# Patient Record
Sex: Female | Born: 1950 | Race: White | Hispanic: No | Marital: Single | Smoking: Former smoker
Health system: Southern US, Community
[De-identification: ages and names within clinical notes are randomized; demographics above are authoritative.]

## PROBLEM LIST (undated history)

## (undated) DIAGNOSIS — Z91199 Patient's noncompliance with other medical treatment and regimen due to unspecified reason: Secondary | ICD-10-CM

## (undated) DIAGNOSIS — D6851 Activated protein C resistance: Secondary | ICD-10-CM

## (undated) DIAGNOSIS — Z7901 Long term (current) use of anticoagulants: Secondary | ICD-10-CM

## (undated) DIAGNOSIS — S329XXA Fracture of unspecified parts of lumbosacral spine and pelvis, initial encounter for closed fracture: Secondary | ICD-10-CM

## (undated) DIAGNOSIS — Z720 Tobacco use: Secondary | ICD-10-CM

## (undated) DIAGNOSIS — Z9119 Patient's noncompliance with other medical treatment and regimen: Secondary | ICD-10-CM

## (undated) DIAGNOSIS — I2510 Atherosclerotic heart disease of native coronary artery without angina pectoris: Secondary | ICD-10-CM

## (undated) DIAGNOSIS — E039 Hypothyroidism, unspecified: Secondary | ICD-10-CM

## (undated) DIAGNOSIS — J449 Chronic obstructive pulmonary disease, unspecified: Secondary | ICD-10-CM

## (undated) DIAGNOSIS — J9610 Chronic respiratory failure, unspecified whether with hypoxia or hypercapnia: Secondary | ICD-10-CM

## (undated) DIAGNOSIS — I2699 Other pulmonary embolism without acute cor pulmonale: Secondary | ICD-10-CM

## (undated) DIAGNOSIS — E785 Hyperlipidemia, unspecified: Secondary | ICD-10-CM

## (undated) DIAGNOSIS — I219 Acute myocardial infarction, unspecified: Secondary | ICD-10-CM

## (undated) HISTORY — DX: Atherosclerotic heart disease of native coronary artery without angina pectoris: I25.10

## (undated) HISTORY — PX: COLONOSCOPY: SHX174

## (undated) HISTORY — PX: CORONARY ANGIOPLASTY: SHX604

## (undated) HISTORY — DX: Chronic obstructive pulmonary disease, unspecified: J44.9

## (undated) HISTORY — DX: Hyperlipidemia, unspecified: E78.5

## (undated) HISTORY — DX: Activated protein C resistance: D68.51

## (undated) HISTORY — DX: Tobacco use: Z72.0

## (undated) HISTORY — DX: Fracture of unspecified parts of lumbosacral spine and pelvis, initial encounter for closed fracture: S32.9XXA

## (undated) HISTORY — DX: Other pulmonary embolism without acute cor pulmonale: I26.99

## (undated) HISTORY — DX: Long term (current) use of anticoagulants: Z79.01

## (undated) HISTORY — DX: Hypothyroidism, unspecified: E03.9

---
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
## 1997-10-24 ENCOUNTER — Emergency Department (HOSPITAL_COMMUNITY): Admission: EM | Admit: 1997-10-24 | Discharge: 1997-10-24 | Payer: Self-pay | Admitting: Emergency Medicine

## 1997-11-03 ENCOUNTER — Encounter: Admission: RE | Admit: 1997-11-03 | Discharge: 1998-02-01 | Payer: Self-pay | Admitting: *Deleted

## 1999-06-01 ENCOUNTER — Encounter: Admission: RE | Admit: 1999-06-01 | Discharge: 1999-06-01 | Payer: Self-pay | Admitting: Urology

## 1999-06-01 ENCOUNTER — Encounter: Payer: Self-pay | Admitting: Urology

## 2000-01-10 DIAGNOSIS — I2510 Atherosclerotic heart disease of native coronary artery without angina pectoris: Secondary | ICD-10-CM

## 2000-01-10 HISTORY — DX: Atherosclerotic heart disease of native coronary artery without angina pectoris: I25.10

## 2000-12-24 ENCOUNTER — Encounter: Payer: Self-pay | Admitting: Emergency Medicine

## 2000-12-24 ENCOUNTER — Encounter: Payer: Self-pay | Admitting: Cardiology

## 2000-12-24 ENCOUNTER — Inpatient Hospital Stay (HOSPITAL_COMMUNITY): Admission: EM | Admit: 2000-12-24 | Discharge: 2000-12-27 | Payer: Self-pay | Admitting: Cardiology

## 2001-03-26 ENCOUNTER — Encounter: Admission: RE | Admit: 2001-03-26 | Discharge: 2001-06-24 | Payer: Self-pay | Admitting: Family Medicine

## 2001-05-24 ENCOUNTER — Inpatient Hospital Stay (HOSPITAL_COMMUNITY): Admission: AD | Admit: 2001-05-24 | Discharge: 2001-05-25 | Payer: Self-pay | Admitting: Cardiology

## 2001-05-28 ENCOUNTER — Ambulatory Visit (HOSPITAL_COMMUNITY): Admission: RE | Admit: 2001-05-28 | Discharge: 2001-05-29 | Payer: Self-pay | Admitting: *Deleted

## 2001-06-13 ENCOUNTER — Emergency Department (HOSPITAL_COMMUNITY): Admission: EM | Admit: 2001-06-13 | Discharge: 2001-06-13 | Payer: Self-pay | Admitting: *Deleted

## 2002-11-05 ENCOUNTER — Encounter: Admission: RE | Admit: 2002-11-05 | Discharge: 2002-11-05 | Payer: Self-pay | Admitting: Family Medicine

## 2003-01-01 ENCOUNTER — Ambulatory Visit (HOSPITAL_COMMUNITY): Admission: RE | Admit: 2003-01-01 | Discharge: 2003-01-01 | Payer: Self-pay | Admitting: *Deleted

## 2003-02-06 ENCOUNTER — Inpatient Hospital Stay (HOSPITAL_COMMUNITY): Admission: EM | Admit: 2003-02-06 | Discharge: 2003-02-09 | Payer: Self-pay | Admitting: Internal Medicine

## 2003-12-07 ENCOUNTER — Other Ambulatory Visit: Admission: RE | Admit: 2003-12-07 | Discharge: 2003-12-07 | Payer: Self-pay | Admitting: Family Medicine

## 2003-12-08 ENCOUNTER — Ambulatory Visit: Payer: Self-pay | Admitting: Cardiology

## 2004-01-10 DIAGNOSIS — I2699 Other pulmonary embolism without acute cor pulmonale: Secondary | ICD-10-CM

## 2004-01-10 DIAGNOSIS — D6851 Activated protein C resistance: Secondary | ICD-10-CM

## 2004-01-10 HISTORY — DX: Other pulmonary embolism without acute cor pulmonale: I26.99

## 2004-01-10 HISTORY — DX: Activated protein C resistance: D68.51

## 2004-04-21 ENCOUNTER — Ambulatory Visit: Payer: Self-pay

## 2004-05-31 ENCOUNTER — Ambulatory Visit: Payer: Self-pay | Admitting: Pulmonary Disease

## 2004-05-31 LAB — PULMONARY FUNCTION TEST

## 2004-06-15 ENCOUNTER — Inpatient Hospital Stay (HOSPITAL_COMMUNITY): Admission: EM | Admit: 2004-06-15 | Discharge: 2004-06-24 | Payer: Self-pay | Admitting: Emergency Medicine

## 2004-06-17 ENCOUNTER — Ambulatory Visit: Payer: Self-pay | Admitting: Pulmonary Disease

## 2004-06-17 ENCOUNTER — Encounter: Payer: Self-pay | Admitting: Cardiology

## 2004-06-17 ENCOUNTER — Ambulatory Visit: Payer: Self-pay | Admitting: Cardiology

## 2004-06-27 ENCOUNTER — Ambulatory Visit: Payer: Self-pay | Admitting: Cardiology

## 2004-07-04 ENCOUNTER — Ambulatory Visit: Payer: Self-pay | Admitting: Internal Medicine

## 2004-07-11 ENCOUNTER — Ambulatory Visit: Payer: Self-pay | Admitting: Pulmonary Disease

## 2004-07-11 ENCOUNTER — Ambulatory Visit: Payer: Self-pay | Admitting: Cardiology

## 2004-07-18 ENCOUNTER — Ambulatory Visit: Payer: Self-pay | Admitting: Cardiology

## 2004-08-08 ENCOUNTER — Ambulatory Visit: Payer: Self-pay | Admitting: Cardiology

## 2004-08-30 ENCOUNTER — Ambulatory Visit: Payer: Self-pay | Admitting: Cardiology

## 2004-08-30 ENCOUNTER — Ambulatory Visit: Payer: Self-pay | Admitting: Pulmonary Disease

## 2004-09-05 ENCOUNTER — Ambulatory Visit: Payer: Self-pay | Admitting: Cardiology

## 2004-09-15 ENCOUNTER — Ambulatory Visit: Payer: Self-pay | Admitting: Pulmonary Disease

## 2004-09-19 ENCOUNTER — Ambulatory Visit: Payer: Self-pay | Admitting: Cardiology

## 2004-10-19 ENCOUNTER — Ambulatory Visit: Payer: Self-pay | Admitting: Cardiology

## 2004-11-14 ENCOUNTER — Ambulatory Visit: Payer: Self-pay | Admitting: Cardiology

## 2004-12-05 ENCOUNTER — Ambulatory Visit: Payer: Self-pay | Admitting: Cardiology

## 2004-12-19 ENCOUNTER — Ambulatory Visit: Payer: Self-pay | Admitting: Cardiology

## 2005-01-10 ENCOUNTER — Ambulatory Visit: Payer: Self-pay | Admitting: Cardiology

## 2005-02-06 ENCOUNTER — Ambulatory Visit: Payer: Self-pay | Admitting: Cardiology

## 2005-02-06 ENCOUNTER — Ambulatory Visit: Payer: Self-pay | Admitting: Internal Medicine

## 2005-02-21 ENCOUNTER — Ambulatory Visit: Payer: Self-pay

## 2005-02-21 ENCOUNTER — Ambulatory Visit: Payer: Self-pay | Admitting: *Deleted

## 2005-03-13 ENCOUNTER — Ambulatory Visit: Payer: Self-pay | Admitting: Cardiology

## 2005-04-10 ENCOUNTER — Ambulatory Visit: Payer: Self-pay | Admitting: Cardiology

## 2005-04-10 ENCOUNTER — Ambulatory Visit: Payer: Self-pay

## 2005-04-24 ENCOUNTER — Ambulatory Visit: Payer: Self-pay | Admitting: Cardiology

## 2005-04-27 ENCOUNTER — Ambulatory Visit: Payer: Self-pay | Admitting: Internal Medicine

## 2005-05-22 ENCOUNTER — Ambulatory Visit: Payer: Self-pay | Admitting: Pulmonary Disease

## 2005-05-23 ENCOUNTER — Ambulatory Visit: Payer: Self-pay | Admitting: Cardiology

## 2005-06-19 ENCOUNTER — Ambulatory Visit: Payer: Self-pay | Admitting: Cardiology

## 2005-07-17 ENCOUNTER — Ambulatory Visit: Payer: Self-pay | Admitting: Cardiology

## 2005-08-14 ENCOUNTER — Ambulatory Visit: Payer: Self-pay | Admitting: Cardiology

## 2005-10-30 ENCOUNTER — Ambulatory Visit: Payer: Self-pay | Admitting: Cardiology

## 2005-11-06 ENCOUNTER — Ambulatory Visit: Payer: Self-pay | Admitting: Cardiology

## 2005-12-11 ENCOUNTER — Ambulatory Visit: Payer: Self-pay | Admitting: Cardiology

## 2005-12-12 ENCOUNTER — Ambulatory Visit: Payer: Self-pay | Admitting: Cardiology

## 2005-12-26 ENCOUNTER — Ambulatory Visit: Payer: Self-pay | Admitting: Cardiology

## 2006-02-05 ENCOUNTER — Ambulatory Visit: Payer: Self-pay | Admitting: Cardiology

## 2006-02-08 ENCOUNTER — Ambulatory Visit: Payer: Self-pay | Admitting: Pulmonary Disease

## 2006-03-05 ENCOUNTER — Ambulatory Visit: Payer: Self-pay | Admitting: Internal Medicine

## 2006-03-05 ENCOUNTER — Ambulatory Visit: Payer: Self-pay | Admitting: Cardiology

## 2006-03-05 LAB — CONVERTED CEMR LAB
ALT: 29 units/L (ref 0–40)
AST: 22 units/L (ref 0–37)
BUN: 20 mg/dL (ref 6–23)
Calcium: 9 mg/dL (ref 8.4–10.5)
Creatinine, Ser: 0.7 mg/dL (ref 0.4–1.2)
Potassium: 3.9 meq/L (ref 3.5–5.1)
TSH: 0.18 microintl units/mL — ABNORMAL LOW (ref 0.35–5.50)

## 2006-04-09 ENCOUNTER — Ambulatory Visit: Payer: Self-pay | Admitting: Internal Medicine

## 2006-04-09 ENCOUNTER — Ambulatory Visit: Payer: Self-pay | Admitting: Cardiology

## 2006-05-01 ENCOUNTER — Ambulatory Visit: Payer: Self-pay | Admitting: Cardiology

## 2006-05-02 ENCOUNTER — Ambulatory Visit: Payer: Self-pay | Admitting: Cardiology

## 2006-07-16 ENCOUNTER — Ambulatory Visit: Payer: Self-pay | Admitting: Pulmonary Disease

## 2006-07-17 ENCOUNTER — Ambulatory Visit: Payer: Self-pay | Admitting: Cardiology

## 2006-07-19 ENCOUNTER — Encounter: Payer: Self-pay | Admitting: Pulmonary Disease

## 2006-07-30 ENCOUNTER — Ambulatory Visit: Payer: Self-pay | Admitting: Cardiology

## 2006-08-07 ENCOUNTER — Ambulatory Visit: Payer: Self-pay | Admitting: Cardiology

## 2006-08-27 ENCOUNTER — Ambulatory Visit: Payer: Self-pay | Admitting: Cardiology

## 2006-08-27 ENCOUNTER — Ambulatory Visit: Payer: Self-pay | Admitting: Pulmonary Disease

## 2006-09-24 ENCOUNTER — Ambulatory Visit: Payer: Self-pay | Admitting: Internal Medicine

## 2006-09-24 ENCOUNTER — Ambulatory Visit: Payer: Self-pay | Admitting: Cardiology

## 2006-10-01 ENCOUNTER — Ambulatory Visit: Payer: Self-pay | Admitting: Cardiology

## 2006-10-01 ENCOUNTER — Ambulatory Visit: Payer: Self-pay | Admitting: Internal Medicine

## 2006-10-01 LAB — CONVERTED CEMR LAB
ALT: 25 units/L (ref 0–35)
AST: 19 units/L (ref 0–37)
BUN: 13 mg/dL (ref 6–23)
Chloride: 108 meq/L (ref 96–112)
Cholesterol: 272 mg/dL (ref 0–200)
Creatinine, Ser: 0.8 mg/dL (ref 0.4–1.2)
TSH: 2.4 microintl units/mL (ref 0.35–5.50)
VLDL: 23 mg/dL (ref 0–40)

## 2006-10-15 ENCOUNTER — Ambulatory Visit: Payer: Self-pay | Admitting: Cardiology

## 2006-11-12 ENCOUNTER — Ambulatory Visit: Payer: Self-pay | Admitting: Cardiology

## 2006-11-23 DIAGNOSIS — Z86711 Personal history of pulmonary embolism: Secondary | ICD-10-CM

## 2006-11-23 DIAGNOSIS — F17200 Nicotine dependence, unspecified, uncomplicated: Secondary | ICD-10-CM

## 2006-11-23 DIAGNOSIS — J439 Emphysema, unspecified: Secondary | ICD-10-CM | POA: Insufficient documentation

## 2006-11-23 DIAGNOSIS — E039 Hypothyroidism, unspecified: Secondary | ICD-10-CM | POA: Insufficient documentation

## 2006-11-26 ENCOUNTER — Ambulatory Visit: Payer: Self-pay | Admitting: Internal Medicine

## 2006-11-26 DIAGNOSIS — J441 Chronic obstructive pulmonary disease with (acute) exacerbation: Secondary | ICD-10-CM | POA: Insufficient documentation

## 2006-11-27 ENCOUNTER — Ambulatory Visit: Payer: Self-pay | Admitting: Cardiology

## 2006-12-10 ENCOUNTER — Ambulatory Visit: Payer: Self-pay | Admitting: Cardiology

## 2007-01-10 DIAGNOSIS — S329XXA Fracture of unspecified parts of lumbosacral spine and pelvis, initial encounter for closed fracture: Secondary | ICD-10-CM

## 2007-01-10 HISTORY — DX: Fracture of unspecified parts of lumbosacral spine and pelvis, initial encounter for closed fracture: S32.9XXA

## 2007-01-18 ENCOUNTER — Ambulatory Visit: Payer: Self-pay | Admitting: Cardiology

## 2007-02-14 ENCOUNTER — Ambulatory Visit: Payer: Self-pay | Admitting: Cardiology

## 2007-03-12 ENCOUNTER — Emergency Department (HOSPITAL_COMMUNITY): Admission: EM | Admit: 2007-03-12 | Discharge: 2007-03-12 | Payer: Self-pay | Admitting: Emergency Medicine

## 2007-03-13 ENCOUNTER — Emergency Department (HOSPITAL_COMMUNITY): Admission: EM | Admit: 2007-03-13 | Discharge: 2007-03-13 | Payer: Self-pay | Admitting: Emergency Medicine

## 2007-04-22 ENCOUNTER — Ambulatory Visit: Payer: Self-pay | Admitting: Cardiology

## 2007-05-01 ENCOUNTER — Ambulatory Visit: Payer: Self-pay | Admitting: Cardiology

## 2007-06-18 ENCOUNTER — Ambulatory Visit: Payer: Self-pay | Admitting: Cardiology

## 2007-07-16 ENCOUNTER — Ambulatory Visit: Payer: Self-pay | Admitting: Cardiology

## 2007-10-08 ENCOUNTER — Ambulatory Visit: Payer: Self-pay | Admitting: Cardiology

## 2007-11-12 ENCOUNTER — Ambulatory Visit: Payer: Self-pay | Admitting: Cardiology

## 2008-01-17 ENCOUNTER — Ambulatory Visit: Payer: Self-pay | Admitting: Cardiology

## 2008-02-18 ENCOUNTER — Ambulatory Visit: Payer: Self-pay | Admitting: Cardiology

## 2008-03-20 ENCOUNTER — Ambulatory Visit: Payer: Self-pay | Admitting: Cardiology

## 2008-03-31 ENCOUNTER — Telehealth (INDEPENDENT_AMBULATORY_CARE_PROVIDER_SITE_OTHER): Payer: Self-pay | Admitting: *Deleted

## 2008-04-01 ENCOUNTER — Ambulatory Visit: Payer: Self-pay | Admitting: Cardiology

## 2008-04-01 ENCOUNTER — Encounter: Payer: Self-pay | Admitting: Cardiology

## 2008-04-14 ENCOUNTER — Ambulatory Visit: Payer: Self-pay | Admitting: Cardiology

## 2008-05-12 ENCOUNTER — Ambulatory Visit: Payer: Self-pay | Admitting: Cardiology

## 2008-05-12 ENCOUNTER — Encounter (INDEPENDENT_AMBULATORY_CARE_PROVIDER_SITE_OTHER): Payer: Self-pay | Admitting: Cardiology

## 2008-05-27 LAB — CONVERTED CEMR LAB
ALT: 21 units/L (ref 0–35)
AST: 18 units/L (ref 0–37)
Albumin: 3.4 g/dL — ABNORMAL LOW (ref 3.5–5.2)
Alkaline Phosphatase: 107 units/L (ref 39–117)
Cholesterol: 223 mg/dL — ABNORMAL HIGH (ref 0–200)
Total Protein: 6.7 g/dL (ref 6.0–8.3)

## 2008-08-24 ENCOUNTER — Encounter: Payer: Self-pay | Admitting: *Deleted

## 2008-10-05 ENCOUNTER — Encounter (INDEPENDENT_AMBULATORY_CARE_PROVIDER_SITE_OTHER): Payer: Self-pay | Admitting: Cardiology

## 2009-04-13 ENCOUNTER — Ambulatory Visit: Payer: Self-pay | Admitting: Internal Medicine

## 2009-04-13 ENCOUNTER — Inpatient Hospital Stay (HOSPITAL_COMMUNITY): Admission: RE | Admit: 2009-04-13 | Discharge: 2009-04-17 | Payer: Self-pay | Admitting: Cardiovascular Disease

## 2009-04-13 ENCOUNTER — Encounter: Payer: Self-pay | Admitting: Emergency Medicine

## 2009-04-13 ENCOUNTER — Encounter: Payer: Self-pay | Admitting: Cardiology

## 2009-04-15 ENCOUNTER — Encounter: Payer: Self-pay | Admitting: Cardiology

## 2009-04-16 ENCOUNTER — Encounter: Payer: Self-pay | Admitting: Cardiology

## 2009-04-19 ENCOUNTER — Encounter: Payer: Self-pay | Admitting: Cardiology

## 2009-04-19 ENCOUNTER — Emergency Department (HOSPITAL_COMMUNITY): Admission: EM | Admit: 2009-04-19 | Discharge: 2009-04-20 | Payer: Self-pay | Admitting: Emergency Medicine

## 2009-04-20 ENCOUNTER — Emergency Department (HOSPITAL_COMMUNITY): Admission: EM | Admit: 2009-04-20 | Discharge: 2009-04-20 | Payer: Self-pay | Admitting: Emergency Medicine

## 2009-04-23 ENCOUNTER — Ambulatory Visit: Payer: Self-pay | Admitting: Cardiology

## 2009-04-28 ENCOUNTER — Ambulatory Visit: Payer: Self-pay | Admitting: Cardiology

## 2009-05-06 ENCOUNTER — Ambulatory Visit: Payer: Self-pay | Admitting: Cardiology

## 2009-05-06 DIAGNOSIS — R42 Dizziness and giddiness: Secondary | ICD-10-CM | POA: Insufficient documentation

## 2009-05-12 ENCOUNTER — Encounter (INDEPENDENT_AMBULATORY_CARE_PROVIDER_SITE_OTHER): Payer: Self-pay | Admitting: *Deleted

## 2009-05-19 ENCOUNTER — Ambulatory Visit: Payer: Self-pay | Admitting: Cardiology

## 2009-05-21 ENCOUNTER — Encounter: Payer: Self-pay | Admitting: Cardiology

## 2009-05-28 ENCOUNTER — Ambulatory Visit: Payer: Self-pay | Admitting: Cardiology

## 2009-05-31 ENCOUNTER — Encounter (HOSPITAL_COMMUNITY): Admission: RE | Admit: 2009-05-31 | Discharge: 2009-06-30 | Payer: Self-pay | Admitting: Cardiology

## 2009-06-03 ENCOUNTER — Ambulatory Visit: Payer: Self-pay | Admitting: Cardiology

## 2009-06-04 ENCOUNTER — Encounter: Payer: Self-pay | Admitting: Cardiology

## 2009-06-08 ENCOUNTER — Encounter: Payer: Self-pay | Admitting: Cardiology

## 2009-06-09 ENCOUNTER — Ambulatory Visit: Payer: Self-pay | Admitting: Cardiology

## 2009-06-09 ENCOUNTER — Encounter: Payer: Self-pay | Admitting: Cardiology

## 2009-06-09 DIAGNOSIS — R269 Unspecified abnormalities of gait and mobility: Secondary | ICD-10-CM

## 2009-06-15 ENCOUNTER — Encounter: Payer: Self-pay | Admitting: Cardiology

## 2009-06-15 DIAGNOSIS — R079 Chest pain, unspecified: Secondary | ICD-10-CM

## 2009-06-16 ENCOUNTER — Ambulatory Visit: Payer: Self-pay | Admitting: Cardiology

## 2009-06-16 ENCOUNTER — Encounter: Payer: Self-pay | Admitting: Cardiology

## 2009-06-17 ENCOUNTER — Ambulatory Visit: Payer: Self-pay | Admitting: Cardiology

## 2009-06-18 ENCOUNTER — Encounter: Payer: Self-pay | Admitting: Cardiology

## 2009-06-22 ENCOUNTER — Encounter (HOSPITAL_COMMUNITY): Admission: RE | Admit: 2009-06-22 | Discharge: 2009-07-22 | Payer: Self-pay | Admitting: Cardiology

## 2009-06-22 ENCOUNTER — Encounter: Payer: Self-pay | Admitting: Cardiology

## 2009-06-24 ENCOUNTER — Ambulatory Visit: Payer: Self-pay | Admitting: Cardiology

## 2009-07-05 ENCOUNTER — Ambulatory Visit: Payer: Self-pay | Admitting: Cardiology

## 2009-07-05 LAB — CONVERTED CEMR LAB: POC INR: 3.5

## 2009-07-15 ENCOUNTER — Encounter: Payer: Self-pay | Admitting: Cardiology

## 2009-07-19 ENCOUNTER — Encounter: Payer: Self-pay | Admitting: Cardiology

## 2009-07-19 ENCOUNTER — Ambulatory Visit: Payer: Self-pay | Admitting: Cardiology

## 2009-07-19 LAB — CONVERTED CEMR LAB: POC INR: 2

## 2009-08-16 ENCOUNTER — Telehealth (INDEPENDENT_AMBULATORY_CARE_PROVIDER_SITE_OTHER): Payer: Self-pay | Admitting: *Deleted

## 2009-08-18 ENCOUNTER — Ambulatory Visit: Payer: Self-pay | Admitting: Cardiology

## 2009-08-24 ENCOUNTER — Encounter: Payer: Self-pay | Admitting: Cardiology

## 2009-09-15 ENCOUNTER — Ambulatory Visit: Payer: Self-pay | Admitting: Cardiology

## 2009-09-15 LAB — CONVERTED CEMR LAB: POC INR: 2.2

## 2009-10-13 ENCOUNTER — Ambulatory Visit: Payer: Self-pay | Admitting: Cardiology

## 2009-10-13 LAB — CONVERTED CEMR LAB: POC INR: 1.7

## 2009-10-27 ENCOUNTER — Ambulatory Visit: Payer: Self-pay | Admitting: Cardiology

## 2009-11-16 ENCOUNTER — Encounter (INDEPENDENT_AMBULATORY_CARE_PROVIDER_SITE_OTHER): Payer: Self-pay | Admitting: *Deleted

## 2009-11-23 ENCOUNTER — Encounter: Payer: Self-pay | Admitting: Cardiology

## 2009-11-24 ENCOUNTER — Ambulatory Visit: Payer: Self-pay | Admitting: Cardiology

## 2009-11-30 ENCOUNTER — Ambulatory Visit (HOSPITAL_COMMUNITY): Admission: RE | Admit: 2009-11-30 | Discharge: 2009-11-30 | Payer: Self-pay | Admitting: Gastroenterology

## 2009-12-06 ENCOUNTER — Ambulatory Visit: Payer: Self-pay | Admitting: Cardiology

## 2009-12-22 ENCOUNTER — Ambulatory Visit (HOSPITAL_COMMUNITY)
Admission: RE | Admit: 2009-12-22 | Discharge: 2009-12-22 | Payer: Self-pay | Source: Home / Self Care | Attending: Gastroenterology | Admitting: Gastroenterology

## 2009-12-29 ENCOUNTER — Ambulatory Visit: Payer: Self-pay | Admitting: Cardiology

## 2009-12-29 LAB — CONVERTED CEMR LAB: POC INR: 1.8

## 2009-12-31 ENCOUNTER — Ambulatory Visit: Payer: Self-pay | Admitting: Cardiology

## 2010-01-13 ENCOUNTER — Telehealth (INDEPENDENT_AMBULATORY_CARE_PROVIDER_SITE_OTHER): Payer: Self-pay | Admitting: *Deleted

## 2010-01-17 ENCOUNTER — Encounter: Payer: Self-pay | Admitting: Cardiology

## 2010-01-17 ENCOUNTER — Ambulatory Visit: Admission: RE | Admit: 2010-01-17 | Discharge: 2010-01-17 | Payer: Self-pay | Source: Home / Self Care

## 2010-01-18 ENCOUNTER — Encounter: Payer: Self-pay | Admitting: Cardiology

## 2010-02-07 ENCOUNTER — Ambulatory Visit: Admission: RE | Admit: 2010-02-07 | Discharge: 2010-02-07 | Payer: Self-pay | Source: Home / Self Care

## 2010-02-07 LAB — CONVERTED CEMR LAB: POC INR: 1.9

## 2010-02-10 NOTE — Assessment & Plan Note (Signed)
Summary: ORTHOSTATICS-JM  Nurse Visit   Vital Signs:  Patient profile:   60 year old female Pulse (ortho):   101 / minute BP standing:   147 / 74  Vitals Entered By: Hoover Brunette, LPN (Jun 03, 2009 9:32 AM)  Serial Vital Signs/Assessments:  Time      Position  BP       Pulse  Resp  Temp     By 9:44 AM   Lying RA  120/78   81                    [facility], LPN 1:61 AM   Sitting   122/82   91                    [address], LPN 0:96 AM   Standing  147/74   101                   [place], LPN  Comments: 0:45 AM After 2 min:  114/62  97 After 3 min:  108/79  142 After 5 min. at rest:  123/78  85 States her dizziness  and SOB happens almost all the time.  States she has been drinking G2 and started the Florinef.   By: Hoover Brunette, LPN    Allergies: 1)  ! Minocycline Hcl (Minocycline Hcl) 2)  ! Chantix (Varenicline Tartrate) Prescriptions: ATENOLOL 25 MG TABS (ATENOLOL) Take 1 tablet by mouth twice a day  #60 x 6   Entered by:   Carlye Grippe   Authorized by:   Lewayne Bunting, MD, [facility]   Signed by:   Carlye Grippe on 06/09/2009   Method used:   Electronically to        [facility]  [address] (retail)       [address]       [place], [place]  [address]       Ph: [phone]       Fax: [phone]   RxID:   [id number]  Try to increase her atenolol to 25mg  by mouth two times a day Lewayne Bunting, MD, [facility]  Jun 03, 2009 11:48 AM tried calling patient several times on cell number and unable to reach patient. called Cardiac Rehab dept at [facility],spoke with Nancy,left message with her to have patient call office.Carlye Grippe  June 09, 2009 10:35 AM Patient informed of the above.med called to [facility] [place].Carlye Grippe  June 09, 2009 11:57 AM

## 2010-02-10 NOTE — Miscellaneous (Signed)
Summary: Orders Update - lexiscan  Clinical Lists Changes  Problems: Added new problem of CHEST PAIN (ICD-786.50) Orders: Added new Referral order of Nuclear Med (Nuc Med) - Signed

## 2010-02-10 NOTE — Medication Information (Signed)
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 no    Recent/future dental: no  Any missed doses?: no       Is patient compliant with meds? yes       Allergies: 1)  ! Minocycline Hcl (Minocycline Hcl) 2)  ! Chantix (Varenicline Tartrate)  Anticoagulation Management History:      The patient is taking warfarin and comes in today for a routine follow up visit.  Negative risk factors for bleeding include an age less than 29 years old.  The bleeding index is 'low risk'.  Negative CHADS2 values include Age > 70 years old.  The start date was 04/24/2005.  Anticoagulation responsible provider: Dietrich Pates MD, Molly Maduro.  INR POC: 2.0.  Cuvette Lot#: [id number].    Anticoagulation Management Assessment/Plan:      The patient's current anticoagulation dose is Warfarin sodium 5 mg tabs: Use as directed by Anticoagulation Clinic.  The target INR is 2.0-2.5.  The next INR is due 08/18/2009.  Anticoagulation instructions were given to patient.  Results were reviewed/authorized by [name] Hey, RN.  She was notified by [name] Hey RN.         Prior Anticoagulation Instructions: INR 3.5 Hold coumadin tomorrow, take 1 tablet on Wednesday then resume 1 1/2 tablets once daily except 1 tablet on Mondays and Thursdays  Current Anticoagulation Instructions: INR 2.0 Continue coumadin 7.5mg  once daily except 5mg  on Mondays and Thursdays

## 2010-02-10 NOTE — Medication Information (Signed)
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 no    Recent/future dental: no  Any missed doses?: no       Is patient compliant with meds? yes       Allergies: 1)  ! Minocycline Hcl (Minocycline Hcl) 2)  ! Chantix (Varenicline Tartrate)  Anticoagulation Management History:      The patient is taking warfarin and comes in today for a routine follow up visit.  Negative risk factors for bleeding include an age less than 87 years old.  The bleeding index is 'low risk'.  Negative CHADS2 values include Age > 27 years old.  The start date was 04/24/2005.  Anticoagulation responsible Uzma Hellmer: Diona Browner MD, Remi Deter.  INR POC: 2.0.  Cuvette Lot#: [id number].    Anticoagulation Management Assessment/Plan:      The patient's current anticoagulation dose is Warfarin sodium 5 mg tabs: Use as directed by Anticoagulation Clinic.  The target INR is 2.0-2.5.  The next INR is due 12/29/2009.  Anticoagulation instructions were given to patient.  Results were reviewed/authorized by Vashti Hey, RN.  She was notified by Vashti Hey RN.         Prior Anticoagulation Instructions: INR 4.9 Pt got last coumadin Rx from [facility] and they gave her 7.5mg  tablets.  She has been taking the same number of tablets using the 7.5mg  tablet.  Pt has taken coumadin today. Hold coumadin Thursday and Friday night then take coumadin 7.5mg  once daily except 3.75mg  on Saturdays  Current Anticoagulation Instructions: INR 2.0 Continue coumadin 7.5mg  once daily except 3.75mg  on Saturdays

## 2010-02-10 NOTE — Progress Notes (Signed)
Summary: Plavix   Phone Note From Other Clinic   Caller: [name], [facility] Summary of Call: [name] from [employer] called stating [name]'s Plavix was in the office in [place]. She will send by interoffice mail to [place]. Initial call taken by: Cyril Loosen, RN, BSN,  August 16, 2009 2:04 PM

## 2010-02-10 NOTE — Medication Information (Signed)
Summary: RX Folder/ PICKED UP PLAVIX  RX Folder/ PICKED UP PLAVIX   Imported By: Dorise Hiss 11/23/2009 10:56:55  _____________________________________________________________________  External Attachment:    Type:   Image     Comment:   External Document

## 2010-02-10 NOTE — Medication Information (Signed)
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 no    Recent/future dental: no  Any missed doses?: yes     Details:  missed 1- 2  doses  this weekend  Is patient compliant with meds? yes       Allergies: 1)  ! Minocycline Hcl (Minocycline Hcl) 2)  ! Chantix (Varenicline Tartrate)  Anticoagulation Management History:      The patient is taking warfarin and comes in today for a routine follow up visit.  Negative risk factors for bleeding include an age less than 74 years old.  The bleeding index is 'low risk'.  Negative CHADS2 values include Age > 26 years old.  The start date was 04/24/2005.  Anticoagulation responsible provider: Dietrich Pates MD, Molly Maduro.  INR POC: 1.6.  Cuvette Lot#: [id number].    Anticoagulation Management Assessment/Plan:      The patient's current anticoagulation dose is Warfarin sodium 5 mg tabs: Use as directed by Anticoagulation Clinic.  The target INR is 2.0-2.5.  The next INR is due 06/24/2009.  Anticoagulation instructions were given to patient.  Results were reviewed/authorized by Vashti Hey, RN.  She was notified by Vashti Hey RN.         Prior Anticoagulation Instructions: INR 2.1 Continue coumadin 7.5mg  once daily   Current Anticoagulation Instructions: INR 1.6 Take coumadin 2 1/2 tablets tonight and tomorrow night then resume 1 1/2 tablets once daily

## 2010-02-10 NOTE — Letter (Signed)
Summary: CARDIAC REHAB - OFFICE NOTE  CARDIAC REHAB - OFFICE NOTE   Imported By: Claudette Laws 06/04/2009 14:14:01  _____________________________________________________________________  External Attachment:    Type:   Image     Comment:   External Document  Appended Document: CARDIAC REHAB - OFFICE NOTE Agree. Please order PT firstand put rehab on hold.   Appended Document: CARDIAC REHAB - OFFICE NOTE faxed response to [facility] cardiac rehab.

## 2010-02-10 NOTE — Medication Information (Signed)
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 no    Recent/future dental: no  Any missed doses?: no       Is patient compliant with meds? yes       Allergies: 1)  ! Minocycline Hcl (Minocycline Hcl) 2)  ! Chantix (Varenicline Tartrate)  Anticoagulation Management History:      The patient is taking warfarin and comes in today for a routine follow up visit.  Negative risk factors for bleeding include an age less than 72 years old.  The bleeding index is 'low risk'.  Negative CHADS2 values include Age > 87 years old.  The start date was 04/24/2005.  Anticoagulation responsible provider: Diona Browner MD, Remi Deter.  INR POC: 1.7.  Cuvette Lot#: [id number].    Anticoagulation Management Assessment/Plan:      The patient's current anticoagulation dose is Warfarin sodium 5 mg tabs: Use as directed by Anticoagulation Clinic.  The target INR is 2.0-2.5.  The next INR is due 10/27/2009.  Anticoagulation instructions were given to patient.  Results were reviewed/authorized by Vashti Hey, RN.  She was notified by Vashti Hey RN.         Prior Anticoagulation Instructions: INR 2.2 Continue coumadin 7.5mg  once daily except 5mg  on Mondays and Thursdays  Current Anticoagulation Instructions: INR 1.7 Take coumadin 10mg  tonight and tomorrow night then resume 7.5mg  once daily except 5mg  on Mondays and Thursdays

## 2010-02-10 NOTE — Letter (Signed)
Summary: Engineer, materials at [facility]  [address]   [place], [place] [address]   Phone: [phone]  Fax: [phone]        May 12, 2009 MRN: [id number]   [name] [address] [place], [place]  [address]   Dear [name],  Your test ordered by Selena Batten has been reviewed by your physician (or physician assistant) and was found to be normal or stable. Your physician (or physician assistant) felt no changes were needed at this time.  ____ Echocardiogram  ____ Cardiac Stress Test  ____ Lab Work  ____ Peripheral vascular study of arms, legs or neck  __X__ CT scan or X-ray - head CT normal  ____ Lung or Breathing test  ____ Other:   Thank you.   Hoover Brunette, LPN    Duane [place], M.D., F.A.C.C. Thressa Sheller, M.D., F.A.C.C. Oneal Grout, M.D., F.A.C.C. Cheree Ditto, M.D., F.A.C.C. Daiva Nakayama, M.D., F.A.C.C. Kenney Houseman, M.D., F.A.C.C. Jeanne Ivan, PA-C

## 2010-02-10 NOTE — Miscellaneous (Signed)
Summary: Rehab Report/FAXED CARDIAC REHAB PROGRAM  Rehab Report/FAXED CARDIAC REHAB PROGRAM   Imported By: Dorise Hiss 06/08/2009 14:46:55  _____________________________________________________________________  External Attachment:    Type:   Image     Comment:   External Document

## 2010-02-10 NOTE — Medication Information (Signed)
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 no    Recent/future dental: no  Any missed doses?: no       Is patient compliant with meds? yes       Allergies: 1)  ! Minocycline Hcl (Minocycline Hcl) 2)  ! Chantix (Varenicline Tartrate)  Anticoagulation Management History:      The patient is taking warfarin and comes in today for a routine follow up visit.  Negative risk factors for bleeding include an age less than 52 years old.  The bleeding index is 'low risk'.  Negative CHADS2 values include Age > 56 years old.  The start date was 04/24/2005.  Anticoagulation responsible Sibley Rolison: Diona Browner MD, Remi Deter.  INR POC: 2.0.  Cuvette Lot#: [id number].    Anticoagulation Management Assessment/Plan:      The patient's current anticoagulation dose is Warfarin sodium 5 mg tabs: Use as directed by Anticoagulation Clinic.  The target INR is 2.0-2.5.  The next INR is due 11/24/2009.  Anticoagulation instructions were given to patient.  Results were reviewed/authorized by Vashti Hey, RN.  She was notified by Vashti Hey RN.         Prior Anticoagulation Instructions: INR 1.7 Take coumadin 10mg  tonight and tomorrow night then resume 7.5mg  once daily except 5mg  on Mondays and Thursdays  Current Anticoagulation Instructions: INR 2.0 Continue coumadin 7.5mg  once daily except 5mg  on Mondays and Thursdays

## 2010-02-10 NOTE — Medication Information (Signed)
Summary: RX Folder/ PICKED UP PLAVIX  RX Folder/ PICKED UP PLAVIX   Imported By: Dorise Hiss 06/01/2009 12:27:15  _____________________________________________________________________  External Attachment:    Type:   Image     Comment:   External Document

## 2010-02-10 NOTE — Assessment & Plan Note (Signed)
Summary: 6 mof u per dec reminder-srs  Medications Added ATENOLOL 25 MG TABS (ATENOLOL) take 2 tabs (50mg ) every morning & one tab (25mg ) CRESTOR 10 MG TABS (ROSUVASTATIN CALCIUM) Take 1 tab by mouth at bedtime NITROSTAT 0.4 MG SUBL (NITROGLYCERIN) 1 tablet under tongue at onset of chest pain; you may repeat every 5 minutes for up to 3 doses.        Visit Type:  Follow-up Primary Provider:  [facility]   History of Present Illness: the patient is a 60 year old female with a history of coronary artery disease, status post stent placement to the right ostial coronary artery initially in December of 2002.  The patient has undergone several procedures for in-stent restenosis her most recent one was placement of a drug-eluting stents to the right coronary ostium.  A follow Cardiolitei stress imaging study was within normal limits.the patient is also been diagnosed with diabetes mellitus.  She seen in the free clinic where she receives blood work.    She is actually been doing well from a cardiac asker perspective.  She denies any chest pain.she does have significant shortness of breath and has significant COPD.  Unfortunately she continues to smoke.  Preventive Screening-Counseling & Management  Alcohol-Tobacco     Smoking Status: current     Packs/Day: 0.5  Comments: smoked for about 40 years  Current Medications (verified): 1)  Advair Diskus 250-50 Mcg/dose Misc (Fluticasone-Salmeterol) .... Inhale 1 Puff As Directed Twice A Day 2)  Atenolol 25 Mg Tabs (Atenolol) .... Take 2 Tabs (50mg ) Every Morning & One Tab (25mg ) 3)  Levothyroxine Sodium 100 Mcg Tabs (Levothyroxine Sodium) .... Take 1 Tablet By Mouth Once A Day 4)  Crestor 10 Mg Tabs (Rosuvastatin Calcium) .... Take 1 Tab By Mouth At Bedtime 5)  Spiriva Handihaler 18 Mcg Caps (Tiotropium Bromide Monohydrate) .... Inhale 1 Capsule As Directed Once  A Day 6)  Albuterol 90 Mcg/act  Aers (Albuterol) .... As Needed 7)   Nitrostat 0.4 Mg Subl (Nitroglycerin) .[name].. 1 Tablet Under Tongue At Onset of Chest Pain; You May Repeat Every 5 Minutes For Up To 3 Doses. 8)  Warfarin Sodium 5 Mg Tabs (Warfarin Sodium) .... Use As Directed By Anticoagulation Clinic 9)  Plavix 75 Mg Tabs (Clopidogrel Bisulfate) .... Take 1 Tablet By Mouth Once A Day 10)  Imdur 60 Mg Xr24h-Tab (Isosorbide Mononitrate) .... Take 1 1/2 Tabs (90mg ) Daily  Allergies: 1)  ! Minocycline Hcl (Minocycline Hcl) 2)  ! Chantix (Varenicline Tartrate) 3)  ! Prednisone  Comments:  Nurse/Medical Assistant: The patient's medications and allergies were reviewed with the patient and were updated in the Medication and Allergy Lists. Pt verbally confirmed medications.  Cyril Loosen, RN, BSN (December 31, 2009 1:26 PM)  Past History:  Past Surgical History: Last updated: 03/25/2008 Unremarkable  Family History: Last updated: 03/25/2008 Family History of Coronary Artery Disease:   Social History: Last updated: 03/25/2008 Still works as Naval architect. Tobacco Use - Yes.   Risk Factors: Smoking Status: current (12/31/2009) Packs/Day: 0.5 (12/31/2009)  Past Medical History: Reviewed history from 05/06/2009 and no changes required.  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 B.  status post non-ST elevation myocardial infarction/stenting ostial right coronary artery, December 2002. History of preserved left ventricular function  C. acute inferior wall myocardial infarction with stenting of a 99% stenosis of the ostium of the right coronary artery with a drug-eluting stent. April 2011 Hx of  PULMONARY EMBOLISM (ICD-415.19) History of deep venous thrombosis History of Factor V Leiden gene mutation SYSTEMIC LUPUS ERYTHEMATOSUS, HX OF (ICD-V12.2) Obstructive sleep apnea  Social History: Packs/Day:  0.5  Review of Systems       The patient complains of shortness of breath, prolonged cough, and wheezing.  The patient denies fatigue, malaise, fever, weight gain/loss, vision loss, decreased hearing, hoarseness, chest pain, palpitations, sleep apnea, coughing up blood, abdominal pain, blood in stool, nausea, vomiting, diarrhea, heartburn, incontinence, blood in urine, muscle weakness, joint pain, leg swelling, rash, skin lesions, headache, fainting, dizziness, depression, anxiety, enlarged lymph nodes, easy bruising or bleeding, and environmental allergies.    Vital Signs:  Patient profile:   60 year old female Height:      66 inches Weight:      214 pounds BMI:     34.67 Pulse rate:   101 / minute BP sitting:   96 / 63  (left arm) Cuff size:   large  Vitals Entered By: Cyril Loosen, RN, BSN (December 31, 2009 1:21 PM)  Nutrition Counseling: Patient's BMI is greater than 25 and therefore counseled on weight management options. Comments Pt was on dose pack of prednisone. She d/c'd on her own yesterday d/t feeling like her throat was swelling up. She states she was told at the free clinic that it looked like she had pneumonia.    Physical Exam  Additional Exam:  General: Well-developed, well-nourished in no distress head: Normocephalic and atraumatic eyes PERRLA/EOMI intact, conjunctiva and lids normal nose: No deformity or lesions mouth normal dentition, normal posterior pharynx neck: Supple, no JVD.  No masses, thyromegaly or abnormal cervical nodes lungs: decreased breath sounds bilaterally with faint wheezes. heart: regular rate and rhythm with normal S1 and S2, no S3 or S4.  PMI is normal.  No pathological murmurs abdomen: Normal bowel sounds, abdomen is soft and  nontender without masses, organomegaly or hernias noted.  No hepatosplenomegaly musculoskeletal: Difficulty walking, uses a cane. 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  This is now largely contributing to her shortness of breath. Her updated medication list for this problem includes:    Advair Diskus 250-50 Mcg/dose Misc (Fluticasone-salmeterol) ..... Inhale 1 puff as directed twice a day    Spiriva Handihaler 18 Mcg Caps (Tiotropium bromide monohydrate) ..... Inhale 1 capsule as directed once  a day    Albuterol 90 Mcg/act Aers (Albuterol) .[name]... As needed  Problem # 2:  CORONARY ARTERY DISEASE (ICD-414.00) the patient has a relative tachycardia.  I increased her atenolol to 50 mg in the morning and 25 mg in the evening.the patient has had a drug-eluting stent placed earlier this year to the ostium of the right coronary artery.  She had repeated in-stent restenosis in the same location.  She will likely need to be on Plavix indefinitely.  She also needs to be on Coumadin for hypercoagulable state.  She can remain off aspirin for now.  In April 2012 we could potentially reconsider stopping Plavix  and restart a combination of aspirin and Coumadin but she will be at high risk for in-stent restenosis.  We will make a decision during that clinic visit.the patient also needs to be on a statin and I started Crestor 10 mg p.o. daily Her updated medication list for this problem includes:    Atenolol 25 Mg Tabs (Atenolol) .[name]... Take 2 tabs (50mg ) every morning & one tab (25mg )    Nitrostat 0.4 Mg Subl (Nitroglycerin) .[name]... 1  tablet under tongue at onset of chest pain; you may repeat every 5 minutes for up to 3 doses.    Warfarin Sodium 5 Mg Tabs (Warfarin sodium) ..... Use as directed by anticoagulation clinic    Plavix 75 Mg Tabs (Clopidogrel bisulfate) .[name]... Take 1 tablet by mouth once a day    Imdur 60 Mg Xr24h-tab (Isosorbide mononitrate) .[name]... Take 1 1/2 tabs (90mg ) daily  Problem # 3:  Hx of PULMONARY EMBOLISM (ICD-415.19) the patient has a history of hypercoagulable state with factor V Leiden.she's currently on combination therapy with warfarin as well as Plavix. Her updated medication list for this problem includes:    Warfarin Sodium 5 Mg Tabs (Warfarin sodium) ..... Use as directed by anticoagulation clinic    Plavix 75 Mg Tabs (Clopidogrel bisulfate) .[name]... Take 1 tablet by mouth once a day  Problem # 4:  TOBACCO ABUSE (ICD-305.1) I counseled the patient regarding her tobacco use.  Patient Instructions: 1)  Stop Lovastatin  2)  Begin Crestor 10mg  daily 3)  Increase Atenolol to 50mg  every morning & 25mg  every evening 4)  Follow up in  6 months Prescriptions: ATENOLOL 25 MG TABS (ATENOLOL) take 2 tabs (50mg ) every morning & one tab (25mg )  #90 x 6   Entered by:   Hoover Brunette, LPN   Authorized by:   Lewayne Bunting, MD, [facility]   Signed by:   Hoover Brunette, LPN on 54/09/8117   Method used:   Electronically to        [facility]  [address] (retail)       [address]       [place], [place]  [address]       Ph: [phone]       Fax: [phone]   RxID:   [id number] CRESTOR 10 MG TABS (ROSUVASTATIN CALCIUM) Take 1 tab by mouth at bedtime  #30 x 6   Entered by:   Hoover Brunette, LPN   Authorized by:   Lewayne Bunting, MD, [facility]   Signed by:   Hoover Brunette, LPN on 64/40/3474   Method used:   Electronically to        [facility]  [address] (retail)       [address]       [place], [place]  [address]       Ph: [phone]       Fax: [phone]   RxID:   [id number]    Handout requested.

## 2010-02-10 NOTE — Medication Information (Signed)
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 no    Recent/future dental: no  Any missed doses?: no       Is patient compliant with meds? yes       Allergies: 1)  ! Minocycline Hcl (Minocycline Hcl) 2)  ! Chantix (Varenicline Tartrate)  Anticoagulation Management History:      The patient is taking warfarin and comes in today for a routine follow up visit.  Negative risk factors for bleeding include an age less than 78 years old.  The bleeding index is 'low risk'.  Negative CHADS2 values include Age > 55 years old.  The start date was 04/24/2005.  INR POC: 2.5.  Cuvette Lot#: [id number].    Anticoagulation Management Assessment/Plan:      The patient's current anticoagulation dose is Warfarin sodium 5 mg tabs: Use as directed by Anticoagulation Clinic.  The target INR is 2 - 3.  The next INR is due 05/12/2009.  Anticoagulation instructions were given to patient.  Results were reviewed/authorized by Vashti Hey, RN.  She was notified by Vashti Hey RN.         Prior Anticoagulation Instructions: INR 3.1 Take coumadin 7.5mg  once daily   Current Anticoagulation Instructions: INR 2.5 Continue coumadin 7.5mg  once daily

## 2010-02-10 NOTE — Medication Information (Signed)
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 yes       Details: on amoxicillin and clarythromyacin for chest infection x 10 days  Finishs 6/25   Recent/future dental: no  Any missed doses?: no       Is patient compliant with meds? yes       Allergies: 1)  ! Minocycline Hcl (Minocycline Hcl) 2)  ! Chantix (Varenicline Tartrate)  Anticoagulation Management History:      Negative risk factors for bleeding include an age less than 77 years old.  The bleeding index is 'low risk'.  Negative CHADS2 values include Age > 40 years old.  The start date was 04/24/2005.  Anticoagulation responsible provider: Dietrich Pates MD, Molly Maduro.  INR POC: 2.6.    Anticoagulation Management Assessment/Plan:      The patient's current anticoagulation dose is Warfarin sodium 5 mg tabs: Use as directed by Anticoagulation Clinic.  The target INR is 2.0-2.5.  The next INR is due 07/05/2009.  Anticoagulation instructions were given to patient.  Results were reviewed/authorized by Vashti Hey, RN.  She was notified by Vashti Hey RN.         Prior Anticoagulation Instructions: INR 1.6 Take coumadin 2 1/2 tablets tonight and tomorrow night then resume 1 1/2 tablets once daily   Current Anticoagulation Instructions: INR 2.6 Decrease dose to 7.5mg  once daily except 5mg  on Mondays and Thursdays until pt finished abx.  Finishes 6/25.

## 2010-02-10 NOTE — Medication Information (Signed)
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 no    Recent/future dental: no  Any missed doses?: no       Is patient compliant with meds? yes       Allergies: 1)  ! Minocycline Hcl (Minocycline Hcl) 2)  ! Chantix (Varenicline Tartrate)  Anticoagulation Management History:      The patient is taking warfarin and comes in today for a routine follow up visit.  Negative risk factors for bleeding include an age less than 57 years old.  The bleeding index is 'low risk'.  Negative CHADS2 values include Age > 74 years old.  The start date was 04/24/2005.  Anticoagulation responsible provider: Dietrich Pates MD, Molly Maduro.  INR POC: 3.5.  Cuvette Lot#: [id number].    Anticoagulation Management Assessment/Plan:      The patient's current anticoagulation dose is Warfarin sodium 5 mg tabs: Use as directed by Anticoagulation Clinic.  The target INR is 2.0-2.5.  The next INR is due 07/19/2009.  Anticoagulation instructions were given to patient.  Results were reviewed/authorized by Vashti Hey, RN.  She was notified by Vashti Hey RN.         Prior Anticoagulation Instructions: INR 2.6 Decrease dose to 7.5mg  once daily except 5mg  on Mondays and Thursdays until pt finished abx.  Finishes 6/25.  Current Anticoagulation Instructions: INR 3.5 Hold coumadin tomorrow, take 1 tablet on Wednesday then resume 1 1/2 tablets once daily except 1 tablet on Mondays and Thursdays

## 2010-02-10 NOTE — Medication Information (Signed)
Summary: RX Folder/ PICKED UP [facility]  RX Folder/ PICKED UP [facility]   Imported By: Dorise Hiss 08/24/2009 12:14:22  _____________________________________________________________________  External Attachment:    Type:   Image     Comment:   External Document

## 2010-02-10 NOTE — Miscellaneous (Signed)
Summary: Rehab Report/ OUTPATIENT PROGRAM [place]  Rehab Report/ OUTPATIENT PROGRAM [place]   Imported By: Dorise Hiss 07/01/2009 16:09:20  _____________________________________________________________________  External Attachment:    Type:   Image     Comment:   External Document

## 2010-02-10 NOTE — Progress Notes (Addendum)
Summary: hold coumadin for injections   Phone Note Other Incoming   Caller: FAX REQUEST FOR [facility] Summary of Call: Dr. Nickola Major - injections, need to stop coumadin for 6 days.  Initial call taken by: Hoover Brunette, LPN,  January 13, 2010 3:57 PM  Follow-up for Phone Call        what injections? . Patient has FV leiden -high risk thrombosis.  Follow-up by: Lewayne Bunting, MD, [facility],  January 13, 2010 11:15 PM  Additional Follow-up for Phone Call Additional follow up Details #1::        Call made to Dr. Nickola Major office requesting more info about injection.  See notes scanned in from their office.   Hoover Brunette, LPN  January 17, 2010 11:32 AM     Additional Follow-up for Phone Call Additional follow up Details #2::    reviewed records. Patient will need bridging with Lovenox. Please have Misty Stanley handle this. Do not take Lovenox the day of injection in the evening off. Ask orthopedic service if she can restart Lovenox the next day as well as Coumadin immediately the next day. Follow-up by: Lewayne Bunting, MD, [facility],  January 17, 2010 5:56 PM  Additional Follow-up for Phone Call Additional follow up Details #3:: Details for Additional Follow-up Action Taken: Above notes faxed back to Dr. Herminio Heads.  Please contact our office when you have date scheduled so that coumadin clinic can manage Lovenox bridging for patient.  Hoover Brunette, LPN  January 18, 2010 3:09 PM

## 2010-02-10 NOTE — Medication Information (Signed)
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 no    Recent/future dental: no  Any missed doses?: no       Is patient compliant with meds? no     Details: INR subtheraputic on [facility]   Allergies: 1)  ! Minocycline Hcl (Minocycline Hcl) 2)  ! Chantix (Varenicline Tartrate)  Anticoagulation Management History:      The patient is taking warfarin and comes in today for a routine follow up visit.  Negative risk factors for bleeding include an age less than 65 years old.  The bleeding index is 'low risk'.  Negative CHADS2 values include Age > 53 years old.  The start date was 04/24/2005.  INR POC: 3.1.  Cuvette Lot#: [id number].    Anticoagulation Management Assessment/Plan:      The patient's current anticoagulation dose is Warfarin sodium 5 mg tabs: Use as directed by Anticoagulation Clinic.  The target INR is 2 - 3.  The next INR is due 04/28/2009.  Anticoagulation instructions were given to patient.  Results were reviewed/authorized by Vashti Hey, RN.  She was notified by Vashti Hey RN.         Prior Anticoagulation Instructions: 10 mg Tu/7.5 mg daily  Current Anticoagulation Instructions: INR 3.1 Take coumadin 7.5mg  once daily

## 2010-02-10 NOTE — Letter (Signed)
Summary: External Correspondence/ [facility]   External Correspondence/ [facility]   Imported By: Dorise Hiss 01/17/2010 11:55:52  _____________________________________________________________________  External Attachment:    Type:   Image     Comment:   External Document  Appended Document: External Correspondence/ [facility]  reviewed

## 2010-02-10 NOTE — Letter (Signed)
Summary: Internal Correspondence/ PROGRESS NOTE PHYSICAL THERAPY [facility]  Internal Correspondence/ PROGRESS NOTE PHYSICAL THERAPY [place]   Imported By: Dorise Hiss 07/15/2009 16:32:16  _____________________________________________________________________  External Attachment:    Type:   Image     Comment:   External Document

## 2010-02-10 NOTE — Medication Information (Signed)
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 no    Recent/future dental: no  Any missed doses?: no       Is patient compliant with meds? yes       Allergies: 1)  ! Minocycline Hcl (Minocycline Hcl) 2)  ! Chantix (Varenicline Tartrate)  Anticoagulation Management History:      The patient is taking warfarin and comes in today for a routine follow up visit.  Negative risk factors for bleeding include an age less than 67 years old.  The bleeding index is 'low risk'.  Negative CHADS2 values include Age > 10 years old.  The start date was 04/24/2005.  Anticoagulation responsible provider: Diona Browner MD, Remi Deter.  INR POC: 2.1.  Cuvette Lot#: [id number].    Anticoagulation Management Assessment/Plan:      The patient's current anticoagulation dose is Warfarin sodium 5 mg tabs: Use as directed by Anticoagulation Clinic.  The target INR is 2.0-2.5.  The next INR is due 06/09/2009.  Anticoagulation instructions were given to patient.  Results were reviewed/authorized by Vashti Hey, RN.  She was notified by Vashti Hey RN.        Coagulation management information includes: INR goal dropped to 2.0-2.5 per Dr Andee Lineman.  Prior Anticoagulation Instructions: INR 2.5 Continue coumadin 7.5mg  once daily   Current Anticoagulation Instructions: INR 2.1 Continue coumadin 7.5mg  once daily

## 2010-02-10 NOTE — Miscellaneous (Signed)
Summary: rx - isosorbide  Clinical Lists Changes  Medications: Changed medication from ISOSORBIDE MONONITRATE CR 30 MG XR24H-TAB (ISOSORBIDE MONONITRATE) Take one tablet by mouth daily to IMDUR 60 MG XR24H-TAB (ISOSORBIDE MONONITRATE) take 1 1/2 tabs (90mg ) daily - Signed Rx of IMDUR 60 MG XR24H-TAB (ISOSORBIDE MONONITRATE) take 1 1/2 tabs (90mg ) daily;  #45 x 6;  Signed;  Entered by: Hoover Brunette, LPN;  Authorized by: Lewayne Bunting, MD, [facility];  Method used: Electronically to [facility] [address], [place], [place], [place]  [address], Ph: [phone], Fax: [phone]    Prescriptions: IMDUR 60 MG XR24H-TAB (ISOSORBIDE MONONITRATE) take 1 1/2 tabs (90mg ) daily  #45 x 6   Entered by:   Hoover Brunette, LPN   Authorized by:   Lewayne Bunting, MD, [employer]   Signed by:   Hoover Brunette, LPN on 13/08/6576   Method used:   Electronically to        [facility]  [address] (retail)       [address]       [place], [place]  [address]       Ph: [phone]       Fax: [phone]   RxID:   [id number]

## 2010-02-10 NOTE — Assessment & Plan Note (Signed)
Summary: 2 wk fu  Medications Added FLUDROCORTISONE ACETATE 0.1 MG TABS (FLUDROCORTISONE ACETATE) Take 1 tablet by mouth once a day      Allergies Added:   Visit Type:  Follow-up Primary Provider:  none   History of Present Illness: the patient is a 60 year old female with a history of coronary artery disease status post stenting of the right ostial coronary artery in December of 2002. The patient required a cutting balloon intervention in 2003 due to instent restenosis. She now presented with an acute inferior wall microinfarction 99% stenosed ostium of the right coronary artery. The patient underwent drug-eluting stent placement with a PROMUSE stent the right coronary artery. The patient is a triple anticoagulant regimen in part due to her prior history of DVT and factor V Leiden deficiency.expired at the last office visit the patient complained of significant dizziness and had noticed weakness in the upper extremities.  I referred the patient for a CT scan to make sure that she did not have an intracranial hemorrhage on triple anticoagulant regimen.  CT scan was negative.  The patient does have symptoms of orthostatic dizziness.  She does not drink a lot of fluids.  She still has occasional chest pain and has used 4 nitroglycerin since her last office visit.  Her angina pattern however appears to be stable.  She continues to have difficulty walking because of her hip fracture last year.  The patient also has resumed Plavix.  Preventive Screening-Counseling & Management  Alcohol-Tobacco     Smoking Status: current     Smoking Cessation Counseling: yes     Year Started: 1/2 PPD  Current Medications (verified): 1)  Advair Diskus 250-50 Mcg/dose Misc (Fluticasone-Salmeterol) .... Inhale 1 Puff As Directed Twice A Day 2)  Atenolol 25 Mg Tabs (Atenolol) .... Take 1 Tablet By Mouth Once A Day 3)  Levothyroxine Sodium 100 Mcg Tabs (Levothyroxine Sodium) .... Take 1 Tablet By Mouth Once A  Day 4)  Lovastatin 40 Mg Tabs (Lovastatin) .... Take 1 Tablet By Mouth At Bedtime 5)  Spiriva Handihaler 18 Mcg Caps (Tiotropium Bromide Monohydrate) .... Inhale 1 Capsule As Directed Once  A Day 6)  Albuterol 90 Mcg/act  Aers (Albuterol) .... As Needed 7)  Niaspan .... At Bedtime 8)  Nitroglycerin .... As Needed 9)  Warfarin Sodium 5 Mg Tabs (Warfarin Sodium) .... Use As Directed By Anticoagulation Clinic 10)  Plavix 75 Mg Tabs (Clopidogrel Bisulfate) .... Take 1 Tablet By Mouth Once A Day 11)  Aspir-Trin 325 Mg Tbec (Aspirin) .... Take 1 Tablet By Mouth Once A Day 12)  Isosorbide Mononitrate Cr 30 Mg Xr24h-Tab (Isosorbide Mononitrate) .... Take One Tablet By Mouth Daily  Allergies (verified): 1)  ! Minocycline Hcl (Minocycline Hcl) 2)  ! Chantix (Varenicline Tartrate)  Comments:  Nurse/Medical Assistant: The patient's medications and allergies were reviewed with the patient and were updated in the Medication and Allergy Lists.List and verbally reviewed.   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 B.  status post non-ST elevation myocardial infarction/stenting ostial right coronary artery, December 2002. History of preserved left ventricular function C. acute inferior wall myocardial infarction with stenting of a 99% stenosis of the ostium of the right coronary artery  with a drug-eluting stent. April 2011 Hx of PULMONARY EMBOLISM (ICD-415.19) History of deep venous thrombosis History of Factor V Leiden gene mutation SYSTEMIC LUPUS ERYTHEMATOSUS, HX OF  (ICD-V12.2) Obstructive sleep apnea  Past Surgical History: Last updated: 03/25/2008 Unremarkable  Family History: Last updated: 03/25/2008 Family History of Coronary Artery Disease:   Social History: Last updated: 03/25/2008 Still works as Naval architect. Tobacco Use - Yes.   Risk Factors: Smoking Status: current (05/28/2009)  Review of Systems       The patient complains of fatigue, chest pain, shortness of breath, and dizziness.  The patient denies malaise, fever, weight gain/loss, vision loss, decreased hearing, hoarseness, palpitations, prolonged cough, wheezing, sleep apnea, coughing up blood, abdominal pain, blood in stool, nausea, vomiting, diarrhea, heartburn, incontinence, blood in urine, muscle weakness, joint pain, leg swelling, rash, skin lesions, headache, fainting, depression, anxiety, enlarged lymph nodes, easy bruising or bleeding, and environmental allergies.    Vital Signs:  Patient profile:   60 year old female Height:      66 inches Weight:      208 pounds Pulse rate:   76 / minute Pulse (ortho):   86 / minute BP sitting:   109 / 71  (left arm) BP standing:   98 / 59 Cuff size:   large  Vitals Entered By: Carlye Grippe (May 28, 2009 9:32 AM)  Serial Vital Signs/Assessments:  Time      Position  BP       Pulse  Resp  Temp     By 10:26 AM  Lying LA  112/71   80                    Lydia Anderson 10:26 AM  Sitting   106/72   80                    Lydia Anderson 10:26 AM  Standing  98/59    86                    Lydia Anderson 10:26 AM  Standing  101/53   91                    Lydia Anderson 10:26 AM  Standing  107/73   84                    Carlye Grippe   Physical Exam  Additional Exam:  General: Well-developed, well-nourished in no distress head: Normocephalic and atraumatic eyes PERRLA/EOMI intact, conjunctiva and lids normal nose: No deformity or lesions mouth normal dentition, normal posterior pharynx neck: Supple, no JVD.  No masses,  thyromegaly or abnormal cervical nodes lungs: Normal breath sounds bilaterally without wheezing.  Normal percussion heart: regular rate and rhythm with normal S1 and S2, no S3 or S4.  PMI is normal.  No pathological murmurs abdomen: Normal bowel sounds, abdomen is soft and nontender without masses, organomegaly or hernias noted.  No hepatosplenomegaly musculoskeletal: Difficulty walking, uses a cane. Difficult to support her weight on the right hip pulsus: Pulse is normal in all 4 extremities Extremities: No peripheral pitting edema neurologic: Alert and oriented x 3 skin: Intact without lesions or rashes cervical nodes: No significant adenopathy psychologic: Normal affect    Impression & Recommendations:  Problem # 1:  DIZZINESS (ICD-780.4) this and this is not clearly orthostatic in nature.  I asked the patient to drink Gatorade.  If her symptoms do not  improve then we will start him on low dose of Florinef.  Problem # 2:  COPD (ICD-496) the patient's severe COPD she remains significant short of breath.  Unfortunately she continues to smoke Her updated medication list for this problem includes:    Advair Diskus 250-50 Mcg/dose Misc (Fluticasone-salmeterol) ..... Inhale 1 puff as directed twice a day    Spiriva Handihaler 18 Mcg Caps (Tiotropium bromide monohydrate) ..... Inhale 1 capsule as directed once  a day    Albuterol 90 Mcg/act Aers (Albuterol) .[name]... As needed  Problem # 3:  TOBACCO ABUSE (ICD-305.1) I counseled the patient again regarding her tobacco use  Problem # 4:  HYPERLIPIDEMIA (ICD-272.4) continue statin drug therapy. Her updated medication list for this problem includes:    Lovastatin 40 Mg Tabs (Lovastatin) .[name]... Take 1 tablet by mouth at bedtime  Patient Instructions: 1)  RETURN FOR NURSE VISIT ON THURSDAY, MAY 26TH AT 9AM. 2)  Your physician wants you to follow-up in: 3 months. You will receive a reminder letter in the mail one-two months in advance. If you  don't receive a letter, please call our office to schedule the follow-up appointment. 3)  Dring 32 ounces of G2 (gatorade) daily. 4)  Start Florinef (fludrocortisone) 0.1mg  by mouth once daily. Prescriptions: FLUDROCORTISONE ACETATE 0.1 MG TABS (FLUDROCORTISONE ACETATE) Take 1 tablet by mouth once a day  #30 x 3   Entered by:   Cyril Loosen, RN, BSN   Authorized by:   Lewayne Bunting, MD, [facility]   Signed by:   Cyril Loosen, RN, BSN on 05/28/2009   Method used:   Electronically to        [facility]  [address] (retail)       [address]       [place], [place]  [address]       Ph: [phone]       Fax: [phone]   RxID:   [id number]   Handout requested.

## 2010-02-10 NOTE — Medication Information (Signed)
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 no    Recent/future dental: no  Any missed doses?: no       Is patient compliant with meds? yes       Allergies: 1)  ! Minocycline Hcl (Minocycline Hcl) 2)  ! Chantix (Varenicline Tartrate)  Anticoagulation Management History:      The patient is taking warfarin and comes in today for a routine follow up visit.  Negative risk factors for bleeding include an age less than 73 years old.  The bleeding index is 'low risk'.  Negative CHADS2 values include Age > 39 years old.  The start date was 04/24/2005.  Anticoagulation responsible provider: Diona Browner MD, Remi Deter.  INR POC: 2.3.  Cuvette Lot#: [id number].    Anticoagulation Management Assessment/Plan:      The patient's current anticoagulation dose is Warfarin sodium 5 mg tabs: Use as directed by Anticoagulation Clinic.  The target INR is 2.0-2.5.  The next INR is due 09/15/2009.  Anticoagulation instructions were given to patient.  Results were reviewed/authorized by Vashti Hey, RN.  She was notified by Vashti Hey RN.         Prior Anticoagulation Instructions: INR 2.0 Continue coumadin 7.5mg  once daily except 5mg  on Mondays and Thursdays  Current Anticoagulation Instructions: INR 2.3 Continue coumadin 7.5mg  once daily except 5mg  on Mondays and Thursdays

## 2010-02-10 NOTE — Letter (Signed)
Summary: Letter/ FAXED [facility]  Letter/ FAXED [facility]   Imported By: Dorise Hiss 01/25/2010 16:26:00  _____________________________________________________________________  External Attachment:    Type:   Image     Comment:   External Document

## 2010-02-10 NOTE — Medication Information (Signed)
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 no    Recent/future dental: no  Any missed doses?: no       Is patient compliant with meds? yes       Allergies: 1)  ! Minocycline Hcl (Minocycline Hcl) 2)  ! Chantix (Varenicline Tartrate)  Anticoagulation Management History:      The patient is taking warfarin and comes in today for a routine follow up visit.  Negative risk factors for bleeding include an age less than 76 years old.  The bleeding index is 'low risk'.  Negative CHADS2 values include Age > 46 years old.  The start date was 04/24/2005.  Anticoagulation responsible provider: Dietrich Pates MD, Molly Maduro.  INR POC: 2.2.    Anticoagulation Management Assessment/Plan:      The patient's current anticoagulation dose is Warfarin sodium 5 mg tabs: Use as directed by Anticoagulation Clinic.  The target INR is 2.0-2.5.  The next INR is due 10/13/2009.  Anticoagulation instructions were given to patient.  Results were reviewed/authorized by Vashti Hey, RN.  She was notified by Vashti Hey RN.         Prior Anticoagulation Instructions: INR 2.3 Continue coumadin 7.5mg  once daily except 5mg  on Mondays and Thursdays  Current Anticoagulation Instructions: INR 2.2 Continue coumadin 7.5mg  once daily except 5mg  on Mondays and Thursdays

## 2010-02-10 NOTE — Miscellaneous (Signed)
Summary: PHYSICAL THERAPY ORDER  Clinical Lists Changes  Problems: Added new problem of UNSTEADY GAIT (ICD-781.2) Orders: Added new Referral order of Physical Therapy Referral (PT) - Signed

## 2010-02-10 NOTE — Medication Information (Signed)
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 Minocycline Hcl (Minocycline Hcl) 2)  ! Chantix (Varenicline Tartrate) 3)  ! Prednisone  Anticoagulation Management History:      The patient is taking warfarin and comes in today for a routine follow up visit.  Negative risk factors for bleeding include an age less than 60 years old.  The bleeding index is 'low risk'.  Negative CHADS2 values include Age > 30 years old.  The start date was 04/24/2005.  Anticoagulation responsible provider: Dietrich Pates MD, Molly Maduro.  INR POC: 1.8.  Cuvette Lot#: [id number].    Anticoagulation Management Assessment/Plan:      The patient's current anticoagulation dose is Warfarin sodium 5 mg tabs: Use as directed by Anticoagulation Clinic.  The target INR is 2.0-2.5.  The next INR is due 02/07/2010.  Anticoagulation instructions were given to patient.  Results were reviewed/authorized by Vashti Hey, RN.  She was notified by Vashti Hey RN.         Prior Anticoagulation Instructions: INR 1.8 Continue coumadin 7.5mg  once daily except 3.75mg  on Saturdays  Current Anticoagulation Instructions: INR 1.8 Take extra 3.75mg  tonight then increase coumadin to 7.5mg  once daily  Will be changing to the 5mg  tablet in 2 weeks, then will take 1 1/2 tablets once daily

## 2010-02-10 NOTE — Assessment & Plan Note (Signed)
Summary: eph-post cone dsch 4/9 MI  Medications Added LEVOTHYROXINE SODIUM 100 MCG TABS (LEVOTHYROXINE SODIUM) Take 1 tablet by mouth once a day PLAVIX 75 MG TABS (CLOPIDOGREL BISULFATE) Take 1 tablet by mouth once a day ASPIR-TRIN 325 MG TBEC (ASPIRIN) Take 1 tablet by mouth once a day ISOSORBIDE MONONITRATE CR 30 MG XR24H-TAB (ISOSORBIDE MONONITRATE) Take one tablet by mouth daily      Allergies Added:   Visit Type:  hospital follow-up Primary Provider:  none   History of Present Illness: the patient is a 60 year old female with a history of coronary artery disease status post stenting of the right ostial coronary artery in December of 2002. The patient required a cutting balloon intervention in 2003 due to instent restenosis. She now presented with an acute inferior wall microinfarction 99% stenosed ostium of the right coronary artery. The patient underwent drug-eluting stent placement with a PROMUSE stent the right coronary artery. The patient is a triple anticoagulant regimen in part due to her prior history of DVT and factor V Leiden deficiency.  The patient reported she sat multiple bruises in the meanwhile jiggling the right arm. She also has been feeling off balance drift toward the right. She reports weakness in both upper extremities. She also has difficulty walking since her hip fracture last year. His current disability. Unfortunate patient ran out of her Plavix today and her last dose was yesterday.  She denies any headaches. He does have shortness of breath both at rest and on minimal exertion. She has underlying severe COPD and is on inhaled corticosteroids, anticholinergic agents and beta agonists. She also reports that she had recurrent subdural chest pain since hospital admission. She reports a heaviness but without shortness of breath or diaphoresis. She reported an episode in the office which appear to be somewhat atypical for angina. We obtained an electrocardiogram and  this was within normal limits. The patient was given a nitroglycerin tablet and felt her symptoms improved.  Current Medications (verified): 1)  Advair Diskus 250-50 Mcg/dose Misc (Fluticasone-Salmeterol) .... Inhale 1 Puff As Directed Twice A Day 2)  Atenolol 25 Mg Tabs (Atenolol) .... Take 1 Tablet By Mouth Once A Day 3)  Levothyroxine Sodium 100 Mcg Tabs (Levothyroxine Sodium) .... Take 1 Tablet By Mouth Once A Day 4)  Lovastatin 40 Mg Tabs (Lovastatin) .... Take 1 Tablet By Mouth At Bedtime 5)  Spiriva Handihaler 18 Mcg Caps (Tiotropium Bromide Monohydrate) .... Inhale 1 Capsule As Directed Once  A Day 6)  Albuterol 90 Mcg/act  Aers (Albuterol) .... As Needed 7)  Niaspan .... At Bedtime 8)  Nitroglycerin .... As Needed 9)  Warfarin Sodium 5 Mg Tabs (Warfarin Sodium) .... Use As Directed By Anticoagulation Clinic 10)  Plavix 75 Mg Tabs (Clopidogrel Bisulfate) .... Take 1 Tablet By Mouth Once A Day 11)  Aspir-Trin 325 Mg Tbec (Aspirin) .... Take 1 Tablet By Mouth Once A Day  Allergies (verified): 1)  ! Minocycline Hcl (Minocycline Hcl) 2)  ! Chantix (Varenicline Tartrate)  Comments:  Nurse/Medical Assistant: The patient is currently on medications but does not know the name or dosage at this time. Instructed to contact our office with details. Will update medication list at that time.  Past History:  Past Surgical History: Last updated: 03/25/2008 Unremarkable  Family History: Last updated: 03/25/2008 Family History of Coronary Artery Disease:   Social History: Last updated: 03/25/2008 Still works as Naval architect. Tobacco Use - Yes.   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  status post initial  "cutting balloon" angioplasty, followed by elective brachytherapy of high-grade, ostial in-stent restenosis of right coronary artery, May 2003. B.  status post non-ST elevation myocardial infarction/stenting ostial right coronary artery, December 2002. History of preserved left ventricular function C. acute inferior wall myocardial infarction with stenting of a 99% stenosis of the ostium of the right coronary artery with a drug-eluting stent. April 2011 Hx of PULMONARY EMBOLISM (ICD-415.19) History of deep venous thrombosis History of Factor V Leiden gene mutation SYSTEMIC LUPUS ERYTHEMATOSUS, HX OF (ICD-V12.2) Obstructive sleep apnea  Vital Signs:  Patient profile:   60 year old female Height:      66 inches Weight:      207 pounds BMI:     33.53 Pulse rate:   83 / minute BP sitting:   126 / 79  (left arm) Cuff size:   large  Vitals Entered By: Carlye Grippe (May 06, 2009 1:07 PM)  Nutrition Counseling: Patient's BMI is greater than 25 and therefore counseled on weight management options.  Physical Exam  Additional Exam:  General: Well-developed, well-nourished in no distress head: Normocephalic and atraumatic eyes PERRLA/EOMI intact, conjunctiva and lids normal nose: No deformity or lesions mouth normal dentition, normal posterior pharynx neck: Supple, no JVD.  No masses, thyromegaly or abnormal cervical nodes lungs: Normal breath sounds bilaterally without wheezing.  Normal percussion heart: regular rate and rhythm with normal S1 and S2, no S3 or S4.  PMI is normal.  No pathological murmurs abdomen: Normal bowel sounds, abdomen is soft and nontender without masses, organomegaly or hernias noted.  No hepatosplenomegaly musculoskeletal: Difficulty walking, uses a cane. Difficult to support her weight on the right hip pulsus: Pulse is normal in all 4 extremities Extremities: No peripheral pitting edema neurologic: Alert and oriented x 3 skin: Intact without lesions or  rashes cervical nodes: No significant adenopathy psychologic: Normal affect    EKG  Procedure date:  05/06/2009  Findings:      normal sinus rhythm no acute ST-T wave changes  Impression & Recommendations:  Problem # 1:  DIZZINESS (ICD-780.4) the patient reports significant dizziness and drift to the right. She also has noticed weakness in the upper extremities since she has been placed on triple and quite old regimen. We will refer to patient for a CT scan of the brain to make sure that she does not have an intracranial hemorrhage. Orders: CT Scan without Contrast (CT w/o contrast)  Problem # 2:  CORONARY ARTERY DISEASE (ICD-414.00) the patient has recurrent chest pain but this appears to be atypical. She has some improvement with nitroglycerin and I will place her on a long-acting nitroglycerin preparation. She has no acute changes on electrocardiogram. She did run out of her Plavix and we will resume this today. We'll have the patient come back soon to followup and notified her to call us if she has recurrent chest pain Her updated medication list for this problem includes:    Atenolol 25 Mg Tabs (Atenolol) .[name]... Take 1 tablet by mouth once a day    Warfarin Sodium 5 Mg Tabs (Warfarin sodium) ..... Use as directed by anticoagulation clinic    Plavix 75 Mg Tabs (Clopidogrel bisulfate) .[name]... Take 1 tablet by mouth once a day    Aspir-trin 325 Mg Tbec (Aspirin) .[name]... Take 1 tablet by mouth once a  day    Isosorbide Mononitrate Cr 30 Mg Xr24h-tab (Isosorbide mononitrate) .[name]... Take one tablet by mouth daily  Orders: EKG w/ Interpretation (93000)  Problem # 3:  Hx of PULMONARY EMBOLISM (ICD-415.19) the patient will need to stay on Coumadin given her history of recurrent DVT and factor V Leiden deficiency. Unfortunately as a drug-eluting stent and is having bruises on triple antiviral regimen. We will continue the triple anticoagulant regimen for 30 days at which point aspirin can be  stopped and Plavix and Coumadin can be continued. The patient also will be followed closely the Coumadin clinic and will keep her INR between 2 and 2.5. Her updated medication list for this problem includes:    Warfarin Sodium 5 Mg Tabs (Warfarin sodium) ..... Use as directed by anticoagulation clinic    Plavix 75 Mg Tabs (Clopidogrel bisulfate) .[name]... Take 1 tablet by mouth once a day    Aspir-trin 325 Mg Tbec (Aspirin) .[name]... Take 1 tablet by mouth once a day  Patient Instructions: 1)  Non-Cardiac CT scanning, (CAT scanning), is a noninvasive, special x-ray that produces cross-sectional images of the body using x-rays and a computer. CT scans help physicians diagnose and treat medical conditions. For some CT exams, a contrast material is used to enhance visibility in the area of the body being studied. CT scans provide greater clarity and reveal more details than regular x-ray exams. 2)  Start Imdur (isosorbide) 30mg  by mouth once daily. 3)  Continue Aspirin/Plavix/Coumadin for 30 days then stop Aspirin and continue Plavix and Coumadin. 4)  FOLLOW UP WITH DR. Andee Lineman ON FRIDAY, MAY 20TH AT 9:15AM. Prescriptions: ISOSORBIDE MONONITRATE CR 30 MG XR24H-TAB (ISOSORBIDE MONONITRATE) Take one tablet by mouth daily  #30 x 6   Entered by:   Cyril Loosen, RN, BSN   Authorized by:   Lewayne Bunting, MD, [facility]   Signed by:   Cyril Loosen, RN, BSN on 05/06/2009   Method used:   Electronically to        [facility]  [address] (retail)       [address]       [place], [place]  [address]       Ph: [phone]       Fax: [phone]   RxID:   [id number]   Handout requested.    Medication Administration  Medication # 1:    Medication: NTG 1/150 gr tab    Diagnosis: 786.50    Dose: 1tablets    Route: SL    Exp Date: 06/10/2010    Lot #: [id number]    Mfr: [facility]    Comments: Rates chest pain at 4/10 Pain relieved after 5 minutes (0/10). Cyril Loosen, RN, BSN  May 06, 2009 1:54 PM     Patient tolerated medication without complications    Given by: Cyril Loosen, RN, BSN (May 06, 2009 1:45 PM)  Orders Added: 1)  CT Scan without Contrast [CT w/o contrast] 2)  EKG w/ Interpretation [93000]

## 2010-02-10 NOTE — Medication Information (Signed)
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 no    Recent/future dental: no  Any missed doses?: no       Is patient compliant with meds? yes      Comments: Pt got last coumadin Rx from [facility] and they gave her 7.5mg  tablets instead od 5mg  tablets.  She has been taking the number of tablets using the 7.5mg  tablet instead of the 5mg  tablet.  Allergies: 1)  ! Minocycline Hcl (Minocycline Hcl) 2)  ! Chantix (Varenicline Tartrate)  Anticoagulation Management History:      The patient is taking warfarin and comes in today for a routine follow up visit.  Negative risk factors for bleeding include an age less than 75 years old.  The bleeding index is 'low risk'.  Negative CHADS2 values include Age > 38 years old.  The start date was 04/24/2005.  Anticoagulation responsible provider: Dietrich Pates MD, Molly Maduro.  INR POC: 4.9.  Cuvette Lot#: [id number].    Anticoagulation Management Assessment/Plan:      The patient's current anticoagulation dose is Warfarin sodium 5 mg tabs: Use as directed by Anticoagulation Clinic.  The target INR is 2.0-2.5.  The next INR is due 12/06/2009.  Anticoagulation instructions were given to patient.  Results were reviewed/authorized by Vashti Hey, RN.  She was notified by Vashti Hey RN.         Prior Anticoagulation Instructions: INR 2.0 Continue coumadin 7.5mg  once daily except 5mg  on Mondays and Thursdays  Current Anticoagulation Instructions: INR 4.9 Pt got last coumadin Rx from [facility] and they gave her 7.5mg  tablets.  She has  been taking the same number of tablets using the 7.5mg  tablet.  Pt has taken coumadin today. Hold coumadin Thursday and Friday night then take coumadin 7.5mg  once daily except 3.75mg  on Saturdays

## 2010-02-10 NOTE — Medication Information (Signed)
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 yes       Details: On prednisone 10mg  dose pk and cipro 500mg  bid x 10 days  Started on 12/23/09  Recent/future dental: no  Any missed doses?: no       Is patient compliant with meds? yes       Allergies: 1)  ! Minocycline Hcl (Minocycline Hcl) 2)  ! Chantix (Varenicline Tartrate)  Anticoagulation Management History:      The patient is taking warfarin and comes in today for a routine follow up visit.  Negative risk factors for bleeding include an age less than 61 years old.  The bleeding index is 'low risk'.  Negative CHADS2 values include Age > 71 years old.  The start date was 04/24/2005.  Anticoagulation responsible Jeslin Bazinet: Dietrich Pates MD, Molly Maduro.  INR POC: 1.8.  Cuvette Lot#: [id number].    Anticoagulation Management Assessment/Plan:      The patient's current anticoagulation dose is Warfarin sodium 5 mg tabs: Use as directed by Anticoagulation Clinic.  The target INR is 2.0-2.5.  The next INR is due 01/17/2010.  Anticoagulation instructions were given to patient.  Results were reviewed/authorized by Vashti Hey, RN.  She was notified by Vashti Hey RN.         Prior Anticoagulation Instructions: INR 2.0 Continue coumadin 7.5mg  once daily except 3.75mg  on Saturdays  Current Anticoagulation Instructions: INR 1.8 Continue coumadin 7.5mg  once daily except 3.75mg  on Saturdays

## 2010-02-10 NOTE — Letter (Signed)
Summary: [employer] Engineer, agricultural at [facility] [address]   [place], [place] [address]   Phone: [phone]  Fax: [phone]        November 16, 2009 MRN: [id number]    CHALYN AMESCUA [address] [place], [place]  [address] DOB 1950-12-26     Requesting address change for patient above.  Application was initiated while she was in the hospital.  Dr. Verne Carrow was prescribing provider, but his home office is at our [facility] location.  The patient comes to our [place] location.   Please change delivery address to:  [address]             [address]             [place], [place]  [address]           Sincerely,  Hoover Brunette, LPN  This letter has been electronically signed by your physician.

## 2010-02-10 NOTE — Assessment & Plan Note (Signed)
Summary: chest pain, add per GD  --agh      Allergies Added:   Visit Type:  Follow-up Primary Provider:  none   History of Present Illness: the patient is a 60 year old female with a history of coronary artery disease status post stenting of the right ostial coronary artery in December of 2002. The patient required a cutting balloon intervention in 2003 due to instent restenosis. She now presented with an acute inferior wall microinfarction 99% stenosed ostium of the right coronary artery. The patient underwent drug-eluting stent placement with a PROMUSE stent the right coronary artery. the patient reportedly was [facility]. The patient reported angina.she told the physician's assistant that she was taking multiple nitroglycerin tablets. Therefore I set the patient up for Cardiolite stress imaging study. This was however normal.  The patient reports that her chest pain is better than your last office visit. Her respiratory problems also improved. She states it was nitroglycerin previously. We also increase her Imdur to 90 mg a day.  Preventive Screening-Counseling & Management  Alcohol-Tobacco     Smoking Status: current     Smoking Cessation Counseling: yes     Packs/Day: 1/2 PPD  Current Medications (verified): 1)  Advair Diskus 250-50 Mcg/dose Misc (Fluticasone-Salmeterol) .... Inhale 1 Puff As Directed Twice A Day 2)  Atenolol 25 Mg Tabs (Atenolol) .... Take 1 Tablet By Mouth Twice A Day 3)  Levothyroxine Sodium 100 Mcg Tabs (Levothyroxine Sodium) .... Take 1 Tablet By Mouth Once A Day 4)  Lovastatin 40 Mg Tabs (Lovastatin) .... Take 1 Tablet By Mouth At Bedtime 5)  Spiriva Handihaler 18 Mcg Caps (Tiotropium Bromide Monohydrate) .... Inhale 1 Capsule As Directed Once  A Day 6)  Albuterol 90 Mcg/act  Aers (Albuterol) .... As Needed 7)  Niaspan .... At Bedtime 8)  Nitroglycerin .... As Needed 9)  Warfarin Sodium 5 Mg Tabs (Warfarin Sodium) .... Use As Directed  By Anticoagulation Clinic 10)  Plavix 75 Mg Tabs (Clopidogrel Bisulfate) .... Take 1 Tablet By Mouth Once A Day 11)  Imdur 60 Mg Xr24h-Tab (Isosorbide Mononitrate) .... Take 1 1/2 Tabs (90mg ) Daily 12)  Fludrocortisone Acetate 0.1 Mg Tabs (Fludrocortisone Acetate) .... Take 1 Tablet By Mouth Once A Day  Allergies (verified): 1)  ! Minocycline Hcl (Minocycline Hcl) 2)  ! Chantix (Varenicline Tartrate)  Comments:  Nurse/Medical Assistant: The patient's medication list and allergies were reviewed with the patient and were updated in the Medication and Allergy Lists.  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 B.  status post non-ST elevation myocardial infarction/stenting ostial right coronary artery, December 2002. History of preserved left ventricular function C. acute inferior wall myocardial infarction with stenting of a 99% stenosis of the ostium of the right coronary artery with a drug-eluting stent. April 2011 Hx of PULMONARY EMBOLISM (ICD-415.19) History of deep venous thrombosis History of Factor V Leiden gene mutation SYSTEMIC LUPUS ERYTHEMATOSUS, HX OF (ICD-V12.2) Obstructive sleep apnea  Past Surgical History: Last updated: 03/25/2008 Unremarkable  Family History: Last updated: 03/25/2008 Family History of Coronary Artery Disease:   Social History: Last updated: 03/25/2008 Still works as Naval architect. Tobacco Use - Yes.   Risk Factors: Smoking Status: current (06/17/2009)  Packs/Day: 1/2 PPD (06/17/2009)  Social History: Packs/Day:  1/2  PPD  Review of Systems       The patient complains of chest pain, shortness of breath, and dizziness.  The patient denies fatigue, malaise, fever, weight gain/loss, vision loss, decreased hearing, hoarseness, palpitations, prolonged cough, wheezing, sleep apnea, coughing up blood, abdominal pain, blood in stool, nausea, vomiting, diarrhea, heartburn, incontinence, blood in urine, muscle weakness, joint pain, leg swelling, rash, skin lesions, headache, fainting, depression, anxiety, enlarged lymph nodes, easy bruising or bleeding, and environmental allergies.    Vital Signs:  Patient profile:   60 year old female Height:      66 inches Weight:      209 pounds Pulse rate:   80 / minute BP sitting:   107 / 66  (left arm) Cuff size:   large  Vitals Entered By: Carlye Grippe (June 17, 2009 12:55 PM)  Physical Exam  Additional Exam:  General: Well-developed, well-nourished in no distress head: Normocephalic and atraumatic eyes PERRLA/EOMI intact, conjunctiva and lids normal nose: No deformity or lesions mouth normal dentition, normal posterior pharynx neck: Supple, no JVD.  No masses, thyromegaly or abnormal cervical nodes lungs: Normal breath sounds bilaterally without wheezing.  Normal percussion heart: regular rate and rhythm with normal S1 and S2, no S3 or S4.  PMI is normal.  No pathological murmurs abdomen: Normal bowel sounds, abdomen is soft and nontender without masses, organomegaly or hernias noted.  No hepatosplenomegaly musculoskeletal: Difficulty walking, uses a cane. Difficult to support her weight on the right hip pulsus: Pulse is normal in all 4 extremities Extremities: No peripheral pitting edema neurologic: Alert and oriented x 3 skin: Intact without lesions or rashes cervical nodes: No significant adenopathy psychologic: Normal affect    Impression & Recommendations:  Problem # 1:  CHEST PAIN (ICD-786.50) chronic stable angina with slight improvement in her  symptoms. We'll increase Imdur to 120 milligram p.o. q. daily. Stress test within normal limits. The following medications were removed from the medication list:    Aspir-trin 325 Mg Tbec (Aspirin) .[name]... Take 1 tablet by mouth once a day Her updated medication list for this problem includes:    Atenolol 25 Mg Tabs (Atenolol) .[name]... Take 1 tablet by mouth twice a day    Warfarin Sodium 5 Mg Tabs (Warfarin sodium) ..... Use as directed by anticoagulation clinic    Plavix 75 Mg Tabs (Clopidogrel bisulfate) .[name]... Take 1 tablet by mouth once a day    Imdur 60 Mg Xr24h-tab (Isosorbide mononitrate) .[name]... Take 1 1/2 tabs (90mg ) daily  Problem # 2:  DIZZINESS (ICD-780.4) much improved. The patient is increasing her fluid intake.  Problem # 3:  Hx of PULMONARY EMBOLISM (ICD-415.19) the patient has factor V Leiden deficiency.she remains on Coumadin as well as Plavix because of a prior stent placement The following medications were removed from the medication list:    Aspir-trin 325 Mg Tbec (Aspirin) .[name]... Take 1 tablet by mouth once a day Her updated medication list for this problem includes:    Warfarin Sodium 5 Mg Tabs (Warfarin sodium) ..... Use as directed by anticoagulation clinic    Plavix 75 Mg Tabs (Clopidogrel bisulfate) .[name]... Take 1 tablet by mouth once a day  Patient Instructions: 1)  Your physician recommends that you continue on your current medications as directed. Please refer to the Current Medication list given to you today. 2)  Follow up in  6 months

## 2010-02-10 NOTE — Medication Information (Signed)
Summary: RX Folder/ [facility]  RX Folder/ [facility]   Imported By: Dorise Hiss 05/10/2009 09:05:43  _____________________________________________________________________  External Attachment:    Type:   Image     Comment:   External Document

## 2010-02-10 NOTE — Miscellaneous (Signed)
Summary: Rehab Report/ FAXED CARDIAC REHAB PROGRAM  Rehab Report/ FAXED CARDIAC REHAB PROGRAM   Imported By: Dorise Hiss 06/18/2009 14:57:21  _____________________________________________________________________  External Attachment:    Type:   Image     Comment:   External Document

## 2010-02-10 NOTE — Miscellaneous (Signed)
Summary: Rehab Report/ DISCONTINUED CARDIAC REHAB  Rehab Report/ DISCONTINUED CARDIAC REHAB   Imported By: Dorise Hiss 06/25/2009 11:39:59  _____________________________________________________________________  External Attachment:    Type:   Image     Comment:   External Document

## 2010-02-11 NOTE — Letter (Signed)
Summary: External Correspondence/ [facility]   External Correspondence/ [facility]   Imported By: Dorise Hiss 08/05/2009 14:35:08  _____________________________________________________________________  External Attachment:    Type:   Image     Comment:   External Document

## 2010-02-16 NOTE — Medication Information (Signed)
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 no    Recent/future dental: no  Any missed doses?: no       Is patient compliant with meds? yes       Allergies: 1)  ! Minocycline Hcl (Minocycline Hcl) 2)  ! Chantix (Varenicline Tartrate) 3)  ! Prednisone  Anticoagulation Management History:      The patient is taking warfarin and comes in today for a routine follow up visit.  Negative risk factors for bleeding include an age less than 83 years old.  The bleeding index is 'low risk'.  Negative CHADS2 values include Age > 43 years old.  The start date was 04/24/2005.  Anticoagulation responsible provider: Dietrich Pates MD, Molly Maduro.  INR POC: 1.9.  Cuvette Lot#: [id number].    Anticoagulation Management Assessment/Plan:      The patient's current anticoagulation dose is Warfarin sodium 5 mg tabs: Use as directed by Anticoagulation Clinic.  The target INR is 2.0-2.5.  The next INR is due 02/28/2010.  Anticoagulation instructions were given to patient.  Results were reviewed/authorized by Vashti Hey, RN.  She was notified by Vashti Hey RN.         Prior Anticoagulation Instructions: INR 1.8 Take extra 3.75mg  tonight then increase coumadin to 7.5mg  once daily  Will be changing to the 5mg  tablet in 2 weeks, then will take 1 1/2 tablets once daily   Current Anticoagulation Instructions: INR 1.9 Increase coumadin to 7.5mg  once daily except 10mg  on Mondays

## 2010-02-22 ENCOUNTER — Encounter: Payer: Self-pay | Admitting: Cardiology

## 2010-02-23 ENCOUNTER — Encounter: Payer: Self-pay | Admitting: Cardiology

## 2010-02-23 ENCOUNTER — Encounter (INDEPENDENT_AMBULATORY_CARE_PROVIDER_SITE_OTHER): Payer: Self-pay

## 2010-02-23 DIAGNOSIS — I80299 Phlebitis and thrombophlebitis of other deep vessels of unspecified lower extremity: Secondary | ICD-10-CM

## 2010-02-23 DIAGNOSIS — Z7901 Long term (current) use of anticoagulants: Secondary | ICD-10-CM

## 2010-02-25 ENCOUNTER — Telehealth (INDEPENDENT_AMBULATORY_CARE_PROVIDER_SITE_OTHER): Payer: Self-pay | Admitting: *Deleted

## 2010-02-28 ENCOUNTER — Encounter: Payer: Self-pay | Admitting: Cardiovascular Disease

## 2010-02-28 ENCOUNTER — Encounter (INDEPENDENT_AMBULATORY_CARE_PROVIDER_SITE_OTHER): Payer: Self-pay

## 2010-02-28 DIAGNOSIS — Z7901 Long term (current) use of anticoagulants: Secondary | ICD-10-CM

## 2010-02-28 DIAGNOSIS — I80299 Phlebitis and thrombophlebitis of other deep vessels of unspecified lower extremity: Secondary | ICD-10-CM

## 2010-03-02 NOTE — Miscellaneous (Signed)
Summary: rx - lovenox   Clinical Lists Changes  Medications: Added new medication of LOVENOX 150 MG/ML SOLN (ENOXAPARIN SODIUM) inject once daily as directed per coumadin clinic - Signed Rx of LOVENOX 150 MG/ML SOLN (ENOXAPARIN SODIUM) inject once daily as directed per coumadin clinic;  #10 x 1;  Signed;  Entered by: Hoover Brunette, LPN;  Authorized by: Lewayne Bunting, MD, [facility];  Method used: Electronically to [facility] [address] [place], [place], [place]  [address], Ph: [phone], Fax: [phone]    Prescriptions: LOVENOX 150 MG/ML SOLN (ENOXAPARIN SODIUM) inject once daily as directed per coumadin clinic  #10 x 1   Entered by:   Hoover Brunette, LPN   Authorized by:   Lewayne Bunting, MD, [facility]   Signed by:   Hoover Brunette, LPN on 13/08/6576   Method used:   Electronically to        [facility]  [address] (retail)       [address]       [place], [place]  [address]       Ph: [phone]       Fax: [phone]   RxID:   [id number]

## 2010-03-02 NOTE — Medication Information (Signed)
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 no    Recent/future dental: no  Any missed doses?: no       Is patient compliant with meds? yes       Allergies: 1)  ! Minocycline Hcl (Minocycline Hcl) 2)  ! Chantix (Varenicline Tartrate) 3)  ! Prednisone  Anticoagulation Management History:      The patient is taking warfarin and comes in today for a routine follow up visit.  Negative risk factors for bleeding include an age less than 60 years old.  The bleeding index is 'low risk'.  Negative CHADS2 values include Age > 60 years old.  The start date was 04/24/2005.  Anticoagulation responsible provider: Diona Browner MD, Remi Deter.  INR POC: 3.0.  Cuvette Lot#: [id number].    Anticoagulation Management Assessment/Plan:      The patient's current anticoagulation dose is Warfarin sodium 5 mg tabs: Use as directed by Anticoagulation Clinic.  The target INR is 2.0-2.5.  The next INR is due 02/28/2010.  Anticoagulation instructions were given to patient.  Results were reviewed/authorized by Vashti Hey, RN.  She was notified by Vashti Hey RN.         Prior Anticoagulation Instructions: INR 1.9 Increase coumadin to 7.5mg  once daily except 10mg  on Mondays  Current Anticoagulation Instructions: INR 3.0 Take coumadin 5mg  tonight then resume 7.5mg  once daily except 10mg  on Mondays

## 2010-03-02 NOTE — Medication Information (Signed)
Summary: RX Folder/ PICKED UP [facility]  RX Folder/ PICKED UP [facility]   Imported By: Dorise Hiss 02/23/2010 12:11:17  _____________________________________________________________________  External Attachment:    Type:   Image     Comment:   External Document

## 2010-03-08 NOTE — Medication Information (Signed)
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 no    Recent/future dental: no  Any missed doses?: yes     Details: Missed 1 dose    Comments: Lovenox was not approved by workmans comp.  Back injection has been cancelled for now.  Allergies: 1)  ! Minocycline Hcl (Minocycline Hcl) 2)  ! Chantix (Varenicline Tartrate) 3)  ! Prednisone  Anticoagulation Management History:      The patient is taking warfarin and comes in today for a routine follow up visit.  Negative risk factors for bleeding include an age less than 31 years old.  The bleeding index is 'low risk'.  Negative CHADS2 values include Age > 38 years old.  The start date was 04/24/2005.  Anticoagulation responsible provider: Clifton James MD, Cristal Deer.  INR POC: 2.7.  Cuvette Lot#: [id number].    Anticoagulation Management Assessment/Plan:      The patient's current anticoagulation dose is Warfarin sodium 5 mg tabs: Use as directed by Anticoagulation Clinic.  The target INR is 2.0-2.5.  The next INR is due 03/16/2010.  Anticoagulation instructions were given to patient.  Results were reviewed/authorized by Vashti Hey, RN.  She was notified by Vashti Hey RN.         Prior Anticoagulation Instructions: INR 3.0 Take coumadin 5mg  tonight then resume 7.5mg  once daily except 10mg  on Mondays  Current Anticoagulation Instructions: INR 2.7 Decrease coumadin to 7.5mg  once daily  Increase salads

## 2010-03-14 ENCOUNTER — Encounter: Payer: Self-pay | Admitting: Cardiology

## 2010-03-14 ENCOUNTER — Encounter (INDEPENDENT_AMBULATORY_CARE_PROVIDER_SITE_OTHER): Payer: Self-pay

## 2010-03-14 DIAGNOSIS — Z7901 Long term (current) use of anticoagulants: Secondary | ICD-10-CM

## 2010-03-14 LAB — CONVERTED CEMR LAB: POC INR: 1.1

## 2010-03-17 NOTE — Progress Notes (Signed)
Summary: PHONE: [facility] LOVENOX       Phone Note From Other Clinic   Caller: BRIANNA WITH [facility]-  Reason for Call: Medication Check Request: Talk with Provider Details for Reason: LOVENOX MEDICATION Summary of Call: [name] called stating that the Lovenox is being denied by her insurance (Workers Compensation) She is having a surgical procedure on Tuesday 03-01-2010 and they can not do the procedure without the Lovenox. Dr. Herminio Heads is doing the surgery. His office # [id number]. Please call [facility] back at [phone] and ask for [name] or [name].   Initial call taken by: [name],  February 25, 2010 11:37 AM  Follow-up for Phone Call        Spoke with patient.  She had med sent to [facility] because [facility] did not have med & would have to order.  Informed pt. that I did try to call case worker [employer]), but had to leave message.  Their office will also be closed for President's Day on Monday, 2/20.  Advised pt to go ahead and start back on her Coumadin and still see [name] as planned on Monday.  Patient will call Dr. Toni Amend office to canell injection on 2/21.  Will try to speak with her case worker next week to try and help patient get this approaved.   Follow-up by: [name] Brunette, LPN,  February 25, 2010 5:10 PM  Additional Follow-up for Phone Call Additional follow up Details #1::        Call placed to her case worker.  Left message & awaiting call back.   [name] Brunette, LPN  March 04, 2010 9:41 AM   Return call from [name] (case worker) - stated he did approve her Lovenox on Tuesday, 2/21.  Left message with patient to return call for notification.   Additional Follow-up by: [name] Brunette, LPN,  March 04, 2010 9:55 AM    Additional Follow-up for Phone Call Additional follow up Details #2::    Patient notified.   Patient states she has already picked up.  Will call [name] back when she has new injection date  scheduled with Dr. Toni Amend office.  [name] (coumadin nurse) will be instructing her on bridging process.  Patient verbalized understanding.  Follow-up by: [name] Brunette, LPN,  March 08, 2010 2:13 PM

## 2010-03-18 ENCOUNTER — Encounter: Payer: Self-pay | Admitting: Cardiology

## 2010-03-18 ENCOUNTER — Encounter (INDEPENDENT_AMBULATORY_CARE_PROVIDER_SITE_OTHER): Payer: Self-pay

## 2010-03-18 DIAGNOSIS — I8010 Phlebitis and thrombophlebitis of unspecified femoral vein: Secondary | ICD-10-CM

## 2010-03-18 DIAGNOSIS — Z7901 Long term (current) use of anticoagulants: Secondary | ICD-10-CM

## 2010-03-21 ENCOUNTER — Encounter: Payer: Self-pay | Admitting: Cardiology

## 2010-03-21 ENCOUNTER — Encounter (INDEPENDENT_AMBULATORY_CARE_PROVIDER_SITE_OTHER): Payer: Self-pay

## 2010-03-21 DIAGNOSIS — Z7901 Long term (current) use of anticoagulants: Secondary | ICD-10-CM

## 2010-03-21 DIAGNOSIS — I80299 Phlebitis and thrombophlebitis of other deep vessels of unspecified lower extremity: Secondary | ICD-10-CM

## 2010-03-22 NOTE — Medication Information (Signed)
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 no    Recent/future dental: no  Any missed doses?: yes     Details: Took last dose coumadin 03/09/10   Started Lovenox 03/11/10  Is patient compliant with meds? yes       Allergies: 1)  ! Minocycline Hcl (Minocycline Hcl) 2)  ! Chantix (Varenicline Tartrate) 3)  ! Prednisone  Anticoagulation Management History:      The patient is taking warfarin and comes in today for a routine follow up visit.  Negative risk factors for bleeding include an age less than 68 years old.  The bleeding index is 'low risk'.  Negative CHADS2 values include Age > 25 years old.  The start date was 04/24/2005.  Anticoagulation responsible provider: Diona Browner MD, Remi Deter.  INR POC: 1.1.  Cuvette Lot#: [id number].    Anticoagulation Management Assessment/Plan:      The patient's current anticoagulation dose is Warfarin sodium 5 mg tabs: Use as directed by Anticoagulation Clinic.  The target INR is 2.0-2.5.  The next INR is due 03/18/2010.  Anticoagulation instructions were given to patient.  Results were reviewed/authorized by Vashti Hey, RN.  She was notified by Vashti Hey RN.         Prior Anticoagulation Instructions: INR 2.7 Decrease coumadin to 7.5mg  once daily  Increase salads   Current Anticoagulation Instructions: INR 1.1 Been off coumadin since 03/09/10 Started Lovenox 03/11/10 Scheduled for back injection tomorrow  Pt will resume coumadin and  Lovenox after procedure as previously instructed

## 2010-03-22 NOTE — Medication Information (Signed)
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 yes       Details: On lovenox and restarted coumadin on 03/15/10  Recent/future dental: no  Any missed doses?: no       Is patient compliant with meds? yes       Allergies: 1)  ! Minocycline Hcl (Minocycline Hcl) 2)  ! Chantix (Varenicline Tartrate) 3)  ! Prednisone  Anticoagulation Management History:      The patient is taking warfarin and comes in today for a routine follow up visit.  Negative risk factors for bleeding include an age less than 32 years old.  The bleeding index is 'low risk'.  Negative CHADS2 values include Age > 69 years old.  The start date was 04/24/2005.  Anticoagulation responsible provider: Andee Lineman MD, Michelle Piper.  INR POC: 1.4.  Cuvette Lot#: [id number].    Anticoagulation Management Assessment/Plan:      The patient's current anticoagulation dose is Warfarin sodium 5 mg tabs: Use as directed by Anticoagulation Clinic.  The target INR is 2.0-2.5.  The next INR is due 03/21/2010.  Anticoagulation instructions were given to patient.  Results were reviewed/authorized by Vashti Hey, RN.  She was notified by Vashti Hey RN.         Prior Anticoagulation Instructions: INR 1.1 Been off coumadin since 03/09/10 Started Lovenox 03/11/10 Scheduled for back injection tomorrow  Pt will resume coumadin and Lovenox after procedure as previously instructed  Current Anticoagulation Instructions: INR 1.4 Take coumadin 10mg  tonight and tomorrow night, 7.5mg   Sunday and recheck INR on Monday Continue Lovenox

## 2010-03-23 ENCOUNTER — Encounter: Payer: Self-pay | Admitting: Cardiology

## 2010-03-23 ENCOUNTER — Encounter (INDEPENDENT_AMBULATORY_CARE_PROVIDER_SITE_OTHER): Payer: Self-pay

## 2010-03-23 DIAGNOSIS — I80299 Phlebitis and thrombophlebitis of other deep vessels of unspecified lower extremity: Secondary | ICD-10-CM

## 2010-03-23 DIAGNOSIS — Z7901 Long term (current) use of anticoagulants: Secondary | ICD-10-CM

## 2010-03-29 NOTE — Medication Information (Signed)
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 no    Recent/future dental: no  Any missed doses?: no       Is patient compliant with meds? yes       Allergies: 1)  ! Minocycline Hcl (Minocycline Hcl) 2)  ! Chantix (Varenicline Tartrate) 3)  ! Prednisone  Anticoagulation Management History:      The patient is taking warfarin and comes in today for a routine follow up visit.  Negative risk factors for bleeding include an age less than 48 years old.  The bleeding index is 'low risk'.  Negative CHADS2 values include Age > 69 years old.  The start date was 04/24/2005.  Anticoagulation responsible provider: Daleen Squibb MD, Maisie Fus.  INR POC: 2.7.  Cuvette Lot#: [id number].    Anticoagulation Management Assessment/Plan:      The patient's current anticoagulation dose is Warfarin sodium 5 mg tabs: Use as directed by Anticoagulation Clinic.  The target INR is 2.0-2.5.  The next INR is due 04/04/2010.  Anticoagulation instructions were given to patient.  Results were reviewed/authorized by Vashti Hey, RN.  She was notified by Vashti Hey RN.         Prior Anticoagulation Instructions: INR 1.7 Take coumadin extra 1 1/2 tablet tonight, take 2 tablets tomorrow night then resume 1 1/2 tablets once daily  Continue Lovenox  Current Anticoagulation Instructions: INR 2.7 Take coumadin 1 tablet tomorrow then resume 1 1/2 tablets once daily  Stop Lovenox

## 2010-03-29 NOTE — Medication Information (Signed)
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 no    Recent/future dental: no  Any missed doses?: no       Is patient compliant with meds? yes       Allergies: 1)  ! Minocycline Hcl (Minocycline Hcl) 2)  ! Chantix (Varenicline Tartrate) 3)  ! Prednisone  Anticoagulation Management History:      The patient is taking warfarin and comes in today for a routine follow up visit.  Negative risk factors for bleeding include an age less than 88 years old.  The bleeding index is 'low risk'.  Negative CHADS2 values include Age > 81 years old.  The start date was 04/24/2005.  Anticoagulation responsible provider: Dietrich Pates MD, Molly Maduro.  INR POC: 1.7.  Cuvette Lot#: [id number].    Anticoagulation Management Assessment/Plan:      The patient's current anticoagulation dose is Warfarin sodium 5 mg tabs: Use as directed by Anticoagulation Clinic.  The target INR is 2.0-2.5.  The next INR is due 03/23/2010.  Anticoagulation instructions were given to patient.  Results were reviewed/authorized by Vashti Hey, RN.  She was notified by Vashti Hey RN.         Prior Anticoagulation Instructions: INR 1.4 Take coumadin 10mg  tonight and tomorrow night, 7.5mg  Sunday and recheck INR on Monday Continue Lovenox    Current Anticoagulation Instructions: INR 1.7 Take coumadin extra 1 1/2 tablet tonight, take 2 tablets tomorrow night then resume 1 1/2 tablets once daily  Continue Lovenox

## 2010-03-30 LAB — CARDIAC PANEL(CRET KIN+CKTOT+MB+TROPI)
CK, MB: 5.3 ng/mL — ABNORMAL HIGH (ref 0.3–4.0)
Relative Index: INVALID (ref 0.0–2.5)
Total CK: 60 U/L (ref 7–177)
Troponin I: 0.95 ng/mL (ref 0.00–0.06)

## 2010-03-30 LAB — COMPREHENSIVE METABOLIC PANEL
ALT: 16 U/L (ref 0–35)
AST: 18 U/L (ref 0–37)
Albumin: 2.9 g/dL — ABNORMAL LOW (ref 3.5–5.2)
Alkaline Phosphatase: 69 U/L (ref 39–117)
BUN: 11 mg/dL (ref 6–23)
CO2: 30 mEq/L (ref 19–32)
Calcium: 7.9 mg/dL — ABNORMAL LOW (ref 8.4–10.5)
Chloride: 104 mEq/L (ref 96–112)
Creatinine, Ser: 0.8 mg/dL (ref 0.4–1.2)
GFR calc Af Amer: >60 mL/min (ref >60)
GFR calc non Af Amer: >60 mL/min (ref >60)
Glucose, Bld: 86 mg/dL (ref 70–99)
Potassium: 3.9 mEq/L (ref 3.5–5.1)
Sodium: 138 mEq/L (ref 135–145)
Total Bilirubin: 0.5 mg/dL (ref 0.3–1.2)
Total Protein: 5.4 g/dL — ABNORMAL LOW (ref 6.0–8.3)

## 2010-03-30 LAB — CBC
HCT: 44.6 % (ref 36.0–46.0)
HCT: 45.8 % (ref 36.0–46.0)
HCT: 45.9 % (ref 36.0–46.0)
HCT: 50.4 % — ABNORMAL HIGH (ref 36.0–46.0)
Hemoglobin: 14 g/dL (ref 12.0–15.0)
Hemoglobin: 14.5 g/dL (ref 12.0–15.0)
Hemoglobin: 14.9 g/dL (ref 12.0–15.0)
Hemoglobin: 15 g/dL (ref 12.0–15.0)
Hemoglobin: 17.2 g/dL — ABNORMAL HIGH (ref 12.0–15.0)
MCHC: 33.4 g/dL (ref 30.0–36.0)
MCHC: 33.5 g/dL (ref 30.0–36.0)
MCHC: 34.2 g/dL (ref 30.0–36.0)
MCHC: 34.2 g/dL (ref 30.0–36.0)
MCV: 96.1 fL (ref 78.0–100.0)
MCV: 98.3 fL (ref 78.0–100.0)
MCV: 98.4 fL (ref 78.0–100.0)
Platelets: 181 10*3/uL (ref 150–400)
Platelets: 230 10*3/uL (ref 150–400)
RBC: 4.28 MIL/uL (ref 3.87–5.11)
RBC: 4.43 MIL/uL (ref 3.87–5.11)
RBC: 4.53 MIL/uL (ref 3.87–5.11)
RBC: 4.67 MIL/uL (ref 3.87–5.11)
RBC: 5.24 MIL/uL — ABNORMAL HIGH (ref 3.87–5.11)
RDW: 14.2 % (ref 11.5–15.5)
RDW: 14.3 % (ref 11.5–15.5)
RDW: 14.4 % (ref 11.5–15.5)
WBC: 7.5 10*3/uL (ref 4.0–10.5)
WBC: 7.7 10*3/uL (ref 4.0–10.5)

## 2010-03-30 LAB — LIPID PANEL
Cholesterol: 212 mg/dL — ABNORMAL HIGH (ref 0–200)
HDL: 33 mg/dL — ABNORMAL LOW (ref >39)
LDL Cholesterol: 154 mg/dL — ABNORMAL HIGH (ref 0–99)
Total CHOL/HDL Ratio: 6.4 RATIO
Triglycerides: 126 mg/dL (ref <150)
VLDL: 25 mg/dL (ref 0–40)

## 2010-03-30 LAB — APTT
aPTT: 200 seconds (ref 24–37)
aPTT: >200 seconds (ref 24–37)

## 2010-03-30 LAB — BASIC METABOLIC PANEL
BUN: 19 mg/dL (ref 6–23)
CO2: 28 mEq/L (ref 19–32)
CO2: 30 mEq/L (ref 19–32)
CO2: 31 mEq/L (ref 19–32)
CO2: 32 mEq/L (ref 19–32)
Calcium: 8.2 mg/dL — ABNORMAL LOW (ref 8.4–10.5)
Calcium: 8.9 mg/dL (ref 8.4–10.5)
Chloride: 102 mEq/L (ref 96–112)
Chloride: 97 mEq/L (ref 96–112)
Creatinine, Ser: 0.86 mg/dL (ref 0.4–1.2)
Creatinine, Ser: 0.96 mg/dL (ref 0.4–1.2)
GFR calc Af Amer: >60 mL/min (ref >60)
GFR calc Af Amer: >60 mL/min (ref >60)
GFR calc Af Amer: >60 mL/min (ref >60)
GFR calc non Af Amer: >60 mL/min (ref >60)
GFR calc non Af Amer: >60 mL/min (ref >60)
Glucose, Bld: 127 mg/dL — ABNORMAL HIGH (ref 70–99)
Glucose, Bld: 91 mg/dL (ref 70–99)
Potassium: 3.9 mEq/L (ref 3.5–5.1)
Potassium: 4.1 mEq/L (ref 3.5–5.1)
Sodium: 137 mEq/L (ref 135–145)
Sodium: 138 mEq/L (ref 135–145)

## 2010-03-30 LAB — URINE MICROSCOPIC-ADD ON

## 2010-03-30 LAB — POCT CARDIAC MARKERS
CKMB, poc: <1 ng/mL — ABNORMAL LOW (ref 1.0–8.0)
Myoglobin, poc: 38.8 ng/mL (ref 12–200)
Troponin i, poc: <0.05 ng/mL (ref 0.00–0.09)

## 2010-03-30 LAB — DIFFERENTIAL
Basophils Absolute: 0 10*3/uL (ref 0.0–0.1)
Basophils Relative: 1 % (ref 0–1)
Basophils Relative: 1 % (ref 0–1)
Eosinophils Absolute: 0.2 10*3/uL (ref 0.0–0.7)
Eosinophils Relative: 2 % (ref 0–5)
Eosinophils Relative: 4 % (ref 0–5)
Lymphocytes Relative: 23 % (ref 12–46)
Monocytes Absolute: 0.4 10*3/uL (ref 0.1–1.0)
Monocytes Absolute: 0.5 10*3/uL (ref 0.1–1.0)
Monocytes Relative: 5 % (ref 3–12)
Neutrophils Relative %: 66 % (ref 43–77)

## 2010-03-30 LAB — HEMOGLOBIN A1C
Hgb A1c MFr Bld: 6.1 % (ref 4.6–6.1)
Mean Plasma Glucose: 128 mg/dL

## 2010-03-30 LAB — URINALYSIS, ROUTINE W REFLEX MICROSCOPIC
Bilirubin Urine: NEGATIVE
Glucose, UA: NEGATIVE mg/dL
Ketones, ur: NEGATIVE mg/dL
pH: 6.5 (ref 5.0–8.0)

## 2010-03-30 LAB — PROTIME-INR
INR: 1.65 — ABNORMAL HIGH (ref 0.00–1.49)
INR: 1.72 — ABNORMAL HIGH (ref 0.00–1.49)
INR: 1.79 — ABNORMAL HIGH (ref 0.00–1.49)
INR: >10 (ref 0.00–1.49)
Prothrombin Time: 19.4 seconds — ABNORMAL HIGH (ref 11.6–15.2)
Prothrombin Time: 20 seconds — ABNORMAL HIGH (ref 11.6–15.2)
Prothrombin Time: 20.6 seconds — ABNORMAL HIGH (ref 11.6–15.2)
Prothrombin Time: 23.7 seconds — ABNORMAL HIGH (ref 11.6–15.2)
Prothrombin Time: >90 seconds — ABNORMAL HIGH (ref 11.6–15.2)

## 2010-03-30 LAB — MAGNESIUM: Magnesium: 1.8 mg/dL (ref 1.5–2.5)

## 2010-03-30 LAB — BRAIN NATRIURETIC PEPTIDE: Pro B Natriuretic peptide (BNP): <30 pg/mL (ref 0.0–100.0)

## 2010-03-30 LAB — HEPARIN LEVEL (UNFRACTIONATED): Heparin Unfractionated: 0.76 IU/mL — ABNORMAL HIGH (ref 0.30–0.70)

## 2010-03-30 LAB — MRSA PCR SCREENING: MRSA by PCR: NEGATIVE

## 2010-04-01 ENCOUNTER — Encounter: Payer: Self-pay | Admitting: Cardiology

## 2010-04-01 DIAGNOSIS — I2699 Other pulmonary embolism without acute cor pulmonale: Secondary | ICD-10-CM

## 2010-04-01 DIAGNOSIS — Z7901 Long term (current) use of anticoagulants: Secondary | ICD-10-CM

## 2010-04-01 DIAGNOSIS — D6851 Activated protein C resistance: Secondary | ICD-10-CM

## 2010-04-04 ENCOUNTER — Ambulatory Visit (INDEPENDENT_AMBULATORY_CARE_PROVIDER_SITE_OTHER): Payer: Self-pay | Admitting: *Deleted

## 2010-04-04 DIAGNOSIS — Z7901 Long term (current) use of anticoagulants: Secondary | ICD-10-CM

## 2010-04-04 DIAGNOSIS — I2699 Other pulmonary embolism without acute cor pulmonale: Secondary | ICD-10-CM

## 2010-04-04 DIAGNOSIS — D6851 Activated protein C resistance: Secondary | ICD-10-CM

## 2010-04-04 DIAGNOSIS — D6859 Other primary thrombophilia: Secondary | ICD-10-CM

## 2010-04-04 LAB — POCT INR: INR: 2

## 2010-04-25 ENCOUNTER — Encounter: Payer: Self-pay | Admitting: *Deleted

## 2010-04-25 ENCOUNTER — Encounter (INDEPENDENT_AMBULATORY_CARE_PROVIDER_SITE_OTHER): Payer: Self-pay | Admitting: *Deleted

## 2010-04-25 DIAGNOSIS — D6859 Other primary thrombophilia: Secondary | ICD-10-CM

## 2010-04-25 DIAGNOSIS — Z7901 Long term (current) use of anticoagulants: Secondary | ICD-10-CM

## 2010-04-25 DIAGNOSIS — I2699 Other pulmonary embolism without acute cor pulmonale: Secondary | ICD-10-CM

## 2010-04-27 ENCOUNTER — Ambulatory Visit (INDEPENDENT_AMBULATORY_CARE_PROVIDER_SITE_OTHER): Payer: Self-pay | Admitting: *Deleted

## 2010-04-27 DIAGNOSIS — I2699 Other pulmonary embolism without acute cor pulmonale: Secondary | ICD-10-CM

## 2010-05-24 NOTE — Assessment & Plan Note (Signed)
[facility]                          [facility]   NAME:Mcavoy, Synetta Fail                       MRN:          [id number]  DATE:04/22/2007                            DOB:          1950/08/14    REFERRING PHYSICIAN:  Barbette Hair. Artist Pais, DO   HISTORY OF PRESENT ILLNESS:  The patient is a 60 year old female with a  history of single-vessel coronary artery disease.  The patient has been  doing well.  She reports no substernal chest pain.  She has dyspnea on  exertion which is stable second to underlying COPD.  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   MEDICATIONS:  Coumadin, Synthroid, Mevacor, atenolol, Spiriva, aspirin,  Niaspan Advair, multivitamin, fish oil.   PHYSICAL EXAMINATION:  VITAL SIGNS:  Blood pressure 132/79, heart rate  87.  Weight 207 pounds.  NECK:  Normal carotid upstroke.  No carotid bruits.  LUNGS:  Diminished breath sounds bilaterally, but no wheezing.  HEART:  Regular rate and rhythm.  Normal S1-S2.  ABDOMEN:  Soft.  EXTREMITIES:  No cyanosis, clubbing or edema.  NEURO:  Patient alert, oriented and grossly nonfocal.   PROBLEMS:  1. Chronic dyspnea, (secondary to chronic lung disease).  2. Coronary artery disease.      a.     Status post stent to right coronary artery x3.      b.     PCI for restenosis __________  negative for ischemia.  3. Chronic obstructive pulmonary disease.  4. Sleep apnea.  5. History of did deep venous thrombosis and factor V Leiden gene      mutation.   PLAN:  1. The patient will continue on Coumadin.  We will adjust her Coumadin      dosing.  2. The patient will be scheduled for an exercise treadmill test for      her planned DOT physical.  3. 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    Learta Codding, MD,FACC  Electronically Signed   GED/MedQ  DD: 04/22/2007  DT: 04/22/2007  Job #: [id number]   cc:   Barbette Hair. Artist Pais, DO

## 2010-05-24 NOTE — Assessment & Plan Note (Signed)
[facility]                          [facility]   NAME:[name], [name]                       MRN:          [id number]  DATE:03/20/2008                            DOB:          1950/02/03    PRIMARY CARDIOLOGIST:  Learta Codding, MD,FACC   REASON FOR VISIT:  Annual followup.   HISTORY OF PRESENT ILLNESS:  [name] is a 60 year old female with  history of severe, single-vessel coronary artery disease, who now  presents for routine followup.  When last seen here in April 2009, she  was referred for a routine stress test, as per DOT physical protocol.  This was an exercise stress echocardiogram, which was negative for  evidence of ischemia.   Since then, [name] denies any interim development of any  signs/symptoms suggestive of unstable angina pectoris.  Unfortunately,  she continues to smoke.  She cites significant side effects with Chantix  in the past.   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   EKG in the office today indicates NSR at 90 bpm, with no ischemic  changes.   CURRENT MEDICATIONS:  1. Coumadin, as directed.  2. Synthroid 0.1 mg daily.  3. Mevacor 40 daily.  4. Atenolol 25 daily.  5. Spiriva 1 capsule daily.  6. Aspirin 81 daily.  7. Niaspan 500 at bedtime.  8. Advair 250/50 one puff b.i.d.  9. Fish oil 1000 daily.   PHYSICAL EXAMINATION:  VITAL SIGNS:  Blood pressure 128/77, pulse 87,  regular, and weight 203.  GENERAL:  A 60 year old female, sitting upright, in no distress.  HEENT:  Normocephalic and atraumatic.  NECK:  Palpable carotid pulses; no bruits.  LUNGS:  Diminished breath sounds throughout, but without crackles or  wheezes.  HEART:  Regular rate and rhythm.  Positive S4.  No significant murmurs.  No rubs.  ABDOMEN:  Soft and nontender.  EXTREMITIES:  No significant edema.  NEUROLOGIC:  No focal deficit.   IMPRESSION:  1. Severe  single-vessel coronary artery disease.      a.     Status initial cutting balloon PTCA, followed by elective       brachytherapy of high-grade, ostial in-stent restenosis of RCA,       May 2003.      b.     Status post NSTEMI/stenting ostial RCA, December 2002.  2. Preserved LVF.  3. COPD/ongoing tobacco.  4. History of DVT, on chronic Coumadin.  a  Secondary to factor V Leiden gene mutation.  1. Obstructive sleep apnea.  2. Dyslipidemia.   PLAN:  1. Schedule Lexiscan stress Cardiolite for risk stratification.  The      patient is unable to walk at this time, secondary to her recent      fall and fracture of the right hip.  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  2. Reassess lipid status at that time with a fasting lipid/liver  profile.  Continued aggressive management is recommended, with      target LDL of 70 or less, if possible.  3. The patient is advised to reestablish with her primary care      physician.  She has also been, once again, strongly advised to stop      smoking tobacco.  4. Schedule return clinic follow up with myself and DeGent in 1 year,      or sooner as needed.      Gene Serpe, PA-C  Electronically Signed      Learta Codding, MD,FACC  Electronically Signed   GS/MedQ  DD: 03/20/2008  DT: 03/21/2008  Job #: [id number]

## 2010-05-24 NOTE — Assessment & Plan Note (Signed)
[facility]                          [facility]   NAME:Shrum, [name]                       MRN:          [id number]  DATE:07/30/2006                            DOB:          10-23-1950    HISTORY OF PRESENT ILLNESS:  The patient is a 60 year old female with a  history of single-vessel coronary artery disease. The patient has also  underlying lung disease with recent CT scan and followup demonstrating  scarring and fibrotic change, both of the pleural and the lung  parenchyma. She is followed closely by Dr. Shelle Iron. She states on a  recent trip to the [facility] she felt that she had pain in the left  hemithorax, particularly when lying on the left side. This is now  improved. She denies any chest pain, palpitations or syncope.   MEDICATIONS:  1. Coumadin as directed.  2. Synthroid 100 mcg p.o. daily.  3. Mevacor 40 mg p.o. daily.  4. Atenolol 25 daily.  5. Spiriva 18 mcg p.o. daily.  6. Aspirin 81 mg a day.  7. Niaspan.  8. Advair.   PHYSICAL EXAMINATION:  VITAL SIGNS:  Blood pressure 125/71, heart rate  85, weight 228 pounds.  NECK EXAM:  Normal carotid upstrokes. No carotid bruits.  LUNGS:  Diminished breath sounds bilaterally with no wheezing.  HEART:  Regular rate and rhythm. Normal S1 and S2. No murmurs, rubs, or  gallops.  ABDOMEN:  Soft.  EXTREMITIES:  No clubbing, cyanosis, or edema.  NEUROLOGICAL:  The patient is alert, oriented and grossly nonfocal.   Twelve lead electrocardiogram:  Normal sinus rhythm. No acute ischemic  changes.   PROBLEM LIST:  1. Dyspnea.      a.     Status post previous pneumonia.      b.     Abnormal CT scan with fibrosis and pleural thickening.      c.     Followup by Dr. Marcelyn Bruins.  2. Coronary artery disease.      a.     Status post stent to the right coronary artery in 2003.      b.     PCI  for restenosis.      c.     Myoview April 2007 and exercise treadmill test 2008.  3.  Chronic obstructive pulmonary disease.  4. Sleep apnea followed by Dr. Shelle Iron.  5. Status post lower extremity venous thrombosis and pulmonary      embolism.      a.     Coumadin therapy.      b.     Positive for lupus anticoagulant.      c.     Positive for Factor V Leiden gene mutation.      d.     Positive family history for hypercoagulability.      e.     History of lower extremity deep vein thrombosis.   PLAN:  1. The patient is doing well from a cardiovascular perspective. She      has no recurrence of sternal chest pain.  2. The patient can follow up with  [place] in six months. She will continue      to follow up with Dr. Shelle Iron regarding her abnormal CT.     Learta Codding, MD,FACC  Electronically Signed    GED/MedQ  DD: 07/30/2006  DT: 07/31/2006  Job #: [id number]

## 2010-05-25 ENCOUNTER — Ambulatory Visit (INDEPENDENT_AMBULATORY_CARE_PROVIDER_SITE_OTHER): Payer: Self-pay | Admitting: *Deleted

## 2010-05-25 DIAGNOSIS — I2699 Other pulmonary embolism without acute cor pulmonale: Secondary | ICD-10-CM

## 2010-05-25 DIAGNOSIS — Z7901 Long term (current) use of anticoagulants: Secondary | ICD-10-CM

## 2010-05-25 DIAGNOSIS — D6859 Other primary thrombophilia: Secondary | ICD-10-CM

## 2010-05-25 DIAGNOSIS — D6851 Activated protein C resistance: Secondary | ICD-10-CM

## 2010-05-27 NOTE — Assessment & Plan Note (Signed)
[facility]                          [facility]   NAME:[name] [name]                       MRN:          [id number]  DATE:12/26/2005                            DOB:          March 23, 1950    HISTORY OF PRESENT ILLNESS:  Patient is a 60 year old female with  coronary artery disease, status post stent placement in right coronary  artery 2003.  The patient was recently admitted for a pneumonia to  [facility].  I suspect in retrospect that the patient may well  have had Mycoplasma pneumoniae.  She was very hypoxic on admission, had  severe pleuritic chest pain.  Much of this has now improved.  The  patient reports no recurrent fevers or chills.  Her breathing is back to  baseline.  She is not interested in discontinuing tobacco use.  During  her admission on chest x-ray, the patient was found to have an  infiltrate.  A CT scan was done and a curvilinear density was noted on  the left lower lobe, possibly consistent with pneumonia and/or  subsegmental atelectasis.  Recommendation was given to proceed with a CT  scan in 1 month.  Patient will also have an INR in the office today.   MEDICATIONS:  1. Coumadin as directed.  2. Synthroid 100 mcg p.o. daily.  3. Mevacor 40 mg daily.  4. Atenolol 25 daily.  5. Spiriva.  6. Aspirin 81 mg daily.  7. Niaspan 5 mg p.o. at bedtime.  8. Advair.   PHYSICAL EXAMINATION:  VITAL SIGNS:  Blood pressure is 130/74.  Heart  rate is 80 beats per minute.  NECK:  Normal carotid upstroke.  No carotid bruits.  LUNGS:  Clear breath sounds, somewhat diminished but no wheezing.  HEART:  Regular rate and rhythm.  Normal S1, S2.  No murmurs, rubs or  gallops.  ABDOMEN:  Soft, nontender.  EXTREMITIES:  No cyanosis, clubbing or edema.   PROBLEM LIST:  1. Dyspnea resolved.      a.     Status post presumed Mycoplasma pneumoniae.      b.     Infiltrate left lower lobe.      c.     Follow up CT scan  recommended.  2. Coronary artery disease.      a.     Status post stent to right coronary artery in 2003.      b.     Brachytherapy for restenosis.      c.     Myoview April 2007, negative for ischemia.  3. Chronic obstructive pulmonary disease.  4. Sleep apnea followed by Dr. Debbrah Alar  5. Status post lower extremity venous thrombosis, pulmonary embolism.      a.     Coumadin therapy.      b.     Positive for lupus anticoagulant.      c.     Positive for factor V Leiden gene mutation.      d.     Positive family history of hypercoagulability (history of       deep vein thrombosis and stroke  in a daughter as well as a       sister).      e.     History of lower extremity deep venous thrombosis.   PLAN:  1. I suspect the patient had Mycoplasma pneumoniae.  She still has      residual left-sided  pleuritic pain, in particular when she lies on      the left side.  I have told her to continue Tylenol at the present      time.  2. The patient will have a followup CT scan done in 1 month, in      January to reevaluate the left lower lobe infiltrate.  3. Patient also interested in discontinuing tobacco and will start      Chantix.  4. Patient will follow up with me in 6 months.     [name] Codding, MD,FACC  Electronically Signed    GED/MedQ  DD: 12/26/2005  DT: 12/26/2005  Job #: [id number]   cc:   Joni Fears D. Maple Hudson, MD, FCCP, FACP

## 2010-05-27 NOTE — Cardiovascular Report (Signed)
[place]  Patient:    ARMEDA, PLUMB Visit Number: [id number] MRN: [id number]          Service Type: MED Location: [id number] Attending Physician:  Lewayne Bunting Md Dictated by:  Daisey Must, M.D. [facility] Proc. Date: 05/24/01 Admit Date:  05/24/2001 Discharge Date: 05/25/2001   CC:         Vikki Ports, M.D.             Lewayne Bunting, M.D. [facility]             Cardiac Cath Lab   Cardiac Catheterization  PROCEDURE PERFORMED: 1. Left heart catheterization with coronary angiography and left    ventriculography. 2. PTCA utilizing the cutting balloon of the ostial right coronary artery for    InStent restenosis.  INDICATIONS:  The patient is a 60 year old woman who previously underwent stentplacement in the ostium of the right coronary artery in December of last year.  She presented to the office today with two weeks of progressive chest pain including episodes of chest pain occurring while at rest.  She was admitted to the hospital and referred for catheterization.  PROCEDURAL NOTE:  A 6-French sheath was placed in the right femoral artery. Standard Judkins 6-French catheters were utilized.  Contrast was Omnipaque. There were no complications.  RESULTS:  HEMODYNAMICS:  Left ventricular pressure 152/20, aortic pressure 152/82. There is no aortic valve gradient.  LEFT VENTRICULOGRAM:  Wall motion is normal.  Ejection fraction calculated at 69%.  There is no mitral regurgitation.  CORONARY ANGIOGRAPHY (RIGHT DOMINANT):  Left main has an ostial 30% stenosis.  Left anterior descending artery has diffuse luminal irregularities in the mid to distal vessel.  The LAD gives rise to two small diagonal branches.  Left circumflex gives rise to a normal size ramus intermediate, normal size OM1, and a small OM2.  There is a tubular 40% stenosis at the origin of the left circumflex.  There is a 20% stenosis in the proximal portion of the  ramus intermediate.  Right coronary artery is a dominant vessel.  There is a stent in the ostium and proximal portion of the right coronary artery.  There is a 99% focal InStent restenosis at the ostium of the right coronary artery, compromising the proximal portion of the stent.  Just beyond the stent, there is a 20% stenosis in the proximal vessel.  In the mid vessel, there are diffuse luminal irregularities.  The distal right coronary artery gives rise to a normal size posteriordescending artery, a small first posterolateral branch, and a normal size second posterolateral branch.  IMPRESSION: 1. Normal left ventricular systolic function. 2. One vessel coronary artery disease characterized by 99% InStent restenosis    in the ostium of the right coronary artery.  PLAN:  These findings were reviewed with Dr. Juanda Chance.  Because the patient has had pain at rest, we felt the patient would be best served with immediate percutaneous intervention of the ostium of the right coronary artery. Following this, if all goes well, the plan is to bring her back for intracoronary brachytherapy when that is available in approximatelyfour days.  PTCA PROCEDURAL NOTE:  Following completion of the diagnostic catheterization, we proceeded with percutaneous coronary intervention.  The 6-French sheath in the right femoral artery was exchanged over for a 7-French sheath.  Heparin and Integrilin were administered per protocol.  We used a 7-French hockey stick guiding catheter with side holes anda BMW wire.  PTCA of  the ostium of the right coronary artery was performed with a 3.5 x 10 mm cutting balloon, inflated to 8, 10, 10, and 10 atmospheres respectively.  Intermittent doses of intracoronary nitroglycerin were administered to relieve coronary vasospasm. Final angiographic images revealed patency of the right coronary artery, a 25% residual stenosis, and TIMI-3 flow.  COMPLICATIONS:  None.  RESULTS:   Successful percutaneous transluminal coronary angioplasty utilizing cutting balloon of the ostium of the right coronary artery.  A 99% InStent restenosis was reduced to 25% residual with TIMI-3 flow.  PLAN:  Integrilin will be continued for 18 hours.  Plavix will be administered for a minimum of six months.  The patient will be brought back electively in four days for intracoronary brachytherapy to prevent recurrent restenosis. Dictated by: Daisey Must, M.D. [facility] Attending Physician:  Lewayne Bunting Md DD:  05/24/01 TD:  05/28/01 Job: [id number]

## 2010-05-27 NOTE — Cardiovascular Report (Signed)
[place]  Patient:    [name], [name] Visit Number: [id number] MRN: [id number]          Service Type: MED Location: [id number] Attending Physician:  Ronaldo Miyamoto Dictated by:   Everardo Beals Juanda Chance, M.D. [facility] Proc. Date: 12/25/00 Admit Date:  12/24/2000   CC:         Vikki Ports, M.D., Angelita Ingles D. Riley Kill, M.D. [facility]  Theron Arista C. Eden Emms, M.D. [facility]  Cardiopulmonary Laboratory   Cardiac Catheterization  PROCEDURES PERFORMED: Percutaneous coronary intervention.  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 She subsequently had positive enzymes consistent with a non-Q-wave myocardial infarction. She also gave a history of three syncopal spells. She was transferred to [place] and arrangements were made for her to be elevated with coronary angiography. This was performed earlier today by Dr. Eden Emms, who found a 90% stenosis from the ostium of the right coronary artery. She had 30% ostial narrowing in the intermediate and circumflex arteries. Her left ventricular function was normal.  A decision was made to treat her with percutaneous intervention of the right coronary artery.  DESCRIPTION OF PROCEDURE: The procedure was performed via the right femoral artery using an arterial sheath and 7 [place] JR4 guiding catheter with side holes. The patient was given weight-adjusted heparin to prolong the ACT to greater than 200 seconds and was given double bolus Integrilin and infusion. We crossed the lesion in the ostium of the right coronary artery with a short luge wire without difficulty. We predilated with a 3.5 x 15 mm Quantum Maverick balloon performing two inflations up to 14 atmospheres for 30 seconds. We were not satisfied with our guide support in terms of positioning the stent coaxial to the ostium so we changed guiding catheters  to a 7 [place] hockey-stick guiding catheter with side holes and recrossed the lesion. We positioned the stent so the proximal edge was just at the ostium and deployed a 3.5 x 12 mm Express II with two inflations of 12 and 14 atmospheres for 34 and 32 seconds. Repeat diagnostic studies were then performed with the guiding catheter. The patient tolerated the procedure well and left the laboratory in satisfactory condition.  RESULTS: Initially the stenosis at the ostium of the right coronary artery was estimated at 90%. Following stenting this improved to less than 10%.  CONCLUSIONS: Successful stenting of the ostial lesion of the right coronary artery with improvement in percent diameter narrowing from 90% to less than 10%.  DISPOSITION: The patient was returned to the postangioplasty unit for further observation. Dictated by:   Everardo Beals Juanda Chance, M.D. [facility] Attending Physician:  Ronaldo Miyamoto DD:  12/25/00 TD:  12/26/00 Job: [id number]

## 2010-05-27 NOTE — Discharge Summary (Signed)
[place]  Patient:    [name], [name] Visit Number: [id number] MRN: [id number]          Service Type: MED Location: [id number] Attending Physician:  Lewayne Bunting Md Dictated by:  Delton See, P.A. Admit Date:  05/24/2001 Disc. Date: 05/25/01   CC:         Vikki Ports, M.D.   Referring Physician Discharge [facility]  DATE OF BIRTH:  1950/02/13  BRIEF HISTORY:  This is a 60 year old female with a history of coronary artery disease with previous catheterization revealing single-vessel coronary artery disease with PTCA stenting of the ostial right coronary artery lesion.  This was performed by Dr. Juanda Chance.  I do not have the complete details regarding the procedure at this time.  The patient did have a follow-up stress Cardiolite on January 22, 2001 that showedno evidence of ischemia on nuclear scanning.  The ejection fraction was 66% at that time.  The patient was seen in the office by Dr. Andee Lineman on May 24, 2001 with episodes of substernal chest pain at rest similar toher previous pain.  She was admitted to [facility] for further evaluation.  PAST MEDICAL HISTORY:  Please see cardiac history as noted above.  The patient does have a history of elevated lipids.  She does have a history of tobacco use.  She does have hypertension.  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  SOCIAL HISTORY:  The patient is a former Naval architect.  She continues to smoke.  She drinks alcohol occasionally.  FAMILY HISTORY:  Significant for coronary artery disease.  HOSPITAL COURSE:  As noted, this patientwas admitted to [facility] for cardiac catheterization to further evaluate symptoms of coronary artery disease.  The catheterization wasperformed on May 24, 2001 by Dr. Gerri Spore. The patient was found to have normal left ventricular function.  She had a 99% in-stent restenosis of the ostial RCA  lesion.  The patient underwent percutaneous interventionof the above-noted lesion, reducing it from 99% to approximately 25% with TIMI 3 flow.  It was recommended that she stay on Plavix for six months.  It was also recommended that she return the following Tuesday for brachytherapy.  Arrangements were made to discharge the patient to home on May 25, 2001 in improved condition.  LABORATORY DATA:  A chemistry profile on May 17 revealed BUN 11, creatinine 0.8, potassium 3.8, calcium 8.2. A CBC on May 17 was within normal limits with hemoglobin 13.2; hematocrit 39; wbcs 7.3; platelets 288,000.  A troponin I was 0.04 on May 16.  A chest x-ray showed left basilar subsegmental atelectasis versus infiltrate. A chest CT was also performed which was negative for pulmonary embolus, again with the above-noted left basilar subsegmental atelectasis. Her EKG showed normal sinus rhythm, rate 91 beats per minute, with poorR wave progression and nonspecific ST-T wave changes.  DISCHARGEMEDICATIONS: 1. Aspirin 81 mg daily. 2. Plavix 75 mg daily. 3. Nitroglycerin 0.4 mg sublingually p.r.n. 4. Pravachol 20 mg at bedtime. 5. Bisoprolol 5 mg daily.  ACTIVITY:  The patient was told to avoid any strenuous activity or heavy lifting for at least two days.  DIET:  Low salt/low fat diet.  WOUND CARE:  She was told to call the office if she had any problems with her catheterization site.  FOLLOW-UP:  She was to return to the short-stay unit section C on Tuesday, May 28, 2001 at 11 a.m. for the  brachytherapy as noted above.  PROBLEM LIST AT TIME OF DISCHARGE: 1. Coronary artery disease; history of single-vessel coronary disease with    previous percutaneous transluminal coronaryangioplasty stenting of the    right coronary artery with reocclusion andredo percutaneous intervention    on May 24, 2001 as noted above, with plans for brachytherapy on May 28, 2001. 2. Normal left ventricular function at  time of catheterization. 3. History of elevated lipids. 4. History of hypertension. 5. History of tobacco use. Dictated by:   Delton See, P.A. Attending Physician:  Lewayne Bunting Md DD:  05/25/01 TD:  05/25/01 Job: [id number]

## 2010-05-27 NOTE — Discharge Summary (Signed)
[place]. [facility]  Patient:    [name], [name] Visit Number: [id number] MRN: [id number]          Service Type: CAT Location: [id number] Attending Physician:  Daisey Must Dictated by:   Lavella Hammock, P.A. Admit Date:  05/28/2001 Disc. Date: 05/29/01   CC:         Lewayne Bunting, M.D. [facility]  Vikki Ports, M.D.   Referring Physician Discharge [facility]  DATE OF BIRTH:  2050/12/25  PROCEDURES: 1. Cardiac catheterization. 2. Coronary arteriogram. 3. Brachytherapy to one vessel. 4. Percutaneous transluminal coronary angioplasty to one vessel.  HISTORY OF PRESENT ILLNESS:  [name] is a 60 year old female with a history of coronary artery disease, who had PTCA to her ostial RCA for end-stent restenosis on May 24, 2001.  She was readmitted on May 28, 2001, for brachytherapy to that vessel for end-stent restenosis.  HOSPITAL COURSE:  The patient had successful brachytherapy to the ostial RCA, reducing the stenosis to less than 25% with TIMI-3 flow.  She was placed on Integrilin for 12 hours and aspirin and Plavix.  She is to continue the Plavix for 12 months.  The next day, her groin was stable and her lungs were clear. Her blood pressure was elevated and her medications were adjusted.  She was considered stable for discharge on May 29, 2001.  LABORATORY DATA:  Postprocedure CK-MB negative.  Hemoglobin 12.8, hematocrit 37.8, WBC 7.9, platelets 297.  Sodium 140, potassium 3.8, chloride 106, CO2 31, BUN 16, creatinine 0.8, glucose 104.  DISCHARGE CONDITION:  Improved.  DISCHARGE DIAGNOSES: 1. Coronary artery disease, status post percutaneous transluminal coronary    angioplasty to the right coronary artery on May 24, 2001, with    brachytherapy on May 28, 2001, for end-stent restenosis. 2. Status post percutaneous transluminal coronary angioplasty and stent to the    right coronary artery at the ostium in January of 2003. 3. Preserved left  ventricular function with an ejection fraction of 66% by    nuclear imaging in January of 2003. 4. Hyperlipidemia. 5. History of tobacco use. 6. Family history of coronary artery disease. 7. Hypertension. 8. Myopathy with Zocor, tolerating Pravachol.  ACTIVITY:  Her activity level is to include no driving or sexual or strenuous activity for two days.  DIET:  She is to stick to a low-fat diet.  WOUND CARE:  She is to call the office for problems with the catheterization site.  FOLLOW-UP:  She is to follow up with Vikki Ports, M.D., as needed.  She has a P.A. appointment on June 12, 2001, at 12:30 p.m. and an appointment with Lewayne Bunting, M.D., on July 29, 2001, at 12:15 p.m.  DISCHARGE MEDICATIONS: 1. Coated aspirin 325 mg q.d. 2. Plavix 75 mg q.d. x 6 months. 3. Pravachol 20 mg q.d. 4. Lopressor 25 mg b.i.d. 5. Nitroglycerin 0.4 mg p.r.n. Dictated by:   Lavella Hammock, P.A.  Attending Physician:  Daisey Must DD:  05/29/01 TD:  05/29/01 Job: [id number]

## 2010-05-27 NOTE — Discharge Summary (Signed)
NAMEJEMIMAH, [name] NO.:  [id number]   MEDICAL RECORD NO.:  [id number]          PATIENT TYPE:  INP   LOCATION:  3702                         FACILITY:  [facility]   PHYSICIAN:  Marcelyn Bruins, M.D. [facility] DATE OF BIRTH:  1950-12-14   DATE OF ADMISSION:  06/15/2004  DATE OF DISCHARGE:  06/24/2004                                 DISCHARGE SUMMARY   DISCHARGE DIAGNOSES:  1.  Acute pulmonary embolus with lupus anticoagulant as well as deep venous      thrombosis.  2.  Moderate chronic obstructive pulmonary disease.   LABORATORY DATA/DIAGNOSTIC DATA:  June 24, 2004; PT 20.2, INR 2.2.  June 19, 2004; sodium 140, potassium 3.9, chloride 107, CO2 25, BUN 10, creatinine  0.9, glucose 95.  June 24, 2004; hemoglobin 14.8, hematocrit 44.9, white  blood cell count 11.7, platelets 358.  June 17, 2004; lupus anticoagulant,  detected.   RADIOLOGY:  June 16, 2004, chest x-ray; stable chronic obstructive pulmonary  disease pattern with minimal left lower lobe atelectasis and tiny effusion.   CT angiogram of chest, June 16, 2004; pulmonary embolism within the left  upper, left lower, and right lower lobes associated with mild left lower  lobe atelectasis, infarct with left tiny effusion.   BRIEF HISTORY:  A pleasant 60 year old female presented on June 15, 2004,  with chief complaint of substernal chest pain, which was sharp and pleuritic  in nature.  She underwent a CT scan of chest on June 16, 2004, which  demonstrated a pulmonary embolus in the left upper, left lower, and right  upper lobe with associated mild left lower lobe atelectasis and infarct.  Questionable emphysema.  Also had a DVT in the left lower extremity which  was occlusive in the mid to distal popliteal.  We were consulted on June 17, 2004.  The patient was placed on heparin drip.  We had been consulted  originally to evaluate the patient's dyspnea.   HOSPITAL COURSE:  Problem 1.  Deep venous thrombosis and pulmonary  embolism.  The patient was admitted to [facility] on June 15, 2004.  Diagnostic  evaluation by CAT scan verified pulmonary embolism, as well as ultrasound of  lower extremities on June 9, demonstrating left lower extremity deep venous  thrombosis.  The patient was placed on therapeutic heparin drip.  Heparin  levels were monitored by clinical pharmacy and heparin was titrated to  therapeutic range.  The patient was initiated on Coumadin.  Upon time of  discharge, the patient has maintained a therapeutic INR of 2.2 with goals  between 2 and 3 for over 72 hours.  The patient's dyspnea has resolved and  will be sent home on Coumadin to be monitored and dosed at the [facility]  C[facility]madin [facility] on [address].   Problem 2.  Moderate chronic obstructive pulmonary disease.  The patient was  evaluated by Dr. Shelle Iron earlier in May for the first time.  Pulmonary  function tests were evaluated by Dr. Shelle Iron.  Based on pulmonary function  tests, the patient was felt to have moderate chronic obstructive pulmonary  disease.  The patient was initiated on Spiriva for management of her chronic  obstructive pulmonary disease.  Spiriva will be her maintenance  bronchodilator at home.   DISPOSITION:  She has room air saturations between 92 and 95%, her heart  rate is in the 60's, her blood pressure is 121/52, she is afebrile, her  respirations are 18 to 20 breaths per minute.   PHYSICAL EXAMINATION:  GENERAL:  She is without jugular venous distention  and her breath sounds are clear, heart tones are normal.  She has moderate  trace edema which is equal in bilateral lower extremities, albumin soft.  She is tolerating her diet well.   She is ready for discharge with no acute complaints.  Her discharge  instructions were reviewed in detail with the patient and she is currently  ready for discharge.   DIET:  As previously described.  The patient was advised to avoid extreme  changes in  diet.   ACTIVITY:  Slowly increase activity as tolerated.   DISCHARGE MEDICATIONS:  1.  Warfarin 5 mg a day at dinner time, 5 to 6 p.m.  The patient will be      monitored at the [facility] and doses will be adjusted      accordingly to INR.  2.  Synthroid 100 mcg tablets daily.  3.  Mevacor 40 mg tablets daily at night.  4.  Atenolol 25 mg daily.  5.  Spiriva 18 mcg cap inhaled daily.  6.  Aspirin 81 mg daily.  7.  Niaspan 500 mg at night.   FOLLOW UP:  The patient will return to Coumadin Clinic on Monday, June 19,  at 12 noon on [address].  She also has follow-up on July 3,  at 12 noon with Marcelyn Bruins, M.D. [facility].  She has been advised to stop  smoking and to call if needed.      Gerda Diss   PB/MEDQ  D:  06/24/2004  T:  06/24/2004  Job:  [id number]   cc:   Learta Codding, M.D. [facility]

## 2010-05-27 NOTE — Letter (Signed)
May 03, 2006     RE:  [name], [name]  MRN:  [id number]  /  DOB:  31-May-1950   To Whom It May Concern:   [name] is a 60 year old patient of mine. The patient has a  history of stable coronary artery disease. She is status post stent  placement of the right coronary artery in 2003. She has normal left  ventricular systolic function as assessed by an echocardiographic study  in 2006 with an ejection fraction of 55% to 65%. The patient from a  clinical stand point has been stable with no exertional chest pain or  dyspnea. She underwent an exercise treadmill and was able to achieve  greater than 6 METS and obtain a heart rate of greater than 85% of  maximum predicted. Essentially her stress test was within normal limits.  She was able to achieve stage 3 of the Bruce protocol.   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 She receives annual medical  examinations. She reports no rest angina or change in angina pattern  within the last 3 months of her most recent examination. As outlined  above her stress test was within normal limits and we plan on going  forward with stress test every 2 years. She is also asymptomatic from a  cardiovascular perspective and has demonstrated good tolerance to her  cardiovascular related medications.   The patient will continue to follow up in our office on a regular basis.  If you have any further question regarding this patient and her ability  to drive, please contact me at [phone] at the [facility] Cardiology  Office.    Sincerely,      Learta Codding, MD,FACC  Electronically Signed    GED/MedQ  DD: 05/03/2006  DT: 05/03/2006  Job #: [id number]

## 2010-05-27 NOTE — H&P (Signed)
NAMEKEIR, FOLAND NO.:  [id number]   MEDICAL RECORD NO.:  [id number]          PATIENT TYPE:  EMS   LOCATION:  MAJO                         FACILITY:  [facility]   PHYSICIAN:  Olga Millers, M.D. LHCDATE OF BIRTH:  13-Oct-1950   DATE OF ADMISSION:  06/15/2004  DATE OF DISCHARGE:                                HISTORY & PHYSICAL   HISTORY OF PRESENT ILLNESS:  [name] is a 60 year old patient of Dr.  Margarita Mail with past medical history of coronary artery disease, COPD,  hyperlipidemia, hypothyroidism, nephrolithiasis who presents with chest  pain. The patient does have a history of coronary disease and has had prior  PCI of her LAD. Her last catheterization was on May 24, 2001. At that time  she was found to have an ejection fraction of 69% with normal wall motion  and no mitral regurgitation. There was 30% left main. There was no  obstructive disease in the left system. There was a 99% focal in-stent  restenosis of the ostial right coronary artery lesion. She subsequent had  PCI as well as brachytherapy to the right coronary artery. She apparently  had a nuclear study in April 2006 at our office but I do not have those  records available. She states that it was unremarkable. The patient presents  with complaints of chest pain. This occurred over Memorial Day weekend and  lasted 1-2 days. She had recurrent chest pain this morning. It is  substernal in location and described as a sharp pain. It does not radiate.  It does increase with inspiration but it does not change with position and  is not related to food. It is not exertional. She came to the emergency room  and is presently pain free. Of note, there is no associated nausea or  vomiting, shortness of breath, or diaphoresis. She does not recall whether  this pain is similar to her previous symptoms prior to her PCI. Because of  her symptoms, we were asked to further evaluate.   She has no known drug  allergies. Her medications include:  1.  Atenolol 25 mg p.o. daily.  2.  Mevacor 40 mg p.o. q.h.s.  3.  Synthroid 100 mcg p.o. daily.  4.  Aspirin one p.o. daily.   SOCIAL HISTORY:  She has a long history of tobacco use. She rarely consumes  alcohol.   FAMILY HISTORY:  Positive for coronary artery disease.   PAST MEDICAL HISTORY:  There is no diabetes mellitus or hypertension but  there is hyperlipidemia. She does have hypothyroidism. She was seen by Dr.  Shelle Iron recently and diagnosed with probable COPD and scheduled for follow-up  tomorrow. She has a history of coronary artery disease as outlined in the  HPI. She has a history of nephrolithiasis. Her only surgeries are from  childbirth.   REVIEW OF SYSTEMS:  She denies any headaches or fevers or chills. There is  no productive cough or hemoptysis. There is no dysphagia, odynophagia,  melena, or hematochezia. There is dysuria or hematuria. There is no rash or  seizure activity. There is no orthopnea, PND, or pedal  edema. She does  complain of numbness in her upper extremities bilaterally. There are no  other focal neurological symptoms. The remaining systems are negative.   PHYSICAL EXAMINATION TODAY:  VITAL SIGNS:  Her blood pressure is 116/59 and  a pulse of 73.  GENERAL:  She is well-developed and well-nourished in no acute distress. Her  skin is warm and dry. She does not appear to be depressed and there is no  peripheral clubbing.  HEENT:  Unremarkable with normal eyelids.  NECK:  Supple with a normal upstroke bilaterally and there are no bruits  noted. There is no jugular venous distention and I cannot appreciate  thyromegaly.  CHEST:  Shows diminished breath sounds throughout. There is a mild  expiratory wheeze. Her expansion is normal.  CARDIOVASCULAR:  Reveals a regular rate and rhythm with normal S1 and S2. I  cannot appreciate murmurs, rubs, or gallops and her PMI is difficult to  palpate.  ABDOMEN:  Not tender or  distended, positive bowel sounds. No  hepatosplenomegaly, no mass appreciated. There is no abdominal bruit. She  has 2+ femoral pulses bilaterally and no bruits.  EXTREMITIES:  Show no edema and I can palpate no cords. She has 2+ posterior  tibial pulses bilaterally.  NEUROLOGIC:  Grossly intact.   Her electrocardiogram shows normal sinus rhythm at a rate of 75. The axis is  normal. There is subtle inferolateral ST elevation which is unchanged from  previous. Her initial enzymes are negative. Her hemoglobin is 16.7. Her  potassium is 4.2.   DIAGNOSES:  1.  Atypical chest pain.  2.  History of coronary artery disease.  3.  Probable chronic obstructive pulmonary disease.  4.  Hyperlipidemia.  5.  Hypothyroidism.  6.  History of nephrolithiasis.   PLAN:  [name] presents with chest pain that is somewhat atypical. Her  electrocardiogram is unchanged from previous. We will admit and rule out  myocardial infarction as well as repeat her electrocardiogram in the  morning. I will also check a D-dimer as her pain is pleuritic. We will need  records from the office concerning her Myoview in April 2006. If her enzymes  are negative, her D-dimer is negative, her ECG is unchanged, and her Myoview  showed no ischemia then I think we should continue with medical therapy. She  could then follow up with Dr. Andee Lineman for further evaluation. She will follow  up with Dr. Shelle Iron tomorrow as scheduled if she is able to be discharged. We  will continue with her aspirin, Mevacor, and atenolol. She needs to  discontinue her tobacco use.      BC/MEDQ  D:  06/15/2004  T:  06/15/2004  Job:  [id number]

## 2010-05-27 NOTE — Cardiovascular Report (Signed)
[place]. [facility]  Patient:    LEEANNE, [name] Visit Number: [id number] MRN: [id number]          Service Type: MED Location: [phone] Attending Physician:  Ronaldo Miyamoto Dictated by:   Noralyn Pick Eden Emms, M.D. [facility] Proc. Date: 12/25/00 Admit Date:  12/24/2000   CC:         Lewayne Bunting, M.D. [facility]   Cardiac Catheterization  PROCEDURE:  Coronary arteriography.  INDICATIONS:  Syncope, question subendocardial myocardial infarction.  DESCRIPTION OF PROCEDURE:  Standard catheterization was performed from the right femoral artery.  RESULTS:  The patient had right dominant circulation.  The right coronary artery was somewhat difficult to engage.  The left main coronary artery was normal.  The proximal circumflex had a 30% discrete lesion.  The mid circumflex had a 20% discrete lesion.  There was a large ramus branch with a 30% ostial lesion.  The left anterior descending artery was a medium vessel and was normal.  The first diagonal branch had 30% multiple discrete lesions.  The right coronary artery consistently damped with any catheter.  We were able to get fairly selective injections using no-tow catheter.  There appeared to be a very calcified ostial stenosis, which was hypolucent.  Distally, there was a 30-40% discrete lesion.  RAO VENTRICULOGRAPHY:  RAO ventriculography was normal.  Ejection fraction was 70%.  There was no gradient across the aortic valve and no mitral regurgitation.  LV pressure was 119/22.  Aortic pressure was 119/72.  IMPRESSIONS:  I will have Dr. Juanda Chance look at the cine images.  I think that rotablator therapy of the ostial right coronary artery may be in order.  This appears to be a fixed calcific lesion and now catheter-tip spasm. Dictated by:   Noralyn Pick Eden Emms, M.D. [facility] Attending Physician:  Ronaldo Miyamoto DD:  12/25/00 TD:  12/25/00 Job: [id number]

## 2010-05-27 NOTE — Discharge Summary (Signed)
[place]  Patient:    [name], [name] Visit Number: [id number] MRN: [id number]          Service Type: MED Location: [phone] Attending Physician:  Ronaldo Miyamoto Dictated by:   Rozell Searing, P.A. Admit Date:  12/24/2000 Disc. Date: 12/27/00   CC:         Vikki Ports, M.D.                  Referring Physician Discharge [place]  PROCEDURE:  Coronary angiogram/stent, 12/25/00.  REASON FOR ADMISSION:  Ms. Port is a 60 year old female, who works as a Freight forwarder, with no prior history of heart disease, and cardiac risk factors notable for tobacco smoking, who initially presented to [facility] ER with chest pain worrisome for unstable anginal pectoris.  Of note, the patient had just returned from a drive from [place]. On initial inquiry, also revealed that the patient reportedly passed out (x 3).  Following stabilization, she was transferred to [facility] for further evaluation.  LABORATORY DATA:  WBC 6.4, hemoglobin 13.2, hematocrit 37.2, platelets 227 at discharge.  Sodium 138, potassium 3.7, glucose 101, BUN 10, creatinine 0.9 at discharge.  Cardiac enzymes:  Peak CPK 125/8.1; peak troponin I 0.60.  BNP 6.8.  Lipid profile:  Total cholesterol 237, triglycerides 127, HDL 39, LDL 173, cholesterol/HDL ratio 6.1.  C-reactive protein 0.3.  HOSPITAL COURSE:  Following transfer from [facility], the patient reveals positive serial cardiac enzymes suggestive of non-Q-wave myocardial infarction.  She was also evaluated for possible pulmonary embolus in light of her syncopal episodes.  A D-dimer was 1.0 but a spiral CT of the chest was negative for pulmonary embolus.  Her syncopal episodes were felt to be ischemic-mediated.  Coronary angiogram, performed by Dr. Eden Emms (see cath report for full details), revealed severe single-vessel CAD with a tight ostial RCA lesion and normal LV function.   The patient underwent subsequent successful stenting, by Dr. Juanda Chance of the 90% ostial RCA lesion to less than 10% residual stenosis. No noted complications.  The patient was cleared for discharge two days later in hemodynamically stable condition.  Final recommendations by Dr. 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  Following her stress test, the patient will then follow up with Dr. Andee Lineman.  DISCHARGE MEDICATIONS: 1. Plavix 75 mg q.d. 2. Zocor 20 mg q.h.s. 3. Foltx 1 tablet q.d. 4. Pravachol 20 mg q.h.s 5. Aspirin 81 mg q.d. 6. Wellbutrin 150 mg b.i.d. 7. Bisoprolol 2.5 mg q.d. 8. Albuterol/Atrovent MDI as directed. 9. Nitrostat as directed.  INSTRUCTIONS:  The patient is to refrain from any heavy lifting, strenuous activity, or resumption of long distance truck driving until cleared by her physician; maintain low fat/cholesterol diet.  The patient is scheduled for an exercise Cardiolite stress test at the [facility] on Tuesday, January 22, 2001, at 8:30 a.m.  She will then follow up with Dr. Lewayne Bunting on February 01, 2001, at 10:15 a.m.  DISCHARGE DIAGNOSES: 1. Non-Q-wave myocardial infarction/severe single-vessel coronary artery    disease.    a. Status post stent 90% ostial right coronary artery 12/17.    b. Normal left ventricle. 2. Status post syncope.    a. Probably ischemic-mediated. 3. Tobacco. 4. Dyslipidemia. Dictated by:   Rozell Searing, P.A. Attending Physician:  Ronaldo Miyamoto DD:  12/27/00 TD:  12/27/00 Job: [id number]

## 2010-05-27 NOTE — Discharge Summary (Signed)
NAMEFAY, BAGG                          ACCOUNT NO.:  [id number]   MEDICAL RECORD NO.:  [id number]                   PATIENT TYPE:  INP   LOCATION:  0343                                 FACILITY:  [facility]   PHYSICIAN:  Leonia Reeves, MD                 DATE OF BIRTH:  1950/06/19   DATE OF ADMISSION:  02/06/2003  DATE OF DISCHARGE:  02/09/2003                                 DISCHARGE SUMMARY   DISCHARGE DIAGNOSES:  1. Chronic obstructive pulmonary disease.  2. Asthmatic bronchitis.  3. Left bibasilar atelectasis/pneumonia.  4. Tobacco abuse.  5. History of hypothyroidism.   DISCHARGE MEDICATIONS:  1. Levaquin 500 mg p.o. once daily, 10 tablets.  2. Prednisone 20 mg to decrease by 2 mg daily and taper off.  3. Synthroid 0.175 mcg once daily.  4. Lovastatin 20 mg once daily.  5. Atenolol 25 mg once daily.  6. Aspirin 81 mg once daily.  7. Combivent inhaler two puffs four times daily as needed.   SUMMARY:  [name] is a 60 year old white female with a history of  coronary artery disease, status post stent placement.  Also status post PTCA  to RCA for end-stent restenosis and tobacco abuse.  She was sent from her  primary care physician's office, Dr. Theresia Lo, for direct admission  following her complaint of shortness of breath, cough with sputum  production, and pleuritic chest pain.   PHYSICAL EXAMINATION:  VITAL SIGNS:  Temperature 98 degrees, pulse 105,  blood pressure 122/60, respirations 22.  GENERAL APPEARANCE:  The patient was in mild distress secondary to pleuritic  chest pain.  LUNGS:  A few scattered crackles and mild wheezing throughout the lung  fields.  CARDIAC:  Tachycardia with a heart rate of 105.  No rub and no murmur  appreciated.  ABDOMEN:  Soft and nontender.  No palpable mass.  EXTREMITIES:  There was no edema.   The rest of the physical was not remarkable.   LABORATORY DATA:  Sodium 140, potassium 3.6, chloride 106, CO2 29, BUN 32,  creatinine 1.1, glucose 117, calcium 8.3, troponin 0.02.  White count 6.6,  hemoglobin 15.9, hematocrit 47.1, platelets 293.  Chest x-ray in the primary  care physician's office showed no evidence of pneumonia, but suggested  bronchitis at that time.  CT of the chest after the patient had been  rehydrated showed the following:  Changes of COPD throughout both lungs,  left bibasilar linear atelectasis or scar formation, a small amount of  pericardial fluids, and mild diffuse fatty infiltration of liver.   HOSPITAL COURSE:  The following medical issues were identified and  addressed:   #1 - ACUTE BRONCHITIS AND CHRONIC OBSTRUCTIVE PULMONARY DISEASE IN A SETTING  OF CHRONIC TOBACCO ABUSE:  The patient was started on IV Solu-Medrol which  was later switched to p.o. prednisone.  She was started on IV antibiotics  with Rocephin and p.o. Zithromax.  She was also started on nebulization  treatment with albuterol and continued on Combivent inhaler as needed.   #2 - ATELECTASIS AND PROBABLE PNEUMONIA:  The patient benefited from  incentive spirometry and also IV antibiotics.   #3 - ACUTE PLEURITIC CHEST PAIN:  The patient was started on IV Demerol for  pain and IV Phenergan for nausea and vomiting.  She was also given p.o.  Tylenol as needed for pain.   #4 - TOBACCO ABUSE:  I took time to counsel the patient regarding tobacco  cessation.  She seemed to understand that continued tobacco abuse would  worsen her medical problems, especially COPD, and also predispose her to  other medical problems, including lung cancer.  She told me that she was  willing to quit and requested, which I agree, a nicotine patch.   #5 - HISTORY OF CORONARY ARTERY DISEASE:  One of cardiac enzymes was done  which was negative for acute myocardial infarction.   Other medical issues included a history of hypothyroidism and hypertension.  The patient was continued on her home medications.  DVT prophylaxis was done  with  subcutaneous Lovenox.  Gastric ulcer prophylaxis was done with p.o.  Protonix.  The patient responded to the above regimen and continued to do  well.  COPD compensated, acute bronchitis resolved, and the patient's  overall medical condition remarkably improved.   DISPOSITION:  The patient is discharged home.   FOLLOWUP:  She has been instructed to follow up with her primary care  physician in 7-14 days of discharge.  She has been specifically instructed  to discontinue cigarette smoking.                                               Leonia Reeves, MD    VO/MEDQ  D:  02/09/2003  T:  02/09/2003  Job:  [id number]   cc:   Vikki Ports, M.D.  [address]  [place]  [place] [address]  Fax: [phone]

## 2010-05-27 NOTE — Assessment & Plan Note (Signed)
[facility]                          [facility] NOTE   NAME:[name], [name]                       MRN:          [id number]  DATE:12/11/2005                            DOB:          23-Dec-1950    REFERRING PHYSICIAN:  Barbette Hair. Artist Pais, DO   HISTORY OF PRESENT ILLNESS:  The patient is a 60 year old female with  coronary artery disease status post stent placement in the right  coronary artery in 2003. The patient also had brachytherapy for  restenosis. The patient is a long Museum/gallery curator. She  stated that over the last several weeks she has developed increased  shortness of breath in the last week. She actually developed dyspnea at  rest and fever and chills. A couple of days ago, she was in [place],  [place] and drove herself back over the last several days from  [place]. She stated that she had to stop with her truck on multiple  occasions and had to sleep in the cabin. She reported significant fevers  and chills. She also complains of generalized body aches. She denies  however any substernal chest pain. She finds it difficult to take a deep  breath in but she has no frank pleuritic chest pain. Today in the  office, the patient does appear to be dyspneic and is mildly cyanotic.  She denies any orthopnea or PND. She denies any palpitations or syncope.  Her INR was checked today and was 2.2.   MEDICATIONS:  1. Coumadin as directed.  2. Synthroid 100 mcg p.o. daily.  3. Mevacor 40 mg a day.  4. Atenolol 25 mg a day.  5. Spiriva 80 mcg p.o. daily.  6. Aspirin 81 mg a day.  7. Niaspan 5 mg p.o. q.h.s.   PHYSICAL EXAMINATION:  VITAL SIGNS:  Blood pressure 128/74, heart rate  is 98 beats per minute, weight is 201 pounds.  NECK:  Reveals normal carotid upstroke, no carotid bruits.  LUNGS:  Diminished breath sounds bilaterally with few expiratory wheezes  and diminished breath sounds and crackles at the right base.  HEART:  Regular rate and rhythm, tachycardic, normal S1, S2. No murmurs,  rubs or gallops.  ABDOMEN:  Soft and nontender. No rebound or guarding.  EXTREMITIES:  No clubbing, cyanosis or edema.   PROBLEM LIST:  1. Dyspnea.      a.     Rule out pneumonia.      b.     Rule out congestive heart failure.  2. Coronary artery disease      a.     Status post stent to the right coronary artery in 2003.      b.     Brachytherapy for restenosis.      c.     Myoview April 2006 negative for ischemia.  3. Chronic obstructive pulmonary disease, moderate.  4. Obstructive sleep apnea followed by Dr. Shelle Iron.  5. Status post lower extremity venous thrombosis and pulmonary      embolism on Coumadin.      a.     Positive for lupus anticoagulant.  b.     Positive for  Factor V Leiden gene mutation.      c.     Positive family history of hypercoagulability (history of       DVT and stroke in her daughter as well as her sister).      d.     Prior history of lower extremity deep venous thrombosis.   PLAN:  1. I doubt the patient is in heart failure. EKG is unchanged and there      is no acute ischemic changes.  2. I suspect given her fever and chills and cough that the patient has      a possible pneumonia. She also had significant abnormal lung exam.      She appears to be slightly cyanotic on physical examination and I      have sent the patient for a chest x-ray and ABG as well as      laboratory work.  3. The patient will return to the clinic this afternoon and pending      the results a decision can be made regarding outpatient treatment      or inpatient treatment for the possibility of pneumonia.     Learta Codding, MD,FACC  Electronically Signed    GED/MedQ  DD: 12/11/2005  DT: 12/12/2005  Job #: [id number]   cc:   Barbette Hair. Artist Pais, DO

## 2010-05-27 NOTE — Cardiovascular Report (Signed)
[place]. [facility]  Patient:    [name], [name] Visit Number: [id number] MRN: [id number]          Service Type: CAT Location: [phone] Attending Physician:  Daisey Must Dictated by:   Daisey Must, M.D. [facility] Proc. Date: 05/28/01 Admit Date:  05/28/2001 Discharge Date: 05/29/2001   CC:         Vikki Ports, M.D.  Lewayne Bunting, M.D. [facility]  Cardiac Catheterization Lab   Cardiac Catheterization  PROCEDURE:  Intracoronary brachytherapy of the ostium of the right coronary artery.  CARDIOLOGIST:  Daisey Must, M.D. [facility]  INDICATION:  [name] is a 60 year old woman with a history of prior stent to the ostium of the right coronary artery.  She presented to the hospital four days ago with unstable angina including symptoms of chest pain at rest. Cardiac catheterization at that time revealed a 99% in-stent restenosis in the ostium of the right coronary artery.  Because of her unstable anginal symptoms, we proceeded with percutaneous intervention at that time.  Cutting balloon angioplasty was performed, reducing the lesion to a residual less than 20% stenosis.  At that time, brachytherapy was not available.  The patient was, therefore, discharged home and brought back today for elective brachytherapy for the treated ostium of the right coronary artery to prevent recurrent in-stent restenosis.  PROCEDURE NOTE:  A 7-French sheath was placed in the left femoral artery. Heparin and Integrilin were administered per protocol.  We used a 7-French hockey stick left guiding catheter with side holes and a BMW wire.  Initial angiographic images revealed continued patency of the ostium of the right coronary artery with proximal 30% residual stenosis due to vessel recoil.  We initially advanced a BMW wire into the distal vessel.  We then dilated the lesion with a 3.5 x 12 mm Quantum balloon inflated to 18 atmospheres in order to reduce the amount of  recoil as well as to reenter the vessel.  Following this, intracoronary brachytherapy was administered.  We utilized a 3.5 x 32 mm delivery catheter with 113 second dwell time with a radioactive source.  Following this, the radiation delivery catheter and guidewire were removed.  Final angiographic images revealed patency of the right coronary artery with less than 25% residual stenosis and TIMI-3 flow.  COMPLICATIONS:  None.  RESULTS:  Successful intracoronary brachytherapy of the ostium of the right coronary artery as described.  There was residual less than 25% stenosis.  PLAN:  Integrilin will be continued for 12 hours.  Plavix will need to be continued for approximately six months.  Aspirin should be continued indefinitely. Dictated by:   Daisey Must, M.D. [facility] Attending Physician:  Daisey Must DD:  05/28/01 TD:  05/30/01 Job: [id number]

## 2010-05-27 NOTE — H&P (Signed)
[name], [name]                          ACCOUNT NO.:  [id number]   MEDICAL RECORD NO.:  [id number]                   PATIENT TYPE:  INP   LOCATION:  0343                                 FACILITY:  [facility]   PHYSICIAN:  Leonia Reeves, MD                 DATE OF BIRTH:  1950-10-07   DATE OF ADMISSION:  02/06/2003  DATE OF DISCHARGE:                                HISTORY & PHYSICAL   PRIMARY CARE PHYSICIAN:  Dr. Theresia Lo.   ADMISSION DIAGNOSES AND PLAN:  1. Acute pleuritic chest pain, probably secondary to pneumonia.     A. Intravenous Demerol and oral Tylenol as needed for pain control.     B. Intravenous Phenergan p.r.n. nausea and vomiting.  2. Acute bronchitis.     A. Intravenous antibiotics.     B. Albuterol nebulizer treatment as needed.  3. Probable pneumonia.     A. Repeat chest x-ray after patient is rehydrated.     B. Intravenous antibiotics with Rocephin and oral antibiotics with        Zithromax.     C. Sputum culture as needed.  4. Generalized weakness and clinical dehydration.     A. Rehydrate with D-5 half-normal saline plus potassium chloride.     B. Monitor BMP.  5. History of coronary artery disease, status post stent and status post     percutaneous transluminal coronary angioplasty.     A. Obtain serial cardiac enzymes.     B. Continue beta blocker.  6. History of dyslipidemia.     A. Lipid profile.     B. Continue lovastatin.   CHIEF COMPLAINT:  Chest pain, cough, shortness of breath, generalized  weakness.   HISTORY OF PRESENT ILLNESS:  [name] is a 60 year old white female with  a history of coronary artery disease, status post stent placement, status  post PTCA for end-stent restenosis, tobacco abuse, who was sent from her  primary care physician's office (Dr. Aurelio Brash office) for direct admission  following her complaint of shortness of breath, cough productive of scanty  yet clear sputum, and pleuritic chest pain. The patient said her  problem  started over the past two days, but has progressively become worse. She  denies fever, chills, nausea, vomiting, or diarrhea. She also denies  abdominal pain. She admits to generalized weakness and general headache.  Chest x-ray, which was done in the primary care physician's office did not  show evidence of pneumonia. There was a suggestion picture of bronchitis.   PAST MEDICAL HISTORY:  1. Coronary artery disease, status post stent placement and status post PTCA     for restenosis.  2. Dyslipidemia.  3. Hypertension.  4. Tobacco abuse.   The patient denies history of diabetes mellitus, CVA, and also denies  history of COPD.   SOCIAL HISTORY:  The patient lives with her family. She is a nonsmoker and  smokes one packet a  day for over 30 years.   ALLERGIES:  No known drug allergies.   CURRENT MEDICATIONS:  1. Synthroid 0.175 mcg once daily.  2. Lovastatin 20 mg once daily.  3. Atenolol 25 mg once daily.  4. Aspirin one tablet once daily.   REVIEW OF SYSTEMS:  CONSTITUTIONAL: General weakness. No fever.  CARDIOPULMONARY: Pleuritic chest pain, dyspnea, cough, sputum production.  GI: No nausea or vomiting. No abdominal pain.  EXTREMITIES: No leg edema.   PHYSICAL EXAMINATION:  GENERAL: On examination, a middle-age white female,  well built, well nourished, in mild distress secondary to pleuritic chest  pain and headache.  VITAL SIGNS: Blood pressure 122/63, pulse 105, temperature 98, respiratory  rate 22.  HEENT:  Head is normocephalic and atraumatic. Pupils are equal, round, and  reactive to light and accommodation.  Mucous membranes are dry consistent  with dehydration. Sclerae anicteric. No evidence of oropharyngeal lesions.  NECK: Supple without adenopathy. No jugular venous distention.  No carotid  bruits appreciated.  CARDIOVASCULAR: Tachycardia and a heart rate of 105. Normal S1 and S2. No S3  or gallop. No murmur or rub appreciated.  RESPIRATORY/LUNGS: A few  scattered crackles. No wheezing appreciated.  GI: The abdomen is soft, nontender, no palpable mass. Bowel sounds are  normal.  EXTREMITIES: No edema, no cyanosis, no ___________, and no calf tenderness.  NEUROLOGIC: No focal neurological deficits. Cranial nerves II-XII grossly  intact.   LABORATORY DATA:  Sodium 140, potassium 3.6, chloride 106, CO2 29, BUN 22,  creatinine 1.1, glucose 117, calcium 8.3. White count 6.6, hemoglobin 15.9,  hematocrit 47.1, platelet count 293,000. The first set of cardiac enzymes  revealed troponin of 0.02. EKG and UA pending.   ASSESSMENT:  A middle-age white female with history of coronary artery  disease and tobacco abuse who presents with pleuritic chest pain, cough with  sputum production associated with shortness of breath. Chest x-ray from  primary care physician's office has not shown any evidence for pneumonia.  The patient will be admitted to the medical floor and managed as stated  above. Also, with history of coronary artery disease we will need to get  cardiac enzymes as a part of workup and also monitor the electrocardiogram.  A chest x-ray will be repeated after patient has been well rehydrated. The  patient's home medications will be added as needed. Deep venous thrombosis  prophylaxis will be done with  subcutaneous  Lovenox and gastric ulcer  prophylaxis will be done with p.o. Protonix. I have taken time to consult  the patient regarding tobacco cessation. She seems to understand that  continued use of tobacco will  worsen her respiratory problems and even predispose her to other medical  problems including lung cancer and chronic obstructive pulmonary disease.  The patient would benefit from nicotine patch. The rationale for this  admission will be discussed with the patient and she is agreeable.                                               Leonia Reeves, MD   VO/MEDQ  D:  02/06/2003  T:  02/06/2003  Job:  [id number]

## 2010-06-22 ENCOUNTER — Inpatient Hospital Stay (HOSPITAL_COMMUNITY)
Admission: EM | Admit: 2010-06-22 | Discharge: 2010-06-27 | DRG: 248 | Disposition: A | Discharge status: Home / Self Care | Payer: Medicare Other | Attending: Cardiovascular Disease | Admitting: Cardiovascular Disease

## 2010-06-22 ENCOUNTER — Ambulatory Visit (INDEPENDENT_AMBULATORY_CARE_PROVIDER_SITE_OTHER): Payer: Medicare Other | Admitting: *Deleted

## 2010-06-22 ENCOUNTER — Inpatient Hospital Stay (HOSPITAL_COMMUNITY): Payer: Medicare Other

## 2010-06-22 DIAGNOSIS — Z9119 Patient's noncompliance with other medical treatment and regimen: Secondary | ICD-10-CM

## 2010-06-22 DIAGNOSIS — D6851 Activated protein C resistance: Secondary | ICD-10-CM

## 2010-06-22 DIAGNOSIS — J4489 Other specified chronic obstructive pulmonary disease: Secondary | ICD-10-CM | POA: Diagnosis present

## 2010-06-22 DIAGNOSIS — J449 Chronic obstructive pulmonary disease, unspecified: Secondary | ICD-10-CM | POA: Diagnosis present

## 2010-06-22 DIAGNOSIS — I2510 Atherosclerotic heart disease of native coronary artery without angina pectoris: Secondary | ICD-10-CM

## 2010-06-22 DIAGNOSIS — Z9861 Coronary angioplasty status: Secondary | ICD-10-CM

## 2010-06-22 DIAGNOSIS — I2699 Other pulmonary embolism without acute cor pulmonale: Secondary | ICD-10-CM

## 2010-06-22 DIAGNOSIS — I469 Cardiac arrest, cause unspecified: Secondary | ICD-10-CM | POA: Diagnosis present

## 2010-06-22 DIAGNOSIS — Z86718 Personal history of other venous thrombosis and embolism: Secondary | ICD-10-CM

## 2010-06-22 DIAGNOSIS — E039 Hypothyroidism, unspecified: Secondary | ICD-10-CM | POA: Diagnosis present

## 2010-06-22 DIAGNOSIS — E876 Hypokalemia: Secondary | ICD-10-CM | POA: Diagnosis present

## 2010-06-22 DIAGNOSIS — T82897A Other specified complication of cardiac prosthetic devices, implants and grafts, initial encounter: Secondary | ICD-10-CM | POA: Diagnosis present

## 2010-06-22 DIAGNOSIS — F17200 Nicotine dependence, unspecified, uncomplicated: Secondary | ICD-10-CM | POA: Diagnosis present

## 2010-06-22 DIAGNOSIS — D6859 Other primary thrombophilia: Secondary | ICD-10-CM | POA: Diagnosis present

## 2010-06-22 DIAGNOSIS — E785 Hyperlipidemia, unspecified: Secondary | ICD-10-CM | POA: Diagnosis present

## 2010-06-22 DIAGNOSIS — J9600 Acute respiratory failure, unspecified whether with hypoxia or hypercapnia: Secondary | ICD-10-CM | POA: Diagnosis present

## 2010-06-22 DIAGNOSIS — I2119 ST elevation (STEMI) myocardial infarction involving other coronary artery of inferior wall: Principal | ICD-10-CM | POA: Diagnosis present

## 2010-06-22 DIAGNOSIS — Z7901 Long term (current) use of anticoagulants: Secondary | ICD-10-CM

## 2010-06-22 DIAGNOSIS — G4733 Obstructive sleep apnea (adult) (pediatric): Secondary | ICD-10-CM | POA: Diagnosis present

## 2010-06-22 DIAGNOSIS — Z91199 Patient's noncompliance with other medical treatment and regimen due to unspecified reason: Secondary | ICD-10-CM

## 2010-06-22 DIAGNOSIS — Y849 Medical procedure, unspecified as the cause of abnormal reaction of the patient, or of later complication, without mention of misadventure at the time of the procedure: Secondary | ICD-10-CM | POA: Diagnosis present

## 2010-06-22 DIAGNOSIS — I10 Essential (primary) hypertension: Secondary | ICD-10-CM | POA: Diagnosis present

## 2010-06-22 DIAGNOSIS — I252 Old myocardial infarction: Secondary | ICD-10-CM

## 2010-06-22 DIAGNOSIS — Z7982 Long term (current) use of aspirin: Secondary | ICD-10-CM

## 2010-06-22 DIAGNOSIS — IMO0002 Reserved for concepts with insufficient information to code with codable children: Secondary | ICD-10-CM

## 2010-06-22 DIAGNOSIS — R079 Chest pain, unspecified: Secondary | ICD-10-CM

## 2010-06-22 LAB — CARDIAC PANEL(CRET KIN+CKTOT+MB+TROPI)
Relative Index: 11.6 — ABNORMAL HIGH (ref 0.0–2.5)
Troponin I: 9.11 ng/mL (ref <0.30)

## 2010-06-22 LAB — COMPREHENSIVE METABOLIC PANEL
AST: 52 U/L — ABNORMAL HIGH (ref 0–37)
Albumin: 3.1 g/dL — ABNORMAL LOW (ref 3.5–5.2)
Chloride: 104 mEq/L (ref 96–112)
Creatinine, Ser: 0.74 mg/dL (ref 0.4–1.2)
Sodium: 136 mEq/L (ref 135–145)
Total Bilirubin: 0.5 mg/dL (ref 0.3–1.2)

## 2010-06-22 LAB — CBC
HCT: 44.9 % (ref 36.0–46.0)
MCH: 32.4 pg (ref 26.0–34.0)
MCHC: 34.5 g/dL (ref 30.0–36.0)
MCV: 93.9 fL (ref 78.0–100.0)
RDW: 14.3 % (ref 11.5–15.5)
WBC: 11 10*3/uL — ABNORMAL HIGH (ref 4.0–10.5)

## 2010-06-22 LAB — APTT: aPTT: >200 seconds (ref 24–37)

## 2010-06-22 LAB — LIPID PANEL
HDL: 40 mg/dL (ref >39)
LDL Cholesterol: 131 mg/dL — ABNORMAL HIGH (ref 0–99)
Triglycerides: 87 mg/dL (ref <150)
VLDL: 17 mg/dL (ref 0–40)

## 2010-06-22 LAB — PROTIME-INR
INR: 2.79 — ABNORMAL HIGH (ref 0.00–1.49)
Prothrombin Time: 29.5 seconds — ABNORMAL HIGH (ref 11.6–15.2)

## 2010-06-23 ENCOUNTER — Inpatient Hospital Stay (HOSPITAL_COMMUNITY): Payer: Medicare Other

## 2010-06-23 DIAGNOSIS — J449 Chronic obstructive pulmonary disease, unspecified: Secondary | ICD-10-CM

## 2010-06-23 DIAGNOSIS — J9600 Acute respiratory failure, unspecified whether with hypoxia or hypercapnia: Secondary | ICD-10-CM

## 2010-06-23 DIAGNOSIS — Z9911 Dependence on respirator [ventilator] status: Secondary | ICD-10-CM

## 2010-06-23 LAB — CBC
MCH: 31.9 pg (ref 26.0–34.0)
MCV: 94.1 fL (ref 78.0–100.0)
Platelets: 225 10*3/uL (ref 150–400)
RBC: 4.58 MIL/uL (ref 3.87–5.11)

## 2010-06-23 LAB — BLOOD GAS, ARTERIAL
Acid-base deficit: 0.3 mmol/L (ref 0.0–2.0)
Acid-base deficit: 1.4 mmol/L (ref 0.0–2.0)
Bicarbonate: 24.1 mEq/L — ABNORMAL HIGH (ref 20.0–24.0)
Drawn by: 347641
FIO2: 0.5 %
FIO2: 0.4 %
MECHVT: 500 mL
O2 Saturation: 97.8 %
RATE: 18 resp/min
TCO2: 25.4 mmol/L (ref 0–100)
pCO2 arterial: 41.1 mmHg (ref 35.0–45.0)
pO2, Arterial: 140 mmHg — ABNORMAL HIGH (ref 80.0–100.0)
pO2, Arterial: 93.9 mmHg (ref 80.0–100.0)

## 2010-06-23 LAB — POCT I-STAT, CHEM 8
Chloride: 102 mEq/L (ref 96–112)
HCT: 47 % — ABNORMAL HIGH (ref 36.0–46.0)
Potassium: 3.6 mEq/L (ref 3.5–5.1)
Sodium: 134 mEq/L — ABNORMAL LOW (ref 135–145)

## 2010-06-23 LAB — HEMOGLOBIN A1C: Mean Plasma Glucose: 117 mg/dL — ABNORMAL HIGH (ref <117)

## 2010-06-23 LAB — PROTIME-INR
INR: 1.23 (ref 0.00–1.49)
Prothrombin Time: 15.7 seconds — ABNORMAL HIGH (ref 11.6–15.2)

## 2010-06-23 LAB — BASIC METABOLIC PANEL
CO2: 24 mEq/L (ref 19–32)
Chloride: 107 mEq/L (ref 96–112)
Creatinine, Ser: 0.7 mg/dL (ref 0.4–1.2)
Sodium: 140 mEq/L (ref 135–145)

## 2010-06-23 LAB — POCT I-STAT 3, ART BLOOD GAS (G3+): O2 Saturation: 100 %

## 2010-06-23 LAB — APTT: aPTT: 28 seconds (ref 24–37)

## 2010-06-23 LAB — POCT ACTIVATED CLOTTING TIME: Activated Clotting Time: 334 seconds

## 2010-06-23 LAB — TSH: TSH: 2.824 u[IU]/mL (ref 0.350–4.500)

## 2010-06-23 LAB — TROPONIN I: Troponin I: 21.55 ng/mL (ref <0.30)

## 2010-06-24 DIAGNOSIS — G4710 Hypersomnia, unspecified: Secondary | ICD-10-CM

## 2010-06-24 DIAGNOSIS — G4730 Sleep apnea, unspecified: Secondary | ICD-10-CM

## 2010-06-24 DIAGNOSIS — I2119 ST elevation (STEMI) myocardial infarction involving other coronary artery of inferior wall: Secondary | ICD-10-CM

## 2010-06-24 LAB — CBC
MCH: 31.5 pg (ref 26.0–34.0)
MCHC: 32.9 g/dL (ref 30.0–36.0)
MCV: 95.6 fL (ref 78.0–100.0)
Platelets: 199 10*3/uL (ref 150–400)

## 2010-06-24 LAB — BASIC METABOLIC PANEL
BUN: 17 mg/dL (ref 6–23)
CO2: 26 mEq/L (ref 19–32)
GFR calc non Af Amer: >60 mL/min (ref >60)
Glucose, Bld: 113 mg/dL — ABNORMAL HIGH (ref 70–99)
Potassium: 3.8 mEq/L (ref 3.5–5.1)

## 2010-06-24 LAB — CARDIAC PANEL(CRET KIN+CKTOT+MB+TROPI)
CK, MB: 404.3 ng/mL (ref 0.3–4.0)
Troponin I: >25 ng/mL (ref <0.30)

## 2010-06-24 LAB — HEPARIN LEVEL (UNFRACTIONATED): Heparin Unfractionated: 0.36 IU/mL (ref 0.30–0.70)

## 2010-06-24 LAB — PROTIME-INR: Prothrombin Time: 19.6 seconds — ABNORMAL HIGH (ref 11.6–15.2)

## 2010-06-25 DIAGNOSIS — I059 Rheumatic mitral valve disease, unspecified: Secondary | ICD-10-CM

## 2010-06-25 LAB — CARDIAC PANEL(CRET KIN+CKTOT+MB+TROPI): Total CK: 471 U/L — ABNORMAL HIGH (ref 7–177)

## 2010-06-25 LAB — BASIC METABOLIC PANEL
BUN: 17 mg/dL (ref 6–23)
CO2: 25 mEq/L (ref 19–32)
Calcium: 8.1 mg/dL — ABNORMAL LOW (ref 8.4–10.5)
Glucose, Bld: 126 mg/dL — ABNORMAL HIGH (ref 70–99)
Potassium: 3.6 mEq/L (ref 3.5–5.1)
Sodium: 135 mEq/L (ref 135–145)

## 2010-06-25 LAB — CBC
Hemoglobin: 11.8 g/dL — ABNORMAL LOW (ref 12.0–15.0)
MCH: 30.9 pg (ref 26.0–34.0)
Platelets: 183 10*3/uL (ref 150–400)
RBC: 3.82 MIL/uL — ABNORMAL LOW (ref 3.87–5.11)

## 2010-06-25 LAB — PROTIME-INR: Prothrombin Time: 22 seconds — ABNORMAL HIGH (ref 11.6–15.2)

## 2010-06-25 LAB — HEPARIN LEVEL (UNFRACTIONATED): Heparin Unfractionated: 0.41 IU/mL (ref 0.30–0.70)

## 2010-06-26 LAB — CBC
Hemoglobin: 11.8 g/dL — ABNORMAL LOW (ref 12.0–15.0)
MCH: 30.7 pg (ref 26.0–34.0)
MCHC: 33.1 g/dL (ref 30.0–36.0)
MCV: 93 fL (ref 78.0–100.0)
RBC: 3.84 MIL/uL — ABNORMAL LOW (ref 3.87–5.11)

## 2010-06-26 LAB — BASIC METABOLIC PANEL
CO2: 30 mEq/L (ref 19–32)
Calcium: 8.6 mg/dL (ref 8.4–10.5)
Creatinine, Ser: 0.56 mg/dL (ref 0.50–1.10)
Glucose, Bld: 93 mg/dL (ref 70–99)

## 2010-06-26 LAB — PROTIME-INR: INR: 2.02 — ABNORMAL HIGH (ref 0.00–1.49)

## 2010-06-27 LAB — CBC
HCT: 37.1 % (ref 36.0–46.0)
Hemoglobin: 12.4 g/dL (ref 12.0–15.0)
RBC: 4.01 MIL/uL (ref 3.87–5.11)
RDW: 14.1 % (ref 11.5–15.5)
WBC: 6.3 10*3/uL (ref 4.0–10.5)

## 2010-06-27 LAB — BASIC METABOLIC PANEL
CO2: 29 mEq/L (ref 19–32)
Calcium: 8.6 mg/dL (ref 8.4–10.5)
Chloride: 105 mEq/L (ref 96–112)
Potassium: 4 mEq/L (ref 3.5–5.1)
Sodium: 140 mEq/L (ref 135–145)

## 2010-06-27 LAB — PROTIME-INR: INR: 1.99 — ABNORMAL HIGH (ref 0.00–1.49)

## 2010-07-04 ENCOUNTER — Ambulatory Visit (INDEPENDENT_AMBULATORY_CARE_PROVIDER_SITE_OTHER): Payer: Medicare Other | Admitting: *Deleted

## 2010-07-04 ENCOUNTER — Encounter: Payer: Self-pay | Admitting: Cardiology

## 2010-07-04 ENCOUNTER — Ambulatory Visit (INDEPENDENT_AMBULATORY_CARE_PROVIDER_SITE_OTHER): Payer: Medicare Other | Admitting: Cardiology

## 2010-07-04 VITALS — BP 113/66 | HR 71 | Ht 66.0 in | Wt 200.0 lb

## 2010-07-04 DIAGNOSIS — I2699 Other pulmonary embolism without acute cor pulmonale: Secondary | ICD-10-CM

## 2010-07-04 DIAGNOSIS — Z9119 Patient's noncompliance with other medical treatment and regimen: Secondary | ICD-10-CM

## 2010-07-04 DIAGNOSIS — D6859 Other primary thrombophilia: Secondary | ICD-10-CM

## 2010-07-04 DIAGNOSIS — R079 Chest pain, unspecified: Secondary | ICD-10-CM

## 2010-07-04 DIAGNOSIS — Z7901 Long term (current) use of anticoagulants: Secondary | ICD-10-CM

## 2010-07-04 DIAGNOSIS — F17200 Nicotine dependence, unspecified, uncomplicated: Secondary | ICD-10-CM

## 2010-07-04 DIAGNOSIS — D6851 Activated protein C resistance: Secondary | ICD-10-CM

## 2010-07-04 LAB — POCT INR: INR: 2.2

## 2010-07-04 NOTE — Progress Notes (Signed)
HPI [name] came in today to have her Coumadin checked. He complained to Dr. Abner Greenspan that she was having some left neck pain. She also just doesn't feel well.  She's had no angina she describes across her chest. She has not taken nitroglycerin.  She still smokes. She was admitted with an acute inferior [name] MI after running out of her Plavix one week prior. The right coronary was stented.  She's not had her lisinopril field. She promises me that she is taking dual antiplatelet therapy. She's also therapeutic on her Coumadin today with a non-R2 0.2.  We have had long talk about not smoking again today.  Echocardiogram today shows normal sinus rhythm with a right atrial enlargement an evolving ST segment changes in the inferior leads. Compared to her discharge EKG there is been no substantial change. No past medical history on file.  No past surgical history on file.  No family history on file.  History   Social History  . Marital Status: Divorced    Spouse Name: N/A    Number of Children: N/A  . Years of Education: N/A   Occupational History  . Not on file.   Social History Main Topics  . Smoking status: Current Everyday Smoker  . Smokeless tobacco: Never Used  . Alcohol Use: No  . Drug Use: No  . Sexually Active: Not on file   Other Topics Concern  . Not on file   Social History Narrative  . No narrative on file    Allergies  Allergen Reactions  . Minocycline Hcl     REACTION: Dizzy  . Prednisone     REACTION: feels like throat swelling  . Varenicline Tartrate     REACTION: Dizzy    Current Outpatient Prescriptions  Medication Sig Dispense Refill  . acetaminophen (TYLENOL) 500 MG tablet Take 500 mg by mouth every 6 (six) hours as needed.        [name] aspirin 81 MG tablet Take 81 mg by mouth daily.        . Cholecalciferol (VITAMIN D3) 2000 UNITS CHEW Chew 1 tablet by mouth daily.        . fish oil-omega-3 fatty acids 1000 MG capsule Take 2 g by mouth daily.         . Fluticasone-Salmeterol (ADVAIR DISKUS) 250-50 MCG/DOSE AEPB Inhale 1 puff into the lungs every 12 (twelve) hours.        . gabapentin (NEURONTIN) 300 MG capsule Take 300 mg by mouth 3 (three) times daily.        . Glucosamine-Chondroitin-Vit D3 1500-1200-800 MG-MG-UNIT PACK Take 1 capsule by mouth 2 (two) times daily.        [name] guaiFENesin 200 MG tablet Take 400 mg by mouth every 4 (four) hours as needed.        [name] imipramine (TOFRANIL) 25 MG tablet Take 25 mg by mouth at bedtime.        [name] levothyroxine (LEVOTHROID) 125 MCG tablet Take 125 mcg by mouth daily.        . metoprolol tartrate (LOPRESSOR) 25 MG tablet Take 25 mg by mouth 2 (two) times daily.        . nitroGLYCERIN (NITROSTAT) 0.4 MG SL tablet Place 0.4 mg under the tongue every 5 (five) minutes as needed.        . pantoprazole (PROTONIX) 40 MG tablet Take 40 mg by mouth daily.        . rosuvastatin (CRESTOR) 20 MG tablet Take 20 mg  by mouth daily.        . Ticagrelor (BRILINTA) 90 MG TABS tablet Take 90 mg by mouth daily.        [name] tiotropium (SPIRIVA) 18 MCG inhalation capsule Place 18 mcg into inhaler and inhale daily.        . traMADol (ULTRAM) 50 MG tablet Take 50 mg by mouth every 6 (six) hours as needed.        . warfarin (COUMADIN) 5 MG tablet Take by mouth as directed.          ROS Negative other than HPI.   PE .General Appearance: well developed, well nourished in no acute distress, obese, disheveled, looks much older than stated age HEENT: symmetrical face, PERRLA, good dentition  Neck: no JVD, thyromegaly, or adenopathy, trachea midline Chest: symmetric without deformity, no tenderness Cardiac: PMI non-displaced, RRR, normal S1, S2, no gallop or murmur, no rub Lung: clear to ausculation and percussion Vascular: all pulses full without bruits  Abdominal: nondistended, nontender, good bowel sounds, no HSM, no bruits Extremities: no cyanosis, clubbing or edema, no sign of DVT, no varicosities  Skin: normal color,  no rashes Neuro: alert and oriented x 3, non-focal Pysch: normal affect Filed Vitals:   07/04/10 1225  BP: 113/66  Pulse: 71  Height: 5\' 6"  (1.676 m)  Weight: 200 lb (90.719 kg)    EKG  Labs and Studies Reviewed.   Lab Results  Component Value Date   WBC 6.3 06/27/2010   HGB 12.4 06/27/2010   HCT 37.1 06/27/2010   MCV 92.5 06/27/2010   PLT 238 06/27/2010      Chemistry      Component Value Date/Time   NA 140 06/27/2010 0519   K 4.0 06/27/2010 0519   CL 105 06/27/2010 0519   CO2 29 06/27/2010 0519   BUN 13 06/27/2010 0519   CREATININE 0.64 06/27/2010 0519      Component Value Date/Time   CALCIUM 8.6 06/27/2010 0519   ALKPHOS 90 06/22/2010 1935   AST 52* 06/22/2010 1935   ALT 25 06/22/2010 1935   BILITOT 0.5 06/22/2010 1935       Lab Results  Component Value Date   CHOL 188 06/22/2010   CHOL  Value: 212        ATP III CLASSIFICATION:  <200     mg/dL   Desirable  161-096  mg/dL   Borderline High  >=045    mg/dL   High       * 4/0/9811   CHOL 223* 04/01/2008   Lab Results  Component Value Date   HDL 40 06/22/2010   HDL 33* 04/14/2009   HDL 32.50* 04/01/2008   Lab Results  Component Value Date   LDLCALC 131* 06/22/2010   LDLCALC  Value: 154        Total Cholesterol/HDL:CHD Risk Coronary Heart Disease Risk Table                     Men   Women  1/2 Average Risk   3.4   3.3  Average Risk       5.0   4.4  2 X Average Risk   9.6   7.1  3 X Average Risk  23.4   11.0        Use the calculated Patient Ratio above and the CHD Risk Table to determine the patient's CHD Risk.        ATP III CLASSIFICATION (LDL):  <100  mg/dL   Optimal  161-096  mg/dL   Near or Above                    Optimal  130-159  mg/dL   Borderline  045-409  mg/dL   High  >811     mg/dL   Very High* 09/09/4780   Lab Results  Component Value Date   TRIG 87 06/22/2010   TRIG 126 04/14/2009   TRIG 97.0 04/01/2008   Lab Results  Component Value Date   CHOLHDL 4.7 06/22/2010   CHOLHDL 6.4 04/14/2009   CHOLHDL 7 04/01/2008     Lab Results  Component Value Date   HGBA1C 5.7* 06/22/2010   Lab Results  Component Value Date   ALT 25 06/22/2010   AST 52* 06/22/2010   ALKPHOS 90 06/22/2010   BILITOT 0.5 06/22/2010   Lab Results  Component Value Date   TSH 2.824 06/23/2010

## 2010-07-04 NOTE — Assessment & Plan Note (Signed)
This is noncoronary chest discomfort. We have talked about using nitroglycerin for angina which he seems to understand. Noncompliance is a huge problem here. We have strongly urged her to quit smoking. We have asked her to her lisinopril prescription filled. She has appointment next week with [place] for close followup.

## 2010-07-04 NOTE — Patient Instructions (Signed)
Your physician recommends that you continue on your current medications as directed. Please refer to the Current Medication list given to you today.  Your physician discussed the hazards of tobacco use. Tobacco use cessation is recommended and techniques and options to help you quit were discussed.   Your physician recommends that you schedule a follow-up appointment in: 07-11-10 with Joni Reining, NP

## 2010-07-05 ENCOUNTER — Encounter: Payer: Medicare Other | Admitting: *Deleted

## 2010-07-06 NOTE — Cardiovascular Report (Signed)
NAMEEMORI, MUMME NO.:  [id number]  MEDICAL RECORD NO.:  [id number]  LOCATION:  2901                         FACILITY:  [facility]  PHYSICIAN:  Lorine Bears, MD     DATE OF BIRTH:  1950/06/16  DATE OF PROCEDURE:  06/22/2010 DATE OF DISCHARGE:                           CARDIAC CATHETERIZATION   PRIMARY CARDIOLOGIST:  Gerrit Friends. Dietrich Pates, MD, [facility]  INTERVENTIONAL CARDIOLOGIST:  Verne Carrow, MD  PROCEDURES PERFORMED: 1. Left heart catheterization. 2. Coronary angiography. 3. Left ventricular angiography. 4. Right coronary artery angioplasty with thrombectomy and bare metal     stent placement due to very late stent thrombosis. 5. Closure device placement. 6. 30 minutes of ICU care due to respiratory failure and hypotension.  INDICATIONS AND CLINICAL HISTORY:  This is a 60 year old female with known history of severe single-vessel coronary artery disease with multiple angioplasties on the right coronary artery.  Most recently was in April 2011 when she presented with myocardial infarction.  She was found to have 99% in-stent restenosis in a previously placed bare metal stent.  She underwent an angioplasty and a drug-eluting stent placement at that time without complications.  Her other history includes COPD, chronic anticoagulation due to previous DVT, and factor V Leiden.  The patient called EMS this evening due to severe chest pain.  By the time EMS arrived, the patient was in respiratory distress and was found to be hypotensive with a systolic blood pressure of 70.  Shortly after she went into asystole and required brief CPR for only couple of minutes without having to give her any drugs.  She was taken to the emergency room at [facility] where she was intubated.  Her ECG showed significant inferior ST elevation.  She was brought emergently to the cath lab for cardiac catheterization and possible coronary intervention. This was an  emergent procedure.  No family could be reached.  STUDY DETAILS:  The patient started vomiting before initiating the case. There was possible aspiration.  An OG tube was placed.  She was severely agitated on the vent in spite of giving fentanyl and Versed.  The right groin area was prepped in a sterile fashion.  It was anesthetized with 1% lidocaine.  A 6-French sheath was placed in the right femoral artery. I started with a JL-4 catheter for left system angiography.  I then used JR-4 guiding catheter for the interventional procedure.  A pigtail catheter was used at the end for left ventricular angiography.  All catheter exchanges were done over the wire.  At the end of the case, the catheter was removed and the site was closed with a Perclose device.  INTERVENTIONAL PROCEDURE NOTE:  The right coronary artery was found to be occluded shortly after the ostium due to stent thrombosis.  The patient was already given 300 mg of aspirin rectally.  She was started on IV bivalirudin with therapeutic ACT.  I tried to cross the lesion with an intubation wire, but did not cross.  I then used Prowater wire with some difficulty, but I was able to pass the lesion.  Angiography here showed an extensive amount of thrombus.  Thrombectomy catheter was used with a total  of four passes.  A large amount of thrombus was retrieved.  I then dilated the stent with a 3.0 x 15-mm balloon to 10 atmospheres and proximally to 12 atmospheres.  I then placed a 3.5 x 33 mm Multilink Zeta stent, which is a bare metal stent to cover the whole old stent and also to bring it back all the way to the ostium.  This was deployed to 16 atmospheres.  It was then postdilated with a 4.0 x 20-mm [place] Quantum balloon to 12 atmospheres distally and 16 atmospheres at the proximal and ostial segments.  Angiography showed excellent results with no distal embolization.  The guiding catheter and the wires were removed.  The site was closed  as mentioned above.  STUDY FINDINGS:  Hemodynamic Findings:  Left ventricular pressure is 159/12 with a left ventricular end-diastolic pressure of 19 mmHg. Aortic pressure is 153/90 with a mean pressure of 119 mmHg.  Left ventricular angiography:  This showed normal LV systolic function with an estimated ejection fraction of 55% with mild inferior wall hypokinesis.  CORONARY ANGIOGRAPHY:  Left main coronary artery:  The vessel is normal in size and free of significant disease.  Left anterior descending artery:  The vessel is normal in size with minor irregularities throughout, but no evidence of obstructive disease.  Left circumflex artery:  The vessel is normal in size, but nondominant. It has minor irregularities with no evidence of obstructive disease.  Right coronary artery:  The vessel is very large in size and dominant. A stent is noted in the ostial and proximal segment.  Angiography showed 100% occlusion shortly after the ostium due to large thrombus.  After treating the vessel, there was 0% residual stenosis with TIMI 3 flow and no evidence of significant disease in the mid and distal segments.  STUDY CONCLUSIONS: 1. Inferior ST-elevation myocardial infarction due to very late stent     thrombosis. 2. Asystolic cardiac arrest status post brief CPR and intubation. 3. Normal LV systolic function. 4. No significant disease in the left system. 5. Successful angioplasty with thrombectomy and bare-metal stent     placement to ostial and proximal right coronary artery.  RECOMMENDATIONS: 1. Dual-antiplatelet therapy is recommended for at least 1 month.  The     patient should continue on warfarin for her DVT.  We will consider     Hematology consultation to see if long-term anticoagulation is     recommended.  If not, then the patient will likely benefit from     lifelong dual-antiplatelet therapy. 2. ICU and vent management care.  The patient possibly aspirated due     to  extensive vomiting. 3. I debated whether to send the patient for one-vessel coronary     artery bypass graft surgery after opening the vessel due to     recurrent problems with this same artery.  However, after     thrombectomy and balloon inflation,     there was significant improvement in the appearance that would     likely lead to early graft failure due to antegrade competitive     flow.  I think the patient will likely get restenosis in this stent     in the near future.  If that happens, the patient should probably     be stent to have one-vessel coronary artery bypass graft surgery.     Lorine Bears, MD     MA/MEDQ  D:  06/22/2010  T:  06/23/2010  Job:  [id number]  cc:   Gerrit Friends. Dietrich Pates, MD, [facility] Verne Carrow, MD  Electronically Signed by Lorine Bears MD on 07/06/2010 02:42:34 PM

## 2010-07-06 NOTE — H&P (Signed)
[name], [name] NO.:  [id number]  MEDICAL RECORD NO.:  [id number]  LOCATION:  2901                         FACILITY:  [facility]  PHYSICIAN:  Lorine Bears, MD     DATE OF BIRTH:  15-Dec-1950  DATE OF ADMISSION:  06/22/2010 DATE OF DISCHARGE:                             HISTORY & PHYSICAL   PRIMARY CARDIOLOGIST:  Gerrit Friends. Dietrich Pates, MD, [facility]  CHIEF COMPLAINT:  Chest pain and unresponsiveness.  HISTORY OF PRESENT ILLNESS:  This is a 60 year old female with known history of coronary artery disease status post multiple angioplasties and stent placement on the right coronary artery.  Most recently was in April 2011 where she was found to have inferior ST elevation at that time.  Cardiac catheterization showed severe 99% stenosis in the right coronary artery in a previously placed stent.  She underwent an angioplasty and drug-eluting stent placement at that time.  She also has history of recurrent DVTs and previous pulmonary embolism due to factor V Leiden and has been on long-term anticoagulation.  It seems that she has been on aspirin and warfarin and not on dual-antiplatelet therapy anymore.  She called EMS this evening due to severe chest pain.  By the time EMS got there, the patient was found to be in significant distress with hypotension and systolic blood pressure of 70.  She went into asystole and required CPR only briefly without requirement of any drugs. ECG showed severe inferior ST elevation.  She was brought to [facility] Emergency [facility] at [facility] where she was intubated.  She was given aspirin per rectum and was transferred to the cardiac catheterization lab.  In the cardiac catheterization lab, the patient had recurrent vomiting and possibly aspirated.  An OG-tube was placed. She underwent emergent cardiac catheterization.  PAST MEDICAL HISTORY: 1. Coronary artery disease as outlined above. 2. History of DVT and pulmonary embolism secondary  to factor V Leiden     gene mutation on chronic Coumadin therapy. 3. Tobacco use. 4. COPD. 5. Hyperlipidemia. 6. Obstructive sleep apnea.  HOME MEDICATIONS: 1. Warfarin 5 mg as directed. 2. Niaspan 500 mg at bedtime. 3. Fish oil 1200 mg once daily. 4. Toprol-XL 50 mg once daily. 5. Crestor 20 mg once daily. 6. Aspirin 325 mg once daily, but not sure if the patient was taking     this. 7. Vitamin D once daily. 8. Gabapentin 300 mg t.i.d. 9. Glucosamine/chondroitin once daily. 10.Fiber therapy once daily. 11.Tylenol as needed. 12.Imipramine 25 mg at bedtime. 13.Imdur 120 mg once daily. 14.Omeprazole 20 mg once daily. 15.Tramadol 50 mg every 8 hours as needed. 16.Prednisone 10 mg once daily.  ALLERGIES:  No known drug allergies.  SOCIAL HISTORY:  Remarkable for ongoing smoking.  Occasional alcohol, but no recreational drug use.  FAMILY HISTORY:  Remarkable for thrombosis, but no reported premature coronary artery disease.  REVIEW OF SYSTEMS:  Unable to obtain at this time as the patient is intubated and sedated.  PHYSICAL EXAMINATION:  GENERAL:  The patient had significant agitation dictation while intubated.  The patient is intubated and sedated. VITAL SIGNS:  Blood pressure fluctuated but in the cath lab ultimately it was 150/70 with a  heart rate in the 90s.  Oxygen saturation was 100% on 100% of O2. HEENT:  Normocephalic, atraumatic. NECK:  No JVD or carotid bruits. RESPIRATORY:  Normal respiratory effort with no use of accessory muscles.  Auscultation reveals normal breath sounds. CARDIOVASCULAR:  Normal PMI.  Normal S1 and S2 with no gallops or murmurs. ABDOMEN:  Benign, nontender, and nondistended. EXTREMITIES:  With no clubbing, cyanosis, or edema. SKIN:  Warm and dry with no rash. PSYCHIATRIC:  Unable to assess.  LABORATORY AND DIAGNOSTIC DATA:  ECG showed sinus rhythm with significant inferior ST elevation.  IMPRESSION: 1. Inferior ST-elevation  myocardial infarction due to very late stent     thrombosis. 2. Respiratory failure due to cardiac arrest, which required brief     CPR. 3. Possible aspiration due to vomiting. 4. Hypertension. 5. Chronic obstructive pulmonary disease with ongoing tobacco use. 6. Hyperlipidemia. 7. Hypothyroidism, unclear if taking treatment. 8. 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  RECOMMENDATIONS:  The patient was found to have very late stent thrombosis and underwent thrombectomy and bare metal stent placement to the ostial right coronary artery.  This was a difficult case overall due to large thrombus burden.  I debated whether to stabilize the vessel and send the patient for one-vessel coronary artery bypass graft surgery in a few days.  However, there was good antegrade flow and reasonable results with balloon angioplasty and thrombectomy to the point where I felt that the patient will likely have early graft failure due to competitive flow.  Thus, I think the long-term plan would be to reevaluate her stent within 6 months.  Most likely she will restenose in that area and if that happens, then one vessel coronary artery bypass graft surgery would probably be the best management option especially in the setting of her continued smoking and also the need for long-term anticoagulation, which makes dual-antiplatelet therapy difficult in this situation.  In the acute setting now I am worried that the patient vomit and possibly she did not get a full antiplatelet effect from aspirin and Plavix.  Due to that, I will go ahead and start her on Integrilin for a total of 12 hours.  We will stop Angiomax.  The patient will need likely anticoagulation, but we will hold off on that until Integrilin infusion is finished to decrease bleeding complications.  We will start the patient on small dose of metoprolol and continue with  statin.  Smoking cessation will need to be addressed with the patient.  I consulted Pulmonary for vent management.     Lorine Bears, MD     MA/MEDQ  D:  06/22/2010  T:  06/23/2010  Job:  [id number]  cc:   Gerrit Friends. Dietrich Pates, MD, [facility]  Electronically Signed by Lorine Bears MD on 07/06/2010 02:42:52 PM

## 2010-07-08 ENCOUNTER — Encounter: Payer: Self-pay | Admitting: Cardiology

## 2010-07-11 ENCOUNTER — Ambulatory Visit (INDEPENDENT_AMBULATORY_CARE_PROVIDER_SITE_OTHER): Payer: Medicare Other | Admitting: Adult Health

## 2010-07-11 ENCOUNTER — Ambulatory Visit (INDEPENDENT_AMBULATORY_CARE_PROVIDER_SITE_OTHER): Payer: Medicare Other | Admitting: *Deleted

## 2010-07-11 ENCOUNTER — Encounter: Payer: Self-pay | Admitting: Adult Health

## 2010-07-11 DIAGNOSIS — D6859 Other primary thrombophilia: Secondary | ICD-10-CM

## 2010-07-11 DIAGNOSIS — E7800 Pure hypercholesterolemia, unspecified: Secondary | ICD-10-CM

## 2010-07-11 DIAGNOSIS — I2510 Atherosclerotic heart disease of native coronary artery without angina pectoris: Secondary | ICD-10-CM

## 2010-07-11 DIAGNOSIS — F17200 Nicotine dependence, unspecified, uncomplicated: Secondary | ICD-10-CM

## 2010-07-11 DIAGNOSIS — Z7901 Long term (current) use of anticoagulants: Secondary | ICD-10-CM

## 2010-07-11 DIAGNOSIS — D6851 Activated protein C resistance: Secondary | ICD-10-CM

## 2010-07-11 DIAGNOSIS — I2699 Other pulmonary embolism without acute cor pulmonale: Secondary | ICD-10-CM

## 2010-07-11 DIAGNOSIS — I25810 Atherosclerosis of coronary artery bypass graft(s) without angina pectoris: Secondary | ICD-10-CM

## 2010-07-11 LAB — POCT INR: INR: 3

## 2010-07-11 NOTE — Progress Notes (Signed)
HPI: [name] is a 60 y/o patient of Dr. Andee Lineman in [place] office we are seeing on hospital follow-up after STEMI inferiorly, with total occlusion with large thrombus within the previously placed RCA stent.  The RCA was stented with a Zeta stent. She had repeat catheterization two days later secondary to acute pain and found to have acute stent thrombosis with distal embolus into the PDA. This was treated with balloon angioplasty and aspiration thrombectomy of both sites. She also had VDRF at that time.  Because of recurrent thrombosis, plavix was discontinued and Brilinta was loaded.  She was continued also on coumadin.  She was seen by Dr. Daleen Squibb one week ago.  He was concerned about compliance issues. He advised her to quit smoking.  She is here for follow-up.  She has not taken the lisinopril as directed, has not had time to go to [facility].  She continues complaint with other medications.  She will have her PT-INR completed while here in the office. Allergies  Allergen Reactions  . Minocycline Hcl     REACTION: Dizzy  . Prednisone     REACTION: feels like throat swelling  . Varenicline Tartrate     REACTION: Dizzy    Current Outpatient Prescriptions  Medication Sig Dispense Refill  . acetaminophen (TYLENOL) 500 MG tablet Take 500 mg by mouth every 6 (six) hours as needed.        [name] aspirin 81 MG tablet Take 81 mg by mouth daily.        . Cholecalciferol (VITAMIN D3) 2000 UNITS CHEW Chew 1 tablet by mouth daily.        . fish oil-omega-3 fatty acids 1000 MG capsule Take 2 g by mouth daily.        . Fluticasone-Salmeterol (ADVAIR DISKUS) 250-50 MCG/DOSE AEPB Inhale 1 puff into the lungs every 12 (twelve) hours.        . gabapentin (NEURONTIN) 300 MG capsule Take 300 mg by mouth 3 (three) times daily.        . Glucosamine-Chondroitin-Vit D3 1500-1200-800 MG-MG-UNIT PACK Take 1 capsule by mouth 2 (two) times daily.        [name] guaiFENesin 200 MG tablet Take 400 mg by mouth every 4 (four) hours as  needed.        [name] imipramine (TOFRANIL) 25 MG tablet Take 25 mg by mouth at bedtime.        [name] levothyroxine (LEVOTHROID) 125 MCG tablet Take 125 mcg by mouth daily.        . metoprolol tartrate (LOPRESSOR) 25 MG tablet Take 25 mg by mouth 2 (two) times daily.        . nitroGLYCERIN (NITROSTAT) 0.4 MG SL tablet Place 0.4 mg under the tongue every 5 (five) minutes as needed.        . pantoprazole (PROTONIX) 40 MG tablet Take 40 mg by mouth daily.        . rosuvastatin (CRESTOR) 20 MG tablet Take 20 mg by mouth daily.        . Ticagrelor (BRILINTA) 90 MG TABS tablet Take 90 mg by mouth daily.        [name] tiotropium (SPIRIVA) 18 MCG inhalation capsule Place 18 mcg into inhaler and inhale daily.        . traMADol (ULTRAM) 50 MG tablet Take 50 mg by mouth every 6 (six) hours as needed.        . warfarin (COUMADIN) 5 MG tablet Take by mouth as directed.  Past Medical History  Diagnosis Date  . CAD April 2011    Inferior STEMI with multiple angioplasties and stent placement  to RCA.  [name] DVT (deep venous thrombosis)   . Pulmonary embolism   . Factor V Leiden, prothrombin gene mutation   . Hyperlipidemia   . COPD (chronic obstructive pulmonary disease)   . OSA (obstructive sleep apnea)   . Tobacco abuse   . Hypothyroidism     Past Surgical History  Procedure Date  . Cardiac catheterization   . Coronary angioplasty     ZOX:WRUEAV of systems complete and found to be negative unless listed above PHYSICAL EXAM BP 99/62  Pulse 79  Ht 5\' 6"  (1.676 m)  Wt 204 lb (92.534 kg)  BMI 32.93 kg/m2  SpO2 95% General: Well developed, well nourished, in no acute distress Head: Eyes PERRLA, No xanthomas.   Normal cephalic and atramatic  Lungs: Clear bilaterally to auscultation and percussion. Heart: HRRR S1 S2,  Pulses are 2+ & equal.            No carotid bruit. No JVD.  No abdominal bruits. No femoral bruits. Abdomen: Bowel sounds are positive, abdomen soft and non-tender without masses or                   Hernia's noted. Msk:  Back normal, normal gait. Normal strength and tone for age. Extremities: No clubbing, cyanosis or edema.  DP +1 Multiple bruises on arms and legs. Neuro: Alert and oriented X 3. Psych:  Good affect, responds appropriately   ASSESSMENT AND PLAN

## 2010-07-11 NOTE — Patient Instructions (Addendum)
Your physician recommends that you schedule a follow-up appointment in: 3 months with Dr. Daleen Squibb Your physician recommends that you return for lab work in: today Stool cards-follow instructions in packey

## 2010-07-11 NOTE — Assessment & Plan Note (Signed)
She is basically without complaint with the exception of being dizzy when she gets up from a sitting or lying position to a standing position.  She has to wait before she moves forward. Orthostatics are negative in clinic.  INR was 3.6 however. Coumadin is being held. I will check a CBC for evaluation of anemia and provide her with hemoccult cards.  Otherwise she will hold the lisinopril and we will follow-up in 3 months. Should there be abnormalities in the lab results we will follow-up sooner.

## 2010-07-11 NOTE — Assessment & Plan Note (Signed)
She has smoked 2-3 cigarettes since she has been out to the hospital.  She is trying to quit.  I have encouraged her to do so.

## 2010-07-12 LAB — CBC WITH DIFFERENTIAL/PLATELET
Basophils Absolute: 0.1 10*3/uL (ref 0.0–0.1)
Basophils Relative: 1 % (ref 0–1)
Hemoglobin: 15.2 g/dL — ABNORMAL HIGH (ref 12.0–15.0)
MCHC: 33 g/dL (ref 30.0–36.0)
Monocytes Relative: 7 % (ref 3–12)
Neutro Abs: 4.3 10*3/uL (ref 1.7–7.7)
Neutrophils Relative %: 64 % (ref 43–77)

## 2010-07-14 ENCOUNTER — Encounter: Payer: Self-pay | Admitting: *Deleted

## 2010-07-19 ENCOUNTER — Telehealth: Payer: Self-pay | Admitting: *Deleted

## 2010-07-19 NOTE — Telephone Encounter (Signed)
Patient recently had MI & Lisinopril added.  States she has been feeling dizzy off & on since then.  Did discuss this at eph visit in [place] with Joni Reining, NP.  Orthostatics in office negative per her dictation.  Advised pt to stay properly hydrated with G2 & keep bp log in the meantime.  If continue to trend low & stay symptomatic, contact office.  Keep log for next couple weeks then bring to office.  Patient verbalized understanding.

## 2010-07-22 ENCOUNTER — Other Ambulatory Visit: Payer: Self-pay | Admitting: *Deleted

## 2010-07-22 MED ORDER — QUINAPRIL HCL 5 MG PO TABS: ORAL_TABLET | ORAL | Status: DC

## 2010-07-22 MED ORDER — METOPROLOL SUCCINATE ER 50 MG PO TB24: 50.0000 mg | ORAL_TABLET | Freq: Every day | ORAL | Status: DC

## 2010-07-28 NOTE — Discharge Summary (Signed)
[name], CAUDILL NO.:  [id number]  MEDICAL RECORD NO.:  [id number]  LOCATION:  3714                         FACILITY:  [facility]  PHYSICIAN:  Veverly Fells. Excell Seltzer, MD  DATE OF BIRTH:  1950/04/14  DATE OF ADMISSION:  06/22/2010 DATE OF DISCHARGE:  06/27/2010                              DISCHARGE SUMMARY   PRIMARY CARDIOLOGIST:  Learta Codding, MD, [facility].  PRIMARY CARE PROVIDER:  [facility].  PULMONOLOGIST:  Barbaraann Share, MD, [facility]  DISCHARGE DIAGNOSIS:  Acute inferior ST-segment elevation myocardial infarction.  SECONDARY DIAGNOSES: 1. Coronary artery disease status post prior right coronary artery     stenting with multiple repeat intervention secondary to in-stent     restenosis and thrombosis. 2. Factor V Leiden, on chronic Coumadin. 3. History of deep venous thrombosis and pulmonary embolism. 4. Ongoing ventilator-dependent respiratory failure. 5. Ongoing tobacco abuse. 6. Chronic obstructive pulmonary disease. 7. Hyperlipidemia. 8. Obstructive sleep apnea. 9. Hypothyroidism. 10.Medication nonadherence.  ALLERGIES:  SIMVASTATIN causing cracking in the legs and feet.  PROCEDURES: 1. Emergent diagnostic cardiac catheterization showing total occlusion     with large thrombus within the previously placed right coronary     artery stent.  The left coronary tree had minor irregularities and     ejection fraction was 55% mild inferior wall hypokinesis.  The     right coronary artery was stented with a 3.5 x 33-mm Zeta stent. 2. Repeat percutaneous intervention on the afternoon of June 23, 2010,     secondary to recurrent pain and acute stent thrombosis with distal     embolus.  Stent thrombosis was treated with balloon angioplasty and     distal embolus into the posterior descending artery and right     posterolateral was treated with aspiration thrombectomy in each     location. 3. A 2-D echocardiogram on June 25, 2010, ejection fraction  50-55%     with moderate to severe basal and mid inferior as well as posterior     hypokinesis.  Grade 2 diastolic dysfunction.  Mild mitral     regurgitation.  Normal RV systolic function.  HISTORY OF PRESENT ILLNESS:  This is a 60 year old female with prior history of coronary artery disease status post prior proximal right coronary artery stenting and complicated by recurrent restenosis requiring repeat intervention.  Unfortunately, the patient ran out of Plavix approximately 1 week prior to this admission.  On the evening of admission, the patient developed severe chest pain and called EMS.  By the time EMS arrived, the patient was found to be in significant distress and hypotensive with systolic pressure of 70.  She subsequently became asystolic and required CPR prior to return of spontaneous circulation.  ECG showed inferior ST-segment elevation, and the patient was taken to the [facility] ED.  She remained in respiratory distress and was intubated and code STEMI was called.  HOSPITAL COURSE:  The patient was taken to [facility] for emergent catheterization and intervention.  Catheterization revealed total occlusion within the previously placed proximal right coronary artery stent with otherwise minor irregularities throughout the left coronary tree.  EF was 55% with mild inferior wall hypokinesis.  The right coronary artery was initially treated with aspiration thrombectomy followed by placement of 3.5 x 33-mm Zeta bare-metal stent.  Plavix was loaded via orogastric tube and aspirin was given rectally.  The patient remained intubated postprocedure and was taken to the coronary intensive care unit.  The patient's respiratory status improved and was extubated on the morning of June 23, 2010.  She unfortunately developed recurrent chest pain at approximately 11:40 in the morning and ECG showed persistent inferior ST-elevation but also slight ST elevation in V4 through V6  which was new.  The patient was taken back to the cath lab and repeat imaging revealed acute stent thrombosis within the proximal right coronary artery with distal embolus into the PDA and RPL.  The proximal area was treated with balloon angioplasty while the more distal areas were treated with aspiration thrombectomy with restoration of flow.  Despite lifelong Coumadin therapy in the setting of factor V Leiden with history of DVT and PE, secondary to recurrent thrombosis and restenosis, it was felt at this point that the patient had failed Plavix therapy and Brilinta was loaded.  The patient was monitored in the coronary intensive care unit and resumed on heparin and Coumadin following sheath pulls.  Integrilin was also on board for an additional 18 hours post catheterization.  [name] has had no recurrent chest discomfort.  She has been tolerating her medications well.  She did peak her CK following her second intervention at 3157, MB at 404.3, and troponin I at greater than 25.  The patient has been transferred out to the floor and seen by Cardiac Rehab with recommendation for outpatient rehab.  She has also been counseled on the importance of complete smoking cessation and medication compliance.  During this admission, in the setting of VDRF, Pulmonology has been on board.  There is a concern for history of sleep apnea, and the patient has been seen by Dr. Shelle Iron who plans to arrange an outpatient sleep study with CPAP initiation if necessary.  We have worked with case management to help the patient to obtain her medications.  We filled out paperwork for the Brilinta assistance program and I have also provided the patient with a card for 30 days free Brilinta.  She will be discharged home today in good condition.  DISCHARGE LABORATORY DATA:  Hemoglobin 12.4, hematocrit 37.1, WBC 6.3, platelets 238.  INR 1.99.  Sodium 140, potassium 4.1, chloride 105, CO2 is 29, BUN 13,  creatinine 0.64, glucose 88, total bilirubin 0.5, alkaline phosphatase 90, AST 52, ALT 25, total protein 6.0, albumin 3.1, calcium 8.6.  Hemoglobin A1c 5.7.  CK 471, MB 40.4, troponin I 13.43. Total cholesterol 188, triglycerides 87, HDL 40, LDL 131.  TSH 2.824. MRSA screen was negative.  DISPOSITION:  The patient will be discharged home today in good condition.  FOLLOWUP PLANS AND APPOINTMENTS:  Due to scheduling, we have to arrange to have Ms. Weseman follow up in our [place] [facility] with Joni Reining, nurse practitioner, on July 11, 2010, at 1:20 p.m.  She will follow up with Dr. Andee Lineman in our [facility] from there forward.  She will also follow up in our [facility] on July 05, 2010, at 1:50 p.m. for Coumadin Clinic.  DISCHARGE MEDICATIONS: 1. Advair 250/50 one puff b.i.d. 2. Lisinopril 2.5 mg daily. 3. Metoprolol 25 mg b.i.d. 4. Protonix 40 mg nightly. 5. Ticagrelor 90 mg b.i.d. 6. Spiriva 18 mcg inhaled daily. 7. Aspirin 81 mg daily. 8. Nitroglycerin 0.4 mg sublingual  p.r.n. chest pain. 9. Coumadin 5 mg 2 tablets daily until directed, otherwise. 10.Fish oil one cap daily. 11.Gabapentin 300 mg nightly. 12.Guaifenesin/dextromethorphan 1 tablet q.12 h. p.r.n. 13.Glucosamine chondroitin 1500/1200 mg 2 tablets daily. 14.Imipramine 25 mg nightly. 15.Levothyroxine 125 mcg daily. 16.Niaspan 500 mg nightly. 17.Psyllium seed husk 0.52 grams daily. 18.Rosuvastatin 20 mg daily. 19.Tramadol 50 mg q.6 h. p.r.n. 20.Tylenol Extra Strength 500 mg 2 tablets q.6 h. p.r.n. 21.Vitamin D3 one tablet, over the counter daily.  OUTSTANDING LABORATORY STUDIES:  Followup INR on July 05, 2010.  DURATION OF DISCHARGE ENCOUNTER:  60 minutes including physician time.     Nicolasa Ducking, ANP   ______________________________ Veverly Fells. Excell Seltzer, MD    CB/MEDQ  D:  06/27/2010  T:  06/27/2010  Job:  [id number]  cc:   [facility]  Electronically Signed by Nicolasa Ducking ANP on 07/01/2010 02:33:01 PM Electronically Signed by Tonny Bollman MD on 07/28/2010 12:19:39 AM

## 2010-07-28 NOTE — Cardiovascular Report (Signed)
[name], [name] NO.:  [id number]  MEDICAL RECORD NO.:  [id number]  LOCATION:  2901                         FACILITY:  [facility]  PHYSICIAN:  Veverly Fells. Excell Seltzer, MD  DATE OF BIRTH:  27-May-1950  DATE OF PROCEDURE:  06/23/2010 DATE OF DISCHARGE:                           CARDIAC CATHETERIZATION   PROCEDURE: 1. Selective coronary angiography. 2. PTCA, aspiration thrombectomy, and intravascular ultrasound of the     right coronary artery.  PROCEDURAL INDICATIONS:  Ms. Lehrmann is an unfortunate 60 year old woman with factor V Leiden, who has had previous stent placement in the right coronary artery.  She was last treated with a drug-eluting stent about 1 year ago by Dr. Clifton James.  She had done well until discontinuing Plavix recently.  She presented early this morning with an acute inferior wall infarction and was treated with primary PCI.  A long bare metal stent was placed in the proximal right coronary artery with an excellent result.  The patient now has three layers of stent in her proximal RCA.  She was intubated overnight and she extubated this morning.  About 1 hour after her Integrilin was discontinued, she developed recurrent chest pain.  There was residual ST-segment elevation, but her ST segments never resolved even initially after the angioplasty procedure.  With recurrent symptoms, she was evaluated by Dr. Clifton James and brought in emergently for reload catheterization and probable PCI.  Consent was obtained under emergency circumstances.  The right wrist was prepped and draped.  The area was anesthetized with 1% lidocaine.  Using modified Seldinger technique, a 6-French sheath was placed in the right radial artery.  A 5000 units of heparin was given intravenously, 3 mg of verapamil was administered through the sheath.  A 6-French JR-4 guide catheter was inserted and this demonstrated total occlusion of the right coronary artery at the ostium of the  vessel. There was no flow whatsoever into the RCA.  Integrilin was started.  A Cougar guidewire was advanced beyond the area of occlusion without difficulty.  The vessel was dilated with a 2.5 x 12 mm Trek balloon. Two inflations to 10 atmospheres were done.  This restored flow, but there was a total occlusion of both the PDA and posterolateral branches, likely secondary to embolization.  At that point, the wire was placed into the PDA beyond the area of total occlusion and aspiration thrombectomy was performed.  Multiple passes were made with an [facility] catheter.  Intracoronary nitroglycerin was given.  This restored TIMI 3 flow into both branch vessels.  There was no significant stenosis at either site, but there was diffuse disease noted.  The mid RCA also had diffuse narrowing that was not present at the prior calf earlier today.  I suspected this was due to vasospasm.  At this point, we had TIMI 3 flow with diffuse stenosis throughout the stent which was probably due to diffuse thrombus.  I elected to perform intravascular ultrasound to better evaluate the area.  The intravascular ultrasound catheter was advanced into the distal RCA because there was also an area of linear clot seen in the midvessel.  A mechanical pullback was performed and it demonstrated eccentric clot through the mid vessel  with diffuse concentric clot throughout the stented segment.  There was diffuse narrowing of the stent.  At that point, I did not think we really had good options other than re-dilating the stent.  I did not want to put another stent in the mid vessel as the patient has now had multiple stent thromboses.  A 4.5 x 20-mm [place] Quantum balloon was advanced into the distal portion of the stented segment and was inflated to 8 atmospheres.  It was taken to 14 atmospheres in the mid segment and 16 atmospheres hanging out of the ostium of the vessel.  The final result was excellent.  There was TIMI 3  flow throughout.  There is no significant residual stenosis in the stented segment.  There was a mild filling defect still visualized in the mid RCA and this was in the area where the eccentric clot was visualized.  There was TIMI 3 flow into both branch vessels.  The patient tolerated the procedure well and remained hemodynamically stable.  There were no immediate complications. A TR band was used for radial hemostasis.  FINAL CONCLUSIONS: 1. Total occlusion of the right coronary artery secondary to acute     stent thrombosis. 2. Successful PCI as described above, utilizing intravascular     ultrasound guidance with PTCA and aspiration thrombectomy.  RECOMMENDATIONS:  This is a very difficult situation.  This patient has factor V Leiden and history of DVT and pulmonary embolus.  She requires lifelong anticoagulation with warfarin.  She now will require lifelong dual antiplatelet therapy as well.  We have changed her from Plavix to Brilinta.  She will be treated with aspirin 81 mg, Brilinta 90 mg twice daily, and intravenous heparin will be started in 4 hours to bridge her to warfarin.     Veverly Fells. Excell Seltzer, MD     MDC/MEDQ  D:  06/23/2010  T:  06/24/2010  Job:  [id number]  cc:   Lorine Bears, MD  Electronically Signed by Tonny Bollman MD on 07/28/2010 12:19:34 AM

## 2010-08-01 ENCOUNTER — Encounter: Payer: Medicare Other | Admitting: *Deleted

## 2010-08-03 ENCOUNTER — Ambulatory Visit (INDEPENDENT_AMBULATORY_CARE_PROVIDER_SITE_OTHER): Payer: Medicare Other | Admitting: *Deleted

## 2010-08-03 DIAGNOSIS — D6859 Other primary thrombophilia: Secondary | ICD-10-CM

## 2010-08-03 DIAGNOSIS — I2699 Other pulmonary embolism without acute cor pulmonale: Secondary | ICD-10-CM

## 2010-08-03 DIAGNOSIS — Z7901 Long term (current) use of anticoagulants: Secondary | ICD-10-CM

## 2010-08-03 DIAGNOSIS — D6851 Activated protein C resistance: Secondary | ICD-10-CM

## 2010-08-22 ENCOUNTER — Ambulatory Visit (INDEPENDENT_AMBULATORY_CARE_PROVIDER_SITE_OTHER): Payer: Medicare Other | Admitting: *Deleted

## 2010-08-22 DIAGNOSIS — Z7901 Long term (current) use of anticoagulants: Secondary | ICD-10-CM

## 2010-08-22 DIAGNOSIS — I2699 Other pulmonary embolism without acute cor pulmonale: Secondary | ICD-10-CM

## 2010-08-22 DIAGNOSIS — D6851 Activated protein C resistance: Secondary | ICD-10-CM

## 2010-08-22 DIAGNOSIS — D6859 Other primary thrombophilia: Secondary | ICD-10-CM

## 2010-08-22 LAB — POCT INR: INR: 2.6

## 2010-09-14 ENCOUNTER — Ambulatory Visit (INDEPENDENT_AMBULATORY_CARE_PROVIDER_SITE_OTHER): Payer: Medicare Other | Admitting: *Deleted

## 2010-09-14 DIAGNOSIS — I2699 Other pulmonary embolism without acute cor pulmonale: Secondary | ICD-10-CM

## 2010-09-14 DIAGNOSIS — Z7901 Long term (current) use of anticoagulants: Secondary | ICD-10-CM

## 2010-09-14 DIAGNOSIS — D6851 Activated protein C resistance: Secondary | ICD-10-CM

## 2010-09-14 DIAGNOSIS — D6859 Other primary thrombophilia: Secondary | ICD-10-CM

## 2010-09-14 LAB — POCT INR: INR: 2.5

## 2010-10-03 LAB — PROTIME-INR
INR: 3.3 — ABNORMAL HIGH
Prothrombin Time: 35.4 — ABNORMAL HIGH

## 2010-10-12 ENCOUNTER — Ambulatory Visit (INDEPENDENT_AMBULATORY_CARE_PROVIDER_SITE_OTHER): Payer: Medicare Other | Admitting: *Deleted

## 2010-10-12 DIAGNOSIS — Z7901 Long term (current) use of anticoagulants: Secondary | ICD-10-CM

## 2010-10-12 DIAGNOSIS — D6851 Activated protein C resistance: Secondary | ICD-10-CM

## 2010-10-12 DIAGNOSIS — D6859 Other primary thrombophilia: Secondary | ICD-10-CM

## 2010-10-12 DIAGNOSIS — I2699 Other pulmonary embolism without acute cor pulmonale: Secondary | ICD-10-CM

## 2010-10-12 LAB — POCT INR: INR: 2.5

## 2010-10-17 ENCOUNTER — Ambulatory Visit (INDEPENDENT_AMBULATORY_CARE_PROVIDER_SITE_OTHER): Payer: Medicare Other | Admitting: Cardiology

## 2010-10-17 VITALS — BP 126/73 | HR 84 | Ht 66.0 in | Wt 201.8 lb

## 2010-10-17 DIAGNOSIS — D6859 Other primary thrombophilia: Secondary | ICD-10-CM

## 2010-10-17 DIAGNOSIS — I2699 Other pulmonary embolism without acute cor pulmonale: Secondary | ICD-10-CM

## 2010-10-17 DIAGNOSIS — D6851 Activated protein C resistance: Secondary | ICD-10-CM

## 2010-10-17 DIAGNOSIS — I2510 Atherosclerotic heart disease of native coronary artery without angina pectoris: Secondary | ICD-10-CM

## 2010-10-17 NOTE — Assessment & Plan Note (Signed)
Patient will need to remain on lifelong Coumadin therapy in conjunction with antiplatelet therapy. Bleeding was still need to be closely monitored

## 2010-10-17 NOTE — Patient Instructions (Signed)
Continue all current medications. Follow up in  6-8 weeks 

## 2010-10-17 NOTE — Progress Notes (Signed)
History of present illness:  The patient is a 60 year old female with a history of hypercoagulable state, secondary to factor V Leiden deficiency and prior history of coronary artery disease. The patient had multiple interventions to the right coronary artery secondary to in-stent restenosis and thrombosis. She presented in July with acute stent thrombosis of the previously placed RCA stent. This was a week after she had stopped taking Plavix. As a matter fact the patient ran out of Plavix. She presented with ST elevation myocardial infarction, cardiogenic shock requiring CPR and intubation with mechanical ventilation. She underwent a bare-metal stent placement to the right coronary artery and was reloaded with Plavix. After she was liberated from mechanical ventilation she developed recurrent substernal chest pain and underwent a repeat catheterization and was found to have acute stent from [facility] of the previously placed stent as well as distal embolus and to the PDA. She was treated with balloon angioplasty and aspiration thrombectomy of both sides. She was then placed on ticagrelor and continued on Coumadin. Fortunately her ejection fraction has remained rather stable with an ejection fraction of 50-55%. The patient presents for followup. She reports no recurrent chest pain. She is very limited in her mobility due to chronic back pain. 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 Of note was also that her PT/INR was down to 1.4 just prior to her in-stent thrombosis. She is currently stable and reports no recurrent chest pain or shortness of breath. She's compliant with her medical regimen.  Allergies, family history and social history: As documented in chart and reviewed. In-stent restenosis and thrombosis on Plavix/failure to Plavix therapy.  Medications: Documented and reviewed in chart.  Past medical  history: Reviewed and see problem list below   Review of systems: No shortness of breath orthopnea PND. No palpitations or syncope. No melena hematochezia. No dysuria or frequency. No fever or chills.   Physical examination : Vital signs documented below General: Well-nourished white female overweight walking with a cane but in no distress HEENT: EOMI, PERRLA, normal carotid upstroke bilaterally no carotid bruits. No thyromegaly nonnodular thyroid Lungs: Clear breath sounds bilaterally. No wheezing Heart: Regular rate and rhythm with normal S1-S2 and no murmur rubs or gallops Abdomen: Soft nontender with no rebound or guarding good bowel sounds Extremity exam: No cyanosis clubbing or edema Neurologic: Alert and oriented grossly nonfocal Psychiatric: Normal affect Vascular exam: Normal peripheral pulses

## 2010-10-17 NOTE — Assessment & Plan Note (Signed)
Patient is compliant with her medical therapy with ticagrelor and low-dose aspirin 81 mg a day. Again we clearly stated to her that she cannot run out of her medications. No clinical evidence of recurrent ischemia.

## 2010-10-17 NOTE — Assessment & Plan Note (Signed)
Continue on Coumadin therapy with prior history of multiple pulmonary emboli

## 2010-11-09 ENCOUNTER — Ambulatory Visit (INDEPENDENT_AMBULATORY_CARE_PROVIDER_SITE_OTHER): Payer: Medicare Other | Admitting: *Deleted

## 2010-11-09 DIAGNOSIS — D6859 Other primary thrombophilia: Secondary | ICD-10-CM

## 2010-11-09 DIAGNOSIS — I2699 Other pulmonary embolism without acute cor pulmonale: Secondary | ICD-10-CM

## 2010-11-09 DIAGNOSIS — D6851 Activated protein C resistance: Secondary | ICD-10-CM

## 2010-11-09 DIAGNOSIS — Z7901 Long term (current) use of anticoagulants: Secondary | ICD-10-CM

## 2010-11-09 LAB — POCT INR: INR: 1.8

## 2010-11-30 ENCOUNTER — Ambulatory Visit (INDEPENDENT_AMBULATORY_CARE_PROVIDER_SITE_OTHER): Payer: Medicare Other | Admitting: *Deleted

## 2010-11-30 DIAGNOSIS — Z7901 Long term (current) use of anticoagulants: Secondary | ICD-10-CM

## 2010-11-30 DIAGNOSIS — D6859 Other primary thrombophilia: Secondary | ICD-10-CM

## 2010-11-30 DIAGNOSIS — I2699 Other pulmonary embolism without acute cor pulmonale: Secondary | ICD-10-CM

## 2010-11-30 DIAGNOSIS — D6851 Activated protein C resistance: Secondary | ICD-10-CM

## 2010-11-30 LAB — POCT INR: INR: 1.7

## 2010-12-06 ENCOUNTER — Ambulatory Visit (INDEPENDENT_AMBULATORY_CARE_PROVIDER_SITE_OTHER): Payer: Medicare Other | Admitting: Cardiology

## 2010-12-06 ENCOUNTER — Encounter: Payer: Self-pay | Admitting: Cardiology

## 2010-12-06 VITALS — BP 106/67 | HR 86 | Ht 66.0 in | Wt 207.0 lb

## 2010-12-06 DIAGNOSIS — E785 Hyperlipidemia, unspecified: Secondary | ICD-10-CM

## 2010-12-06 DIAGNOSIS — I2699 Other pulmonary embolism without acute cor pulmonale: Secondary | ICD-10-CM

## 2010-12-06 DIAGNOSIS — I2510 Atherosclerotic heart disease of native coronary artery without angina pectoris: Secondary | ICD-10-CM

## 2010-12-06 NOTE — Progress Notes (Signed)
CC: Followup patient with severe coronary artery disease of the ostium of the right coronary artery requiring multiple stents over the years  HPI:  The patient is a 60 year old female with a history of hypercoagulable state, secondary to factor V Leiden deficiency and prior history of coronary artery disease. The patient had multiple interventions to the right coronary artery secondary to in-stent restenosis and thrombosis. She presented in July with acute stent thrombosis of the previously placed RCA stent. This was a week after she had stopped taking Plavix. As a matter fact the patient ran out of Plavix. She presented with ST elevation myocardial infarction, cardiogenic shock requiring CPR and intubation with mechanical ventilation. She underwent a bare-metal stent placement to the right coronary artery and was reloaded with Plavix. After she was liberated from mechanical ventilation she developed recurrent substernal chest pain and underwent a repeat catheterization and was found to have acute stent from [facility] of the previously placed stent as well as distal embolus and to the PDA. She was treated with balloon angioplasty and aspiration thrombectomy of both sides. She was then placed on ticagrelor and continued on Coumadin. Fortunately her ejection fraction has remained rather stable with an ejection fraction of 50-55%. The patient presents for followup. The patient unfortunately was unable to get ticagrelor and has minimal without medications. I told the patient that this is a serious problem. She cannot run out of medications particularly her antiplatelet agents. She is at very high risk for stent restenosis and in-stent thrombosis due to multiple layers of stenting of the ostium of the right coronary artery. The patient also knows that she was told by Dr. Kirke Corin that she will need a followup cardiac catheterization in the next couple of months. Fortunately she reports no chest pain although she does have  shortness of breath which is a chronic problem. She also has COPD. Her symptoms however have not worsened.      PMH: reviewed and listed in Problem List in Electronic Records (and see below) Past Medical History  Diagnosis Date  . CAD April 2011    Inferior STEMI with multiple angioplasties and stent placement  to RCA.  [name] DVT (deep venous thrombosis)   . Pulmonary embolism   . Factor V Leiden, prothrombin gene mutation   . Hyperlipidemia   . COPD (chronic obstructive pulmonary disease)   . OSA (obstructive sleep apnea)   . Tobacco abuse   . Hypothyroidism      Allergies/SH/FHX : available in Electronic Records for review  Medications: Current Outpatient Prescriptions  Medication Sig Dispense Refill  . acetaminophen (TYLENOL) 500 MG tablet Take 500 mg by mouth every 6 (six) hours as needed.        [name] aspirin 81 MG tablet Take 81 mg by mouth daily.        . Cholecalciferol (VITAMIN D3) 2000 UNITS CHEW Chew 1 tablet by mouth daily.        . fish oil-omega-3 fatty acids 1000 MG capsule Take 2 g by mouth daily.        . Fluticasone-Salmeterol (ADVAIR DISKUS) 250-50 MCG/DOSE AEPB Inhale 1 puff into the lungs every 12 (twelve) hours.        . gabapentin (NEURONTIN) 300 MG capsule Take 300 mg by mouth at bedtime.       . Glucosamine-Chondroitin-Vit D3 1500-1200-800 MG-MG-UNIT PACK Take 1 capsule by mouth 2 (two) times daily.        [name] guaiFENesin 200 MG tablet Take 400 mg by mouth  2 (two) times daily as needed.       [name] imipramine (TOFRANIL) 25 MG tablet Take 25 mg by mouth at bedtime.        [name] levothyroxine (LEVOTHROID) 125 MCG tablet Take 125 mcg by mouth daily.        [name] lisinopril (PRINIVIL,ZESTRIL) 5 MG tablet Take 2.5 mg by mouth daily.        . metoprolol tartrate (LOPRESSOR) 25 MG tablet Take 25 mg by mouth 2 (two) times daily.        . niacin (NIASPAN) 500 MG CR tablet Take 500 mg by mouth at bedtime.        . nitroGLYCERIN (NITROSTAT) 0.4 MG SL tablet Place 0.4 mg under the  tongue every 5 (five) minutes as needed.        . pantoprazole (PROTONIX) 40 MG tablet Take 40 mg by mouth daily.        . psyllium (REGULOID) 0.52 G capsule Take 0.52 g by mouth daily.        . rosuvastatin (CRESTOR) 20 MG tablet Take 20 mg by mouth daily.       [name] tiotropium (SPIRIVA) 18 MCG inhalation capsule Place 18 mcg into inhaler and inhale daily.        . traMADol (ULTRAM) 50 MG tablet Take 50 mg by mouth every 6 (six) hours as needed.        . warfarin (COUMADIN) 5 MG tablet Take by mouth as directed.        . Ticagrelor (BRILINTA) 90 MG TABS tablet Take 90 mg by mouth 2 (two) times daily.         ROS: No nausea or vomiting. No fever or chills.No melena or hematochezia.No bleeding.No claudication  Physical Exam: BP 106/67  Pulse 86  Ht 5\' 6"  (1.676 m)  Wt 207 lb (93.895 kg)  BMI 33.41 kg/m2 General: Obese white female in no distress Neck: Normal carotid upstroke. No carotid bruits. No thyromegaly nonnodular thyroid. JVP is 5-6 cm. Lungs: Diminished breath sounds but without wheezing Cardiac: Regular rate and rhythm with normal S1-S2 no murmur rubs or gallops Vascular: No edema. Normal dorsalis pedis and posterior tibial pulses Skin: Warm and dry  12lead ECG: Limited bedside ECHO:N/A   Assessment and Plan

## 2010-12-06 NOTE — Assessment & Plan Note (Signed)
The patient remains on high-dose statin therapy. Next visit we will monitor her lipid panel LFTs.

## 2010-12-06 NOTE — Assessment & Plan Note (Signed)
Patient ran out of her antiplatelet agents. I really limited her in the office today with 180 mg of ticagrelor. I also gave her samples for a month's supply. I was very firm to the patient and told her that she cannot run out of her medications, as quite frankly her life is dependent on this. She states that she will notify us in the next couple weeks if for some reason her mail order does not come through. The patient will also be scheduled for routine catheterization with Dr. Kirke Corin in January of 2013.

## 2010-12-06 NOTE — Patient Instructions (Signed)
   Left JV heart cath with Dr. Kirke Corin for January 2013 Follow up after above procedure

## 2010-12-06 NOTE — Assessment & Plan Note (Signed)
Patient remains on Coumadin for factor V Leiden deficiency

## 2010-12-19 ENCOUNTER — Ambulatory Visit (INDEPENDENT_AMBULATORY_CARE_PROVIDER_SITE_OTHER): Payer: Medicare Other | Admitting: *Deleted

## 2010-12-19 DIAGNOSIS — D6859 Other primary thrombophilia: Secondary | ICD-10-CM

## 2010-12-19 DIAGNOSIS — Z7901 Long term (current) use of anticoagulants: Secondary | ICD-10-CM

## 2010-12-19 DIAGNOSIS — D6851 Activated protein C resistance: Secondary | ICD-10-CM

## 2010-12-19 DIAGNOSIS — I2699 Other pulmonary embolism without acute cor pulmonale: Secondary | ICD-10-CM

## 2010-12-19 LAB — POCT INR: INR: 1.5

## 2010-12-28 ENCOUNTER — Ambulatory Visit (INDEPENDENT_AMBULATORY_CARE_PROVIDER_SITE_OTHER): Payer: Medicare Other | Admitting: *Deleted

## 2010-12-28 ENCOUNTER — Telehealth: Payer: Self-pay | Admitting: *Deleted

## 2010-12-28 ENCOUNTER — Other Ambulatory Visit: Payer: Self-pay | Admitting: *Deleted

## 2010-12-28 DIAGNOSIS — D6851 Activated protein C resistance: Secondary | ICD-10-CM

## 2010-12-28 DIAGNOSIS — D6859 Other primary thrombophilia: Secondary | ICD-10-CM

## 2010-12-28 DIAGNOSIS — Z7901 Long term (current) use of anticoagulants: Secondary | ICD-10-CM

## 2010-12-28 DIAGNOSIS — I2699 Other pulmonary embolism without acute cor pulmonale: Secondary | ICD-10-CM

## 2010-12-28 MED ORDER — LEVOTHYROXINE SODIUM 125 MCG PO TABS: 125.0000 ug | ORAL_TABLET | Freq: Every day | ORAL | Status: DC

## 2010-12-28 MED ORDER — NITROGLYCERIN 0.4 MG SL SUBL: 0.4000 mg | SUBLINGUAL_TABLET | SUBLINGUAL | Status: DC | PRN

## 2010-12-28 MED ORDER — METOPROLOL TARTRATE 25 MG PO TABS: 25.0000 mg | ORAL_TABLET | Freq: Two times a day (BID) | ORAL | Status: DC

## 2010-12-28 MED ORDER — LISINOPRIL 5 MG PO TABS: 2.5000 mg | ORAL_TABLET | Freq: Every day | ORAL | Status: DC

## 2010-12-28 MED ORDER — WARFARIN SODIUM 5 MG PO TABS: 5.0000 mg | ORAL_TABLET | ORAL | Status: DC

## 2010-12-28 NOTE — Telephone Encounter (Signed)
Patient now has Medicare (only hospital & office).  Does not have any prescription coverage.  Was initially getting assistance from the free clinic / med assist.  Can no longer use their services since getting Medicare.  She can not afford the Brilinta or Crestor.  Patient was given number for Music therapist for assitance with Brilinta.  She will call me back if she does not qualify for any help.  Can we change the Crestor to something generic on $4.00 list?

## 2010-12-28 NOTE — Telephone Encounter (Signed)
Crestor 10 can be  changed to pravastatin 80 mg by mouth each bedtime. Ultimately he may need to go back to generic Plavix. Let me know what comes out of the discussion with the drug company

## 2010-12-30 ENCOUNTER — Telehealth: Payer: Self-pay | Admitting: *Deleted

## 2010-12-30 NOTE — Telephone Encounter (Signed)
Called and informed patient that samples of brilinta are available. Patient informed to come by office to pick them up. Brilinta 90mg  #24 ZOX#[id number] exp-02/09/11.

## 2011-01-05 ENCOUNTER — Telehealth: Payer: Self-pay | Admitting: Pulmonary Disease

## 2011-01-05 NOTE — Telephone Encounter (Signed)
Left message to return call 

## 2011-01-05 NOTE — Telephone Encounter (Signed)
Received copies from [facility],on 12.27.12 . Forwarded 5 pages to Dr. Shelle Iron ,for review.  sj

## 2011-01-11 ENCOUNTER — Ambulatory Visit (INDEPENDENT_AMBULATORY_CARE_PROVIDER_SITE_OTHER): Payer: Medicare Other | Admitting: *Deleted

## 2011-01-11 DIAGNOSIS — D6859 Other primary thrombophilia: Secondary | ICD-10-CM | POA: Diagnosis not present

## 2011-01-11 DIAGNOSIS — D6851 Activated protein C resistance: Secondary | ICD-10-CM

## 2011-01-11 DIAGNOSIS — I2699 Other pulmonary embolism without acute cor pulmonale: Secondary | ICD-10-CM

## 2011-01-11 DIAGNOSIS — Z7901 Long term (current) use of anticoagulants: Secondary | ICD-10-CM

## 2011-01-16 NOTE — Telephone Encounter (Signed)
Left message to return call 

## 2011-01-17 NOTE — Telephone Encounter (Signed)
Left message to return call 

## 2011-01-18 MED ORDER — CLOPIDOGREL BISULFATE 75 MG PO TABS: 75.0000 mg | ORAL_TABLET | Freq: Every day | ORAL | Status: DC

## 2011-01-18 NOTE — Telephone Encounter (Signed)
Discussed below with patient.  Will send rx for Plavix 75mg  daily to [facility].  States she has called Music therapist for their assistance with Brilinta & Pravastatin.  States she is awaiting a call back.

## 2011-01-20 ENCOUNTER — Telehealth: Payer: Self-pay | Admitting: *Deleted

## 2011-01-20 NOTE — Telephone Encounter (Signed)
Noted  

## 2011-01-20 NOTE — Telephone Encounter (Signed)
Agree to got ED

## 2011-01-20 NOTE — Telephone Encounter (Signed)
Patient originally had cath scheduled for Wednesday, January 16 with Dr. Kirke Corin at 9:30.  Patient would have to have been bridged with Lovenox.  After several discussions with patient over last several days - not sure if she can afford Lovenox.  Also, 1/10 - yesterday, she stated that she woke up that morning with chest tightness & SOB.  Had not taken any NTG.  Strongly encouraged her to go to ED for evaluation as she is having these symptoms & has been out of her Brilinta.  We did send rx into [facility] for Plavix 75mg  daily.  Okayed by GD since she was unable to afford since med assist could no longer supply for her.  RX sent on 1/9.  Also, offered samples for her to come by office to pick up.   Patient called back today & left message on voice mail that she could not afford the Lovenox so she would have cancel her cath for next week.  She stated that she would let nature take its course & would probably end up going in soon anyway.   Returned call to patient - advised her that I did cancel cath for 1/16.  Wanted to make sure she had gotten her Plavix filled.  She stated she was going to get it this evening/soon.  States it was $60.00 at [facility].  Told her she may want to call around to a few other pharmacies to find a little cheaper.  Reminded her about samples here for her.  Again, strongly encouraged her to go to ED in light of above.  She states that she will probably go later this evening.

## 2011-01-25 ENCOUNTER — Encounter (HOSPITAL_BASED_OUTPATIENT_CLINIC_OR_DEPARTMENT_OTHER): Admission: RE | Payer: Self-pay | Source: Ambulatory Visit

## 2011-01-25 ENCOUNTER — Inpatient Hospital Stay (HOSPITAL_BASED_OUTPATIENT_CLINIC_OR_DEPARTMENT_OTHER): Admission: RE | Admit: 2011-01-25 | Payer: Medicare Other | Source: Ambulatory Visit | Admitting: Cardiovascular Disease

## 2011-01-25 SURGERY — JV LEFT HEART CATHETERIZATION WITH CORONARY ANGIOGRAM
Anesthesia: Moderate Sedation

## 2011-02-01 ENCOUNTER — Ambulatory Visit (INDEPENDENT_AMBULATORY_CARE_PROVIDER_SITE_OTHER): Payer: Medicare Other | Admitting: *Deleted

## 2011-02-01 DIAGNOSIS — D6851 Activated protein C resistance: Secondary | ICD-10-CM

## 2011-02-01 DIAGNOSIS — Z7901 Long term (current) use of anticoagulants: Secondary | ICD-10-CM

## 2011-02-01 DIAGNOSIS — D6859 Other primary thrombophilia: Secondary | ICD-10-CM

## 2011-02-01 DIAGNOSIS — I2699 Other pulmonary embolism without acute cor pulmonale: Secondary | ICD-10-CM | POA: Diagnosis not present

## 2011-02-15 ENCOUNTER — Ambulatory Visit (INDEPENDENT_AMBULATORY_CARE_PROVIDER_SITE_OTHER): Payer: Medicare Other | Admitting: *Deleted

## 2011-02-15 DIAGNOSIS — I2699 Other pulmonary embolism without acute cor pulmonale: Secondary | ICD-10-CM

## 2011-02-15 DIAGNOSIS — D6859 Other primary thrombophilia: Secondary | ICD-10-CM | POA: Diagnosis not present

## 2011-02-15 DIAGNOSIS — Z7901 Long term (current) use of anticoagulants: Secondary | ICD-10-CM | POA: Diagnosis not present

## 2011-02-15 DIAGNOSIS — D6851 Activated protein C resistance: Secondary | ICD-10-CM

## 2011-02-22 ENCOUNTER — Ambulatory Visit: Payer: Medicare Other | Admitting: Cardiology

## 2011-02-24 ENCOUNTER — Ambulatory Visit (INDEPENDENT_AMBULATORY_CARE_PROVIDER_SITE_OTHER): Payer: Medicare Other | Admitting: *Deleted

## 2011-02-24 ENCOUNTER — Ambulatory Visit (INDEPENDENT_AMBULATORY_CARE_PROVIDER_SITE_OTHER): Payer: Medicare Other | Admitting: Cardiology

## 2011-02-24 ENCOUNTER — Encounter: Payer: Self-pay | Admitting: Cardiology

## 2011-02-24 ENCOUNTER — Other Ambulatory Visit: Payer: Self-pay | Admitting: *Deleted

## 2011-02-24 VITALS — BP 133/80 | HR 94 | Ht 66.0 in | Wt 210.0 lb

## 2011-02-24 DIAGNOSIS — Z7901 Long term (current) use of anticoagulants: Secondary | ICD-10-CM

## 2011-02-24 DIAGNOSIS — I2699 Other pulmonary embolism without acute cor pulmonale: Secondary | ICD-10-CM | POA: Diagnosis not present

## 2011-02-24 DIAGNOSIS — D6859 Other primary thrombophilia: Secondary | ICD-10-CM | POA: Diagnosis not present

## 2011-02-24 DIAGNOSIS — D6851 Activated protein C resistance: Secondary | ICD-10-CM

## 2011-02-24 DIAGNOSIS — I2510 Atherosclerotic heart disease of native coronary artery without angina pectoris: Secondary | ICD-10-CM | POA: Diagnosis not present

## 2011-02-24 MED ORDER — TICAGRELOR 90 MG PO TABS: 90.0000 mg | ORAL_TABLET | Freq: Two times a day (BID) | ORAL | Status: DC

## 2011-02-24 MED ORDER — ISOSORBIDE DINITRATE 20 MG PO TABS: 20.0000 mg | ORAL_TABLET | Freq: Two times a day (BID) | ORAL | Status: DC

## 2011-02-24 NOTE — Patient Instructions (Signed)
Follow up as scheduled. Start Isordil 20 mg two times a day.  [facility] will contact you regarding admission for cath before the end of the month.

## 2011-02-26 NOTE — Progress Notes (Signed)
[name] Bottoms, MD, [facility] ABIM Board Certified in Adult Cardiovascular Medicine,Internal Medicine and Critical Care Medicine    [id number] patient with multiple coronary interventions requiring followup cardiac catheterization  HPI:  The patient is a 61 year old female with a history of hypercoagulable state, secondary to factor V Leiden deficiency and prior history of coronary artery disease. The patient had multiple interventions to the right coronary artery secondary to in-stent restenosis and thrombosis. She presented in July with acute stent thrombosis of the previously placed RCA stent. This was a week after she had stopped taking Plavix. As a matter fact the patient ran out of Plavix. She presented with ST elevation myocardial infarction, cardiogenic shock requiring CPR and intubation with mechanical ventilation. She underwent a bare-metal stent placement to the right coronary artery and was reloaded with Plavix. After she was liberated from mechanical ventilation she developed recurrent substernal chest pain and underwent a repeat catheterization and was found to have acute stent from [facility] of the previously placed stent as well as distal embolus and to the PDA. She was treated with balloon angioplasty and aspiration thrombectomy of both sides. She was then placed on ticagrelor and continued on Coumadin. Fortunately her ejection fraction has remained rather stable with an ejection fraction of 50-55%. The patient presents for followup.  The patient unfortunately was unable to get ticagrelor and has minimal without medications. I told the patient that this is a serious problem. She cannot run out of medications particularly her antiplatelet agents. She is at very high risk for stent restenosis and in-stent thrombosis due to multiple layers of stenting of the ostium of the right coronary artery. The patient also knows that she was told by Dr. Kirke Corin that she will need a followup cardiac  catheterization in the next couple of months. Fortunately she reports no chest pain although she does have shortness of breath which is a chronic problem. She also has COPD.   She reports some increasing shortness of breath and occasional chest tightness on exertion.  She takes occasional nitroglycerin.  She reported taking 3 nitroglycerin over the last 3 weeks. The patient was due to be scheduled for an outpatient cardiac catheterization but could not afford Lovenox.  Also she has not been able to fill her ticagrelor because of financial reasons we started her back on Plavix and make sure that she at least have some coverage.  The plan is now to admit her as an inpatient and bridge her with Lovenox due to her factor V Leiden deficiency and recurrent pulmonary emboli and DVT prior to a followup cardiac catheterization has been recommended on a routine basis by Dr.Arida.      PMH: reviewed and listed in Problem List in Electronic Records (and see below) Past Medical History  Diagnosis Date  . CAD April 2011    Inferior STEMI with multiple angioplasties and stent placement  to RCA.  [name] DVT (deep venous thrombosis)   . Pulmonary embolism   . Factor V Leiden, prothrombin gene mutation   . Hyperlipidemia   . COPD (chronic obstructive pulmonary disease)   . OSA (obstructive sleep apnea)   . Tobacco abuse   . Hypothyroidism    Past Surgical History  Procedure Date  . Cardiac catheterization   . Coronary angioplasty     Allergies/SH/FHX : available in Electronic Records for review  Allergies  Allergen Reactions  . Minocycline Hcl     REACTION: Dizzy  . Prednisone     REACTION: feels like  throat swelling  . Varenicline Tartrate     REACTION: Dizzy  . Zocor (Simvastatin - High Dose) Other (See Comments)    myalgia   History   Social History  . Marital Status: Divorced    Spouse Name: N/A    Number of Children: N/A  . Years of Education: N/A   Occupational History  . Not on  file.   Social History Main Topics  . Smoking status: Current Everyday Smoker -- 0.5 packs/day for 45 years    Types: Cigarettes  . Smokeless tobacco: Never Used  . Alcohol Use: No  . Drug Use: No  . Sexually Active: Not on file   Other Topics Concern  . Not on file   Social History Narrative  . No narrative on file   No family history on file.  Medications: Current Outpatient Prescriptions  Medication Sig Dispense Refill  . acetaminophen (TYLENOL) 500 MG tablet Take 500 mg by mouth every 6 (six) hours as needed.        [name] aspirin 81 MG tablet Take 81 mg by mouth daily.        . clopidogrel (PLAVIX) 75 MG tablet Take 1 tablet (75 mg total) by mouth daily.  30 tablet  6  . fish oil-omega-3 fatty acids 1000 MG capsule Take 2 g by mouth daily.        . Fluticasone-Salmeterol (ADVAIR DISKUS) 250-50 MCG/DOSE AEPB Inhale 1 puff into the lungs every 12 (twelve) hours.        [name] imipramine (TOFRANIL) 25 MG tablet Take 25 mg by mouth at bedtime.        [name] levothyroxine (LEVOTHROID) 125 MCG tablet Take 1 tablet (125 mcg total) by mouth daily.  30 tablet  1  . lisinopril (PRINIVIL,ZESTRIL) 5 MG tablet Take 0.5 tablets (2.5 mg total) by mouth daily.  15 tablet  6  . metoprolol tartrate (LOPRESSOR) 25 MG tablet Take 1 tablet (25 mg total) by mouth 2 (two) times daily.  60 tablet  6  . nitroGLYCERIN (NITROSTAT) 0.4 MG SL tablet Place 1 tablet (0.4 mg total) under the tongue every 5 (five) minutes as needed.  25 tablet  3  . rosuvastatin (CRESTOR) 20 MG tablet Take 20 mg by mouth daily.       [name] tiotropium (SPIRIVA) 18 MCG inhalation capsule Place 18 mcg into inhaler and inhale daily.        [name] warfarin (COUMADIN) 5 MG tablet Take 1 tablet (5 mg total) by mouth as directed. Per coumadin clinic.  60 tablet  6  . isosorbide dinitrate (ISORDIL) 20 MG tablet Take 1 tablet (20 mg total) by mouth 2 (two) times daily.  60 tablet  6  . Ticagrelor (BRILINTA) 90 MG TABS tablet Take 1 tablet (90 mg total) by  mouth 2 (two) times daily.  180 tablet  3    ROS: No nausea or vomiting. No fever or chills.No melena or hematochezia.No bleeding.No claudication.  Physical Exam: BP 133/80  Pulse 94  Ht 5\' 6"  (1.676 m)  Wt 210 lb (95.255 kg)  BMI 33.89 kg/m2 General:well-nourished white female walking with a cane Neck:normal carotid upstrokes and no carotid bruit.  No thyromegaly nonnodular thyroid.  JVP is 5-6 cm Lungs:clear breath sounds bilaterally without wheezing Cardiac:regular rate and rhythm with normal S1 and S2 no pathological murmurs Vascular:no edema.  Normal distal pulses Skin:warm and dry Physcologic:somewhat depressed affect  12lead YSA:YTKZSW sinus rhythm and old inferior wall infarct by EKG Limited bedside  ECHO:N/A   Patient Active Problem List  Diagnoses  . HYPOTHYROIDISM  . HYPERLIPIDEMIA  . TOBACCO ABUSE  . CORONARY ARTERY DISEASE-multiple percutaneous coronary interventions and in-stent restenosis requiring every 6 months followup cardiac catheterizations-ejection fraction 50-55%  . PULMONARY EMBOLISM  . OBSTRUCTIVE CHRONIC BRONCHITIS WITH EXACERBATION  . COPD  . DIZZINESS  . UNSTEADY GAIT  . CHEST PAIN  . SYSTEMIC LUPUS ERYTHEMATOSUS, HX OF  . NEPHROLITHIASIS, HX OF  . 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 Long term current use of anticoagulant-on Coumadin currently also on Plavix unable to afford bridging Lovenox.    PLAN   The patient has finally found out her papers to obtain ticagrelor.  Hopefully she should get her samples soon and then we can switch her from Plavix  Indicating she will need to continue on Coumadin given her high risk for recurrent PEs and DVTs with factor V Leiden deficiency.  INR is stable today at 2.0.  Earlier this month the patient was supposed to have an outpatient cardiac catheterization but she is unable to afford her Lovenox.  We plan to admit her later this month with bridging Lovenox as an inpatient and  then to proceed with a relook cardiac catheterization.  At this time she should also be on ticagrelor.  If there is no significant in-stent restenosis we will plan also to stop aspirin 6 months after her original procedure and continue with ticagrelor .  I also added Isordil 20 mg by mouth twice a day. I discussed the risks and benefits of a diagnostic cardiac catheterization with the patient.  We also discussed the radial versus femoral approach.  In particular I quoted that the risk of bleeding from the radial artery is usually less than 1%.  I also explained however that the procedure is more challenging for the cardiologist and does involve a slightly greater radiation exposure although scientist estimate that increased radiation is equivalent to 20 chest x-rays representing only a small risk of the patient.  The following general risks were quoted to the patient for a diagnostic cardiac catheterization:

## 2011-02-28 ENCOUNTER — Other Ambulatory Visit: Payer: Self-pay | Admitting: Cardiology

## 2011-03-01 ENCOUNTER — Encounter: Payer: Medicare Other | Admitting: *Deleted

## 2011-03-16 ENCOUNTER — Ambulatory Visit (INDEPENDENT_AMBULATORY_CARE_PROVIDER_SITE_OTHER): Payer: Medicare Other | Admitting: *Deleted

## 2011-03-16 DIAGNOSIS — I2699 Other pulmonary embolism without acute cor pulmonale: Secondary | ICD-10-CM

## 2011-03-16 DIAGNOSIS — D6851 Activated protein C resistance: Secondary | ICD-10-CM

## 2011-03-16 DIAGNOSIS — D6859 Other primary thrombophilia: Secondary | ICD-10-CM

## 2011-03-16 DIAGNOSIS — Z7901 Long term (current) use of anticoagulants: Secondary | ICD-10-CM

## 2011-03-23 ENCOUNTER — Telehealth: Payer: Self-pay | Admitting: *Deleted

## 2011-03-23 NOTE — Telephone Encounter (Signed)
Right & left heart cath scheduled for Monday, 3/25 at 7:30 a.m with Dr. Excell Seltzer. She will be admitted on Friday, 3/22 for inpatient Lovenox bridging.

## 2011-03-24 NOTE — Telephone Encounter (Signed)
No precert required.  Medicare only 

## 2011-03-28 ENCOUNTER — Encounter (HOSPITAL_COMMUNITY): Payer: Self-pay | Admitting: Pharmacy Technician

## 2011-03-30 ENCOUNTER — Other Ambulatory Visit: Payer: Self-pay | Admitting: Cardiology

## 2011-03-30 DIAGNOSIS — I2510 Atherosclerotic heart disease of native coronary artery without angina pectoris: Secondary | ICD-10-CM

## 2011-03-30 MED ORDER — ACETAMINOPHEN 325 MG PO TABS: 650.0000 mg | ORAL_TABLET | Freq: Four times a day (QID) | ORAL | Status: DC | PRN

## 2011-03-30 MED ORDER — TRAZODONE 25 MG HALF TABLET: 25.0000 mg | ORAL_TABLET | Freq: Every day | ORAL | Status: DC

## 2011-03-30 MED ORDER — ONDANSETRON HCL 4 MG PO TABS: 4.0000 mg | ORAL_TABLET | Freq: Four times a day (QID) | ORAL | Status: DC | PRN

## 2011-03-30 MED ORDER — HYDROCODONE-ACETAMINOPHEN 5-325 MG PO TABS: 1.0000 | ORAL_TABLET | ORAL | Status: DC | PRN

## 2011-03-30 MED ORDER — ASPIRIN EC 81 MG PO TBEC: 81.0000 mg | DELAYED_RELEASE_TABLET | Freq: Every day | ORAL | Status: DC

## 2011-03-30 MED ORDER — SODIUM CHLORIDE 0.45 % IV SOLN: INTRAVENOUS | Status: DC

## 2011-03-30 NOTE — H&P (Signed)
[name] Bottoms, MD 02/26/2011 10:11 AM Signed  [name] Bottoms, MD, [facility]  ABIM Board Certified in Adult Cardiovascular Medicine,Internal Medicine and Critical Care Medicine  [id number] patient with multiple coronary interventions requiring followup cardiac catheterization  HPI:  The patient is a 61 year old female with a history of hypercoagulable state, secondary to factor V Leiden deficiency and prior history of coronary artery disease. The patient had multiple interventions to the right coronary artery secondary to in-stent restenosis and thrombosis. She presented in July with acute stent thrombosis of the previously placed RCA stent. This was a week after she had stopped taking Plavix. As a matter fact the patient ran out of Plavix. She presented with ST elevation myocardial infarction, cardiogenic shock requiring CPR and intubation with mechanical ventilation. She underwent a bare-metal stent placement to the right coronary artery and was reloaded with Plavix. After she was liberated from mechanical ventilation she developed recurrent substernal chest pain and underwent a repeat catheterization and was found to have acute stent from [facility] of the previously placed stent as well as distal embolus and to the PDA. She was treated with balloon angioplasty and aspiration thrombectomy of both sides. She was then placed on ticagrelor and continued on Coumadin. Fortunately her ejection fraction has remained rather stable with an ejection fraction of 50-55%. The patient presents for followup.  The patient unfortunately was unable to get ticagrelor and has minimal without medications. I told the patient that this is a serious problem. She cannot run out of medications particularly her antiplatelet agents. She is at very high risk for stent restenosis and in-stent thrombosis due to multiple layers of stenting of the ostium of the right coronary artery. The patient also knows that she was told by Dr. Kirke Corin that she will need  a followup cardiac catheterization in the next couple of months. Fortunately she reports no chest pain although she does have shortness of breath which is a chronic problem. She also has COPD.  She reports some increasing shortness of breath and occasional chest tightness on exertion. She takes occasional nitroglycerin. She reported taking 3 nitroglycerin over the last 3 weeks.  The patient was due to be scheduled for an outpatient cardiac catheterization but could not afford Lovenox. Also she has not been able to fill her ticagrelor because of financial reasons we started her back on Plavix and make sure that she at least have some coverage.  The plan is now to admit her as an inpatient and bridge her with Lovenox due to her factor V Leiden deficiency and recurrent pulmonary emboli and DVT prior to a followup cardiac catheterization has been recommended on a routine basis by Dr.Arida.  PMH: reviewed and listed in Problem List in Electronic Records (and see below)  Past Medical History   Diagnosis  Date   .  CAD  April 2011     Inferior STEMI with multiple angioplasties and stent placement to RCA.   [name]  DVT (deep venous thrombosis)    .  Pulmonary embolism    .  Factor V Leiden, prothrombin gene mutation    .  Hyperlipidemia    .  COPD (chronic obstructive pulmonary disease)    .  OSA (obstructive sleep apnea)    .  Tobacco abuse    .  Hypothyroidism     Past Surgical History   Procedure  Date   .  Cardiac catheterization    .  Coronary angioplasty     Allergies/SH/FHX : available in  Electronic Records for review  Allergies   Allergen  Reactions   .  Minocycline Hcl      REACTION: Dizzy   .  Prednisone      REACTION: feels like throat swelling   .  Varenicline Tartrate      REACTION: Dizzy   .  Zocor (Simvastatin - High Dose)  Other (See Comments)     myalgia    History    Social History   .  Marital Status:  Divorced     Spouse Name:  N/A     Number of Children:  N/A   .   Years of Education:  N/A    Occupational History   .  Not on file.    Social History Main Topics   .  Smoking status:  Current Everyday Smoker -- 0.5 packs/day for 45 years     Types:  Cigarettes   .  Smokeless tobacco:  Never Used   .  Alcohol Use:  No   .  Drug Use:  No   .  Sexually Active:  Not on file    Other Topics  Concern   .  Not on file    Social History Narrative   .  No narrative on file    No family history on file.  Medications:  Current Outpatient Prescriptions   Medication  Sig  Dispense  Refill   .  acetaminophen (TYLENOL) 500 MG tablet  Take 500 mg by mouth every 6 (six) hours as needed.     [name]  aspirin 81 MG tablet  Take 81 mg by mouth daily.     .  clopidogrel (PLAVIX) 75 MG tablet  Take 1 tablet (75 mg total) by mouth daily.  30 tablet  6   .  fish oil-omega-3 fatty acids 1000 MG capsule  Take 2 g by mouth daily.     .  Fluticasone-Salmeterol (ADVAIR DISKUS) 250-50 MCG/DOSE AEPB  Inhale 1 puff into the lungs every 12 (twelve) hours.     [name]  imipramine (TOFRANIL) 25 MG tablet  Take 25 mg by mouth at bedtime.     [name]  levothyroxine (LEVOTHROID) 125 MCG tablet  Take 1 tablet (125 mcg total) by mouth daily.  30 tablet  1   .  lisinopril (PRINIVIL,ZESTRIL) 5 MG tablet  Take 0.5 tablets (2.5 mg total) by mouth daily.  15 tablet  6   .  metoprolol tartrate (LOPRESSOR) 25 MG tablet  Take 1 tablet (25 mg total) by mouth 2 (two) times daily.  60 tablet  6   .  nitroGLYCERIN (NITROSTAT) 0.4 MG SL tablet  Place 1 tablet (0.4 mg total) under the tongue every 5 (five) minutes as needed.  25 tablet  3   .  rosuvastatin (CRESTOR) 20 MG tablet  Take 20 mg by mouth daily.     [name]  tiotropium (SPIRIVA) 18 MCG inhalation capsule  Place 18 mcg into inhaler and inhale daily.     [name]  warfarin (COUMADIN) 5 MG tablet  Take 1 tablet (5 mg total) by mouth as directed. Per coumadin clinic.  60 tablet  6   .  isosorbide dinitrate (ISORDIL) 20 MG tablet  Take 1 tablet (20 mg total) by mouth 2  (two) times daily.  60 tablet  6   .  Ticagrelor (BRILINTA) 90 MG TABS tablet  Take 1 tablet (90 mg total) by mouth 2 (two) times daily.  180 tablet  3  ROS: No nausea or vomiting. No fever or chills.No melena or hematochezia.No bleeding.No claudication.  Physical Exam:  BP 133/80  Pulse 94  Ht 5\' 6"  (1.676 m)  Wt 210 lb (95.255 kg)  BMI 33.89 kg/m2  General:well-nourished white female walking with a cane  Neck:normal carotid upstrokes and no carotid bruit. No thyromegaly nonnodular thyroid. JVP is 5-6 cm  Lungs:clear breath sounds bilaterally without wheezing  Cardiac:regular rate and rhythm with normal S1 and S2 no pathological murmurs  Vascular:no edema. Normal distal pulses  Skin:warm and dry  Physcologic:somewhat depressed affect  12lead WUJ:WJXBJY sinus rhythm and old inferior wall infarct by EKG  Limited bedside ECHO:N/A  Patient Active Problem List   Diagnoses   .  HYPOTHYROIDISM   .  HYPERLIPIDEMIA   .  TOBACCO ABUSE   .  CORONARY ARTERY DISEASE-multiple percutaneous coronary interventions and in-stent restenosis requiring every 6 months followup cardiac catheterizations-ejection fraction 50-55%   .  PULMONARY EMBOLISM   .  OBSTRUCTIVE CHRONIC BRONCHITIS WITH EXACERBATION   .  COPD   .  DIZZINESS   .  UNSTEADY GAIT   .  CHEST PAIN   .  SYSTEMIC LUPUS ERYTHEMATOSUS, HX OF   .  NEPHROLITHIASIS, HX OF   .  Factor V Leiden mutation-history of recurrent DVTs and PEs   .  Long term current use of anticoagulant-on Coumadin currently also on Plavix unable to afford bridging Lovenox.   PLAN  The patient has finally found out her papers to obtain ticagrelor. Hopefully she should get her samples soon and then we can switch her from Plavix  Indicating she will need to continue on Coumadin given her high risk for recurrent PEs and DVTs with factor V Leiden deficiency.  INR is stable today at 2.0.  Earlier this month the patient was supposed to have an outpatient cardiac  catheterization but she is unable to afford her Lovenox.  We plan to admit her later this month with bridging Lovenox as an inpatient and then to proceed with a relook cardiac catheterization. At this time she should also be on ticagrelor. If there is no significant in-stent restenosis we will plan also to stop aspirin 6 months after her original procedure and continue with ticagrelor .  I also added Isordil 20 mg by mouth twice a day. I discussed the risks and benefits of a diagnostic cardiac catheterization with the patient.  We also discussed the radial versus femoral approach. In particular I quoted that the risk of bleeding from the radial artery is usually less than 1%. I also explained however that the procedure is more challenging for the cardiologist and does involve a slightly greater radiation exposure although scientist estimate that increased radiation is equivalent to 20 chest x-rays representing only a small risk of the patient.  The following general risks were quoted to the patient for a diagnostic cardiac catheterization:

## 2011-03-31 ENCOUNTER — Inpatient Hospital Stay (HOSPITAL_COMMUNITY)
Admission: AD | Admit: 2011-03-31 | Discharge: 2011-04-09 | DRG: 287 | Disposition: A | Discharge status: Home / Self Care | Payer: Medicare Other | Source: Ambulatory Visit | Attending: Cardiology | Admitting: Cardiology

## 2011-03-31 ENCOUNTER — Encounter (HOSPITAL_COMMUNITY): Payer: Self-pay | Admitting: *Deleted

## 2011-03-31 DIAGNOSIS — R079 Chest pain, unspecified: Secondary | ICD-10-CM | POA: Diagnosis not present

## 2011-03-31 DIAGNOSIS — D682 Hereditary deficiency of other clotting factors: Secondary | ICD-10-CM | POA: Diagnosis not present

## 2011-03-31 DIAGNOSIS — G4733 Obstructive sleep apnea (adult) (pediatric): Secondary | ICD-10-CM | POA: Diagnosis present

## 2011-03-31 DIAGNOSIS — T82897A Other specified complication of cardiac prosthetic devices, implants and grafts, initial encounter: Secondary | ICD-10-CM | POA: Diagnosis not present

## 2011-03-31 DIAGNOSIS — D6859 Other primary thrombophilia: Secondary | ICD-10-CM | POA: Diagnosis not present

## 2011-03-31 DIAGNOSIS — Z79899 Other long term (current) drug therapy: Secondary | ICD-10-CM | POA: Diagnosis not present

## 2011-03-31 DIAGNOSIS — Z7901 Long term (current) use of anticoagulants: Secondary | ICD-10-CM

## 2011-03-31 DIAGNOSIS — Z9861 Coronary angioplasty status: Secondary | ICD-10-CM | POA: Diagnosis not present

## 2011-03-31 DIAGNOSIS — F17200 Nicotine dependence, unspecified, uncomplicated: Secondary | ICD-10-CM | POA: Diagnosis present

## 2011-03-31 DIAGNOSIS — Z86718 Personal history of other venous thrombosis and embolism: Secondary | ICD-10-CM | POA: Diagnosis not present

## 2011-03-31 DIAGNOSIS — Z9119 Patient's noncompliance with other medical treatment and regimen: Secondary | ICD-10-CM

## 2011-03-31 DIAGNOSIS — J4489 Other specified chronic obstructive pulmonary disease: Secondary | ICD-10-CM | POA: Diagnosis present

## 2011-03-31 DIAGNOSIS — Z6834 Body mass index (BMI) 34.0-34.9, adult: Secondary | ICD-10-CM | POA: Diagnosis not present

## 2011-03-31 DIAGNOSIS — Z7902 Long term (current) use of antithrombotics/antiplatelets: Secondary | ICD-10-CM

## 2011-03-31 DIAGNOSIS — Z86711 Personal history of pulmonary embolism: Secondary | ICD-10-CM

## 2011-03-31 DIAGNOSIS — J449 Chronic obstructive pulmonary disease, unspecified: Secondary | ICD-10-CM | POA: Diagnosis present

## 2011-03-31 DIAGNOSIS — I252 Old myocardial infarction: Secondary | ICD-10-CM | POA: Diagnosis not present

## 2011-03-31 DIAGNOSIS — E785 Hyperlipidemia, unspecified: Secondary | ICD-10-CM | POA: Diagnosis present

## 2011-03-31 DIAGNOSIS — I2510 Atherosclerotic heart disease of native coronary artery without angina pectoris: Secondary | ICD-10-CM

## 2011-03-31 DIAGNOSIS — E039 Hypothyroidism, unspecified: Secondary | ICD-10-CM | POA: Diagnosis present

## 2011-03-31 DIAGNOSIS — D6851 Activated protein C resistance: Secondary | ICD-10-CM

## 2011-03-31 DIAGNOSIS — Z91199 Patient's noncompliance with other medical treatment and regimen due to unspecified reason: Secondary | ICD-10-CM

## 2011-03-31 DIAGNOSIS — Y849 Medical procedure, unspecified as the cause of abnormal reaction of the patient, or of later complication, without mention of misadventure at the time of the procedure: Secondary | ICD-10-CM | POA: Diagnosis present

## 2011-03-31 HISTORY — DX: Patient's noncompliance with other medical treatment and regimen due to unspecified reason: Z91.199

## 2011-03-31 HISTORY — DX: Patient's noncompliance with other medical treatment and regimen: Z91.19

## 2011-03-31 LAB — BASIC METABOLIC PANEL
Chloride: 106 mEq/L (ref 96–112)
GFR calc Af Amer: >90 mL/min (ref >90)
GFR calc non Af Amer: >90 mL/min (ref >90)
Potassium: 4 mEq/L (ref 3.5–5.1)
Sodium: 142 mEq/L (ref 135–145)

## 2011-03-31 LAB — PROTIME-INR
INR: 1.51 — ABNORMAL HIGH (ref 0.00–1.49)
Prothrombin Time: 18.5 seconds — ABNORMAL HIGH (ref 11.6–15.2)

## 2011-03-31 LAB — HEPARIN LEVEL (UNFRACTIONATED): Heparin Unfractionated: 0.45 IU/mL (ref 0.30–0.70)

## 2011-03-31 MED ORDER — DIAZEPAM 5 MG PO TABS
5.0000 mg | ORAL_TABLET | ORAL | Status: AC
  Administered 2011-04-03: 5 mg via ORAL
  Filled 2011-03-31: qty 1

## 2011-03-31 MED ORDER — ASPIRIN EC 81 MG PO TBEC
81.0000 mg | DELAYED_RELEASE_TABLET | Freq: Every day | ORAL | Status: DC
  Administered 2011-04-01 – 2011-04-02 (×2): 81 mg via ORAL
  Filled 2011-03-31 (×5): qty 1

## 2011-03-31 MED ORDER — ALBUTEROL SULFATE HFA 108 (90 BASE) MCG/ACT IN AERS
2.0000 | INHALATION_SPRAY | RESPIRATORY_TRACT | Status: DC | PRN
  Administered 2011-03-31 – 2011-04-08 (×11): 2 via RESPIRATORY_TRACT
  Filled 2011-03-31: qty 6.7

## 2011-03-31 MED ORDER — HEPARIN (PORCINE) IN NACL 100-0.45 UNIT/ML-% IJ SOLN
1200.0000 [IU]/h | INTRAMUSCULAR | Status: DC
  Administered 2011-03-31 – 2011-04-03 (×5): 1200 [IU]/h via INTRAVENOUS
  Filled 2011-03-31 (×4): qty 250

## 2011-03-31 MED ORDER — SODIUM CHLORIDE 0.9 % IV SOLN
250.0000 mL | INTRAVENOUS | Status: DC | PRN
Start: 2011-03-31 — End: 2011-04-03
  Administered 2011-04-01: 500 mL via INTRAVENOUS
  Administered 2011-04-02: 250 mL via INTRAVENOUS

## 2011-03-31 MED ORDER — ASPIRIN 81 MG PO CHEW: 324.0000 mg | CHEWABLE_TABLET | ORAL | Status: DC

## 2011-03-31 MED ORDER — METOPROLOL TARTRATE 25 MG PO TABS
25.0000 mg | ORAL_TABLET | Freq: Two times a day (BID) | ORAL | Status: DC
  Administered 2011-03-31 – 2011-04-09 (×18): 25 mg via ORAL
  Filled 2011-03-31 (×19): qty 1

## 2011-03-31 MED ORDER — HEPARIN BOLUS VIA INFUSION
2500.0000 [IU] | Freq: Once | INTRAVENOUS | Status: AC
  Administered 2011-03-31: 2500 [IU] via INTRAVENOUS
  Filled 2011-03-31: qty 2500

## 2011-03-31 MED ORDER — OMEGA-3 FATTY ACIDS 1000 MG PO CAPS: 2.0000 g | ORAL_CAPSULE | Freq: Every day | ORAL | Status: DC

## 2011-03-31 MED ORDER — LEVOTHYROXINE SODIUM 125 MCG PO TABS
125.0000 ug | ORAL_TABLET | Freq: Every day | ORAL | Status: DC
  Administered 2011-04-01 – 2011-04-09 (×9): 125 ug via ORAL
  Filled 2011-03-31 (×9): qty 1

## 2011-03-31 MED ORDER — ACETAMINOPHEN 500 MG PO TABS
500.0000 mg | ORAL_TABLET | Freq: Three times a day (TID) | ORAL | Status: DC | PRN
  Filled 2011-03-31: qty 1

## 2011-03-31 MED ORDER — TRAZODONE 25 MG HALF TABLET
25.0000 mg | ORAL_TABLET | Freq: Every evening | ORAL | Status: DC | PRN
  Administered 2011-04-03 – 2011-04-07 (×2): 25 mg via ORAL
  Filled 2011-03-31 (×2): qty 1

## 2011-03-31 MED ORDER — IMIPRAMINE HCL 25 MG PO TABS
25.0000 mg | ORAL_TABLET | Freq: Every day | ORAL | Status: DC
  Administered 2011-03-31 – 2011-04-08 (×9): 25 mg via ORAL
  Filled 2011-03-31 (×13): qty 1

## 2011-03-31 MED ORDER — FLUTICASONE-SALMETEROL 250-50 MCG/DOSE IN AEPB
1.0000 | INHALATION_SPRAY | Freq: Two times a day (BID) | RESPIRATORY_TRACT | Status: DC
  Administered 2011-03-31 – 2011-04-09 (×17): 1 via RESPIRATORY_TRACT
  Filled 2011-03-31 (×2): qty 14

## 2011-03-31 MED ORDER — OMEGA-3-ACID ETHYL ESTERS 1 G PO CAPS
1.0000 g | ORAL_CAPSULE | Freq: Every day | ORAL | Status: DC
  Administered 2011-03-31 – 2011-04-09 (×10): 1 g via ORAL
  Filled 2011-03-31 (×10): qty 1

## 2011-03-31 MED ORDER — PNEUMOCOCCAL VAC POLYVALENT 25 MCG/0.5ML IJ INJ
0.5000 mL | INJECTION | INTRAMUSCULAR | Status: AC
  Administered 2011-04-01: 0.5 mL via INTRAMUSCULAR
  Filled 2011-03-31: qty 0.5

## 2011-03-31 MED ORDER — TIOTROPIUM BROMIDE MONOHYDRATE 18 MCG IN CAPS
18.0000 ug | ORAL_CAPSULE | Freq: Every day | RESPIRATORY_TRACT | Status: DC
  Administered 2011-03-31 – 2011-04-08 (×9): 18 ug via RESPIRATORY_TRACT
  Filled 2011-03-31 (×3): qty 5

## 2011-03-31 MED ORDER — ISOSORBIDE DINITRATE 20 MG PO TABS
20.0000 mg | ORAL_TABLET | Freq: Two times a day (BID) | ORAL | Status: DC
  Administered 2011-03-31 – 2011-04-09 (×18): 20 mg via ORAL
  Filled 2011-03-31 (×20): qty 1

## 2011-03-31 MED ORDER — TICAGRELOR 90 MG PO TABS: 90.0000 mg | ORAL_TABLET | Freq: Two times a day (BID) | ORAL | Status: DC

## 2011-03-31 MED ORDER — SODIUM CHLORIDE 0.9 % IJ SOLN
3.0000 mL | Freq: Two times a day (BID) | INTRAMUSCULAR | Status: DC
  Administered 2011-04-01 – 2011-04-02 (×3): 3 mL via INTRAVENOUS

## 2011-03-31 MED ORDER — ONDANSETRON HCL 4 MG/2ML IJ SOLN: 4.0000 mg | Freq: Four times a day (QID) | INTRAMUSCULAR | Status: DC | PRN

## 2011-03-31 MED ORDER — LISINOPRIL 2.5 MG PO TABS
2.5000 mg | ORAL_TABLET | Freq: Every day | ORAL | Status: DC
  Administered 2011-04-01 – 2011-04-09 (×9): 2.5 mg via ORAL
  Filled 2011-03-31 (×9): qty 1

## 2011-03-31 MED ORDER — ATORVASTATIN CALCIUM 40 MG PO TABS
40.0000 mg | ORAL_TABLET | Freq: Every day | ORAL | Status: DC
  Administered 2011-03-31 – 2011-04-08 (×9): 40 mg via ORAL
  Filled 2011-03-31 (×11): qty 1

## 2011-03-31 MED ORDER — TICAGRELOR 90 MG PO TABS
90.0000 mg | ORAL_TABLET | Freq: Two times a day (BID) | ORAL | Status: DC
  Filled 2011-03-31: qty 1

## 2011-03-31 MED ORDER — TICAGRELOR 90 MG PO TABS
90.0000 mg | ORAL_TABLET | Freq: Two times a day (BID) | ORAL | Status: DC
  Administered 2011-03-31 – 2011-04-09 (×18): 90 mg via ORAL
  Filled 2011-03-31 (×21): qty 1

## 2011-03-31 MED ORDER — HYDROCODONE-ACETAMINOPHEN 5-325 MG PO TABS: 1.0000 | ORAL_TABLET | ORAL | Status: DC | PRN

## 2011-03-31 MED ORDER — ASPIRIN 81 MG PO TABS: 81.0000 mg | ORAL_TABLET | Freq: Every day | ORAL | Status: DC

## 2011-03-31 MED ORDER — SODIUM CHLORIDE 0.9 % IJ SOLN: 3.0000 mL | INTRAMUSCULAR | Status: DC | PRN

## 2011-03-31 MED ORDER — TRAZODONE 25 MG HALF TABLET: 25.0000 mg | ORAL_TABLET | Freq: Every day | ORAL | Status: DC

## 2011-03-31 NOTE — Progress Notes (Signed)
ANTICOAGULATION CONSULT NOTE - Follow Up Consult  Pharmacy Consult for Heparin Indication: Hx Factor V Leiden deficiency and new ACS sx  Patient Measurements: Height: 5\' 6"  (167.6 cm) Weight: 201 lb 14.4 oz (91.581 kg) IBW/kg (Calculated) : 59.3  Heparin Dosing Weight: 79.3 kg  Labs:  Basename 03/31/11 2155 03/31/11 1341  HGB -- --  HCT -- --  PLT -- --  APTT -- --  LABPROT -- 18.5*  INR -- 1.51*  HEPARINUNFRC 0.45 --  CREATININE -- 0.69  CKTOTAL -- --  CKMB -- --  TROPONINI -- --   Estimated Creatinine Clearance: 85.2 ml/min (by C-G formula based on Cr of 0.69).  Assessment: 61 y.o. F with hx Factor V Leiden deficiency admitted with ACS sx and SUBtherapeutic INR. The patient is currently on heparin for bridging with INR <2 while warfarin is on hold for cardiac cath plans. Heparin level this evening is therapeutic (HL 0.45, goal of 0.3-0.7) though drawn ~1 hr early. No s/sx of bleeding noted per nurse report.  Goal of Therapy:  Heparin level 0.3-0.7 units/ml   Plan:  1. Continue heparin at current rate of 1200 units/hr (12 ml/hr) 2. Will continue to monitor for any signs/symptoms of bleeding and will follow up with heparin level in the a.m to confirm.  Georgina Pillion, PharmD, BCPS Clinical Pharmacist Pager: [phone] 03/31/2011 10:44 PM

## 2011-03-31 NOTE — Progress Notes (Signed)
Patient is a fall risk because of an history of an history of fall but refused the bed alarm and do not want to call for assistance to the bathroom, stated that she lives alone and she is an independent person.

## 2011-03-31 NOTE — Progress Notes (Addendum)
ANTICOAGULATION CONSULT NOTE - Initial Consult  Pharmacy Consult for Heparin when INR<2 Indication: Factor V Leiden def.  Allergies  Allergen Reactions  . Minocycline Hcl     REACTION: Dizzy  . Prednisone     REACTION: feels like throat swelling  . Varenicline Tartrate     REACTION: Dizzy  . Zocor (Simvastatin - High Dose) Other (See Comments)    myalgia    Patient Measurements: Height: 5\' 6"  (167.6 cm) Weight: 201 lb 14.4 oz (91.581 kg) IBW/kg (Calculated) : 59.3  Heparin Dosing Weight: 79  Vital Signs: Temp: 97.7 F (36.5 C) (03/22 1338) Temp src: Oral (03/22 1338) BP: 135/79 mmHg (03/22 1338) Pulse Rate: 91  (03/22 1338)  Labs: No results found for this basename: HGB:2,HCT:3,PLT:3,APTT:3,LABPROT:3,INR:3,HEPARINUNFRC:3,CREATININE:3,CKTOTAL:3,CKMB:3,TROPONINI:3 in the last 72 hours Estimated Creatinine Clearance: 85.2 ml/min (by C-G formula based on Cr of 0.64).  Medical History: Past Medical History  Diagnosis Date  . CAD April 2011    Inferior STEMI with multiple angioplasties and stent placement  to RCA.  [name] DVT (deep venous thrombosis)   . Pulmonary embolism   . Factor V Leiden, prothrombin gene mutation   . Hyperlipidemia   . COPD (chronic obstructive pulmonary disease)   . OSA (obstructive sleep apnea)   . Tobacco abuse   . Hypothyroidism     Medications:  Prescriptions prior to admission  Medication Sig Dispense Refill  . acetaminophen (TYLENOL) 500 MG tablet Take 500 mg by mouth every 8 (eight) hours as needed. For headache      . albuterol (PROVENTIL HFA;VENTOLIN HFA) 108 (90 BASE) MCG/ACT inhaler Inhale 2 puffs into the lungs every 4 (four) hours as needed. For shortness of breath      . fish oil-omega-3 fatty acids 1000 MG capsule Take 2 g by mouth daily.        . Fluticasone-Salmeterol (ADVAIR DISKUS) 250-50 MCG/DOSE AEPB Inhale 1 puff into the lungs every 12 (twelve) hours.        [name] imipramine (TOFRANIL) 25 MG tablet Take 25 mg by mouth at bedtime.         . isosorbide dinitrate (ISORDIL) 20 MG tablet Take 1 tablet (20 mg total) by mouth 2 (two) times daily.  60 tablet  6  . levothyroxine (SYNTHROID, LEVOTHROID) 125 MCG tablet Take 125 mcg by mouth daily.      [name] lisinopril (PRINIVIL,ZESTRIL) 5 MG tablet Take 0.5 tablets (2.5 mg total) by mouth daily.  15 tablet  6  . metoprolol tartrate (LOPRESSOR) 25 MG tablet Take 1 tablet (25 mg total) by mouth 2 (two) times daily.  60 tablet  6  . nitroGLYCERIN (NITROSTAT) 0.4 MG SL tablet Place 0.4 mg under the tongue every 5 (five) minutes as needed. For chest pain      . rosuvastatin (CRESTOR) 20 MG tablet Take 20 mg by mouth daily.       . Ticagrelor (BRILINTA) 90 MG TABS tablet Take 1 tablet (90 mg total) by mouth 2 (two) times daily.  180 tablet  3  . tiotropium (SPIRIVA) 18 MCG inhalation capsule Place 18 mcg into inhaler and inhale daily.        [name] aspirin 81 MG tablet Take 81 mg by mouth daily.          Assessment: Bridging from Coumadin to heparin (Factor V Leiden def.+ h/o DVT/PE) for cath Monday 3/25. INR reported to be 2 at MD office today. INR now 1.5 during inpatient measurement.  Anticoag: IV heparin pending cath Monday.  Cards: Known CAD, HLD with multiple coronary interventions. Has financial issue affording her antiplatelet medications.  Endo: hypothyroid  Pulm: COPD, OSA, tobacco abuse  Goal of Therapy:  Heparin level 0.3-0.7   Plan:  Start IV heparin (1/2 bolus) 2500 units and infusion at 1200 units/hr. Check heparin level 6hrs after heparin starts and daily.  Jaedah Lords S. Merilynn Finland, PharmD, BCPS Clinical Staff Pharmacist Pager [phone]  03/31/2011,2:17 PM

## 2011-04-01 DIAGNOSIS — I2510 Atherosclerotic heart disease of native coronary artery without angina pectoris: Secondary | ICD-10-CM

## 2011-04-01 LAB — CBC
MCHC: 32.7 g/dL (ref 30.0–36.0)
RDW: 13.9 % (ref 11.5–15.5)

## 2011-04-01 LAB — HEPARIN LEVEL (UNFRACTIONATED): Heparin Unfractionated: 0.66 IU/mL (ref 0.30–0.70)

## 2011-04-01 NOTE — Progress Notes (Signed)
  Patient Name: [name]      SUBJECTIVE: Patient admitted for catheterization. She has a history of a hypercoagulable state coronary artery disease prior RCA stenting with in-stent restenosis. She also had a history of acute stent thrombosis in July with associated STEMI and shock. She was treated with home and aspiration atherectomy. Ejection fraction was normal.  She was seen in the morning and found to have come off of her antiplatelet drugs during she is put on  Brilinta  She is being treated with heparin given her history of recurrent pulmonary embolism.  She has chest pain but this is typical for her. Past Medical History  Diagnosis Date  . CAD April 2011    Inferior STEMI with multiple angioplasties and stent placement  to RCA.  [name] DVT (deep venous thrombosis)   . Pulmonary embolism   . Factor V Leiden, prothrombin gene mutation   . Hyperlipidemia   . COPD (chronic obstructive pulmonary disease)   . OSA (obstructive sleep apnea)   . Tobacco abuse   . Hypothyroidism     PHYSICAL EXAM Filed Vitals:   03/31/11 1338 03/31/11 2035 03/31/11 2130 04/01/11 0845  BP: 135/79  118/65   Pulse: 91  86   Temp: 97.7 F (36.5 C)  98 F (36.7 C)   TempSrc: Oral     Resp: 20  16   Height: 5\' 6"  (1.676 m)     Weight: 201 lb 14.4 oz (91.581 kg)     SpO2: 94% 92% 94% 90%    Well developed and nourished in no acute distress HENT normal Neck supple with JVP-flat Clear Regular rate and rhythm, no murmurs or gallops Abd-soft with active BS No Clubbing cyanosis edema Skin-warm and dry A & Oriented  Grossly normal sensory and motor function   TELEMETRY: Reviewed telemetry pt in *nsr    Intake/Output Summary (Last 24 hours) at 04/01/11 1120 Last data filed at 04/01/11 0900  Gross per 24 hour  Intake    300 ml  Output      0 ml  Net    300 ml    LABS: Basic Metabolic Panel:  Lab 03/31/11 1610  NA 142  K 4.0  CL 106  CO2 28  GLUCOSE 98  BUN 15  CREATININE 0.69    CALCIUM 9.3  MG --  PHOS --   Cardiac Enzymes: No results found for this basename: CKTOTAL:3,CKMB:3,CKMBINDEX:3,TROPONINI:3 in the last 72 hours CBC:  Lab 04/01/11 0500  WBC 6.5  NEUTROABS --  HGB 13.9  HCT 42.5  MCV 95.3  PLT 247   PROTIME:  Basename 03/31/11 1341  LABPROT 18.5*  INR 1.51*      ASSESSMENT AND PLAN: Patient admitted for catheterization with a prior history of stenting of her right coronary artery with multiple complications including in-stent restenosis and acute stent thrombosis. She has factor V Leiden deficiency and recurrent pulmonary embolism she is on heparin bridging. We will continue that.  Orders are written for catheterization  Signed, Sherryl Manges MD  04/01/2011

## 2011-04-01 NOTE — Progress Notes (Signed)
ANTICOAGULATION CONSULT NOTE - Follow Up Consult  Pharmacy Consult for Heparin Indication: Hx Factor V Leiden deficeincy and new ACS sx  Allergies  Allergen Reactions  . Minocycline Hcl     REACTION: Dizzy  . Prednisone     REACTION: feels like throat swelling  . Varenicline Tartrate     REACTION: Dizzy  . Zocor (Simvastatin - High Dose) Other (See Comments)    myalgia    Patient Measurements: Height: 5\' 6"  (167.6 cm) Weight: 201 lb 14.4 oz (91.581 kg) IBW/kg (Calculated) : 59.3  Heparin Dosing Weight:   Vital Signs:    Labs:  Basename 04/01/11 0500 03/31/11 2155 03/31/11 1341  HGB 13.9 -- --  HCT 42.5 -- --  PLT 247 -- --  APTT -- -- --  LABPROT -- -- 18.5*  INR -- -- 1.51*  HEPARINUNFRC 0.66 0.45 --  CREATININE -- -- 0.69  CKTOTAL -- -- --  CKMB -- -- --  TROPONINI -- -- --   Estimated Creatinine Clearance: 85.2 ml/min (by C-G formula based on Cr of 0.69).     Assessment: 61 yr old female admitted with hx Factor V Leiden deficiency admitted with ACS sx and subtherapeutic INR. The pt is currently on heparin for bridging while heparin is on hold for cardiac cath plans on Mon. Heparin level is 0.66, therapeutic. Goal of Therapy:  Heparin level 0.3-0.7 units/ml   Plan:  Will continue heparin at current rate of 1200 units/hr. Will continue to monitor for any signs/symptoms of bleeding and f/u daily am heparin levels.   [name] Garnet 04/01/2011,11:03 AM

## 2011-04-02 LAB — CBC
MCHC: 32.6 g/dL (ref 30.0–36.0)
Platelets: 227 10*3/uL (ref 150–400)
RDW: 13.8 % (ref 11.5–15.5)
WBC: 7.6 10*3/uL (ref 4.0–10.5)

## 2011-04-02 LAB — HEPARIN LEVEL (UNFRACTIONATED): Heparin Unfractionated: 0.67 IU/mL (ref 0.30–0.70)

## 2011-04-02 NOTE — Progress Notes (Signed)
ANTICOAGULATION CONSULT NOTE - Follow Up Consult  Pharmacy Consult for Heparin Indication: Hx Factor V Leiden deficiency and new ACS sx  Allergies  Allergen Reactions  . Minocycline Hcl     REACTION: Dizzy  . Prednisone     REACTION: feels like throat swelling  . Varenicline Tartrate     REACTION: Dizzy  . Zocor (Simvastatin - High Dose) Other (See Comments)    myalgia    Patient Measurements: Height: 5\' 6"  (167.6 cm) Weight: 201 lb 14.4 oz (91.581 kg) IBW/kg (Calculated) : 59.3  Heparin Dosing Weight:   Vital Signs: Temp: 98.2 F (36.8 C) (03/24 0550) BP: 110/58 mmHg (03/24 0550) Pulse Rate: 71  (03/24 0550)  Labs:  Basename 04/02/11 0615 04/01/11 0500 03/31/11 2155 03/31/11 1341  HGB 13.7 13.9 -- --  HCT 42.0 42.5 -- --  PLT 227 247 -- --  APTT -- -- -- --  LABPROT -- -- -- 18.5*  INR -- -- -- 1.51*  HEPARINUNFRC 0.67 0.66 0.45 --  CREATININE -- -- -- 0.69  CKTOTAL -- -- -- --  CKMB -- -- -- --  TROPONINI -- -- -- --   Estimated Creatinine Clearance: 85.2 ml/min (by C-G formula based on Cr of 0.69).    Assessment: Heparin level is 0.67 on 1200 units/hr heparin. Pt is scheduled for cardiac cath on Mon. Heparin level is therapeutic.  Goal of Therapy:  Heparin level 0.3-0.7 units/ml   Plan:  Heparin level is therapeutic. Will continue same rate. F/U resumption of coumadin after cardiac cath.  [name] Garnet 04/02/2011,9:12 AM

## 2011-04-02 NOTE — Plan of Care (Signed)
Problem: Consults Goal: Cardiac Cath Patient Education (See Patient Education module for education specifics.) Outcome: Completed/Met Date Met:  04/02/11 Pt has had 4 cardiac caths in the past including one using the right radial. Teaching/video offered pt denies any questions about procedure and is comfortable with current knowledge base

## 2011-04-02 NOTE — Progress Notes (Signed)
  Patient Name: [name]      SUBJECTIVE: Patient admitted for catheterization. She has a history of a hypercoagulable state coronary artery disease prior RCA stenting with in-stent restenosis. She also had a history of acute stent thrombosis in July with associated STEMI and shock. She was treated with home and aspiration atherectomy. Ejection fraction was normal.  She was seen in February and found to have come off of her antiplatelet drug; at that point she was put on  Brilinta  She is being bridged with heparin given her history of recurrent pulmonary embolism.  Slept well without significant chest pain or shortness of breath Past Medical History  Diagnosis Date  . CAD April 2011    Inferior STEMI with multiple angioplasties and stent placement  to RCA.  . DVT (deep venous thrombosis)   . Pulmonary embolism   . Factor V Leiden, prothrombin gene mutation   . Hyperlipidemia   . COPD (chronic obstructive pulmonary disease)   . OSA (obstructive sleep apnea)   . Tobacco abuse   . Hypothyroidism     PHYSICAL EXAM Filed Vitals:   04/01/11 2037 04/01/11 2145 04/01/11 2152 04/02/11 0550  BP:  120/54 120/58 110/58  Pulse:  75 88 71  Temp:  98.3 F (36.8 C)  98.2 F (36.8 C)  TempSrc:      Resp:  16  14  Height:      Weight:      SpO2: 93% 97%  94%    Well developed and nourished in no acute distress HENT normal Neck supple with JVP-flat Clear Regular rate and rhythm, no murmurs or gallops Abd-soft with active BS No Clubbing cyanosis edema Skin-warm and dry A & Oriented  Grossly normal sensory and motor function   TELEMETRY: Reviewed telemetry pt in *nsr    Intake/Output Summary (Last 24 hours) at 04/02/11 0758 Last data filed at 04/02/11 0600  Gross per 24 hour  Intake 1167.6 ml  Output      0 ml  Net 1167.6 ml    LABS: Basic Metabolic Panel:  Lab 03/31/11 1341  NA 142  K 4.0  CL 106  CO2 28  GLUCOSE 98  BUN 15  CREATININE 0.69  CALCIUM 9.3    MG --  PHOS --   Cardiac Enzymes: No results found for this basename: CKTOTAL:3,CKMB:3,CKMBINDEX:3,TROPONINI:3 in the last 72 hours CBC:  Lab 04/02/11 0615 04/01/11 0500  WBC 7.6 6.5  NEUTROABS -- --  HGB 13.7 13.9  HCT 42.0 42.5  MCV 93.1 95.3  PLT 227 247   PROTIME:  Basename 03/31/11 1341  LABPROT 18.5*  INR 1.51*      ASSESSMENT AND PLAN: Patient admitted for catheterization with a prior history of stenting of her right coronary artery with multiple complications including in-stent restenosis and acute stent thrombosis. She has factor V Leiden deficiency and recurrent pulmonary embolism she is on heparin bridging. We will continue that.  Orders are written for catheterization  Signed, Rufino Staup MD  04/02/2011   

## 2011-04-03 ENCOUNTER — Ambulatory Visit (HOSPITAL_COMMUNITY): Admission: RE | Admit: 2011-04-03 | Payer: Medicare Other | Source: Ambulatory Visit | Admitting: Cardiovascular Disease

## 2011-04-03 ENCOUNTER — Encounter (HOSPITAL_COMMUNITY)
Admission: AD | Disposition: A | Discharge status: Home / Self Care | Payer: Self-pay | Source: Ambulatory Visit | Attending: Cardiology

## 2011-04-03 ENCOUNTER — Other Ambulatory Visit: Payer: Self-pay

## 2011-04-03 DIAGNOSIS — I2510 Atherosclerotic heart disease of native coronary artery without angina pectoris: Secondary | ICD-10-CM

## 2011-04-03 HISTORY — PX: LEFT AND RIGHT HEART CATHETERIZATION WITH CORONARY ANGIOGRAM: SHX5449

## 2011-04-03 LAB — CREATININE, SERUM: GFR calc non Af Amer: >90 mL/min (ref >90)

## 2011-04-03 LAB — CBC
HCT: 42.7 % (ref 36.0–46.0)
Hemoglobin: 13.8 g/dL (ref 12.0–15.0)
MCH: 31 pg (ref 26.0–34.0)
MCH: 30.3 pg (ref 26.0–34.0)
MCHC: 32.3 g/dL (ref 30.0–36.0)
Platelets: 239 10*3/uL (ref 150–400)
RBC: 4.55 MIL/uL (ref 3.87–5.11)
RDW: 13.7 % (ref 11.5–15.5)
RDW: 13.6 % (ref 11.5–15.5)
WBC: 8 10*3/uL (ref 4.0–10.5)

## 2011-04-03 SURGERY — LEFT AND RIGHT HEART CATHETERIZATION WITH CORONARY ANGIOGRAM
Anesthesia: LOCAL

## 2011-04-03 MED ORDER — LIDOCAINE HCL (PF) 1 % IJ SOLN
INTRAMUSCULAR | Status: AC
  Filled 2011-04-03: qty 30

## 2011-04-03 MED ORDER — SODIUM CHLORIDE 0.9 % IJ SOLN: 3.0000 mL | Freq: Two times a day (BID) | INTRAMUSCULAR | Status: DC

## 2011-04-03 MED ORDER — ENOXAPARIN SODIUM 100 MG/ML ~~LOC~~ SOLN
1.0000 mg/kg | Freq: Two times a day (BID) | SUBCUTANEOUS | Status: DC
  Administered 2011-04-03 – 2011-04-09 (×12): 90 mg via SUBCUTANEOUS
  Filled 2011-04-03 (×15): qty 1

## 2011-04-03 MED ORDER — WARFARIN - PHARMACIST DOSING INPATIENT: Freq: Every day | Status: DC

## 2011-04-03 MED ORDER — SODIUM CHLORIDE 0.9 % IJ SOLN: 3.0000 mL | INTRAMUSCULAR | Status: DC | PRN

## 2011-04-03 MED ORDER — ONDANSETRON HCL 4 MG/2ML IJ SOLN: 4.0000 mg | Freq: Four times a day (QID) | INTRAMUSCULAR | Status: DC | PRN

## 2011-04-03 MED ORDER — WARFARIN SODIUM 10 MG PO TABS
10.0000 mg | ORAL_TABLET | Freq: Once | ORAL | Status: AC
  Administered 2011-04-03: 10 mg via ORAL
  Filled 2011-04-03: qty 1

## 2011-04-03 MED ORDER — SODIUM CHLORIDE 0.9 % IV SOLN
1.0000 mL/kg/h | INTRAVENOUS | Status: AC
  Administered 2011-04-03: 1 mL/kg/h via INTRAVENOUS

## 2011-04-03 MED ORDER — SODIUM CHLORIDE 0.9 % IV SOLN: 250.0000 mL | INTRAVENOUS | Status: DC

## 2011-04-03 MED ORDER — MIDAZOLAM HCL 2 MG/2ML IJ SOLN
INTRAMUSCULAR | Status: AC
  Filled 2011-04-03: qty 2

## 2011-04-03 MED ORDER — FENTANYL CITRATE 0.05 MG/ML IJ SOLN
INTRAMUSCULAR | Status: AC
  Filled 2011-04-03: qty 2

## 2011-04-03 MED ORDER — HEPARIN (PORCINE) IN NACL 2-0.9 UNIT/ML-% IJ SOLN
INTRAMUSCULAR | Status: AC
  Filled 2011-04-03: qty 2000

## 2011-04-03 MED ORDER — ACETAMINOPHEN 325 MG PO TABS: 650.0000 mg | ORAL_TABLET | ORAL | Status: DC | PRN

## 2011-04-03 MED ORDER — NITROGLYCERIN 0.2 MG/ML ON CALL CATH LAB
INTRAVENOUS | Status: AC
  Filled 2011-04-03: qty 1

## 2011-04-03 NOTE — Progress Notes (Signed)
ANTICOAGULATION CONSULT NOTE - Follow Up Consult  Pharmacy Consult for coumdain/lovenox Indication: Hx Factor V Leiden deficiency and new ACS sx, s/p cath 04/03/11  Allergies  Allergen Reactions  . Minocycline Hcl     REACTION: Dizzy  . Prednisone     REACTION: feels like throat swelling  . Varenicline Tartrate     REACTION: Dizzy  . Zocor (Simvastatin - High Dose) Other (See Comments)    myalgia    Patient Measurements: Height: 5\' 6"  (167.6 cm) Weight: 201 lb 14.4 oz (91.581 kg) IBW/kg (Calculated) : 59.3  Heparin Dosing Weight:   Vital Signs: Temp: 97.7 F (36.5 C) (03/25 1051) Temp src: Oral (03/25 1051) BP: 133/77 mmHg (03/25 1051) Pulse Rate: 74  (03/25 1051)  Labs:  Basename 04/03/11 0710 04/02/11 0615 04/01/11 0500 03/31/11 1341  HGB 14.1 13.7 -- --  HCT 42.8 42.0 42.5 --  PLT 239 227 247 --  APTT -- -- -- --  LABPROT -- -- -- 18.5*  INR -- -- -- 1.51*  HEPARINUNFRC 0.53 0.67 0.66 --  CREATININE -- -- -- 0.69  CKTOTAL -- -- -- --  CKMB -- -- -- --  TROPONINI -- -- -- --   Estimated Creatinine Clearance: 85.2 ml/min (by C-G formula based on Cr of 0.69).    Assessment: Mrs. Stepney is s/p cath today and to restart coumadin tonight and lovenox bridge per MD  For Factor V Leiden, hx PE, hx DVT. Her home dose of coumadin is 7.5 mg daily x 5 mg on Sunday.  Her last coumadin dose was last Wednesday.  INR on Friday was 1.55.  Expect INR to be baseline now.  No bleeding reported.  Arm OK after radial cath approach.   Goal of Therapy:  INR 2-3   Plan:  1. LMWH 1 mg/kg sq q12 ordered by MD = 90 mg q 12h to start tonight at 8pm. 2. Coumadin 10 mg po x 1 dose today 3. Daily INR 4. Education completed.  She goes to [facility] coumadin clinic in [place] and sees [place].   5.  She needs to get a [place] MD to fill her synthroid rx.   Herby Abraham, Pharm.D. [phone] 04/03/2011 11:38 AM

## 2011-04-03 NOTE — Interval H&P Note (Signed)
History and Physical Interval Note:  04/03/2011 8:01 AM  [name]  has presented today for surgery, with the diagnosis of chest pain/cad  The various methods of treatment have been discussed with the patient and family. After consideration of risks, benefits and other options for treatment, the patient has consented to  Procedure(s) (LRB): LEFT AND RIGHT HEART CATHETERIZATION WITH CORONARY ANGIOGRAM (N/A) as a surgical intervention .  The patients' history has been reviewed, patient examined, no change in status, stable for surgery.  I have reviewed the patients' chart and labs.  Questions were answered to the patient's satisfaction.     Tonny Bollman 04/03/2011 8:02 AM

## 2011-04-03 NOTE — H&P (View-Only) (Signed)
  Patient Name: [name]      SUBJECTIVE: Patient admitted for catheterization. She has a history of a hypercoagulable state coronary artery disease prior RCA stenting with in-stent restenosis. She also had a history of acute stent thrombosis in July with associated STEMI and shock. She was treated with home and aspiration atherectomy. Ejection fraction was normal.  She was seen in February and found to have come off of her antiplatelet drug; at that point she was put on  Brilinta  She is being bridged with heparin given her history of recurrent pulmonary embolism.  Slept well without significant chest pain or shortness of breath Past Medical History  Diagnosis Date  . CAD April 2011    Inferior STEMI with multiple angioplasties and stent placement  to RCA.  [name] DVT (deep venous thrombosis)   . Pulmonary embolism   . Factor V Leiden, prothrombin gene mutation   . Hyperlipidemia   . COPD (chronic obstructive pulmonary disease)   . OSA (obstructive sleep apnea)   . Tobacco abuse   . Hypothyroidism     PHYSICAL EXAM Filed Vitals:   04/01/11 2037 04/01/11 2145 04/01/11 2152 04/02/11 0550  BP:  120/54 120/58 110/58  Pulse:  75 88 71  Temp:  98.3 F (36.8 C)  98.2 F (36.8 C)  TempSrc:      Resp:  16  14  Height:      Weight:      SpO2: 93% 97%  94%    Well developed and nourished in no acute distress HENT normal Neck supple with JVP-flat Clear Regular rate and rhythm, no murmurs or gallops Abd-soft with active BS No Clubbing cyanosis edema Skin-warm and dry A & Oriented  Grossly normal sensory and motor function   TELEMETRY: Reviewed telemetry pt in *nsr    Intake/Output Summary (Last 24 hours) at 04/02/11 0758 Last data filed at 04/02/11 0600  Gross per 24 hour  Intake 1167.6 ml  Output      0 ml  Net 1167.6 ml    LABS: Basic Metabolic Panel:  Lab 03/31/11 1610  NA 142  K 4.0  CL 106  CO2 28  GLUCOSE 98  BUN 15  CREATININE 0.69  CALCIUM 9.3    MG --  PHOS --   Cardiac Enzymes: No results found for this basename: CKTOTAL:3,CKMB:3,CKMBINDEX:3,TROPONINI:3 in the last 72 hours CBC:  Lab 04/02/11 0615 04/01/11 0500  WBC 7.6 6.5  NEUTROABS -- --  HGB 13.7 13.9  HCT 42.0 42.5  MCV 93.1 95.3  PLT 227 247   PROTIME:  Basename 03/31/11 1341  LABPROT 18.5*  INR 1.51*      ASSESSMENT AND PLAN: Patient admitted for catheterization with a prior history of stenting of her right coronary artery with multiple complications including in-stent restenosis and acute stent thrombosis. She has factor V Leiden deficiency and recurrent pulmonary embolism she is on heparin bridging. We will continue that.  Orders are written for catheterization  Signed, Sherryl Manges MD  04/02/2011

## 2011-04-03 NOTE — CV Procedure (Signed)
   Cardiac Catheterization Procedure Note  Name: [name] MRN: [id number] DOB: June 30, 1950  Procedure: Left Heart Cath, Selective Coronary Angiography, LV angiography  Indication: 61 year old woman with complex medical history. She has had multiple PCI procedures in the right coronary artery and has a history of stent thrombosis with recurrent myocardial infarction. She has overlapping layers of stent. She's had increasing chest pain of late. She also has a history of factor V Leiden and venous thromboembolism and as she is maintained on long-term anticoagulation. She was brought in for Lovenox bridging and diagnostic catheterization.   Procedural Details: The right wrist was prepped, draped, and anesthetized with 1% lidocaine. Using the modified Seldinger technique, a 5 French sheath was introduced into the right radial artery. 3 mg of verapamil was administered through the sheath, weight-based unfractionated heparin was administered intravenously. Standard Judkins catheters were used for selective coronary angiography and left ventriculography. Catheter exchanges were performed over an exchange length guidewire. There were no immediate procedural complications. A TR band was used for radial hemostasis at the completion of the procedure.  The patient was transferred to the post catheterization recovery area for further monitoring.  Procedural Findings: Hemodynamics: AO 132[address] LV 133/22  Coronary angiography: Coronary dominance: right  Left mainstem: Widely patent with no obstructive disease. There is minimal plaquing in the distal left main stem but no significant stenosis is present.  Left anterior descending (LAD): The LAD is widely patent to the left ventricular apex. There is minor irregularity in the proximal and mid vessel. There is a small first diagonal branch with no significant stenosis. The septal perforators are widely patent.  Left circumflex (LCx): The left circumflex is  patent. There is a moderate caliber obtuse marginal branch with a high origin with mild irregularity and up to 50% stenosis. The AV groove circumflex has 30-40% stenosis in the midportion. The second obtuse marginal branch has no stenosis.  Right coronary artery (RCA): The right coronary artery is a large, dominant vessel. The overlapping stent layers in the proximal vessel are patent. There is mild in-stent restenosis at the ostium of the vessel of 30%. There is no significant pressure dampening with the catheter into the proximal right coronary artery. In the distal portion of the stented segment there is another smooth area of in-stent restenosis up to 30%. The remaining portions of the right coronary artery are now widely patent. The PDA branch and posterolateral branches are widely patent with no significant stenosis.  Left ventriculography: Overall LV function is preserved. The estimated left ventricular ejection fraction is 55-60%. There is focal akinesis of the basal inferior wall.  Final Conclusions:   1. Patent stented segment in the right coronary artery with mild in-stent restenosis 2. Minor nonobstructive plaque in the left main, LAD, and left circumflex. 3. Mild left ventricular segmental contraction abnormality with overall preserved LV function  Recommendations: The patient's cath study is reassuring. She will be continued on ticagrelor 90 mg twice daily. Coumadin will be restarted tonight with a Lovenox bridge resumed this afternoon. She will be discharged from the hospital when her INR is therapeutic. We will not treat her with aspirin since she will require long-term Coumadin.  Tonny Bollman 04/03/2011, 8:43 AM

## 2011-04-04 LAB — PROTIME-INR: INR: 1.13 (ref 0.00–1.49)

## 2011-04-04 MED ORDER — HYDROCORTISONE 1 % EX CREA
1.0000 "application " | TOPICAL_CREAM | Freq: Three times a day (TID) | CUTANEOUS | Status: DC | PRN
  Administered 2011-04-04: 1 via TOPICAL
  Filled 2011-04-04: qty 28

## 2011-04-04 MED ORDER — WARFARIN SODIUM 10 MG PO TABS
10.0000 mg | ORAL_TABLET | Freq: Once | ORAL | Status: AC
  Administered 2011-04-04: 10 mg via ORAL
  Filled 2011-04-04: qty 1

## 2011-04-04 NOTE — Progress Notes (Signed)
   CARE MANAGEMENT NOTE 04/04/2011  Patient:  [name], [name]   Account Number:  [id number]  Date Initiated:  04/04/2011  Documentation initiated by:  GRAVES-BIGELOW,Klaus Casteneda  Subjective/Objective Assessment:   Pt admitted with cp. S/p LHC. Plan for home and states she needs medication assistance. Pt does not have medication coverage part D.  Gets brilinta through co. Pt states she lives alone and is on disability. Does not have PCP.     Action/Plan:   CM did call the Health Dept to see if they can assist with medications. CM left vm with Blima Singer and she will get back in contact with me. Pt will not qualify for zz fund/ 3 day supply of meds.   Anticipated DC Date:  04/07/2011   Anticipated DC Plan:  HOME/SELF CARE      DC Planning Services  CM consult      Choice offered to / List presented to:             Status of service:  In process, will continue to follow Medicare Important Message given?   (If response is "NO", the following Medicare IM given date fields will be blank) Date Medicare IM given:   Date Additional Medicare IM given:    Discharge Disposition:    Per UR Regulation:    If discussed at Long Length of Stay Meetings, dates discussed:    Comments:  04-04-11 1117 Tomi Bamberger, RN,BSN [phone] CM  told pt to go back to social services and speak to financial counselor for medicaid benefits if eligible. Will continue to monitor.

## 2011-04-04 NOTE — Progress Notes (Signed)
Patient ID: [name], female   DOB: 04-07-1950, 61 y.o.   MRN: [id number]   SUBJECTIVE:  Patient is feeling well. The cath site in her right wrist is nicely healing. The cath that revealed that she did not need a PCI. She is receiving Lovenox and Coumadin. She can be discharged home when her INR is therapeutic. The plans are carefully outlined in Dr. Earmon Phoenix cath note from yesterday.   Filed Vitals:   04/03/11 2217 04/04/11 0600 04/04/11 0739 04/04/11 0925  BP: 129/75 115/65  110/58  Pulse:  69  89  Temp:  97.8 F (36.6 C)    TempSrc:  Oral    Resp:  20    Height:      Weight:      SpO2:  94% 94%     Intake/Output Summary (Last 24 hours) at 04/04/11 1020 Last data filed at 04/04/11 0900  Gross per 24 hour  Intake    240 ml  Output      0 ml  Net    240 ml    LABS: Basic Metabolic Panel:  Basename 04/03/11 1114  NA --  K --  CL --  CO2 --  GLUCOSE --  BUN --  CREATININE 0.69  CALCIUM --  MG --  PHOS --   Liver Function Tests: No results found for this basename: AST:2,ALT:2,ALKPHOS:2,BILITOT:2,PROT:2,ALBUMIN:2 in the last 72 hours No results found for this basename: LIPASE:2,AMYLASE:2 in the last 72 hours CBC:  Basename 04/03/11 1114 04/03/11 0710  WBC 6.8 8.0  NEUTROABS -- --  HGB 13.8 14.1  HCT 42.7 42.8  MCV 93.6 94.1  PLT 237 239   Cardiac Enzymes: No results found for this basename: CKTOTAL:3,CKMB:3,CKMBINDEX:3,TROPONINI:3 in the last 72 hours BNP: No components found with this basename: POCBNP:3 D-Dimer: No results found for this basename: DDIMER:2 in the last 72 hours Hemoglobin A1C: No results found for this basename: HGBA1C in the last 72 hours Fasting Lipid Panel: No results found for this basename: CHOL,HDL,LDLCALC,TRIG,CHOLHDL,LDLDIRECT in the last 72 hours Thyroid Function Tests: No results found for this basename: TSH,T4TOTAL,FREET3,T3FREE,THYROIDAB in the last 72 hours  RADIOLOGY: No results found.  PHYSICAL EXAM    Patient is  oriented to person time and place. Affect is normal. There is no jugulovenous distention. Lungs are clear. Respiratory effort is nonlabored. Cardiac exam reveals S1 and S2. There no clicks or significant murmurs. The abdomen is soft is no peripheral edema. The right radial cath site is quite stable.   TELEMETRY: I have personally reviewed to telemetry. There is normal sinus rhythm.   ASSESSMENT AND PLAN:   CORONARY ARTERY DISEASE    Patient is status post catheterization. Plan is for in-hospital care for Lovenox treatment to bridge until INR is therapeutic. Patient is high risk for venous thrombosis and her Coumadin needs to be full dose before going home.   Long term current use of anticoagulant   Pharmacy is managing Lovenox and Coumadin.    Willa Rough 04/04/2011 10:20 AM

## 2011-04-04 NOTE — Progress Notes (Signed)
ANTICOAGULATION CONSULT NOTE - Follow Up Consult  Pharmacy Consult for coumdain/lovenox Indication: Hx Factor V Leiden deficiency and new ACS sx, s/p cath 04/03/11  Allergies  Allergen Reactions  . Minocycline Hcl     REACTION: Dizzy  . Prednisone     REACTION: feels like throat swelling  . Varenicline Tartrate     REACTION: Dizzy  . Zocor (Simvastatin - High Dose) Other (See Comments)    myalgia   Admit Complaint: 61 y.o.  female  with  Factor V Leiden, hx PE, hx DVT. admitted 03/31/2011 .   Pharmacy consulted to bridge patient with heparin for cardiac cath.  Home warfarin dose: Warfarin 5 mg: Take 1 tablet on Sunday and 1.5 tablet the rest of the week  Overnight Events: 04/04/2011 patient s/p cath 3/25 no events.  Assessment: Anticoagulation: Factor V Leiden, History PE: Lovenox 90 q12h and warfarin s/p one dose 1.5x home dose INR unchanged  Cardiovascular: hyperlipidemia, CAD: ASA, imdur, lisinopril, metoprolol, ticagrelor, fish oil, BP and HR at goal   Endocrinology: SLE, Hypothyroid: levothryoxine 125  Pulmonary: Albuterol, Advair, Spiriva,  Neurology: Imipramine, trazodone, statin,  PTA Medication Issues: Case management working to identify funding source for home medications, patient had acute stent thrombosis on clopidogrel. Home Meds Not Ordered: all home medication ordered. Best Practices: DVT Prophylaxis: Lovenox  Goal of Therapy:  INR 2-3 prior to dischage   Plan:  1. Continue Lovenox 2. Coumadin 10 mg po x 1 dose today 3. Daily INR    Patient Measurements: Height: 5\' 6"  (167.6 cm) Weight: 212 lb 14.4 oz (96.571 kg) IBW/kg (Calculated) : 59.3  Heparin Dosing Weight:   Vital Signs: Temp: 97.8 F (36.6 C) (03/26 0600) Temp src: Oral (03/26 0600) BP: 110/58 mmHg (03/26 0925) Pulse Rate: 89  (03/26 0925)  Labs:  Basename 04/04/11 0528 04/03/11 1114 04/03/11 0710 04/02/11 0615  HGB -- 13.8 14.1 --  HCT -- 42.7 42.8 42.0  PLT -- 237 239 227  APTT --  -- -- --  LABPROT 14.7 -- -- --  INR 1.13 -- -- --  HEPARINUNFRC -- -- 0.53 0.67  CREATININE -- 0.69 -- --  CKTOTAL -- -- -- --  CKMB -- -- -- --  TROPONINI -- -- -- --   Estimated Creatinine Clearance: 87.6 ml/min (by C-G formula based on Cr of 0.69).  Lovenia Kim Pharm.D., BCPS Clinical Pharmacist 04/04/2011 1:19 PM Pager: [phone] Phone: [phone]

## 2011-04-05 LAB — PROTIME-INR: Prothrombin Time: 17.1 seconds — ABNORMAL HIGH (ref 11.6–15.2)

## 2011-04-05 MED ORDER — WARFARIN SODIUM 10 MG PO TABS
10.0000 mg | ORAL_TABLET | Freq: Once | ORAL | Status: AC
  Administered 2011-04-05: 10 mg via ORAL
  Filled 2011-04-05: qty 1

## 2011-04-05 NOTE — Progress Notes (Signed)
Patient ID: [name], female   DOB: [birth year], 61 y.o.   MRN: [id number] Patient ID: [name], female   DOB: 06-09-1950, 61 y.o.   MRN: [id number]   SUBJECTIVE:  Patient is feeling well. The cath site in her right wrist is nicely healing. The cath that revealed that she did not need a PCI. She is receiving Lovenox and Coumadin. She can be discharged home when her INR is therapeutic. The plans are carefully outlined in Dr. Earmon Phoenix cath note from yesterday.She has factor V deficiency with past history of multiple blood clotting episodes  Filed Vitals:   04/04/11 2000 04/04/11 2100 04/05/11 0500 04/05/11 0742  BP: 115/60  119/70   Pulse: 78  79   Temp: 98.1 F (36.7 C)  98.4 F (36.9 C)   TempSrc: Oral  Oral   Resp: 20  20   Height:      Weight:      SpO2: 96% 96% 94% 93%    Intake/Output Summary (Last 24 hours) at 04/05/11 0842 Last data filed at 04/05/11 0824  Gross per 24 hour  Intake    960 ml  Output      0 ml  Net    960 ml    LABS: Basic Metabolic Panel:  Basename 04/03/11 1114  NA --  K --  CL --  CO2 --  GLUCOSE --  BUN --  CREATININE 0.69  CALCIUM --  MG --  PHOS --   Liver Function Tests: No results found for this basename: AST:2,ALT:2,ALKPHOS:2,BILITOT:2,PROT:2,ALBUMIN:2 in the last 72 hours No results found for this basename: LIPASE:2,AMYLASE:2 in the last 72 hours CBC:  Basename 04/03/11 1114 04/03/11 0710  WBC 6.8 8.0  NEUTROABS -- --  HGB 13.8 14.1  HCT 42.7 42.8  MCV 93.6 94.1  PLT 237 239   Cardiac Enzymes: No results found for this basename: CKTOTAL:3,CKMB:3,CKMBINDEX:3,TROPONINI:3 in the last 72 hours BNP: No components found with this basename: POCBNP:3 D-Dimer: No results found for this basename: DDIMER:2 in the last 72 hours Hemoglobin A1C: No results found for this basename: HGBA1C in the last 72 hours Fasting Lipid Panel: No results found for this basename: CHOL,HDL,LDLCALC,TRIG,CHOLHDL,LDLDIRECT in the last 72  hours Thyroid Function Tests: No results found for this basename: TSH,T4TOTAL,FREET3,T3FREE,THYROIDAB in the last 72 hours  RADIOLOGY: No results found.  PHYSICAL EXAM    Patient is oriented to person time and place. Affect is normal. There is no jugulovenous distention. Lungs are clear. Respiratory effort is nonlabored. Cardiac exam reveals S1 and S2. There no clicks or significant murmurs. The abdomen is soft is no peripheral edema. The right radial cath site is quite stable.   TELEMETRY: I have personally reviewed to telemetry. There is normal sinus rhythm.   ASSESSMENT AND PLAN:   CORONARY ARTERY DISEASE    Patient is status post catheterization. Plan is for in-hospital care for Lovenox treatment to bridge until INR is therapeutic. Patient is high risk for venous thrombosis and her Coumadin needs to be full dose before going home.   Long term current use of anticoagulant   Pharmacy is managing Lovenox and Coumadin.  INR today 1.37.  Continue present Meds.    Cassell Clement 04/05/2011 8:42 AM

## 2011-04-05 NOTE — Progress Notes (Signed)
ANTICOAGULATION CONSULT NOTE - Follow Up Consult  Pharmacy Consult for coumdain/lovenox Indication: Hx Factor V Leiden deficiency, H/o PE/DVT and new ACS sx, s/p cath 04/03/11  Allergies  Allergen Reactions  . Minocycline Hcl     REACTION: Dizzy  . Prednisone     REACTION: feels like throat swelling  . Varenicline Tartrate     REACTION: Dizzy  . Zocor (Simvastatin - High Dose) Other (See Comments)    myalgia   Admit Complaint: 61 y.o.  female  with  Factor V Leiden, hx PE/DVT. Admitted 03/31/2011 .  S/P cardiac cath on 04/03/11, no PCI needed. No bleeding reported. Bridging with Lovenox 90mg  sq q12h to therapeutic INR on Coumadin prior to discharge as patient has high risk of venous thrombosis.   Home warfarin dose: Warfarin 5 mg: Take 1 tablet on Sunday and 1.5 tablet the rest of the week  Goal of Therapy:  INR 2-3 prior to dischage   Plan:  1. Continue Lovenox 90 mg SQ q12hr. 2. Coumadin 10 mg po x 1 dose today 3. Daily INR   Patient Measurements: Height: 5\' 6"  (167.6 cm) Weight: 212 lb 14.4 oz (96.571 kg) IBW/kg (Calculated) : 59.3    Vital Signs: Temp: 98.4 F (36.9 C) (03/27 0500) Temp src: Oral (03/27 0500) BP: 119/70 mmHg (03/27 0500) Pulse Rate: 79  (03/27 0500)  Labs:  Basename 04/05/11 0615 04/04/11 0528 04/03/11 1114 04/03/11 0710  HGB -- -- 13.8 14.1  HCT -- -- 42.7 42.8  PLT -- -- 237 239  APTT -- -- -- --  LABPROT 17.1* 14.7 -- --  INR 1.37 1.13 -- --  HEPARINUNFRC -- -- -- 0.53  CREATININE -- -- 0.69 --  CKTOTAL -- -- -- --  CKMB -- -- -- --  TROPONINI -- -- -- --   Estimated Creatinine Clearance: 87.6 ml/min (by C-G formula based on Cr of 0.69).  Noah Delaine, RPh Clinical Pharmacist [facility] 04/05/2011 10:24

## 2011-04-06 LAB — PROTIME-INR: Prothrombin Time: 21.9 seconds — ABNORMAL HIGH (ref 11.6–15.2)

## 2011-04-06 MED ORDER — CALCIUM CARBONATE ANTACID 500 MG PO CHEW
1.0000 | CHEWABLE_TABLET | Freq: Two times a day (BID) | ORAL | Status: DC | PRN
  Administered 2011-04-06 – 2011-04-07 (×2): 400 mg via ORAL
  Filled 2011-04-06 (×4): qty 2

## 2011-04-06 MED ORDER — FAMOTIDINE 20 MG PO TABS
20.0000 mg | ORAL_TABLET | Freq: Every day | ORAL | Status: DC | PRN
  Administered 2011-04-06 – 2011-04-09 (×3): 20 mg via ORAL
  Filled 2011-04-06 (×3): qty 1

## 2011-04-06 MED ORDER — WARFARIN SODIUM 5 MG PO TABS
5.0000 mg | ORAL_TABLET | Freq: Once | ORAL | Status: AC
  Administered 2011-04-06: 5 mg via ORAL
  Filled 2011-04-06: qty 1

## 2011-04-06 NOTE — Progress Notes (Signed)
ANTICOAGULATION CONSULT NOTE - Follow Up Consult  Pharmacy Consult for Lovenox/Coumadin Indication: Hx Factor V Leiden deficiency, H/o PE/DVT  Allergies  Allergen Reactions  . Minocycline Hcl     REACTION: Dizzy  . Prednisone     REACTION: feels like throat swelling  . Varenicline Tartrate     REACTION: Dizzy  . Zocor (Simvastatin - High Dose) Other (See Comments)    myalgia   Vital Signs: Temp: 98.6 F (37 C) (03/28 0500) Temp src: Oral (03/28 0500) BP: 112/67 mmHg (03/28 0500) Pulse Rate: 73  (03/28 0500)  Labs:  Alvira Philips 04/06/11 0555 04/05/11 0615 04/04/11 0528 04/03/11 1114  HGB -- -- -- 13.8  HCT -- -- -- 42.7  PLT -- -- -- 237  APTT -- -- -- --  LABPROT 21.9* 17.1* 14.7 --  INR 1.88* 1.37 1.13 --  HEPARINUNFRC -- -- -- --  CREATININE -- -- -- 0.69  CKTOTAL -- -- -- --  CKMB -- -- -- --  TROPONINI -- -- -- --   Estimated Creatinine Clearance: 87.6 ml/min (by C-G formula based on Cr of 0.69).  Assessment: 60yof continues on coumadin with lovenox bridge. INR is still below goal but approaching therapeutic after 2 boosted doses. Will attempt to resume home dose today. No bleeding noted per chart notes.  Goal of Therapy:  INR 2-3   Plan:  1) Coumadin 5mg  x 1 2) Continue lovenox 90mg  q12 3) Follow up INR in AM  Fredrik Rigger 04/06/2011,8:26 AM

## 2011-04-06 NOTE — Progress Notes (Signed)
Called to restart another iv for pt;  Pt requesting iv Not be restarted, as she says she's "supposed to go home tomorrow;  The  Only reason I'm here is because my INR is low; I'm not getting medicine through it;"  RN aware and is calling the MD to get order to leave iv out if possible;   Barkley Bruns RN IV Team

## 2011-04-06 NOTE — Progress Notes (Addendum)
   CARE MANAGEMENT NOTE 04/06/2011  Patient:  PURITY, IRMEN   Account Number:  [id number]  Date Initiated:  04/04/2011  Documentation initiated by:  GRAVES-BIGELOW,Valeta Paz  Subjective/Objective Assessment:   Pt admitted with cp. S/p LHC. Plan for home and states she needs medication assistance. Pt does not have medication coverage part D.  Gets brilinta through co. Pt states she lives alone and is on disability. Does not have PCP.     Action/Plan:   CM did call the Health Dept to see if they can assist with medications. CM left vm with Blima Singer and she will get back in contact with me. Pt will not qualify for zz fund/ 3 day supply of meds.   Anticipated DC Date:  04/07/2011   Anticipated DC Plan:  HOME/SELF CARE      DC Planning Services  CM consult      Choice offered to / List presented to:             Status of service:  Completed, signed off Medicare Important Message given?   (If response is "NO", the following Medicare IM given date fields will be blank) Date Medicare IM given:   Date Additional Medicare IM given:    Discharge Disposition:  HOME/SELF CARE  Per UR Regulation:    If discussed at Long Length of Stay Meetings, dates discussed:    Comments:  04-06-11 1009 Tomi Bamberger, RN,BSN [phone] CM spoke to Blima Singer at [facility] and she will assist pt with medications once she is d/c. [facility] will have to contact pt and then hopefully help can be provided. Pt is not eligible for 3 day supply via zz fund. CM did get a resource for pt to call in [place] and they can assist with medication for one time. Pt can contact [employer] at [phone] on M,T, W 8am -12pm. The food bank is open W,TH 9:30 am -12:00 pm. Please make sure that pt can be d/c home on all generics if possible. CM will continue to monitor.  04-04-11 1117 Tomi Bamberger, RN,BSN [phone] CM  told pt to go back to social services and speak to financial counselor for medicaid  benefits if eligible. Will continue to monitor.

## 2011-04-06 NOTE — Progress Notes (Signed)
Patient ID: [name], female   DOB: 08/25/1950, 61 y.o.   MRN: [id number] Patient ID: [name], female   DOB: 1950/02/13, 61 y.o.   MRN: [id number] Patient ID: [name], female   DOB: 01-10-1950, 61 y.o.   MRN: [id number]   SUBJECTIVE:  Patient is feeling well. The cath site in her right wrist is nicely healing. The cath that revealed that she did not need a PCI. She is receiving Lovenox and Coumadin. She can be discharged home when her INR is therapeutic. The plans are carefully outlined in Dr. Earmon Phoenix cath note from yesterday.She has factor V deficiency with past history of multiple blood clotting episodes.  The patient walked in hall yesterday without chest pain or dyspnea.  Filed Vitals:   04/05/11 0742 04/05/11 1402 04/05/11 2100 04/06/11 0500  BP:  106/63 125/67 112/67  Pulse:  65 75 73  Temp:  97.8 F (36.6 C) 98.4 F (36.9 C) 98.6 F (37 C)  TempSrc:   Oral Oral  Resp:  20 18 18   Height:      Weight:      SpO2: 93% 96% 94% 94%    Intake/Output Summary (Last 24 hours) at 04/06/11 0865 Last data filed at 04/06/11 7846  Gross per 24 hour  Intake   1200 ml  Output      0 ml  Net   1200 ml    LABS: Basic Metabolic Panel:  Basename 04/03/11 1114  NA --  K --  CL --  CO2 --  GLUCOSE --  BUN --  CREATININE 0.69  CALCIUM --  MG --  PHOS --   Liver Function Tests: No results found for this basename: AST:2,ALT:2,ALKPHOS:2,BILITOT:2,PROT:2,ALBUMIN:2 in the last 72 hours No results found for this basename: LIPASE:2,AMYLASE:2 in the last 72 hours CBC:  Basename 04/03/11 1114  WBC 6.8  NEUTROABS --  HGB 13.8  HCT 42.7  MCV 93.6  PLT 237   Cardiac Enzymes: No results found for this basename: CKTOTAL:3,CKMB:3,CKMBINDEX:3,TROPONINI:3 in the last 72 hours BNP: No components found with this basename: POCBNP:3 D-Dimer: No results found for this basename: DDIMER:2 in the last 72 hours Hemoglobin A1C: No results found for this basename: HGBA1C in the  last 72 hours Fasting Lipid Panel: No results found for this basename: CHOL,HDL,LDLCALC,TRIG,CHOLHDL,LDLDIRECT in the last 72 hours Thyroid Function Tests: No results found for this basename: TSH,T4TOTAL,FREET3,T3FREE,THYROIDAB in the last 72 hours  RADIOLOGY: No results found.  PHYSICAL EXAM    Patient is oriented to person time and place. Affect is normal. There is no jugulovenous distention. Lungs are clear. Respiratory effort is nonlabored. Cardiac exam reveals S1 and S2. There no clicks or significant murmurs. The abdomen is soft is no peripheral edema. The right radial cath site is quite stable.   TELEMETRY: I have personally reviewed to telemetry. There is normal sinus rhythm.   ASSESSMENT AND PLAN:   CORONARY ARTERY DISEASE    Patient is status post catheterization. Plan is for in-hospital care for Lovenox treatment to bridge until INR is therapeutic. Patient is high risk for venous thrombosis and her Coumadin needs to be full dose before going home.   Long term current use of anticoagulant   Pharmacy is managing Lovenox and Coumadin. INR today is 1.88   Plan: By tomorrow her INR should be above 2.0 and she will be able to go home    Cassell Clement 04/06/2011 8:21 AM

## 2011-04-06 NOTE — Progress Notes (Signed)
Pt refusing IV access at this time. PA, D. Dunn made aware.  [name]

## 2011-04-06 NOTE — Discharge Instructions (Addendum)
[name] at [facility]  [phone]   will assist pt with medications once she is d/c. [facility] will have to contact pt and then hopefully help can be provided.  Resource for pt to call in [place] and they can assist with medication for one time. Pt can contact [employer] at [phone] on Monday, Tuesday, Wednesday 8am -12pm. The food bank is open Wednesday, Thursday 9:30 am -12:00 pm.

## 2011-04-07 DIAGNOSIS — Z7901 Long term (current) use of anticoagulants: Secondary | ICD-10-CM

## 2011-04-07 DIAGNOSIS — D6859 Other primary thrombophilia: Secondary | ICD-10-CM

## 2011-04-07 LAB — PROTIME-INR: INR: 1.81 — ABNORMAL HIGH (ref 0.00–1.49)

## 2011-04-07 MED ORDER — WARFARIN SODIUM 10 MG PO TABS
10.0000 mg | ORAL_TABLET | Freq: Once | ORAL | Status: AC
  Administered 2011-04-07: 10 mg via ORAL
  Filled 2011-04-07: qty 1

## 2011-04-07 MED ORDER — WARFARIN SODIUM 7.5 MG PO TABS
7.5000 mg | ORAL_TABLET | Freq: Once | ORAL | Status: DC
  Filled 2011-04-07: qty 1

## 2011-04-07 NOTE — Consult Note (Signed)
Pt smokes 1/2 ppd and is in contemplation stage about quitting. Doesn't seem too ready or willing to quit any time soon. Gave pt educ/contact info for future interest.

## 2011-04-07 NOTE — Progress Notes (Addendum)
ANTICOAGULATION CONSULT NOTE - Follow Up Consult  Pharmacy Consult for Lovenox/Coumadin Indication: Hx Factor V Leiden deficiency, H/o PE/DVT  Allergies  Allergen Reactions  . Minocycline Hcl     REACTION: Dizzy  . Prednisone     REACTION: feels like throat swelling  . Varenicline Tartrate     REACTION: Dizzy  . Zocor (Simvastatin - High Dose) Other (See Comments)    myalgia   Vital Signs: Temp: 97.9 F (36.6 C) (03/29 1348) Temp src: Oral (03/29 1348) BP: 106/63 mmHg (03/29 1348) Pulse Rate: 83  (03/29 1348)  Labs:  Basename 04/07/11 0630 04/06/11 0555 04/05/11 0615  HGB -- -- --  HCT -- -- --  PLT -- -- --  APTT -- -- --  LABPROT 21.3* 21.9* 17.1*  INR 1.81* 1.88* 1.37  HEPARINUNFRC -- -- --  CREATININE -- -- --  CKTOTAL -- -- --  CKMB -- -- --  TROPONINI -- -- --   Estimated Creatinine Clearance: 87.6 ml/min (by C-G formula based on Cr of 0.69).  Assessment: 60yof continues on coumadin with lovenox bridge. INR is still below goal but approaching therapeutic after increased dose.  Home regimen initiated yesterday.  No change in her INR today - will give slightly higher dose today to get her within therapeutic range.  No bleeding noted.  Goal of Therapy:  INR 2-3   Plan:  1) Coumadin 7.5mg  x 1  (see addendum below) 2) Continue lovenox 90mg  q12 3) Follow up INR in AM  Nadara Mustard, PharmD., MS Clinical Pharmacist Pager:  [phone] 04/07/2011,2:10 PM   Dr. [place] asked me to speak with patient as patient upset that pharmacist only gave her 5mg  coumadin yesterday and today's INR decreased slightly from 1.88 to 1.81 today. I discussed with this patient her Coumadin dosing and  INR results since admission. Coumadin 10mg  daily x 3 days,  Yesterday INR increased from 1.37 to 1.88, thus coumadin dose was lowered yesterday. As patient's home dose is 7.5mg  daily except 5mg  qSunday, INR down to 1.81 today,  factor V leiden deficiency and no bleeding reported, I will  change coumadin dose to 10mg  today. Follow up INR in AM.  Noah Delaine, RPh Clinical Pharmacist 04/07/2011 16:02

## 2011-04-07 NOTE — Progress Notes (Signed)
   TELEMETRY: Reviewed telemetry pt in NSR: Filed Vitals:   04/07/11 0607 04/07/11 0800 04/07/11 1002 04/07/11 1348  BP: 120/72  119/64 106/63  Pulse:   85 83  Temp:    97.9 F (36.6 C)  TempSrc:    Oral  Resp:    18  Height:      Weight:      SpO2:  97%  95%    Intake/Output Summary (Last 24 hours) at 04/07/11 1444 Last data filed at 04/07/11 1300  Gross per 24 hour  Intake   1320 ml  Output      4 ml  Net   1316 ml    SUBJECTIVE Patient feels fine. No chest pain. Very upset about subtherapeutic INR today. States she got too low a dose of coumadin yesterday. INR dropped from 1.88 to 1.81 today.  LABS: Basic Metabolic Panel: No results found for this basename: NA:2,K:2,CL:2,CO2:2,GLUCOSE:2,BUN:2,CREATININE:2,CALCIUM:2,MG:2,PHOS:2 in the last 72 hours Liver Function Tests: No results found for this basename: AST:2,ALT:2,ALKPHOS:2,BILITOT:2,PROT:2,ALBUMIN:2 in the last 72 hours No results found for this basename: LIPASE:2,AMYLASE:2 in the last 72 hours CBC: No results found for this basename: WBC:2,NEUTROABS:2,HGB:2,HCT:2,MCV:2,PLT:2 in the last 72 hours Cardiac Enzymes: No results found for this basename: CKTOTAL:3,CKMB:3,CKMBINDEX:3,TROPONINI:3 in the last 72 hours BNP: No components found with this basename: POCBNP:3 D-Dimer: No results found for this basename: DDIMER:2 in the last 72 hours Hemoglobin A1C: No results found for this basename: HGBA1C in the last 72 hours Fasting Lipid Panel: No results found for this basename: CHOL,HDL,LDLCALC,TRIG,CHOLHDL,LDLDIRECT in the last 72 hours Thyroid Function Tests: No results found for this basename: TSH,T4TOTAL,FREET3,T3FREE,THYROIDAB in the last 72 hours Anemia Panel: No results found for this basename: VITAMINB12,FOLATE,FERRITIN,TIBC,IRON,RETICCTPCT in the last 72 hours  Radiology/Studies:  No results found.  PHYSICAL EXAM General: Well developed, well nourished, in no acute distress. Head: normal Neck: Negative  for carotid bruits. JVD not elevated. Lungs: Clear bilaterally to auscultation without wheezes, rales, or rhonchi. Breathing is unlabored. Heart: RRR S1 S2 without murmurs, rubs, or gallops.  Abdomen: Soft, non-tender, non-distended with normoactive bowel sounds. No hepatomegaly. No rebound/guarding. No obvious abdominal masses. Extremities: No clubbing, cyanosis or edema.  Distal pedal pulses are 2+ and equal bilaterally. Mild bruising at radial cath site but no hematoma. Neuro: Alert and oriented X 3. Moves all extremities spontaneously. Psych:  Responds to questions appropriately with a normal affect.  ASSESSMENT AND PLAN: 1. Chest pain. Cardiac cath showed no obstructive disease.Patient has a history of multiple stents. Plan is to continue Brilinta and Coumadin. No ASA. Target INR of 2.0-2.5. 2. Factor V leiden deficiency. With history of venous thrombosis.  Plan: I reviewed recommendation for her to stay here on Lovenox until INR therapeutic. Will review with pharmacy. Patient very upset and threatens to go home anyway. She understands the risk if she is not covered and cannot afford Lovenox at home.  Active Problems:  CORONARY ARTERY DISEASE  Long term current use of anticoagulant    Signed, Oria Klimas [place] MD,FACC 04/07/2011 2:51 PM

## 2011-04-08 MED ORDER — WARFARIN SODIUM 7.5 MG PO TABS
7.5000 mg | ORAL_TABLET | Freq: Once | ORAL | Status: AC
  Administered 2011-04-08: 7.5 mg via ORAL
  Filled 2011-04-08: qty 1

## 2011-04-08 NOTE — Progress Notes (Signed)
ANTICOAGULATION CONSULT NOTE - Follow Up Consult  Pharmacy Consult for Lovenox/Coumadin Indication:Hx Factor V Leiden deficiency, H/o PE/DVT    Allergies  Allergen Reactions  . Minocycline Hcl     REACTION: Dizzy  . Prednisone     REACTION: feels like throat swelling  . Varenicline Tartrate     REACTION: Dizzy  . Zocor (Simvastatin - High Dose) Other (See Comments)    myalgia   Vital Signs: Temp: 98 F (36.7 C) (03/30 0600) BP: 108/71 mmHg (03/30 0600) Pulse Rate: 92  (03/30 0600)  Labs:  Basename 04/08/11 0500 04/07/11 0630 04/06/11 0555  HGB -- -- --  HCT -- -- --  PLT -- -- --  APTT -- -- --  LABPROT 22.7* 21.3* 21.9*  INR 1.96* 1.81* 1.88*  HEPARINUNFRC -- -- --  CREATININE -- -- --  CKTOTAL -- -- --  CKMB -- -- --  TROPONINI -- -- --   Estimated Creatinine Clearance: 87.6 ml/min (by C-G formula based on Cr of 0.69).  Assessment: 60yof continues on coumadin with lovenox bridge. INR is slightly below goal but increased from yesterday after higher 10mg  dose given. No bleeding. Will resume home dose (7.5mg  daily except 5mg  on Sunday) today.  Goal of Therapy:  INR 2-3   Plan:  1) Coumadin 7.5mg  x 1 2) Continue lovenox 90mg  q12 3) Follow up INR in AM  Fredrik Rigger 04/08/2011,8:46 AM

## 2011-04-08 NOTE — Progress Notes (Signed)
Patient ID: [name], female   DOB: 1950-05-01, 61 y.o.   MRN: [id number]   Patient Name: [name] Date of Encounter: 04/08/2011    SUBJECTIVE  Willing to stay until INR>2. 1.96 today. No complaints.  CURRENT MEDS    . atorvastatin  40 mg Oral q1800  . enoxaparin (LOVENOX) injection  1 mg/kg Subcutaneous Q12H  . Fluticasone-Salmeterol  1 puff Inhalation Q12H  . imipramine  25 mg Oral QHS  . isosorbide dinitrate  20 mg Oral BID  . levothyroxine  125 mcg Oral Daily  . lisinopril  2.5 mg Oral Daily  . metoprolol tartrate  25 mg Oral BID  . omega-3 acid ethyl esters  1 g Oral Daily  . Ticagrelor  90 mg Oral BID  . tiotropium  18 mcg Inhalation Daily  . warfarin  10 mg Oral ONCE-1800  . warfarin  7.5 mg Oral ONCE-1800  . Warfarin - Pharmacist Dosing Inpatient   Does not apply q1800  . DISCONTD: warfarin  7.5 mg Oral ONCE-1800    OBJECTIVE  Filed Vitals:   04/07/11 1002 04/07/11 1348 04/07/11 2200 04/08/11 0600  BP: 119/64 106/63 116/56 108/71  Pulse: 85 83 88 92  Temp:  97.9 F (36.6 C) 98.5 F (36.9 C) 98 F (36.7 C)  TempSrc:  Oral    Resp:  18 14 14   Height:      Weight:      SpO2:  95% 94% 93%    Intake/Output Summary (Last 24 hours) at 04/08/11 0943 Last data filed at 04/07/11 1742  Gross per 24 hour  Intake    760 ml  Output      3 ml  Net    757 ml   Filed Weights   03/31/11 1338 04/03/11 1400  Weight: 201 lb 14.4 oz (91.581 kg) 212 lb 14.4 oz (96.571 kg)    PHYSICAL EXAM  General: Pleasant, NAD. Neuro: Alert and oriented X 3. Moves all extremities spontaneously. Psych: Normal affect. HEENT:  Normal  Neck: Supple without bruits or JVD. Lungs:  Resp regular and unlabored, CTA. Heart: RRR no s3, s4, or murmurs. Abdomen: Soft, non-tender, non-distended, BS + x 4.  Extremities: No clubbing, cyanosis or edema. DP/PT/Radials 2+ and equal bilaterally.  Accessory Clinical Findings  CBC No results found for this basename:  WBC:2,NEUTROABS:2,HGB:2,HCT:2,MCV:2,PLT:2 in the last 72 hours Basic Metabolic Panel No results found for this basename: NA:2,K:2,CL:2,CO2:2,GLUCOSE:2,BUN:2,CREATININE:2,CALCIUM:2,MG:2,PHOS:2 in the last 72 hours Liver Function Tests No results found for this basename: AST:2,ALT:2,ALKPHOS:2,BILITOT:2,PROT:2,ALBUMIN:2 in the last 72 hours No results found for this basename: LIPASE:2,AMYLASE:2 in the last 72 hours Cardiac Enzymes No results found for this basename: CKTOTAL:3,CKMB:3,CKMBINDEX:3,TROPONINI:3 in the last 72 hours BNP No components found with this basename: POCBNP:3 D-Dimer No results found for this basename: DDIMER:2 in the last 72 hours Hemoglobin A1C No results found for this basename: HGBA1C in the last 72 hours Fasting Lipid Panel No results found for this basename: CHOL,HDL,LDLCALC,TRIG,CHOLHDL,LDLDIRECT in the last 72 hours Thyroid Function Tests No results found for this basename: TSH,T4TOTAL,FREET3,T3FREE,THYROIDAB in the last 72 hours  TELE  NSR  ECG    Radiology/Studies  No results found.  ASSESSMENT AND PLAN  Active Problems:  CORONARY ARTERY DISEASE  Long term current use of anticoagulant    Plan to discharge tomorrow afternoon if INR >2.  Signed, Valera Castle MD

## 2011-04-09 ENCOUNTER — Encounter (HOSPITAL_COMMUNITY): Payer: Self-pay | Admitting: Physician Assistant

## 2011-04-09 DIAGNOSIS — R079 Chest pain, unspecified: Secondary | ICD-10-CM

## 2011-04-09 LAB — PROTIME-INR
INR: 2 — ABNORMAL HIGH (ref 0.00–1.49)
Prothrombin Time: 23 seconds — ABNORMAL HIGH (ref 11.6–15.2)

## 2011-04-09 MED ORDER — WARFARIN SODIUM 7.5 MG PO TABS
7.5000 mg | ORAL_TABLET | ORAL | Status: AC
  Administered 2011-04-09: 7.5 mg via ORAL
  Filled 2011-04-09: qty 1

## 2011-04-09 MED ORDER — LEVOTHYROXINE SODIUM 125 MCG PO TABS: 125.0000 ug | ORAL_TABLET | Freq: Every day | ORAL | Status: DC

## 2011-04-09 NOTE — Progress Notes (Signed)
Discharge review done with patient.   Patient acknowledged understanding of information provided.  Prescriptions and coumadin 7.5 mg given per MD's order.  Patient is stable, drove self home. Ephriam Knuckles

## 2011-04-09 NOTE — Discharge Summary (Signed)
Discharge Summary   Patient ID: [name] MRN: [id number], DOB/AGE: 02/20/1950 61 y.o. Admit date: 03/31/2011 D/C date:     04/09/2011   Primary Discharge Diagnoses:  1. CP/SOB with reassuring stable cath 04/03/11, EF 55-60% with focal akinesis of basal inferior Zailey Audia 2. Hypercoaguable state secondary to factor V Leiden deficiency  Secondary Discharge Diagnoses:  1. CAD - multiple interventions to the right coronary artery secondary to in-stent restenosis and thrombosis - 06/2010: inferior STEMI secondary to acute stent thrombosis of previously placed RCA stent a week after stopping Plavix, s/p RCA stenting with repeat intervention several days later with acute stent thrombosis with distal embolis s/p balloon angioplasty/aspiration thrombectomy 2. History of deep venous thrombosis and pulmonary embolism 3. Ongoing tobacco abuse 4. Chronic obstructive pulmonary disease. 5. Hyperlipidemia 6. Obstructive sleep apnea  7. Hypothyroidism  8. Hx of medication nonadherence  Hospital Course: 61 y/o F with hx of factor V Leiden, CAD with multiple interventions to the right coronary artery secondary to in-stent restenosis and thrombosis. She presented in [place] acute stent thrombosis of the previously placed RCA stent. This was a week after she had stopped taking Plavix. As a matter fact the patient ran out of Plavix. At that time, she presented with ST elevation myocardial infarction, cardiogenic shock requiring CPR and intubation with mechanical ventilation. She underwent a bare-metal stent placement to the right coronary artery and was reloaded with Plavix. After she was liberated from mechanical ventilation she developed recurrent substernal chest pain and underwent a repeat catheterization and was found to have acute stent from [facility] of the previously placed stent as well as distal embolus and to the PDA. She was treated with balloon angioplasty and aspiration thrombectomy of both sides. She was  then placed on ticagrelor and continued on Coumadin.   She returned to Dr. Margarita Mail office 03/30/11 and admitted she was unable to get ticagrelor and has had minimal medications. She was educated on the serious nature of noncompliance. She reported SOB and increasing chest tightness. The patient was due to be scheduled for an outpatient cardiac catheterization but could not afford Lovenox for crossover as an outpatient. Because she had not been able to fill her ticagrelor because of financial reasons, she had been started back on Plavix. The plan was to instead admit her to the hospital and bridge her to be able to perform catheterization. She underwent cath 04/03/11 demonstrating Final Conclusions:  1. Patent stented segment in the right coronary artery with mild in-stent restenosis  2. Minor nonobstructive plaque in the left main, LAD, and left circumflex.  3. Mild left ventricular segmental contraction abnormality with overall preserved LV function  The patient's cath study was felt reassuring. She was continued on ticagrelor 90 mg twice daily by Dr. Excell Seltzer. Case management has helped immensely regarding resources and left instructions with the patient regarding her point-of-contact for med assistance at the [facility]. Lovenox/Coumadin was restarted. ASA was held given long-term coumadin use. The next several days, she was observed in-house for crossover as she could not afford home Lovenox, and was felt high risk for venous thrombosis. She ambulated without CP or SOB. Today her INR is right at 2 - Dr. Daleen Squibb has reviewed her INRs. The patient is very insistent on going home and has threatened to leave AMA. We will give her her dose of Coumadin prior to discharge. Dr. Daleen Squibb has seen and examined her and feels she is stable for discharge. Of note, she also requested a  refill of her synthroid until she gets a new primary MD so we will supply her a short term rx at discharge.  Discharge  Vitals: Blood pressure 124/57, pulse 79, temperature 97.9 F (36.6 C), temperature source Oral, resp. rate 18, height 5\' 6"  (1.676 m), weight 212 lb 14.4 oz (96.571 kg), SpO2 94.00%.  Labs: Lab Results  Component Value Date   WBC 6.8 04/03/2011   HGB 13.8 04/03/2011   HCT 42.7 04/03/2011   MCV 93.6 04/03/2011   PLT 237 04/03/2011     Lab 04/03/11 1114  NA --  K --  CL --  CO2 --  BUN --  CREATININE 0.69  CALCIUM --  PROT --  BILITOT --  ALKPHOS --  ALT --  AST --  GLUCOSE --    Lab Results  Component Value Date   CHOL 188 06/22/2010   HDL 40 06/22/2010   LDLCALC 131* 06/22/2010   TRIG 87 06/22/2010    Diagnostic Studies/Procedures   1. Cardiac catheterization this admission, please see full report and above for summary.  Discharge Medications   Medication List  As of 04/09/2011 11:39 AM   STOP taking these medications         aspirin 81 MG tablet         TAKE these medications         acetaminophen 500 MG tablet   Commonly known as: TYLENOL   Take 500 mg by mouth every 8 (eight) hours as needed. For headache      ADVAIR DISKUS 250-50 MCG/DOSE Aepb   Generic drug: Fluticasone-Salmeterol   Inhale 1 puff into the lungs every 12 (twelve) hours.      albuterol 108 (90 BASE) MCG/ACT inhaler   Commonly known as: PROVENTIL HFA;VENTOLIN HFA   Inhale 2 puffs into the lungs every 4 (four) hours as needed. For shortness of breath      fish oil-omega-3 fatty acids 1000 MG capsule   Take 2 g by mouth daily.      imipramine 25 MG tablet   Commonly known as: TOFRANIL   Take 25 mg by mouth at bedtime.      isosorbide dinitrate 20 MG tablet   Commonly known as: ISORDIL   Take 1 tablet (20 mg total) by mouth 2 (two) times daily.      levothyroxine 125 MCG tablet   Commonly known as: SYNTHROID, LEVOTHROID   Take 1 tablet (125 mcg total) by mouth daily.      lisinopril 5 MG tablet   Commonly known as: PRINIVIL,ZESTRIL   Take 0.5 tablets (2.5 mg total) by mouth daily.       metoprolol tartrate 25 MG tablet   Commonly known as: LOPRESSOR   Take 1 tablet (25 mg total) by mouth 2 (two) times daily.      nitroGLYCERIN 0.4 MG SL tablet   Commonly known as: NITROSTAT   Place 0.4 mg under the tongue every 5 (five) minutes as needed. For chest pain      rosuvastatin 20 MG tablet   Commonly known as: CRESTOR   Take 20 mg by mouth daily.      Ticagrelor 90 MG Tabs tablet   Commonly known as: BRILINTA   Take 1 tablet (90 mg total) by mouth 2 (two) times daily.      tiotropium 18 MCG inhalation capsule   Commonly known as: SPIRIVA   Place 18 mcg into inhaler and inhale daily.      warfarin 5  MG tablet   Commonly known as: COUMADIN   Take 5 mg by mouth as directed. Takes 1 and a half tablets daily (7.5mg ) except 1 tablet on Sundays (5mg )            Disposition   The patient will be discharged in stable condition to home. Discharge Orders    Future Appointments: Provider: Department: Dept Phone: Center:   04/13/2011 3:10 PM [facility] [phone] [facility]   06/02/2011 1:00 PM June Leap, MD [facility] [phone] [facility]     Future Orders Please Complete By Expires   Diet - low sodium heart healthy      Increase activity slowly      Comments:   No lifting over 5 lbs for 1 week. No sexual activity for 1 week. Keep procedure site clean & dry. If you notice increased pain, swelling, bleeding or pus, call/return!  You may shower, but no soaking baths/hot tubs/pools for 1 week.       Follow-up Information    Follow up with Peyton Bottoms, MD. (Our office will call you with an appointment)    Contact information:   [address] [place] [place] [address] [phone]       Follow up with [facility]. (04/13/11 at 3:10pm)            Duration of Discharge Encounter: 50 minutes including physician and PA time.  Signed, Ronie Spies PA-C 04/09/2011, 11:39 AM  Jesse Sans.  Daleen Squibb, MD, [facility]  [facility] Pager:  [phone]

## 2011-04-09 NOTE — Progress Notes (Signed)
ANTICOAGULATION CONSULT NOTE - Follow Up Consult  Pharmacy Consult for Coumadin Indication: Hx Factor V Leiden deficiency, H/o PE/DVT  Allergies  Allergen Reactions  . Minocycline Hcl     REACTION: Dizzy  . Prednisone     REACTION: feels like throat swelling  . Varenicline Tartrate     REACTION: Dizzy  . Zocor (Simvastatin - High Dose) Other (See Comments)    myalgia   Vital Signs: Temp: 97.9 F (36.6 C) (03/31 0500) BP: 124/57 mmHg (03/31 0942) Pulse Rate: 79  (03/31 0942)  Labs:  Basename 04/09/11 0528 04/08/11 0500 04/07/11 0630  HGB -- -- --  HCT -- -- --  PLT -- -- --  APTT -- -- --  LABPROT 23.0* 22.7* 21.3*  INR 2.00* 1.96* 1.81*  HEPARINUNFRC -- -- --  CREATININE -- -- --  CKTOTAL -- -- --  CKMB -- -- --  TROPONINI -- -- --   Estimated Creatinine Clearance: 87.6 ml/min (by C-G formula based on Cr of 0.69).  Assessment: 60yof continues on coumadin with a therapeutic INR. No bleeding noted. Do not want to resume home dose just yet as I think INR will drop below 2 if a lower dose (5mg ) is given today.  Goal of Therapy:  INR 2-3   Plan:  1) Coumadin 7.5mg  prior to discharge 2) On 4/1, resume 7.5mg  daily except 5mg  on Sunday  Fredrik Rigger 04/09/2011,1:14 PM

## 2011-04-13 ENCOUNTER — Ambulatory Visit (INDEPENDENT_AMBULATORY_CARE_PROVIDER_SITE_OTHER): Payer: Medicare Other | Admitting: *Deleted

## 2011-04-13 DIAGNOSIS — I2699 Other pulmonary embolism without acute cor pulmonale: Secondary | ICD-10-CM

## 2011-04-13 DIAGNOSIS — D6851 Activated protein C resistance: Secondary | ICD-10-CM

## 2011-04-13 DIAGNOSIS — Z7901 Long term (current) use of anticoagulants: Secondary | ICD-10-CM

## 2011-04-13 DIAGNOSIS — D6859 Other primary thrombophilia: Secondary | ICD-10-CM | POA: Diagnosis not present

## 2011-04-13 LAB — POCT INR: INR: 2

## 2011-04-18 ENCOUNTER — Other Ambulatory Visit: Payer: Self-pay | Admitting: *Deleted

## 2011-04-18 MED ORDER — METOPROLOL SUCCINATE ER 50 MG PO TB24: 50.0000 mg | ORAL_TABLET | Freq: Every day | ORAL | Status: DC

## 2011-04-18 MED ORDER — ATORVASTATIN CALCIUM 40 MG PO TABS: 40.0000 mg | ORAL_TABLET | Freq: Every evening | ORAL | Status: DC

## 2011-04-18 MED ORDER — TICAGRELOR 90 MG PO TABS: 90.0000 mg | ORAL_TABLET | Freq: Two times a day (BID) | ORAL | Status: DC

## 2011-05-01 ENCOUNTER — Ambulatory Visit (INDEPENDENT_AMBULATORY_CARE_PROVIDER_SITE_OTHER): Payer: Medicare Other | Admitting: Physician Assistant

## 2011-05-01 ENCOUNTER — Encounter: Payer: Self-pay | Admitting: Physician Assistant

## 2011-05-01 VITALS — BP 116/74 | HR 84 | Ht 66.0 in | Wt 210.0 lb

## 2011-05-01 DIAGNOSIS — Z79899 Other long term (current) drug therapy: Secondary | ICD-10-CM | POA: Diagnosis not present

## 2011-05-01 DIAGNOSIS — F17200 Nicotine dependence, unspecified, uncomplicated: Secondary | ICD-10-CM

## 2011-05-01 DIAGNOSIS — E785 Hyperlipidemia, unspecified: Secondary | ICD-10-CM

## 2011-05-01 DIAGNOSIS — Z7901 Long term (current) use of anticoagulants: Secondary | ICD-10-CM

## 2011-05-01 DIAGNOSIS — I2510 Atherosclerotic heart disease of native coronary artery without angina pectoris: Secondary | ICD-10-CM

## 2011-05-01 MED ORDER — NITROGLYCERIN 0.4 MG SL SUBL: 0.4000 mg | SUBLINGUAL_TABLET | SUBLINGUAL | Status: DC | PRN

## 2011-05-01 NOTE — Assessment & Plan Note (Signed)
The importance of smoking cessation was revisited, and patient appears motivated to stop. She reports that she had previously quit for 4 months.

## 2011-05-01 NOTE — Assessment & Plan Note (Addendum)
Continue aggressive secondary prevention measures with current medication regimen, including Ticagrelor and Coumadin. The patient's understanding is that she is to remain on Ticagrelor, indefinitely.

## 2011-05-01 NOTE — Progress Notes (Signed)
HPI: Patient presents for post catheterization followup. She was brought in for Lovenox bridging and diagnostic catheterization, per Dr Andee Lineman, when last seen here, 02/24/11.  The patient was due to be scheduled for an outpatient cardiac catheterization, but could not afford Lovenox for crossover as an outpatient. Because she had not been able to fill her ticagrelor, because of financial reasons, she had been started back on Plavix. The plan was to instead admit her to the hospital, and bridge her to be able to perform catheterization. She underwent cath 04/03/11 demonstrating:  Final Conclusions:  1. Patent stented segment in the right coronary artery with mild in-stent restenosis  2. Minor nonobstructive plaque in the left main, LAD, and left circumflex.  3. Mild left ventricular segmental contraction abnormality with overall preserved LV function  Recommendations: The patient's cath study is reassuring. She will be continued on ticagrelor 90 mg twice daily. Coumadin will be restarted tonight with a Lovenox bridge resumed this afternoon. She will be discharged from the hospital when her INR is therapeutic. We will not treat her with aspirin since she will require long-term Coumadin.    patient has since reestablish with our Coumadin clinic in [place], for ongoing monitoring/management. She has factor V Leiden deficiency. She is also receiving her Brilinta medication samples, from the manufacturer.  Clinically, she reports no significant change from her baseline. Namely, she has intermittent, unpredictable CP, either at rest or with exertion. She also continues to get SOB at times, and unfortunately continues to smoke. Of note, she is scheduled to resume followup with her pulmonologist, Dr. Marcelyn Bruins, in [place].  She reports no complications of the R. wrist incision site.    Allergies  Allergen Reactions  . Minocycline Hcl     REACTION: Dizzy  . Prednisone     REACTION: feels like  throat swelling  . Varenicline Tartrate     REACTION: Dizzy  . Zocor (Simvastatin - High Dose) Other (See Comments)    myalgia    Current Outpatient Prescriptions  Medication Sig Dispense Refill  . acetaminophen (TYLENOL) 500 MG tablet Take 500 mg by mouth every 8 (eight) hours as needed. For headache      . albuterol (PROVENTIL HFA;VENTOLIN HFA) 108 (90 BASE) MCG/ACT inhaler Inhale 2 puffs into the lungs every 4 (four) hours as needed. For shortness of breath      . fish oil-omega-3 fatty acids 1000 MG capsule Take 2 g by mouth daily.        . Fluticasone-Salmeterol (ADVAIR DISKUS) 250-50 MCG/DOSE AEPB Inhale 1 puff into the lungs every 12 (twelve) hours.        [name] imipramine (TOFRANIL) 25 MG tablet Take 25 mg by mouth at bedtime.        . isosorbide dinitrate (ISORDIL) 20 MG tablet Take 1 tablet (20 mg total) by mouth 2 (two) times daily.  60 tablet  6  . levothyroxine (SYNTHROID, LEVOTHROID) 125 MCG tablet Take 1 tablet (125 mcg total) by mouth daily.  30 tablet  0  . lisinopril (PRINIVIL,ZESTRIL) 5 MG tablet Take 0.5 tablets (2.5 mg total) by mouth daily.  15 tablet  6  . metoprolol succinate (TOPROL XL) 50 MG 24 hr tablet Take 1 tablet (50 mg total) by mouth daily.  90 tablet  3  . nitroGLYCERIN (NITROSTAT) 0.4 MG SL tablet Place 0.4 mg under the tongue every 5 (five) minutes as needed. For chest pain      . rosuvastatin (CRESTOR) 20 MG tablet Take 20  mg by mouth every evening.      . Ticagrelor (BRILINTA) 90 MG TABS tablet Take 1 tablet (90 mg total) by mouth 2 (two) times daily.  180 tablet  3  . tiotropium (SPIRIVA) 18 MCG inhalation capsule Place 18 mcg into inhaler and inhale daily.        [name] warfarin (COUMADIN) 5 MG tablet Take 5 mg by mouth as directed. Takes 1 and a half tablets daily (7.5mg ) except 1 tablet on Sundays (5mg ) MANAGED BY [facility] IN [place]      . metoprolol (TOPROL-XL) 50 MG 24 hr tablet Take 50 mg by mouth 2 (two) times daily.          Past Medical History    Diagnosis Date  . CAD April 2011    Inferior STEMI with multiple angioplasties and stent placement  to RCA. Subsequent acute stent thrombosis 06/2010 requiring 2 separate interventions (Zeta stent, then repeat cath with thrombectomy). Cath 03/2011 stable anatomy, EF 55-60% with focal basal inferior akinesis  . DVT (deep venous thrombosis)   . Pulmonary embolism   . Factor V Leiden, prothrombin gene mutation   . Hyperlipidemia   . COPD (chronic obstructive pulmonary disease)   . OSA (obstructive sleep apnea)   . Tobacco abuse   . Hypothyroidism   . Noncompliance     Past Surgical History  Procedure Date  . Cardiac catheterization   . Coronary angioplasty     History   Social History  . Marital Status: Divorced    Spouse Name: N/A    Number of Children: N/A  . Years of Education: N/A   Occupational History  . Not on file.   Social History Main Topics  . Smoking status: Current Some Day Smoker -- 0.5 packs/day for 45 years    Types: Cigarettes  . Smokeless tobacco: Never Used  . Alcohol Use: No  . Drug Use: No  . Sexually Active: Not on file   Other Topics Concern  . Not on file   Social History Narrative  . No narrative on file    No family history on file.  ROS: no nausea, vomiting; no fever, chills; no melena, hematochezia; no claudication  PHYSICAL EXAM: BP 116/74  Pulse 84  Ht 5\' 6"  (1.676 m)  Wt 210 lb (95.255 kg)  BMI 33.89 kg/m2 GENERAL: 61 year old female; NAD HEENT: NCAT, PERRLA, EOMI; sclera clear; no xanthelasma NECK: palpable bilateral carotid pulses, no bruits; no JVD; no TM LUNGS: CTA bilaterally CARDIAC: RRR (S1, S2); no significant murmurs; no rubs or gallops ABDOMEN: soft, non-tender; intact BS EXTREMETIES: Right wrist stable, with no hematoma/ecchymosis; no significant peripheral edema SKIN: warm/dry; no obvious rash/lesions MUSCULOSKELETAL: no joint deformity NEURO: no focal deficit; NL affect   EKG:    ASSESSMENT & PLAN:

## 2011-05-01 NOTE — Assessment & Plan Note (Signed)
Patient has factor V Leiden deficiency, and is on lifelong Coumadin anticoagulation. She is followed in our [place] clinic. Her most recent INR was 2.0, on 04/13/11.

## 2011-05-01 NOTE — Assessment & Plan Note (Signed)
Aggressive lipid management recommended, with target LDL 70 or else, if feasible. Will check FLP/LFT profile. Continue Crestor at current dose, pending review.

## 2011-05-01 NOTE — Patient Instructions (Signed)
Your physician wants you to follow-up in: 6 months. You will receive a reminder letter in the mail one-two months in advance. If you don't receive a letter, please call our office to schedule the follow-up appointment. Your physician recommends that you continue on your current medications as directed. Please refer to the Current Medication list given to you today. (Nitroglycerin refilled.) Your physician recommends that you go to the [facility] for a FASTING lipid profile and liver function labs. Do not eat or drink after midnight.  If the results of your test are normal or stable, you will receive a letter. If they are abnormal, the nurse will contact you by phone.

## 2011-05-02 ENCOUNTER — Other Ambulatory Visit: Payer: Self-pay | Admitting: Physician Assistant

## 2011-05-02 DIAGNOSIS — E785 Hyperlipidemia, unspecified: Secondary | ICD-10-CM | POA: Diagnosis not present

## 2011-05-02 DIAGNOSIS — Z79899 Other long term (current) drug therapy: Secondary | ICD-10-CM | POA: Diagnosis not present

## 2011-05-02 LAB — LIPID PANEL
Cholesterol: 218 mg/dL — ABNORMAL HIGH (ref 0–200)
VLDL: 27 mg/dL (ref 0–40)

## 2011-05-02 LAB — HEPATIC FUNCTION PANEL
ALT: 15 U/L (ref 0–35)
AST: 15 U/L (ref 0–37)
Alkaline Phosphatase: 91 U/L (ref 39–117)
Bilirubin, Direct: 0.1 mg/dL (ref 0.0–0.3)
Indirect Bilirubin: 0.2 mg/dL (ref 0.0–0.9)

## 2011-05-04 ENCOUNTER — Ambulatory Visit (INDEPENDENT_AMBULATORY_CARE_PROVIDER_SITE_OTHER): Payer: Medicare Other | Admitting: *Deleted

## 2011-05-04 DIAGNOSIS — D6851 Activated protein C resistance: Secondary | ICD-10-CM

## 2011-05-04 DIAGNOSIS — I2699 Other pulmonary embolism without acute cor pulmonale: Secondary | ICD-10-CM | POA: Diagnosis not present

## 2011-05-04 DIAGNOSIS — Z7901 Long term (current) use of anticoagulants: Secondary | ICD-10-CM | POA: Diagnosis not present

## 2011-05-04 DIAGNOSIS — D6859 Other primary thrombophilia: Secondary | ICD-10-CM

## 2011-05-04 LAB — POCT INR: INR: 3

## 2011-05-09 MED FILL — Nicardipine HCl IV Soln 2.5 MG/ML: INTRAVENOUS | Qty: 1 | Status: AC

## 2011-05-15 ENCOUNTER — Encounter: Payer: Self-pay | Admitting: Internal Medicine

## 2011-05-15 ENCOUNTER — Ambulatory Visit (INDEPENDENT_AMBULATORY_CARE_PROVIDER_SITE_OTHER): Payer: Medicare Other | Admitting: Internal Medicine

## 2011-05-15 VITALS — BP 122/70 | HR 80 | Temp 98.1°F | Ht 66.0 in | Wt 206.0 lb

## 2011-05-15 DIAGNOSIS — E785 Hyperlipidemia, unspecified: Secondary | ICD-10-CM

## 2011-05-15 DIAGNOSIS — Z7901 Long term (current) use of anticoagulants: Secondary | ICD-10-CM | POA: Diagnosis not present

## 2011-05-15 DIAGNOSIS — F17200 Nicotine dependence, unspecified, uncomplicated: Secondary | ICD-10-CM

## 2011-05-15 DIAGNOSIS — J4489 Other specified chronic obstructive pulmonary disease: Secondary | ICD-10-CM

## 2011-05-15 DIAGNOSIS — I2510 Atherosclerotic heart disease of native coronary artery without angina pectoris: Secondary | ICD-10-CM | POA: Diagnosis not present

## 2011-05-15 DIAGNOSIS — J449 Chronic obstructive pulmonary disease, unspecified: Secondary | ICD-10-CM

## 2011-05-15 DIAGNOSIS — E039 Hypothyroidism, unspecified: Secondary | ICD-10-CM

## 2011-05-15 LAB — BASIC METABOLIC PANEL WITH GFR
BUN: 13 mg/dL (ref 6–23)
CO2: 29 meq/L (ref 19–32)
Calcium: 9.1 mg/dL (ref 8.4–10.5)
Chloride: 103 meq/L (ref 96–112)
Creatinine, Ser: 0.8 mg/dL (ref 0.4–1.2)
GFR: 79.79 mL/min (ref >60.00)
Glucose, Bld: 83 mg/dL (ref 70–99)
Potassium: 4.4 meq/L (ref 3.5–5.1)
Sodium: 142 meq/L (ref 135–145)

## 2011-05-15 LAB — CBC WITH DIFFERENTIAL/PLATELET
Basophils Absolute: 0 K/uL (ref 0.0–0.1)
Basophils Relative: 0.7 % (ref 0.0–3.0)
Eosinophils Absolute: 0.2 K/uL (ref 0.0–0.7)
Eosinophils Relative: 3 % (ref 0.0–5.0)
HCT: 47.2 % — ABNORMAL HIGH (ref 36.0–46.0)
Hemoglobin: 15.7 g/dL — ABNORMAL HIGH (ref 12.0–15.0)
Lymphocytes Relative: 29.5 % (ref 12.0–46.0)
Lymphs Abs: 2.1 K/uL (ref 0.7–4.0)
MCHC: 33.3 g/dL (ref 30.0–36.0)
MCV: 92.5 fl (ref 78.0–100.0)
Monocytes Absolute: 0.4 K/uL (ref 0.1–1.0)
Monocytes Relative: 5.2 % (ref 3.0–12.0)
Neutro Abs: 4.4 K/uL (ref 1.4–7.7)
Neutrophils Relative %: 61.6 % (ref 43.0–77.0)
Platelets: 316 K/uL (ref 150.0–400.0)
RBC: 5.11 Mil/uL (ref 3.87–5.11)
RDW: 14.1 % (ref 11.5–14.6)
WBC: 7.1 K/uL (ref 4.5–10.5)

## 2011-05-15 LAB — LIPID PANEL
Cholesterol: 143 mg/dL (ref 0–200)
HDL: 35.4 mg/dL — ABNORMAL LOW (ref >39.00)
LDL Cholesterol: 91 mg/dL (ref 0–99)
Total CHOL/HDL Ratio: 4
Triglycerides: 81 mg/dL (ref 0.0–149.0)
VLDL: 16.2 mg/dL (ref 0.0–40.0)

## 2011-05-15 LAB — HEPATIC FUNCTION PANEL
AST: 18 U/L (ref 0–37)
Alkaline Phosphatase: 94 U/L (ref 39–117)
Bilirubin, Direct: 0 mg/dL (ref 0.0–0.3)
Total Bilirubin: 0.5 mg/dL (ref 0.3–1.2)

## 2011-05-15 LAB — TSH: TSH: 1.19 u[IU]/mL (ref 0.35–5.50)

## 2011-05-15 NOTE — Progress Notes (Signed)
Subjective:    Patient ID: [name], female    DOB: 06-07-1950, 61 y.o.   MRN: [id number]  HPI  61 year old white female with history of COPD, chronic tobacco use, hypothyroidism and coronary artery disease for followup. It has been since 2008 since I saw patient at the [place] office.  She has not been followed by another PCP.  She reports having 3 heart attacks since previous visit.   She is followed by Dr. Andee Lineman.  She also suffered a significant fall in 2009 falling out of her truck.  She suffered pelvic fracture and has been disabled since.  She lives by herself.   She has a daughter in [place] that checks on her occasionally.  Despite her heart disease, she continues to smoke 1/2 ppd.   It makes her cough.   She mainly smokes at night.  Review of Systems Negative for chest pain.  Negative for abnormal bleeding (mild occasional nose bleeds)    Past Medical History  Diagnosis Date  . CAD April 2011    Inferior STEMI with multiple angioplasties and stent placement  to RCA. Subsequent acute stent thrombosis 06/2010 requiring 2 separate interventions (Zeta stent, then repeat cath with thrombectomy). Cath 03/2011 stable anatomy, EF 55-60% with focal basal inferior akinesis  . DVT (deep venous thrombosis)   . Pulmonary embolism   . Factor V Leiden, prothrombin gene mutation   . Hyperlipidemia   . COPD (chronic obstructive pulmonary disease)   . OSA (obstructive sleep apnea)   . Tobacco abuse   . Hypothyroidism   . Noncompliance     History   Social History  . Marital Status: Divorced    Spouse Name: N/A    Number of Children: N/A  . Years of Education: N/A   Occupational History  . Not on file.   Social History Main Topics  . Smoking status: Current Some Day Smoker -- 0.5 packs/day for 45 years    Types: Cigarettes  . Smokeless tobacco: Never Used  . Alcohol Use: No  . Drug Use: No  . Sexually Active: Not on file   Other Topics Concern  . Not on file   Social  History Narrative  . No narrative on file    Past Surgical History  Procedure Date  . Cardiac catheterization   . Coronary angioplasty     No family history on file.  Allergies  Allergen Reactions  . Minocycline Hcl     REACTION: Dizzy  . Prednisone     REACTION: feels like throat swelling  . Varenicline Tartrate     REACTION: Dizzy  . Zocor (Simvastatin - High Dose) Other (See Comments)    myalgia    Current Outpatient Prescriptions on File Prior to Visit  Medication Sig Dispense Refill  . acetaminophen (TYLENOL) 500 MG tablet Take 500 mg by mouth every 8 (eight) hours as needed. For headache      . albuterol (PROVENTIL HFA;VENTOLIN HFA) 108 (90 BASE) MCG/ACT inhaler Inhale 2 puffs into the lungs every 4 (four) hours as needed. For shortness of breath      . fish oil-omega-3 fatty acids 1000 MG capsule Take 2 g by mouth daily.        . Fluticasone-Salmeterol (ADVAIR DISKUS) 250-50 MCG/DOSE AEPB Inhale 1 puff into the lungs every 12 (twelve) hours.        [name] imipramine (TOFRANIL) 25 MG tablet Take 25 mg by mouth at bedtime.        . isosorbide dinitrate (  ISORDIL) 20 MG tablet Take 1 tablet (20 mg total) by mouth 2 (two) times daily.  60 tablet  6  . levothyroxine (SYNTHROID, LEVOTHROID) 125 MCG tablet Take 1 tablet (125 mcg total) by mouth daily.  30 tablet  0  . lisinopril (PRINIVIL,ZESTRIL) 5 MG tablet Take 0.5 tablets (2.5 mg total) by mouth daily.  15 tablet  6  . nitroGLYCERIN (NITROSTAT) 0.4 MG SL tablet Place 1 tablet (0.4 mg total) under the tongue every 5 (five) minutes as needed for chest pain.  25 tablet  2  . rosuvastatin (CRESTOR) 20 MG tablet Take 20 mg by mouth every evening.      . Ticagrelor (BRILINTA) 90 MG TABS tablet Take 1 tablet (90 mg total) by mouth 2 (two) times daily.  180 tablet  3  . tiotropium (SPIRIVA) 18 MCG inhalation capsule Place 18 mcg into inhaler and inhale daily.        [name] warfarin (COUMADIN) 5 MG tablet Take 5 mg by mouth as directed. Takes 1  and a half tablets daily (7.5mg ) except 1 tablet on Sundays (5mg ) MANAGED BY LISA IN [place]      . DISCONTD: metoprolol (TOPROL-XL) 50 MG 24 hr tablet Take 50 mg by mouth 2 (two) times daily.        [name] DISCONTD: metoprolol succinate (TOPROL XL) 50 MG 24 hr tablet Take 1 tablet (50 mg total) by mouth daily.  90 tablet  3    BP 122/70  Pulse 80  Temp(Src) 98.1 F (36.7 C) (Oral)  Ht 5\' 6"  (1.676 m)  Wt 206 lb (93.441 kg)  BMI 33.25 kg/m2    Objective:   Physical Exam  Constitutional: She is oriented to person, place, and time. She appears well-developed and well-nourished. No distress.  HENT:  Head: Normocephalic and atraumatic.  Neck: Neck supple.       No carotid bruit  Cardiovascular: Normal rate, regular rhythm and normal heart sounds.   Pulmonary/Chest: Effort normal and breath sounds normal. She has no wheezes.  Abdominal: Soft. Bowel sounds are normal.  Musculoskeletal:       Trace lower extremity edema bilaterally  Neurological: She is alert and oriented to person, place, and time. No cranial nerve deficit.  Skin: Skin is warm and dry.  Psychiatric: She has a normal mood and affect. Her behavior is normal.       Assessment & Plan:

## 2011-05-15 NOTE — Assessment & Plan Note (Signed)
Check LFT/FLP.  Continue Crestor.

## 2011-05-15 NOTE — Assessment & Plan Note (Signed)
Monitor TFTs.  Adjust levothyroxine dose accordingly. Lab Results  Component Value Date   TSH 2.824 06/23/2010

## 2011-05-15 NOTE — Assessment & Plan Note (Signed)
The patient unable to tolerate Chantix or nicotine patches in the past. Patient advised to use nicotine lozenges as needed.

## 2011-05-15 NOTE — Assessment & Plan Note (Signed)
Stable.   Continue maintenance inhalers.  Work on tobacco cessation.

## 2011-05-16 ENCOUNTER — Other Ambulatory Visit: Payer: Self-pay | Admitting: Physician Assistant

## 2011-05-17 ENCOUNTER — Telehealth: Payer: Self-pay | Admitting: *Deleted

## 2011-05-17 ENCOUNTER — Other Ambulatory Visit: Payer: Self-pay | Admitting: *Deleted

## 2011-05-17 MED ORDER — LEVOTHYROXINE SODIUM 125 MCG PO TABS: 125.0000 ug | ORAL_TABLET | Freq: Every day | ORAL | Status: DC

## 2011-05-17 NOTE — Telephone Encounter (Signed)
Message copied by Murriel Hopper on Wed May 17, 2011  9:27 AM ------      Message from: Prescott Parma C      Created: Wed May 03, 2011 10:18 AM       Increase Crestor to 40 daily, repeat FLP/LFT in 12 weeks

## 2011-05-17 NOTE — Telephone Encounter (Signed)
Notes Recorded by Lesle Chris, LPN on 01/14/1094 at 9:27 AM Return call from patient. Stated she spoke with Blima Singer, has to meet with her tomorrow & fill out more paper work. Will call when need refill. Notes Recorded by Lesle Chris, LPN on 0/04/5407 at 9:11 AM Follow up call placed to patient. States she has not been able to get in touch with Blima Singer at [facility]. States she will try calling her again this morning. Advised her to call back to let me know if she can not reach her today. Notes Recorded by Lesle Chris, LPN on 08/19/9145 at 11:56 AM Patient notified of below. She will check with Blima Singer at [facility] to see if she can get Crestor for her. She will call back to let me know. Notes Recorded by Lesle Chris, LPN on 09/07/5619 at 10:30 AM Left message to return call.

## 2011-05-23 ENCOUNTER — Ambulatory Visit: Payer: Medicare Other | Admitting: Cardiology

## 2011-05-24 ENCOUNTER — Ambulatory Visit: Payer: Medicare Other | Admitting: Pulmonary Disease

## 2011-05-25 ENCOUNTER — Ambulatory Visit (INDEPENDENT_AMBULATORY_CARE_PROVIDER_SITE_OTHER): Payer: Medicare Other | Admitting: *Deleted

## 2011-05-25 DIAGNOSIS — D6851 Activated protein C resistance: Secondary | ICD-10-CM

## 2011-05-25 DIAGNOSIS — I2699 Other pulmonary embolism without acute cor pulmonale: Secondary | ICD-10-CM

## 2011-05-25 DIAGNOSIS — Z7901 Long term (current) use of anticoagulants: Secondary | ICD-10-CM

## 2011-05-25 DIAGNOSIS — D6859 Other primary thrombophilia: Secondary | ICD-10-CM

## 2011-06-02 ENCOUNTER — Ambulatory Visit: Payer: Medicare Other | Admitting: Cardiology

## 2011-06-08 ENCOUNTER — Ambulatory Visit (INDEPENDENT_AMBULATORY_CARE_PROVIDER_SITE_OTHER): Payer: Medicare Other | Admitting: *Deleted

## 2011-06-08 DIAGNOSIS — D6859 Other primary thrombophilia: Secondary | ICD-10-CM

## 2011-06-08 DIAGNOSIS — D6851 Activated protein C resistance: Secondary | ICD-10-CM

## 2011-06-08 DIAGNOSIS — I2699 Other pulmonary embolism without acute cor pulmonale: Secondary | ICD-10-CM

## 2011-06-08 DIAGNOSIS — Z7901 Long term (current) use of anticoagulants: Secondary | ICD-10-CM

## 2011-06-13 ENCOUNTER — Ambulatory Visit (INDEPENDENT_AMBULATORY_CARE_PROVIDER_SITE_OTHER): Payer: Medicare Other | Admitting: Pulmonary Disease

## 2011-06-13 ENCOUNTER — Ambulatory Visit (INDEPENDENT_AMBULATORY_CARE_PROVIDER_SITE_OTHER)
Admission: RE | Admit: 2011-06-13 | Discharge: 2011-06-13 | Disposition: A | Discharge status: Home / Self Care | Payer: Medicare Other | Source: Ambulatory Visit | Attending: Pulmonary Disease | Admitting: Pulmonary Disease

## 2011-06-13 ENCOUNTER — Encounter: Payer: Self-pay | Admitting: Pulmonary Disease

## 2011-06-13 VITALS — BP 130/78 | HR 90 | Temp 98.1°F | Ht 66.0 in | Wt 207.0 lb

## 2011-06-13 DIAGNOSIS — R0602 Shortness of breath: Secondary | ICD-10-CM | POA: Diagnosis not present

## 2011-06-13 DIAGNOSIS — R0989 Other specified symptoms and signs involving the circulatory and respiratory systems: Secondary | ICD-10-CM

## 2011-06-13 DIAGNOSIS — R05 Cough: Secondary | ICD-10-CM | POA: Diagnosis not present

## 2011-06-13 DIAGNOSIS — R0609 Other forms of dyspnea: Secondary | ICD-10-CM

## 2011-06-13 MED ORDER — METHYLPREDNISOLONE 16 MG PO TABS: ORAL_TABLET | ORAL | Status: DC

## 2011-06-13 NOTE — Progress Notes (Signed)
Addended by: Michel Bickers A on: 06/13/2011 02:37 PM   Modules accepted: Orders

## 2011-06-13 NOTE — Patient Instructions (Signed)
Continue on advair and spiriva.  These are good meds for your copd You must stop smoking 100% if you want your breathing to improve.  The meds alone will not make you better. Will send for a cxr today since you have not had one since last year. Will treat with a course of medrol, which should be much better tolerated than prednisone. Would like to see you back in 3 weeks, where we will do breathing tests same day and also check your oxygen level while walking.

## 2011-06-13 NOTE — Progress Notes (Signed)
  Subjective:    Patient ID: [name], female    DOB: [birth year], 61 y.o.   MRN: [id number]  HPI The patient is a 61 year old female who comes in today as a self-referral for evaluation of dyspnea.  I had seen her in the distant past, and apparently she has been given a diagnosis of COPD.  Her chart is currently in storage in the warehouse, and I have no other data to go by.  She is using Advair and Spiriva for her breathing issues, but unfortunately continues to smoke.  She has had worsening shortness of breath over the last one year despite being compliant with her inhalers.  She describes a less than one block dyspnea on exertion at a moderate pace on flat ground, and will get winded bringing groceries in from the car or doing light housework.  She has a persistent cough with occasionally discolored mucus.  Again, she continues to smoke and is also on an ACE inhibitor.  She keeps mild peripheral edema, but has not seen any significant worsening.  She has had a recent cardiac catheterization with no significant coronary obstruction.  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  She has been maintained on chronic anti-coagulation with adequate protimes.   Review of Systems  Constitutional: Negative.  Negative for fever and unexpected weight change.  HENT: Positive for sneezing. Negative for ear pain, nosebleeds, congestion, sore throat, rhinorrhea, trouble swallowing, dental problem, postnasal drip and sinus pressure.   Eyes: Negative.  Negative for redness and itching.  Respiratory: Positive for cough, chest tightness and shortness of breath. Negative for wheezing.   Cardiovascular: Positive for chest pain. Negative for palpitations and leg swelling.  Gastrointestinal: Negative for nausea and vomiting.       Heartburn Indigestion   Genitourinary: Negative.  Negative for dysuria.    Musculoskeletal: Negative.  Negative for joint swelling.  Skin: Negative.  Negative for rash.  Neurological: Negative.  Negative for headaches.  Hematological: Negative.  Does not bruise/bleed easily.  Psychiatric/Behavioral: Negative.  Negative for dysphoric mood. The patient is not nervous/anxious.        Objective:   Physical Exam Constitutional:  Obese female, no acute distress  HENT:  Nares patent without discharge  Oropharynx without exudate, palate and uvula are normal  Eyes:  Perrla, eomi, no scleral icterus  Neck:  No JVD, no TMG  Cardiovascular:  Normal rate, regular rhythm, no rubs or gallops.  No murmurs        Intact distal pulses but decreased.  Pulmonary :  decreased breath sounds, no stridor or respiratory distress   +rales, rhonchi, and wheezing present throughout  Abdominal:  Soft, nondistended, bowel sounds present.  No tenderness noted.   Musculoskeletal:  1+ lower extremity edema noted, +varicosities.  Lymph Nodes:  No cervical lymphadenopathy noted  Skin:  No cyanosis noted  Neurologic:  Alert, appropriate, moves all 4 extremities without obvious deficit.         Assessment & Plan:

## 2011-06-13 NOTE — Assessment & Plan Note (Signed)
The patient has dyspnea on exertion that I suspect is multifactorial.  She is obese with significant deconditioning, has a history of underlying COPD (PFTs are not available at this time), and has a history of underlying heart disease.  She also has a history of thromboembolic disease, for which she is on chronic anticoagulation.  I do not see a recent echocardiogram, but she may need to have one to estimate her pulmonary artery pressures.  Unfortunately, she is continuing to smoke, which keeps her airways in a constant state of inflammation.  I have explained to her this causes recurrent pulmonary infections and also COPD exacerbations.  She is on an excellent bronchodilator regimen, and I really have nothing to add.  She is actively wheezing today, and therefore will give her a short course of steroids to see if we can improve things.  Would also like to see her back in 3 weeks with full pulmonary function studies.  We'll also check a chest x-ray today for completeness and she has not had one in over a year.

## 2011-06-16 NOTE — Progress Notes (Signed)
Quick Note:  Spoke with pt and notified of results per Dr. Clance. Pt verbalized understanding and denied any questions.  ______ 

## 2011-06-21 ENCOUNTER — Encounter: Payer: Self-pay | Admitting: Pulmonary Disease

## 2011-06-22 ENCOUNTER — Ambulatory Visit (INDEPENDENT_AMBULATORY_CARE_PROVIDER_SITE_OTHER): Payer: Medicare Other | Admitting: *Deleted

## 2011-06-22 DIAGNOSIS — I2699 Other pulmonary embolism without acute cor pulmonale: Secondary | ICD-10-CM

## 2011-06-22 DIAGNOSIS — Z7901 Long term (current) use of anticoagulants: Secondary | ICD-10-CM

## 2011-06-22 DIAGNOSIS — D6859 Other primary thrombophilia: Secondary | ICD-10-CM

## 2011-06-22 DIAGNOSIS — D6851 Activated protein C resistance: Secondary | ICD-10-CM

## 2011-06-29 ENCOUNTER — Encounter: Payer: Self-pay | Admitting: Pulmonary Disease

## 2011-07-05 ENCOUNTER — Encounter: Payer: Self-pay | Admitting: Pulmonary Disease

## 2011-07-05 ENCOUNTER — Ambulatory Visit (INDEPENDENT_AMBULATORY_CARE_PROVIDER_SITE_OTHER): Payer: Medicare Other | Admitting: Pulmonary Disease

## 2011-07-05 VITALS — BP 122/70 | HR 90 | Temp 98.4°F | Ht 66.0 in | Wt 205.0 lb

## 2011-07-05 DIAGNOSIS — R0609 Other forms of dyspnea: Secondary | ICD-10-CM

## 2011-07-05 DIAGNOSIS — J449 Chronic obstructive pulmonary disease, unspecified: Secondary | ICD-10-CM | POA: Diagnosis not present

## 2011-07-05 DIAGNOSIS — R0989 Other specified symptoms and signs involving the circulatory and respiratory systems: Secondary | ICD-10-CM | POA: Diagnosis not present

## 2011-07-05 LAB — PULMONARY FUNCTION TEST

## 2011-07-05 NOTE — Patient Instructions (Signed)
Continue on current medications. You must stay away from smoking.  You are doing great so far! followup with me in 4mos, but call if your breathing worsens.

## 2011-07-05 NOTE — Progress Notes (Signed)
  Subjective:    Patient ID: [name], female    DOB: [birth year], 61 y.o.   MRN: [id number]  HPI The patient comes in today for her pulmonary function studies.  She was found to have severe airflow obstruction with air trapping, but it least had a significant improvement with bronchodilators.  She was tried on a short course of prednisone at the last visit to see if it would help her breathing, and she really did not see much difference.  She is continuing on Advair and Spiriva.  She also tells me that she has not smoked since the last visit.   Review of Systems  Constitutional: Negative for fever and unexpected weight change.  HENT: Positive for congestion, rhinorrhea, sneezing and postnasal drip. Negative for ear pain, nosebleeds, sore throat, trouble swallowing, dental problem and sinus pressure.   Eyes: Negative for redness and itching.  Respiratory: Positive for cough, chest tightness, shortness of breath and wheezing.   Cardiovascular: Positive for chest pain. Negative for palpitations and leg swelling.  Gastrointestinal: Negative for nausea and vomiting.  Genitourinary: Negative for dysuria.  Musculoskeletal: Negative for joint swelling.  Skin: Negative for rash.  Neurological: Positive for dizziness, light-headedness and headaches.  Hematological: Bruises/bleeds easily.  Psychiatric/Behavioral: Positive for dysphoric mood. The patient is not nervous/anxious.        Overly nervous       Objective:   Physical Exam Obese female in no acute distress Nose without purulence or discharge noted Chest with a few rhonchi, no wheezes Cardiac exam is regular rate and rhythm Lower extremities without edema, cyanosis Alert and oriented, moves all 4 extremities.       Assessment & Plan:

## 2011-07-05 NOTE — Progress Notes (Signed)
PFT done today. 

## 2011-07-05 NOTE — Assessment & Plan Note (Signed)
The patient has severe air flow obstruction by her PFTs today, but at least she had a significant response to bronchodilators.  It is unclear how much of her airflow obstruction is fixed, and how much may improve now that she has stopped smoking.  She is on an excellent bronchodilator regimen, and I have asked her to continue.  She understands the most important treatment for her at this time his total smoking cessation.  We also checked her oxygen level with ambulation today, and she did not desaturate below 90%.

## 2011-07-07 ENCOUNTER — Telehealth: Payer: Self-pay | Admitting: Pulmonary Disease

## 2011-07-07 MED ORDER — ALBUTEROL SULFATE HFA 108 (90 BASE) MCG/ACT IN AERS: 2.0000 | INHALATION_SPRAY | RESPIRATORY_TRACT | Status: DC | PRN

## 2011-07-07 NOTE — Telephone Encounter (Signed)
Spoke with pt and advised that rx for albuterol HFA sent to pharm. She verbalized understanding and states nothing further needed.

## 2011-07-11 ENCOUNTER — Encounter: Payer: Self-pay | Admitting: Pulmonary Disease

## 2011-07-17 ENCOUNTER — Ambulatory Visit (INDEPENDENT_AMBULATORY_CARE_PROVIDER_SITE_OTHER): Payer: Medicare Other | Admitting: *Deleted

## 2011-07-17 DIAGNOSIS — I2699 Other pulmonary embolism without acute cor pulmonale: Secondary | ICD-10-CM

## 2011-07-17 DIAGNOSIS — D6859 Other primary thrombophilia: Secondary | ICD-10-CM

## 2011-07-17 DIAGNOSIS — D6851 Activated protein C resistance: Secondary | ICD-10-CM

## 2011-07-17 DIAGNOSIS — Z7901 Long term (current) use of anticoagulants: Secondary | ICD-10-CM

## 2011-07-17 LAB — POCT INR: INR: 1.5

## 2011-07-31 ENCOUNTER — Ambulatory Visit (INDEPENDENT_AMBULATORY_CARE_PROVIDER_SITE_OTHER): Payer: Medicare Other | Admitting: *Deleted

## 2011-07-31 DIAGNOSIS — Z7901 Long term (current) use of anticoagulants: Secondary | ICD-10-CM | POA: Diagnosis not present

## 2011-07-31 DIAGNOSIS — D6859 Other primary thrombophilia: Secondary | ICD-10-CM | POA: Diagnosis not present

## 2011-07-31 DIAGNOSIS — I2699 Other pulmonary embolism without acute cor pulmonale: Secondary | ICD-10-CM

## 2011-07-31 DIAGNOSIS — D6851 Activated protein C resistance: Secondary | ICD-10-CM

## 2011-08-10 ENCOUNTER — Telehealth: Payer: Self-pay | Admitting: *Deleted

## 2011-08-10 NOTE — Telephone Encounter (Signed)
Message copied by Lesle Chris on Thu Aug 10, 2011  9:45 AM ------      Message from: Lesle Chris      Created: Wed May 17, 2011  9:28 AM       FLP & LFT in 12 weeks from 05/03/2011.

## 2011-08-10 NOTE — Telephone Encounter (Signed)
See dictation in chart.  PMD (Dr. Artist Pais) following lipids.

## 2011-08-21 ENCOUNTER — Ambulatory Visit (INDEPENDENT_AMBULATORY_CARE_PROVIDER_SITE_OTHER): Payer: Medicare Other | Admitting: *Deleted

## 2011-08-21 DIAGNOSIS — D6859 Other primary thrombophilia: Secondary | ICD-10-CM

## 2011-08-21 DIAGNOSIS — Z7901 Long term (current) use of anticoagulants: Secondary | ICD-10-CM

## 2011-08-21 DIAGNOSIS — I2699 Other pulmonary embolism without acute cor pulmonale: Secondary | ICD-10-CM | POA: Diagnosis not present

## 2011-08-21 DIAGNOSIS — D6851 Activated protein C resistance: Secondary | ICD-10-CM

## 2011-08-21 LAB — POCT INR: INR: 2.6

## 2011-09-04 ENCOUNTER — Encounter: Payer: Self-pay | Admitting: Internal Medicine

## 2011-09-04 ENCOUNTER — Ambulatory Visit (INDEPENDENT_AMBULATORY_CARE_PROVIDER_SITE_OTHER): Payer: Medicare Other | Admitting: Internal Medicine

## 2011-09-04 VITALS — BP 142/72 | HR 92 | Temp 98.3°F | Wt 206.0 lb

## 2011-09-04 DIAGNOSIS — E039 Hypothyroidism, unspecified: Secondary | ICD-10-CM | POA: Diagnosis not present

## 2011-09-04 DIAGNOSIS — I2510 Atherosclerotic heart disease of native coronary artery without angina pectoris: Secondary | ICD-10-CM

## 2011-09-04 DIAGNOSIS — J449 Chronic obstructive pulmonary disease, unspecified: Secondary | ICD-10-CM | POA: Diagnosis not present

## 2011-09-04 DIAGNOSIS — I2699 Other pulmonary embolism without acute cor pulmonale: Secondary | ICD-10-CM | POA: Diagnosis not present

## 2011-09-04 MED ORDER — LOSARTAN POTASSIUM 25 MG PO TABS: 25.0000 mg | ORAL_TABLET | Freq: Every day | ORAL | Status: DC

## 2011-09-04 NOTE — Assessment & Plan Note (Signed)
Continue current dose of thyroid replacement. Monitor TFTs before next office visit.  Lab Results  Component Value Date   TSH 1.19 05/15/2011

## 2011-09-04 NOTE — Assessment & Plan Note (Addendum)
I strongly encouraged tobacco cessation. Patient to try nicotine lozenges.  Discontinue ACE inhibitor considering severe COPD. Change to losartan 25 mg once daily.

## 2011-09-04 NOTE — Assessment & Plan Note (Signed)
Warfarin management as per cardiology

## 2011-09-04 NOTE — Progress Notes (Signed)
Subjective:    Patient ID: [name], female    DOB: September 19, 1950, 61 y.o.   MRN: [id number]  HPI  61 year old white female with chronic COPD, tobacco abuse and hypothyroidism for routine followup. Patient followed by pulmonary. She is using her maintenance inhaler. However she has been unable to discontinue smoking. She complains of chronic wheezing and cough. She denies purulent sputum.  Hypothyroidism-TFTs in May of 2013 were normal. No significant weight changes.  Pulmonary embolism/Factor V [place] has had mild difficulty with achieving target INR range on warfarin.  Review of Systems Negative for chest pain  Past Medical History  Diagnosis Date  . CAD April 2011    Inferior STEMI with multiple angioplasties and stent placement  to RCA. Subsequent acute stent thrombosis 06/2010 requiring 2 separate interventions (Zeta stent, then repeat cath with thrombectomy). Cath 03/2011 stable anatomy, EF 55-60% with focal basal inferior akinesis  . DVT (deep venous thrombosis)   . Pulmonary embolism   . Factor V Leiden, prothrombin gene mutation   . Hyperlipidemia   . COPD (chronic obstructive pulmonary disease)   . OSA (obstructive sleep apnea)   . Tobacco abuse   . Hypothyroidism   . Noncompliance   . Pelvic fracture 2009    History   Social History  . Marital Status: Divorced    Spouse Name: N/A    Number of Children: N/A  . Years of Education: N/A   Occupational History  . disabled    Social History Main Topics  . Smoking status: Current Some Day Smoker -- 0.5 packs/day for 50 years    Types: Cigarettes  . Smokeless tobacco: Never Used   Comment: PT STATES SHE QUIT SMOKING 6.4.2013  . Alcohol Use: No  . Drug Use: No  . Sexually Active: Not on file   Other Topics Concern  . Not on file   Social History Narrative  . No narrative on file    Past Surgical History  Procedure Date  . Cardiac catheterization 06/2010  . Coronary angioplasty 06/2010    Family  History  Problem Relation Age of Onset  . Emphysema Mother   . Emphysema Sister   . Heart disease Father   . Factor V Leiden deficiency Father   . Factor V Leiden deficiency Sister   . Factor V Leiden deficiency Daughter   . Kidney cancer Paternal Grandmother     Allergies  Allergen Reactions  . Minocycline Hcl     REACTION: Dizzy  . Prednisone     REACTION: feels like throat swelling  . Varenicline Tartrate     REACTION: Dizzy  . Zocor (Simvastatin - High Dose) Other (See Comments)    myalgia    Current Outpatient Prescriptions on File Prior to Visit  Medication Sig Dispense Refill  . acetaminophen (TYLENOL) 500 MG tablet Take 500 mg by mouth every 8 (eight) hours as needed. For headache      . albuterol (PROVENTIL HFA;VENTOLIN HFA) 108 (90 BASE) MCG/ACT inhaler Inhale 2 puffs into the lungs every 4 (four) hours as needed. For shortness of breath  1 Inhaler  2  . fish oil-omega-3 fatty acids 1000 MG capsule Take 2 g by mouth daily.        . Fluticasone-Salmeterol (ADVAIR DISKUS) 250-50 MCG/DOSE AEPB Inhale 1 puff into the lungs every 12 (twelve) hours.        . isosorbide dinitrate (ISORDIL) 20 MG tablet Take 1 tablet (20 mg total) by mouth 2 (two) times daily.  60  tablet  6  . levothyroxine (SYNTHROID, LEVOTHROID) 125 MCG tablet Take 1 tablet (125 mcg total) by mouth daily.  30 tablet  3  . metoprolol succinate (TOPROL-XL) 50 MG 24 hr tablet Take 25 mg by mouth 2 (two) times daily.       [name] morphine (KADIAN) 50 MG 24 hr capsule Take 50 mg by mouth 2 (two) times daily.      [name] morphine (MSIR) 15 MG tablet Take 15 mg by mouth 3 (three) times daily.      . nitroGLYCERIN (NITROSTAT) 0.4 MG SL tablet Place 1 tablet (0.4 mg total) under the tongue every 5 (five) minutes as needed for chest pain.  25 tablet  2  . rosuvastatin (CRESTOR) 20 MG tablet Take 40 mg by mouth every evening.       . Ticagrelor (BRILINTA) 90 MG TABS tablet Take 1 tablet (90 mg total) by mouth 2 (two) times daily.   180 tablet  3  . tiotropium (SPIRIVA) 18 MCG inhalation capsule Place 18 mcg into inhaler and inhale daily.        [name] warfarin (COUMADIN) 5 MG tablet Take 5 mg by mouth as directed. TAKE 1 TABLETS (5mg ) DAILY, EXCEPT SUNDAYS AND WEDNESDAYS TAKES 1.5 TABLETS THOSE DAYS(7.5mg ).  MANAGED BY LISA IN [place]      . losartan (COZAAR) 25 MG tablet Take 1 tablet (25 mg total) by mouth daily.  30 tablet  5    BP 142/72  Pulse 92  Temp 98.3 F (36.8 C) (Oral)  Wt 206 lb (93.441 kg)       Objective:   Physical Exam  Constitutional: She appears well-developed and well-nourished.  HENT:  Mouth/Throat: Oropharynx is clear and moist.  Cardiovascular: Normal rate, regular rhythm and normal heart sounds.   No murmur heard. Pulmonary/Chest: Effort normal and breath sounds normal. She has no wheezes.  Abdominal: Soft. Bowel sounds are normal.  Musculoskeletal: She exhibits no edema.  Skin: Skin is dry.  Psychiatric: She has a normal mood and affect. Her behavior is normal.          Assessment & Plan:

## 2011-09-04 NOTE — Assessment & Plan Note (Signed)
Change ACE to ARB.  Continue B Blocker.  Antiplatelet agents as per cardiology.

## 2011-09-04 NOTE — Patient Instructions (Addendum)
Please complete the following lab tests before your next follow up appointment: BMET - 401.9, TSH - 244.9

## 2011-09-11 ENCOUNTER — Other Ambulatory Visit: Payer: Self-pay | Admitting: Internal Medicine

## 2011-09-14 ENCOUNTER — Ambulatory Visit (INDEPENDENT_AMBULATORY_CARE_PROVIDER_SITE_OTHER): Payer: Medicare Other | Admitting: *Deleted

## 2011-09-14 DIAGNOSIS — Z7901 Long term (current) use of anticoagulants: Secondary | ICD-10-CM | POA: Diagnosis not present

## 2011-09-14 DIAGNOSIS — D6859 Other primary thrombophilia: Secondary | ICD-10-CM

## 2011-09-14 DIAGNOSIS — I2699 Other pulmonary embolism without acute cor pulmonale: Secondary | ICD-10-CM

## 2011-09-14 DIAGNOSIS — D6851 Activated protein C resistance: Secondary | ICD-10-CM

## 2011-09-14 DIAGNOSIS — Z23 Encounter for immunization: Secondary | ICD-10-CM | POA: Diagnosis not present

## 2011-09-14 LAB — POCT INR: INR: 1.9

## 2011-10-04 ENCOUNTER — Other Ambulatory Visit: Payer: Self-pay | Admitting: Pulmonary Disease

## 2011-10-09 ENCOUNTER — Ambulatory Visit (INDEPENDENT_AMBULATORY_CARE_PROVIDER_SITE_OTHER): Payer: Medicare Other | Admitting: *Deleted

## 2011-10-09 DIAGNOSIS — Z7901 Long term (current) use of anticoagulants: Secondary | ICD-10-CM | POA: Diagnosis not present

## 2011-10-09 DIAGNOSIS — I2699 Other pulmonary embolism without acute cor pulmonale: Secondary | ICD-10-CM

## 2011-10-09 DIAGNOSIS — D6859 Other primary thrombophilia: Secondary | ICD-10-CM | POA: Diagnosis not present

## 2011-10-09 DIAGNOSIS — D6851 Activated protein C resistance: Secondary | ICD-10-CM

## 2011-10-09 LAB — POCT INR: INR: 2.3

## 2011-10-30 ENCOUNTER — Encounter: Payer: Self-pay | Admitting: Pulmonary Disease

## 2011-10-30 ENCOUNTER — Ambulatory Visit (INDEPENDENT_AMBULATORY_CARE_PROVIDER_SITE_OTHER): Payer: Medicare Other | Admitting: Pulmonary Disease

## 2011-10-30 VITALS — BP 120/80 | HR 88 | Temp 98.2°F | Ht 66.0 in | Wt 205.6 lb

## 2011-10-30 DIAGNOSIS — J449 Chronic obstructive pulmonary disease, unspecified: Secondary | ICD-10-CM

## 2011-10-30 MED ORDER — TIOTROPIUM BROMIDE MONOHYDRATE 18 MCG IN CAPS: 18.0000 ug | ORAL_CAPSULE | Freq: Every day | RESPIRATORY_TRACT | Status: DC

## 2011-10-30 MED ORDER — FLUTICASONE-SALMETEROL 250-50 MCG/DOSE IN AEPB: 1.0000 | INHALATION_SPRAY | Freq: Two times a day (BID) | RESPIRATORY_TRACT | Status: DC

## 2011-10-30 MED ORDER — ALBUTEROL SULFATE HFA 108 (90 BASE) MCG/ACT IN AERS: 2.0000 | INHALATION_SPRAY | RESPIRATORY_TRACT | Status: DC | PRN

## 2011-10-30 NOTE — Patient Instructions (Addendum)
No change in your breathing medications. Stop smoking.  This is the only way for you to get better. followup with me in 6 months, but call if having worsening of your breathing.

## 2011-10-30 NOTE — Progress Notes (Signed)
  Subjective:    Patient ID: [name], female    DOB: 05/31/1950, 61 y.o.   MRN: [id number]  HPI The patient comes in today for followup of her known COPD.  She is staying on her bronchodilator regimen, but unfortunately has continued to smoke off and on.  She does have cough and some discolored mucus at times, but does not feel like she has a chest cold.  Her breathing is near her usual baseline.   Review of Systems  Constitutional: Negative for fever and unexpected weight change.  HENT: Positive for congestion, rhinorrhea, sneezing, trouble swallowing, postnasal drip and sinus pressure. Negative for ear pain, nosebleeds, sore throat and dental problem.   Eyes: Negative for redness and itching.  Respiratory: Positive for cough, chest tightness, shortness of breath and wheezing.   Cardiovascular: Negative for palpitations and leg swelling.  Gastrointestinal: Negative for nausea and vomiting.  Genitourinary: Negative for dysuria.  Musculoskeletal: Negative for joint swelling.  Skin: Positive for rash.  Neurological: Positive for headaches.  Hematological: Bruises/bleeds easily.  Psychiatric/Behavioral: Negative for dysphoric mood. The patient is not nervous/anxious.        Objective:   Physical Exam Overweight female in no acute distress Nose without purulence or discharge noted Chest with decreased breath sounds, but no wheezes or rhonchi Cardiac exam with regular rate and rhythm Lower extremities with 1+ edema, varicosities, but no cyanosis. Alert and oriented, moves all 4 extremities.       Assessment & Plan:

## 2011-10-30 NOTE — Assessment & Plan Note (Signed)
The patient overall is stable, but she continues to have some cough and congestion that are related to her ongoing smoking.  I have told her that she is on an excellent medical regimen, and that she must stop smoking in order to improve.  I also asked her to work on weight loss and conditioning.

## 2011-10-30 NOTE — Addendum Note (Signed)
Addended by: Alecia Lemming on: 10/30/2011 03:43 PM   Modules accepted: Orders

## 2011-11-06 ENCOUNTER — Ambulatory Visit: Payer: Medicare Other | Admitting: Pulmonary Disease

## 2011-11-09 ENCOUNTER — Ambulatory Visit (INDEPENDENT_AMBULATORY_CARE_PROVIDER_SITE_OTHER): Payer: Medicare Other | Admitting: Cardiology

## 2011-11-09 ENCOUNTER — Encounter: Payer: Self-pay | Admitting: Cardiology

## 2011-11-09 ENCOUNTER — Ambulatory Visit (INDEPENDENT_AMBULATORY_CARE_PROVIDER_SITE_OTHER): Payer: Medicare Other | Admitting: *Deleted

## 2011-11-09 VITALS — BP 100/54 | HR 72 | Ht 66.0 in | Wt 203.0 lb

## 2011-11-09 DIAGNOSIS — F17200 Nicotine dependence, unspecified, uncomplicated: Secondary | ICD-10-CM

## 2011-11-09 DIAGNOSIS — D6859 Other primary thrombophilia: Secondary | ICD-10-CM

## 2011-11-09 DIAGNOSIS — I2699 Other pulmonary embolism without acute cor pulmonale: Secondary | ICD-10-CM

## 2011-11-09 DIAGNOSIS — Z7901 Long term (current) use of anticoagulants: Secondary | ICD-10-CM | POA: Diagnosis not present

## 2011-11-09 DIAGNOSIS — E785 Hyperlipidemia, unspecified: Secondary | ICD-10-CM

## 2011-11-09 DIAGNOSIS — I7090 Unspecified atherosclerosis: Secondary | ICD-10-CM | POA: Diagnosis not present

## 2011-11-09 DIAGNOSIS — J449 Chronic obstructive pulmonary disease, unspecified: Secondary | ICD-10-CM

## 2011-11-09 DIAGNOSIS — R0989 Other specified symptoms and signs involving the circulatory and respiratory systems: Secondary | ICD-10-CM | POA: Diagnosis not present

## 2011-11-09 DIAGNOSIS — G4733 Obstructive sleep apnea (adult) (pediatric): Secondary | ICD-10-CM | POA: Insufficient documentation

## 2011-11-09 DIAGNOSIS — I2510 Atherosclerotic heart disease of native coronary artery without angina pectoris: Secondary | ICD-10-CM

## 2011-11-09 DIAGNOSIS — Z9861 Coronary angioplasty status: Secondary | ICD-10-CM | POA: Insufficient documentation

## 2011-11-09 DIAGNOSIS — E039 Hypothyroidism, unspecified: Secondary | ICD-10-CM

## 2011-11-09 DIAGNOSIS — D6851 Activated protein C resistance: Secondary | ICD-10-CM | POA: Insufficient documentation

## 2011-11-09 MED ORDER — ATORVASTATIN CALCIUM 40 MG PO TABS: 40.0000 mg | ORAL_TABLET | Freq: Every day | ORAL | Status: DC

## 2011-11-09 NOTE — Patient Instructions (Addendum)
Your physician recommends that you schedule a follow-up appointment in:  1 - 1 month for a blood pressure check Your physician has requested that you regularly monitor and record your blood pressure readings at home. Please use the same machine at the same time of day to check your readings and record them to bring to your follow-up visit. 2 - 6 months with Dr Dietrich Pates  Your physician has recommended you make the following change in your medication:  1 - FINISH current bottle of Crestor, then START Atorvastatin 40 mg daily thereafter 2 - STOP Metoprolol 3 - STOP Isosorbide  Your physician has requested that you have a carotid duplex. This test is an ultrasound of the carotid arteries in your neck. It looks at blood flow through these arteries that supply the brain with blood. Allow one hour for this exam. There are no restrictions or special instructions.  Your physician recommends that you return for lab work in: 1 month (you will receive a letter in the mail)

## 2011-11-09 NOTE — Progress Notes (Signed)
Patient ID: [name], female   DOB: [birth year], 61 y.o.   MRN: [id number]  HPI: Scheduled return visit for this nice woman who was previously treated by Dr. Andee Lineman, but now wishes to switch to my practice.  She has a long history of coronary artery disease, which dates back 12 years.  Principle problems have been with the right coronary.  She initially presented with a non-Q MI, was found to have subtotal obstruction of the RCA and underwent stent implantation.  She required a cutting balloon and brachytherapy for in-stent restenosis in 2003 and a stent and subsequent thrombectomy for stent thrombosis in 2012.  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  Prior to Admission medications   Medication Sig Start Date End Date Taking? Authorizing Provider  acetaminophen (TYLENOL) 500 MG tablet Take 500 mg by mouth every 8 (eight) hours as needed. For headache   Yes Historical Provider, MD  albuterol (VENTOLIN HFA) 108 (90 BASE) MCG/ACT inhaler Inhale 2 puffs into the lungs every 4 (four) hours as needed for wheezing. 10/30/11  Yes Barbaraann Share, MD  fish oil-omega-3 fatty acids 1000 MG capsule Take 2 g by mouth daily.     Yes Historical Provider, MD  Fluticasone-Salmeterol (ADVAIR DISKUS) 250-50 MCG/DOSE AEPB Inhale 1 puff into the lungs every 12 (twelve) hours. 10/30/11  Yes Barbaraann Share, MD  levothyroxine (SYNTHROID, LEVOTHROID) 125 MCG tablet TAKE ONE TABLET BY MOUTH EVERY DAY.**FOLLOW UP WITH YOUR PRIMARY DOCTOR FOR THIS MEDICATION.** 09/11/11  Yes Doe-Hyun R Artist Pais, DO  losartan (COZAAR) 25 MG tablet Take 1 tablet (25 mg total) by mouth daily. 09/04/11 09/03/12 Yes Doe-Hyun R Artist Pais, DO  morphine (KADIAN) 50 MG 24 hr capsule Take 50 mg by mouth 2 (two) times daily.   Yes Historical Provider, MD  morphine (MSIR) 15 MG tablet Take 15 mg by mouth 3 (three) times daily.   Yes Historical Provider, MD  nitroGLYCERIN (NITROSTAT) 0.4 MG SL tablet Place 1  tablet (0.4 mg total) under the tongue every 5 (five) minutes as needed for chest pain. 05/01/11  Yes Prescott Parma, PA  Ticagrelor (BRILINTA) 90 MG TABS tablet Take 1 tablet (90 mg total) by mouth 2 (two) times daily. 04/18/11  Yes June Leap, MD  tiotropium (SPIRIVA) 18 MCG inhalation capsule Place 1 capsule (18 mcg total) into inhaler and inhale daily. 10/30/11  Yes Barbaraann Share, MD  warfarin (COUMADIN) 5 MG tablet Take 5 mg by mouth as directed. TAKE 1 TABLETS (5mg ) DAILY, EXCEPT SUNDAYS AND WEDNESDAYS TAKES 1.5 TABLETS THOSE DAYS(7.5mg ).  MANAGED BY LISA IN [place]   Yes Historical Provider, MD  atorvastatin (LIPITOR) 40 MG tablet Take 1 tablet (40 mg total) by mouth daily. 11/09/11   Kathlen Brunswick, MD   Allergies  Allergen Reactions  . Minocycline Hcl     REACTION: Dizzy  . Prednisone     REACTION: feels like throat swelling  . Varenicline Tartrate     REACTION: Dizzy(chantix)   . Zocor (Simvastatin - High Dose) Other (See Comments)    myalgia     Past medical history, social history, and family history reviewed and updated.  ROS: Denies chest discomfort, dyspnea, orthopnea, PND, palpitations, lightheadedness or syncope.  She underwent colonoscopy many years ago, perhaps more than 10.  She has never had anemia or evidence of GI bleeding.  She is disabled as a result of traumatic injuries suffered when she fell from  her truck.  PHYSICAL EXAM: BP 100/54  Pulse 72  Ht 5\' 6"  (1.676 m)  Wt 92.08 kg (203 lb)  BMI 32.76 kg/m2  SpO2 92%  General-Well developed; no acute distress Body habitus-proportionate weight and height Neck-No JVD; no carotid bruits Lungs-clear lung fields; resonant to percussion Cardiovascular-normal PMI; normal S1 and S2 Abdomen-normal bowel sounds; soft and non-tender without masses or organomegaly Musculoskeletal-No deformities, no cyanosis or clubbing Neurologic-Normal cranial nerves; symmetric strength and tone Skin-Warm, no significant  lesions Extremities-distal pulses intact; Trace edema; scar over the dorsal right wrist  ASSESSMENT AND PLAN:  [place], MD 11/09/2011 2:31 PM

## 2011-11-09 NOTE — Assessment & Plan Note (Addendum)
PFTs obtained in 2013 suggesting moderate to severe disease.  There may be some improvement if patient continues to refrain from tobacco use.  Metoprolol discontinued to determine if this has beneficial effect on respiratory status.

## 2011-11-09 NOTE — Assessment & Plan Note (Signed)
Hypothyroidism has been adequately treated.

## 2011-11-09 NOTE — Assessment & Plan Note (Addendum)
Profile good when last assessed.  To decrease the cost of patient's medications, atorvastatin will be substituted for rosuvastatin with repeat lipids obtained in a few months.

## 2011-11-09 NOTE — Assessment & Plan Note (Addendum)
Patient has done well with respect to coronary disease since intervention more than one year ago.  As the result of a history of late stent thrombosis while being treated with warfarin and unprovoked pulmonary embolism, treatment with a platelet inhibitor plus an oral anticoagulant will be continued.  Metoprolol will be discontinued to determine if doing so results in improvement in chronic obstructive pulmonary disease Albuterol, especially if delivered via nebulizer, might be adequately effective and significantly less expensive than Advair.

## 2011-11-09 NOTE — Assessment & Plan Note (Signed)
Patient congratulated on not smoking for the past few months.  She previously stopped for 4 months and thus does not consider herself cured.  She is encouraged to continue to refrain from use of tobacco products.

## 2011-11-09 NOTE — Progress Notes (Deleted)
Name: [name]    DOB: 09/05/1950  Age: 61 y.o.  MR#: [id number]       PCP:  Thomos Lemons, DO      Insurance: @PAYORNAME @   CC:   No chief complaint on file.  MEDICATION LIST  VS BP 100/54  Pulse 72  Ht 5\' 6"  (1.676 m)  Wt 203 lb (92.08 kg)  BMI 32.76 kg/m2  SpO2 92%  Weights Current Weight  11/09/11 203 lb (92.08 kg)  10/30/11 205 lb 9.6 oz (93.26 kg)  09/04/11 206 lb (93.441 kg)    Blood Pressure  BP Readings from Last 3 Encounters:  11/09/11 100/54  10/30/11 120/80  09/04/11 142/72     Admit date:  (Not on file) Last encounter with RMR:  Visit date not found   Allergy Allergies  Allergen Reactions  . Minocycline Hcl     REACTION: Dizzy  . Prednisone     REACTION: feels like throat swelling  . Varenicline Tartrate     REACTION: Dizzy(chantix)   . Zocor (Simvastatin - High Dose) Other (See Comments)    myalgia    Current Outpatient Prescriptions  Medication Sig Dispense Refill  . acetaminophen (TYLENOL) 500 MG tablet Take 500 mg by mouth every 8 (eight) hours as needed. For headache      . albuterol (VENTOLIN HFA) 108 (90 BASE) MCG/ACT inhaler Inhale 2 puffs into the lungs every 4 (four) hours as needed for wheezing.  18 g  2  . fish oil-omega-3 fatty acids 1000 MG capsule Take 2 g by mouth daily.        . Fluticasone-Salmeterol (ADVAIR DISKUS) 250-50 MCG/DOSE AEPB Inhale 1 puff into the lungs every 12 (twelve) hours.  60 each  5  . isosorbide dinitrate (ISORDIL) 20 MG tablet Take 1 tablet (20 mg total) by mouth 2 (two) times daily.  60 tablet  6  . levothyroxine (SYNTHROID, LEVOTHROID) 125 MCG tablet TAKE ONE TABLET BY MOUTH EVERY DAY.**FOLLOW UP WITH YOUR PRIMARY DOCTOR FOR THIS MEDICATION.**  30 tablet  5  . losartan (COZAAR) 25 MG tablet Take 1 tablet (25 mg total) by mouth daily.  30 tablet  5  . metoprolol succinate (TOPROL-XL) 50 MG 24 hr tablet Take 25 mg by mouth 2 (two) times daily.       [name] morphine (KADIAN) 50 MG 24 hr capsule Take 50 mg by mouth 2  (two) times daily.      [name] morphine (MSIR) 15 MG tablet Take 15 mg by mouth 3 (three) times daily.      . nitroGLYCERIN (NITROSTAT) 0.4 MG SL tablet Place 1 tablet (0.4 mg total) under the tongue every 5 (five) minutes as needed for chest pain.  25 tablet  2  . rosuvastatin (CRESTOR) 20 MG tablet Take 40 mg by mouth every evening.       . Ticagrelor (BRILINTA) 90 MG TABS tablet Take 1 tablet (90 mg total) by mouth 2 (two) times daily.  180 tablet  3  . tiotropium (SPIRIVA) 18 MCG inhalation capsule Place 1 capsule (18 mcg total) into inhaler and inhale daily.  30 capsule  5  . warfarin (COUMADIN) 5 MG tablet Take 5 mg by mouth as directed. TAKE 1 TABLETS (5mg ) DAILY, EXCEPT SUNDAYS AND WEDNESDAYS TAKES 1.5 TABLETS THOSE DAYS(7.5mg ).  MANAGED BY LISA IN [place]        Discontinued Meds:   There are no discontinued medications.  Patient Active Problem List  Diagnosis  . HYPOTHYROIDISM  .  TOBACCO ABUSE  . PULMONARY EMBOLISM  . Chronic obstructive pulmonary disease  . SYSTEMIC LUPUS ERYTHEMATOSUS, HX OF  . Arteriosclerotic cardiovascular disease (ASCVD)  . Factor V Leiden, prothrombin gene mutation  . Hyperlipidemia  . OSA (obstructive sleep apnea)  . Chronic anticoagulation    LABS Anti-coag visit on 10/09/2011  Component Date Value  . INR 11/09/2011 2.6   Anti-coag visit on 09/14/2011  Component Date Value  . INR 10/09/2011 2.3   Anti-coag visit on 08/21/2011  Component Date Value  . INR 09/14/2011 1.9      Results for this Opt Visit:     Results for orders placed in visit on 10/09/11  POCT INR      Component Value Range   INR 2.6      EKG Orders placed during the hospital encounter of 03/31/11  . EKG 12-LEAD  . EKG 12-LEAD  . EKG     Prior Assessment and Plan Problem List as of 11/09/2011            Cardiology Problems   PULMONARY EMBOLISM   Last Assessment & Plan Note   09/04/2011 Office Visit Signed 09/04/2011 10:48 PM by Meda Coffee, DO    Warfarin  management as per cardiology    Arteriosclerotic cardiovascular disease (ASCVD)   Hyperlipidemia     Other   HYPOTHYROIDISM   Last Assessment & Plan Note   09/04/2011 Office Visit Signed 09/04/2011 10:47 PM by Meda Coffee, DO     Continue current dose of thyroid replacement. Monitor TFTs before next office visit.  Lab Results  Component Value Date   TSH 1.19 05/15/2011       TOBACCO ABUSE   Last Assessment & Plan Note   05/15/2011 Office Visit Signed 05/15/2011  4:51 PM by Meda Coffee, DO    The patient unable to tolerate Chantix or nicotine patches in the past. Patient advised to use nicotine lozenges as needed.    Chronic obstructive pulmonary disease   Last Assessment & Plan Note   10/30/2011 Office Visit Signed 10/30/2011  3:10 PM by Barbaraann Share, MD    The patient overall is stable, but she continues to have some cough and congestion that are related to her ongoing smoking.  I have told her that she is on an excellent medical regimen, and that she must stop smoking in order to improve.  I also asked her to work on weight loss and conditioning.    SYSTEMIC LUPUS ERYTHEMATOSUS, HX OF   Factor V Leiden, prothrombin gene mutation   OSA (obstructive sleep apnea)   Chronic anticoagulation       Imaging: No results found.   FRS Calculation: Score not calculated. Missing: Total Cholesterol

## 2011-11-10 ENCOUNTER — Encounter: Payer: Self-pay | Admitting: *Deleted

## 2011-11-10 ENCOUNTER — Other Ambulatory Visit: Payer: Self-pay | Admitting: *Deleted

## 2011-11-14 ENCOUNTER — Ambulatory Visit (HOSPITAL_COMMUNITY)
Admission: RE | Admit: 2011-11-14 | Discharge: 2011-11-14 | Disposition: A | Discharge status: Home / Self Care | Payer: Medicare Other | Source: Ambulatory Visit | Attending: Cardiology | Admitting: Cardiology

## 2011-11-14 DIAGNOSIS — I6529 Occlusion and stenosis of unspecified carotid artery: Secondary | ICD-10-CM | POA: Diagnosis not present

## 2011-11-14 DIAGNOSIS — R0989 Other specified symptoms and signs involving the circulatory and respiratory systems: Secondary | ICD-10-CM | POA: Diagnosis not present

## 2011-11-15 ENCOUNTER — Encounter: Payer: Self-pay | Admitting: Cardiology

## 2011-11-15 DIAGNOSIS — I679 Cerebrovascular disease, unspecified: Secondary | ICD-10-CM | POA: Insufficient documentation

## 2011-12-11 ENCOUNTER — Ambulatory Visit (INDEPENDENT_AMBULATORY_CARE_PROVIDER_SITE_OTHER): Payer: Medicare Other | Admitting: *Deleted

## 2011-12-11 DIAGNOSIS — Z7901 Long term (current) use of anticoagulants: Secondary | ICD-10-CM | POA: Diagnosis not present

## 2011-12-11 DIAGNOSIS — D6851 Activated protein C resistance: Secondary | ICD-10-CM

## 2011-12-11 DIAGNOSIS — D6859 Other primary thrombophilia: Secondary | ICD-10-CM | POA: Diagnosis not present

## 2011-12-11 DIAGNOSIS — I2699 Other pulmonary embolism without acute cor pulmonale: Secondary | ICD-10-CM | POA: Diagnosis not present

## 2011-12-18 ENCOUNTER — Other Ambulatory Visit: Payer: Self-pay | Admitting: *Deleted

## 2011-12-18 ENCOUNTER — Ambulatory Visit (INDEPENDENT_AMBULATORY_CARE_PROVIDER_SITE_OTHER): Payer: Medicare Other | Admitting: *Deleted

## 2011-12-18 VITALS — BP 140/88 | HR 98 | Ht 66.0 in | Wt 197.4 lb

## 2011-12-18 DIAGNOSIS — I10 Essential (primary) hypertension: Secondary | ICD-10-CM | POA: Diagnosis not present

## 2011-12-18 MED ORDER — WARFARIN SODIUM 5 MG PO TABS: 5.0000 mg | ORAL_TABLET | ORAL | Status: DC

## 2011-12-18 NOTE — Progress Notes (Signed)
Presents for blood pressure check.  Medication list reviewed.  BP 100/54 HR 72 on 11/09/11, where Metoprolol and Isosorbide was discontinued.  Denies complaints.

## 2011-12-19 NOTE — Progress Notes (Signed)
Patient ID: [name], female   DOB: [birth year], 61 y.o.   MRN: [id number]  Continue current Rx.

## 2011-12-19 NOTE — Progress Notes (Signed)
Noted  

## 2011-12-21 ENCOUNTER — Other Ambulatory Visit: Payer: Self-pay | Admitting: *Deleted

## 2011-12-21 MED ORDER — LOSARTAN POTASSIUM 25 MG PO TABS: 25.0000 mg | ORAL_TABLET | Freq: Every day | ORAL | Status: DC

## 2011-12-21 MED ORDER — TICAGRELOR 90 MG PO TABS: 90.0000 mg | ORAL_TABLET | Freq: Two times a day (BID) | ORAL | Status: DC

## 2011-12-21 MED ORDER — ATORVASTATIN CALCIUM 40 MG PO TABS: 40.0000 mg | ORAL_TABLET | Freq: Every day | ORAL | Status: DC

## 2012-01-11 ENCOUNTER — Telehealth: Payer: Self-pay | Admitting: Cardiology

## 2012-01-11 NOTE — Telephone Encounter (Signed)
Patient no showed appointment.  Rescheduled appointment with patient for 01/15/12. / tgs

## 2012-01-15 ENCOUNTER — Ambulatory Visit (INDEPENDENT_AMBULATORY_CARE_PROVIDER_SITE_OTHER): Payer: Medicare Other | Admitting: *Deleted

## 2012-01-15 DIAGNOSIS — I2699 Other pulmonary embolism without acute cor pulmonale: Secondary | ICD-10-CM

## 2012-01-15 DIAGNOSIS — D6859 Other primary thrombophilia: Secondary | ICD-10-CM

## 2012-01-15 DIAGNOSIS — D6851 Activated protein C resistance: Secondary | ICD-10-CM

## 2012-01-15 DIAGNOSIS — Z7901 Long term (current) use of anticoagulants: Secondary | ICD-10-CM

## 2012-02-03 ENCOUNTER — Other Ambulatory Visit: Payer: Self-pay | Admitting: Pulmonary Disease

## 2012-02-09 ENCOUNTER — Telehealth: Payer: Self-pay | Admitting: Pulmonary Disease

## 2012-02-09 MED ORDER — ALBUTEROL SULFATE HFA 108 (90 BASE) MCG/ACT IN AERS: 2.0000 | INHALATION_SPRAY | RESPIRATORY_TRACT | Status: DC | PRN

## 2012-02-09 NOTE — Telephone Encounter (Signed)
Rx has been sent in, pt is aware. 

## 2012-02-14 ENCOUNTER — Ambulatory Visit (INDEPENDENT_AMBULATORY_CARE_PROVIDER_SITE_OTHER): Payer: Medicare Other | Admitting: *Deleted

## 2012-02-14 DIAGNOSIS — D6859 Other primary thrombophilia: Secondary | ICD-10-CM | POA: Diagnosis not present

## 2012-02-14 DIAGNOSIS — Z7901 Long term (current) use of anticoagulants: Secondary | ICD-10-CM

## 2012-02-14 DIAGNOSIS — I2699 Other pulmonary embolism without acute cor pulmonale: Secondary | ICD-10-CM | POA: Diagnosis not present

## 2012-02-14 DIAGNOSIS — D6851 Activated protein C resistance: Secondary | ICD-10-CM

## 2012-02-14 LAB — POCT INR: INR: 2.3

## 2012-02-26 ENCOUNTER — Telehealth: Payer: Self-pay | Admitting: *Deleted

## 2012-03-07 ENCOUNTER — Other Ambulatory Visit (INDEPENDENT_AMBULATORY_CARE_PROVIDER_SITE_OTHER): Payer: Medicare Other

## 2012-03-07 DIAGNOSIS — E039 Hypothyroidism, unspecified: Secondary | ICD-10-CM | POA: Diagnosis not present

## 2012-03-07 LAB — BASIC METABOLIC PANEL
BUN: 24 mg/dL — ABNORMAL HIGH (ref 6–23)
CO2: 25 mEq/L (ref 19–32)
Chloride: 107 mEq/L (ref 96–112)
Creatinine, Ser: 0.8 mg/dL (ref 0.4–1.2)

## 2012-03-13 ENCOUNTER — Ambulatory Visit (INDEPENDENT_AMBULATORY_CARE_PROVIDER_SITE_OTHER): Payer: Medicare Other | Admitting: *Deleted

## 2012-03-13 DIAGNOSIS — D6859 Other primary thrombophilia: Secondary | ICD-10-CM

## 2012-03-13 DIAGNOSIS — I2699 Other pulmonary embolism without acute cor pulmonale: Secondary | ICD-10-CM

## 2012-03-13 DIAGNOSIS — Z7901 Long term (current) use of anticoagulants: Secondary | ICD-10-CM | POA: Diagnosis not present

## 2012-03-14 NOTE — Telephone Encounter (Signed)
error 

## 2012-03-21 ENCOUNTER — Ambulatory Visit (INDEPENDENT_AMBULATORY_CARE_PROVIDER_SITE_OTHER): Payer: Medicare Other | Admitting: Internal Medicine

## 2012-03-21 ENCOUNTER — Encounter: Payer: Self-pay | Admitting: Internal Medicine

## 2012-03-21 VITALS — BP 114/68 | HR 72 | Temp 97.9°F | Wt 192.0 lb

## 2012-03-21 DIAGNOSIS — R634 Abnormal weight loss: Secondary | ICD-10-CM

## 2012-03-21 DIAGNOSIS — E039 Hypothyroidism, unspecified: Secondary | ICD-10-CM | POA: Diagnosis not present

## 2012-03-21 DIAGNOSIS — R059 Cough, unspecified: Secondary | ICD-10-CM

## 2012-03-21 DIAGNOSIS — R630 Anorexia: Secondary | ICD-10-CM

## 2012-03-21 DIAGNOSIS — R053 Chronic cough: Secondary | ICD-10-CM

## 2012-03-21 DIAGNOSIS — F17200 Nicotine dependence, unspecified, uncomplicated: Secondary | ICD-10-CM

## 2012-03-21 MED ORDER — LEVOTHYROXINE SODIUM 125 MCG PO TABS: 125.0000 ug | ORAL_TABLET | Freq: Every day | ORAL | Status: DC

## 2012-03-21 NOTE — Assessment & Plan Note (Signed)
62 year old white female with history of tobacco use complains of unexplained weight loss or anorexia. Symptoms concerning for  lung cancer. Obtain CT of chest with contrast.

## 2012-03-21 NOTE — Assessment & Plan Note (Signed)
Complete tobacco cessation encouraged.

## 2012-03-21 NOTE — Assessment & Plan Note (Signed)
Patient is euthyroid. Continue same dose of thyroid replacement.  Lab Results  Component Value Date   TSH 1.86 03/07/2012

## 2012-03-21 NOTE — Progress Notes (Signed)
Subjective:    Patient ID: [name], female    DOB: 1950/05/21, 62 y.o.   MRN: [id number]  HPI  62 year-old with chronic COPD, tobacco use and hypothyroidism for followup. Since previous visit, patient evaluated by a pulmonary specialist. Patient reports her coughing is actually worse. This may be related to her stopping chronic pain medications.  Patient also complains of significant weight loss anorexia over last 2 or 3 months. She reports losing 30 pounds been gaining 10 pounds back. She continues to have issues with poor appetite.  Patient also seen by new cardiologist. She reports her medications were changed but she is unsure of exact changes.  Review of Systems Positive for chronic cough, negative for chest pain  Past Medical History  Diagnosis Date  . Arteriosclerotic cardiovascular disease (ASCVD) 2002    Inf STEMI-2002. 2003-cutting balloon + brachytherapy for restenosis; subsequent acute stent thrombosis 06/2010 requiring 2 separate interventions (Zeta stent, then repeat cath with thrombectomy). focal basal inf AK, nl EF; 03/2011: Patent stents, minor nonobst  residual dz, nl EF; neg stress nuclear in 2008 and stress echo in 2009  . Pulmonary embolism 2006    Associated with deep vein thrombosis-2006; + factor V Leiden  . Factor V Leiden, prothrombin gene mutation 2006  . Hyperlipidemia   . COPD (chronic obstructive pulmonary disease)   . OSA (obstructive sleep apnea)   . Tobacco abuse     50 pack years  . Hypothyroidism   . Noncompliance   . Pelvic fracture 2009  . Chronic anticoagulation     Warfarin plus ticagrelor    History   Social History  . Marital Status: Divorced    Spouse Name: N/A    Number of Children: N/A  . Years of Education: N/A   Occupational History  . Disabled   . Truck driver    Social History Main Topics  . Smoking status: Current Some Day Smoker -- 1.00 packs/day for 50 years    Types: Cigarettes  . Smokeless tobacco: Never Used   . Alcohol Use: No  . Drug Use: No  . Sexually Active: Not on file   Other Topics Concern  . Not on file   Social History Narrative  . No narrative on file    Past Surgical History  Procedure Laterality Date  . Coronary angioplasty  2002, 2003, 2012  . Colonoscopy  Approximately 2000    Negative screening study    Family History  Problem Relation Age of Onset  . Emphysema Mother   . Emphysema Sister   . Heart disease Father   . Factor V Leiden deficiency Father   . Factor V Leiden deficiency Sister   . Factor V Leiden deficiency Daughter   . Kidney cancer Paternal Grandmother     Allergies  Allergen Reactions  . Minocycline Hcl     REACTION: Dizzy  . Prednisone     REACTION: feels like throat swelling  . Varenicline Tartrate     REACTION: Dizzy(chantix)   . Zocor (Simvastatin - High Dose) Other (See Comments)    myalgia    Current Outpatient Prescriptions on File Prior to Visit  Medication Sig Dispense Refill  . albuterol (VENTOLIN HFA) 108 (90 BASE) MCG/ACT inhaler Inhale 2 puffs into the lungs every 4 (four) hours as needed for wheezing.  18 g  2  . fish oil-omega-3 fatty acids 1000 MG capsule Take 2 g by mouth daily.        . Fluticasone-Salmeterol (ADVAIR DISKUS)  250-50 MCG/DOSE AEPB Inhale 1 puff into the lungs every 12 (twelve) hours.  60 each  5  . nitroGLYCERIN (NITROSTAT) 0.4 MG SL tablet Place 1 tablet (0.4 mg total) under the tongue every 5 (five) minutes as needed for chest pain.  25 tablet  2  . Ticagrelor (BRILINTA) 90 MG TABS tablet Take 1 tablet (90 mg total) by mouth 2 (two) times daily.  180 tablet  3  . tiotropium (SPIRIVA) 18 MCG inhalation capsule Place 1 capsule (18 mcg total) into inhaler and inhale daily.  30 capsule  5  . warfarin (COUMADIN) 5 MG tablet Take 1 tablet (5 mg total) by mouth as directed. TAKE 5 mg daily except 7.5 mg on Sundays and Wednesdays  45 tablet  3   No current facility-administered medications on file prior to visit.     BP 114/68  Pulse 72  Temp(Src) 97.9 F (36.6 C) (Oral)  Wt 192 lb (87.091 kg)  BMI 31 kg/m2       Objective:   Physical Exam  Constitutional: She is oriented to person, place, and time. She appears well-developed and well-nourished.  HENT:  Head: Normocephalic.  Mouth/Throat: Oropharynx is clear and moist.  Cardiovascular: Normal rate, regular rhythm and normal heart sounds.   Pulmonary/Chest: Effort normal. She has no wheezes.  Prolonged expiration  Neurological: She is alert and oriented to person, place, and time. No cranial nerve deficit.  Skin: Skin is warm and dry.  Psychiatric: She has a normal mood and affect. Her behavior is normal.          Assessment & Plan:

## 2012-03-27 ENCOUNTER — Ambulatory Visit (INDEPENDENT_AMBULATORY_CARE_PROVIDER_SITE_OTHER)
Admission: RE | Admit: 2012-03-27 | Discharge: 2012-03-27 | Disposition: A | Discharge status: Home / Self Care | Payer: Medicare Other | Source: Ambulatory Visit | Attending: Internal Medicine | Admitting: Internal Medicine

## 2012-03-27 DIAGNOSIS — R634 Abnormal weight loss: Secondary | ICD-10-CM

## 2012-03-27 DIAGNOSIS — R630 Anorexia: Secondary | ICD-10-CM

## 2012-03-27 DIAGNOSIS — R053 Chronic cough: Secondary | ICD-10-CM

## 2012-03-27 MED ORDER — IOHEXOL 300 MG/ML  SOLN
80.0000 mL | Freq: Once | INTRAMUSCULAR | Status: AC | PRN
  Administered 2012-03-27: 80 mL via INTRAVENOUS

## 2012-04-03 ENCOUNTER — Ambulatory Visit (INDEPENDENT_AMBULATORY_CARE_PROVIDER_SITE_OTHER): Payer: Medicare Other | Admitting: *Deleted

## 2012-04-03 DIAGNOSIS — D6851 Activated protein C resistance: Secondary | ICD-10-CM

## 2012-04-03 DIAGNOSIS — I2699 Other pulmonary embolism without acute cor pulmonale: Secondary | ICD-10-CM

## 2012-04-03 DIAGNOSIS — Z7901 Long term (current) use of anticoagulants: Secondary | ICD-10-CM

## 2012-04-03 DIAGNOSIS — D6859 Other primary thrombophilia: Secondary | ICD-10-CM | POA: Diagnosis not present

## 2012-04-29 ENCOUNTER — Encounter: Payer: Self-pay | Admitting: Pulmonary Disease

## 2012-04-29 ENCOUNTER — Ambulatory Visit (INDEPENDENT_AMBULATORY_CARE_PROVIDER_SITE_OTHER): Payer: Medicare Other | Admitting: Pulmonary Disease

## 2012-04-29 VITALS — BP 102/78 | HR 116 | Temp 98.4°F | Ht 66.0 in | Wt 193.6 lb

## 2012-04-29 DIAGNOSIS — J449 Chronic obstructive pulmonary disease, unspecified: Secondary | ICD-10-CM | POA: Diagnosis not present

## 2012-04-29 DIAGNOSIS — J4489 Other specified chronic obstructive pulmonary disease: Secondary | ICD-10-CM

## 2012-04-29 MED ORDER — METHYLPREDNISOLONE 8 MG PO TABS: ORAL_TABLET | ORAL | Status: DC

## 2012-04-29 NOTE — Progress Notes (Signed)
  Subjective:    Patient ID: [name], female    DOB: 1950/04/10, 62 y.o.   MRN: [id number]  HPI The patient comes in today for followup as well as an acute visit.  She has known severe COPD, but has finally quit smoking over the last 2 months.  She is staying on her bronchodilator regimen, but most recently has seen any worsening of her shortness of breath.  She has cough with white foamy mucus, but no purulence or significant chest congestion.  She is describing awakenings at night with a feeling of choking and also coughing that is dry.  The question has been raised whether she may have sleep apnea, but she has never had a sleep study.  Her history today is very suggestive of sleep disordered breathing.  However, also explained to her this could be from reflux disease or from heart issues.   Review of Systems  Constitutional: Negative for fever and unexpected weight change.  HENT: Positive for congestion, rhinorrhea, sneezing and postnasal drip. Negative for ear pain, nosebleeds, sore throat, trouble swallowing, dental problem and sinus pressure.   Eyes: Negative for redness and itching.  Respiratory: Positive for cough, shortness of breath and wheezing. Negative for chest tightness.   Cardiovascular: Positive for palpitations. Negative for leg swelling.  Gastrointestinal: Negative for nausea and vomiting.  Genitourinary: Negative for dysuria.  Musculoskeletal: Negative for joint swelling.  Skin: Negative for rash.  Neurological: Positive for tremors. Negative for headaches.  Hematological: Does not bruise/bleed easily.  Psychiatric/Behavioral: Negative for dysphoric mood. The patient is not nervous/anxious.        Objective:   Physical Exam Well-developed female in no acute distress Nose without purulent or discharge noted Oropharynx clear Neck without lymphadenopathy or thyromegaly Chest with moderately decreased breath sounds, no active wheezing, no crackles heard Cardiac  exam with regular rate and rhythm Lower extremities with 1+ edema, no cyanosis Alert and oriented, moves all 4 extremities.       Assessment & Plan:

## 2012-04-29 NOTE — Assessment & Plan Note (Signed)
The patient has a history of severe COPD, but has now quit smoking.  She feels that her breathing has worsened most recently during the day, and is also having awakenings at night with shortness of breath coughing.  She is not bringing up purulent mucus, nor does she feel she has a chest cold at this time.  I have told her that obstructive sleep apnea can cause similar symptoms, as well as congestive heart failure and reflux disease.  She has never been formally diagnosed with sleep apnea, but has classic symptoms.  I would like to treat her with a short course of Medrol to see if things improve.  If they do not, we will have to go down a different path for further evaluation.

## 2012-04-29 NOTE — Patient Instructions (Addendum)
Will treat with a course of medrol 8mg :  Take 4 each day for 2 days, then 3 each day for 2 days, then 2 each day for 2 days, then one each day for 2 days, then stop Please call me at the end of the new medication to let me know if your symptoms are improved. Stay on your current breathing medications. Stay off cigs.  You are doing great! followup with me in 6mos if doing better.

## 2012-05-02 ENCOUNTER — Ambulatory Visit (INDEPENDENT_AMBULATORY_CARE_PROVIDER_SITE_OTHER): Payer: Medicare Other | Admitting: *Deleted

## 2012-05-02 DIAGNOSIS — D6859 Other primary thrombophilia: Secondary | ICD-10-CM

## 2012-05-02 DIAGNOSIS — Z7901 Long term (current) use of anticoagulants: Secondary | ICD-10-CM

## 2012-05-02 DIAGNOSIS — I2699 Other pulmonary embolism without acute cor pulmonale: Secondary | ICD-10-CM

## 2012-05-02 DIAGNOSIS — D6851 Activated protein C resistance: Secondary | ICD-10-CM

## 2012-05-15 ENCOUNTER — Other Ambulatory Visit: Payer: Self-pay | Admitting: Pulmonary Disease

## 2012-05-16 ENCOUNTER — Ambulatory Visit (INDEPENDENT_AMBULATORY_CARE_PROVIDER_SITE_OTHER): Payer: Medicare Other | Admitting: *Deleted

## 2012-05-16 ENCOUNTER — Telehealth: Payer: Self-pay | Admitting: Pulmonary Disease

## 2012-05-16 DIAGNOSIS — Z7901 Long term (current) use of anticoagulants: Secondary | ICD-10-CM | POA: Diagnosis not present

## 2012-05-16 DIAGNOSIS — I2699 Other pulmonary embolism without acute cor pulmonale: Secondary | ICD-10-CM | POA: Diagnosis not present

## 2012-05-16 DIAGNOSIS — D6851 Activated protein C resistance: Secondary | ICD-10-CM

## 2012-05-16 DIAGNOSIS — D6859 Other primary thrombophilia: Secondary | ICD-10-CM

## 2012-05-16 NOTE — Telephone Encounter (Signed)
ATC NA 

## 2012-05-17 MED ORDER — FLUTICASONE-SALMETEROL 250-50 MCG/DOSE IN AEPB: 1.0000 | INHALATION_SPRAY | Freq: Two times a day (BID) | RESPIRATORY_TRACT | Status: DC

## 2012-05-17 MED ORDER — ALBUTEROL SULFATE HFA 108 (90 BASE) MCG/ACT IN AERS: 2.0000 | INHALATION_SPRAY | RESPIRATORY_TRACT | Status: DC | PRN

## 2012-05-17 NOTE — Telephone Encounter (Signed)
I spoke with the pt and she needs refills on advair and albuterol inhalers. Refills sent. Carron Curie, CMA

## 2012-05-21 ENCOUNTER — Telehealth: Payer: Self-pay | Admitting: *Deleted

## 2012-05-21 NOTE — Telephone Encounter (Signed)
The only equivalent on that list to advair is breo.  One inhalation each am.

## 2012-05-21 NOTE — Telephone Encounter (Signed)
Patients Prior Auth for Advair Diskus was denied through [employer]. Needs to try and fail one the preferred before being considered.  Dr Shelle Iron please advise which of the following you would like patient to try in place of Advair: Pulmicort Flexhaler, QVAR, Symbicort, Breo Ellipta or Spiriva.

## 2012-05-22 MED ORDER — FLUTICASONE FUROATE-VILANTEROL 100-25 MCG/INH IN AEPB: 1.0000 | INHALATION_SPRAY | Freq: Every day | RESPIRATORY_TRACT | Status: DC

## 2012-05-22 NOTE — Telephone Encounter (Signed)
Per protocol this is to be handled by nurse.

## 2012-05-22 NOTE — Telephone Encounter (Signed)
Called pt to make aware of denial of PA and new meds that have been called in.  LMOM x 1  Breo 1 puff once daily faxed to pt pharmacy.

## 2012-06-09 ENCOUNTER — Other Ambulatory Visit: Payer: Self-pay | Admitting: Cardiology

## 2012-06-17 ENCOUNTER — Ambulatory Visit (INDEPENDENT_AMBULATORY_CARE_PROVIDER_SITE_OTHER): Payer: Medicare Other | Admitting: *Deleted

## 2012-06-17 DIAGNOSIS — D6859 Other primary thrombophilia: Secondary | ICD-10-CM

## 2012-06-17 DIAGNOSIS — I2699 Other pulmonary embolism without acute cor pulmonale: Secondary | ICD-10-CM | POA: Diagnosis not present

## 2012-06-17 DIAGNOSIS — Z7901 Long term (current) use of anticoagulants: Secondary | ICD-10-CM

## 2012-06-17 DIAGNOSIS — D6851 Activated protein C resistance: Secondary | ICD-10-CM

## 2012-07-10 ENCOUNTER — Other Ambulatory Visit: Payer: Self-pay | Admitting: *Deleted

## 2012-07-10 MED ORDER — WARFARIN SODIUM 5 MG PO TABS: ORAL_TABLET | ORAL | Status: DC

## 2012-07-10 NOTE — Telephone Encounter (Signed)
Medication sent via escribe for warfarin 

## 2012-07-15 ENCOUNTER — Ambulatory Visit (INDEPENDENT_AMBULATORY_CARE_PROVIDER_SITE_OTHER): Payer: Medicare Other | Admitting: *Deleted

## 2012-07-15 DIAGNOSIS — D6851 Activated protein C resistance: Secondary | ICD-10-CM

## 2012-07-15 DIAGNOSIS — I2699 Other pulmonary embolism without acute cor pulmonale: Secondary | ICD-10-CM

## 2012-07-15 DIAGNOSIS — D6859 Other primary thrombophilia: Secondary | ICD-10-CM | POA: Diagnosis not present

## 2012-07-15 DIAGNOSIS — Z7901 Long term (current) use of anticoagulants: Secondary | ICD-10-CM | POA: Diagnosis not present

## 2012-07-15 MED ORDER — WARFARIN SODIUM 5 MG PO TABS: ORAL_TABLET | ORAL | Status: DC

## 2012-08-02 ENCOUNTER — Other Ambulatory Visit: Payer: Self-pay | Admitting: Pulmonary Disease

## 2012-08-14 ENCOUNTER — Ambulatory Visit: Payer: Medicare Other | Admitting: Cardiovascular Disease

## 2012-08-29 ENCOUNTER — Ambulatory Visit (INDEPENDENT_AMBULATORY_CARE_PROVIDER_SITE_OTHER): Payer: Medicare Other | Admitting: Adult Health

## 2012-08-29 ENCOUNTER — Ambulatory Visit (INDEPENDENT_AMBULATORY_CARE_PROVIDER_SITE_OTHER): Payer: Medicare Other | Admitting: *Deleted

## 2012-08-29 ENCOUNTER — Encounter: Payer: Self-pay | Admitting: Adult Health

## 2012-08-29 VITALS — BP 121/73 | HR 85 | Ht 66.0 in | Wt 193.0 lb

## 2012-08-29 DIAGNOSIS — J449 Chronic obstructive pulmonary disease, unspecified: Secondary | ICD-10-CM | POA: Diagnosis not present

## 2012-08-29 DIAGNOSIS — I2699 Other pulmonary embolism without acute cor pulmonale: Secondary | ICD-10-CM

## 2012-08-29 DIAGNOSIS — I7090 Unspecified atherosclerosis: Secondary | ICD-10-CM | POA: Diagnosis not present

## 2012-08-29 DIAGNOSIS — E785 Hyperlipidemia, unspecified: Secondary | ICD-10-CM

## 2012-08-29 DIAGNOSIS — F17200 Nicotine dependence, unspecified, uncomplicated: Secondary | ICD-10-CM | POA: Diagnosis not present

## 2012-08-29 DIAGNOSIS — I2510 Atherosclerotic heart disease of native coronary artery without angina pectoris: Secondary | ICD-10-CM

## 2012-08-29 MED ORDER — ROSUVASTATIN CALCIUM 40 MG PO TABS: 40.0000 mg | ORAL_TABLET | Freq: Every day | ORAL | Status: DC

## 2012-08-29 MED ORDER — TICAGRELOR 90 MG PO TABS: 90.0000 mg | ORAL_TABLET | Freq: Two times a day (BID) | ORAL | Status: DC

## 2012-08-29 NOTE — Assessment & Plan Note (Signed)
Continues on statin therapy. Risk management will continue.

## 2012-08-29 NOTE — Assessment & Plan Note (Signed)
She occasionally smokes despite known risk factor for progressive CAD. She verbalizes understanding of need to quit smoking.

## 2012-08-29 NOTE — Addendum Note (Signed)
Addended by: Thompson Grayer on: 08/29/2012 03:08 PM   Modules accepted: Orders

## 2012-08-29 NOTE — Progress Notes (Deleted)
Name: [name]    DOB: July 23, 1950  Age: 62 y.o.  MR#: [id number]       PCP:  Thomos Lemons, DO      Insurance: Payor: MEDICARE / Plan: MEDICARE PART A AND B / Product Type: *No Product type* /   CC:   No chief complaint on file.   VS Filed Vitals:   08/29/12 1350  BP: 121/73  Pulse: 85  Height: 5\' 6"  (1.676 m)  Weight: 193 lb (87.544 kg)    Weights Current Weight  08/29/12 193 lb (87.544 kg)  04/29/12 193 lb 9.6 oz (87.816 kg)  03/21/12 192 lb (87.091 kg)    Blood Pressure  BP Readings from Last 3 Encounters:  08/29/12 121/73  04/29/12 102/78  03/21/12 114/68     Admit date:  (Not on file) Last encounter with RMR:  Visit date not found   Allergy Minocycline hcl; Prednisone; Varenicline tartrate; and Zocor  Current Outpatient Prescriptions  Medication Sig Dispense Refill  . fish oil-omega-3 fatty acids 1000 MG capsule Take 2 g by mouth daily.        . Fluticasone-Salmeterol (ADVAIR DISKUS) 250-50 MCG/DOSE AEPB Inhale 1 puff into the lungs every 12 (twelve) hours.  60 each  5  . levothyroxine (SYNTHROID, LEVOTHROID) 125 MCG tablet Take 1 tablet (125 mcg total) by mouth daily.  90 tablet  1  . nitroGLYCERIN (NITROSTAT) 0.4 MG SL tablet Place 1 tablet (0.4 mg total) under the tongue every 5 (five) minutes as needed for chest pain.  25 tablet  2  . rosuvastatin (CRESTOR) 40 MG tablet Take 40 mg by mouth daily.      . Ticagrelor (BRILINTA) 90 MG TABS tablet Take 1 tablet (90 mg total) by mouth 2 (two) times daily.  180 tablet  3  . tiotropium (SPIRIVA) 18 MCG inhalation capsule Place 1 capsule (18 mcg total) into inhaler and inhale daily.  30 capsule  5  . VENTOLIN HFA 108 (90 BASE) MCG/ACT inhaler INHALE TWO PUFFS BY MOUTH EVERY 4 HOURS AS NEEDED FOR WHEEZING  1 Inhaler  0  . warfarin (COUMADIN) 5 MG tablet TAKE ONE TABLET (5MG ) BY MOUTH DAILY EXCEPT TAKE ONE AND ONE-HALF TABLETS (7.5MG ) ON SUNDAYS,WEDNESDAYS AND FRIDAYS OR AS DIRECTED  45 tablet  6   No current  facility-administered medications for this visit.    Discontinued Meds:    Medications Discontinued During This Encounter  Medication Reason  . Fluticasone Furoate-Vilanterol (BREO ELLIPTA) 100-25 MCG/INH AEPB Error  . methylPREDNISolone (MEDROL) 8 MG tablet Error    Patient Active Problem List   Diagnosis Date Noted  . Abnormal weight loss 03/21/2012  . Cerebrovascular disease 11/15/2011  . Arteriosclerotic cardiovascular disease (ASCVD)   . Factor V Leiden, prothrombin gene mutation   . Hyperlipidemia   . Chronic anticoagulation   . HYPOTHYROIDISM 11/23/2006  . TOBACCO ABUSE 11/23/2006  . 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 Chronic obstructive pulmonary disease 11/23/2006    LABS    Component Value Date/Time   NA 138 03/07/2012 0916   NA 142 05/15/2011 1526   NA 142 03/31/2011 1341   K 4.1 03/07/2012 0916   K 4.4 05/15/2011 1526   K 4.0 03/31/2011 1341   CL 107 03/07/2012 0916   CL 103 05/15/2011 1526   CL 106 03/31/2011 1341   CO2 25 03/07/2012 0916   CO2 29 05/15/2011 1526   CO2 28 03/31/2011 1341   GLUCOSE 98 03/07/2012 0916   GLUCOSE 83 05/15/2011 1526  GLUCOSE 98 03/31/2011 1341   BUN 24* 03/07/2012 0916   BUN 13 05/15/2011 1526   BUN 15 03/31/2011 1341   CREATININE 0.8 03/07/2012 0916   CREATININE 0.8 05/15/2011 1526   CREATININE 0.69 04/03/2011 1114   CALCIUM 8.7 03/07/2012 0916   CALCIUM 9.1 05/15/2011 1526   CALCIUM 9.3 03/31/2011 1341   GFRNONAA >90 04/03/2011 1114   GFRNONAA >90 03/31/2011 1341   GFRNONAA >60 06/27/2010 0519   GFRAA >90 04/03/2011 1114   GFRAA >90 03/31/2011 1341   GFRAA >60 06/27/2010 0519   CMP     Component Value Date/Time   NA 138 03/07/2012 0916   K 4.1 03/07/2012 0916   CL 107 03/07/2012 0916   CO2 25 03/07/2012 0916   GLUCOSE 98 03/07/2012 0916   BUN 24* 03/07/2012 0916   CREATININE 0.8 03/07/2012 0916   CALCIUM 8.7 03/07/2012 0916   PROT 7.6 05/15/2011 1526   ALBUMIN 3.7 05/15/2011 1526   AST 18 05/15/2011 1526   ALT 18 05/15/2011 1526   ALKPHOS 94 05/15/2011  1526   BILITOT 0.5 05/15/2011 1526   GFRNONAA >90 04/03/2011 1114   GFRAA >90 04/03/2011 1114       Component Value Date/Time   WBC 7.1 05/15/2011 1526   WBC 6.8 04/03/2011 1114   WBC 8.0 04/03/2011 0710   HGB 15.7* 05/15/2011 1526   HGB 13.8 04/03/2011 1114   HGB 14.1 04/03/2011 0710   HCT 47.2* 05/15/2011 1526   HCT 42.7 04/03/2011 1114   HCT 42.8 04/03/2011 0710   MCV 92.5 05/15/2011 1526   MCV 93.6 04/03/2011 1114   MCV 94.1 04/03/2011 0710    Lipid Panel     Component Value Date/Time   CHOL 143 05/15/2011 1526   TRIG 81.0 05/15/2011 1526   HDL 35.40* 05/15/2011 1526   CHOLHDL 4 05/15/2011 1526   VLDL 16.2 05/15/2011 1526   LDLCALC 91 05/15/2011 1526    ABG    Component Value Date/Time   PHART 7.397 06/23/2010 0407   PCO2ART 37.8 06/23/2010 0407   PO2ART 93.9 06/23/2010 0407   HCO3 22.7 06/23/2010 0407   TCO2 23.9 06/23/2010 0407   ACIDBASEDEF 1.4 06/23/2010 0407   O2SAT 97.8 06/23/2010 0407     Lab Results  Component Value Date   TSH 1.86 03/07/2012   BNP (last 3 results) No results found for this basename: PROBNP,  in the last 8760 hours Cardiac Panel (last 3 results) No results found for this basename: CKTOTAL, CKMB, TROPONINI, RELINDX,  in the last 72 hours  Iron/TIBC/Ferritin No results found for this basename: iron, tibc, ferritin     EKG Orders placed during the hospital encounter of 03/31/11  . EKG 12-LEAD  . EKG 12-LEAD  . EKG     Prior Assessment and Plan Problem List as of 08/29/2012     Cardiovascular and Mediastinum   PULMONARY EMBOLISM   Last Assessment & Plan   09/04/2011 Office Visit Written 09/04/2011 10:48 PM by Meda Coffee, DO     Warfarin management as per cardiology    Arteriosclerotic cardiovascular disease (ASCVD)   Last Assessment & Plan   11/09/2011 Office Visit Edited 11/09/2011  3:06 PM by Kathlen Brunswick, MD     Patient has done well with respect to coronary disease since intervention more than one year ago.  As the result of a history of late  stent thrombosis while being treated with warfarin and unprovoked pulmonary embolism, treatment with a platelet inhibitor plus  an oral anticoagulant will be continued.  Metoprolol will be discontinued to determine if doing so results in improvement in chronic obstructive pulmonary disease Albuterol, especially if delivered via nebulizer, might be adequately effective and significantly less expensive than Advair.    Cerebrovascular disease     Respiratory   Chronic obstructive pulmonary disease   Last Assessment & Plan   04/29/2012 Office Visit Written 04/29/2012  2:20 PM by Barbaraann Share, MD     The patient has a history of severe COPD, but has now quit smoking.  She feels that her breathing has worsened most recently during the day, and is also having awakenings at night with shortness of breath coughing.  She is not bringing up purulent mucus, nor does she feel she has a chest cold at this time.  I have told her that obstructive sleep apnea can cause similar symptoms, as well as congestive heart failure and reflux disease.  She has never been formally diagnosed with sleep apnea, but has classic symptoms.  I would like to treat her with a short course of Medrol to see if things improve.  If they do not, we will have to go down a different path for further evaluation.      Endocrine   HYPOTHYROIDISM   Last Assessment & Plan   03/21/2012 Office Visit Written 03/21/2012  4:52 PM by Meda Coffee, DO      Patient is euthyroid. Continue same dose of thyroid replacement.  Lab Results  Component Value Date   TSH 1.86 03/07/2012         Hematopoietic and Hemostatic   Factor V Leiden, prothrombin gene mutation     Other   TOBACCO ABUSE   Last Assessment & Plan   03/21/2012 Office Visit Written 03/21/2012  4:52 PM by Meda Coffee, DO     Complete tobacco cessation encouraged.    Hyperlipidemia   Last Assessment & Plan   11/09/2011 Office Visit Edited 11/12/2011 11:03 AM by Kathlen Brunswick, MD      Profile good when last assessed.  To decrease the cost of patient's medications, atorvastatin will be substituted for rosuvastatin with repeat lipids obtained in a few months.    Chronic anticoagulation   Abnormal weight loss   Last Assessment & Plan   03/21/2012 Office Visit Written 03/21/2012  4:51 PM by Meda Coffee, DO     62 year old white female with history of tobacco use complains of unexplained weight loss or anorexia. Symptoms concerning for  lung cancer. Obtain CT of chest with contrast.        Imaging: No results found.

## 2012-08-29 NOTE — Assessment & Plan Note (Signed)
No complaints of chest pain or angina. She is medically complaint. No plans for stress testing at this time. She will continue on Brilinta, samples are provided with RX and discount information to assist with cost.

## 2012-08-29 NOTE — Assessment & Plan Note (Signed)
Followed by Dr. Shelle Iron. Defer any medication refills from pulmonary standpoint to him.

## 2012-08-29 NOTE — Patient Instructions (Addendum)
Your physician recommends that you schedule a follow-up appointment in: 6 months  

## 2012-08-29 NOTE — Progress Notes (Signed)
HPI: [name] is  62 y/o patient of Dr. Dietrich Pates we are seeing for ongoing assessment and treatment of CAD, s/p stent to RCA, cutting balloon brachytherapy for instent stenosis, and thrombectomy for in stent thrombus. She is on both Brilinta and coumadin, with history of PE.[name] She also has a history of COPD and is followed by Dr.Clance. She unfortunately continues to smoke. She complains of frequent bruising and chronic dyspnea but no recurrent chest pain. She is medically compliant, but has trouble affording Brilinta.   Allergies  Allergen Reactions  . Minocycline Hcl     REACTION: Dizzy  . Prednisone     REACTION: feels like throat swelling  . Varenicline Tartrate     REACTION: Dizzy(chantix)   . Zocor [Simvastatin - High Dose] Other (See Comments)    myalgia    Current Outpatient Prescriptions  Medication Sig Dispense Refill  . fish oil-omega-3 fatty acids 1000 MG capsule Take 2 g by mouth daily.        . Fluticasone-Salmeterol (ADVAIR DISKUS) 250-50 MCG/DOSE AEPB Inhale 1 puff into the lungs every 12 (twelve) hours.  60 each  5  . levothyroxine (SYNTHROID, LEVOTHROID) 125 MCG tablet Take 1 tablet (125 mcg total) by mouth daily.  90 tablet  1  . nitroGLYCERIN (NITROSTAT) 0.4 MG SL tablet Place 1 tablet (0.4 mg total) under the tongue every 5 (five) minutes as needed for chest pain.  25 tablet  2  . rosuvastatin (CRESTOR) 40 MG tablet Take 40 mg by mouth daily.      . Ticagrelor (BRILINTA) 90 MG TABS tablet Take 1 tablet (90 mg total) by mouth 2 (two) times daily.  180 tablet  3  . tiotropium (SPIRIVA) 18 MCG inhalation capsule Place 1 capsule (18 mcg total) into inhaler and inhale daily.  30 capsule  5  . VENTOLIN HFA 108 (90 BASE) MCG/ACT inhaler INHALE TWO PUFFS BY MOUTH EVERY 4 HOURS AS NEEDED FOR WHEEZING  1 Inhaler  0  . warfarin (COUMADIN) 5 MG tablet TAKE ONE TABLET (5MG ) BY MOUTH DAILY EXCEPT TAKE ONE AND ONE-HALF TABLETS (7.5MG ) ON SUNDAYS,WEDNESDAYS AND FRIDAYS OR AS  DIRECTED  45 tablet  6   No current facility-administered medications for this visit.    Past Medical History  Diagnosis Date  . Arteriosclerotic cardiovascular disease (ASCVD) 2002    Inf STEMI-2002. 2003-cutting balloon + brachytherapy for restenosis; subsequent acute stent thrombosis 06/2010 requiring 2 separate interventions (Zeta stent, then repeat cath with thrombectomy). focal basal inf AK, nl EF; 03/2011: Patent stents, minor nonobst  residual dz, nl EF; neg stress nuclear in 2008 and stress echo in 2009  . Pulmonary embolism 2006    Associated with deep vein thrombosis-2006; + factor V Leiden  . Factor V Leiden, prothrombin gene mutation 2006  . Hyperlipidemia   . COPD (chronic obstructive pulmonary disease)   . Tobacco abuse     50  pack years  . Hypothyroidism   . Noncompliance   . Pelvic fracture 2009  . Chronic anticoagulation     Warfarin plus ticagrelor    Past Surgical History  Procedure Laterality Date  . Coronary angioplasty  2002, 2003, 2012  . Colonoscopy  Approximately 2000    Negative screening study    WJX:BJYNWG of systems complete and found to be negative unless listed above  PHYSICAL EXAM BP 121/73  Pulse 85  Ht 5\' 6"  (1.676 m)  Wt 193 lb (87.544 kg)  BMI 31.17 kg/m2 General: Well developed, well nourished,  in no acute distress Head: Eyes PERRLA, No xanthomas.   Normal cephalic and atramatic  Lungs: Clear bilaterally with expiratory wheezes.  Heart: HRRR S1 S2, with 1/6 systolic murmur  Pulses are 2+ & equal.            No carotid bruit. No JVD.   Abdomen: Bowel sounds are positive, abdomen soft and non-tender without masses or                  Hernia's noted. Msk:  Back normal, normal gait. Normal strength and tone for age. Extremities: No clubbing, cyanosis or edema.  DP +1 Neuro: Alert and oriented X 3. Psych:  Good affect, responds appropriately  EKG: NSR rate of 88 bpm.   ASSESSMENT AND PLAN

## 2012-09-05 ENCOUNTER — Other Ambulatory Visit: Payer: Self-pay | Admitting: Pulmonary Disease

## 2012-09-19 ENCOUNTER — Ambulatory Visit (INDEPENDENT_AMBULATORY_CARE_PROVIDER_SITE_OTHER): Payer: Medicare Other | Admitting: *Deleted

## 2012-09-19 DIAGNOSIS — I2699 Other pulmonary embolism without acute cor pulmonale: Secondary | ICD-10-CM

## 2012-09-19 DIAGNOSIS — D6859 Other primary thrombophilia: Secondary | ICD-10-CM

## 2012-09-19 DIAGNOSIS — Z7901 Long term (current) use of anticoagulants: Secondary | ICD-10-CM

## 2012-09-19 DIAGNOSIS — D6851 Activated protein C resistance: Secondary | ICD-10-CM

## 2012-09-26 DIAGNOSIS — Z23 Encounter for immunization: Secondary | ICD-10-CM | POA: Diagnosis not present

## 2012-10-08 ENCOUNTER — Other Ambulatory Visit: Payer: Self-pay | Admitting: Pulmonary Disease

## 2012-10-17 ENCOUNTER — Ambulatory Visit (INDEPENDENT_AMBULATORY_CARE_PROVIDER_SITE_OTHER): Payer: Medicare Other | Admitting: *Deleted

## 2012-10-17 DIAGNOSIS — I2699 Other pulmonary embolism without acute cor pulmonale: Secondary | ICD-10-CM

## 2012-10-17 DIAGNOSIS — D6859 Other primary thrombophilia: Secondary | ICD-10-CM

## 2012-10-17 DIAGNOSIS — Z7901 Long term (current) use of anticoagulants: Secondary | ICD-10-CM

## 2012-10-17 DIAGNOSIS — D6851 Activated protein C resistance: Secondary | ICD-10-CM

## 2012-10-17 LAB — POCT INR: INR: 2.1

## 2012-10-28 ENCOUNTER — Ambulatory Visit (INDEPENDENT_AMBULATORY_CARE_PROVIDER_SITE_OTHER): Payer: Medicare Other | Admitting: Pulmonary Disease

## 2012-10-28 ENCOUNTER — Encounter: Payer: Self-pay | Admitting: Pulmonary Disease

## 2012-10-28 VITALS — BP 114/62 | HR 92 | Temp 98.0°F | Ht 66.0 in | Wt 188.4 lb

## 2012-10-28 DIAGNOSIS — J449 Chronic obstructive pulmonary disease, unspecified: Secondary | ICD-10-CM | POA: Diagnosis not present

## 2012-10-28 NOTE — Progress Notes (Signed)
  Subjective:    Patient ID: [name], female    DOB: [birth year], 61 y.o.   MRN: [id number]  HPI The patient comes in today for followup of her known COPD with chronic respiratory failure.  Unfortunately, she has continued to smoke, and recently stopped her Advair because she could not afford.  She was on the patient assistance program at one time for this medication.  She notes increasing cough with nonpurulent mucus, and also increasing shortness of breath.  She does not want to take a course of prednisone and at this time.   Review of Systems  Constitutional: Negative for fever and unexpected weight change.  HENT: Negative for congestion, dental problem, ear pain, nosebleeds, postnasal drip, rhinorrhea, sinus pressure, sneezing, sore throat and trouble swallowing.   Eyes: Negative for redness and itching.  Respiratory: Positive for cough, shortness of breath and wheezing. Negative for chest tightness.   Cardiovascular: Negative for palpitations and leg swelling.  Gastrointestinal: Negative for nausea and vomiting.  Genitourinary: Negative for dysuria.  Musculoskeletal: Negative for joint swelling.  Skin: Negative for rash.  Neurological: Negative for headaches.  Hematological: Does not bruise/bleed easily.  Psychiatric/Behavioral: Negative for dysphoric mood. The patient is not nervous/anxious.        Objective:   Physical Exam Overweight female in no acute distress Nose without purulence or discharge noted Neck without lymphadenopathy or thyromegaly Chest with decreased breath sounds, no active wheezing or rhonchi Cardiac exam with regular rate and rhythm Lower extremities with mild edema, no cyanosis Alert and oriented, moves all 4 extremities.       Assessment & Plan:

## 2012-10-28 NOTE — Assessment & Plan Note (Signed)
The patient is having increasing shortness of breath that I suspect is secondary to increased airway inflammation from ongoing smoking and discontinuing her Advair.  I have stressed to her again the importance of total smoking cessation.  She was on the patient assistance program at one time for Advair, and she may have just forgot to call them to get it refilled.  We'll get her back on a LABA/ICS, and also stressed to her the importance of total smoking cessation.  Hopefully she will not require a course of steroids or antibiotics to get her through this.

## 2012-10-28 NOTE — Patient Instructions (Signed)
Continue on your current meds.  Will check to see if we can get you back on advair patient assistance program.  If not, you need to check to see if symbicort 160/4.5 or dulera 100/5 are on your insurance plan Stop smoking 100%.  We may not be able to keep you well without this. Will refill you albuterol with 6 fills.  Try not to use except for emergencies. followup with me in 6mos, but let us know if you continue to do poorly.

## 2012-11-14 ENCOUNTER — Ambulatory Visit (INDEPENDENT_AMBULATORY_CARE_PROVIDER_SITE_OTHER): Payer: Medicare Other | Admitting: *Deleted

## 2012-11-14 DIAGNOSIS — Z7901 Long term (current) use of anticoagulants: Secondary | ICD-10-CM | POA: Diagnosis not present

## 2012-11-14 DIAGNOSIS — I2699 Other pulmonary embolism without acute cor pulmonale: Secondary | ICD-10-CM

## 2012-11-14 DIAGNOSIS — D6851 Activated protein C resistance: Secondary | ICD-10-CM

## 2012-11-14 DIAGNOSIS — D6859 Other primary thrombophilia: Secondary | ICD-10-CM | POA: Diagnosis not present

## 2012-12-04 ENCOUNTER — Other Ambulatory Visit: Payer: Self-pay | Admitting: Pulmonary Disease

## 2012-12-12 ENCOUNTER — Ambulatory Visit (INDEPENDENT_AMBULATORY_CARE_PROVIDER_SITE_OTHER): Payer: Medicare Other | Admitting: *Deleted

## 2012-12-12 DIAGNOSIS — I2699 Other pulmonary embolism without acute cor pulmonale: Secondary | ICD-10-CM

## 2012-12-12 DIAGNOSIS — Z7901 Long term (current) use of anticoagulants: Secondary | ICD-10-CM

## 2012-12-12 DIAGNOSIS — D6859 Other primary thrombophilia: Secondary | ICD-10-CM

## 2012-12-12 DIAGNOSIS — D6851 Activated protein C resistance: Secondary | ICD-10-CM

## 2013-01-23 ENCOUNTER — Ambulatory Visit (INDEPENDENT_AMBULATORY_CARE_PROVIDER_SITE_OTHER): Payer: Medicare Other | Admitting: *Deleted

## 2013-01-23 DIAGNOSIS — D6859 Other primary thrombophilia: Secondary | ICD-10-CM | POA: Diagnosis not present

## 2013-01-23 DIAGNOSIS — I2699 Other pulmonary embolism without acute cor pulmonale: Secondary | ICD-10-CM

## 2013-01-23 DIAGNOSIS — Z7901 Long term (current) use of anticoagulants: Secondary | ICD-10-CM

## 2013-01-23 DIAGNOSIS — D6851 Activated protein C resistance: Secondary | ICD-10-CM

## 2013-01-23 LAB — POCT INR: INR: 1.7

## 2013-02-13 ENCOUNTER — Ambulatory Visit (INDEPENDENT_AMBULATORY_CARE_PROVIDER_SITE_OTHER): Payer: Medicare Other | Admitting: *Deleted

## 2013-02-13 DIAGNOSIS — Z7901 Long term (current) use of anticoagulants: Secondary | ICD-10-CM

## 2013-02-13 DIAGNOSIS — D6859 Other primary thrombophilia: Secondary | ICD-10-CM | POA: Diagnosis not present

## 2013-02-13 DIAGNOSIS — Z5181 Encounter for therapeutic drug level monitoring: Secondary | ICD-10-CM | POA: Diagnosis not present

## 2013-02-13 DIAGNOSIS — I2699 Other pulmonary embolism without acute cor pulmonale: Secondary | ICD-10-CM | POA: Diagnosis not present

## 2013-02-13 DIAGNOSIS — D6851 Activated protein C resistance: Secondary | ICD-10-CM

## 2013-02-13 LAB — POCT INR: INR: 2.1

## 2013-02-27 ENCOUNTER — Ambulatory Visit (INDEPENDENT_AMBULATORY_CARE_PROVIDER_SITE_OTHER): Payer: Medicare Other | Admitting: Cardiovascular Disease

## 2013-02-27 ENCOUNTER — Encounter: Payer: Self-pay | Admitting: Cardiovascular Disease

## 2013-02-27 VITALS — BP 119/55 | HR 89 | Ht 66.0 in | Wt 194.0 lb

## 2013-02-27 DIAGNOSIS — I10 Essential (primary) hypertension: Secondary | ICD-10-CM

## 2013-02-27 DIAGNOSIS — Z7901 Long term (current) use of anticoagulants: Secondary | ICD-10-CM | POA: Diagnosis not present

## 2013-02-27 DIAGNOSIS — R079 Chest pain, unspecified: Secondary | ICD-10-CM

## 2013-02-27 DIAGNOSIS — D6859 Other primary thrombophilia: Secondary | ICD-10-CM | POA: Diagnosis not present

## 2013-02-27 DIAGNOSIS — I2510 Atherosclerotic heart disease of native coronary artery without angina pectoris: Secondary | ICD-10-CM | POA: Diagnosis not present

## 2013-02-27 DIAGNOSIS — I2699 Other pulmonary embolism without acute cor pulmonale: Secondary | ICD-10-CM | POA: Diagnosis not present

## 2013-02-27 DIAGNOSIS — D6851 Activated protein C resistance: Secondary | ICD-10-CM

## 2013-02-27 DIAGNOSIS — R0989 Other specified symptoms and signs involving the circulatory and respiratory systems: Secondary | ICD-10-CM

## 2013-02-27 DIAGNOSIS — E785 Hyperlipidemia, unspecified: Secondary | ICD-10-CM

## 2013-02-27 MED ORDER — RANOLAZINE ER 500 MG PO TB12: 500.0000 mg | ORAL_TABLET | Freq: Two times a day (BID) | ORAL | Status: DC

## 2013-02-27 NOTE — Patient Instructions (Addendum)
Your physician recommends that you schedule a follow-up appointment in: 6 months   Your physician has recommended you make the following change in your medication:    Start Ranexa 500 mg twice a day     Thanks for choosing [facility] !

## 2013-02-27 NOTE — Progress Notes (Signed)
Patient ID: [name], female   DOB: 07-Oct-1950, 63 y.o.   MRN: [id number]      SUBJECTIVE: The patient is a 63 year old woman who I'm evaluating for the first time. She has a history of coronary artery disease with prior PCI of the RCA and has had both in-stent restenosis as well as in-stent thrombosis. She has a history of pulmonary embolism and factor V Leiden deficiency, and is on chronic warfarin therapy. She is also on Brilinta. She has a history of tobacco abuse and COPD. She says she has not smoked for the past 4 months. She said she has had daily chest pain since 2002 and sometimes takes sublingual nitroglycerin and it is usually relieved with one tablet, sometimes 2. He says this is chronic. She has chronic dyspnea on exertion. She occasionally have lightheadedness but denies palpitations and syncope. She also denies leg swelling. Her boyfriend, who is a Administrator, also smokes.   Allergies  Allergen Reactions  . Minocycline Hcl     REACTION: Dizzy  . Prednisone     REACTION: feels like throat swelling  . Varenicline Tartrate     REACTION: Dizzy(chantix)   . Zocor [Simvastatin - High Dose] Other (See Comments)    myalgia    Current Outpatient Prescriptions  Medication Sig Dispense Refill  . fish oil-omega-3 fatty acids 1000 MG capsule Take 2 g by mouth daily.        [name] levothyroxine (SYNTHROID, LEVOTHROID) 125 MCG tablet Take 1 tablet (125 mcg total) by mouth daily.  90 tablet  1  . nitroGLYCERIN (NITROSTAT) 0.4 MG SL tablet Place 1 tablet (0.4 mg total) under the tongue every 5 (five) minutes as needed for chest pain.  25 tablet  2  . PROAIR HFA 108 (90 BASE) MCG/ACT inhaler INHALE TWO PUFFS BY MOUTH EVERY 4 HOURS AS NEEDED FOR WHEEZING  9 each  4  . rosuvastatin (CRESTOR) 40 MG tablet Take 1 tablet (40 mg total) by mouth daily.  90 tablet  1  . Ticagrelor (BRILINTA) 90 MG TABS tablet Take 1 tablet (90 mg total) by mouth 2 (two) times daily.  180 tablet  1  . tiotropium  (SPIRIVA) 18 MCG inhalation capsule Place 1 capsule (18 mcg total) into inhaler and inhale daily.  30 capsule  5  . warfarin (COUMADIN) 5 MG tablet TAKE ONE TABLET (5MG ) BY MOUTH DAILY EXCEPT TAKE ONE AND ONE-HALF TABLETS (7.5MG ) ON SUNDAYS,WEDNESDAYS AND FRIDAYS OR AS DIRECTED  45 tablet  6   No current facility-administered medications for this visit.    Past Medical History  Diagnosis Date  . Arteriosclerotic cardiovascular disease (ASCVD) 2002    Inf STEMI-2002. 2003-cutting balloon + brachytherapy for restenosis; subsequent acute stent thrombosis 06/2010 requiring 2 separate interventions (Zeta stent, then repeat cath with thrombectomy). focal basal inf AK, nl EF; 03/2011: Patent stents, minor nonobst  residual dz, nl EF; neg stress nuclear in 2008 and stress echo in 2009  . Pulmonary embolism 2006    Associated with deep vein thrombosis-2006; + factor V Leiden  . Factor V Leiden, prothrombin gene mutation 2006  . Hyperlipidemia   . COPD (chronic obstructive pulmonary disease)   . Tobacco abuse     50  pack years  . Hypothyroidism   . Noncompliance   . Pelvic fracture 2009  . Chronic anticoagulation     Warfarin plus ticagrelor    Past Surgical History  Procedure Laterality Date  . Coronary angioplasty  2002, 2003, 2012  .  Colonoscopy  Approximately 2000    Negative screening study    History   Social History  . Marital Status: Divorced    Spouse Name: N/A    Number of Children: N/A  . Years of Education: N/A   Occupational History  . Disabled   . Truck driver    Social History Main Topics  . Smoking status: Former Smoker -- 1.00 packs/day for 50 years    Types: Cigarettes  . Smokeless tobacco: Never Used     Comment: has not had a ciggerette in 4 months as of [birth year]-27[birth year]61  . Alcohol Use: No  . Drug Use: No  . Sexual Activity: Not on file   Other Topics Concern  . Not on file   Social History Narrative  . No narrative on file     Filed Vitals:   02/27/13 1317   BP: 119/55  Pulse: 89  Height: 5\' 6"  (1.676 m)  Weight: 194 lb (87.998 kg)    PHYSICAL EXAM General: NAD Neck: No JVD, no thyromegaly or thyroid nodule.  Lungs: Clear to auscultation bilaterally with normal respiratory effort. CV: Nondisplaced PMI.  Heart regular S1/S2, no S3/S4, no murmur.  No peripheral edema. Left carotid bruit.  Normal pedal pulses.  Abdomen: Soft, nontender, no hepatosplenomegaly, no distention.  Neurologic: Alert and oriented x 3.  Psych: Normal affect. Extremities: No clubbing or cyanosis.   ECG: reviewed and available in electronic records.      ASSESSMENT AND PLAN: 1. CAD: I am uncertain if all her chest pain is truly related to angina or whether it is concomitantly due to pulmonary disease from COPD and a history of pulmonary embolism. I will try to alleviate her symptoms with Ranexa 500 mg twice daily. Continue Brilinta and rosuvastatin at present doses. 2. HTN: Controlled on present therapy. 3. Hyperlipidemia: On Crestor 40 mg daily. I will check to see if recent lipids were completed at her primary care physician's office. 4. Factor V Leiden mutation: Given her history of pulmonary embolism and genetic mutation, she requires lifelong therapy with warfarin. 5. Carotid bruit: Less than 50% stenosis bilaterally in November 2013. Continue statin therapy.  Dispo: f/u 6 months.   Kate Sable, M.D., F.A.C.C.

## 2013-03-04 ENCOUNTER — Other Ambulatory Visit: Payer: Self-pay | Admitting: Internal Medicine

## 2013-03-12 ENCOUNTER — Ambulatory Visit (INDEPENDENT_AMBULATORY_CARE_PROVIDER_SITE_OTHER): Payer: Medicare Other | Admitting: *Deleted

## 2013-03-12 DIAGNOSIS — D6851 Activated protein C resistance: Secondary | ICD-10-CM

## 2013-03-12 DIAGNOSIS — I2699 Other pulmonary embolism without acute cor pulmonale: Secondary | ICD-10-CM | POA: Diagnosis not present

## 2013-03-12 DIAGNOSIS — Z7901 Long term (current) use of anticoagulants: Secondary | ICD-10-CM | POA: Diagnosis not present

## 2013-03-12 DIAGNOSIS — Z5181 Encounter for therapeutic drug level monitoring: Secondary | ICD-10-CM | POA: Diagnosis not present

## 2013-03-12 DIAGNOSIS — D6859 Other primary thrombophilia: Secondary | ICD-10-CM

## 2013-03-12 LAB — POCT INR: INR: 2

## 2013-04-09 ENCOUNTER — Ambulatory Visit (INDEPENDENT_AMBULATORY_CARE_PROVIDER_SITE_OTHER): Payer: Medicare Other | Admitting: *Deleted

## 2013-04-09 DIAGNOSIS — Z5181 Encounter for therapeutic drug level monitoring: Secondary | ICD-10-CM | POA: Diagnosis not present

## 2013-04-09 DIAGNOSIS — Z7901 Long term (current) use of anticoagulants: Secondary | ICD-10-CM

## 2013-04-09 DIAGNOSIS — I2699 Other pulmonary embolism without acute cor pulmonale: Secondary | ICD-10-CM | POA: Diagnosis not present

## 2013-04-09 DIAGNOSIS — D6851 Activated protein C resistance: Secondary | ICD-10-CM

## 2013-04-09 DIAGNOSIS — D6859 Other primary thrombophilia: Secondary | ICD-10-CM | POA: Diagnosis not present

## 2013-04-09 LAB — POCT INR: INR: 2.2

## 2013-04-28 ENCOUNTER — Ambulatory Visit (INDEPENDENT_AMBULATORY_CARE_PROVIDER_SITE_OTHER): Payer: Medicare Other | Admitting: Pulmonary Disease

## 2013-04-28 ENCOUNTER — Encounter: Payer: Self-pay | Admitting: Pulmonary Disease

## 2013-04-28 VITALS — BP 118/64 | HR 80 | Temp 97.4°F | Ht 66.0 in | Wt 197.4 lb

## 2013-04-28 DIAGNOSIS — I2510 Atherosclerotic heart disease of native coronary artery without angina pectoris: Secondary | ICD-10-CM

## 2013-04-28 DIAGNOSIS — I2699 Other pulmonary embolism without acute cor pulmonale: Secondary | ICD-10-CM

## 2013-04-28 DIAGNOSIS — J449 Chronic obstructive pulmonary disease, unspecified: Secondary | ICD-10-CM | POA: Diagnosis not present

## 2013-04-28 MED ORDER — ALBUTEROL SULFATE HFA 108 (90 BASE) MCG/ACT IN AERS: INHALATION_SPRAY | RESPIRATORY_TRACT | Status: DC

## 2013-04-28 NOTE — Progress Notes (Signed)
   Subjective:    Patient ID: [name], female    DOB: [birth year], 63 y.o.   MRN: [id number]  HPI The patient comes in today for followup of her known COPD, and history of pulmonary emboli. She quit smoking about 4 months ago, but admits that she occasionally will cheat and smoked half of a cigarette. She has stayed on Spiriva, but never sent in her paperwork for the Advair patient assistance program. I have encouraged her to do so. She has noticed increasing shortness of breath since the last visit, and is wondering whether she requires oxygen or not. She also is severely deconditioned, and I have talked with her about pulmonary rehabilitation program.   Review of Systems  Constitutional: Negative for fever and unexpected weight change.  HENT: Negative for congestion, dental problem, ear pain, nosebleeds, postnasal drip, rhinorrhea, sinus pressure, sneezing, sore throat and trouble swallowing.   Eyes: Negative for redness and itching.  Respiratory: Positive for chest tightness and shortness of breath. Negative for cough and wheezing.        Back pain--left upper back  Cardiovascular: Negative for palpitations and leg swelling.  Gastrointestinal: Negative for nausea and vomiting.  Genitourinary: Negative for dysuria.  Musculoskeletal: Negative for joint swelling.  Skin: Negative for rash.  Neurological: Negative for headaches.  Hematological: Does not bruise/bleed easily.  Psychiatric/Behavioral: Negative for dysphoric mood. The patient is not nervous/anxious.        Objective:   Physical Exam Obese female in no acute distress Nose without purulence or discharge noted Neck without lymphadenopathy or thyromegaly Chest with decreased breath sounds, no wheezes Cardiac exam with a mildly tachycardic but regular rhythm Lower extremities with 1+ edema and venous insufficiency, no cyanosis Alert and oriented, moves all 4 extremities.       Assessment & Plan:

## 2013-04-28 NOTE — Assessment & Plan Note (Signed)
The patient has known severe airflow obstruction, and we will he needs to work on maximizing her bronchodilator regimen and total smoking cessation. I will give her samples of Advair today, and asked her to send in her paperwork for the patient assistance program. I also stressed to her the importance of total smoking cessation. She is very deconditioned and weakened by her own admission, and she is willing to consider pulmonary rehabilitation in [place]. Will also order an echocardiogram given her history of pulmonary embolism and underlying heart disease. Perhaps this is contributing to her worsening shortness of breath.

## 2013-04-28 NOTE — Addendum Note (Signed)
Addended by: Virl Cagey on: 04/28/2013 05:28 PM   Modules accepted: Orders

## 2013-04-28 NOTE — Patient Instructions (Signed)
Get back on advair one inhalation am AND pm everyday.  Send in your paperwork for the patient assistance program Stay on spiriva Will check your oxygen level at night while sleeping, and also today with walking. Will refer to pulmonary rehab program at [facility]. Will check echo of your heart and call you with results. followup with me again in 41mos

## 2013-04-29 ENCOUNTER — Ambulatory Visit (HOSPITAL_COMMUNITY)
Admission: RE | Admit: 2013-04-29 | Discharge: 2013-04-29 | Disposition: A | Discharge status: Home / Self Care | Payer: Medicare Other | Source: Ambulatory Visit | Attending: Pulmonary Disease | Admitting: Pulmonary Disease

## 2013-04-29 ENCOUNTER — Other Ambulatory Visit: Payer: Self-pay | Admitting: Pulmonary Disease

## 2013-04-29 DIAGNOSIS — J4489 Other specified chronic obstructive pulmonary disease: Secondary | ICD-10-CM | POA: Insufficient documentation

## 2013-04-29 DIAGNOSIS — R0989 Other specified symptoms and signs involving the circulatory and respiratory systems: Principal | ICD-10-CM | POA: Insufficient documentation

## 2013-04-29 DIAGNOSIS — I2510 Atherosclerotic heart disease of native coronary artery without angina pectoris: Secondary | ICD-10-CM | POA: Diagnosis not present

## 2013-04-29 DIAGNOSIS — I2699 Other pulmonary embolism without acute cor pulmonale: Secondary | ICD-10-CM

## 2013-04-29 DIAGNOSIS — R0609 Other forms of dyspnea: Secondary | ICD-10-CM | POA: Insufficient documentation

## 2013-04-29 DIAGNOSIS — I10 Essential (primary) hypertension: Secondary | ICD-10-CM | POA: Diagnosis not present

## 2013-04-29 DIAGNOSIS — J449 Chronic obstructive pulmonary disease, unspecified: Secondary | ICD-10-CM | POA: Diagnosis not present

## 2013-04-29 DIAGNOSIS — E785 Hyperlipidemia, unspecified: Secondary | ICD-10-CM | POA: Insufficient documentation

## 2013-04-29 DIAGNOSIS — I319 Disease of pericardium, unspecified: Secondary | ICD-10-CM | POA: Diagnosis not present

## 2013-04-29 NOTE — Progress Notes (Signed)
*  PRELIMINARY RESULTS* Echocardiogram 2D Echocardiogram has been performed.  [name] 04/29/2013, 2:47 PM

## 2013-05-01 ENCOUNTER — Ambulatory Visit (INDEPENDENT_AMBULATORY_CARE_PROVIDER_SITE_OTHER): Payer: Medicare Other | Admitting: Adult Health

## 2013-05-01 ENCOUNTER — Encounter: Payer: Self-pay | Admitting: Adult Health

## 2013-05-01 ENCOUNTER — Telehealth: Payer: Self-pay | Admitting: *Deleted

## 2013-05-01 VITALS — BP 110/63 | HR 90 | Ht 66.0 in | Wt 194.0 lb

## 2013-05-01 DIAGNOSIS — F17200 Nicotine dependence, unspecified, uncomplicated: Secondary | ICD-10-CM | POA: Diagnosis not present

## 2013-05-01 DIAGNOSIS — J449 Chronic obstructive pulmonary disease, unspecified: Secondary | ICD-10-CM

## 2013-05-01 DIAGNOSIS — I7090 Unspecified atherosclerosis: Secondary | ICD-10-CM | POA: Diagnosis not present

## 2013-05-01 DIAGNOSIS — I2510 Atherosclerotic heart disease of native coronary artery without angina pectoris: Secondary | ICD-10-CM | POA: Diagnosis not present

## 2013-05-01 DIAGNOSIS — D6851 Activated protein C resistance: Secondary | ICD-10-CM

## 2013-05-01 DIAGNOSIS — D6859 Other primary thrombophilia: Secondary | ICD-10-CM

## 2013-05-01 MED ORDER — WARFARIN SODIUM 5 MG PO TABS: ORAL_TABLET | ORAL | Status: DC

## 2013-05-01 NOTE — Assessment & Plan Note (Signed)
I will will not plan any further cardiac testing. I have given her a lot of reassurance concerning or echocardiogram. I believe she may have misunderstood her instructions concerning the echocardiogram results that were called from the [facility] office.   She is reassured that there is no evidence of a blood clot in her heart per echocardiogram. She remains on Coumadin in Brilinta. I will continue her on Ranexa. We will see her again in 6 months unless she becomes symptomatic. She verbalizes understanding and appears to be relieved of her anxiety concerning her echocardiogram report.

## 2013-05-01 NOTE — Assessment & Plan Note (Signed)
She continues to have symptoms of this. I have told her that this will probably not improve. Smoking will also cause this to deteriorate. She verbalizes understanding. I think some of what she is experiencing is related to this concerned her chest pressure. Also she uses a cane when she walks favoring her right side and may be having some musculoskeletal pain related to dependency on the cane. I have asked her to talk with her primary care physician about this.

## 2013-05-01 NOTE — Patient Instructions (Signed)
Your physician recommends that you schedule a follow-up appointment in: 6 months with Dr Koneswaran You will receive a reminder letter two months in advance reminding you to call and schedule your appointment. If you don't receive this letter, please contact our office.  Your physician recommends that you continue on your current medications as directed. Please refer to the Current Medication list given to you today.   

## 2013-05-01 NOTE — Progress Notes (Deleted)
Name: [name]    DOB: [birth year]  Age: 63 y.o.  MR#: [id number]       PCP:  Drema Pry, DO      Insurance: Payor: MEDICARE / Plan: MEDICARE PART A AND B / Product Type: *No Product type* /   CC:   No chief complaint on file.   VS Filed Vitals:   05/01/13 1417  BP: 110/63  Pulse: 90  Height: 5\' 6"  (1.676 m)  Weight: 194 lb (87.998 kg)    Weights Current Weight  05/01/13 194 lb (87.998 kg)  04/28/13 197 lb 6.4 oz (89.54 kg)  02/27/13 194 lb (87.998 kg)    Blood Pressure  BP Readings from Last 3 Encounters:  05/01/13 110/63  04/28/13 118/64  02/27/13 119/55     Admit date:  (Not on file) Last encounter with RMR:  Visit date not found   Allergy Minocycline hcl; Prednisone; Varenicline tartrate; and Zocor  Current Outpatient Prescriptions  Medication Sig Dispense Refill  . albuterol (PROAIR HFA) 108 (90 BASE) MCG/ACT inhaler INHALE TWO PUFFS BY MOUTH EVERY 4 HOURS AS NEEDED FOR WHEEZING  1 Inhaler  6  . fish oil-omega-3 fatty acids 1000 MG capsule Take 2 g by mouth daily.        [name] levothyroxine (SYNTHROID, LEVOTHROID) 125 MCG tablet TAKE ONE TABLET BY MOUTH ONCE DAILY  90 tablet  0  . nitroGLYCERIN (NITROSTAT) 0.4 MG SL tablet Place 1 tablet (0.4 mg total) under the tongue every 5 (five) minutes as needed for chest pain.  25 tablet  2  . ranolazine (RANEXA) 500 MG 12 hr tablet Take 1 tablet (500 mg total) by mouth 2 (two) times daily.  60 tablet  6  . rosuvastatin (CRESTOR) 40 MG tablet Take 1 tablet (40 mg total) by mouth daily.  90 tablet  1  . Ticagrelor (BRILINTA) 90 MG TABS tablet Take 1 tablet (90 mg total) by mouth 2 (two) times daily.  180 tablet  1  . tiotropium (SPIRIVA) 18 MCG inhalation capsule Place 1 capsule (18 mcg total) into inhaler and inhale daily.  30 capsule  5  . warfarin (COUMADIN) 5 MG tablet TAKE ONE TABLET (5MG ) BY MOUTH DAILY EXCEPT TAKE ONE AND ONE-HALF TABLETS (7.5MG ) ON SUNDAYS,WEDNESDAYS AND FRIDAYS OR AS DIRECTED  45 tablet  6   No current  facility-administered medications for this visit.    Discontinued Meds:   There are no discontinued medications.  Patient Active Problem List   Diagnosis Date Noted  . Encounter for therapeutic drug monitoring 02/13/2013  . Abnormal weight loss 03/21/2012  . Cerebrovascular disease 11/15/2011  . Arteriosclerotic cardiovascular disease (ASCVD)   . Factor V Leiden, prothrombin gene mutation   . Hyperlipidemia   . Chronic anticoagulation   . HYPOTHYROIDISM 11/23/2006  . TOBACCO ABUSE 11/23/2006  . 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 Chronic obstructive pulmonary disease 11/23/2006    LABS    Component Value Date/Time   NA 138 03/07/2012 0916   NA 142 05/15/2011 1526   NA 142 03/31/2011 1341   K 4.1 03/07/2012 0916   K 4.4 05/15/2011 1526   K 4.0 03/31/2011 1341   CL 107 03/07/2012 0916   CL 103 05/15/2011 1526   CL 106 03/31/2011 1341   CO2 25 03/07/2012 0916   CO2 29 05/15/2011 1526   CO2 28 03/31/2011 1341   GLUCOSE 98 03/07/2012 0916   GLUCOSE 83 05/15/2011 1526   GLUCOSE 98 03/31/2011 1341   BUN 24* 03/07/2012  0916   BUN 13 05/15/2011 1526   BUN 15 03/31/2011 1341   CREATININE 0.8 03/07/2012 0916   CREATININE 0.8 05/15/2011 1526   CREATININE 0.69 04/03/2011 1114   CALCIUM 8.7 03/07/2012 0916   CALCIUM 9.1 05/15/2011 1526   CALCIUM 9.3 03/31/2011 1341   GFRNONAA >90 04/03/2011 1114   GFRNONAA >90 03/31/2011 1341   GFRNONAA >60 06/27/2010 0519   GFRAA >90 04/03/2011 1114   GFRAA >90 03/31/2011 1341   GFRAA >60 06/27/2010 0519   CMP     Component Value Date/Time   NA 138 03/07/2012 0916   K 4.1 03/07/2012 0916   CL 107 03/07/2012 0916   CO2 25 03/07/2012 0916   GLUCOSE 98 03/07/2012 0916   BUN 24* 03/07/2012 0916   CREATININE 0.8 03/07/2012 0916   CALCIUM 8.7 03/07/2012 0916   PROT 7.6 05/15/2011 1526   ALBUMIN 3.7 05/15/2011 1526   AST 18 05/15/2011 1526   ALT 18 05/15/2011 1526   ALKPHOS 94 05/15/2011 1526   BILITOT 0.5 05/15/2011 1526   GFRNONAA >90 04/03/2011 1114   GFRAA >90 04/03/2011 1114        Component Value Date/Time   WBC 7.1 05/15/2011 1526   WBC 6.8 04/03/2011 1114   WBC 8.0 04/03/2011 0710   HGB 15.7* 05/15/2011 1526   HGB 13.8 04/03/2011 1114   HGB 14.1 04/03/2011 0710   HCT 47.2* 05/15/2011 1526   HCT 42.7 04/03/2011 1114   HCT 42.8 04/03/2011 0710   MCV 92.5 05/15/2011 1526   MCV 93.6 04/03/2011 1114   MCV 94.1 04/03/2011 0710    Lipid Panel     Component Value Date/Time   CHOL 143 05/15/2011 1526   TRIG 81.0 05/15/2011 1526   HDL 35.40* 05/15/2011 1526   CHOLHDL 4 05/15/2011 1526   VLDL 16.2 05/15/2011 1526   LDLCALC 91 05/15/2011 1526    ABG    Component Value Date/Time   PHART 7.397 06/23/2010 0407   PCO2ART 37.8 06/23/2010 0407   PO2ART 93.9 06/23/2010 0407   HCO3 22.7 06/23/2010 0407   TCO2 23.9 06/23/2010 0407   ACIDBASEDEF 1.4 06/23/2010 0407   O2SAT 97.8 06/23/2010 0407     Lab Results  Component Value Date   TSH 1.86 03/07/2012   BNP (last 3 results) No results found for this basename: PROBNP,  in the last 8760 hours Cardiac Panel (last 3 results) No results found for this basename: CKTOTAL, CKMB, TROPONINI, RELINDX,  in the last 72 hours  Iron/TIBC/Ferritin No results found for this basename: iron, tibc, ferritin     EKG Orders placed in visit on 08/29/12  . EKG 12-LEAD     Prior Assessment and Plan Problem List as of 05/01/2013     Cardiovascular and Mediastinum   PULMONARY EMBOLISM   Last Assessment & Plan   09/04/2011 Office Visit Written 09/04/2011 10:48 PM by Rosine Abe, DO     Warfarin management as per cardiology    Arteriosclerotic cardiovascular disease (ASCVD)   Last Assessment & Plan   08/29/2012 Office Visit Written 08/29/2012  2:18 PM by Lendon Colonel, NP     No complaints of chest pain or angina. She is medically complaint. No plans for stress testing at this time. She will continue on Brilinta, samples are provided with RX and discount information to assist with cost.    Cerebrovascular disease     Respiratory   Chronic obstructive  pulmonary disease   Last Assessment & Plan  04/28/2013 Office Visit Written 04/28/2013  2:01 PM by Kathee Delton, MD     The patient has known severe airflow obstruction, and we will he needs to work on maximizing her bronchodilator regimen and total smoking cessation. I will give her samples of Advair today, and asked her to send in her paperwork for the patient assistance program. I also stressed to her the importance of total smoking cessation. She is very deconditioned and weakened by her own admission, and she is willing to consider pulmonary rehabilitation in [place]. Will also order an echocardiogram given her history of pulmonary embolism and underlying heart disease. Perhaps this is contributing to her worsening shortness of breath.      Endocrine   HYPOTHYROIDISM   Last Assessment & Plan   03/21/2012 Office Visit Written 03/21/2012  4:52 PM by Rosine Abe, DO      Patient is euthyroid. Continue same dose of thyroid replacement.  Lab Results  Component Value Date   TSH 1.86 03/07/2012         Hematopoietic and Hemostatic   Factor V Leiden, prothrombin gene mutation     Other   TOBACCO ABUSE   Last Assessment & Plan   08/29/2012 Office Visit Written 08/29/2012  2:18 PM by Lendon Colonel, NP     She occasionally smokes despite known risk factor for progressive CAD. She verbalizes understanding of need to quit smoking.    Hyperlipidemia   Last Assessment & Plan   08/29/2012 Office Visit Written 08/29/2012  2:19 PM by Lendon Colonel, NP     Continues on statin therapy. Risk management will continue.    Chronic anticoagulation   Abnormal weight loss   Last Assessment & Plan   03/21/2012 Office Visit Written 03/21/2012  4:51 PM by Rosine Abe, DO     63 year old white female with history of tobacco use complains of unexplained weight loss or anorexia. Symptoms concerning for  lung cancer. Obtain CT of chest with contrast.    Encounter for therapeutic drug monitoring        Imaging: No results found.

## 2013-05-01 NOTE — Telephone Encounter (Signed)
ONO results have been received and placed in [facility] folder for review. Please advise, thanks.   

## 2013-05-01 NOTE — Progress Notes (Deleted)
    HPI:   Allergies  Allergen Reactions  . Minocycline Hcl     REACTION: Dizzy  . Prednisone     REACTION: feels like throat swelling  . Varenicline Tartrate     REACTION: Dizzy(chantix)   . Zocor [Simvastatin - High Dose] Other (See Comments)    myalgia    Current Outpatient Prescriptions  Medication Sig Dispense Refill  . albuterol (PROAIR HFA) 108 (90 BASE) MCG/ACT inhaler INHALE TWO PUFFS BY MOUTH EVERY 4 HOURS AS NEEDED FOR WHEEZING  1 Inhaler  6  . fish oil-omega-3 fatty acids 1000 MG capsule Take 2 g by mouth daily.        [name] levothyroxine (SYNTHROID, LEVOTHROID) 125 MCG tablet TAKE ONE TABLET BY MOUTH ONCE DAILY  90 tablet  0  . nitroGLYCERIN (NITROSTAT) 0.4 MG SL tablet Place 1 tablet (0.4 mg total) under the tongue every 5 (five) minutes as needed for chest pain.  25 tablet  2  . ranolazine (RANEXA) 500 MG 12 hr tablet Take 1 tablet (500 mg total) by mouth 2 (two) times daily.  60 tablet  6  . rosuvastatin (CRESTOR) 40 MG tablet Take 1 tablet (40 mg total) by mouth daily.  90 tablet  1  . Ticagrelor (BRILINTA) 90 MG TABS tablet Take 1 tablet (90 mg total) by mouth 2 (two) times daily.  180 tablet  1  . tiotropium (SPIRIVA) 18 MCG inhalation capsule Place 1 capsule (18 mcg total) into inhaler and inhale daily.  30 capsule  5  . warfarin (COUMADIN) 5 MG tablet TAKE ONE TABLET (5MG ) BY MOUTH DAILY EXCEPT TAKE ONE AND ONE-HALF TABLETS (7.5MG ) ON SUNDAYS,WEDNESDAYS AND FRIDAYS OR AS DIRECTED  45 tablet  6   No current facility-administered medications for this visit.    Past Medical History  Diagnosis Date  . Arteriosclerotic cardiovascular disease (ASCVD) 2002    Inf STEMI-2002. 2003-cutting balloon + brachytherapy for restenosis; subsequent acute stent thrombosis 06/2010 requiring 2 separate interventions (Zeta stent, then repeat cath with thrombectomy). focal basal inf AK, nl EF; 03/2011: Patent stents, minor nonobst  residual dz, nl EF; neg stress nuclear in 2008 and stress  echo in 2009  . Pulmonary embolism 2006    Associated with deep vein thrombosis-2006; + factor V Leiden  . Factor V Leiden, prothrombin gene mutation 2006  . Hyperlipidemia   . COPD (chronic obstructive pulmonary disease)   . Tobacco abuse     50  pack years  . Hypothyroidism   . Noncompliance   . Pelvic fracture 2009  . Chronic anticoagulation     Warfarin plus ticagrelor    Past Surgical History  Procedure Laterality Date  . Coronary angioplasty  2002, 2003, 2012  . Colonoscopy  Approximately 2000    Negative screening study    ROS:  *** PHYSICAL EXAM BP 110/63  Pulse 90  Ht 5\' 6"  (1.676 m)  Wt 194 lb (87.998 kg)  BMI 31.33 kg/m2 ***  EKG:  ***  ASSESSMENT AND PLAN

## 2013-05-01 NOTE — Assessment & Plan Note (Signed)
Have again spoken with her about smoking cessation. I am unhappy there but she restarted. She is going to try and put them down again.

## 2013-05-01 NOTE — Assessment & Plan Note (Signed)
He will continue on Coumadin and antiplatelet therapy with Brilinta.

## 2013-05-01 NOTE — Progress Notes (Signed)
HPI: [name] is a [age] patient of Dr. Pennelope Bracken, whereupon for ongoing assessment and management of CAD, status post PCI to the RCA, with both in-stent restenosis and in-stent thrombosis. She has a history of factor V Leiden  deficiency, is on chronic Coumadin therapy along Brilinta. On last visit she had ongoing shortness of breath and chest pressure. She was placed on Ranexa. Dr. Pennelope Bracken did not feel this is related to her CAD and more of a pulmonary issue.   She followed up with Dr. Gwenette Greet, pulmonologist, who ordered an echocardiogram for evaluation of LV function related to her chest pressure. This revealed mild LVH with an EF of 5055% with inferior wall motion abnormality consistent with ischemic heart disease and grade 1 diastolic dysfunction.  The patient states she had a phone call from their office stating that she probably had another blood clot in her lungs causing her to feel pressure in her chest. This caused her a great deal of distress. The patient quit smoking for several months, but began smoking again last evening with this type of knees. She is unhappy today in the office and she feels things are progressing concerning her heart disease.  She states that the pressure in her chest is really unchanged. The main concern is right lower back pain in the flank area and just below the scapula. The patient uses a cane for ambulation.  Allergies  Allergen Reactions  . Minocycline Hcl     REACTION: Dizzy  . Prednisone     REACTION: feels like throat swelling  . Varenicline Tartrate     REACTION: Dizzy(chantix)   . Zocor [Simvastatin - High Dose] Other (See Comments)    myalgia    Current Outpatient Prescriptions  Medication Sig Dispense Refill  . albuterol (PROAIR HFA) 108 (90 BASE) MCG/ACT inhaler INHALE TWO PUFFS BY MOUTH EVERY 4 HOURS AS NEEDED FOR WHEEZING  1 Inhaler  6  . fish oil-omega-3 fatty acids 1000 MG capsule Take 2 g by mouth daily.        [name] levothyroxine  (SYNTHROID, LEVOTHROID) 125 MCG tablet TAKE ONE TABLET BY MOUTH ONCE DAILY  90 tablet  0  . nitroGLYCERIN (NITROSTAT) 0.4 MG SL tablet Place 1 tablet (0.4 mg total) under the tongue every 5 (five) minutes as needed for chest pain.  25 tablet  2  . ranolazine (RANEXA) 500 MG 12 hr tablet Take 1 tablet (500 mg total) by mouth 2 (two) times daily.  60 tablet  6  . rosuvastatin (CRESTOR) 40 MG tablet Take 1 tablet (40 mg total) by mouth daily.  90 tablet  1  . Ticagrelor (BRILINTA) 90 MG TABS tablet Take 1 tablet (90 mg total) by mouth 2 (two) times daily.  180 tablet  1  . tiotropium (SPIRIVA) 18 MCG inhalation capsule Place 1 capsule (18 mcg total) into inhaler and inhale daily.  30 capsule  5  . warfarin (COUMADIN) 5 MG tablet TAKE ONE TABLET (5MG ) BY MOUTH DAILY EXCEPT TAKE ONE AND ONE-HALF TABLETS (7.5MG ) ON SUNDAYS,WEDNESDAYS AND FRIDAYS OR AS DIRECTED  45 tablet  6   No current facility-administered medications for this visit.    Past Medical History  Diagnosis Date  . Arteriosclerotic cardiovascular disease (ASCVD) 2002    Inf STEMI-2002. 2003-cutting balloon + brachytherapy for restenosis; subsequent acute stent thrombosis 06/2010 requiring 2 separate interventions (Zeta stent, then repeat cath with thrombectomy). focal basal inf AK, nl EF; 03/2011: Patent stents, minor nonobst  residual dz,  nl EF; neg stress nuclear in 2008 and stress echo in 2009  . Pulmonary embolism 2006    Associated with deep vein thrombosis-2006; + factor V Leiden  . Factor V Leiden, prothrombin gene mutation 2006  . Hyperlipidemia   . COPD (chronic obstructive pulmonary disease)   . Tobacco abuse     50 pack years  . Hypothyroidism   . Noncompliance   . Pelvic fracture 2009  . Chronic anticoagulation     Warfarin plus ticagrelor    Past Surgical History  Procedure Laterality Date  . Coronary angioplasty  2002, 2003, 2012  . Colonoscopy  Approximately 2000    Negative screening study    IWL:NLGXQJ of  systems complete and found to be negative unless listed above  PHYSICAL EXAM BP 110/63  Pulse 90  Ht 5\' 6"  (1.676 m)  Wt 194 lb (87.998 kg)  BMI 31.33 kg/m2 General: Well developed, well nourished, in no acute distress, with mild shortness of breath when speaking    Head: Eyes PERRLA, No xanthomas.   Normal cephalic and atramatic  Lungs: Clear bilaterally, with prolonged expiratory phase. Diminished in the bases. Heart: HRRR  tachycardic  S1 S2, without MRG.  Pulses are 2+ & equal.            No carotid bruit. No JVD.  No abdominal bruits. No femoral bruits. Abdomen: Bowel sounds are positive, abdomen soft and non-tender without masses or                  Hernia's noted. Msk:  Back normal, normal gait. uses a cane.  Normal strength and tone for age. Extremities: No clubbing, cyanosis or edema.  DP +1 Neuro: Alert and oriented X 3. Psych:  Good affect, responds appropriately   ASSESSMENT AND PLAN

## 2013-05-02 ENCOUNTER — Telehealth: Payer: Self-pay | Admitting: Pulmonary Disease

## 2013-05-02 NOTE — Telephone Encounter (Signed)
Virl Cagey, CMA at 05/01/2013 11:01 AM     Status: Signed        ONO results have been received and placed in [facility] green folder for review. Please advise, thanks     Please advise [place] thanks

## 2013-05-05 ENCOUNTER — Other Ambulatory Visit: Payer: Self-pay | Admitting: Pulmonary Disease

## 2013-05-05 DIAGNOSIS — J449 Chronic obstructive pulmonary disease, unspecified: Secondary | ICD-10-CM

## 2013-05-05 NOTE — Telephone Encounter (Signed)
LMTCB

## 2013-05-05 NOTE — Telephone Encounter (Signed)
Let pt know that her oxygen level fell to 77%.  Will start her on oxygen at night while sleeping.

## 2013-05-07 NOTE — Telephone Encounter (Signed)
I only have an ONO for a different pt in the green folder

## 2013-05-07 NOTE — Telephone Encounter (Signed)
Yes-nothing more needed at this time on this patient. Thanks.

## 2013-05-07 NOTE — Telephone Encounter (Signed)
Called and spoke with [facility]. States that the pt has already been set up with nocturnal oxygen. Per the order that was placed by [facility].

## 2013-05-07 NOTE — Telephone Encounter (Signed)
So that must mean that I have already discussed ONO with pt? And signed?

## 2013-05-12 ENCOUNTER — Telehealth: Payer: Self-pay | Admitting: *Deleted

## 2013-05-12 NOTE — Telephone Encounter (Signed)
Sent forms for [place] & me for saving on Brilinta and Crestor today.

## 2013-05-14 ENCOUNTER — Ambulatory Visit (INDEPENDENT_AMBULATORY_CARE_PROVIDER_SITE_OTHER): Payer: Medicare Other | Admitting: *Deleted

## 2013-05-14 DIAGNOSIS — Z7901 Long term (current) use of anticoagulants: Secondary | ICD-10-CM | POA: Diagnosis not present

## 2013-05-14 DIAGNOSIS — I2699 Other pulmonary embolism without acute cor pulmonale: Secondary | ICD-10-CM

## 2013-05-14 DIAGNOSIS — Z5181 Encounter for therapeutic drug level monitoring: Secondary | ICD-10-CM

## 2013-05-14 DIAGNOSIS — D6851 Activated protein C resistance: Secondary | ICD-10-CM

## 2013-05-14 DIAGNOSIS — D6859 Other primary thrombophilia: Secondary | ICD-10-CM | POA: Diagnosis not present

## 2013-05-14 LAB — POCT INR: INR: 1.8

## 2013-05-15 NOTE — Telephone Encounter (Signed)
Pt has received O2 and has been using x 1 week.  Pt states that O2 is working great!  Nothing further needed.

## 2013-06-04 ENCOUNTER — Ambulatory Visit (INDEPENDENT_AMBULATORY_CARE_PROVIDER_SITE_OTHER): Payer: Medicare Other | Admitting: *Deleted

## 2013-06-04 DIAGNOSIS — Z7901 Long term (current) use of anticoagulants: Secondary | ICD-10-CM | POA: Diagnosis not present

## 2013-06-04 DIAGNOSIS — D6859 Other primary thrombophilia: Secondary | ICD-10-CM | POA: Diagnosis not present

## 2013-06-04 DIAGNOSIS — Z5181 Encounter for therapeutic drug level monitoring: Secondary | ICD-10-CM

## 2013-06-04 DIAGNOSIS — D6851 Activated protein C resistance: Secondary | ICD-10-CM

## 2013-06-04 DIAGNOSIS — I2699 Other pulmonary embolism without acute cor pulmonale: Secondary | ICD-10-CM | POA: Diagnosis not present

## 2013-06-04 LAB — POCT INR: INR: 1.7

## 2013-06-18 ENCOUNTER — Encounter: Payer: Self-pay | Admitting: Pulmonary Disease

## 2013-07-03 ENCOUNTER — Ambulatory Visit (INDEPENDENT_AMBULATORY_CARE_PROVIDER_SITE_OTHER): Payer: Medicare Other | Admitting: *Deleted

## 2013-07-03 DIAGNOSIS — Z5181 Encounter for therapeutic drug level monitoring: Secondary | ICD-10-CM

## 2013-07-03 DIAGNOSIS — D6859 Other primary thrombophilia: Secondary | ICD-10-CM

## 2013-07-03 DIAGNOSIS — D6851 Activated protein C resistance: Secondary | ICD-10-CM

## 2013-07-03 DIAGNOSIS — Z7901 Long term (current) use of anticoagulants: Secondary | ICD-10-CM

## 2013-07-03 DIAGNOSIS — I2699 Other pulmonary embolism without acute cor pulmonale: Secondary | ICD-10-CM

## 2013-07-03 LAB — POCT INR: INR: 1.7

## 2013-07-10 ENCOUNTER — Encounter (HOSPITAL_COMMUNITY): Payer: Medicare Other | Attending: Pulmonary Disease

## 2013-07-17 ENCOUNTER — Ambulatory Visit (INDEPENDENT_AMBULATORY_CARE_PROVIDER_SITE_OTHER): Payer: Medicare Other | Admitting: *Deleted

## 2013-07-17 DIAGNOSIS — D6851 Activated protein C resistance: Secondary | ICD-10-CM

## 2013-07-17 DIAGNOSIS — I2699 Other pulmonary embolism without acute cor pulmonale: Secondary | ICD-10-CM | POA: Diagnosis not present

## 2013-07-17 DIAGNOSIS — D6859 Other primary thrombophilia: Secondary | ICD-10-CM

## 2013-07-17 DIAGNOSIS — Z7901 Long term (current) use of anticoagulants: Secondary | ICD-10-CM | POA: Diagnosis not present

## 2013-07-17 DIAGNOSIS — Z5181 Encounter for therapeutic drug level monitoring: Secondary | ICD-10-CM | POA: Diagnosis not present

## 2013-07-17 LAB — POCT INR: INR: 2.2

## 2013-08-07 ENCOUNTER — Ambulatory Visit (INDEPENDENT_AMBULATORY_CARE_PROVIDER_SITE_OTHER): Payer: Medicare Other | Admitting: *Deleted

## 2013-08-07 DIAGNOSIS — Z7901 Long term (current) use of anticoagulants: Secondary | ICD-10-CM | POA: Diagnosis not present

## 2013-08-07 DIAGNOSIS — D6851 Activated protein C resistance: Secondary | ICD-10-CM

## 2013-08-07 DIAGNOSIS — I2699 Other pulmonary embolism without acute cor pulmonale: Secondary | ICD-10-CM | POA: Diagnosis not present

## 2013-08-07 DIAGNOSIS — D6859 Other primary thrombophilia: Secondary | ICD-10-CM

## 2013-08-07 DIAGNOSIS — Z5181 Encounter for therapeutic drug level monitoring: Secondary | ICD-10-CM

## 2013-08-07 LAB — POCT INR: INR: 2.2

## 2013-08-11 ENCOUNTER — Other Ambulatory Visit: Payer: Self-pay | Admitting: Internal Medicine

## 2013-08-12 ENCOUNTER — Other Ambulatory Visit: Payer: Self-pay | Admitting: Internal Medicine

## 2013-08-16 ENCOUNTER — Other Ambulatory Visit: Payer: Self-pay | Admitting: Internal Medicine

## 2013-08-23 ENCOUNTER — Encounter (HOSPITAL_COMMUNITY): Payer: Self-pay | Admitting: Emergency Medicine

## 2013-08-23 ENCOUNTER — Inpatient Hospital Stay (HOSPITAL_COMMUNITY)
Admission: EM | Admit: 2013-08-23 | Discharge: 2013-08-24 | Disposition: A | Discharge status: Home / Self Care | Payer: Medicare Other | Source: Home / Self Care | Attending: Internal Medicine | Admitting: Internal Medicine

## 2013-08-23 DIAGNOSIS — Z8051 Family history of malignant neoplasm of kidney: Secondary | ICD-10-CM

## 2013-08-23 DIAGNOSIS — Z9981 Dependence on supplemental oxygen: Secondary | ICD-10-CM

## 2013-08-23 DIAGNOSIS — J9620 Acute and chronic respiratory failure, unspecified whether with hypoxia or hypercapnia: Secondary | ICD-10-CM | POA: Diagnosis present

## 2013-08-23 DIAGNOSIS — Z79899 Other long term (current) drug therapy: Secondary | ICD-10-CM

## 2013-08-23 DIAGNOSIS — G8929 Other chronic pain: Secondary | ICD-10-CM | POA: Diagnosis present

## 2013-08-23 DIAGNOSIS — R7309 Other abnormal glucose: Secondary | ICD-10-CM | POA: Diagnosis present

## 2013-08-23 DIAGNOSIS — D6859 Other primary thrombophilia: Secondary | ICD-10-CM

## 2013-08-23 DIAGNOSIS — Z7901 Long term (current) use of anticoagulants: Secondary | ICD-10-CM

## 2013-08-23 DIAGNOSIS — J189 Pneumonia, unspecified organism: Secondary | ICD-10-CM | POA: Diagnosis present

## 2013-08-23 DIAGNOSIS — I2510 Atherosclerotic heart disease of native coronary artery without angina pectoris: Secondary | ICD-10-CM

## 2013-08-23 DIAGNOSIS — Z9119 Patient's noncompliance with other medical treatment and regimen: Secondary | ICD-10-CM

## 2013-08-23 DIAGNOSIS — E785 Hyperlipidemia, unspecified: Secondary | ICD-10-CM | POA: Diagnosis present

## 2013-08-23 DIAGNOSIS — J984 Other disorders of lung: Secondary | ICD-10-CM | POA: Diagnosis not present

## 2013-08-23 DIAGNOSIS — R079 Chest pain, unspecified: Secondary | ICD-10-CM | POA: Diagnosis not present

## 2013-08-23 DIAGNOSIS — J449 Chronic obstructive pulmonary disease, unspecified: Secondary | ICD-10-CM | POA: Diagnosis not present

## 2013-08-23 DIAGNOSIS — Z91199 Patient's noncompliance with other medical treatment and regimen due to unspecified reason: Secondary | ICD-10-CM

## 2013-08-23 DIAGNOSIS — Z86718 Personal history of other venous thrombosis and embolism: Secondary | ICD-10-CM

## 2013-08-23 DIAGNOSIS — I252 Old myocardial infarction: Secondary | ICD-10-CM | POA: Diagnosis not present

## 2013-08-23 DIAGNOSIS — F17200 Nicotine dependence, unspecified, uncomplicated: Secondary | ICD-10-CM | POA: Diagnosis present

## 2013-08-23 DIAGNOSIS — J9610 Chronic respiratory failure, unspecified whether with hypoxia or hypercapnia: Secondary | ICD-10-CM | POA: Diagnosis not present

## 2013-08-23 DIAGNOSIS — Z883 Allergy status to other anti-infective agents status: Secondary | ICD-10-CM

## 2013-08-23 DIAGNOSIS — D6851 Activated protein C resistance: Secondary | ICD-10-CM

## 2013-08-23 DIAGNOSIS — Z9861 Coronary angioplasty status: Secondary | ICD-10-CM

## 2013-08-23 DIAGNOSIS — J42 Unspecified chronic bronchitis: Secondary | ICD-10-CM | POA: Diagnosis not present

## 2013-08-23 DIAGNOSIS — Z8249 Family history of ischemic heart disease and other diseases of the circulatory system: Secondary | ICD-10-CM

## 2013-08-23 DIAGNOSIS — E039 Hypothyroidism, unspecified: Secondary | ICD-10-CM | POA: Diagnosis present

## 2013-08-23 DIAGNOSIS — Z888 Allergy status to other drugs, medicaments and biological substances status: Secondary | ICD-10-CM | POA: Diagnosis not present

## 2013-08-23 DIAGNOSIS — R072 Precordial pain: Secondary | ICD-10-CM | POA: Diagnosis not present

## 2013-08-23 DIAGNOSIS — J438 Other emphysema: Secondary | ICD-10-CM | POA: Diagnosis not present

## 2013-08-23 DIAGNOSIS — J4489 Other specified chronic obstructive pulmonary disease: Secondary | ICD-10-CM | POA: Diagnosis present

## 2013-08-23 DIAGNOSIS — J432 Centrilobular emphysema: Secondary | ICD-10-CM

## 2013-08-23 DIAGNOSIS — Z86711 Personal history of pulmonary embolism: Secondary | ICD-10-CM

## 2013-08-23 DIAGNOSIS — J439 Emphysema, unspecified: Secondary | ICD-10-CM | POA: Diagnosis present

## 2013-08-23 DIAGNOSIS — J90 Pleural effusion, not elsewhere classified: Secondary | ICD-10-CM | POA: Diagnosis not present

## 2013-08-23 HISTORY — DX: Acute myocardial infarction, unspecified: I21.9

## 2013-08-23 NOTE — ED Notes (Signed)
Presents with left sided chest pain began at 6 pm described as sharp associated with dizziness and SOB, bilateral diminished lung sounds. Pain is non radiating.

## 2013-08-24 ENCOUNTER — Encounter (HOSPITAL_COMMUNITY): Payer: Self-pay | Admitting: Emergency Medicine

## 2013-08-24 ENCOUNTER — Emergency Department (HOSPITAL_COMMUNITY): Payer: Medicare Other

## 2013-08-24 DIAGNOSIS — J449 Chronic obstructive pulmonary disease, unspecified: Secondary | ICD-10-CM | POA: Diagnosis not present

## 2013-08-24 DIAGNOSIS — E785 Hyperlipidemia, unspecified: Secondary | ICD-10-CM

## 2013-08-24 DIAGNOSIS — Z883 Allergy status to other anti-infective agents status: Secondary | ICD-10-CM | POA: Diagnosis not present

## 2013-08-24 DIAGNOSIS — R072 Precordial pain: Secondary | ICD-10-CM | POA: Diagnosis present

## 2013-08-24 DIAGNOSIS — J438 Other emphysema: Secondary | ICD-10-CM

## 2013-08-24 DIAGNOSIS — Z7901 Long term (current) use of anticoagulants: Secondary | ICD-10-CM | POA: Diagnosis not present

## 2013-08-24 DIAGNOSIS — Z9861 Coronary angioplasty status: Secondary | ICD-10-CM | POA: Diagnosis not present

## 2013-08-24 DIAGNOSIS — I252 Old myocardial infarction: Secondary | ICD-10-CM | POA: Diagnosis not present

## 2013-08-24 DIAGNOSIS — F17200 Nicotine dependence, unspecified, uncomplicated: Secondary | ICD-10-CM | POA: Diagnosis not present

## 2013-08-24 DIAGNOSIS — J9620 Acute and chronic respiratory failure, unspecified whether with hypoxia or hypercapnia: Secondary | ICD-10-CM | POA: Diagnosis present

## 2013-08-24 DIAGNOSIS — R7309 Other abnormal glucose: Secondary | ICD-10-CM | POA: Diagnosis present

## 2013-08-24 DIAGNOSIS — J42 Unspecified chronic bronchitis: Secondary | ICD-10-CM | POA: Diagnosis not present

## 2013-08-24 DIAGNOSIS — J90 Pleural effusion, not elsewhere classified: Secondary | ICD-10-CM | POA: Diagnosis not present

## 2013-08-24 DIAGNOSIS — J984 Other disorders of lung: Secondary | ICD-10-CM | POA: Diagnosis not present

## 2013-08-24 DIAGNOSIS — Z8249 Family history of ischemic heart disease and other diseases of the circulatory system: Secondary | ICD-10-CM | POA: Diagnosis not present

## 2013-08-24 DIAGNOSIS — E039 Hypothyroidism, unspecified: Secondary | ICD-10-CM | POA: Diagnosis not present

## 2013-08-24 DIAGNOSIS — R079 Chest pain, unspecified: Secondary | ICD-10-CM | POA: Diagnosis not present

## 2013-08-24 DIAGNOSIS — G8929 Other chronic pain: Secondary | ICD-10-CM | POA: Diagnosis present

## 2013-08-24 DIAGNOSIS — I2510 Atherosclerotic heart disease of native coronary artery without angina pectoris: Secondary | ICD-10-CM | POA: Diagnosis present

## 2013-08-24 DIAGNOSIS — Z8051 Family history of malignant neoplasm of kidney: Secondary | ICD-10-CM | POA: Diagnosis not present

## 2013-08-24 DIAGNOSIS — D6859 Other primary thrombophilia: Secondary | ICD-10-CM | POA: Diagnosis not present

## 2013-08-24 DIAGNOSIS — Z86718 Personal history of other venous thrombosis and embolism: Secondary | ICD-10-CM | POA: Diagnosis not present

## 2013-08-24 DIAGNOSIS — Z86711 Personal history of pulmonary embolism: Secondary | ICD-10-CM | POA: Diagnosis not present

## 2013-08-24 DIAGNOSIS — Z888 Allergy status to other drugs, medicaments and biological substances status: Secondary | ICD-10-CM | POA: Diagnosis not present

## 2013-08-24 DIAGNOSIS — J9610 Chronic respiratory failure, unspecified whether with hypoxia or hypercapnia: Secondary | ICD-10-CM | POA: Diagnosis not present

## 2013-08-24 DIAGNOSIS — J189 Pneumonia, unspecified organism: Secondary | ICD-10-CM | POA: Diagnosis not present

## 2013-08-24 DIAGNOSIS — Z9119 Patient's noncompliance with other medical treatment and regimen: Secondary | ICD-10-CM | POA: Diagnosis not present

## 2013-08-24 DIAGNOSIS — Z79899 Other long term (current) drug therapy: Secondary | ICD-10-CM | POA: Diagnosis not present

## 2013-08-24 DIAGNOSIS — Z9981 Dependence on supplemental oxygen: Secondary | ICD-10-CM | POA: Diagnosis not present

## 2013-08-24 DIAGNOSIS — Z91199 Patient's noncompliance with other medical treatment and regimen due to unspecified reason: Secondary | ICD-10-CM | POA: Diagnosis not present

## 2013-08-24 LAB — TROPONIN I: Troponin I: <0.3 ng/mL (ref <0.30)

## 2013-08-24 LAB — BASIC METABOLIC PANEL
ANION GAP: 11 (ref 5–15)
BUN: 19 mg/dL (ref 6–23)
CO2: 27 mEq/L (ref 19–32)
Calcium: 9.1 mg/dL (ref 8.4–10.5)
Chloride: 102 mEq/L (ref 96–112)
Creatinine, Ser: 1 mg/dL (ref 0.50–1.10)
GFR calc Af Amer: 68 mL/min — ABNORMAL LOW (ref >90)
GFR calc non Af Amer: 59 mL/min — ABNORMAL LOW (ref >90)
Glucose, Bld: 120 mg/dL — ABNORMAL HIGH (ref 70–99)
POTASSIUM: 4.1 meq/L (ref 3.7–5.3)
Sodium: 140 mEq/L (ref 137–147)

## 2013-08-24 LAB — I-STAT TROPONIN, ED: TROPONIN I, POC: 0 ng/mL (ref 0.00–0.08)

## 2013-08-24 LAB — PROTIME-INR
INR: 1.87 — ABNORMAL HIGH (ref 0.00–1.49)
PROTHROMBIN TIME: 21.5 s — AB (ref 11.6–15.2)

## 2013-08-24 LAB — CBC
HCT: 46.3 % — ABNORMAL HIGH (ref 36.0–46.0)
HEMOGLOBIN: 15.8 g/dL — AB (ref 12.0–15.0)
MCH: 31.9 pg (ref 26.0–34.0)
MCHC: 34.1 g/dL (ref 30.0–36.0)
MCV: 93.3 fL (ref 78.0–100.0)
PLATELETS: 248 10*3/uL (ref 150–400)
RBC: 4.96 MIL/uL (ref 3.87–5.11)
RDW: 15.6 % — ABNORMAL HIGH (ref 11.5–15.5)
WBC: 6.7 10*3/uL (ref 4.0–10.5)

## 2013-08-24 LAB — MRSA PCR SCREENING: MRSA BY PCR: NEGATIVE

## 2013-08-24 MED ORDER — TIOTROPIUM BROMIDE MONOHYDRATE 18 MCG IN CAPS
18.0000 ug | ORAL_CAPSULE | Freq: Every day | RESPIRATORY_TRACT | Status: DC
  Filled 2013-08-24: qty 5

## 2013-08-24 MED ORDER — NITROGLYCERIN 2 % TD OINT
1.0000 [in_us] | TOPICAL_OINTMENT | Freq: Four times a day (QID) | TRANSDERMAL | Status: DC
  Administered 2013-08-24 (×2): 1 [in_us] via TOPICAL
  Filled 2013-08-24: qty 1

## 2013-08-24 MED ORDER — SODIUM CHLORIDE 0.9 % IJ SOLN: 3.0000 mL | Freq: Two times a day (BID) | INTRAMUSCULAR | Status: DC

## 2013-08-24 MED ORDER — IPRATROPIUM BROMIDE 0.02 % IN SOLN
0.5000 mg | Freq: Once | RESPIRATORY_TRACT | Status: AC
  Administered 2013-08-24: 0.5 mg via RESPIRATORY_TRACT
  Filled 2013-08-24: qty 2.5

## 2013-08-24 MED ORDER — ACETAMINOPHEN 650 MG RE SUPP: 650.0000 mg | Freq: Four times a day (QID) | RECTAL | Status: DC | PRN

## 2013-08-24 MED ORDER — NITROGLYCERIN 0.4 MG SL SUBL: 0.4000 mg | SUBLINGUAL_TABLET | SUBLINGUAL | Status: DC | PRN

## 2013-08-24 MED ORDER — ONDANSETRON HCL 4 MG/2ML IJ SOLN
4.0000 mg | Freq: Once | INTRAMUSCULAR | Status: AC
  Administered 2013-08-24: 4 mg via INTRAVENOUS
  Filled 2013-08-24: qty 2

## 2013-08-24 MED ORDER — WARFARIN SODIUM 5 MG PO TABS: 5.0000 mg | ORAL_TABLET | Freq: Every day | ORAL | Status: DC

## 2013-08-24 MED ORDER — DIPHENHYDRAMINE HCL 50 MG/ML IJ SOLN
12.5000 mg | Freq: Once | INTRAMUSCULAR | Status: AC
  Administered 2013-08-24: 12.5 mg via INTRAVENOUS
  Filled 2013-08-24: qty 1

## 2013-08-24 MED ORDER — TICAGRELOR 90 MG PO TABS
90.0000 mg | ORAL_TABLET | Freq: Two times a day (BID) | ORAL | Status: DC
  Filled 2013-08-24 (×2): qty 1

## 2013-08-24 MED ORDER — OMEGA-3 FATTY ACIDS 1000 MG PO CAPS: 2.0000 g | ORAL_CAPSULE | Freq: Every day | ORAL | Status: DC

## 2013-08-24 MED ORDER — HYDROMORPHONE HCL PF 1 MG/ML IJ SOLN
1.0000 mg | Freq: Once | INTRAMUSCULAR | Status: AC
  Administered 2013-08-24: 1 mg via INTRAVENOUS
  Filled 2013-08-24: qty 1

## 2013-08-24 MED ORDER — ATORVASTATIN CALCIUM 80 MG PO TABS
80.0000 mg | ORAL_TABLET | Freq: Every day | ORAL | Status: DC
  Filled 2013-08-24: qty 1

## 2013-08-24 MED ORDER — RANOLAZINE ER 500 MG PO TB12
500.0000 mg | ORAL_TABLET | Freq: Two times a day (BID) | ORAL | Status: DC
  Filled 2013-08-24 (×2): qty 1

## 2013-08-24 MED ORDER — LEVOTHYROXINE SODIUM 125 MCG PO TABS
125.0000 ug | ORAL_TABLET | Freq: Every day | ORAL | Status: DC
  Filled 2013-08-24 (×2): qty 1

## 2013-08-24 MED ORDER — ONDANSETRON HCL 4 MG PO TABS: 4.0000 mg | ORAL_TABLET | Freq: Four times a day (QID) | ORAL | Status: DC | PRN

## 2013-08-24 MED ORDER — ONDANSETRON HCL 4 MG/2ML IJ SOLN: 4.0000 mg | Freq: Four times a day (QID) | INTRAMUSCULAR | Status: DC | PRN

## 2013-08-24 MED ORDER — SODIUM CHLORIDE 0.9 % IJ SOLN: 3.0000 mL | INTRAMUSCULAR | Status: DC | PRN

## 2013-08-24 MED ORDER — ACETAMINOPHEN 325 MG PO TABS: 650.0000 mg | ORAL_TABLET | Freq: Four times a day (QID) | ORAL | Status: DC | PRN

## 2013-08-24 MED ORDER — OMEGA-3-ACID ETHYL ESTERS 1 G PO CAPS
1.0000 g | ORAL_CAPSULE | Freq: Every day | ORAL | Status: DC
  Filled 2013-08-24: qty 1

## 2013-08-24 MED ORDER — ALUM & MAG HYDROXIDE-SIMETH 200-200-20 MG/5ML PO SUSP: 30.0000 mL | Freq: Four times a day (QID) | ORAL | Status: DC | PRN

## 2013-08-24 MED ORDER — OXYCODONE HCL 5 MG PO TABS
5.0000 mg | ORAL_TABLET | ORAL | Status: DC | PRN
  Filled 2013-08-24 (×6): qty 1

## 2013-08-24 MED ORDER — NICOTINE 14 MG/24HR TD PT24
14.0000 mg | MEDICATED_PATCH | Freq: Every day | TRANSDERMAL | Status: DC
  Filled 2013-08-24: qty 1

## 2013-08-24 MED ORDER — SODIUM CHLORIDE 0.9 % IV SOLN: 250.0000 mL | INTRAVENOUS | Status: DC | PRN

## 2013-08-24 MED ORDER — WARFARIN - PHARMACIST DOSING INPATIENT: Freq: Every day | Status: DC

## 2013-08-24 MED ORDER — ENOXAPARIN SODIUM 150 MG/ML ~~LOC~~ SOLN: 1.0000 mg/kg | Freq: Two times a day (BID) | SUBCUTANEOUS | Status: DC

## 2013-08-24 MED ORDER — WARFARIN SODIUM 7.5 MG PO TABS
7.5000 mg | ORAL_TABLET | Freq: Once | ORAL | Status: DC
  Filled 2013-08-24: qty 1

## 2013-08-24 MED ORDER — ASPIRIN 81 MG PO CHEW
324.0000 mg | CHEWABLE_TABLET | Freq: Once | ORAL | Status: AC
  Administered 2013-08-24: 324 mg via ORAL
  Filled 2013-08-24: qty 4

## 2013-08-24 MED ORDER — MORPHINE SULFATE 4 MG/ML IJ SOLN
4.0000 mg | Freq: Once | INTRAMUSCULAR | Status: AC
  Administered 2013-08-24: 4 mg via INTRAVENOUS
  Filled 2013-08-24: qty 1

## 2013-08-24 MED ORDER — FENTANYL CITRATE 0.05 MG/ML IJ SOLN: 50.0000 ug | INTRAMUSCULAR | Status: DC | PRN

## 2013-08-24 MED ORDER — ALBUTEROL SULFATE (2.5 MG/3ML) 0.083% IN NEBU
5.0000 mg | INHALATION_SOLUTION | Freq: Once | RESPIRATORY_TRACT | Status: AC
  Administered 2013-08-24: 5 mg via RESPIRATORY_TRACT
  Filled 2013-08-24: qty 6

## 2013-08-24 NOTE — H&P (Signed)
[facility] Admission History and Physical       [name] NMM:[id number] DOB: 08-27-1950 DOA: 08/23/2013  Referring physician:  PCP: Drema Pry, DO  Specialists:    Chief Complaint:  Chest Pain and SOB  HPI: [name] is a 63 y.o. female with a history of CAD/MI S/P PCI and Stent placement, Factor V Leiden Deficiency Recurrent PEs, on Chronic Coumadin RX who presents tot he ED with complaints of 10/10 substernal Chest pain that started a t 10 pm tonight.   She reports having associated SOB, Nausea and Diaphoresis.   She is a smoker, and is currently sub-therapeutic on her coumadin on admission.  A CTA of the Chest has also been ordered.       Review of Systems:  Constitutional: No Weight Loss, No Weight Gain, Night Sweats, Fevers, Chills, Dizziness, Fatigue, or Generalized Weakness HEENT: No Headaches, Difficulty Swallowing,Tooth/Dental Problems,Sore Throat,  No Sneezing, Rhinitis, Ear Ache, Nasal Congestion, or Post Nasal Drip,  Cardio-vascular:   +Chest pain, Orthopnea, PND, Edema in Lower Extremities, Anasarca, Dizziness, Palpitations  Resp: No Dyspnea, No DOE, No Productive Cough, No Non-Productive Cough, No Hemoptysis, No Wheezing.    GI: No Heartburn, Indigestion, Abdominal Pain, +Nausea, Vomiting, Diarrhea, Hematemesis, Hematochezia, Melena, Change in Bowel Habits,  Loss of Appetite  GU: No Dysuria, Change in Color of Urine, No Urgency or Frequency, No Flank pain.  Musculoskeletal: No Joint Pain or Swelling, No Decreased Range of Motion, No Back Pain.  Neurologic: No Syncope, No Seizures, Muscle Weakness, Paresthesia, Vision Disturbance or Loss, No Diplopia, No Vertigo, No Difficulty Walking,  Skin: No Rash or Lesions. Psych: No Change in Mood or Affect, No Depression or Anxiety, No Memory loss, No Confusion, or Hallucinations   Past Medical History  Diagnosis Date  . Arteriosclerotic cardiovascular disease (ASCVD) 2002    Inf STEMI-2002. 2003-cutting balloon  + brachytherapy for restenosis; subsequent acute stent thrombosis 06/2010 requiring 2 separate interventions (Zeta stent, then repeat cath with thrombectomy). focal basal inf AK, nl EF; 03/2011: Patent stents, minor nonobst  residual dz, nl EF; neg stress nuclear in 2008 and stress echo in 2009  . Pulmonary embolism 2006    Associated with deep vein thrombosis-2006; + factor V Leiden  . Factor V Leiden, prothrombin gene mutation 2006  . Hyperlipidemia   . COPD (chronic obstructive pulmonary disease)   . Tobacco abuse     50 pack years  . Hypothyroidism   . Noncompliance   . Pelvic fracture 2009  . Chronic anticoagulation     Warfarin plus ticagrelor  . Acute MI     x4, code blue x3     Past Surgical History  Procedure Laterality Date  . Coronary angioplasty  2002, 2003, 2012  . Colonoscopy  Approximately 2000    Negative screening study      Prior to Admission medications   Medication Sig Start Date End Date Taking? Authorizing Provider  albuterol (PROVENTIL HFA;VENTOLIN HFA) 108 (90 BASE) MCG/ACT inhaler Inhale 2 puffs into the lungs every 4 (four) hours as needed for wheezing or shortness of breath.   Yes Historical Provider, MD  fish oil-omega-3 fatty acids 1000 MG capsule Take 2 g by mouth daily.     Yes Historical Provider, MD  levothyroxine (SYNTHROID, LEVOTHROID) 125 MCG tablet Take 125 mcg by mouth daily before breakfast.   Yes Historical Provider, MD  nitroGLYCERIN (NITROSTAT) 0.4 MG SL tablet Place 0.4 mg under the tongue every 5 (five) minutes as needed for  chest pain.   Yes Historical Provider, MD  ranolazine (RANEXA) 500 MG 12 hr tablet Take 500 mg by mouth 2 (two) times daily.   Yes Historical Provider, MD  rosuvastatin (CRESTOR) 40 MG tablet Take 40 mg by mouth daily.   Yes Historical Provider, MD  ticagrelor (BRILINTA) 90 MG TABS tablet Take 90 mg by mouth 2 (two) times daily.   Yes Historical Provider, MD  tiotropium (SPIRIVA) 18 MCG inhalation capsule Place 18 mcg  into inhaler and inhale daily.   Yes Historical Provider, MD  warfarin (COUMADIN) 5 MG tablet Take 5-7.5 mg by mouth daily. Take 7.5mg  on Sun, Wed, & Friday.  Take 5mg  on Mon, Tues, Thurs, Sat   Yes Historical Provider, MD      Allergies  Allergen Reactions  . Minocycline Hcl     REACTION: Dizzy  . Prednisone     REACTION: feels like throat swelling  . Varenicline Tartrate     REACTION: Dizzy(chantix)   . Zocor [Simvastatin - High Dose] Other (See Comments)    myalgia     Social History:  reports that she has quit smoking. Her smoking use included Cigarettes. She has a 50 pack-year smoking history. She has never used smokeless tobacco. She reports that she does not drink alcohol or use illicit drugs.     Family History  Problem Relation Age of Onset  . Emphysema Mother   . Emphysema Sister   . Heart disease Father   . Factor V Leiden deficiency Father   . Factor V Leiden deficiency Sister   . Factor V Leiden deficiency Daughter   . Kidney cancer Paternal Grandmother        Physical Exam:  GEN:  Pleasant Well Developed  63 y.o. Caucasian female examined and in no acute distress; cooperative with exam Filed Vitals:   08/24/13 0300 08/24/13 0315 08/24/13 0424 08/24/13 0529  BP: 118/77 111/61 109/55 109/63  Pulse: 82 69 70 78  Temp:   98 F (36.7 C)   TempSrc:   Oral   Resp:   18 13  SpO2:  95% 93% 95%   Blood pressure 109/63, pulse 78, temperature 98 F (36.7 C), temperature source Oral, resp. rate 13, SpO2 95.00%. PSYCH: She is alert and oriented x4; does not appear anxious does not appear depressed; affect is normal HEENT: Normocephalic and Atraumatic, Mucous membranes pink; PERRLA; EOM intact; Fundi:  Benign;  No scleral icterus, Nares: Patent, Oropharynx: Clear, Fair Dentition,    Neck:  FROM, No Cervical Lymphadenopathy nor Thyromegaly or Carotid Bruit; No JVD; Breasts:: Not examined CHEST WALL: No tenderness CHEST: Normal respiration, clear to auscultation  bilaterally HEART: Regular rate and rhythm; no murmurs rubs or gallops BACK: No kyphosis or scoliosis; No CVA tenderness ABDOMEN: Positive Bowel Sounds, Obese, Soft Non-Tender; No Masses, No Organomegaly. Rectal Exam: Not done EXTREMITIES: No Cyanosis, Clubbing, or Edema; No Ulcerations. Genitalia: not examined PULSES: 2+ and symmetric SKIN: Normal hydration no rash or ulceration CNS: Alert and oriented X 4, No Focal Deficits  Vascular: pulses palpable throughout    Labs on Admission:  Basic Metabolic Panel:  Recent Labs Lab 08/24/13 0007  NA 140  K 4.1  CL 102  CO2 27  GLUCOSE 120*  BUN 19  CREATININE 1.00  CALCIUM 9.1   Liver Function Tests: No results found for this basename: AST, ALT, ALKPHOS, BILITOT, PROT, ALBUMIN,  in the last 168 hours No results found for this basename: LIPASE, AMYLASE,  in the last 168  hours No results found for this basename: AMMONIA,  in the last 168 hours CBC:  Recent Labs Lab 08/24/13 0007  WBC 6.7  HGB 15.8*  HCT 46.3*  MCV 93.3  PLT 248   Cardiac Enzymes:  Recent Labs Lab 08/24/13 0330  TROPONINI <0.30    BNP (last 3 results) No results found for this basename: PROBNP,  in the last 8760 hours CBG: No results found for this basename: GLUCAP,  in the last 168 hours  Radiological Exams on Admission: Dg Chest Port 1 View  08/24/2013   CLINICAL DATA:  Chest pain.  EXAM: PORTABLE CHEST - 1 VIEW  COMPARISON:  Chest x-ray 06/13/2011.  FINDINGS: There is hyperinflation of the lungs compatible with COPD. Heart is normal size. Left base atelectasis or scarring. Right lung is clear. No effusions. No acute bony abnormality.  IMPRESSION: COPD.  Left basilar atelectasis or scarring.   Electronically Signed   By: Rolm Baptise M.D.   On: 08/24/2013 00:25     EKG: Independently reviewed.    Assessment/Plan:   63 y.o. female with  Principal Problem:   1.   Chest pain   Cardiac Monitoring   Cycle Troponins, Nitr5oapste, O2 , ASA, and  on Coumadin  Rx, continue Statin Rx   CTA Chest Ordered due to Sxs and Hx of Factor V Leiden Deficiency and Sub-therapeutic Comadin   Active Problems:   2.   Chronic obstructive pulmonary disease   Albuterol Nebs   O2 PRN     3.    Factor V Leiden, prothrombin gene mutation     4.    Chronic anticoagulation- currently Sub-therapeutic   Adjust Coumadin Per Pharmacy     5.   HYPOTHYROIDISM   Continue Levothyroxine Rx     6.   Hyperlipidemia   Continue Stain Rx and Omega 3 Fatty Acids      7.   TOBACCO ABUSE   Nicotine Patch     Code Status:    FULL CODE Family Communication:    Family at bedside Disposition Plan:     Inpatient  Time spent:  [facility] Pager [phone]   If [place] Please Contact the Day Rounding Team MD for [facility]  If 7PM-7AM, Please Contact night-coverage  [email or web address] Password [email or web address] 08/24/2013, 6:01 AM

## 2013-08-24 NOTE — ED Provider Notes (Signed)
CSN: [id number]     Arrival date & time 08/23/13  2341 History   First MD Initiated Contact with Patient 08/24/13 0006     Chief Complaint  Patient presents with  . Chest Pain     (Consider location/radiation/quality/duration/timing/severity/associated sxs/prior Treatment)   HPI This patient is a 63 year old [name] with an extensive cardiac history including multiple previous myocardial infarctions, status post multiple interventions also has a history of pulmonary Lissa Merlin secondary to factor V Leiden deficient C. She has a history of COPD, hyperlipidemia and ongoing tobacco use. She is anticoagulated with warfarin.  She comes in today with complaints of left-sided chest pain which began around 6 PM. Her pain as sharp and associated with some shortness of breath. Her pain has been persistent. It was initially severe. It has improved to 6 0-to-10 scale. Patient denies cough and fever.  She reports compliance with Coumadin. Nothing seems to make her pain worse or better. She has not had orthopnea. She has not appreciated lower STEMI edema. She reports compliance with all medications.  Past Medical History  Diagnosis Date  . Arteriosclerotic cardiovascular disease (ASCVD) 2002    Inf STEMI-2002. 2003-cutting balloon + brachytherapy for restenosis; subsequent acute stent thrombosis 06/2010 requiring 2 separate interventions (Zeta stent, then repeat cath with thrombectomy). focal basal inf AK, nl EF; 03/2011: Patent stents, minor nonobst  residual dz, nl EF; neg stress nuclear in 2008 and stress echo in 2009  . Pulmonary embolism 2006    Associated with deep vein thrombosis-2006; + factor V Leiden  . Factor V Leiden, prothrombin gene mutation 2006  . Hyperlipidemia   . COPD (chronic obstructive pulmonary disease)   . Tobacco abuse     50 pack years  . Hypothyroidism   . Noncompliance   . Pelvic fracture 2009  . Chronic anticoagulation     Warfarin plus ticagrelor  . Acute MI     x4, code  blue x3   Past Surgical History  Procedure Laterality Date  . Coronary angioplasty  2002, 2003, 2012  . Colonoscopy  Approximately 2000    Negative screening study   Family History  Problem Relation Age of Onset  . Emphysema Mother   . Emphysema Sister   . Heart disease Father   . Factor V Leiden deficiency Father   . Factor V Leiden deficiency Sister   . Factor V Leiden deficiency Daughter   . Kidney cancer Paternal Grandmother    History  Substance Use Topics  . Smoking status: Former Smoker -- 1.00 packs/day for 50 years    Types: Cigarettes  . Smokeless tobacco: Never Used     Comment: has not had a cig in 4 months as of 02-27-13  . Alcohol Use: No   OB History   Grav Para Term Preterm Abortions TAB SAB Ect Mult Living                 Review of Systems  10 point review of symptoms obtained and is negative with the exceptions of symptoms noted abov.e   Allergies  Minocycline hcl; Prednisone; Varenicline tartrate; and Zocor  Home Medications   Prior to Admission medications   Medication Sig Start Date End Date Taking? Authorizing Provider  fish oil-omega-3 fatty acids 1000 MG capsule Take 2 g by mouth daily.      Historical Provider, MD  nitroGLYCERIN (NITROSTAT) 0.4 MG SL tablet Place 1 tablet (0.4 mg total) under the tongue every 5 (five) minutes as needed for chest pain.  05/01/11   Donney Dice, PA-C  ranolazine (RANEXA) 500 MG 12 hr tablet Take 1 tablet (500 mg total) by mouth 2 (two) times daily. 02/27/13   Herminio Commons, MD  rosuvastatin (CRESTOR) 40 MG tablet Take 1 tablet (40 mg total) by mouth daily. 08/29/12   Lendon Colonel, NP  Ticagrelor (BRILINTA) 90 MG TABS tablet Take 1 tablet (90 mg total) by mouth 2 (two) times daily. 08/29/12   Lendon Colonel, NP  tiotropium (SPIRIVA) 18 MCG inhalation capsule Place 1 capsule (18 mcg total) into inhaler and inhale daily. 10/30/11   Kathee Delton, MD  warfarin (COUMADIN) 5 MG tablet TAKE ONE TABLET (5MG )  BY MOUTH DAILY EXCEPT TAKE ONE AND ONE-HALF TABLETS (7.5MG ) ON SUNDAYS,WEDNESDAYS AND FRIDAYS OR AS DIRECTED 05/01/13   Lendon Colonel, NP   BP 109/55  Pulse 70  Temp(Src) 98 F (36.7 C) (Oral)  Resp 18  SpO2 93% Physical Exam Gen: well nourished and well developed appearing Head: NCAT Ears: normal to inspection Nose: normal to inspection, no epistaxis or drainage Mouth: oral mucsoa is well hydrated appearing, normal posterior oropharynx Neck: supple, no stridor CV: RRR, no murmur, palpable peripheral pulses Resp: lung sounds are clear to auscultation bilaterally, scattered wheezing, no rhonchi or rales, no respiratory distress  Abd: soft, nontender, nondistended, obese Extremities: normal to inspection.  Skin: warm and dry Neuro: CN ii - XII, no focal deficitis Psyche; normal affect, cooperative.   ED Course  Procedures (including critical care time) Labs Review  Results for orders placed during the hospital encounter of 08/23/13 (from the past 24 hour(s))  CBC     Status: Abnormal   Collection Time    08/24/13 12:07 AM      Result Value Ref Range   WBC 6.7  4.0 - 10.5 K/uL   RBC 4.96  3.87 - 5.11 MIL/uL   Hemoglobin 15.8 (*) 12.0 - 15.0 g/dL   HCT 46.3 (*) 36.0 - 46.0 %   MCV 93.3  78.0 - 100.0 fL   MCH 31.9  26.0 - 34.0 pg   MCHC 34.1  30.0 - 36.0 g/dL   RDW 15.6 (*) 11.5 - 15.5 %   Platelets 248  150 - 400 K/uL  BASIC METABOLIC PANEL     Status: Abnormal   Collection Time    08/24/13 12:07 AM      Result Value Ref Range   Sodium 140  137 - 147 mEq/L   Potassium 4.1  3.7 - 5.3 mEq/L   Chloride 102  96 - 112 mEq/L   CO2 27  19 - 32 mEq/L   Glucose, Bld 120 (*) 70 - 99 mg/dL   BUN 19  6 - 23 mg/dL   Creatinine, Ser 1.00  0.50 - 1.10 mg/dL   Calcium 9.1  8.4 - 10.5 mg/dL   GFR calc non Af Amer 59 (*) >90 mL/min   GFR calc Af Amer 68 (*) >90 mL/min   Anion gap 11  5 - 15  PROTIME-INR     Status: Abnormal   Collection Time    08/24/13 12:07 AM      Result  Value Ref Range   Prothrombin Time 21.5 (*) 11.6 - 15.2 seconds   INR 1.87 (*) 0.00 - 1.49  I-STAT TROPOININ, ED     Status: None   Collection Time    08/24/13 12:11 AM      Result Value Ref Range   Troponin i, poc 0.00  0.00 - 0.08 ng/mL   Comment 3           TROPONIN I     Status: None   Collection Time    08/24/13  3:30 AM      Result Value Ref Range   Troponin I <0.30  <0.30 ng/mL    Imaging Review Dg Chest Port 1 View  08/24/2013   CLINICAL DATA:  Chest pain.  EXAM: PORTABLE CHEST - 1 VIEW  COMPARISON:  Chest x-ray 06/13/2011.  FINDINGS: There is hyperinflation of the lungs compatible with COPD. Heart is normal size. Left base atelectasis or scarring. Right lung is clear. No effusions. No acute bony abnormality.  IMPRESSION: COPD.  Left basilar atelectasis or scarring.   Electronically Signed   By: Rolm Baptise M.D.   On: 08/24/2013 00:25    EKG: nsr, no acute ischemic changes, normal intervals, normal axis, normal qrs complex  MDM   DDX: ACS, pneumothorax, pneumonia, pericardial or pleural effusion, gastritis, GERD/PUD, musculoskeletal pain, pulmonary embolus.   The patient has persistent recurrent of chest pain. She has had two sets of negative troponins. She is noted to be mildly subtherapeutic on Coumadin. We will obtain CTA chest to evaluate for pulmonary embolus. The patient has been treated with ASA and NTG. She is currently pain free. Case discussed with Dr. Arnoldo Morale who has accepted the patient to the SDU.     Elyn Peers, MD 08/24/13 [phone]

## 2013-08-24 NOTE — Progress Notes (Signed)
Patient at this time expresses desire to leave the Hospital immidiately, patient has been warned that this is not Medically advisable at this time, and can result in Medical complications like Death and Disability, patient understands and accepts the risks involved and assumes full responsibilty of this decision. 

## 2013-08-24 NOTE — Progress Notes (Addendum)
ANTICOAGULATION CONSULT NOTE - Initial Consult  Pharmacy Consult for coumadin Indication: Hx of PE/FVL  Allergies  Allergen Reactions  . Dilaudid [Hydromorphone Hcl] Hives and Nausea Only  . Minocycline Hcl     REACTION: Dizzy  . Prednisone     REACTION: feels like throat swelling  . Varenicline Tartrate     REACTION: Dizzy(chantix)   . Zocor [Simvastatin - High Dose] Other (See Comments)    myalgia    Patient Measurements:     Vital Signs: Temp: 98 F (36.7 C) (08/16 0424) Temp src: Oral (08/16 0424) BP: 109/63 mmHg (08/16 0529) Pulse Rate: 78 (08/16 0529)  Labs:  Recent Labs  08/24/13 0007 08/24/13 0330  HGB 15.8*  --   HCT 46.3*  --   PLT 248  --   LABPROT 21.5*  --   INR 1.87*  --   CREATININE 1.00  --   TROPONINI  --  <0.30    The CrCl is unknown because both a height and weight (above a minimum accepted value) are required for this calculation.   Medical History: Past Medical History  Diagnosis Date  . Arteriosclerotic cardiovascular disease (ASCVD) 2002    Inf STEMI-2002. 2003-cutting balloon + brachytherapy for restenosis; subsequent acute stent thrombosis 06/2010 requiring 2 separate interventions (Zeta stent, then repeat cath with thrombectomy). focal basal inf AK, nl EF; 03/2011: Patent stents, minor nonobst  residual dz, nl EF; neg stress nuclear in 2008 and stress echo in 2009  . Pulmonary embolism 2006    Associated with deep vein thrombosis-2006; + factor V Leiden  . Factor V Leiden, prothrombin gene mutation 2006  . Hyperlipidemia   . COPD (chronic obstructive pulmonary disease)   . Tobacco abuse     50 pack years  . Hypothyroidism   . Noncompliance   . Pelvic fracture 2009  . Chronic anticoagulation     Warfarin plus ticagrelor  . Acute MI     x4, code blue x3    Medications:  7.5mg  on Sun/Wed/Fri 5mg  on M/Tu/TR/Sat  Assessment: 63 year old female with history of PE and factor V Liden on chronic coumadin. INR subtherapeutic  (1.8) on admission. Last dose of warfarin was last night, will give a little extra tonight. Continue daily INR for now.  Goal of Therapy:  INR 2-3 Monitor platelets by anticoagulation protocol: Yes   Plan:  Warfarin 7.5mg  tonight Daily INR  Erin Hearing PharmD., BCPS Clinical Pharmacist Pager [phone] 08/24/2013 6:40 AM

## 2013-08-24 NOTE — Discharge Summary (Signed)
PATIENT DETAILS Name: [name] Age: 63 y.o. Sex: female Date of Birth: [birth year] MRN: [id number]. Admit Date: 08/23/2013 Admitting Physician: Theressa Millard, MD HER:DEYCXK Shawna Orleans, DO  Please note patient signed out against medical advice  Recommendations for Outpatient Follow-up:  1. Admitted with chest pain, however left against medical advise. Please evaluate closely at next visit.  PRIMARY DISCHARGE DIAGNOSIS:  Principal Problem:   Chest pain Active Problems:   HYPOTHYROIDISM   TOBACCO ABUSE   Chronic obstructive pulmonary disease   Factor V Leiden, prothrombin gene mutation   Hyperlipidemia   Chronic anticoagulation      PAST MEDICAL HISTORY: Past Medical History  Diagnosis Date  . Arteriosclerotic cardiovascular disease (ASCVD) 2002    Inf STEMI-2002. 2003-cutting balloon + brachytherapy for restenosis; subsequent acute stent thrombosis 06/2010 requiring 2 separate interventions (Zeta stent, then repeat cath with thrombectomy). focal basal inf AK, nl EF; 03/2011: Patent stents, minor nonobst  residual dz, nl EF; neg stress nuclear in 2008 and stress echo in 2009  . Pulmonary embolism 2006    Associated with deep vein thrombosis-2006; + factor V Leiden  . Factor V Leiden, prothrombin gene mutation 2006  . Hyperlipidemia   . COPD (chronic obstructive pulmonary disease)   . Tobacco abuse     50 pack years  . Hypothyroidism   . Noncompliance   . Pelvic fracture 2009  . Chronic anticoagulation     Warfarin plus ticagrelor  . Acute MI     x4, code blue x3    DISCHARGE MEDICATIONS:   Medication List    ASK your doctor about these medications       albuterol 108 (90 BASE) MCG/ACT inhaler  Commonly known as:  PROVENTIL HFA;VENTOLIN HFA  Inhale 2 puffs into the lungs every 4 (four) hours as needed for wheezing or shortness of breath.     fish oil-omega-3 fatty acids 1000 MG capsule  Take 2 g by mouth daily.     levothyroxine 125 MCG tablet  Commonly known  as:  SYNTHROID, LEVOTHROID  Take 125 mcg by mouth daily before breakfast.     nitroGLYCERIN 0.4 MG SL tablet  Commonly known as:  NITROSTAT  Place 0.4 mg under the tongue every 5 (five) minutes as needed for chest pain.     ranolazine 500 MG 12 hr tablet  Commonly known as:  RANEXA  Take 500 mg by mouth 2 (two) times daily.     rosuvastatin 40 MG tablet  Commonly known as:  CRESTOR  Take 40 mg by mouth daily.     ticagrelor 90 MG Tabs tablet  Commonly known as:  BRILINTA  Take 90 mg by mouth 2 (two) times daily.     tiotropium 18 MCG inhalation capsule  Commonly known as:  SPIRIVA  Place 18 mcg into inhaler and inhale daily.     warfarin 5 MG tablet  Commonly known as:  COUMADIN  - Take 5-7.5 mg by mouth daily. Take 7.5mg  on Sun, Wed, & Friday.   - Take 5mg  on Mon, Tues, Thurs, Sat        ALLERGIES:   Allergies  Allergen Reactions  . Dilaudid [Hydromorphone Hcl] Hives and Nausea Only  . Minocycline Hcl     REACTION: Dizzy  . Prednisone     REACTION: feels like throat swelling  . Varenicline Tartrate     REACTION: Dizzy(chantix)   . Zocor [Simvastatin - High Dose] Other (See Comments)    myalgia  BRIEF HPI:  See H&P, Labs, Consult and Test reports for all details in brief, patient is a 63 y.o. female with a history of CAD/MI S/P PCI and Stent placement, Factor V Leiden Deficiency Recurrent PEs, on Chronic Coumadin RX who presented to the ED with complaints of 10/10 substernal Chest pain. She was admitted for further evaluation and treatment    CONSULTATIONS:   None  PERTINENT RADIOLOGIC STUDIES: Dg Chest Port 1 View  08/24/2013   CLINICAL DATA:  Chest pain.  EXAM: PORTABLE CHEST - 1 VIEW  COMPARISON:  Chest x-ray 06/13/2011.  FINDINGS: There is hyperinflation of the lungs compatible with COPD. Heart is normal size. Left base atelectasis or scarring. Right lung is clear. No effusions. No acute bony abnormality.  IMPRESSION: COPD.  Left basilar atelectasis or  scarring.   Electronically Signed   By: Rolm Baptise M.D.   On: 08/24/2013 00:25     PERTINENT LAB RESULTS: CBC:  Recent Labs  08/24/13 0007  WBC 6.7  HGB 15.8*  HCT 46.3*  PLT 248   CMET CMP     Component Value Date/Time   NA 140 08/24/2013 0007   K 4.1 08/24/2013 0007   CL 102 08/24/2013 0007   CO2 27 08/24/2013 0007   GLUCOSE 120* 08/24/2013 0007   BUN 19 08/24/2013 0007   CREATININE 1.00 08/24/2013 0007   CALCIUM 9.1 08/24/2013 0007   PROT 7.6 05/15/2011 1526   ALBUMIN 3.7 05/15/2011 1526   AST 18 05/15/2011 1526   ALT 18 05/15/2011 1526   ALKPHOS 94 05/15/2011 1526   BILITOT 0.5 05/15/2011 1526   GFRNONAA 59* 08/24/2013 0007   GFRAA 68* 08/24/2013 0007    GFR The CrCl is unknown because both a height and weight (above a minimum accepted value) are required for this calculation. No results found for this basename: LIPASE, AMYLASE,  in the last 72 hours  Recent Labs  08/24/13 0330  TROPONINI <0.30   No components found with this basename: POCBNP,  No results found for this basename: DDIMER,  in the last 72 hours No results found for this basename: HGBA1C,  in the last 72 hours No results found for this basename: CHOL, HDL, LDLCALC, TRIG, CHOLHDL, LDLDIRECT,  in the last 72 hours No results found for this basename: TSH, T4TOTAL, FREET3, T3FREE, THYROIDAB,  in the last 72 hours No results found for this basename: VITAMINB12, FOLATE, FERRITIN, TIBC, IRON, RETICCTPCT,  in the last 72 hours Coags:  Recent Labs  08/24/13 0007  INR 1.87*   Microbiology: Recent Results (from the past 240 hour(s))  MRSA PCR SCREENING     Status: None   Collection Time    08/24/13  6:48 AM      Result Value Ref Range Status   MRSA by PCR NEGATIVE  NEGATIVE Final   Comment:            The GeneXpert MRSA Assay (FDA     approved for NASAL specimens     only), is one component of a     comprehensive MRSA colonization     surveillance program. It is not     intended to diagnose MRSA      infection nor to guide or     monitor treatment for     MRSA infections.     BRIEF HOSPITAL COURSE:   Principal Problem:   Chest pain Active Problems:   HYPOTHYROIDISM   TOBACCO ABUSE   Chronic obstructive pulmonary disease  Factor V Leiden, prothrombin gene mutation   Hyperlipidemia   Chronic anticoagulation      Patient is a 63 year old female with significant history of coronary artery disease status post prior PCI, history of pulmonary embolism on chronic Coumadin therapy who was admitted early morning of 8/16. This morning, even before this M.D. had a chance to evaluate the patient, I was informed by the RN that patient was very upset about not being able to keep her home medications at bedside and was demanding to leave the hospital. This M.D., briefly saw the patient at bedside, patient was insisting on keeping her medications at bedside. I tried to explain to her that if she came to the hospital with chest pain, and given her prior history of coronary artery disease and pulmonary embolism she needed further workup. A CT angiogram of the chest was pending. I did the utmost best to convince her to stay in the hospital, however patient was adamant on leaving [place] as she was very upset. Please see prior progress notes.   TODAY-DAY OF DISCHARGE:  Subjective:   [name] left the hospital AGAINST MEDICAL ADVICE  Objective:   Blood pressure 158/86, pulse 89, temperature 98 F (36.7 C), temperature source Oral, resp. rate 23, SpO2 94.00%. No intake or output data in the 24 hours ending 08/24/13 1813 There were no vitals filed for this visit.  Exam Could not examine the patient-she left AGAINST MEDICAL ADVICE-patient was irate and upset.  DISCHARGE CONDITION: Stable  DISPOSITION: Left against medical advice    Signed: [name] 08/24/2013 6:13 PM  **Disclaimer: This note may have been dictated with voice recognition software. Similar sounding  words can inadvertently be transcribed and this note may contain transcription errors which may not have been corrected upon publication of note.**

## 2013-08-24 NOTE — Progress Notes (Signed)
Patient very upset and very irate about the fact that her home medications cannot be left in the room, nursing staff has explained to the patient that it is hospital policy that these medications will be safely locked up and given to her on discharge. Patient is very irate about this and is insisting that these medications stay in the room. She asked this MD to leave the room. This MD explained to her that she has significant cardiac/pulmonary issues and that I recommended continued hospitalization, however patient has expressed her desire to leave the hospital against medical advice. I have clearly outlined the risks of leaving AMA-including death, and significant disability. Patient is fully aware of these risks. 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 Please note patient is alert and oriented X 4.

## 2013-08-24 NOTE — Progress Notes (Addendum)
Patient upset that she cannot leave her box of home medications in her room and patient adamant on leaving AMA, however, refusing to sign AMA papers.  MD notified and aware.  Seymour, Ardeth Sportsman

## 2013-08-24 NOTE — ED Notes (Signed)
PT states nausea and dizziness after Dilaudid. Oxygen sat 88. Placed on 2L of O2 per nasal cannula.

## 2013-08-25 ENCOUNTER — Telehealth: Payer: Self-pay

## 2013-08-25 ENCOUNTER — Emergency Department (HOSPITAL_COMMUNITY): Payer: Medicare Other

## 2013-08-25 ENCOUNTER — Encounter (HOSPITAL_COMMUNITY): Payer: Self-pay | Admitting: Emergency Medicine

## 2013-08-25 ENCOUNTER — Inpatient Hospital Stay (HOSPITAL_COMMUNITY)
Admission: EM | Admit: 2013-08-25 | Discharge: 2013-08-27 | DRG: 193 | Disposition: A | Discharge status: Home / Self Care | Payer: Medicare Other | Attending: Internal Medicine | Admitting: Internal Medicine

## 2013-08-25 DIAGNOSIS — J9611 Chronic respiratory failure with hypoxia: Secondary | ICD-10-CM

## 2013-08-25 DIAGNOSIS — Z86711 Personal history of pulmonary embolism: Secondary | ICD-10-CM | POA: Diagnosis present

## 2013-08-25 DIAGNOSIS — Z8051 Family history of malignant neoplasm of kidney: Secondary | ICD-10-CM

## 2013-08-25 DIAGNOSIS — I2510 Atherosclerotic heart disease of native coronary artery without angina pectoris: Secondary | ICD-10-CM

## 2013-08-25 DIAGNOSIS — I2699 Other pulmonary embolism without acute cor pulmonale: Secondary | ICD-10-CM

## 2013-08-25 DIAGNOSIS — Z9119 Patient's noncompliance with other medical treatment and regimen: Secondary | ICD-10-CM

## 2013-08-25 DIAGNOSIS — J9690 Respiratory failure, unspecified, unspecified whether with hypoxia or hypercapnia: Secondary | ICD-10-CM | POA: Diagnosis present

## 2013-08-25 DIAGNOSIS — E785 Hyperlipidemia, unspecified: Secondary | ICD-10-CM

## 2013-08-25 DIAGNOSIS — Z9981 Dependence on supplemental oxygen: Secondary | ICD-10-CM

## 2013-08-25 DIAGNOSIS — Z5181 Encounter for therapeutic drug level monitoring: Secondary | ICD-10-CM

## 2013-08-25 DIAGNOSIS — F17200 Nicotine dependence, unspecified, uncomplicated: Secondary | ICD-10-CM | POA: Diagnosis present

## 2013-08-25 DIAGNOSIS — G8929 Other chronic pain: Secondary | ICD-10-CM

## 2013-08-25 DIAGNOSIS — R079 Chest pain, unspecified: Secondary | ICD-10-CM | POA: Diagnosis present

## 2013-08-25 DIAGNOSIS — Z91199 Patient's noncompliance with other medical treatment and regimen due to unspecified reason: Secondary | ICD-10-CM

## 2013-08-25 DIAGNOSIS — Z8249 Family history of ischemic heart disease and other diseases of the circulatory system: Secondary | ICD-10-CM

## 2013-08-25 DIAGNOSIS — Z86718 Personal history of other venous thrombosis and embolism: Secondary | ICD-10-CM

## 2013-08-25 DIAGNOSIS — D6851 Activated protein C resistance: Secondary | ICD-10-CM | POA: Diagnosis present

## 2013-08-25 DIAGNOSIS — I252 Old myocardial infarction: Secondary | ICD-10-CM

## 2013-08-25 DIAGNOSIS — R7309 Other abnormal glucose: Secondary | ICD-10-CM

## 2013-08-25 DIAGNOSIS — J449 Chronic obstructive pulmonary disease, unspecified: Secondary | ICD-10-CM

## 2013-08-25 DIAGNOSIS — Z7901 Long term (current) use of anticoagulants: Secondary | ICD-10-CM

## 2013-08-25 DIAGNOSIS — J9610 Chronic respiratory failure, unspecified whether with hypoxia or hypercapnia: Secondary | ICD-10-CM

## 2013-08-25 DIAGNOSIS — D6859 Other primary thrombophilia: Secondary | ICD-10-CM

## 2013-08-25 DIAGNOSIS — E039 Hypothyroidism, unspecified: Secondary | ICD-10-CM | POA: Diagnosis present

## 2013-08-25 DIAGNOSIS — J9620 Acute and chronic respiratory failure, unspecified whether with hypoxia or hypercapnia: Secondary | ICD-10-CM

## 2013-08-25 DIAGNOSIS — Z9861 Coronary angioplasty status: Secondary | ICD-10-CM

## 2013-08-25 DIAGNOSIS — J189 Pneumonia, unspecified organism: Secondary | ICD-10-CM | POA: Diagnosis present

## 2013-08-25 DIAGNOSIS — J439 Emphysema, unspecified: Secondary | ICD-10-CM | POA: Diagnosis present

## 2013-08-25 DIAGNOSIS — R634 Abnormal weight loss: Secondary | ICD-10-CM

## 2013-08-25 DIAGNOSIS — I679 Cerebrovascular disease, unspecified: Secondary | ICD-10-CM

## 2013-08-25 DIAGNOSIS — J42 Unspecified chronic bronchitis: Secondary | ICD-10-CM

## 2013-08-25 DIAGNOSIS — J4489 Other specified chronic obstructive pulmonary disease: Secondary | ICD-10-CM | POA: Diagnosis present

## 2013-08-25 HISTORY — DX: Chronic respiratory failure, unspecified whether with hypoxia or hypercapnia: J96.10

## 2013-08-25 LAB — BLOOD GAS, ARTERIAL
Acid-Base Excess: 4.9 mmol/L — ABNORMAL HIGH (ref 0.0–2.0)
BICARBONATE: 30.3 meq/L — AB (ref 20.0–24.0)
Drawn by: 23534
O2 Content: 2 L/min
O2 Saturation: 95.6 %
PO2 ART: 78 mmHg — AB (ref 80.0–100.0)
Patient temperature: 37
TCO2: 26.8 mmol/L (ref 0–100)
pCO2 arterial: 57.2 mmHg (ref 35.0–45.0)
pH, Arterial: 7.343 — ABNORMAL LOW (ref 7.350–7.450)

## 2013-08-25 LAB — CBC WITH DIFFERENTIAL/PLATELET
BASOS PCT: 0 % (ref 0–1)
Basophils Absolute: 0 10*3/uL (ref 0.0–0.1)
EOS ABS: 0.1 10*3/uL (ref 0.0–0.7)
EOS PCT: 1 % (ref 0–5)
HCT: 44.6 % (ref 36.0–46.0)
Hemoglobin: 14.8 g/dL (ref 12.0–15.0)
LYMPHS ABS: 1.3 10*3/uL (ref 0.7–4.0)
Lymphocytes Relative: 14 % (ref 12–46)
MCH: 31 pg (ref 26.0–34.0)
MCHC: 33.2 g/dL (ref 30.0–36.0)
MCV: 93.5 fL (ref 78.0–100.0)
Monocytes Absolute: 0.7 10*3/uL (ref 0.1–1.0)
Monocytes Relative: 7 % (ref 3–12)
NEUTROS PCT: 78 % — AB (ref 43–77)
Neutro Abs: 7.4 10*3/uL (ref 1.7–7.7)
PLATELETS: 248 10*3/uL (ref 150–400)
RBC: 4.77 MIL/uL (ref 3.87–5.11)
RDW: 15.4 % (ref 11.5–15.5)
WBC: 9.6 10*3/uL (ref 4.0–10.5)

## 2013-08-25 LAB — BASIC METABOLIC PANEL
ANION GAP: 11 (ref 5–15)
BUN: 19 mg/dL (ref 6–23)
CALCIUM: 8.8 mg/dL (ref 8.4–10.5)
CO2: 30 mEq/L (ref 19–32)
Chloride: 101 mEq/L (ref 96–112)
Creatinine, Ser: 1.03 mg/dL (ref 0.50–1.10)
GFR calc Af Amer: 66 mL/min — ABNORMAL LOW (ref >90)
GFR, EST NON AFRICAN AMERICAN: 57 mL/min — AB (ref >90)
Glucose, Bld: 113 mg/dL — ABNORMAL HIGH (ref 70–99)
Potassium: 3.9 mEq/L (ref 3.7–5.3)
SODIUM: 142 meq/L (ref 137–147)

## 2013-08-25 LAB — TROPONIN I: Troponin I: <0.3 ng/mL (ref <0.30)

## 2013-08-25 LAB — PROTIME-INR
INR: 1.95 — ABNORMAL HIGH (ref 0.00–1.49)
Prothrombin Time: 22.2 seconds — ABNORMAL HIGH (ref 11.6–15.2)

## 2013-08-25 MED ORDER — TICAGRELOR 90 MG PO TABS
90.0000 mg | ORAL_TABLET | Freq: Two times a day (BID) | ORAL | Status: DC
  Administered 2013-08-25 – 2013-08-27 (×4): 90 mg via ORAL
  Filled 2013-08-25 (×8): qty 1

## 2013-08-25 MED ORDER — WARFARIN SODIUM 5 MG PO TABS
5.0000 mg | ORAL_TABLET | ORAL | Status: DC
  Filled 2013-08-25: qty 1

## 2013-08-25 MED ORDER — FOLIC ACID 1 MG PO TABS
1.0000 mg | ORAL_TABLET | Freq: Every day | ORAL | Status: DC
  Administered 2013-08-25 – 2013-08-27 (×3): 1 mg via ORAL
  Filled 2013-08-25 (×3): qty 1

## 2013-08-25 MED ORDER — ASPIRIN EC 81 MG PO TBEC
81.0000 mg | DELAYED_RELEASE_TABLET | Freq: Every day | ORAL | Status: DC
  Administered 2013-08-25 – 2013-08-27 (×3): 81 mg via ORAL
  Filled 2013-08-25 (×3): qty 1

## 2013-08-25 MED ORDER — VITAMIN B-1 100 MG PO TABS
100.0000 mg | ORAL_TABLET | Freq: Every day | ORAL | Status: DC
  Administered 2013-08-25 – 2013-08-27 (×3): 100 mg via ORAL
  Filled 2013-08-25 (×3): qty 1

## 2013-08-25 MED ORDER — DEXTROSE 5 % IV SOLN
1.0000 g | INTRAVENOUS | Status: DC
  Administered 2013-08-25 – 2013-08-26 (×2): 1 g via INTRAVENOUS
  Filled 2013-08-25 (×3): qty 10

## 2013-08-25 MED ORDER — RANOLAZINE ER 500 MG PO TB12
ORAL_TABLET | ORAL | Status: AC
  Filled 2013-08-25: qty 1

## 2013-08-25 MED ORDER — ONDANSETRON HCL 4 MG PO TABS: 4.0000 mg | ORAL_TABLET | Freq: Four times a day (QID) | ORAL | Status: DC | PRN

## 2013-08-25 MED ORDER — TIOTROPIUM BROMIDE MONOHYDRATE 18 MCG IN CAPS
18.0000 ug | ORAL_CAPSULE | Freq: Every day | RESPIRATORY_TRACT | Status: DC
  Administered 2013-08-26 – 2013-08-27 (×2): 18 ug via RESPIRATORY_TRACT
  Filled 2013-08-25: qty 5

## 2013-08-25 MED ORDER — SODIUM CHLORIDE 0.9 % IJ SOLN
3.0000 mL | Freq: Two times a day (BID) | INTRAMUSCULAR | Status: DC
  Administered 2013-08-26 – 2013-08-27 (×3): 3 mL via INTRAVENOUS

## 2013-08-25 MED ORDER — HEPARIN SODIUM (PORCINE) 5000 UNIT/ML IJ SOLN
5000.0000 [IU] | Freq: Three times a day (TID) | INTRAMUSCULAR | Status: DC
  Administered 2013-08-25 – 2013-08-26 (×3): 5000 [IU] via SUBCUTANEOUS
  Filled 2013-08-25 (×3): qty 1

## 2013-08-25 MED ORDER — NITROGLYCERIN 0.4 MG SL SUBL: 0.4000 mg | SUBLINGUAL_TABLET | SUBLINGUAL | Status: DC | PRN

## 2013-08-25 MED ORDER — RANOLAZINE ER 500 MG PO TB12
500.0000 mg | ORAL_TABLET | Freq: Two times a day (BID) | ORAL | Status: DC
  Administered 2013-08-25 – 2013-08-27 (×4): 500 mg via ORAL
  Filled 2013-08-25 (×8): qty 1

## 2013-08-25 MED ORDER — ALUM & MAG HYDROXIDE-SIMETH 200-200-20 MG/5ML PO SUSP: 30.0000 mL | Freq: Four times a day (QID) | ORAL | Status: DC | PRN

## 2013-08-25 MED ORDER — WARFARIN SODIUM 7.5 MG PO TABS
7.5000 mg | ORAL_TABLET | ORAL | Status: DC
  Administered 2013-08-25: 7.5 mg via ORAL
  Filled 2013-08-25: qty 1

## 2013-08-25 MED ORDER — LEVOTHYROXINE SODIUM 100 MCG PO TABS
125.0000 ug | ORAL_TABLET | Freq: Every day | ORAL | Status: DC
  Administered 2013-08-26 – 2013-08-27 (×2): 125 ug via ORAL
  Filled 2013-08-25 (×4): qty 1

## 2013-08-25 MED ORDER — ONDANSETRON HCL 4 MG/2ML IJ SOLN
4.0000 mg | Freq: Once | INTRAMUSCULAR | Status: AC
  Administered 2013-08-25: 4 mg via INTRAVENOUS
  Filled 2013-08-25: qty 2

## 2013-08-25 MED ORDER — CEFTRIAXONE SODIUM 1 G IJ SOLR
INTRAMUSCULAR | Status: AC
  Filled 2013-08-25: qty 10

## 2013-08-25 MED ORDER — ACETAMINOPHEN 325 MG PO TABS: 650.0000 mg | ORAL_TABLET | Freq: Four times a day (QID) | ORAL | Status: DC | PRN

## 2013-08-25 MED ORDER — METHYLPREDNISOLONE SODIUM SUCC 125 MG IJ SOLR
125.0000 mg | Freq: Once | INTRAMUSCULAR | Status: AC
  Administered 2013-08-25: 125 mg via INTRAVENOUS
  Filled 2013-08-25: qty 2

## 2013-08-25 MED ORDER — IPRATROPIUM-ALBUTEROL 0.5-2.5 (3) MG/3ML IN SOLN
3.0000 mL | Freq: Once | RESPIRATORY_TRACT | Status: AC
  Administered 2013-08-25: 3 mL via RESPIRATORY_TRACT
  Filled 2013-08-25: qty 3

## 2013-08-25 MED ORDER — ATORVASTATIN CALCIUM 40 MG PO TABS
80.0000 mg | ORAL_TABLET | Freq: Every day | ORAL | Status: DC
  Administered 2013-08-25 – 2013-08-26 (×2): 80 mg via ORAL
  Filled 2013-08-25 (×2): qty 2

## 2013-08-25 MED ORDER — IOHEXOL 350 MG/ML SOLN
100.0000 mL | Freq: Once | INTRAVENOUS | Status: AC | PRN
  Administered 2013-08-25: 100 mL via INTRAVENOUS

## 2013-08-25 MED ORDER — MORPHINE SULFATE 4 MG/ML IJ SOLN
4.0000 mg | Freq: Once | INTRAMUSCULAR | Status: AC
  Administered 2013-08-25: 4 mg via INTRAVENOUS
  Filled 2013-08-25: qty 1

## 2013-08-25 MED ORDER — TICAGRELOR 90 MG PO TABS
ORAL_TABLET | ORAL | Status: AC
  Filled 2013-08-25: qty 1

## 2013-08-25 MED ORDER — ALBUTEROL (5 MG/ML) CONTINUOUS INHALATION SOLN
10.0000 mg/h | INHALATION_SOLUTION | Freq: Once | RESPIRATORY_TRACT | Status: AC
  Administered 2013-08-25: 10 mg/h via RESPIRATORY_TRACT
  Filled 2013-08-25: qty 20

## 2013-08-25 MED ORDER — ONDANSETRON HCL 4 MG/2ML IJ SOLN: 4.0000 mg | Freq: Four times a day (QID) | INTRAMUSCULAR | Status: DC | PRN

## 2013-08-25 MED ORDER — MOMETASONE FURO-FORMOTEROL FUM 100-5 MCG/ACT IN AERO
INHALATION_SPRAY | RESPIRATORY_TRACT | Status: AC
  Filled 2013-08-25: qty 8.8

## 2013-08-25 MED ORDER — ACETAMINOPHEN 650 MG RE SUPP: 650.0000 mg | Freq: Four times a day (QID) | RECTAL | Status: DC | PRN

## 2013-08-25 MED ORDER — WARFARIN - PHYSICIAN DOSING INPATIENT: Freq: Every day | Status: DC

## 2013-08-25 MED ORDER — ASPIRIN 81 MG PO CHEW
324.0000 mg | CHEWABLE_TABLET | Freq: Once | ORAL | Status: AC
  Administered 2013-08-25: 324 mg via ORAL
  Filled 2013-08-25: qty 4

## 2013-08-25 MED ORDER — MOMETASONE FURO-FORMOTEROL FUM 100-5 MCG/ACT IN AERO
2.0000 | INHALATION_SPRAY | Freq: Two times a day (BID) | RESPIRATORY_TRACT | Status: DC
  Administered 2013-08-26 – 2013-08-27 (×3): 2 via RESPIRATORY_TRACT
  Filled 2013-08-25: qty 8.8

## 2013-08-25 MED ORDER — FENTANYL 25 MCG/HR TD PT72
25.0000 ug | MEDICATED_PATCH | TRANSDERMAL | Status: DC
  Administered 2013-08-26: 25 ug via TRANSDERMAL
  Filled 2013-08-25: qty 1

## 2013-08-25 MED ORDER — ADULT MULTIVITAMIN W/MINERALS CH
1.0000 | ORAL_TABLET | Freq: Every day | ORAL | Status: DC
  Administered 2013-08-25 – 2013-08-27 (×3): 1 via ORAL
  Filled 2013-08-25 (×3): qty 1

## 2013-08-25 MED ORDER — SODIUM CHLORIDE 0.9 % IV SOLN
INTRAVENOUS | Status: DC
  Administered 2013-08-25: 21:00:00 via INTRAVENOUS

## 2013-08-25 MED ORDER — ALBUTEROL SULFATE (2.5 MG/3ML) 0.083% IN NEBU: 2.5000 mg | INHALATION_SOLUTION | RESPIRATORY_TRACT | Status: DC | PRN

## 2013-08-25 NOTE — Telephone Encounter (Signed)
Pt left AMA over the weekend at [facility] and now continues to have CP.I encouraged her to go back to [facility] for evaluation and treatment.She agreed with plan

## 2013-08-25 NOTE — ED Notes (Signed)
Chest pain and sob since Saturday ,  Was seen at  Saturday with same.

## 2013-08-25 NOTE — ED Notes (Signed)
Patient seen and assessed by ED MD

## 2013-08-25 NOTE — H&P (Signed)
[facility] History and Physical  [name] ENI:[id number] DOB: 1950/08/31 DOA: 08/25/2013  Referring physician: Pryor Curia, DO PCP: Drema Pry, DO   Chief Complaint: Chest Pain  HPI: [name] is a 63 y.o. female with chest pain. She states that she left AMA from [facility] as she disagreed with the physicians over there. Patient states that she now comes here with chest pain similar to her pain before. She also has noted that she has been having increased shortness of breath. Patient has had a cough also. She states that she is bringing up some sputum. She states that she has greenish color to the sputum. She has no hemoptysis noted. The pain she states is substernal and does not appear to radiate. In the ED she had a CT scan done as she was sub therapeutic on her coumadin. There was no evidence of a pulmonary embolism but she did have some infiltrate noted right lung and some atelectatic changes noted in the left side.   Review of Systems:  Constitutional:  No weight loss, night sweats HEENT:  No headaches, Difficulty swallowing,Tooth/dental problems,Sore throat,  No sneezing  Cardio-vascular:  ++chest pain, no Orthopnea, PND, palpitations  GI:  No heartburn, indigestion, abdominal pain, nausea, vomiting, diarrhea  Resp:  ++shortness of breath at rest. No excess mucus, ++productive cough, No coughing up of blood.  Skin:  no rash or lesions.  GU:  no dysuria, change in color of urine, no urgency or frequency.  Musculoskeletal:  No joint pain or swelling. No decreased range of motion.  Psych:  No change in mood or affect. No depression or anxiety.   Past Medical History  Diagnosis Date  . Arteriosclerotic cardiovascular disease (ASCVD) 2002    Inf STEMI-2002. 2003-cutting balloon + brachytherapy for restenosis; subsequent acute stent thrombosis 06/2010 requiring 2 separate interventions (Zeta stent, then repeat cath with thrombectomy). focal basal inf AK, nl EF;  03/2011: Patent stents, minor nonobst  residual dz, nl EF; neg stress nuclear in 2008 and stress echo in 2009  . Pulmonary embolism 2006    Associated with deep vein thrombosis-2006; + factor V Leiden  . Factor V Leiden, prothrombin gene mutation 2006  . Hyperlipidemia   . COPD (chronic obstructive pulmonary disease)   . Tobacco abuse     50 pack years  . Hypothyroidism   . Noncompliance   . Pelvic fracture 2009  . Chronic anticoagulation     Warfarin plus ticagrelor  . Acute MI     x4, code blue x3   Past Surgical History  Procedure Laterality Date  . Coronary angioplasty  2002, 2003, 2012  . Colonoscopy  Approximately 2000    Negative screening study   Social History:  reports that she has quit smoking. Her smoking use included Cigarettes. She has a 50 pack-year smoking history. She has never used smokeless tobacco. She reports that she does not drink alcohol or use illicit drugs.  Allergies  Allergen Reactions  . Dilaudid [Hydromorphone Hcl] Hives and Nausea Only  . Minocycline Hcl     REACTION: Dizzy  . Prednisone     REACTION: feels like throat swelling  . Varenicline Tartrate     REACTION: Dizzy(chantix)   . Zocor [Simvastatin - High Dose] Other (See Comments)    myalgia    Family History  Problem Relation Age of Onset  . Emphysema Mother   . Emphysema Sister   . Heart disease Father   . Factor V Leiden deficiency Father   .  Factor V Leiden deficiency Sister   . Factor V Leiden deficiency Daughter   . Kidney cancer Paternal Grandmother      Prior to Admission medications   Medication Sig Start Date End Date Taking? Authorizing Provider  albuterol (PROVENTIL HFA;VENTOLIN HFA) 108 (90 BASE) MCG/ACT inhaler Inhale 2 puffs into the lungs every 4 (four) hours as needed for wheezing or shortness of breath.   Yes Historical Provider, MD  fentaNYL (DURAGESIC - DOSED MCG/HR) 25 MCG/HR patch Place 25 mcg onto the skin every 3 (three) days.   Yes Historical Provider, MD    fish oil-omega-3 fatty acids 1000 MG capsule Take 2 g by mouth daily.     Yes Historical Provider, MD  Fluticasone-Salmeterol (ADVAIR) 250-50 MCG/DOSE AEPB Inhale 1 puff into the lungs 2 (two) times daily.   Yes Historical Provider, MD  levothyroxine (SYNTHROID, LEVOTHROID) 125 MCG tablet Take 125 mcg by mouth daily before breakfast.   Yes Historical Provider, MD  nitroGLYCERIN (NITROSTAT) 0.4 MG SL tablet Place 0.4 mg under the tongue every 5 (five) minutes as needed for chest pain.   Yes Historical Provider, MD  ranolazine (RANEXA) 500 MG 12 hr tablet Take 500 mg by mouth 2 (two) times daily.   Yes Historical Provider, MD  rosuvastatin (CRESTOR) 40 MG tablet Take 40 mg by mouth daily.   Yes Historical Provider, MD  ticagrelor (BRILINTA) 90 MG TABS tablet Take 90 mg by mouth 2 (two) times daily.   Yes Historical Provider, MD  tiotropium (SPIRIVA) 18 MCG inhalation capsule Place 18 mcg into inhaler and inhale daily.   Yes Historical Provider, MD  warfarin (COUMADIN) 5 MG tablet Take 5-7.5 mg by mouth See admin instructions. Takes one and one-half tablet (7.5mg  total) on all days except Tuesdays. Takes one tablet (5mg  total) on Tuesdays only   Yes Historical Provider, MD   Physical Exam: Filed Vitals:   08/25/13 1537 08/25/13 1630 08/25/13 1723 08/25/13 1808  BP: 113/87 118/55    Pulse: 104 90    Temp: 98.8 F (37.1 C)     TempSrc: Oral     Resp: 27 27    Height: 5\' 6"  (1.676 m)     Weight: 87.998 kg (194 lb)     SpO2: 90% 87% 85% 96%    Wt Readings from Last 3 Encounters:  08/25/13 87.998 kg (194 lb)  05/01/13 87.998 kg (194 lb)  04/28/13 89.54 kg (197 lb 6.4 oz)    General:  Appears calm and comfortable Eyes: PERRL, normal lids, irises & conjunctiva ENT: grossly normal hearing, lips & tongue Neck: no LAD, masses or thyromegaly Cardiovascular: RRR, no m/r/g. No LE edema. Respiratory:Normal respiratory effort.++wheeze bilaterally Abdomen: soft, no tenderness Skin: no rash or  induration seen on limited exam Musculoskeletal: grossly normal tone BUE/BLE Psychiatric: grossly normal mood and affect, speech fluent and appropriate Neurologic: grossly non-focal.          Labs on Admission:  Basic Metabolic Panel:  Recent Labs Lab 08/24/13 0007 08/25/13 1640  NA 140 142  K 4.1 3.9  CL 102 101  CO2 27 30  GLUCOSE 120* 113*  BUN 19 19  CREATININE 1.00 1.03  CALCIUM 9.1 8.8   Liver Function Tests: No results found for this basename: AST, ALT, ALKPHOS, BILITOT, PROT, ALBUMIN,  in the last 168 hours No results found for this basename: LIPASE, AMYLASE,  in the last 168 hours No results found for this basename: AMMONIA,  in the last 168 hours CBC:  Recent Labs Lab 08/24/13 0007 08/25/13 1640  WBC 6.7 9.6  NEUTROABS  --  7.4  HGB 15.8* 14.8  HCT 46.3* 44.6  MCV 93.3 93.5  PLT 248 248   Cardiac Enzymes:  Recent Labs Lab 08/24/13 0330 08/25/13 1640  TROPONINI <0.30 <0.30    BNP (last 3 results) No results found for this basename: PROBNP,  in the last 8760 hours CBG: No results found for this basename: GLUCAP,  in the last 168 hours  Radiological Exams on Admission: Ct Angio Chest Pe W/cm &/or Wo Cm  08/25/2013   CLINICAL DATA:  Chest pain, shortness of breath, history MI, COPD, smoking, factor 5 Leiden deficiency, prior pulmonary embolism.  EXAM: CT ANGIOGRAPHY CHEST WITH CONTRAST  TECHNIQUE: Multidetector CT imaging of the chest was performed using the standard protocol during bolus administration of intravenous contrast. Multiplanar CT image reconstructions and MIPs were obtained to evaluate the vascular anatomy.  CONTRAST:  166mL OMNIPAQUE IOHEXOL 350 MG/ML SOLN  COMPARISON:  03/27/2012  FINDINGS: Scattered atherosclerotic calcifications aorta and coronary arteries.  No aortic aneurysm or dissection.  Small pericardial effusion.  Visualized upper abdomen unremarkable.  No thoracic adenopathy.  Pulmonary arteries patent.  No evidence of pulmonary  embolism.  Emphysematous and bronchitic changes.  Chronic atelectasis versus scarring in LEFT lower lobe.  Minimal patchy segmental infiltrate in posterior RIGHT lower lobe.  Remaining lungs clear without pleural effusion or pneumothorax.  No acute osseous findings.  Review of the MIP images confirms the above findings.  IMPRESSION: No evidence of pulmonary embolism.  Small pericardial effusion increased since previous exam.  Emphysematous and bronchitic changes consistent with COPD.  New mild segmental infiltrate in the RIGHT lower lobe with chronic atelectasis versus scarring in LEFT lower lobe.   Electronically Signed   By: Lavonia Dana M.D.   On: 08/25/2013 17:24   Dg Chest Port 1 View  08/24/2013   CLINICAL DATA:  Chest pain.  EXAM: PORTABLE CHEST - 1 VIEW  COMPARISON:  Chest x-ray 06/13/2011.  FINDINGS: There is hyperinflation of the lungs compatible with COPD. Heart is normal size. Left base atelectasis or scarring. Right lung is clear. No effusions. No acute bony abnormality.  IMPRESSION: COPD.  Left basilar atelectasis or scarring.   Electronically Signed   By: Rolm Baptise M.D.   On: 08/24/2013 00:25     Assessment/Plan Principal Problem:   Chest pain Active Problems:   HYPOTHYROIDISM   TOBACCO ABUSE   PULMONARY EMBOLISM   Chronic obstructive pulmonary disease   1. Chest pain -Atypical pain will admit for observation -will get serial enzymes -may need stress testing   2. COPD -still smoking -will continue with her inhalers -currently does not need IV steroids  3. Tobacco use -counseled on smoking cessation  4. Pulmonary Embolism -no new clots noted -she does have significant infiltrate noted though unclear if this is infectious or an infarct -will continue with her anticoagulation  5. Hypothyroidism -will continue with thyroid replacement -will check TSH  6. Pulmonary infiltrate -may represent an acute infection -will start on rocephin -will repeat CXR   Code  Status: Full Code (must indicate code status--if unknown or must be presumed, indicate so) DVT Prophylaxis:heparin Family Communication: None (indicate person spoken with, if applicable, with phone number if by telephone) Disposition Plan: Home (indicate anticipated LOS)  Time spent: 31min  KHAN,SAADAT A [facility] Pager [phone]  **Disclaimer: This note may have been dictated with voice recognition software. Similar sounding words can inadvertently be  transcribed and this note may contain transcription errors which may not have been corrected upon publication of note.**

## 2013-08-25 NOTE — ED Notes (Signed)
Dr. Regenia Skeeter notified of critical C02 of 36.2

## 2013-08-25 NOTE — ED Notes (Signed)
Report given to Brandi RN on 300 

## 2013-08-25 NOTE — ED Notes (Signed)
Critical PCO2 Called to this RN. Benjamine Mola, RN notified who will notify appropriate room.

## 2013-08-25 NOTE — ED Provider Notes (Signed)
TIME SEEN: 3:44 PM  CHIEF COMPLAINT: Chest pain, shortness of breath  HPI: Patient is a 63 year old female with history of multiple prior myocardial infarctions requiring 4 stents, pulmonary embolus secondary to factor V Leiden deficiency on chronic anticoagulation, hyperlipidemia, continued tobacco use, COPD, hypothyroidism who presents to the emergency department with complaints of chest pain shortness of breath. Symptoms started on Saturday. She describes her pain initially as a constant, "wrenching" pain. She states now is a left-sided stabbing pain that is intermittent and worse with deep inspiration. She states this feels similar to her prior pulmonary emboli. She states with her prior heart attacks she did not have chest pain, only had syncopal events. No syncopal events recently. States she believes her last stress test was in 2009. She was recently admitted to the hospital at [facility] on 08/24/13 and left [facility] prior to a full workup. She called her cardiologist with [facility] today who recommended she come back to the hospital for admission. Her last INR was subtherapeutic at 1.8. She has had some intermittent wheezing. No fevers or cough. No lower extremity swelling or pain.  PCP is Dr. Shawna Orleans  ROS: See HPI Constitutional: no fever  Eyes: no drainage  ENT: no runny nose   Cardiovascular:  no chest pain  Resp: no SOB  GI: no vomiting GU: no dysuria Integumentary: no rash  Allergy: no hives  Musculoskeletal: no leg swelling  Neurological: no slurred speech ROS otherwise negative  PAST MEDICAL HISTORY/PAST SURGICAL HISTORY:  Past Medical History  Diagnosis Date  . Arteriosclerotic cardiovascular disease (ASCVD) 2002    Inf STEMI-2002. 2003-cutting balloon + brachytherapy for restenosis; subsequent acute stent thrombosis 06/2010 requiring 2 separate interventions (Zeta stent, then repeat cath with thrombectomy). focal basal inf AK, nl EF; 03/2011: Patent stents, minor  nonobst  residual dz, nl EF; neg stress nuclear in 2008 and stress echo in 2009  . Pulmonary embolism 2006    Associated with deep vein thrombosis-2006; + factor V Leiden  . Factor V Leiden, prothrombin gene mutation 2006  . Hyperlipidemia   . COPD (chronic obstructive pulmonary disease)   . Tobacco abuse     50 pack years  . Hypothyroidism   . Noncompliance   . Pelvic fracture 2009  . Chronic anticoagulation     Warfarin plus ticagrelor  . Acute MI     x4, code blue x3    MEDICATIONS:  Prior to Admission medications   Medication Sig Start Date End Date Taking? Authorizing Provider  albuterol (PROVENTIL HFA;VENTOLIN HFA) 108 (90 BASE) MCG/ACT inhaler Inhale 2 puffs into the lungs every 4 (four) hours as needed for wheezing or shortness of breath.    Historical Provider, MD  fish oil-omega-3 fatty acids 1000 MG capsule Take 2 g by mouth daily.      Historical Provider, MD  levothyroxine (SYNTHROID, LEVOTHROID) 125 MCG tablet Take 125 mcg by mouth daily before breakfast.    Historical Provider, MD  nitroGLYCERIN (NITROSTAT) 0.4 MG SL tablet Place 0.4 mg under the tongue every 5 (five) minutes as needed for chest pain.    Historical Provider, MD  ranolazine (RANEXA) 500 MG 12 hr tablet Take 500 mg by mouth 2 (two) times daily.    Historical Provider, MD  rosuvastatin (CRESTOR) 40 MG tablet Take 40 mg by mouth daily.    Historical Provider, MD  ticagrelor (BRILINTA) 90 MG TABS tablet Take 90 mg by mouth 2 (two) times daily.    Historical Provider,  MD  tiotropium (SPIRIVA) 18 MCG inhalation capsule Place 18 mcg into inhaler and inhale daily.    Historical Provider, MD  warfarin (COUMADIN) 5 MG tablet Take 5-7.5 mg by mouth daily. Take 7.5mg  on Sun, Wed, & Friday.  Take 5mg  on Mon, Tues, Thurs, Sat    Historical Provider, MD    ALLERGIES:  Allergies  Allergen Reactions  . Dilaudid [Hydromorphone Hcl] Hives and Nausea Only  . Minocycline Hcl     REACTION: Dizzy  . Prednisone      REACTION: feels like throat swelling  . Varenicline Tartrate     REACTION: Dizzy(chantix)   . Zocor [Simvastatin - High Dose] Other (See Comments)    myalgia    SOCIAL HISTORY:  History  Substance Use Topics  . Smoking status: Former Smoker -- 1.00 packs/day for 50 years    Types: Cigarettes  . Smokeless tobacco: Never Used     Comment: has not had a cig in 4 months as of 02-27-13  . Alcohol Use: No    FAMILY HISTORY: Family History  Problem Relation Age of Onset  . Emphysema Mother   . Emphysema Sister   . Heart disease Father   . Factor V Leiden deficiency Father   . Factor V Leiden deficiency Sister   . Factor V Leiden deficiency Daughter   . Kidney cancer Paternal Grandmother     EXAM: BP 113/87  Pulse 104  Temp(Src) 98.8 F (37.1 C) (Oral)  Resp 27  Ht 5\' 6"  (1.676 m)  Wt 194 lb (87.998 kg)  BMI 31.33 kg/m2  SpO2 90% CONSTITUTIONAL: Alert and oriented and responds appropriately to questions. Well-appearing; well-nourished HEAD: Normocephalic EYES: Conjunctivae clear, PERRL ENT: normal nose; no rhinorrhea; moist mucous membranes; pharynx without lesions noted NECK: Supple, no meningismus, no LAD  CARD: RRR; S1 and S2 appreciated; no murmurs, no clicks, no rubs, no gallops RESP: Normal chest excursion without splinting or tachypnea; breath sounds equal bilaterally, patient does have some mild expiratory wheezing diffusely, slightly diminished breath sounds at her bases bilaterally, no rhonchi, no rales, no hypoxia, speaking in short sentences, ABD/GI: Normal bowel sounds; non-distended; soft, non-tender, no rebound, no guarding BACK:  The back appears normal and is non-tender to palpation, there is no CVA tenderness EXT: Normal ROM in all joints; non-tender to palpation; no edema; normal capillary refill; no cyanosis    SKIN: Normal color for age and race; warm NEURO: Moves all extremities equally PSYCH: The patient's mood and manner are appropriate. Grooming and  personal hygiene are appropriate.  MEDICAL DECISION MAKING: Patient here chest pain shortness of breath. She has a history of significant ACS, pulmonary emboli and COPD. We'll obtain cardiac labs, give aspirin. EKG shows no significant change from 2013. She does have some wheezing and is slightly diminished at her bases when auscultated, will give duo neb. We'll give sodium Medrol. We'll also obtain a CT of her chest rule out pulmonary embolus given her INR is subtherapeutic and this was recommended during her last admission. Patient states she now agrees to stay in the hospital for further treatment.  ED PROGRESS: Patient's labs are unremarkable. Troponin negative. INR is 1.95. CT of patient's chest shows no new pulmonary embolus. She is a small pericardial effusion that has increased since the prior exam. She does have a minimal patchy segmental infiltrate in the posterior right lower lobe. She denies any fevers. She states she has chronic chills and cough which is unchanged. No leukocytosis today. We'll hold off  on antibiotics at this time. Patient is now slightly hypoxic. She does wear oxygen at night. We'll give  continuous treatment and obtain ABG. We'll discuss with hospitalist for admission for possible COPD exacerbation and ACS rule out.[name]   6:24 PM  Spoke with Dr. Chancy Milroy for admission.    Date: 08/25/2013 15:37  Rate: 106  Rhythm: Sinus tachycardia  QRS Axis: normal  Intervals: normal  ST/T Wave abnormalities: T wave inversions in inferior leads  Conduction Disutrbances: none  Narrative Interpretation: Sinus tachycardia, patient has Q waves in anterior leads and T-wave inversions in inferior leads, no significant change when compared to EKG in 2013        Kristen N Ward, DO 08/25/13 1824

## 2013-08-26 ENCOUNTER — Observation Stay (HOSPITAL_COMMUNITY): Payer: Medicare Other

## 2013-08-26 DIAGNOSIS — Z7901 Long term (current) use of anticoagulants: Secondary | ICD-10-CM

## 2013-08-26 DIAGNOSIS — J189 Pneumonia, unspecified organism: Secondary | ICD-10-CM | POA: Diagnosis not present

## 2013-08-26 LAB — CBC
HEMATOCRIT: 43.3 % (ref 36.0–46.0)
Hemoglobin: 14.3 g/dL (ref 12.0–15.0)
MCH: 30.8 pg (ref 26.0–34.0)
MCHC: 33 g/dL (ref 30.0–36.0)
MCV: 93.3 fL (ref 78.0–100.0)
Platelets: 218 10*3/uL (ref 150–400)
RBC: 4.64 MIL/uL (ref 3.87–5.11)
RDW: 15.4 % (ref 11.5–15.5)
WBC: 10.4 10*3/uL (ref 4.0–10.5)

## 2013-08-26 LAB — COMPREHENSIVE METABOLIC PANEL
ALT: 13 U/L (ref 0–35)
ANION GAP: 11 (ref 5–15)
AST: 20 U/L (ref 0–37)
Albumin: 3.3 g/dL — ABNORMAL LOW (ref 3.5–5.2)
Alkaline Phosphatase: 97 U/L (ref 39–117)
BILIRUBIN TOTAL: 0.2 mg/dL — AB (ref 0.3–1.2)
BUN: 20 mg/dL (ref 6–23)
CHLORIDE: 101 meq/L (ref 96–112)
CO2: 29 mEq/L (ref 19–32)
CREATININE: 1.02 mg/dL (ref 0.50–1.10)
Calcium: 9 mg/dL (ref 8.4–10.5)
GFR calc Af Amer: 66 mL/min — ABNORMAL LOW (ref >90)
GFR calc non Af Amer: 57 mL/min — ABNORMAL LOW (ref >90)
Glucose, Bld: 155 mg/dL — ABNORMAL HIGH (ref 70–99)
Potassium: 4.4 mEq/L (ref 3.7–5.3)
Sodium: 141 mEq/L (ref 137–147)
Total Protein: 7.1 g/dL (ref 6.0–8.3)

## 2013-08-26 LAB — GLUCOSE, CAPILLARY: GLUCOSE-CAPILLARY: 136 mg/dL — AB (ref 70–99)

## 2013-08-26 LAB — HEMOGLOBIN A1C
Hgb A1c MFr Bld: 6 % — ABNORMAL HIGH (ref <5.7)
Mean Plasma Glucose: 126 mg/dL — ABNORMAL HIGH (ref <117)

## 2013-08-26 LAB — PROTIME-INR
INR: 2 — AB (ref 0.00–1.49)
PROTHROMBIN TIME: 22.7 s — AB (ref 11.6–15.2)

## 2013-08-26 LAB — TROPONIN I: Troponin I: <0.3 ng/mL (ref <0.30)

## 2013-08-26 LAB — TSH: TSH: 91.17 u[IU]/mL — ABNORMAL HIGH (ref 0.350–4.500)

## 2013-08-26 MED ORDER — WARFARIN SODIUM 7.5 MG PO TABS
7.5000 mg | ORAL_TABLET | ORAL | Status: AC
  Administered 2013-08-26: 7.5 mg via ORAL
  Filled 2013-08-26: qty 1

## 2013-08-26 MED ORDER — DEXTROSE 5 % IV SOLN
500.0000 mg | INTRAVENOUS | Status: DC
  Administered 2013-08-26: 500 mg via INTRAVENOUS
  Filled 2013-08-26 (×2): qty 500

## 2013-08-26 MED ORDER — IPRATROPIUM-ALBUTEROL 0.5-2.5 (3) MG/3ML IN SOLN
3.0000 mL | RESPIRATORY_TRACT | Status: DC
  Administered 2013-08-26 – 2013-08-27 (×5): 3 mL via RESPIRATORY_TRACT
  Filled 2013-08-26 (×5): qty 3

## 2013-08-26 MED ORDER — WARFARIN - PHARMACIST DOSING INPATIENT: Status: DC

## 2013-08-26 NOTE — Progress Notes (Addendum)
ANTICOAGULATION CONSULT NOTE - Initial Consult  Pharmacy Consult for Coumadin Indication: Hx PE, Factor V Leiden  Allergies  Allergen Reactions  . Dilaudid [Hydromorphone Hcl] Hives and Nausea Only  . Minocycline Hcl     REACTION: Dizzy  . Prednisone     REACTION: feels like throat swelling  . Varenicline Tartrate     REACTION: Dizzy(chantix)   . Zocor [Simvastatin - High Dose] Other (See Comments)    myalgia    Patient Measurements: Height: 5\' 6"  (167.6 cm) Weight: 200 lb (90.719 kg) IBW/kg (Calculated) : 59.3  Vital Signs: Temp: 98 F (36.7 C) (08/18 0548) Temp src: Oral (08/18 0548) BP: 101/59 mmHg (08/18 1456) Pulse Rate: 69 (08/18 1456)  Labs:  Recent Labs  08/24/13 0007  08/25/13 1640 08/25/13 2042 08/26/13 0211 08/26/13 0829  HGB 15.8*  --  14.8  --  14.3  --   HCT 46.3*  --  44.6  --  43.3  --   PLT 248  --  248  --  218  --   LABPROT 21.5*  --  22.2*  --  22.7*  --   INR 1.87*  --  1.95*  --  2.00*  --   CREATININE 1.00  --  1.03  --  1.02  --   TROPONINI  --   < > <0.30 <0.30 <0.30 <0.30  < > = values in this interval not displayed.  Estimated Creatinine Clearance: 64.1 ml/min (by C-G formula based on Cr of 1.02).   Medical History: Past Medical History  Diagnosis Date  . Arteriosclerotic cardiovascular disease (ASCVD) 2002    Inf STEMI-2002. 2003-cutting balloon + brachytherapy for restenosis; subsequent acute stent thrombosis 06/2010 requiring 2 separate interventions (Zeta stent, then repeat cath with thrombectomy). focal basal inf AK, nl EF; 03/2011: Patent stents, minor nonobst  residual dz, nl EF; neg stress nuclear in 2008 and stress echo in 2009  . Pulmonary embolism 2006    Associated with deep vein thrombosis-2006; + factor V Leiden  . Factor V Leiden, prothrombin gene mutation 2006  . Hyperlipidemia   . COPD (chronic obstructive pulmonary disease)   . Tobacco abuse     50 pack years  . Hypothyroidism   . Noncompliance   . Pelvic  fracture 2009  . Chronic anticoagulation     Warfarin plus ticagrelor  . Acute MI     x4, code blue x3    Medications:  Prescriptions prior to admission  Medication Sig Dispense Refill  . albuterol (PROVENTIL HFA;VENTOLIN HFA) 108 (90 BASE) MCG/ACT inhaler Inhale 2 puffs into the lungs every 4 (four) hours as needed for wheezing or shortness of breath.      . fentaNYL (DURAGESIC - DOSED MCG/HR) 25 MCG/HR patch Place 25 mcg onto the skin every 3 (three) days.      . fish oil-omega-3 fatty acids 1000 MG capsule Take 2 g by mouth daily.        . Fluticasone-Salmeterol (ADVAIR) 250-50 MCG/DOSE AEPB Inhale 1 puff into the lungs 2 (two) times daily.      [name] levothyroxine (SYNTHROID, LEVOTHROID) 125 MCG tablet Take 125 mcg by mouth daily before breakfast.      . nitroGLYCERIN (NITROSTAT) 0.4 MG SL tablet Place 0.4 mg under the tongue every 5 (five) minutes as needed for chest pain.      . ranolazine (RANEXA) 500 MG 12 hr tablet Take 500 mg by mouth 2 (two) times daily.      . rosuvastatin (  CRESTOR) 40 MG tablet Take 40 mg by mouth daily.      . ticagrelor (BRILINTA) 90 MG TABS tablet Take 90 mg by mouth 2 (two) times daily.      [name] tiotropium (SPIRIVA) 18 MCG inhalation capsule Place 18 mcg into inhaler and inhale daily.      [name] warfarin (COUMADIN) 5 MG tablet Take 5-7.5 mg by mouth See admin instructions. Takes one and one-half tablet (7.5mg  total) on all days except Tuesdays. Takes one tablet (5mg  total) on Tuesdays only        Assessment: 63 yo F on chronic Coumadin for hx PE, Factor V Leiden.  Home dose listed above.  INR was sub-therapeutic on admission, but at lower end of goal range today. No bleeding noted.  She was admitted with PNA and is on Rocephin & Zithromax.  Zithromax can increase INR.   Goal of Therapy:  INR 2-3   Plan:  Coumadin 7.5mg  po x1 today Daily INR D/C sq heparin since INR therapeutic  Biagio Borg 08/26/2013,5:08 PM

## 2013-08-26 NOTE — Progress Notes (Signed)
[facility] PROGRESS NOTE  [name] CXK:[id number] DOB: 1950-10-25 DOA: 08/25/2013 PCP: Drema Pry, DO  Interim summary Patient is a 63 year old female with a past medical history of factor V Leyden, history of pulmonary embolism, chronic obstructive palmar disease, tobacco abuse, who recently left AGAINST MEDICAL ADVICE from the medicine service at [facility] on 08/24/2013. She had presented to that facility on 08/24/2013 with complaints of chest pain and shortness of breath. She presented to the emergency room at [facility] on 08/25/2048 with complaints of ongoing chest pain having increased shortness of breath. She was worked up with a CT scan of lungs with IV contrast which did not show evidence of pulmonary embolism however did show a new mild segmental infiltrate in the right lower lobe which could be consistent with pneumonia. She was started on empiric IV antibiotic therapy with ACE azithromycin and ceftriaxone.  Assessment/Plan: 1. Community acquired pneumonia -She presents with clinical signs and symptoms consistent with pneumonia. A CT scan of lungs with IV contrast showing a new segmental infiltrate in the right lower lobe. -Will continue empiric IV antibiotic therapy with ceftriaxone and azithromycin -Provide supportive care, repeat chest x-ray in a.m.  2. Chronic obstructive pulmonary disease -Patient with extensive history of tobacco abuse -She reports intolerance to steroids  -Will schedule Duonebs every 4 hours while awake  3. History of factor V Leyden -Patient with history of factor V Leyden and pulmonary embolism. -Will continue chronic anticoagulation with warfarin -INR therapeutic at 2.0 -Pharmacy consultation for warfarin dosing  4. History of coronary artery disease -Patient's chest pain symptoms seem to be more consistent with pneumonia and underlying pulmonary disease rather than unstable angina. -She has had multiple sets of troponin  which have come back negative. -Will continue aspirin and Brilinta therapy for now  5. Hypothyroidism.  -Continue levothyroxine 125 mcg by mouth daily  6. DVT prophylaxis. Patient fully anticoagulated with warfarin  Code Status: Full code Family Communication:  Disposition Plan: Continue IV AB therapy, supportive care, anticipate discharge to home when medically stable.    Antibiotics:  Azithromycin (started 08/25/2013)  Ceftriaxone (started 08/25/2013)  HPI/Subjective: Patient reports feeling a little better this morning. They she is breathing easier. She was started on empiric IV antibiotic therapy overnight for probable community-acquired pneumonia  Objective: Filed Vitals:   08/26/13 1456  BP: 101/59  Pulse: 69  Temp:   Resp: 18    Intake/Output Summary (Last 24 hours) at 08/26/13 1643 Last data filed at 08/26/13 1300  Gross per 24 hour  Intake    679 ml  Output      2 ml  Net    677 ml   Filed Weights   08/25/13 1537 08/25/13 2019  Weight: 87.998 kg (194 lb) 90.719 kg (200 lb)    Exam:   General:  She is in no acute distress, mentating well  Cardiovascular: Regular rate and rhythm normal S1-S2  Respiratory: Diminished breath sounds with the presence of bilateral expiratory wheezes  Abdomen: Soft nontender nondistended  Musculoskeletal: No edema   Data Reviewed: Basic Metabolic Panel:  Recent Labs Lab 08/24/13 0007 08/25/13 1640 08/26/13 0211  NA 140 142 141  K 4.1 3.9 4.4  CL 102 101 101  CO2 27 30 29   GLUCOSE 120* 113* 155*  BUN 19 19 20   CREATININE 1.00 1.03 1.02  CALCIUM 9.1 8.8 9.0   Liver Function Tests:  Recent Labs Lab 08/26/13 0211  AST 20  ALT 13  ALKPHOS 97  BILITOT 0.2*  PROT 7.1  ALBUMIN 3.3*   No results found for this basename: LIPASE, AMYLASE,  in the last 168 hours No results found for this basename: AMMONIA,  in the last 168 hours CBC:  Recent Labs Lab 08/24/13 0007 08/25/13 1640 08/26/13 0211  WBC 6.7  9.6 10.4  NEUTROABS  --  7.4  --   HGB 15.8* 14.8 14.3  HCT 46.3* 44.6 43.3  MCV 93.3 93.5 93.3  PLT 248 248 218   Cardiac Enzymes:  Recent Labs Lab 08/24/13 0330 08/25/13 1640 08/25/13 2042 08/26/13 0211 08/26/13 0829  TROPONINI <0.30 <0.30 <0.30 <0.30 <0.30   BNP (last 3 results) No results found for this basename: PROBNP,  in the last 8760 hours CBG:  Recent Labs Lab 08/26/13 0727  GLUCAP 136*    Recent Results (from the past 240 hour(s))  MRSA PCR SCREENING     Status: None   Collection Time    08/24/13  6:48 AM      Result Value Ref Range Status   MRSA by PCR NEGATIVE  NEGATIVE Final   Comment:            The GeneXpert MRSA Assay (FDA     approved for NASAL specimens     only), is one component of a     comprehensive MRSA colonization     surveillance program. It is not     intended to diagnose MRSA     infection nor to guide or     monitor treatment for     MRSA infections.     Studies: Dg Chest 2 View  08/26/2013   CLINICAL DATA:  COPD exacerbation. Followup right lower lobe pneumonia.  EXAM: CHEST  2 VIEW  COMPARISON:  CTA chest yesterday. Portable chest x-ray 08/24/2013. Two-view chest x-ray 06/13/2011.  FINDINGS: Cardiac silhouette mildly enlarged, likely secondary to the pericardial effusion identified on CT yesterday. Thoracic aorta atherosclerotic, unchanged. Hilar and mediastinal contours otherwise unremarkable. Emphysematous changes in the upper lobes, left greater than right. No change in the streaky airspace opacity at the right lung base. Stable scarring in the left lower lobe and lingula. No new pulmonary parenchymal abnormalities. Visualized bony thorax intact.  IMPRESSION: Stable streaky pneumonia in the right lower lobe. Stable scarring in the left lower lobe and lingula. No new abnormalities.   Electronically Signed   By: Evangeline Dakin M.D.   On: 08/26/2013 09:12   Ct Angio Chest Pe W/cm &/or Wo Cm  08/25/2013   CLINICAL DATA:  Chest pain,  shortness of breath, history MI, COPD, smoking, factor 5 Leiden deficiency, prior pulmonary embolism.  EXAM: CT ANGIOGRAPHY CHEST WITH CONTRAST  TECHNIQUE: Multidetector CT imaging of the chest was performed using the standard protocol during bolus administration of intravenous contrast. Multiplanar CT image reconstructions and MIPs were obtained to evaluate the vascular anatomy.  CONTRAST:  128mL OMNIPAQUE IOHEXOL 350 MG/ML SOLN  COMPARISON:  03/27/2012  FINDINGS: Scattered atherosclerotic calcifications aorta and coronary arteries.  No aortic aneurysm or dissection.  Small pericardial effusion.  Visualized upper abdomen unremarkable.  No thoracic adenopathy.  Pulmonary arteries patent.  No evidence of pulmonary embolism.  Emphysematous and bronchitic changes.  Chronic atelectasis versus scarring in LEFT lower lobe.  Minimal patchy segmental infiltrate in posterior RIGHT lower lobe.  Remaining lungs clear without pleural effusion or pneumothorax.  No acute osseous findings.  Review of the MIP images confirms the above findings.  IMPRESSION: No evidence of pulmonary embolism.  Small pericardial  effusion increased since previous exam.  Emphysematous and bronchitic changes consistent with COPD.  New mild segmental infiltrate in the RIGHT lower lobe with chronic atelectasis versus scarring in LEFT lower lobe.   Electronically Signed   By: Lavonia Dana M.D.   On: 08/25/2013 17:24    Scheduled Meds: . aspirin EC  81 mg Oral Daily  . atorvastatin  80 mg Oral q1800  . azithromycin  500 mg Intravenous Q24H  . cefTRIAXone (ROCEPHIN)  IV  1 g Intravenous Q24H  . fentaNYL  25 mcg Transdermal Q72H  . folic acid  1 mg Oral Daily  . heparin  5,000 Units Subcutaneous 3 times per day  . levothyroxine  125 mcg Oral QAC breakfast  . mometasone-formoterol  2 puff Inhalation BID  . multivitamin with minerals  1 tablet Oral Daily  . ranolazine  500 mg Oral BID  . sodium chloride  3 mL Intravenous Q12H  . thiamine  100 mg  Oral Daily  . ticagrelor  90 mg Oral BID  . tiotropium  18 mcg Inhalation Daily  . warfarin  5 mg Oral Once per day on Tue  . warfarin  7.5 mg Oral Once per day on Sun Mon Wed Thu Fri Sat  . Warfarin - Physician Dosing Inpatient   Does not apply q1800   Continuous Infusions: . sodium chloride 50 mL/hr at 08/25/13 2105    Principal Problem:   Pneumonia Active Problems:   Chest pain   TOBACCO ABUSE   PULMONARY EMBOLISM   Chronic obstructive pulmonary disease   HYPOTHYROIDISM   Factor V Leiden, prothrombin gene mutation   Chronic anticoagulation    Time spent: 35 min    Kelvin Cellar  [facility] Pager [phone]. If 7PM-7AM, please contact night-coverage at [email or web address], password [facility] 08/26/2013, 4:43 PM  LOS: 1 day

## 2013-08-26 NOTE — Progress Notes (Signed)
UR completed 

## 2013-08-27 ENCOUNTER — Inpatient Hospital Stay (HOSPITAL_COMMUNITY): Payer: Medicare Other

## 2013-08-27 ENCOUNTER — Telehealth: Payer: Self-pay | Admitting: *Deleted

## 2013-08-27 ENCOUNTER — Encounter (HOSPITAL_COMMUNITY): Payer: Self-pay | Admitting: Internal Medicine

## 2013-08-27 DIAGNOSIS — I7090 Unspecified atherosclerosis: Secondary | ICD-10-CM

## 2013-08-27 DIAGNOSIS — I2510 Atherosclerotic heart disease of native coronary artery without angina pectoris: Secondary | ICD-10-CM

## 2013-08-27 DIAGNOSIS — J9690 Respiratory failure, unspecified, unspecified whether with hypoxia or hypercapnia: Secondary | ICD-10-CM | POA: Diagnosis present

## 2013-08-27 DIAGNOSIS — R0902 Hypoxemia: Secondary | ICD-10-CM

## 2013-08-27 DIAGNOSIS — J9610 Chronic respiratory failure, unspecified whether with hypoxia or hypercapnia: Secondary | ICD-10-CM

## 2013-08-27 HISTORY — DX: Chronic respiratory failure, unspecified whether with hypoxia or hypercapnia: J96.10

## 2013-08-27 LAB — BASIC METABOLIC PANEL
Anion gap: 8 (ref 5–15)
BUN: 21 mg/dL (ref 6–23)
CO2: 31 mEq/L (ref 19–32)
CREATININE: 0.97 mg/dL (ref 0.50–1.10)
Calcium: 8.2 mg/dL — ABNORMAL LOW (ref 8.4–10.5)
Chloride: 102 mEq/L (ref 96–112)
GFR calc non Af Amer: 61 mL/min — ABNORMAL LOW (ref >90)
GFR, EST AFRICAN AMERICAN: 71 mL/min — AB (ref >90)
Glucose, Bld: 99 mg/dL (ref 70–99)
Potassium: 4.2 mEq/L (ref 3.7–5.3)
Sodium: 141 mEq/L (ref 137–147)

## 2013-08-27 LAB — CBC
HCT: 39.5 % (ref 36.0–46.0)
HEMOGLOBIN: 12.8 g/dL (ref 12.0–15.0)
MCH: 30.5 pg (ref 26.0–34.0)
MCHC: 32.4 g/dL (ref 30.0–36.0)
MCV: 94.3 fL (ref 78.0–100.0)
Platelets: 204 10*3/uL (ref 150–400)
RBC: 4.19 MIL/uL (ref 3.87–5.11)
RDW: 15.8 % — AB (ref 11.5–15.5)
WBC: 10.5 10*3/uL (ref 4.0–10.5)

## 2013-08-27 LAB — PROTIME-INR
INR: 2.79 — AB (ref 0.00–1.49)
Prothrombin Time: 29.4 seconds — ABNORMAL HIGH (ref 11.6–15.2)

## 2013-08-27 MED ORDER — WARFARIN SODIUM 5 MG PO TABS: 5.0000 mg | ORAL_TABLET | Freq: Once | ORAL | Status: DC

## 2013-08-27 MED ORDER — LEVOFLOXACIN 500 MG PO TABS: 500.0000 mg | ORAL_TABLET | Freq: Every day | ORAL | Status: DC

## 2013-08-27 MED ORDER — NITROGLYCERIN 0.4 MG SL SUBL: 0.4000 mg | SUBLINGUAL_TABLET | SUBLINGUAL | Status: DC | PRN

## 2013-08-27 MED ORDER — AZITHROMYCIN 250 MG PO TABS
500.0000 mg | ORAL_TABLET | Freq: Every day | ORAL | Status: DC
  Administered 2013-08-27: 500 mg via ORAL
  Filled 2013-08-27: qty 2

## 2013-08-27 NOTE — Telephone Encounter (Signed)
Medication sent to pharmacy  

## 2013-08-27 NOTE — Discharge Summary (Signed)
Physician Discharge Summary  [name] CBJ:[id number] DOB: [birth year] DOA: 08/25/2013  PCP: Drema Pry, DO  Admit date: 08/25/2013 Discharge date: 08/27/2013  Time spent: Greater than 30 minutes minutes  Recommendations for Outpatient Follow-up:  1. The patient will need her INR rechecked in the setting of antibiotic therapy. Appointment made at the Coumadin clinic in 2 days.  2. The patient will need her TSH and free T4 reassessed in 3-4 months following consistent compliance with Synthroid.  Discharge Diagnoses:  1. Acute on chronic chest pain, myocardial infarction ruled out; CT angiogram of the chest negative for pulmonary embolism. 2. Community acquired pneumonia. 3. Extensive coronary artery disease, status post intervention with PCI and stent placement and thrombectomy secondary to in-stent stenosis/thrombosis. 4. 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 On chronic Coumadin. 5. COPD, oxygen dependent. 6. Acute on chronic respiratory failure with hypercapnia secondary to pneumonia. (On admission, ABG revealed PH 7.3, PCO2 57, PO2 78 on oxygen on 08/25/13). 7. Hypothyroidism with noncompliance with medication therapy. TSH was 91. 8. Tobacco abuse. The patient was advised to stop smoking. 9. Mild hyperglycemia. Hemoglobin A1c was 6.0.  Discharge Condition: Improved.  Diet recommendation: Heart healthy.  Filed Weights   08/25/13 1537 08/25/13 2019  Weight: 87.998 kg (194 lb) 90.719 kg (200 lb)    History of present illness:  The patient is a 63 year old woman with significant coronary artery disease, COPD, and pulmonary embolism, who presented to the emergency department on 08/25/2013 with a chief complaint of chest pain. Apparently, she left [facility] hospital AMA the day before because of some dispute about her home medications. Because of her persistent chest pain, she presented to the ED at [facility]. In the ED, she  was afebrile and hemodynamically stable. She was oxygenating 85-87% on room air, but it improved to 95% with 2 L of nasal cannula oxygen. Her ABG revealed a pH of 7.34, PCO2 of 57, and PO2 of 78. Her troponin I was within normal limits. Her INR was 1.95. CT angiogram of her chest revealed no evidence of pulmonary embolism, emphysematous and bronchitic changes consistent with COPD, and new mild segmental infiltrate in the right lower lobe with chronic atelectasis/left lower lobe scarring. Her EKG revealed sinus tachycardia with a heart rate of 106 beats per minute, old Q waves in anterior leads, nonspecific T. and ST abnormalities. She was admitted for further evaluation and management.  Hospital Course:  The patient was restarted on all of her chronic medications including all of her chronic cardiac medications. Oxygen was applied and titrated to keep her oxygen saturations greater than 90%. She was restarted on her chronic inhalers for COPD. Rocephin and azithromycin were given for treatment of community-acquired pneumonia. She was restarted on her fentanyl patch for chronic pain. Coumadin was continued and adjusted by the pharmacy staff. For further evaluation, a number of studies were ordered. Her troponin I was negative x3. Her hemoglobin A1c was 6.0. Her TSH was 91. Her followup INR increased to 2.0 and then subsequently to 2.79. Rapid increase in her INR was thought to be secondary to antibiotic therapy. She was questioned about compliance with Synthroid. She apparently had been out of Synthroid for couple of weeks secondary to financial issues. Therefore, she was maintained on 125 mcg daily. Would recommend followup evaluation of her thyroid function with TSH and free T4 in 3-4 months after the patient has been on and taking Synthroid consistently. The patient was encouraged  to take Synthroid as prescribed without missing any doses and to notify her primary care physician if she is unable to afford it.  (The patient just filled the prescription a couple of days ago). She voiced understanding.  Over the course of the hospitalization, the patient's pain subsided and then completely resolved. Etiology of her chest pain was likely from a contribution from pneumonia, although, the patient has chronic chest pain. She was discharged on her chronic medications without any changes. She was instructed to followup with the Coumadin clinic in 2 days for reevaluation of her INR on antibiotic therapy. She was discharged on Levaquin. She was also instructed to stop smoking completely.  Procedures:  None  Consultations:  None  Discharge Exam: Filed Vitals:   08/27/13 0628  BP: 97/59  Pulse: 78  Temp: 98 F (36.7 C)  Resp: 16    General: Pleasant 63 year old woman sitting up in bed, in no acute distress. Cardiovascular: S1, S2, with a 9-3/8 systolic murmur. Respiratory: Rare crackles on the right, otherwise clear; breathing nonlabored. Abdomen: Positive bowel sounds, soft, nontender, nondistended. Extremities: No pedal edema.  Discharge Instructions You were cared for by a hospitalist during your hospital stay. If you have any questions about your discharge medications or the care you received while you were in the hospital after you are discharged, you can call the unit and asked to speak with the hospitalist on call if the hospitalist that took care of you is not available. Once you are discharged, your primary care physician will handle any further medical issues. Please note that NO REFILLS for any discharge medications will be authorized once you are discharged, as it is imperative that you return to your primary care physician (or establish a relationship with a primary care physician if you do not have one) for your aftercare needs so that they can reassess your need for medications and monitor your lab values.  Discharge Instructions   Diet - low sodium heart healthy    Complete by:  As  directed      Discharge instructions    Complete by:  As directed   You will need your thyroid blood test rechecked in 2-3 months. Avoid missing doses or your thyroid medication.     Increase activity slowly    Complete by:  As directed             Medication List         albuterol 108 (90 BASE) MCG/ACT inhaler  Commonly known as:  PROVENTIL HFA;VENTOLIN HFA  Inhale 2 puffs into the lungs every 4 (four) hours as needed for wheezing or shortness of breath.     fentaNYL 25 MCG/HR patch  Commonly known as:  DURAGESIC - dosed mcg/hr  Place 25 mcg onto the skin every 3 (three) days.     fish oil-omega-3 fatty acids 1000 MG capsule  Take 2 g by mouth daily.     Fluticasone-Salmeterol 250-50 MCG/DOSE Aepb  Commonly known as:  ADVAIR  Inhale 1 puff into the lungs 2 (two) times daily.     levofloxacin 500 MG tablet  Commonly known as:  LEVAQUIN  Take 1 tablet (500 mg total) by mouth daily. Antibiotic to be taken for 6 more days.     levothyroxine 125 MCG tablet  Commonly known as:  SYNTHROID, LEVOTHROID  Take 125 mcg by mouth daily before breakfast.     nitroGLYCERIN 0.4 MG SL tablet  Commonly known as:  NITROSTAT  Place 0.4 mg  under the tongue every 5 (five) minutes as needed for chest pain.     ranolazine 500 MG 12 hr tablet  Commonly known as:  RANEXA  Take 500 mg by mouth 2 (two) times daily.     rosuvastatin 40 MG tablet  Commonly known as:  CRESTOR  Take 40 mg by mouth daily.     ticagrelor 90 MG Tabs tablet  Commonly known as:  BRILINTA  Take 90 mg by mouth 2 (two) times daily.     tiotropium 18 MCG inhalation capsule  Commonly known as:  SPIRIVA  Place 18 mcg into inhaler and inhale daily.     warfarin 5 MG tablet  Commonly known as:  COUMADIN  Take 5-7.5 mg by mouth See admin instructions. Takes one and one-half tablet (7.5mg  total) on all days except Tuesdays. Takes one tablet (5mg  total) on Tuesdays only       Allergies  Allergen Reactions  .  Dilaudid [Hydromorphone Hcl] Hives and Nausea Only  . Minocycline Hcl     REACTION: Dizzy  . Prednisone     REACTION: feels like throat swelling  . Varenicline Tartrate     REACTION: Dizzy(chantix)   . Zocor [Simvastatin - High Dose] Other (See Comments)    myalgia       Follow-up Information   Please follow up. (FOLLOW UP AT THE COUMADIN CLINIC IN [place] FRIDAY 08/29/13 AT 10:50 AM.)        The results of significant diagnostics from this hospitalization (including imaging, microbiology, ancillary and laboratory) are listed below for reference.    Significant Diagnostic Studies: Dg Chest 2 View  08/27/2013   CLINICAL DATA:  Pneumonia  EXAM: CHEST  2 VIEW  COMPARISON:  08/26/2013; 08/24/2013; chest CT - 08/25/2013  FINDINGS: Grossly unchanged borderline enlarged cardiac silhouette. Post coronary artery stent placement. Normal mediastinal contours. The lungs remain hyperexpanded with flattening of the bilateral diaphragms and blunting of the bilateral costophrenic angles. There is grossly unchanged thinning of the biapical pulmonary parenchyma, left greater than right. Grossly unchanged linear heterogeneous opacities within the posterior aspect of the left lower lobe, favored to represent atelectasis or scar. No pleural effusion or pneumothorax. No evidence of edema.  IMPRESSION: Hyperexpanded lungs and left basilar atelectasis / scar without acute cardiopulmonary disease.   Electronically Signed   By: Sandi Mariscal M.D.   On: 08/27/2013 08:52   Dg Chest 2 View  08/26/2013   CLINICAL DATA:  COPD exacerbation. Followup right lower lobe pneumonia.  EXAM: CHEST  2 VIEW  COMPARISON:  CTA chest yesterday. Portable chest x-ray 08/24/2013. Two-view chest x-ray 06/13/2011.  FINDINGS: Cardiac silhouette mildly enlarged, likely secondary to the pericardial effusion identified on CT yesterday. Thoracic aorta atherosclerotic, unchanged. Hilar and mediastinal contours otherwise unremarkable. Emphysematous  changes in the upper lobes, left greater than right. No change in the streaky airspace opacity at the right lung base. Stable scarring in the left lower lobe and lingula. No new pulmonary parenchymal abnormalities. Visualized bony thorax intact.  IMPRESSION: Stable streaky pneumonia in the right lower lobe. Stable scarring in the left lower lobe and lingula. No new abnormalities.   Electronically Signed   By: Evangeline Dakin M.D.   On: 08/26/2013 09:12   Ct Angio Chest Pe W/cm &/or Wo Cm  08/25/2013   CLINICAL DATA:  Chest pain, shortness of breath, history MI, COPD, smoking, factor 5 Leiden deficiency, prior pulmonary embolism.  EXAM: CT ANGIOGRAPHY CHEST WITH CONTRAST  TECHNIQUE: Multidetector CT imaging of  the chest was performed using the standard protocol during bolus administration of intravenous contrast. Multiplanar CT image reconstructions and MIPs were obtained to evaluate the vascular anatomy.  CONTRAST:  125mL OMNIPAQUE IOHEXOL 350 MG/ML SOLN  COMPARISON:  03/27/2012  FINDINGS: Scattered atherosclerotic calcifications aorta and coronary arteries.  No aortic aneurysm or dissection.  Small pericardial effusion.  Visualized upper abdomen unremarkable.  No thoracic adenopathy.  Pulmonary arteries patent.  No evidence of pulmonary embolism.  Emphysematous and bronchitic changes.  Chronic atelectasis versus scarring in LEFT lower lobe.  Minimal patchy segmental infiltrate in posterior RIGHT lower lobe.  Remaining lungs clear without pleural effusion or pneumothorax.  No acute osseous findings.  Review of the MIP images confirms the above findings.  IMPRESSION: No evidence of pulmonary embolism.  Small pericardial effusion increased since previous exam.  Emphysematous and bronchitic changes consistent with COPD.  New mild segmental infiltrate in the RIGHT lower lobe with chronic atelectasis versus scarring in LEFT lower lobe.   Electronically Signed   By: Lavonia Dana M.D.   On: 08/25/2013 17:24   Dg Chest  Port 1 View  08/24/2013   CLINICAL DATA:  Chest pain.  EXAM: PORTABLE CHEST - 1 VIEW  COMPARISON:  Chest x-ray 06/13/2011.  FINDINGS: There is hyperinflation of the lungs compatible with COPD. Heart is normal size. Left base atelectasis or scarring. Right lung is clear. No effusions. No acute bony abnormality.  IMPRESSION: COPD.  Left basilar atelectasis or scarring.   Electronically Signed   By: Rolm Baptise M.D.   On: 08/24/2013 00:25    Microbiology: Recent Results (from the past 240 hour(s))  MRSA PCR SCREENING     Status: None   Collection Time    08/24/13  6:48 AM      Result Value Ref Range Status   MRSA by PCR NEGATIVE  NEGATIVE Final   Comment:            The GeneXpert MRSA Assay (FDA     approved for NASAL specimens     only), is one component of a     comprehensive MRSA colonization     surveillance program. It is not     intended to diagnose MRSA     infection nor to guide or     monitor treatment for     MRSA infections.     Labs: Basic Metabolic Panel:  Recent Labs Lab 08/24/13 0007 08/25/13 1640 08/26/13 0211 08/27/13 0558  NA 140 142 141 141  K 4.1 3.9 4.4 4.2  CL 102 101 101 102  CO2 27 30 29 31   GLUCOSE 120* 113* 155* 99  BUN 19 19 20 21   CREATININE 1.00 1.03 1.02 0.97  CALCIUM 9.1 8.8 9.0 8.2*   Liver Function Tests:  Recent Labs Lab 08/26/13 0211  AST 20  ALT 13  ALKPHOS 97  BILITOT 0.2*  PROT 7.1  ALBUMIN 3.3*   No results found for this basename: LIPASE, AMYLASE,  in the last 168 hours No results found for this basename: AMMONIA,  in the last 168 hours CBC:  Recent Labs Lab 08/24/13 0007 08/25/13 1640 08/26/13 0211 08/27/13 0558  WBC 6.7 9.6 10.4 10.5  NEUTROABS  --  7.4  --   --   HGB 15.8* 14.8 14.3 12.8  HCT 46.3* 44.6 43.3 39.5  MCV 93.3 93.5 93.3 94.3  PLT 248 248 218 204   Cardiac Enzymes:  Recent Labs Lab 08/24/13 0330 08/25/13 1640 08/25/13 2042 08/26/13 0211  08/26/13 0829  TROPONINI <0.30 <0.30 <0.30 <0.30  <0.30   BNP: BNP (last 3 results) No results found for this basename: PROBNP,  in the last 8760 hours CBG:  Recent Labs Lab 08/26/13 0727  GLUCAP 136*       Signed:  Celsey Asselin  [facility] 08/27/2013, 12:08 PM

## 2013-08-27 NOTE — Progress Notes (Signed)
NURSING PROGRESS NOTE  [name] [id number] Discharge Data: 08/27/2013 1:13 PM Attending Provider: Rexene Alberts, MD NWG:[id number] Shawna Orleans, DO   [name] to be D/C'd Home per MD order.    All IV's will be discontinued and monitored for bleeding.  All belongings will be returned to patient for patient to take home.  AVS summary reviewed with patient. Prescriptions included.   Patient will leave floor by ambulating, escorted by NT.  Last Documented Vital Signs:  Blood pressure 97/59, pulse 78, temperature 98 F (36.7 C), temperature source Oral, resp. rate 16, height 5\' 6"  (1.676 m), weight 90.719 kg (200 lb), SpO2 97.00%.  [name]

## 2013-08-27 NOTE — Progress Notes (Signed)
ANTICOAGULATION CONSULT NOTE - follow up  Pharmacy Consult for Coumadin Indication: Hx PE, Factor V Leiden  Allergies  Allergen Reactions  . Dilaudid [Hydromorphone Hcl] Hives and Nausea Only  . Minocycline Hcl     REACTION: Dizzy  . Prednisone     REACTION: feels like throat swelling  . Varenicline Tartrate     REACTION: Dizzy(chantix)   . Zocor [Simvastatin - High Dose] Other (See Comments)    myalgia   Patient Measurements: Height: 5\' 6"  (167.6 cm) Weight: 200 lb (90.719 kg) IBW/kg (Calculated) : 59.3  Vital Signs: Temp: 98 F (36.7 C) (08/19 0628) Temp src: Oral (08/19 0628) BP: 97/59 mmHg (08/19 0628) Pulse Rate: 78 (08/19 0628)  Labs:  Recent Labs  08/25/13 1640 08/25/13 2042 08/26/13 0211 08/26/13 0829 08/27/13 0558  HGB 14.8  --  14.3  --  12.8  HCT 44.6  --  43.3  --  39.5  PLT 248  --  218  --  204  LABPROT 22.2*  --  22.7*  --  29.4*  INR 1.95*  --  2.00*  --  2.79*  CREATININE 1.03  --  1.02  --  0.97  TROPONINI <0.30 <0.30 <0.30 <0.30  --    Estimated Creatinine Clearance: 67.4 ml/min (by C-G formula based on Cr of 0.97).  Medical History: Past Medical History  Diagnosis Date  . Arteriosclerotic cardiovascular disease (ASCVD) 2002    Inf STEMI-2002. 2003-cutting balloon + brachytherapy for restenosis; subsequent acute stent thrombosis 06/2010 requiring 2 separate interventions (Zeta stent, then repeat cath with thrombectomy). focal basal inf AK, nl EF; 03/2011: Patent stents, minor nonobst  residual dz, nl EF; neg stress nuclear in 2008 and stress echo in 2009  . Pulmonary embolism 2006    Associated with deep vein thrombosis-2006; + factor V Leiden  . Factor V Leiden, prothrombin gene mutation 2006  . Hyperlipidemia   . COPD (chronic obstructive pulmonary disease)   . Tobacco abuse     50 pack years  . Hypothyroidism   . Noncompliance   . Pelvic fracture 2009  . Chronic anticoagulation     Warfarin plus ticagrelor  . Acute MI     x4, code  blue x3   Medications:  Prescriptions prior to admission  Medication Sig Dispense Refill  . albuterol (PROVENTIL HFA;VENTOLIN HFA) 108 (90 BASE) MCG/ACT inhaler Inhale 2 puffs into the lungs every 4 (four) hours as needed for wheezing or shortness of breath.      . fentaNYL (DURAGESIC - DOSED MCG/HR) 25 MCG/HR patch Place 25 mcg onto the skin every 3 (three) days.      . fish oil-omega-3 fatty acids 1000 MG capsule Take 2 g by mouth daily.        . Fluticasone-Salmeterol (ADVAIR) 250-50 MCG/DOSE AEPB Inhale 1 puff into the lungs 2 (two) times daily.      [name] levothyroxine (SYNTHROID, LEVOTHROID) 125 MCG tablet Take 125 mcg by mouth daily before breakfast.      . nitroGLYCERIN (NITROSTAT) 0.4 MG SL tablet Place 0.4 mg under the tongue every 5 (five) minutes as needed for chest pain.      . ranolazine (RANEXA) 500 MG 12 hr tablet Take 500 mg by mouth 2 (two) times daily.      . rosuvastatin (CRESTOR) 40 MG tablet Take 40 mg by mouth daily.      . ticagrelor (BRILINTA) 90 MG TABS tablet Take 90 mg by mouth 2 (two) times daily.      [name]  tiotropium (SPIRIVA) 18 MCG inhalation capsule Place 18 mcg into inhaler and inhale daily.      [name] warfarin (COUMADIN) 5 MG tablet Take 5-7.5 mg by mouth See admin instructions. Takes one and one-half tablet (7.5mg  total) on all days except Tuesdays. Takes one tablet (5mg  total) on Tuesdays only       Assessment: 63 yo F on chronic Coumadin for hx PE, Factor V Leiden.  Home dose listed above.  INR was sub-therapeutic on admission, but is now rising quickly and is at upper end of therapeutic range.   No bleeding noted.  She was admitted with PNA and is on Rocephin & Zithromax.  Zithromax can increase INR.   Goal of Therapy:  INR 2-3   Plan:  Coumadin 5mg  po x1 today Daily INR D/C sq heparin since INR therapeutic  [place] Crane, Annielee Jemmott A 08/27/2013,8:52 AM

## 2013-08-27 NOTE — Telephone Encounter (Signed)
Pt needs nitro called in today. She just got discharged from hospital and will wait to go to pharmacy till after 5

## 2013-08-27 NOTE — Care Management Note (Signed)
    Page 1 of 1   08/27/2013     10:34:22 AM CARE MANAGEMENT NOTE 08/27/2013  Patient:  LAQUISHA, NORTHCRAFT   Account Number:  [id number]  Date Initiated:  08/27/2013  Documentation initiated by:  Jolene Provost  Subjective/Objective Assessment:   Patient admisted from home with SOB. Patient from home alone, independent wtih ADL's. Patient has a cane that she uses with ambulation and a walker with wheels that she does not use. Pt has no active HH services and no medication needs     Action/Plan:   at this time. Patient has home O2 that she gets from [place] and uses only at night. Patient plansto discharge home today with self care. Patient has no CM needs at this time.   Anticipated DC Date:  08/27/2013   Anticipated DC Plan:  [place]  CM consult      Choice offered to / List presented to:  C-1 Patient   DME arranged  [facility]      DME agency  [facility]        Status of service:  Completed, signed off Medicare Important Message given?   (If response is "NO", the following Medicare IM given date fields will be blank) Date Medicare IM given:   Medicare IM given by:   Date Additional Medicare IM given:   Additional Medicare IM given by:    Discharge Disposition:  HOME/SELF CARE  Per UR Regulation:    If discussed at Long Length of Stay Meetings, dates discussed:    Comments:  08/27/2013 [place], RN, MSN, [facility]

## 2013-08-28 ENCOUNTER — Telehealth: Payer: Self-pay | Admitting: Internal Medicine

## 2013-08-28 NOTE — Telephone Encounter (Signed)
Pt has appt 09/18/13, don't have anything sooner, pt aware to keep appt

## 2013-08-28 NOTE — Telephone Encounter (Addendum)
Pt was talking to you and call dropped. Would like a cb. Number listed is neighbor's phone, due to pt having poor reception.

## 2013-08-29 ENCOUNTER — Ambulatory Visit (INDEPENDENT_AMBULATORY_CARE_PROVIDER_SITE_OTHER): Payer: Medicare Other | Admitting: *Deleted

## 2013-08-29 DIAGNOSIS — Z7901 Long term (current) use of anticoagulants: Secondary | ICD-10-CM

## 2013-08-29 DIAGNOSIS — D6859 Other primary thrombophilia: Secondary | ICD-10-CM

## 2013-08-29 DIAGNOSIS — D6851 Activated protein C resistance: Secondary | ICD-10-CM

## 2013-08-29 DIAGNOSIS — I2699 Other pulmonary embolism without acute cor pulmonale: Secondary | ICD-10-CM | POA: Diagnosis not present

## 2013-08-29 DIAGNOSIS — Z5181 Encounter for therapeutic drug level monitoring: Secondary | ICD-10-CM

## 2013-08-29 LAB — POCT INR: INR: 2.4

## 2013-09-04 ENCOUNTER — Ambulatory Visit (INDEPENDENT_AMBULATORY_CARE_PROVIDER_SITE_OTHER): Payer: Medicare Other | Admitting: *Deleted

## 2013-09-04 DIAGNOSIS — D6859 Other primary thrombophilia: Secondary | ICD-10-CM

## 2013-09-04 DIAGNOSIS — Z5181 Encounter for therapeutic drug level monitoring: Secondary | ICD-10-CM

## 2013-09-04 DIAGNOSIS — Z7901 Long term (current) use of anticoagulants: Secondary | ICD-10-CM

## 2013-09-04 DIAGNOSIS — I2699 Other pulmonary embolism without acute cor pulmonale: Secondary | ICD-10-CM | POA: Diagnosis not present

## 2013-09-04 DIAGNOSIS — D6851 Activated protein C resistance: Secondary | ICD-10-CM

## 2013-09-04 LAB — POCT INR: INR: 1.7

## 2013-09-18 ENCOUNTER — Telehealth: Payer: Self-pay | Admitting: Internal Medicine

## 2013-09-18 ENCOUNTER — Ambulatory Visit: Payer: Medicare Other | Admitting: Internal Medicine

## 2013-09-18 NOTE — Telephone Encounter (Signed)
LMVM for daughter to call back to R/S appt. Dr Shawna Orleans is out of the office today and tomorrow.

## 2013-09-24 ENCOUNTER — Ambulatory Visit (INDEPENDENT_AMBULATORY_CARE_PROVIDER_SITE_OTHER): Payer: Medicare Other | Admitting: *Deleted

## 2013-09-24 ENCOUNTER — Encounter: Payer: Self-pay | Admitting: Pulmonary Disease

## 2013-09-24 DIAGNOSIS — D6859 Other primary thrombophilia: Secondary | ICD-10-CM | POA: Diagnosis not present

## 2013-09-24 DIAGNOSIS — Z7901 Long term (current) use of anticoagulants: Secondary | ICD-10-CM | POA: Diagnosis not present

## 2013-09-24 DIAGNOSIS — I2699 Other pulmonary embolism without acute cor pulmonale: Secondary | ICD-10-CM | POA: Diagnosis not present

## 2013-09-24 DIAGNOSIS — Z5181 Encounter for therapeutic drug level monitoring: Secondary | ICD-10-CM | POA: Diagnosis not present

## 2013-09-24 DIAGNOSIS — D6851 Activated protein C resistance: Secondary | ICD-10-CM

## 2013-09-24 LAB — POCT INR: INR: 3.3

## 2013-09-30 DIAGNOSIS — Z23 Encounter for immunization: Secondary | ICD-10-CM | POA: Diagnosis not present

## 2013-10-03 ENCOUNTER — Encounter: Payer: Self-pay | Admitting: Internal Medicine

## 2013-10-03 ENCOUNTER — Ambulatory Visit: Payer: Medicare Other | Admitting: Internal Medicine

## 2013-10-03 ENCOUNTER — Ambulatory Visit (INDEPENDENT_AMBULATORY_CARE_PROVIDER_SITE_OTHER): Payer: Medicare Other | Admitting: Internal Medicine

## 2013-10-03 VITALS — BP 104/60 | HR 88 | Temp 98.2°F | Resp 28 | Ht 66.0 in | Wt 191.0 lb

## 2013-10-03 DIAGNOSIS — E039 Hypothyroidism, unspecified: Secondary | ICD-10-CM

## 2013-10-03 DIAGNOSIS — Z7901 Long term (current) use of anticoagulants: Secondary | ICD-10-CM

## 2013-10-03 DIAGNOSIS — J9612 Chronic respiratory failure with hypercapnia: Secondary | ICD-10-CM

## 2013-10-03 DIAGNOSIS — J9610 Chronic respiratory failure, unspecified whether with hypoxia or hypercapnia: Secondary | ICD-10-CM | POA: Diagnosis not present

## 2013-10-03 DIAGNOSIS — I2510 Atherosclerotic heart disease of native coronary artery without angina pectoris: Secondary | ICD-10-CM

## 2013-10-03 DIAGNOSIS — I7090 Unspecified atherosclerosis: Secondary | ICD-10-CM

## 2013-10-03 DIAGNOSIS — R079 Chest pain, unspecified: Secondary | ICD-10-CM

## 2013-10-03 NOTE — Patient Instructions (Signed)
Limit your sodium (Salt) intake  Return in 6 months for follow-up  Cardiology and pulmonary followup as scheduled

## 2013-10-03 NOTE — Progress Notes (Signed)
Subjective:    Patient ID: [name], female    DOB: November 13, 1950, 63 y.o.   MRN: [id number]  HPI  Admit date: 08/25/2013  Discharge date: 08/27/2013   Recommendations for Outpatient Follow-up:  1. The patient will need her INR rechecked in the setting of antibiotic therapy. Appointment made at the Coumadin clinic in 2 days.  2. The patient will need her TSH and free T4 reassessed in 3-4 months following consistent compliance with Synthroid.  Discharge Diagnoses:  1. Acute on chronic chest pain, myocardial infarction ruled out; CT angiogram of the chest negative for pulmonary embolism.  2. Community acquired pneumonia.  3. Extensive coronary artery disease, status post intervention with PCI and stent placement and thrombectomy secondary to in-stent stenosis/thrombosis.  4. 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 On chronic Coumadin.  5. COPD, oxygen dependent.  6. Acute on chronic respiratory failure with hypercapnia secondary to pneumonia. (On admission, ABG revealed PH 7.3, PCO2 57, PO2 78 on oxygen on 08/25/13).  7. Hypothyroidism with noncompliance with medication therapy. TSH was 91.  8. Tobacco abuse. The patient was advised to stop smoking.  9. Mild hyperglycemia. Hemoglobin A1c was 55.55.  63 year old patient who is seen following a recent hospital discharge.  She was discharged approximately one month ago after inpatient treatment of community-acquired pneumonia.  She has a history of extensive ischemic heart disease, as well as O2 dependent COPD.    Hospital records reviewed  She was noted to have hypothyroidism and had been out of her thyroid supplementation.  Since her hospital discharge.  She has been taking this faithfully.  Her pulmonary status has been fairly stable although she has noticed some increased dyspnea with the change in weather.  Is on maximal bronchodilator therapy.  No productive cough  No recurrent chest  pain.  She is scheduled for followup.  INR in 5 days.  She remains on chronic anticoagulation.  She does have cardiology and pulmonary followup scheduled Flu vaccine received last week  Past Medical History  Diagnosis Date  . Arteriosclerotic cardiovascular disease (ASCVD) 2002    Inf STEMI-2002. 2003-cutting balloon + brachytherapy for restenosis; subsequent acute stent thrombosis 06/2010 requiring 2 separate interventions (Zeta stent, then repeat cath with thrombectomy). focal basal inf AK, nl EF; 03/2011: Patent stents, minor nonobst  residual dz, nl EF; neg stress nuclear in 2008 and stress echo in 2009  . Pulmonary embolism 2006    Associated with deep vein thrombosis-2006; + factor V Leiden  . Factor V Leiden, prothrombin gene mutation 2006  . Hyperlipidemia   . COPD (chronic obstructive pulmonary disease)     02 dependent  . Tobacco abuse     50 pack years  . Hypothyroidism   . Noncompliance   . Pelvic fracture 2009  . Chronic anticoagulation     Warfarin plus ticagrelor  . Acute MI     x4, code blue x3  . Chronic respiratory failure 08/27/2013    On 2L 02    History   Social History  . Marital Status: Divorced    Spouse Name: N/A    Number of Children: N/A  . Years of Education: N/A   Occupational History  . Disabled   . Truck driver    Social History Main Topics  . Smoking status: Former Smoker -- 1.00 packs/day for 50 years    Types: Cigarettes  . Smokeless tobacco: Never Used     Comment: has not  had a cig in 4 months as of 02-27-13  . Alcohol Use: No  . Drug Use: No  . Sexual Activity: Not on file   Other Topics Concern  . Not on file   Social History Narrative  . No narrative on file    Past Surgical History  Procedure Laterality Date  . Coronary angioplasty  2002, 2003, 2012  . Colonoscopy  Approximately 2000    Negative screening study    Family History  Problem Relation Age of Onset  . Emphysema Mother   . Emphysema Sister   . Heart  disease Father   . Factor V Leiden deficiency Father   . Factor V Leiden deficiency Sister   . Factor V Leiden deficiency Daughter   . Kidney cancer Paternal Grandmother     Allergies  Allergen Reactions  . Dilaudid [Hydromorphone Hcl] Hives and Nausea Only  . Minocycline Hcl     REACTION: Dizzy  . Prednisone     REACTION: feels like throat swelling  . Varenicline Tartrate     REACTION: Dizzy(chantix)   . Zocor [Simvastatin - High Dose] Other (See Comments)    myalgia    Current Outpatient Prescriptions on File Prior to Visit  Medication Sig Dispense Refill  . albuterol (PROVENTIL HFA;VENTOLIN HFA) 108 (90 BASE) MCG/ACT inhaler Inhale 2 puffs into the lungs every 4 (four) hours as needed for wheezing or shortness of breath.      . fentaNYL (DURAGESIC - DOSED MCG/HR) 25 MCG/HR patch Place 25 mcg onto the skin every 3 (three) days.      . fish oil-omega-3 fatty acids 1000 MG capsule Take 2 g by mouth daily.        . Fluticasone-Salmeterol (ADVAIR) 250-50 MCG/DOSE AEPB Inhale 1 puff into the lungs 2 (two) times daily.      [name] levothyroxine (SYNTHROID, LEVOTHROID) 125 MCG tablet Take 125 mcg by mouth daily before breakfast.      . nitroGLYCERIN (NITROSTAT) 0.4 MG SL tablet Place 1 tablet (0.4 mg total) under the tongue every 5 (five) minutes as needed for chest pain.  25 tablet  3  . ranolazine (RANEXA) 500 MG 12 hr tablet Take 500 mg by mouth 2 (two) times daily.      . rosuvastatin (CRESTOR) 40 MG tablet Take 40 mg by mouth daily.      . ticagrelor (BRILINTA) 90 MG TABS tablet Take 90 mg by mouth 2 (two) times daily.      [name] tiotropium (SPIRIVA) 18 MCG inhalation capsule Place 18 mcg into inhaler and inhale daily.      [name] warfarin (COUMADIN) 5 MG tablet Take 5-7.5 mg by mouth See admin instructions. Takes one and one-half tablet (7.5mg  total) on all days except Tuesdays. Takes one tablet (5mg  total) on Tuesdays only       No current facility-administered medications on file prior to  visit.    BP 104/60  Pulse 88  Temp(Src) 98.2 F (36.8 C) (Oral)  Resp 28  Ht 5\' 6"  (1.676 m)  Wt 191 lb (86.637 kg)  BMI 30.84 kg/m2  SpO2 90%    Review of Systems  Constitutional: Positive for fatigue.  HENT: Negative for congestion, dental problem, hearing loss, rhinorrhea, sinus pressure, sore throat and tinnitus.   Eyes: Negative for pain, discharge and visual disturbance.  Respiratory: Positive for shortness of breath. Negative for cough.   Cardiovascular: Negative for chest pain, palpitations and leg swelling.  Gastrointestinal: Negative for nausea, vomiting, abdominal pain, diarrhea,  constipation, blood in stool and abdominal distention.  Genitourinary: Negative for dysuria, urgency, frequency, hematuria, flank pain, vaginal bleeding, vaginal discharge, difficulty urinating, vaginal pain and pelvic pain.  Musculoskeletal: Positive for gait problem. Negative for arthralgias and joint swelling.  Skin: Negative for rash.  Neurological: Negative for dizziness, syncope, speech difficulty, weakness, numbness and headaches.  Hematological: Negative for adenopathy.  Psychiatric/Behavioral: Negative for behavioral problems, dysphoric mood and agitation. The patient is not nervous/anxious.        Objective:   Physical Exam  Constitutional: She is oriented to person, place, and time. She appears well-developed and well-nourished. No distress.  Walks with a cane Blood pressure 110/64  HENT:  Head: Normocephalic.  Right Ear: External ear normal.  Left Ear: External ear normal.  Mouth/Throat: Oropharynx is clear and moist.  Eyes: Conjunctivae and EOM are normal. Pupils are equal, round, and reactive to light.  Neck: Normal range of motion. Neck supple. No thyromegaly present.  Cardiovascular: Normal rate, regular rhythm, normal heart sounds and intact distal pulses.   Pulmonary/Chest: Effort normal.  Mild tachypnea Coarse rhonchi Scattered, faint wheezing  O2 saturation  90% Pulse rate 88  Abdominal: Soft. Bowel sounds are normal. She exhibits no mass. There is no tenderness.  Musculoskeletal: Normal range of motion.  Lymphadenopathy:    She has no cervical adenopathy.  Neurological: She is alert and oriented to person, place, and time.  Skin: Skin is warm and dry. No rash noted.  Psychiatric: She has a normal mood and affect. Her behavior is normal.          Assessment & Plan:   Advanced oxygen-dependent COPD.  Followup pulmonary medicine Status post community-acquired pneumonia Hypothyroidism.  The patient has been on supplementation for 5 weeks.  We'll check a followup TSH and adjust her levothyroxine as necessary Chronic Coumadin anticoagulation.  Followup INR next week as scheduled Ischemic heart disease.  Followup cardiology

## 2013-10-03 NOTE — Progress Notes (Signed)
Pre visit review using our clinic review tool, if applicable. No additional management support is needed unless otherwise documented below in the visit note. 

## 2013-10-06 ENCOUNTER — Other Ambulatory Visit: Payer: Self-pay | Admitting: Internal Medicine

## 2013-10-06 LAB — TSH: TSH: 5.4 u[IU]/mL — AB (ref 0.35–4.50)

## 2013-10-08 ENCOUNTER — Ambulatory Visit (INDEPENDENT_AMBULATORY_CARE_PROVIDER_SITE_OTHER): Payer: Medicare Other | Admitting: *Deleted

## 2013-10-08 ENCOUNTER — Telehealth: Payer: Self-pay | Admitting: Internal Medicine

## 2013-10-08 DIAGNOSIS — D6851 Activated protein C resistance: Secondary | ICD-10-CM

## 2013-10-08 DIAGNOSIS — Z7901 Long term (current) use of anticoagulants: Secondary | ICD-10-CM | POA: Diagnosis not present

## 2013-10-08 DIAGNOSIS — D6859 Other primary thrombophilia: Secondary | ICD-10-CM

## 2013-10-08 DIAGNOSIS — I2699 Other pulmonary embolism without acute cor pulmonale: Secondary | ICD-10-CM | POA: Diagnosis not present

## 2013-10-08 DIAGNOSIS — Z5181 Encounter for therapeutic drug level monitoring: Secondary | ICD-10-CM

## 2013-10-08 LAB — POCT INR: INR: 2.9

## 2013-10-08 MED ORDER — WARFARIN SODIUM 5 MG PO TABS: ORAL_TABLET | ORAL | Status: DC

## 2013-10-08 MED ORDER — LEVOTHYROXINE SODIUM 125 MCG PO TABS
ORAL_TABLET | ORAL | Status: DC
Start: 2013-10-08 — End: 2014-03-30

## 2013-10-08 NOTE — Telephone Encounter (Signed)
Pt saw dr Raliegh Ip on 10-07-13 and needs refill on synthroid med call into [facility] in [place]

## 2013-10-08 NOTE — Telephone Encounter (Signed)
Pt notified Rx sent to pharmacy

## 2013-10-22 ENCOUNTER — Ambulatory Visit (INDEPENDENT_AMBULATORY_CARE_PROVIDER_SITE_OTHER): Payer: Medicare Other | Admitting: *Deleted

## 2013-10-22 DIAGNOSIS — Z7901 Long term (current) use of anticoagulants: Secondary | ICD-10-CM | POA: Diagnosis not present

## 2013-10-22 DIAGNOSIS — D6851 Activated protein C resistance: Secondary | ICD-10-CM | POA: Diagnosis not present

## 2013-10-22 DIAGNOSIS — Z5181 Encounter for therapeutic drug level monitoring: Secondary | ICD-10-CM

## 2013-10-22 DIAGNOSIS — I2699 Other pulmonary embolism without acute cor pulmonale: Secondary | ICD-10-CM | POA: Diagnosis not present

## 2013-10-22 LAB — POCT INR: INR: 3.1

## 2013-10-28 ENCOUNTER — Ambulatory Visit: Payer: Medicare Other | Admitting: Pulmonary Disease

## 2013-10-29 ENCOUNTER — Ambulatory Visit (INDEPENDENT_AMBULATORY_CARE_PROVIDER_SITE_OTHER): Payer: Medicare Other | Admitting: *Deleted

## 2013-10-29 ENCOUNTER — Encounter: Payer: Self-pay | Admitting: Cardiovascular Disease

## 2013-10-29 ENCOUNTER — Ambulatory Visit (INDEPENDENT_AMBULATORY_CARE_PROVIDER_SITE_OTHER): Payer: Medicare Other | Admitting: Cardiovascular Disease

## 2013-10-29 VITALS — BP 130/78 | HR 95 | Ht 66.0 in | Wt 192.0 lb

## 2013-10-29 DIAGNOSIS — E785 Hyperlipidemia, unspecified: Secondary | ICD-10-CM

## 2013-10-29 DIAGNOSIS — D6851 Activated protein C resistance: Secondary | ICD-10-CM

## 2013-10-29 DIAGNOSIS — I2699 Other pulmonary embolism without acute cor pulmonale: Secondary | ICD-10-CM | POA: Diagnosis not present

## 2013-10-29 DIAGNOSIS — J438 Other emphysema: Secondary | ICD-10-CM

## 2013-10-29 DIAGNOSIS — Z7901 Long term (current) use of anticoagulants: Secondary | ICD-10-CM | POA: Diagnosis not present

## 2013-10-29 DIAGNOSIS — I10 Essential (primary) hypertension: Secondary | ICD-10-CM

## 2013-10-29 DIAGNOSIS — Z5181 Encounter for therapeutic drug level monitoring: Secondary | ICD-10-CM

## 2013-10-29 DIAGNOSIS — F17200 Nicotine dependence, unspecified, uncomplicated: Secondary | ICD-10-CM

## 2013-10-29 DIAGNOSIS — I2510 Atherosclerotic heart disease of native coronary artery without angina pectoris: Secondary | ICD-10-CM

## 2013-10-29 DIAGNOSIS — Z720 Tobacco use: Secondary | ICD-10-CM

## 2013-10-29 LAB — POCT INR: INR: 2.1

## 2013-10-29 NOTE — Patient Instructions (Signed)
Your physician wants you to follow-up in: 6 months with Dr. Koneswaran. You will receive a reminder letter in the mail two months in advance. If you don't receive a letter, please call our office to schedule the follow-up appointment.  Your physician recommends that you continue on your current medications as directed. Please refer to the Current Medication list given to you today.  Thank you for choosing [facility]!   

## 2013-10-29 NOTE — Progress Notes (Signed)
Patient ID: [name], female   DOB: [birth year], 63 y.o.   MRN: [id number]      SUBJECTIVE: The patient presents for routine cardiovascular follow up. She has a history of coronary artery disease with prior PCI of the RCA and has had both in-stent restenosis as well as in-stent thrombosis. She has a history of pulmonary embolism and factor V Leiden deficiency, and is on chronic warfarin therapy. She is also on Brilinta. She has a history of tobacco abuse and COPD. However, she has stopped and is doing her best to remain off of them. She was hospitalized this past August for community-acquired pneumonia. CT angiography of the chest did not demonstrate pulmonary embolism and showed a small pericardial effusion with infiltrate in the RLL. She denies fevers but has been having chills ever since her hospitalization. She has tried using sublingual nitroglycerin for chronic chest pain but this has not alleviated it. I previously prescribed Ranexa but she stopped taking as it did not help. Overall, she said she is feeling well.  Review of Systems: As per "subjective", otherwise negative.  Allergies  Allergen Reactions  . Dilaudid [Hydromorphone Hcl] Hives and Nausea Only  . Minocycline Hcl     REACTION: Dizzy  . Prednisone     REACTION: feels like throat swelling  . Varenicline Tartrate     REACTION: Dizzy(chantix)   . Zocor [Simvastatin - High Dose] Other (See Comments)    myalgia    Current Outpatient Prescriptions  Medication Sig Dispense Refill  . albuterol (PROVENTIL HFA;VENTOLIN HFA) 108 (90 BASE) MCG/ACT inhaler Inhale 2 puffs into the lungs every 4 (four) hours as needed for wheezing or shortness of breath.      . fentaNYL (DURAGESIC - DOSED MCG/HR) 25 MCG/HR patch Place 25 mcg onto the skin every 3 (three) days.      . fish oil-omega-3 fatty acids 1000 MG capsule Take 2 g by mouth daily.        . Fluticasone-Salmeterol (ADVAIR) 250-50 MCG/DOSE AEPB Inhale 1 puff into the lungs 2  (two) times daily.      [name] levothyroxine (SYNTHROID, LEVOTHROID) 125 MCG tablet TAKE ONE TABLET BY MOUTH ONCE DAILY  90 tablet  1  . nitroGLYCERIN (NITROSTAT) 0.4 MG SL tablet Place 1 tablet (0.4 mg total) under the tongue every 5 (five) minutes as needed for chest pain.  25 tablet  3  . rosuvastatin (CRESTOR) 40 MG tablet Take 40 mg by mouth daily.      . ticagrelor (BRILINTA) 90 MG TABS tablet Take 90 mg by mouth 2 (two) times daily.      [name] tiotropium (SPIRIVA) 18 MCG inhalation capsule Place 18 mcg into inhaler and inhale daily.      [name] warfarin (COUMADIN) 5 MG tablet Take one and one-half tablet daily except 1 tablet on Tuesdays and Saturdays or as directed  45 tablet  3   No current facility-administered medications for this visit.    Past Medical History  Diagnosis Date  . Arteriosclerotic cardiovascular disease (ASCVD) 2002    Inf STEMI-2002. 2003-cutting balloon + brachytherapy for restenosis; subsequent acute stent thrombosis 06/2010 requiring 2 separate interventions (Zeta stent, then repeat cath with thrombectomy). focal basal inf AK, nl EF; 03/2011: Patent stents, minor nonobst  residual dz, nl EF; neg stress nuclear in 2008 and stress echo in 2009  . Pulmonary embolism 2006    Associated with deep vein thrombosis-2006; + factor V Leiden  . Factor V Leiden, prothrombin gene mutation 2006  .  Hyperlipidemia   . COPD (chronic obstructive pulmonary disease)     02 dependent  . Tobacco abuse     50 pack years  . Hypothyroidism   . Noncompliance   . Pelvic fracture 2009  . Chronic anticoagulation     Warfarin plus ticagrelor  . Acute MI     x4, code blue x3  . Chronic respiratory failure 08/27/2013    On 2L 02    Past Surgical History  Procedure Laterality Date  . Coronary angioplasty  2002, 2003, 2012  . Colonoscopy  Approximately 2000    Negative screening study    History   Social History  . Marital Status: Divorced    Spouse Name: N/A    Number of Children: N/A  .  Years of Education: N/A   Occupational History  . Disabled   . Truck driver    Social History Main Topics  . Smoking status: Former Smoker -- 1.00 packs/day for 50 years    Types: Cigarettes    Quit date: 03/01/2013  . Smokeless tobacco: Never Used     Comment: has not had a cig in 4 months as of 02-27-13  . Alcohol Use: No  . Drug Use: No  . Sexual Activity: Not on file   Other Topics Concern  . Not on file   Social History Narrative  . No narrative on file     Filed Vitals:   10/29/13 1104  BP: 130/78  Pulse: 95  Height: 5\' 6"  (1.676 m)  Weight: 192 lb (87.091 kg)    PHYSICAL EXAM General: NAD HEENT: Normal. Neck: No JVD, no thyromegaly. Lungs: Prolonged expiratory phase with faint inspiratory and expiratory wheezes, no rales. CV: Nondisplaced PMI.  Regular rate and rhythm, normal S1/S2, no S3/S4, no murmur. No pretibial or periankle edema.  No carotid bruit.  Normal pedal pulses.  Abdomen: Soft, nontender, no hepatosplenomegaly, no distention.  Neurologic: Alert and oriented x 3.  Psych: Normal affect. Skin: Normal. Musculoskeletal: Normal range of motion, no gross deformities. Extremities: No clubbing or cyanosis.   ECG: Most recent ECG reviewed.      ASSESSMENT AND PLAN: 1. CAD: Stable ischemic heart disease. Chronic chest pains likely due to pulmonary disease from COPD and a history of pulmonary embolism. Continue Brilinta and rosuvastatin at present doses.  2. Essential HTN: Controlled on present therapy. No changes. 3. Hyperlipidemia: On Crestor 40 mg daily. No changes. 4. Factor V Leiden mutation: Given her history of pulmonary embolism and genetic mutation, she requires lifelong therapy with warfarin.  5. Carotid bruit: Less than 50% stenosis bilaterally in November 2013. Continue statin therapy.   Dispo: f/u 6 months.  Kate Sable, M.D., F.A.C.C.

## 2013-11-03 ENCOUNTER — Encounter: Payer: Self-pay | Admitting: Pulmonary Disease

## 2013-11-03 ENCOUNTER — Ambulatory Visit (INDEPENDENT_AMBULATORY_CARE_PROVIDER_SITE_OTHER): Payer: Medicare Other | Admitting: Pulmonary Disease

## 2013-11-03 VITALS — BP 110/56 | HR 90 | Ht 66.0 in | Wt 192.6 lb

## 2013-11-03 DIAGNOSIS — Z720 Tobacco use: Secondary | ICD-10-CM

## 2013-11-03 DIAGNOSIS — I2510 Atherosclerotic heart disease of native coronary artery without angina pectoris: Secondary | ICD-10-CM

## 2013-11-03 DIAGNOSIS — F17200 Nicotine dependence, unspecified, uncomplicated: Secondary | ICD-10-CM

## 2013-11-03 DIAGNOSIS — J438 Other emphysema: Secondary | ICD-10-CM

## 2013-11-03 DIAGNOSIS — J209 Acute bronchitis, unspecified: Secondary | ICD-10-CM | POA: Insufficient documentation

## 2013-11-03 MED ORDER — LEVOFLOXACIN 750 MG PO TABS: 750.0000 mg | ORAL_TABLET | Freq: Every day | ORAL | Status: DC

## 2013-11-03 MED ORDER — IPRATROPIUM-ALBUTEROL 0.5-2.5 (3) MG/3ML IN SOLN: RESPIRATORY_TRACT | Status: DC

## 2013-11-03 MED ORDER — BUDESONIDE 0.5 MG/2ML IN SUSP: 0.5000 mg | Freq: Two times a day (BID) | RESPIRATORY_TRACT | Status: DC

## 2013-11-03 NOTE — Assessment & Plan Note (Signed)
The patient is coughing up purulent mucus and has chest congestion, and we'll therefore treat her with a course of antibiotics. I have stressed to her again the importance of total smoking cessation.

## 2013-11-03 NOTE — Progress Notes (Signed)
   Subjective:    Patient ID: [name], female    DOB: [birth year], 63 y.o.   MRN: [id number]  HPI The patient comes in today for follow-up of her known COPD. Unfortunately, she continues to smoke, and was recently in the hospital with pneumonia by her history. He is still congested and coughing up purulent mucus, but I told her that could be from her ongoing smoking. She is wearing oxygen at night, but feels that her inhaled medications may not be getting down into her lungs. We have discussed going on nebulized bronchodilators in the past.   Review of Systems  Constitutional: Negative for fever and unexpected weight change.  HENT: Positive for congestion. Negative for dental problem, ear pain, nosebleeds, postnasal drip, rhinorrhea, sinus pressure, sneezing, sore throat and trouble swallowing.   Eyes: Negative for redness and itching.  Respiratory: Positive for cough, chest tightness, shortness of breath and wheezing.   Cardiovascular: Negative for palpitations and leg swelling.  Gastrointestinal: Negative for nausea and vomiting.  Genitourinary: Negative for dysuria.  Musculoskeletal: Negative for joint swelling.  Skin: Negative for rash.  Neurological: Negative for headaches.  Hematological: Does not bruise/bleed easily.  Psychiatric/Behavioral: Negative for dysphoric mood. The patient is not nervous/anxious.        Objective:   Physical Exam Obese female in no acute distress Nose without purulence or discharge noted Neck without lymphadenopathy or thyromegaly Chest with rhonchi but adequate airflow Cardiac exam with regular rate and rhythm Extremities with mild edema, no cyanosis. Venous stasis changes noted Alert and oriented, moves all 4 extremities.       Assessment & Plan:

## 2013-11-03 NOTE — Assessment & Plan Note (Signed)
The patient has severe disease and continues to smoke. I have had a long discussion with her about this, and she understands there is very little we can do from a medication standpoint because of her smoking. Can try her on nebulizers since she feels she is not getting adequate deposition with her current inhalers.

## 2013-11-03 NOTE — Patient Instructions (Signed)
Stop advair and spiriva Start on duonebs by nebulizer at breakfast, lunch, dinner, and bedtime everyday.  Can take 2 additional treatments if having a bad day. Add budesonide 0.5mg  to the treatment each day at breakfast and dinner. Carry albuterol rescue inhaler with you if you leave the house. Will check oxygen level today with walking to see if you qualify for portable oxygen. You need to stop smoking 100% Will treat with levaquin 750mg  one a day for 7 days.  followup with me again in 71mos

## 2013-11-04 ENCOUNTER — Telehealth: Payer: Self-pay | Admitting: Pulmonary Disease

## 2013-11-04 NOTE — Telephone Encounter (Deleted)
[place] are you okay with this change?

## 2013-11-04 NOTE — Telephone Encounter (Signed)
Spoke with Leafy Ro at [facility] states that the patient can not afford $60 copay for Pulmicort and wanted to know if okay to just use Duoneb. Pt really should be on both---I asked Leafy Ro what they could do at [facility] to help patient with cost---she will do a financial hardship for patient to help with the $60 copay. Leafy Ro is aware that I will send Rx's in the morning to the fax number she requested as I will need to have [place] sign the Rx's.

## 2013-11-05 MED ORDER — IPRATROPIUM-ALBUTEROL 0.5-2.5 (3) MG/3ML IN SOLN: RESPIRATORY_TRACT | Status: DC

## 2013-11-05 MED ORDER — BUDESONIDE 0.5 MG/2ML IN SUSP: 0.5000 mg | Freq: Two times a day (BID) | RESPIRATORY_TRACT | Status: DC

## 2013-11-05 NOTE — Telephone Encounter (Signed)
Rx's for Budesonide and Duoneb printed, signed by [facility] and faxed to [facility] [phone] (ATTN: Mandy) Nothing further needed.

## 2013-11-24 ENCOUNTER — Ambulatory Visit (INDEPENDENT_AMBULATORY_CARE_PROVIDER_SITE_OTHER): Payer: Medicare Other | Admitting: *Deleted

## 2013-11-24 DIAGNOSIS — Z5181 Encounter for therapeutic drug level monitoring: Secondary | ICD-10-CM

## 2013-11-24 DIAGNOSIS — Z7901 Long term (current) use of anticoagulants: Secondary | ICD-10-CM | POA: Diagnosis not present

## 2013-11-24 DIAGNOSIS — D6851 Activated protein C resistance: Secondary | ICD-10-CM

## 2013-11-24 DIAGNOSIS — I2699 Other pulmonary embolism without acute cor pulmonale: Secondary | ICD-10-CM | POA: Diagnosis not present

## 2013-11-24 LAB — POCT INR: INR: 4.4

## 2013-12-03 ENCOUNTER — Ambulatory Visit (INDEPENDENT_AMBULATORY_CARE_PROVIDER_SITE_OTHER): Payer: Medicare Other | Admitting: *Deleted

## 2013-12-03 DIAGNOSIS — D6851 Activated protein C resistance: Secondary | ICD-10-CM

## 2013-12-03 DIAGNOSIS — Z5181 Encounter for therapeutic drug level monitoring: Secondary | ICD-10-CM | POA: Diagnosis not present

## 2013-12-03 DIAGNOSIS — Z7901 Long term (current) use of anticoagulants: Secondary | ICD-10-CM | POA: Diagnosis not present

## 2013-12-03 DIAGNOSIS — I2699 Other pulmonary embolism without acute cor pulmonale: Secondary | ICD-10-CM | POA: Diagnosis not present

## 2013-12-03 LAB — POCT INR: INR: 1.9

## 2013-12-17 ENCOUNTER — Telehealth: Payer: Self-pay | Admitting: Adult Health

## 2013-12-17 ENCOUNTER — Ambulatory Visit (INDEPENDENT_AMBULATORY_CARE_PROVIDER_SITE_OTHER): Payer: Medicare Other | Admitting: *Deleted

## 2013-12-17 DIAGNOSIS — Z7901 Long term (current) use of anticoagulants: Secondary | ICD-10-CM | POA: Diagnosis not present

## 2013-12-17 DIAGNOSIS — Z5181 Encounter for therapeutic drug level monitoring: Secondary | ICD-10-CM

## 2013-12-17 DIAGNOSIS — D6851 Activated protein C resistance: Secondary | ICD-10-CM | POA: Diagnosis not present

## 2013-12-17 DIAGNOSIS — I2699 Other pulmonary embolism without acute cor pulmonale: Secondary | ICD-10-CM | POA: Diagnosis not present

## 2013-12-17 LAB — POCT INR: INR: 2.8

## 2013-12-17 MED ORDER — WARFARIN SODIUM 1 MG PO TABS: ORAL_TABLET | ORAL | Status: DC

## 2013-12-17 NOTE — Telephone Encounter (Signed)
Error

## 2013-12-18 ENCOUNTER — Encounter (HOSPITAL_COMMUNITY): Payer: Self-pay | Admitting: Cardiovascular Disease

## 2013-12-31 ENCOUNTER — Ambulatory Visit (INDEPENDENT_AMBULATORY_CARE_PROVIDER_SITE_OTHER): Payer: Medicare Other | Admitting: *Deleted

## 2013-12-31 DIAGNOSIS — I2699 Other pulmonary embolism without acute cor pulmonale: Secondary | ICD-10-CM | POA: Diagnosis not present

## 2013-12-31 DIAGNOSIS — Z5181 Encounter for therapeutic drug level monitoring: Secondary | ICD-10-CM

## 2013-12-31 DIAGNOSIS — Z7901 Long term (current) use of anticoagulants: Secondary | ICD-10-CM

## 2013-12-31 DIAGNOSIS — D6851 Activated protein C resistance: Secondary | ICD-10-CM | POA: Diagnosis not present

## 2013-12-31 LAB — POCT INR: INR: 2.4

## 2014-01-15 ENCOUNTER — Telehealth: Payer: Self-pay | Admitting: *Deleted

## 2014-01-15 MED ORDER — WARFARIN SODIUM 5 MG PO TABS: ORAL_TABLET | ORAL | Status: DC

## 2014-01-15 NOTE — Telephone Encounter (Signed)
WARFARIN 5 MG NEEDS CALLED IN TO [facility] IN [place]

## 2014-01-21 ENCOUNTER — Ambulatory Visit (INDEPENDENT_AMBULATORY_CARE_PROVIDER_SITE_OTHER): Payer: Medicare Other | Admitting: *Deleted

## 2014-01-21 DIAGNOSIS — Z5181 Encounter for therapeutic drug level monitoring: Secondary | ICD-10-CM

## 2014-01-21 DIAGNOSIS — D6851 Activated protein C resistance: Secondary | ICD-10-CM

## 2014-01-21 DIAGNOSIS — I2699 Other pulmonary embolism without acute cor pulmonale: Secondary | ICD-10-CM

## 2014-01-21 DIAGNOSIS — Z7901 Long term (current) use of anticoagulants: Secondary | ICD-10-CM

## 2014-01-21 LAB — POCT INR: INR: 2.5

## 2014-02-04 ENCOUNTER — Ambulatory Visit (INDEPENDENT_AMBULATORY_CARE_PROVIDER_SITE_OTHER): Payer: Medicare Other | Admitting: *Deleted

## 2014-02-04 DIAGNOSIS — Z5181 Encounter for therapeutic drug level monitoring: Secondary | ICD-10-CM | POA: Diagnosis not present

## 2014-02-04 DIAGNOSIS — I2699 Other pulmonary embolism without acute cor pulmonale: Secondary | ICD-10-CM | POA: Diagnosis not present

## 2014-02-04 DIAGNOSIS — Z7901 Long term (current) use of anticoagulants: Secondary | ICD-10-CM

## 2014-02-04 DIAGNOSIS — D6851 Activated protein C resistance: Secondary | ICD-10-CM

## 2014-02-04 LAB — POCT INR: INR: 1.9

## 2014-02-25 ENCOUNTER — Ambulatory Visit (INDEPENDENT_AMBULATORY_CARE_PROVIDER_SITE_OTHER): Payer: Medicare Other | Admitting: *Deleted

## 2014-02-25 DIAGNOSIS — I2699 Other pulmonary embolism without acute cor pulmonale: Secondary | ICD-10-CM

## 2014-02-25 DIAGNOSIS — D6851 Activated protein C resistance: Secondary | ICD-10-CM | POA: Diagnosis not present

## 2014-02-25 DIAGNOSIS — Z7901 Long term (current) use of anticoagulants: Secondary | ICD-10-CM | POA: Diagnosis not present

## 2014-02-25 DIAGNOSIS — Z5181 Encounter for therapeutic drug level monitoring: Secondary | ICD-10-CM | POA: Diagnosis not present

## 2014-02-25 LAB — POCT INR: INR: 2.2

## 2014-02-25 MED ORDER — WARFARIN SODIUM 1 MG PO TABS: ORAL_TABLET | ORAL | Status: DC

## 2014-03-25 ENCOUNTER — Other Ambulatory Visit: Payer: Self-pay | Admitting: Internal Medicine

## 2014-03-25 ENCOUNTER — Ambulatory Visit (INDEPENDENT_AMBULATORY_CARE_PROVIDER_SITE_OTHER): Payer: Medicare Other | Admitting: *Deleted

## 2014-03-25 DIAGNOSIS — I2699 Other pulmonary embolism without acute cor pulmonale: Secondary | ICD-10-CM | POA: Diagnosis not present

## 2014-03-25 DIAGNOSIS — Z5181 Encounter for therapeutic drug level monitoring: Secondary | ICD-10-CM

## 2014-03-25 DIAGNOSIS — D6851 Activated protein C resistance: Secondary | ICD-10-CM | POA: Diagnosis not present

## 2014-03-25 DIAGNOSIS — Z7901 Long term (current) use of anticoagulants: Secondary | ICD-10-CM | POA: Diagnosis not present

## 2014-03-25 LAB — POCT INR: INR: 1.9

## 2014-03-26 ENCOUNTER — Telehealth: Payer: Self-pay | Admitting: Pulmonary Disease

## 2014-03-26 MED ORDER — ALBUTEROL SULFATE HFA 108 (90 BASE) MCG/ACT IN AERS: 2.0000 | INHALATION_SPRAY | RESPIRATORY_TRACT | Status: DC | PRN

## 2014-03-26 NOTE — Telephone Encounter (Signed)
Spoke with the pt  She states that she is needing refill on ventolin inhaler  She states Vladimir Faster told her that they sent [place] a request and it was denied I do not even see that we got a request and pt is aware of this and rx was refilled Nothing further needed

## 2014-03-30 ENCOUNTER — Encounter: Payer: Self-pay | Admitting: Internal Medicine

## 2014-03-30 ENCOUNTER — Ambulatory Visit (INDEPENDENT_AMBULATORY_CARE_PROVIDER_SITE_OTHER): Payer: Medicare Other | Admitting: Internal Medicine

## 2014-03-30 VITALS — BP 140/72 | HR 112 | Temp 98.2°F | Ht 66.0 in | Wt 193.0 lb

## 2014-03-30 DIAGNOSIS — Z23 Encounter for immunization: Secondary | ICD-10-CM | POA: Diagnosis not present

## 2014-03-30 DIAGNOSIS — J449 Chronic obstructive pulmonary disease, unspecified: Secondary | ICD-10-CM

## 2014-03-30 DIAGNOSIS — E785 Hyperlipidemia, unspecified: Secondary | ICD-10-CM

## 2014-03-30 DIAGNOSIS — Z7901 Long term (current) use of anticoagulants: Secondary | ICD-10-CM | POA: Diagnosis not present

## 2014-03-30 DIAGNOSIS — E039 Hypothyroidism, unspecified: Secondary | ICD-10-CM

## 2014-03-30 LAB — LIPID PANEL
CHOL/HDL RATIO: 6
Cholesterol: 246 mg/dL — ABNORMAL HIGH (ref 0–200)
HDL: 39.8 mg/dL (ref >39.00)
LDL CALC: 182 mg/dL — AB (ref 0–99)
NONHDL: 206.2
Triglycerides: 121 mg/dL (ref 0.0–149.0)
VLDL: 24.2 mg/dL (ref 0.0–40.0)

## 2014-03-30 LAB — BASIC METABOLIC PANEL
BUN: 20 mg/dL (ref 6–23)
CALCIUM: 9.3 mg/dL (ref 8.4–10.5)
CO2: 31 mEq/L (ref 19–32)
Chloride: 104 mEq/L (ref 96–112)
Creatinine, Ser: 0.94 mg/dL (ref 0.40–1.20)
GFR: 63.74 mL/min (ref >60.00)
GLUCOSE: 90 mg/dL (ref 70–99)
Potassium: 4.3 mEq/L (ref 3.5–5.1)
Sodium: 139 mEq/L (ref 135–145)

## 2014-03-30 LAB — CBC WITH DIFFERENTIAL/PLATELET
Basophils Absolute: 0.1 10*3/uL (ref 0.0–0.1)
Basophils Relative: 0.8 % (ref 0.0–3.0)
Eosinophils Absolute: 0.1 10*3/uL (ref 0.0–0.7)
Eosinophils Relative: 1.6 % (ref 0.0–5.0)
HCT: 46.6 % — ABNORMAL HIGH (ref 36.0–46.0)
HEMOGLOBIN: 15.6 g/dL — AB (ref 12.0–15.0)
Lymphocytes Relative: 21.3 % (ref 12.0–46.0)
Lymphs Abs: 2 10*3/uL (ref 0.7–4.0)
MCHC: 33.6 g/dL (ref 30.0–36.0)
MCV: 88.6 fl (ref 78.0–100.0)
MONOS PCT: 7.4 % (ref 3.0–12.0)
Monocytes Absolute: 0.7 10*3/uL (ref 0.1–1.0)
NEUTROS ABS: 6.5 10*3/uL (ref 1.4–7.7)
Neutrophils Relative %: 68.9 % (ref 43.0–77.0)
Platelets: 322 10*3/uL (ref 150.0–400.0)
RBC: 5.26 Mil/uL — ABNORMAL HIGH (ref 3.87–5.11)
RDW: 15.1 % (ref 11.5–15.5)
WBC: 9.4 10*3/uL (ref 4.0–10.5)

## 2014-03-30 LAB — HEPATIC FUNCTION PANEL
ALBUMIN: 3.8 g/dL (ref 3.5–5.2)
ALK PHOS: 113 U/L (ref 39–117)
ALT: 17 U/L (ref 0–35)
AST: 17 U/L (ref 0–37)
BILIRUBIN TOTAL: 0.4 mg/dL (ref 0.2–1.2)
Bilirubin, Direct: 0 mg/dL (ref 0.0–0.3)
Total Protein: 7.3 g/dL (ref 6.0–8.3)

## 2014-03-30 LAB — TSH: TSH: 0.93 u[IU]/mL (ref 0.35–4.50)

## 2014-03-30 MED ORDER — ROSUVASTATIN CALCIUM 40 MG PO TABS: 40.0000 mg | ORAL_TABLET | Freq: Every day | ORAL | Status: DC

## 2014-03-30 MED ORDER — LEVOTHYROXINE SODIUM 125 MCG PO TABS: ORAL_TABLET | ORAL | Status: DC

## 2014-03-30 NOTE — Patient Instructions (Signed)
Our office will contact you re: thyroid blood test results and whether your thyroid medication needs to be adjusted. Use nicotine lozenges 2 mg as directed to help you quit smoking.

## 2014-03-30 NOTE — Assessment & Plan Note (Signed)
Monitor TFTs.  Adjust levothyroxine dose accordingly.

## 2014-03-30 NOTE — Progress Notes (Signed)
Pre visit review using our clinic review tool, if applicable. No additional management support is needed unless otherwise documented below in the visit note. 

## 2014-03-30 NOTE — Assessment & Plan Note (Signed)
Monitor lipid panel and LFTs.  Take Crestor on a regular basis.

## 2014-03-30 NOTE — Progress Notes (Signed)
Subjective:    Patient ID: [name], female    DOB: 1950-03-05, 64 y.o.   MRN: [id number]  HPI  64 year old white female with history of coronary artery disease, pulmonary embolism on anticoagulation and COPD for follow-up.  Patient due for follow-up thyroid studies. She denies any weight loss. Her heart rate has been elevated since her pulmonologist changed her nebulizer medication.  Her COPD is getting worse. She is still smoking despite counseling from pulmonary.  Hyperlipidemia - she is due for lipid panel and LFTs  Review of Systems Shortness of breath,  Smoking 1/2 ppd    Past Medical History  Diagnosis Date  . Arteriosclerotic cardiovascular disease (ASCVD) 2002    Inf STEMI-2002. 2003-cutting balloon + brachytherapy for restenosis; subsequent acute stent thrombosis 06/2010 requiring 2 separate interventions (Zeta stent, then repeat cath with thrombectomy). focal basal inf AK, nl EF; 03/2011: Patent stents, minor nonobst  residual dz, nl EF; neg stress nuclear in 2008 and stress echo in 2009  . Pulmonary embolism 2006    Associated with deep vein thrombosis-2006; + factor V Leiden  . Factor V Leiden, prothrombin gene mutation 2006  . Hyperlipidemia   . COPD (chronic obstructive pulmonary disease)     02 dependent  . Tobacco abuse     50 pack years  . Hypothyroidism   . Noncompliance   . Pelvic fracture 2009  . Chronic anticoagulation     Warfarin plus ticagrelor  . Acute MI     x4, code blue x3  . Chronic respiratory failure 08/27/2013    On 2L 02    History   Social History  . Marital Status: Divorced    Spouse Name: N/A  . Number of Children: N/A  . Years of Education: N/A   Occupational History  . Disabled   . Truck driver    Social History Main Topics  . Smoking status: Former Smoker -- 1.00 packs/day for 50 years    Types: Cigarettes    Quit date: 03/01/2013  . Smokeless tobacco: Never Used     Comment: has not had a cig in 4 months as of  02-27-13  . Alcohol Use: No  . Drug Use: No  . Sexual Activity: Not on file   Other Topics Concern  . Not on file   Social History Narrative    Past Surgical History  Procedure Laterality Date  . Coronary angioplasty  2002, 2003, 2012  . Colonoscopy  Approximately 2000    Negative screening study  . Left and right heart catheterization with coronary angiogram N/A 04/03/2011    Procedure: LEFT AND RIGHT HEART CATHETERIZATION WITH CORONARY ANGIOGRAM;  Surgeon: Sherren Mocha, MD;  Location: [facility] CATH LAB;  Service: Cardiovascular;  Laterality: N/A;    Family History  Problem Relation Age of Onset  . Emphysema Mother   . Emphysema Sister   . Heart disease Father   . Factor V Leiden deficiency Father   . Factor V Leiden deficiency Sister   . Factor V Leiden deficiency Daughter   . Kidney cancer Paternal Grandmother     Allergies  Allergen Reactions  . Dilaudid [Hydromorphone Hcl] Hives and Nausea Only  . Minocycline Hcl     REACTION: Dizzy  . Prednisone     REACTION: feels like throat swelling  . Varenicline Tartrate     REACTION: Dizzy(chantix)   . Zocor [Simvastatin - High Dose] Other (See Comments)    myalgia    Current Outpatient Prescriptions on File  Prior to Visit  Medication Sig Dispense Refill  . albuterol (PROVENTIL HFA;VENTOLIN HFA) 108 (90 BASE) MCG/ACT inhaler Inhale 2 puffs into the lungs every 4 (four) hours as needed for wheezing or shortness of breath. 1 Inhaler 1  . budesonide (PULMICORT) 0.5 MG/2ML nebulizer solution Take 2 mLs (0.5 mg total) by nebulization 2 (two) times daily. Dx: COPD J44.9 120 mL 6  . fentaNYL (DURAGESIC - DOSED MCG/HR) 25 MCG/HR patch Place 25 mcg onto the skin every 3 (three) days.    . fish oil-omega-3 fatty acids 1000 MG capsule Take 2 g by mouth daily.      [name] ipratropium-albuterol (DUONEB) 0.5-2.5 (3) MG/3ML SOLN 1 vial via nebulizer at breakfast, lunch, dinner and bedtime daily. May take 2 additional treatments on bad day Dx:  COPD J44.9 360 mL 3  . levothyroxine (SYNTHROID, LEVOTHROID) 125 MCG tablet TAKE ONE TABLET BY MOUTH ONCE DAILY 90 tablet 1  . nitroGLYCERIN (NITROSTAT) 0.4 MG SL tablet Place 1 tablet (0.4 mg total) under the tongue every 5 (five) minutes as needed for chest pain. 25 tablet 3  . rosuvastatin (CRESTOR) 40 MG tablet Take 40 mg by mouth daily.    . ticagrelor (BRILINTA) 90 MG TABS tablet Take 90 mg by mouth 2 (two) times daily.    [name] warfarin (COUMADIN) 1 MG tablet Take 1 tablet (1mg ) along with 1 tablet (5mg ) daily except on Fridays.  On Fridays take only 5mg  tablet. 45 tablet 3  . warfarin (COUMADIN) 5 MG tablet Take 1 tablet daily or as directed 30 tablet 6   No current facility-administered medications on file prior to visit.    BP 140/72 mmHg  Pulse 112  Temp(Src) 98.2 F (36.8 C) (Oral)  Ht 5\' 6"  (1.676 m)  Wt 193 lb (87.544 kg)  BMI 31.17 kg/m2    Objective:   Physical Exam  Constitutional: She is oriented to person, place, and time. She appears well-developed and well-nourished.  HENT:  Head: Normocephalic and atraumatic.  Cardiovascular: Normal rate, regular rhythm and normal heart sounds.   No murmur heard. Pulmonary/Chest: Effort normal.  Prolonged expiration, decreased breath sounds throughout  Neurological: She is alert and oriented to person, place, and time. No cranial nerve deficit.  Skin: Skin is warm and dry.  Psychiatric: She has a normal mood and affect.          Assessment & Plan:

## 2014-03-30 NOTE — Assessment & Plan Note (Signed)
Smoking cessation strongly encouraged.  Patient updated with Prevnar vaccine.

## 2014-04-15 ENCOUNTER — Ambulatory Visit (INDEPENDENT_AMBULATORY_CARE_PROVIDER_SITE_OTHER): Payer: Medicare Other | Admitting: *Deleted

## 2014-04-15 DIAGNOSIS — Z5181 Encounter for therapeutic drug level monitoring: Secondary | ICD-10-CM

## 2014-04-15 DIAGNOSIS — D6851 Activated protein C resistance: Secondary | ICD-10-CM | POA: Diagnosis not present

## 2014-04-15 DIAGNOSIS — I2699 Other pulmonary embolism without acute cor pulmonale: Secondary | ICD-10-CM | POA: Diagnosis not present

## 2014-04-15 DIAGNOSIS — Z7901 Long term (current) use of anticoagulants: Secondary | ICD-10-CM

## 2014-04-15 LAB — POCT INR: INR: 2.4

## 2014-04-17 ENCOUNTER — Telehealth: Payer: Self-pay | Admitting: Pulmonary Disease

## 2014-04-17 NOTE — Telephone Encounter (Signed)
lmomtcb x1 for pt Which pharm does she want this called into?

## 2014-04-17 NOTE — Telephone Encounter (Signed)
Go with proair HFA

## 2014-04-17 NOTE — Telephone Encounter (Signed)
Spoke with pt. Received a letter from her insurance company that they will no longer cover Proventil or Ventolin.  Called her insurance company. ProAir HFA and ProAir Respiclick are covered alternatives.  [facility] - please advise. Thanks.

## 2014-04-20 NOTE — Telephone Encounter (Signed)
lmtcb x2 for pt. 

## 2014-04-21 NOTE — Telephone Encounter (Signed)
lmtcb X3 for pt-the number listed below as the "pharmacy" is a PA line for pt's insurance.

## 2014-04-22 MED ORDER — ALBUTEROL SULFATE HFA 108 (90 BASE) MCG/ACT IN AERS: 2.0000 | INHALATION_SPRAY | Freq: Four times a day (QID) | RESPIRATORY_TRACT | Status: DC | PRN

## 2014-04-22 NOTE — Telephone Encounter (Signed)
Called pt and left detailed msg explaining med change and rx for proair hfa sent to [facility]

## 2014-05-05 ENCOUNTER — Ambulatory Visit (INDEPENDENT_AMBULATORY_CARE_PROVIDER_SITE_OTHER): Payer: Medicare Other | Admitting: Pulmonary Disease

## 2014-05-05 ENCOUNTER — Encounter: Payer: Self-pay | Admitting: Pulmonary Disease

## 2014-05-05 VITALS — BP 116/62 | HR 87 | Temp 98.3°F | Ht 66.0 in | Wt 195.6 lb

## 2014-05-05 DIAGNOSIS — J438 Other emphysema: Secondary | ICD-10-CM | POA: Diagnosis not present

## 2014-05-05 MED ORDER — ALBUTEROL SULFATE HFA 108 (90 BASE) MCG/ACT IN AERS: 2.0000 | INHALATION_SPRAY | RESPIRATORY_TRACT | Status: DC | PRN

## 2014-05-05 NOTE — Progress Notes (Signed)
   Subjective:    Patient ID: [name], female    DOB: 11/26/1950, 63 y.o.   MRN: [id number]  HPI The patient comes in today for all up of her known severe COPD. She has done fairly well since her last visit, and tells me that she has stopped smoking. She thinks that her nebulizer treatments her making her shaky and causing headaches, but she is using her albuterol rescue inhaler 4-5 times a day on top of this.  She denies any significant chest congestion or purulent mucus. She feels her breathing is not as good since the higher temperatures have arrived.   Review of Systems  Constitutional: Negative for fever and unexpected weight change.  HENT: Positive for congestion and postnasal drip. Negative for dental problem, ear pain, nosebleeds, rhinorrhea, sinus pressure, sneezing, sore throat and trouble swallowing.   Eyes: Negative for redness and itching.  Respiratory: Positive for cough, chest tightness, shortness of breath and wheezing.   Cardiovascular: Negative for palpitations and leg swelling.  Gastrointestinal: Negative for nausea and vomiting.  Genitourinary: Negative for dysuria.  Musculoskeletal: Negative for joint swelling.  Skin: Negative for rash.  Neurological: Negative for headaches.  Hematological: Does not bruise/bleed easily.  Psychiatric/Behavioral: Negative for dysphoric mood. The patient is not nervous/anxious.        Objective:   Physical Exam Obese female in no acute distress Nose without purulence or discharge noted Neck without lymphadenopathy or thyromegaly Chest with decreased breath sounds, no active wheezing  Cardiac exam with regular rate and rhythm Lower extremities with 1+ edema, no cyanosis Alert and oriented, moves all 4 extremities.       Assessment & Plan:

## 2014-05-05 NOTE — Patient Instructions (Signed)
Will continue on neb treatments for now, but try to avoid using your rescue albuterol so often.  If you get short of breath, sit down for 5 min, and if improves, you do not need your rescue medication.  Continue to stay off cigarettes.  Work on staying active and weight loss. followup again in 15mos, but call if you are not doing well.

## 2014-05-05 NOTE — Assessment & Plan Note (Signed)
The patient continues to have ongoing issues with dyspnea on exertion, and I have explained this is not surprising given the severity of her lung disease, as well as her obesity and deconditioning. She felt that her inhalers were not working for her, and we therefore changed her over to nebulizer treatments. I suspect she is getting the shakiness and headaches from overusing her rescue medication on top of her albuterol nebulizer treatments. I have asked her not to use her rescue inhaler Intal she has settled down for at least 5 minutes to see if her breathing normalizes. She tells me that she is not smoking, and I hope things improve going forward. Perhaps we can go back to inhaled maintenance medications once she has been off cigarettes for a longer period of time. I have asked her again about pulmonary rehabilitation, and she is unable to afford.

## 2014-05-11 ENCOUNTER — Encounter: Payer: Self-pay | Admitting: Cardiovascular Disease

## 2014-05-11 ENCOUNTER — Ambulatory Visit (INDEPENDENT_AMBULATORY_CARE_PROVIDER_SITE_OTHER): Payer: Medicare Other | Admitting: Cardiovascular Disease

## 2014-05-11 ENCOUNTER — Ambulatory Visit (INDEPENDENT_AMBULATORY_CARE_PROVIDER_SITE_OTHER): Payer: Medicare Other | Admitting: *Deleted

## 2014-05-11 VITALS — BP 136/68 | HR 104 | Ht 66.0 in | Wt 194.0 lb

## 2014-05-11 DIAGNOSIS — I2510 Atherosclerotic heart disease of native coronary artery without angina pectoris: Secondary | ICD-10-CM | POA: Diagnosis not present

## 2014-05-11 DIAGNOSIS — I2699 Other pulmonary embolism without acute cor pulmonale: Secondary | ICD-10-CM

## 2014-05-11 DIAGNOSIS — Z7901 Long term (current) use of anticoagulants: Secondary | ICD-10-CM

## 2014-05-11 DIAGNOSIS — Z5181 Encounter for therapeutic drug level monitoring: Secondary | ICD-10-CM

## 2014-05-11 DIAGNOSIS — I10 Essential (primary) hypertension: Secondary | ICD-10-CM

## 2014-05-11 DIAGNOSIS — E785 Hyperlipidemia, unspecified: Secondary | ICD-10-CM

## 2014-05-11 DIAGNOSIS — D688 Other specified coagulation defects: Secondary | ICD-10-CM

## 2014-05-11 DIAGNOSIS — I739 Peripheral vascular disease, unspecified: Secondary | ICD-10-CM

## 2014-05-11 DIAGNOSIS — D6851 Activated protein C resistance: Secondary | ICD-10-CM

## 2014-05-11 DIAGNOSIS — I779 Disorder of arteries and arterioles, unspecified: Secondary | ICD-10-CM

## 2014-05-11 DIAGNOSIS — J438 Other emphysema: Secondary | ICD-10-CM

## 2014-05-11 LAB — POCT INR: INR: 2.6

## 2014-05-11 NOTE — Patient Instructions (Signed)
Your physician wants you to follow-up in: 6 months with Dr.Koneswaran You will receive a reminder letter in the mail two months in advance. If you don't receive a letter, please call our office to schedule the follow-up appointment.    Your physician recommends that you continue on your current medications as directed. Please refer to the Current Medication list given to you today.    Please get FASTING lipids in 3 months      Thank you for choosing [facility] !

## 2014-05-11 NOTE — Progress Notes (Signed)
Patient ID: [name], female   DOB: 1950/09/19, 64 y.o.   MRN: [id number]      SUBJECTIVE: The patient presents for routine cardiovascular follow up. She has a history of coronary artery disease with prior PCI of the RCA and has had both in-stent restenosis as well as in-stent thrombosis. She has a history of pulmonary embolism and factor V Leiden deficiency, and is on chronic warfarin therapy. She is also on Brilinta. She has a history of tobacco abuse, obesity, and severe COPD and follows with pulmonary.  She has remained nicotine free for the past 4 months. She has chronic chest pains which are no worse than usual and are fleeting and last seconds. She has chronic exertional dyspnea and takes nebulizer treatments regularly, which leads to shakiness.   Review of Systems: As per "subjective", otherwise negative.  Allergies  Allergen Reactions  . Dilaudid [Hydromorphone Hcl] Hives and Nausea Only  . Minocycline Hcl     REACTION: Dizzy  . Prednisone     REACTION: feels like throat swelling  . Varenicline Tartrate     REACTION: Dizzy(chantix)   . Zocor [Simvastatin - High Dose] Other (See Comments)    myalgia    Current Outpatient Prescriptions  Medication Sig Dispense Refill  . albuterol (PROVENTIL HFA;VENTOLIN HFA) 108 (90 BASE) MCG/ACT inhaler Inhale 2 puffs into the lungs every 4 (four) hours as needed for wheezing or shortness of breath. 1 Inhaler 3  . budesonide (PULMICORT) 0.5 MG/2ML nebulizer solution Take 2 mLs (0.5 mg total) by nebulization 2 (two) times daily. Dx: COPD J44.9 120 mL 6  . fentaNYL (DURAGESIC - DOSED MCG/HR) 25 MCG/HR patch Place 25 mcg onto the skin every 3 (three) days.    . fish oil-omega-3 fatty acids 1000 MG capsule Take 2 g by mouth daily.      [name] ipratropium-albuterol (DUONEB) 0.5-2.5 (3) MG/3ML SOLN 1 vial via nebulizer at breakfast, lunch, dinner and bedtime daily. May take 2 additional treatments on bad day Dx: COPD J44.9 360 mL 3  .  levothyroxine (SYNTHROID, LEVOTHROID) 125 MCG tablet TAKE ONE TABLET BY MOUTH ONCE DAILY 90 tablet 1  . nitroGLYCERIN (NITROSTAT) 0.4 MG SL tablet Place 1 tablet (0.4 mg total) under the tongue every 5 (five) minutes as needed for chest pain. 25 tablet 3  . rosuvastatin (CRESTOR) 40 MG tablet Take 1 tablet (40 mg total) by mouth daily. 90 tablet 1  . ticagrelor (BRILINTA) 90 MG TABS tablet Take 90 mg by mouth 2 (two) times daily.    [name] warfarin (COUMADIN) 1 MG tablet Take 1 tablet (1mg ) along with 1 tablet (5mg ) daily except on Fridays.  On Fridays take only 5mg  tablet. 45 tablet 3  . warfarin (COUMADIN) 5 MG tablet Take 1 tablet daily or as directed 30 tablet 6   No current facility-administered medications for this visit.    Past Medical History  Diagnosis Date  . Arteriosclerotic cardiovascular disease (ASCVD) 2002    Inf STEMI-2002. 2003-cutting balloon + brachytherapy for restenosis; subsequent acute stent thrombosis 06/2010 requiring 2 separate interventions (Zeta stent, then repeat cath with thrombectomy). focal basal inf AK, nl EF; 03/2011: Patent stents, minor nonobst  residual dz, nl EF; neg stress nuclear in 2008 and stress echo in 2009  . Pulmonary embolism 2006    Associated with deep vein thrombosis-2006; + factor V Leiden  . Factor V Leiden, prothrombin gene mutation 2006  . Hyperlipidemia   . COPD (chronic obstructive pulmonary disease)  02 dependent  . Tobacco abuse     50 pack years  . Hypothyroidism   . Noncompliance   . Pelvic fracture 2009  . Chronic anticoagulation     Warfarin plus ticagrelor  . Acute MI     x4, code blue x3  . Chronic respiratory failure 08/27/2013    On 2L 02    Past Surgical History  Procedure Laterality Date  . Coronary angioplasty  2002, 2003, 2012  . Colonoscopy  Approximately 2000    Negative screening study  . Left and right heart catheterization with coronary angiogram N/A 04/03/2011    Procedure: LEFT AND RIGHT HEART  CATHETERIZATION WITH CORONARY ANGIOGRAM;  Surgeon: Sherren Mocha, MD;  Location: [facility] CATH LAB;  Service: Cardiovascular;  Laterality: N/A;    History   Social History  . Marital Status: Divorced    Spouse Name: N/A  . Number of Children: N/A  . Years of Education: N/A   Occupational History  . Disabled   . Truck driver    Social History Main Topics  . Smoking status: Former Smoker -- 1.00 packs/day for 50 years    Types: Cigarettes    Start date: 05/10/1968    Quit date: 03/01/2013  . Smokeless tobacco: Never Used     Comment: has not had a cig in 4 months as of 02-27-13  . Alcohol Use: No  . Drug Use: No  . Sexual Activity: Not on file   Other Topics Concern  . Not on file   Social History Narrative     Filed Vitals:   05/11/14 1402  BP: 136/68  Pulse: 104  Height: 5\' 6"  (1.676 m)  Weight: 194 lb (87.998 kg)  SpO2: 96%    PHYSICAL EXAM General: NAD HEENT: Normal. Neck: No JVD, no thyromegaly. Lungs: Prolonged expiratory phase with no wheezes nor rales. CV: Nondisplaced PMI. Rate at upper normal limits, regular rhythm, normal S1/S2, no S3/S4, no murmur. No pretibial or periankle edema. Stasis dermatitis b/l.   Abdomen: Soft, nontender, obese. Neurologic: Alert and oriented x 3.  Psych: Normal affect. Skin:  Stasis dermatitis b/l LE's. Musculoskeletal: Normal range of motion, no gross deformities. Extremities: No clubbing or cyanosis.  ECG: Most recent ECG reviewed.      ASSESSMENT AND PLAN: 1. CAD with history of PCI of the RCA and has had both in-stent restenosis as well as in-stent thrombosis: Stable ischemic heart disease from a symptomatic standpoint. Has chronic chest pains likely due to pulmonary disease from COPD and a history of pulmonary embolism. Did not feel Ranexa helped in the past. Continue Brilinta and rosuvastatin at present doses.   2. Essential HTN: Controlled on present therapy. No changes.  3. Hyperlipidemia: On Crestor 40 mg  daily. Lipids on 03/30/14 showed LDL 182. The addition of Zetia is unlikely to reduce LDL significantly. I feel she is a candidate for PCSK-9 inhibitors. Plan to start [place].  4. Factor V Leiden mutation: Given her history of pulmonary embolism and genetic mutation, she requires lifelong therapy with warfarin.   5. Carotid bruit: Less than 50% stenosis bilaterally in November 2013. Continue statin therapy.   Dispo: f/u 6 months.   Kate Sable, M.D., F.A.C.C.

## 2014-06-01 ENCOUNTER — Telehealth: Payer: Self-pay

## 2014-06-01 NOTE — Telephone Encounter (Signed)
Pt will call [facility]. Pt thinks that where she had last mammogram

## 2014-06-01 NOTE — Telephone Encounter (Signed)
Left message for pt to call back concerning need for mammogram 

## 2014-06-10 ENCOUNTER — Ambulatory Visit (INDEPENDENT_AMBULATORY_CARE_PROVIDER_SITE_OTHER): Payer: Medicare Other | Admitting: *Deleted

## 2014-06-10 DIAGNOSIS — Z7901 Long term (current) use of anticoagulants: Secondary | ICD-10-CM

## 2014-06-10 DIAGNOSIS — Z5181 Encounter for therapeutic drug level monitoring: Secondary | ICD-10-CM

## 2014-06-10 DIAGNOSIS — D6851 Activated protein C resistance: Secondary | ICD-10-CM

## 2014-06-10 DIAGNOSIS — I2699 Other pulmonary embolism without acute cor pulmonale: Secondary | ICD-10-CM

## 2014-06-10 LAB — POCT INR: INR: 2.2

## 2014-06-12 ENCOUNTER — Other Ambulatory Visit: Payer: Self-pay | Admitting: Internal Medicine

## 2014-06-29 ENCOUNTER — Telehealth: Payer: Self-pay

## 2014-06-29 NOTE — Telephone Encounter (Signed)
[facility] cannot reach pt to [facility].They will file her chart at this time.I have left several messages for this patient.

## 2014-06-30 ENCOUNTER — Other Ambulatory Visit: Payer: Self-pay | Admitting: Internal Medicine

## 2014-07-08 ENCOUNTER — Ambulatory Visit (INDEPENDENT_AMBULATORY_CARE_PROVIDER_SITE_OTHER): Payer: Medicare Other | Admitting: *Deleted

## 2014-07-08 DIAGNOSIS — I2699 Other pulmonary embolism without acute cor pulmonale: Secondary | ICD-10-CM | POA: Diagnosis not present

## 2014-07-08 DIAGNOSIS — D6851 Activated protein C resistance: Secondary | ICD-10-CM | POA: Diagnosis not present

## 2014-07-08 DIAGNOSIS — Z5181 Encounter for therapeutic drug level monitoring: Secondary | ICD-10-CM

## 2014-07-08 DIAGNOSIS — Z7901 Long term (current) use of anticoagulants: Secondary | ICD-10-CM | POA: Diagnosis not present

## 2014-07-08 LAB — POCT INR: INR: 2.3

## 2014-07-23 ENCOUNTER — Other Ambulatory Visit: Payer: Self-pay | Admitting: Family Medicine

## 2014-07-23 MED ORDER — LEVOTHYROXINE SODIUM 125 MCG PO TABS: 125.0000 ug | ORAL_TABLET | Freq: Every day | ORAL | Status: DC

## 2014-07-23 NOTE — Telephone Encounter (Signed)
Sent to the pharmacy for two months.  Per Dr Shawna Orleans labs on 03/30/14, pt should return in six months (Sept 2016)  for TSH.

## 2014-08-05 ENCOUNTER — Ambulatory Visit (INDEPENDENT_AMBULATORY_CARE_PROVIDER_SITE_OTHER): Payer: Medicare Other | Admitting: *Deleted

## 2014-08-05 DIAGNOSIS — D6851 Activated protein C resistance: Secondary | ICD-10-CM

## 2014-08-05 DIAGNOSIS — I2699 Other pulmonary embolism without acute cor pulmonale: Secondary | ICD-10-CM | POA: Diagnosis not present

## 2014-08-05 DIAGNOSIS — Z5181 Encounter for therapeutic drug level monitoring: Secondary | ICD-10-CM

## 2014-08-05 DIAGNOSIS — Z7901 Long term (current) use of anticoagulants: Secondary | ICD-10-CM

## 2014-08-05 LAB — POCT INR: INR: 2.6

## 2014-09-02 ENCOUNTER — Telehealth: Payer: Self-pay | Admitting: Internal Medicine

## 2014-09-02 ENCOUNTER — Telehealth: Payer: Self-pay | Admitting: Pulmonary Disease

## 2014-09-02 MED ORDER — LEVOTHYROXINE SODIUM 125 MCG PO TABS: 125.0000 ug | ORAL_TABLET | Freq: Every day | ORAL | Status: DC

## 2014-09-02 MED ORDER — ALBUTEROL SULFATE HFA 108 (90 BASE) MCG/ACT IN AERS
2.0000 | INHALATION_SPRAY | RESPIRATORY_TRACT | Status: DC | PRN
Start: 2014-09-02 — End: 2014-09-21

## 2014-09-02 NOTE — Telephone Encounter (Deleted)
Pt request refill levothyroxine (SYNTHROID, LEVOTHROID) 125 MCG tablet  [facility] in [place]  Pt saw Dr Raliegh Ip in March, but is a yoo pt.  Pt would like to switch to Dr Raliegh Ip, I have sent him a message, however Is out of her med.

## 2014-09-02 NOTE — Telephone Encounter (Signed)
Pt would like to know if you will accept her as a pt? Pt is a yoo pt and states she has seen you in the past. Will you accept her?

## 2014-09-02 NOTE — Telephone Encounter (Signed)
I am not accepting any new patients at this time

## 2014-09-02 NOTE — Telephone Encounter (Signed)
Please advise if ok to refill pt's Albuterol rescue inhaler.  Last filled 05/05/14.  Former pt of Dr Gwenette Greet.  Scheduled to see Dr Ashok Cordia 09/21/14.

## 2014-09-02 NOTE — Telephone Encounter (Signed)
It's ok to refill her rescue inhaler. J.

## 2014-09-02 NOTE — Telephone Encounter (Signed)
Pt advised refill for Proair sent to pharmacy.

## 2014-09-02 NOTE — Telephone Encounter (Signed)
Pt request refill levothyroxine (SYNTHROID, LEVOTHROID) 125 MCG tablet  [facility] in [place]  Pt saw Dr Raliegh Ip in March, but is a yoo pt.  Pt would like to switch to Dr Raliegh Ip, I have sent him a message, however Is out of her med.

## 2014-09-03 ENCOUNTER — Ambulatory Visit (INDEPENDENT_AMBULATORY_CARE_PROVIDER_SITE_OTHER): Payer: Medicare Other | Admitting: *Deleted

## 2014-09-03 DIAGNOSIS — Z5181 Encounter for therapeutic drug level monitoring: Secondary | ICD-10-CM | POA: Diagnosis not present

## 2014-09-03 LAB — POCT INR: INR: 1.9

## 2014-09-16 ENCOUNTER — Ambulatory Visit (INDEPENDENT_AMBULATORY_CARE_PROVIDER_SITE_OTHER): Payer: Medicare Other

## 2014-09-16 DIAGNOSIS — Z23 Encounter for immunization: Secondary | ICD-10-CM

## 2014-09-21 ENCOUNTER — Encounter: Payer: Self-pay | Admitting: Pulmonary Disease

## 2014-09-21 ENCOUNTER — Ambulatory Visit (INDEPENDENT_AMBULATORY_CARE_PROVIDER_SITE_OTHER): Payer: Medicare Other | Admitting: Pulmonary Disease

## 2014-09-21 ENCOUNTER — Other Ambulatory Visit: Payer: Medicare Other

## 2014-09-21 VITALS — BP 140/76 | HR 92 | Ht 66.0 in | Wt 201.8 lb

## 2014-09-21 DIAGNOSIS — J439 Emphysema, unspecified: Secondary | ICD-10-CM | POA: Diagnosis not present

## 2014-09-21 DIAGNOSIS — G4734 Idiopathic sleep related nonobstructive alveolar hypoventilation: Secondary | ICD-10-CM | POA: Diagnosis not present

## 2014-09-21 DIAGNOSIS — I2510 Atherosclerotic heart disease of native coronary artery without angina pectoris: Secondary | ICD-10-CM | POA: Diagnosis not present

## 2014-09-21 DIAGNOSIS — I2699 Other pulmonary embolism without acute cor pulmonale: Secondary | ICD-10-CM | POA: Diagnosis not present

## 2014-09-21 DIAGNOSIS — K219 Gastro-esophageal reflux disease without esophagitis: Secondary | ICD-10-CM | POA: Diagnosis not present

## 2014-09-21 DIAGNOSIS — Z720 Tobacco use: Secondary | ICD-10-CM

## 2014-09-21 DIAGNOSIS — F17200 Nicotine dependence, unspecified, uncomplicated: Secondary | ICD-10-CM

## 2014-09-21 MED ORDER — BUDESONIDE 0.5 MG/2ML IN SUSP: 0.5000 mg | Freq: Two times a day (BID) | RESPIRATORY_TRACT | Status: DC

## 2014-09-21 MED ORDER — IPRATROPIUM-ALBUTEROL 0.5-2.5 (3) MG/3ML IN SOLN: RESPIRATORY_TRACT | Status: DC

## 2014-09-21 MED ORDER — ALBUTEROL SULFATE HFA 108 (90 BASE) MCG/ACT IN AERS: 2.0000 | INHALATION_SPRAY | RESPIRATORY_TRACT | Status: DC | PRN

## 2014-09-21 NOTE — Patient Instructions (Signed)
1. You will continue using her nebulizer and inhaler medications as prescribed. Please contact my office if you have any new breathing problems. 2. Please continue to try to stop smoking completely before your next appointment. 3. I'm repeating breathing tests and a walking test at her next appointment. 4. I'm checking you for the genetic form of COPD today. 5. I will see you back in 1-2 months but please feel free to call if you have any new breathing problems.

## 2014-09-21 NOTE — Progress Notes (Signed)
Subjective:    Patient ID: [name], female    DOB: 03/22/1950, 64 y.o.   MRN: [id number]  C.C.:  Follow-up for Very Severe COPD w/ Emphysema, PE, GERD, Nocturnal Hypoxia, & Ongoing Tobacco Use.  HPI Very Severe COPD w/ Emphysema:  She is currently using Duonebs qid & Budesonide neb bid. She reports a chronic cough productive of a foamy white mucus. Occasionally it is yellow. Denies any hemoptysis. She also has frequent wheezing. Wakes up at night coughing & wheezing. She uses her rescue inhaler frequently using 1/month. No exacerbations since last appointment.  PE:  Reports she has a genetic predisoposition with Factor V Leiden. Currently on Coumadin. Last PE around 2006. Father also had a history of blood clots. She denies any melena, hematochezia, or hematuria recently. Coumadin managed by cardiologist Dr. Bronson Ing.  GERD:  Denies any reflux or dyspepsia on a regular basis but does have it intermittently. Does have morning brash water taste. Previously has taken Pepcid.   Nocturnal Hypoxia:  She reports she cannot afford the testing for OSA. She uses 2 L/m sleeping at night.   Ongoing Tobacco Use:  Restarted smoking in April due to stress from Dr. Gwenette Greet leaving. She admits she wants to quit smoking. She has been using cinnamon candy. Previously has tried Chantix twice but had mood changes. She has also tried nicotine patches but felt nauseated with them.  Review of Systems Reports constant chest pain & pressure. She notices this especially with bending over. Feels like something is "sitting on her chest". She has chronic fever, chills, & sweats.  Allergies  Allergen Reactions  . Dilaudid [Hydromorphone Hcl] Hives and Nausea Only  . Minocycline Hcl     REACTION: Dizzy  . Prednisone     REACTION: feels like throat swelling  . Varenicline Tartrate     REACTION: Dizzy(chantix)   . Zocor [Simvastatin - High Dose] Other (See Comments)    myalgia   Current Outpatient  Prescriptions on File Prior to Visit  Medication Sig Dispense Refill  . albuterol (PROVENTIL HFA;VENTOLIN HFA) 108 (90 BASE) MCG/ACT inhaler Inhale 2 puffs into the lungs every 4 (four) hours as needed for wheezing or shortness of breath. 1 Inhaler 3  . budesonide (PULMICORT) 0.5 MG/2ML nebulizer solution Take 2 mLs (0.5 mg total) by nebulization 2 (two) times daily. Dx: COPD J44.9 120 mL 6  . fentaNYL (DURAGESIC - DOSED MCG/HR) 25 MCG/HR patch Place 25 mcg onto the skin every 3 (three) days.    . fish oil-omega-3 fatty acids 1000 MG capsule Take 2 g by mouth daily.      [name] ipratropium-albuterol (DUONEB) 0.5-2.5 (3) MG/3ML SOLN 1 vial via nebulizer at breakfast, lunch, dinner and bedtime daily. May take 2 additional treatments on bad day Dx: COPD J44.9 360 mL 3  . levothyroxine (SYNTHROID, LEVOTHROID) 125 MCG tablet Take 1 tablet (125 mcg total) by mouth daily. 30 tablet 3  . nitroGLYCERIN (NITROSTAT) 0.4 MG SL tablet Place 1 tablet (0.4 mg total) under the tongue every 5 (five) minutes as needed for chest pain. 25 tablet 3  . rosuvastatin (CRESTOR) 40 MG tablet Take 1 tablet (40 mg total) by mouth daily. 90 tablet 1  . ticagrelor (BRILINTA) 90 MG TABS tablet Take 90 mg by mouth 2 (two) times daily.    [name] warfarin (COUMADIN) 1 MG tablet Take 1 tablet (1mg ) along with 1 tablet (5mg ) daily except on Fridays.  On Fridays take only 5mg  tablet. 45 tablet 3  .  warfarin (COUMADIN) 5 MG tablet Take 1 tablet daily or as directed 30 tablet 6   No current facility-administered medications on file prior to visit.   Past Medical History  Diagnosis Date  . Arteriosclerotic cardiovascular disease (ASCVD) 2002    Inf STEMI-2002. 2003-cutting balloon + brachytherapy for restenosis; subsequent acute stent thrombosis 06/2010 requiring 2 separate interventions (Zeta stent, then repeat cath with thrombectomy). focal basal inf AK, nl EF; 03/2011: Patent stents, minor nonobst  residual dz, nl EF; neg stress nuclear in 2008  and stress echo in 2009  . Pulmonary embolism 2006    Associated with deep vein thrombosis-2006; + factor V Leiden  . Factor V Leiden, prothrombin gene mutation 2006  . Hyperlipidemia   . COPD (chronic obstructive pulmonary disease)     02 dependent  . Tobacco abuse     50 pack years  . Hypothyroidism   . Noncompliance   . Pelvic fracture 2009  . Chronic anticoagulation     Warfarin plus ticagrelor  . Acute MI     x4, code blue x3  . Chronic respiratory failure 08/27/2013    On 2L 02   Past Surgical History  Procedure Laterality Date  . Coronary angioplasty  2002, 2003, 2012  . Colonoscopy  Approximately 2000    Negative screening study  . Left and right heart catheterization with coronary angiogram N/A 04/03/2011    Procedure: LEFT AND RIGHT HEART CATHETERIZATION WITH CORONARY ANGIOGRAM;  Surgeon: Sherren Mocha, MD;  Location: [facility] CATH LAB;  Service: Cardiovascular;  Laterality: N/A;   Family History  Problem Relation Age of Onset  . Emphysema Mother   . Alzheimer's disease Mother   . Emphysema Sister   . Heart disease Father   . Factor V Leiden deficiency Father   . Factor V Leiden deficiency Sister   . Factor V Leiden deficiency Daughter   . Kidney cancer Paternal Grandmother    Social History   Social History  . Marital Status: Divorced    Spouse Name: N/A  . Number of Children: N/A  . Years of Education: N/A   Occupational History  . Disabled   . Truck driver    Social History Main Topics  . Smoking status: Former Smoker -- 1.00 packs/day for 50 years    Types: Cigarettes    Start date: 05/10/1968  . Smokeless tobacco: Never Used     Comment: quit in 2015 but has restarted in April 2016-smokes about 5 cigs daily now 09/21/14.   [name] Alcohol Use: No  . Drug Use: No  . Sexual Activity: Not Asked   Other Topics Concern  . None   Social History Narrative   Originally from [place]. Previously has lived in [place], [place], [place] & moved back to [place] in 1994. 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 Ronie Spies was in her current home. She also had an owl living in her house as a rehab pet. She also had chickens until Fall 2015. No mold exposure.       Objective:   Physical Exam Blood pressure 140/76, pulse 92, height 5\' 6"  (1.676 m), weight 201 lb 12.8 oz (91.536 kg), SpO2 92 %. General:  Awake. Alert. No acute distress. Central obesity  Integument:  Warm & dry. No rash on exposed skin. No bruising. HEENT:  Moist mucus membranes. No oral ulcers. No scleral injection or icterus.  Minimal nasal turbinate  swelling PERRL. Cardiovascular:  Regular rate.  Regular rhythm. Trace edema lower extremities.  Pulmonary:  Symmetrically decreased breath sounds. Prolonged exhalation phase. Symmetric chest wall expansion. No accessory muscle use on room air . Abdomen: Soft. Normal bowel sounds.  Protuberant.  PFT 07/05/11: FVC 1.38 L (42%) FEV1 0.68 L (28%) FEV1/FVC 0.49 FEF 25-75 0.20 L (70%) no bronchodilator response TLC 4.37 L (83%) RV 142% ERV 61% DLCO uncorrected 39% 05/31/04: FVC 2.14 L (62%) FEV1 1.23 L (47%) FEV1/FVC 0.57 FEF 25-75 0.56 L (19%) no bronchodilator response TLC 4.78 L (89%) RV 135% ERV 12% DLCO UNcorrected 65%  IMAGING CTA CHEST 08/25/13 (per radiologist): No pulmonary embolus. Emphysematous & bronchitic changes. Chronic atelectasis versus scarring left lower lobe. Patchy segmental infiltrate in the posterior right lower lobe. No pleural effusion. Small pericardial effusion.  CARDIAC TTE (04/29/13): Mild LVH. EF 50-55 %. grade 1 diastolic dysfunction. Akinesis of basal-mid inferior myocardium. LA & RA normal in size. RV normal in size and function. No aortic stenosis or regurgitation. Trivial mitral regurgitation. Trivial tricuspid regurgitation. IVC normal in size. Small pericardial effusion.   LABS 09/03/14 INR:  1.9    Assessment & Plan:   64 year old female with underlying very  severe COPD. She has restarted tobacco use and I spent over 3 minutes counseling her on the need to completely quit smoking given the severity of her underlying COPD and emphysema. I do question whether or not the patient may need to be on oxygen therapy with ambulation. I cannot find where she has been screening for alpha-1 antitrypsin deficiency yet either. She seems to be having intermittent symptoms from her underlying reflux and is not clear to me whether or not this is a significant contributor to her ongoing cough. If symptoms do not dramatically improve with smoking cessation by next appointment this will need to be investigated further with a barium swallow and possibly empiric treatment with Pepcid on a daily basis. There is a high suspicion for OSA the patient herself admits she cannot afford testing. I instructed the patient to contact my office if she had any new breathing problems before her next appointment. She reports her Coumadin has been adjusted by her cardiologist with her last INR 1.9 and known factor V Leiden with history of pulmonary emboli & myocardial infarction.  1. Very severe COPD with emphysema: Continuing patient on DuoNeb 4 times a day & budesonide nebulized twice daily. Screening for alpha-1 antitrypsin deficiency today. Repeat spirometry with bronchodilator challenge at next appointment. 2. History of pulmonary embolus & factor V Leiden: Currently on Coumadin. Managed by cardiology. Checking 6 minute walk test at next appointment. 3. GERD: Currently intermittent symptoms. If cough persists at next appointment consider barium swallow & daily Pepcid treatment. 4. Nocturnal hypoxia: Highly suspicious for OSA. Continuing oxygen therapy at night. 6 prolonged test at next appointment. Readdress polysomnogram after the age of 44. 5. Ongoing tobacco use disorder: Counseled the patient for over 3 minutes on need for complete tobacco cessation. 6. Follow-up: Patient to return to clinic  in 1-2 months or sooner if needed.

## 2014-09-26 LAB — ALPHA-1 ANTITRYPSIN PHENOTYPE: A-1 Antitrypsin: 174 mg/dL (ref 83–199)

## 2014-09-30 ENCOUNTER — Ambulatory Visit (INDEPENDENT_AMBULATORY_CARE_PROVIDER_SITE_OTHER): Payer: Medicare Other | Admitting: *Deleted

## 2014-09-30 DIAGNOSIS — Z5181 Encounter for therapeutic drug level monitoring: Secondary | ICD-10-CM | POA: Diagnosis not present

## 2014-09-30 DIAGNOSIS — I4891 Unspecified atrial fibrillation: Secondary | ICD-10-CM | POA: Diagnosis not present

## 2014-09-30 LAB — POCT INR: INR: 2.5

## 2014-09-30 MED ORDER — WARFARIN SODIUM 5 MG PO TABS: ORAL_TABLET | ORAL | Status: DC

## 2014-09-30 MED ORDER — WARFARIN SODIUM 1 MG PO TABS: ORAL_TABLET | ORAL | Status: DC

## 2014-10-05 ENCOUNTER — Other Ambulatory Visit: Payer: Self-pay | Admitting: *Deleted

## 2014-10-05 DIAGNOSIS — Z5181 Encounter for therapeutic drug level monitoring: Secondary | ICD-10-CM

## 2014-10-05 DIAGNOSIS — I4891 Unspecified atrial fibrillation: Secondary | ICD-10-CM

## 2014-10-05 MED ORDER — WARFARIN SODIUM 1 MG PO TABS: ORAL_TABLET | ORAL | Status: DC

## 2014-10-05 MED ORDER — WARFARIN SODIUM 5 MG PO TABS: ORAL_TABLET | ORAL | Status: DC

## 2014-10-28 ENCOUNTER — Encounter: Payer: Self-pay | Admitting: Cardiovascular Disease

## 2014-10-28 ENCOUNTER — Telehealth: Payer: Self-pay | Admitting: *Deleted

## 2014-10-28 ENCOUNTER — Ambulatory Visit (INDEPENDENT_AMBULATORY_CARE_PROVIDER_SITE_OTHER): Payer: Medicare Other | Admitting: Cardiovascular Disease

## 2014-10-28 VITALS — BP 128/74 | HR 99 | Ht 66.0 in | Wt 195.0 lb

## 2014-10-28 DIAGNOSIS — I25118 Atherosclerotic heart disease of native coronary artery with other forms of angina pectoris: Secondary | ICD-10-CM | POA: Diagnosis not present

## 2014-10-28 DIAGNOSIS — I6523 Occlusion and stenosis of bilateral carotid arteries: Secondary | ICD-10-CM

## 2014-10-28 DIAGNOSIS — D6851 Activated protein C resistance: Secondary | ICD-10-CM

## 2014-10-28 DIAGNOSIS — E785 Hyperlipidemia, unspecified: Secondary | ICD-10-CM

## 2014-10-28 DIAGNOSIS — Z5181 Encounter for therapeutic drug level monitoring: Secondary | ICD-10-CM

## 2014-10-28 DIAGNOSIS — J438 Other emphysema: Secondary | ICD-10-CM

## 2014-10-28 DIAGNOSIS — Z79899 Other long term (current) drug therapy: Secondary | ICD-10-CM | POA: Diagnosis not present

## 2014-10-28 DIAGNOSIS — Z7901 Long term (current) use of anticoagulants: Secondary | ICD-10-CM

## 2014-10-28 DIAGNOSIS — Z136 Encounter for screening for cardiovascular disorders: Secondary | ICD-10-CM | POA: Diagnosis not present

## 2014-10-28 DIAGNOSIS — I10 Essential (primary) hypertension: Secondary | ICD-10-CM

## 2014-10-28 DIAGNOSIS — I2699 Other pulmonary embolism without acute cor pulmonale: Secondary | ICD-10-CM

## 2014-10-28 DIAGNOSIS — F17200 Nicotine dependence, unspecified, uncomplicated: Secondary | ICD-10-CM

## 2014-10-28 MED ORDER — TICAGRELOR 60 MG PO TABS: 60.0000 mg | ORAL_TABLET | Freq: Two times a day (BID) | ORAL | Status: DC

## 2014-10-28 MED ORDER — NITROGLYCERIN 0.4 MG SL SUBL: 0.4000 mg | SUBLINGUAL_TABLET | SUBLINGUAL | Status: AC | PRN

## 2014-10-28 NOTE — Telephone Encounter (Signed)
INR at office visit today is 4.2 with first stick and 3.5 with second stick. Patient states that the only new medicine is Magnesium 500 mg Daily.

## 2014-10-28 NOTE — Patient Instructions (Signed)
Your physician wants you to follow-up in: [place]. Bronson Ing. You will receive a reminder letter in the mail two months in advance. If you don't receive a letter, please call our office to schedule the follow-up appointment.  DECREASE YOUR BRILINTA TO 60 MG TWO TIMES DAILY  I REFILLED YOUR NITRO  Your physician has requested that you have a carotid duplex. This test is an ultrasound of the carotid arteries in your neck. It looks at blood flow through these arteries that supply the brain with blood. Allow one hour for this exam. There are no restrictions or special instructions.  Your physician recommends that you return for lab work in:  [place]   Thanks for choosing [facility]!!!

## 2014-10-28 NOTE — Progress Notes (Signed)
Patient ID: [name], female   DOB: 11-Oct-1950, 64 y.o.   MRN: [id number]      SUBJECTIVE: The patient presents for routine cardiovascular follow up. She has a history of coronary artery disease with prior PCI of the RCA and has had both in-stent restenosis as well as in-stent thrombosis. She has a history of pulmonary embolism and factor V Leiden deficiency, and is on chronic warfarin therapy. She is also on Brilinta. She has a history of tobacco abuse, obesity, and severe COPD and follows with pulmonary.  She has  Smoked a few cigarettes here and there but uses cinnamon candy to try to break this habit. She denies exertional chest pain. She has chronic exertional dyspnea due to severe COPD. She uses 2 L of oxygen at night. She is due for spirometry with broncho-dilator challenge in November. A 6 minute walk test will also be performed by pulmonary.  She requests a refill of her sublingual nitroglycerin. She did not take Repatha because she said she could not afford it, although I told her at last visit that the company will provide it for her free of charge.   ECG performed in the office today demonstrates sinus rhythm with an isolated PVC and a nonspecific conduction delay, QRS duration 104 ms.  Review of Systems: As per "subjective", otherwise negative.  Allergies  Allergen Reactions  . Dilaudid [Hydromorphone Hcl] Hives and Nausea Only  . Minocycline Hcl     REACTION: Dizzy  . Prednisone     REACTION: feels like throat swelling  . Varenicline Tartrate     REACTION: Dizzy(chantix)   . Zocor [Simvastatin - High Dose] Other (See Comments)    myalgia    Current Outpatient Prescriptions  Medication Sig Dispense Refill  . albuterol (PROVENTIL HFA;VENTOLIN HFA) 108 (90 BASE) MCG/ACT inhaler Inhale 2 puffs into the lungs every 4 (four) hours as needed for wheezing or shortness of breath. 1 Inhaler 3  . budesonide (PULMICORT) 0.5 MG/2ML nebulizer solution Take 2 mLs (0.5 mg total)  by nebulization 2 (two) times daily. Dx: J43.9 120 mL 3  . fentaNYL (DURAGESIC - DOSED MCG/HR) 25 MCG/HR patch Place 25 mcg onto the skin every 3 (three) days.    . fish oil-omega-3 fatty acids 1000 MG capsule Take 2 g by mouth daily.      [name] ipratropium-albuterol (DUONEB) 0.5-2.5 (3) MG/3ML SOLN 1 vial via nebulizer at breakfast, lunch, dinner and bedtime daily. May take 2 additional treatments on bad day Dx: J43.9 360 mL 3  . levothyroxine (SYNTHROID, LEVOTHROID) 125 MCG tablet Take 1 tablet (125 mcg total) by mouth daily. 30 tablet 3  . Magnesium 500 MG CAPS Take 500 mg by mouth.    . nitroGLYCERIN (NITROSTAT) 0.4 MG SL tablet Place 1 tablet (0.4 mg total) under the tongue every 5 (five) minutes as needed for chest pain. 25 tablet 3  . rosuvastatin (CRESTOR) 40 MG tablet Take 1 tablet (40 mg total) by mouth daily. 90 tablet 1  . ticagrelor (BRILINTA) 90 MG TABS tablet Take 90 mg by mouth 2 (two) times daily.    [name] warfarin (COUMADIN) 1 MG tablet Take 1 tablet (1mg ) along with 1 tablet (5mg ) daily except on Fridays.  On Fridays take only 5mg  tablet. 45 tablet 3  . warfarin (COUMADIN) 5 MG tablet Take 1 tablet daily or as directed 30 tablet 6   No current facility-administered medications for this visit.    Past Medical History  Diagnosis Date  . Arteriosclerotic  cardiovascular disease (ASCVD) 2002    Inf STEMI-2002. 2003-cutting balloon + brachytherapy for restenosis; subsequent acute stent thrombosis 06/2010 requiring 2 separate interventions (Zeta stent, then repeat cath with thrombectomy). focal basal inf AK, nl EF; 03/2011: Patent stents, minor nonobst  residual dz, nl EF; neg stress nuclear in 2008 and stress echo in 2009  . Pulmonary embolism ([place]) 2006    Associated with deep vein thrombosis-2006; + factor V Leiden  . Factor V Leiden, prothrombin gene mutation ([place]) 2006  . Hyperlipidemia   . COPD (chronic obstructive pulmonary disease) ([place])     02 dependent  . Tobacco abuse     50  pack years  . Hypothyroidism   . Noncompliance   . Pelvic fracture ([place]) 2009  . Chronic anticoagulation     Warfarin plus ticagrelor  . Acute MI ([place])     x4, code blue x3  . Chronic respiratory failure ([place]) 08/27/2013    On 2L 02    Past Surgical History  Procedure Laterality Date  . Coronary angioplasty  2002, 2003, 2012  . Colonoscopy  Approximately 2000    Negative screening study  . Left and right heart catheterization with coronary angiogram N/A 04/03/2011    Procedure: LEFT AND RIGHT HEART CATHETERIZATION WITH CORONARY ANGIOGRAM;  Surgeon: Sherren Mocha, MD;  Location: [facility] CATH LAB;  Service: Cardiovascular;  Laterality: N/A;    Social History   Social History  . Marital Status: Divorced    Spouse Name: N/A  . Number of Children: N/A  . Years of Education: N/A   Occupational History  . Disabled   . Truck driver    Social History Main Topics  . Smoking status: Current Every Day Smoker -- 1.00 packs/day for 50 years    Types: Cigarettes    Start date: 05/10/1968  . Smokeless tobacco: Never Used     Comment: quit in 2015 but has restarted in April 2016-smokes about 5 cigs daily now 09/21/14.   [name] Alcohol Use: No  . Drug Use: No  . Sexual Activity: Not on file   Other Topics Concern  . Not on file   Social History Narrative   Originally from [place]. Previously has lived in [place], [place], [place] & moved back to [place] in 1994. 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 Ronie Spies was in her current home. She also had an owl living in her house as a rehab pet. She also had chickens until Fall 2015. No mold exposure.      Filed Vitals:   10/28/14 0842  BP: 128/74  Pulse: 99  Height: 5\' 6"  (1.676 m)  Weight: 195 lb (88.451 kg)  SpO2: 90%    PHYSICAL EXAM General: NAD HEENT: Normal. Neck: No JVD, no thyromegaly. Lungs: Prolonged expiratory phase with inspiratory wheezes, no rales, poor air  movement. CV: Nondisplaced PMI. Rate at upper normal limits, regular rhythm, normal S1/S2, no S3/S4, no murmur. No pretibial or periankle edema. Stasis dermatitis b/l.  Abdomen: Soft, nontender, obese. Neurologic: Alert and oriented x 3.  Psych: Normal affect. Skin: Stasis dermatitis b/l LE's. Musculoskeletal: Normal range of motion, no gross deformities. Extremities: + clubbing of fingers.  ECG: Most recent ECG reviewed.     ASSESSMENT AND PLAN: 1. CAD with history of PCI of the RCA and has had both in-stent restenosis as well as in-stent thrombosis: Stable ischemic heart disease from a symptomatic standpoint. Did  not feel Ranexa helped in the past. Continue Brilinta (reduce to 60 mg bid) and rosuvastatin. Will refill SL nitroglycerin.   2. Essential HTN: Controlled on present therapy. No changes.  3. Dyslipidemia: On Crestor 40 mg daily. Lipids on 03/30/14 showed LDL 182. The addition of Zetia is unlikely to reduce LDL significantly. I again told her that I feel she is a candidate for PCSK-9 inhibitors. Plan to start [place]. I will speak to representative today. Will repeat lipids.  4. Factor V Leiden mutation: Given her history of pulmonary embolism and genetic mutation, she requires lifelong therapy with warfarin.   5. Carotid artery disease: Less than 50% stenosis bilaterally in November 2013. Continue statin therapy.  Repeat Dopplers.  Dispo: f/u 6 months.    Kate Sable, M.D., F.A.C.C.

## 2014-10-29 DIAGNOSIS — Z79899 Other long term (current) drug therapy: Secondary | ICD-10-CM | POA: Diagnosis not present

## 2014-10-29 LAB — LIPID PANEL
CHOLESTEROL: 193 mg/dL (ref 125–200)
HDL: 30 mg/dL — ABNORMAL LOW (ref >=46)
LDL Cholesterol: 136 mg/dL — ABNORMAL HIGH (ref <130)
Total CHOL/HDL Ratio: 6.4 Ratio — ABNORMAL HIGH (ref <=5.0)
Triglycerides: 137 mg/dL (ref <150)
VLDL: 27 mg/dL (ref <30)

## 2014-11-02 NOTE — Telephone Encounter (Signed)
Called pt to see if [facility] called pt 10/19 to adjust her coumadin. (no documentation in chart)  She states she did not hear from anyone.  She held coumadin that night then resumed her current dose.  Will recheck INR on Wednesday 10/26.  Pt in agreement.

## 2014-11-04 ENCOUNTER — Ambulatory Visit (INDEPENDENT_AMBULATORY_CARE_PROVIDER_SITE_OTHER): Payer: Medicare Other | Admitting: *Deleted

## 2014-11-04 ENCOUNTER — Ambulatory Visit (HOSPITAL_COMMUNITY)
Admission: RE | Admit: 2014-11-04 | Discharge: 2014-11-04 | Disposition: A | Discharge status: Home / Self Care | Payer: Medicare Other | Source: Ambulatory Visit | Attending: Cardiovascular Disease | Admitting: Cardiovascular Disease

## 2014-11-04 DIAGNOSIS — Z5181 Encounter for therapeutic drug level monitoring: Secondary | ICD-10-CM | POA: Diagnosis not present

## 2014-11-04 DIAGNOSIS — I6523 Occlusion and stenosis of bilateral carotid arteries: Secondary | ICD-10-CM | POA: Insufficient documentation

## 2014-11-04 LAB — POCT INR: INR: 1.9

## 2014-11-06 ENCOUNTER — Ambulatory Visit: Payer: Medicare Other | Admitting: Cardiovascular Disease

## 2014-11-06 ENCOUNTER — Telehealth: Payer: Self-pay | Admitting: *Deleted

## 2014-11-06 NOTE — Telephone Encounter (Signed)
Spoke with pt to inform her that she has been approved to receive Repatha for $7.40 a month. Patient states that she received a phone call from the pharmacy a few days ago stating the same thing and she refused the medicine. Patient states that she can not afford the Repatha at this price. Will notify Dr.Koneswaran.

## 2014-11-06 NOTE — Telephone Encounter (Signed)
-----   Message from Herminio Commons, MD sent at 11/05/2014 12:08 PM EDT ----- Can be repeated in 2 years. Mild plaque disease bilaterally.

## 2014-11-06 NOTE — Telephone Encounter (Signed)
Notes Recorded by Laurine Blazer, LPN on 57/49/3552 at 10:35 AM Left message to return call.  "[name]"

## 2014-11-09 ENCOUNTER — Encounter: Payer: Self-pay | Admitting: *Deleted

## 2014-11-09 NOTE — Telephone Encounter (Signed)
Notes Recorded by Laurine Blazer, LPN on 34/28/7681 at 9:50 AM Patient notified of results by letter.

## 2014-11-10 ENCOUNTER — Telehealth: Payer: Self-pay

## 2014-11-10 NOTE — Telephone Encounter (Signed)
Pt had apt with Repatha education nurse,Kristie Rollene Rotunda. I hear patient become loud, stating " Im done " "You are trying to force something on me that I don't want"  She then briskly walked out of dept. Dr.Koneswran made aware

## 2014-11-25 ENCOUNTER — Ambulatory Visit (INDEPENDENT_AMBULATORY_CARE_PROVIDER_SITE_OTHER): Payer: Medicare Other | Admitting: *Deleted

## 2014-11-25 DIAGNOSIS — Z5181 Encounter for therapeutic drug level monitoring: Secondary | ICD-10-CM | POA: Diagnosis not present

## 2014-11-25 LAB — POCT INR: INR: 3

## 2014-11-26 ENCOUNTER — Ambulatory Visit (INDEPENDENT_AMBULATORY_CARE_PROVIDER_SITE_OTHER): Payer: Medicare Other | Admitting: Pulmonary Disease

## 2014-11-26 ENCOUNTER — Encounter: Payer: Self-pay | Admitting: Pulmonary Disease

## 2014-11-26 ENCOUNTER — Telehealth: Payer: Self-pay | Admitting: *Deleted

## 2014-11-26 VITALS — BP 122/62 | HR 96 | Ht 66.0 in | Wt 161.0 lb

## 2014-11-26 DIAGNOSIS — J449 Chronic obstructive pulmonary disease, unspecified: Secondary | ICD-10-CM

## 2014-11-26 DIAGNOSIS — R0600 Dyspnea, unspecified: Secondary | ICD-10-CM | POA: Insufficient documentation

## 2014-11-26 DIAGNOSIS — J9611 Chronic respiratory failure with hypoxia: Secondary | ICD-10-CM

## 2014-11-26 DIAGNOSIS — R634 Abnormal weight loss: Secondary | ICD-10-CM | POA: Diagnosis not present

## 2014-11-26 DIAGNOSIS — I6523 Occlusion and stenosis of bilateral carotid arteries: Secondary | ICD-10-CM

## 2014-11-26 DIAGNOSIS — K219 Gastro-esophageal reflux disease without esophagitis: Secondary | ICD-10-CM | POA: Diagnosis not present

## 2014-11-26 DIAGNOSIS — I2699 Other pulmonary embolism without acute cor pulmonale: Secondary | ICD-10-CM

## 2014-11-26 DIAGNOSIS — J439 Emphysema, unspecified: Secondary | ICD-10-CM | POA: Diagnosis not present

## 2014-11-26 DIAGNOSIS — F17200 Nicotine dependence, unspecified, uncomplicated: Secondary | ICD-10-CM

## 2014-11-26 LAB — PULMONARY FUNCTION TEST
DL/VA % PRED: 54 %
DL/VA: 2.72 ml/min/mmHg/L
DLCO UNC: 6.19 ml/min/mmHg
DLCO unc % pred: 22 %
FEF 25-75 Post: 0.5 L/sec
FEF 25-75 Pre: 0.18 L/sec
FEF2575-%Change-Post: 181 %
FEF2575-%Pred-Post: 21 %
FEF2575-%Pred-Pre: 7 %
FEV1-%CHANGE-POST: 46 %
FEV1-%PRED-PRE: 17 %
FEV1-%Pred-Post: 25 %
FEV1-Post: 0.66 L
FEV1-Pre: 0.45 L
FEV1FVC-%Change-Post: -1 %
FEV1FVC-%Pred-Pre: 64 %
FEV6-%Change-Post: 45 %
FEV6-%PRED-POST: 39 %
FEV6-%Pred-Pre: 27 %
FEV6-POST: 1.3 L
FEV6-PRE: 0.9 L
FEV6FVC-%CHANGE-POST: -1 %
FEV6FVC-%PRED-POST: 101 %
FEV6FVC-%PRED-PRE: 102 %
FVC-%CHANGE-POST: 49 %
FVC-%PRED-POST: 39 %
FVC-%Pred-Pre: 26 %
FVC-Post: 1.35 L
FVC-Pre: 0.91 L
POST FEV6/FVC RATIO: 97 %
PRE FEV6/FVC RATIO: 99 %
Post FEV1/FVC ratio: 49 %
Pre FEV1/FVC ratio: 49 %

## 2014-11-26 MED ORDER — ALBUTEROL SULFATE (2.5 MG/3ML) 0.083% IN NEBU: 2.5000 mg | INHALATION_SOLUTION | RESPIRATORY_TRACT | Status: DC | PRN

## 2014-11-26 NOTE — Patient Instructions (Signed)
1. Please call me if you have any new breathing problems before your next appointment. 2. Continue using her nebulizer medications as prescribed. 3. We are sending in a prescription for albuterol to use in your nebulizer which will replace your rescue inhaler. 4. I would recommend looking into alternative prescription drug plan which may give you more options on your inhalers next year.  5. I will see you back in 6 months or sooner if needed.

## 2014-11-26 NOTE — Telephone Encounter (Signed)
Spoke with Verdis Frederickson at [employer] who states that the pt has been approved for Brilinta at no charge for one year. Patient notified and voiced understanding.

## 2014-11-26 NOTE — Progress Notes (Signed)
PFT done today. 

## 2014-11-26 NOTE — Progress Notes (Signed)
Subjective:    Patient ID: [name], female    DOB: Nov 29, 1950, 64 y.o.   MRN: [id number]  C.C.:  Follow-up for Very Severe COPD w/ Emphysema, H/O Recurrent Pulmonary Embolus, GERD, Chronic Hypoxic Respiratory Failure, & Ongoing Tobacco Use.  HPI Very Severe COPD w/ Emphysema:  Prescribed Duonebs qid & Budesonide neb bid. No evidence for alpha-1 antitrypsin deficiency on prior serum testing. She admits she does have intermittent dyspnea. Chronic cough productive of a "foamy yellow & white" mucus. Denies any hemoptysis. No exacerbations since last appointment.  Reports she is compliant with her nebulizer regimen. She is using her rescue inhaler 6 separate times daily. She reports ProAir will no longer be covered starting in January. Previously she was on Ventolin and felt it worked better, especially during testing today. However, Ventolin is not covered in her prescription drug plan. Previously was on Prednisone but unable to sleep or eat on the medication.   H/O Recurrent Pulmonary Embolus:  Reported genetic predisoposition with Factor V Leiden. Currently on Coumadin. Last PE around 2006. Father also had a history of blood clots. Coumadin managed by cardiologist Dr. Bronson Ing. INR therapeutic yesterday.  GERD:  No odynophagia. She does report intermittent dysphagia. Does have intermittent reflux but nothing consistent. No routine medications. She uses Tums intermittently.  Chronic Hypoxic Respiratory Failure:  She previously reported she cannot afford the testing for OSA. She uses 2 L/m sleeping at night. Symptoms highly suspicious for sleep apnea. Plan to readdress polysomnogram after the age of 75. Walk test today showed the patient desaturated to 88% at the time the patient had to stop. She does have portable tanks at home but doesn't use them.   Ongoing Tobacco Use:  Previously has tried Chantix twice but had mood changes. She has also tried nicotine patches but felt nauseated with  them. She continues to average 1/2ppd use. She reports she smoked more anticipating her PFT today. She continues to try hard candy to replace her habit. She has a fake cigarette but admit she knows "it's fake".  Review of Systems She reports chronic hot flashes & chills. She has been losing weight without trying. She reports she has poor appetite and sometimes goes days without eating. No adenopathy in her neck, groin, or axilla. Constant chronic chest pain that is unchanged.  Allergies  Allergen Reactions  . Dilaudid [Hydromorphone Hcl] Hives and Nausea Only  . Minocycline Hcl     REACTION: Dizzy  . Prednisone     REACTION: feels like throat swelling  . Varenicline Tartrate     REACTION: Dizzy(chantix)   . Zocor [Simvastatin - High Dose] Other (See Comments)    myalgia   Current Outpatient Prescriptions on File Prior to Visit  Medication Sig Dispense Refill  . albuterol (PROVENTIL HFA;VENTOLIN HFA) 108 (90 BASE) MCG/ACT inhaler Inhale 2 puffs into the lungs every 4 (four) hours as needed for wheezing or shortness of breath. 1 Inhaler 3  . budesonide (PULMICORT) 0.5 MG/2ML nebulizer solution Take 2 mLs (0.5 mg total) by nebulization 2 (two) times daily. Dx: J43.9 120 mL 3  . fentaNYL (DURAGESIC - DOSED MCG/HR) 25 MCG/HR patch Place 25 mcg onto the skin every 3 (three) days.    . fish oil-omega-3 fatty acids 1000 MG capsule Take 2 g by mouth daily.      [name] ipratropium-albuterol (DUONEB) 0.5-2.5 (3) MG/3ML SOLN 1 vial via nebulizer at breakfast, lunch, dinner and bedtime daily. May take 2 additional treatments on bad day Dx: J43.9  360 mL 3  . levothyroxine (SYNTHROID, LEVOTHROID) 125 MCG tablet Take 1 tablet (125 mcg total) by mouth daily. 30 tablet 3  . Magnesium 500 MG CAPS Take 500 mg by mouth.    . nitroGLYCERIN (NITROSTAT) 0.4 MG SL tablet Place 1 tablet (0.4 mg total) under the tongue every 5 (five) minutes as needed for chest pain. 25 tablet 3  . rosuvastatin (CRESTOR) 40 MG tablet  Take 1 tablet (40 mg total) by mouth daily. 90 tablet 1  . ticagrelor (BRILINTA) 60 MG TABS tablet Take 1 tablet (60 mg total) by mouth 2 (two) times daily. 60 tablet 11  . warfarin (COUMADIN) 1 MG tablet Take 1 tablet (1mg ) along with 1 tablet (5mg ) daily except on Fridays.  On Fridays take only 5mg  tablet. 45 tablet 3  . warfarin (COUMADIN) 5 MG tablet Take 1 tablet daily or as directed 30 tablet 6   No current facility-administered medications on file prior to visit.   Past Medical History  Diagnosis Date  . Arteriosclerotic cardiovascular disease (ASCVD) 2002    Inf STEMI-2002. 2003-cutting balloon + brachytherapy for restenosis; subsequent acute stent thrombosis 06/2010 requiring 2 separate interventions (Zeta stent, then repeat cath with thrombectomy). focal basal inf AK, nl EF; 03/2011: Patent stents, minor nonobst  residual dz, nl EF; neg stress nuclear in 2008 and stress echo in 2009  . Pulmonary embolism ([place]) 2006    Associated with deep vein thrombosis-2006; + factor V Leiden  . Factor V Leiden, prothrombin gene mutation ([place]) 2006  . Hyperlipidemia   . COPD (chronic obstructive pulmonary disease) ([place])     02 dependent  . Tobacco abuse     50 pack years  . Hypothyroidism   . Noncompliance   . Pelvic fracture ([place]) 2009  . Chronic anticoagulation     Warfarin plus ticagrelor  . Acute MI ([place])     x4, code blue x3  . Chronic respiratory failure ([place]) 08/27/2013    On 2L 02   Past Surgical History  Procedure Laterality Date  . Coronary angioplasty  2002, 2003, 2012  . Colonoscopy  Approximately 2000    Negative screening study  . Left and right heart catheterization with coronary angiogram N/A 04/03/2011    Procedure: LEFT AND RIGHT HEART CATHETERIZATION WITH CORONARY ANGIOGRAM;  Surgeon: Sherren Mocha, MD;  Location: [facility] CATH LAB;  Service: Cardiovascular;  Laterality: N/A;   Family History  Problem Relation Age of Onset  . Emphysema Mother   . Alzheimer's disease  Mother   . Emphysema Sister   . Heart disease Father   . Factor V Leiden deficiency Father   . Factor V Leiden deficiency Sister   . Factor V Leiden deficiency Daughter   . Kidney cancer Paternal Grandmother    Social History   Social History  . Marital Status: Single    Spouse Name: N/A  . Number of Children: N/A  . Years of Education: N/A   Occupational History  . Disabled   . Truck driver    Social History Main Topics  . Smoking status: Current Every Day Smoker -- 1.00 packs/day for 50 years    Types: Cigarettes    Start date: 05/10/1968  . Smokeless tobacco: Never Used     Comment: 1/2 ppd now 11/26/14  . Alcohol Use: No  . Drug Use: No  . Sexual Activity: Not Asked   Other Topics Concern  . None   Social History Narrative   Originally from [place]. Previously  has lived in [place], [place], [place] & moved back to [place] in 1994. 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 Ronie Spies was in her current home. She also had an owl living in her house as a rehab pet. She also had chickens until Fall 2015. No mold exposure.       Objective:   Physical Exam BP 122/62 mmHg  Pulse 96  Ht 5\' 6"  (1.676 m)  Wt 161 lb (73.029 kg)  BMI 26.00 kg/m2  SpO2 91% General:  Significant weight loss. No distress. Awake. Integument:  Warm & dry. No rash on exposed skin. Venous stasis changes bilateral lower extremities. HEENT:  Moist mucus membranes. No oral ulcers. No scleral injection.   Cardiovascular:  Regular rate.  Regular rhythm. Trace edema lower extremities.  Pulmonary:  Diminished breath sounds bilaterally. Normal work of breathing on room air. Speaking in complete sentences. Abdomen: Soft. Normal bowel sounds.  Protuberant.  PFT 11/26/14: FVC 0.91 L (26%) FEV1 0.45 L (17%) FEV1/FVC 0.49 FEF 25-75 0.18 L (7%) positive bronchodilator response                                                                   DLCO uncorrected  22% 07/05/11: FVC 1.38 L (42%) FEV1 0.68 L (28%) FEV1/FVC 0.49 FEF 25-75 0.20 L (7%) negative bronchodilator response TLC 4.37 L (83%) RV 142% ERV 61% DLCO uncorrected 39% 05/31/04: FVC 2.14 L (62%) FEV1 1.23 L (47%) FEV1/FVC 0.57 FEF 25-75 0.56 L (19%) negative bronchodilator response TLC 4.78 L (89%) RV 135% ERV 12% DLCO UNcorrected 65%  6MWT 11/26/14:  Walked 48 meters / Baseline Sat 90% on RA / Nadir Sat 88% on RA (heart rate was irregular & pt ambulated for <78min due to dyspnea)  IMAGING CTA CHEST 08/25/13 (per radiologist): No pulmonary embolus. Emphysematous & bronchitic changes. Chronic atelectasis versus scarring left lower lobe. Patchy segmental infiltrate in the posterior right lower lobe. No pleural effusion. Small pericardial effusion.  CARDIAC TTE (04/29/13): Mild LVH. EF 50-55 %. grade 1 diastolic dysfunction. Akinesis of basal-mid inferior myocardium. LA & RA normal in size. RV normal in size and function. No aortic stenosis or regurgitation. Trivial mitral regurgitation. Trivial tricuspid regurgitation. IVC normal in size. Small pericardial effusion.   LABS 11/25/14 INR:  3.0  09/21/14 Alpha-1 antitrypsin: MM (174)    Assessment & Plan:   64 year old female with underlying very severe COPD. Her spirometry has significantly worsened since previous testing & she now has a significant bronchodilator response. Additionally she has evidence of hypoxia with exertion that is likely contributing to her exertional dyspnea. I spent a significant amount of time today reviewing her need for tobacco cessation in the setting of her declining lung function. Overall her reflux is control and I do not feel contributing to her decline. Additionally her weight loss is certainly not contributing to her increased dyspnea except to say that lack of caloric intake is not helping. Certainly her weight loss could be due to lack of appetite but could also be due to occult malignancy. Her INR is therapeutic.  We had a length discussion regarding her mood and apathy toward her current medical condition. She admits  that she would not call for help if she developed any significant problems breathing nor would she want treatment if she had cancer. She has no joy in life with her limited functional capabilities. She gave away her outside pets as she was unable to care for them. She does still have 2 indoor cats & 2 indoor dogs. When I asked her if she had thought about getting treatment for her probable depression she declined feeling depressed but admitted she "shuts herself in" and prefers not to have visitors. She has not responded to Prednisone therapy in the past and is compliant with her nebulizer regimen. I asked her to contact me if she had any new problems breathing before her next appointment. Over 50% of the 47 minutes of time during the patient's visit today was spent discussing her multiple medical problems.  1. Unintentional Weight Loss:  Patient reports she would not want treatment for malignancy even if she has it. Deferring any further investigation into this. Could also be secondary to underlying depression. 2. Very severe COPD with emphysema: Significant worsening. Continuing patient on DuoNeb 4 times a day & budesonide nebulized twice daily. Prescribing her albuterol to use in her nebulizer prn. 3. Chronic Hypoxic Respiratory Failure:  Continuing oxygen at 2 L/m while sleeping. Also starting oxygen at 2 L/m with exertion. Unable to afford polysomnogram although I feel this is likely contributing. 4. History of pulmonary embolus & factor V Leiden: Currently on Coumadin. Managed by cardiology. INR therapeutic. 5. Ongoing tobacco use disorder: Counseled the patient for over 3 minutes on need for complete tobacco cessation. 6. GERD: Currently intermittent symptoms. If cough persists at next appointment consider barium swallow & daily Pepcid treatment. 7. Probable Depression:  Patient denies feeling  depressed. Declines the need for a psychiatry evaluation. I will defer to her PCP. 8. Follow-up: Patient to return to clinic in 6 months or sooner if needed.

## 2014-11-26 NOTE — Progress Notes (Signed)
PFT reviewed

## 2014-11-27 ENCOUNTER — Telehealth: Payer: Self-pay | Admitting: *Deleted

## 2014-11-27 DIAGNOSIS — J438 Other emphysema: Secondary | ICD-10-CM

## 2014-11-27 NOTE — Telephone Encounter (Signed)
I have placed order for daytime O2 w/ exertion LMTCB x1 for pt to make aware

## 2014-11-27 NOTE — Telephone Encounter (Signed)
-----   Message from Javier Glazier, MD sent at 11/26/2014  6:05 PM EST ----- She also has home oxygen that she uses to sleep with. Need to order oxygen for her at 2 L/m with exertion via nasal cannula. Thanks.

## 2014-11-27 NOTE — Telephone Encounter (Signed)
Called made pt aware of below. Nothing further needed 

## 2014-11-30 ENCOUNTER — Telehealth: Payer: Self-pay | Admitting: *Deleted

## 2014-11-30 NOTE — Telephone Encounter (Signed)
Dr. Ashok Cordia, since pt was not placed on O2 pt will need to be brought back in for a qualifying walk. Are you okay with this? thanks

## 2014-11-30 NOTE — Telephone Encounter (Signed)
That's ok with me.

## 2014-11-30 NOTE — Telephone Encounter (Signed)
-----   Message from Joellen Jersey sent at 11/30/2014  4:57 PM EST ----- She may have to be brought back in because we have to have all 3 sats for 02 qualifing ----- Message -----    From: Inge Rise, CMA    Sent: 11/30/2014   3:45 PM      To: Joellen Jersey  i have no clue. i looked in the walk test. It looked like she desaturated and then 2 min later she recovered on RA. Never walked on O2 according to note. ----- Message -----    From: Joellen Jersey    Sent: 11/30/2014   3:43 PM      To: Inge Rise, CMA  Did she have all 3 qualifing sats durning the six min walk  ----- Message -----    From: Inge Rise, CMA    Sent: 11/30/2014   2:55 PM      To: Joellen Jersey  She had a 6MW test done then he sent me a note  ----- Message -----    From: Joellen Jersey    Sent: 11/30/2014   2:47 PM      To: Inge Rise, CMA  [facility] is looking for all 2 sats on this pt ra-rest and exertion and 02 recovery did you put her on 02 and ck her sats after she dropped?

## 2014-12-01 NOTE — Telephone Encounter (Signed)
Patient scheduled for qualifying walk on 12/5. Patient aware of appointment. Nothing further needed. Closing encounter

## 2014-12-01 NOTE — Telephone Encounter (Signed)
LMTCB x1 to schedule for qualifying walk.

## 2014-12-01 NOTE — Telephone Encounter (Signed)
[phone], pt cb

## 2014-12-04 ENCOUNTER — Other Ambulatory Visit: Payer: Self-pay | Admitting: Internal Medicine

## 2014-12-11 ENCOUNTER — Telehealth: Payer: Self-pay | Admitting: Pulmonary Disease

## 2014-12-11 MED ORDER — ALBUTEROL SULFATE HFA 108 (90 BASE) MCG/ACT IN AERS: 2.0000 | INHALATION_SPRAY | RESPIRATORY_TRACT | Status: DC | PRN

## 2014-12-11 NOTE — Telephone Encounter (Signed)
Called spoke with Josh from [facility]. Insurance is deying the albuterol nebulizer stating it requires a PA.  He is going to fax this over to [place] as we have not received this yet. Pt is aware we are working on this. She is completely out of albuterol and is requesting we send in a 1 inhaler until we can get this approved through insurance. I did refill the inhaler to have for rescue since it is the weekend. Will await PA form.

## 2014-12-11 NOTE — Telephone Encounter (Signed)
Per 11/26/14 OV: Patient Instructions       1. Please call me if you have any new breathing problems before your next appointment. 2. Continue using her nebulizer medications as prescribed. 3. We are sending in a prescription for albuterol to use in your nebulizer which will replace your rescue inhaler. 4. I would recommend looking into alternative prescription drug plan which may give you more options on your inhalers next year.   5. I will see you back in 6 months or sooner if needed.  ---  Called spoke with pt. She never received her albuterol neb from the pharm. [facility] called [facility] and they did receive RX but for some reason it is not going through. The pharmacists is going to call pt insurance on his end and see what they are being told. They will call us once they get a response from insurance. Will await call

## 2014-12-11 NOTE — Telephone Encounter (Signed)
Called spoke with Josh from [facility]. He is still trying to get in touch with pt insurance. Will await call back

## 2014-12-14 ENCOUNTER — Ambulatory Visit: Payer: Medicare Other

## 2014-12-14 NOTE — Telephone Encounter (Signed)
Spoke with pt, states that her pharmacy never received her albuterol inhaler rx sent in on Friday.   Called pharmacy, states that inhaler has been ready since 12/11/14 for pt pickup.  Also re-requested albuterol neb pa forms as these have not been received.   Will await forms.  Called pt to make aware that albuterol is ready for pickup.  Pt aware.

## 2014-12-14 NOTE — Telephone Encounter (Signed)
Pt states that [facility] never got rx for albuterol. Pt has been without it all weekend.  Call back at [phone]

## 2014-12-16 ENCOUNTER — Ambulatory Visit (INDEPENDENT_AMBULATORY_CARE_PROVIDER_SITE_OTHER): Payer: Medicare Other | Admitting: *Deleted

## 2014-12-16 ENCOUNTER — Telehealth: Payer: Self-pay | Admitting: *Deleted

## 2014-12-16 DIAGNOSIS — Z5181 Encounter for therapeutic drug level monitoring: Secondary | ICD-10-CM | POA: Diagnosis not present

## 2014-12-16 LAB — POCT INR: INR: 3.3

## 2014-12-16 NOTE — Telephone Encounter (Signed)
Checked [facility]'s box and PA box and could not find fax Called and spoke with pharmacist at [facility], he says that he will manually fax over the PA request. Awaiting fax

## 2014-12-16 NOTE — Telephone Encounter (Signed)
Called pharmacy and found that they ran the neb solution under part D and not part B. They state they don't have they pt's part B information. Called pt and LMOM for pt to call back so that she can be informed to take insurance information to pharmacy so that her medication can be ran through correctly. Will await return call from pt.

## 2014-12-16 NOTE — Telephone Encounter (Signed)
Called spoke with pt. Aware we need to get sats to set her O2 up. She r/s to tomorrow.

## 2014-12-16 NOTE — Telephone Encounter (Signed)
No PA form yet received for pt's albuterol.  I do see where several PA's were faxed to [facility] this morning, but I cannot find the actual PA form.  Misty have you received a PA on this patient, or does this need to be re-requested?  Thanks!

## 2014-12-16 NOTE — Telephone Encounter (Signed)
Just received fax from [facility]. Do you want me to fax you a copy?

## 2014-12-16 NOTE — Telephone Encounter (Signed)
Patient returned call, may be reached at [phone].

## 2014-12-16 NOTE — Telephone Encounter (Signed)
-----   Message from Joellen Jersey sent at 12/16/2014  8:42 AM EST ----- This pt cancelled her appt to qualify her for her 02 with exertion and [facility] needs that info to get her 02 to her

## 2014-12-16 NOTE — Telephone Encounter (Signed)
LMTCB x1 for pt to get r/s.

## 2014-12-16 NOTE — Telephone Encounter (Signed)
Spoke with pt and she states she will take her insurance cards by the pharmacy this afternoon so that they can run the neb solution through her part B. Nothing further needed.

## 2014-12-17 ENCOUNTER — Ambulatory Visit (INDEPENDENT_AMBULATORY_CARE_PROVIDER_SITE_OTHER): Payer: Medicare Other | Admitting: Pulmonary Disease

## 2014-12-30 ENCOUNTER — Ambulatory Visit (INDEPENDENT_AMBULATORY_CARE_PROVIDER_SITE_OTHER): Payer: Medicare Other | Admitting: Pharmacist

## 2014-12-30 DIAGNOSIS — Z5181 Encounter for therapeutic drug level monitoring: Secondary | ICD-10-CM

## 2014-12-30 LAB — POCT INR: INR: 2.3

## 2015-01-03 ENCOUNTER — Other Ambulatory Visit: Payer: Self-pay | Admitting: Internal Medicine

## 2015-01-20 ENCOUNTER — Ambulatory Visit (INDEPENDENT_AMBULATORY_CARE_PROVIDER_SITE_OTHER): Payer: Medicare Other | Admitting: *Deleted

## 2015-01-20 DIAGNOSIS — Z5181 Encounter for therapeutic drug level monitoring: Secondary | ICD-10-CM | POA: Diagnosis not present

## 2015-01-20 LAB — POCT INR: INR: 2.4

## 2015-02-02 ENCOUNTER — Other Ambulatory Visit: Payer: Self-pay | Admitting: Cardiovascular Disease

## 2015-02-17 ENCOUNTER — Ambulatory Visit (INDEPENDENT_AMBULATORY_CARE_PROVIDER_SITE_OTHER): Payer: Medicare Other | Admitting: *Deleted

## 2015-02-17 DIAGNOSIS — Z5181 Encounter for therapeutic drug level monitoring: Secondary | ICD-10-CM

## 2015-02-17 LAB — POCT INR: INR: 3

## 2015-02-24 ENCOUNTER — Telehealth: Payer: Self-pay | Admitting: *Deleted

## 2015-02-24 DIAGNOSIS — J438 Other emphysema: Secondary | ICD-10-CM

## 2015-02-24 NOTE — Telephone Encounter (Signed)
That’s fine with me

## 2015-02-24 NOTE — Telephone Encounter (Signed)
I have placed order to [facility]

## 2015-02-24 NOTE — Telephone Encounter (Signed)
-----   Message from Deno Lunger sent at 02/24/2015  2:13 PM EST ----- Regarding: Rx for OCD Titr Good afternoon Mindy - can you see if Tera Partridge, MD will send [place] a script to Titrate Pt for an Oxygen Conserving Device? If she is able to tolerate the Device it will help Pt's tanks to last longer.  Thanks  Darnelle Spangle [facility]

## 2015-03-05 ENCOUNTER — Other Ambulatory Visit: Payer: Self-pay | Admitting: Cardiovascular Disease

## 2015-03-12 ENCOUNTER — Telehealth: Payer: Self-pay | Admitting: *Deleted

## 2015-03-12 DIAGNOSIS — Z5181 Encounter for therapeutic drug level monitoring: Secondary | ICD-10-CM

## 2015-03-12 DIAGNOSIS — I4891 Unspecified atrial fibrillation: Secondary | ICD-10-CM

## 2015-03-12 MED ORDER — WARFARIN SODIUM 5 MG PO TABS: ORAL_TABLET | ORAL | Status: DC

## 2015-03-12 MED ORDER — WARFARIN SODIUM 1 MG PO TABS
ORAL_TABLET | ORAL | Status: DC
Start: 2015-03-12 — End: 2015-04-03

## 2015-03-12 NOTE — Telephone Encounter (Signed)
Coumadin refill sent in to [facility] for both 5mg  and 1mg  tablets.

## 2015-03-12 NOTE — Telephone Encounter (Signed)
Pt is needing a refill on her Warfarin and stated that the pharmacy told her we denied sending in the refill

## 2015-03-15 ENCOUNTER — Ambulatory Visit (INDEPENDENT_AMBULATORY_CARE_PROVIDER_SITE_OTHER): Payer: Medicare Other | Admitting: *Deleted

## 2015-03-15 DIAGNOSIS — Z5181 Encounter for therapeutic drug level monitoring: Secondary | ICD-10-CM

## 2015-03-15 LAB — POCT INR: INR: 2.1

## 2015-03-16 ENCOUNTER — Encounter: Payer: Self-pay | Admitting: Adult Health

## 2015-03-16 ENCOUNTER — Telehealth: Payer: Self-pay | Admitting: *Deleted

## 2015-03-16 ENCOUNTER — Ambulatory Visit (INDEPENDENT_AMBULATORY_CARE_PROVIDER_SITE_OTHER): Payer: Medicare Other | Admitting: Adult Health

## 2015-03-16 VITALS — BP 104/74 | HR 94 | Temp 98.6°F

## 2015-03-16 DIAGNOSIS — E039 Hypothyroidism, unspecified: Secondary | ICD-10-CM | POA: Diagnosis not present

## 2015-03-16 NOTE — Telephone Encounter (Signed)
Pharmacy says that the pt was there and aware. Refills given.

## 2015-03-16 NOTE — Telephone Encounter (Signed)
Patient states that her Warfarin was called in wrong to pharmacy. Please check on this and advise patient. / tg

## 2015-03-16 NOTE — Progress Notes (Signed)
Subjective:    Patient ID: [name], female    DOB: Apr 01, 1950, 65 y.o.   MRN: [id number]  HPI  65 year old female who presents today for a medication refill for synthroid. She has been out of her medication for the last two days.   She denies any issues with her thyroid. Has no other complaints.    Review of Systems  Respiratory: Positive for shortness of breath (chronic).   Cardiovascular: Negative.   Gastrointestinal: Negative.   Genitourinary: Negative.   Neurological: Negative.   All other systems reviewed and are negative.  Past Medical History  Diagnosis Date  . Arteriosclerotic cardiovascular disease (ASCVD) 2002    Inf STEMI-2002. 2003-cutting balloon + brachytherapy for restenosis; subsequent acute stent thrombosis 06/2010 requiring 2 separate interventions (Zeta stent, then repeat cath with thrombectomy). focal basal inf AK, nl EF; 03/2011: Patent stents, minor nonobst  residual dz, nl EF; neg stress nuclear in 2008 and stress echo in 2009  . Pulmonary embolism ([place]) 2006    Associated with deep vein thrombosis-2006; + factor V Leiden  . Factor V Leiden, prothrombin gene mutation ([place]) 2006  . Hyperlipidemia   . COPD (chronic obstructive pulmonary disease) ([place])     02 dependent  . Tobacco abuse     50 pack years  . Hypothyroidism   . Noncompliance   . Pelvic fracture ([place]) 2009  . Chronic anticoagulation     Warfarin plus ticagrelor  . Acute MI ([place])     x4, code blue x3  . Chronic respiratory failure ([name]) 08/27/2013    On 2L 02    Social History   Social History  . Marital Status: Single    Spouse Name: N/A  . Number of Children: N/A  . Years of Education: N/A   Occupational History  . Disabled   . Truck driver    Social History Main Topics  . Smoking status: Current Every Day Smoker -- 1.00 packs/day for 50 years    Types: Cigarettes    Start date: 05/10/1968  . Smokeless tobacco: Never Used     Comment: 1/2 ppd now 11/26/14  . Alcohol  Use: No  . Drug Use: No  . Sexual Activity: Not on file   Other Topics Concern  . Not on file   Social History Narrative   Originally from [place]. Previously has lived in [place], [place], [place] & moved back to [place] in 1994. 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 Ronie Spies was in her current home. She also had an owl living in her house as a rehab pet. She also had chickens until Fall 2015. No mold exposure.     Past Surgical History  Procedure Laterality Date  . Coronary angioplasty  2002, 2003, 2012  . Colonoscopy  Approximately 2000    Negative screening study  . Left and right heart catheterization with coronary angiogram N/A 04/03/2011    Procedure: LEFT AND RIGHT HEART CATHETERIZATION WITH CORONARY ANGIOGRAM;  Surgeon: Sherren Mocha, MD;  Location: [facility] CATH LAB;  Service: Cardiovascular;  Laterality: N/A;    Family History  Problem Relation Age of Onset  . Emphysema Mother   . Alzheimer's disease Mother   . Emphysema Sister   . Heart disease Father   . Factor V Leiden deficiency Father   . Factor V Leiden deficiency Sister   . Factor V Leiden deficiency Daughter   .  Kidney cancer Paternal Grandmother     Allergies  Allergen Reactions  . Dilaudid [Hydromorphone Hcl] Hives and Nausea Only  . Minocycline Hcl     REACTION: Dizzy  . Prednisone     REACTION: feels like throat swelling  . Varenicline Tartrate     REACTION: Dizzy(chantix)   . Zocor [Simvastatin - High Dose] Other (See Comments)    myalgia    Current Outpatient Prescriptions on File Prior to Visit  Medication Sig Dispense Refill  . albuterol (PROVENTIL) (2.5 MG/3ML) 0.083% nebulizer solution Take 3 mLs (2.5 mg total) by nebulization every 4 (four) hours as needed for wheezing or shortness of breath. 125 vial 12  . budesonide (PULMICORT) 0.5 MG/2ML nebulizer solution Take 2 mLs (0.5 mg total) by nebulization 2 (two) times daily. Dx: J43.9 120 mL  3  . fish oil-omega-3 fatty acids 1000 MG capsule Take 2 g by mouth daily.      [name] ipratropium-albuterol (DUONEB) 0.5-2.5 (3) MG/3ML SOLN 1 vial via nebulizer at breakfast, lunch, dinner and bedtime daily. May take 2 additional treatments on bad day Dx: J43.9 360 mL 3  . levothyroxine (SYNTHROID, LEVOTHROID) 125 MCG tablet take 1 tablet by mouth once daily 30 tablet 1  . Magnesium 500 MG CAPS Take 500 mg by mouth.    . nitroGLYCERIN (NITROSTAT) 0.4 MG SL tablet Place 1 tablet (0.4 mg total) under the tongue every 5 (five) minutes as needed for chest pain. 25 tablet 3  . rosuvastatin (CRESTOR) 40 MG tablet Take 1 tablet (40 mg total) by mouth daily. 90 tablet 1  . ticagrelor (BRILINTA) 60 MG TABS tablet Take 1 tablet (60 mg total) by mouth 2 (two) times daily. 60 tablet 11  . warfarin (COUMADIN) 1 MG tablet Take as directed by Coumadin clinic. 20 tablet 3  . warfarin (COUMADIN) 5 MG tablet Take as directed by the Coumadin Clinic. 30 tablet 3   No current facility-administered medications on file prior to visit.    BP 104/74 mmHg  Pulse 94  Temp(Src) 98.6 F (37 C) (Oral)  SpO2 83%       Objective:   Physical Exam  Constitutional: She is oriented to person, place, and time. She appears well-developed and well-nourished. No distress.  Cardiovascular: Normal rate, regular rhythm, normal heart sounds and intact distal pulses.  Exam reveals no gallop and no friction rub.   No murmur heard. Pulmonary/Chest: Effort normal and breath sounds normal. No respiratory distress. She has no wheezes. She has no rales. She exhibits no tenderness.  On 2 L via [place]   Musculoskeletal: Normal range of motion. She exhibits no edema or tenderness.  Neurological: She is alert and oriented to person, place, and time. She has normal reflexes.  Skin: Skin is warm and dry. No rash noted. She is not diaphoretic. No erythema. No pallor.  Psychiatric: She has a normal mood and affect. Her behavior is normal. Judgment  and thought content normal.  Nursing note and vitals reviewed.     Assessment & Plan:  1. Hypothyroidism, unspecified hypothyroidism type - TSH - Will refill synthroid once level is back. Will adjust as necessary

## 2015-03-16 NOTE — Patient Instructions (Signed)
It was great meeting you today!  I will follow up with you regarding your TSH and we will adjust the medication if needed.   Follow up with me to establish care.

## 2015-03-16 NOTE — Telephone Encounter (Signed)
[facility] called office to confirm coumadin directions/refills for pt. Per anticoagulation appt 3/6, pt takes 5 mg daily and 6 mg on Monday, Wednesday, Friday and f/u in 4 weeks.  Will forward to [employer].

## 2015-03-17 ENCOUNTER — Other Ambulatory Visit: Payer: Self-pay | Admitting: Adult Health

## 2015-03-17 LAB — TSH: TSH: 2.89 u[IU]/mL (ref 0.35–4.50)

## 2015-03-17 MED ORDER — LEVOTHYROXINE SODIUM 125 MCG PO TABS: 125.0000 ug | ORAL_TABLET | Freq: Every day | ORAL | Status: DC

## 2015-04-03 ENCOUNTER — Other Ambulatory Visit: Payer: Self-pay | Admitting: Cardiovascular Disease

## 2015-04-19 ENCOUNTER — Ambulatory Visit (INDEPENDENT_AMBULATORY_CARE_PROVIDER_SITE_OTHER): Payer: Medicare Other | Admitting: *Deleted

## 2015-04-19 DIAGNOSIS — Z5181 Encounter for therapeutic drug level monitoring: Secondary | ICD-10-CM

## 2015-04-19 LAB — POCT INR: INR: 2

## 2015-05-04 ENCOUNTER — Other Ambulatory Visit: Payer: Self-pay | Admitting: Cardiovascular Disease

## 2015-05-14 ENCOUNTER — Encounter: Payer: Self-pay | Admitting: Pulmonary Disease

## 2015-05-14 ENCOUNTER — Ambulatory Visit (INDEPENDENT_AMBULATORY_CARE_PROVIDER_SITE_OTHER): Payer: Medicare Other | Admitting: Pulmonary Disease

## 2015-05-14 VITALS — BP 130/74 | HR 101 | Ht 66.0 in | Wt 195.0 lb

## 2015-05-14 DIAGNOSIS — J439 Emphysema, unspecified: Secondary | ICD-10-CM | POA: Diagnosis not present

## 2015-05-14 DIAGNOSIS — J9611 Chronic respiratory failure with hypoxia: Secondary | ICD-10-CM | POA: Diagnosis not present

## 2015-05-14 DIAGNOSIS — J449 Chronic obstructive pulmonary disease, unspecified: Secondary | ICD-10-CM | POA: Diagnosis not present

## 2015-05-14 MED ORDER — FLUTTER DEVI: Status: AC

## 2015-05-14 NOTE — Patient Instructions (Addendum)
   Continue using your inhalers and medications as prescribed  Congratulations on having quit smoking... Keep it up.  Use your Acapella/Flutter valve twice a day to help break loose the mucus in your lungs. You do this by blowing into it hard 3 times in a row.  Call me if your breathing gets worse before your next appointment.   We will do a breathing test at your next appointment in 6 months  TESTS ORDERED: 1. Spirometry with bronchodilator challenge at next appointment

## 2015-05-14 NOTE — Progress Notes (Signed)
Subjective:    Patient ID: [name], female    DOB: September 08, 1950, 65 y.o.   MRN: [id number]  C.C.:  Follow-up for Very Severe COPD w/ Emphysema, H/O Recurrent Pulmonary Embolus, GERD, Chronic Hypoxic Respiratory Failure, & Ongoing Tobacco Use.  HPI Very Severe COPD w/ Emphysema:  Prescribed Duonebs qid & Budesonide neb bid. She reports her breathing has worsened recently. Cough is intermittently productive of a non-bloody mucus. Does have ongoing wheezing. Compliant with her Duoneb qid & Budesonide bid. She reports she is using her rescue inhaler more frequently lately. Does wake up at night coughing & wheezing.   H/O Recurrent Pulmonary Embolus:  Reported genetic predisoposition with Factor V Leiden. Currently on Coumadin. Last PE around 2006. Father also had a history of blood clots. Coumadin managed by cardiologist Dr. Bronson Ing. INR 2.0 on 04/19/15.  GERD:  She reports intermittent aspiration. She denies any morning brash water taste, dysphagia or reflux.   Chronic Hypoxic Respiratory Failure:  Prescribed oxygen at 2 L/m with exertion at last appointment in addition to 2 L/m while sleeping.  Ongoing Tobacco Use:  Previously has tried Chantix twice but had mood changes. She has also tried nicotine patches but felt nauseated with them. She reports her last cigarette was in November 2016.  Review of Systems Denies any fever but does have some chills & sweats. She does report chest pain & pressure.   Allergies  Allergen Reactions  . Dilaudid [Hydromorphone Hcl] Hives and Nausea Only  . Minocycline Hcl     REACTION: Dizzy  . Prednisone     REACTION: feels like throat swelling  . Varenicline Tartrate     REACTION: Dizzy(chantix)   . Zocor [Simvastatin - High Dose] Other (See Comments)    myalgia   Current Outpatient Prescriptions on File Prior to Visit  Medication Sig Dispense Refill  . albuterol (PROVENTIL) (2.5 MG/3ML) 0.083% nebulizer solution Take 3 mLs (2.5 mg total) by  nebulization every 4 (four) hours as needed for wheezing or shortness of breath. 125 vial 12  . budesonide (PULMICORT) 0.5 MG/2ML nebulizer solution Take 2 mLs (0.5 mg total) by nebulization 2 (two) times daily. Dx: J43.9 120 mL 3  . fentaNYL (DURAGESIC - DOSED MCG/HR) 50 MCG/HR Place 50 mcg onto the skin every 3 (three) days.    . fish oil-omega-3 fatty acids 1000 MG capsule Take 2 g by mouth daily.      [name] ipratropium-albuterol (DUONEB) 0.5-2.5 (3) MG/3ML SOLN 1 vial via nebulizer at breakfast, lunch, dinner and bedtime daily. May take 2 additional treatments on bad day Dx: J43.9 360 mL 3  . levothyroxine (SYNTHROID, LEVOTHROID) 125 MCG tablet Take 1 tablet (125 mcg total) by mouth daily. 90 tablet 1  . Magnesium 500 MG CAPS Take 500 mg by mouth.    . nitroGLYCERIN (NITROSTAT) 0.4 MG SL tablet Place 1 tablet (0.4 mg total) under the tongue every 5 (five) minutes as needed for chest pain. 25 tablet 3  . rosuvastatin (CRESTOR) 40 MG tablet Take 1 tablet (40 mg total) by mouth daily. 90 tablet 1  . ticagrelor (BRILINTA) 60 MG TABS tablet Take 1 tablet (60 mg total) by mouth 2 (two) times daily. 60 tablet 11  . warfarin (COUMADIN) 1 MG tablet Take 1 (1mg ) tablet on Mondays, Wednesdays and Fridays only along with (1) 5mg  tablet 45 tablet 3  . warfarin (COUMADIN) 5 MG tablet Take as directed by the Coumadin Clinic. 30 tablet 3   No current facility-administered medications on  file prior to visit.   Past Medical History  Diagnosis Date  . Arteriosclerotic cardiovascular disease (ASCVD) 2002    Inf STEMI-2002. 2003-cutting balloon + brachytherapy for restenosis; subsequent acute stent thrombosis 06/2010 requiring 2 separate interventions (Zeta stent, then repeat cath with thrombectomy). focal basal inf AK, nl EF; 03/2011: Patent stents, minor nonobst  residual dz, nl EF; neg stress nuclear in 2008 and stress echo in 2009  . Pulmonary embolism ([place]) 2006    Associated with deep vein thrombosis-2006; +  factor V Leiden  . Factor V Leiden, prothrombin gene mutation ([place]) 2006  . Hyperlipidemia   . COPD (chronic obstructive pulmonary disease) ([place])     02 dependent  . Tobacco abuse     50 pack years  . Hypothyroidism   . Noncompliance   . Pelvic fracture ([place]) 2009  . Chronic anticoagulation     Warfarin plus ticagrelor  . Acute MI ([place])     x4, code blue x3  . Chronic respiratory failure ([place]) 08/27/2013    On 2L 02   Past Surgical History  Procedure Laterality Date  . Coronary angioplasty  2002, 2003, 2012  . Colonoscopy  Approximately 2000    Negative screening study  . Left and right heart catheterization with coronary angiogram N/A 04/03/2011    Procedure: LEFT AND RIGHT HEART CATHETERIZATION WITH CORONARY ANGIOGRAM;  Surgeon: Sherren Mocha, MD;  Location: [facility] CATH LAB;  Service: Cardiovascular;  Laterality: N/A;   Family History  Problem Relation Age of Onset  . Emphysema Mother   . Alzheimer's disease Mother   . Emphysema Sister   . Heart disease Father   . Factor V Leiden deficiency Father   . Factor V Leiden deficiency Sister   . Factor V Leiden deficiency Daughter   . Kidney cancer Paternal Grandmother    Social History   Social History  . Marital Status: Single    Spouse Name: N/A  . Number of Children: N/A  . Years of Education: N/A   Occupational History  . Disabled   . Truck driver    Social History Main Topics  . Smoking status: Former Smoker -- 1.00 packs/day for 50 years    Types: Cigarettes    Start date: 05/10/1968    Quit date: 01/10/2015  . Smokeless tobacco: Never Used     Comment: pt unsure of when she quit  . Alcohol Use: No  . Drug Use: No  . Sexual Activity: Not on file   Other Topics Concern  . Not on file   Social History Narrative   Originally from [place]. Previously has lived in [place], [place], [place] & moved back to [place] in 1994. 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 Currently has 2 cats & 3 dogs. Previously owned a Patent attorney. Ronie Spies was in her current home. She also had an owl living in her house as a rehab pet. She also had chickens until Fall 2015. No mold exposure.       Objective:   Physical Exam BP 130/74 mmHg  Pulse 101  Ht 5\' 6"  (1.676 m)  Wt 195 lb (88.451 kg)  BMI 31.49 kg/m2  SpO2 91% General:  No distress. Awake. Alert. Integument:  Warm & dry. No rash on exposed skin.  HEENT:  Moist mucus membranes. No oral ulcers. No scleral injection.   Cardiovascular:  Regular rate.  Regular rhythm. No edema lower extremities.  Pulmonary:  Symmetrically decreased breath sounds. Mildly increased work  of breathing on supplemental oxygen.  Abdomen: Soft. Normal bowel sounds.  Protuberant.  PFT 11/26/14: FVC 0.91 L (26%) FEV1 0.45 L (17%) FEV1/FVC 0.49 FEF 25-75 0.18 L (7%) positive bronchodilator response                                                                   DLCO uncorrected 22% 07/05/11: FVC 1.38 L (42%) FEV1 0.68 L (28%) FEV1/FVC 0.49 FEF 25-75 0.20 L (7%) negative bronchodilator response TLC 4.37 L (83%) RV 142% ERV 61% DLCO uncorrected 39% 05/31/04: FVC 2.14 L (62%) FEV1 1.23 L (47%) FEV1/FVC 0.57 FEF 25-75 0.56 L (19%) negative bronchodilator response TLC 4.78 L (89%) RV 135% ERV 12% DLCO UNcorrected 65%  6MWT 11/26/14:  Walked 48 meters / Baseline Sat 90% on RA / Nadir Sat 88% on RA (heart rate was irregular & pt ambulated for <2min due to dyspnea)  IMAGING CTA CHEST 08/25/13 (per radiologist): No pulmonary embolus. Emphysematous & bronchitic changes. Chronic atelectasis versus scarring left lower lobe. Patchy segmental infiltrate in the posterior right lower lobe. No pleural effusion. Small pericardial effusion.  CARDIAC TTE (04/29/13): Mild LVH. EF 50-55 %. grade 1 diastolic dysfunction. Akinesis of basal-mid inferior myocardium. LA & RA normal in size. RV normal in size and function. No aortic stenosis or regurgitation. Trivial mitral regurgitation. Trivial tricuspid regurgitation.  IVC normal in size. Small pericardial effusion.   LABS 11/25/14 INR:  3.0  09/21/14 Alpha-1 antitrypsin: MM (174)    Assessment & Plan:   65 year old female with underlying very severe COPD. Patient compliant with her nebulizer regimen. I congratulated her on having quit smoking completely. I encouraged her to continue to abstain from tobacco use. I feel given the severity of her lung disease airway clearance would be of great benefit and we did discuss hypertonic saline versus flutter valve for airway clearance. Patient continues to use oxygen as previously prescribed. She is also continuing on systemic anticoagulation with Coumadin. I instructed the patient contact my office if she had any new breathing problems or questions before her next appointment. We will need to reassess her lung function to ensure maximal bronchodilatation at next appointment. I'm hesitant to change the patient's current nebulizer regimen as I do not feel other medications will be as effective in this patient.   1. Very severe COPD W/ Emphysema:  Continuing patient on budesonide twice daily & DuoNeb 4 times a day. Repeat spirometry at next appointment with bronchodilator challenge to ensure maximal bronchodilatation.  2. Chronic Hypoxic Respiratory Failure:   Continuing on oxygen as previously prescribed at 2 L/m with exertion and 2 L/m while sleeping. No changes.  3. History of pulmonary embolus & factor V Leiden:  Currently on Coumadin for systemic anticoagulation. Managed by cardiology.  4. Tobacco use disorder:  Continues to abstain from tobacco use. I congratulated the patient on having quit completely.  5. GERD:  Currently asymptomatic off medication. No changes.  6. Follow-up: Patient to return to clinic in 6 months or sooner if needed.  Sonia Baller Ashok Cordia, M.D. [facility] Pulmonary [facility] Critical Care Pager:  [phone] After 3pm or if no response, call [phone] 3:28 PM 05/14/2015

## 2015-05-17 ENCOUNTER — Ambulatory Visit (INDEPENDENT_AMBULATORY_CARE_PROVIDER_SITE_OTHER): Payer: Medicare Other | Admitting: *Deleted

## 2015-05-17 DIAGNOSIS — Z5181 Encounter for therapeutic drug level monitoring: Secondary | ICD-10-CM

## 2015-05-17 LAB — POCT INR: INR: 2.4

## 2015-05-24 ENCOUNTER — Ambulatory Visit: Payer: Medicare Other | Admitting: Adult Health

## 2015-06-01 ENCOUNTER — Ambulatory Visit (INDEPENDENT_AMBULATORY_CARE_PROVIDER_SITE_OTHER): Payer: Medicare Other | Admitting: Cardiovascular Disease

## 2015-06-01 ENCOUNTER — Encounter: Payer: Self-pay | Admitting: Cardiovascular Disease

## 2015-06-01 VITALS — BP 116/68 | HR 90 | Wt 195.0 lb

## 2015-06-01 DIAGNOSIS — Z79899 Other long term (current) drug therapy: Secondary | ICD-10-CM

## 2015-06-01 DIAGNOSIS — I25118 Atherosclerotic heart disease of native coronary artery with other forms of angina pectoris: Secondary | ICD-10-CM

## 2015-06-01 DIAGNOSIS — D6851 Activated protein C resistance: Secondary | ICD-10-CM

## 2015-06-01 DIAGNOSIS — I6523 Occlusion and stenosis of bilateral carotid arteries: Secondary | ICD-10-CM

## 2015-06-01 DIAGNOSIS — Z7901 Long term (current) use of anticoagulants: Secondary | ICD-10-CM

## 2015-06-01 DIAGNOSIS — I10 Essential (primary) hypertension: Secondary | ICD-10-CM

## 2015-06-01 DIAGNOSIS — I4891 Unspecified atrial fibrillation: Secondary | ICD-10-CM

## 2015-06-01 DIAGNOSIS — J438 Other emphysema: Secondary | ICD-10-CM

## 2015-06-01 DIAGNOSIS — E785 Hyperlipidemia, unspecified: Secondary | ICD-10-CM

## 2015-06-01 DIAGNOSIS — F17200 Nicotine dependence, unspecified, uncomplicated: Secondary | ICD-10-CM

## 2015-06-01 DIAGNOSIS — I2699 Other pulmonary embolism without acute cor pulmonale: Secondary | ICD-10-CM

## 2015-06-01 NOTE — Progress Notes (Signed)
Patient ID: [name], female   DOB: 10-25-1950, 65 y.o.   MRN: [id number]      SUBJECTIVE: The patient presents for routine cardiovascular follow up. She has a history of coronary artery disease with prior PCI of the RCA and has had both in-stent restenosis as well as in-stent thrombosis. She has a history of pulmonary embolism and factor V Leiden deficiency, and is on chronic warfarin therapy. She is also on Brilinta. She has a history of tobacco abuse, obesity, and severe COPD and follows with pulmonary.  She denies exertional chest pain. She has chronic exertional dyspnea due to severe COPD. She uses 2 L of oxygen at night.  Having more difficulty with shortness of breath with rainy and damp weather.   Review of Systems: As per "subjective", otherwise negative.  Allergies  Allergen Reactions  . Dilaudid [Hydromorphone Hcl] Hives and Nausea Only  . Minocycline Hcl     REACTION: Dizzy  . Prednisone     REACTION: feels like throat swelling  . Varenicline Tartrate     REACTION: Dizzy(chantix)   . Zocor [Simvastatin - High Dose] Other (See Comments)    myalgia    Current Outpatient Prescriptions  Medication Sig Dispense Refill  . albuterol (PROVENTIL) (2.5 MG/3ML) 0.083% nebulizer solution Take 3 mLs (2.5 mg total) by nebulization every 4 (four) hours as needed for wheezing or shortness of breath. 125 vial 12  . budesonide (PULMICORT) 0.5 MG/2ML nebulizer solution Take 2 mLs (0.5 mg total) by nebulization 2 (two) times daily. Dx: J43.9 120 mL 3  . fentaNYL (DURAGESIC - DOSED MCG/HR) 50 MCG/HR Place 50 mcg onto the skin every 3 (three) days.    . fish oil-omega-3 fatty acids 1000 MG capsule Take 2 g by mouth daily.      [name] ipratropium-albuterol (DUONEB) 0.5-2.5 (3) MG/3ML SOLN 1 vial via nebulizer at breakfast, lunch, dinner and bedtime daily. May take 2 additional treatments on bad day Dx: J43.9 360 mL 3  . levothyroxine (SYNTHROID, LEVOTHROID) 125 MCG tablet Take 1 tablet  (125 mcg total) by mouth daily. 90 tablet 1  . Magnesium 500 MG CAPS Take 500 mg by mouth.    . nitroGLYCERIN (NITROSTAT) 0.4 MG SL tablet Place 1 tablet (0.4 mg total) under the tongue every 5 (five) minutes as needed for chest pain. 25 tablet 3  . Respiratory Therapy Supplies (FLUTTER) DEVI Use as directed 1 each 0  . ticagrelor (BRILINTA) 60 MG TABS tablet Take 1 tablet (60 mg total) by mouth 2 (two) times daily. 60 tablet 11  . warfarin (COUMADIN) 1 MG tablet Take 1 (1mg ) tablet on Mondays, Wednesdays and Fridays only along with (1) 5mg  tablet 45 tablet 3  . warfarin (COUMADIN) 5 MG tablet Take as directed by the Coumadin Clinic. 30 tablet 3  . rosuvastatin (CRESTOR) 40 MG tablet Take 1 tablet (40 mg total) by mouth daily. (Patient not taking: Reported on 06/01/2015) 90 tablet 1   No current facility-administered medications for this visit.    Past Medical History  Diagnosis Date  . Arteriosclerotic cardiovascular disease (ASCVD) 2002    Inf STEMI-2002. 2003-cutting balloon + brachytherapy for restenosis; subsequent acute stent thrombosis 06/2010 requiring 2 separate interventions (Zeta stent, then repeat cath with thrombectomy). focal basal inf AK, nl EF; 03/2011: Patent stents, minor nonobst  residual dz, nl EF; neg stress nuclear in 2008 and stress echo in 2009  . Pulmonary embolism ([place]) 2006    Associated with deep vein thrombosis-2006; + factor V  Leiden  . Factor V Leiden, prothrombin gene mutation ([place]) 2006  . Hyperlipidemia   . COPD (chronic obstructive pulmonary disease) ([place])     02 dependent  . Tobacco abuse     50 pack years  . Hypothyroidism   . Noncompliance   . Pelvic fracture ([place]) 2009  . Chronic anticoagulation     Warfarin plus ticagrelor  . Acute MI ([place])     x4, code blue x3  . Chronic respiratory failure ([place]) 08/27/2013    On 2L 02    Past Surgical History  Procedure Laterality Date  . Coronary angioplasty  2002, 2003, 2012  . Colonoscopy  Approximately  2000    Negative screening study  . Left and right heart catheterization with coronary angiogram N/A 04/03/2011    Procedure: LEFT AND RIGHT HEART CATHETERIZATION WITH CORONARY ANGIOGRAM;  Surgeon: Sherren Mocha, MD;  Location: [facility] CATH LAB;  Service: Cardiovascular;  Laterality: N/A;    Social History   Social History  . Marital Status: Single    Spouse Name: N/A  . Number of Children: N/A  . Years of Education: N/A   Occupational History  . Disabled   . Truck driver    Social History Main Topics  . Smoking status: Former Smoker -- 1.00 packs/day for 50 years    Types: Cigarettes    Start date: 05/10/1968    Quit date: 01/10/2015  . Smokeless tobacco: Never Used     Comment: pt unsure of when she quit  . Alcohol Use: No  . Drug Use: No  . Sexual Activity: Not on file   Other Topics Concern  . Not on file   Social History Narrative   Originally from [place]. Previously has lived in [place], [place], [place] & moved back to [place] in 1994. 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 Ronie Spies was in her current home. She also had an owl living in her house as a rehab pet. She also had chickens until Fall 2015. No mold exposure.      Filed Vitals:   06/01/15 1331  BP: 116/68  Pulse: 90  Weight: 195 lb (88.451 kg)  SpO2: 96%    PHYSICAL EXAM General: NAD HEENT: Normal. Neck: No JVD, no thyromegaly. Lungs: Prolonged expiratory phase with inspiratory wheezes, no rales, poor air movement. CV: Nondisplaced PMI. Rate at upper normal limits, regular rhythm, normal S1/S2, no S3/S4, no murmur. No pretibial or periankle edema. Stasis dermatitis b/l.  Abdomen: Soft, nontender, obese. Neurologic: Alert and oriented x 3.  Psych: Normal affect. Skin: Stasis dermatitis b/l LE's. Musculoskeletal: No gross deformities. Extremities: + clubbing of fingers.   ECG: Most recent ECG reviewed.      ASSESSMENT AND  PLAN: 1. CAD with history of PCI of the RCA and has had both in-stent restenosis as well as in-stent thrombosis: Stable ischemic heart disease from a symptomatic standpoint. Did not feel Ranexa helped in the past. Continue Brilinta. No longer on rosuvastatin. Will prescribe 40 mg.    2. Essential HTN: Controlled on present therapy. No changes.  3. Dyslipidemia: Had been on Crestor 40 mg daily. Will prescribe again. Lipids on 10/28/14 showed LDL 136, TC 192. I previously arranged for Repatha but she refused.  4. Factor V Leiden mutation: Given her history of pulmonary embolism and genetic mutation, she requires lifelong therapy with warfarin.   5. Carotid artery disease: Less than 50%  stenosis bilaterally in October 2016. Continue statin therapy. Repeat Dopplers in 3 years.  Dispo: f/u 6 months.   Kate Sable, M.D., F.A.C.C.

## 2015-06-01 NOTE — Patient Instructions (Signed)
Your physician wants you to follow-up in: 6 months with Jory Sims NP You will receive a reminder letter in the mail two months in advance. If you don't receive a letter, please call our office to schedule the follow-up appointment.     Please restart Crestor 40 mg daily with dinner   If you need a refill on your cardiac medications before your next appointment, please call your pharmacy.    Thank you for choosing [facility] !

## 2015-06-02 ENCOUNTER — Other Ambulatory Visit: Payer: Self-pay | Admitting: Cardiovascular Disease

## 2015-06-03 ENCOUNTER — Telehealth: Payer: Self-pay | Admitting: Pulmonary Disease

## 2015-06-03 NOTE — Telephone Encounter (Signed)
LMTCB  JN not in office until 6/5.

## 2015-06-04 NOTE — Telephone Encounter (Signed)
Have you tried contacting Dr. Ashok Cordia to fill out paperwork.  Even though he is not scheduled to work in office, he should be available to address this.

## 2015-06-04 NOTE — Telephone Encounter (Signed)
Noted. Dr Ashok Cordia please advise if you are able to stop by the office to fill out DOT paperwork for pt.  This is due back by 6.4.17. Thank you

## 2015-06-04 NOTE — Telephone Encounter (Signed)
VS would you be willing to fill these forms out? Pt needs these filled these out by 06/13/15. JN is not back in the office until 06/14/15. Thanks.

## 2015-06-04 NOTE — Telephone Encounter (Signed)
Can wait until next week

## 2015-06-04 NOTE — Telephone Encounter (Signed)
Spoke with pt. She is needing these forms filled out before JN returns. These forms are in JN's look at.  BQ - would you be willing to fill these forms out?

## 2015-06-04 NOTE — Telephone Encounter (Signed)
lmtcb x2 for pt. 

## 2015-06-08 NOTE — Telephone Encounter (Signed)
Paperwork complete

## 2015-06-08 NOTE — Telephone Encounter (Signed)
Dr. Nestor, please advise. 

## 2015-06-08 NOTE — Telephone Encounter (Signed)
Awaiting call back from pt to advise paperwork completed and faxed.

## 2015-06-08 NOTE — Telephone Encounter (Signed)
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 This has been faxed to the [employer] at [phone] and placed in scan folder. lmomtcb for pt to inform of above.

## 2015-06-09 NOTE — Telephone Encounter (Signed)
Patient [name], informed her that forms have been completed and faxed, patient verbalized understanding, nothing further needed at this time

## 2015-06-16 ENCOUNTER — Other Ambulatory Visit: Payer: Self-pay | Admitting: *Deleted

## 2015-06-16 MED ORDER — LEVOTHYROXINE SODIUM 125 MCG PO TABS: 125.0000 ug | ORAL_TABLET | Freq: Every day | ORAL | Status: DC

## 2015-06-21 ENCOUNTER — Ambulatory Visit (INDEPENDENT_AMBULATORY_CARE_PROVIDER_SITE_OTHER): Payer: Medicare Other | Admitting: *Deleted

## 2015-06-21 DIAGNOSIS — Z5181 Encounter for therapeutic drug level monitoring: Secondary | ICD-10-CM

## 2015-06-21 LAB — POCT INR: INR: 1.7

## 2015-07-02 ENCOUNTER — Other Ambulatory Visit: Payer: Self-pay | Admitting: Cardiovascular Disease

## 2015-08-01 ENCOUNTER — Other Ambulatory Visit: Payer: Self-pay | Admitting: Cardiovascular Disease

## 2015-08-04 ENCOUNTER — Ambulatory Visit (INDEPENDENT_AMBULATORY_CARE_PROVIDER_SITE_OTHER): Payer: Medicare Other | Admitting: *Deleted

## 2015-08-04 ENCOUNTER — Encounter (INDEPENDENT_AMBULATORY_CARE_PROVIDER_SITE_OTHER): Payer: Self-pay

## 2015-08-04 DIAGNOSIS — I4891 Unspecified atrial fibrillation: Secondary | ICD-10-CM | POA: Diagnosis not present

## 2015-08-04 DIAGNOSIS — Z5181 Encounter for therapeutic drug level monitoring: Secondary | ICD-10-CM

## 2015-08-04 LAB — POCT INR: INR: 2

## 2015-08-04 MED ORDER — WARFARIN SODIUM 5 MG PO TABS: ORAL_TABLET | ORAL | 3 refills | Status: DC

## 2015-08-31 ENCOUNTER — Other Ambulatory Visit: Payer: Self-pay | Admitting: Internal Medicine

## 2015-08-31 ENCOUNTER — Other Ambulatory Visit: Payer: Self-pay | Admitting: Cardiovascular Disease

## 2015-09-01 ENCOUNTER — Ambulatory Visit (INDEPENDENT_AMBULATORY_CARE_PROVIDER_SITE_OTHER): Payer: Medicare Other | Admitting: *Deleted

## 2015-09-01 DIAGNOSIS — Z5181 Encounter for therapeutic drug level monitoring: Secondary | ICD-10-CM

## 2015-09-01 LAB — POCT INR: INR: 1.4

## 2015-09-15 ENCOUNTER — Ambulatory Visit (INDEPENDENT_AMBULATORY_CARE_PROVIDER_SITE_OTHER): Payer: Medicare Other | Admitting: *Deleted

## 2015-09-15 DIAGNOSIS — Z5181 Encounter for therapeutic drug level monitoring: Secondary | ICD-10-CM

## 2015-09-15 LAB — POCT INR: INR: 1.9

## 2015-09-30 ENCOUNTER — Other Ambulatory Visit: Payer: Self-pay | Admitting: Internal Medicine

## 2015-09-30 ENCOUNTER — Other Ambulatory Visit: Payer: Self-pay | Admitting: Cardiovascular Disease

## 2015-10-06 ENCOUNTER — Ambulatory Visit (INDEPENDENT_AMBULATORY_CARE_PROVIDER_SITE_OTHER): Payer: Medicare Other | Admitting: *Deleted

## 2015-10-06 DIAGNOSIS — Z23 Encounter for immunization: Secondary | ICD-10-CM | POA: Diagnosis not present

## 2015-10-06 DIAGNOSIS — Z5181 Encounter for therapeutic drug level monitoring: Secondary | ICD-10-CM

## 2015-10-06 LAB — POCT INR: INR: 2

## 2015-10-07 ENCOUNTER — Other Ambulatory Visit: Payer: Self-pay | Admitting: Cardiovascular Disease

## 2015-10-07 DIAGNOSIS — Z5181 Encounter for therapeutic drug level monitoring: Secondary | ICD-10-CM

## 2015-10-07 DIAGNOSIS — I4891 Unspecified atrial fibrillation: Secondary | ICD-10-CM

## 2015-10-08 ENCOUNTER — Telehealth: Payer: Self-pay | Admitting: *Deleted

## 2015-10-08 NOTE — Telephone Encounter (Signed)
Pt would like for you to send a Rx to [facility] in [place] for 5mg  Warfarin

## 2015-10-30 ENCOUNTER — Other Ambulatory Visit: Payer: Self-pay | Admitting: Internal Medicine

## 2015-10-30 ENCOUNTER — Other Ambulatory Visit: Payer: Self-pay | Admitting: Cardiovascular Disease

## 2015-10-30 DIAGNOSIS — I4891 Unspecified atrial fibrillation: Secondary | ICD-10-CM

## 2015-10-30 DIAGNOSIS — Z5181 Encounter for therapeutic drug level monitoring: Secondary | ICD-10-CM

## 2015-11-04 NOTE — Telephone Encounter (Signed)
Please call pt and schedule New Pt appt with [facility]. She was suppose to schedule when she saw him in March. I will send her refill but needs appt.

## 2015-11-04 NOTE — Telephone Encounter (Signed)
Left message to call back  

## 2015-11-10 ENCOUNTER — Ambulatory Visit (INDEPENDENT_AMBULATORY_CARE_PROVIDER_SITE_OTHER): Payer: Medicare Other | Admitting: *Deleted

## 2015-11-10 DIAGNOSIS — Z5181 Encounter for therapeutic drug level monitoring: Secondary | ICD-10-CM | POA: Diagnosis not present

## 2015-11-10 LAB — POCT INR: INR: 1.8

## 2015-11-29 ENCOUNTER — Other Ambulatory Visit: Payer: Self-pay | Admitting: Cardiovascular Disease

## 2015-11-29 ENCOUNTER — Other Ambulatory Visit: Payer: Self-pay | Admitting: Internal Medicine

## 2015-11-29 DIAGNOSIS — I4891 Unspecified atrial fibrillation: Secondary | ICD-10-CM

## 2015-11-29 DIAGNOSIS — Z5181 Encounter for therapeutic drug level monitoring: Secondary | ICD-10-CM

## 2015-12-06 ENCOUNTER — Other Ambulatory Visit (HOSPITAL_COMMUNITY)
Admission: RE | Admit: 2015-12-06 | Discharge: 2015-12-06 | Disposition: A | Discharge status: Home / Self Care | Payer: Medicare Other | Source: Ambulatory Visit | Attending: Adult Health | Admitting: Adult Health

## 2015-12-06 ENCOUNTER — Telehealth: Payer: Self-pay | Admitting: *Deleted

## 2015-12-06 ENCOUNTER — Ambulatory Visit (INDEPENDENT_AMBULATORY_CARE_PROVIDER_SITE_OTHER): Payer: Medicare Other | Admitting: Adult Health

## 2015-12-06 ENCOUNTER — Encounter: Payer: Self-pay | Admitting: Adult Health

## 2015-12-06 ENCOUNTER — Ambulatory Visit (INDEPENDENT_AMBULATORY_CARE_PROVIDER_SITE_OTHER): Payer: Medicare Other | Admitting: *Deleted

## 2015-12-06 VITALS — BP 138/78 | HR 97 | Ht 66.0 in | Wt 187.0 lb

## 2015-12-06 DIAGNOSIS — Z5181 Encounter for therapeutic drug level monitoring: Secondary | ICD-10-CM

## 2015-12-06 DIAGNOSIS — I2510 Atherosclerotic heart disease of native coronary artery without angina pectoris: Secondary | ICD-10-CM | POA: Diagnosis not present

## 2015-12-06 DIAGNOSIS — R319 Hematuria, unspecified: Secondary | ICD-10-CM | POA: Insufficient documentation

## 2015-12-06 DIAGNOSIS — R531 Weakness: Secondary | ICD-10-CM

## 2015-12-06 DIAGNOSIS — E7800 Pure hypercholesterolemia, unspecified: Secondary | ICD-10-CM

## 2015-12-06 LAB — URINE MICROSCOPIC-ADD ON

## 2015-12-06 LAB — CBC WITH DIFFERENTIAL/PLATELET
BASOS ABS: 0 10*3/uL (ref 0.0–0.1)
BASOS PCT: 0 %
EOS ABS: 0.1 10*3/uL (ref 0.0–0.7)
EOS PCT: 1 %
HCT: 40.7 % (ref 36.0–46.0)
Hemoglobin: 12.3 g/dL (ref 12.0–15.0)
LYMPHS PCT: 19 %
Lymphs Abs: 1.2 10*3/uL (ref 0.7–4.0)
MCH: 29.6 pg (ref 26.0–34.0)
MCHC: 30.2 g/dL (ref 30.0–36.0)
MCV: 97.8 fL (ref 78.0–100.0)
MONO ABS: 0.8 10*3/uL (ref 0.1–1.0)
Monocytes Relative: 12 %
Neutro Abs: 4.3 10*3/uL (ref 1.7–7.7)
Neutrophils Relative %: 68 %
PLATELETS: 254 10*3/uL (ref 150–400)
RBC: 4.16 MIL/uL (ref 3.87–5.11)
RDW: 15.2 % (ref 11.5–15.5)
WBC: 6.5 10*3/uL (ref 4.0–10.5)

## 2015-12-06 LAB — BASIC METABOLIC PANEL
ANION GAP: 6 (ref 5–15)
BUN: 17 mg/dL (ref 6–20)
CALCIUM: 8.5 mg/dL — AB (ref 8.9–10.3)
CO2: 31 mmol/L (ref 22–32)
Chloride: 101 mmol/L (ref 101–111)
Creatinine, Ser: 0.9 mg/dL (ref 0.44–1.00)
GFR calc Af Amer: >60 mL/min (ref >60)
GLUCOSE: 96 mg/dL (ref 65–99)
POTASSIUM: 3.6 mmol/L (ref 3.5–5.1)
SODIUM: 138 mmol/L (ref 135–145)

## 2015-12-06 LAB — URINALYSIS, ROUTINE W REFLEX MICROSCOPIC
Bilirubin Urine: NEGATIVE
GLUCOSE, UA: NEGATIVE mg/dL
LEUKOCYTES UA: NEGATIVE
Nitrite: NEGATIVE
PROTEIN: 100 mg/dL — AB
Specific Gravity, Urine: 1.02 (ref 1.005–1.030)
pH: 6.5 (ref 5.0–8.0)

## 2015-12-06 LAB — TSH: TSH: 32.853 u[IU]/mL — AB (ref 0.350–4.500)

## 2015-12-06 LAB — POCT INR: INR: 3.3

## 2015-12-06 MED ORDER — ATORVASTATIN CALCIUM 40 MG PO TABS: 40.0000 mg | ORAL_TABLET | Freq: Every day | ORAL | 6 refills | Status: DC

## 2015-12-06 NOTE — Progress Notes (Signed)
Name: [name]    DOB: [birth year]  Age: 65 y.o.  MR#: [id number]       PCP:  Drema Pry, DO      Insurance: Payor: MEDICARE / Plan: MEDICARE PART A AND B / Product Type: *No Product type* /   CC:    Chief Complaint  Patient presents with  . Coronary Artery Disease  . Hyperlipidemia    VS Vitals:   12/06/15 1415  BP: 138/78  Pulse: 97  SpO2: 93%  Weight: 187 lb (84.8 kg)  Height: 5\' 6"  (1.676 m)    Weights Current Weight  12/06/15 187 lb (84.8 kg)  06/01/15 195 lb (88.5 kg)  05/14/15 195 lb (88.5 kg)    Blood Pressure  BP Readings from Last 3 Encounters:  12/06/15 138/78  06/01/15 116/68  05/14/15 130/74     Admit date:  (Not on file) Last encounter with RMR:  Visit date not found   Allergy Dilaudid [hydromorphone hcl]; Minocycline hcl; Prednisone; Varenicline tartrate; and Zocor [simvastatin - high dose]  Current Outpatient Prescriptions  Medication Sig Dispense Refill  . albuterol (PROVENTIL) (2.5 MG/3ML) 0.083% nebulizer solution Take 3 mLs (2.5 mg total) by nebulization every 4 (four) hours as needed for wheezing or shortness of breath. 125 vial 12  . budesonide (PULMICORT) 0.5 MG/2ML nebulizer solution Take 2 mLs (0.5 mg total) by nebulization 2 (two) times daily. Dx: J43.9 120 mL 3  . fentaNYL (DURAGESIC - DOSED MCG/HR) 50 MCG/HR Place 50 mcg onto the skin every 3 (three) days.    . fish oil-omega-3 fatty acids 1000 MG capsule Take 2 g by mouth daily.      [name] ipratropium-albuterol (DUONEB) 0.5-2.5 (3) MG/3ML SOLN 1 vial via nebulizer at breakfast, lunch, dinner and bedtime daily. May take 2 additional treatments on bad day Dx: J43.9 360 mL 3  . levothyroxine (SYNTHROID, LEVOTHROID) 125 MCG tablet Take 1 tablet (125 mcg total) by mouth daily. 90 tablet 1  . levothyroxine (SYNTHROID, LEVOTHROID) 125 MCG tablet take 1 tablet by mouth once daily 30 tablet 1  . levothyroxine (SYNTHROID, LEVOTHROID) 125 MCG tablet take 1 tablet by mouth once daily 30 tablet 0  .  Magnesium 500 MG CAPS Take 500 mg by mouth.    . nitroGLYCERIN (NITROSTAT) 0.4 MG SL tablet Place 1 tablet (0.4 mg total) under the tongue every 5 (five) minutes as needed for chest pain. 25 tablet 3  . Respiratory Therapy Supplies (FLUTTER) DEVI Use as directed 1 each 0  . ticagrelor (BRILINTA) 60 MG TABS tablet Take 1 tablet (60 mg total) by mouth 2 (two) times daily. 60 tablet 11  . warfarin (COUMADIN) 1 MG tablet Take 1 tablet daily (1mg ) along with 1 (5mg ) tablet daily except on Tuesdays and Fridays only take 5mg  tablet 45 tablet 3  . warfarin (COUMADIN) 5 MG tablet take 1 tablet by mouth once daily 30 tablet 6   No current facility-administered medications for this visit.     Discontinued Meds:    Medications Discontinued During This Encounter  Medication Reason  . rosuvastatin (CRESTOR) 40 MG tablet Error    Patient Active Problem List   Diagnosis Date Noted  . Dyspnea 11/26/2014  . Loss of weight 11/26/2014  . Acute bronchitis 11/03/2013  . Chronic respiratory failure ([place]) 08/27/2013  . Pneumonia 08/26/2013  . Chest pain 08/24/2013  . Encounter for therapeutic drug monitoring 02/13/2013  . Abnormal weight loss 03/21/2012  . Cerebrovascular disease 11/15/2011  . Arteriosclerotic cardiovascular disease (  ASCVD)   . Factor V Leiden, prothrombin gene mutation ([place])   . Hyperlipidemia   . Chronic anticoagulation   . Hypothyroidism 11/23/2006  . TOBACCO ABUSE 11/23/2006  . 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/23/2006    LABS    Component Value Date/Time   NA 139 03/30/2014 1532   NA 141 08/27/2013 0558   NA 141 08/26/2013 0211   K 4.3 03/30/2014 1532   K 4.2 08/27/2013 0558   K 4.4 08/26/2013 0211   CL 104 03/30/2014 1532   CL 102 08/27/2013 0558   CL 101 08/26/2013 0211   CO2 31 03/30/2014 1532   CO2 31 08/27/2013 0558   CO2 29 08/26/2013 0211   GLUCOSE 90 03/30/2014 1532   GLUCOSE 99 08/27/2013  0558   GLUCOSE 155 (H) 08/26/2013 0211   BUN 20 03/30/2014 1532   BUN 21 08/27/2013 0558   BUN 20 08/26/2013 0211   CREATININE 0.94 03/30/2014 1532   CREATININE 0.97 08/27/2013 0558   CREATININE 1.02 08/26/2013 0211   CALCIUM 9.3 03/30/2014 1532   CALCIUM 8.2 (L) 08/27/2013 0558   CALCIUM 9.0 08/26/2013 0211   GFRNONAA 61 (L) 08/27/2013 0558   GFRNONAA 57 (L) 08/26/2013 0211   GFRNONAA 57 (L) 08/25/2013 1640   GFRAA 71 (L) 08/27/2013 0558   GFRAA 66 (L) 08/26/2013 0211   GFRAA 66 (L) 08/25/2013 1640   CMP     Component Value Date/Time   NA 139 03/30/2014 1532   K 4.3 03/30/2014 1532   CL 104 03/30/2014 1532   CO2 31 03/30/2014 1532   GLUCOSE 90 03/30/2014 1532   BUN 20 03/30/2014 1532   CREATININE 0.94 03/30/2014 1532   CALCIUM 9.3 03/30/2014 1532   PROT 7.3 03/30/2014 1532   ALBUMIN 3.8 03/30/2014 1532   AST 17 03/30/2014 1532   ALT 17 03/30/2014 1532   ALKPHOS 113 03/30/2014 1532   BILITOT 0.4 03/30/2014 1532   GFRNONAA 61 (L) 08/27/2013 0558   GFRAA 71 (L) 08/27/2013 0558       Component Value Date/Time   WBC 9.4 03/30/2014 1532   WBC 10.5 08/27/2013 0558   WBC 10.4 08/26/2013 0211   HGB 15.6 (H) 03/30/2014 1532   HGB 12.8 08/27/2013 0558   HGB 14.3 08/26/2013 0211   HCT 46.6 (H) 03/30/2014 1532   HCT 39.5 08/27/2013 0558   HCT 43.3 08/26/2013 0211   MCV 88.6 03/30/2014 1532   MCV 94.3 08/27/2013 0558   MCV 93.3 08/26/2013 0211    Lipid Panel     Component Value Date/Time   CHOL 193 10/28/2014 1212   TRIG 137 10/28/2014 1212   HDL 30 (L) 10/28/2014 1212   CHOLHDL 6.4 (H) 10/28/2014 1212   VLDL 27 10/28/2014 1212   LDLCALC 136 (H) 10/28/2014 1212   LDLDIRECT 172.6 04/01/2008 1205    ABG    Component Value Date/Time   PHART 7.343 (L) 08/25/2013 1825   PCO2ART 57.2 (HH) 08/25/2013 1825   PO2ART 78.0 (L) 08/25/2013 1825   HCO3 30.3 (H) 08/25/2013 1825   TCO2 26.8 08/25/2013 1825   ACIDBASEDEF 1.4 06/23/2010 0407   O2SAT 95.6 08/25/2013 1825      Lab Results  Component Value Date   TSH 2.89 03/16/2015   BNP (last 3 results) No results for input(s): BNP in the last 8760 hours.  ProBNP (last 3 results) No results for input(s): PROBNP in the last 8760 hours.  Cardiac Panel (last  3 results) No results for input(s): CKTOTAL, CKMB, TROPONINI, RELINDX in the last 72 hours.  Iron/TIBC/Ferritin/ %Sat No results found for: IRON, TIBC, FERRITIN, IRONPCTSAT   EKG Orders placed or performed in visit on 12/06/15  . EKG 12-Lead     Prior Assessment and Plan Problem List as of 12/06/2015 Reviewed: 06/01/2015  1:47 PM by Kate Sable, MD     Cardiovascular and Mediastinum   PULMONARY EMBOLISM   Last Assessment & Plan 09/04/2011 Office Visit Written 09/04/2011 10:48 PM by Rosine Abe, DO    Warfarin management as per cardiology      Arteriosclerotic cardiovascular disease (ASCVD)   Last Assessment & Plan 05/01/2013 Office Visit Written 05/01/2013  4:07 PM by Lendon Colonel, NP    I will will not plan any further cardiac testing. I have given her a lot of reassurance concerning or echocardiogram. I believe she may have misunderstood her instructions concerning the echocardiogram results that were called from the CVTS office.   She is reassured that there is no evidence of a blood clot in her heart per echocardiogram. She remains on Coumadin in Brilinta. I will continue her on Ranexa. We will see her again in 6 months unless she becomes symptomatic. She verbalizes understanding and appears to be relieved of her anxiety concerning her echocardiogram report.      Cerebrovascular disease     Respiratory   COPD (chronic obstructive pulmonary disease) with emphysema [facility])   Last Assessment & Plan 05/05/2014 Office Visit Written 05/05/2014  2:58 PM by Kathee Delton, MD    The patient continues to have ongoing issues with dyspnea on exertion, and I have explained this is not surprising given the severity of her lung disease, as  well as her obesity and deconditioning. She felt that her inhalers were not working for her, and we therefore changed her over to nebulizer treatments. I suspect she is getting the shakiness and headaches from overusing her rescue medication on top of her albuterol nebulizer treatments. I have asked her not to use her rescue inhaler Intal she has settled down for at least 5 minutes to see if her breathing normalizes. She tells me that she is not smoking, and I hope things improve going forward. Perhaps we can go back to inhaled maintenance medications once she has been off cigarettes for a longer period of time. I have asked her again about pulmonary rehabilitation, and she is unable to afford.      Pneumonia   Chronic respiratory failure ([place])   Acute bronchitis   Last Assessment & Plan 11/03/2013 Office Visit Written 11/03/2013  3:35 PM by Kathee Delton, MD    The patient is coughing up purulent mucus and has chest congestion, and we'll therefore treat her with a course of antibiotics. I have stressed to her again the importance of total smoking cessation.        Endocrine   Hypothyroidism   Last Assessment & Plan 03/30/2014 Office Visit Written 03/30/2014 10:14 PM by Rosine Abe, DO    Monitor TFTs.  Adjust levothyroxine dose accordingly.        Hematopoietic and Hemostatic   Factor V Leiden, prothrombin gene mutation [facility])   Last Assessment & Plan 05/01/2013 Office Visit Written 05/01/2013  4:08 PM by Lendon Colonel, NP    He will continue on Coumadin and antiplatelet therapy with Brilinta.        Other   TOBACCO ABUSE   Last Assessment &  Plan 05/01/2013 Office Visit Written 05/01/2013  4:08 PM by Lendon Colonel, NP    Have again spoken with her about smoking cessation. I am unhappy there but she restarted. She is going to try and put them down again.      Hyperlipidemia   Last Assessment & Plan 03/30/2014 Office Visit Written 03/30/2014 10:14 PM by Rosine Abe, DO     Monitor lipid panel and LFTs.  Take Crestor on a regular basis.      Chronic anticoagulation   Abnormal weight loss   Last Assessment & Plan 03/21/2012 Office Visit Written 03/21/2012  4:51 PM by Rosine Abe, DO    65 year old white female with history of tobacco use complains of unexplained weight loss or anorexia. Symptoms concerning for  lung cancer. Obtain CT of chest with contrast.      Encounter for therapeutic drug monitoring   Chest pain   Dyspnea   Loss of weight       Imaging: No results found.

## 2015-12-06 NOTE — Telephone Encounter (Signed)
-----   Message from Lendon Colonel, NP sent at 12/06/2015  4:26 PM EST ----- TSH extremely elevated. She states she is taking her thyroid medication but it does not appear that she is on a correct dose or that she is not taking it consistently. She will need to make an appointment with her primary care physician as soon as possible. Possible referral to endocrinologist. Please call Dr. Lora Havens office to inform.

## 2015-12-06 NOTE — Telephone Encounter (Signed)
Called patient with test results. No answer. Left message to call back.  

## 2015-12-06 NOTE — Progress Notes (Signed)
Cardiology Office Note   Date:  12/06/2015   ID:  [name], DOB 1950/02/07, MRN [id number]  PCP:  Drema Pry, DO  Cardiologist: Woodroe Chen, NP   Chief Complaint  Patient presents with  . Coronary Artery Disease  . Hyperlipidemia      History of Present Illness: [name] is a 65 y.o. 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 She also has a history of ongoing tobacco abuse COPD and obesity. She has chronic dyspnea on exertion and is on oxygen 2 L at nighttime.  On last office visit the patient was started on statin therapy, rosuvastatin 40 mg daily. She remained on anticoagulation therapy and is being followed in our Coumadin clinic.  She comes today with complaints of dysuria and hematuria. She is now on oxygen 24 7. She has not been seen by her primary care physician in approximately one year as they continue to reschedule her. She denies chest pain palpitations or fatigue. Her main complaint is chronic dyspnea, and sometimes rapid heart rate if she overexerts herself. She has been seen in our Coumadin clinic today with medication adjustment.   Past Medical History:  Diagnosis Date  . Acute MI    x4, code blue x3  . Arteriosclerotic cardiovascular disease (ASCVD) 2002   Inf STEMI-2002. 2003-cutting balloon + brachytherapy for restenosis; subsequent acute stent thrombosis 06/2010 requiring 2 separate interventions (Zeta stent, then repeat cath with thrombectomy). focal basal inf AK, nl EF; 03/2011: Patent stents, minor nonobst  residual dz, nl EF; neg stress nuclear in 2008 and stress echo in 2009  . Chronic anticoagulation    Warfarin plus ticagrelor  . Chronic respiratory failure ([place]) 08/27/2013   On 2L 02  . COPD (chronic  obstructive pulmonary disease) ([place])    02 dependent  . Factor V Leiden, prothrombin gene mutation ([place]) 2006  . Hyperlipidemia   . Hypothyroidism   . Noncompliance   . Pelvic fracture ([place]) 2009  . Pulmonary embolism ([place]) 2006   Associated with deep vein thrombosis-2006; + factor V Leiden  . Tobacco abuse    50 pack years    Past Surgical History:  Procedure Laterality Date  . COLONOSCOPY  Approximately 2000   Negative screening study  . CORONARY ANGIOPLASTY  2002, 2003, 2012  . LEFT AND RIGHT HEART CATHETERIZATION WITH CORONARY ANGIOGRAM N/A 04/03/2011   Procedure: LEFT AND RIGHT HEART CATHETERIZATION WITH CORONARY ANGIOGRAM;  Surgeon: Sherren Mocha, MD;  Location: [facility] CATH LAB;  Service: Cardiovascular;  Laterality: N/A;     Current Outpatient Prescriptions  Medication Sig Dispense Refill  . albuterol (PROVENTIL) (2.5 MG/3ML) 0.083% nebulizer solution Take 3 mLs (2.5 mg total) by nebulization every 4 (four) hours as needed for wheezing or shortness of breath. 125 vial 12  . budesonide (PULMICORT) 0.5 MG/2ML nebulizer solution Take 2 mLs (0.5 mg total) by nebulization 2 (two) times daily. Dx: J43.9 120 mL 3  . fentaNYL (DURAGESIC - DOSED MCG/HR) 50 MCG/HR Place 50 mcg onto the skin every 3 (three) days.    . fish oil-omega-3 fatty acids 1000 MG capsule Take 2 g by mouth daily.      [name] ipratropium-albuterol (DUONEB) 0.5-2.5 (3) MG/3ML SOLN 1 vial via nebulizer at breakfast, lunch, dinner and bedtime daily. May take 2 additional treatments on  bad day Dx: J43.9 360 mL 3  . levothyroxine (SYNTHROID, LEVOTHROID) 125 MCG tablet Take 1 tablet (125 mcg total) by mouth daily. 90 tablet 1  . levothyroxine (SYNTHROID, LEVOTHROID) 125 MCG tablet take 1 tablet by mouth once daily 30 tablet 1  . levothyroxine (SYNTHROID, LEVOTHROID) 125 MCG tablet take 1 tablet by mouth once daily 30 tablet 0  . Magnesium 500 MG CAPS Take 500 mg by mouth.    . nitroGLYCERIN (NITROSTAT) 0.4 MG SL tablet Place 1  tablet (0.4 mg total) under the tongue every 5 (five) minutes as needed for chest pain. 25 tablet 3  . Respiratory Therapy Supplies (FLUTTER) DEVI Use as directed 1 each 0  . ticagrelor (BRILINTA) 60 MG TABS tablet Take 1 tablet (60 mg total) by mouth 2 (two) times daily. 60 tablet 11  . warfarin (COUMADIN) 1 MG tablet Take 1 tablet daily (1mg ) along with 1 (5mg ) tablet daily except on Tuesdays and Fridays only take 5mg  tablet 45 tablet 3  . warfarin (COUMADIN) 5 MG tablet take 1 tablet by mouth once daily 30 tablet 6   No current facility-administered medications for this visit.     Allergies:   Dilaudid [hydromorphone hcl]; Minocycline hcl; Prednisone; Varenicline tartrate; and Zocor [simvastatin - high dose]    Social History:  The patient  reports that she quit smoking about 10 months ago. Her smoking use included Cigarettes. She started smoking about 47 years ago. She has a 50.00 pack-year smoking history. She has never used smokeless tobacco. She reports that she does not drink alcohol or use drugs.   Family History:  The patient's family history includes Alzheimer's disease in her mother; Emphysema in her mother and sister; Factor V Leiden deficiency in her daughter, father, and sister; Heart disease in her father; Kidney cancer in her paternal grandmother.    ROS: All other systems are reviewed and negative. Unless otherwise mentioned in H&P    PHYSICAL EXAM: VS:  BP 138/78   Pulse 97   Ht 5\' 6"  (1.676 m)   Wt 187 lb (84.8 kg)   SpO2 93% Comment: 2L O2  BMI 30.18 kg/m  , BMI Body mass index is 30.18 kg/m. GEN: Well nourished, well developed, in no acute distress  HEENT: normal  Neck: no JVD, carotid bruits, or masses Cardiac: IRRR; tachycardic, no murmurs, rubs, or gallops,no edema  Respiratory:  clear to auscultation bilaterally, slight increased work of breathing, wearing oxygen 2 L via portable tank. GI: soft, nontender, nondistended, + BS MS: no deformity or atrophy   Skin: warm and dry, no rash Neuro:  Strength and sensation are intact Psych: euthymic mood, full affect  Recent Labs: 03/16/2015: TSH 2.89    Lipid Panel    Component Value Date/Time   CHOL 193 10/28/2014 1212   TRIG 137 10/28/2014 1212   HDL 30 (L) 10/28/2014 1212   CHOLHDL 6.4 (H) 10/28/2014 1212   VLDL 27 10/28/2014 1212   LDLCALC 136 (H) 10/28/2014 1212   LDLDIRECT 172.6 04/01/2008 1205      Wt Readings from Last 3 Encounters:  12/06/15 187 lb (84.8 kg)  06/01/15 195 lb (88.5 kg)  05/14/15 195 lb (88.5 kg)      ASSESSMENT AND PLAN:  1.  Coronary artery disease: She is currently stable and asymptomatic from a cardiac standpoint. No changes in her medication regimen. May consider adding low-dose heart rate control as her heart rate is elevated at rest. She states that it does that when  she exerts herself. We'll continue to follow this. In the interim she will continue on Brilinta, and statin therapy. Check BMET.  2. Atrial fibrillation: Heart rate is slightly elevated today. May consider adding rate reducing medication if she remains elevated. For now continue Coumadin therapy with medication adjustments based on INR status. Check CBC.  3. Dysuria with hematuria: Self-reported. We'll check a UA, and CNS.  4. Thyroid disease: She has not been seen by primary care in over a year. We'll check TSH.  5. Hypercholesterolemia: She has run out of Lipitor and did not refill it. Her provide new prescription. She will need follow-up lipids and LFTs in 6 weeks.   Current medicines are reviewed at length with the patient today.    Labs/ tests ordered today include:   Orders Placed This Encounter  Procedures  . EKG 12-Lead     Disposition:   FU with 6 months unless symptomatic. Signed, Jory Sims, NP  12/06/2015 2:23 PM    [facility] [address], [place], [place] [address] Phone: [phone]; Fax: [phone]

## 2015-12-06 NOTE — Patient Instructions (Signed)
Your physician wants you to follow-up in: 6 Months with Dr. Dwana Curd. You will receive a reminder letter in the mail two months in advance. If you don't receive a letter, please call our office to schedule the follow-up appointment.  Your physician recommends that you continue on your current medications as directed. Please refer to the Current Medication list given to you today.  Start Lipitor 40 mg Daily   Your physician recommends that you have lab work done today.  If you need a refill on your cardiac medications before your next appointment, please call your pharmacy.  Thank you for choosing [facility]!

## 2015-12-07 ENCOUNTER — Telehealth: Payer: Self-pay | Admitting: *Deleted

## 2015-12-07 MED ORDER — CIPROFLOXACIN HCL 500 MG PO TABS: 500.0000 mg | ORAL_TABLET | Freq: Two times a day (BID) | ORAL | 0 refills | Status: DC

## 2015-12-07 NOTE — Telephone Encounter (Signed)
-----   Message from Lendon Colonel, NP sent at 12/07/2015  7:04 AM EST ----- Called patient yesterday to let her know to contact PCP about elevated TSH. Left message on her cell phone voicemail. She she has UTI. Awaiting culture. Start Cipro 500 mg BID for 10 days for now. May need to change if culture does not show sensitivity. Please confirm with the patient that she got our first message about see PCP about TSH level. Thank you.

## 2015-12-08 ENCOUNTER — Other Ambulatory Visit: Payer: Self-pay | Admitting: Pulmonary Disease

## 2015-12-09 LAB — URINE CULTURE

## 2015-12-10 ENCOUNTER — Telehealth: Payer: Self-pay | Admitting: *Deleted

## 2015-12-10 NOTE — Telephone Encounter (Signed)
Called patient with test results. No answer. Left message to call back.  

## 2015-12-10 NOTE — Telephone Encounter (Signed)
-----   Message from Lendon Colonel, NP sent at 12/10/2015  6:58 AM EST ----- Has been started on Cipro for klesiella pneumonia with Cipro. Will need to follow up with PCP.

## 2016-01-19 ENCOUNTER — Ambulatory Visit (INDEPENDENT_AMBULATORY_CARE_PROVIDER_SITE_OTHER): Payer: Medicare Other | Admitting: *Deleted

## 2016-01-19 DIAGNOSIS — Z5181 Encounter for therapeutic drug level monitoring: Secondary | ICD-10-CM | POA: Diagnosis not present

## 2016-01-19 LAB — POCT INR: INR: 3.9

## 2016-01-20 ENCOUNTER — Telehealth: Payer: Self-pay | Admitting: Pulmonary Disease

## 2016-01-20 MED ORDER — ALBUTEROL SULFATE (2.5 MG/3ML) 0.083% IN NEBU: INHALATION_SOLUTION | RESPIRATORY_TRACT | 2 refills | Status: DC

## 2016-01-20 NOTE — Telephone Encounter (Signed)
Spoke with pt. And informed her of JN message. She understood. Nothing further is needed. Her rx was sent to her pharmacy of choice.

## 2016-01-20 NOTE — Telephone Encounter (Signed)
Pt refusing OV due to it being flu season, patient wants to post pone her follow up until after flu season, maybe late March or early April. Pt was advised that we will refill her Albuterol until March but she will need to make an appt to follow up with Dr Ashok Cordia.   Please advise if you are okay with her waiting this long as you wanted her to have follow up Spirometry. Pt last seen in May 2017.

## 2016-01-20 NOTE — Telephone Encounter (Signed)
We can postpone it but her insurance may not cover the Albuterol. Make sure she has testing scheduled the same day as her follow-up.

## 2016-01-24 ENCOUNTER — Telehealth: Payer: Self-pay | Admitting: Pulmonary Disease

## 2016-01-24 NOTE — Telephone Encounter (Signed)
lmomtcb x 2  

## 2016-01-24 NOTE — Telephone Encounter (Signed)
lmomtcb x1 

## 2016-01-24 NOTE — Telephone Encounter (Signed)
Patient returning call regarding paperwork to be sent to [facility] in [place] for albuterol - She can be reached at [phone] -pr

## 2016-01-25 NOTE — Telephone Encounter (Signed)
Form has been received and placed in JN's look at. Message will be routed to myself as I am working with him in the AM. Pt is aware that JN is not back in the office until tomorrow. She verbalized understanding.

## 2016-01-25 NOTE — Telephone Encounter (Signed)
Patient returned call, CB [phone].  States paperwork may be under the name of "[name]" Salts.  States the pharmacy has her listed as "[name]".  States the paperwork states Medicare Part B written order and pharmacy is faxing this over again this morning.

## 2016-01-25 NOTE — Telephone Encounter (Signed)
Pharmacy stated to patient they are waiting for the nurse at the office to fax over paperwork. Patient stated she is completely out of her medication and request for the medication today.

## 2016-01-25 NOTE — Telephone Encounter (Signed)
Checked fax machine. Fax has not been received yet. Will continue to check fax machine.

## 2016-01-31 NOTE — Telephone Encounter (Signed)
lmtcb for pt. Office was closed on 1/17 and 1/18 when JN was scheduled to be in office.

## 2016-02-01 NOTE — Telephone Encounter (Signed)
Noted  

## 2016-02-01 NOTE — Telephone Encounter (Signed)
Called and spoke to pt. Form for her albuterol neb has been faxed back to pharmacy. Appt made with JN for 03/2016, pt states she did not want to come in any sooner because it is in the middle of flu season. Pt aware to keep this appt. Nothing further needed at this time.

## 2016-02-02 ENCOUNTER — Telehealth: Payer: Self-pay | Admitting: Pulmonary Disease

## 2016-02-02 NOTE — Telephone Encounter (Signed)
Spoke with pt and made her aware that they last call noted from [place] was on 01/20/16, and pt was made aware of JN's recommendations. I'm not seeing where jasmine has contacted pt more recent then that. Pt states she is unsure how how old the message was. Nothing further needed.

## 2016-02-09 ENCOUNTER — Ambulatory Visit (INDEPENDENT_AMBULATORY_CARE_PROVIDER_SITE_OTHER): Payer: Medicare Other | Admitting: *Deleted

## 2016-02-09 DIAGNOSIS — Z5181 Encounter for therapeutic drug level monitoring: Secondary | ICD-10-CM | POA: Diagnosis not present

## 2016-02-09 LAB — POCT INR: INR: 2.6

## 2016-03-13 ENCOUNTER — Ambulatory Visit (INDEPENDENT_AMBULATORY_CARE_PROVIDER_SITE_OTHER): Payer: Medicare Other | Admitting: *Deleted

## 2016-03-13 DIAGNOSIS — Z5181 Encounter for therapeutic drug level monitoring: Secondary | ICD-10-CM

## 2016-03-13 LAB — POCT INR: INR: 2

## 2016-03-14 ENCOUNTER — Ambulatory Visit (INDEPENDENT_AMBULATORY_CARE_PROVIDER_SITE_OTHER): Payer: Medicare Other | Admitting: Pulmonary Disease

## 2016-03-14 ENCOUNTER — Encounter: Payer: Self-pay | Admitting: Pulmonary Disease

## 2016-03-14 VITALS — BP 124/70 | HR 105 | Ht 66.0 in | Wt 181.5 lb

## 2016-03-14 DIAGNOSIS — F17210 Nicotine dependence, cigarettes, uncomplicated: Secondary | ICD-10-CM

## 2016-03-14 DIAGNOSIS — I2699 Other pulmonary embolism without acute cor pulmonale: Secondary | ICD-10-CM | POA: Diagnosis not present

## 2016-03-14 DIAGNOSIS — F17200 Nicotine dependence, unspecified, uncomplicated: Secondary | ICD-10-CM | POA: Diagnosis not present

## 2016-03-14 DIAGNOSIS — J449 Chronic obstructive pulmonary disease, unspecified: Secondary | ICD-10-CM

## 2016-03-14 DIAGNOSIS — J439 Emphysema, unspecified: Secondary | ICD-10-CM

## 2016-03-14 DIAGNOSIS — J9611 Chronic respiratory failure with hypoxia: Secondary | ICD-10-CM | POA: Diagnosis not present

## 2016-03-14 LAB — PULMONARY FUNCTION TEST
FEF 25-75 POST: 0.41 L/s
FEF 25-75 PRE: 0.23 L/s
FEF2575-%CHANGE-POST: 81 %
FEF2575-%PRED-POST: 18 %
FEF2575-%PRED-PRE: 10 %
FEV1-%Change-Post: 34 %
FEV1-%PRED-POST: 26 %
FEV1-%Pred-Pre: 19 %
FEV1-Post: 0.69 L
FEV1-Pre: 0.51 L
FEV1FVC-%CHANGE-POST: 6 %
FEV1FVC-%Pred-Pre: 55 %
FEV6-%CHANGE-POST: 22 %
FEV6-%PRED-PRE: 36 %
FEV6-%Pred-Post: 44 %
FEV6-Post: 1.45 L
FEV6-Pre: 1.18 L
FEV6FVC-%Change-Post: -2 %
FEV6FVC-%Pred-Post: 99 %
FEV6FVC-%Pred-Pre: 102 %
FVC-%Change-Post: 26 %
FVC-%PRED-PRE: 35 %
FVC-%Pred-Post: 44 %
FVC-Post: 1.52 L
FVC-Pre: 1.2 L
POST FEV1/FVC RATIO: 45 %
POST FEV6/FVC RATIO: 96 %
PRE FEV1/FVC RATIO: 42 %
Pre FEV6/FVC Ratio: 98 %

## 2016-03-14 NOTE — Progress Notes (Signed)
PFT done today. 

## 2016-03-14 NOTE — Patient Instructions (Signed)
   Continue using your nebulizer medications as prescribed.  Check with [facility] to see if there is a rolling walker with a seat that you could use for your oxygen.  Call me if you have any new breathing problems before your next appointment.

## 2016-03-14 NOTE — Progress Notes (Signed)
Subjective:    Patient ID: [name], female    DOB: [birth year], 66 y.o.   MRN: [id number]  C.C.:  Follow-up for Very Severe COPD w/ Emphysema, H/O Recurrent Pulmonary Embolus, Chronic Hypoxic Respiratory Failure, GERD, & Tobacco Use Disorder.  HPI Very severe COPD with emphysema: Currently on Duonebs 4 times a day & budesonide nebulized twice daily. Continues to have significant dyspnea. She reports intermittent coughing productive of a clear phlegm. No exacerbations since last appointment.   History of recurrent pulmonary embolism: Known factor V Leiden deficiency. Chronic anticoagulation with Coumadin. INR managed by cardiology. Compliant with therapeutic anticoagulation w/ therapeutic INR.   Chronic hypoxic respiratory failure: Prescribed oxygen at 2 L/m while sleeping and with exertion. She is using her oxygen as prescribed.   GERD:  Not currently on medication. She reports no reflux, dyspepsia, or morning brash water taste.   Tobacco use disorder: At last appointment patient had ceased using tobacco. She reports she has started back to smoking 1/2 ppd.   Review of Systems She reports intermittent chest palpitations along with pressure & "shooting" pain that resolves with rest. She does have a significant tremor. No fever or chills. No abdominal pain or nausea. She reports a poor appetite.   Allergies  Allergen Reactions  . Dilaudid [Hydromorphone Hcl] Hives and Nausea Only  . Minocycline Hcl     REACTION: Dizzy  . Prednisone     REACTION: feels like throat swelling  . Varenicline Tartrate     REACTION: Dizzy(chantix)   . Zocor [Simvastatin - High Dose] Other (See Comments)    myalgia   Current Outpatient Prescriptions on File Prior to Visit  Medication Sig Dispense Refill  . albuterol (PROVENTIL) (2.5 MG/3ML) 0.083% nebulizer solution USE 1 VIAL BY NEBULIZER EVERY 4 HOURS AS NEEDED FOR WHEEZING. 375 vial 2  . budesonide (PULMICORT) 0.5 MG/2ML nebulizer solution Take 2  mLs (0.5 mg total) by nebulization 2 (two) times daily. Dx: J43.9 120 mL 3  . fentaNYL (DURAGESIC - DOSED MCG/HR) 50 MCG/HR Place 50 mcg onto the skin every 3 (three) days.    . fish oil-omega-3 fatty acids 1000 MG capsule Take 2 g by mouth daily.      [name] ipratropium-albuterol (DUONEB) 0.5-2.5 (3) MG/3ML SOLN 1 vial via nebulizer at breakfast, lunch, dinner and bedtime daily. May take 2 additional treatments on bad day Dx: J43.9 360 mL 3  . levothyroxine (SYNTHROID, LEVOTHROID) 125 MCG tablet Take 1 tablet (125 mcg total) by mouth daily. 90 tablet 1  . Magnesium 500 MG CAPS Take 500 mg by mouth.    . nitroGLYCERIN (NITROSTAT) 0.4 MG SL tablet Place 1 tablet (0.4 mg total) under the tongue every 5 (five) minutes as needed for chest pain. 25 tablet 3  . Respiratory Therapy Supplies (FLUTTER) DEVI Use as directed 1 each 0  . ticagrelor (BRILINTA) 60 MG TABS tablet Take 1 tablet (60 mg total) by mouth 2 (two) times daily. 60 tablet 11  . warfarin (COUMADIN) 1 MG tablet Take 1 tablet daily (1mg ) along with 1 (5mg ) tablet daily except on Tuesdays and Fridays only take 5mg  tablet 45 tablet 3  . warfarin (COUMADIN) 5 MG tablet take 1 tablet by mouth once daily 30 tablet 6  . atorvastatin (LIPITOR) 40 MG tablet Take 1 tablet (40 mg total) by mouth daily. 30 tablet 6  . ciprofloxacin (CIPRO) 500 MG tablet Take 1 tablet (500 mg total) by mouth 2 (two) times daily. (Patient not taking: Reported on  03/14/2016) 20 tablet 0  . levothyroxine (SYNTHROID, LEVOTHROID) 125 MCG tablet take 1 tablet by mouth once daily (Patient not taking: Reported on 03/14/2016) 30 tablet 1  . levothyroxine (SYNTHROID, LEVOTHROID) 125 MCG tablet take 1 tablet by mouth once daily (Patient not taking: Reported on 03/14/2016) 30 tablet 0   No current facility-administered medications on file prior to visit.    Past Medical History:  Diagnosis Date  . Acute MI    x4, code blue x3  . Arteriosclerotic cardiovascular disease (ASCVD) 2002   Inf  STEMI-2002. 2003-cutting balloon + brachytherapy for restenosis; subsequent acute stent thrombosis 06/2010 requiring 2 separate interventions (Zeta stent, then repeat cath with thrombectomy). focal basal inf AK, nl EF; 03/2011: Patent stents, minor nonobst  residual dz, nl EF; neg stress nuclear in 2008 and stress echo in 2009  . Chronic anticoagulation    Warfarin plus ticagrelor  . Chronic respiratory failure ([place]) 08/27/2013   On 2L 02  . COPD (chronic obstructive pulmonary disease) ([place])    02 dependent  . Factor V Leiden, prothrombin gene mutation ([place]) 2006  . Hyperlipidemia   . Hypothyroidism   . Noncompliance   . Pelvic fracture ([place]) 2009  . Pulmonary embolism ([place]) 2006   Associated with deep vein thrombosis-2006; + factor V Leiden  . Tobacco abuse    50 pack years   Past Surgical History:  Procedure Laterality Date  . COLONOSCOPY  Approximately 2000   Negative screening study  . CORONARY ANGIOPLASTY  2002, 2003, 2012  . LEFT AND RIGHT HEART CATHETERIZATION WITH CORONARY ANGIOGRAM N/A 04/03/2011   Procedure: LEFT AND RIGHT HEART CATHETERIZATION WITH CORONARY ANGIOGRAM;  Surgeon: Sherren Mocha, MD;  Location: [facility] CATH LAB;  Service: Cardiovascular;  Laterality: N/A;   Family History  Problem Relation Age of Onset  . Emphysema Mother   . Alzheimer's disease Mother   . Emphysema Sister   . Heart disease Father   . Factor V Leiden deficiency Father   . Factor V Leiden deficiency Sister   . Factor V Leiden deficiency Daughter   . Kidney cancer Paternal Grandmother    Social History   Social History  . Marital status: Single    Spouse name: N/A  . Number of children: N/A  . Years of education: N/A   Occupational History  . Disabled   . Truck driver    Social History Main Topics  . Smoking status: Former Smoker    Packs/day: 0.50    Years: 50.00    Types: Cigarettes    Start date: 05/10/1968  . Smokeless tobacco: Never Used  . Alcohol use No  . Drug use: No  .  Sexual activity: Not Asked   Other Topics Concern  . None   Social History Narrative   Originally from [place]. Previously has lived in [place], [place], [place] & moved back to [place] in 1994. 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 Ronie Spies was in her current home. She also had an owl living in her house as a rehab pet. She also had chickens until Fall 2015. No mold exposure.       Objective:   Physical Exam BP 124/70 (BP Location: Right Arm, Patient Position: Sitting, Cuff Size: Normal)   Pulse (!) 105   Ht 5\' 6"  (1.676 m)   Wt 181 lb 8 oz (82.3 kg)   SpO2 92%   BMI 29.29 kg/m  Gen.: No distress. Awake. Calm. Integument: Warm and dry. Without rash or bruising on exposed skin. HEENT: No scleral icterus or nasal turbinate swelling. No oral ulcers. Moist mucous membranes. Cardiovascular: Regular rate. Bilateral lower extremity edema. Unable to appreciate JVD. Pulmonary: Slightly diminished breath sounds diffusely. Good aeration bilaterally otherwise. Normal work of breathing on nasal cannula oxygen. Abdomen: Soft. Protuberant. Nontender.  PFT 03/14/16: FVC 1.20 L (35%) FEV1 0.51 L (90%) FEV1/FVC 0.42 FEF 25-75 0.23 L (10%) positive bronchodilator response 11/26/14: FVC 0.91 L (26%) FEV1 0.45 L (17%) FEV1/FVC 0.49 FEF 25-75 0.18 L (7%) positive bronchodilator response                                                                   DLCO uncorrected 22% 07/05/11: FVC 1.38 L (42%) FEV1 0.68 L (28%) FEV1/FVC 0.49 FEF 25-75 0.20 L (7%) negative bronchodilator response TLC 4.37 L (83%) RV 142% ERV 61% DLCO uncorrected 39% 05/31/04: FVC 2.14 L (62%) FEV1 1.23 L (47%) FEV1/FVC 0.57 FEF 25-75 0.56 L (19%) negative bronchodilator response TLC 4.78 L (89%) RV 135% ERV 12% DLCO UNcorrected 65%  6MWT 11/26/14:  Walked 48 meters / Baseline Sat 90% on RA / Nadir Sat 88% on RA (heart rate was irregular & pt ambulated for <62min due to  dyspnea)  IMAGING CTA CHEST 08/25/13 (per radiologist): No pulmonary embolus. Emphysematous & bronchitic changes. Chronic atelectasis versus scarring left lower lobe. Patchy segmental infiltrate in the posterior right lower lobe. No pleural effusion. Small pericardial effusion.  CARDIAC TTE (04/29/13): Mild LVH. EF 50-55 %. grade 1 diastolic dysfunction. Akinesis of basal-mid inferior myocardium. LA & RA normal in size. RV normal in size and function. No aortic stenosis or regurgitation. Trivial mitral regurgitation. Trivial tricuspid regurgitation. IVC normal in size. Small pericardial effusion.   LABS 11/25/14 INR:  3.0  09/21/14 Alpha-1 antitrypsin: MM (174)    Assessment & Plan:  66 y.o. female with a very severe COPD with emphysema, chronic hypoxic respiratory failure, and history of pulmonary embolus with factor V Leiden. She has started back to smoking cigarettes. Patient spirometry today shows significant improvement compared with previous testing. She does still have a significant bronchodilator response likely due to the fact that she used her DuoNeb mini hours prior to her appointment. I believe her COPD is well controlled on her current regimen and as such we'll be continuing. I did spend a significant amount of time today discussing her lung function as well as the potential for further clinical worsening with ongoing tobacco use. She is continuing on systemic anticoagulation for her history of recurrent pulmonary embolism. I instructed the patient contact my office if she had any new breathing problems or questions before next appointment.  1. Very severe COPD with emphysema:  Continuing patient on Pulmicort and Duonebs 4 times a day. No changes. 2. Chronic hypoxic respiratory failure:Continuing oxygen as previously prescribed. Recommended inquiring regarding a rolling walker with a seat and basket to help facilitate carrying her tanks. 3. History of pulmonary embolism: Lifelong  anticoagulation given factor V Leiden.Continuing Coumadin. 4. Tobacco use disorder: Spent 3 minutes counseling the patient will need for complete tobacco cessation. She is going to try to quit cold [place] again. 5. GERD:  Minimal symptoms. No new medications.  6. CODE STATUS: Patient confirms a DO NOT RESUSCITATE/DO NOT INTUBATE CODE STATUS.  7. Health maintenance: Status post Prevnar vaccine March 2016 & Pneumovax March 2013. She had the High Dose Influenza Vaccine in the Fall 2017.  8. Follow-up: Return to clinic in 6 months or sooner if needed.   Sonia Baller Ashok Cordia, M.D. [facility] Pager:  [phone] After 3pm or if no response, call [phone] 2:00 PM 03/14/16

## 2016-04-10 ENCOUNTER — Ambulatory Visit (INDEPENDENT_AMBULATORY_CARE_PROVIDER_SITE_OTHER): Payer: Medicare Other | Admitting: *Deleted

## 2016-04-10 DIAGNOSIS — Z5181 Encounter for therapeutic drug level monitoring: Secondary | ICD-10-CM | POA: Diagnosis not present

## 2016-04-10 LAB — POCT INR: INR: 2.5

## 2016-05-15 ENCOUNTER — Ambulatory Visit (INDEPENDENT_AMBULATORY_CARE_PROVIDER_SITE_OTHER): Payer: Medicare Other | Admitting: *Deleted

## 2016-05-15 DIAGNOSIS — Z5181 Encounter for therapeutic drug level monitoring: Secondary | ICD-10-CM

## 2016-05-15 LAB — POCT INR: INR: 2.2

## 2016-06-08 ENCOUNTER — Encounter: Payer: Self-pay | Admitting: *Deleted

## 2016-06-09 ENCOUNTER — Other Ambulatory Visit: Payer: Self-pay | Admitting: Adult Health

## 2016-06-09 ENCOUNTER — Other Ambulatory Visit: Payer: Self-pay | Admitting: Cardiovascular Disease

## 2016-06-09 NOTE — Telephone Encounter (Signed)
She can have 30 days. Needs a physical for any additional refills

## 2016-06-09 NOTE — Telephone Encounter (Signed)
Please review and advise on refill. Thanks!

## 2016-07-03 ENCOUNTER — Ambulatory Visit (INDEPENDENT_AMBULATORY_CARE_PROVIDER_SITE_OTHER): Payer: Medicare Other | Admitting: *Deleted

## 2016-07-03 DIAGNOSIS — Z5181 Encounter for therapeutic drug level monitoring: Secondary | ICD-10-CM

## 2016-07-03 LAB — POCT INR: INR: 1.8

## 2016-07-24 ENCOUNTER — Ambulatory Visit (INDEPENDENT_AMBULATORY_CARE_PROVIDER_SITE_OTHER): Payer: Medicare Other | Admitting: *Deleted

## 2016-07-24 DIAGNOSIS — Z5181 Encounter for therapeutic drug level monitoring: Secondary | ICD-10-CM | POA: Diagnosis not present

## 2016-07-24 LAB — POCT INR: INR: 2

## 2016-08-06 ENCOUNTER — Other Ambulatory Visit: Payer: Self-pay | Admitting: Cardiovascular Disease

## 2016-08-06 DIAGNOSIS — I4891 Unspecified atrial fibrillation: Secondary | ICD-10-CM

## 2016-08-06 DIAGNOSIS — Z5181 Encounter for therapeutic drug level monitoring: Secondary | ICD-10-CM

## 2016-08-21 ENCOUNTER — Ambulatory Visit (INDEPENDENT_AMBULATORY_CARE_PROVIDER_SITE_OTHER): Payer: Medicare Other | Admitting: *Deleted

## 2016-08-21 DIAGNOSIS — I4891 Unspecified atrial fibrillation: Secondary | ICD-10-CM | POA: Diagnosis not present

## 2016-08-21 DIAGNOSIS — Z5181 Encounter for therapeutic drug level monitoring: Secondary | ICD-10-CM | POA: Diagnosis not present

## 2016-08-21 LAB — POCT INR: INR: 3

## 2016-08-21 MED ORDER — WARFARIN SODIUM 1 MG PO TABS: ORAL_TABLET | ORAL | 3 refills | Status: DC

## 2016-08-21 MED ORDER — WARFARIN SODIUM 5 MG PO TABS: 5.0000 mg | ORAL_TABLET | Freq: Every day | ORAL | 6 refills | Status: DC

## 2016-09-03 ENCOUNTER — Other Ambulatory Visit: Payer: Self-pay | Admitting: Cardiovascular Disease

## 2016-09-06 ENCOUNTER — Ambulatory Visit (INDEPENDENT_AMBULATORY_CARE_PROVIDER_SITE_OTHER): Payer: Medicare Other | Admitting: Adult Health

## 2016-09-06 ENCOUNTER — Encounter: Payer: Self-pay | Admitting: Adult Health

## 2016-09-06 VITALS — BP 130/70 | HR 85 | Temp 98.6°F | Ht 65.0 in | Wt 174.0 lb

## 2016-09-06 DIAGNOSIS — Z7689 Persons encountering health services in other specified circumstances: Secondary | ICD-10-CM

## 2016-09-06 DIAGNOSIS — Z23 Encounter for immunization: Secondary | ICD-10-CM | POA: Diagnosis not present

## 2016-09-06 DIAGNOSIS — N3001 Acute cystitis with hematuria: Secondary | ICD-10-CM

## 2016-09-06 DIAGNOSIS — R8299 Other abnormal findings in urine: Secondary | ICD-10-CM | POA: Diagnosis not present

## 2016-09-06 DIAGNOSIS — R300 Dysuria: Secondary | ICD-10-CM | POA: Diagnosis not present

## 2016-09-06 DIAGNOSIS — E039 Hypothyroidism, unspecified: Secondary | ICD-10-CM

## 2016-09-06 LAB — POC URINALSYSI DIPSTICK (AUTOMATED)
BILIRUBIN UA: NEGATIVE
GLUCOSE UA: NEGATIVE
Ketones, UA: 5
NITRITE UA: POSITIVE
Protein, UA: 2
RBC UA: 1
Spec Grav, UA: 1.025 (ref 1.010–1.025)
Urobilinogen, UA: 0.2 E.U./dL
pH, UA: 6 (ref 5.0–8.0)

## 2016-09-06 MED ORDER — CIPROFLOXACIN HCL 500 MG PO TABS: 500.0000 mg | ORAL_TABLET | Freq: Two times a day (BID) | ORAL | 0 refills | Status: AC

## 2016-09-06 MED ORDER — LEVOTHYROXINE SODIUM 125 MCG PO TABS: 125.0000 ug | ORAL_TABLET | Freq: Every day | ORAL | 3 refills | Status: DC

## 2016-09-06 NOTE — Patient Instructions (Addendum)
It was great meeting you again.   Please follow up with me for your physical in a couple of weeks - please fast after midnight the night prior to the exam   I have sent in Cipro for your UTI

## 2016-09-06 NOTE — Progress Notes (Signed)
Patient presents to clinic today to establish care. She is a pleasant 66 year old female who  has a past medical history of Acute MI ([place]); Arteriosclerotic cardiovascular disease (ASCVD) (2002); Chronic anticoagulation; Chronic respiratory failure (HCC) (08/27/2013); COPD (chronic obstructive pulmonary disease) ([place]); Factor V Leiden, prothrombin gene mutation ([place]) (2006); Hyperlipidemia; Hypothyroidism; Noncompliance; Pelvic fracture ([place]) (2009); Pulmonary embolism ([place]) (2006); and Tobacco abuse.   Acute Concerns: Establish Care  UTI - She reports that her symptoms started about 24 hours ago. She reports that her symptoms include that of dysuria, frequency, and incontinence.   Chronic Issues: Factor V Leiden - Brilinta and Warfarin. Is followed by Cardiology   COPD - oxygen dependent. She uses Pulmicort and Duoneb nebulizers.   Hypothyroidism - Synthroid 125 mcg    Hyperlipidemia - Does not take anything currently. She is a good candidate for injections but cannot afford them   Health Maintenance: Dental -- Does not do to routine care  Vision -- Does not do routine  Immunizations -- Needs Influenza and Pneumovax 23.  Colonoscopy -- 2004 - Normal. She is due. Will due Cologuard  Mammogram -- 2011 - Does not do want to do  PAP -- No longer needed Bone Density -- Does not want to do  She is followed by   1.Pulmonary - Dr. Ashok Cordia  2.Cardiology - Bronson Ing   Past Medical History:  Diagnosis Date  . Acute MI ([place])    x4, code blue x3  . Arteriosclerotic cardiovascular disease (ASCVD) 2002   Inf STEMI-2002. 2003-cutting balloon + brachytherapy for restenosis; subsequent acute stent thrombosis 06/2010 requiring 2 separate interventions (Zeta stent, then repeat cath with thrombectomy). focal basal inf AK, nl EF; 03/2011: Patent stents, minor nonobst  residual dz, nl EF; neg stress nuclear in 2008 and stress echo in 2009  . Chronic anticoagulation    Warfarin plus ticagrelor  .  Chronic respiratory failure ([place]) 08/27/2013   On 2L 02  . COPD (chronic obstructive pulmonary disease) ([place])    02 dependent  . Factor V Leiden, prothrombin gene mutation ([place]) 2006  . Hyperlipidemia   . Hypothyroidism   . Noncompliance   . Pelvic fracture ([place]) 2009  . Pulmonary embolism ([place]) 2006   Associated with deep vein thrombosis-2006; + factor V Leiden  . Tobacco abuse    50 pack years    Past Surgical History:  Procedure Laterality Date  . COLONOSCOPY  Approximately 2000   Negative screening study  . CORONARY ANGIOPLASTY  2002, 2003, 2012  . LEFT AND RIGHT HEART CATHETERIZATION WITH CORONARY ANGIOGRAM N/A 04/03/2011   Procedure: LEFT AND RIGHT HEART CATHETERIZATION WITH CORONARY ANGIOGRAM;  Surgeon: Sherren Mocha, MD;  Location[facility] CATH LAB;  Service: Cardiovascular;  Laterality: N/A;    Current Outpatient Prescriptions on File Prior to Visit  Medication Sig Dispense Refill  . albuterol (PROVENTIL) (2.5 MG/3ML) 0.083% nebulizer solution USE 1 VIAL BY NEBULIZER EVERY 4 HOURS AS NEEDED FOR WHEEZING. 375 vial 2  . budesonide (PULMICORT) 0.5 MG/2ML nebulizer solution Take 2 mLs (0.5 mg total) by nebulization 2 (two) times daily. Dx: J43.9 120 mL 3  . fentaNYL (DURAGESIC - DOSED MCG/HR) 50 MCG/HR Place 50 mcg onto the skin every 3 (three) days.    . fish oil-omega-3 fatty acids 1000 MG capsule Take 2 g by mouth daily.      [name] ipratropium-albuterol (DUONEB) 0.5-2.5 (3) MG/3ML SOLN 1 vial via nebulizer at breakfast, lunch, dinner and bedtime daily. May take 2 additional  treatments on bad day Dx: J43.9 360 mL 3  . levothyroxine (SYNTHROID, LEVOTHROID) 125 MCG tablet take 1 tablet by mouth once daily 30 tablet 0  . Magnesium 500 MG CAPS Take 500 mg by mouth.    . nitroGLYCERIN (NITROSTAT) 0.4 MG SL tablet Place 1 tablet (0.4 mg total) under the tongue every 5 (five) minutes as needed for chest pain. 25 tablet 3  . Respiratory Therapy Supplies (FLUTTER) DEVI Use as directed 1 each 0   . ticagrelor (BRILINTA) 60 MG TABS tablet Take 1 tablet (60 mg total) by mouth 2 (two) times daily. 60 tablet 11  . warfarin (COUMADIN) 1 MG tablet Take 1 tablet (1mg ) on Sundays, Tuesdays, Thursdays and Saturdays along with a 5mg  tablet 45 tablet 3  . warfarin (COUMADIN) 5 MG tablet Take 1 tablet (5 mg total) by mouth daily. 30 tablet 6   No current facility-administered medications on file prior to visit.     Allergies  Allergen Reactions  . Dilaudid [Hydromorphone Hcl] Hives and Nausea Only  . Minocycline Hcl     REACTION: Dizzy  . Prednisone     REACTION: feels like throat swelling  . Varenicline Tartrate     REACTION: Dizzy(chantix)   . Zocor [Simvastatin - High Dose] Other (See Comments)    myalgia    Family History  Problem Relation Age of Onset  . Emphysema Mother   . Alzheimer's disease Mother   . Emphysema Sister   . Heart disease Father   . Factor V Leiden deficiency Father   . Factor V Leiden deficiency Sister   . Factor V Leiden deficiency Daughter   . Kidney cancer Paternal Grandmother     Social History   Social History  . Marital status: Single    Spouse name: N/A  . Number of children: N/A  . Years of education: N/A   Occupational History  . Disabled   . Truck driver    Social History Main Topics  . Smoking status: Current Some Day Smoker    Packs/day: 0.50    Years: 50.00    Types: Cigarettes    Start date: 05/10/1968  . Smokeless tobacco: Never Used  . Alcohol use No  . Drug use: No  . Sexual activity: Not on file   Other Topics Concern  . Not on file   Social History Narrative   Originally from [place]. Previously has lived in [place], [place], [place] & moved back to [place] in 1994. 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 Ronie Spies was in her current home. She also had an owl living in her house as a rehab pet. She also had chickens until Fall 2015. No mold exposure.     Review  of Systems  Constitutional: Negative.   HENT: Negative.   Eyes: Negative.   Respiratory: Positive for shortness of breath and wheezing.   Cardiovascular: Negative.   Gastrointestinal: Negative.   Genitourinary: Positive for dysuria, frequency and urgency. Negative for flank pain and hematuria.  Musculoskeletal: Negative.   Skin: Negative.   Neurological: Negative.   Psychiatric/Behavioral: Negative.   All other systems reviewed and are negative.     BP 130/70 (BP Location: Left Arm)   Pulse 85   Temp 98.6 F (37 C) (Oral)   Ht 5\' 5"  (1.651 m)   Wt 174 lb (78.9 kg)   SpO2 90% Comment: 2 liter O2  BMI  28.96 kg/m   Physical Exam  Constitutional: She is oriented to person, place, and time and well-developed, well-nourished, and in no distress. No distress.  Cardiovascular: Normal rate, regular rhythm, normal heart sounds and intact distal pulses.  Exam reveals no gallop and no friction rub.   No murmur heard. Pulmonary/Chest: Effort normal. She has wheezes (trace throughout ). She has no rales. She exhibits no tenderness.  Abdominal: There is no CVA tenderness.  Musculoskeletal: Normal range of motion. She exhibits no edema, tenderness or deformity.  Neurological: She is alert and oriented to person, place, and time. She has normal reflexes. She displays normal reflexes. No cranial nerve deficit. She exhibits normal muscle tone. Gait normal. Coordination normal. GCS score is 15.  Skin: Skin is warm and dry. No rash noted. She is not diaphoretic. No erythema. No pallor.  Psychiatric: Mood, memory, affect and judgment normal.  Nursing note and vitals reviewed.   Recent Results (from the past 2160 hour(s))  POCT INR     Status: Abnormal   Collection Time: 07/03/16 11:47 AM  Result Value Ref Range   INR 1.8   POCT INR     Status: Normal   Collection Time: 07/24/16 11:11 AM  Result Value Ref Range   INR 2.0   POCT INR     Status: Abnormal   Collection Time: 08/21/16 11:32 AM    Result Value Ref Range   INR 3.0   POCT Urinalysis Dipstick (Automated)     Status: Abnormal   Collection Time: 09/06/16  3:40 PM  Result Value Ref Range   Color, UA YELLOW    Clarity, UA Slightly Cloudy    Glucose, UA neg    Bilirubin, UA neg    Ketones, UA 5    Spec Grav, UA 1.025 1.010 - 1.025   Blood, UA 1    pH, UA 6.0 5.0 - 8.0   Protein, UA 2    Urobilinogen, UA 0.2 0.2 or 1.0 E.U./dL   Nitrite, UA positive    Leukocytes, UA Small (1+) (A) Negative    Assessment/Plan: 1. Encounter to establish care - Follow up for CPE next month  - Follow up sooner if needed  2. Acute cystitis with hematuria - POCT Urinalysis Dipstick (Automated)- + blood, + Protein, + Nitrates, + Leuks.  - Will treat with cipro due to last urine culture sensitivity  - Urine Culture - ciprofloxacin (CIPRO) 500 MG tablet; Take 1 tablet (500 mg total) by mouth 2 (two) times daily.  Dispense: 20 tablet; Refill: 0  3. Need for 23-polyvalent pneumococcal polysaccharide vaccine  - Pneumococcal polysaccharide vaccine 23-valent greater than or equal to 2yo subcutaneous/IM  4. Need for influenza vaccination - Flu vaccine HIGH DOSE PF (Fluzone High dose)  5. Hypothyroidism, unspecified type - levothyroxine (SYNTHROID, LEVOTHROID) 125 MCG tablet; Take 1 tablet (125 mcg total) by mouth daily.  Dispense: 30 tablet; Refill: 3  [name], NP

## 2016-09-09 LAB — URINE CULTURE

## 2016-09-13 ENCOUNTER — Ambulatory Visit (INDEPENDENT_AMBULATORY_CARE_PROVIDER_SITE_OTHER): Payer: Medicare Other | Admitting: *Deleted

## 2016-09-13 DIAGNOSIS — D6851 Activated protein C resistance: Secondary | ICD-10-CM | POA: Diagnosis not present

## 2016-09-13 DIAGNOSIS — Z5181 Encounter for therapeutic drug level monitoring: Secondary | ICD-10-CM | POA: Diagnosis not present

## 2016-09-13 LAB — POCT INR: INR: 3.5

## 2016-09-18 DIAGNOSIS — Z1212 Encounter for screening for malignant neoplasm of rectum: Secondary | ICD-10-CM | POA: Diagnosis not present

## 2016-09-18 DIAGNOSIS — Z1211 Encounter for screening for malignant neoplasm of colon: Secondary | ICD-10-CM | POA: Diagnosis not present

## 2016-09-19 LAB — COLOGUARD: Cologuard: NEGATIVE

## 2016-09-21 ENCOUNTER — Ambulatory Visit (INDEPENDENT_AMBULATORY_CARE_PROVIDER_SITE_OTHER): Payer: Medicare Other | Admitting: Adult Health

## 2016-09-21 ENCOUNTER — Encounter: Payer: Self-pay | Admitting: Adult Health

## 2016-09-21 VITALS — BP 132/80 | HR 103 | Temp 98.4°F | Ht 65.0 in | Wt 170.8 lb

## 2016-09-21 DIAGNOSIS — E039 Hypothyroidism, unspecified: Secondary | ICD-10-CM | POA: Diagnosis not present

## 2016-09-21 DIAGNOSIS — F17200 Nicotine dependence, unspecified, uncomplicated: Secondary | ICD-10-CM | POA: Diagnosis not present

## 2016-09-21 DIAGNOSIS — J9611 Chronic respiratory failure with hypoxia: Secondary | ICD-10-CM

## 2016-09-21 DIAGNOSIS — R8290 Unspecified abnormal findings in urine: Secondary | ICD-10-CM | POA: Diagnosis not present

## 2016-09-21 DIAGNOSIS — E782 Mixed hyperlipidemia: Secondary | ICD-10-CM | POA: Diagnosis not present

## 2016-09-21 LAB — LIPID PANEL
CHOLESTEROL: 194 mg/dL (ref 0–200)
HDL: 52.2 mg/dL (ref >39.00)
LDL Cholesterol: 124 mg/dL — ABNORMAL HIGH (ref 0–99)
NonHDL: 142.15
Total CHOL/HDL Ratio: 4
Triglycerides: 90 mg/dL (ref 0.0–149.0)
VLDL: 18 mg/dL (ref 0.0–40.0)

## 2016-09-21 LAB — HEPATIC FUNCTION PANEL
ALBUMIN: 3.5 g/dL (ref 3.5–5.2)
ALT: 9 U/L (ref 0–35)
AST: 14 U/L (ref 0–37)
Alkaline Phosphatase: 116 U/L (ref 39–117)
Bilirubin, Direct: 0.1 mg/dL (ref 0.0–0.3)
TOTAL PROTEIN: 6.6 g/dL (ref 6.0–8.3)
Total Bilirubin: 0.3 mg/dL (ref 0.2–1.2)

## 2016-09-21 LAB — CBC WITH DIFFERENTIAL/PLATELET
Basophils Absolute: 0.1 10*3/uL (ref 0.0–0.1)
Basophils Relative: 1.1 % (ref 0.0–3.0)
EOS PCT: 1.3 % (ref 0.0–5.0)
Eosinophils Absolute: 0.1 10*3/uL (ref 0.0–0.7)
HCT: 38.8 % (ref 36.0–46.0)
Hemoglobin: 12.5 g/dL (ref 12.0–15.0)
LYMPHS ABS: 1.6 10*3/uL (ref 0.7–4.0)
Lymphocytes Relative: 20.9 % (ref 12.0–46.0)
MCHC: 32.2 g/dL (ref 30.0–36.0)
MCV: 90.8 fl (ref 78.0–100.0)
MONOS PCT: 13.2 % — AB (ref 3.0–12.0)
Monocytes Absolute: 1 10*3/uL (ref 0.1–1.0)
NEUTROS ABS: 4.8 10*3/uL (ref 1.4–7.7)
NEUTROS PCT: 63.5 % (ref 43.0–77.0)
PLATELETS: 311 10*3/uL (ref 150.0–400.0)
RBC: 4.28 Mil/uL (ref 3.87–5.11)
RDW: 15.3 % (ref 11.5–15.5)
WBC: 7.5 10*3/uL (ref 4.0–10.5)

## 2016-09-21 LAB — POC URINALSYSI DIPSTICK (AUTOMATED)
BILIRUBIN UA: NEGATIVE
Clarity, UA: NEGATIVE
Glucose, UA: NEGATIVE
Ketones, UA: NEGATIVE
LEUKOCYTES UA: NEGATIVE
NITRITE UA: NEGATIVE
PH UA: 6 (ref 5.0–8.0)
Spec Grav, UA: 1.025 (ref 1.010–1.025)
UROBILINOGEN UA: 0.2 U/dL

## 2016-09-21 LAB — BASIC METABOLIC PANEL
BUN: 13 mg/dL (ref 6–23)
CO2: 35 meq/L — AB (ref 19–32)
Calcium: 9 mg/dL (ref 8.4–10.5)
Chloride: 101 mEq/L (ref 96–112)
Creatinine, Ser: 0.82 mg/dL (ref 0.40–1.20)
GFR: 74.04 mL/min (ref >60.00)
GLUCOSE: 96 mg/dL (ref 70–99)
POTASSIUM: 4.5 meq/L (ref 3.5–5.1)
Sodium: 140 mEq/L (ref 135–145)

## 2016-09-21 LAB — TSH: TSH: 0.88 u[IU]/mL (ref 0.35–4.50)

## 2016-09-21 NOTE — Progress Notes (Signed)
Subjective:    Patient ID: [name], female    DOB: February 25, 1950, 66 y.o.   MRN: [id number]  HPI  Patient presents for yearly follow up exam . She is a pleasant 66 year old female who  has a past medical history of Acute MI ([place]); Arteriosclerotic cardiovascular disease (ASCVD) (2002); Chronic anticoagulation; Chronic respiratory failure (HCC) (08/27/2013); COPD (chronic obstructive pulmonary disease) ([place]); Factor V Leiden, prothrombin gene mutation ([place]) (2006); Hyperlipidemia; Hypothyroidism; Noncompliance; Pelvic fracture ([place]) (2009); Pulmonary embolism ([place]) (2006); and Tobacco abuse.    Factor V Leiden - Brilinta and Warfarin. Is followed by Cardiology - stable   COPD - oxygen dependent. She uses Pulmicort and Duoneb nebulizers. - stable   Hypothyroidism - Synthroid 125 mcg - Will check TSH today    All immunizations and health maintenance protocols were reviewed with the patient and needed orders were placed. She is up to date on all vaccinations.   Appropriate screening laboratory values were ordered for the patient including screening of hyperlipidemia, renal function and hepatic function.  Medication reconciliation,  past medical history, social history, problem list and allergies were reviewed in detail with the patient  Goals were established with regard to weight loss, exercise, and  diet in compliance with medications  End of life planning was discussed.   She sent in her Cologuard sample this week. Does not do routine dental or vision care. She does not want to do a mammogram nor does she want to bone density screen   Her only acute complaint is that of " I think I am getting another UTI. My urine is starting to have a strong ammonia smell." she denies any other UTI like symptoms     Review of Systems  Constitutional: Positive for activity change and fatigue.  HENT: Negative.   Respiratory: Positive for shortness of breath and wheezing.   Cardiovascular:  Negative.   Gastrointestinal: Negative.   Endocrine: Positive for cold intolerance.  Genitourinary: Negative.   Musculoskeletal: Positive for arthralgias.  Skin: Negative.   Allergic/Immunologic: Negative.   Neurological: Negative.   Hematological: Negative.   Psychiatric/Behavioral: Negative.   All other systems reviewed and are negative.  Past Medical History:  Diagnosis Date  . Acute MI ([place])    x4, code blue x3  . Arteriosclerotic cardiovascular disease (ASCVD) 2002   Inf STEMI-2002. 2003-cutting balloon + brachytherapy for restenosis; subsequent acute stent thrombosis 06/2010 requiring 2 separate interventions (Zeta stent, then repeat cath with thrombectomy). focal basal inf AK, nl EF; 03/2011: Patent stents, minor nonobst  residual dz, nl EF; neg stress nuclear in 2008 and stress echo in 2009  . Chronic anticoagulation    Warfarin plus ticagrelor  . Chronic respiratory failure ([place]) 08/27/2013   On 2L 02  . COPD (chronic obstructive pulmonary disease) ([place])    02 dependent  . Factor V Leiden, prothrombin gene mutation ([place]) 2006  . Hyperlipidemia   . Hypothyroidism   . Noncompliance   . Pelvic fracture ([place]) 2009  . Pulmonary embolism ([place]) 2006   Associated with deep vein thrombosis-2006; + factor V Leiden  . Tobacco abuse    50 pack years    Social History   Social History  . Marital status: Single    Spouse name: N/A  . Number of children: N/A  . Years of education: N/A   Occupational History  . Disabled   . Truck driver    Social History Main Topics  . Smoking status: Current Some Day Smoker  Packs/day: 0.50    Years: 50.00    Types: Cigarettes    Start date: 05/10/1968  . Smokeless tobacco: Never Used  . Alcohol use No  . Drug use: No  . Sexual activity: Not on file   Other Topics Concern  . Not on file   Social History Narrative   Originally from [place]. Previously has lived in [place], [place], [place] & moved back to [place] in 1994. 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 Ronie Spies was in her current home. She also had an owl living in her house as a rehab pet. She also had chickens until Fall 2015. No mold exposure.     Past Surgical History:  Procedure Laterality Date  . COLONOSCOPY  Approximately 2000   Negative screening study  . CORONARY ANGIOPLASTY  2002, 2003, 2012  . LEFT AND RIGHT HEART CATHETERIZATION WITH CORONARY ANGIOGRAM N/A 04/03/2011   Procedure: LEFT AND RIGHT HEART CATHETERIZATION WITH CORONARY ANGIOGRAM;  Surgeon: Sherren Mocha, MD;  Location: [facility] CATH LAB;  Service: Cardiovascular;  Laterality: N/A;    Family History  Problem Relation Age of Onset  . Emphysema Mother   . Alzheimer's disease Mother   . Emphysema Sister   . Heart disease Father   . Factor V Leiden deficiency Father   . Factor V Leiden deficiency Sister   . Factor V Leiden deficiency Daughter   . Kidney cancer Paternal Grandmother     Allergies  Allergen Reactions  . Dilaudid [Hydromorphone Hcl] Hives and Nausea Only  . Minocycline Hcl     REACTION: Dizzy  . Prednisone     REACTION: feels like throat swelling  . Varenicline Tartrate     REACTION: Dizzy(chantix)   . Zocor [Simvastatin - High Dose] Other (See Comments)    myalgia    Current Outpatient Prescriptions on File Prior to Visit  Medication Sig Dispense Refill  . albuterol (PROVENTIL) (2.5 MG/3ML) 0.083% nebulizer solution USE 1 VIAL BY NEBULIZER EVERY 4 HOURS AS NEEDED FOR WHEEZING. 375 vial 2  . budesonide (PULMICORT) 0.5 MG/2ML nebulizer solution Take 2 mLs (0.5 mg total) by nebulization 2 (two) times daily. Dx: J43.9 120 mL 3  . fentaNYL (DURAGESIC - DOSED MCG/HR) 50 MCG/HR Place 50 mcg onto the skin every 3 (three) days.    . fish oil-omega-3 fatty acids 1000 MG capsule Take 2 g by mouth daily.      [name] ipratropium-albuterol (DUONEB) 0.5-2.5 (3) MG/3ML SOLN 1 vial via nebulizer at breakfast, lunch, dinner and  bedtime daily. May take 2 additional treatments on bad day Dx: J43.9 360 mL 3  . levothyroxine (SYNTHROID, LEVOTHROID) 125 MCG tablet Take 1 tablet (125 mcg total) by mouth daily. 30 tablet 3  . Magnesium 500 MG CAPS Take 500 mg by mouth.    . nitroGLYCERIN (NITROSTAT) 0.4 MG SL tablet Place 1 tablet (0.4 mg total) under the tongue every 5 (five) minutes as needed for chest pain. 25 tablet 3  . Respiratory Therapy Supplies (FLUTTER) DEVI Use as directed 1 each 0  . ticagrelor (BRILINTA) 60 MG TABS tablet Take 1 tablet (60 mg total) by mouth 2 (two) times daily. 60 tablet 11  . warfarin (COUMADIN) 1 MG tablet Take 1 tablet (1mg ) on Sundays, Tuesdays, Thursdays and Saturdays along with a 5mg  tablet (Patient taking differently: Take 1 tablet (1mg ) on Tuesdays and Saturdays along with a 5mg  tablet) 45 tablet  3  . warfarin (COUMADIN) 5 MG tablet Take 1 tablet (5 mg total) by mouth daily. 30 tablet 6   No current facility-administered medications on file prior to visit.     BP 132/80   Pulse (!) 103   Temp 98.4 F (36.9 C) (Oral)   Ht 5\' 5"  (1.651 m)   Wt 170 lb 12.8 oz (77.5 kg)   SpO2 90% Comment: 89-90%, O2 @ 2L, [place]  BMI 28.42 kg/m       Objective:   Physical Exam  Constitutional: She is oriented to person, place, and time. She appears well-developed and well-nourished. No distress.  HENT:  Head: Normocephalic and atraumatic.  Right Ear: External ear normal.  Left Ear: External ear normal.  Nose: Nose normal.  Mouth/Throat: Oropharynx is clear and moist. No oropharyngeal exudate.  Eyes: Pupils are equal, round, and reactive to light. Conjunctivae and EOM are normal. Right eye exhibits no discharge. Left eye exhibits no discharge. No scleral icterus.  Neck: Normal range of motion. Neck supple. No JVD present. No tracheal deviation present. No thyromegaly present.  Cardiovascular: Normal rate, regular rhythm, normal heart sounds and intact distal pulses.  Exam reveals no gallop and no  friction rub.   No murmur heard. Pulmonary/Chest: Effort normal. No stridor. No respiratory distress. She has wheezes (trace throughout ). She has no rales. She exhibits no tenderness.  O2 via [place] @ 2 L   Abdominal: Soft. Bowel sounds are normal. She exhibits no distension and no mass. There is no tenderness. There is no rebound and no guarding.  Genitourinary:  Genitourinary Comments: Refused   Musculoskeletal: She exhibits no edema, tenderness or deformity.  Walks with a cain. Has steady gait   Lymphadenopathy:    She has no cervical adenopathy.  Neurological: She is alert and oriented to person, place, and time.  Skin: Skin is warm and dry. No rash noted. She is not diaphoretic. No erythema. No pallor.  Psychiatric: She has a normal mood and affect. Her behavior is normal. Judgment and thought content normal.  Nursing note and vitals reviewed.     Assessment & Plan:  1. Mixed hyperlipidemia - She does not want to go on a statin even if her cholesterol levels are high.  - Basic metabolic panel - CBC with Differential/Platelet - Hepatic function panel - Lipid panel - TSH - POCT Urinalysis Dipstick (Automated)  2. Hypothyroidism, unspecified type - Consider increasing synthroid  - Basic metabolic panel - CBC with Differential/Platelet - Hepatic function panel - Lipid panel - TSH - POCT Urinalysis Dipstick (Automated)  3. Chronic respiratory failure with hypoxia (HCC) - Continue with plan from pulmonary   4. TOBACCO ABUSE - Encouraged to quit smoking   5. Abnormal urine odor - Will send culture and treat if needed - POCT Urinalysis Dipstick (Automated)   [name], NP

## 2016-09-21 NOTE — Patient Instructions (Signed)
It was great seeing you today   I will follow up with you regarding your blood work   Please stay safe over the weekend

## 2016-09-24 ENCOUNTER — Other Ambulatory Visit: Payer: Self-pay | Admitting: Cardiovascular Disease

## 2016-09-27 ENCOUNTER — Encounter: Payer: Self-pay | Admitting: Family Medicine

## 2016-10-03 ENCOUNTER — Encounter: Payer: Self-pay | Admitting: *Deleted

## 2016-10-04 ENCOUNTER — Ambulatory Visit (INDEPENDENT_AMBULATORY_CARE_PROVIDER_SITE_OTHER): Payer: Medicare Other | Admitting: *Deleted

## 2016-10-04 DIAGNOSIS — D6851 Activated protein C resistance: Secondary | ICD-10-CM

## 2016-10-04 DIAGNOSIS — Z5181 Encounter for therapeutic drug level monitoring: Secondary | ICD-10-CM

## 2016-10-04 LAB — POCT INR: INR: 2

## 2016-10-05 ENCOUNTER — Telehealth: Payer: Self-pay | Admitting: Family Medicine

## 2016-10-05 NOTE — Telephone Encounter (Signed)
Pt notified of lab results from 09/21/16.  No questions at this time.  Nothing further needed.

## 2016-10-24 ENCOUNTER — Other Ambulatory Visit: Payer: Self-pay | Admitting: Cardiovascular Disease

## 2016-11-01 ENCOUNTER — Ambulatory Visit (INDEPENDENT_AMBULATORY_CARE_PROVIDER_SITE_OTHER): Payer: Medicare Other | Admitting: *Deleted

## 2016-11-01 DIAGNOSIS — Z5181 Encounter for therapeutic drug level monitoring: Secondary | ICD-10-CM

## 2016-11-01 DIAGNOSIS — D6851 Activated protein C resistance: Secondary | ICD-10-CM

## 2016-11-01 LAB — POCT INR: INR: 2.6

## 2016-11-23 ENCOUNTER — Other Ambulatory Visit: Payer: Self-pay | Admitting: Adult Health

## 2016-11-23 ENCOUNTER — Other Ambulatory Visit: Payer: Self-pay | Admitting: *Deleted

## 2016-11-23 DIAGNOSIS — Z5181 Encounter for therapeutic drug level monitoring: Secondary | ICD-10-CM

## 2016-11-23 DIAGNOSIS — I4891 Unspecified atrial fibrillation: Secondary | ICD-10-CM

## 2016-11-23 DIAGNOSIS — E039 Hypothyroidism, unspecified: Secondary | ICD-10-CM

## 2016-11-23 MED ORDER — WARFARIN SODIUM 5 MG PO TABS: 5.0000 mg | ORAL_TABLET | Freq: Every day | ORAL | 0 refills | Status: DC

## 2016-11-23 MED ORDER — WARFARIN SODIUM 1 MG PO TABS: ORAL_TABLET | ORAL | 0 refills | Status: DC

## 2016-11-24 NOTE — Telephone Encounter (Signed)
Sent to the pharmacy by e-scribe.  Pt had TSH lab on 09/21/16 and normal.

## 2016-11-27 NOTE — Telephone Encounter (Signed)
Pt est with [name]

## 2016-11-29 ENCOUNTER — Ambulatory Visit (INDEPENDENT_AMBULATORY_CARE_PROVIDER_SITE_OTHER): Payer: Medicare Other | Admitting: *Deleted

## 2016-11-29 DIAGNOSIS — Z5181 Encounter for therapeutic drug level monitoring: Secondary | ICD-10-CM | POA: Diagnosis not present

## 2016-11-29 DIAGNOSIS — I4891 Unspecified atrial fibrillation: Secondary | ICD-10-CM | POA: Diagnosis not present

## 2016-11-29 LAB — POCT INR: INR: 2.6

## 2016-12-11 ENCOUNTER — Telehealth: Payer: Self-pay | Admitting: Pulmonary Disease

## 2016-12-11 MED ORDER — ALBUTEROL SULFATE (2.5 MG/3ML) 0.083% IN NEBU: INHALATION_SOLUTION | RESPIRATORY_TRACT | 0 refills | Status: DC

## 2016-12-11 NOTE — Telephone Encounter (Signed)
Rx sent to the pharmacy.  ATC pt, no answer. Left message for pt to call back.,

## 2016-12-12 NOTE — Telephone Encounter (Signed)
LMOM TCB x2

## 2016-12-13 NOTE — Telephone Encounter (Signed)
Patient returned call and was advised albuterol nebulizer sol was sent to pharmacy.  No call back needed.

## 2016-12-20 ENCOUNTER — Telehealth: Payer: Self-pay | Admitting: Pulmonary Disease

## 2016-12-20 NOTE — Telephone Encounter (Signed)
atc pt, no answer, no vm.  Wcb.  

## 2016-12-21 NOTE — Telephone Encounter (Signed)
I have checked [name]'s look at. This document is not there. Attempted to contact pt. No answer, no option to leave a message. Will try back.

## 2016-12-22 NOTE — Telephone Encounter (Signed)
Attempted to contact pt. No answer, no option to leave a message. Will try back.  

## 2016-12-23 ENCOUNTER — Other Ambulatory Visit: Payer: Self-pay | Admitting: Adult Health

## 2016-12-23 DIAGNOSIS — E039 Hypothyroidism, unspecified: Secondary | ICD-10-CM

## 2016-12-25 ENCOUNTER — Other Ambulatory Visit: Payer: Self-pay | Admitting: *Deleted

## 2016-12-25 ENCOUNTER — Other Ambulatory Visit: Payer: Self-pay

## 2016-12-25 MED ORDER — WARFARIN SODIUM 1 MG PO TABS: ORAL_TABLET | ORAL | 3 refills | Status: DC

## 2016-12-25 NOTE — Telephone Encounter (Signed)
Sent in for 9 month 11/2016.  Message sent to the pharmacy to check file.

## 2016-12-25 NOTE — Telephone Encounter (Signed)
I have checked JN's look at. We still have not received this form. Attempted to contact pt to find out what pharmacy this should be coming from. No answer, no option to leave a message. Will try back.

## 2016-12-26 NOTE — Telephone Encounter (Signed)
We have attempted to contact the pt several times with no success or call back from the pt. Per triage protocol, message will be closed.  

## 2016-12-27 ENCOUNTER — Telehealth: Payer: Self-pay | Admitting: Adult Health

## 2016-12-27 NOTE — Telephone Encounter (Signed)
Pt calling, stating that Halma has been faxing a DWO form that needs to be filled out for her medications.   [facility], [facility] is being faxed again to our office- verified fax #.  Will await fax.

## 2016-12-28 NOTE — Telephone Encounter (Signed)
JJ please advise if you've received this form, or if it needs to be refaxed to the office.  Thanks!

## 2016-12-29 NOTE — Telephone Encounter (Signed)
Form filled out as was done for JN in January 2018 Signed by TP and placed up front for fax Form sent for fax  Nothing further needed; will sign off

## 2016-12-29 NOTE — Telephone Encounter (Signed)
No forms have been received on this patient [facility] and spoke with pharmacist Estill Bamberg to have form refaxed to my attention in triage  Form received Will have TP sign

## 2016-12-29 NOTE — Telephone Encounter (Signed)
Upon inspection of the form, it says: Date of last face-to-face encounter/office visit for diagnosis below (must be within 6 months prior to order date to be valid)  Patient was last seen 03/2016 by JN with recs to follow up in 6 months  Pt has not scheduled appt  Called spoke with patient to discuss - she "does not want to go out in flu season" Pt did see her PCP in September 2018  Advised pt will sign form but if insurance kicks it back, she could try her PCP to see if they could sign for her Appt scheduled with SG for 1.14.19 @ 1130 - appt card mailed to pt's verified home address

## 2017-01-03 ENCOUNTER — Ambulatory Visit (INDEPENDENT_AMBULATORY_CARE_PROVIDER_SITE_OTHER): Payer: Medicare Other | Admitting: *Deleted

## 2017-01-03 DIAGNOSIS — Z5181 Encounter for therapeutic drug level monitoring: Secondary | ICD-10-CM

## 2017-01-03 LAB — POCT INR: INR: 1.8

## 2017-01-22 ENCOUNTER — Other Ambulatory Visit: Payer: Self-pay | Admitting: *Deleted

## 2017-01-22 ENCOUNTER — Ambulatory Visit: Payer: Medicare Other | Admitting: Acute Care

## 2017-01-22 ENCOUNTER — Other Ambulatory Visit: Payer: Self-pay | Admitting: Adult Health

## 2017-01-22 DIAGNOSIS — E039 Hypothyroidism, unspecified: Secondary | ICD-10-CM

## 2017-01-22 DIAGNOSIS — Z5181 Encounter for therapeutic drug level monitoring: Secondary | ICD-10-CM

## 2017-01-22 DIAGNOSIS — I4891 Unspecified atrial fibrillation: Secondary | ICD-10-CM

## 2017-01-22 MED ORDER — WARFARIN SODIUM 1 MG PO TABS: ORAL_TABLET | ORAL | 4 refills | Status: DC

## 2017-01-22 MED ORDER — WARFARIN SODIUM 5 MG PO TABS: 5.0000 mg | ORAL_TABLET | Freq: Every day | ORAL | 4 refills | Status: DC

## 2017-01-24 ENCOUNTER — Encounter: Payer: Self-pay | Admitting: Acute Care

## 2017-01-24 ENCOUNTER — Ambulatory Visit (INDEPENDENT_AMBULATORY_CARE_PROVIDER_SITE_OTHER): Payer: Medicare Other | Admitting: Acute Care

## 2017-01-24 VITALS — BP 144/70 | HR 108 | Ht 66.0 in | Wt 167.8 lb

## 2017-01-24 DIAGNOSIS — R002 Palpitations: Secondary | ICD-10-CM | POA: Diagnosis not present

## 2017-01-24 DIAGNOSIS — J439 Emphysema, unspecified: Secondary | ICD-10-CM

## 2017-01-24 MED ORDER — IPRATROPIUM-ALBUTEROL 0.5-2.5 (3) MG/3ML IN SOLN: RESPIRATORY_TRACT | 3 refills | Status: DC

## 2017-01-24 MED ORDER — ALBUTEROL SULFATE (2.5 MG/3ML) 0.083% IN NEBU: INHALATION_SOLUTION | RESPIRATORY_TRACT | 0 refills | Status: DC

## 2017-01-24 MED ORDER — BUDESONIDE 0.5 MG/2ML IN SUSP: 0.5000 mg | Freq: Two times a day (BID) | RESPIRATORY_TRACT | 3 refills | Status: DC

## 2017-01-24 NOTE — Progress Notes (Addendum)
History of Present Illness [name] is a 67 y.o. female current every day smoker with Very Severe COPD w/ Emphysema, H/O Recurrent Pulmonary Embolus, Chronic Hypoxic Respiratory Failure, GERD, & Tobacco Use Disorder.She was followed by Dr. Ashok Cordia.  HPI Very severe COPD with emphysema: Currently on Duonebs 4 times a day & budesonide nebulized twice daily. Continues to have significant dyspnea. She reports intermittent coughing productive of a clear phlegm. No exacerbations since last appointment.   History of recurrent pulmonary embolism: Known factor V Leiden deficiency. Chronic anticoagulation with Coumadin. INR managed by cardiology. Compliant with therapeutic anticoagulation w/ therapeutic INR.   Chronic hypoxic respiratory failure: Prescribed oxygen at 2 L/m while sleeping and with exertion. She is using her oxygen as prescribed.   GERD:  Not currently on medication. She reports no reflux, dyspepsia, or morning brash water taste.   Tobacco use disorder: At last appointment patient had ceased using tobacco. She reports she has started back to smoking 1/2 ppd.    01/24/2017 10 month follow up: Pt. Presents for 10 month follow up. She states she is compliant with her  Pulmicort and Duonebs 4 times a day, her oxygen, and  Her Coumadin.  She states her breathing is at baseline. She states there are some days that she is worse she just uses her nebs as needed and that helps.She states that she needs her medications re-ordered. She is wearing a mask today because she does not want to get sick in the waiting room.She denies fever, chest pain, orthopnea or hemoptysis.   Test Results: PFT 03/14/16: FVC 1.20 L (35%) FEV1 0.51 L (90%) FEV1/FVC 0.42 FEF 25-75 0.23 L (10%) positive bronchodilator response 11/26/14: FVC 0.91 L (26%) FEV1 0.45 L (17%) FEV1/FVC 0.49 FEF 25-75 0.18 L (7%) positive bronchodilator response                                                                   DLCO  uncorrected 22% 07/05/11: FVC 1.38 L (42%) FEV1 0.68 L (28%) FEV1/FVC 0.49 FEF 25-75 0.20 L (7%) negative bronchodilator response TLC 4.37 L (83%) RV 142% ERV 61% DLCO uncorrected 39% 05/31/04: FVC 2.14 L (62%) FEV1 1.23 L (47%) FEV1/FVC 0.57 FEF 25-75 0.56 L (19%) negative bronchodilator response TLC 4.78 L (89%) RV 135% ERV 12% DLCO UNcorrected 65%  6MWT 11/26/14:  Walked 48 meters / Baseline Sat 90% on RA / Nadir Sat 88% on RA (heart rate was irregular & pt ambulated for <98min due to dyspnea)  IMAGING CTA CHEST 08/25/13 (per radiologist): No pulmonary embolus. Emphysematous & bronchitic changes. Chronic atelectasis versus scarring left lower lobe. Patchy segmental infiltrate in the posterior right lower lobe. No pleural effusion. Small pericardial effusion.  CARDIAC TTE (04/29/13): Mild LVH. EF 50-55 %. grade 1 diastolic dysfunction. Akinesis of basal-mid inferior myocardium. LA & RA normal in size. RV normal in size and function. No aortic stenosis or regurgitation. Trivial mitral regurgitation. Trivial tricuspid regurgitation. IVC normal in size. Small pericardial effusion.   LABS 11/25/14 INR:  3.0  09/21/14 Alpha-1 antitrypsin: MM (174)  CBC Latest Ref Rng & Units 09/21/2016 12/06/2015 03/30/2014  WBC 4.0 - 10.5 K/uL 7.5 6.5 9.4  Hemoglobin 12.0 - 15.0 g/dL 12.5 12.3 15.6(H)  Hematocrit 36.0 - 46.0 %  38.8 40.7 46.6(H)  Platelets 150.0 - 400.0 K/uL 311.0 254 322.0    BMP Latest Ref Rng & Units 09/21/2016 12/06/2015 03/30/2014  Glucose 70 - 99 mg/dL 96 96 90  BUN 6 - 23 mg/dL 13 17 20   Creatinine 0.40 - 1.20 mg/dL 0.82 0.90 0.94  Sodium 135 - 145 mEq/L 140 138 139  Potassium 3.5 - 5.1 mEq/L 4.5 3.6 4.3  Chloride 96 - 112 mEq/L 101 101 104  CO2 19 - 32 mEq/L 35(H) 31 31  Calcium 8.4 - 10.5 mg/dL 9.0 8.5(L) 9.3    ProBNP    Component Value Date/Time   PROBNP <30.0 04/14/2009 0430    PFT    Component Value Date/Time   FEV1PRE 0.51 03/14/2016 1254   FEV1POST 0.69  03/14/2016 1254   FVCPRE 1.20 03/14/2016 1254   FVCPOST 1.52 03/14/2016 1254   DLCOUNC 6.19 11/26/2014 1414   PREFEV1FVCRT 42 03/14/2016 1254   PSTFEV1FVCRT 45 03/14/2016 1254    No results found.   Past medical hx Past Medical History:  Diagnosis Date  . Acute MI ([place])    x4, code blue x3  . Arteriosclerotic cardiovascular disease (ASCVD) 2002   Inf STEMI-2002. 2003-cutting balloon + brachytherapy for restenosis; subsequent acute stent thrombosis 06/2010 requiring 2 separate interventions (Zeta stent, then repeat cath with thrombectomy). focal basal inf AK, nl EF; 03/2011: Patent stents, minor nonobst  residual dz, nl EF; neg stress nuclear in 2008 and stress echo in 2009  . Chronic anticoagulation    Warfarin plus ticagrelor  . Chronic respiratory failure ([place]) 08/27/2013   On 2L 02  . COPD (chronic obstructive pulmonary disease) ([place])    02 dependent  . Factor V Leiden, prothrombin gene mutation ([place]) 2006  . Hyperlipidemia   . Hypothyroidism   . Noncompliance   . Pelvic fracture ([place]) 2009  . Pulmonary embolism ([place]) 2006   Associated with deep vein thrombosis-2006; + factor V Leiden  . Tobacco abuse    50 pack years     Social History   Tobacco Use  . Smoking status: Current Some Day Smoker    Packs/day: 0.50    Years: 50.00    Pack years: 25.00    Types: Cigarettes    Start date: 05/10/1968  . Smokeless tobacco: Never Used  Substance Use Topics  . Alcohol use: No    Alcohol/week: 0.0 oz  . Drug use: No    [name] reports that she has been smoking cigarettes.  She started smoking about 48 years ago. She has a 25.00 pack-year smoking history. she has never used smokeless tobacco. She reports that she does not drink alcohol or use drugs.  Tobacco Cessation: Pt. Smokes , but she states it is not every day. I have spent 5 minutes counseling patient on smoking cessation this visit.  Past surgical hx, Family hx, Social hx all reviewed.  Current Outpatient  Medications on File Prior to Visit  Medication Sig  . fentaNYL (DURAGESIC - DOSED MCG/HR) 50 MCG/HR Place 50 mcg onto the skin every 3 (three) days.  . fish oil-omega-3 fatty acids 1000 MG capsule Take 2 g by mouth daily.    [name] levothyroxine (SYNTHROID, LEVOTHROID) 125 MCG tablet take 1 tablet by mouth once daily  . Magnesium 500 MG CAPS Take 500 mg by mouth.  . nitroGLYCERIN (NITROSTAT) 0.4 MG SL tablet Place 1 tablet (0.4 mg total) under the tongue every 5 (five) minutes as needed for chest pain.  [name] Respiratory Therapy Supplies (FLUTTER)  DEVI Use as directed  . warfarin (COUMADIN) 1 MG tablet Take 1 tablet (1mg ) on Tuesdays and Saturdays along with a 5mg  tablet  . warfarin (COUMADIN) 5 MG tablet Take 1 tablet (5 mg total) by mouth daily.  . ticagrelor (BRILINTA) 60 MG TABS tablet Take 1 tablet (60 mg total) by mouth 2 (two) times daily. (Patient not taking: Reported on 01/24/2017)   No current facility-administered medications on file prior to visit.      Allergies  Allergen Reactions  . Dilaudid [Hydromorphone Hcl] Hives and Nausea Only  . Minocycline Hcl     REACTION: Dizzy  . Prednisone     REACTION: feels like throat swelling  . Varenicline Tartrate     REACTION: Dizzy(chantix)   . Zocor [Simvastatin - High Dose] Other (See Comments)    myalgia    Review Of Systems:  Constitutional:   No  weight loss, night sweats,  Fevers, chills, fatigue, or  lassitude.  HEENT:   No headaches,  Difficulty swallowing,  Tooth/dental problems, or  Sore throat,                No sneezing, itching, ear ache, nasal congestion, post nasal drip,   CV:  No chest pain,  Orthopnea, PND, swelling in lower extremities, anasarca, dizziness, palpitations, syncope.   GI  No heartburn, indigestion, abdominal pain, nausea, vomiting, diarrhea, change in bowel habits, loss of appetite, bloody stools.   Resp: + shortness of breath with exertion less at rest.  No excess mucus, no productive cough,  No  non-productive cough,  No coughing up of blood.  No change in color of mucus.  No wheezing.  No chest wall deformity  Skin: no rash or lesions.  GU: no dysuria, change in color of urine, no urgency or frequency.  No flank pain, no hematuria   MS:  No joint pain or swelling.  No decreased range of motion.  No back pain.  Psych:  No change in mood or affect. No depression or anxiety.  No memory loss.   Vital Signs BP (!) 144/70 (BP Location: Left Arm, Cuff Size: Normal)   Pulse (!) 108   Ht 5\' 6"  (1.676 m)   Wt 167 lb 12.8 oz (76.1 kg)   SpO2 97%   BMI 27.08 kg/m    Physical Exam:  General- No distress,  A&Ox3, smells strongly of cat urine ENT: No sinus tenderness, TM clear, pale nasal mucosa, no oral exudate,no post nasal drip, no LAN Cardiac: S1, S2, regular rate and rhythm, no murmur Chest: No wheeze/ rales/ dullness; no accessory muscle use, no nasal flaring, no sternal retractions, diminished per bases Abd.: Soft Non-tender Ext: No clubbing cyanosis, edema Neuro:  Deconditioned at baseline Skin: No rashes, warm and dry Psych: normal mood and behavior   Assessment/Plan  1. Very severe COPD with emphysema:  Continuing patient on Pulmicort and Duonebs 4 times a day. No changes.We will renew scripts 2. Chronic hypoxic respiratory failure:Continuing oxygen as previously prescribed. Recommended inquiring regarding a rolling walker with a seat and basket to help facilitate carrying her tanks. Saturation Goals 88-92%, Do not smoke while wearing oxygen. 3. History of pulmonary embolism: Lifelong anticoagulation given factor V Leiden.Continuing Coumadin. 4. Tobacco use disorder: Spent 5 minutes counseling the patient will need for complete tobacco cessation. Is compliant with INR monitoring 5. GERD:  Minimal symptoms. No new medications.  6. CODE STATUS: Patient confirms a DO NOT RESUSCITATE/DO NOT INTUBATE CODE STATUS.  7. Health maintenance: Status  post Prevnar vaccine March 2016  & Pneumovax March 2013, with booster Fall of 2018.[name] She had the High Dose Influenza Vaccine in the Fall 2018.  8. Follow-up: Return to clinic in 6 months or sooner if needed.  9. Reassign to Dr. Dorthula Perfect, NP 01/24/2017  4:41 PM

## 2017-01-24 NOTE — Telephone Encounter (Signed)
FILLED FOR 1 YEAR ON 11/24/2016.  REFILL REQUEST IS TOO EARLY.  MEDICATION DENIED.  MESSAGE SENT TO THE PHARMACY.

## 2017-01-24 NOTE — Patient Instructions (Addendum)
  Very severe COPD with emphysema:  Continuing patient on Pulmicort and Duonebs 4 times a day. No changes. Chronic hypoxic respiratory failure:Continuing oxygen at 2 L as previously prescribed. Recommended inquiring regarding a rolling walker with a seat and basket to help facilitate carrying her tanks. Saturation Goals 88-92%. Don't smoke while wearing oxygen History of pulmonary embolism: Lifelong anticoagulation given factor V Leiden.Continuing Coumadin. Tobacco use disorder: Spent 5 minutes counseling the patient will need for complete tobacco cessation.  GERD:  Minimal symptoms. No new medications.  CODE STATUS: Patient confirms a DO NOT RESUSCITATE/DO NOT INTUBATE CODE STATUS.  Health maintenance: Status post Prevnar vaccine March 2016 & Pneumovax March 2013, with booster Fall 2018. She had the High Dose Influenza Vaccine in the Fall 2018.  Follow up with cardiology re: intermittent elevation in HR. Follow-up: Return to clinic in 6 months or sooner if needed.   Pt. Needs letter from pulmonology  stating that she cannot take her  trash receptacles to the street for pick up due to health issues. Please fax to [phone].   Please re-order nebs and meds for patient. Please re-assign patient to Dr. Lake Bells.

## 2017-01-31 ENCOUNTER — Ambulatory Visit (INDEPENDENT_AMBULATORY_CARE_PROVIDER_SITE_OTHER): Payer: Medicare Other | Admitting: *Deleted

## 2017-01-31 DIAGNOSIS — I4891 Unspecified atrial fibrillation: Secondary | ICD-10-CM

## 2017-01-31 DIAGNOSIS — Z5181 Encounter for therapeutic drug level monitoring: Secondary | ICD-10-CM

## 2017-01-31 DIAGNOSIS — D6851 Activated protein C resistance: Secondary | ICD-10-CM

## 2017-01-31 LAB — POCT INR: INR: 1.8

## 2017-01-31 MED ORDER — WARFARIN SODIUM 5 MG PO TABS: 5.0000 mg | ORAL_TABLET | Freq: Every day | ORAL | 3 refills | Status: DC

## 2017-01-31 NOTE — Patient Instructions (Signed)
Take coumadin 6mg  tonight then increase dose to 5mg  daily except 6mg  on Tuesdays, Thursdays and Saturdays Recheck in 4 weeks

## 2017-02-02 ENCOUNTER — Telehealth: Payer: Self-pay | Admitting: Acute Care

## 2017-02-02 NOTE — Telephone Encounter (Signed)
Left message for patient to call back. Letter was produced on the 1/16 but unsure of where it was sent.

## 2017-02-02 NOTE — Telephone Encounter (Signed)
Left voice mail on machine for [facility] with [employer]. X1

## 2017-02-02 NOTE — Telephone Encounter (Signed)
Called and spoke with patient. Pt states seen SG on 01/24/2017 regarding COPD, emphysema, and PE Pt advised not able to take her trash receptacles to the street for pick up due to ongoing health issues SG advised we will send letter from pulmonology that she cannot complete this task Pt advised that she called this morning to [employer] and they have not rec'd letter yet Spoke with SG nurse, not been completed  [employer] in [place] at phone [phone] Spoke with Vella Redhead; she states simple letting stating pt unable to take trash to street for files is needed Completed letter for [employer] at fax [phone] Nothing further needed

## 2017-02-02 NOTE — Telephone Encounter (Signed)
Patient called back - pt would like letter re-faxed to [employer]- fax # [phone]. Patient can be reached at [phone] -pr

## 2017-02-05 NOTE — Telephone Encounter (Signed)
lmtcb x2 for [facility] with [employer].

## 2017-02-07 NOTE — Telephone Encounter (Signed)
Called and spoke with Anderson Malta at [employer]. This letter for the pt needs to be faxed to [phone]. Letter has been faxed. Nothing further was needed.

## 2017-02-19 ENCOUNTER — Inpatient Hospital Stay (HOSPITAL_COMMUNITY): Payer: Medicare Other

## 2017-02-19 ENCOUNTER — Inpatient Hospital Stay (HOSPITAL_COMMUNITY)
Admission: EM | Admit: 2017-02-19 | Discharge: 2017-03-05 | DRG: 207 | Disposition: A | Discharge status: Home / Self Care | Payer: Medicare Other | Attending: Family Medicine | Admitting: Family Medicine

## 2017-02-19 ENCOUNTER — Telehealth: Payer: Self-pay | Admitting: Family Medicine

## 2017-02-19 ENCOUNTER — Encounter (HOSPITAL_COMMUNITY): Payer: Self-pay | Admitting: Emergency Medicine

## 2017-02-19 ENCOUNTER — Emergency Department (HOSPITAL_COMMUNITY): Payer: Medicare Other

## 2017-02-19 ENCOUNTER — Other Ambulatory Visit: Payer: Self-pay

## 2017-02-19 DIAGNOSIS — Z820 Family history of epilepsy and other diseases of the nervous system: Secondary | ICD-10-CM

## 2017-02-19 DIAGNOSIS — J1000 Influenza due to other identified influenza virus with unspecified type of pneumonia: Secondary | ICD-10-CM | POA: Diagnosis not present

## 2017-02-19 DIAGNOSIS — R569 Unspecified convulsions: Secondary | ICD-10-CM | POA: Diagnosis not present

## 2017-02-19 DIAGNOSIS — Z79890 Hormone replacement therapy (postmenopausal): Secondary | ICD-10-CM

## 2017-02-19 DIAGNOSIS — J09X2 Influenza due to identified novel influenza A virus with other respiratory manifestations: Secondary | ICD-10-CM | POA: Diagnosis not present

## 2017-02-19 DIAGNOSIS — Z7901 Long term (current) use of anticoagulants: Secondary | ICD-10-CM | POA: Diagnosis not present

## 2017-02-19 DIAGNOSIS — E872 Acidosis: Secondary | ICD-10-CM | POA: Diagnosis not present

## 2017-02-19 DIAGNOSIS — Z9981 Dependence on supplemental oxygen: Secondary | ICD-10-CM

## 2017-02-19 DIAGNOSIS — J439 Emphysema, unspecified: Secondary | ICD-10-CM | POA: Diagnosis present

## 2017-02-19 DIAGNOSIS — R0989 Other specified symptoms and signs involving the circulatory and respiratory systems: Secondary | ICD-10-CM | POA: Diagnosis not present

## 2017-02-19 DIAGNOSIS — J441 Chronic obstructive pulmonary disease with (acute) exacerbation: Secondary | ICD-10-CM | POA: Diagnosis present

## 2017-02-19 DIAGNOSIS — I252 Old myocardial infarction: Secondary | ICD-10-CM | POA: Diagnosis not present

## 2017-02-19 DIAGNOSIS — J9600 Acute respiratory failure, unspecified whether with hypoxia or hypercapnia: Secondary | ICD-10-CM | POA: Diagnosis not present

## 2017-02-19 DIAGNOSIS — J9621 Acute and chronic respiratory failure with hypoxia: Secondary | ICD-10-CM | POA: Diagnosis not present

## 2017-02-19 DIAGNOSIS — I340 Nonrheumatic mitral (valve) insufficiency: Secondary | ICD-10-CM | POA: Diagnosis not present

## 2017-02-19 DIAGNOSIS — J9622 Acute and chronic respiratory failure with hypercapnia: Secondary | ICD-10-CM | POA: Diagnosis present

## 2017-02-19 DIAGNOSIS — Z825 Family history of asthma and other chronic lower respiratory diseases: Secondary | ICD-10-CM

## 2017-02-19 DIAGNOSIS — J90 Pleural effusion, not elsewhere classified: Secondary | ICD-10-CM | POA: Diagnosis not present

## 2017-02-19 DIAGNOSIS — Z881 Allergy status to other antibiotic agents status: Secondary | ICD-10-CM

## 2017-02-19 DIAGNOSIS — R05 Cough: Secondary | ICD-10-CM | POA: Diagnosis not present

## 2017-02-19 DIAGNOSIS — R000 Tachycardia, unspecified: Secondary | ICD-10-CM | POA: Diagnosis not present

## 2017-02-19 DIAGNOSIS — R509 Fever, unspecified: Secondary | ICD-10-CM | POA: Diagnosis not present

## 2017-02-19 DIAGNOSIS — E870 Hyperosmolality and hypernatremia: Secondary | ICD-10-CM | POA: Diagnosis not present

## 2017-02-19 DIAGNOSIS — J9690 Respiratory failure, unspecified, unspecified whether with hypoxia or hypercapnia: Secondary | ICD-10-CM | POA: Diagnosis not present

## 2017-02-19 DIAGNOSIS — Z86711 Personal history of pulmonary embolism: Secondary | ICD-10-CM | POA: Diagnosis not present

## 2017-02-19 DIAGNOSIS — R0600 Dyspnea, unspecified: Secondary | ICD-10-CM | POA: Diagnosis not present

## 2017-02-19 DIAGNOSIS — I5033 Acute on chronic diastolic (congestive) heart failure: Secondary | ICD-10-CM | POA: Diagnosis present

## 2017-02-19 DIAGNOSIS — J9601 Acute respiratory failure with hypoxia: Secondary | ICD-10-CM

## 2017-02-19 DIAGNOSIS — D6851 Activated protein C resistance: Secondary | ICD-10-CM | POA: Diagnosis not present

## 2017-02-19 DIAGNOSIS — Z7951 Long term (current) use of inhaled steroids: Secondary | ICD-10-CM

## 2017-02-19 DIAGNOSIS — Z888 Allergy status to other drugs, medicaments and biological substances status: Secondary | ICD-10-CM

## 2017-02-19 DIAGNOSIS — I110 Hypertensive heart disease with heart failure: Secondary | ICD-10-CM | POA: Diagnosis present

## 2017-02-19 DIAGNOSIS — Z8249 Family history of ischemic heart disease and other diseases of the circulatory system: Secondary | ICD-10-CM | POA: Diagnosis not present

## 2017-02-19 DIAGNOSIS — G253 Myoclonus: Secondary | ICD-10-CM | POA: Diagnosis present

## 2017-02-19 DIAGNOSIS — G9341 Metabolic encephalopathy: Secondary | ICD-10-CM | POA: Diagnosis present

## 2017-02-19 DIAGNOSIS — Z8051 Family history of malignant neoplasm of kidney: Secondary | ICD-10-CM | POA: Diagnosis not present

## 2017-02-19 DIAGNOSIS — Z885 Allergy status to narcotic agent status: Secondary | ICD-10-CM

## 2017-02-19 DIAGNOSIS — J101 Influenza due to other identified influenza virus with other respiratory manifestations: Secondary | ICD-10-CM

## 2017-02-19 DIAGNOSIS — D6852 Prothrombin gene mutation: Secondary | ICD-10-CM | POA: Diagnosis present

## 2017-02-19 DIAGNOSIS — R0602 Shortness of breath: Secondary | ICD-10-CM | POA: Diagnosis not present

## 2017-02-19 DIAGNOSIS — E785 Hyperlipidemia, unspecified: Secondary | ICD-10-CM | POA: Diagnosis present

## 2017-02-19 DIAGNOSIS — E039 Hypothyroidism, unspecified: Secondary | ICD-10-CM | POA: Diagnosis present

## 2017-02-19 DIAGNOSIS — R4182 Altered mental status, unspecified: Secondary | ICD-10-CM | POA: Diagnosis not present

## 2017-02-19 DIAGNOSIS — J189 Pneumonia, unspecified organism: Secondary | ICD-10-CM

## 2017-02-19 DIAGNOSIS — Z9861 Coronary angioplasty status: Secondary | ICD-10-CM

## 2017-02-19 DIAGNOSIS — R0682 Tachypnea, not elsewhere classified: Secondary | ICD-10-CM | POA: Diagnosis not present

## 2017-02-19 DIAGNOSIS — F419 Anxiety disorder, unspecified: Secondary | ICD-10-CM | POA: Diagnosis present

## 2017-02-19 DIAGNOSIS — F17210 Nicotine dependence, cigarettes, uncomplicated: Secondary | ICD-10-CM | POA: Diagnosis present

## 2017-02-19 DIAGNOSIS — Z66 Do not resuscitate: Secondary | ICD-10-CM | POA: Diagnosis present

## 2017-02-19 DIAGNOSIS — Z01818 Encounter for other preprocedural examination: Secondary | ICD-10-CM

## 2017-02-19 DIAGNOSIS — J9811 Atelectasis: Secondary | ICD-10-CM | POA: Diagnosis not present

## 2017-02-19 DIAGNOSIS — I2510 Atherosclerotic heart disease of native coronary artery without angina pectoris: Secondary | ICD-10-CM | POA: Diagnosis present

## 2017-02-19 LAB — CBC WITH DIFFERENTIAL/PLATELET
BASOS PCT: 0 %
Basophils Absolute: 0 10*3/uL (ref 0.0–0.1)
EOS ABS: 0.1 10*3/uL (ref 0.0–0.7)
EOS PCT: 1 %
HCT: 40.2 % (ref 36.0–46.0)
Hemoglobin: 12.1 g/dL (ref 12.0–15.0)
LYMPHS ABS: 1.6 10*3/uL (ref 0.7–4.0)
Lymphocytes Relative: 18 %
MCH: 28.3 pg (ref 26.0–34.0)
MCHC: 30.1 g/dL (ref 30.0–36.0)
MCV: 94.1 fL (ref 78.0–100.0)
MONOS PCT: 25 %
Monocytes Absolute: 2.2 10*3/uL — ABNORMAL HIGH (ref 0.1–1.0)
Neutro Abs: 4.9 10*3/uL (ref 1.7–7.7)
Neutrophils Relative %: 56 %
PLATELETS: 219 10*3/uL (ref 150–400)
RBC: 4.27 MIL/uL (ref 3.87–5.11)
RDW: 16.4 % — AB (ref 11.5–15.5)
WBC: 8.8 10*3/uL (ref 4.0–10.5)

## 2017-02-19 LAB — BLOOD GAS, ARTERIAL
ACID-BASE EXCESS: 5.1 mmol/L — AB (ref 0.0–2.0)
Acid-Base Excess: 0.3 mmol/L (ref 0.0–2.0)
Acid-base deficit: 0.5 mmol/L (ref 0.0–2.0)
BICARBONATE: 27.2 mmol/L (ref 20.0–28.0)
Bicarbonate: 22.4 mmol/L (ref 20.0–28.0)
Bicarbonate: 20.4 mmol/L (ref 20.0–28.0)
DELIVERY SYSTEMS: POSITIVE
DRAWN BY: 23534
Delivery systems: POSITIVE
Drawn by: 28459
Drawn by: 28459
EXPIRATORY PAP: 5
Expiratory PAP: 6
FIO2: 40
FIO2: 40
INSPIRATORY PAP: 14
Inspiratory PAP: 18
LHR: 10 {breaths}/min
LHR: 30 {breaths}/min
O2 CONTENT: 4 L/min
O2 SAT: 93.7 %
O2 Saturation: 93 %
O2 Saturation: 81.9 %
PATIENT TEMPERATURE: 37
PO2 ART: 72.4 mmHg — AB (ref 83.0–108.0)
Patient temperature: 37
Patient temperature: 37
pCO2 arterial: 65.4 mmHg (ref 32.0–48.0)
pCO2 arterial: 84.8 mmHg (ref 32.0–48.0)
pCO2 arterial: 106 mmHg (ref 32.0–48.0)
pH, Arterial: 7.299 — ABNORMAL LOW (ref 7.350–7.450)
pH, Arterial: 7.152 — CL (ref 7.350–7.450)
pH, Arterial: 7.065 — CL (ref 7.350–7.450)
pO2, Arterial: 81.2 mmHg — ABNORMAL LOW (ref 83.0–108.0)
pO2, Arterial: 60 mmHg — ABNORMAL LOW (ref 83.0–108.0)

## 2017-02-19 LAB — INFLUENZA PANEL BY PCR (TYPE A & B)
INFLBPCR: NEGATIVE
Influenza A By PCR: POSITIVE — AB

## 2017-02-19 LAB — COMPREHENSIVE METABOLIC PANEL
ALK PHOS: 113 U/L (ref 38–126)
ALT: 14 U/L (ref 14–54)
AST: 24 U/L (ref 15–41)
Albumin: 3.3 g/dL — ABNORMAL LOW (ref 3.5–5.0)
Anion gap: 12 (ref 5–15)
BUN: 15 mg/dL (ref 6–20)
CALCIUM: 8.7 mg/dL — AB (ref 8.9–10.3)
CO2: 30 mmol/L (ref 22–32)
CREATININE: 0.94 mg/dL (ref 0.44–1.00)
Chloride: 98 mmol/L — ABNORMAL LOW (ref 101–111)
GFR calc Af Amer: >60 mL/min (ref >60)
Glucose, Bld: 114 mg/dL — ABNORMAL HIGH (ref 65–99)
Potassium: 3.8 mmol/L (ref 3.5–5.1)
Sodium: 140 mmol/L (ref 135–145)
Total Bilirubin: 0.4 mg/dL (ref 0.3–1.2)
Total Protein: 7.4 g/dL (ref 6.5–8.1)

## 2017-02-19 LAB — BRAIN NATRIURETIC PEPTIDE: B NATRIURETIC PEPTIDE 5: 121 pg/mL — AB (ref 0.0–100.0)

## 2017-02-19 LAB — TROPONIN I

## 2017-02-19 LAB — PROTIME-INR
INR: 1.58
Prothrombin Time: 18.7 seconds — ABNORMAL HIGH (ref 11.4–15.2)

## 2017-02-19 LAB — LACTIC ACID, PLASMA: Lactic Acid, Venous: 1.1 mmol/L (ref 0.5–1.9)

## 2017-02-19 MED ORDER — IPRATROPIUM-ALBUTEROL 0.5-2.5 (3) MG/3ML IN SOLN
3.0000 mL | Freq: Four times a day (QID) | RESPIRATORY_TRACT | Status: DC
  Administered 2017-02-20 – 2017-03-02 (×44): 3 mL via RESPIRATORY_TRACT
  Filled 2017-02-19 (×44): qty 3

## 2017-02-19 MED ORDER — PIPERACILLIN-TAZOBACTAM 3.375 G IVPB 30 MIN
3.3750 g | Freq: Once | INTRAVENOUS | Status: AC
  Administered 2017-02-19: 3.375 g via INTRAVENOUS
  Filled 2017-02-19: qty 50

## 2017-02-19 MED ORDER — SODIUM CHLORIDE 0.9 % IV SOLN: 250.0000 mL | INTRAVENOUS | Status: DC | PRN

## 2017-02-19 MED ORDER — METHYLPREDNISOLONE SODIUM SUCC 125 MG IJ SOLR
80.0000 mg | Freq: Three times a day (TID) | INTRAMUSCULAR | Status: DC
  Administered 2017-02-20 – 2017-02-25 (×16): 80 mg via INTRAVENOUS
  Filled 2017-02-19 (×16): qty 2

## 2017-02-19 MED ORDER — VANCOMYCIN HCL IN DEXTROSE 1-5 GM/200ML-% IV SOLN
1000.0000 mg | Freq: Once | INTRAVENOUS | Status: DC
  Filled 2017-02-19: qty 200

## 2017-02-19 MED ORDER — PIPERACILLIN-TAZOBACTAM 3.375 G IVPB
3.3750 g | Freq: Three times a day (TID) | INTRAVENOUS | Status: DC
  Administered 2017-02-20 – 2017-02-22 (×8): 3.375 g via INTRAVENOUS
  Filled 2017-02-19 (×7): qty 50

## 2017-02-19 MED ORDER — SODIUM CHLORIDE 0.9 % IV BOLUS (SEPSIS)
500.0000 mL | Freq: Once | INTRAVENOUS | Status: AC
  Administered 2017-02-19: 500 mL via INTRAVENOUS

## 2017-02-19 MED ORDER — VANCOMYCIN HCL 10 G IV SOLR
1500.0000 mg | Freq: Once | INTRAVENOUS | Status: AC
  Administered 2017-02-19: 1500 mg via INTRAVENOUS
  Filled 2017-02-19: qty 1500

## 2017-02-19 MED ORDER — PROPOFOL 1000 MG/100ML IV EMUL
INTRAVENOUS | Status: AC
  Administered 2017-02-19: 5 ug/kg/min
  Filled 2017-02-19: qty 100

## 2017-02-19 MED ORDER — SUCCINYLCHOLINE CHLORIDE 20 MG/ML IJ SOLN
100.0000 mg | Freq: Once | INTRAMUSCULAR | Status: AC
Start: 2017-02-19 — End: 2017-02-19
  Administered 2017-02-19: 100 mg via INTRAVENOUS

## 2017-02-19 MED ORDER — LORAZEPAM 2 MG/ML IJ SOLN
0.5000 mg | Freq: Once | INTRAMUSCULAR | Status: AC
  Administered 2017-02-19: 0.5 mg via INTRAVENOUS

## 2017-02-19 MED ORDER — NALOXONE HCL 0.4 MG/ML IJ SOLN
0.4000 mg | Freq: Once | INTRAMUSCULAR | Status: AC
  Administered 2017-02-19: 0.4 mg via INTRAVENOUS
  Filled 2017-02-19: qty 1

## 2017-02-19 MED ORDER — LORAZEPAM 2 MG/ML IJ SOLN
0.5000 mg | Freq: Once | INTRAMUSCULAR | Status: AC
  Administered 2017-02-19: 0.5 mg via INTRAVENOUS
  Filled 2017-02-19: qty 1

## 2017-02-19 MED ORDER — MIDAZOLAM HCL 2 MG/2ML IJ SOLN
4.0000 mg | Freq: Once | INTRAMUSCULAR | Status: AC
  Administered 2017-02-19: 4 mg via INTRAVENOUS

## 2017-02-19 MED ORDER — WARFARIN SODIUM 7.5 MG PO TABS
7.5000 mg | ORAL_TABLET | Freq: Once | ORAL | Status: DC
  Filled 2017-02-19: qty 1

## 2017-02-19 MED ORDER — FAMOTIDINE IN NACL 20-0.9 MG/50ML-% IV SOLN
20.0000 mg | Freq: Two times a day (BID) | INTRAVENOUS | Status: DC
  Administered 2017-02-20 – 2017-02-26 (×15): 20 mg via INTRAVENOUS
  Filled 2017-02-19 (×15): qty 50

## 2017-02-19 MED ORDER — VANCOMYCIN HCL IN DEXTROSE 750-5 MG/150ML-% IV SOLN
750.0000 mg | Freq: Two times a day (BID) | INTRAVENOUS | Status: DC
  Administered 2017-02-20: 750 mg via INTRAVENOUS
  Filled 2017-02-19: qty 150

## 2017-02-19 MED ORDER — OSELTAMIVIR PHOSPHATE 75 MG PO CAPS
75.0000 mg | ORAL_CAPSULE | Freq: Two times a day (BID) | ORAL | Status: DC
  Administered 2017-02-20 – 2017-02-23 (×7): 75 mg via ORAL
  Filled 2017-02-19 (×7): qty 1

## 2017-02-19 MED ORDER — SODIUM CHLORIDE 0.9 % IV BOLUS (SEPSIS)
2000.0000 mL | Freq: Once | INTRAVENOUS | Status: AC
  Administered 2017-02-19: 2000 mL via INTRAVENOUS

## 2017-02-19 MED ORDER — MIDAZOLAM HCL 5 MG/5ML IJ SOLN
INTRAMUSCULAR | Status: AC
  Filled 2017-02-19: qty 5

## 2017-02-19 MED ORDER — ETOMIDATE 2 MG/ML IV SOLN
20.0000 mg | Freq: Once | INTRAVENOUS | Status: AC
  Administered 2017-02-19: 20 mg via INTRAVENOUS

## 2017-02-19 MED ORDER — WARFARIN - PHARMACIST DOSING INPATIENT: Status: DC

## 2017-02-19 NOTE — Progress Notes (Addendum)
Pharmacy Note:  Initial antibiotic(s) regimen of Vancomycin and zosyn ordered by EDP to treat sepsis.  CrCl cannot be calculated (Patient's most recent lab result is older than the maximum 21 days allowed.).   Allergies  Allergen Reactions  . Dilaudid [Hydromorphone Hcl] Hives and Nausea Only  . Minocycline Hcl     REACTION: Dizzy  . Prednisone     REACTION: feels like throat swelling  . Varenicline Tartrate     REACTION: Dizzy(chantix)   . Zocor [Simvastatin - High Dose] Other (See Comments)    myalgia    Vitals:   02/19/17 1536  BP: (!) 149/114  Pulse: (!) 140  Resp: (!) 32  Temp: (!) 103 F (39.4 C)  SpO2: 91%    Anti-infectives (From admission, onward)   Start     Dose/Rate Route Frequency Ordered Stop   02/19/17 1600  vancomycin (VANCOCIN) 1,500 mg in sodium chloride 0.9 % 500 mL IVPB     1,500 mg 250 mL/hr over 120 Minutes Intravenous  Once 02/19/17 1556     02/19/17 1545  piperacillin-tazobactam (ZOSYN) IVPB 3.375 g     3.375 g 100 mL/hr over 30 Minutes Intravenous  Once 02/19/17 1540     02/19/17 1545  vancomycin (VANCOCIN) IVPB 1000 mg/200 mL premix  Status:  Discontinued     1,000 mg 200 mL/hr over 60 Minutes Intravenous  Once 02/19/17 1540 02/19/17 1556     Antimicrobials this admission:  Zosyn 2/11 >>  Vanc 2/11 >>  Azith 2/11 >>  Dose adjustments this admission:  n/a   Microbiology results:  2/11 BCx: pending  UCx:    Sputum:    MRSA PCR:    Plan: Vanc 750mg  IV every 12 hours. Zosyn 3.375gm IV every 8 hours. Follow-up micro data, labs, vitals.   Pricilla Larsson, [facility] 02/19/2017 3:57 PM

## 2017-02-19 NOTE — ED Notes (Signed)
Pt getting anxious, pulled iv out

## 2017-02-19 NOTE — ED Notes (Signed)
CRITICAL VALUE ALERT  Critical Value: PH 7.29 pc02- 65.4, po2, 72.4, bicarb - 27.2, So2 -93.7  Date & Time Notied: 02/19/17  1610  Provider Notified:  Lita Mains

## 2017-02-19 NOTE — ED Provider Notes (Addendum)
[facility] EMERGENCY DEPARTMENT Provider Note   CSN: [id number] Arrival date & time: 02/19/17  1526     History   Chief Complaint Chief Complaint  Patient presents with  . Respiratory Distress    HPI [name] is a 67 y.o. female.  HPI Patient states she is been getting sick over the last 2-3 days.  She has had increased shortness of breath and cough.  No sputum production.  History is limited due to respiratory distress.  Level 5 caveat.  Per EMS patient was given duo nebs x2 en route and 125 mg of Solu-Medrol IV. Past Medical History:  Diagnosis Date  . Acute MI ([place])    x4, code blue x3  . Arteriosclerotic cardiovascular disease (ASCVD) 2002   Inf STEMI-2002. 2003-cutting balloon + brachytherapy for restenosis; subsequent acute stent thrombosis 06/2010 requiring 2 separate interventions (Zeta stent, then repeat cath with thrombectomy). focal basal inf AK, nl EF; 03/2011: Patent stents, minor nonobst  residual dz, nl EF; neg stress nuclear in 2008 and stress echo in 2009  . Chronic anticoagulation    Warfarin plus ticagrelor  . Chronic respiratory failure ([place]) 08/27/2013   On 2L 02  . COPD (chronic obstructive pulmonary disease) ([place])    02 dependent  . Factor V Leiden, prothrombin gene mutation ([place]) 2006  . Hyperlipidemia   . Hypothyroidism   . Noncompliance   . Pelvic fracture ([place]) 2009  . Pulmonary embolism ([place]) 2006   Associated with deep vein thrombosis-2006; + factor V Leiden  . Tobacco abuse    50 pack years    Patient Active Problem List   Diagnosis Date Noted  . Dyspnea 11/26/2014  . Loss of weight 11/26/2014  . Chronic respiratory failure ([place]) 08/27/2013  . Chest pain 08/24/2013  . Encounter for therapeutic drug monitoring 02/13/2013  . Abnormal weight loss 03/21/2012  . Cerebrovascular disease 11/15/2011  . Arteriosclerotic cardiovascular disease (ASCVD)   . Factor V Leiden, prothrombin gene mutation ([place])   . Hyperlipidemia   . Chronic  anticoagulation   . Hypothyroidism 11/23/2006  . TOBACCO ABUSE 11/23/2006  . 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 COPD (chronic obstructive pulmonary disease) with emphysema ([place]) 11/23/2006    Past Surgical History:  Procedure Laterality Date  . COLONOSCOPY  Approximately 2000   Negative screening study  . CORONARY ANGIOPLASTY  2002, 2003, 2012  . LEFT AND RIGHT HEART CATHETERIZATION WITH CORONARY ANGIOGRAM N/A 04/03/2011   Procedure: LEFT AND RIGHT HEART CATHETERIZATION WITH CORONARY ANGIOGRAM;  Surgeon: Sherren Mocha, MD;  Location: [facility] CATH LAB;  Service: Cardiovascular;  Laterality: N/A;    OB History    No data available       Home Medications    Prior to Admission medications   Medication Sig Start Date End Date Taking? Authorizing Provider  albuterol (PROVENTIL) (2.5 MG/3ML) 0.083% nebulizer solution USE 1 VIAL BY NEBULIZER EVERY 4 HOURS AS NEEDED FOR WHEEZING. DX: J44.9 01/24/17   Magdalen Spatz, NP  budesonide (PULMICORT) 0.5 MG/2ML nebulizer solution Take 2 mLs (0.5 mg total) by nebulization 2 (two) times daily. Dx: J43.9 01/24/17   Magdalen Spatz, NP  fentaNYL (DURAGESIC - DOSED MCG/HR) 50 MCG/HR Place 50 mcg onto the skin every 3 (three) days.    [provider]  fish oil-omega-3 fatty acids 1000 MG capsule Take 2 g by mouth daily.      [provider]  ipratropium-albuterol (DUONEB) 0.5-2.5 (3) MG/3ML SOLN 1 vial via nebulizer at breakfast, lunch, dinner and  bedtime daily. May take 2 additional treatments on bad day Dx: J43.9 01/24/17   Magdalen Spatz, NP  levothyroxine (SYNTHROID, LEVOTHROID) 125 MCG tablet take 1 tablet by mouth once daily 11/24/16   Nafziger, Tommi Rumps, NP  Magnesium 500 MG CAPS Take 500 mg by mouth.    [provider]  nitroGLYCERIN (NITROSTAT) 0.4 MG SL tablet Place 1 tablet (0.4 mg total) under the tongue every 5 (five) minutes as needed for chest pain. 10/28/14   Herminio Commons, MD  Respiratory Therapy Supplies (FLUTTER)  DEVI Use as directed 05/14/15   Javier Glazier, MD  ticagrelor (BRILINTA) 60 MG TABS tablet Take 1 tablet (60 mg total) by mouth 2 (two) times daily. Patient not taking: Reported on 01/24/2017 10/28/14   Herminio Commons, MD  warfarin (COUMADIN) 1 MG tablet Take 1 tablet (7m) on Tuesdays and Saturdays along with a 524mtablet 01/22/17   KoHerminio CommonsMD  warfarin (COUMADIN) 5 MG tablet Take 1 tablet (5 mg total) by mouth daily. 01/31/17   KoHerminio CommonsMD    Family History Family History  Problem Relation Age of Onset  . Emphysema Mother   . Alzheimer's disease Mother   . Emphysema Sister   . Heart disease Father   . Factor V Leiden deficiency Father   . Factor V Leiden deficiency Sister   . Factor V Leiden deficiency Daughter   . Kidney cancer Paternal Grandmother     Social History Social History   Tobacco Use  . Smoking status: Current Some Day Smoker    Packs/day: 0.50    Years: 50.00    Pack years: 25.00    Types: Cigarettes    Start date: 05/10/1968  . Smokeless tobacco: Never Used  Substance Use Topics  . Alcohol use: No    Alcohol/week: 0.0 oz  . Drug use: No     Allergies   Dilaudid [hydromorphone hcl]; Minocycline hcl; Prednisone; Varenicline tartrate; and Zocor [simvastatin - high dose]   Review of Systems Review of Systems  Unable to perform ROS: Acuity of condition  Constitutional: Positive for fever.  Respiratory: Positive for cough and shortness of breath.   Cardiovascular: Positive for leg swelling.     Physical Exam Updated Vital Signs BP (!) 99/38   Pulse (!) 131   Temp (!) 103 F (39.4 C) (Oral)   Resp (!) 31   Ht _0  (1.651 m)   Wt 74.8 kg (165 lb)   SpO2 96%   BMI 27.46 kg/m   Physical Exam  Constitutional: She is oriented to person, place, and time. She appears well-developed and well-nourished. She appears distressed.  Increased work of breathing.  HENT:  Head: Normocephalic and atraumatic.  Mouth/Throat:  Oropharynx is clear and moist. No oropharyngeal exudate.  Eyes: EOM are normal. Pupils are equal, round, and reactive to light.  Neck: Normal range of motion. Neck supple.  Cardiovascular: Regular rhythm.  Tachycardia.  Pulmonary/Chest:  Increased respiratory effort.  Tachypnea.  Diminished breath sounds especially in the left lung field with end expiratory wheezes.  Abdominal: Soft. Bowel sounds are normal. There is no tenderness. There is no rebound and no guarding.  Musculoskeletal: Normal range of motion. She exhibits edema. She exhibits no tenderness.  2+ bilateral lower extremity pitting edema.  No asymmetry or calf tenderness.  Neurological: She is alert and oriented to person, place, and time.  Patient speaks in short few word sentences.  Moves all extremities without focal deficit.  Sensation intact.  Skin: Skin is warm and dry. Capillary refill takes less than 2 seconds. No rash noted. No erythema.  Psychiatric: She has a normal mood and affect. Her behavior is normal.  Nursing note and vitals reviewed.    ED Treatments / Results  Labs (all labs ordered are listed, but only abnormal results are displayed) Labs Reviewed  COMPREHENSIVE METABOLIC PANEL - Abnormal; Notable for the following components:      Result Value   Chloride 98 (*)    Glucose, Bld 114 (*)    Calcium 8.7 (*)    Albumin 3.3 (*)    All other components within normal limits  CBC WITH DIFFERENTIAL/PLATELET - Abnormal; Notable for the following components:   RDW 16.4 (*)    Monocytes Absolute 2.2 (*)    All other components within normal limits  BLOOD GAS, ARTERIAL - Abnormal; Notable for the following components:   pH, Arterial 7.299 (*)    pCO2 arterial 65.4 (*)    pO2, Arterial 72.4 (*)    Acid-Base Excess 5.1 (*)    All other components within normal limits  PROTIME-INR - Abnormal; Notable for the following components:   Prothrombin Time 18.7 (*)    All other components within normal limits    CULTURE, BLOOD (ROUTINE X 2)  CULTURE, BLOOD (ROUTINE X 2)  TROPONIN I  LACTIC ACID, PLASMA  URINALYSIS, ROUTINE W REFLEX MICROSCOPIC  BRAIN NATRIURETIC PEPTIDE  INFLUENZA PANEL BY PCR (TYPE A & B)  LACTIC ACID, PLASMA    EKG  EKG Interpretation None       Radiology Dg Chest Port 1 View  Result Date: 02/19/2017 CLINICAL DATA:  67 year old female with cough fever and shortness of breath for 3 days. Bilateral lower extremity edema. EXAM: PORTABLE CHEST 1 VIEW COMPARISON:  08/27/2013 chest radiographs and earlier. FINDINGS: Portable AP upright view at 1559 hrs. Mediastinal contours remain normal. Visualized tracheal air column is within normal limits. Stable lung volumes. Chronic increased pulmonary interstitial opacity, but there is increased pulmonary interstitium or vascularity compared to prior studies. Chronic blunting of the both costophrenic angles. No pneumothorax. No pleural effusion suspected. No confluent pulmonary opacity. IMPRESSION: Acute on chronic pulmonary interstitial opacity. Top differential considerations are viral/atypical respiratory infection and interstitial edema. No definite pleural effusion or other acute cardiopulmonary abnormality. Electronically Signed   By: Genevie Ann M.D.   On: 02/19/2017 16:07    Procedures .Critical Care Performed by: Julianne Rice, MD Authorized by: Julianne Rice, MD   Critical care provider statement:    Critical care time (minutes):  60   Critical care start time:  02/19/2017 3:30 PM   Critical care end time:  02/19/2017 10:35 PM   Critical care time was exclusive of:  Separately billable procedures and treating other patients   Critical care was necessary to treat or prevent imminent or life-threatening deterioration of the following conditions:  Sepsis and respiratory failure   Critical care was time spent personally by me on the following activities:  Development of treatment plan with patient or surrogate, discussions with  consultants, evaluation of patient's response to treatment, examination of patient, obtaining history from patient or surrogate, review of old charts, re-evaluation of patient's condition, pulse oximetry, ordering and review of radiographic studies, ordering and review of laboratory studies and ordering and performing treatments and interventions   I assumed direction of critical care for this patient from another provider in my specialty: no     Procedure Name: Intubation Date/Time: 02/19/2017  10:35 PM Performed by: Julianne Rice, MD Pre-anesthesia Checklist: Patient identified, Suction available and Patient being monitored Oxygen Delivery Method: Ambu bag Preoxygenation: Pre-oxygenation with 100% oxygen Induction Type: Rapid sequence Laryngoscope Size: Mac, 3 and Glidescope Tube size: 7.5 mm Number of attempts: 1 Placement Confirmation: ETT inserted through vocal cords under direct vision,  Positive ETCO2 and Breath sounds checked- equal and bilateral Secured at: 23 cm Tube secured with: ETT holder      (including critical care time)  Medications Ordered in ED Medications  vancomycin (VANCOCIN) 1,500 mg in sodium chloride 0.9 % 500 mL IVPB (1,500 mg Intravenous New Bag/Given 02/19/17 1621)  piperacillin-tazobactam (ZOSYN) IVPB 3.375 g (0 g Intravenous Stopped 02/19/17 1633)  sodium chloride 0.9 % bolus 500 mL (0 mLs Intravenous Stopped 02/19/17 1707)  LORazepam (ATIVAN) injection 0.5 mg (0.5 mg Intravenous Given 02/19/17 1605)  sodium chloride 0.9 % bolus 2,000 mL (2,000 mLs Intravenous New Bag/Given 02/19/17 1709)  LORazepam (ATIVAN) injection 0.5 mg (0.5 mg Intravenous Given 02/19/17 1743)     Initial Impression / Assessment and Plan / ED Course  I have reviewed the triage vital signs and the nursing notes.  Pertinent labs & imaging results that were available during my care of the patient were reviewed by me and considered in my medical decision making (see chart for  details).     Patient is breathing much more comfortably on BiPAP.  She is requiring some small doses of Ativan for anxiety.  Technically meets sepsis criteria.  Started on broad-spectrum antibiotics and IV fluids.  Initial lactic acid is normal.  Will discuss with hospitalist regarding admission.  Patient with worsening PCO2 retention and acidosis despite BiPAP.  Patient also is becoming less responsive.  Initially tried removing fentanyll patches, giving dose of Narcan and increasing respiratory rate.  Continued worsening of acidosis and CO2 retention.  Decision made to intubate. Final Clinical Impressions(s) / ED Diagnoses   Final diagnoses:  Acute respiratory failure with hypoxia [facility])  COPD exacerbation [facility])    ED Discharge Orders    None       Julianne Rice, MD 02/19/17 1750    Julianne Rice, MD 02/19/17 2246

## 2017-02-19 NOTE — ED Notes (Signed)
EDPO at RT at the bedside,  MD ordering Bipap

## 2017-02-19 NOTE — ED Notes (Signed)
Adjustments made to Bipap , pt now 94%, Moving in bed, sitting up, more alert

## 2017-02-19 NOTE — ED Triage Notes (Signed)
Comes in EMS, on duo neb, x2 in route, IV Solu medrol 125mg  given,  Pt coughing x 3 days, using OTC meds, without relief, uses 2 L home 02, fever, pt have Fentanyl patch, pt cut and put in 4 places, states that helps better. Pt on monitor, on 4 L here, edema bilateral lower extremities

## 2017-02-19 NOTE — ED Notes (Signed)
Check allergy for Solumedrol with admitting MD. Pt had by EMS in route

## 2017-02-19 NOTE — H&P (Addendum)
TRH H&P   Patient Demographics:    [name], is a 67 y.o. female  MRN: [id number]   DOB - 08/07/1950  Admit Date - 02/19/2017  Outpatient Primary MD for the patient is [name], NP  Referring MD/NP/PA: Julianne Rice  Outpatient Specialists:     Patient coming from: home  Chief Complaint  Patient presents with  . Respiratory Distress      HPI:    [name]  is a 67 y.o. female, w hx of CAD, Hypothyroidism, PE, Factor V leiden, Copd on home o2, apparently c/o increase in dyspnea since last nite.  Slight cough, dry-yellow sputum.  Denies fever, chills, cp, palp, n/v, diarrhea, brbpr, black stool.  Pt presents to ED due to increase in dyspnea.    In ED,  CXR IMPRESSION: Acute on chronic pulmonary interstitial opacity. Top differential considerations are viral/atypical respiratory infection and interstitial edema. No definite pleural effusion or other acute cardiopulmonary abnormality.  Na 140, K 3.8, Bun 15, Creatinine 0.94 Ast 24, Alt 14 Wbc 8.8, Hgb 12.1, Plt 219 Trop <0.03  BNP 121.0  Influenza A positive  Pt will be admitted Copd exacerbation, influenza A.         Review of systems:    In addition to the HPI above,  No Fever-chills, No Headache, No changes with Vision or hearing, No problems swallowing food or Liquids, No Chest pain, No Abdominal pain, No Nausea or Vommitting, Bowel movements are regular, No Blood in stool or Urine, No dysuria, No new skin rashes or bruises, No new joints pains-aches,  No new weakness, tingling, numbness in any extremity, No recent weight gain or loss, No polyuria, polydypsia or polyphagia, No significant Mental Stressors.  A full 10 point Review of Systems was done, except as stated above, all other Review of Systems were negative.   With Past History of the following :    Past Medical  History:  Diagnosis Date  . Acute MI ([place])    x4, code blue x3  . Arteriosclerotic cardiovascular disease (ASCVD) 2002   Inf STEMI-2002. 2003-cutting balloon + brachytherapy for restenosis; subsequent acute stent thrombosis 06/2010 requiring 2 separate interventions (Zeta stent, then repeat cath with thrombectomy). focal basal inf AK, nl EF; 03/2011: Patent stents, minor nonobst  residual dz, nl EF; neg stress nuclear in 2008 and stress echo in 2009  . Chronic anticoagulation    Warfarin plus ticagrelor  . Chronic respiratory failure ([place]) 08/27/2013   On 2L 02  . COPD (chronic obstructive pulmonary disease) ([place])    02 dependent  . Factor V Leiden, prothrombin gene mutation ([place]) 2006  . Hyperlipidemia   . Hypothyroidism   . Noncompliance   . Pelvic fracture ([place]) 2009  . Pulmonary embolism ([place]) 2006   Associated with deep vein thrombosis-2006; + factor V Leiden  . Tobacco abuse    50 pack years  Past Surgical History:  Procedure Laterality Date  . COLONOSCOPY  Approximately 2000   Negative screening study  . CORONARY ANGIOPLASTY  2002, 2003, 2012  . LEFT AND RIGHT HEART CATHETERIZATION WITH CORONARY ANGIOGRAM N/A 04/03/2011   Procedure: LEFT AND RIGHT HEART CATHETERIZATION WITH CORONARY ANGIOGRAM;  Surgeon: Sherren Mocha, MD;  Location: [facility] C[facility] LAB;  Service: Cardiovascular;  Laterality: N/A;      Social History:     Social History   Tobacco Use  . Smoking status: Current Some Day Smoker    Packs/day: 0.50    Years: 50.00    Pack years: 25.00    Types: Cigarettes    Start date: 05/10/1968  . Smokeless tobacco: Never Used  Substance Use Topics  . Alcohol use: No    Alcohol/week: 0.0 oz     Lives - at home  Mobility - walks by self   Family History :     Family History  Problem Relation Age of Onset  . Emphysema Mother   . Alzheimer's disease Mother   . Emphysema Sister   . Heart disease Father   . Factor V Leiden deficiency Father   . Factor V Leiden  deficiency Sister   . Factor V Leiden deficiency Daughter   . Kidney cancer Paternal Grandmother       Home Medications:   Prior to Admission medications   Medication Sig Start Date End Date Taking? Authorizing Provider  albuterol (PROVENTIL) (2.5 MG/3ML) 0.083% nebulizer solution USE 1 VIAL BY NEBULIZER EVERY 4 HOURS AS NEEDED FOR WHEEZING. DX: J44.9 01/24/17   Magdalen Spatz, NP  budesonide (PULMICORT) 0.5 MG/2ML nebulizer solution Take 2 mLs (0.5 mg total) by nebulization 2 (two) times daily. Dx: J43.9 01/24/17   Magdalen Spatz, NP  fentaNYL (DURAGESIC - DOSED MCG/HR) 50 MCG/HR Place 50 mcg onto the skin every 3 (three) days.    [provider]  fish oil-omega-3 fatty acids 1000 MG capsule Take 2 g by mouth daily.      [provider]  ipratropium-albuterol (DUONEB) 0.5-2.5 (3) MG/3ML SOLN 1 vial via nebulizer at breakfast, lunch, dinner and bedtime daily. May take 2 additional treatments on bad day Dx: J43.9 01/24/17   Magdalen Spatz, NP  levothyroxine (SYNTHROID, LEVOTHROID) 125 MCG tablet take 1 tablet by mouth once daily 11/24/16   Nafziger, Tommi Rumps, NP  Magnesium 500 MG CAPS Take 500 mg by mouth.    [provider]  nitroGLYCERIN (NITROSTAT) 0.4 MG SL tablet Place 1 tablet (0.4 mg total) under the tongue every 5 (five) minutes as needed for chest pain. 10/28/14   Herminio Commons, MD  Respiratory Therapy Supplies (FLUTTER) DEVI Use as directed 05/14/15   Javier Glazier, MD  ticagrelor (BRILINTA) 60 MG TABS tablet Take 1 tablet (60 mg total) by mouth 2 (two) times daily. Patient not taking: Reported on 01/24/2017 10/28/14   Herminio Commons, MD  warfarin (COUMADIN) 1 MG tablet Take 1 tablet (1mg ) on Tuesdays and Saturdays along with a 5mg  tablet 01/22/17   Herminio Commons, MD  warfarin (COUMADIN) 5 MG tablet Take 1 tablet (5 mg total) by mouth daily. 01/31/17   Herminio Commons, MD     Allergies:     Allergies  Allergen Reactions  . Dilaudid  [Hydromorphone Hcl] Hives and Nausea Only  . Minocycline Hcl     REACTION: Dizzy  . Prednisone     REACTION: feels like throat swelling  . Varenicline Tartrate  REACTION: Dizzy(chantix)   . Zocor [Simvastatin - High Dose] Other (See Comments)    myalgia     Physical Exam:   Vitals  Blood pressure (!) 110/56, pulse (!) 109, temperature (!) 103 F (39.4 C), temperature source Oral, resp. rate (!) 22, height 5\' 5"  (1.651 m), weight 74.8 kg (165 lb), SpO2 100 %.   1. General  lying in bed in NAD,   2. Normal affect and insight, Not Suicidal or Homicidal, Awake Alert, Oriented X 1.  3. No F.N deficits, ALL C.Nerves Intact, Strength 5/5 all 4 extremities, Sensation intact all 4 extremities, Plantars down going.  4. Ears and Eyes appear Normal, Conjunctivae clear, PERRLA. Moist Oral Mucosa.  5. Supple Neck, No JVD, No cervical lymphadenopathy appriciated, No Carotid Bruits.  6. Symmetrical Chest wall movement, poor air movement + bilateral exp wheezing, no crackle   7. Borderline tachy s1, s2, , No Gallops, Rubs or Murmurs, No Parasternal Heave.  8. Positive Bowel Sounds, Abdomen Soft, No tenderness, No organomegaly appriciated,No rebound -guarding or rigidity.  9.  No Cyanosis, Normal Skin Turgor, No Skin Rash or Bruise.  10. Good muscle tone,  joints appear normal , no effusions, Normal ROM.  11. No Palpable Lymph Nodes in Neck or Axillae     Data Review:    CBC Recent Labs  Lab 02/19/17 1539  WBC 8.8  HGB 12.1  HCT 40.2  PLT 219  MCV 94.1  MCH 28.3  MCHC 30.1  RDW 16.4*  LYMPHSABS 1.6  MONOABS 2.2*  EOSABS 0.1  BASOSABS 0.0   ------------------------------------------------------------------------------------------------------------------  Chemistries  Recent Labs  Lab 02/19/17 1539  NA 140  K 3.8  CL 98*  CO2 30  GLUCOSE 114*  BUN 15  CREATININE 0.94  CALCIUM 8.7*  AST 24  ALT 14  ALKPHOS 113  BILITOT 0.4    ------------------------------------------------------------------------------------------------------------------ estimated creatinine clearance is 59.6 mL/min (by C-G formula based on SCr of 0.94 mg/dL). ------------------------------------------------------------------------------------------------------------------ No results for input(s): TSH, T4TOTAL, T3FREE, THYROIDAB in the last 72 hours.  Invalid input(s): FREET3  Coagulation profile Recent Labs  Lab 02/19/17 1539  INR 1.58   ------------------------------------------------------------------------------------------------------------------- No results for input(s): DDIMER in the last 72 hours. -------------------------------------------------------------------------------------------------------------------  Cardiac Enzymes Recent Labs  Lab 02/19/17 1539  TROPONINI <0.03   ------------------------------------------------------------------------------------------------------------------    Component Value Date/Time   BNP 121.0 (H) 02/19/2017 1540     ---------------------------------------------------------------------------------------------------------------  Urinalysis    Component Value Date/Time   COLORURINE YELLOW 12/06/2015 1520   APPEARANCEUR HAZY (A) 12/06/2015 1520   LABSPEC 1.020 12/06/2015 1520   PHURINE 6.5 12/06/2015 1520   GLUCOSEU NEGATIVE 12/06/2015 1520   HGBUR LARGE (A) 12/06/2015 1520   BILIRUBINUR n 09/21/2016 1432   KETONESUR TRACE (A) 12/06/2015 1520   PROTEINUR 1+ 09/21/2016 1432   PROTEINUR 100 (A) 12/06/2015 1520   UROBILINOGEN 0.2 09/21/2016 1432   UROBILINOGEN 0.2 04/13/2009 2025   NITRITE n 09/21/2016 1432   NITRITE NEGATIVE 12/06/2015 1520   LEUKOCYTESUR Negative 09/21/2016 1432    ----------------------------------------------------------------------------------------------------------------   Imaging Results:    Dg Chest Port 1 View  Result Date: 02/19/2017 CLINICAL  DATA:  67 year old female with cough fever and shortness of breath for 3 days. Bilateral lower extremity edema. EXAM: PORTABLE CHEST 1 VIEW COMPARISON:  08/27/2013 chest radiographs and earlier. FINDINGS: Portable AP upright view at 1559 hrs. Mediastinal contours remain normal. Visualized tracheal air column is within normal limits. Stable lung volumes. Chronic increased pulmonary interstitial opacity, but there is increased pulmonary  interstitium or vascularity compared to prior studies. Chronic blunting of the both costophrenic angles. No pneumothorax. No pleural effusion suspected. No confluent pulmonary opacity. IMPRESSION: Acute on chronic pulmonary interstitial opacity. Top differential considerations are viral/atypical respiratory infection and interstitial edema. No definite pleural effusion or other acute cardiopulmonary abnormality. Electronically Signed   By: Genevie Ann M.D.   On: 02/19/2017 16:07       Assessment & Plan:    Principal Problem:   COPD exacerbation (HCC) Active Problems:   Tachycardia   Influenza A    Copd exacerbation Solumedrol 80mg  iv q8h (per pharmacy received in past) zithromax 500mg  iv qday spiriva 1puff qday Albuterol neb q6h and q6h prn Bipap  Influenza A tamiflu 75mg  po bid   Tachycardia Tele Trop I q6h x3 Tsh D dimer, if positive then CTA chest r/o PE Cardiac echo  H/o PE, Factor 5 leiden Coumadin pharmacy to dose   DVT Prophylaxis coumadin ,  SCDs   AM Labs Ordered, also please review Full Orders  Family Communication: Admission, patients condition and plan of care including tests being ordered have been discussed with the patient  who indicate understanding and agree with the plan and Code Status.  Code Status FULL CODE  Likely DC to  home  Condition GUARDED   Consults called:none  Admission status: inpatient    Time spent in minutes : 45   Jani Gravel M.D on 02/19/2017 at 7:40 PM  Between 7am to 7pm - Pager - [phone]    After 7pm go to [email or web address] - password [facility]  [facility] - Office  [phone]

## 2017-02-19 NOTE — ED Notes (Signed)
Pt sitting up in bed pulling at mask

## 2017-02-19 NOTE — ED Notes (Signed)
Pt has calmed down, leaving bipap mask in place, family In the room. Pt sitting up in bed

## 2017-02-19 NOTE — Telephone Encounter (Signed)
Please advise 

## 2017-02-19 NOTE — ED Notes (Signed)
Pt wet, cleaned and dry,foul smelling urine

## 2017-02-19 NOTE — ED Notes (Signed)
Pt c/o she can't breathe.  Pt on bipap.  Sat 96%.  Notified edp.  Dr. Lita Mains at bedside.

## 2017-02-19 NOTE — Progress Notes (Signed)
ANTICOAGULATION CONSULT NOTE - Initial Consult  Pharmacy Consult for Warfarin Indication: Chronic warfarin, Hx PE/DVT/+ factor V Leiden  Allergies  Allergen Reactions  . Dilaudid [Hydromorphone Hcl] Hives and Nausea Only  . Minocycline Hcl     REACTION: Dizzy  . Prednisone     REACTION: feels like throat swelling  . Varenicline Tartrate     REACTION: Dizzy(chantix)   . Zocor [Simvastatin - High Dose] Other (See Comments)    myalgia    Patient Measurements: Height: 5\' 5"  (165.1 cm) Weight: 165 lb (74.8 kg) IBW/kg (Calculated) : 57 Heparin Dosing Weight:   Vital Signs: Temp: 103 F (39.4 C) (02/11 1536) Temp Source: Oral (02/11 1536) BP: 109/49 (02/11 1800) Pulse Rate: 113 (02/11 1800)  Labs: Recent Labs    02/19/17 1539  HGB 12.1  HCT 40.2  PLT 219  LABPROT 18.7*  INR 1.58  CREATININE 0.94  TROPONINI <0.03    Estimated Creatinine Clearance: 59.6 mL/min (by C-G formula based on SCr of 0.94 mg/dL).   Medical History: Past Medical History:  Diagnosis Date  . Acute MI ([place])    x4, code blue x3  . Arteriosclerotic cardiovascular disease (ASCVD) 2002   Inf STEMI-2002. 2003-cutting balloon + brachytherapy for restenosis; subsequent acute stent thrombosis 06/2010 requiring 2 separate interventions (Zeta stent, then repeat cath with thrombectomy). focal basal inf AK, nl EF; 03/2011: Patent stents, minor nonobst  residual dz, nl EF; neg stress nuclear in 2008 and stress echo in 2009  . Chronic anticoagulation    Warfarin plus ticagrelor  . Chronic respiratory failure ([place]) 08/27/2013   On 2L 02  . COPD (chronic obstructive pulmonary disease) ([place])    02 dependent  . Factor V Leiden, prothrombin gene mutation ([place]) 2006  . Hyperlipidemia   . Hypothyroidism   . Noncompliance   . Pelvic fracture ([place]) 2009  . Pulmonary embolism ([place]) 2006   Associated with deep vein thrombosis-2006; + factor V Leiden  . Tobacco abuse    50 pack years    Medications:   (Not in  a hospital admission)  Home warfarin 5mg  daily except 6mg  Tuesday, Thursday and Saturdays per recent ambulatory anticoag flow sheet.  Assessment: Okay for Protocol, INR below goal. Goal of Therapy:  INR 2-3   Plan:  Warfarin 7.5mg  PO x 1 Daily PT/INR. Monitor for signs and symptoms of bleeding.   Pricilla Larsson 02/19/2017,6:44 PM

## 2017-02-19 NOTE — ED Notes (Signed)
Called EDP, pt 87% on Bipap, told to call RT to increase FI02, pt will open eye when called by name

## 2017-02-19 NOTE — ED Notes (Signed)
Intubated with 7 1/2 ET tube,  23 at the lip, ordered NG tube and Chest xray

## 2017-02-19 NOTE — Telephone Encounter (Signed)
Copied from [place]. Topic: General - Other >> Feb 19, 2017 10:09 AM Yvette Rack wrote: Reason for CRM: patient calling stating that she would like a RX for Tamiflu she has runny nose, body ache, fever, cough, nausea and  chills pt is on blood thinners pt doesn't feel well enough to get up to come into office please send it to the [place], [place] to department's [place].  Can you ask Dr. Carita Pian about this request?

## 2017-02-19 NOTE — ED Notes (Signed)
Lab at the bedside, pt on bipap, v]ery anxoius

## 2017-02-19 NOTE — ED Notes (Signed)
Sitting up to intubate pt

## 2017-02-20 ENCOUNTER — Other Ambulatory Visit (HOSPITAL_COMMUNITY): Payer: Medicare Other

## 2017-02-20 ENCOUNTER — Encounter (HOSPITAL_COMMUNITY): Payer: Self-pay | Admitting: Emergency Medicine

## 2017-02-20 ENCOUNTER — Inpatient Hospital Stay (HOSPITAL_COMMUNITY): Payer: Medicare Other

## 2017-02-20 DIAGNOSIS — J9622 Acute and chronic respiratory failure with hypercapnia: Secondary | ICD-10-CM

## 2017-02-20 DIAGNOSIS — J9621 Acute and chronic respiratory failure with hypoxia: Secondary | ICD-10-CM

## 2017-02-20 DIAGNOSIS — Z86711 Personal history of pulmonary embolism: Secondary | ICD-10-CM

## 2017-02-20 LAB — CBC
HEMATOCRIT: 37.2 % (ref 36.0–46.0)
Hemoglobin: 11.2 g/dL — ABNORMAL LOW (ref 12.0–15.0)
MCH: 28.5 pg (ref 26.0–34.0)
MCHC: 30.1 g/dL (ref 30.0–36.0)
MCV: 94.7 fL (ref 78.0–100.0)
Platelets: 184 10*3/uL (ref 150–400)
RBC: 3.93 MIL/uL (ref 3.87–5.11)
RDW: 16.4 % — AB (ref 11.5–15.5)
WBC: 5.5 10*3/uL (ref 4.0–10.5)

## 2017-02-20 LAB — URINALYSIS, MICROSCOPIC (REFLEX)

## 2017-02-20 LAB — BLOOD GAS, ARTERIAL
ACID-BASE EXCESS: 0.9 mmol/L (ref 0.0–2.0)
ACID-BASE EXCESS: 0.8 mmol/L (ref 0.0–2.0)
BICARBONATE: 23.8 mmol/L (ref 20.0–28.0)
BICARBONATE: 22.6 mmol/L (ref 20.0–28.0)
DRAWN BY: 270161
Drawn by: 31777
FIO2: 50
FIO2: 40
LHR: 12 {breaths}/min
LHR: 20 {breaths}/min
MECHVT: 450 mL
O2 SAT: 97.6 %
O2 Saturation: 98.3 %
PATIENT TEMPERATURE: 37
PCO2 ART: 74.4 mmHg — AB (ref 32.0–48.0)
PEEP/CPAP: 5 cmH2O
PEEP/CPAP: 5 cmH2O
PH ART: 7.23 — AB (ref 7.350–7.450)
PH ART: 7.201 — AB (ref 7.350–7.450)
PO2 ART: 114 mmHg — AB (ref 83.0–108.0)
Patient temperature: 37.4
VT: 450 mL
pCO2 arterial: 66.9 mmHg (ref 32.0–48.0)
pO2, Arterial: 98.3 mmHg (ref 83.0–108.0)

## 2017-02-20 LAB — MRSA PCR SCREENING: MRSA by PCR: NEGATIVE

## 2017-02-20 LAB — COMPREHENSIVE METABOLIC PANEL
ALBUMIN: 2.6 g/dL — AB (ref 3.5–5.0)
ALT: 15 U/L (ref 14–54)
AST: 22 U/L (ref 15–41)
Alkaline Phosphatase: 88 U/L (ref 38–126)
Anion gap: 8 (ref 5–15)
BILIRUBIN TOTAL: 0.4 mg/dL (ref 0.3–1.2)
BUN: 17 mg/dL (ref 6–20)
CHLORIDE: 102 mmol/L (ref 101–111)
CO2: 28 mmol/L (ref 22–32)
CREATININE: 0.7 mg/dL (ref 0.44–1.00)
Calcium: 8 mg/dL — ABNORMAL LOW (ref 8.9–10.3)
GFR calc Af Amer: >60 mL/min (ref >60)
GFR calc non Af Amer: >60 mL/min (ref >60)
GLUCOSE: 128 mg/dL — AB (ref 65–99)
POTASSIUM: 4.3 mmol/L (ref 3.5–5.1)
Sodium: 138 mmol/L (ref 135–145)
Total Protein: 6.1 g/dL — ABNORMAL LOW (ref 6.5–8.1)

## 2017-02-20 LAB — TROPONIN I
Troponin I: <0.03 ng/mL (ref <0.03)
Troponin I: <0.03 ng/mL (ref <0.03)

## 2017-02-20 LAB — URINALYSIS, ROUTINE W REFLEX MICROSCOPIC
BILIRUBIN URINE: NEGATIVE
GLUCOSE, UA: NEGATIVE mg/dL
Ketones, ur: NEGATIVE mg/dL
Leukocytes, UA: NEGATIVE
Nitrite: NEGATIVE
Protein, ur: 100 mg/dL — AB
pH: 6 (ref 5.0–8.0)

## 2017-02-20 LAB — PROTIME-INR
INR: 1.61
Prothrombin Time: 19 seconds — ABNORMAL HIGH (ref 11.4–15.2)

## 2017-02-20 LAB — D-DIMER, QUANTITATIVE (NOT AT ARMC): D DIMER QUANT: 1.94 ug{FEU}/mL — AB (ref 0.00–0.50)

## 2017-02-20 MED ORDER — MIDAZOLAM HCL 2 MG/2ML IJ SOLN
INTRAMUSCULAR | Status: AC
  Filled 2017-02-20: qty 2

## 2017-02-20 MED ORDER — IOPAMIDOL (ISOVUE-370) INJECTION 76%
100.0000 mL | Freq: Once | INTRAVENOUS | Status: AC | PRN
  Administered 2017-02-20: 100 mL via INTRAVENOUS

## 2017-02-20 MED ORDER — ALBUTEROL SULFATE (2.5 MG/3ML) 0.083% IN NEBU
2.5000 mg | INHALATION_SOLUTION | Freq: Four times a day (QID) | RESPIRATORY_TRACT | Status: DC | PRN
  Administered 2017-02-26: 2.5 mg via RESPIRATORY_TRACT
  Filled 2017-02-20: qty 3

## 2017-02-20 MED ORDER — ORAL CARE MOUTH RINSE
15.0000 mL | Freq: Four times a day (QID) | OROMUCOSAL | Status: DC
  Administered 2017-02-20 – 2017-02-21 (×6): 15 mL via OROMUCOSAL

## 2017-02-20 MED ORDER — LEVOTHYROXINE SODIUM 25 MCG PO TABS: 125.0000 ug | ORAL_TABLET | Freq: Every day | ORAL | Status: DC

## 2017-02-20 MED ORDER — ENOXAPARIN SODIUM 80 MG/0.8ML ~~LOC~~ SOLN
80.0000 mg | Freq: Two times a day (BID) | SUBCUTANEOUS | Status: DC
  Administered 2017-02-20 – 2017-03-05 (×27): 80 mg via SUBCUTANEOUS
  Filled 2017-02-20 (×27): qty 0.8

## 2017-02-20 MED ORDER — SODIUM CHLORIDE 0.9 % IV SOLN
INTRAVENOUS | Status: AC
  Administered 2017-02-20: 02:00:00 via INTRAVENOUS

## 2017-02-20 MED ORDER — SODIUM CHLORIDE 0.9 % IV SOLN
500.0000 mg | INTRAVENOUS | Status: DC
  Administered 2017-02-20 – 2017-02-26 (×7): 500 mg via INTRAVENOUS
  Filled 2017-02-20 (×9): qty 500

## 2017-02-20 MED ORDER — LEVOTHYROXINE SODIUM 100 MCG IV SOLR
62.5000 ug | Freq: Every day | INTRAVENOUS | Status: DC
  Administered 2017-02-20 – 2017-03-02 (×11): 62.5 ug via INTRAVENOUS
  Filled 2017-02-20 (×11): qty 5

## 2017-02-20 MED ORDER — FENTANYL CITRATE (PF) 2500 MCG/50ML IJ SOLN
INTRAMUSCULAR | Status: AC
  Filled 2017-02-20: qty 50

## 2017-02-20 MED ORDER — SODIUM CHLORIDE 0.9 % IV SOLN
INTRAVENOUS | Status: AC
  Administered 2017-02-20: 18:00:00 via INTRAVENOUS

## 2017-02-20 MED ORDER — CHLORHEXIDINE GLUCONATE 0.12% ORAL RINSE (MEDLINE KIT)
15.0000 mL | Freq: Two times a day (BID) | OROMUCOSAL | Status: DC
  Administered 2017-02-20 – 2017-02-21 (×3): 15 mL via OROMUCOSAL

## 2017-02-20 MED ORDER — MIDAZOLAM HCL 2 MG/2ML IJ SOLN
2.0000 mg | INTRAMUSCULAR | Status: DC | PRN
  Administered 2017-02-20 – 2017-02-23 (×13): 2 mg via INTRAVENOUS
  Filled 2017-02-20 (×14): qty 2

## 2017-02-20 MED ORDER — VANCOMYCIN HCL IN DEXTROSE 750-5 MG/150ML-% IV SOLN
INTRAVENOUS | Status: AC
  Filled 2017-02-20: qty 150

## 2017-02-20 MED ORDER — FENTANYL 50 MCG/HR TD PT72: 50.0000 ug | MEDICATED_PATCH | TRANSDERMAL | Status: DC

## 2017-02-20 MED ORDER — VANCOMYCIN HCL IN DEXTROSE 1-5 GM/200ML-% IV SOLN
1000.0000 mg | Freq: Two times a day (BID) | INTRAVENOUS | Status: DC
  Administered 2017-02-20 – 2017-02-22 (×4): 1000 mg via INTRAVENOUS
  Filled 2017-02-20 (×4): qty 200

## 2017-02-20 MED ORDER — SODIUM CHLORIDE 0.9 % IV SOLN
0.0000 ug/h | INTRAVENOUS | Status: DC
  Administered 2017-02-20: 25 ug/h via INTRAVENOUS
  Administered 2017-02-20: 225 ug/h via INTRAVENOUS
  Administered 2017-02-21: 250 ug/h via INTRAVENOUS
  Administered 2017-02-21: 200 ug/h via INTRAVENOUS
  Administered 2017-02-22 (×2): 350 ug/h via INTRAVENOUS
  Administered 2017-02-22: 325 ug/h via INTRAVENOUS
  Administered 2017-02-23: 300 ug/h via INTRAVENOUS
  Administered 2017-02-23: 350 ug/h via INTRAVENOUS
  Administered 2017-02-24 (×2): 300 ug/h via INTRAVENOUS
  Administered 2017-02-24: 75 ug/h via INTRAVENOUS
  Administered 2017-02-25: 300 ug/h via INTRAVENOUS
  Administered 2017-02-25: 250 ug/h via INTRAVENOUS
  Administered 2017-02-26: 12.5 ug/h via INTRAVENOUS
  Filled 2017-02-20 (×12): qty 50

## 2017-02-20 NOTE — Progress Notes (Signed)
ANTICOAGULATION CONSULT NOTE - Initial Consult  Pharmacy Consult for Warfarin >> LOVENOX Indication: Chronic warfarin, Hx PE/DVT/+ factor V Leiden  Allergies  Allergen Reactions  . Dilaudid [Hydromorphone Hcl] Hives and Nausea Only  . Minocycline Hcl     REACTION: Dizzy  . Prednisone     REACTION: feels like throat swelling  . Varenicline Tartrate     REACTION: Dizzy(chantix)   . Zocor [Simvastatin - High Dose] Other (See Comments)    myalgia   Patient Measurements: Height: 5\' 5"  (165.1 cm) Weight: 175 lb 14.8 oz (79.8 kg) IBW/kg (Calculated) : 57  Vital Signs: Temp: 96.6 F (35.9 C) (02/12 0749) Temp Source: Oral (02/12 0400) BP: 102/57 (02/12 0445) Pulse Rate: 65 (02/12 0749)  Labs: Recent Labs    02/19/17 1539 02/20/17 0042 02/20/17 0619  HGB 12.1  --  11.2*  HCT 40.2  --  37.2  PLT 219  --  184  LABPROT 18.7*  --  19.0*  INR 1.58  --  1.61  CREATININE 0.94  --  0.70  TROPONINI <0.03 <0.03 <0.03   Estimated Creatinine Clearance: 72.2 mL/min (by C-G formula based on SCr of 0.7 mg/dL).  Medical History: Past Medical History:  Diagnosis Date  . Acute MI ([place])    x4, code blue x3  . Arteriosclerotic cardiovascular disease (ASCVD) 2002   Inf STEMI-2002. 2003-cutting balloon + brachytherapy for restenosis; subsequent acute stent thrombosis 06/2010 requiring 2 separate interventions (Zeta stent, then repeat cath with thrombectomy). focal basal inf AK, nl EF; 03/2011: Patent stents, minor nonobst  residual dz, nl EF; neg stress nuclear in 2008 and stress echo in 2009  . Chronic anticoagulation    Warfarin plus ticagrelor  . Chronic respiratory failure ([place]) 08/27/2013   On 2L 02  . COPD (chronic obstructive pulmonary disease) ([place])    02 dependent  . Factor V Leiden, prothrombin gene mutation ([place]) 2006  . Hyperlipidemia   . Hypothyroidism   . Noncompliance   . Pelvic fracture ([place]) 2009  . Pulmonary embolism ([place]) 2006   Associated with deep vein  thrombosis-2006; + factor V Leiden  . Tobacco abuse    50 pack years   Medications:  Medications Prior to Admission  Medication Sig Dispense Refill Last Dose  . albuterol (PROVENTIL) (2.5 MG/3ML) 0.083% nebulizer solution USE 1 VIAL BY NEBULIZER EVERY 4 HOURS AS NEEDED FOR WHEEZING. DX: J44.9 375 vial 0   . budesonide (PULMICORT) 0.5 MG/2ML nebulizer solution Take 2 mLs (0.5 mg total) by nebulization 2 (two) times daily. Dx: J43.9 120 mL 3   . fentaNYL (DURAGESIC - DOSED MCG/HR) 50 MCG/HR Place 50 mcg onto the skin every 3 (three) days.   Taking  . fish oil-omega-3 fatty acids 1000 MG capsule Take 2 g by mouth daily.     Taking  . ipratropium-albuterol (DUONEB) 0.5-2.5 (3) MG/3ML SOLN 1 vial via nebulizer at breakfast, lunch, dinner and bedtime daily. May take 2 additional treatments on bad day Dx: J43.9 360 mL 3   . levothyroxine (SYNTHROID, LEVOTHROID) 125 MCG tablet take 1 tablet by mouth once daily 90 tablet 2 Taking  . Magnesium 500 MG CAPS Take 500 mg by mouth.   Taking  . nitroGLYCERIN (NITROSTAT) 0.4 MG SL tablet Place 1 tablet (0.4 mg total) under the tongue every 5 (five) minutes as needed for chest pain. 25 tablet 3 Taking  . Respiratory Therapy Supplies (FLUTTER) DEVI Use as directed 1 each 0 Taking  . ticagrelor (BRILINTA) 60 MG TABS  tablet Take 1 tablet (60 mg total) by mouth 2 (two) times daily. (Patient not taking: Reported on 01/24/2017) 60 tablet 11 Not Taking  . warfarin (COUMADIN) 1 MG tablet Take 1 tablet (1mg ) on Tuesdays and Saturdays along with a 5mg  tablet 30 tablet 4 Taking  . warfarin (COUMADIN) 5 MG tablet Take 1 tablet (5 mg total) by mouth daily. 90 tablet 3     Home warfarin 5mg  daily except 6mg  Tuesday, Thursday and Saturdays per recent ambulatory anticoag flow sheet.  Pt in ICU now intubated and ventilated.  Warfarin to be switched to Lovenox while in ICU.    Assessment: INR below goal.  Change to full dose Lovenox while intubated in ICU. Goal of Therapy:   INR 2-3 when on Coumadin   Plan:  Lovenox 1mg /Kg SQ q12hrs Monitor for CBC, signs and symptoms of bleeding.   Hart Robinsons A 02/20/2017,8:43 AM

## 2017-02-20 NOTE — Progress Notes (Signed)
eLink Physician-Brief Progress Note Patient Name: [name] DOB: August 01, 1950 MRN: [id number]   Date of Service  02/20/2017  HPI/Events of Note  Intubated and ventilated - Request for sedation and Foley catheter.   eICU Interventions  Will order: 1. Fentanyl IV infusion. Titrate to RASS = 0 to -1. 2. Place Foley Catheter.      Intervention Category Major Interventions: Respiratory failure - evaluation and management  Sommer,Steven [name] 02/20/2017, 12:20 AM

## 2017-02-20 NOTE — Telephone Encounter (Signed)
She is in the hospital currently

## 2017-02-20 NOTE — Progress Notes (Signed)
[facility] PROGRESS NOTE  [name] IOM:[id number] DOB: Mar 12, 1950 DOA: 02/19/2017 PCP: [name], NP  Interim summary and HPI [name]  is a 67 y.o. female, w hx of CAD, Hypothyroidism, PE, Factor V leiden, Copd on home o2, apparently c/o increase in dyspnea for 24 hours prior to admission.  Slight cough, dry-yellow sputum.  Denies fever, chills, cp, palp, n/v, diarrhea, brbpr, black stool.  Pt presents to ED due to increase in dyspnea.    CXR demonstrated acute on chronic pulmonary interstitial opacity; CTA neg for PE. Positive influenza A  Assessment/Plan: 1-acute on chronic resp failure with hypoxia and hypercapnia: in the setting of COPD exacerbation and influenza A infection . -patient initially treated with BIPAP, with very little improvement -COs increased over 100 and required ventilatory support -will follow rec's from pulmonary/critical care -antibiotics broaden  -continue steroids, nebulizer treatment and pulmicort  -follow ABG and CXR to assess/guide weaning process  -started on Tamiflu  2-tachycardia -in presentation of acute infection, albuterol treatment and anxiety -CTA neg for PE -TSH WNL -echo pending  3-hypothyroidism -continue synthroid  -half dose while given IV  4-GI prophylaxis -continue famotidine BID  5-Factor V mutation and hx of PE -while intubated will use lovenox per pharmacy -resume coumadin once stable and able to tolerate PO's   Code Status: Full Family Communication: no family at bedside  Disposition Plan: remains in ICU, continue ventilatory support, continue IV antibiotics, steroids and nebulizer.   Consultants:  Pulmonary/critical care service  Procedures:  See below for x-ray reports  Ventilator dependent   2-D echo: pending   Antibiotics:  Vancomycin, zosyn and zithromax  2/11  HPI/Subjective: Sedated and mechanically ventilated; no fever.  Objective: Vitals:   02/20/17 1500 02/20/17 1620   BP:    Pulse:  79  Resp:  14  Temp:  (!) 97.2 F (36.2 C)  SpO2: 96% 97%    Intake/Output Summary (Last 24 hours) at 02/20/2017 1650 Last data filed at 02/20/2017 1400 Gross per 24 hour  Intake 3150 ml  Output -  Net 3150 ml   Filed Weights   02/20/17 0000 02/20/17 0400 02/20/17 0500  Weight: 78.5 kg (173 lb 1 oz) 79.8 kg (175 lb 14.8 oz) 79.8 kg (175 lb 14.8 oz)    Exam:   General: afebrile, sedated, with ventilatory support; no icterus.  Cardiovascular: S1 and S2, no rubs, no gallops  Respiratory: positive exp wheezing, diminish BS at bases and positive rhonchi.  Abdomen: soft, NT, ND, positive BS  Musculoskeletal: no edema, no cyanosis, no clubbing   Data Reviewed: Basic Metabolic Panel: Recent Labs  Lab 02/19/17 1539 02/20/17 0619  NA 140 138  K 3.8 4.3  CL 98* 102  CO2 30 28  GLUCOSE 114* 128*  BUN 15 17  CREATININE 0.94 0.70  CALCIUM 8.7* 8.0*   Liver Function Tests: Recent Labs  Lab 02/19/17 1539 02/20/17 0619  AST 24 22  ALT 14 15  ALKPHOS 113 88  BILITOT 0.4 0.4  PROT 7.4 6.1*  ALBUMIN 3.3* 2.6*   CBC: Recent Labs  Lab 02/19/17 1539 02/20/17 0619  WBC 8.8 5.5  NEUTROABS 4.9  --   HGB 12.1 11.2*  HCT 40.2 37.2  MCV 94.1 94.7  PLT 219 184   Cardiac Enzymes: Recent Labs  Lab 02/19/17 1539 02/20/17 0042 02/20/17 0619 02/20/17 1234  TROPONINI <0.03 <0.03 <0.03 <0.03   BNP (last 3 results) Recent Labs    02/19/17 1540  BNP 121.0*  Recent Results (from the past 240 hour(s))  Blood Culture (routine x 2)     Status: None (Preliminary result)   Collection Time: 02/19/17  3:39 PM  Result Value Ref Range Status   Specimen Description LEFT ANTECUBITAL  Final   Special Requests   Final    BOTTLES DRAWN AEROBIC AND ANAEROBIC Blood Culture adequate volume   Culture   Final    NO GROWTH < 24 HOURS Performed at [facility], [address] [place], [place] [address]    Report Status PENDING  Incomplete  Blood Culture  (routine x 2)     Status: None (Preliminary result)   Collection Time: 02/19/17  3:44 PM  Result Value Ref Range Status   Specimen Description BLOOD RIGHT HAND  Final   Special Requests   Final    BOTTLES DRAWN AEROBIC AND ANAEROBIC Blood Culture adequate volume   Culture   Final    NO GROWTH < 24 HOURS Performed at [facility], [address] [place], [place] [address]    Report Status PENDING  Incomplete  MRSA PCR Screening     Status: None   Collection Time: 02/20/17 12:01 AM  Result Value Ref Range Status   MRSA by PCR NEGATIVE NEGATIVE Final    Comment:        The GeneXpert MRSA Assay (FDA approved for NASAL specimens only), is one component of a comprehensive MRSA colonization surveillance program. It is not intended to diagnose MRSA infection nor to guide or monitor treatment for MRSA infections. Performed at [facility], [address], [place], [place] [address]      Studies: Ct Angio Chest Pe W Or Wo Contrast  Result Date: 02/20/2017 CLINICAL DATA:  Acute onset of dyspnea. EXAM: CT ANGIOGRAPHY CHEST WITH CONTRAST TECHNIQUE: Multidetector CT imaging of the chest was performed using the standard protocol during bolus administration of intravenous contrast. Multiplanar CT image reconstructions and MIPs were obtained to evaluate the vascular anatomy. CONTRAST:  127m ISOVUE-370 IOPAMIDOL (ISOVUE-370) INJECTION 76% COMPARISON:  CTA of the chest performed 08/25/2013, and chest radiograph performed 02/19/2017 FINDINGS: Cardiovascular:  There is no evidence of pulmonary embolus. The heart is normal in size. Scattered calcification is noted along the thoracic aorta and proximal great vessels. Mediastinum/Nodes: The patient's endotracheal tube is seen ending 2-3 cm above the carina. No definite mediastinal lymphadenopathy is seen. No pericardial effusion is identified. The visualized portions of the thyroid gland are diminutive and grossly unremarkable. No axillary lymphadenopathy is  seen. Lungs/Pleura: Bilateral emphysema is noted, particularly at the upper lung lobes. Trace left-sided pleural fluid is noted. Bibasilar atelectasis or scarring is seen. There is no evidence of pneumothorax. No mass is identified. Upper Abdomen: The visualized portions of the liver and spleen are unremarkable. Musculoskeletal: No acute osseous abnormalities are identified. The visualized musculature is unremarkable in appearance. Review of the MIP images confirms the above findings. IMPRESSION: 1. No evidence of pulmonary embolus. 2. Bilateral emphysema, particularly at the upper lung lobes. Trace left-sided pleural fluid. Bibasilar atelectasis or scarring. Electronically Signed   By: JGarald BaldingM.D.   On: 02/20/2017 05:39   Dg Chest Port 1 View  Result Date: 02/19/2017 CLINICAL DATA:  67year old female with cough fever and shortness of breath for 3 days. Bilateral lower extremity edema. EXAM: PORTABLE CHEST 1 VIEW COMPARISON:  08/27/2013 chest radiographs and earlier. FINDINGS: Portable AP upright view at 1559 hrs. Mediastinal contours remain normal. Visualized tracheal air column is within normal limits. Stable lung volumes.  Chronic increased pulmonary interstitial opacity, but there is increased pulmonary interstitium or vascularity compared to prior studies. Chronic blunting of the both costophrenic angles. No pneumothorax. No pleural effusion suspected. No confluent pulmonary opacity. IMPRESSION: Acute on chronic pulmonary interstitial opacity. Top differential considerations are viral/atypical respiratory infection and interstitial edema. No definite pleural effusion or other acute cardiopulmonary abnormality. Electronically Signed   By: Genevie Ann M.D.   On: 02/19/2017 16:07   Dg Chest Port 1v Same Day  Result Date: 02/19/2017 CLINICAL DATA:  Endotracheal tube placement. Respiratory distress, acute onset. EXAM: PORTABLE CHEST 1 VIEW COMPARISON:  Chest radiograph performed earlier today at 3:59  p.m. FINDINGS: The patient's endotracheal tube is seen ending 5 cm above the carina. An enteric tube is noted extending below the diaphragm. Mild vascular congestion is noted. Previously noted increased interstitial markings are improved. A small right pleural effusion is noted. No pneumothorax is seen. The cardiomediastinal silhouette is normal in size. No acute osseous abnormalities are identified. IMPRESSION: 1. Endotracheal tube seen ending 5 cm above the carina. 2. Mild vascular congestion. Interval improvement in increased interstitial markings. Small right pleural effusion noted. Electronically Signed   By: Garald Balding M.D.   On: 02/19/2017 22:57    Scheduled Meds: . chlorhexidine gluconate (MEDLINE KIT)  15 mL Mouth Rinse BID  . enoxaparin (LOVENOX) injection  80 mg Subcutaneous Q12H  . ipratropium-albuterol  3 mL Nebulization Q6H  . levothyroxine  62.5 mcg Intravenous Daily  . mouth rinse  15 mL Mouth Rinse QID  . methylPREDNISolone (SOLU-MEDROL) injection  80 mg Intravenous Q8H  . oseltamivir  75 mg Oral BID   Continuous Infusions: . sodium chloride    . sodium chloride    . azithromycin Stopped (02/20/17 0320)  . famotidine (PEPCID) IV Stopped (02/20/17 1024)  . fentaNYL infusion INTRAVENOUS 225 mcg/hr (02/20/17 1340)  . piperacillin-tazobactam (ZOSYN)  IV 3.375 g (02/20/17 1513)  . vancomycin      Principal Problem:   COPD exacerbation ([place]) Active Problems:   Tachycardia   Influenza A   Acute respiratory failure ([place])    Time spent: 35 minutes    [facility] Pager [phone]. If 7PM-7AM, please contact night-coverage at [email or web address], password [facility] 02/20/2017, 4:50 PM  LOS: 1 day

## 2017-02-20 NOTE — Progress Notes (Signed)
Initial Nutrition Assessment  DOCUMENTATION CODES:  Not applicable  INTERVENTION:  If pt unable to be extubated in next 24-36 hrs, would recommend beginning nutrition support.   Appropriate recommendations are as follows: Vital AF 1.2 @ 50cc/hr  to provide 1440 kcals, 90 gm protein, 973 ml free water daily.  NUTRITION DIAGNOSIS:  Inadequate oral intake related to inability to eat as evidenced by NPO status.  GOAL:  Patient will meet greater than or equal to 90% of their needs  MONITOR:  Diet advancement, Vent status, Labs, Weight trends  REASON FOR ASSESSMENT:  Ventilator    ASSESSMENT:  67 y/o female PMHx tobacco abuse (50 pack year), HTN/HLD, Noncompliance, FactorV Leiden def, COPD (o2 dependent), MI, CAD. Presented with SOB, coughing, fever for a few days.  Found to be influenza A positive. Intubated 2/11 due to worsening respiratory distress.   Pt intubated, sedated. No family, friends or other historians present. Unable to obtain any untake hx.   Per chart, pt admitted at 173 lbs. Recent UBW appears to have been 165-170 lbs. She appears to  have lost 10-15 lbs in about 1 year, 20-25 lbs x 3 years   Patient is currently intubated on ventilator support MV: 7.7 L/min Temp (24hrs), Avg:97 F (36.1 C), Min:96.3 F (35.7 C), Max:103 F (39.4 C) Propofol: None, sedated on fentanyl  Her initial temp was 39.4 degrees C, however, every other reading has been <36.6. Will use 36.6 for kcal/pro dosing purposes, since patient actually has been more hypothermic today. Will substantially lower kcal needs; approximately 470 less  Labs: Albumin: 2.6, Glu:110-130, Meds: Methylprednisolone, Tamiflu, Fentanyl, IV abx, IVF  Recent Labs  Lab 02/19/17 1539 02/20/17 0619  NA 140 138  K 3.8 4.3  CL 98* 102  CO2 30 28  BUN 15 17  CREATININE 0.94 0.70  CALCIUM 8.7* 8.0*  GLUCOSE 114* 128*   NUTRITION - FOCUSED PHYSICAL EXAM: WDL- mild edema  Diet Order:  Diet NPO time  specified  EDUCATION NEEDS:  No education needs have been identified at this time   Skin:  Skin Assessment: Reviewed RN Assessment  Last BM:  Unknown  Height:  Ht Readings from Last 1 Encounters:  02/20/17 5\' 5"  (1.651 m)   Weight:  Wt Readings from Last 1 Encounters:  02/20/17 175 lb 14.8 oz (79.8 kg)   Wt Readings from Last 10 Encounters:  02/20/17 175 lb 14.8 oz (79.8 kg)  01/24/17 167 lb 12.8 oz (76.1 kg)  09/21/16 170 lb 12.8 oz (77.5 kg)  09/06/16 174 lb (78.9 kg)  03/14/16 181 lb 8 oz (82.3 kg)  12/06/15 187 lb (84.8 kg)  06/01/15 195 lb (88.5 kg)  05/14/15 195 lb (88.5 kg)  11/26/14 161 lb (73 kg)  10/28/14 195 lb (88.5 kg)   Ideal Body Weight:  56.82 kg  BMI:  Body mass index using admit weight is 28.9 kg/m.  Estimated Nutritional Needs:  Kcal:  1415 kcals ([facility] 2003 b) Protein:  91-102g Pro (1.6-1.8 g/kg ibw) Fluid:  Per MDs goals  Burtis Junes RD, LDN, CNSC Clinical Nutrition Pager: [phone] 02/20/2017 2:30 PM

## 2017-02-20 NOTE — Progress Notes (Signed)
Pharmacy Note:  Initial antibiotic(s) regimen of Vancomycin and zosyn ordered by EDP to treat sepsis.  Estimated Creatinine Clearance: 72.2 mL/min (by C-G formula based on SCr of 0.7 mg/dL).   Allergies  Allergen Reactions  . Dilaudid [Hydromorphone Hcl] Hives and Nausea Only  . Minocycline Hcl     REACTION: Dizzy  . Prednisone     REACTION: feels like throat swelling  . Varenicline Tartrate     REACTION: Dizzy(chantix)   . Zocor [Simvastatin - High Dose] Other (See Comments)    myalgia    Vitals:   02/20/17 0749 02/20/17 0828  BP:    Pulse: 65   Resp: 11   Temp: (!) 96.6 F (35.9 C)   SpO2: 97% 97%    Anti-infectives (From admission, onward)   Start     Dose/Rate Route Frequency Ordered Stop   02/20/17 1800  vancomycin (VANCOCIN) IVPB 1000 mg/200 mL premix     1,000 mg 200 mL/hr over 60 Minutes Intravenous Every 12 hours 02/20/17 0828     02/20/17 0600  vancomycin (VANCOCIN) IVPB 750 mg/150 ml premix  Status:  Discontinued     750 mg 150 mL/hr over 60 Minutes Intravenous Every 12 hours 02/19/17 1851 02/20/17 0828   02/20/17 0100  azithromycin (ZITHROMAX) 500 mg in sodium chloride 0.9 % 250 mL IVPB     500 mg 250 mL/hr over 60 Minutes Intravenous Every 24 hours 02/20/17 0013     02/20/17 0000  piperacillin-tazobactam (ZOSYN) IVPB 3.375 g     3.375 g 12.5 mL/hr over 240 Minutes Intravenous Every 8 hours 02/19/17 1851     02/19/17 2000  oseltamivir (TAMIFLU) capsule 75 mg     75 mg Oral 2 times daily 02/19/17 1945 02/24/17 2159   02/19/17 1600  vancomycin (VANCOCIN) 1,500 mg in sodium chloride 0.9 % 500 mL IVPB     1,500 mg 250 mL/hr over 120 Minutes Intravenous  Once 02/19/17 1556 02/19/17 1821   02/19/17 1545  piperacillin-tazobactam (ZOSYN) IVPB 3.375 g     3.375 g 100 mL/hr over 30 Minutes Intravenous  Once 02/19/17 1540 02/19/17 1633   02/19/17 1545  vancomycin (VANCOCIN) IVPB 1000 mg/200 mL premix  Status:  Discontinued     1,000 mg 200 mL/hr over 60 Minutes  Intravenous  Once 02/19/17 1540 02/19/17 1556     Antimicrobials this admission:  Zosyn 2/11 >>  Vanc 2/11 >>  Azith 2/11 >>  Dose adjustments this admission:  n/a   Microbiology results:  2/11 BCx: pending  UCx:    Sputum:    MRSA PCR:    Plan: Vanc 1000mg  IV every 12 hours. Zosyn 3.375gm IV every 8 hours. Follow-up micro data, labs, vitals.   Ena Dawley, [facility] 02/20/2017 8:28 AM

## 2017-02-20 NOTE — Consult Note (Signed)
Consult requested by: [facility] hospitalist, Dr. Dyann Kief Consult requested for: Respiratory failure  HPI: This is a 67 year old who came to the emergency department after increasing shortness of breath.  This is been going on for about 24 hours.  She had been coughing with yellow sputum.  She denied fever chills chest pain palpitations nausea vomiting diarrhea blood per rectum dark stool.  She was found to be positive for influenza in the emergency department.  She has hypercoagulability and had elevated d-dimer so she underwent CT angiogram of the chest and I have personally reviewed that study.  This shows emphysema no clot and no infiltrate.  Some questionable atelectasis.  She was found to have a PCO2 of greater than 100 and she was placed on mechanical ventilation and intubated.  History is from the medical record as she is intubated and on a ventilator with no family present  Past Medical History:  Diagnosis Date  . Acute MI ([place])    x4, code blue x3  . Arteriosclerotic cardiovascular disease (ASCVD) 2002   Inf STEMI-2002. 2003-cutting balloon + brachytherapy for restenosis; subsequent acute stent thrombosis 06/2010 requiring 2 separate interventions (Zeta stent, then repeat cath with thrombectomy). focal basal inf AK, nl EF; 03/2011: Patent stents, minor nonobst  residual dz, nl EF; neg stress nuclear in 2008 and stress echo in 2009  . Chronic anticoagulation    Warfarin plus ticagrelor  . Chronic respiratory failure ([place]) 08/27/2013   On 2L 02  . 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 Factor V Leiden, prothrombin gene mutation ([place]) 2006  . Hyperlipidemia   . Hypothyroidism   . Noncompliance   . Pelvic fracture ([place]) 2009  . Pulmonary embolism ([place]) 2006   Associated with deep vein thrombosis-2006; + factor V Leiden  . Tobacco abuse    50 pack years     Family History  Problem Relation Age of Onset  . Emphysema Mother   . Alzheimer's disease Mother   .  Emphysema Sister   . Heart disease Father   . Factor V Leiden deficiency Father   . Factor V Leiden deficiency Sister   . Factor V Leiden deficiency Daughter   . Kidney cancer Paternal Grandmother      Social History   Socioeconomic History  . Marital status: Single    Spouse name: None  . Number of children: None  . Years of education: None  . Highest education level: None  Social Needs  . Financial resource strain: None  . Food insecurity - worry: None  . Food insecurity - inability: None  . Transportation needs - medical: None  . Transportation needs - non-medical: None  Occupational History  . Occupation: Disabled  . Occupation: Truck Geophysicist/field seismologist  Tobacco Use  . Smoking status: Current Some Day Smoker    Packs/day: 0.50    Years: 50.00    Pack years: 25.00    Types: Cigarettes    Start date: 05/10/1968  . Smokeless tobacco: Never Used  Substance and Sexual Activity  . Alcohol use: No    Alcohol/week: 0.0 oz  . Drug use: No  . Sexual activity: None  Other Topics Concern  . None  Social History Narrative   Originally from [place]. Previously has lived in [place], [place], [place] & moved back to [place] in 1994. 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 Ronie Spies was in  her current home. She also had an owl living in her house as a rehab pet. She also had chickens until Fall 2015. No mold exposure.      ROS: Unobtainable    Objective: Vital signs in last 24 hours: Temp:  [96.6 F (35.9 C)-103 F (39.4 C)] 96.6 F (35.9 C) (02/12 0749) Pulse Rate:  [65-140] 65 (02/12 0749) Resp:  [0-34] 11 (02/12 0749) BP: (93-167)/(38-145) 102/57 (02/12 0445) SpO2:  [91 %-100 %] 97 % (02/12 0828) FiO2 (%):  [40 %-50 %] 40 % (02/12 0828) Weight:  [74.8 kg (165 lb)-79.8 kg (175 lb 14.8 oz)] 79.8 kg (175 lb 14.8 oz) (02/12 0500) Weight change:     Intake/Output from previous day: 02/11 0701 - 02/12 0700 In: 2750 [IV  Piggyback:2750] Out: -   PHYSICAL EXAM Constitutional: She is intubated sedated on mechanical ventilation.  Eyes: EOMI.  Ears nose mouth and throat: She has endotracheal and orogastric tube in her throat.  Cardiovascular: Her heart is regular with normal heart sounds.  Respiratory: She has end expiratory wheezes and some crackles with inspiration.  Gastrointestinal: Her abdomen is soft with no masses.  Skin: Warm and dry.  Musculoskeletal: Cannot assess.  Neurological: Cannot assess.  Psychiatric: Cannot assess  Lab Results: Basic Metabolic Panel: Recent Labs    02/19/17 1539 02/20/17 0619  NA 140 138  K 3.8 4.3  CL 98* 102  CO2 30 28  GLUCOSE 114* 128*  BUN 15 17  CREATININE 0.94 0.70  CALCIUM 8.7* 8.0*   Liver Function Tests: Recent Labs    02/19/17 1539 02/20/17 0619  AST 24 22  ALT 14 15  ALKPHOS 113 88  BILITOT 0.4 0.4  PROT 7.4 6.1*  ALBUMIN 3.3* 2.6*   No results for input(s): LIPASE, AMYLASE in the last 72 hours. No results for input(s): AMMONIA in the last 72 hours. CBC: Recent Labs    02/19/17 1539 02/20/17 0619  WBC 8.8 5.5  NEUTROABS 4.9  --   HGB 12.1 11.2*  HCT 40.2 37.2  MCV 94.1 94.7  PLT 219 184   Cardiac Enzymes: Recent Labs    02/19/17 1539 02/20/17 0042 02/20/17 0619  TROPONINI <0.03 <0.03 <0.03   BNP: No results for input(s): PROBNP in the last 72 hours. D-Dimer: Recent Labs    02/20/17 0042  DDIMER 1.94*   CBG: No results for input(s): GLUCAP in the last 72 hours. Hemoglobin A1C: No results for input(s): HGBA1C in the last 72 hours. Fasting Lipid Panel: No results for input(s): CHOL, HDL, LDLCALC, TRIG, CHOLHDL, LDLDIRECT in the last 72 hours. Thyroid Function Tests: No results for input(s): TSH, T4TOTAL, FREET4, T3FREE, THYROIDAB in the last 72 hours. Anemia Panel: No results for input(s): VITAMINB12, FOLATE, FERRITIN, TIBC, IRON, RETICCTPCT in the last 72 hours. Coagulation: Recent Labs    02/19/17 1539 02/20/17 0619   LABPROT 18.7* 19.0*  INR 1.58 1.61   Urine Drug Screen: Drugs of Abuse  No results found for: LABOPIA, COCAINSCRNUR, LABBENZ, AMPHETMU, THCU, LABBARB  Alcohol Level: No results for input(s): ETH in the last 72 hours. Urinalysis: Recent Labs    02/20/17 0000  COLORURINE YELLOW  LABSPEC >1.030*  PHURINE 6.0  GLUCOSEU NEGATIVE  HGBUR LARGE*  BILIRUBINUR NEGATIVE  KETONESUR NEGATIVE  PROTEINUR 100*  NITRITE NEGATIVE  LEUKOCYTESUR NEGATIVE   Misc. Labs:   ABGS: Recent Labs    02/20/17 0020  PHART 7.201*  PO2ART 98.3  HCO3 22.6     MICROBIOLOGY: Recent Results (from the  past 240 hour(s))  Blood Culture (routine x 2)     Status: None (Preliminary result)   Collection Time: 02/19/17  3:39 PM  Result Value Ref Range Status   Specimen Description LEFT ANTECUBITAL  Final   Special Requests   Final    BOTTLES DRAWN AEROBIC AND ANAEROBIC Blood Culture adequate volume   Culture   Final    NO GROWTH < 24 HOURS Performed at [facility], [address], [place], [place] [address]    Report Status PENDING  Incomplete  Blood Culture (routine x 2)     Status: None (Preliminary result)   Collection Time: 02/19/17  3:44 PM  Result Value Ref Range Status   Specimen Description BLOOD RIGHT HAND  Final   Special Requests   Final    BOTTLES DRAWN AEROBIC AND ANAEROBIC Blood Culture adequate volume   Culture   Final    NO GROWTH < 24 HOURS Performed at [facility], [address] [place], [place] [address]    Report Status PENDING  Incomplete  MRSA PCR Screening     Status: None   Collection Time: 02/20/17 12:01 AM  Result Value Ref Range Status   MRSA by PCR NEGATIVE NEGATIVE Final    Comment:        The GeneXpert MRSA Assay (FDA approved for NASAL specimens only), is one component of a comprehensive MRSA colonization surveillance program. It is not intended to diagnose MRSA infection nor to guide or monitor treatment for MRSA infections. Performed at [facility], [address], [place], [place] [address]     Studies/Results: Ct Angio Chest Pe W Or Wo Contrast  Result Date: 02/20/2017 CLINICAL DATA:  Acute onset of dyspnea. EXAM: CT ANGIOGRAPHY CHEST WITH CONTRAST TECHNIQUE: Multidetector CT imaging of the chest was performed using the standard protocol during bolus administration of intravenous contrast. Multiplanar CT image reconstructions and MIPs were obtained to evaluate the vascular anatomy. CONTRAST:  121mL ISOVUE-370 IOPAMIDOL (ISOVUE-370) INJECTION 76% COMPARISON:  CTA of the chest performed 08/25/2013, and chest radiograph performed 02/19/2017 FINDINGS: Cardiovascular:  There is no evidence of pulmonary embolus. The heart is normal in size. Scattered calcification is noted along the thoracic aorta and proximal great vessels. Mediastinum/Nodes: The patient's endotracheal tube is seen ending 2-3 cm above the carina. No definite mediastinal lymphadenopathy is seen. No pericardial effusion is identified. The visualized portions of the thyroid gland are diminutive and grossly unremarkable. No axillary lymphadenopathy is seen. Lungs/Pleura: Bilateral emphysema is noted, particularly at the upper lung lobes. Trace left-sided pleural fluid is noted. Bibasilar atelectasis or scarring is seen. There is no evidence of pneumothorax. No mass is identified. Upper Abdomen: The visualized portions of the liver and spleen are unremarkable. Musculoskeletal: No acute osseous abnormalities are identified. The visualized musculature is unremarkable in appearance. Review of the MIP images confirms the above findings. IMPRESSION: 1. No evidence of pulmonary embolus. 2. Bilateral emphysema, particularly at the upper lung lobes. Trace left-sided pleural fluid. Bibasilar atelectasis or scarring. Electronically Signed   By: Garald Balding M.D.   On: 02/20/2017 05:39   Dg Chest Port 1 View  Result Date: 02/19/2017 CLINICAL DATA:  67 year old female with cough fever and shortness  of breath for 3 days. Bilateral lower extremity edema. EXAM: PORTABLE CHEST 1 VIEW COMPARISON:  08/27/2013 chest radiographs and earlier. FINDINGS: Portable AP upright view at 1559 hrs. Mediastinal contours remain normal. Visualized tracheal air column is within normal limits. Stable lung volumes. Chronic increased pulmonary interstitial opacity,  but there is increased pulmonary interstitium or vascularity compared to prior studies. Chronic blunting of the both costophrenic angles. No pneumothorax. No pleural effusion suspected. No confluent pulmonary opacity. IMPRESSION: Acute on chronic pulmonary interstitial opacity. Top differential considerations are viral/atypical respiratory infection and interstitial edema. No definite pleural effusion or other acute cardiopulmonary abnormality. Electronically Signed   By: Genevie Ann M.D.   On: 02/19/2017 16:07   Dg Chest Port 1v Same Day  Result Date: 02/19/2017 CLINICAL DATA:  Endotracheal tube placement. Respiratory distress, acute onset. EXAM: PORTABLE CHEST 1 VIEW COMPARISON:  Chest radiograph performed earlier today at 3:59 p.m. FINDINGS: The patient's endotracheal tube is seen ending 5 cm above the carina. An enteric tube is noted extending below the diaphragm. Mild vascular congestion is noted. Previously noted increased interstitial markings are improved. A small right pleural effusion is noted. No pneumothorax is seen. The cardiomediastinal silhouette is normal in size. No acute osseous abnormalities are identified. IMPRESSION: 1. Endotracheal tube seen ending 5 cm above the carina. 2. Mild vascular congestion. Interval improvement in increased interstitial markings. Small right pleural effusion noted. Electronically Signed   By: Garald Balding M.D.   On: 02/19/2017 22:57    Medications:  Prior to Admission:  Medications Prior to Admission  Medication Sig Dispense Refill Last Dose  . albuterol (PROVENTIL) (2.5 MG/3ML) 0.083% nebulizer solution USE 1 VIAL BY  NEBULIZER EVERY 4 HOURS AS NEEDED FOR WHEEZING. DX: J44.9 375 vial 0   . budesonide (PULMICORT) 0.5 MG/2ML nebulizer solution Take 2 mLs (0.5 mg total) by nebulization 2 (two) times daily. Dx: J43.9 120 mL 3   . fentaNYL (DURAGESIC - DOSED MCG/HR) 50 MCG/HR Place 50 mcg onto the skin every 3 (three) days.   Taking  . fish oil-omega-3 fatty acids 1000 MG capsule Take 2 g by mouth daily.     Taking  . ipratropium-albuterol (DUONEB) 0.5-2.5 (3) MG/3ML SOLN 1 vial via nebulizer at breakfast, lunch, dinner and bedtime daily. May take 2 additional treatments on bad day Dx: J43.9 360 mL 3   . levothyroxine (SYNTHROID, LEVOTHROID) 125 MCG tablet take 1 tablet by mouth once daily 90 tablet 2 Taking  . Magnesium 500 MG CAPS Take 500 mg by mouth.   Taking  . nitroGLYCERIN (NITROSTAT) 0.4 MG SL tablet Place 1 tablet (0.4 mg total) under the tongue every 5 (five) minutes as needed for chest pain. 25 tablet 3 Taking  . Respiratory Therapy Supplies (FLUTTER) DEVI Use as directed 1 each 0 Taking  . ticagrelor (BRILINTA) 60 MG TABS tablet Take 1 tablet (60 mg total) by mouth 2 (two) times daily. (Patient not taking: Reported on 01/24/2017) 60 tablet 11 Not Taking  . warfarin (COUMADIN) 1 MG tablet Take 1 tablet (1mg ) on Tuesdays and Saturdays along with a 5mg  tablet 30 tablet 4 Taking  . warfarin (COUMADIN) 5 MG tablet Take 1 tablet (5 mg total) by mouth daily. 90 tablet 3    Scheduled: . fentaNYL  50 mcg Transdermal Q72H  . ipratropium-albuterol  3 mL Nebulization Q6H  . levothyroxine  62.5 mcg Intravenous Daily  . methylPREDNISolone (SOLU-MEDROL) injection  80 mg Intravenous Q8H  . oseltamivir  75 mg Oral BID  . warfarin  7.5 mg Oral Once  . Warfarin - Pharmacist Dosing Inpatient   Does not apply Q24H   Continuous: . sodium chloride 75 mL/hr at 02/20/17 0138  . sodium chloride    . azithromycin Stopped (02/20/17 0320)  . famotidine (PEPCID) IV Stopped (02/20/17  0210)  . fentaNYL infusion INTRAVENOUS 225  mcg/hr (02/20/17 0449)  . piperacillin-tazobactam (ZOSYN)  IV Stopped (02/20/17 0500)  . vancomycin     TJQ:ZESPQZ chloride, albuterol, midazolam  Assesment: She was admitted with COPD exacerbation and acute on chronic hypoxic and hypercapnic respiratory failure.  Her PCO2 went over 100 which culminated in her being intubated and placed on mechanical ventilation.  She does not have pneumonia on chest x-ray.  I have personally reviewed that and her angiogram.  She does have influenza which is being treated.  She is critically ill and high risk for severe complications considering the COPD and Principal Problem:   COPD exacerbation ([place]) Active Problems:   Tachycardia   Influenza A   Acute respiratory failure ([place])    Plan: Continue treatments.  Agree with broad-spectrum antibiotics.  Agree with nebulizer treatments and steroids.  She is not ready for any sort of attempt at weaning yet    LOS: 1 day   Zayveon Raschke L 02/20/2017, 8:29 AM

## 2017-02-21 ENCOUNTER — Inpatient Hospital Stay (HOSPITAL_COMMUNITY): Payer: Medicare Other

## 2017-02-21 DIAGNOSIS — J441 Chronic obstructive pulmonary disease with (acute) exacerbation: Secondary | ICD-10-CM

## 2017-02-21 DIAGNOSIS — I340 Nonrheumatic mitral (valve) insufficiency: Secondary | ICD-10-CM

## 2017-02-21 DIAGNOSIS — J9600 Acute respiratory failure, unspecified whether with hypoxia or hypercapnia: Secondary | ICD-10-CM

## 2017-02-21 DIAGNOSIS — J101 Influenza due to other identified influenza virus with other respiratory manifestations: Secondary | ICD-10-CM

## 2017-02-21 DIAGNOSIS — R000 Tachycardia, unspecified: Secondary | ICD-10-CM

## 2017-02-21 LAB — ECHOCARDIOGRAM COMPLETE
E/e' ratio: 19.4
EWDT: 254 ms
FS: 31 % (ref 28–44)
HEIGHTINCHES: 65 in
IVS/LV PW RATIO, ED: 1.02
LA diam index: 1.44 cm/m2
LA vol A4C: 24.6 ml
LA vol index: 17.9 mL/m2
LA vol: 33.7 mL
LASIZE: 27 mm
LEFT ATRIUM END SYS DIAM: 27 mm
LV E/e' medial: 19.4
LV E/e'average: 19.4
LV e' LATERAL: 5 cm/s
Lateral S' vel: 8.92 cm/s
MV Dec: 254
MV pk A vel: 73.7 m/s
MV pk E vel: 97 m/s
MVPG: 4 mmHg
PW: 12.7 mm — AB (ref 0.6–1.1)
TAPSE: 19.8 mm
TDI e' lateral: 5
TDI e' medial: 4.68
WEIGHTICAEL: 2839.52 [oz_av]

## 2017-02-21 LAB — BLOOD GAS, ARTERIAL
Acid-Base Excess: 1.1 mmol/L (ref 0.0–2.0)
Acid-Base Excess: 1.5 mmol/L (ref 0.0–2.0)
BICARBONATE: 23.4 mmol/L (ref 20.0–28.0)
Bicarbonate: 24.8 mmol/L (ref 20.0–28.0)
DRAWN BY: 317771
DRAWN BY: 23534
FIO2: 40
FIO2: 0.4
LHR: 16 {breaths}/min
MECHVT: 500 mL
O2 SAT: 98.2 %
O2 SAT: 95.2 %
PCO2 ART: 64.7 mmHg — AB (ref 32.0–48.0)
PCO2 ART: 57.3 mmHg — AB (ref 32.0–48.0)
PEEP/CPAP: 5 cmH2O
PEEP: 5 cmH2O
PO2 ART: 148 mmHg — AB (ref 83.0–108.0)
PO2 ART: 80.7 mmHg — AB (ref 83.0–108.0)
RATE: 20 resp/min
VT: 500 mL
pH, Arterial: 7.253 — ABNORMAL LOW (ref 7.350–7.450)
pH, Arterial: 7.299 — ABNORMAL LOW (ref 7.350–7.450)

## 2017-02-21 LAB — GLUCOSE, CAPILLARY
GLUCOSE-CAPILLARY: 110 mg/dL — AB (ref 65–99)
GLUCOSE-CAPILLARY: 118 mg/dL — AB (ref 65–99)
GLUCOSE-CAPILLARY: 121 mg/dL — AB (ref 65–99)
Glucose-Capillary: 115 mg/dL — ABNORMAL HIGH (ref 65–99)

## 2017-02-21 LAB — BASIC METABOLIC PANEL
Anion gap: 7 (ref 5–15)
BUN: 27 mg/dL — AB (ref 6–20)
CHLORIDE: 105 mmol/L (ref 101–111)
CO2: 27 mmol/L (ref 22–32)
CREATININE: 0.89 mg/dL (ref 0.44–1.00)
Calcium: 8.5 mg/dL — ABNORMAL LOW (ref 8.9–10.3)
GFR calc Af Amer: >60 mL/min (ref >60)
GFR calc non Af Amer: >60 mL/min (ref >60)
Glucose, Bld: 153 mg/dL — ABNORMAL HIGH (ref 65–99)
POTASSIUM: 4.2 mmol/L (ref 3.5–5.1)
SODIUM: 139 mmol/L (ref 135–145)

## 2017-02-21 LAB — CBC
HCT: 37.2 % (ref 36.0–46.0)
Hemoglobin: 11.2 g/dL — ABNORMAL LOW (ref 12.0–15.0)
MCH: 28.6 pg (ref 26.0–34.0)
MCHC: 30.1 g/dL (ref 30.0–36.0)
MCV: 94.9 fL (ref 78.0–100.0)
Platelets: 204 10*3/uL (ref 150–400)
RBC: 3.92 MIL/uL (ref 3.87–5.11)
RDW: 16.5 % — AB (ref 11.5–15.5)
WBC: 7.1 10*3/uL (ref 4.0–10.5)

## 2017-02-21 MED ORDER — FENTANYL CITRATE (PF) 2500 MCG/50ML IJ SOLN
INTRAMUSCULAR | Status: AC
  Filled 2017-02-21: qty 50

## 2017-02-21 MED ORDER — VITAL HIGH PROTEIN PO LIQD
1000.0000 mL | ORAL | Status: DC
  Administered 2017-02-21: 1000 mL
  Filled 2017-02-21 (×2): qty 1000

## 2017-02-21 MED ORDER — ORAL CARE MOUTH RINSE
15.0000 mL | OROMUCOSAL | Status: DC
  Administered 2017-02-21 – 2017-02-27 (×50): 15 mL via OROMUCOSAL

## 2017-02-21 MED ORDER — VITAL AF 1.2 CAL PO LIQD
1000.0000 mL | ORAL | Status: DC
  Administered 2017-02-21 – 2017-02-25 (×7): 1000 mL
  Filled 2017-02-21 (×10): qty 1000

## 2017-02-21 MED ORDER — CHLORHEXIDINE GLUCONATE 0.12% ORAL RINSE (MEDLINE KIT)
15.0000 mL | Freq: Two times a day (BID) | OROMUCOSAL | Status: DC
  Administered 2017-02-21 – 2017-02-26 (×11): 15 mL via OROMUCOSAL

## 2017-02-21 MED ORDER — INSULIN ASPART 100 UNIT/ML ~~LOC~~ SOLN
3.0000 [IU] | SUBCUTANEOUS | Status: DC
  Administered 2017-02-21 – 2017-02-26 (×33): 3 [IU] via SUBCUTANEOUS

## 2017-02-21 NOTE — Progress Notes (Signed)
[facility] PROGRESS NOTE  [name] MBW:[id number] DOB: 1950-01-21 DOA: 02/19/2017 PCP: [name], NP  Interim summary and HPI [name]  is a 67 y.o. female, w hx of CAD, Hypothyroidism, PE, Factor V leiden, Copd on home o2, apparently c/o increase in dyspnea for 24 hours prior to admission.  Slight cough, dry-yellow sputum.  Denies fever, chills, cp, palp, n/v, diarrhea, brbpr, black stool.  Pt presents to ED due to increase in dyspnea.    CXR demonstrated acute on chronic pulmonary interstitial opacity; CTA neg for PE. Positive influenza A  Assessment/Plan: 1-acute on chronic resp failure with hypoxia and hypercapnia: in the setting of COPD exacerbation and influenza A infection . -patient initially treated with BIPAP, with very little improvement -COs increased over 100 and required ventilatory support -will follow rec's from pulmonary/critical care -antibiotics broaden  -continue steroids, nebulizer treatment and pulmicort  -follow ABG and CXR to assess/guide weaning process, pulmonary made some adjustments to vent rate today  -continue Tamiflu -start tube feeding  2-tachycardia -in presentation of acute infection, albuterol treatment and anxiety -CTA neg for PE -TSH WNL -echo pending  3-hypothyroidism -continue synthroid  -half dose while given IV  4-GI prophylaxis -continue famotidine BID  5-Factor V mutation and hx of PE -while intubated will use lovenox per pharmacy -resume coumadin once stable and able to tolerate PO's   Code Status: Full Family Communication: no family at bedside  Disposition Plan: remains in ICU, continue ventilatory support, continue IV antibiotics, steroids and nebulizer.   Consultants:  Pulmonary/critical care service  Procedures:  See below for x-ray reports  Ventilator dependent   2-D echo:    Antibiotics:  Vancomycin, zosyn and zithromax  2/11  HPI/Subjective: Sedated and mechanically  ventilated;  Objective: Vitals:   02/21/17 0600 02/21/17 0700  BP: (!) 100/53 (!) 97/56  Pulse: 70 60  Resp: 16 14  Temp: (!) 97.3 F (36.3 C) (!) 97.2 F (36.2 C)  SpO2: 98% 98%    Intake/Output Summary (Last 24 hours) at 02/21/2017 0816 Last data filed at 02/21/2017 0500 Gross per 24 hour  Intake 1462.63 ml  Output 1050 ml  Net 412.63 ml   Filed Weights   02/20/17 0400 02/20/17 0500 02/21/17 0500  Weight: 79.8 kg (175 lb 14.8 oz) 79.8 kg (175 lb 14.8 oz) 80.5 kg (177 lb 7.5 oz)    Exam:   General: afebrile, sedated, with ventilatory support; no icterus.  Cardiovascular: S1 and S2, no rubs, no gallops  Respiratory: positive exp wheezing, diminish BS at bases and positive rhonchi.  Abdomen: soft, NT, ND, positive BS  Musculoskeletal: no edema, no cyanosis, no clubbing   Data Reviewed: Basic Metabolic Panel: Recent Labs  Lab 02/19/17 1539 02/20/17 0619 02/21/17 0532  NA 140 138 139  K 3.8 4.3 4.2  CL 98* 102 105  CO2 30 28 27   GLUCOSE 114* 128* 153*  BUN 15 17 27*  CREATININE 0.94 0.70 0.89  CALCIUM 8.7* 8.0* 8.5*   Liver Function Tests: Recent Labs  Lab 02/19/17 1539 02/20/17 0619  AST 24 22  ALT 14 15  ALKPHOS 113 88  BILITOT 0.4 0.4  PROT 7.4 6.1*  ALBUMIN 3.3* 2.6*   CBC: Recent Labs  Lab 02/19/17 1539 02/20/17 0619 02/21/17 0532  WBC 8.8 5.5 7.1  NEUTROABS 4.9  --   --   HGB 12.1 11.2* 11.2*  HCT 40.2 37.2 37.2  MCV 94.1 94.7 94.9  PLT 219 184 204   Cardiac Enzymes: Recent Labs  Lab 02/19/17 1539 02/20/17 0042 02/20/17 0619 02/20/17 1234  TROPONINI <0.03 <0.03 <0.03 <0.03   BNP (last 3 results) Recent Labs    02/19/17 1540  BNP 121.0*    Recent Results (from the past 240 hour(s))  Blood Culture (routine x 2)     Status: None (Preliminary result)   Collection Time: 02/19/17  3:39 PM  Result Value Ref Range Status   Specimen Description LEFT ANTECUBITAL  Final   Special Requests   Final    BOTTLES DRAWN AEROBIC AND  ANAEROBIC Blood Culture adequate volume   Culture   Final    NO GROWTH 2 DAYS Performed at [facility], [address] [place], [place] [address]    Report Status PENDING  Incomplete  Blood Culture (routine x 2)     Status: None (Preliminary result)   Collection Time: 02/19/17  3:44 PM  Result Value Ref Range Status   Specimen Description BLOOD RIGHT HAND  Final   Special Requests   Final    BOTTLES DRAWN AEROBIC AND ANAEROBIC Blood Culture adequate volume   Culture   Final    NO GROWTH 2 DAYS Performed at [facility], [address] [place], [place] [address]    Report Status PENDING  Incomplete  MRSA PCR Screening     Status: None   Collection Time: 02/20/17 12:01 AM  Result Value Ref Range Status   MRSA by PCR NEGATIVE NEGATIVE Final    Comment:        The GeneXpert MRSA Assay (FDA approved for NASAL specimens only), is one component of a comprehensive MRSA colonization surveillance program. It is not intended to diagnose MRSA infection nor to guide or monitor treatment for MRSA infections. Performed at [facility], [address], [place], [place] [address]      Studies: Ct Angio Chest Pe W Or Wo Contrast  Result Date: 02/20/2017 CLINICAL DATA:  Acute onset of dyspnea. EXAM: CT ANGIOGRAPHY CHEST WITH CONTRAST TECHNIQUE: Multidetector CT imaging of the chest was performed using the standard protocol during bolus administration of intravenous contrast. Multiplanar CT image reconstructions and MIPs were obtained to evaluate the vascular anatomy. CONTRAST:  132m ISOVUE-370 IOPAMIDOL (ISOVUE-370) INJECTION 76% COMPARISON:  CTA of the chest performed 08/25/2013, and chest radiograph performed 02/19/2017 FINDINGS: Cardiovascular:  There is no evidence of pulmonary embolus. The heart is normal in size. Scattered calcification is noted along the thoracic aorta and proximal great vessels. Mediastinum/Nodes: The patient's endotracheal tube is seen ending 2-3 cm above the carina. No  definite mediastinal lymphadenopathy is seen. No pericardial effusion is identified. The visualized portions of the thyroid gland are diminutive and grossly unremarkable. No axillary lymphadenopathy is seen. Lungs/Pleura: Bilateral emphysema is noted, particularly at the upper lung lobes. Trace left-sided pleural fluid is noted. Bibasilar atelectasis or scarring is seen. There is no evidence of pneumothorax. No mass is identified. Upper Abdomen: The visualized portions of the liver and spleen are unremarkable. Musculoskeletal: No acute osseous abnormalities are identified. The visualized musculature is unremarkable in appearance. Review of the MIP images confirms the above findings. IMPRESSION: 1. No evidence of pulmonary embolus. 2. Bilateral emphysema, particularly at the upper lung lobes. Trace left-sided pleural fluid. Bibasilar atelectasis or scarring. Electronically Signed   By: JGarald BaldingM.D.   On: 02/20/2017 05:39   Dg Chest Port 1 View  Result Date: 02/21/2017 CLINICAL DATA:  Hypoxia EXAM: PORTABLE CHEST 1 VIEW COMPARISON:  Chest radiograph February 19, 2017 and chest CT February 20, 2017  FINDINGS: Endotracheal tube tip is 5.0 cm above the carina. Nasogastric tube tip and side port below the diaphragm. No pneumothorax. There is atelectatic change in the left base. There is a minimal right pleural effusion. Lungs elsewhere are clear. There is mild cardiomegaly with pulmonary venous hypertension. No adenopathy. No bone lesions. There is aortic atherosclerosis. IMPRESSION: Tube positions as described without pneumothorax. Pulmonary vascular congestion. Mild left base atelectasis and small right pleural effusion, stable. No new opacity evident. There is aortic atherosclerosis. Aortic Atherosclerosis (ICD10-I70.0). Electronically Signed   By: Lowella Grip III M.D.   On: 02/21/2017 07:50   Dg Chest Port 1 View  Result Date: 02/19/2017 CLINICAL DATA:  67 year old female with cough fever and  shortness of breath for 3 days. Bilateral lower extremity edema. EXAM: PORTABLE CHEST 1 VIEW COMPARISON:  08/27/2013 chest radiographs and earlier. FINDINGS: Portable AP upright view at 1559 hrs. Mediastinal contours remain normal. Visualized tracheal air column is within normal limits. Stable lung volumes. Chronic increased pulmonary interstitial opacity, but there is increased pulmonary interstitium or vascularity compared to prior studies. Chronic blunting of the both costophrenic angles. No pneumothorax. No pleural effusion suspected. No confluent pulmonary opacity. IMPRESSION: Acute on chronic pulmonary interstitial opacity. Top differential considerations are viral/atypical respiratory infection and interstitial edema. No definite pleural effusion or other acute cardiopulmonary abnormality. Electronically Signed   By: Genevie Ann M.D.   On: 02/19/2017 16:07   Dg Chest Port 1v Same Day  Result Date: 02/19/2017 CLINICAL DATA:  Endotracheal tube placement. Respiratory distress, acute onset. EXAM: PORTABLE CHEST 1 VIEW COMPARISON:  Chest radiograph performed earlier today at 3:59 p.m. FINDINGS: The patient's endotracheal tube is seen ending 5 cm above the carina. An enteric tube is noted extending below the diaphragm. Mild vascular congestion is noted. Previously noted increased interstitial markings are improved. A small right pleural effusion is noted. No pneumothorax is seen. The cardiomediastinal silhouette is normal in size. No acute osseous abnormalities are identified. IMPRESSION: 1. Endotracheal tube seen ending 5 cm above the carina. 2. Mild vascular congestion. Interval improvement in increased interstitial markings. Small right pleural effusion noted. Electronically Signed   By: Garald Balding M.D.   On: 02/19/2017 22:57    Scheduled Meds: . chlorhexidine gluconate (MEDLINE KIT)  15 mL Mouth Rinse BID  . enoxaparin (LOVENOX) injection  80 mg Subcutaneous Q12H  . ipratropium-albuterol  3 mL  Nebulization Q6H  . levothyroxine  62.5 mcg Intravenous Daily  . mouth rinse  15 mL Mouth Rinse QID  . methylPREDNISolone (SOLU-MEDROL) injection  80 mg Intravenous Q8H  . oseltamivir  75 mg Oral BID   Continuous Infusions: . sodium chloride    . azithromycin Stopped (02/21/17 0359)  . famotidine (PEPCID) IV Stopped (02/21/17 0013)  . fentaNYL infusion INTRAVENOUS 200 mcg/hr (02/21/17 0014)  . piperacillin-tazobactam (ZOSYN)  IV Stopped (02/21/17 0359)  . vancomycin 1,000 mg (02/21/17 0519)    Principal Problem:   COPD exacerbation ([place]) Active Problems:   Tachycardia   Influenza A   Acute respiratory failure [facility])  Critical Care Time spent: 34 minutes  [facility] Pager [phone]. If 7PM-7AM, please contact night-coverage at [email or web address], password [facility] 02/21/2017, 8:16 AM  LOS: 2 days

## 2017-02-21 NOTE — Progress Notes (Signed)
Subjective: She remains intubated and on the ventilator.  She had been admitted to the hospital after having increasing shortness of breath cough with yellow sputum and she was positive for influenza A.  She was initially treated with BiPAP but her PCO2 rose to greater than 100 so she was intubated and started on mechanical she has been on the ventilator for about 36 hours.  She has been receiving steroids nebulizer treatments and antibiotics.  Objective: Vital signs in last 24 hours: Temp:  [96.3 F (35.7 C)-97.5 F (36.4 C)] 97.2 F (36.2 C) (02/13 0400) Pulse Rate:  [62-120] 69 (02/13 0400) Resp:  [11-21] 16 (02/13 0400) BP: (94-138)/(39-65) 104/54 (02/13 0400) SpO2:  [95 %-98 %] 98 % (02/13 0400) FiO2 (%):  [40 %] 40 % (02/13 0400) Weight:  [80.5 kg (177 lb 7.5 oz)] 80.5 kg (177 lb 7.5 oz) (02/13 0500) Weight change: 5.656 kg (12 lb 7.5 oz) Last BM Date: 02/20/17  Intake/Output from previous day: 02/12 0701 - 02/13 0700 In: 1462.6 [I.V.:862.6; IV Piggyback:600] Out: 1050 [Urine:1050]  PHYSICAL EXAM General appearance: Intubated sedated on mechanical ventilation Resp: She has some scattered rhonchi and wheezing but she is moving air better than yesterday Cardio: regular rate and rhythm, S1, S2 normal, no murmur, click, rub or gallop GI: soft, non-tender; bowel sounds normal; no masses,  no organomegaly Extremities: extremities normal, atraumatic, no cyanosis or edema Skin warm and dry  Lab Results:  Results for orders placed or performed during the hospital encounter of 02/19/17 (from the past 48 hour(s))  Comprehensive metabolic panel     Status: Abnormal   Collection Time: 02/19/17  3:39 PM  Result Value Ref Range   Sodium 140 135 - 145 mmol/L   Potassium 3.8 3.5 - 5.1 mmol/L   Chloride 98 (L) 101 - 111 mmol/L   CO2 30 22 - 32 mmol/L   Glucose, Bld 114 (H) 65 - 99 mg/dL   BUN 15 6 - 20 mg/dL   Creatinine, Ser 0.94 0.44 - 1.00 mg/dL   Calcium 8.7 (L) 8.9 - 10.3 mg/dL    Total Protein 7.4 6.5 - 8.1 g/dL   Albumin 3.3 (L) 3.5 - 5.0 g/dL   AST 24 15 - 41 U/L   ALT 14 14 - 54 U/L   Alkaline Phosphatase 113 38 - 126 U/L   Total Bilirubin 0.4 0.3 - 1.2 mg/dL   GFR calc non Af Amer >60 >60 mL/min   GFR calc Af Amer >60 >60 mL/min    Comment: (NOTE) The eGFR has been calculated using the CKD EPI equation. This calculation has not been validated in all clinical situations. eGFR's persistently <60 mL/min signify possible Chronic Kidney Disease.    Anion gap 12 5 - 15    Comment: Performed at [facility], [address] [place], [place] [address]  CBC WITH DIFFERENTIAL     Status: Abnormal   Collection Time: 02/19/17  3:39 PM  Result Value Ref Range   WBC 8.8 4.0 - 10.5 K/uL   RBC 4.27 3.87 - 5.11 MIL/uL   Hemoglobin 12.1 12.0 - 15.0 g/dL   HCT 40.2 36.0 - 46.0 %   MCV 94.1 78.0 - 100.0 fL   MCH 28.3 26.0 - 34.0 pg   MCHC 30.1 30.0 - 36.0 g/dL   RDW 16.4 (H) 11.5 - 15.5 %   Platelets 219 150 - 400 K/uL   Neutrophils Relative % 56 %   Neutro Abs 4.9 1.7 - 7.7 K/uL  Lymphocytes Relative 18 %   Lymphs Abs 1.6 0.7 - 4.0 K/uL   Monocytes Relative 25 %   Monocytes Absolute 2.2 (H) 0.1 - 1.0 K/uL   Eosinophils Relative 1 %   Eosinophils Absolute 0.1 0.0 - 0.7 K/uL   Basophils Relative 0 %   Basophils Absolute 0.0 0.0 - 0.1 K/uL    Comment: Performed at [facility], [address] [place], [place] [address]  Blood Culture (routine x 2)     Status: None (Preliminary result)   Collection Time: 02/19/17  3:39 PM  Result Value Ref Range   Specimen Description LEFT ANTECUBITAL    Special Requests      BOTTLES DRAWN AEROBIC AND ANAEROBIC Blood Culture adequate volume   Culture      NO GROWTH < 24 HOURS Performed at [facility], [address], [place], [place] [address]    Report Status PENDING   Troponin I     Status: None   Collection Time: 02/19/17  3:39 PM  Result Value Ref Range   Troponin I <0.03 <0.03 ng/mL    Comment: Performed at [facility], [address] [place], [place] [address]  Protime-INR     Status: Abnormal   Collection Time: 02/19/17  3:39 PM  Result Value Ref Range   Prothrombin Time 18.7 (H) 11.4 - 15.2 seconds   INR 1.58     Comment: Performed at [facility], [address], [place], [place] [address]  Brain natriuretic peptide     Status: Abnormal   Collection Time: 02/19/17  3:40 PM  Result Value Ref Range   B Natriuretic Peptide 121.0 (H) 0.0 - 100.0 pg/mL    Comment: Performed at [facility], [address], [place], [place] [address]  Influenza panel by PCR (type A & B)     Status: Abnormal   Collection Time: 02/19/17  3:41 PM  Result Value Ref Range   Influenza A By PCR POSITIVE (A) NEGATIVE   Influenza B By PCR NEGATIVE NEGATIVE    Comment: (NOTE) The Xpert Xpress Flu assay is intended as an aid in the diagnosis of  influenza and should not be used as a sole basis for treatment.  This  assay is FDA approved for nasopharyngeal swab specimens only. Nasal  washings and aspirates are unacceptable for Xpert Xpress Flu testing. Performed at [facility], [address] [place], [place] [address]   Blood Culture (routine x 2)     Status: None (Preliminary result)   Collection Time: 02/19/17  3:44 PM  Result Value Ref Range   Specimen Description BLOOD RIGHT HAND    Special Requests      BOTTLES DRAWN AEROBIC AND ANAEROBIC Blood Culture adequate volume   Culture      NO GROWTH < 24 HOURS Performed at [facility], [address], [place],  [address]    Report Status PENDING   Blood gas, arterial ([facility], [facility], [facility])     Status: Abnormal   Collection Time: 02/19/17  3:48 PM  Result Value Ref Range   O2 Content 4.0 L/min   Delivery systems NASAL CANNULA    pH, Arterial 7.299 (L) 7.350 - 7.450   pCO2 arterial 65.4 (HH) 32.0 - 48.0 mmHg    Comment: CRITICAL RESULT CALLED TO, READ BACK BY AND VERIFIED WITH: R. COCKRAM,RN [id number] BY P. LAWSON,RRT ON 02/19/2017    pO2, Arterial 72.4 (L) 83.0 -  108.0 mmHg   Bicarbonate 27.2 20.0 - 28.0 mmol/L  Acid-Base Excess 5.1 (H) 0.0 - 2.0 mmol/L   O2 Saturation 93.7 %   Patient temperature 37.0    Collection site RIGHT RADIAL    Drawn by [phone]    Sample type ARTERIAL    Allens test (pass/fail) PASS PASS    Comment: Performed at [facility], [address], [place], [place] [address]  Lactic acid, plasma     Status: None   Collection Time: 02/19/17  4:29 PM  Result Value Ref Range   Lactic Acid, Venous 1.1 0.5 - 1.9 mmol/L    Comment: Performed at [facility], [address], [place], [place] [address]  Blood gas, arterial     Status: Abnormal   Collection Time: 02/19/17  7:54 PM  Result Value Ref Range   FIO2 40.00    Delivery systems BILEVEL POSITIVE AIRWAY PRESSURE    LHR 10 resp/min   Inspiratory PAP 14    Expiratory PAP 5    pH, Arterial 7.152 (LL) 7.350 - 7.450    Comment: CRITICAL RESULT CALLED TO, READ BACK BY AND VERIFIED WITH:  DR. Jeneen Rinks KIM 02/19/2017 2020 KRW    pCO2 arterial 84.8 (HH) 32.0 - 48.0 mmHg    Comment: CRITICAL RESULT CALLED TO, READ BACK BY AND VERIFIED WITH:  DR. Jeneen Rinks KIM 02/19/2017 2020  KRW    pO2, Arterial 81.2 (L) 83.0 - 108.0 mmHg   Bicarbonate 22.4 20.0 - 28.0 mmol/L   Acid-Base Excess 0.3 0.0 - 2.0 mmol/L   O2 Saturation 93.0 %   Patient temperature 37.0    Drawn by [id number]    Sample type ARTERIAL DRAW    Allens test (pass/fail) PASS PASS    Comment: Performed at [facility], [address] [place], [place] [address]  Blood gas, arterial     Status: Abnormal   Collection Time: 02/19/17  9:00 PM  Result Value Ref Range   FIO2 40.00    Delivery systems BILEVEL POSITIVE AIRWAY PRESSURE    LHR 30 resp/min   Inspiratory PAP 18    Expiratory PAP 6    pH, Arterial 7.065 (LL) 7.350 - 7.450    Comment: CRITICAL RESULT CALLED TO, READ BACK BY AND VERIFIED WITH:  DR. Jeneen Rinks KIM 02/19/2017 2200 KRW    pCO2 arterial 106 (HH) 32.0 - 48.0 mmHg    Comment: CRITICAL RESULT CALLED TO, READ BACK BY AND  VERIFIED WITH:  DR. Jeneen Rinks KIM 02/19/2017 2200 KRW    pO2, Arterial 60.0 (L) 83.0 - 108.0 mmHg   Bicarbonate 20.4 20.0 - 28.0 mmol/L   Acid-base deficit 0.5 0.0 - 2.0 mmol/L   O2 Saturation 81.9 %   Patient temperature 37.0    Collection site RIGHT RADIAL    Drawn by [phone]    Sample type ARTERIAL DRAW    Allens test (pass/fail) PASS PASS    Comment: Performed at [facility], [address] [place], [place] [address]  Urinalysis, Routine w reflex microscopic     Status: Abnormal   Collection Time: 02/20/17 12:00 AM  Result Value Ref Range   Color, Urine YELLOW YELLOW   APPearance HAZY (A) CLEAR   Specific Gravity, Urine >1.030 (H) 1.005 - 1.030   pH 6.0 5.0 - 8.0   Glucose, UA NEGATIVE NEGATIVE mg/dL   Hgb urine dipstick LARGE (A) NEGATIVE   Bilirubin Urine NEGATIVE NEGATIVE   Ketones, ur NEGATIVE NEGATIVE mg/dL   Protein, ur 100 (A) NEGATIVE mg/dL   Nitrite NEGATIVE NEGATIVE   Leukocytes, UA NEGATIVE NEGATIVE  Comment: Performed at [facility], [address] [place], [place] [address]  Urinalysis, Microscopic (reflex)     Status: Abnormal   Collection Time: 02/20/17 12:00 AM  Result Value Ref Range   RBC / HPF 6-30 0 - 5 RBC/hpf   WBC, UA 0-5 0 - 5 WBC/hpf   Bacteria, UA RARE (A) NONE SEEN   Squamous Epithelial / LPF 6-30 (A) NONE SEEN    Comment: Performed at [facility], [address], [place], [place] [address]  MRSA PCR Screening     Status: None   Collection Time: 02/20/17 12:01 AM  Result Value Ref Range   MRSA by PCR NEGATIVE NEGATIVE    Comment:        The GeneXpert MRSA Assay (FDA approved for NASAL specimens only), is one component of a comprehensive MRSA colonization surveillance program. It is not intended to diagnose MRSA infection nor to guide or monitor treatment for MRSA infections. Performed at [facility], [address] [place], [place] [address]   Blood gas, arterial     Status: Abnormal   Collection Time: 02/20/17 12:20 AM  Result Value  Ref Range   FIO2 50.00    Delivery systems VENTILATOR    Mode PRESSURE REGULATED VOLUME CONTROL    VT 450 mL   LHR 20 resp/min   Peep/cpap 5.0 cm H20   pH, Arterial 7.201 (L) 7.350 - 7.450   pCO2 arterial 74.4 (HH) 32.0 - 48.0 mmHg    Comment: CRITICAL RESULT CALLED TO, READ BACK BY AND VERIFIED WITH:  Georgina Peer, RN BY B. FRETWELL @ 0036 02/20/2017    pO2, Arterial 98.3 83.0 - 108.0 mmHg   Bicarbonate 22.6 20.0 - 28.0 mmol/L   Acid-Base Excess 0.8 0.0 - 2.0 mmol/L   O2 Saturation 98.3 %   Patient temperature 37.0    Collection site RIGHT RADIAL    Drawn by [phone]    Sample type ARTERIAL DRAW    Allens test (pass/fail) PASS PASS    Comment: Performed at [facility], [address] [place], [place] [address]  Troponin I (q 6hr x 3)     Status: None   Collection Time: 02/20/17 12:42 AM  Result Value Ref Range   Troponin I <0.03 <0.03 ng/mL    Comment: Performed at [facility], [address] [place], [place] [address]  D-dimer, quantitative (not at [facility])     Status: Abnormal   Collection Time: 02/20/17 12:42 AM  Result Value Ref Range   D-Dimer, Quant 1.94 (H) 0.00 - 0.50 ug/mL-FEU    Comment: (NOTE) At the manufacturer cut-off of 0.50 ug/mL FEU, this assay has been documented to exclude PE with a sensitivity and negative predictive value of 97 to 99%.  At this time, this assay has not been approved by the FDA to exclude DVT/VTE. Results should be correlated with clinical presentation. Performed at [facility], [address] [place], [place] [address]   Troponin I (q 6hr x 3)     Status: None   Collection Time: 02/20/17  6:19 AM  Result Value Ref Range   Troponin I <0.03 <0.03 ng/mL    Comment: Performed at [facility], [address], [place],  [address]  CBC     Status: Abnormal   Collection Time: 02/20/17  6:19 AM  Result Value Ref Range   WBC 5.5 4.0 - 10.5 K/uL   RBC 3.93 3.87 - 5.11 MIL/uL   Hemoglobin 11.2 (L) 12.0 - 15.0 g/dL  HCT 37.2 36.0 -  46.0 %   MCV 94.7 78.0 - 100.0 fL   MCH 28.5 26.0 - 34.0 pg   MCHC 30.1 30.0 - 36.0 g/dL   RDW 16.4 (H) 11.5 - 15.5 %   Platelets 184 150 - 400 K/uL    Comment: Performed at [facility], [address], [place], [place] [address]  Comprehensive metabolic panel     Status: Abnormal   Collection Time: 02/20/17  6:19 AM  Result Value Ref Range   Sodium 138 135 - 145 mmol/L   Potassium 4.3 3.5 - 5.1 mmol/L   Chloride 102 101 - 111 mmol/L   CO2 28 22 - 32 mmol/L   Glucose, Bld 128 (H) 65 - 99 mg/dL   BUN 17 6 - 20 mg/dL   Creatinine, Ser 0.70 0.44 - 1.00 mg/dL   Calcium 8.0 (L) 8.9 - 10.3 mg/dL   Total Protein 6.1 (L) 6.5 - 8.1 g/dL   Albumin 2.6 (L) 3.5 - 5.0 g/dL   AST 22 15 - 41 U/L   ALT 15 14 - 54 U/L   Alkaline Phosphatase 88 38 - 126 U/L   Total Bilirubin 0.4 0.3 - 1.2 mg/dL   GFR calc non Af Amer >60 >60 mL/min   GFR calc Af Amer >60 >60 mL/min    Comment: (NOTE) The eGFR has been calculated using the CKD EPI equation. This calculation has not been validated in all clinical situations. eGFR's persistently <60 mL/min signify possible Chronic Kidney Disease.    Anion gap 8 5 - 15    Comment: Performed at [facility], [address] [place], [place] [address]  Protime-INR     Status: Abnormal   Collection Time: 02/20/17  6:19 AM  Result Value Ref Range   Prothrombin Time 19.0 (H) 11.4 - 15.2 seconds   INR 1.61     Comment: Performed at [facility], [address], [place], [place] [address]  Blood gas, arterial     Status: Abnormal   Collection Time: 02/20/17 10:35 AM  Result Value Ref Range   FIO2 40.00    Delivery systems VENTILATOR    Mode PRESSURE REGULATED VOLUME CONTROL    VT 450 mL   LHR 12 resp/min   Peep/cpap 5.0 cm H20   pH, Arterial 7.23 (L) 7.350 - 7.450   pCO2 arterial 66.9 (HH) 32.0 - 48.0 mmHg    Comment: CRITICAL RESULT CALLED TO, READ BACK BY AND VERIFIED WITH: J.MOTLEY,RN BY J.ROWLAND,RRT ON 02/20/17 AT 1045.    pO2, Arterial 114 (H) 83.0 -  108.0 mmHg   Bicarbonate 23.8 20.0 - 28.0 mmol/L   Acid-Base Excess 0.9 0.0 - 2.0 mmol/L   O2 Saturation 97.6 %   Patient temperature 37.4    Collection site RIGHT RADIAL    Drawn by [id number]    Sample type ARTERIAL DRAW    Allens test (pass/fail) PASS PASS    Comment: Performed at [facility], [address], [place], [place] [address]  Troponin I (q 6hr x 3)     Status: None   Collection Time: 02/20/17 12:34 PM  Result Value Ref Range   Troponin I <0.03 <0.03 ng/mL    Comment: Performed at [facility], [address] [place], [place] [address]  Blood gas, arterial     Status: Abnormal   Collection Time: 02/21/17  4:40 AM  Result Value Ref Range   FIO2 40.00    Delivery systems VENTILATOR    Mode PRESSURE  REGULATED VOLUME CONTROL    VT 500 mL   LHR 16.0 resp/min   Peep/cpap 5.0 cm H20   pH, Arterial 7.253 (L) 7.350 - 7.450   pCO2 arterial 64.7 (H) 32.0 - 48.0 mmHg   pO2, Arterial 148 (H) 83.0 - 108.0 mmHg   Bicarbonate 23.4 20.0 - 28.0 mmol/L   Acid-Base Excess 1.1 0.0 - 2.0 mmol/L   O2 Saturation 98.2 %   Collection site RIGHT RADIAL    Drawn by [id number]    Sample type ARTERIAL DRAW    Allens test (pass/fail) PASS PASS    Comment: Performed at [facility], [address] [place], [place] [address]  Basic metabolic panel     Status: Abnormal   Collection Time: 02/21/17  5:32 AM  Result Value Ref Range   Sodium 139 135 - 145 mmol/L   Potassium 4.2 3.5 - 5.1 mmol/L   Chloride 105 101 - 111 mmol/L   CO2 27 22 - 32 mmol/L   Glucose, Bld 153 (H) 65 - 99 mg/dL   BUN 27 (H) 6 - 20 mg/dL   Creatinine, Ser 0.89 0.44 - 1.00 mg/dL   Calcium 8.5 (L) 8.9 - 10.3 mg/dL   GFR calc non Af Amer >60 >60 mL/min   GFR calc Af Amer >60 >60 mL/min    Comment: (NOTE) The eGFR has been calculated using the CKD EPI equation. This calculation has not been validated in all clinical situations. eGFR's persistently <60 mL/min signify possible Chronic Kidney Disease.    Anion gap 7 5 - 15     Comment: Performed at [facility], [address], [place], [place] [address]  CBC     Status: Abnormal   Collection Time: 02/21/17  5:32 AM  Result Value Ref Range   WBC 7.1 4.0 - 10.5 K/uL   RBC 3.92 3.87 - 5.11 MIL/uL   Hemoglobin 11.2 (L) 12.0 - 15.0 g/dL   HCT 37.2 36.0 - 46.0 %   MCV 94.9 78.0 - 100.0 fL   MCH 28.6 26.0 - 34.0 pg   MCHC 30.1 30.0 - 36.0 g/dL   RDW 16.5 (H) 11.5 - 15.5 %   Platelets 204 150 - 400 K/uL    Comment: Performed at [facility], [address], [place], [place] [address]    ABGS Recent Labs    02/21/17 0440  PHART 7.253*  PO2ART 148*  HCO3 23.4   CULTURES Recent Results (from the past 240 hour(s))  Blood Culture (routine x 2)     Status: None (Preliminary result)   Collection Time: 02/19/17  3:39 PM  Result Value Ref Range Status   Specimen Description LEFT ANTECUBITAL  Final   Special Requests   Final    BOTTLES DRAWN AEROBIC AND ANAEROBIC Blood Culture adequate volume   Culture   Final    NO GROWTH < 24 HOURS Performed at [facility], [address], [place], [place] [address]    Report Status PENDING  Incomplete  Blood Culture (routine x 2)     Status: None (Preliminary result)   Collection Time: 02/19/17  3:44 PM  Result Value Ref Range Status   Specimen Description BLOOD RIGHT HAND  Final   Special Requests   Final    BOTTLES DRAWN AEROBIC AND ANAEROBIC Blood Culture adequate volume   Culture   Final    NO GROWTH < 24 HOURS Performed at [facility], [address], [place], [place] [address]    Report Status PENDING  Incomplete  MRSA PCR Screening     Status: None   Collection Time: 02/20/17 12:01 AM  Result Value Ref Range Status   MRSA by PCR NEGATIVE NEGATIVE Final    Comment:        The GeneXpert MRSA Assay (FDA approved for NASAL specimens only), is one component of a comprehensive MRSA colonization surveillance program. It is not intended to diagnose MRSA infection nor to guide or monitor treatment for MRSA  infections. Performed at [facility], [address], [place], [place] [address]    Studies/Results: Ct Angio Chest Pe W Or Wo Contrast  Result Date: 02/20/2017 CLINICAL DATA:  Acute onset of dyspnea. EXAM: CT ANGIOGRAPHY CHEST WITH CONTRAST TECHNIQUE: Multidetector CT imaging of the chest was performed using the standard protocol during bolus administration of intravenous contrast. Multiplanar CT image reconstructions and MIPs were obtained to evaluate the vascular anatomy. CONTRAST:  181m ISOVUE-370 IOPAMIDOL (ISOVUE-370) INJECTION 76% COMPARISON:  CTA of the chest performed 08/25/2013, and chest radiograph performed 02/19/2017 FINDINGS: Cardiovascular:  There is no evidence of pulmonary embolus. The heart is normal in size. Scattered calcification is noted along the thoracic aorta and proximal great vessels. Mediastinum/Nodes: The patient's endotracheal tube is seen ending 2-3 cm above the carina. No definite mediastinal lymphadenopathy is seen. No pericardial effusion is identified. The visualized portions of the thyroid gland are diminutive and grossly unremarkable. No axillary lymphadenopathy is seen. Lungs/Pleura: Bilateral emphysema is noted, particularly at the upper lung lobes. Trace left-sided pleural fluid is noted. Bibasilar atelectasis or scarring is seen. There is no evidence of pneumothorax. No mass is identified. Upper Abdomen: The visualized portions of the liver and spleen are unremarkable. Musculoskeletal: No acute osseous abnormalities are identified. The visualized musculature is unremarkable in appearance. Review of the MIP images confirms the above findings. IMPRESSION: 1. No evidence of pulmonary embolus. 2. Bilateral emphysema, particularly at the upper lung lobes. Trace left-sided pleural fluid. Bibasilar atelectasis or scarring. Electronically Signed   By: JGarald BaldingM.D.   On: 02/20/2017 05:39   Dg Chest Port 1 View  Result Date: 02/19/2017 CLINICAL DATA:  67year old  female with cough fever and shortness of breath for 3 days. Bilateral lower extremity edema. EXAM: PORTABLE CHEST 1 VIEW COMPARISON:  08/27/2013 chest radiographs and earlier. FINDINGS: Portable AP upright view at 1559 hrs. Mediastinal contours remain normal. Visualized tracheal air column is within normal limits. Stable lung volumes. Chronic increased pulmonary interstitial opacity, but there is increased pulmonary interstitium or vascularity compared to prior studies. Chronic blunting of the both costophrenic angles. No pneumothorax. No pleural effusion suspected. No confluent pulmonary opacity. IMPRESSION: Acute on chronic pulmonary interstitial opacity. Top differential considerations are viral/atypical respiratory infection and interstitial edema. No definite pleural effusion or other acute cardiopulmonary abnormality. Electronically Signed   By: HGenevie AnnM.D.   On: 02/19/2017 16:07   Dg Chest Port 1v Same Day  Result Date: 02/19/2017 CLINICAL DATA:  Endotracheal tube placement. Respiratory distress, acute onset. EXAM: PORTABLE CHEST 1 VIEW COMPARISON:  Chest radiograph performed earlier today at 3:59 p.m. FINDINGS: The patient's endotracheal tube is seen ending 5 cm above the carina. An enteric tube is noted extending below the diaphragm. Mild vascular congestion is noted. Previously noted increased interstitial markings are improved. A small right pleural effusion is noted. No pneumothorax is seen. The cardiomediastinal silhouette is normal in size. No acute osseous abnormalities are identified. IMPRESSION: 1. Endotracheal tube seen ending 5 cm above the carina. 2. Mild vascular congestion. Interval  improvement in increased interstitial markings. Small right pleural effusion noted. Electronically Signed   By: Garald Balding M.D.   On: 02/19/2017 22:57    Medications:  Prior to Admission:  Medications Prior to Admission  Medication Sig Dispense Refill Last Dose  . albuterol (PROVENTIL) (2.5 MG/3ML)  0.083% nebulizer solution USE 1 VIAL BY NEBULIZER EVERY 4 HOURS AS NEEDED FOR WHEEZING. DX: J44.9 375 vial 0   . budesonide (PULMICORT) 0.5 MG/2ML nebulizer solution Take 2 mLs (0.5 mg total) by nebulization 2 (two) times daily. Dx: J43.9 120 mL 3   . fentaNYL (DURAGESIC - DOSED MCG/HR) 50 MCG/HR Place 50 mcg onto the skin every 3 (three) days.   Taking  . fish oil-omega-3 fatty acids 1000 MG capsule Take 2 g by mouth daily.     Taking  . ipratropium-albuterol (DUONEB) 0.5-2.5 (3) MG/3ML SOLN 1 vial via nebulizer at breakfast, lunch, dinner and bedtime daily. May take 2 additional treatments on bad day Dx: J43.9 360 mL 3   . levothyroxine (SYNTHROID, LEVOTHROID) 125 MCG tablet take 1 tablet by mouth once daily 90 tablet 2 Taking  . Magnesium 500 MG CAPS Take 500 mg by mouth.   Taking  . nitroGLYCERIN (NITROSTAT) 0.4 MG SL tablet Place 1 tablet (0.4 mg total) under the tongue every 5 (five) minutes as needed for chest pain. 25 tablet 3 Taking  . Respiratory Therapy Supplies (FLUTTER) DEVI Use as directed 1 each 0 Taking  . ticagrelor (BRILINTA) 60 MG TABS tablet Take 1 tablet (60 mg total) by mouth 2 (two) times daily. (Patient not taking: Reported on 01/24/2017) 60 tablet 11 Not Taking  . warfarin (COUMADIN) 1 MG tablet Take 1 tablet ('1mg'$ ) on Tuesdays and Saturdays along with a '5mg'$  tablet 30 tablet 4 Taking  . warfarin (COUMADIN) 5 MG tablet Take 1 tablet (5 mg total) by mouth daily. 90 tablet 3    Scheduled: . chlorhexidine gluconate (MEDLINE KIT)  15 mL Mouth Rinse BID  . enoxaparin (LOVENOX) injection  80 mg Subcutaneous Q12H  . ipratropium-albuterol  3 mL Nebulization Q6H  . levothyroxine  62.5 mcg Intravenous Daily  . mouth rinse  15 mL Mouth Rinse QID  . methylPREDNISolone (SOLU-MEDROL) injection  80 mg Intravenous Q8H  . oseltamivir  75 mg Oral BID   Continuous: . sodium chloride    . azithromycin Stopped (02/21/17 0359)  . famotidine (PEPCID) IV Stopped (02/21/17 0013)  . fentaNYL  infusion INTRAVENOUS 200 mcg/hr (02/21/17 0014)  . piperacillin-tazobactam (ZOSYN)  IV Stopped (02/21/17 [id number])  . vancomycin 1,000 mg (02/21/17 0519)   NTZ:GYFVCB chloride, albuterol, midazolam  Assesment: She has acute on chronic respiratory failure with hypercapnia.  I have not seen her before but based on how high her PCO2 went with initial treatment I would have expected that she normally has a substantially elevated PCO2.  However this morning with a PCO2 of 64 she has pH of 7.25 which would be more indicative of acute problem with CO2 retention.  We will plan to increase her ventilator rate  She has COPD exacerbation which is being treated  She has influenza A which is being treated Principal Problem:   COPD exacerbation ([place]) Active Problems:   Tachycardia   Influenza A   Acute respiratory failure ([place])    Plan: Continue treatments.  Increase her ventilator rate.  Recheck blood gas after that.    LOS: 2 days   Jerrian Mells L 02/21/2017, 7:17 AM

## 2017-02-21 NOTE — Progress Notes (Signed)
*  PRELIMINARY RESULTS* Echocardiogram 2D Echocardiogram has been performed.  [name] 02/21/2017, 3:45 PM

## 2017-02-21 NOTE — Progress Notes (Signed)
Initial Nutrition Assessment  DOCUMENTATION CODES:  Not applicable  INTERVENTION:  Switch TF to Vital AF 1.2 @ goal rate of 50cc/hr  to provide 1440 kcals, 90 gm protein, 973 ml free water daily.  NUTRITION DIAGNOSIS:  Inadequate oral intake related to inability to eat as evidenced by NPO status.  GOAL:  Patient will meet greater than or equal to 90% of their needs  MONITOR:  Diet advancement, Vent status, Labs, Weight trends  REASON FOR ASSESSMENT:  Consult Enteral/tube feeding initiation and management  ASSESSMENT:  67 y/o female PMHx tobacco abuse (50 pack year), HTN/HLD, Noncompliance, FactorV Leiden def, COPD (o2 dependent), MI, CAD. Presented with SOB, coughing, fever for a few days.  Found to be influenza A positive. Intubated 2/11 due to worsening respiratory distress.   Pt remains intubated, sedated. No family, friends or other historians present. Still Unable to obtain any untake hx.   Temp 36.5C.   TF infusing @ 40. Abdomen remains slightly firm. Labs/meds relatively unchanged.   Readjusted estimated needs.   Per chart, pt admitted at 173 lbs 1 oz (78.9 kg). Recent UBW appears to have been 165-170 lbs. She appears to  have lost 10-15 lbs in about 1 year, 20-25 lbs x 3 years   Patient is currently intubated on ventilator support MV: 9.8 L/min Temp (24hrs), Avg:97.2 F (36.2 C), Min:96.8 F (36 C), Max:97.5 F (36.4 C) Propofol: None, sedated on fentanyl  Her initial temp was 39.4 degrees C, however, every other reading has been <36.6. Will use 36.6 for kcal/pro dosing purposes, since patient actually has been more hypothermic today. Will substantially lower kcal needs; approximately 470 less  Labs: Albumin: 2.6, Glu:153 w/ start of TF Meds: Methylprednisolone, Tamiflu, Fentanyl, IV abx, IVF, IV H2RA  Recent Labs  Lab 02/19/17 1539 02/20/17 0619 02/21/17 0532  NA 140 138 139  K 3.8 4.3 4.2  CL 98* 102 105  CO2 30 28 27   BUN 15 17 27*  CREATININE 0.94  0.70 0.89  CALCIUM 8.7* 8.0* 8.5*  GLUCOSE 114* 128* 153*   NUTRITION - FOCUSED PHYSICAL EXAM: WDL- mild edema  Diet Order:  Diet NPO time specified  EDUCATION NEEDS:  No education needs have been identified at this time   Skin:  Skin Assessment: Reviewed RN Assessment  Last BM:  2/12  Height:  Ht Readings from Last 1 Encounters:  02/20/17 5\' 5"  (1.651 m)   Weight:  Wt Readings from Last 1 Encounters:  02/21/17 177 lb 7.5 oz (80.5 kg)   Wt Readings from Last 10 Encounters:  02/21/17 177 lb 7.5 oz (80.5 kg)  01/24/17 167 lb 12.8 oz (76.1 kg)  09/21/16 170 lb 12.8 oz (77.5 kg)  09/06/16 174 lb (78.9 kg)  03/14/16 181 lb 8 oz (82.3 kg)  12/06/15 187 lb (84.8 kg)  06/01/15 195 lb (88.5 kg)  05/14/15 195 lb (88.5 kg)  11/26/14 161 lb (73 kg)  10/28/14 195 lb (88.5 kg)   Ideal Body Weight:  56.82 kg  BMI:  Body mass index using admit weight is 28.9 kg/m.  Estimated Nutritional Needs:  Kcal:  1485 kcals (PSU 2003 B) Protein:  91-102g Pro (1.6-1.8 g/kg ibw) Fluid:  Per MDs goals  Burtis Junes RD, LDN, CNSC Clinical Nutrition Pager: [phone] 02/21/2017 12:54 PM

## 2017-02-22 ENCOUNTER — Encounter (HOSPITAL_COMMUNITY): Payer: Self-pay | Admitting: Family Medicine

## 2017-02-22 ENCOUNTER — Inpatient Hospital Stay (HOSPITAL_COMMUNITY): Payer: Medicare Other

## 2017-02-22 LAB — BLOOD GAS, ARTERIAL
Acid-Base Excess: 1.4 mmol/L (ref 0.0–2.0)
BICARBONATE: 24 mmol/L (ref 20.0–28.0)
DRAWN BY: 317771
FIO2: 40
MECHVT: 500 mL
O2 Saturation: 98.8 %
PEEP/CPAP: 5 cmH2O
RATE: 20 resp/min
pCO2 arterial: 57.3 mmHg — ABNORMAL HIGH (ref 32.0–48.0)
pH, Arterial: 7.298 — ABNORMAL LOW (ref 7.350–7.450)
pO2, Arterial: 211 mmHg — ABNORMAL HIGH (ref 83.0–108.0)

## 2017-02-22 LAB — GLUCOSE, CAPILLARY
GLUCOSE-CAPILLARY: 103 mg/dL — AB (ref 65–99)
GLUCOSE-CAPILLARY: 120 mg/dL — AB (ref 65–99)
GLUCOSE-CAPILLARY: 110 mg/dL — AB (ref 65–99)
GLUCOSE-CAPILLARY: 139 mg/dL — AB (ref 65–99)
Glucose-Capillary: 104 mg/dL — ABNORMAL HIGH (ref 65–99)

## 2017-02-22 LAB — BASIC METABOLIC PANEL
Anion gap: 8 (ref 5–15)
BUN: 35 mg/dL — ABNORMAL HIGH (ref 6–20)
CALCIUM: 8.6 mg/dL — AB (ref 8.9–10.3)
CO2: 26 mmol/L (ref 22–32)
Chloride: 104 mmol/L (ref 101–111)
Creatinine, Ser: 0.99 mg/dL (ref 0.44–1.00)
GFR, EST NON AFRICAN AMERICAN: 58 mL/min — AB (ref >60)
Glucose, Bld: 117 mg/dL — ABNORMAL HIGH (ref 65–99)
Potassium: 4.4 mmol/L (ref 3.5–5.1)
SODIUM: 138 mmol/L (ref 135–145)

## 2017-02-22 LAB — CBC
HCT: 35.9 % — ABNORMAL LOW (ref 36.0–46.0)
HEMOGLOBIN: 11.1 g/dL — AB (ref 12.0–15.0)
MCH: 28.8 pg (ref 26.0–34.0)
MCHC: 30.9 g/dL (ref 30.0–36.0)
MCV: 93.2 fL (ref 78.0–100.0)
PLATELETS: 229 10*3/uL (ref 150–400)
RBC: 3.85 MIL/uL — AB (ref 3.87–5.11)
RDW: 16.7 % — ABNORMAL HIGH (ref 11.5–15.5)
WBC: 8.4 10*3/uL (ref 4.0–10.5)

## 2017-02-22 LAB — MAGNESIUM
MAGNESIUM: 2.1 mg/dL (ref 1.7–2.4)
MAGNESIUM: 1.9 mg/dL (ref 1.7–2.4)

## 2017-02-22 LAB — PHOSPHORUS
PHOSPHORUS: 2.7 mg/dL (ref 2.5–4.6)
PHOSPHORUS: 3 mg/dL (ref 2.5–4.6)

## 2017-02-22 MED ORDER — FENTANYL CITRATE (PF) 2500 MCG/50ML IJ SOLN
INTRAMUSCULAR | Status: AC
  Filled 2017-02-22: qty 50

## 2017-02-22 MED ORDER — FUROSEMIDE 10 MG/ML IJ SOLN
40.0000 mg | Freq: Two times a day (BID) | INTRAMUSCULAR | Status: AC
  Administered 2017-02-22 (×2): 40 mg via INTRAVENOUS
  Filled 2017-02-22 (×2): qty 4

## 2017-02-22 MED ORDER — PIPERACILLIN-TAZOBACTAM 3.375 G IVPB
3.3750 g | Freq: Three times a day (TID) | INTRAVENOUS | Status: AC
  Administered 2017-02-22 – 2017-02-28 (×18): 3.375 g via INTRAVENOUS
  Filled 2017-02-22 (×16): qty 50

## 2017-02-22 NOTE — Progress Notes (Signed)
Subjective: She was admitted with increasing shortness of breath was found to have influenza.  She had elevated PCO2 and was hypoxic.  Treatment was attempted with BiPAP but she continued to elevate her PCO2 and it finally got over 100 which culminated in her being intubated and placed on mechanical ventilation.  She is still on the ventilator.  Her PCO2 is now down to around 55-60 but she still has a low pH with that.  I had increased her respiratory rate.  I am somewhat surprised that her pH is low with a PCO2 where it is because I think she probably has a normal PC of at least 50-55 considering her very high PCO2 on admission.  Objective: Vital signs in last 24 hours: Temp:  [97.2 F (36.2 C)-98.4 F (36.9 C)] 98.2 F (36.8 C) (02/14 0600) Pulse Rate:  [53-119] 55 (02/14 0600) Resp:  [20-21] 20 (02/14 0600) BP: (95-134)/(49-82) 107/53 (02/14 0600) SpO2:  [91 %-99 %] 99 % (02/14 0600) FiO2 (%):  [40 %] 40 % (02/14 0345) Weight:  [81.3 kg (179 lb 3.7 oz)] 81.3 kg (179 lb 3.7 oz) (02/14 0401) Weight change: 0.8 kg (1 lb 12.2 oz) Last BM Date: 02/20/17  Intake/Output from previous day: 02/13 0701 - 02/14 0700 In: 3039 [I.V.:703.2; NG/GT:785.8; IV Piggyback:1550] Out: 1650 [Urine:1050; Emesis/NG output:600]  PHYSICAL EXAM General appearance: Intubated sedated on mechanical ventilation Resp: wheezes bilaterally Cardio: regular rate and rhythm, S1, S2 normal, no murmur, click, rub or gallop GI: soft, non-tender; bowel sounds normal; no masses,  no organomegaly Extremities: extremities normal, atraumatic, no cyanosis or edema Skin warm and dry  Lab Results:  Results for orders placed or performed during the hospital encounter of 02/19/17 (from the past 48 hour(s))  Blood gas, arterial     Status: Abnormal   Collection Time: 02/20/17 10:35 AM  Result Value Ref Range   FIO2 40.00    Delivery systems VENTILATOR    Mode PRESSURE REGULATED VOLUME CONTROL    VT 450 mL   LHR 12 resp/min    Peep/cpap 5.0 cm H20   pH, Arterial 7.23 (L) 7.350 - 7.450   pCO2 arterial 66.9 (HH) 32.0 - 48.0 mmHg    Comment: CRITICAL RESULT CALLED TO, READ BACK BY AND VERIFIED WITH: J.MOTLEY,RN BY J.ROWLAND,RRT ON 02/20/17 AT 1045.    pO2, Arterial 114 (H) 83.0 - 108.0 mmHg   Bicarbonate 23.8 20.0 - 28.0 mmol/L   Acid-Base Excess 0.9 0.0 - 2.0 mmol/L   O2 Saturation 97.6 %   Patient temperature 37.4    Collection site RIGHT RADIAL    Drawn by [id number]    Sample type ARTERIAL DRAW    Allens test (pass/fail) PASS PASS    Comment: Performed at [facility], [address], [place], [place] [address]  Troponin I (q 6hr x 3)     Status: None   Collection Time: 02/20/17 12:34 PM  Result Value Ref Range   Troponin I <0.03 <0.03 ng/mL    Comment: Performed at [facility], [address] [place], [place] [address]  Blood gas, arterial     Status: Abnormal   Collection Time: 02/21/17  4:40 AM  Result Value Ref Range   FIO2 40.00    Delivery systems VENTILATOR    Mode PRESSURE REGULATED VOLUME CONTROL    VT 500 mL   LHR 16.0 resp/min   Peep/cpap 5.0 cm H20   pH, Arterial 7.253 (L) 7.350 - 7.450   pCO2 arterial 64.7 (H) 32.0 -  48.0 mmHg   pO2, Arterial 148 (H) 83.0 - 108.0 mmHg   Bicarbonate 23.4 20.0 - 28.0 mmol/L   Acid-Base Excess 1.1 0.0 - 2.0 mmol/L   O2 Saturation 98.2 %   Collection site RIGHT RADIAL    Drawn by [id number]    Sample type ARTERIAL DRAW    Allens test (pass/fail) PASS PASS    Comment: Performed at [facility], [address] [place], [place] [address]  Basic metabolic panel     Status: Abnormal   Collection Time: 02/21/17  5:32 AM  Result Value Ref Range   Sodium 139 135 - 145 mmol/L   Potassium 4.2 3.5 - 5.1 mmol/L   Chloride 105 101 - 111 mmol/L   CO2 27 22 - 32 mmol/L   Glucose, Bld 153 (H) 65 - 99 mg/dL   BUN 27 (H) 6 - 20 mg/dL   Creatinine, Ser 0.89 0.44 - 1.00 mg/dL   Calcium 8.5 (L) 8.9 - 10.3 mg/dL   GFR calc non Af Amer >60 >60 mL/min   GFR calc Af Amer  >60 >60 mL/min    Comment: (NOTE) The eGFR has been calculated using the CKD EPI equation. This calculation has not been validated in all clinical situations. eGFR's persistently <60 mL/min signify possible Chronic Kidney Disease.    Anion gap 7 5 - 15    Comment: Performed at [facility], [address] [place], [place] [address]  CBC     Status: Abnormal   Collection Time: 02/21/17  5:32 AM  Result Value Ref Range   WBC 7.1 4.0 - 10.5 K/uL   RBC 3.92 3.87 - 5.11 MIL/uL   Hemoglobin 11.2 (L) 12.0 - 15.0 g/dL   HCT 37.2 36.0 - 46.0 %   MCV 94.9 78.0 - 100.0 fL   MCH 28.6 26.0 - 34.0 pg   MCHC 30.1 30.0 - 36.0 g/dL   RDW 16.5 (H) 11.5 - 15.5 %   Platelets 204 150 - 400 K/uL    Comment: Performed at [facility], [address], [place], [place] [address]  Blood gas, arterial     Status: Abnormal   Collection Time: 02/21/17 11:03 AM  Result Value Ref Range   FIO2 0.40    Delivery systems VENTILATOR    Mode PRESSURE REGULATED VOLUME CONTROL    VT 500 mL   LHR 20 resp/min   Peep/cpap 5.0 cm H20   pH, Arterial 7.299 (L) 7.350 - 7.450   pCO2 arterial 57.3 (H) 32.0 - 48.0 mmHg   pO2, Arterial 80.7 (L) 83.0 - 108.0 mmHg   Bicarbonate 24.8 20.0 - 28.0 mmol/L   Acid-Base Excess 1.5 0.0 - 2.0 mmol/L   O2 Saturation 95.2 %   Collection site RIGHT RADIAL    Drawn by [phone]    Sample type ARTERIAL    Allens test (pass/fail) PASS PASS    Comment: Performed at [facility], [address], [place], [place] [address]  Glucose, capillary     Status: Abnormal   Collection Time: 02/21/17  1:44 PM  Result Value Ref Range   Glucose-Capillary 110 (H) 65 - 99 mg/dL  Glucose, capillary     Status: Abnormal   Collection Time: 02/21/17  4:29 PM  Result Value Ref Range   Glucose-Capillary 118 (H) 65 - 99 mg/dL  Glucose, capillary     Status: Abnormal   Collection Time: 02/21/17  7:48 PM  Result Value Ref Range   Glucose-Capillary 121 (H) 65 - 99 mg/dL  Comment 1 Notify RN    Comment 2  Document in Chart   Glucose, capillary     Status: Abnormal   Collection Time: 02/21/17 11:40 PM  Result Value Ref Range   Glucose-Capillary 115 (H) 65 - 99 mg/dL   Comment 1 Notify RN    Comment 2 Document in Chart   Glucose, capillary     Status: Abnormal   Collection Time: 02/22/17  4:04 AM  Result Value Ref Range   Glucose-Capillary 103 (H) 65 - 99 mg/dL   Comment 1 Notify RN    Comment 2 Document in Chart   Blood gas, arterial     Status: Abnormal   Collection Time: 02/22/17  4:25 AM  Result Value Ref Range   FIO2 40.00    Delivery systems VENTILATOR    Mode PRESSURE REGULATED VOLUME CONTROL    VT 500 mL   LHR 20.0 resp/min   Peep/cpap 5.0 cm H20   pH, Arterial 7.298 (L) 7.350 - 7.450   pCO2 arterial 57.3 (H) 32.0 - 48.0 mmHg   pO2, Arterial 211 (H) 83.0 - 108.0 mmHg   Bicarbonate 24.0 20.0 - 28.0 mmol/L   Acid-Base Excess 1.4 0.0 - 2.0 mmol/L   O2 Saturation 98.8 %   Collection site RIGHT RADIAL    Drawn by [id number]    Sample type ARTERIAL DRAW    Allens test (pass/fail) PASS PASS    Comment: Performed at [facility], [address] [place], [place] [address]  Magnesium     Status: None   Collection Time: 02/22/17  5:43 AM  Result Value Ref Range   Magnesium 2.1 1.7 - 2.4 mg/dL    Comment: Performed at [facility], [address] [place], [place] [address]  Phosphorus     Status: None   Collection Time: 02/22/17  5:43 AM  Result Value Ref Range   Phosphorus 2.7 2.5 - 4.6 mg/dL    Comment: Performed at [facility], [address] [place], [place] [address]  Basic metabolic panel     Status: Abnormal   Collection Time: 02/22/17  5:43 AM  Result Value Ref Range   Sodium 138 135 - 145 mmol/L   Potassium 4.4 3.5 - 5.1 mmol/L   Chloride 104 101 - 111 mmol/L   CO2 26 22 - 32 mmol/L   Glucose, Bld 117 (H) 65 - 99 mg/dL   BUN 35 (H) 6 - 20 mg/dL   Creatinine, Ser 0.99 0.44 - 1.00 mg/dL   Calcium 8.6 (L) 8.9 - 10.3 mg/dL   GFR calc non Af Amer 58 (L) >60 mL/min    GFR calc Af Amer >60 >60 mL/min    Comment: (NOTE) The eGFR has been calculated using the CKD EPI equation. This calculation has not been validated in all clinical situations. eGFR's persistently <60 mL/min signify possible Chronic Kidney Disease.    Anion gap 8 5 - 15    Comment: Performed at [facility], [address], [place], [place] [address]  CBC     Status: Abnormal   Collection Time: 02/22/17  5:43 AM  Result Value Ref Range   WBC 8.4 4.0 - 10.5 K/uL   RBC 3.85 (L) 3.87 - 5.11 MIL/uL   Hemoglobin 11.1 (L) 12.0 - 15.0 g/dL   HCT 35.9 (L) 36.0 - 46.0 %   MCV 93.2 78.0 - 100.0 fL   MCH 28.8 26.0 - 34.0 pg   MCHC 30.9 30.0 - 36.0 g/dL   RDW 16.7 (  H) 11.5 - 15.5 %   Platelets 229 150 - 400 K/uL    Comment: Performed at [facility], [address], [place], [place] [address]    ABGS Recent Labs    02/22/17 0425  PHART 7.298*  PO2ART 211*  HCO3 24.0   CULTURES Recent Results (from the past 240 hour(s))  Blood Culture (routine x 2)     Status: None (Preliminary result)   Collection Time: 02/19/17  3:39 PM  Result Value Ref Range Status   Specimen Description LEFT ANTECUBITAL  Final   Special Requests   Final    BOTTLES DRAWN AEROBIC AND ANAEROBIC Blood Culture adequate volume   Culture   Final    NO GROWTH 2 DAYS Performed at [facility], [address], [place], [place] [address]    Report Status PENDING  Incomplete  Blood Culture (routine x 2)     Status: None (Preliminary result)   Collection Time: 02/19/17  3:44 PM  Result Value Ref Range Status   Specimen Description BLOOD RIGHT HAND  Final   Special Requests   Final    BOTTLES DRAWN AEROBIC AND ANAEROBIC Blood Culture adequate volume   Culture   Final    NO GROWTH 2 DAYS Performed at [facility], [address] [place], [place] [address]    Report Status PENDING  Incomplete  MRSA PCR Screening     Status: None   Collection Time: 02/20/17 12:01 AM  Result Value Ref Range Status   MRSA by PCR NEGATIVE  NEGATIVE Final    Comment:        The GeneXpert MRSA Assay (FDA approved for NASAL specimens only), is one component of a comprehensive MRSA colonization surveillance program. It is not intended to diagnose MRSA infection nor to guide or monitor treatment for MRSA infections. Performed at [facility], [address] [place], [place] [address]    Studies/Results: Dg Chest Port 1 View  Result Date: 02/21/2017 CLINICAL DATA:  Hypoxia EXAM: PORTABLE CHEST 1 VIEW COMPARISON:  Chest radiograph February 19, 2017 and chest CT February 20, 2017 FINDINGS: Endotracheal tube tip is 5.0 cm above the carina. Nasogastric tube tip and side port below the diaphragm. No pneumothorax. There is atelectatic change in the left base. There is a minimal right pleural effusion. Lungs elsewhere are clear. There is mild cardiomegaly with pulmonary venous hypertension. No adenopathy. No bone lesions. There is aortic atherosclerosis. IMPRESSION: Tube positions as described without pneumothorax. Pulmonary vascular congestion. Mild left base atelectasis and small right pleural effusion, stable. No new opacity evident. There is aortic atherosclerosis. Aortic Atherosclerosis (ICD10-I70.0). Electronically Signed   By: Lowella Grip III M.D.   On: 02/21/2017 07:50    Medications:  Prior to Admission:  Medications Prior to Admission  Medication Sig Dispense Refill Last Dose  . levothyroxine (SYNTHROID, LEVOTHROID) 125 MCG tablet take 1 tablet by mouth once daily 90 tablet 2 Taking  . warfarin (COUMADIN) 1 MG tablet Take 1 tablet (57m) on Tuesdays and Saturdays along with a 527mtablet (Patient taking differently: Take 1 mg by mouth 2 (two) times a week. Take 1 tablet (41m32mon Tuesdays and Fridays along with a 5mg32mblet) 30 tablet 4 Taking  . warfarin (COUMADIN) 5 MG tablet Take 1 tablet (5 mg total) by mouth daily. 90 tablet 3   . albuterol (PROVENTIL) (2.5 MG/3ML) 0.083% nebulizer solution USE 1 VIAL BY NEBULIZER EVERY  4 HOURS AS NEEDED FOR WHEEZING. DX: J44.9 375 vial 0   .  budesonide (PULMICORT) 0.5 MG/2ML nebulizer solution Take 2 mLs (0.5 mg total) by nebulization 2 (two) times daily. Dx: J43.9 120 mL 3   . fentaNYL (DURAGESIC - DOSED MCG/HR) 50 MCG/HR Place 50 mcg onto the skin every 3 (three) days.   Taking  . fish oil-omega-3 fatty acids 1000 MG capsule Take 2 g by mouth daily.     Taking  . ipratropium-albuterol (DUONEB) 0.5-2.5 (3) MG/3ML SOLN 1 vial via nebulizer at breakfast, lunch, dinner and bedtime daily. May take 2 additional treatments on bad day Dx: J43.9 360 mL 3   . Magnesium 500 MG CAPS Take 500 mg by mouth.   Taking  . nitroGLYCERIN (NITROSTAT) 0.4 MG SL tablet Place 1 tablet (0.4 mg total) under the tongue every 5 (five) minutes as needed for chest pain. 25 tablet 3 Taking  . Respiratory Therapy Supplies (FLUTTER) DEVI Use as directed 1 each 0 Taking  . ticagrelor (BRILINTA) 60 MG TABS tablet Take 1 tablet (60 mg total) by mouth 2 (two) times daily. (Patient not taking: Reported on 01/24/2017) 60 tablet 11 Not Taking   Scheduled: . chlorhexidine gluconate (MEDLINE KIT)  15 mL Mouth Rinse BID  . enoxaparin (LOVENOX) injection  80 mg Subcutaneous Q12H  . insulin aspart  3 Units Subcutaneous Q4H  . ipratropium-albuterol  3 mL Nebulization Q6H  . levothyroxine  62.5 mcg Intravenous Daily  . mouth rinse  15 mL Mouth Rinse 10 times per day  . methylPREDNISolone (SOLU-MEDROL) injection  80 mg Intravenous Q8H  . oseltamivir  75 mg Oral BID   Continuous: . sodium chloride    . azithromycin Stopped (02/22/17 0135)  . famotidine (PEPCID) IV Stopped (02/21/17 2231)  . feeding supplement (VITAL AF 1.2 CAL) 1,000 mL (02/22/17 0003)  . fentaNYL infusion INTRAVENOUS 325 mcg/hr (02/22/17 0526)  . piperacillin-tazobactam (ZOSYN)  IV Stopped (02/22/17 0439)  . vancomycin 1,000 mg (02/22/17 0535)   AJH:HIDUPB chloride, albuterol, midazolam  Assesment: She was admitted with COPD exacerbation and  acute hypoxic and hypercapnic respiratory failure.  I think she probably has some element of chronic hypercapnic respiratory failure but I do not think we know that for sure.  She has influenza A which is being treated.  She is on steroids and antibiotics.  Echocardiogram shows diastolic dysfunction and her chest x-ray shows what may be some vascular congestion so I think it is reasonable to try some Lasix and see if that helps [place] with getting her off the vent. Principal Problem:   COPD exacerbation ([place]) Active Problems:   Tachycardia   Influenza A   Acute respiratory failure ([place])    Plan: Continue with treatments.  Add Lasix    LOS: 3 days   Glynnis Gavel L 02/22/2017, 7:22 AM

## 2017-02-22 NOTE — Progress Notes (Signed)
[facility] PROGRESS NOTE  [name] MRN:[id number] DOB: 12/03/1950 DOA: 02/19/2017 PCP: Nafziger, Cory, NP  Interim summary and HPI [name]  is a 66 y.o. female, w hx of CAD, Hypothyroidism, PE, Factor V leiden, Copd on home o2, apparently c/o increase in dyspnea for 24 hours prior to admission.  Slight cough, dry-yellow sputum.  Denies fever, chills, cp, palp, n/v, diarrhea, brbpr, black stool.  Pt presents to ED due to increase in dyspnea.    CXR demonstrated acute on chronic pulmonary interstitial opacity; CTA neg for PE. Positive influenza A  Assessment/Plan: 1-acute on chronic resp failure with hypoxia and hypercapnia: in the setting of COPD exacerbation and influenza A infection . -patient initially treated with BIPAP, with very little improvement -COs increased over 100 and required ventilatory support -will follow rec's from pulmonary/critical care -antibiotics broaden  -continue steroids, nebulizer treatment and pulmicort  -follow ABG and CXR to assess/guide weaning process, pulmonary made some adjustments to vent rate today  -continue Tamiflu -started tube feeding 2/13  Respiratory acidosis - increased vent rate 2/13.   2-tachycardia -in presentation of acute infection, albuterol treatment and anxiety -CTA neg for PE -TSH WNL -echo: EF 50-55%  3-hypothyroidism -continue synthroid  -half dose while given IV  4-GI prophylaxis -continue famotidine BID  5-Factor V mutation and hx of PE -while intubated will use lovenox per pharmacy -resume coumadin once stable and able to tolerate PO's   Code Status: Full Family Communication: no family at bedside  Disposition Plan: remains in ICU, continue ventilatory support, continue IV antibiotics, steroids and nebulizer.  Consultants:  Pulmonary/critical care service  Procedures:  See below for x-ray reports  Ventilator dependent   2-D echo:    Antibiotics:  Vancomycin, zosyn and zithromax   2/11  HPI/Subjective: Sedated and mechanically ventilated;  Objective: Vitals:   02/22/17 0500 02/22/17 0600  BP: (!) 101/59 (!) 107/53  Pulse: (!) 57 (!) 55  Resp: 20 20  Temp: 98.2 F (36.8 C) 98.2 F (36.8 C)  SpO2: 98% 99%    Intake/Output Summary (Last 24 hours) at 02/22/2017 0750 Last data filed at 02/22/2017 0652 Gross per 24 hour  Intake 3039.01 ml  Output 1650 ml  Net 1389.01 ml   Filed Weights   02/20/17 0500 02/21/17 0500 02/22/17 0401  Weight: 79.8 kg (175 lb 14.8 oz) 80.5 kg (177 lb 7.5 oz) 81.3 kg (179 lb 3.7 oz)    Exam:   General: afebrile, sedated, with ventilatory support; no icterus.  Cardiovascular: S1 and S2, no rubs, no gallops  Respiratory: positive exp wheezing, diminish BS at bases and positive rhonchi.  Abdomen: soft, NT, ND, positive BS  Musculoskeletal: no edema, no cyanosis, no clubbing   Data Reviewed: Basic Metabolic Panel: Recent Labs  Lab 02/19/17 1539 02/20/17 0619 02/21/17 0532 02/22/17 0543  NA 140 138 139 138  K 3.8 4.3 4.2 4.4  CL 98* 102 105 104  CO2 30 28 27 26  GLUCOSE 114* 128* 153* 117*  BUN 15 17 27* 35*  CREATININE 0.94 0.70 0.89 0.99  CALCIUM 8.7* 8.0* 8.5* 8.6*  MG  --   --   --  2.1  PHOS  --   --   --  2.7   Liver Function Tests: Recent Labs  Lab 02/19/17 1539 02/20/17 0619  AST 24 22  ALT 14 15  ALKPHOS 113 88  BILITOT 0.4 0.4  PROT 7.4 6.1*  ALBUMIN 3.3* 2.6*   CBC: Recent Labs  Lab 02/19/17 1539   02/20/17 0619 02/21/17 0532 02/22/17 0543  WBC 8.8 5.5 7.1 8.4  NEUTROABS 4.9  --   --   --   HGB 12.1 11.2* 11.2* 11.1*  HCT 40.2 37.2 37.2 35.9*  MCV 94.1 94.7 94.9 93.2  PLT 219 184 204 229   Cardiac Enzymes: Recent Labs  Lab 02/19/17 1539 02/20/17 0042 02/20/17 0619 02/20/17 1234  TROPONINI <0.03 <0.03 <0.03 <0.03   BNP (last 3 results) Recent Labs    02/19/17 1540  BNP 121.0*    Recent Results (from the past 240 hour(s))  Blood Culture (routine x 2)     Status: None  (Preliminary result)   Collection Time: 02/19/17  3:39 PM  Result Value Ref Range Status   Specimen Description LEFT ANTECUBITAL  Final   Special Requests   Final    BOTTLES DRAWN AEROBIC AND ANAEROBIC Blood Culture adequate volume   Culture   Final    NO GROWTH 3 DAYS Performed at [facility], [address] [place], [place] [address]    Report Status PENDING  Incomplete  Blood Culture (routine x 2)     Status: None (Preliminary result)   Collection Time: 02/19/17  3:44 PM  Result Value Ref Range Status   Specimen Description BLOOD RIGHT HAND  Final   Special Requests   Final    BOTTLES DRAWN AEROBIC AND ANAEROBIC Blood Culture adequate volume   Culture   Final    NO GROWTH 3 DAYS Performed at [facility], [address] [place], [place] [address]    Report Status PENDING  Incomplete  MRSA PCR Screening     Status: None   Collection Time: 02/20/17 12:01 AM  Result Value Ref Range Status   MRSA by PCR NEGATIVE NEGATIVE Final    Comment:        The GeneXpert MRSA Assay (FDA approved for NASAL specimens only), is one component of a comprehensive MRSA colonization surveillance program. It is not intended to diagnose MRSA infection nor to guide or monitor treatment for MRSA infections. Performed at [facility], [address] [place], [place] [address]      Studies: Dg Chest Port 1 View  Result Date: 02/21/2017 CLINICAL DATA:  Hypoxia EXAM: PORTABLE CHEST 1 VIEW COMPARISON:  Chest radiograph February 19, 2017 and chest CT February 20, 2017 FINDINGS: Endotracheal tube tip is 5.0 cm above the carina. Nasogastric tube tip and side port below the diaphragm. No pneumothorax. There is atelectatic change in the left base. There is a minimal right pleural effusion. Lungs elsewhere are clear. There is mild cardiomegaly with pulmonary venous hypertension. No adenopathy. No bone lesions. There is aortic atherosclerosis. IMPRESSION: Tube positions as described without pneumothorax.  Pulmonary vascular congestion. Mild left base atelectasis and small right pleural effusion, stable. No new opacity evident. There is aortic atherosclerosis. Aortic Atherosclerosis (ICD10-I70.0). Electronically Signed   By: William  Woodruff III M.D.   On: 02/21/2017 07:50    Scheduled Meds: . chlorhexidine gluconate (MEDLINE KIT)  15 mL Mouth Rinse BID  . enoxaparin (LOVENOX) injection  80 mg Subcutaneous Q12H  . furosemide  40 mg Intravenous Q12H  . insulin aspart  3 Units Subcutaneous Q4H  . ipratropium-albuterol  3 mL Nebulization Q6H  . levothyroxine  62.5 mcg Intravenous Daily  . mouth rinse  15 mL Mouth Rinse 10 times per day  . methylPREDNISolone (SOLU-MEDROL) injection  80 mg Intravenous Q8H  . oseltamivir  75 mg Oral BID   Continuous Infusions: . sodium chloride    .   azithromycin Stopped (02/22/17 0135)  . famotidine (PEPCID) IV Stopped (02/21/17 2231)  . feeding supplement (VITAL AF 1.2 CAL) 1,000 mL (02/22/17 0003)  . fentaNYL infusion INTRAVENOUS 325 mcg/hr (02/22/17 0526)  . piperacillin-tazobactam (ZOSYN)  IV Stopped (02/22/17 0439)  . vancomycin 1,000 mg (02/22/17 0535)   Principal Problem:   COPD exacerbation ([place]) Active Problems:   Tachycardia   Influenza A   Acute respiratory failure [facility])  Critical Care Time spent: 31 minutes  [facility] Pager [phone]. If 7PM-7AM, please contact night-coverage at [email or web address], password [facility] 02/22/2017, 7:50 AM  LOS: 3 days

## 2017-02-22 NOTE — Progress Notes (Signed)
Pharmacy Antibiotic Note  [name] is a 67 y.o. female admitted on 02/19/2017 with sepsis.  Pharmacy has been consulted for zosyn dosing.  Plan: Continue Zosyn 3.375g IV q8h (4 hour infusion). Also, on azithromycin 500mg  IV q24h F/u cxs and clinical progress Monitor V/S and labs  Height: 5\' 5"  (165.1 cm) Weight: 179 lb 3.7 oz (81.3 kg) IBW/kg (Calculated) : 57  Temp (24hrs), Avg:98 F (36.7 C), Min:97.5 F (36.4 C), Max:98.4 F (36.9 C)  Recent Labs  Lab 02/19/17 1539 02/19/17 1629 02/20/17 0619 02/21/17 0532 02/22/17 0543  WBC 8.8  --  5.5 7.1 8.4  CREATININE 0.94  --  0.70 0.89 0.99  LATICACIDVEN  --  1.1  --   --   --     Estimated Creatinine Clearance: 58.9 mL/min (by C-G formula based on SCr of 0.99 mg/dL).    Allergies  Allergen Reactions  . Dilaudid [Hydromorphone Hcl] Hives and Nausea Only  . Minocycline Hcl     REACTION: Dizzy  . Prednisone     REACTION: feels like throat swelling  . Varenicline Tartrate     REACTION: Dizzy(chantix)   . Zocor [Simvastatin - High Dose] Other (See Comments)    myalgia    Antimicrobials this admission: Zosyn 2/11 >>  Vanc 2/11 >>2/14  Azith 2/11 >> Tamilfu until 2/16  Dose adjustments this admission:  n/a   Microbiology results:  2/11 BCx: ngtd 2/12MRSA PCR (-)  Thank you for allowing pharmacy to be a part of this patient's care.  Isac Sarna, BS [place], [place] Clinical Pharmacist Pager [phone]  02/22/2017 1:38 PM

## 2017-02-22 NOTE — Progress Notes (Addendum)
RN called into room by RT for suspicious seizure activity. Pt's eyes were open and limbs were twitching intermittently. Pt did not follow commands but she is also sedated. Activity lasted approx 2 mins. Versed per order was given and twitching activity stopped. Pt's RN was notified and MD was paged. Will continue to monitor.

## 2017-02-23 ENCOUNTER — Inpatient Hospital Stay (HOSPITAL_COMMUNITY)
Admit: 2017-02-23 | Discharge: 2017-02-23 | Disposition: A | Discharge status: Home / Self Care | Payer: Medicare Other | Attending: Family Medicine | Admitting: Family Medicine

## 2017-02-23 ENCOUNTER — Inpatient Hospital Stay (HOSPITAL_COMMUNITY): Payer: Medicare Other

## 2017-02-23 LAB — CBC
HCT: 37.6 % (ref 36.0–46.0)
Hemoglobin: 11.4 g/dL — ABNORMAL LOW (ref 12.0–15.0)
MCH: 28.2 pg (ref 26.0–34.0)
MCHC: 30.3 g/dL (ref 30.0–36.0)
MCV: 93.1 fL (ref 78.0–100.0)
PLATELETS: 203 10*3/uL (ref 150–400)
RBC: 4.04 MIL/uL (ref 3.87–5.11)
RDW: 16.7 % — AB (ref 11.5–15.5)
WBC: 8.3 10*3/uL (ref 4.0–10.5)

## 2017-02-23 LAB — MAGNESIUM
Magnesium: 1.9 mg/dL (ref 1.7–2.4)
Magnesium: 2 mg/dL (ref 1.7–2.4)

## 2017-02-23 LAB — BASIC METABOLIC PANEL
Anion gap: 10 (ref 5–15)
BUN: 42 mg/dL — AB (ref 6–20)
CHLORIDE: 102 mmol/L (ref 101–111)
CO2: 29 mmol/L (ref 22–32)
CREATININE: 1.11 mg/dL — AB (ref 0.44–1.00)
Calcium: 8.2 mg/dL — ABNORMAL LOW (ref 8.9–10.3)
GFR calc Af Amer: 59 mL/min — ABNORMAL LOW (ref >60)
GFR calc non Af Amer: 51 mL/min — ABNORMAL LOW (ref >60)
Glucose, Bld: 131 mg/dL — ABNORMAL HIGH (ref 65–99)
Potassium: 4 mmol/L (ref 3.5–5.1)
Sodium: 141 mmol/L (ref 135–145)

## 2017-02-23 LAB — BLOOD GAS, ARTERIAL
Acid-Base Excess: 5.9 mmol/L — ABNORMAL HIGH (ref 0.0–2.0)
Bicarbonate: 28.8 mmol/L — ABNORMAL HIGH (ref 20.0–28.0)
Drawn by: 22223
FIO2: 40
LHR: 20 {breaths}/min
MECHVT: 500 mL
O2 SAT: 97.9 %
PCO2 ART: 60 mmHg — AB (ref 32.0–48.0)
PEEP/CPAP: 5 cmH2O
PO2 ART: 118 mmHg — AB (ref 83.0–108.0)
pH, Arterial: 7.339 — ABNORMAL LOW (ref 7.350–7.450)

## 2017-02-23 LAB — GLUCOSE, CAPILLARY
GLUCOSE-CAPILLARY: 162 mg/dL — AB (ref 65–99)
GLUCOSE-CAPILLARY: 106 mg/dL — AB (ref 65–99)
GLUCOSE-CAPILLARY: 139 mg/dL — AB (ref 65–99)
Glucose-Capillary: 129 mg/dL — ABNORMAL HIGH (ref 65–99)
Glucose-Capillary: 135 mg/dL — ABNORMAL HIGH (ref 65–99)
Glucose-Capillary: 153 mg/dL — ABNORMAL HIGH (ref 65–99)

## 2017-02-23 LAB — PHOSPHORUS
Phosphorus: 3.5 mg/dL (ref 2.5–4.6)
Phosphorus: 2.8 mg/dL (ref 2.5–4.6)

## 2017-02-23 MED ORDER — OSELTAMIVIR PHOSPHATE 30 MG PO CAPS
30.0000 mg | ORAL_CAPSULE | Freq: Two times a day (BID) | ORAL | Status: AC
  Administered 2017-02-23 – 2017-02-24 (×2): 30 mg via ORAL
  Filled 2017-02-23 (×2): qty 1

## 2017-02-23 MED ORDER — LORAZEPAM 2 MG/ML IJ SOLN
1.0000 mg | INTRAMUSCULAR | Status: DC | PRN
  Administered 2017-02-23 – 2017-03-01 (×17): 1 mg via INTRAVENOUS
  Filled 2017-02-23 (×17): qty 1

## 2017-02-23 MED ORDER — LORAZEPAM 2 MG/ML IJ SOLN: 1.0000 mg | INTRAMUSCULAR | Status: DC | PRN

## 2017-02-23 MED ORDER — FENTANYL CITRATE (PF) 2500 MCG/50ML IJ SOLN
INTRAMUSCULAR | Status: AC
  Filled 2017-02-23: qty 50

## 2017-02-23 NOTE — Progress Notes (Addendum)
[facility] PROGRESS NOTE  Nikya Busler NLG:[id number] DOB: February 22, 1950 DOA: 02/19/2017 PCP: [name], NP  Interim summary and HPI [name]  is a 67 y.o. female, w hx of CAD, Hypothyroidism, PE, Factor V leiden, Copd on home o2, apparently c/o increase in dyspnea for 24 hours prior to admission.  Slight cough, dry-yellow sputum.  Denies fever, chills, cp, palp, n/v, diarrhea, brbpr, black stool.  Pt presents to ED due to increase in dyspnea.    CXR demonstrated acute on chronic pulmonary interstitial opacity; CTA neg for PE. Positive influenza A  Assessment/Plan: 1-acute on chronic resp failure with hypoxia and hypercapnia: in the setting of COPD exacerbation and influenza A infection . -patient initially treated with BIPAP, with very little improvement -COs increased over 100 and required ventilatory support -will follow rec's from pulmonary/critical care -antibiotics broaden  -continue steroids, nebulizer treatment and pulmicort  -follow ABG and CXR to assess/guide weaning process, pulmonary made some adjustments to vent rate today  -continue Tamiflu -started tube feeding 2/13  2-tachycardia -in presentation of acute infection, albuterol treatment and anxiety -CTA neg for PE -TSH WNL -echo: EF 50-55%  3-hypothyroidism -continue synthroid  -half dose while given IV  4-GI prophylaxis -continue famotidine BID  5-Factor V mutation and hx of PE -while intubated will use lovenox per pharmacy -resume coumadin once stable and able to tolerate PO's  Acute Pulmonary Edema - Pt received lasix IV yesterday and has diuresed.   Repeat CXR in AM.   Tremors / ?seizure activity - will obtain EEG, consult neurology.   Code Status: Full Family Communication: no family at bedside  Disposition Plan: remains in ICU  Consultants:  Pulmonary/critical care service  Procedures:  See below for x-ray reports  Ventilator dependent   2-D echo:     Antibiotics:  Vancomycin, zosyn and zithromax  2/11  HPI/Subjective: Sedated and mechanically ventilated;  Objective: Vitals:   02/23/17 0300 02/23/17 0542  BP: (!) 94/52   Pulse: (!) 57   Resp: 20   Temp: 99 F (37.2 C) 97.8 F (36.6 C)  SpO2: 99%     Intake/Output Summary (Last 24 hours) at 02/23/2017 0745 Last data filed at 02/23/2017 0500 Gross per 24 hour  Intake 450 ml  Output 3300 ml  Net -2850 ml   Filed Weights   02/21/17 0500 02/22/17 0401 02/23/17 0500  Weight: 80.5 kg (177 lb 7.5 oz) 81.3 kg (179 lb 3.7 oz) 82 kg (180 lb 12.4 oz)    Exam:   General: afebrile, sedated, with ventilatory support; no icterus.  Cardiovascular: S1 and S2, no rubs, no gallops  Respiratory: positive exp wheezing, diminish BS at bases and positive rhonchi.  Abdomen: soft, NT, ND, positive BS  Musculoskeletal: no edema, no cyanosis, no clubbing   Data Reviewed: Basic Metabolic Panel: Recent Labs  Lab 02/19/17 1539 02/20/17 0619 02/21/17 0532 02/22/17 0543 02/22/17 1658 02/23/17 0516  NA 140 138 139 138  --  141  K 3.8 4.3 4.2 4.4  --  4.0  CL 98* 102 105 104  --  102  CO2 _0 --  29  GLUCOSE 114* 128* 153* 117*  --  131*  BUN 15 17 27* 35*  --  42*  CREATININE 0.94 0.70 0.89 0.99  --  1.11*  CALCIUM 8.7* 8.0* 8.5* 8.6*  --  8.2*  MG  --   --   --  2.1 1.9 1.9  PHOS  --   --   --  2.7 3.0 3.5   Liver Function Tests: Recent Labs  Lab 02/19/17 1539 02/20/17 0619  AST 24 22  ALT 14 15  ALKPHOS 113 88  BILITOT 0.4 0.4  PROT 7.4 6.1*  ALBUMIN 3.3* 2.6*   CBC: Recent Labs  Lab 02/19/17 1539 02/20/17 0619 02/21/17 0532 02/22/17 0543 02/23/17 0516  WBC 8.8 5.5 7.1 8.4 8.3  NEUTROABS 4.9  --   --   --   --   HGB 12.1 11.2* 11.2* 11.1* 11.4*  HCT 40.2 37.2 37.2 35.9* 37.6  MCV 94.1 94.7 94.9 93.2 93.1  PLT 219 184 204 229 203   Cardiac Enzymes: Recent Labs  Lab 02/19/17 1539 02/20/17 0042 02/20/17 0619 02/20/17 1234  TROPONINI <0.03  <0.03 <0.03 <0.03   BNP (last 3 results) Recent Labs    02/19/17 1540  BNP 121.0*    Recent Results (from the past 240 hour(s))  Blood Culture (routine x 2)     Status: None (Preliminary result)   Collection Time: 02/19/17  3:39 PM  Result Value Ref Range Status   Specimen Description LEFT ANTECUBITAL  Final   Special Requests   Final    BOTTLES DRAWN AEROBIC AND ANAEROBIC Blood Culture adequate volume   Culture   Final    NO GROWTH 4 DAYS Performed at [facility], [address], [place], [place] [address]    Report Status PENDING  Incomplete  Blood Culture (routine x 2)     Status: None (Preliminary result)   Collection Time: 02/19/17  3:44 PM  Result Value Ref Range Status   Specimen Description BLOOD RIGHT HAND  Final   Special Requests   Final    BOTTLES DRAWN AEROBIC AND ANAEROBIC Blood Culture adequate volume   Culture   Final    NO GROWTH 4 DAYS Performed at [facility], [address] [place], [place] [address]    Report Status PENDING  Incomplete  MRSA PCR Screening     Status: None   Collection Time: 02/20/17 12:01 AM  Result Value Ref Range Status   MRSA by PCR NEGATIVE NEGATIVE Final    Comment:        The GeneXpert MRSA Assay (FDA approved for NASAL specimens only), is one component of a comprehensive MRSA colonization surveillance program. It is not intended to diagnose MRSA infection nor to guide or monitor treatment for MRSA infections. Performed at [facility], [address] [place], [place] [address]      Studies: Dg Chest [facility] 1 View  Result Date: 02/23/2017 CLINICAL DATA:  Respiratory failure. EXAM: PORTABLE CHEST 1 VIEW COMPARISON:  02/22/2017.  02/21/2017.  CT 02/20/2017. FINDINGS: Endotracheal tube and NG tube in stable position. Stable mild cardiomegaly. Very mild bilateral interstitial prominence again noted. These changes may be related to very mild interstitial edema and/or pneumonitis. Small bilateral pleural effusions appear to be  present on today's exam. No pneumothorax. IMPRESSION: 1.  Endotracheal tube and NG tube in stable position. 2. Stable mild cardiomegaly. Stable mild bilateral interstitial prominence and small bilateral pleural effusions. Findings suggest very mild changes of CHF. Pneumonitis cannot be excluded. Electronically Signed   By: Marcello Moores  Register   On: 02/23/2017 07:22   Dg Chest Port 1 View  Result Date: 02/22/2017 CLINICAL DATA:  Respiratory failure. EXAM: PORTABLE CHEST 1 VIEW COMPARISON:  02/21/2017. FINDINGS: Endotracheal tube and NG tube in stable position. Cardiomegaly again noted. Mild bilateral interstitial prominence noted. Findings suggest mild interstitial edema. Pneumonitis cannot be excluded. Small bilateral  pleural effusions cannot be excluded. No pneumothorax. IMPRESSION: 1.  Endotracheal tube and NG tube in stable position. 2. Cardiomegaly with mild pulmonary vascular prominence and bilateral interstitial prominence suggesting mild CHF. Small bilateral pleural effusions cannot be excluded. Electronically Signed   By: Marcello Moores  Register   On: 02/22/2017 05:51    Scheduled Meds: . chlorhexidine gluconate (MEDLINE KIT)  15 mL Mouth Rinse BID  . enoxaparin (LOVENOX) injection  80 mg Subcutaneous Q12H  . insulin aspart  3 Units Subcutaneous Q4H  . ipratropium-albuterol  3 mL Nebulization Q6H  . levothyroxine  62.5 mcg Intravenous Daily  . mouth rinse  15 mL Mouth Rinse 10 times per day  . methylPREDNISolone (SOLU-MEDROL) injection  80 mg Intravenous Q8H  . oseltamivir  75 mg Oral BID   Continuous Infusions: . sodium chloride    . azithromycin Stopped (02/23/17 0146)  . famotidine (PEPCID) IV Stopped (02/22/17 2220)  . feeding supplement (VITAL AF 1.2 CAL) 1,000 mL (02/22/17 2201)  . fentaNYL infusion INTRAVENOUS 350 mcg/hr (02/23/17 0537)  . piperacillin-tazobactam (ZOSYN)  IV Stopped (02/23/17 0736)   Principal Problem:   COPD exacerbation ([place]) Active Problems:   Tachycardia    Influenza A   Acute respiratory failure [facility])  Critical Care Time spent: 33 minutes  [facility] Pager [phone]. If 7PM-7AM, please contact night-coverage at [email or web address], password [facility] 02/23/2017, 7:45 AM  LOS: 4 days

## 2017-02-23 NOTE — Progress Notes (Signed)
Subjective: She was admitted to the hospital with influenza and acute on chronic hypoxic and hypercapnic respiratory failure.  Possible seizure activity noted.  No other new problems reported  Objective: Vital signs in last 24 hours: Temp:  [97.8 F (36.6 C)-99.1 F (37.3 C)] 97.8 F (36.6 C) (02/15 0542) Pulse Rate:  [55-72] 57 (02/15 0300) Resp:  [19-20] 20 (02/15 0300) BP: (92-105)/(47-56) 94/52 (02/15 0300) SpO2:  [97 %-99 %] 99 % (02/15 0300) FiO2 (%):  [40 %] 40 % (02/15 0441) Weight:  [82 kg (180 lb 12.4 oz)] 82 kg (180 lb 12.4 oz) (02/15 0500) Weight change: 0.7 kg (1 lb 8.7 oz) Last BM Date: (UTA)  Intake/Output from previous day: 02/14 0701 - 02/15 0700 In: 450 [IV Piggyback:450] Out: 3300 [Urine:3300]  PHYSICAL EXAM General appearance: Intubated sedated on mechanical ventilation Resp: rhonchi bilaterally Cardio: regular rate and rhythm, S1, S2 normal, no murmur, click, rub or gallop GI: soft, non-tender; bowel sounds normal; no masses,  no organomegaly Extremities: extremities normal, atraumatic, no cyanosis or edema Skin warm and dry  Lab Results:  Results for orders placed or performed during the hospital encounter of 02/19/17 (from the past 48 hour(s))  Blood gas, arterial     Status: Abnormal   Collection Time: 02/21/17 11:03 AM  Result Value Ref Range   FIO2 0.40    Delivery systems VENTILATOR    Mode PRESSURE REGULATED VOLUME CONTROL    VT 500 mL   LHR 20 resp/min   Peep/cpap 5.0 cm H20   pH, Arterial 7.299 (L) 7.350 - 7.450   pCO2 arterial 57.3 (H) 32.0 - 48.0 mmHg   pO2, Arterial 80.7 (L) 83.0 - 108.0 mmHg   Bicarbonate 24.8 20.0 - 28.0 mmol/L   Acid-Base Excess 1.5 0.0 - 2.0 mmol/L   O2 Saturation 95.2 %   Collection site RIGHT RADIAL    Drawn by [phone]    Sample type ARTERIAL    [place]ens test (pass/fail) PASS PASS    Comment: Performed at [facility], [address], [place], [place] [address]  Glucose, capillary     Status: Abnormal    Collection Time: 02/21/17  1:44 PM  Result Value Ref Range   Glucose-Capillary 110 (H) 65 - 99 mg/dL  Glucose, capillary     Status: Abnormal   Collection Time: 02/21/17  4:29 PM  Result Value Ref Range   Glucose-Capillary 118 (H) 65 - 99 mg/dL  Glucose, capillary     Status: Abnormal   Collection Time: 02/21/17  7:48 PM  Result Value Ref Range   Glucose-Capillary 121 (H) 65 - 99 mg/dL   Comment 1 Notify RN    Comment 2 Document in Chart   Glucose, capillary     Status: Abnormal   Collection Time: 02/21/17 11:40 PM  Result Value Ref Range   Glucose-Capillary 115 (H) 65 - 99 mg/dL   Comment 1 Notify RN    Comment 2 Document in Chart   Glucose, capillary     Status: Abnormal   Collection Time: 02/22/17  4:04 AM  Result Value Ref Range   Glucose-Capillary 103 (H) 65 - 99 mg/dL   Comment 1 Notify RN    Comment 2 Document in Chart   Blood gas, arterial     Status: Abnormal   Collection Time: 02/22/17  4:25 AM  Result Value Ref Range   FIO2 40.00    Delivery systems VENTILATOR    Mode PRESSURE REGULATED VOLUME CONTROL    VT 500  mL   LHR 20.0 resp/min   Peep/cpap 5.0 cm H20   pH, Arterial 7.298 (L) 7.350 - 7.450   pCO2 arterial 57.3 (H) 32.0 - 48.0 mmHg   pO2, Arterial 211 (H) 83.0 - 108.0 mmHg   Bicarbonate 24.0 20.0 - 28.0 mmol/L   Acid-Base Excess 1.4 0.0 - 2.0 mmol/L   O2 Saturation 98.8 %   Collection site RIGHT RADIAL    Drawn by [id number]    Sample type ARTERIAL DRAW    [place] test (pass/fail) PASS PASS    Comment: Performed at [facility], [address], [place], [place] [address]  Magnesium     Status: None   Collection Time: 02/22/17  5:43 AM  Result Value Ref Range   Magnesium 2.1 1.7 - 2.4 mg/dL    Comment: Performed at [facility], [address], [place], [place] [address]  Phosphorus     Status: None   Collection Time: 02/22/17  5:43 AM  Result Value Ref Range   Phosphorus 2.7 2.5 - 4.6 mg/dL    Comment: Performed at [facility], [address],  [place], [place] [address]  Basic metabolic panel     Status: Abnormal   Collection Time: 02/22/17  5:43 AM  Result Value Ref Range   Sodium 138 135 - 145 mmol/L   Potassium 4.4 3.5 - 5.1 mmol/L   Chloride 104 101 - 111 mmol/L   CO2 26 22 - 32 mmol/L   Glucose, Bld 117 (H) 65 - 99 mg/dL   BUN 35 (H) 6 - 20 mg/dL   Creatinine, Ser 0.99 0.44 - 1.00 mg/dL   Calcium 8.6 (L) 8.9 - 10.3 mg/dL   GFR calc non Af Amer 58 (L) >60 mL/min   GFR calc Af Amer >60 >60 mL/min    Comment: (NOTE) The eGFR has been calculated using the CKD EPI equation. This calculation has not been validated in all clinical situations. eGFR's persistently <60 mL/min signify possible Chronic Kidney Disease.    Anion gap 8 5 - 15    Comment: Performed at [facility], [address] [place], [place] [address]  CBC     Status: Abnormal   Collection Time: 02/22/17  5:43 AM  Result Value Ref Range   WBC 8.4 4.0 - 10.5 K/uL   RBC 3.85 (L) 3.87 - 5.11 MIL/uL   Hemoglobin 11.1 (L) 12.0 - 15.0 g/dL   HCT 35.9 (L) 36.0 - 46.0 %   MCV 93.2 78.0 - 100.0 fL   MCH 28.8 26.0 - 34.0 pg   MCHC 30.9 30.0 - 36.0 g/dL   RDW 16.7 (H) 11.5 - 15.5 %   Platelets 229 150 - 400 K/uL    Comment: Performed at [facility], [address], [place], [place] [address]  Glucose, capillary     Status: Abnormal   Collection Time: 02/22/17  8:01 AM  Result Value Ref Range   Glucose-Capillary 104 (H) 65 - 99 mg/dL  Glucose, capillary     Status: Abnormal   Collection Time: 02/22/17 11:40 AM  Result Value Ref Range   Glucose-Capillary 120 (H) 65 - 99 mg/dL  Magnesium     Status: None   Collection Time: 02/22/17  4:58 PM  Result Value Ref Range   Magnesium 1.9 1.7 - 2.4 mg/dL    Comment: Performed at [facility], [address], [place], [place] [address]  Phosphorus     Status: None   Collection Time: 02/22/17  4:58 PM  Result  Value Ref Range   Phosphorus 3.0 2.5 - 4.6 mg/dL    Comment: Performed at [facility], [address]  [place], [place] [address]  Glucose, capillary     Status: Abnormal   Collection Time: 02/22/17  5:13 PM  Result Value Ref Range   Glucose-Capillary 110 (H) 65 - 99 mg/dL  Glucose, capillary     Status: Abnormal   Collection Time: 02/22/17  8:24 PM  Result Value Ref Range   Glucose-Capillary 139 (H) 65 - 99 mg/dL  Glucose, capillary     Status: Abnormal   Collection Time: 02/23/17 12:07 AM  Result Value Ref Range   Glucose-Capillary 153 (H) 65 - 99 mg/dL  Blood gas, arterial     Status: Abnormal   Collection Time: 02/23/17  5:10 AM  Result Value Ref Range   FIO2 40.00    Delivery systems VENTILATOR    Mode PRESSURE REGULATED VOLUME CONTROL    VT 500 mL   LHR 20 resp/min   Peep/cpap 5.0 cm H20   pH, Arterial 7.339 (L) 7.350 - 7.450   pCO2 arterial 60.00 (H) 32.0 - 48.0 mmHg   pO2, Arterial 118.0 (H) 83.0 - 108.0 mmHg   Bicarbonate 28.8 (H) 20.0 - 28.0 mmol/L   Acid-Base Excess 5.9 (H) 0.0 - 2.0 mmol/L   O2 Saturation 97.9 %   Collection site RIGHT RADIAL    Drawn by [id number]    Sample type ARTERIAL    Allens test (pass/fail) PASS PASS    Comment: Performed at [facility], [address] [place], [place] [address]  Magnesium     Status: None   Collection Time: 02/23/17  5:16 AM  Result Value Ref Range   Magnesium 1.9 1.7 - 2.4 mg/dL    Comment: Performed at [facility], [address] [place], [place] [address]  Phosphorus     Status: None   Collection Time: 02/23/17  5:16 AM  Result Value Ref Range   Phosphorus 3.5 2.5 - 4.6 mg/dL    Comment: Performed at [facility], [address] [place], [place] [address]  Basic metabolic panel     Status: Abnormal   Collection Time: 02/23/17  5:16 AM  Result Value Ref Range   Sodium 141 135 - 145 mmol/L   Potassium 4.0 3.5 - 5.1 mmol/L   Chloride 102 101 - 111 mmol/L   CO2 29 22 - 32 mmol/L   Glucose, Bld 131 (H) 65 - 99 mg/dL   BUN 42 (H) 6 - 20 mg/dL   Creatinine, Ser 1.11 (H) 0.44 - 1.00 mg/dL   Calcium 8.2 (L) 8.9 - 10.3 mg/dL    GFR calc non Af Amer 51 (L) >60 mL/min   GFR calc Af Amer 59 (L) >60 mL/min    Comment: (NOTE) The eGFR has been calculated using the CKD EPI equation. This calculation has not been validated in all clinical situations. eGFR's persistently <60 mL/min signify possible Chronic Kidney Disease.    Anion gap 10 5 - 15    Comment: Performed at [facility], [address] [place], [place] [address]  CBC     Status: Abnormal   Collection Time: 02/23/17  5:16 AM  Result Value Ref Range   WBC 8.3 4.0 - 10.5 K/uL   RBC 4.04 3.87 - 5.11 MIL/uL   Hemoglobin 11.4 (L) 12.0 - 15.0 g/dL   HCT 37.6 36.0 - 46.0 %   MCV 93.1 78.0 - 100.0 fL   MCH 28.2 26.0 -  34.0 pg   MCHC 30.3 30.0 - 36.0 g/dL   RDW 16.7 (H) 11.5 - 15.5 %   Platelets 203 150 - 400 K/uL    Comment: Performed at [facility], [address], [place], [place] [address]  Glucose, capillary     Status: Abnormal   Collection Time: 02/23/17  5:40 AM  Result Value Ref Range   Glucose-Capillary 135 (H) 65 - 99 mg/dL    ABGS Recent Labs    02/23/17 0510  PHART 7.339*  PO2ART 118.0*  HCO3 28.8*   CULTURES Recent Results (from the past 240 hour(s))  Blood Culture (routine x 2)     Status: None (Preliminary result)   Collection Time: 02/19/17  3:39 PM  Result Value Ref Range Status   Specimen Description LEFT ANTECUBITAL  Final   Special Requests   Final    BOTTLES DRAWN AEROBIC AND ANAEROBIC Blood Culture adequate volume   Culture   Final    NO GROWTH 4 DAYS Performed at [facility], [address], [place], [place] [address]    Report Status PENDING  Incomplete  Blood Culture (routine x 2)     Status: None (Preliminary result)   Collection Time: 02/19/17  3:44 PM  Result Value Ref Range Status   Specimen Description BLOOD RIGHT HAND  Final   Special Requests   Final    BOTTLES DRAWN AEROBIC AND ANAEROBIC Blood Culture adequate volume   Culture   Final    NO GROWTH 4 DAYS Performed at [facility], [address] [place] [address],  [place], [place] [address]    Report Status PENDING  Incomplete  MRSA PCR Screening     Status: None   Collection Time: 02/20/17 12:01 AM  Result Value Ref Range Status   MRSA by PCR NEGATIVE NEGATIVE Final    Comment:        The GeneXpert MRSA Assay (FDA approved for NASAL specimens only), is one component of a comprehensive MRSA colonization surveillance program. It is not intended to diagnose MRSA infection nor to guide or monitor treatment for MRSA infections. Performed at [facility], [address], [place],  [address]    Studies/Results: Dg Chest Port 1 View  Result Date: 02/22/2017 CLINICAL DATA:  Respiratory failure. EXAM: PORTABLE CHEST 1 VIEW COMPARISON:  02/21/2017. FINDINGS: Endotracheal tube and NG tube in stable position. Cardiomegaly again noted. Mild bilateral interstitial prominence noted. Findings suggest mild interstitial edema. Pneumonitis cannot be excluded. Small bilateral pleural effusions cannot be excluded. No pneumothorax. IMPRESSION: 1.  Endotracheal tube and NG tube in stable position. 2. Cardiomegaly with mild pulmonary vascular prominence and bilateral interstitial prominence suggesting mild CHF. Small bilateral pleural effusions cannot be excluded. Electronically Signed   By: Marcello Moores  Register   On: 02/22/2017 05:51    Medications:  Prior to Admission:  Medications Prior to Admission  Medication Sig Dispense Refill Last Dose  . levothyroxine (SYNTHROID, LEVOTHROID) 125 MCG tablet take 1 tablet by mouth once daily 90 tablet 2 unknown  . warfarin (COUMADIN) 1 MG tablet Take 1 tablet (12m) on Tuesdays and Saturdays along with a 552mtablet (Patient taking differently: Take 1 mg by mouth 2 (two) times a week. Take 1 tablet (4m69mon Tuesdays and Fridays along with a 5mg54mblet) 30 tablet 4 unknown  . warfarin (COUMADIN) 5 MG tablet Take 1 tablet (5 mg total) by mouth daily. 90 tablet 3 unknown  . albuterol (PROVENTIL) (2.5 MG/3ML) 0.083% nebulizer solution  USE 1 VIAL BY NEBULIZER EVERY  4 HOURS AS NEEDED FOR WHEEZING. DX: J44.9 375 vial 0   . budesonide (PULMICORT) 0.5 MG/2ML nebulizer solution Take 2 mLs (0.5 mg total) by nebulization 2 (two) times daily. Dx: J43.9 120 mL 3   . fentaNYL (DURAGESIC - DOSED MCG/HR) 50 MCG/HR Place 50 mcg onto the skin every 3 (three) days.   Taking  . fish oil-omega-3 fatty acids 1000 MG capsule Take 2 g by mouth daily.     Taking  . ipratropium-albuterol (DUONEB) 0.5-2.5 (3) MG/3ML SOLN 1 vial via nebulizer at breakfast, lunch, dinner and bedtime daily. May take 2 additional treatments on bad day Dx: J43.9 360 mL 3   . Magnesium 500 MG CAPS Take 500 mg by mouth.   Taking  . nitroGLYCERIN (NITROSTAT) 0.4 MG SL tablet Place 1 tablet (0.4 mg total) under the tongue every 5 (five) minutes as needed for chest pain. 25 tablet 3 Taking  . Respiratory Therapy Supplies (FLUTTER) DEVI Use as directed 1 each 0 Taking  . ticagrelor (BRILINTA) 60 MG TABS tablet Take 1 tablet (60 mg total) by mouth 2 (two) times daily. (Patient not taking: Reported on 01/24/2017) 60 tablet 11 Not Taking   Scheduled: . chlorhexidine gluconate (MEDLINE KIT)  15 mL Mouth Rinse BID  . enoxaparin (LOVENOX) injection  80 mg Subcutaneous Q12H  . insulin aspart  3 Units Subcutaneous Q4H  . ipratropium-albuterol  3 mL Nebulization Q6H  . levothyroxine  62.5 mcg Intravenous Daily  . mouth rinse  15 mL Mouth Rinse 10 times per day  . methylPREDNISolone (SOLU-MEDROL) injection  80 mg Intravenous Q8H  . oseltamivir  75 mg Oral BID   Continuous: . sodium chloride    . azithromycin Stopped (02/23/17 0146)  . famotidine (PEPCID) IV Stopped (02/22/17 2220)  . feeding supplement (VITAL AF 1.2 CAL) 1,000 mL (02/22/17 2201)  . fentaNYL infusion INTRAVENOUS 350 mcg/hr (02/23/17 0537)  . piperacillin-tazobactam (ZOSYN)  IV 3.375 g (02/23/17 0536)   PPJ:KDTOIZ chloride, albuterol, midazolam  Assesment: She was admitted with influenza A COPD exacerbation and  acute hypoxic and hypercapnic respiratory failure.  I suspect she has some element of chronic respiratory failure as well.  Blood gas looks better this morning  She has what looks like diastolic heart failure on echocardiogram and she received Lasix yesterday.  She sounds a little better today.  Possible seizure activity was noted  She is being treated for influenza as well as with broad-spectrum antibiotics Principal Problem:   COPD exacerbation ([place]) Active Problems:   Tachycardia   Influenza A   Acute respiratory failure ([place])    Plan: She is at a point point that she perhaps could wean to extubation today.    LOS: 4 days   Tyshon Fanning L 02/23/2017, 7:02 AM

## 2017-02-23 NOTE — Procedures (Signed)
Kelford A. Merlene Laughter, MD     [email or web address]           HISTORY: This a 67 year old who presents with episode of shaking worrisome for convulsive seizures. There is also history of encephalopathy.  MEDICATIONS: Scheduled Meds: . chlorhexidine gluconate (MEDLINE KIT)  15 mL Mouth Rinse BID  . enoxaparin (LOVENOX) injection  80 mg Subcutaneous Q12H  . insulin aspart  3 Units Subcutaneous Q4H  . ipratropium-albuterol  3 mL Nebulization Q6H  . levothyroxine  62.5 mcg Intravenous Daily  . mouth rinse  15 mL Mouth Rinse 10 times per day  . methylPREDNISolone (SOLU-MEDROL) injection  80 mg Intravenous Q8H  . oseltamivir  30 mg Oral BID   Continuous Infusions: . sodium chloride    . azithromycin Stopped (02/23/17 0146)  . famotidine (PEPCID) IV Stopped (02/23/17 1130)  . feeding supplement (VITAL AF 1.2 CAL) 1,000 mL (02/22/17 2201)  . fentaNYL infusion INTRAVENOUS 350 mcg/hr (02/23/17 0919)  . piperacillin-tazobactam (ZOSYN)  IV 3.375 g (02/23/17 1439)   PRN Meds:.sodium chloride, albuterol, LORazepam  Prior to Admission medications   Medication Sig Start Date End Date Taking? Authorizing Provider  levothyroxine (SYNTHROID, LEVOTHROID) 125 MCG tablet take 1 tablet by mouth once daily 11/24/16  Yes Nafziger, Tommi Rumps, NP  warfarin (COUMADIN) 1 MG tablet Take 1 tablet (85m) on Tuesdays and Saturdays along with a 514mtablet Patient taking differently: Take 1 mg by mouth 2 (two) times a week. Take 1 tablet (52m552mon Tuesdays and Fridays along with a 5mg80mblet 01/22/17  Yes [facility]  warfarin (COUMADIN) 5 MG tablet Take 1 tablet (5 mg total) by mouth daily. 01/31/17  Yes [facility]  albuterol (PROVENTIL) (2.5 MG/3ML) 0.083% nebulizer solution USE 1 VIAL BY NEBULIZER EVERY 4 HOURS AS NEEDED FOR WHEEZING. DX: J44.9 01/24/17   [facility]  budesonide (PULMICORT) 0.5 MG/2ML nebulizer solution Take 2 mLs (0.5 mg total) by nebulization 2  (two) times daily. Dx: J43.9 01/24/17   [facility]  fentaNYL (DURAGESIC - DOSED MCG/HR) 50 MCG/HR Place 50 mcg onto the skin every 3 (three) days.    [provider]  fish oil-omega-3 fatty acids 1000 MG capsule Take 2 g by mouth daily.      [provider]  ipratropium-albuterol (DUONEB) 0.5-2.5 (3) MG/3ML SOLN 1 vial via nebulizer at breakfast, lunch, dinner and bedtime daily. May take 2 additional treatments on bad day Dx: J43.9 01/24/17   [facility]  Magnesium 500 MG CAPS Take 500 mg by mouth.    [provider]  nitroGLYCERIN (NITROSTAT) 0.4 MG SL tablet Place 1 tablet (0.4 mg total) under the tongue every 5 (five) minutes as needed for chest pain. 10/28/14   [facility]  Respiratory Therapy Supplies (FLUTTER) DEVI Use as directed 05/14/15   [facility]  ticagrelor (BRILINTA) 60 MG TABS tablet Take 1 tablet (60 mg total) by mouth 2 (two) times daily. Patient not taking: Reported on 01/24/2017 10/28/14   [facility]      ANALYSIS: A 16 channel recording using standard 10 20 measurements is conducted for 21 minutes. The background activity gases high as 4 Hz but generally is in the 2-4 Hz activity throughout the recording. Some frontal beta activity observed at times. Photic stimulation and hyperventilation could not be carried out. There is no focal or lateral slowing. There is no epileptiform activities observed.   IMPRESSION: 1.  This recording is abnormal showing severe global slowing indicating a severe global encephalopathy. However, no epileptiform activities are observed.      Estrellita Lasky A. Merlene Laughter, M.D.  Diplomate, Tax adviser of Psychiatry and Neurology ( Neurology).

## 2017-02-23 NOTE — Progress Notes (Signed)
Offsite EEG completed at [facility].  Results pending. 

## 2017-02-23 NOTE — Progress Notes (Signed)
RT attempted to wean patient at 0900hr CPAP/PS 5/5 40%. RT discontinued wean due to increase in HR 150 and BP 126/100.Patient was placed back on rest mode, previous settings and RN notified of changes, RT will continue to monitor

## 2017-02-23 NOTE — Consult Note (Signed)
[name] A. Merlene Laughter, MD     [email or web address]          [name] is an 67 y.o. female.   ASSESSMENT/PLAN: 1. Myoclonic jerks likely due to multiple metabolic disturbances including hypercapnia and hypoxia. This should get better with with time although there is likely to be a delay even once the hypoxia and hypercapnia has improved. 2. Multifactorial encephalopathy including sedation from psychotropic medication, hypoxia and hypercapnia:  Patient is 67 year old white female who presents with significant dyspnea. She is noted to have reduced level of arousal and jerking were several possible seizures. Patient has been intubated and placed on sedation for her condition. She has been on sedation with the resolution of the jerking activity. The description by the nurse is consistent with chronic jerking movement of the entire body most likely myoclonus. The activity increases when sedation is held. Review of systems not available because of encephalopathy and intubation.   GENERAL: She is intubated and on light sedation at this time.  HEENT: Supple  ABDOMEN: soft  EXTREMITIES: Significant edema of the upper extremities bilaterally.   BACK: Normal  SKIN: Normal by inspection.    MENTAL STATUS: She owns her eyes to verbal commands. She does follow midline commands and sometimes appendicular commands on the right side.  CRANIAL NERVES: Pupils are round and reactive. Extraocular movements are full. Facial muscle strength is symmetric. She does have a gag reflex.  MOTOR: She grimaces to painful stimuli but movements are quite limited graded as 1-2.  COORDINATION: No tremors, myoclonus or parkinsonism is appreciated at this time.  REFLEXES: Deep tendon reflexes are symmetrical and normal.   SENSATION: She responds to painful some light bilaterally.     Blood pressure (!) 126/100, pulse 73, temperature 98.4 F (36.9 C), resp. rate 20, height '5\' 5"'$  (1.651  m), weight 180 lb 12.4 oz (82 kg), SpO2 99 %.  Past Medical History:  Diagnosis Date  . Acute MI ([place])    x4, code blue x3  . Arteriosclerotic cardiovascular disease (ASCVD) 2002   Inf STEMI-2002. 2003-cutting balloon + brachytherapy for restenosis; subsequent acute stent thrombosis 06/2010 requiring 2 separate interventions (Zeta stent, then repeat cath with thrombectomy). focal basal inf AK, nl EF; 03/2011: Patent stents, minor nonobst  residual dz, nl EF; neg stress nuclear in 2008 and stress echo in 2009  . Chronic anticoagulation    Warfarin plus ticagrelor  . Chronic respiratory failure ([place]) 08/27/2013   On 2L 02  . 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 Factor V Leiden, prothrombin gene mutation ([place]) 2006  . Hyperlipidemia   . Hypothyroidism   . Noncompliance   . Pelvic fracture ([place]) 2009  . Pulmonary embolism ([place]) 2006   Associated with deep vein thrombosis-2006; + factor V Leiden  . Tobacco abuse    50 pack years    Past Surgical History:  Procedure Laterality Date  . COLONOSCOPY  Approximately 2000   Negative screening study  . CORONARY ANGIOPLASTY  2002, 2003, 2012  . LEFT AND RIGHT HEART CATHETERIZATION WITH CORONARY ANGIOGRAM N/A 04/03/2011   Procedure: LEFT AND RIGHT HEART CATHETERIZATION WITH CORONARY ANGIOGRAM;  Surgeon: Sherren Mocha, MD;  Location: [facility] CATH LAB;  Service: Cardiovascular;  Laterality: N/A;    Family History  Problem Relation Age of Onset  . Emphysema Mother   . Alzheimer's disease Mother   . Emphysema Sister   . Heart disease Father   . Factor V Leiden  deficiency Father   . Factor V Leiden deficiency Sister   . Factor V Leiden deficiency Daughter   . Kidney cancer Paternal Grandmother     Social History:  reports that she has been smoking cigarettes.  She started smoking about 48 years ago. She has a 25.00 pack-year smoking history. she has never used smokeless tobacco. She reports that she does not drink  alcohol or use drugs.  Allergies:  Allergies  Allergen Reactions  . Dilaudid [Hydromorphone Hcl] Hives and Nausea Only  . Minocycline Hcl     REACTION: Dizzy  . Prednisone     REACTION: feels like throat swelling  . Varenicline Tartrate     REACTION: Dizzy(chantix)   . Zocor [Simvastatin - High Dose] Other (See Comments)    myalgia    Medications: Prior to Admission medications   Medication Sig Start Date End Date Taking? Authorizing Provider  levothyroxine (SYNTHROID, LEVOTHROID) 125 MCG tablet take 1 tablet by mouth once daily 11/24/16  Yes Nafziger, Tommi Rumps, NP  warfarin (COUMADIN) 1 MG tablet Take 1 tablet ('1mg'$ ) on Tuesdays and Saturdays along with a '5mg'$  tablet Patient taking differently: Take 1 mg by mouth 2 (two) times a week. Take 1 tablet ('1mg'$ ) on Tuesdays and Fridays along with a '5mg'$  tablet 01/22/17  Yes Herminio Commons, MD  warfarin (COUMADIN) 5 MG tablet Take 1 tablet (5 mg total) by mouth daily. 01/31/17  Yes Herminio Commons, MD  albuterol (PROVENTIL) (2.5 MG/3ML) 0.083% nebulizer solution USE 1 VIAL BY NEBULIZER EVERY 4 HOURS AS NEEDED FOR WHEEZING. DX: J44.9 01/24/17   Magdalen Spatz, NP  budesonide (PULMICORT) 0.5 MG/2ML nebulizer solution Take 2 mLs (0.5 mg total) by nebulization 2 (two) times daily. Dx: J43.9 01/24/17   Magdalen Spatz, NP  fentaNYL (DURAGESIC - DOSED MCG/HR) 50 MCG/HR Place 50 mcg onto the skin every 3 (three) days.    [provider]  fish oil-omega-3 fatty acids 1000 MG capsule Take 2 g by mouth daily.      [provider]  ipratropium-albuterol (DUONEB) 0.5-2.5 (3) MG/3ML SOLN 1 vial via nebulizer at breakfast, lunch, dinner and bedtime daily. May take 2 additional treatments on bad day Dx: J43.9 01/24/17   Magdalen Spatz, NP  Magnesium 500 MG CAPS Take 500 mg by mouth.    [provider]  nitroGLYCERIN (NITROSTAT) 0.4 MG SL tablet Place 1 tablet (0.4 mg total) under the tongue every 5 (five) minutes as needed for chest  pain. 10/28/14   Herminio Commons, MD  Respiratory Therapy Supplies (FLUTTER) DEVI Use as directed 05/14/15   Javier Glazier, MD  ticagrelor (BRILINTA) 60 MG TABS tablet Take 1 tablet (60 mg total) by mouth 2 (two) times daily. Patient not taking: Reported on 01/24/2017 10/28/14   Herminio Commons, MD    Scheduled Meds: . chlorhexidine gluconate (MEDLINE KIT)  15 mL Mouth Rinse BID  . enoxaparin (LOVENOX) injection  80 mg Subcutaneous Q12H  . insulin aspart  3 Units Subcutaneous Q4H  . ipratropium-albuterol  3 mL Nebulization Q6H  . levothyroxine  62.5 mcg Intravenous Daily  . mouth rinse  15 mL Mouth Rinse 10 times per day  . methylPREDNISolone (SOLU-MEDROL) injection  80 mg Intravenous Q8H  . oseltamivir  75 mg Oral BID   Continuous Infusions: . sodium chloride    . azithromycin Stopped (02/23/17 0146)  . famotidine (PEPCID) IV Stopped (02/23/17 1130)  . feeding supplement (VITAL AF 1.2 CAL) 1,000 mL (02/22/17 2201)  .  fentaNYL infusion INTRAVENOUS 350 mcg/hr (02/23/17 0919)  . piperacillin-tazobactam (ZOSYN)  IV Stopped (02/23/17 0736)   PRN Meds:.sodium chloride, albuterol, LORazepam     Results for orders placed or performed during the hospital encounter of 02/19/17 (from the past 48 hour(s))  Glucose, capillary     Status: Abnormal   Collection Time: 02/21/17  4:29 PM  Result Value Ref Range   Glucose-Capillary 118 (H) 65 - 99 mg/dL  Glucose, capillary     Status: Abnormal   Collection Time: 02/21/17  7:48 PM  Result Value Ref Range   Glucose-Capillary 121 (H) 65 - 99 mg/dL   Comment 1 Notify RN    Comment 2 Document in Chart   Glucose, capillary     Status: Abnormal   Collection Time: 02/21/17 11:40 PM  Result Value Ref Range   Glucose-Capillary 115 (H) 65 - 99 mg/dL   Comment 1 Notify RN    Comment 2 Document in Chart   Glucose, capillary     Status: Abnormal   Collection Time: 02/22/17  4:04 AM  Result Value Ref Range   Glucose-Capillary 103 (H) 65 -  99 mg/dL   Comment 1 Notify RN    Comment 2 Document in Chart   Blood gas, arterial     Status: Abnormal   Collection Time: 02/22/17  4:25 AM  Result Value Ref Range   FIO2 40.00    Delivery systems VENTILATOR    Mode PRESSURE REGULATED VOLUME CONTROL    VT 500 mL   LHR 20.0 resp/min   Peep/cpap 5.0 cm H20   pH, Arterial 7.298 (L) 7.350 - 7.450   pCO2 arterial 57.3 (H) 32.0 - 48.0 mmHg   pO2, Arterial 211 (H) 83.0 - 108.0 mmHg   Bicarbonate 24.0 20.0 - 28.0 mmol/L   Acid-Base Excess 1.4 0.0 - 2.0 mmol/L   O2 Saturation 98.8 %   Collection site RIGHT RADIAL    Drawn by [id number]    Sample type ARTERIAL DRAW    Allens test (pass/fail) PASS PASS    Comment: Performed at [facility], [address] [place], [place] [address]  Magnesium     Status: None   Collection Time: 02/22/17  5:43 AM  Result Value Ref Range   Magnesium 2.1 1.7 - 2.4 mg/dL    Comment: Performed at [facility], [address], [place], [place] [address]  Phosphorus     Status: None   Collection Time: 02/22/17  5:43 AM  Result Value Ref Range   Phosphorus 2.7 2.5 - 4.6 mg/dL    Comment: Performed at [facility], [address], [place], [place] [address]  Basic metabolic panel     Status: Abnormal   Collection Time: 02/22/17  5:43 AM  Result Value Ref Range   Sodium 138 135 - 145 mmol/L   Potassium 4.4 3.5 - 5.1 mmol/L   Chloride 104 101 - 111 mmol/L   CO2 26 22 - 32 mmol/L   Glucose, Bld 117 (H) 65 - 99 mg/dL   BUN 35 (H) 6 - 20 mg/dL   Creatinine, Ser 0.99 0.44 - 1.00 mg/dL   Calcium 8.6 (L) 8.9 - 10.3 mg/dL   GFR calc non Af Amer 58 (L) >60 mL/min   GFR calc Af Amer >60 >60 mL/min    Comment: (NOTE) The eGFR has been calculated using the CKD EPI equation. This calculation has not been validated in all clinical situations. eGFR's persistently <60 mL/min signify possible Chronic Kidney Disease.  Anion gap 8 5 - 15    Comment: Performed at [facility], [address], [place], [place] [address]    CBC     Status: Abnormal   Collection Time: 02/22/17  5:43 AM  Result Value Ref Range   WBC 8.4 4.0 - 10.5 K/uL   RBC 3.85 (L) 3.87 - 5.11 MIL/uL   Hemoglobin 11.1 (L) 12.0 - 15.0 g/dL   HCT 35.9 (L) 36.0 - 46.0 %   MCV 93.2 78.0 - 100.0 fL   MCH 28.8 26.0 - 34.0 pg   MCHC 30.9 30.0 - 36.0 g/dL   RDW 16.7 (H) 11.5 - 15.5 %   Platelets 229 150 - 400 K/uL    Comment: Performed at [facility], [address], [place], [place] [address]  Glucose, capillary     Status: Abnormal   Collection Time: 02/22/17  8:01 AM  Result Value Ref Range   Glucose-Capillary 104 (H) 65 - 99 mg/dL  Glucose, capillary     Status: Abnormal   Collection Time: 02/22/17 11:40 AM  Result Value Ref Range   Glucose-Capillary 120 (H) 65 - 99 mg/dL  Magnesium     Status: None   Collection Time: 02/22/17  4:58 PM  Result Value Ref Range   Magnesium 1.9 1.7 - 2.4 mg/dL    Comment: Performed at [facility], [address] [place], [place] [address]  Phosphorus     Status: None   Collection Time: 02/22/17  4:58 PM  Result Value Ref Range   Phosphorus 3.0 2.5 - 4.6 mg/dL    Comment: Performed at [facility], [address], [place], [place] [address]  Glucose, capillary     Status: Abnormal   Collection Time: 02/22/17  5:13 PM  Result Value Ref Range   Glucose-Capillary 110 (H) 65 - 99 mg/dL  Glucose, capillary     Status: Abnormal   Collection Time: 02/22/17  8:24 PM  Result Value Ref Range   Glucose-Capillary 139 (H) 65 - 99 mg/dL  Glucose, capillary     Status: Abnormal   Collection Time: 02/23/17 12:07 AM  Result Value Ref Range   Glucose-Capillary 153 (H) 65 - 99 mg/dL  Blood gas, arterial     Status: Abnormal   Collection Time: 02/23/17  5:10 AM  Result Value Ref Range   FIO2 40.00    Delivery systems VENTILATOR    Mode PRESSURE REGULATED VOLUME CONTROL    VT 500 mL   LHR 20 resp/min   Peep/cpap 5.0 cm H20   pH, Arterial 7.339 (L) 7.350 - 7.450   pCO2 arterial 60.00 (H) 32.0 - 48.0 mmHg    pO2, Arterial 118.0 (H) 83.0 - 108.0 mmHg   Bicarbonate 28.8 (H) 20.0 - 28.0 mmol/L   Acid-Base Excess 5.9 (H) 0.0 - 2.0 mmol/L   O2 Saturation 97.9 %   Collection site RIGHT RADIAL    Drawn by [id number]    Sample type ARTERIAL    Allens test (pass/fail) PASS PASS    Comment: Performed at [facility], [address], [place], [place] [address]  Magnesium     Status: None   Collection Time: 02/23/17  5:16 AM  Result Value Ref Range   Magnesium 1.9 1.7 - 2.4 mg/dL    Comment: Performed at [facility], [address] [place],  [address]  Phosphorus     Status: None   Collection Time: 02/23/17  5:16 AM  Result Value Ref Range   Phosphorus 3.5 2.5 -  4.6 mg/dL    Comment: Performed at [facility], [address], [place], [place] [address]  Basic metabolic panel     Status: Abnormal   Collection Time: 02/23/17  5:16 AM  Result Value Ref Range   Sodium 141 135 - 145 mmol/L   Potassium 4.0 3.5 - 5.1 mmol/L   Chloride 102 101 - 111 mmol/L   CO2 29 22 - 32 mmol/L   Glucose, Bld 131 (H) 65 - 99 mg/dL   BUN 42 (H) 6 - 20 mg/dL   Creatinine, Ser 1.11 (H) 0.44 - 1.00 mg/dL   Calcium 8.2 (L) 8.9 - 10.3 mg/dL   GFR calc non Af Amer 51 (L) >60 mL/min   GFR calc Af Amer 59 (L) >60 mL/min    Comment: (NOTE) The eGFR has been calculated using the CKD EPI equation. This calculation has not been validated in all clinical situations. eGFR's persistently <60 mL/min signify possible Chronic Kidney Disease.    Anion gap 10 5 - 15    Comment: Performed at [facility], [address], [place], [place] [address]  CBC     Status: Abnormal   Collection Time: 02/23/17  5:16 AM  Result Value Ref Range   WBC 8.3 4.0 - 10.5 K/uL   RBC 4.04 3.87 - 5.11 MIL/uL   Hemoglobin 11.4 (L) 12.0 - 15.0 g/dL   HCT 37.6 36.0 - 46.0 %   MCV 93.1 78.0 - 100.0 fL   MCH 28.2 26.0 - 34.0 pg   MCHC 30.3 30.0 - 36.0 g/dL   RDW 16.7 (H) 11.5 - 15.5 %   Platelets 203 150 - 400 K/uL    Comment: Performed at [facility], [address] [place], [place] [address]  Glucose, capillary     Status: Abnormal   Collection Time: 02/23/17  5:40 AM  Result Value Ref Range   Glucose-Capillary 135 (H) 65 - 99 mg/dL  Glucose, capillary     Status: Abnormal   Collection Time: 02/23/17  8:24 AM  Result Value Ref Range   Glucose-Capillary 129 (H) 65 - 99 mg/dL   Comment 1 Notify RN    Comment 2 Document in Chart   Glucose, capillary     Status: Abnormal   Collection Time: 02/23/17 11:39 AM  Result Value Ref Range   Glucose-Capillary 162 (H) 65 - 99 mg/dL   Comment 1 Notify RN    Comment 2 Document in Chart     Studies/Results:  CHEST CTA IMPRESSION: 1. No evidence of pulmonary embolus. 2. Bilateral emphysema, particularly at the upper lung lobes. Trace left-sided pleural fluid. Bibasilar atelectasis or scarring.       Fernand Sorbello A. Merlene [name], M.D.  Diplomate, Tax adviser of Psychiatry and Neurology ( Neurology). 02/23/2017, 2:22 PM

## 2017-02-24 ENCOUNTER — Inpatient Hospital Stay (HOSPITAL_COMMUNITY): Payer: Medicare Other

## 2017-02-24 LAB — CULTURE, BLOOD (ROUTINE X 2)
Culture: NO GROWTH
Culture: NO GROWTH
SPECIAL REQUESTS: ADEQUATE
Special Requests: ADEQUATE

## 2017-02-24 LAB — GLUCOSE, CAPILLARY
Glucose-Capillary: 135 mg/dL — ABNORMAL HIGH (ref 65–99)
Glucose-Capillary: 143 mg/dL — ABNORMAL HIGH (ref 65–99)
Glucose-Capillary: 148 mg/dL — ABNORMAL HIGH (ref 65–99)
Glucose-Capillary: 162 mg/dL — ABNORMAL HIGH (ref 65–99)

## 2017-02-24 LAB — BLOOD GAS, ARTERIAL
ACID-BASE EXCESS: 7.9 mmol/L — AB (ref 0.0–2.0)
Acid-Base Excess: 7.8 mmol/L — ABNORMAL HIGH (ref 0.0–2.0)
BICARBONATE: 30.3 mmol/L — AB (ref 20.0–28.0)
BICARBONATE: 29.8 mmol/L — AB (ref 20.0–28.0)
Drawn by: 22223
Drawn by: 277331
FIO2: 40
FIO2: 0.4
MECHVT: 500 mL
O2 Saturation: 95.8 %
O2 Saturation: 97.1 %
PEEP/CPAP: 5 cmH2O
PEEP/CPAP: 5 cmH2O
PH ART: 7.282 — AB (ref 7.350–7.450)
PO2 ART: 89.3 mmHg (ref 83.0–108.0)
PRESSURE SUPPORT: 12 cmH2O
Patient temperature: 37
RATE: 20 resp/min
pCO2 arterial: 64.4 mmHg — ABNORMAL HIGH (ref 32.0–48.0)
pCO2 arterial: 75.4 mmHg (ref 32.0–48.0)
pH, Arterial: 7.337 — ABNORMAL LOW (ref 7.350–7.450)
pO2, Arterial: 110 mmHg — ABNORMAL HIGH (ref 83.0–108.0)

## 2017-02-24 LAB — CBC
HCT: 38.9 % (ref 36.0–46.0)
Hemoglobin: 11.8 g/dL — ABNORMAL LOW (ref 12.0–15.0)
MCH: 28.3 pg (ref 26.0–34.0)
MCHC: 30.3 g/dL (ref 30.0–36.0)
MCV: 93.3 fL (ref 78.0–100.0)
PLATELETS: 195 10*3/uL (ref 150–400)
RBC: 4.17 MIL/uL (ref 3.87–5.11)
RDW: 16.8 % — AB (ref 11.5–15.5)
WBC: 10.5 10*3/uL (ref 4.0–10.5)

## 2017-02-24 LAB — BASIC METABOLIC PANEL
Anion gap: 9 (ref 5–15)
BUN: 39 mg/dL — AB (ref 6–20)
CO2: 29 mmol/L (ref 22–32)
CREATININE: 0.87 mg/dL (ref 0.44–1.00)
Calcium: 8 mg/dL — ABNORMAL LOW (ref 8.9–10.3)
Chloride: 105 mmol/L (ref 101–111)
Glucose, Bld: 149 mg/dL — ABNORMAL HIGH (ref 65–99)
POTASSIUM: 3.8 mmol/L (ref 3.5–5.1)
SODIUM: 143 mmol/L (ref 135–145)

## 2017-02-24 MED ORDER — FENTANYL CITRATE (PF) 2500 MCG/50ML IJ SOLN
INTRAMUSCULAR | Status: AC
  Filled 2017-02-24: qty 50

## 2017-02-24 NOTE — Progress Notes (Signed)
**Note De-Identified  Obfuscation** Patient weaned on cp/ps of 12/5 for 3 hours; ABG with critical value 75 PCO2, 7.28 PH. Patient placed back onto full support.  RRT to continue to monitor

## 2017-02-24 NOTE — Progress Notes (Signed)
[facility] PROGRESS NOTE  [name] MRN:[id number] DOB: 02/21/1950 DOA: 02/19/2017 PCP: Nafziger, Cory, NP  Interim summary and HPI [name]  is a 67 y.o. female, w hx of CAD, Hypothyroidism, PE, Factor V leiden, Copd on home o2, apparently c/o increase in dyspnea for 24 hours prior to admission.  Slight cough, dry-yellow sputum.  Denies fever, chills, cp, palp, n/v, diarrhea, brbpr, black stool.  Pt presents to ED due to increase in dyspnea.    CXR demonstrated acute on chronic pulmonary interstitial opacity; CTA neg for PE. Positive influenza A  Assessment/Plan: 1-acute on chronic resp failure with hypoxia and hypercapnia: in the setting of COPD exacerbation and influenza A infection . -patient initially treated with BIPAP, with very little improvement -COs increased over 100 and required ventilatory support -will follow rec's from pulmonary/critical care -antibiotics broaden  -continue steroids, nebulizer treatment and pulmicort  -follow ABG and CXR to assess/guide weaning process -continue Tamiflu -started tube feeding 2/13 -pulmonary weaning vent, possible extubation today per pulm team  2-tachycardia -in presentation of acute infection, albuterol treatment and anxiety -CTA neg for PE -TSH WNL -echo: EF 50-55%  3-hypothyroidism -continue synthroid  -half dose while given IV  4-GI prophylaxis -continue famotidine BID  5-Factor V mutation and hx of PE -while intubated will use lovenox per pharmacy -resume coumadin once stable and able to tolerate PO's  Acute Pulmonary Edema - Pt received lasix IV and has diuresed.   Repeat CXR clear.   Tremors / ?seizure activity - EEG ok, consulted neurology and felt that it was related to metabolic disturbances.   Code Status: Full Family Communication: no family at bedside  Disposition Plan: remains in ICU  Consultants:  Pulmonary/critical care service  Procedures:  See below for x-ray  reports  Ventilator dependent   2-D echo:    Antibiotics:  Vancomycin, zosyn and zithromax  2/11  HPI/Subjective: Sedated and mechanically ventilated;  Objective: Vitals:   02/24/17 0828 02/24/17 1110  BP:    Pulse:  (!) 117  Resp:  (!) 22  Temp:  98.2 F (36.8 C)  SpO2: 96% 98%    Intake/Output Summary (Last 24 hours) at 02/24/2017 1112 Last data filed at 02/24/2017 0700 Gross per 24 hour  Intake 3465.68 ml  Output 450 ml  Net 3015.68 ml   Filed Weights   02/22/17 0401 02/23/17 0500 02/24/17 0400  Weight: 81.3 kg (179 lb 3.7 oz) 82 kg (180 lb 12.4 oz) 84.5 kg (186 lb 4.6 oz)    Exam:   General: afebrile, sedated, with ventilatory support; no icterus.  Cardiovascular: S1 and S2, no rubs, no gallops  Respiratory: positive exp wheezing, diminish BS at bases and positive rhonchi.  Abdomen: soft, NT, ND, positive BS  Musculoskeletal: no edema, no cyanosis, no clubbing   Data Reviewed: Basic Metabolic Panel: Recent Labs  Lab 02/20/17 0619 02/21/17 0532 02/22/17 0543 02/22/17 1658 02/23/17 0516 02/23/17 1644 02/24/17 0422  NA 138 139 138  --  141  --  143  K 4.3 4.2 4.4  --  4.0  --  3.8  CL 102 105 104  --  102  --  105  CO2 28 27 26  --  29  --  29  GLUCOSE 128* 153* 117*  --  131*  --  149*  BUN 17 27* 35*  --  42*  --  39*  CREATININE 0.70 0.89 0.99  --  1.11*  --  0.87  CALCIUM 8.0* 8.5* 8.6*  --    8.2*  --  8.0*  MG  --   --  2.1 1.9 1.9 2.0  --   PHOS  --   --  2.7 3.0 3.5 2.8  --    Liver Function Tests: Recent Labs  Lab 02/19/17 1539 02/20/17 0619  AST 24 22  ALT 14 15  ALKPHOS 113 88  BILITOT 0.4 0.4  PROT 7.4 6.1*  ALBUMIN 3.3* 2.6*   CBC: Recent Labs  Lab 02/19/17 1539 02/20/17 0619 02/21/17 0532 02/22/17 0543 02/23/17 0516 02/24/17 0422  WBC 8.8 5.5 7.1 8.4 8.3 10.5  NEUTROABS 4.9  --   --   --   --   --   HGB 12.1 11.2* 11.2* 11.1* 11.4* 11.8*  HCT 40.2 37.2 37.2 35.9* 37.6 38.9  MCV 94.1 94.7 94.9 93.2 93.1 93.3   PLT 219 184 204 229 203 195   Cardiac Enzymes: Recent Labs  Lab 02/19/17 1539 02/20/17 0042 02/20/17 0619 02/20/17 1234  TROPONINI <0.03 <0.03 <0.03 <0.03   BNP (last 3 results) Recent Labs    02/19/17 1540  BNP 121.0*    Recent Results (from the past 240 hour(s))  Blood Culture (routine x 2)     Status: None   Collection Time: 02/19/17  3:39 PM  Result Value Ref Range Status   Specimen Description LEFT ANTECUBITAL  Final   Special Requests   Final    BOTTLES DRAWN AEROBIC AND ANAEROBIC Blood Culture adequate volume   Culture   Final    NO GROWTH 5 DAYS Performed at [facility], [address] [place], [place] [address]    Report Status 02/24/2017 FINAL  Final  Blood Culture (routine x 2)     Status: None   Collection Time: 02/19/17  3:44 PM  Result Value Ref Range Status   Specimen Description BLOOD RIGHT HAND  Final   Special Requests   Final    BOTTLES DRAWN AEROBIC AND ANAEROBIC Blood Culture adequate volume   Culture   Final    NO GROWTH 5 DAYS Performed at [facility], [address] [place], [place] [address]    Report Status 02/24/2017 FINAL  Final  MRSA PCR Screening     Status: None   Collection Time: 02/20/17 12:01 AM  Result Value Ref Range Status   MRSA by PCR NEGATIVE NEGATIVE Final    Comment:        The GeneXpert MRSA Assay (FDA approved for NASAL specimens only), is one component of a comprehensive MRSA colonization surveillance program. It is not intended to diagnose MRSA infection nor to guide or monitor treatment for MRSA infections. Performed at [facility], [address] [place], [place] [address]      Studies: Dg Chest [facility] 1 View  Result Date: 02/24/2017 CLINICAL DATA:  Respiratory failure EXAM: PORTABLE CHEST 1 VIEW COMPARISON:  02/23/2017 FINDINGS: Endotracheal tube in good position.  NG in place. Cardiac enlargement without heart failure.  Lungs remain clear. IMPRESSION: No active disease. Electronically Signed   By:  Franchot Gallo M.D.   On: 02/24/2017 08:27   Dg Chest Port 1 View  Result Date: 02/23/2017 CLINICAL DATA:  Respiratory failure. EXAM: PORTABLE CHEST 1 VIEW COMPARISON:  02/22/2017.  02/21/2017.  CT 02/20/2017. FINDINGS: Endotracheal tube and NG tube in stable position. Stable mild cardiomegaly. Very mild bilateral interstitial prominence again noted. These changes may be related to very mild interstitial edema and/or pneumonitis. Small bilateral pleural effusions appear to be present on today's exam. No pneumothorax.  IMPRESSION: 1.  Endotracheal tube and NG tube in stable position. 2. Stable mild cardiomegaly. Stable mild bilateral interstitial prominence and small bilateral pleural effusions. Findings suggest very mild changes of CHF. Pneumonitis cannot be excluded. Electronically Signed   By: Thomas  Register   On: 02/23/2017 07:22    Scheduled Meds: . chlorhexidine gluconate (MEDLINE KIT)  15 mL Mouth Rinse BID  . enoxaparin (LOVENOX) injection  80 mg Subcutaneous Q12H  . insulin aspart  3 Units Subcutaneous Q4H  . ipratropium-albuterol  3 mL Nebulization Q6H  . levothyroxine  62.5 mcg Intravenous Daily  . mouth rinse  15 mL Mouth Rinse 10 times per day  . methylPREDNISolone (SOLU-MEDROL) injection  80 mg Intravenous Q8H   Continuous Infusions: . sodium chloride    . azithromycin Stopped (02/24/17 0442)  . famotidine (PEPCID) IV Stopped (02/24/17 1000)  . feeding supplement (VITAL AF 1.2 CAL) 1,000 mL (02/24/17 0700)  . fentaNYL infusion INTRAVENOUS 30 mcg/hr (02/24/17 1005)  . piperacillin-tazobactam (ZOSYN)  IV Stopped (02/24/17 1000)   Principal Problem:   COPD exacerbation (HCC) Active Problems:   Tachycardia   Influenza A   Acute respiratory failure (HCC)  Critical Care Time spent: 32 minutes  Clanford Johnson  [facility] Pager [phone]. If 7PM-7AM, please contact night-coverage at [email or web address], password [email or web address] 02/24/2017, 11:12 AM  LOS: 5 days              

## 2017-02-24 NOTE — Progress Notes (Signed)
Subjective: She is doing okay with weaning from the ventilator this morning.  She had severe global slowing no epileptiform activity.  She failed weaning yesterday because of changes in her heart rate and blood pressure.  Objective: Vital signs in last 24 hours: Temp:  [97.5 F (36.4 C)-98.4 F (36.9 C)] 98.4 F (36.9 C) (02/16 0820) Pulse Rate:  [55-125] 125 (02/16 0820) Resp:  [20] 20 (02/16 0820) BP: (101-139)/(57-108) 133/108 (02/16 0700) SpO2:  [96 %-100 %] 96 % (02/16 0828) FiO2 (%):  [40 %] 40 % (02/16 0828) Weight:  [84.5 kg (186 lb 4.6 oz)] 84.5 kg (186 lb 4.6 oz) (02/16 0400) Weight change: 2.5 kg (5 lb 8.2 oz) Last BM Date: (UTA)  Intake/Output from previous day: 02/15 0701 - 02/16 0700 In: 3465.7 [I.V.:765.7; NG/GT:2650; IV Piggyback:50] Out: 450 [Urine:450]  PHYSICAL EXAM General appearance: Intubated sedated on mechanical ventilation Resp: rhonchi bilaterally Cardio: regular rate and rhythm, S1, S2 normal, no murmur, click, rub or gallop GI: soft, non-tender; bowel sounds normal; no masses,  no organomegaly Extremities: extremities normal, atraumatic, no cyanosis or edema Skin warm and dry  Lab Results:  Results for orders placed or performed during the hospital encounter of 02/19/17 (from the past 48 hour(s))  Glucose, capillary     Status: Abnormal   Collection Time: 02/22/17 11:40 AM  Result Value Ref Range   Glucose-Capillary 120 (H) 65 - 99 mg/dL  Magnesium     Status: None   Collection Time: 02/22/17  4:58 PM  Result Value Ref Range   Magnesium 1.9 1.7 - 2.4 mg/dL    Comment: Performed at [facility], [address] [place], [place] [address]  Phosphorus     Status: None   Collection Time: 02/22/17  4:58 PM  Result Value Ref Range   Phosphorus 3.0 2.5 - 4.6 mg/dL    Comment: Performed at [facility], [address] [place], [place] [address]  Glucose, capillary     Status: Abnormal   Collection Time: 02/22/17  5:13 PM  Result Value Ref Range   Glucose-Capillary 110 (H) 65 - 99 mg/dL  Glucose, capillary     Status: Abnormal   Collection Time: 02/22/17  8:24 PM  Result Value Ref Range   Glucose-Capillary 139 (H) 65 - 99 mg/dL  Glucose, capillary     Status: Abnormal   Collection Time: 02/23/17 12:07 AM  Result Value Ref Range   Glucose-Capillary 153 (H) 65 - 99 mg/dL  Blood gas, arterial     Status: Abnormal   Collection Time: 02/23/17  5:10 AM  Result Value Ref Range   FIO2 40.00    Delivery systems VENTILATOR    Mode PRESSURE REGULATED VOLUME CONTROL    VT 500 mL   LHR 20 resp/min   Peep/cpap 5.0 cm H20   pH, Arterial 7.339 (L) 7.350 - 7.450   pCO2 arterial 60.00 (H) 32.0 - 48.0 mmHg   pO2, Arterial 118.0 (H) 83.0 - 108.0 mmHg   Bicarbonate 28.8 (H) 20.0 - 28.0 mmol/L   Acid-Base Excess 5.9 (H) 0.0 - 2.0 mmol/L   O2 Saturation 97.9 %   Collection site RIGHT RADIAL    Drawn by [id number]    Sample type ARTERIAL    Allens test (pass/fail) PASS PASS    Comment: Performed at [facility], [address], [place], [place] [address]  Magnesium     Status: None   Collection Time: 02/23/17  5:16 AM  Result Value Ref Range   Magnesium  1.9 1.7 - 2.4 mg/dL    Comment: Performed at [facility], [address] [place], [place] [address]  Phosphorus     Status: None   Collection Time: 02/23/17  5:16 AM  Result Value Ref Range   Phosphorus 3.5 2.5 - 4.6 mg/dL    Comment: Performed at [facility], [address] [place], [place] [address]  Basic metabolic panel     Status: Abnormal   Collection Time: 02/23/17  5:16 AM  Result Value Ref Range   Sodium 141 135 - 145 mmol/L   Potassium 4.0 3.5 - 5.1 mmol/L   Chloride 102 101 - 111 mmol/L   CO2 29 22 - 32 mmol/L   Glucose, Bld 131 (H) 65 - 99 mg/dL   BUN 42 (H) 6 - 20 mg/dL   Creatinine, Ser 1.11 (H) 0.44 - 1.00 mg/dL   Calcium 8.2 (L) 8.9 - 10.3 mg/dL   GFR calc non Af Amer 51 (L) >60 mL/min   GFR calc Af Amer 59 (L) >60 mL/min    Comment: (NOTE) The eGFR has been calculated  using the CKD EPI equation. This calculation has not been validated in all clinical situations. eGFR's persistently <60 mL/min signify possible Chronic Kidney Disease.    Anion gap 10 5 - 15    Comment: Performed at [facility], [address], [place], [place] [address]  CBC     Status: Abnormal   Collection Time: 02/23/17  5:16 AM  Result Value Ref Range   WBC 8.3 4.0 - 10.5 K/uL   RBC 4.04 3.87 - 5.11 MIL/uL   Hemoglobin 11.4 (L) 12.0 - 15.0 g/dL   HCT 37.6 36.0 - 46.0 %   MCV 93.1 78.0 - 100.0 fL   MCH 28.2 26.0 - 34.0 pg   MCHC 30.3 30.0 - 36.0 g/dL   RDW 16.7 (H) 11.5 - 15.5 %   Platelets 203 150 - 400 K/uL    Comment: Performed at [facility], [address], [place], [place] [address]  Glucose, capillary     Status: Abnormal   Collection Time: 02/23/17  5:40 AM  Result Value Ref Range   Glucose-Capillary 135 (H) 65 - 99 mg/dL  Glucose, capillary     Status: Abnormal   Collection Time: 02/23/17  8:24 AM  Result Value Ref Range   Glucose-Capillary 129 (H) 65 - 99 mg/dL   Comment 1 Notify RN    Comment 2 Document in Chart   Glucose, capillary     Status: Abnormal   Collection Time: 02/23/17 11:39 AM  Result Value Ref Range   Glucose-Capillary 162 (H) 65 - 99 mg/dL   Comment 1 Notify RN    Comment 2 Document in Chart   Glucose, capillary     Status: Abnormal   Collection Time: 02/23/17  4:00 PM  Result Value Ref Range   Glucose-Capillary 106 (H) 65 - 99 mg/dL   Comment 1 Notify RN    Comment 2 Document in Chart   Magnesium     Status: None   Collection Time: 02/23/17  4:44 PM  Result Value Ref Range   Magnesium 2.0 1.7 - 2.4 mg/dL    Comment: Performed at [facility], [address] [place],  [address]  Phosphorus     Status: None   Collection Time: 02/23/17  4:44 PM  Result Value Ref Range   Phosphorus 2.8 2.5 - 4.6 mg/dL    Comment: Performed at [facility], [address],  [place], [place] [address]  Glucose, capillary     Status: Abnormal    Collection Time: 02/23/17  8:23 PM  Result Value Ref Range   Glucose-Capillary 139 (H) 65 - 99 mg/dL   Comment 1 Notify RN    Comment 2 Document in Chart   Glucose, capillary     Status: Abnormal   Collection Time: 02/24/17 12:15 AM  Result Value Ref Range   Glucose-Capillary 143 (H) 65 - 99 mg/dL   Comment 1 Notify RN    Comment 2 Document in Chart   Glucose, capillary     Status: Abnormal   Collection Time: 02/24/17  3:59 AM  Result Value Ref Range   Glucose-Capillary 135 (H) 65 - 99 mg/dL   Comment 1 Notify RN    Comment 2 Document in Chart   Blood gas, arterial     Status: Abnormal   Collection Time: 02/24/17  4:00 AM  Result Value Ref Range   FIO2 40.00    Delivery systems VENTILATOR    Mode PRESSURE REGULATED VOLUME CONTROL    VT 500 mL   LHR 20 resp/min   Peep/cpap 5.0 cm H20   pH, Arterial 7.337 (L) 7.350 - 7.450   pCO2 arterial 64.4 (H) 32.0 - 48.0 mmHg   pO2, Arterial 89.3 83.0 - 108.0 mmHg   Bicarbonate 30.3 (H) 20.0 - 28.0 mmol/L   Acid-Base Excess 7.8 (H) 0.0 - 2.0 mmol/L   O2 Saturation 95.8 %   Collection site RIGHT RADIAL    Drawn by [id number]    Sample type ARTERIAL    Allens test (pass/fail) PASS PASS    Comment: Performed at [facility], [address] [place], [place] [address]  Basic metabolic panel     Status: Abnormal   Collection Time: 02/24/17  4:22 AM  Result Value Ref Range   Sodium 143 135 - 145 mmol/L   Potassium 3.8 3.5 - 5.1 mmol/L   Chloride 105 101 - 111 mmol/L   CO2 29 22 - 32 mmol/L   Glucose, Bld 149 (H) 65 - 99 mg/dL   BUN 39 (H) 6 - 20 mg/dL   Creatinine, Ser 0.87 0.44 - 1.00 mg/dL   Calcium 8.0 (L) 8.9 - 10.3 mg/dL   GFR calc non Af Amer >60 >60 mL/min   GFR calc Af Amer >60 >60 mL/min    Comment: (NOTE) The eGFR has been calculated using the CKD EPI equation. This calculation has not been validated in all clinical situations. eGFR's persistently <60 mL/min signify possible Chronic Kidney Disease.    Anion gap 9 5 - 15     Comment: Performed at [facility], [address], [place], [place] [address]  CBC     Status: Abnormal   Collection Time: 02/24/17  4:22 AM  Result Value Ref Range   WBC 10.5 4.0 - 10.5 K/uL   RBC 4.17 3.87 - 5.11 MIL/uL   Hemoglobin 11.8 (L) 12.0 - 15.0 g/dL   HCT 38.9 36.0 - 46.0 %   MCV 93.3 78.0 - 100.0 fL   MCH 28.3 26.0 - 34.0 pg   MCHC 30.3 30.0 - 36.0 g/dL   RDW 16.8 (H) 11.5 - 15.5 %   Platelets 195 150 - 400 K/uL    Comment: Performed at [facility], [address] [place], [place] [address]    ABGS Recent Labs    02/24/17 0400  PHART 7.337*  PO2ART 89.3  HCO3 30.3*   CULTURES Recent Results (from the  past 240 hour(s))  Blood Culture (routine x 2)     Status: None   Collection Time: 02/19/17  3:39 PM  Result Value Ref Range Status   Specimen Description LEFT ANTECUBITAL  Final   Special Requests   Final    BOTTLES DRAWN AEROBIC AND ANAEROBIC Blood Culture adequate volume   Culture   Final    NO GROWTH 5 DAYS Performed at [facility], [address] [place], [place] [address]    Report Status 02/24/2017 FINAL  Final  Blood Culture (routine x 2)     Status: None   Collection Time: 02/19/17  3:44 PM  Result Value Ref Range Status   Specimen Description BLOOD RIGHT HAND  Final   Special Requests   Final    BOTTLES DRAWN AEROBIC AND ANAEROBIC Blood Culture adequate volume   Culture   Final    NO GROWTH 5 DAYS Performed at [facility], [address], [place], [place] [address]    Report Status 02/24/2017 FINAL  Final  MRSA PCR Screening     Status: None   Collection Time: 02/20/17 12:01 AM  Result Value Ref Range Status   MRSA by PCR NEGATIVE NEGATIVE Final    Comment:        The GeneXpert MRSA Assay (FDA approved for NASAL specimens only), is one component of a comprehensive MRSA colonization surveillance program. It is not intended to diagnose MRSA infection nor to guide or monitor treatment for MRSA infections. Performed at [facility],  [address], [place],  [address]    Studies/Results: Dg Chest Port 1 View  Result Date: 02/24/2017 CLINICAL DATA:  Respiratory failure EXAM: PORTABLE CHEST 1 VIEW COMPARISON:  02/23/2017 FINDINGS: Endotracheal tube in good position.  NG in place. Cardiac enlargement without heart failure.  Lungs remain clear. IMPRESSION: No active disease. Electronically Signed   By: Franchot Gallo M.D.   On: 02/24/2017 08:27   Dg Chest Port 1 View  Result Date: 02/23/2017 CLINICAL DATA:  Respiratory failure. EXAM: PORTABLE CHEST 1 VIEW COMPARISON:  02/22/2017.  02/21/2017.  CT 02/20/2017. FINDINGS: Endotracheal tube and NG tube in stable position. Stable mild cardiomegaly. Very mild bilateral interstitial prominence again noted. These changes may be related to very mild interstitial edema and/or pneumonitis. Small bilateral pleural effusions appear to be present on today's exam. No pneumothorax. IMPRESSION: 1.  Endotracheal tube and NG tube in stable position. 2. Stable mild cardiomegaly. Stable mild bilateral interstitial prominence and small bilateral pleural effusions. Findings suggest very mild changes of CHF. Pneumonitis cannot be excluded. Electronically Signed   By: Marcello Moores  Register   On: 02/23/2017 07:22    Medications:  Prior to Admission:  Medications Prior to Admission  Medication Sig Dispense Refill Last Dose  . levothyroxine (SYNTHROID, LEVOTHROID) 125 MCG tablet take 1 tablet by mouth once daily 90 tablet 2 unknown  . warfarin (COUMADIN) 1 MG tablet Take 1 tablet ('1mg'$ ) on Tuesdays and Saturdays along with a '5mg'$  tablet (Patient taking differently: Take 1 mg by mouth 2 (two) times a week. Take 1 tablet ('1mg'$ ) on Tuesdays and Fridays along with a '5mg'$  tablet) 30 tablet 4 unknown  . warfarin (COUMADIN) 5 MG tablet Take 1 tablet (5 mg total) by mouth daily. 90 tablet 3 unknown  . albuterol (PROVENTIL) (2.5 MG/3ML) 0.083% nebulizer solution USE 1 VIAL BY NEBULIZER EVERY 4 HOURS AS NEEDED FOR WHEEZING.  DX: J44.9 375 vial 0   . budesonide (PULMICORT) 0.5 MG/2ML nebulizer solution Take 2 mLs (  0.5 mg total) by nebulization 2 (two) times daily. Dx: J43.9 120 mL 3   . fentaNYL (DURAGESIC - DOSED MCG/HR) 50 MCG/HR Place 50 mcg onto the skin every 3 (three) days.   Taking  . fish oil-omega-3 fatty acids 1000 MG capsule Take 2 g by mouth daily.     Taking  . ipratropium-albuterol (DUONEB) 0.5-2.5 (3) MG/3ML SOLN 1 vial via nebulizer at breakfast, lunch, dinner and bedtime daily. May take 2 additional treatments on bad day Dx: J43.9 360 mL 3   . Magnesium 500 MG CAPS Take 500 mg by mouth.   Taking  . nitroGLYCERIN (NITROSTAT) 0.4 MG SL tablet Place 1 tablet (0.4 mg total) under the tongue every 5 (five) minutes as needed for chest pain. 25 tablet 3 Taking  . Respiratory Therapy Supplies (FLUTTER) DEVI Use as directed 1 each 0 Taking  . ticagrelor (BRILINTA) 60 MG TABS tablet Take 1 tablet (60 mg total) by mouth 2 (two) times daily. (Patient not taking: Reported on 01/24/2017) 60 tablet 11 Not Taking   Scheduled: . chlorhexidine gluconate (MEDLINE KIT)  15 mL Mouth Rinse BID  . enoxaparin (LOVENOX) injection  80 mg Subcutaneous Q12H  . insulin aspart  3 Units Subcutaneous Q4H  . ipratropium-albuterol  3 mL Nebulization Q6H  . levothyroxine  62.5 mcg Intravenous Daily  . mouth rinse  15 mL Mouth Rinse 10 times per day  . methylPREDNISolone (SOLU-MEDROL) injection  80 mg Intravenous Q8H   Continuous: . sodium chloride    . azithromycin Stopped (02/24/17 0442)  . famotidine (PEPCID) IV 20 mg (02/24/17 0841)  . feeding supplement (VITAL AF 1.2 CAL) 1,000 mL (02/24/17 0700)  . fentaNYL infusion INTRAVENOUS 300 mcg/hr (02/24/17 0700)  . piperacillin-tazobactam (ZOSYN)  IV 3.375 g (02/24/17 0558)   ZXY:DSWVTV chloride, albuterol, LORazepam  Assesment: She was admitted with influenza A.  She had acute hypoxic and hypercapnic respiratory failure and ended up having to be placed on mechanical ventilation.   Her oxygenation has improved.  She is doing okay with weaning attempt now.  She failed weaning yesterday. Principal Problem:   COPD exacerbation ([place]) Active Problems:   Tachycardia   Influenza A   Acute respiratory failure ([place])    Plan: Continue weaning process.  Possibly extubate later today    LOS: 5 days   Lakeyshia Tuckerman L 02/24/2017, 9:16 AM

## 2017-02-25 LAB — CBC
HCT: 35.8 % — ABNORMAL LOW (ref 36.0–46.0)
HEMOGLOBIN: 11 g/dL — AB (ref 12.0–15.0)
MCH: 28.6 pg (ref 26.0–34.0)
MCHC: 30.7 g/dL (ref 30.0–36.0)
MCV: 93.2 fL (ref 78.0–100.0)
Platelets: 172 10*3/uL (ref 150–400)
RBC: 3.84 MIL/uL — AB (ref 3.87–5.11)
RDW: 17 % — ABNORMAL HIGH (ref 11.5–15.5)
WBC: 9.3 10*3/uL (ref 4.0–10.5)

## 2017-02-25 LAB — BLOOD GAS, ARTERIAL
Acid-Base Excess: 8.7 mmol/L — ABNORMAL HIGH (ref 0.0–2.0)
BICARBONATE: 30.3 mmol/L — AB (ref 20.0–28.0)
Drawn by: 105551
FIO2: 35
LHR: 20 {breaths}/min
MECHANICAL RATE: 20
MECHVT: 500 mL
O2 SAT: 94.3 %
PEEP: 5 cmH2O
PH ART: 7.294 — AB (ref 7.350–7.450)
pCO2 arterial: 74.6 mmHg (ref 32.0–48.0)
pO2, Arterial: 84.1 mmHg (ref 83.0–108.0)

## 2017-02-25 LAB — GLUCOSE, CAPILLARY
Glucose-Capillary: 158 mg/dL — ABNORMAL HIGH (ref 65–99)
Glucose-Capillary: 133 mg/dL — ABNORMAL HIGH (ref 65–99)

## 2017-02-25 LAB — BASIC METABOLIC PANEL
ANION GAP: 9 (ref 5–15)
BUN: 39 mg/dL — ABNORMAL HIGH (ref 6–20)
CHLORIDE: 106 mmol/L (ref 101–111)
CO2: 32 mmol/L (ref 22–32)
Calcium: 8.2 mg/dL — ABNORMAL LOW (ref 8.9–10.3)
Creatinine, Ser: 0.8 mg/dL (ref 0.44–1.00)
GFR calc non Af Amer: >60 mL/min (ref >60)
Glucose, Bld: 145 mg/dL — ABNORMAL HIGH (ref 65–99)
Potassium: 4.1 mmol/L (ref 3.5–5.1)
Sodium: 147 mmol/L — ABNORMAL HIGH (ref 135–145)

## 2017-02-25 MED ORDER — METHYLPREDNISOLONE SODIUM SUCC 125 MG IJ SOLR
60.0000 mg | Freq: Three times a day (TID) | INTRAMUSCULAR | Status: DC
  Administered 2017-02-25 – 2017-03-04 (×21): 60 mg via INTRAVENOUS
  Filled 2017-02-25 (×21): qty 2

## 2017-02-25 MED ORDER — FREE WATER
200.0000 mL | Freq: Three times a day (TID) | Status: DC
  Administered 2017-02-25 – 2017-02-26 (×4): 200 mL

## 2017-02-25 NOTE — Progress Notes (Signed)
Pharmacy Antibiotic Note  [name] is a 67 y.o. female admitted on 02/19/2017 with sepsis.  Pharmacy has been consulted for zosyn dosing.  Plan: Continue Zosyn 3.375g IV q8h (4 hour infusion). Also, on azithromycin 500mg  IV q24h F/u cxs and clinical progress Monitor V/S and labs  Height: 5\' 5"  (165.1 cm) Weight: 182 lb 12.2 oz (82.9 kg) IBW/kg (Calculated) : 57  Temp (24hrs), Avg:98.5 F (36.9 C), Min:98.2 F (36.8 C), Max:99 F (37.2 C)  Recent Labs  Lab 02/19/17 1629  02/21/17 0532 02/22/17 0543 02/23/17 0516 02/24/17 0422 02/25/17 0450  WBC  --    < > 7.1 8.4 8.3 10.5 9.3  CREATININE  --    < > 0.89 0.99 1.11* 0.87 0.80  LATICACIDVEN 1.1  --   --   --   --   --   --    < > = values in this interval not displayed.    Estimated Creatinine Clearance: 73.6 mL/min (by C-G formula based on SCr of 0.8 mg/dL).    Allergies  Allergen Reactions  . Dilaudid [Hydromorphone Hcl] Hives and Nausea Only  . Minocycline Hcl     REACTION: Dizzy  . Prednisone     REACTION: feels like throat swelling  . Varenicline Tartrate     REACTION: Dizzy(chantix)   . Zocor [Simvastatin - High Dose] Other (See Comments)    myalgia    Antimicrobials this admission: Zosyn 2/11 >>  Vanc 2/11 >>2/14  Azith 2/11 >> Tamilfu until 2/16  Dose adjustments this admission:  n/a   Microbiology results:  2/11 BCx: ngtd 2/12MRSA PCR (-)  Thank you for allowing pharmacy to be a part of this patient's care.  Margot Ables, PharmD Clinical Pharmacist 02/25/2017 1:33 PM

## 2017-02-25 NOTE — Progress Notes (Signed)
[facility] PROGRESS NOTE  Arianne Klinge TFT:[id number] DOB: June 30, 1950 DOA: 02/19/2017 PCP: [name], NP  Interim summary and HPI [name]  is a 67 y.o. female, w hx of CAD, Hypothyroidism, PE, Factor V leiden, Copd on home o2, apparently c/o increase in dyspnea for 24 hours prior to admission.  Slight cough, dry-yellow sputum.  Denies fever, chills, cp, palp, n/v, diarrhea, brbpr, black stool.  Pt presents to ED due to increase in dyspnea.    CXR demonstrated acute on chronic pulmonary interstitial opacity; CTA neg for PE. Positive influenza A  Assessment/Plan: 1-acute on chronic resp failure with hypoxia and hypercapnia: in the setting of COPD exacerbation and influenza A infection . -patient initially treated with BIPAP, with very little improvement -COs increased over 100 and required ventilatory support -will follow rec's from pulmonary/critical care -continue weaning steroids, nebulizer treatment and pulmicort  -follow ABG and CXR to assess/guide weaning process -continue Tamiflu -started tube feeding 2/13 -pulmonary weaning vent.   2-tachycardia -in presentation of acute infection, albuterol treatment and anxiety -CTA neg for PE -TSH WNL -echo: EF 50-55%  3-hypothyroidism -continue synthroid  -half dose while given IV  4-GI prophylaxis -continue famotidine BID for GI protection  5-Factor V mutation and hx of PE -while intubated will use lovenox per pharmacy -resume coumadin once stable and able to tolerate PO's  Acute Pulmonary Edema - Pt received lasix IV and has diuresed.   Repeat CXR clear.   Tremors / ?seizure activity - EEG ok, consulted neurology and felt that it was related to metabolic disturbances.   Hypernatremia - add free water, recheck in AM.   Code Status: Full Family Communication: no family at bedside  Disposition Plan: remains in ICU  Consultants:  Pulmonary/critical care service  Procedures:  See below for x-ray  reports  Ventilator dependent   2-D echo:  Left ventricle: The cavity size was normal. Wall thickness was  increased in a pattern of mild LVH. Systolic function was normal.  The estimated ejection fraction was in the range of 55% to 60%.  Wall motion was normal; there were no regional wall motion  abnormalities. Features are consistent with a pseudonormal left  ventricular filling pattern, with concomitant abnormal relaxation and increased filling pressure (grade 2 diastolic dysfunction).  HPI/Subjective: Sedated and mechanically ventilated; no seizure or tremor reported.   Objective: Vitals:   02/25/17 0900 02/25/17 1000  BP: (!) 100/49 (!) 114/58  Pulse: (!) 56 74  Resp: 20 20  Temp: 98.8 F (37.1 C) 98.8 F (37.1 C)  SpO2: 98% 100%    Intake/Output Summary (Last 24 hours) at 02/25/2017 1023 Last data filed at 02/25/2017 0545 Gross per 24 hour  Intake 2499 ml  Output 800 ml  Net 1699 ml   Filed Weights   02/23/17 0500 02/24/17 0400 02/25/17 0500  Weight: 82 kg (180 lb 12.4 oz) 84.5 kg (186 lb 4.6 oz) 82.9 kg (182 lb 12.2 oz)    Exam:   General: afebrile, sedated, with ventilatory support; no icterus.  Cardiovascular: S1 and S2, no rubs, no gallops  Respiratory: continued exp wheezing, diminished BS at bases and few rhonchi improved.  Abdomen: soft, NT, ND, positive BS  Musculoskeletal: no edema, no cyanosis, no clubbing   Data Reviewed: Basic Metabolic Panel: Recent Labs  Lab 02/21/17 0532 02/22/17 0543 02/22/17 1658 02/23/17 0516 02/23/17 1644 02/24/17 0422 02/25/17 0450  NA 139 138  --  141  --  143 147*  K 4.2 4.4  --  4.0  --  3.8 4.1  CL 105 104  --  102  --  105 106  CO2 27 26  --  29  --  29 32  GLUCOSE 153* 117*  --  131*  --  149* 145*  BUN 27* 35*  --  42*  --  39* 39*  CREATININE 0.89 0.99  --  1.11*  --  0.87 0.80  CALCIUM 8.5* 8.6*  --  8.2*  --  8.0* 8.2*  MG  --  2.1 1.9 1.9 2.0  --   --   PHOS  --  2.7 3.0 3.5 2.8  --   --    Liver  Function Tests: Recent Labs  Lab 02/19/17 1539 02/20/17 0619  AST 24 22  ALT 14 15  ALKPHOS 113 88  BILITOT 0.4 0.4  PROT 7.4 6.1*  ALBUMIN 3.3* 2.6*   CBC: Recent Labs  Lab 02/19/17 1539  02/21/17 0532 02/22/17 0543 02/23/17 0516 02/24/17 0422 02/25/17 0450  WBC 8.8   < > 7.1 8.4 8.3 10.5 9.3  NEUTROABS 4.9  --   --   --   --   --   --   HGB 12.1   < > 11.2* 11.1* 11.4* 11.8* 11.0*  HCT 40.2   < > 37.2 35.9* 37.6 38.9 35.8*  MCV 94.1   < > 94.9 93.2 93.1 93.3 93.2  PLT 219   < > 204 229 203 195 172   < > = values in this interval not displayed.   Cardiac Enzymes: Recent Labs  Lab 02/19/17 1539 02/20/17 0042 02/20/17 0619 02/20/17 1234  TROPONINI <0.03 <0.03 <0.03 <0.03   BNP (last 3 results) Recent Labs    02/19/17 1540  BNP 121.0*    Recent Results (from the past 240 hour(s))  Blood Culture (routine x 2)     Status: None   Collection Time: 02/19/17  3:39 PM  Result Value Ref Range Status   Specimen Description LEFT ANTECUBITAL  Final   Special Requests   Final    BOTTLES DRAWN AEROBIC AND ANAEROBIC Blood Culture adequate volume   Culture   Final    NO GROWTH 5 DAYS Performed at [facility], [address], [place], [place] [address]    Report Status 02/24/2017 FINAL  Final  Blood Culture (routine x 2)     Status: None   Collection Time: 02/19/17  3:44 PM  Result Value Ref Range Status   Specimen Description BLOOD RIGHT HAND  Final   Special Requests   Final    BOTTLES DRAWN AEROBIC AND ANAEROBIC Blood Culture adequate volume   Culture   Final    NO GROWTH 5 DAYS Performed at [facility], [address] [place], [place] [address]    Report Status 02/24/2017 FINAL  Final  MRSA PCR Screening     Status: None   Collection Time: 02/20/17 12:01 AM  Result Value Ref Range Status   MRSA by PCR NEGATIVE NEGATIVE Final    Comment:        The GeneXpert MRSA Assay (FDA approved for NASAL specimens only), is one component of a comprehensive MRSA  colonization surveillance program. It is not intended to diagnose MRSA infection nor to guide or monitor treatment for MRSA infections. Performed at [facility], [address], [place],  [address]      Studies: Dg Chest [facility] 1 View  Result Date: 02/24/2017 CLINICAL DATA:  Respiratory failure EXAM: PORTABLE CHEST 1  VIEW COMPARISON:  02/23/2017 FINDINGS: Endotracheal tube in good position.  NG in place. Cardiac enlargement without heart failure.  Lungs remain clear. IMPRESSION: No active disease. Electronically Signed   By: Franchot Gallo M.D.   On: 02/24/2017 08:27    Scheduled Meds: . chlorhexidine gluconate (MEDLINE KIT)  15 mL Mouth Rinse BID  . enoxaparin (LOVENOX) injection  80 mg Subcutaneous Q12H  . free water  200 mL Per Tube Q8H  . insulin aspart  3 Units Subcutaneous Q4H  . ipratropium-albuterol  3 mL Nebulization Q6H  . levothyroxine  62.5 mcg Intravenous Daily  . mouth rinse  15 mL Mouth Rinse 10 times per day  . methylPREDNISolone (SOLU-MEDROL) injection  80 mg Intravenous Q8H   Continuous Infusions: . sodium chloride    . azithromycin Stopped (02/25/17 0128)  . famotidine (PEPCID) IV Stopped (02/24/17 2300)  . feeding supplement (VITAL AF 1.2 CAL) 1,000 mL (02/25/17 0545)  . fentaNYL infusion INTRAVENOUS 300 mcg/hr (02/25/17 0545)  . piperacillin-tazobactam (ZOSYN)  IV Stopped (02/25/17 1002)   Principal Problem:   COPD exacerbation ([place]) Active Problems:   Tachycardia   Influenza A   Acute respiratory failure [facility])  Critical Care Time spent: 31 minutes  [facility] Pager [phone]. If 7PM-7AM, please contact night-coverage at [email or web address], password [email or web address] 02/25/2017, 10:23 AM  LOS: 6 days

## 2017-02-25 NOTE — Progress Notes (Signed)
Subjective: She was not able to come off the ventilator yesterday.  Her pH was 7.28.  She actually did okay with the weaning process but her blood gas was not good.  She seems to have some sort of encephalopathy and we may not be able to get her off until that improves.  Objective: Vital signs in last 24 hours: Temp:  [98.2 F (36.8 C)-99 F (37.2 C)] 98.8 F (37.1 C) (02/17 1000) Pulse Rate:  [44-117] 74 (02/17 1000) Resp:  [15-22] 20 (02/17 1000) BP: (100-130)/(49-88) 114/58 (02/17 1000) SpO2:  [94 %-100 %] 100 % (02/17 1000) FiO2 (%):  [35 %-40 %] 35 % (02/17 0824) Weight:  [82.9 kg (182 lb 12.2 oz)] 82.9 kg (182 lb 12.2 oz) (02/17 0500) Weight change: -1.6 kg (-8.4 oz) Last BM Date: (UTA)  Intake/Output from previous day: 02/16 0701 - 02/17 0700 In: 2549 [I.V.:721.5; FQ/HK:2575.0; IV Piggyback:450] Out: 800 [Urine:800]  PHYSICAL EXAM General appearance: Intubated sedated on mechanical ventilation Resp: clear to auscultation bilaterally Cardio: regular rate and rhythm, S1, S2 normal, no murmur, click, rub or gallop GI: soft, non-tender; bowel sounds normal; no masses,  no organomegaly Extremities: extremities normal, atraumatic, no cyanosis or edema Skin warm and dry skin warm and dry  Lab Results:  Results for orders placed or performed during the hospital encounter of 02/19/17 (from the past 48 hour(s))  Glucose, capillary     Status: Abnormal   Collection Time: 02/23/17 11:39 AM  Result Value Ref Range   Glucose-Capillary 162 (H) 65 - 99 mg/dL   Comment 1 Notify RN    Comment 2 Document in Chart   Glucose, capillary     Status: Abnormal   Collection Time: 02/23/17  4:00 PM  Result Value Ref Range   Glucose-Capillary 106 (H) 65 - 99 mg/dL   Comment 1 Notify RN    Comment 2 Document in Chart   Magnesium     Status: None   Collection Time: 02/23/17  4:44 PM  Result Value Ref Range   Magnesium 2.0 1.7 - 2.4 mg/dL    Comment: Performed at [facility], [address] [place], [place] [address]  Phosphorus     Status: None   Collection Time: 02/23/17  4:44 PM  Result Value Ref Range   Phosphorus 2.8 2.5 - 4.6 mg/dL    Comment: Performed at [facility], [address], [place], [place] [address]  Glucose, capillary     Status: Abnormal   Collection Time: 02/23/17  8:23 PM  Result Value Ref Range   Glucose-Capillary 139 (H) 65 - 99 mg/dL   Comment 1 Notify RN    Comment 2 Document in Chart   Glucose, capillary     Status: Abnormal   Collection Time: 02/24/17 12:15 AM  Result Value Ref Range   Glucose-Capillary 143 (H) 65 - 99 mg/dL   Comment 1 Notify RN    Comment 2 Document in Chart   Glucose, capillary     Status: Abnormal   Collection Time: 02/24/17  3:59 AM  Result Value Ref Range   Glucose-Capillary 135 (H) 65 - 99 mg/dL   Comment 1 Notify RN    Comment 2 Document in Chart   Blood gas, arterial     Status: Abnormal   Collection Time: 02/24/17  4:00 AM  Result Value Ref Range   FIO2 40.00    Delivery systems VENTILATOR    Mode PRESSURE REGULATED VOLUME CONTROL    VT 500 mL  LHR 20 resp/min   Peep/cpap 5.0 cm H20   pH, Arterial 7.337 (L) 7.350 - 7.450   pCO2 arterial 64.4 (H) 32.0 - 48.0 mmHg   pO2, Arterial 89.3 83.0 - 108.0 mmHg   Bicarbonate 30.3 (H) 20.0 - 28.0 mmol/L   Acid-Base Excess 7.8 (H) 0.0 - 2.0 mmol/L   O2 Saturation 95.8 %   Collection site RIGHT RADIAL    Drawn by [id number]    Sample type ARTERIAL    Allens test (pass/fail) PASS PASS    Comment: Performed at [facility], [address], [place], [place] [address]  Basic metabolic panel     Status: Abnormal   Collection Time: 02/24/17  4:22 AM  Result Value Ref Range   Sodium 143 135 - 145 mmol/L   Potassium 3.8 3.5 - 5.1 mmol/L   Chloride 105 101 - 111 mmol/L   CO2 29 22 - 32 mmol/L   Glucose, Bld 149 (H) 65 - 99 mg/dL   BUN 39 (H) 6 - 20 mg/dL   Creatinine, Ser 0.87 0.44 - 1.00 mg/dL   Calcium 8.0 (L) 8.9 - 10.3 mg/dL   GFR calc non Af Amer >60 >60 mL/min    GFR calc Af Amer >60 >60 mL/min    Comment: (NOTE) The eGFR has been calculated using the CKD EPI equation. This calculation has not been validated in all clinical situations. eGFR's persistently <60 mL/min signify possible Chronic Kidney Disease.    Anion gap 9 5 - 15    Comment: Performed at [facility], [address], [place], [place] [address]  CBC     Status: Abnormal   Collection Time: 02/24/17  4:22 AM  Result Value Ref Range   WBC 10.5 4.0 - 10.5 K/uL   RBC 4.17 3.87 - 5.11 MIL/uL   Hemoglobin 11.8 (L) 12.0 - 15.0 g/dL   HCT 38.9 36.0 - 46.0 %   MCV 93.3 78.0 - 100.0 fL   MCH 28.3 26.0 - 34.0 pg   MCHC 30.3 30.0 - 36.0 g/dL   RDW 16.8 (H) 11.5 - 15.5 %   Platelets 195 150 - 400 K/uL    Comment: Performed at [facility], [address], [place], [place] [address]  Blood gas, arterial     Status: Abnormal   Collection Time: 02/24/17 10:20 AM  Result Value Ref Range   FIO2 0.40    Delivery systems VENTILATOR    Mode PRESSURE SUPPORT    Peep/cpap 5.0 cm H20   Pressure support 12.0 cm H20   pH, Arterial 7.282 (L) 7.350 - 7.450   pCO2 arterial 75.4 (HH) 32.0 - 48.0 mmHg    Comment: CRITICAL RESULT CALLED TO, READ BACK BY AND VERIFIED WITH: LAWENCE HYLTON RN AT 1104, BY WENDY VIA,RRT,RCP ON 02/24/2017    pO2, Arterial 110 (H) 83.0 - 108.0 mmHg   Bicarbonate 29.8 (H) 20.0 - 28.0 mmol/L   Acid-Base Excess 7.9 (H) 0.0 - 2.0 mmol/L   O2 Saturation 97.1 %   Patient temperature 37.0    Collection site LEFT RADIAL    Drawn by [id number]    Sample type ARTERIAL DRAW    Allens test (pass/fail) PASS PASS    Comment: Performed at [facility], [address] [place], [place] [address]  Glucose, capillary     Status: Abnormal   Collection Time: 02/24/17  7:46 PM  Result Value Ref Range   Glucose-Capillary 148 (H) 65 - 99 mg/dL   Comment 1 Notify RN  Glucose, capillary     Status: Abnormal   Collection Time: 02/24/17 11:38 PM  Result Value Ref Range   Glucose-Capillary 162  (H) 65 - 99 mg/dL   Comment 1 Notify RN   Glucose, capillary     Status: Abnormal   Collection Time: 02/25/17  4:03 AM  Result Value Ref Range   Glucose-Capillary 158 (H) 65 - 99 mg/dL   Comment 1 Notify RN   Blood gas, arterial     Status: Abnormal   Collection Time: 02/25/17  4:15 AM  Result Value Ref Range   FIO2 35.00    Delivery systems VENTILATOR    Mode PRESSURE REGULATED VOLUME CONTROL    VT 500 mL   LHR 20 resp/min   Peep/cpap 5.0 cm H20   pH, Arterial 7.294 (L) 7.350 - 7.450   pCO2 arterial 74.6 (HH) 32.0 - 48.0 mmHg    Comment: CRITICAL RESULT CALLED TO, READ BACK BY AND VERIFIED WITH:  TO J DANIELS AT 0425 02/24/17 BY LANC RRT    pO2, Arterial 84.1 83.0 - 108.0 mmHg   Bicarbonate 30.3 (H) 20.0 - 28.0 mmol/L   Acid-Base Excess 8.7 (H) 0.0 - 2.0 mmol/L   O2 Saturation 94.3 %   Collection site RADIAL    Drawn by [id number]    Sample type ARTERIAL    Allens test (pass/fail) PASS PASS   Mechanical Rate 20     Comment: Performed at [facility], [address] [place], [place] [address]  Basic metabolic panel     Status: Abnormal   Collection Time: 02/25/17  4:50 AM  Result Value Ref Range   Sodium 147 (H) 135 - 145 mmol/L   Potassium 4.1 3.5 - 5.1 mmol/L   Chloride 106 101 - 111 mmol/L   CO2 32 22 - 32 mmol/L   Glucose, Bld 145 (H) 65 - 99 mg/dL   BUN 39 (H) 6 - 20 mg/dL   Creatinine, Ser 0.80 0.44 - 1.00 mg/dL   Calcium 8.2 (L) 8.9 - 10.3 mg/dL   GFR calc non Af Amer >60 >60 mL/min   GFR calc Af Amer >60 >60 mL/min    Comment: (NOTE) The eGFR has been calculated using the CKD EPI equation. This calculation has not been validated in all clinical situations. eGFR's persistently <60 mL/min signify possible Chronic Kidney Disease.    Anion gap 9 5 - 15    Comment: Performed at [facility], [address], [place], [place] [address]  CBC     Status: Abnormal   Collection Time: 02/25/17  4:50 AM  Result Value Ref Range   WBC 9.3 4.0 - 10.5 K/uL   RBC 3.84 (L) 3.87  - 5.11 MIL/uL   Hemoglobin 11.0 (L) 12.0 - 15.0 g/dL   HCT 35.8 (L) 36.0 - 46.0 %   MCV 93.2 78.0 - 100.0 fL   MCH 28.6 26.0 - 34.0 pg   MCHC 30.7 30.0 - 36.0 g/dL   RDW 17.0 (H) 11.5 - 15.5 %   Platelets 172 150 - 400 K/uL    Comment: Performed at [facility], [address] [place],  [address]    ABGS Recent Labs    02/25/17 0415  PHART 7.294*  PO2ART 84.1  HCO3 30.3*   CULTURES Recent Results (from the past 240 hour(s))  Blood Culture (routine x 2)     Status: None   Collection Time: 02/19/17  3:39 PM  Result Value Ref Range Status   Specimen Description  LEFT ANTECUBITAL  Final   Special Requests   Final    BOTTLES DRAWN AEROBIC AND ANAEROBIC Blood Culture adequate volume   Culture   Final    NO GROWTH 5 DAYS Performed at [facility], [address], [place], [place] [address]    Report Status 02/24/2017 FINAL  Final  Blood Culture (routine x 2)     Status: None   Collection Time: 02/19/17  3:44 PM  Result Value Ref Range Status   Specimen Description BLOOD RIGHT HAND  Final   Special Requests   Final    BOTTLES DRAWN AEROBIC AND ANAEROBIC Blood Culture adequate volume   Culture   Final    NO GROWTH 5 DAYS Performed at [facility], [address], [place], [place] [address]    Report Status 02/24/2017 FINAL  Final  MRSA PCR Screening     Status: None   Collection Time: 02/20/17 12:01 AM  Result Value Ref Range Status   MRSA by PCR NEGATIVE NEGATIVE Final    Comment:        The GeneXpert MRSA Assay (FDA approved for NASAL specimens only), is one component of a comprehensive MRSA colonization surveillance program. It is not intended to diagnose MRSA infection nor to guide or monitor treatment for MRSA infections. Performed at [facility], [address] [place],  [address]    Studies/Results: Dg Chest Port 1 View  Result Date: 02/24/2017 CLINICAL DATA:  Respiratory failure EXAM: PORTABLE CHEST 1 VIEW COMPARISON:  02/23/2017 FINDINGS:  Endotracheal tube in good position.  NG in place. Cardiac enlargement without heart failure.  Lungs remain clear. IMPRESSION: No active disease. Electronically Signed   By: Franchot Gallo M.D.   On: 02/24/2017 08:27    Medications:  Prior to Admission:  Medications Prior to Admission  Medication Sig Dispense Refill Last Dose  . levothyroxine (SYNTHROID, LEVOTHROID) 125 MCG tablet take 1 tablet by mouth once daily 90 tablet 2 unknown  . warfarin (COUMADIN) 1 MG tablet Take 1 tablet (89m) on Tuesdays and Saturdays along with a 532mtablet (Patient taking differently: Take 1 mg by mouth 2 (two) times a week. Take 1 tablet (13m29mon Tuesdays and Fridays along with a 5mg47mblet) 30 tablet 4 unknown  . warfarin (COUMADIN) 5 MG tablet Take 1 tablet (5 mg total) by mouth daily. 90 tablet 3 unknown  . albuterol (PROVENTIL) (2.5 MG/3ML) 0.083% nebulizer solution USE 1 VIAL BY NEBULIZER EVERY 4 HOURS AS NEEDED FOR WHEEZING. DX: J44.9 375 vial 0   . budesonide (PULMICORT) 0.5 MG/2ML nebulizer solution Take 2 mLs (0.5 mg total) by nebulization 2 (two) times daily. Dx: J43.9 120 mL 3   . fentaNYL (DURAGESIC - DOSED MCG/HR) 50 MCG/HR Place 50 mcg onto the skin every 3 (three) days.   Taking  . fish oil-omega-3 fatty acids 1000 MG capsule Take 2 g by mouth daily.     Taking  . ipratropium-albuterol (DUONEB) 0.5-2.5 (3) MG/3ML SOLN 1 vial via nebulizer at breakfast, lunch, dinner and bedtime daily. May take 2 additional treatments on bad day Dx: J43.9 360 mL 3   . Magnesium 500 MG CAPS Take 500 mg by mouth.   Taking  . nitroGLYCERIN (NITROSTAT) 0.4 MG SL tablet Place 1 tablet (0.4 mg total) under the tongue every 5 (five) minutes as needed for chest pain. 25 tablet 3 Taking  . Respiratory Therapy Supplies (FLUTTER) DEVI Use as directed 1 each 0 Taking  . ticagrelor (BRILINTA) 60 MG TABS tablet  Take 1 tablet (60 mg total) by mouth 2 (two) times daily. (Patient not taking: Reported on 01/24/2017) 60 tablet 11 Not  Taking   Scheduled: . chlorhexidine gluconate (MEDLINE KIT)  15 mL Mouth Rinse BID  . enoxaparin (LOVENOX) injection  80 mg Subcutaneous Q12H  . free water  200 mL Per Tube Q8H  . insulin aspart  3 Units Subcutaneous Q4H  . ipratropium-albuterol  3 mL Nebulization Q6H  . levothyroxine  62.5 mcg Intravenous Daily  . mouth rinse  15 mL Mouth Rinse 10 times per day  . methylPREDNISolone (SOLU-MEDROL) injection  60 mg Intravenous Q8H   Continuous: . sodium chloride    . azithromycin Stopped (02/25/17 0128)  . famotidine (PEPCID) IV Stopped (02/24/17 2300)  . feeding supplement (VITAL AF 1.2 CAL) 1,000 mL (02/25/17 0545)  . fentaNYL infusion INTRAVENOUS 300 mcg/hr (02/25/17 0545)  . piperacillin-tazobactam (ZOSYN)  IV Stopped (02/25/17 1002)   KIC:HTVGVS chloride, albuterol, LORazepam  Assesment: She was admitted with COPD exacerbation and acute hypoxic and hypercapnic respiratory failure requiring intubation and mechanical ventilation.  She has influenza A.  She seems to have some sort of encephalopathy and was not able to come off the ventilator yesterday. Principal Problem:   COPD exacerbation ([place]) Active Problems:   Tachycardia   Influenza A   Acute respiratory failure ([place])    Plan: Continue current treatments no weaning attempt today.  Try again tomorrow    LOS: 6 days   Carmina Walle L 02/25/2017, 10:58 AM

## 2017-02-26 ENCOUNTER — Inpatient Hospital Stay (HOSPITAL_COMMUNITY): Payer: Medicare Other

## 2017-02-26 DIAGNOSIS — E870 Hyperosmolality and hypernatremia: Secondary | ICD-10-CM

## 2017-02-26 LAB — GLUCOSE, CAPILLARY
GLUCOSE-CAPILLARY: 140 mg/dL — AB (ref 65–99)
GLUCOSE-CAPILLARY: 123 mg/dL — AB (ref 65–99)
Glucose-Capillary: 107 mg/dL — ABNORMAL HIGH (ref 65–99)
Glucose-Capillary: 113 mg/dL — ABNORMAL HIGH (ref 65–99)
Glucose-Capillary: 129 mg/dL — ABNORMAL HIGH (ref 65–99)
Glucose-Capillary: 130 mg/dL — ABNORMAL HIGH (ref 65–99)
Glucose-Capillary: 132 mg/dL — ABNORMAL HIGH (ref 65–99)

## 2017-02-26 LAB — BLOOD GAS, ARTERIAL
ACID-BASE EXCESS: 9.1 mmol/L — AB (ref 0.0–2.0)
Acid-Base Excess: 9.1 mmol/L — ABNORMAL HIGH (ref 0.0–2.0)
Acid-Base Excess: 13.8 mmol/L — ABNORMAL HIGH (ref 0.0–2.0)
BICARBONATE: 31.6 mmol/L — AB (ref 20.0–28.0)
Bicarbonate: 31.5 mmol/L — ABNORMAL HIGH (ref 20.0–28.0)
Bicarbonate: 35.7 mmol/L — ABNORMAL HIGH (ref 20.0–28.0)
DRAWN BY: 33100
Drawn by: 105551
Drawn by: 270161
FIO2: 35
FIO2: 35
O2 CONTENT: 4 L/min
O2 SAT: 93.6 %
O2 Saturation: 94.9 %
O2 Saturation: 95 %
PATIENT TEMPERATURE: 37.3
PCO2 ART: 65.7 mmHg — AB (ref 32.0–48.0)
PEEP/CPAP: 5 cmH2O
PEEP: 5 cmH2O
PH ART: 7.38 (ref 7.350–7.450)
PO2 ART: 83.9 mmHg (ref 83.0–108.0)
PO2 ART: 71.4 mmHg — AB (ref 83.0–108.0)
Patient temperature: 37.3
Pressure support: 10 cmH2O
RATE: 20 resp/min
VT: 500 mL
pCO2 arterial: 59.4 mmHg — ABNORMAL HIGH (ref 32.0–48.0)
pCO2 arterial: 63.2 mmHg — ABNORMAL HIGH (ref 32.0–48.0)
pH, Arterial: 7.36 (ref 7.350–7.450)
pH, Arterial: 7.39 (ref 7.350–7.450)
pO2, Arterial: 80.1 mmHg — ABNORMAL LOW (ref 83.0–108.0)

## 2017-02-26 LAB — CBC
HCT: 35.8 % — ABNORMAL LOW (ref 36.0–46.0)
Hemoglobin: 11.1 g/dL — ABNORMAL LOW (ref 12.0–15.0)
MCH: 28.8 pg (ref 26.0–34.0)
MCHC: 31 g/dL (ref 30.0–36.0)
MCV: 92.7 fL (ref 78.0–100.0)
PLATELETS: 181 10*3/uL (ref 150–400)
RBC: 3.86 MIL/uL — ABNORMAL LOW (ref 3.87–5.11)
RDW: 17 % — AB (ref 11.5–15.5)
WBC: 11.4 10*3/uL — ABNORMAL HIGH (ref 4.0–10.5)

## 2017-02-26 LAB — BASIC METABOLIC PANEL
Anion gap: 9 (ref 5–15)
BUN: 35 mg/dL — AB (ref 6–20)
CHLORIDE: 106 mmol/L (ref 101–111)
CO2: 32 mmol/L (ref 22–32)
CREATININE: 0.76 mg/dL (ref 0.44–1.00)
Calcium: 8.4 mg/dL — ABNORMAL LOW (ref 8.9–10.3)
GFR calc Af Amer: >60 mL/min (ref >60)
GFR calc non Af Amer: >60 mL/min (ref >60)
Glucose, Bld: 145 mg/dL — ABNORMAL HIGH (ref 65–99)
Potassium: 4.3 mmol/L (ref 3.5–5.1)
Sodium: 147 mmol/L — ABNORMAL HIGH (ref 135–145)

## 2017-02-26 MED ORDER — METOPROLOL TARTRATE 5 MG/5ML IV SOLN
5.0000 mg | Freq: Once | INTRAVENOUS | Status: AC
  Administered 2017-02-26: 5 mg via INTRAVENOUS
  Filled 2017-02-26: qty 5

## 2017-02-26 MED ORDER — FENTANYL CITRATE (PF) 2500 MCG/50ML IJ SOLN
INTRAMUSCULAR | Status: AC
  Filled 2017-02-26: qty 50

## 2017-02-26 MED ORDER — FREE WATER
200.0000 mL | Status: DC
  Administered 2017-02-26 (×2): 200 mL

## 2017-02-26 MED ORDER — METOPROLOL TARTRATE 5 MG/5ML IV SOLN: 2.5000 mg | Freq: Four times a day (QID) | INTRAVENOUS | Status: DC | PRN

## 2017-02-26 MED ORDER — FUROSEMIDE 10 MG/ML IJ SOLN
40.0000 mg | Freq: Once | INTRAMUSCULAR | Status: AC
  Administered 2017-02-26: 40 mg via INTRAVENOUS
  Filled 2017-02-26: qty 4

## 2017-02-26 NOTE — Progress Notes (Signed)
Subjective: She remains intubated and on the ventilator she was admitted with influenza sepsis and acute hypoxic and hypercapnic respiratory failure.  She seems to have some element of encephalopathy.  Objective: Vital signs in last 24 hours: Temp:  [98.1 F (36.7 C)-99.1 F (37.3 C)] 99.1 F (37.3 C) (02/18 0645) Pulse Rate:  [56-113] 62 (02/18 0645) Resp:  [19-22] 20 (02/18 0645) BP: (99-157)/(49-88) 119/68 (02/18 0645) SpO2:  [94 %-100 %] 99 % (02/18 0645) FiO2 (%):  [35 %] 35 % (02/18 0400) Weight:  [85.5 kg (188 lb 7.9 oz)] 85.5 kg (188 lb 7.9 oz) (02/18 0500) Weight change: 2.6 kg (5 lb 11.7 oz) Last BM Date: (UTA)  Intake/Output from previous day: 02/17 0701 - 02/18 0700 In: 3216.6 [I.V.:734.1; NG/GT:1612.5; IV Piggyback:750] Out: 1020 [Urine:1020]  PHYSICAL EXAM General appearance: She is a little more awake than I have seen opening her eyes and looking around.  She is intubated and on the ventilator. Resp: rhonchi bilaterally Cardio: regular rate and rhythm, S1, S2 normal, no murmur, click, rub or gallop GI: soft, non-tender; bowel sounds normal; no masses,  no organomegaly Extremities: extremities normal, atraumatic, no cyanosis or edema Skin warm and dry  Lab Results:  Results for orders placed or performed during the hospital encounter of 02/19/17 (from the past 48 hour(s))  Blood gas, arterial     Status: Abnormal   Collection Time: 02/24/17 10:20 AM  Result Value Ref Range   FIO2 0.40    Delivery systems VENTILATOR    Mode PRESSURE SUPPORT    Peep/cpap 5.0 cm H20   Pressure support 12.0 cm H20   pH, Arterial 7.282 ([name]) 7.350 - 7.450   pCO2 arterial 75.4 (HH) 32.0 - 48.0 mmHg    Comment: CRITICAL RESULT CALLED TO, READ BACK BY AND VERIFIED WITH: LAWENCE HYLTON RN AT 1104, BY WENDY VIA,RRT,RCP ON 02/24/2017    pO2, Arterial 110 (H) 83.0 - 108.0 mmHg   Bicarbonate 29.8 (H) 20.0 - 28.0 mmol/[name]   Acid-Base Excess 7.9 (H) 0.0 - 2.0 mmol/[name]   O2 Saturation 97.1 %    Patient temperature 37.0    Collection site LEFT RADIAL    Drawn by [id number]    Sample type ARTERIAL DRAW    Allens test (pass/fail) PASS PASS    Comment: Performed at [facility], [address] [place], [place] [address]  Glucose, capillary     Status: Abnormal   Collection Time: 02/24/17  7:46 PM  Result Value Ref Range   Glucose-Capillary 148 (H) 65 - 99 mg/dL   Comment 1 Notify RN   Glucose, capillary     Status: Abnormal   Collection Time: 02/24/17 11:38 PM  Result Value Ref Range   Glucose-Capillary 162 (H) 65 - 99 mg/dL   Comment 1 Notify RN   Glucose, capillary     Status: Abnormal   Collection Time: 02/25/17  4:03 AM  Result Value Ref Range   Glucose-Capillary 158 (H) 65 - 99 mg/dL   Comment 1 Notify RN   Blood gas, arterial     Status: Abnormal   Collection Time: 02/25/17  4:15 AM  Result Value Ref Range   FIO2 35.00    Delivery systems VENTILATOR    Mode PRESSURE REGULATED VOLUME CONTROL    VT 500 mL   LHR 20 resp/min   Peep/cpap 5.0 cm H20   pH, Arterial 7.294 ([name]) 7.350 - 7.450   pCO2 arterial 74.6 (HH) 32.0 - 48.0 mmHg    Comment:  CRITICAL RESULT CALLED TO, READ BACK BY AND VERIFIED WITH:  TO J DANIELS AT 0425 02/24/17 BY [facility] RRT    pO2, Arterial 84.1 83.0 - 108.0 mmHg   Bicarbonate 30.3 (H) 20.0 - 28.0 mmol/[name]   Acid-Base Excess 8.7 (H) 0.0 - 2.0 mmol/[name]   O2 Saturation 94.3 %   Collection site RADIAL    Drawn by [id number]    Sample type ARTERIAL    Allens test (pass/fail) PASS PASS   Mechanical Rate 20     Comment: Performed at [facility], [address], [place], [place] [address]  Basic metabolic panel     Status: Abnormal   Collection Time: 02/25/17  4:50 AM  Result Value Ref Range   Sodium 147 (H) 135 - 145 mmol/[name]   Potassium 4.1 3.5 - 5.1 mmol/[name]   Chloride 106 101 - 111 mmol/[name]   CO2 32 22 - 32 mmol/[name]   Glucose, Bld 145 (H) 65 - 99 mg/dL   BUN 39 (H) 6 - 20 mg/dL   Creatinine, Ser 0.80 0.44 - 1.00 mg/dL   Calcium 8.2 ([name]) 8.9 - 10.3 mg/dL   GFR  calc non Af Amer >60 >60 mL/min   GFR calc Af Amer >60 >60 mL/min    Comment: (NOTE) The eGFR has been calculated using the CKD EPI equation. This calculation has not been validated in all clinical situations. eGFR's persistently <60 mL/min signify possible Chronic Kidney Disease.    Anion gap 9 5 - 15    Comment: Performed at [facility], [address], [place], [place] [address]  CBC     Status: Abnormal   Collection Time: 02/25/17  4:50 AM  Result Value Ref Range   WBC 9.3 4.0 - 10.5 K/uL   RBC 3.84 ([name]) 3.87 - 5.11 MIL/uL   Hemoglobin 11.0 ([name]) 12.0 - 15.0 g/dL   HCT 35.8 ([name]) 36.0 - 46.0 %   MCV 93.2 78.0 - 100.0 fL   MCH 28.6 26.0 - 34.0 pg   MCHC 30.7 30.0 - 36.0 g/dL   RDW 17.0 (H) 11.5 - 15.5 %   Platelets 172 150 - 400 K/uL    Comment: Performed at [facility], [address] [place], [place] [address]  Glucose, capillary     Status: Abnormal   Collection Time: 02/25/17  7:39 PM  Result Value Ref Range   Glucose-Capillary 133 (H) 65 - 99 mg/dL   Comment 1 Notify RN   Glucose, capillary     Status: Abnormal   Collection Time: 02/25/17 11:49 PM  Result Value Ref Range   Glucose-Capillary 132 (H) 65 - 99 mg/dL   Comment 1 Notify RN    Comment 2 Document in Chart   Blood gas, arterial     Status: Abnormal   Collection Time: 02/26/17  3:30 AM  Result Value Ref Range   FIO2 35.00    Delivery systems VENTILATOR    Mode PRESSURE REGULATED VOLUME CONTROL    VT 500 mL   LHR 20 resp/min   Peep/cpap 5.0 cm H20   pH, Arterial 7.380 7.350 - 7.450   pCO2 arterial 59.4 (H) 32.0 - 48.0 mmHg   pO2, Arterial 80.1 ([name]) 83.0 - 108.0 mmHg   Bicarbonate 31.6 (H) 20.0 - 28.0 mmol/[name]   Acid-Base Excess 9.1 (H) 0.0 - 2.0 mmol/[name]   O2 Saturation 94.9 %   Collection site RADIAL    Drawn by [id number]    Sample type ARTERIAL    Allens test (  pass/fail) PASS PASS    Comment: Performed at [facility], [address] [place], [place] [address]  Glucose, capillary     Status: Abnormal    Collection Time: 02/26/17  4:18 AM  Result Value Ref Range   Glucose-Capillary 140 (H) 65 - 99 mg/dL   Comment 1 Notify RN     ABGS Recent Labs    02/26/17 0330  PHART 7.380  PO2ART 80.1*  HCO3 31.6*   CULTURES Recent Results (from the past 240 hour(s))  Blood Culture (routine x 2)     Status: None   Collection Time: 02/19/17  3:39 PM  Result Value Ref Range Status   Specimen Description LEFT ANTECUBITAL  Final   Special Requests   Final    BOTTLES DRAWN AEROBIC AND ANAEROBIC Blood Culture adequate volume   Culture   Final    NO GROWTH 5 DAYS Performed at [facility], [address] [place], [place] [address]    Report Status 02/24/2017 FINAL  Final  Blood Culture (routine x 2)     Status: None   Collection Time: 02/19/17  3:44 PM  Result Value Ref Range Status   Specimen Description BLOOD RIGHT HAND  Final   Special Requests   Final    BOTTLES DRAWN AEROBIC AND ANAEROBIC Blood Culture adequate volume   Culture   Final    NO GROWTH 5 DAYS Performed at [facility], [address] [place], [place] [address]    Report Status 02/24/2017 FINAL  Final  MRSA PCR Screening     Status: None   Collection Time: 02/20/17 12:01 AM  Result Value Ref Range Status   MRSA by PCR NEGATIVE NEGATIVE Final    Comment:        The GeneXpert MRSA Assay (FDA approved for NASAL specimens only), is one component of a comprehensive MRSA colonization surveillance program. It is not intended to diagnose MRSA infection nor to guide or monitor treatment for MRSA infections. Performed at [facility], [address] [place], [place] [address]    Studies/Results: No results found.  Medications:  Prior to Admission:  Medications Prior to Admission  Medication Sig Dispense Refill Last Dose  . levothyroxine (SYNTHROID, LEVOTHROID) 125 MCG tablet take 1 tablet by mouth once daily 90 tablet 2 unknown  . warfarin (COUMADIN) 1 MG tablet Take 1 tablet ('1mg'$ ) on Tuesdays and Saturdays along with  a '5mg'$  tablet (Patient taking differently: Take 1 mg by mouth 2 (two) times a week. Take 1 tablet ('1mg'$ ) on Tuesdays and Fridays along with a '5mg'$  tablet) 30 tablet 4 unknown  . warfarin (COUMADIN) 5 MG tablet Take 1 tablet (5 mg total) by mouth daily. 90 tablet 3 unknown  . albuterol (PROVENTIL) (2.5 MG/3ML) 0.083% nebulizer solution USE 1 VIAL BY NEBULIZER EVERY 4 HOURS AS NEEDED FOR WHEEZING. DX: J44.9 375 vial 0   . budesonide (PULMICORT) 0.5 MG/2ML nebulizer solution Take 2 mLs (0.5 mg total) by nebulization 2 (two) times daily. Dx: J43.9 120 mL 3   . fentaNYL (DURAGESIC - DOSED MCG/HR) 50 MCG/HR Place 50 mcg onto the skin every 3 (three) days.   Taking  . fish oil-omega-3 fatty acids 1000 MG capsule Take 2 g by mouth daily.     Taking  . ipratropium-albuterol (DUONEB) 0.5-2.5 (3) MG/3ML SOLN 1 vial via nebulizer at breakfast, lunch, dinner and bedtime daily. May take 2 additional treatments on bad day Dx: J43.9 360 mL 3   . Magnesium 500 MG CAPS Take  500 mg by mouth.   Taking  . nitroGLYCERIN (NITROSTAT) 0.4 MG SL tablet Place 1 tablet (0.4 mg total) under the tongue every 5 (five) minutes as needed for chest pain. 25 tablet 3 Taking  . Respiratory Therapy Supplies (FLUTTER) DEVI Use as directed 1 each 0 Taking  . ticagrelor (BRILINTA) 60 MG TABS tablet Take 1 tablet (60 mg total) by mouth 2 (two) times daily. (Patient not taking: Reported on 01/24/2017) 60 tablet 11 Not Taking   Scheduled: . chlorhexidine gluconate (MEDLINE KIT)  15 mL Mouth Rinse BID  . enoxaparin (LOVENOX) injection  80 mg Subcutaneous Q12H  . free water  200 mL Per Tube Q8H  . insulin aspart  3 Units Subcutaneous Q4H  . ipratropium-albuterol  3 mL Nebulization Q6H  . levothyroxine  62.5 mcg Intravenous Daily  . mouth rinse  15 mL Mouth Rinse 10 times per day  . methylPREDNISolone (SOLU-MEDROL) injection  60 mg Intravenous Q8H   Continuous: . sodium chloride    . azithromycin Stopped (02/26/17 0152)  . famotidine  (PEPCID) IV Stopped (02/25/17 2230)  . feeding supplement (VITAL AF 1.2 CAL) 1,000 mL (02/25/17 1849)  . fentaNYL infusion INTRAVENOUS 250 mcg/hr (02/25/17 1319)  . piperacillin-tazobactam (ZOSYN)  IV 3.375 g (02/26/17 0508)   EJY:LTEIHD chloride, albuterol, LORazepam  Assesment: She was admitted with influenza A.  She has acute hypoxic and hypercapnic respiratory failure and she is been hard to get off the ventilator.  Her PCO2 went as high as 100 when she was on BiPAP and 1 of the problems that were having is that her PCO2 goes up when we take her off of ventilator support. Principal Problem:   COPD exacerbation ([place]) Active Problems:   Tachycardia   Influenza A   Acute respiratory failure ([place])    Plan: Attempt weaning again today    LOS: 7 days   [name] 02/26/2017, 7:03 AM

## 2017-02-26 NOTE — Progress Notes (Incomplete)
Dr. Luan Pulling informed about patient HR, RR and BP being elevated after extubation.  Explained that with new orders from Dr. Wynetta Emery she has improved.  Also explained that patient had moments of confusion and restless that is treated well with ativan.  Expressed possible rechecking ABG to make sure patient is not decompensating.

## 2017-02-26 NOTE — Progress Notes (Signed)
Extubated to 4lpm [place] per MD verbal order. The patient tolerated ok. HR was a little elevated at the time but has since come down. SPO2 remained mid 90s. Patient was able to verbalize after extubation. She is very anxious at this time. RT will continue to monitor.

## 2017-02-26 NOTE — Progress Notes (Signed)
[facility] PROGRESS NOTE  [name] GQQ:[id number] DOB: 1950-11-08 DOA: 02/19/2017 PCP: [name], NP  Interim summary and HPI [name]  is a 67 y.o. female, w hx of CAD, Hypothyroidism, PE, Factor V leiden, Copd on home o2, apparently c/o increase in dyspnea for 24 hours prior to admission.  Slight cough, dry-yellow sputum.  Denies fever, chills, cp, palp, n/v, diarrhea, brbpr, black stool.  Pt presents to ED due to increase in dyspnea.    CXR demonstrated acute on chronic pulmonary interstitial opacity; CTA neg for PE. Positive influenza A  Assessment/Plan: 1-acute on chronic resp failure with hypoxia and hypercapnia: in the setting of COPD exacerbation and influenza A infection . -patient initially treated with BIPAP, with very little improvement -COs increased over 100 and required ventilatory support -will follow rec's from pulmonary/critical care -continue weaning steroids, nebulizer treatment and pulmicort  -follow ABG and CXR to assess/guide weaning process -continue Tamiflu -started tube feeding 2/13 -pulmonary weaning vent.   2-tachycardia -in presentation of acute infection, albuterol treatment and anxiety -CTA neg for PE -TSH WNL -echo: EF 50-55%  3-hypothyroidism -continue synthroid  -half dose while given IV  4-GI prophylaxis -continue famotidine BID for GI protection  5-Factor V mutation and hx of PE -while intubated will use lovenox per pharmacy -resume coumadin once stable and able to tolerate PO's  Acute Pulmonary Edema - Pt received lasix IV and has diuresed.   Repeat CXR clear.   Tremors / ?seizure activity - EEG ok, consulted neurology and felt that it was related to metabolic disturbances.   Hypernatremia - added free water, recheck in AM.   Code Status: Full Family Communication: no family at bedside  Disposition Plan: remains in ICU  Consultants:  Pulmonary/critical care service  Procedures:  See below for x-ray  reports  Ventilator dependent   2-D echo:  Left ventricle: The cavity size was normal. Wall thickness was  increased in a pattern of mild LVH. Systolic function was normal.  The estimated ejection fraction was in the range of 55% to 60%.  Wall motion was normal; there were no regional wall motion  abnormalities. Features are consistent with a pseudonormal left  ventricular filling pattern, with concomitant abnormal relaxation and increased filling pressure (grade 2 diastolic dysfunction).  HPI/Subjective: Sedated and mechanically ventilated; no seizure or tremor reported. No acute changes.   Objective: Vitals:   02/26/17 0630 02/26/17 0645  BP: (!) 107/57 119/68  Pulse: 65 62  Resp: 20 20  Temp: 99 F (37.2 C) 99.1 F (37.3 C)  SpO2: 98% 99%    Intake/Output Summary (Last 24 hours) at 02/26/2017 0805 Last data filed at 02/26/2017 0600 Gross per 24 hour  Intake 3216.58 ml  Output 1020 ml  Net 2196.58 ml   Filed Weights   02/24/17 0400 02/25/17 0500 02/26/17 0500  Weight: 84.5 kg (186 lb 4.6 oz) 82.9 kg (182 lb 12.2 oz) 85.5 kg (188 lb 7.9 oz)    Exam:   General: afebrile, sedated, with ventilatory support; arousable.   Cardiovascular: S1 and S2, no rubs, no gallops  Respiratory: good air movement.   Abdomen: soft, NT, ND, positive BS  Musculoskeletal: trace pretibial edema, no cyanosis, no clubbing   Data Reviewed: Basic Metabolic Panel: Recent Labs  Lab 02/22/17 0543 02/22/17 1658 02/23/17 0516 02/23/17 1644 02/24/17 0422 02/25/17 0450 02/26/17 0523  NA 138  --  141  --  143 147* 147*  K 4.4  --  4.0  --  3.8 4.1  4.3  CL 104  --  102  --  105 106 106  CO2 26  --  29  --  29 32 32  GLUCOSE 117*  --  131*  --  149* 145* 145*  BUN 35*  --  42*  --  39* 39* 35*  CREATININE 0.99  --  1.11*  --  0.87 0.80 0.76  CALCIUM 8.6*  --  8.2*  --  8.0* 8.2* 8.4*  MG 2.1 1.9 1.9 2.0  --   --   --   PHOS 2.7 3.0 3.5 2.8  --   --   --    Liver Function Tests: Recent  Labs  Lab 02/19/17 1539 02/20/17 0619  AST 24 22  ALT 14 15  ALKPHOS 113 88  BILITOT 0.4 0.4  PROT 7.4 6.1*  ALBUMIN 3.3* 2.6*   CBC: Recent Labs  Lab 02/19/17 1539  02/22/17 0543 02/23/17 0516 02/24/17 0422 02/25/17 0450 02/26/17 0523  WBC 8.8   < > 8.4 8.3 10.5 9.3 11.4*  NEUTROABS 4.9  --   --   --   --   --   --   HGB 12.1   < > 11.1* 11.4* 11.8* 11.0* 11.1*  HCT 40.2   < > 35.9* 37.6 38.9 35.8* 35.8*  MCV 94.1   < > 93.2 93.1 93.3 93.2 92.7  PLT 219   < > 229 203 195 172 181   < > = values in this interval not displayed.   Cardiac Enzymes: Recent Labs  Lab 02/19/17 1539 02/20/17 0042 02/20/17 0619 02/20/17 1234  TROPONINI <0.03 <0.03 <0.03 <0.03   BNP (last 3 results) Recent Labs    02/19/17 1540  BNP 121.0*    Recent Results (from the past 240 hour(s))  Blood Culture (routine x 2)     Status: None   Collection Time: 02/19/17  3:39 PM  Result Value Ref Range Status   Specimen Description LEFT ANTECUBITAL  Final   Special Requests   Final    BOTTLES DRAWN AEROBIC AND ANAEROBIC Blood Culture adequate volume   Culture   Final    NO GROWTH 5 DAYS Performed at [facility], [address], [place], [place] [address]    Report Status 02/24/2017 FINAL  Final  Blood Culture (routine x 2)     Status: None   Collection Time: 02/19/17  3:44 PM  Result Value Ref Range Status   Specimen Description BLOOD RIGHT HAND  Final   Special Requests   Final    BOTTLES DRAWN AEROBIC AND ANAEROBIC Blood Culture adequate volume   Culture   Final    NO GROWTH 5 DAYS Performed at [facility], [address] [place], [place] [address]    Report Status 02/24/2017 FINAL  Final  MRSA PCR Screening     Status: None   Collection Time: 02/20/17 12:01 AM  Result Value Ref Range Status   MRSA by PCR NEGATIVE NEGATIVE Final    Comment:        The GeneXpert MRSA Assay (FDA approved for NASAL specimens only), is one component of a comprehensive MRSA colonization surveillance  program. It is not intended to diagnose MRSA infection nor to guide or monitor treatment for MRSA infections. Performed at [facility], [address], [place], [place] [address]      Studies: No results found.  Scheduled Meds: . chlorhexidine gluconate (MEDLINE KIT)  15 mL Mouth Rinse BID  . enoxaparin (LOVENOX) injection  80 mg Subcutaneous Q12H  . free water  200 mL Per Tube Q4H  . insulin aspart  3 Units Subcutaneous Q4H  . ipratropium-albuterol  3 mL Nebulization Q6H  . levothyroxine  62.5 mcg Intravenous Daily  . mouth rinse  15 mL Mouth Rinse 10 times per day  . methylPREDNISolone (SOLU-MEDROL) injection  60 mg Intravenous Q8H   Continuous Infusions: . sodium chloride    . azithromycin Stopped (02/26/17 0152)  . famotidine (PEPCID) IV Stopped (02/25/17 2230)  . feeding supplement (VITAL AF 1.2 CAL) 1,000 mL (02/25/17 1849)  . fentaNYL infusion INTRAVENOUS 250 mcg/hr (02/25/17 1319)  . piperacillin-tazobactam (ZOSYN)  IV 3.375 g (02/26/17 0508)   Principal Problem:   COPD exacerbation ([place]) Active Problems:   Tachycardia   Influenza A   Acute respiratory failure [facility])  Critical Care Time spent: 32 minutes  [facility] Pager [phone]. If 7PM-7AM, please contact night-coverage at [email or web address], password [facility] 02/26/2017, 8:05 AM  LOS: 7 days

## 2017-02-26 NOTE — Progress Notes (Signed)
Patient is resting quietly after medications and no signs of distress are noted at this time.  HR, BP, and RR all down.  Will continue to monitor patient.

## 2017-02-26 NOTE — Progress Notes (Signed)
Patient extubated to [place] at 4L sats are 95%.  Patient is alert and verbal and following commands.  No signs of distress noted at this time.  Will continue to monitor closely.

## 2017-02-26 NOTE — Progress Notes (Signed)
Began weaning at [phone] on CPAP/PSV. 10/5 35%. Pt is tolerating well at this time. RT will monitor and obtain ABG if appropriate.

## 2017-02-26 NOTE — Progress Notes (Signed)
Patient's Hr has been in the 150's and BP is elevated.  Patient is restless informed Dr. Wynetta Emery.  New orders noted. Will continue to monitor patient closely.

## 2017-02-27 ENCOUNTER — Other Ambulatory Visit: Payer: Self-pay

## 2017-02-27 ENCOUNTER — Inpatient Hospital Stay (HOSPITAL_COMMUNITY): Payer: Medicare Other

## 2017-02-27 LAB — COMPREHENSIVE METABOLIC PANEL
ALK PHOS: 69 U/L (ref 38–126)
ALT: 178 U/L — AB (ref 14–54)
AST: 80 U/L — AB (ref 15–41)
Albumin: 2.7 g/dL — ABNORMAL LOW (ref 3.5–5.0)
Anion gap: 11 (ref 5–15)
BILIRUBIN TOTAL: 1 mg/dL (ref 0.3–1.2)
BUN: 30 mg/dL — AB (ref 6–20)
CALCIUM: 8.3 mg/dL — AB (ref 8.9–10.3)
CO2: 36 mmol/L — ABNORMAL HIGH (ref 22–32)
CREATININE: 0.81 mg/dL (ref 0.44–1.00)
Chloride: 96 mmol/L — ABNORMAL LOW (ref 101–111)
Glucose, Bld: 116 mg/dL — ABNORMAL HIGH (ref 65–99)
Potassium: 3.7 mmol/L (ref 3.5–5.1)
Sodium: 143 mmol/L (ref 135–145)
TOTAL PROTEIN: 6.1 g/dL — AB (ref 6.5–8.1)

## 2017-02-27 LAB — CBC WITH DIFFERENTIAL/PLATELET
BASOS ABS: 0 10*3/uL (ref 0.0–0.1)
Basophils Relative: 0 %
Eosinophils Absolute: 0 10*3/uL (ref 0.0–0.7)
Eosinophils Relative: 0 %
HEMATOCRIT: 38.6 % (ref 36.0–46.0)
HEMOGLOBIN: 12 g/dL (ref 12.0–15.0)
LYMPHS ABS: 1 10*3/uL (ref 0.7–4.0)
LYMPHS PCT: 8 %
MCH: 28.6 pg (ref 26.0–34.0)
MCHC: 31.1 g/dL (ref 30.0–36.0)
MCV: 92.1 fL (ref 78.0–100.0)
Monocytes Absolute: 1 10*3/uL (ref 0.1–1.0)
Monocytes Relative: 8 %
NEUTROS ABS: 10.8 10*3/uL — AB (ref 1.7–7.7)
Neutrophils Relative %: 84 %
Platelets: 214 10*3/uL (ref 150–400)
RBC: 4.19 MIL/uL (ref 3.87–5.11)
RDW: 16.3 % — ABNORMAL HIGH (ref 11.5–15.5)
WBC: 12.7 10*3/uL — AB (ref 4.0–10.5)

## 2017-02-27 LAB — GLUCOSE, CAPILLARY
GLUCOSE-CAPILLARY: 100 mg/dL — AB (ref 65–99)
GLUCOSE-CAPILLARY: 110 mg/dL — AB (ref 65–99)
Glucose-Capillary: 105 mg/dL — ABNORMAL HIGH (ref 65–99)
Glucose-Capillary: 110 mg/dL — ABNORMAL HIGH (ref 65–99)
Glucose-Capillary: 105 mg/dL — ABNORMAL HIGH (ref 65–99)

## 2017-02-27 MED ORDER — FENTANYL CITRATE (PF) 100 MCG/2ML IJ SOLN
25.0000 ug | INTRAMUSCULAR | Status: AC | PRN
  Administered 2017-02-27 – 2017-02-28 (×3): 25 ug via INTRAVENOUS
  Filled 2017-02-27 (×3): qty 2

## 2017-02-27 MED ORDER — CHLORHEXIDINE GLUCONATE 0.12 % MT SOLN
15.0000 mL | Freq: Two times a day (BID) | OROMUCOSAL | Status: DC
  Administered 2017-02-27 – 2017-03-05 (×12): 15 mL via OROMUCOSAL
  Filled 2017-02-27 (×12): qty 15

## 2017-02-27 MED ORDER — FAMOTIDINE 20 MG PO TABS
20.0000 mg | ORAL_TABLET | Freq: Every day | ORAL | Status: DC
  Administered 2017-03-03 – 2017-03-05 (×3): 20 mg via ORAL
  Filled 2017-02-27 (×5): qty 1

## 2017-02-27 MED ORDER — ORAL CARE MOUTH RINSE
15.0000 mL | Freq: Two times a day (BID) | OROMUCOSAL | Status: DC
  Administered 2017-02-27 – 2017-03-04 (×10): 15 mL via OROMUCOSAL

## 2017-02-27 MED ORDER — INSULIN ASPART 100 UNIT/ML ~~LOC~~ SOLN
2.0000 [IU] | SUBCUTANEOUS | Status: DC
  Administered 2017-02-28 (×3): 2 [IU] via SUBCUTANEOUS

## 2017-02-27 NOTE — Progress Notes (Signed)
Subjective: She was successfully extubated yesterday.  She has had episodes of seeming more short of breath and tachycardic.  These do respond to Ativan.  No other new problems have been noted.  She has been on BiPAP overnight.  Objective: Vital signs in last 24 hours: Temp:  [98.6 F (37 C)-99.5 F (37.5 C)] 98.6 F (37 C) (02/19 0500) Pulse Rate:  [85-164] 101 (02/19 0500) Resp:  [12-32] 26 (02/19 0500) BP: (136-196)/(66-133) 147/76 (02/19 0500) SpO2:  [89 %-100 %] 98 % (02/19 0500) FiO2 (%):  [35 %-40 %] 35 % (02/19 0321) Weight:  [79 kg (174 lb 2.6 oz)] 79 kg (174 lb 2.6 oz) (02/19 0500) Weight change: -6.5 kg (-5.3 oz) Last BM Date: (UTA)  Intake/Output from previous day: 02/18 0701 - 02/19 0700 In: 967.7 [I.V.:67.7; NG/GT:800; IV Piggyback:100] Out: 6250 [Urine:6250]  PHYSICAL EXAM General appearance: On BiPAP.  She moves around right now is not attempting to speak. Resp: Diminished breath sounds.  End expiratory wheezes on the left Cardio: regular rate and rhythm, S1, S2 normal, no murmur, click, rub or gallop GI: soft, non-tender; bowel sounds normal; no masses,  no organomegaly Extremities: extremities normal, atraumatic, no cyanosis or edema Seems confused  Lab Results:  Results for orders placed or performed during the hospital encounter of 02/19/17 (from the past 48 hour(s))  Glucose, capillary     Status: Abnormal   Collection Time: 02/25/17  7:39 PM  Result Value Ref Range   Glucose-Capillary 133 (H) 65 - 99 mg/dL   Comment 1 Notify RN   Glucose, capillary     Status: Abnormal   Collection Time: 02/25/17 11:49 PM  Result Value Ref Range   Glucose-Capillary 132 (H) 65 - 99 mg/dL   Comment 1 Notify RN    Comment 2 Document in Chart   Blood gas, arterial     Status: Abnormal   Collection Time: 02/26/17  3:30 AM  Result Value Ref Range   FIO2 35.00    Delivery systems VENTILATOR    Mode PRESSURE REGULATED VOLUME CONTROL    VT 500 mL   LHR 20 resp/min    Peep/cpap 5.0 cm H20   pH, Arterial 7.380 7.350 - 7.450   pCO2 arterial 59.4 (H) 32.0 - 48.0 mmHg   pO2, Arterial 80.1 (L) 83.0 - 108.0 mmHg   Bicarbonate 31.6 (H) 20.0 - 28.0 mmol/L   Acid-Base Excess 9.1 (H) 0.0 - 2.0 mmol/L   O2 Saturation 94.9 %   Collection site RADIAL    Drawn by [id number]    Sample type ARTERIAL    Allens test (pass/fail) PASS PASS    Comment: Performed at [facility], [address], [place], [place] [address]  Glucose, capillary     Status: Abnormal   Collection Time: 02/26/17  4:18 AM  Result Value Ref Range   Glucose-Capillary 140 (H) 65 - 99 mg/dL   Comment 1 Notify RN   Basic metabolic panel     Status: Abnormal   Collection Time: 02/26/17  5:23 AM  Result Value Ref Range   Sodium 147 (H) 135 - 145 mmol/L   Potassium 4.3 3.5 - 5.1 mmol/L   Chloride 106 101 - 111 mmol/L   CO2 32 22 - 32 mmol/L   Glucose, Bld 145 (H) 65 - 99 mg/dL   BUN 35 (H) 6 - 20 mg/dL   Creatinine, Ser 0.76 0.44 - 1.00 mg/dL   Calcium 8.4 (L) 8.9 - 10.3 mg/dL   GFR calc  non Af Amer >60 >60 mL/min   GFR calc Af Amer >60 >60 mL/min    Comment: (NOTE) The eGFR has been calculated using the CKD EPI equation. This calculation has not been validated in all clinical situations. eGFR's persistently <60 mL/min signify possible Chronic Kidney Disease.    Anion gap 9 5 - 15    Comment: Performed at [facility], [address] [place], [place] [address]  CBC     Status: Abnormal   Collection Time: 02/26/17  5:23 AM  Result Value Ref Range   WBC 11.4 (H) 4.0 - 10.5 K/uL   RBC 3.86 (L) 3.87 - 5.11 MIL/uL   Hemoglobin 11.1 (L) 12.0 - 15.0 g/dL   HCT 35.8 (L) 36.0 - 46.0 %   MCV 92.7 78.0 - 100.0 fL   MCH 28.8 26.0 - 34.0 pg   MCHC 31.0 30.0 - 36.0 g/dL   RDW 17.0 (H) 11.5 - 15.5 %   Platelets 181 150 - 400 K/uL    Comment: Performed at [facility], [address], [place], [place] [address]  Glucose, capillary     Status: Abnormal   Collection Time: 02/26/17  8:02 AM  Result Value  Ref Range   Glucose-Capillary 130 (H) 65 - 99 mg/dL  Blood gas, arterial     Status: Abnormal   Collection Time: 02/26/17 10:26 AM  Result Value Ref Range   FIO2 35.00    Delivery systems VENTILATOR    Mode PRESSURE SUPPORT    Peep/cpap 5.0 cm H20   Pressure support 10.0 cm H20   pH, Arterial 7.360 7.350 - 7.450   pCO2 arterial 63.2 (H) 32.0 - 48.0 mmHg   pO2, Arterial 83.9 83.0 - 108.0 mmHg   Bicarbonate 31.5 (H) 20.0 - 28.0 mmol/L   Acid-Base Excess 9.1 (H) 0.0 - 2.0 mmol/L   O2 Saturation 95.0 %   Patient temperature 37.3    Collection site RIGHT RADIAL    Drawn by [id number]    Sample type ARTERIAL DRAW    Allens test (pass/fail) PASS PASS    Comment: Performed at [facility], [address], [place], [place] [address]  Glucose, capillary     Status: Abnormal   Collection Time: 02/26/17 11:12 AM  Result Value Ref Range   Glucose-Capillary 129 (H) 65 - 99 mg/dL  Glucose, capillary     Status: Abnormal   Collection Time: 02/26/17  5:17 PM  Result Value Ref Range   Glucose-Capillary 123 (H) 65 - 99 mg/dL  Blood gas, arterial     Status: Abnormal   Collection Time: 02/26/17  6:00 PM  Result Value Ref Range   O2 Content 4.0 L/min   Delivery systems NASAL CANNULA    pH, Arterial 7.39 7.350 - 7.450   pCO2 arterial 65.7 (HH) 32.0 - 48.0 mmHg    Comment: CRITICAL RESULT CALLED TO, READ BACK BY AND VERIFIED WITH: LEANDER,RN BY J.ROWLAND,RRT AT 1813 ON 02/26/17    pO2, Arterial 71.4 (L) 83.0 - 108.0 mmHg   Bicarbonate 35.7 (H) 20.0 - 28.0 mmol/L   Acid-Base Excess 13.8 (H) 0.0 - 2.0 mmol/L   O2 Saturation 93.6 %   Patient temperature 37.3    Collection site RIGHT RADIAL    Drawn by [id number]    Sample type ARTERIAL DRAW    Allens test (pass/fail) PASS PASS    Comment: Performed at [facility], [address], [place], [place] [address]  Glucose, capillary     Status: Abnormal  Collection Time: 02/26/17  8:10 PM  Result Value Ref Range   Glucose-Capillary 107 (H) 65 - 99 mg/dL   Glucose, capillary     Status: Abnormal   Collection Time: 02/26/17 11:11 PM  Result Value Ref Range   Glucose-Capillary 113 (H) 65 - 99 mg/dL  Glucose, capillary     Status: Abnormal   Collection Time: 02/27/17  3:47 AM  Result Value Ref Range   Glucose-Capillary 100 (H) 65 - 99 mg/dL  CBC with Differential/Platelet     Status: Abnormal   Collection Time: 02/27/17  4:16 AM  Result Value Ref Range   WBC 12.7 (H) 4.0 - 10.5 K/uL    Comment: INCREASED BANDS (>20% BANDS) WHITE COUNT CONFIRMED ON SMEAR    RBC 4.19 3.87 - 5.11 MIL/uL   Hemoglobin 12.0 12.0 - 15.0 g/dL   HCT 38.6 36.0 - 46.0 %   MCV 92.1 78.0 - 100.0 fL   MCH 28.6 26.0 - 34.0 pg   MCHC 31.1 30.0 - 36.0 g/dL   RDW 16.3 (H) 11.5 - 15.5 %   Platelets 214 150 - 400 K/uL   Neutrophils Relative % 84 %   Neutro Abs 10.8 (H) 1.7 - 7.7 K/uL   Lymphocytes Relative 8 %   Lymphs Abs 1.0 0.7 - 4.0 K/uL   Monocytes Relative 8 %   Monocytes Absolute 1.0 0.1 - 1.0 K/uL   Eosinophils Relative 0 %   Eosinophils Absolute 0.0 0.0 - 0.7 K/uL   Basophils Relative 0 %   Basophils Absolute 0.0 0.0 - 0.1 K/uL    Comment: Performed at [facility], [address], [place], [place] [address]  Comprehensive metabolic panel     Status: Abnormal   Collection Time: 02/27/17  4:16 AM  Result Value Ref Range   Sodium 143 135 - 145 mmol/L   Potassium 3.7 3.5 - 5.1 mmol/L   Chloride 96 (L) 101 - 111 mmol/L   CO2 36 (H) 22 - 32 mmol/L   Glucose, Bld 116 (H) 65 - 99 mg/dL   BUN 30 (H) 6 - 20 mg/dL   Creatinine, Ser 0.81 0.44 - 1.00 mg/dL   Calcium 8.3 (L) 8.9 - 10.3 mg/dL   Total Protein 6.1 (L) 6.5 - 8.1 g/dL   Albumin 2.7 (L) 3.5 - 5.0 g/dL   AST 80 (H) 15 - 41 U/L   ALT 178 (H) 14 - 54 U/L   Alkaline Phosphatase 69 38 - 126 U/L   Total Bilirubin 1.0 0.3 - 1.2 mg/dL   GFR calc non Af Amer >60 >60 mL/min   GFR calc Af Amer >60 >60 mL/min    Comment: (NOTE) The eGFR has been calculated using the CKD EPI equation. This calculation has  not been validated in all clinical situations. eGFR's persistently <60 mL/min signify possible Chronic Kidney Disease.    Anion gap 11 5 - 15    Comment: Performed at [facility], [address] [place], [place] [address]    ABGS Recent Labs    02/26/17 1800  PHART 7.39  PO2ART 71.4*  HCO3 35.7*   CULTURES Recent Results (from the past 240 hour(s))  Blood Culture (routine x 2)     Status: None   Collection Time: 02/19/17  3:39 PM  Result Value Ref Range Status   Specimen Description LEFT ANTECUBITAL  Final   Special Requests   Final    BOTTLES DRAWN AEROBIC AND ANAEROBIC Blood Culture adequate volume   Culture  Final    NO GROWTH 5 DAYS Performed at [facility], [address], [place], [place] [address]    Report Status 02/24/2017 FINAL  Final  Blood Culture (routine x 2)     Status: None   Collection Time: 02/19/17  3:44 PM  Result Value Ref Range Status   Specimen Description BLOOD RIGHT HAND  Final   Special Requests   Final    BOTTLES DRAWN AEROBIC AND ANAEROBIC Blood Culture adequate volume   Culture   Final    NO GROWTH 5 DAYS Performed at [facility], [address] [place], [place] [address]    Report Status 02/24/2017 FINAL  Final  MRSA PCR Screening     Status: None   Collection Time: 02/20/17 12:01 AM  Result Value Ref Range Status   MRSA by PCR NEGATIVE NEGATIVE Final    Comment:        The GeneXpert MRSA Assay (FDA approved for NASAL specimens only), is one component of a comprehensive MRSA colonization surveillance program. It is not intended to diagnose MRSA infection nor to guide or monitor treatment for MRSA infections. Performed at [facility], [address], [place], [place] [address]    Studies/Results: Dg Chest Port 1 View  Result Date: 02/26/2017 CLINICAL DATA:  Status postextubation.  Shortness of Breath EXAM: PORTABLE CHEST 1 VIEW COMPARISON:  Study obtained earlier in the day FINDINGS: Endotracheal tube and nasogastric tube  have been removed. No pneumothorax. There is a small right pleural effusion. There is a minimal left pleural effusion with mild left base atelectasis. There is generalized interstitial edema. No consolidation. Heart is mildly enlarged with pulmonary venous hypertension. No adenopathy. No bone lesions. IMPRESSION: No pneumothorax. Findings felt to represent a degree of congestive heart failure. Mild left base atelectasis. Electronically Signed   By: Lowella Grip III M.D.   On: 02/26/2017 12:36   Dg Chest Port 1 View  Result Date: 02/26/2017 CLINICAL DATA:  Hypoxia EXAM: PORTABLE CHEST 1 VIEW COMPARISON:  March 06, 2017 FINDINGS: Endotracheal tube tip is 6.0 cm above the carina. Nasogastric tube tip and side port are below the diaphragm. No evident pneumothorax. There is a small right pleural effusion. There is mild bibasilar atelectasis. There is mild cardiomegaly with pulmonary venous hypertension. There is aortic atherosclerosis. No adenopathy. No bone lesions. IMPRESSION: Tube positions as described without pneumothorax. There is pulmonary vascular congestion with small right pleural effusion. Bibasilar atelectasis. No frank edema or consolidation. There is aortic atherosclerosis. Aortic Atherosclerosis (ICD10-I70.0). Electronically Signed   By: Lowella Grip III M.D.   On: 02/26/2017 08:20    Medications:  Prior to Admission:  Medications Prior to Admission  Medication Sig Dispense Refill Last Dose  . levothyroxine (SYNTHROID, LEVOTHROID) 125 MCG tablet take 1 tablet by mouth once daily 90 tablet 2 unknown  . warfarin (COUMADIN) 1 MG tablet Take 1 tablet (62m) on Tuesdays and Saturdays along with a 575mtablet (Patient taking differently: Take 1 mg by mouth 2 (two) times a week. Take 1 tablet (19m89mon Tuesdays and Fridays along with a 5mg719mblet) 30 tablet 4 unknown  . warfarin (COUMADIN) 5 MG tablet Take 1 tablet (5 mg total) by mouth daily. 90 tablet 3 unknown  . albuterol (PROVENTIL)  (2.5 MG/3ML) 0.083% nebulizer solution USE 1 VIAL BY NEBULIZER EVERY 4 HOURS AS NEEDED FOR WHEEZING. DX: J44.9 375 vial 0   . budesonide (PULMICORT) 0.5 MG/2ML nebulizer solution Take 2 mLs (0.5 mg total) by nebulization 2 (  two) times daily. Dx: J43.9 120 mL 3   . fentaNYL (DURAGESIC - DOSED MCG/HR) 50 MCG/HR Place 50 mcg onto the skin every 3 (three) days.   Taking  . fish oil-omega-3 fatty acids 1000 MG capsule Take 2 g by mouth daily.     Taking  . ipratropium-albuterol (DUONEB) 0.5-2.5 (3) MG/3ML SOLN 1 vial via nebulizer at breakfast, lunch, dinner and bedtime daily. May take 2 additional treatments on bad day Dx: J43.9 360 mL 3   . Magnesium 500 MG CAPS Take 500 mg by mouth.   Taking  . nitroGLYCERIN (NITROSTAT) 0.4 MG SL tablet Place 1 tablet (0.4 mg total) under the tongue every 5 (five) minutes as needed for chest pain. 25 tablet 3 Taking  . Respiratory Therapy Supplies (FLUTTER) DEVI Use as directed 1 each 0 Taking  . ticagrelor (BRILINTA) 60 MG TABS tablet Take 1 tablet (60 mg total) by mouth 2 (two) times daily. (Patient not taking: Reported on 01/24/2017) 60 tablet 11 Not Taking   Scheduled: . chlorhexidine  15 mL Mouth Rinse BID  . enoxaparin (LOVENOX) injection  80 mg Subcutaneous Q12H  . insulin aspart  3 Units Subcutaneous Q4H  . ipratropium-albuterol  3 mL Nebulization Q6H  . levothyroxine  62.5 mcg Intravenous Daily  . mouth rinse  15 mL Mouth Rinse q12n4p  . methylPREDNISolone (SOLU-MEDROL) injection  60 mg Intravenous Q8H   Continuous: . sodium chloride    . famotidine (PEPCID) IV Stopped (02/26/17 2202)  . feeding supplement (VITAL AF 1.2 CAL) 1,000 mL (02/25/17 1849)  . fentaNYL infusion INTRAVENOUS Stopped (02/26/17 1125)  . piperacillin-tazobactam (ZOSYN)  IV 3.375 g (02/27/17 0533)   BTD:VVOHYW chloride, albuterol, LORazepam, metoprolol tartrate  Assesment: She was admitted with influenza A and COPD exacerbation.  She was initially treated with BiPAP for acute  respiratory failure but her PCO2 increased despite BiPAP and she eventually was intubated and placed on the.  She was extubated yesterday and has done fairly well.  She is back on BiPAP now but I think she will be able to come off during the day but may require BiPAP at night  She was septic and is on antibiotics  She has metabolic encephalopathy.  I have not seen her prior to this admission and I do not think she is been in the hospital with any of the caretakers who are involved with her care now so we do not know what her baseline is. Principal Problem:   COPD exacerbation ([place]) Active Problems:   Tachycardia   Influenza A   Acute respiratory failure ([place])    Plan: Continue treatments.  Still at risk of having to be reintubated but she seems to be doing at least fairly well right now.    LOS: 8 days   Brently Voorhis L 02/27/2017, 7:46 AM

## 2017-02-27 NOTE — Progress Notes (Signed)
ANTICOAGULATION CONSULT NOTE -  Consult  Pharmacy Consult for Warfarin >> LOVENOX Indication: Chronic warfarin, Hx PE/DVT/+ factor V Leiden  Allergies  Allergen Reactions  . Dilaudid [Hydromorphone Hcl] Hives and Nausea Only  . Minocycline Hcl     REACTION: Dizzy  . Prednisone     REACTION: feels like throat swelling  . Varenicline Tartrate     REACTION: Dizzy(chantix)   . Zocor [Simvastatin - High Dose] Other (See Comments)    myalgia   Patient Measurements: Height: 5\' 5"  (165.1 cm) Weight: 174 lb 2.6 oz (79 kg) IBW/kg (Calculated) : 57  Vital Signs: Temp: 98.1 F (36.7 C) (02/19 0900) BP: 151/84 (02/19 0900) Pulse Rate: 105 (02/19 0900)  Labs: Recent Labs    02/25/17 0450 02/26/17 0523 02/27/17 0416  HGB 11.0* 11.1* 12.0  HCT 35.8* 35.8* 38.6  PLT 172 181 214  CREATININE 0.80 0.76 0.81   Estimated Creatinine Clearance: 71 mL/min (by C-G formula based on SCr of 0.81 mg/dL).  Medical History: Past Medical History:  Diagnosis Date  . Acute MI ([place])    x4, code blue x3  . Arteriosclerotic cardiovascular disease (ASCVD) 2002   Inf STEMI-2002. 2003-cutting balloon + brachytherapy for restenosis; subsequent acute stent thrombosis 06/2010 requiring 2 separate interventions (Zeta stent, then repeat cath with thrombectomy). focal basal inf AK, nl EF; 03/2011: Patent stents, minor nonobst  residual dz, nl EF; neg stress nuclear in 2008 and stress echo in 2009  . Chronic anticoagulation    Warfarin plus ticagrelor  . Chronic respiratory failure ([place]) 08/27/2013   On 2L 02  . COPD (chronic obstructive pulmonary disease) ([place])    02 dependent  . Factor V Leiden, prothrombin gene mutation ([place]) 2006  . Hyperlipidemia   . Hypothyroidism   . Noncompliance   . Pelvic fracture ([place]) 2009  . Pulmonary embolism ([place]) 2006   Associated with deep vein thrombosis-2006; + factor V Leiden  . Tobacco abuse    50 pack years   Medications:  Medications Prior to Admission   Medication Sig Dispense Refill Last Dose  . levothyroxine (SYNTHROID, LEVOTHROID) 125 MCG tablet take 1 tablet by mouth once daily 90 tablet 2 unknown  . warfarin (COUMADIN) 1 MG tablet Take 1 tablet (1mg ) on Tuesdays and Saturdays along with a 5mg  tablet (Patient taking differently: Take 1 mg by mouth 2 (two) times a week. Take 1 tablet (1mg ) on Tuesdays and Fridays along with a 5mg  tablet) 30 tablet 4 unknown  . warfarin (COUMADIN) 5 MG tablet Take 1 tablet (5 mg total) by mouth daily. 90 tablet 3 unknown  . albuterol (PROVENTIL) (2.5 MG/3ML) 0.083% nebulizer solution USE 1 VIAL BY NEBULIZER EVERY 4 HOURS AS NEEDED FOR WHEEZING. DX: J44.9 375 vial 0   . budesonide (PULMICORT) 0.5 MG/2ML nebulizer solution Take 2 mLs (0.5 mg total) by nebulization 2 (two) times daily. Dx: J43.9 120 mL 3   . fentaNYL (DURAGESIC - DOSED MCG/HR) 50 MCG/HR Place 50 mcg onto the skin every 3 (three) days.   Taking  . fish oil-omega-3 fatty acids 1000 MG capsule Take 2 g by mouth daily.     Taking  . ipratropium-albuterol (DUONEB) 0.5-2.5 (3) MG/3ML SOLN 1 vial via nebulizer at breakfast, lunch, dinner and bedtime daily. May take 2 additional treatments on bad day Dx: J43.9 360 mL 3   . Magnesium 500 MG CAPS Take 500 mg by mouth.   Taking  . nitroGLYCERIN (NITROSTAT) 0.4 MG SL tablet Place 1 tablet (0.4 mg  total) under the tongue every 5 (five) minutes as needed for chest pain. 25 tablet 3 Taking  . Respiratory Therapy Supplies (FLUTTER) DEVI Use as directed 1 each 0 Taking  . ticagrelor (BRILINTA) 60 MG TABS tablet Take 1 tablet (60 mg total) by mouth 2 (two) times daily. (Patient not taking: Reported on 01/24/2017) 60 tablet 11 Not Taking    Home warfarin 5mg  daily except 6mg  Tuesday, Thursday and Saturdays per recent ambulatory anticoag flow sheet.    Assessment: Pt in ICU now extubated but requiring bipap and potentially may require reintubation.   Was transitioned to lovenox for anticoagulation full dose.   Goal  of Therapy:  INR 2-3 when on Coumadin   Plan:  Continue Lovenox 1mg /Kg SQ q12hrs F/U transition and restart of coumadin Monitor for CBC, signs and symptoms of bleeding.   Trenton Gammon, Torryn Fiske L 02/27/2017,11:44 AM

## 2017-02-27 NOTE — Progress Notes (Signed)
Patient taken off bipap at this time and placed on 4lpm [place]. No issues. Patient doing well.

## 2017-02-27 NOTE — Progress Notes (Signed)
[facility] PROGRESS NOTE  [name] IDP:[id number] DOB: March 19, 1950 DOA: 02/19/2017 PCP: [name], NP  Interim summary and HPI [name]  is a 67 y.o. female, w hx of CAD, Hypothyroidism, PE, Factor V leiden, Copd on home o2, apparently c/o increase in dyspnea for 24 hours prior to admission.  Slight cough, dry-yellow sputum.  Denies fever, chills, cp, palp, n/v, diarrhea, brbpr, black stool.  Pt presents to ED due to increase in dyspnea.    CXR demonstrated acute on chronic pulmonary interstitial opacity; CTA neg for PE. Positive influenza A  Assessment/Plan: 1-acute on chronic resp failure with hypoxia and hypercapnia: in the setting of COPD exacerbation and influenza A infection . -patient initially treated with BIPAP, with very little improvement -COs increased over 100 and required ventilatory support -will follow rec's from pulmonary/critical care -continue weaning steroids, nebulizer treatment and pulmicort  -follow ABG and CXR to assess/guide weaning process -continue Tamiflu -started tube feeding 2/13 -extubated 2/18, and has been on intermittent bipap, high risk for reintubation, follow in ICU   2-tachycardia -in presentation of acute infection, albuterol treatment and anxiety -CTA neg for PE -TSH WNL -echo: EF 50-55%  3-hypothyroidism -continue synthroid  -half dose while given IV  4-GI prophylaxis -continue famotidine BID for GI protection  5-Factor V mutation and hx of PE -continue lovenox per pharmacy -resume coumadin once stable and able to tolerate PO's  Acute Pulmonary Edema - Pt received lasix IV and has diuresed.   Repeat CXR shows improvement.   Tremors / ?seizure activity - EEG ok, consulted neurology and felt that it was related to metabolic disturbances.   Hypernatremia - added free water, recheck in AM.   Code Status: Full Family Communication: no family at bedside  Disposition Plan: remains in  ICU  Consultants:  Pulmonary/critical care service  Procedures:  See below for x-ray reports  Ventilator dependent   2-D echo:  Left ventricle: The cavity size was normal. Wall thickness was  increased in a pattern of mild LVH. Systolic function was normal.  The estimated ejection fraction was in the range of 55% to 60%.  Wall motion was normal; there were no regional wall motion  abnormalities. Features are consistent with a pseudonormal left  ventricular filling pattern, with concomitant abnormal relaxation and increased filling pressure (grade 2 diastolic dysfunction).  HPI/Subjective: Pt has ICU delirium.  She is extubated but remains on bipap and high risk for reintubation.     Objective: Vitals:   02/27/17 0800 02/27/17 0900  BP: (!) 165/73 (!) 151/84  Pulse: 94 (!) 105  Resp: (!) 31 (!) 29  Temp: 98.2 F (36.8 C) 98.1 F (36.7 C)  SpO2: 97% 93%    Intake/Output Summary (Last 24 hours) at 02/27/2017 1210 Last data filed at 02/27/2017 0500 Gross per 24 hour  Intake 967.68 ml  Output 6250 ml  Net -5282.32 ml   Filed Weights   02/25/17 0500 02/26/17 0500 02/27/17 0500  Weight: 82.9 kg (182 lb 12.2 oz) 85.5 kg (188 lb 7.9 oz) 79 kg (174 lb 2.6 oz)    Exam:   General: afebrile, sedated, with ventilatory support; arousable.   Cardiovascular: S1 and S2, no rubs, no gallops  Respiratory: good air movement.   Abdomen: soft, NT, ND, positive BS  Musculoskeletal: trace pretibial edema, no cyanosis, no clubbing   Data Reviewed: Basic Metabolic Panel: Recent Labs  Lab 02/22/17 0543 02/22/17 1658 02/23/17 0516 02/23/17 1644 02/24/17 0422 02/25/17 0450 02/26/17 0523 02/27/17 0416  NA  138  --  141  --  143 147* 147* 143  K 4.4  --  4.0  --  3.8 4.1 4.3 3.7  CL 104  --  102  --  105 106 106 96*  CO2 26  --  29  --  29 32 32 36*  GLUCOSE 117*  --  131*  --  149* 145* 145* 116*  BUN 35*  --  42*  --  39* 39* 35* 30*  CREATININE 0.99  --  1.11*  --  0.87 0.80  0.76 0.81  CALCIUM 8.6*  --  8.2*  --  8.0* 8.2* 8.4* 8.3*  MG 2.1 1.9 1.9 2.0  --   --   --   --   PHOS 2.7 3.0 3.5 2.8  --   --   --   --    Liver Function Tests: Recent Labs  Lab 02/27/17 0416  AST 80*  ALT 178*  ALKPHOS 69  BILITOT 1.0  PROT 6.1*  ALBUMIN 2.7*   CBC: Recent Labs  Lab 02/23/17 0516 02/24/17 0422 02/25/17 0450 02/26/17 0523 02/27/17 0416  WBC 8.3 10.5 9.3 11.4* 12.7*  NEUTROABS  --   --   --   --  10.8*  HGB 11.4* 11.8* 11.0* 11.1* 12.0  HCT 37.6 38.9 35.8* 35.8* 38.6  MCV 93.1 93.3 93.2 92.7 92.1  PLT 203 195 172 181 214   Cardiac Enzymes: Recent Labs  Lab 02/20/17 1234  TROPONINI <0.03   BNP (last 3 results) Recent Labs    02/19/17 1540  BNP 121.0*    Recent Results (from the past 240 hour(s))  Blood Culture (routine x 2)     Status: None   Collection Time: 02/19/17  3:39 PM  Result Value Ref Range Status   Specimen Description LEFT ANTECUBITAL  Final   Special Requests   Final    BOTTLES DRAWN AEROBIC AND ANAEROBIC Blood Culture adequate volume   Culture   Final    NO GROWTH 5 DAYS Performed at [facility], [address], [place], [place] [address]    Report Status 02/24/2017 FINAL  Final  Blood Culture (routine x 2)     Status: None   Collection Time: 02/19/17  3:44 PM  Result Value Ref Range Status   Specimen Description BLOOD RIGHT HAND  Final   Special Requests   Final    BOTTLES DRAWN AEROBIC AND ANAEROBIC Blood Culture adequate volume   Culture   Final    NO GROWTH 5 DAYS Performed at [facility], [address] [place], [place] [address]    Report Status 02/24/2017 FINAL  Final  MRSA PCR Screening     Status: None   Collection Time: 02/20/17 12:01 AM  Result Value Ref Range Status   MRSA by PCR NEGATIVE NEGATIVE Final    Comment:        The GeneXpert MRSA Assay (FDA approved for NASAL specimens only), is one component of a comprehensive MRSA colonization surveillance program. It is not intended to diagnose  MRSA infection nor to guide or monitor treatment for MRSA infections. Performed at [facility], [address], [place], [place] [address]      Studies: Dg Chest [facility] 1 View  Result Date: 02/27/2017 CLINICAL DATA:  Respiratory failure, COPD EXAM: PORTABLE CHEST 1 VIEW COMPARISON:  02/26/2017 FINDINGS: There is hyperinflation of the lungs compatible with COPD. Cardiomegaly. Interstitial prominence has improved slightly. Mild residual interstitial edema suspected. Small bilateral effusions. Vascular  congestion. IMPRESSION: COPD. Continued interstitial prominence likely interstitial edema, improved since prior study. Small effusions. Electronically Signed   By: Rolm Baptise M.D.   On: 02/27/2017 07:57   Dg Chest Port 1 View  Result Date: 02/26/2017 CLINICAL DATA:  Status postextubation.  Shortness of Breath EXAM: PORTABLE CHEST 1 VIEW COMPARISON:  Study obtained earlier in the day FINDINGS: Endotracheal tube and nasogastric tube have been removed. No pneumothorax. There is a small right pleural effusion. There is a minimal left pleural effusion with mild left base atelectasis. There is generalized interstitial edema. No consolidation. Heart is mildly enlarged with pulmonary venous hypertension. No adenopathy. No bone lesions. IMPRESSION: No pneumothorax. Findings felt to represent a degree of congestive heart failure. Mild left base atelectasis. Electronically Signed   By: Lowella Grip III M.D.   On: 02/26/2017 12:36   Dg Chest Port 1 View  Result Date: 02/26/2017 CLINICAL DATA:  Hypoxia EXAM: PORTABLE CHEST 1 VIEW COMPARISON:  March 06, 2017 FINDINGS: Endotracheal tube tip is 6.0 cm above the carina. Nasogastric tube tip and side port are below the diaphragm. No evident pneumothorax. There is a small right pleural effusion. There is mild bibasilar atelectasis. There is mild cardiomegaly with pulmonary venous hypertension. There is aortic atherosclerosis. No adenopathy. No bone lesions.  IMPRESSION: Tube positions as described without pneumothorax. There is pulmonary vascular congestion with small right pleural effusion. Bibasilar atelectasis. No frank edema or consolidation. There is aortic atherosclerosis. Aortic Atherosclerosis (ICD10-I70.0). Electronically Signed   By: Lowella Grip III M.D.   On: 02/26/2017 08:20   Scheduled Meds: . chlorhexidine  15 mL Mouth Rinse BID  . enoxaparin (LOVENOX) injection  80 mg Subcutaneous Q12H  . famotidine  20 mg Oral Daily  . insulin aspart  2 Units Subcutaneous Q4H  . ipratropium-albuterol  3 mL Nebulization Q6H  . levothyroxine  62.5 mcg Intravenous Daily  . mouth rinse  15 mL Mouth Rinse q12n4p  . methylPREDNISolone (SOLU-MEDROL) injection  60 mg Intravenous Q8H   Continuous Infusions: . sodium chloride    . feeding supplement (VITAL AF 1.2 CAL) 1,000 mL (02/25/17 1849)  . piperacillin-tazobactam (ZOSYN)  IV Stopped (02/27/17 0930)   Principal Problem:   COPD exacerbation ([place]) Active Problems:   Tachycardia   Influenza A   Acute respiratory failure [facility])  Critical Care Time spent: 33 minutes  [facility] Pager [phone]. If 7PM-7AM, please contact night-coverage at [email or web address], password [facility] 02/27/2017, 12:10 PM  LOS: 8 days

## 2017-02-27 NOTE — Evaluation (Signed)
Clinical/Bedside Swallow Evaluation Patient Details  Name: [name] MRN: [id number] Date of Birth: 1950-06-25  Today's Date: 02/27/2017 Time: SLP Start Time (ACUTE ONLY): 4098 SLP Stop Time (ACUTE ONLY): 1730 SLP Time Calculation (min) (ACUTE ONLY): 16 min  Past Medical History:  Past Medical History:  Diagnosis Date  . Acute MI ([place])    x4, code blue x3  . Arteriosclerotic cardiovascular disease (ASCVD) 2002   Inf STEMI-2002. 2003-cutting balloon + brachytherapy for restenosis; subsequent acute stent thrombosis 06/2010 requiring 2 separate interventions (Zeta stent, then repeat cath with thrombectomy). focal basal inf AK, nl EF; 03/2011: Patent stents, minor nonobst  residual dz, nl EF; neg stress nuclear in 2008 and stress echo in 2009  . Chronic anticoagulation    Warfarin plus ticagrelor  . Chronic respiratory failure ([place]) 08/27/2013   On 2L 02  . COPD (chronic obstructive pulmonary disease) ([place])    02 dependent  . Factor V Leiden, prothrombin gene mutation ([place]) 2006  . Hyperlipidemia   . Hypothyroidism   . Noncompliance   . Pelvic fracture ([place]) 2009  . Pulmonary embolism ([place]) 2006   Associated with deep vein thrombosis-2006; + factor V Leiden  . Tobacco abuse    50 pack years   Past Surgical History:  Past Surgical History:  Procedure Laterality Date  . COLONOSCOPY  Approximately 2000   Negative screening study  . CORONARY ANGIOPLASTY  2002, 2003, 2012  . LEFT AND RIGHT HEART CATHETERIZATION WITH CORONARY ANGIOGRAM N/A 04/03/2011   Procedure: LEFT AND RIGHT HEART CATHETERIZATION WITH CORONARY ANGIOGRAM;  Surgeon: Sherren Mocha, MD;  Location[facility] C[facility] LAB;  Service: Cardiovascular;  Laterality: N/A;   HPI:  [name]  is a 67 y.o. female, w hx of CAD, Hypothyroidism, PE, Factor V leiden, Copd on home o2, apparently c/o increase in dyspnea for 24 hours prior to admission.  Slight cough, dry-yellow sputum.  Denies fever, chills, cp, palp, n/v, diarrhea, brbpr,  black stool.  Pt presents to ED due to increase in dyspnea.   Assessment / Plan / Recommendation Clinical Impression  Clinical swallow evaluation completed, however Pt with reduced level of alertness (had Ativan two hours ago). Oral motor structures appear WNL. Pt with strong cough. She makes attempts to suck on ice chip which elicits lingual pumping with eventual palpated swallow. Pt is not alert encough for additional po at this time. SLP will follow up in the AM.  SLP Visit Diagnosis: Dysphagia, oropharyngeal phase (R13.12)    Aspiration Risk  Moderate aspiration risk;Risk for inadequate nutrition/hydration    Diet Recommendation NPO   Medication Administration: Via alternative means    Other  Recommendations Oral Care Recommendations: Oral care QID   Follow up Recommendations        Frequency and Duration min 2x/week  1 week       Prognosis Prognosis for Safe Diet Advancement: Guarded Barriers to Reach Goals: Behavior;Medication      Swallow Study   General Date of Onset: 02/19/17 HPI: [name]  is a 67 y.o. female, w hx of CAD, Hypothyroidism, PE, Factor V leiden, Copd on home o2, apparently c/o increase in dyspnea for 24 hours prior to admission.  Slight cough, dry-yellow sputum.  Denies fever, chills, cp, palp, n/v, diarrhea, brbpr, black stool.  Pt presents to ED due to increase in dyspnea. Type of Study: Bedside Swallow Evaluation Previous Swallow Assessment: none on record Diet Prior to this Study: NPO Temperature Spikes Noted: No Respiratory Status: Nasal cannula History of Recent  Intubation: No Behavior/Cognition: Lethargic/Drowsy;Requires cueing Oral Cavity Assessment: Within Functional Limits Oral Care Completed by SLP: Yes Oral Cavity - Dentition: Edentulous Vision: Impaired for self-feeding Self-Feeding Abilities: Total assist Patient Positioning: Upright in bed Baseline Vocal Quality: Normal(minimal verbalizations) Volitional Cough:  Strong Volitional Swallow: Unable to elicit    Oral/Motor/Sensory Function Overall Oral Motor/Sensory Function: Within functional limits(appears WFL)   Ice Chips Ice chips: Impaired Oral Phase Impairments: Reduced labial seal;Reduced lingual movement/coordination Oral Phase Functional Implications: Prolonged oral transit Pharyngeal Phase Impairments: Suspected delayed Swallow;Decreased hyoid-laryngeal movement(lingual pumping, eventual palpated swallow)   Thin Liquid Thin Liquid: Not tested    Nectar Thick Nectar Thick Liquid: Not tested   Honey Thick Honey Thick Liquid: Not tested   Puree Puree: Not tested   Solid   Thank you,  Genene Churn, CCC-SLP [phone]    Solid: Not tested        PORTER,DABNEY 02/27/2017,5:35 PM

## 2017-02-27 NOTE — Progress Notes (Signed)
252ml of fentanyl wasted in sink, [employer] account executive witness waste.

## 2017-02-27 NOTE — Progress Notes (Deleted)
Cardiology Office Note    Date:  02/27/2017   ID:  [name], DOB [birth year], MRN [id number]  PCP:  [name], NP  Cardiologist: No primary care provider on file.  No chief complaint on file.   History of Present Illness:  [name] is a 67 y.o. female history of CAD status post PCI of the RCA with in-stent restenosis and in-stent thrombosis on Brilinta, history of PE with factor V Leiden deficiency on Coumadin, PAF.  Also has COPD, tobacco abuse and obesity.  Last seen in our office 11/2015.    Past Medical History:  Diagnosis Date  . Acute MI ([place])    x4, code blue x3  . Arteriosclerotic cardiovascular disease (ASCVD) 2002   Inf STEMI-2002. 2003-cutting balloon + brachytherapy for restenosis; subsequent acute stent thrombosis 06/2010 requiring 2 separate interventions (Zeta stent, then repeat cath with thrombectomy). focal basal inf AK, nl EF; 03/2011: Patent stents, minor nonobst  residual dz, nl EF; neg stress nuclear in 2008 and stress echo in 2009  . Chronic anticoagulation    Warfarin plus ticagrelor  . Chronic respiratory failure ([place]) 08/27/2013   On 2L 02  . COPD (chronic obstructive pulmonary disease) ([place])    02 dependent  . Factor V Leiden, prothrombin gene mutation ([place]) 2006  . Hyperlipidemia   . Hypothyroidism   . Noncompliance   . Pelvic fracture ([place]) 2009  . Pulmonary embolism ([place]) 2006   Associated with deep vein thrombosis-2006; + factor V Leiden  . Tobacco abuse    50 pack years    Past Surgical History:  Procedure Laterality Date  . COLONOSCOPY  Approximately 2000   Negative screening study  . CORONARY ANGIOPLASTY  2002, 2003, 2012  . LEFT AND RIGHT HEART CATHETERIZATION WITH CORONARY ANGIOGRAM N/A 04/03/2011   Procedure: LEFT AND RIGHT HEART CATHETERIZATION WITH CORONARY ANGIOGRAM;  Surgeon: Sherren Mocha, MD;  Location: [facility] CATH LAB;  Service: Cardiovascular;  Laterality: N/A;    Current Medications: No outpatient medications  have been marked as taking for the 02/28/17 encounter (Appointment) with Imogene Burn, PA-C.     Allergies:   Dilaudid [hydromorphone hcl]; Minocycline hcl; Prednisone; Varenicline tartrate; and Zocor [simvastatin - high dose]   Social History   Socioeconomic History  . Marital status: Single    Spouse name: Not on file  . Number of children: Not on file  . Years of education: Not on file  . Highest education level: Not on file  Social Needs  . Financial resource strain: Not on file  . Food insecurity - worry: Not on file  . Food insecurity - inability: Not on file  . Transportation needs - medical: Not on file  . Transportation needs - non-medical: Not on file  Occupational History  . Occupation: Disabled  . Occupation: Truck Geophysicist/field seismologist  Tobacco Use  . Smoking status: Current Some Day Smoker    Packs/day: 0.50    Years: 50.00    Pack years: 25.00    Types: Cigarettes    Start date: 05/10/1968  . Smokeless tobacco: Never Used  Substance and Sexual Activity  . Alcohol use: No    Alcohol/week: 0.0 oz  . Drug use: No  . Sexual activity: Not on file  Other Topics Concern  . Not on file  Social History Narrative   Originally from [place]. Previously has lived in [place], [place], [place] & moved back to [place] in 1994. 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 Currently has  2 cats & 3 dogs. 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 Ronie Spies was in her current home. She also had an owl living in her house as a rehab pet. She also had chickens until Fall 2015. No mold exposure.      Family History:  The patient's ***family history includes Alzheimer's disease in her mother; Emphysema in her mother and sister; Factor V Leiden deficiency in her daughter, father, and sister; Heart disease in her father; Kidney cancer in her paternal grandmother.   ROS:   Please see the history of present illness.    ROS All other systems reviewed and are negative.   PHYSICAL EXAM:   VS:  There were no vitals taken  for this visit.  Physical Exam  GEN: Well nourished, well developed, in no acute distress  HEENT: normal  Neck: no JVD, carotid bruits, or masses Cardiac:RRR; no murmurs, rubs, or gallops  Respiratory:  clear to auscultation bilaterally, normal work of breathing GI: soft, nontender, nondistended, + BS Ext: without cyanosis, clubbing, or edema, Good distal pulses bilaterally MS: no deformity or atrophy  Skin: warm and dry, no rash Neuro:  Alert and Oriented x 3, Strength and sensation are intact Psych: euthymic mood, full affect  Wt Readings from Last 3 Encounters:  02/27/17 174 lb 2.6 oz (79 kg)  01/24/17 167 lb 12.8 oz (76.1 kg)  09/21/16 170 lb 12.8 oz (77.5 kg)      Studies/Labs Reviewed:   EKG:  EKG is*** ordered today.  The ekg ordered today demonstrates ***  Recent Labs: 09/21/2016: TSH 0.88 02/19/2017: B Natriuretic Peptide 121.0 02/23/2017: Magnesium 2.0 02/27/2017: ALT 178; BUN 30; Creatinine, Ser 0.81; Hemoglobin 12.0; Platelets 214; Potassium 3.7; Sodium 143   Lipid Panel    Component Value Date/Time   CHOL 194 09/21/2016 1031   TRIG 90.0 09/21/2016 1031   HDL 52.20 09/21/2016 1031   CHOLHDL 4 09/21/2016 1031   VLDL 18.0 09/21/2016 1031   LDLCALC 124 (H) 09/21/2016 1031   LDLDIRECT 172.6 04/01/2008 1205    Additional studies/ records that were reviewed today include:  ***    ASSESSMENT:    No diagnosis found.   PLAN:  In order of problems listed above:      Medication Adjustments/Labs and Tests Ordered: Current medicines are reviewed at length with the patient today.  Concerns regarding medicines are outlined above.  Medication changes, Labs and Tests ordered today are listed in the Patient Instructions below. There are no Patient Instructions on file for this visit.   Signed, Ermalinda Barrios, PA-C  02/27/2017 2:17 PM    [facility] [address], [place], [place]  [address] Phone: [phone]; Fax: [phone]

## 2017-02-27 NOTE — Progress Notes (Signed)
Nutrition Follow-up  DOCUMENTATION CODES:  Not applicable  INTERVENTION:  Unable to offer any interventions given removal of OGT and NPO status.   Will continue to monitor/ follow for diet progression and f/u as warranted.   NUTRITION DIAGNOSIS:  Inadequate oral intake related to lethargy/confusion, inability to eat as evidenced by NPO status.  Ongoing  GOAL:  Patient will meet greater than or equal to 90% of their needs   Was met until extubated-continue goal  MONITOR:  PO intake, Supplement acceptance, Diet advancement, Labs, Weight trends  REASON FOR ASSESSMENT:  Consult Assessment of nutrition requirement/status  ASSESSMENT:  67 y/o female PMHx tobacco abuse (50 pack year), HTN/HLD, Noncompliance, FactorV Leiden def, COPD (o2 dependent), MI, CAD. Presented with SOB, coughing, fever for a few days.  Found to be influenza A positive. Intubated 2/11 due to worsening respiratory distress.   Interval Hx: Patient maintained at goal TF rate for approximately 5 days. Had substantial weaning difficulty due to quickly elevating PCO2. Successfully Extubated 2/18.  RD consulted to reasess patient.    Patient has been having SOB, tachycardia since extubation. High risk for reintubation. Was on Bipap until mid morning, now on 4L. Currently NPO waiting for ST screen post extubation.   Patient unarouseable when seen today. She had required ativan 4x today.  At this time, RD unable to offer any interventions. No longer have access for tube feeding and pt is NPO due to lethargy. Had been meeting 100% of pro/kcal needs for last 5 days. No current need to place NGT/resume TF.   Will continue to monitor/ follow for diet progression and f/u as warranted.   Readjusted estimated needs.   She is near her admit weight-pt admitted at 173 lbs 1 oz (78.9 kg). Recent UBW appears to have been 165-170 lbs. She appears to  have lost 10-15 lbs in about 1 year, 20-25 lbs x 3 years   Physical Exam:  Generalized swelling is down since last week. Noted mild muscle wasting of clavicular musculature, temporalis. Trace edema to BLE, skin discolored/dry.  Labs: Albumin: 2.7, WBC:12.7, Glu:100-130 Meds: Methylprednisolone, insulin,  IV abx, H2RA, frequently receiving PRN ativan.   Recent Labs  Lab 02/22/17 1658 02/23/17 0516 02/23/17 1644  02/25/17 0450 02/26/17 0523 02/27/17 0416  NA  --  141  --    < > 147* 147* 143  K  --  4.0  --    < > 4.1 4.3 3.7  CL  --  102  --    < > 106 106 96*  CO2  --  29  --    < > 32 32 36*  BUN  --  42*  --    < > 39* 35* 30*  CREATININE  --  1.11*  --    < > 0.80 0.76 0.81  CALCIUM  --  8.2*  --    < > 8.2* 8.4* 8.3*  MG 1.9 1.9 2.0  --   --   --   --   PHOS 3.0 3.5 2.8  --   --   --   --   GLUCOSE  --  131*  --    < > 145* 145* 116*   < > = values in this interval not displayed.   NUTRITION - FOCUSED PHYSICAL EXAM: Mild muscle wasting of temporalis, clavicular musculature  Diet Order:  Diet NPO time specified  EDUCATION NEEDS:  No education needs have been identified at this time   Skin:  Skin Assessment: Reviewed RN Assessment  Last BM:  Unknown  Height:  Ht Readings from Last 1 Encounters:  02/26/17 '5\' 5"'$  (1.651 m)   Weight:  Wt Readings from Last 1 Encounters:  02/27/17 174 lb 2.6 oz (79 kg)   Wt Readings from Last 10 Encounters:  02/27/17 174 lb 2.6 oz (79 kg)  01/24/17 167 lb 12.8 oz (76.1 kg)  09/21/16 170 lb 12.8 oz (77.5 kg)  09/06/16 174 lb (78.9 kg)  03/14/16 181 lb 8 oz (82.3 kg)  12/06/15 187 lb (84.8 kg)  06/01/15 195 lb (88.5 kg)  05/14/15 195 lb (88.5 kg)  11/26/14 161 lb (73 kg)  10/28/14 195 lb (88.5 kg)   Ideal Body Weight:  56.82 kg  BMI:  Body mass index using admit weight is 28.9 kg/m.  Estimated Nutritional Needs:  Kcal:  1750-1900 kcals (22-24 kcal/kg bw) Protein:  80-90g Pro (1.4-1.6 g/kg ibw) Fluid:  1.7-1.9 L fluid ( 60m/kcal)  NBurtis JunesRD, LDN, CNSC Clinical Nutrition Pager:  [phone]/19/2019 2:24 PM

## 2017-02-27 NOTE — Care Management Note (Signed)
Case Management Note  Patient Details  Name: [name] MRN: [id number] Date of Birth: 1950-02-16   If discussed at Long Length of Stay Meetings, dates discussed:  02/27/2017  Additional Comments:  Sherald Barge, RN 02/27/2017, 12:29 PM

## 2017-02-28 ENCOUNTER — Ambulatory Visit: Payer: Medicare Other | Admitting: Physician Assistant

## 2017-02-28 LAB — CBC WITH DIFFERENTIAL/PLATELET
BASOS ABS: 0 10*3/uL (ref 0.0–0.1)
BASOS PCT: 0 %
EOS ABS: 0 10*3/uL (ref 0.0–0.7)
EOS PCT: 0 %
HCT: 37.6 % (ref 36.0–46.0)
Hemoglobin: 11.9 g/dL — ABNORMAL LOW (ref 12.0–15.0)
Lymphocytes Relative: 10 %
Lymphs Abs: 0.8 10*3/uL (ref 0.7–4.0)
MCH: 28.7 pg (ref 26.0–34.0)
MCHC: 31.6 g/dL (ref 30.0–36.0)
MCV: 90.8 fL (ref 78.0–100.0)
MONO ABS: 0.8 10*3/uL (ref 0.1–1.0)
Monocytes Relative: 10 %
Neutro Abs: 6.4 10*3/uL (ref 1.7–7.7)
Neutrophils Relative %: 80 %
PLATELETS: 191 10*3/uL (ref 150–400)
RBC: 4.14 MIL/uL (ref 3.87–5.11)
RDW: 15.6 % — AB (ref 11.5–15.5)
WBC: 8 10*3/uL (ref 4.0–10.5)

## 2017-02-28 LAB — COMPREHENSIVE METABOLIC PANEL
ALBUMIN: 2.5 g/dL — AB (ref 3.5–5.0)
ALT: 123 U/L — AB (ref 14–54)
AST: 44 U/L — AB (ref 15–41)
Alkaline Phosphatase: 56 U/L (ref 38–126)
Anion gap: 8 (ref 5–15)
BUN: 28 mg/dL — AB (ref 6–20)
CO2: 34 mmol/L — AB (ref 22–32)
CREATININE: 0.74 mg/dL (ref 0.44–1.00)
Calcium: 8.3 mg/dL — ABNORMAL LOW (ref 8.9–10.3)
Chloride: 97 mmol/L — ABNORMAL LOW (ref 101–111)
GFR calc Af Amer: >60 mL/min (ref >60)
GFR calc non Af Amer: >60 mL/min (ref >60)
Glucose, Bld: 117 mg/dL — ABNORMAL HIGH (ref 65–99)
POTASSIUM: 4.3 mmol/L (ref 3.5–5.1)
SODIUM: 139 mmol/L (ref 135–145)
Total Bilirubin: 1 mg/dL (ref 0.3–1.2)
Total Protein: 5.5 g/dL — ABNORMAL LOW (ref 6.5–8.1)

## 2017-02-28 LAB — GLUCOSE, CAPILLARY
GLUCOSE-CAPILLARY: 104 mg/dL — AB (ref 65–99)
GLUCOSE-CAPILLARY: 98 mg/dL (ref 65–99)
GLUCOSE-CAPILLARY: 93 mg/dL (ref 65–99)
Glucose-Capillary: 123 mg/dL — ABNORMAL HIGH (ref 65–99)
Glucose-Capillary: 109 mg/dL — ABNORMAL HIGH (ref 65–99)
Glucose-Capillary: 122 mg/dL — ABNORMAL HIGH (ref 65–99)

## 2017-02-28 NOTE — Progress Notes (Signed)
PROGRESS NOTE    [name]  TKW:[id number] DOB: 12-22-1950 DOA: 02/19/2017 PCP: [name], NP    Brief Narrative:  [name] y.o.female,w hx of CAD, Hypothyroidism, PE, Factor V leiden, Copd on home o2, apparently c/o increase in dyspnea for 24 hours prior to admission. Slight cough, dry-yellow sputum. Denies fever, chills, cp, palp, n/v, diarrhea, brbpr, black stool. Pt presents to ED due to increase in dyspnea.   CXR demonstrated acute on chronic pulmonary interstitial opacity; CTA neg for PE. Positive influenza A  Assessment & Plan:   Principal Problem:   COPD exacerbation ([place]) Active Problems:   Tachycardia   Influenza A   Acute respiratory failure ([place])   1. Acute on chronic respiratory failure with hypoxia and hypercapnia.  Related to influenza infection as well as COPD exacerbation.  Briefly required intubation, currently intermittently requiring BiPAP.  CT chest was negative for pulmonary embolus.  Continue to monitor in the stepdown.  She is high risk for reintubation. 2. Influenza A.  Treated with Tamiflu 3. COPD exacerbation.  Currently on intravenous steroids and bronchodilators.  Pulmonology following.  Continues to be short of breath. 4. 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  Currently on full dose Lovenox.  Resume Coumadin once stable. 5. Hypothyroidism.  Continue on IV Synthroid for now until swallowing issues have been addressed. 6. Dysphagia.  Being followed by speech therapy for swallow evaluation.   DVT prophylaxis: Full dose Lovenox Code Status: Full code Family Communication: No family present Disposition Plan: May need placement.   Consultants:   Pulmonology  Procedures:     Antimicrobials:      Subjective: Wants to eat.  Keeps eyes closed during visit.  Still feels short of breath  Objective: Vitals:   02/28/17 1600 02/28/17 1648 02/28/17 1700 02/28/17 1800  BP: 139/70  139/61   Pulse: 83  100 98  Resp:  (!) 25  (!) 29 (!) 28  Temp: 97.8 F (36.6 C)  98.4 F (36.9 C) 98.4 F (36.9 C)  TempSrc:      SpO2: (!) 86% (!) 89% (!) 89% (!) 89%  Weight:      Height:        Intake/Output Summary (Last 24 hours) at 02/28/2017 1832 Last data filed at 02/28/2017 1809 Gross per 24 hour  Intake 390 ml  Output 2275 ml  Net -1885 ml   Filed Weights   02/26/17 0500 02/27/17 0500 02/28/17 0500  Weight: 85.5 kg (188 lb 7.9 oz) 79 kg (174 lb 2.6 oz) 78.7 kg (173 lb 8 oz)    Examination:  General exam: Appears calm, confused Respiratory system: Increased work of breathing, wheezing bilaterally. Cardiovascular system: S1 & S2 heard, RRR. No JVD, murmurs, rubs, gallops or clicks. No pedal edema. Gastrointestinal system: Abdomen is nondistended, soft and nontender. No organomegaly or masses felt. Normal bowel sounds heard. Central nervous system:. No focal neurological deficits. Extremities: Symmetric 5 x 5 power. Skin: No rashes, lesions or ulcers Psychiatry: Confused, lethargic    Data Reviewed: I have personally reviewed following labs and imaging studies  CBC: Recent Labs  Lab 02/24/17 0422 02/25/17 0450 02/26/17 0523 02/27/17 0416 02/28/17 0626  WBC 10.5 9.3 11.4* 12.7* 8.0  NEUTROABS  --   --   --  10.8* 6.4  HGB 11.8* 11.0* 11.1* 12.0 11.9*  HCT 38.9 35.8* 35.8* 38.6 37.6  MCV 93.3 93.2 92.7 92.1 90.8  PLT 195 172 181 214 924   Basic Metabolic Panel: Recent Labs  Lab  02/22/17 0543 02/22/17 1658 02/23/17 0516 02/23/17 1644 02/24/17 0422 02/25/17 0450 02/26/17 0523 02/27/17 0416 02/28/17 0626  NA 138  --  141  --  143 147* 147* 143 139  K 4.4  --  4.0  --  3.8 4.1 4.3 3.7 4.3  CL 104  --  102  --  105 106 106 96* 97*  CO2 26  --  29  --  29 32 32 36* 34*  GLUCOSE 117*  --  131*  --  149* 145* 145* 116* 117*  BUN 35*  --  42*  --  39* 39* 35* 30* 28*  CREATININE 0.99  --  1.11*  --  0.87 0.80 0.76 0.81 0.74  CALCIUM 8.6*  --  8.2*  --  8.0* 8.2* 8.4* 8.3* 8.3*  MG  2.1 1.9 1.9 2.0  --   --   --   --   --   PHOS 2.7 3.0 3.5 2.8  --   --   --   --   --    GFR: Estimated Creatinine Clearance: 71.7 mL/min (by C-G formula based on SCr of 0.74 mg/dL). Liver Function Tests: Recent Labs  Lab 02/27/17 0416 02/28/17 0626  AST 80* 44*  ALT 178* 123*  ALKPHOS 69 56  BILITOT 1.0 1.0  PROT 6.1* 5.5*  ALBUMIN 2.7* 2.5*   No results for input(s): LIPASE, AMYLASE in the last 168 hours. No results for input(s): AMMONIA in the last 168 hours. Coagulation Profile: No results for input(s): INR, PROTIME in the last 168 hours. Cardiac Enzymes: No results for input(s): CKTOTAL, CKMB, CKMBINDEX, TROPONINI in the last 168 hours. BNP (last 3 results) No results for input(s): PROBNP in the last 8760 hours. HbA1C: No results for input(s): HGBA1C in the last 72 hours. CBG: Recent Labs  Lab 02/28/17 0124 02/28/17 0442 02/28/17 0739 02/28/17 1104 02/28/17 1611  GLUCAP 123* 109* 122* 104* 98   Lipid Profile: No results for input(s): CHOL, HDL, LDLCALC, TRIG, CHOLHDL, LDLDIRECT in the last 72 hours. Thyroid Function Tests: No results for input(s): TSH, T4TOTAL, FREET4, T3FREE, THYROIDAB in the last 72 hours. Anemia Panel: No results for input(s): VITAMINB12, FOLATE, FERRITIN, TIBC, IRON, RETICCTPCT in the last 72 hours. Sepsis Labs: No results for input(s): PROCALCITON, LATICACIDVEN in the last 168 hours.  Recent Results (from the past 240 hour(s))  Blood Culture (routine x 2)     Status: None   Collection Time: 02/19/17  3:39 PM  Result Value Ref Range Status   Specimen Description LEFT ANTECUBITAL  Final   Special Requests   Final    BOTTLES DRAWN AEROBIC AND ANAEROBIC Blood Culture adequate volume   Culture   Final    NO GROWTH 5 DAYS Performed at [facility], [address], [place], [place] [address]    Report Status 02/24/2017 FINAL  Final  Blood Culture (routine x 2)     Status: None   Collection Time: 02/19/17  3:44 PM  Result Value Ref Range  Status   Specimen Description BLOOD RIGHT HAND  Final   Special Requests   Final    BOTTLES DRAWN AEROBIC AND ANAEROBIC Blood Culture adequate volume   Culture   Final    NO GROWTH 5 DAYS Performed at [facility], [address] [place], [place] [address]    Report Status 02/24/2017 FINAL  Final  MRSA PCR Screening     Status: None   Collection Time: 02/20/17 12:01 AM  Result Value Ref  Range Status   MRSA by PCR NEGATIVE NEGATIVE Final    Comment:        The GeneXpert MRSA Assay (FDA approved for NASAL specimens only), is one component of a comprehensive MRSA colonization surveillance program. It is not intended to diagnose MRSA infection nor to guide or monitor treatment for MRSA infections. Performed at [facility], [address] [place], [place] [address]          Radiology Studies: Dg Chest [facility] 1 View  Result Date: 02/27/2017 CLINICAL DATA:  Respiratory failure, COPD EXAM: PORTABLE CHEST 1 VIEW COMPARISON:  02/26/2017 FINDINGS: There is hyperinflation of the lungs compatible with COPD. Cardiomegaly. Interstitial prominence has improved slightly. Mild residual interstitial edema suspected. Small bilateral effusions. Vascular congestion. IMPRESSION: COPD. Continued interstitial prominence likely interstitial edema, improved since prior study. Small effusions. Electronically Signed   By: Rolm Baptise M.D.   On: 02/27/2017 07:57        Scheduled Meds: . chlorhexidine  15 mL Mouth Rinse BID  . enoxaparin (LOVENOX) injection  80 mg Subcutaneous Q12H  . famotidine  20 mg Oral Daily  . insulin aspart  2 Units Subcutaneous Q4H  . ipratropium-albuterol  3 mL Nebulization Q6H  . levothyroxine  62.5 mcg Intravenous Daily  . mouth rinse  15 mL Mouth Rinse q12n4p  . methylPREDNISolone (SOLU-MEDROL) injection  60 mg Intravenous Q8H   Continuous Infusions: . sodium chloride    . feeding supplement (VITAL AF 1.2 CAL) 1,000 mL (02/25/17 1849)     LOS: 9 days    Time  spent: 46mins    Kathie Dike, MD [facility] Pager [phone]  If 7PM-7AM, please contact night-coverage [email or web address] Password [facility] 02/28/2017, 6:32 PM

## 2017-02-28 NOTE — Progress Notes (Signed)
  Speech Language Pathology Treatment: Dysphagia  Patient Details Name: [name] MRN: [id number] DOB: [birth year] Today's Date: 02/28/2017 Time: 8309-4076 SLP Time Calculation (min) (ACUTE ONLY): 22 min  Assessment / Plan / Recommendation Clinical Impression  Pt removed from Bi-PAP earlier this AM and tolerating nasal cannula. Pt with improved alertness this AM and asks for her "housemate, Dwayne", although would not open eyes despite cues. Pt voiced feeling hungry and also requested juice. Pt with slow, sluggish oral phase and requires cues to "swallow" followed by delayed cough at times. Pt continues to demonstrate reduced level of alertness for safe po intake at this time, although she could probably take important po medications crushed in a small amount of puree and ice chips after oral care for comfort. SLP will check back later this afternoon to see if alertness improves.    HPI HPI: [name]  is a 67 y.o. female, w hx of CAD, Hypothyroidism, PE, Factor V leiden, Copd on home o2, apparently c/o increase in dyspnea for 24 hours prior to admission.  Slight cough, dry-yellow sputum.  Denies fever, chills, cp, palp, n/v, diarrhea, brbpr, black stool.  Pt presents to ED due to increase in dyspnea.      SLP Plan  Continue with current plan of care       Recommendations  Diet recommendations: NPO Medication Administration: Crushed with puree                Oral Care Recommendations: Oral care QID Follow up Recommendations: Skilled Nursing facility SLP Visit Diagnosis: Dysphagia, oropharyngeal phase (R13.12) Plan: Continue with current plan of care       Thank you,  Genene Churn, [place]                 [place] 02/28/2017, 11:03 AM

## 2017-02-28 NOTE — Progress Notes (Signed)
Subjective: She is on BiPAP overnight.  She was transitioned to nasal cannula yesterday.  Speech evaluation was requested but was not able to be completed because she was not alert enough having received Ativan.  She still seems to have some element of encephalopathy.  Objective: Vital signs in last 24 hours: Temp:  [97.5 F (36.4 C)-99 F (37.2 C)] 97.5 F (36.4 C) (02/20 0600) Pulse Rate:  [57-107] 64 (02/20 0600) Resp:  [21-33] 22 (02/20 0600) BP: (106-165)/(50-84) 122/54 (02/20 0600) SpO2:  [92 %-100 %] 99 % (02/20 0600) FiO2 (%):  [35 %] 35 % (02/20 0118) Weight:  [78.7 kg (173 lb 8 oz)] 78.7 kg (173 lb 8 oz) (02/20 0500) Weight change: -0.3 kg (-10.6 oz) Last BM Date: (UTA)  Intake/Output from previous day: 02/19 0701 - 02/20 0700 In: 200 [IV Piggyback:200] Out: 2550 [Urine:2550]  PHYSICAL EXAM General appearance: On BiPAP arousable Resp: rhonchi bilaterally Cardio: regular rate and rhythm, S1, S2 normal, no murmur, click, rub or gallop GI: soft, non-tender; bowel sounds normal; no masses,  no organomegaly Extremities: extremities normal, atraumatic, no cyanosis or edema Skin warm and dry  Lab Results:  Results for orders placed or performed during the hospital encounter of 02/19/17 (from the past 48 hour(s))  Glucose, capillary     Status: Abnormal   Collection Time: 02/26/17  8:02 AM  Result Value Ref Range   Glucose-Capillary 130 (H) 65 - 99 mg/dL  Blood gas, arterial     Status: Abnormal   Collection Time: 02/26/17 10:26 AM  Result Value Ref Range   FIO2 35.00    Delivery systems VENTILATOR    Mode PRESSURE SUPPORT    Peep/cpap 5.0 cm H20   Pressure support 10.0 cm H20   pH, Arterial 7.360 7.350 - 7.450   pCO2 arterial 63.2 (H) 32.0 - 48.0 mmHg   pO2, Arterial 83.9 83.0 - 108.0 mmHg   Bicarbonate 31.5 (H) 20.0 - 28.0 mmol/L   Acid-Base Excess 9.1 (H) 0.0 - 2.0 mmol/L   O2 Saturation 95.0 %   Patient temperature 37.3    Collection site RIGHT RADIAL    Drawn by [id number]    Sample type ARTERIAL DRAW    Allens test (pass/fail) PASS PASS    Comment: Performed at [facility], [address], [place], [place] [address]  Glucose, capillary     Status: Abnormal   Collection Time: 02/26/17 11:12 AM  Result Value Ref Range   Glucose-Capillary 129 (H) 65 - 99 mg/dL  Glucose, capillary     Status: Abnormal   Collection Time: 02/26/17  5:17 PM  Result Value Ref Range   Glucose-Capillary 123 (H) 65 - 99 mg/dL  Blood gas, arterial     Status: Abnormal   Collection Time: 02/26/17  6:00 PM  Result Value Ref Range   O2 Content 4.0 L/min   Delivery systems NASAL CANNULA    pH, Arterial 7.39 7.350 - 7.450   pCO2 arterial 65.7 (HH) 32.0 - 48.0 mmHg    Comment: CRITICAL RESULT CALLED TO, READ BACK BY AND VERIFIED WITH: LEANDER,RN BY J.ROWLAND,RRT AT 1813 ON 02/26/17    pO2, Arterial 71.4 (L) 83.0 - 108.0 mmHg   Bicarbonate 35.7 (H) 20.0 - 28.0 mmol/L   Acid-Base Excess 13.8 (H) 0.0 - 2.0 mmol/L   O2 Saturation 93.6 %   Patient temperature 37.3    Collection site RIGHT RADIAL    Drawn by [id number]    Sample type ARTERIAL DRAW    Allens  test (pass/fail) PASS PASS    Comment: Performed at [facility], [address], [place], [place] [address]  Glucose, capillary     Status: Abnormal   Collection Time: 02/26/17  8:10 PM  Result Value Ref Range   Glucose-Capillary 107 (H) 65 - 99 mg/dL  Glucose, capillary     Status: Abnormal   Collection Time: 02/26/17 11:11 PM  Result Value Ref Range   Glucose-Capillary 113 (H) 65 - 99 mg/dL  Glucose, capillary     Status: Abnormal   Collection Time: 02/27/17  3:47 AM  Result Value Ref Range   Glucose-Capillary 100 (H) 65 - 99 mg/dL  CBC with Differential/Platelet     Status: Abnormal   Collection Time: 02/27/17  4:16 AM  Result Value Ref Range   WBC 12.7 (H) 4.0 - 10.5 K/uL    Comment: INCREASED BANDS (>20% BANDS) WHITE COUNT CONFIRMED ON SMEAR    RBC 4.19 3.87 - 5.11 MIL/uL   Hemoglobin 12.0 12.0 - 15.0  g/dL   HCT 38.6 36.0 - 46.0 %   MCV 92.1 78.0 - 100.0 fL   MCH 28.6 26.0 - 34.0 pg   MCHC 31.1 30.0 - 36.0 g/dL   RDW 16.3 (H) 11.5 - 15.5 %   Platelets 214 150 - 400 K/uL   Neutrophils Relative % 84 %   Neutro Abs 10.8 (H) 1.7 - 7.7 K/uL   Lymphocytes Relative 8 %   Lymphs Abs 1.0 0.7 - 4.0 K/uL   Monocytes Relative 8 %   Monocytes Absolute 1.0 0.1 - 1.0 K/uL   Eosinophils Relative 0 %   Eosinophils Absolute 0.0 0.0 - 0.7 K/uL   Basophils Relative 0 %   Basophils Absolute 0.0 0.0 - 0.1 K/uL    Comment: Performed at [facility], [address] [place], [place] [address]  Comprehensive metabolic panel     Status: Abnormal   Collection Time: 02/27/17  4:16 AM  Result Value Ref Range   Sodium 143 135 - 145 mmol/L   Potassium 3.7 3.5 - 5.1 mmol/L   Chloride 96 (L) 101 - 111 mmol/L   CO2 36 (H) 22 - 32 mmol/L   Glucose, Bld 116 (H) 65 - 99 mg/dL   BUN 30 (H) 6 - 20 mg/dL   Creatinine, Ser 0.81 0.44 - 1.00 mg/dL   Calcium 8.3 (L) 8.9 - 10.3 mg/dL   Total Protein 6.1 (L) 6.5 - 8.1 g/dL   Albumin 2.7 (L) 3.5 - 5.0 g/dL   AST 80 (H) 15 - 41 U/L   ALT 178 (H) 14 - 54 U/L   Alkaline Phosphatase 69 38 - 126 U/L   Total Bilirubin 1.0 0.3 - 1.2 mg/dL   GFR calc non Af Amer >60 >60 mL/min   GFR calc Af Amer >60 >60 mL/min    Comment: (NOTE) The eGFR has been calculated using the CKD EPI equation. This calculation has not been validated in all clinical situations. eGFR's persistently <60 mL/min signify possible Chronic Kidney Disease.    Anion gap 11 5 - 15    Comment: Performed at [facility], [address] [place], [place] [address]  Glucose, capillary     Status: Abnormal   Collection Time: 02/27/17  7:50 AM  Result Value Ref Range   Glucose-Capillary 105 (H) 65 - 99 mg/dL  Glucose, capillary     Status: Abnormal   Collection Time: 02/27/17 11:58 AM  Result Value Ref Range   Glucose-Capillary 110 (H) 65 -  99 mg/dL  Glucose, capillary     Status: Abnormal   Collection Time:  02/27/17  4:46 PM  Result Value Ref Range   Glucose-Capillary 110 (H) 65 - 99 mg/dL  Glucose, capillary     Status: Abnormal   Collection Time: 02/27/17  7:45 PM  Result Value Ref Range   Glucose-Capillary 105 (H) 65 - 99 mg/dL  Glucose, capillary     Status: Abnormal   Collection Time: 02/28/17  1:24 AM  Result Value Ref Range   Glucose-Capillary 123 (H) 65 - 99 mg/dL  Glucose, capillary     Status: Abnormal   Collection Time: 02/28/17  4:42 AM  Result Value Ref Range   Glucose-Capillary 109 (H) 65 - 99 mg/dL  CBC with Differential/Platelet     Status: Abnormal   Collection Time: 02/28/17  6:26 AM  Result Value Ref Range   WBC 8.0 4.0 - 10.5 K/uL   RBC 4.14 3.87 - 5.11 MIL/uL   Hemoglobin 11.9 (L) 12.0 - 15.0 g/dL   HCT 37.6 36.0 - 46.0 %   MCV 90.8 78.0 - 100.0 fL   MCH 28.7 26.0 - 34.0 pg   MCHC 31.6 30.0 - 36.0 g/dL   RDW 15.6 (H) 11.5 - 15.5 %   Platelets 191 150 - 400 K/uL   Neutrophils Relative % 80 %   Neutro Abs 6.4 1.7 - 7.7 K/uL   Lymphocytes Relative 10 %   Lymphs Abs 0.8 0.7 - 4.0 K/uL   Monocytes Relative 10 %   Monocytes Absolute 0.8 0.1 - 1.0 K/uL   Eosinophils Relative 0 %   Eosinophils Absolute 0.0 0.0 - 0.7 K/uL   Basophils Relative 0 %   Basophils Absolute 0.0 0.0 - 0.1 K/uL    Comment: Performed at [facility], [address], [place], [place] [address]  Comprehensive metabolic panel     Status: Abnormal   Collection Time: 02/28/17  6:26 AM  Result Value Ref Range   Sodium 139 135 - 145 mmol/L   Potassium 4.3 3.5 - 5.1 mmol/L   Chloride 97 (L) 101 - 111 mmol/L   CO2 34 (H) 22 - 32 mmol/L   Glucose, Bld 117 (H) 65 - 99 mg/dL   BUN 28 (H) 6 - 20 mg/dL   Creatinine, Ser 0.74 0.44 - 1.00 mg/dL   Calcium 8.3 (L) 8.9 - 10.3 mg/dL   Total Protein 5.5 (L) 6.5 - 8.1 g/dL   Albumin 2.5 (L) 3.5 - 5.0 g/dL   AST 44 (H) 15 - 41 U/L   ALT 123 (H) 14 - 54 U/L   Alkaline Phosphatase 56 38 - 126 U/L   Total Bilirubin 1.0 0.3 - 1.2 mg/dL   GFR calc non Af  Amer >60 >60 mL/min   GFR calc Af Amer >60 >60 mL/min    Comment: (NOTE) The eGFR has been calculated using the CKD EPI equation. This calculation has not been validated in all clinical situations. eGFR's persistently <60 mL/min signify possible Chronic Kidney Disease.    Anion gap 8 5 - 15    Comment: Performed at [facility], [address] [place], [place] [address]    ABGS Recent Labs    02/26/17 1800  PHART 7.39  PO2ART 71.4*  HCO3 35.7*   CULTURES Recent Results (from the past 240 hour(s))  Blood Culture (routine x 2)     Status: None   Collection Time: 02/19/17  3:39 PM  Result Value Ref Range Status  Specimen Description LEFT ANTECUBITAL  Final   Special Requests   Final    BOTTLES DRAWN AEROBIC AND ANAEROBIC Blood Culture adequate volume   Culture   Final    NO GROWTH 5 DAYS Performed at [facility], [address], [place], [place] [address]    Report Status 02/24/2017 FINAL  Final  Blood Culture (routine x 2)     Status: None   Collection Time: 02/19/17  3:44 PM  Result Value Ref Range Status   Specimen Description BLOOD RIGHT HAND  Final   Special Requests   Final    BOTTLES DRAWN AEROBIC AND ANAEROBIC Blood Culture adequate volume   Culture   Final    NO GROWTH 5 DAYS Performed at [facility], [address] [place], [place] [address]    Report Status 02/24/2017 FINAL  Final  MRSA PCR Screening     Status: None   Collection Time: 02/20/17 12:01 AM  Result Value Ref Range Status   MRSA by PCR NEGATIVE NEGATIVE Final    Comment:        The GeneXpert MRSA Assay (FDA approved for NASAL specimens only), is one component of a comprehensive MRSA colonization surveillance program. It is not intended to diagnose MRSA infection nor to guide or monitor treatment for MRSA infections. Performed at [facility], [address] [place], [place] [address]    Studies/Results: Dg Chest Port 1 View  Result Date: 02/27/2017 CLINICAL DATA:  Respiratory  failure, COPD EXAM: PORTABLE CHEST 1 VIEW COMPARISON:  02/26/2017 FINDINGS: There is hyperinflation of the lungs compatible with COPD. Cardiomegaly. Interstitial prominence has improved slightly. Mild residual interstitial edema suspected. Small bilateral effusions. Vascular congestion. IMPRESSION: COPD. Continued interstitial prominence likely interstitial edema, improved since prior study. Small effusions. Electronically Signed   By: Rolm Baptise M.D.   On: 02/27/2017 07:57   Dg Chest Port 1 View  Result Date: 02/26/2017 CLINICAL DATA:  Status postextubation.  Shortness of Breath EXAM: PORTABLE CHEST 1 VIEW COMPARISON:  Study obtained earlier in the day FINDINGS: Endotracheal tube and nasogastric tube have been removed. No pneumothorax. There is a small right pleural effusion. There is a minimal left pleural effusion with mild left base atelectasis. There is generalized interstitial edema. No consolidation. Heart is mildly enlarged with pulmonary venous hypertension. No adenopathy. No bone lesions. IMPRESSION: No pneumothorax. Findings felt to represent a degree of congestive heart failure. Mild left base atelectasis. Electronically Signed   By: Lowella Grip III M.D.   On: 02/26/2017 12:36   Dg Chest Port 1 View  Result Date: 02/26/2017 CLINICAL DATA:  Hypoxia EXAM: PORTABLE CHEST 1 VIEW COMPARISON:  March 06, 2017 FINDINGS: Endotracheal tube tip is 6.0 cm above the carina. Nasogastric tube tip and side port are below the diaphragm. No evident pneumothorax. There is a small right pleural effusion. There is mild bibasilar atelectasis. There is mild cardiomegaly with pulmonary venous hypertension. There is aortic atherosclerosis. No adenopathy. No bone lesions. IMPRESSION: Tube positions as described without pneumothorax. There is pulmonary vascular congestion with small right pleural effusion. Bibasilar atelectasis. No frank edema or consolidation. There is aortic atherosclerosis. Aortic  Atherosclerosis (ICD10-I70.0). Electronically Signed   By: Lowella Grip III M.D.   On: 02/26/2017 08:20    Medications:  Prior to Admission:  Medications Prior to Admission  Medication Sig Dispense Refill Last Dose  . levothyroxine (SYNTHROID, LEVOTHROID) 125 MCG tablet take 1 tablet by mouth once daily 90 tablet 2 unknown  . warfarin (COUMADIN) 1  MG tablet Take 1 tablet ('1mg'$ ) on Tuesdays and Saturdays along with a '5mg'$  tablet (Patient taking differently: Take 1 mg by mouth 2 (two) times a week. Take 1 tablet ('1mg'$ ) on Tuesdays and Fridays along with a '5mg'$  tablet) 30 tablet 4 unknown  . warfarin (COUMADIN) 5 MG tablet Take 1 tablet (5 mg total) by mouth daily. 90 tablet 3 unknown  . albuterol (PROVENTIL) (2.5 MG/3ML) 0.083% nebulizer solution USE 1 VIAL BY NEBULIZER EVERY 4 HOURS AS NEEDED FOR WHEEZING. DX: J44.9 375 vial 0   . budesonide (PULMICORT) 0.5 MG/2ML nebulizer solution Take 2 mLs (0.5 mg total) by nebulization 2 (two) times daily. Dx: J43.9 120 mL 3   . fentaNYL (DURAGESIC - DOSED MCG/HR) 50 MCG/HR Place 50 mcg onto the skin every 3 (three) days.   Taking  . fish oil-omega-3 fatty acids 1000 MG capsule Take 2 g by mouth daily.     Taking  . ipratropium-albuterol (DUONEB) 0.5-2.5 (3) MG/3ML SOLN 1 vial via nebulizer at breakfast, lunch, dinner and bedtime daily. May take 2 additional treatments on bad day Dx: J43.9 360 mL 3   . Magnesium 500 MG CAPS Take 500 mg by mouth.   Taking  . nitroGLYCERIN (NITROSTAT) 0.4 MG SL tablet Place 1 tablet (0.4 mg total) under the tongue every 5 (five) minutes as needed for chest pain. 25 tablet 3 Taking  . Respiratory Therapy Supplies (FLUTTER) DEVI Use as directed 1 each 0 Taking  . ticagrelor (BRILINTA) 60 MG TABS tablet Take 1 tablet (60 mg total) by mouth 2 (two) times daily. (Patient not taking: Reported on 01/24/2017) 60 tablet 11 Not Taking   Scheduled: . chlorhexidine  15 mL Mouth Rinse BID  . enoxaparin (LOVENOX) injection  80 mg  Subcutaneous Q12H  . famotidine  20 mg Oral Daily  . insulin aspart  2 Units Subcutaneous Q4H  . ipratropium-albuterol  3 mL Nebulization Q6H  . levothyroxine  62.5 mcg Intravenous Daily  . mouth rinse  15 mL Mouth Rinse q12n4p  . methylPREDNISolone (SOLU-MEDROL) injection  60 mg Intravenous Q8H   Continuous: . sodium chloride    . feeding supplement (VITAL AF 1.2 CAL) 1,000 mL (02/25/17 1849)  . piperacillin-tazobactam (ZOSYN)  IV 3.375 g (02/28/17 0503)   TXM:IWOEHO chloride, albuterol, LORazepam, metoprolol tartrate  Assesment: She was admitted to the hospital with influenza COPD exacerbation and acute hypoxic and hypercapnic respiratory failure.  She initially was treated with BiPAP but her PCO2 did not come down and eventually peaked at over 100 prompting intubation and mechanical ventilation.  She was extubated on the 18th.  She has been requiring BiPAP at night. Principal Problem:   COPD exacerbation ([place]) Active Problems:   Tachycardia   Influenza A   Acute respiratory failure ([place])    Plan: Continue current treatments.  BiPAP as needed.  Speech evaluation if possible today    LOS: 9 days   Cathie Bonnell L 02/28/2017, 7:23 AM

## 2017-03-01 LAB — COMPREHENSIVE METABOLIC PANEL
ALK PHOS: 60 U/L (ref 38–126)
ALT: 105 U/L — AB (ref 14–54)
AST: 35 U/L (ref 15–41)
Albumin: 2.8 g/dL — ABNORMAL LOW (ref 3.5–5.0)
Anion gap: 10 (ref 5–15)
BUN: 30 mg/dL — AB (ref 6–20)
CALCIUM: 8.5 mg/dL — AB (ref 8.9–10.3)
CO2: 32 mmol/L (ref 22–32)
CREATININE: 0.72 mg/dL (ref 0.44–1.00)
Chloride: 99 mmol/L — ABNORMAL LOW (ref 101–111)
Glucose, Bld: 95 mg/dL (ref 65–99)
Potassium: 3.9 mmol/L (ref 3.5–5.1)
Sodium: 141 mmol/L (ref 135–145)
Total Bilirubin: 0.9 mg/dL (ref 0.3–1.2)
Total Protein: 6.2 g/dL — ABNORMAL LOW (ref 6.5–8.1)

## 2017-03-01 LAB — CBC WITH DIFFERENTIAL/PLATELET
Basophils Absolute: 0 10*3/uL (ref 0.0–0.1)
Basophils Relative: 0 %
Eosinophils Absolute: 0 10*3/uL (ref 0.0–0.7)
Eosinophils Relative: 0 %
HCT: 40.3 % (ref 36.0–46.0)
HEMOGLOBIN: 12.6 g/dL (ref 12.0–15.0)
LYMPHS ABS: 1 10*3/uL (ref 0.7–4.0)
LYMPHS PCT: 8 %
MCH: 28.3 pg (ref 26.0–34.0)
MCHC: 31.3 g/dL (ref 30.0–36.0)
MCV: 90.4 fL (ref 78.0–100.0)
Monocytes Absolute: 1.5 10*3/uL — ABNORMAL HIGH (ref 0.1–1.0)
Monocytes Relative: 11 %
NEUTROS ABS: 10.8 10*3/uL — AB (ref 1.7–7.7)
NEUTROS PCT: 81 %
Platelets: 247 10*3/uL (ref 150–400)
RBC: 4.46 MIL/uL (ref 3.87–5.11)
RDW: 15.6 % — ABNORMAL HIGH (ref 11.5–15.5)
WBC: 13.3 10*3/uL — AB (ref 4.0–10.5)

## 2017-03-01 LAB — GLUCOSE, CAPILLARY
GLUCOSE-CAPILLARY: 104 mg/dL — AB (ref 65–99)
GLUCOSE-CAPILLARY: 110 mg/dL — AB (ref 65–99)
GLUCOSE-CAPILLARY: 92 mg/dL (ref 65–99)
Glucose-Capillary: 98 mg/dL (ref 65–99)
Glucose-Capillary: 95 mg/dL (ref 65–99)
Glucose-Capillary: 152 mg/dL — ABNORMAL HIGH (ref 65–99)

## 2017-03-01 NOTE — Progress Notes (Signed)
PROGRESS NOTE    [name]  YHC:[id number] DOB: 12-23-1950 DOA: 02/19/2017 PCP: [name], NP    Brief Narrative:  [name] y.o.female,w hx of CAD, Hypothyroidism, PE, Factor V leiden, Copd on home o2, apparently c/o increase in dyspnea for 24 hours prior to admission. Slight cough, dry-yellow sputum. Denies fever, chills, cp, palp, n/v, diarrhea, brbpr, black stool. Pt presents to ED due to increase in dyspnea.   CXR demonstrated acute on chronic pulmonary interstitial opacity; CTA neg for PE. Positive influenza A  Assessment & Plan:   Principal Problem:   COPD exacerbation ([place]) Active Problems:   Tachycardia   Influenza A   Acute respiratory failure ([place])   1. Acute on chronic respiratory failure with hypoxia and hypercapnia.  Related to influenza infection as well as COPD exacerbation.  Briefly required intubation, currently intermittently requiring BiPAP.  CT chest was negative for pulmonary embolus.  She is currently still on BiPAP.  Mental status has been waxing and waning, making p.o. intake and coming off BiPAP challenging. Continue to monitor in the stepdown.   2. Influenza A.  Treated with Tamiflu 3. COPD exacerbation.  Currently on intravenous steroids and bronchodilators.  Pulmonology following. Continues to be short of breath and high risk for reintubation. 4. 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  Currently on full dose Lovenox.  Resume Coumadin once stable. 5. Hypothyroidism.  Currently on IV Synthroid for now until swallowing issues have been addressed. 6. Dysphagia.  Being followed by speech therapy for swallow evaluation.  Current recommendations are continued n.p.o. due to level of alertness.   DVT prophylaxis: Full dose Lovenox Code Status: Full code Family Communication: No family present Disposition Plan: May need placement.   Consultants:   Pulmonology  Procedures:     Antimicrobials:      Subjective: Patient  is somnolent today.  Does not answer questions.  She does moan when she is moved.  Objective: Vitals:   03/01/17 0735 03/01/17 0738 03/01/17 1013 03/01/17 1114  BP:      Pulse:  98 94 84  Resp:  (!) 24 (!) 31 (!) 25  Temp:  98.8 F (37.1 C) 99 F (37.2 C) 98.8 F (37.1 C)  TempSrc:      SpO2: 94% 94% 97% 97%  Weight:      Height:        Intake/Output Summary (Last 24 hours) at 03/01/2017 1122 Last data filed at 03/01/2017 0516 Gross per 24 hour  Intake 110 ml  Output 2076 ml  Net -1966 ml   Filed Weights   02/27/17 0500 02/28/17 0500 03/01/17 0419  Weight: 79 kg (174 lb 2.6 oz) 78.7 kg (173 lb 8 oz) 74.1 kg (163 lb 5.8 oz)    Examination:  General exam: Appears somnolent, moans when she is moved Respiratory system: Diminished breath sounds bilaterally, occasional rhonchi Cardiovascular system: S1 & S2 heard, RRR. No JVD, murmurs, rubs, gallops or clicks. No pedal edema. Gastrointestinal system: Abdomen is nondistended, soft and nontender. No organomegaly or masses felt. Normal bowel sounds heard. Central nervous system:. No focal neurological deficits. Extremities: Symmetric 5 x 5 power. Skin: No rashes, lesions or ulcers Psychiatry: somnolent, does not answer questions    Data Reviewed: I have personally reviewed following labs and imaging studies  CBC: Recent Labs  Lab 02/25/17 0450 02/26/17 0523 02/27/17 0416 02/28/17 0626 03/01/17 0534  WBC 9.3 11.4* 12.7* 8.0 13.3*  NEUTROABS  --   --  10.8* 6.4 10.8*  HGB  11.0* 11.1* 12.0 11.9* 12.6  HCT 35.8* 35.8* 38.6 37.6 40.3  MCV 93.2 92.7 92.1 90.8 90.4  PLT 172 181 214 191 831   Basic Metabolic Panel: Recent Labs  Lab 02/22/17 1658 02/23/17 0516 02/23/17 1644  02/25/17 0450 02/26/17 0523 02/27/17 0416 02/28/17 0626 03/01/17 0534  NA  --  141  --    < > 147* 147* 143 139 141  K  --  4.0  --    < > 4.1 4.3 3.7 4.3 3.9  CL  --  102  --    < > 106 106 96* 97* 99*  CO2  --  29  --    < > 32 32 36* 34*  32  GLUCOSE  --  131*  --    < > 145* 145* 116* 117* 95  BUN  --  42*  --    < > 39* 35* 30* 28* 30*  CREATININE  --  1.11*  --    < > 0.80 0.76 0.81 0.74 0.72  CALCIUM  --  8.2*  --    < > 8.2* 8.4* 8.3* 8.3* 8.5*  MG 1.9 1.9 2.0  --   --   --   --   --   --   PHOS 3.0 3.5 2.8  --   --   --   --   --   --    < > = values in this interval not displayed.   GFR: Estimated Creatinine Clearance: 69.7 mL/min (by C-G formula based on SCr of 0.72 mg/dL). Liver Function Tests: Recent Labs  Lab 02/27/17 0416 02/28/17 0626 03/01/17 0534  AST 80* 44* 35  ALT 178* 123* 105*  ALKPHOS 69 56 60  BILITOT 1.0 1.0 0.9  PROT 6.1* 5.5* 6.2*  ALBUMIN 2.7* 2.5* 2.8*   No results for input(s): LIPASE, AMYLASE in the last 168 hours. No results for input(s): AMMONIA in the last 168 hours. Coagulation Profile: No results for input(s): INR, PROTIME in the last 168 hours. Cardiac Enzymes: No results for input(s): CKTOTAL, CKMB, CKMBINDEX, TROPONINI in the last 168 hours. BNP (last 3 results) No results for input(s): PROBNP in the last 8760 hours. HbA1C: No results for input(s): HGBA1C in the last 72 hours. CBG: Recent Labs  Lab 02/28/17 1611 02/28/17 2012 03/01/17 0024 03/01/17 0418 03/01/17 0730  GLUCAP 98 93 104* 98 95   Lipid Profile: No results for input(s): CHOL, HDL, LDLCALC, TRIG, CHOLHDL, LDLDIRECT in the last 72 hours. Thyroid Function Tests: No results for input(s): TSH, T4TOTAL, FREET4, T3FREE, THYROIDAB in the last 72 hours. Anemia Panel: No results for input(s): VITAMINB12, FOLATE, FERRITIN, TIBC, IRON, RETICCTPCT in the last 72 hours. Sepsis Labs: No results for input(s): PROCALCITON, LATICACIDVEN in the last 168 hours.  Recent Results (from the past 240 hour(s))  Blood Culture (routine x 2)     Status: None   Collection Time: 02/19/17  3:39 PM  Result Value Ref Range Status   Specimen Description LEFT ANTECUBITAL  Final   Special Requests   Final    BOTTLES DRAWN AEROBIC  AND ANAEROBIC Blood Culture adequate volume   Culture   Final    NO GROWTH 5 DAYS Performed at [facility], [address], [place], [place] [address]    Report Status 02/24/2017 FINAL  Final  Blood Culture (routine x 2)     Status: None   Collection Time: 02/19/17  3:44 PM  Result Value Ref Range Status  Specimen Description BLOOD RIGHT HAND  Final   Special Requests   Final    BOTTLES DRAWN AEROBIC AND ANAEROBIC Blood Culture adequate volume   Culture   Final    NO GROWTH 5 DAYS Performed at [facility], [address] [place], [place] [address]    Report Status 02/24/2017 FINAL  Final  MRSA PCR Screening     Status: None   Collection Time: 02/20/17 12:01 AM  Result Value Ref Range Status   MRSA by PCR NEGATIVE NEGATIVE Final    Comment:        The GeneXpert MRSA Assay (FDA approved for NASAL specimens only), is one component of a comprehensive MRSA colonization surveillance program. It is not intended to diagnose MRSA infection nor to guide or monitor treatment for MRSA infections. Performed at [facility], [address] [place], [place] [address]          Radiology Studies: No results found.      Scheduled Meds: . chlorhexidine  15 mL Mouth Rinse BID  . enoxaparin (LOVENOX) injection  80 mg Subcutaneous Q12H  . famotidine  20 mg Oral Daily  . ipratropium-albuterol  3 mL Nebulization Q6H  . levothyroxine  62.5 mcg Intravenous Daily  . mouth rinse  15 mL Mouth Rinse q12n4p  . methylPREDNISolone (SOLU-MEDROL) injection  60 mg Intravenous Q8H   Continuous Infusions: . sodium chloride       LOS: 10 days    Time spent: 76mins    Kathie Dike, MD [facility] Pager [phone]  If 7PM-7AM, please contact night-coverage [email or web address] Password [facility] 03/01/2017, 11:22 AM

## 2017-03-01 NOTE — Progress Notes (Signed)
Subjective: She is doing okay on BiPAP.  She is much more alert and she is talking to me today.  She came to the hospital with COPD exacerbation and acute hypoxic and hypercapnic respiratory failure.  I think she has chronic hypercapnic respiratory failure as well.  She was able to be extubated and has required BiPAP at night but is much improved today  Objective: Vital signs in last 24 hours: Temp:  [96.9 F (36.1 C)-98.8 F (37.1 C)] 98.8 F (37.1 C) (02/21 0738) Pulse Rate:  [66-100] 98 (02/21 0738) Resp:  [22-32] 24 (02/21 0738) BP: (117-163)/(52-113) 155/72 (02/20 2000) SpO2:  [85 %-100 %] 94 % (02/21 0738) FiO2 (%):  [35 %] 35 % (02/21 0735) Weight:  [74.1 kg (163 lb 5.8 oz)] 74.1 kg (163 lb 5.8 oz) (02/21 0419) Weight change: -4.6 kg (-2.3 oz) Last BM Date: 02/28/17  Intake/Output from previous day: 02/20 0701 - 02/21 0700 In: 340 [I.V.:110] Out: 2076 [Urine:2075; Stool:1]  PHYSICAL EXAM General appearance: alert and cooperative Resp: rhonchi bilaterally Cardio: regular rate and rhythm, S1, S2 normal, no murmur, click, rub or gallop GI: soft, non-tender; bowel sounds normal; no masses,  no organomegaly Extremities: extremities normal, atraumatic, no cyanosis or edema  Lab Results:  Results for orders placed or performed during the hospital encounter of 02/19/17 (from the past 48 hour(s))  Glucose, capillary     Status: Abnormal   Collection Time: 02/27/17 11:58 AM  Result Value Ref Range   Glucose-Capillary 110 (H) 65 - 99 mg/dL  Glucose, capillary     Status: Abnormal   Collection Time: 02/27/17  4:46 PM  Result Value Ref Range   Glucose-Capillary 110 (H) 65 - 99 mg/dL  Glucose, capillary     Status: Abnormal   Collection Time: 02/27/17  7:45 PM  Result Value Ref Range   Glucose-Capillary 105 (H) 65 - 99 mg/dL  Glucose, capillary     Status: Abnormal   Collection Time: 02/28/17  1:24 AM  Result Value Ref Range   Glucose-Capillary 123 (H) 65 - 99 mg/dL   Glucose, capillary     Status: Abnormal   Collection Time: 02/28/17  4:42 AM  Result Value Ref Range   Glucose-Capillary 109 (H) 65 - 99 mg/dL  CBC with Differential/Platelet     Status: Abnormal   Collection Time: 02/28/17  6:26 AM  Result Value Ref Range   WBC 8.0 4.0 - 10.5 K/uL   RBC 4.14 3.87 - 5.11 MIL/uL   Hemoglobin 11.9 (L) 12.0 - 15.0 g/dL   HCT 37.6 36.0 - 46.0 %   MCV 90.8 78.0 - 100.0 fL   MCH 28.7 26.0 - 34.0 pg   MCHC 31.6 30.0 - 36.0 g/dL   RDW 15.6 (H) 11.5 - 15.5 %   Platelets 191 150 - 400 K/uL   Neutrophils Relative % 80 %   Neutro Abs 6.4 1.7 - 7.7 K/uL   Lymphocytes Relative 10 %   Lymphs Abs 0.8 0.7 - 4.0 K/uL   Monocytes Relative 10 %   Monocytes Absolute 0.8 0.1 - 1.0 K/uL   Eosinophils Relative 0 %   Eosinophils Absolute 0.0 0.0 - 0.7 K/uL   Basophils Relative 0 %   Basophils Absolute 0.0 0.0 - 0.1 K/uL    Comment: Performed at [facility], [address] [place], [place] [address]  Comprehensive metabolic panel     Status: Abnormal   Collection Time: 02/28/17  6:26 AM  Result Value Ref Range  Sodium 139 135 - 145 mmol/L   Potassium 4.3 3.5 - 5.1 mmol/L   Chloride 97 (L) 101 - 111 mmol/L   CO2 34 (H) 22 - 32 mmol/L   Glucose, Bld 117 (H) 65 - 99 mg/dL   BUN 28 (H) 6 - 20 mg/dL   Creatinine, Ser 0.74 0.44 - 1.00 mg/dL   Calcium 8.3 (L) 8.9 - 10.3 mg/dL   Total Protein 5.5 (L) 6.5 - 8.1 g/dL   Albumin 2.5 (L) 3.5 - 5.0 g/dL   AST 44 (H) 15 - 41 U/L   ALT 123 (H) 14 - 54 U/L   Alkaline Phosphatase 56 38 - 126 U/L   Total Bilirubin 1.0 0.3 - 1.2 mg/dL   GFR calc non Af Amer >60 >60 mL/min   GFR calc Af Amer >60 >60 mL/min    Comment: (NOTE) The eGFR has been calculated using the CKD EPI equation. This calculation has not been validated in all clinical situations. eGFR's persistently <60 mL/min signify possible Chronic Kidney Disease.    Anion gap 8 5 - 15    Comment: Performed at [facility], [address], [place], [place] [address]   Glucose, capillary     Status: Abnormal   Collection Time: 02/28/17  7:39 AM  Result Value Ref Range   Glucose-Capillary 122 (H) 65 - 99 mg/dL  Glucose, capillary     Status: Abnormal   Collection Time: 02/28/17 11:04 AM  Result Value Ref Range   Glucose-Capillary 104 (H) 65 - 99 mg/dL  Glucose, capillary     Status: None   Collection Time: 02/28/17  4:11 PM  Result Value Ref Range   Glucose-Capillary 98 65 - 99 mg/dL   Comment 1 Notify RN    Comment 2 Document in Chart   Glucose, capillary     Status: None   Collection Time: 02/28/17  8:12 PM  Result Value Ref Range   Glucose-Capillary 93 65 - 99 mg/dL   Comment 1 Notify RN    Comment 2 Document in Chart   Glucose, capillary     Status: Abnormal   Collection Time: 03/01/17 12:24 AM  Result Value Ref Range   Glucose-Capillary 104 (H) 65 - 99 mg/dL   Comment 1 Notify RN    Comment 2 Document in Chart   Glucose, capillary     Status: None   Collection Time: 03/01/17  4:18 AM  Result Value Ref Range   Glucose-Capillary 98 65 - 99 mg/dL   Comment 1 Notify RN    Comment 2 Document in Chart   CBC with Differential/Platelet     Status: Abnormal   Collection Time: 03/01/17  5:34 AM  Result Value Ref Range   WBC 13.3 (H) 4.0 - 10.5 K/uL   RBC 4.46 3.87 - 5.11 MIL/uL   Hemoglobin 12.6 12.0 - 15.0 g/dL   HCT 40.3 36.0 - 46.0 %   MCV 90.4 78.0 - 100.0 fL   MCH 28.3 26.0 - 34.0 pg   MCHC 31.3 30.0 - 36.0 g/dL   RDW 15.6 (H) 11.5 - 15.5 %   Platelets 247 150 - 400 K/uL   Neutrophils Relative % 81 %   Neutro Abs 10.8 (H) 1.7 - 7.7 K/uL   Lymphocytes Relative 8 %   Lymphs Abs 1.0 0.7 - 4.0 K/uL   Monocytes Relative 11 %   Monocytes Absolute 1.5 (H) 0.1 - 1.0 K/uL   Eosinophils Relative 0 %   Eosinophils Absolute 0.0 0.0 -  0.7 K/uL   Basophils Relative 0 %   Basophils Absolute 0.0 0.0 - 0.1 K/uL    Comment: Performed at [facility], [address], [place], [place] [address]  Comprehensive metabolic panel     Status: Abnormal    Collection Time: 03/01/17  5:34 AM  Result Value Ref Range   Sodium 141 135 - 145 mmol/L   Potassium 3.9 3.5 - 5.1 mmol/L   Chloride 99 (L) 101 - 111 mmol/L   CO2 32 22 - 32 mmol/L   Glucose, Bld 95 65 - 99 mg/dL   BUN 30 (H) 6 - 20 mg/dL   Creatinine, Ser 0.72 0.44 - 1.00 mg/dL   Calcium 8.5 (L) 8.9 - 10.3 mg/dL   Total Protein 6.2 (L) 6.5 - 8.1 g/dL   Albumin 2.8 (L) 3.5 - 5.0 g/dL   AST 35 15 - 41 U/L   ALT 105 (H) 14 - 54 U/L   Alkaline Phosphatase 60 38 - 126 U/L   Total Bilirubin 0.9 0.3 - 1.2 mg/dL   GFR calc non Af Amer >60 >60 mL/min   GFR calc Af Amer >60 >60 mL/min    Comment: (NOTE) The eGFR has been calculated using the CKD EPI equation. This calculation has not been validated in all clinical situations. eGFR's persistently <60 mL/min signify possible Chronic Kidney Disease.    Anion gap 10 5 - 15    Comment: Performed at [facility], [address], [place], [place] [address]  Glucose, capillary     Status: None   Collection Time: 03/01/17  7:30 AM  Result Value Ref Range   Glucose-Capillary 95 65 - 99 mg/dL    ABGS Recent Labs    02/26/17 1800  PHART 7.39  PO2ART 71.4*  HCO3 35.7*   CULTURES Recent Results (from the past 240 hour(s))  Blood Culture (routine x 2)     Status: None   Collection Time: 02/19/17  3:39 PM  Result Value Ref Range Status   Specimen Description LEFT ANTECUBITAL  Final   Special Requests   Final    BOTTLES DRAWN AEROBIC AND ANAEROBIC Blood Culture adequate volume   Culture   Final    NO GROWTH 5 DAYS Performed at [facility], [address], [place], [place] [address]    Report Status 02/24/2017 FINAL  Final  Blood Culture (routine x 2)     Status: None   Collection Time: 02/19/17  3:44 PM  Result Value Ref Range Status   Specimen Description BLOOD RIGHT HAND  Final   Special Requests   Final    BOTTLES DRAWN AEROBIC AND ANAEROBIC Blood Culture adequate volume   Culture   Final    NO GROWTH 5 DAYS Performed at [facility], [address], [place], [place] [address]    Report Status 02/24/2017 FINAL  Final  MRSA PCR Screening     Status: None   Collection Time: 02/20/17 12:01 AM  Result Value Ref Range Status   MRSA by PCR NEGATIVE NEGATIVE Final    Comment:        The GeneXpert MRSA Assay (FDA approved for NASAL specimens only), is one component of a comprehensive MRSA colonization surveillance program. It is not intended to diagnose MRSA infection nor to guide or monitor treatment for MRSA infections. Performed at [facility], [address] [place],  [address]    Studies/Results: No results found.  Medications:  Prior to Admission:  Medications Prior to Admission  Medication  Sig Dispense Refill Last Dose  . levothyroxine (SYNTHROID, LEVOTHROID) 125 MCG tablet take 1 tablet by mouth once daily 90 tablet 2 unknown  . warfarin (COUMADIN) 1 MG tablet Take 1 tablet (67m) on Tuesdays and Saturdays along with a 560mtablet (Patient taking differently: Take 1 mg by mouth 2 (two) times a week. Take 1 tablet (2m90mon Tuesdays and Fridays along with a 5mg76mblet) 30 tablet 4 unknown  . warfarin (COUMADIN) 5 MG tablet Take 1 tablet (5 mg total) by mouth daily. 90 tablet 3 unknown  . albuterol (PROVENTIL) (2.5 MG/3ML) 0.083% nebulizer solution USE 1 VIAL BY NEBULIZER EVERY 4 HOURS AS NEEDED FOR WHEEZING. DX: J44.9 375 vial 0   . budesonide (PULMICORT) 0.5 MG/2ML nebulizer solution Take 2 mLs (0.5 mg total) by nebulization 2 (two) times daily. Dx: J43.9 120 mL 3   . fentaNYL (DURAGESIC - DOSED MCG/HR) 50 MCG/HR Place 50 mcg onto the skin every 3 (three) days.   Taking  . fish oil-omega-3 fatty acids 1000 MG capsule Take 2 g by mouth daily.     Taking  . ipratropium-albuterol (DUONEB) 0.5-2.5 (3) MG/3ML SOLN 1 vial via nebulizer at breakfast, lunch, dinner and bedtime daily. May take 2 additional treatments on bad day Dx: J43.9 360 mL 3   . Magnesium 500 MG CAPS Take 500 mg by mouth.   Taking  .  nitroGLYCERIN (NITROSTAT) 0.4 MG SL tablet Place 1 tablet (0.4 mg total) under the tongue every 5 (five) minutes as needed for chest pain. 25 tablet 3 Taking  . Respiratory Therapy Supplies (FLUTTER) DEVI Use as directed 1 each 0 Taking  . ticagrelor (BRILINTA) 60 MG TABS tablet Take 1 tablet (60 mg total) by mouth 2 (two) times daily. (Patient not taking: Reported on 01/24/2017) 60 tablet 11 Not Taking   Scheduled: . chlorhexidine  15 mL Mouth Rinse BID  . enoxaparin (LOVENOX) injection  80 mg Subcutaneous Q12H  . famotidine  20 mg Oral Daily  . ipratropium-albuterol  3 mL Nebulization Q6H  . levothyroxine  62.5 mcg Intravenous Daily  . mouth rinse  15 mL Mouth Rinse q12n4p  . methylPREDNISolone (SOLU-MEDROL) injection  60 mg Intravenous Q8H   Continuous: . sodium chloride     PRN:JOI:TGPQDIoride, albuterol, LORazepam, metoprolol tartrate  Assesment: She was admitted with COPD exacerbation, influenza A and acute hypoxic and hypercapnic respiratory failure.  I believe she has chronic hypercapnic respiratory failure at baseline.  She is much improved and has been able to be extubated but had been having trouble with metabolic encephalopathy which seems better now.  She is more alert.  Still high risk of reintubation because of potential aspiration Principal Problem:   COPD exacerbation (HCC)[place] Problems:   Tachycardia   Influenza A   Acute respiratory failure (HCC)    Plan: Continue treatments    LOS: 10 days   Jerrin Recore L 03/01/2017, 9:43 AM

## 2017-03-01 NOTE — Progress Notes (Signed)
  Speech Language Pathology Treatment: Dysphagia  Patient Details Name: Mairely Foxworth MRN: [id number] DOB: [birth year] Today's Date: 03/01/2017 Time: [id number] SLP Time Calculation (min) (ACUTE ONLY): 19 min  Assessment / Plan / Recommendation Clinical Impression  Pt with improved alertness this visit and tells SLP that she wants to go home to her two dogs (Abby and Ms Peabody) and two cats. She reports that she does live with someone who is a Administrator. Pt denies difficulty swallowing prior to admission. Pt readily consumed foods and liquids offered, however presented with delayed coughing over trials. Recommend continue NPO tonight, but ok to offer small sips of water/ice chips and po meds crushed as able in puree and plan to complete MBSS tomorrow AM if Pt alert. Above to RN and MD.   HPI HPI: Shanon Seawright  is a 67 y.o. female, w hx of CAD, Hypothyroidism, PE, Factor V leiden, Copd on home o2, apparently c/o increase in dyspnea for 24 hours prior to admission.  Slight cough, dry-yellow sputum.  Denies fever, chills, cp, palp, n/v, diarrhea, brbpr, black stool.  Pt presents to ED due to increase in dyspnea.      SLP Plan  MBS(if alert, will complete tomorrow AM)       Recommendations  Medication Administration: Crushed with puree                Oral Care Recommendations: Oral care QID Follow up Recommendations: Skilled Nursing facility SLP Visit Diagnosis: Dysphagia, oropharyngeal phase (R13.12) Plan: MBS(if alert, will complete tomorrow AM)       Thank you,  Genene Churn, [place]                 [place] 03/01/2017, 7:19 PM

## 2017-03-01 NOTE — Progress Notes (Signed)
**Note De-Identified  Obfuscation** Patient removed from BIPAP and placed on 5 L [place]; tolerating well.   RT to continue to monitor.

## 2017-03-02 ENCOUNTER — Inpatient Hospital Stay (HOSPITAL_COMMUNITY): Payer: Medicare Other

## 2017-03-02 LAB — COMPREHENSIVE METABOLIC PANEL
ALBUMIN: 2.6 g/dL — AB (ref 3.5–5.0)
ALT: 104 U/L — AB (ref 14–54)
ANION GAP: 9 (ref 5–15)
AST: 50 U/L — ABNORMAL HIGH (ref 15–41)
Alkaline Phosphatase: 58 U/L (ref 38–126)
BILIRUBIN TOTAL: 1 mg/dL (ref 0.3–1.2)
BUN: 29 mg/dL — AB (ref 6–20)
CALCIUM: 8.2 mg/dL — AB (ref 8.9–10.3)
CO2: 31 mmol/L (ref 22–32)
CREATININE: 0.68 mg/dL (ref 0.44–1.00)
Chloride: 102 mmol/L (ref 101–111)
GFR calc Af Amer: >60 mL/min (ref >60)
GFR calc non Af Amer: >60 mL/min (ref >60)
GLUCOSE: 121 mg/dL — AB (ref 65–99)
Potassium: 4.3 mmol/L (ref 3.5–5.1)
Sodium: 142 mmol/L (ref 135–145)
TOTAL PROTEIN: 5.7 g/dL — AB (ref 6.5–8.1)

## 2017-03-02 LAB — CBC WITH DIFFERENTIAL/PLATELET
BASOS PCT: 0 %
Basophils Absolute: 0 10*3/uL (ref 0.0–0.1)
Eosinophils Absolute: 0 10*3/uL (ref 0.0–0.7)
Eosinophils Relative: 0 %
HEMATOCRIT: 40.2 % (ref 36.0–46.0)
Hemoglobin: 12.5 g/dL (ref 12.0–15.0)
LYMPHS ABS: 0.7 10*3/uL (ref 0.7–4.0)
Lymphocytes Relative: 8 %
MCH: 28.1 pg (ref 26.0–34.0)
MCHC: 31.1 g/dL (ref 30.0–36.0)
MCV: 90.3 fL (ref 78.0–100.0)
MONOS PCT: 8 %
Monocytes Absolute: 0.7 10*3/uL (ref 0.1–1.0)
NEUTROS ABS: 7.2 10*3/uL (ref 1.7–7.7)
NEUTROS PCT: 84 %
Platelets: 216 10*3/uL (ref 150–400)
RBC: 4.45 MIL/uL (ref 3.87–5.11)
RDW: 15.4 % (ref 11.5–15.5)
WBC: 8.6 10*3/uL (ref 4.0–10.5)

## 2017-03-02 LAB — GLUCOSE, CAPILLARY
GLUCOSE-CAPILLARY: 130 mg/dL — AB (ref 65–99)
Glucose-Capillary: 104 mg/dL — ABNORMAL HIGH (ref 65–99)
Glucose-Capillary: 105 mg/dL — ABNORMAL HIGH (ref 65–99)
Glucose-Capillary: 120 mg/dL — ABNORMAL HIGH (ref 65–99)
Glucose-Capillary: 157 mg/dL — ABNORMAL HIGH (ref 65–99)
Glucose-Capillary: 171 mg/dL — ABNORMAL HIGH (ref 65–99)

## 2017-03-02 MED ORDER — WARFARIN - PHARMACIST DOSING INPATIENT: Status: DC

## 2017-03-02 MED ORDER — LORAZEPAM 2 MG/ML IJ SOLN
0.5000 mg | Freq: Four times a day (QID) | INTRAMUSCULAR | Status: DC | PRN
  Administered 2017-03-02 (×2): 0.5 mg via INTRAVENOUS
  Filled 2017-03-02 (×2): qty 1

## 2017-03-02 MED ORDER — WARFARIN SODIUM 5 MG PO TABS
5.0000 mg | ORAL_TABLET | Freq: Once | ORAL | Status: AC
  Administered 2017-03-02: 5 mg via ORAL
  Filled 2017-03-02: qty 1

## 2017-03-02 MED ORDER — LEVOTHYROXINE SODIUM 100 MCG PO TABS
125.0000 ug | ORAL_TABLET | Freq: Every day | ORAL | Status: DC
  Administered 2017-03-03 – 2017-03-05 (×3): 125 ug via ORAL
  Filled 2017-03-02 (×3): qty 1

## 2017-03-02 MED ORDER — TICAGRELOR 60 MG PO TABS
60.0000 mg | ORAL_TABLET | Freq: Two times a day (BID) | ORAL | Status: DC
  Administered 2017-03-02 – 2017-03-04 (×6): 60 mg via ORAL
  Filled 2017-03-02 (×12): qty 1

## 2017-03-02 NOTE — Progress Notes (Signed)
PROGRESS NOTE    [name]  GMW:[id number] DOB: [birth year] DOA: 02/19/2017 PCP: [name], NP    Brief Narrative:  [name] y.o.female,w hx of CAD, Hypothyroidism, PE, Factor V leiden, Copd on home o2, apparently c/o increase in dyspnea for 24 hours prior to admission. Slight cough, dry-yellow sputum. Denies fever, chills, cp, palp, n/v, diarrhea, brbpr, black stool. Pt presents to ED due to increase in dyspnea.   CXR demonstrated acute on chronic pulmonary interstitial opacity; CTA neg for PE. Positive influenza A  Assessment & Plan:   Principal Problem:   COPD exacerbation ([place]) Active Problems:   Tachycardia   Influenza A   Acute respiratory failure ([place])   1. Acute on chronic respiratory failure with hypoxia and hypercapnia.  Related to influenza infection as well as COPD exacerbation.  Briefly required intubation, currently intermittently requiring BiPAP.  CT chest was negative for pulmonary embolus.  Patient was on BiPAP overnight and appears to be improving. Continue to monitor in the stepdown.   2. Influenza A.  Treated with Tamiflu 3. COPD exacerbation.  Currently on intravenous steroids and bronchodilators.  Pulmonology following.  Clinically, she appears to be improving 4. 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  Currently on full dose Lovenox.  Resume Coumadin now that she is able to take p.o. 5. Hypothyroidism.  Currently on IV Synthroid for now.  Will transition back to oral since she has passed her swallow eval. 6. Dysphagia.  Being followed by speech therapy for swallow evaluation.  Patient underwent modified barium swallow and recommendations are for dysphagia 3 diet. 7. Coronary artery disease.  Previous history of stent.  Since she is now able to take p.o., will restart Brilinta.  No complaints of chest pain.   DVT prophylaxis: Full dose Lovenox, Coumadin Code Status: Full code Family Communication: No family present Disposition  Plan: May need placement.   Consultants:   Pulmonology  Procedures:     Antimicrobials:      Subjective: Feeling better today.  Shortness of breath improving.  Wants to eat.  Objective: Vitals:   03/02/17 0735 03/02/17 0800 03/02/17 0900 03/02/17 1000  BP:  (!) 126/53 125/67 117/61  Pulse:  78 81 77  Resp:  20 (!) 23 19  Temp:  97.9 F (36.6 C) 97.9 F (36.6 C) 97.9 F (36.6 C)  TempSrc:      SpO2: 99% 100% 98% 97%  Weight:      Height:        Intake/Output Summary (Last 24 hours) at 03/02/2017 1157 Last data filed at 03/02/2017 1100 Gross per 24 hour  Intake 0 ml  Output 1700 ml  Net -1700 ml   Filed Weights   02/28/17 0500 03/01/17 0419 03/02/17 0500  Weight: 78.7 kg (173 lb 8 oz) 74.1 kg (163 lb 5.8 oz) 71.8 kg (158 lb 4.6 oz)    Examination:  General exam: Awake and alert.  No signs of distress Respiratory system: Occasional rhonchi.  No wheezing. Cardiovascular system: S1 & S2 heard, RRR. No JVD, murmurs, rubs, gallops or clicks. No pedal edema. Gastrointestinal system: Abdomen is nondistended, soft and nontender. No organomegaly or masses felt. Normal bowel sounds heard. Central nervous system:. No focal neurological deficits. Extremities: Symmetric 5 x 5 power. Skin: No rashes, lesions or ulcers Psychiatry: Pleasant, cooperative    Data Reviewed: I have personally reviewed following labs and imaging studies  CBC: Recent Labs  Lab 02/26/17 0523 02/27/17 0416 02/28/17 0626 03/01/17 0534 03/02/17 0432  WBC  11.4* 12.7* 8.0 13.3* 8.6  NEUTROABS  --  10.8* 6.4 10.8* 7.2  HGB 11.1* 12.0 11.9* 12.6 12.5  HCT 35.8* 38.6 37.6 40.3 40.2  MCV 92.7 92.1 90.8 90.4 90.3  PLT 181 214 191 247 852   Basic Metabolic Panel: Recent Labs  Lab 02/23/17 1644  02/26/17 0523 02/27/17 0416 02/28/17 0626 03/01/17 0534 03/02/17 0432  NA  --    < > 147* 143 139 141 142  K  --    < > 4.3 3.7 4.3 3.9 4.3  CL  --    < > 106 96* 97* 99* 102  CO2  --    < >  32 36* 34* 32 31  GLUCOSE  --    < > 145* 116* 117* 95 121*  BUN  --    < > 35* 30* 28* 30* 29*  CREATININE  --    < > 0.76 0.81 0.74 0.72 0.68  CALCIUM  --    < > 8.4* 8.3* 8.3* 8.5* 8.2*  MG 2.0  --   --   --   --   --   --   PHOS 2.8  --   --   --   --   --   --    < > = values in this interval not displayed.   GFR: Estimated Creatinine Clearance: 68.7 mL/min (by C-G formula based on SCr of 0.68 mg/dL). Liver Function Tests: Recent Labs  Lab 02/27/17 0416 02/28/17 0626 03/01/17 0534 03/02/17 0432  AST 80* 44* 35 50*  ALT 178* 123* 105* 104*  ALKPHOS 69 56 60 58  BILITOT 1.0 1.0 0.9 1.0  PROT 6.1* 5.5* 6.2* 5.7*  ALBUMIN 2.7* 2.5* 2.8* 2.6*   No results for input(s): LIPASE, AMYLASE in the last 168 hours. No results for input(s): AMMONIA in the last 168 hours. Coagulation Profile: No results for input(s): INR, PROTIME in the last 168 hours. Cardiac Enzymes: No results for input(s): CKTOTAL, CKMB, CKMBINDEX, TROPONINI in the last 168 hours. BNP (last 3 results) No results for input(s): PROBNP in the last 8760 hours. HbA1C: No results for input(s): HGBA1C in the last 72 hours. CBG: Recent Labs  Lab 03/01/17 1618 03/01/17 1939 03/01/17 2347 03/02/17 0401 03/02/17 0744  GLUCAP 92 152* 120* 104* 105*   Lipid Profile: No results for input(s): CHOL, HDL, LDLCALC, TRIG, CHOLHDL, LDLDIRECT in the last 72 hours. Thyroid Function Tests: No results for input(s): TSH, T4TOTAL, FREET4, T3FREE, THYROIDAB in the last 72 hours. Anemia Panel: No results for input(s): VITAMINB12, FOLATE, FERRITIN, TIBC, IRON, RETICCTPCT in the last 72 hours. Sepsis Labs: No results for input(s): PROCALCITON, LATICACIDVEN in the last 168 hours.  No results found for this or any previous visit (from the past 240 hour(s)).       Radiology Studies: No results found.      Scheduled Meds: . chlorhexidine  15 mL Mouth Rinse BID  . enoxaparin (LOVENOX) injection  80 mg Subcutaneous Q12H    . famotidine  20 mg Oral Daily  . ipratropium-albuterol  3 mL Nebulization Q6H  . levothyroxine  62.5 mcg Intravenous Daily  . mouth rinse  15 mL Mouth Rinse q12n4p  . methylPREDNISolone (SOLU-MEDROL) injection  60 mg Intravenous Q8H   Continuous Infusions: . sodium chloride       LOS: 11 days    Time spent: 47mins    Kathie Dike, MD [facility] Pager [phone]  If 7PM-7AM, please contact night-coverage [email or web address] Password [email or web address]  03/02/2017, 11:57 AM

## 2017-03-02 NOTE — Progress Notes (Signed)
Subjective: She is on BiPAP.  She is sleepy this morning.  She has had waxing and waning mental status as she was awake when I saw her yesterday morning then became much more sluggish and poorly responsive yesterday later in the morning and then was much more alert yesterday evening.  Objective: Vital signs in last 24 hours: Temp:  [98.6 F (37 C)-99.5 F (37.5 C)] 99.3 F (37.4 C) (02/21 2300) Pulse Rate:  [72-98] 83 (02/21 2300) Resp:  [20-31] 26 (02/21 2300) BP: (104-160)/(56-93) 122/74 (02/21 2300) SpO2:  [90 %-100 %] 97 % (02/22 0255) FiO2 (%):  [35 %] 35 % (02/22 0255) Weight:  [71.8 kg (158 lb 4.6 oz)] 71.8 kg (158 lb 4.6 oz) (02/22 0500) Weight change: -2.3 kg (-1.1 oz) Last BM Date: 03/01/17  Intake/Output from previous day: 02/21 0701 - 02/22 0700 In: -  Out: 1700 [Urine:1700]  PHYSICAL EXAM General appearance: Sleepy when I saw her but more alert now Resp: rhonchi bilaterally Cardio: regular rate and rhythm, S1, S2 normal, no murmur, click, rub or gallop GI: soft, non-tender; bowel sounds normal; no masses,  no organomegaly Extremities: extremities normal, atraumatic, no cyanosis or edema  Lab Results:  Results for orders placed or performed during the hospital encounter of 02/19/17 (from the past 48 hour(s))  Glucose, capillary     Status: Abnormal   Collection Time: 02/28/17  7:39 AM  Result Value Ref Range   Glucose-Capillary 122 (H) 65 - 99 mg/dL  Glucose, capillary     Status: Abnormal   Collection Time: 02/28/17 11:04 AM  Result Value Ref Range   Glucose-Capillary 104 (H) 65 - 99 mg/dL  Glucose, capillary     Status: None   Collection Time: 02/28/17  4:11 PM  Result Value Ref Range   Glucose-Capillary 98 65 - 99 mg/dL   Comment 1 Notify RN    Comment 2 Document in Chart   Glucose, capillary     Status: None   Collection Time: 02/28/17  8:12 PM  Result Value Ref Range   Glucose-Capillary 93 65 - 99 mg/dL   Comment 1 Notify RN    Comment 2 Document  in Chart   Glucose, capillary     Status: Abnormal   Collection Time: 03/01/17 12:24 AM  Result Value Ref Range   Glucose-Capillary 104 (H) 65 - 99 mg/dL   Comment 1 Notify RN    Comment 2 Document in Chart   Glucose, capillary     Status: None   Collection Time: 03/01/17  4:18 AM  Result Value Ref Range   Glucose-Capillary 98 65 - 99 mg/dL   Comment 1 Notify RN    Comment 2 Document in Chart   CBC with Differential/Platelet     Status: Abnormal   Collection Time: 03/01/17  5:34 AM  Result Value Ref Range   WBC 13.3 (H) 4.0 - 10.5 K/uL   RBC 4.46 3.87 - 5.11 MIL/uL   Hemoglobin 12.6 12.0 - 15.0 g/dL   HCT 40.3 36.0 - 46.0 %   MCV 90.4 78.0 - 100.0 fL   MCH 28.3 26.0 - 34.0 pg   MCHC 31.3 30.0 - 36.0 g/dL   RDW 15.6 (H) 11.5 - 15.5 %   Platelets 247 150 - 400 K/uL   Neutrophils Relative % 81 %   Neutro Abs 10.8 (H) 1.7 - 7.7 K/uL   Lymphocytes Relative 8 %   Lymphs Abs 1.0 0.7 - 4.0 K/uL   Monocytes Relative 11 %  Monocytes Absolute 1.5 (H) 0.1 - 1.0 K/uL   Eosinophils Relative 0 %   Eosinophils Absolute 0.0 0.0 - 0.7 K/uL   Basophils Relative 0 %   Basophils Absolute 0.0 0.0 - 0.1 K/uL    Comment: Performed at [facility], [address] [place], [place] [address]  Comprehensive metabolic panel     Status: Abnormal   Collection Time: 03/01/17  5:34 AM  Result Value Ref Range   Sodium 141 135 - 145 mmol/[name]   Potassium 3.9 3.5 - 5.1 mmol/[name]   Chloride 99 ([name]) 101 - 111 mmol/[name]   CO2 32 22 - 32 mmol/[name]   Glucose, Bld 95 65 - 99 mg/dL   BUN 30 (H) 6 - 20 mg/dL   Creatinine, Ser 0.72 0.44 - 1.00 mg/dL   Calcium 8.5 ([name]) 8.9 - 10.3 mg/dL   Total Protein 6.2 ([name]) 6.5 - 8.1 g/dL   Albumin 2.8 ([name]) 3.5 - 5.0 g/dL   AST 35 15 - 41 U/[name]   ALT 105 (H) 14 - 54 U/[name]   Alkaline Phosphatase 60 38 - 126 U/[name]   Total Bilirubin 0.9 0.3 - 1.2 mg/dL   GFR calc non Af Amer >60 >60 mL/min   GFR calc Af Amer >60 >60 mL/min    Comment: (NOTE) The eGFR has been calculated using the CKD EPI  equation. This calculation has not been validated in all clinical situations. eGFR's persistently <60 mL/min signify possible Chronic Kidney Disease.    Anion gap 10 5 - 15    Comment: Performed at [facility], [address], [place], [place] [address]  Glucose, capillary     Status: None   Collection Time: 03/01/17  7:30 AM  Result Value Ref Range   Glucose-Capillary 95 65 - 99 mg/dL  Glucose, capillary     Status: Abnormal   Collection Time: 03/01/17 11:14 AM  Result Value Ref Range   Glucose-Capillary 110 (H) 65 - 99 mg/dL  Glucose, capillary     Status: None   Collection Time: 03/01/17  4:18 PM  Result Value Ref Range   Glucose-Capillary 92 65 - 99 mg/dL  Glucose, capillary     Status: Abnormal   Collection Time: 03/01/17  7:39 PM  Result Value Ref Range   Glucose-Capillary 152 (H) 65 - 99 mg/dL   Comment 1 Notify RN   Glucose, capillary     Status: Abnormal   Collection Time: 03/01/17 11:47 PM  Result Value Ref Range   Glucose-Capillary 120 (H) 65 - 99 mg/dL   Comment 1 Notify RN   Glucose, capillary     Status: Abnormal   Collection Time: 03/02/17  4:01 AM  Result Value Ref Range   Glucose-Capillary 104 (H) 65 - 99 mg/dL   Comment 1 Notify RN   CBC with Differential/Platelet     Status: None   Collection Time: 03/02/17  4:32 AM  Result Value Ref Range   WBC 8.6 4.0 - 10.5 K/uL   RBC 4.45 3.87 - 5.11 MIL/uL   Hemoglobin 12.5 12.0 - 15.0 g/dL   HCT 40.2 36.0 - 46.0 %   MCV 90.3 78.0 - 100.0 fL   MCH 28.1 26.0 - 34.0 pg   MCHC 31.1 30.0 - 36.0 g/dL   RDW 15.4 11.5 - 15.5 %   Platelets 216 150 - 400 K/uL   Neutrophils Relative % 84 %   Neutro Abs 7.2 1.7 - 7.7 K/uL   Lymphocytes Relative 8 %   Lymphs  Abs 0.7 0.7 - 4.0 K/uL   Monocytes Relative 8 %   Monocytes Absolute 0.7 0.1 - 1.0 K/uL   Eosinophils Relative 0 %   Eosinophils Absolute 0.0 0.0 - 0.7 K/uL   Basophils Relative 0 %   Basophils Absolute 0.0 0.0 - 0.1 K/uL    Comment: Performed at [facility], [address] [place], [place] [address]  Comprehensive metabolic panel     Status: Abnormal   Collection Time: 03/02/17  4:32 AM  Result Value Ref Range   Sodium 142 135 - 145 mmol/[name]   Potassium 4.3 3.5 - 5.1 mmol/[name]   Chloride 102 101 - 111 mmol/[name]   CO2 31 22 - 32 mmol/[name]   Glucose, Bld 121 (H) 65 - 99 mg/dL   BUN 29 (H) 6 - 20 mg/dL   Creatinine, Ser 0.68 0.44 - 1.00 mg/dL   Calcium 8.2 ([name]) 8.9 - 10.3 mg/dL   Total Protein 5.7 ([name]) 6.5 - 8.1 g/dL   Albumin 2.6 ([name]) 3.5 - 5.0 g/dL   AST 50 (H) 15 - 41 U/[name]   ALT 104 (H) 14 - 54 U/[name]   Alkaline Phosphatase 58 38 - 126 U/[name]   Total Bilirubin 1.0 0.3 - 1.2 mg/dL   GFR calc non Af Amer >60 >60 mL/min   GFR calc Af Amer >60 >60 mL/min    Comment: (NOTE) The eGFR has been calculated using the CKD EPI equation. This calculation has not been validated in all clinical situations. eGFR's persistently <60 mL/min signify possible Chronic Kidney Disease.    Anion gap 9 5 - 15    Comment: Performed at [facility], [address], [place], [place] [address]    ABGS No results for input(s): PHART, PO2ART, TCO2, HCO3 in the last 72 hours.  Invalid input(s): PCO2 CULTURES No results found for this or any previous visit (from the past 240 hour(s)). Studies/Results: No results found.  Medications:  Prior to Admission:  Medications Prior to Admission  Medication Sig Dispense Refill Last Dose  . levothyroxine (SYNTHROID, LEVOTHROID) 125 MCG tablet take 1 tablet by mouth once daily 90 tablet 2 unknown  . warfarin (COUMADIN) 1 MG tablet Take 1 tablet (834m) on Tuesdays and Saturdays along with a 55mtablet (Patient taking differently: Take 1 mg by mouth 2 (two) times a week. Take 1 tablet (34m89mon Tuesdays and Fridays along with a 5mg74mblet) 30 tablet 4 unknown  . warfarin (COUMADIN) 5 MG tablet Take 1 tablet (5 mg total) by mouth daily. 90 tablet 3 unknown  . albuterol (PROVENTIL) (2.5 MG/3ML) 0.083% nebulizer solution USE 1 VIAL BY NEBULIZER  EVERY 4 HOURS AS NEEDED FOR WHEEZING. DX: J44.9 375 vial 0   . budesonide (PULMICORT) 0.5 MG/2ML nebulizer solution Take 2 mLs (0.5 mg total) by nebulization 2 (two) times daily. Dx: J43.9 120 mL 3   . fentaNYL (DURAGESIC - DOSED MCG/HR) 50 MCG/HR Place 50 mcg onto the skin every 3 (three) days.   Taking  . fish oil-omega-3 fatty acids 1000 MG capsule Take 2 g by mouth daily.     Taking  . ipratropium-albuterol (DUONEB) 0.5-2.5 (3) MG/3ML SOLN 1 vial via nebulizer at breakfast, lunch, dinner and bedtime daily. May take 2 additional treatments on bad day Dx: J43.9 360 mL 3   . Magnesium 500 MG CAPS Take 500 mg by mouth.   Taking  . nitroGLYCERIN (NITROSTAT) 0.4 MG SL tablet Place 1 tablet (0.4 mg total) under the tongue every 5 (  five) minutes as needed for chest pain. 25 tablet 3 Taking  . Respiratory Therapy Supplies (FLUTTER) DEVI Use as directed 1 each 0 Taking  . ticagrelor (BRILINTA) 60 MG TABS tablet Take 1 tablet (60 mg total) by mouth 2 (two) times daily. (Patient not taking: Reported on 01/24/2017) 60 tablet 11 Not Taking   Scheduled: . chlorhexidine  15 mL Mouth Rinse BID  . enoxaparin (LOVENOX) injection  80 mg Subcutaneous Q12H  . famotidine  20 mg Oral Daily  . ipratropium-albuterol  3 mL Nebulization Q6H  . levothyroxine  62.5 mcg Intravenous Daily  . mouth rinse  15 mL Mouth Rinse q12n4p  . methylPREDNISolone (SOLU-MEDROL) injection  60 mg Intravenous Q8H   Continuous: . sodium chloride     BWG:YKZLDJ chloride, albuterol, LORazepam, metoprolol tartrate  Assesment: She was admitted with influenza A and respiratory failure.  She was septic initially and that has resolved.  Her PCO2 kept going up despite BiPAP and she required intubation and mechanical ventilation but is now off the ventilator.  She does use BiPAP/NIV at night.  We do not know what her baseline status is with COPD but it appears that she does have chronic hypercapnic respiratory failure.  She has had waxing and  waning mental status perhaps related to medications and her illness  She had acute on chronic diastolic heart failure which is better Principal Problem:   COPD exacerbation (HCC) Active Problems:   Tachycardia   Influenza A   Acute respiratory failure ([place])    Plan: Continue treatments    LOS: 11 days   [name] 03/02/2017, 7:36 AM

## 2017-03-02 NOTE — Progress Notes (Signed)
Modified Barium Swallow Progress Note  Patient Details  Name: Nya Monds MRN: [id number] Date of Birth: [birth year]  Today's Date: 03/02/2017  Modified Barium Swallow completed.  Full report located under Chart Review in the Imaging Section.  Brief recommendations include the following:  Clinical Impression  Pt presents with min oropharyngeal phase dysphagia characterized by weak lingual manipulation of solids and impaired mastication due to edentulous status resulting in piecemeal deglutition and delayed oral prep with solids, min delay in swallow initiation with swallow trigger after filling the valleculae and spilling to the pyriforms with cup/straw sips thin resulting in variable flash penetration with thins without aspiration. Pt with spontaneous cough with one episode of deeper penetration and it was immediately removed (this also occurred when taking barium tablet). Pt noted to cough at bedside during po trials and suspect this due to element of possible penetration and/or respiratory compromise (COPD). Recommend D3/mech soft with thin liquids with standard aspiration and reflux precautions; po only when Pt is alert and upright. SLP will continue to follow. Recommend PT evaluation to help determine needs post acute.    Swallow Evaluation Recommendations       SLP Diet Recommendations: Dysphagia 3 (Mech soft) solids;Thin liquid   Liquid Administration via: Cup;Straw   Medication Administration: Whole meds with liquid   Supervision: Patient able to self feed;Intermittent supervision to cue for compensatory strategies   Compensations: Slow rate;Small sips/bites;Multiple dry swallows after each bite/sip   Postural Changes: Remain semi-upright after after feeds/meals (Comment);Seated upright at 90 degrees   Oral Care Recommendations: Oral care BID;Staff/trained caregiver to provide oral care   Other Recommendations: Clarify dietary restrictions   Thank you,  Genene Churn,  [place] 03/02/2017,2:37 PM

## 2017-03-02 NOTE — Progress Notes (Addendum)
[facility] for Warfarin >> LOVENOX >> resume Warfarin 2/22 Indication: Chronic warfarin, Hx PE/DVT/+ factor V Leiden  Allergies  Allergen Reactions  . Dilaudid [Hydromorphone Hcl] Hives and Nausea Only  . Minocycline Hcl     REACTION: Dizzy  . Prednisone     REACTION: feels like throat swelling  . Varenicline Tartrate     REACTION: Dizzy(chantix)   . Zocor [Simvastatin - High Dose] Other (See Comments)    myalgia   Patient Measurements: Height: 5\' 5"  (165.1 cm) Weight: 158 lb 4.6 oz (71.8 kg) IBW/kg (Calculated) : 57  Vital Signs: Temp: 97.9 F (36.6 C) (02/22 0800) BP: 126/53 (02/22 0800) Pulse Rate: 78 (02/22 0800)  Labs: Recent Labs    02/28/17 0626 03/01/17 0534 03/02/17 0432  HGB 11.9* 12.6 12.5  HCT 37.6 40.3 40.2  PLT 191 247 216  CREATININE 0.74 0.72 0.68   Estimated Creatinine Clearance: 68.7 mL/min (by C-G formula based on SCr of 0.68 mg/dL).  Medical History: Past Medical History:  Diagnosis Date  . Acute MI ([place])    x4, code blue x3  . Arteriosclerotic cardiovascular disease (ASCVD) 2002   Inf STEMI-2002. 2003-cutting balloon + brachytherapy for restenosis; subsequent acute stent thrombosis 06/2010 requiring 2 separate interventions (Zeta stent, then repeat cath with thrombectomy). focal basal inf AK, nl EF; 03/2011: Patent stents, minor nonobst  residual dz, nl EF; neg stress nuclear in 2008 and stress echo in 2009  . Chronic anticoagulation    Warfarin plus ticagrelor  . Chronic respiratory failure ([place]) 08/27/2013   On 2L 02  . COPD (chronic obstructive pulmonary disease) ([place])    02 dependent  . Factor V Leiden, prothrombin gene mutation ([place]) 2006  . Hyperlipidemia   . Hypothyroidism   . Noncompliance   . Pelvic fracture ([place]) 2009  . Pulmonary embolism ([place]) 2006   Associated with deep vein thrombosis-2006; + factor V Leiden  . Tobacco abuse    50 pack years   Medications:  Medications Prior to  Admission  Medication Sig Dispense Refill Last Dose  . levothyroxine (SYNTHROID, LEVOTHROID) 125 MCG tablet take 1 tablet by mouth once daily 90 tablet 2 unknown  . warfarin (COUMADIN) 1 MG tablet Take 1 tablet (1mg ) on Tuesdays and Saturdays along with a 5mg  tablet (Patient taking differently: Take 1 mg by mouth 2 (two) times a week. Take 1 tablet (1mg ) on Tuesdays and Fridays along with a 5mg  tablet) 30 tablet 4 unknown  . warfarin (COUMADIN) 5 MG tablet Take 1 tablet (5 mg total) by mouth daily. 90 tablet 3 unknown  . albuterol (PROVENTIL) (2.5 MG/3ML) 0.083% nebulizer solution USE 1 VIAL BY NEBULIZER EVERY 4 HOURS AS NEEDED FOR WHEEZING. DX: J44.9 375 vial 0   . budesonide (PULMICORT) 0.5 MG/2ML nebulizer solution Take 2 mLs (0.5 mg total) by nebulization 2 (two) times daily. Dx: J43.9 120 mL 3   . fentaNYL (DURAGESIC - DOSED MCG/HR) 50 MCG/HR Place 50 mcg onto the skin every 3 (three) days.   Taking  . fish oil-omega-3 fatty acids 1000 MG capsule Take 2 g by mouth daily.     Taking  . ipratropium-albuterol (DUONEB) 0.5-2.5 (3) MG/3ML SOLN 1 vial via nebulizer at breakfast, lunch, dinner and bedtime daily. May take 2 additional treatments on bad day Dx: J43.9 360 mL 3   . Magnesium 500 MG CAPS Take 500 mg by mouth.   Taking  . nitroGLYCERIN (NITROSTAT) 0.4 MG SL tablet Place 1 tablet (  0.4 mg total) under the tongue every 5 (five) minutes as needed for chest pain. 25 tablet 3 Taking  . Respiratory Therapy Supplies (FLUTTER) DEVI Use as directed 1 each 0 Taking  . ticagrelor (BRILINTA) 60 MG TABS tablet Take 1 tablet (60 mg total) by mouth 2 (two) times daily. (Patient not taking: Reported on 01/24/2017) 60 tablet 11 Not Taking    Home warfarin 5mg  daily except 6mg  Tuesday, Thursday and Saturdays per recent ambulatory anticoag flow sheet.    Assessment: Pt in ICU now extubated but requiring bipap and potentially may require reintubation.   Was transitioned to lovenox for anticoagulation full  dose.  Now asked to place back on Warfarin  Goal of Therapy:  INR 2-3 when on Coumadin   Plan:  Continue Lovenox 1mg /Kg SQ q12hrs for now Coumadin 5mg  today x 1 Monitor for CBC, signs and symptoms of bleeding.   Hart Robinsons A 03/02/2017,9:54 AM

## 2017-03-03 LAB — CBC WITH DIFFERENTIAL/PLATELET
Basophils Absolute: 0 10*3/uL (ref 0.0–0.1)
Basophils Relative: 0 %
EOS PCT: 0 %
Eosinophils Absolute: 0 10*3/uL (ref 0.0–0.7)
HCT: 41.4 % (ref 36.0–46.0)
Hemoglobin: 12.8 g/dL (ref 12.0–15.0)
LYMPHS ABS: 0.9 10*3/uL (ref 0.7–4.0)
LYMPHS PCT: 6 %
MCH: 28.3 pg (ref 26.0–34.0)
MCHC: 30.9 g/dL (ref 30.0–36.0)
MCV: 91.6 fL (ref 78.0–100.0)
MONOS PCT: 4 %
Monocytes Absolute: 0.6 10*3/uL (ref 0.1–1.0)
Neutro Abs: 14.3 10*3/uL — ABNORMAL HIGH (ref 1.7–7.7)
Neutrophils Relative %: 90 %
Platelets: 242 10*3/uL (ref 150–400)
RBC: 4.52 MIL/uL (ref 3.87–5.11)
RDW: 15.4 % (ref 11.5–15.5)
WBC: 15.8 10*3/uL — AB (ref 4.0–10.5)

## 2017-03-03 LAB — COMPREHENSIVE METABOLIC PANEL
ALBUMIN: 2.5 g/dL — AB (ref 3.5–5.0)
ALT: 105 U/L — ABNORMAL HIGH (ref 14–54)
AST: 42 U/L — AB (ref 15–41)
Alkaline Phosphatase: 57 U/L (ref 38–126)
Anion gap: 8 (ref 5–15)
BILIRUBIN TOTAL: 0.5 mg/dL (ref 0.3–1.2)
BUN: 26 mg/dL — AB (ref 6–20)
CO2: 31 mmol/L (ref 22–32)
Calcium: 8.2 mg/dL — ABNORMAL LOW (ref 8.9–10.3)
Chloride: 100 mmol/L — ABNORMAL LOW (ref 101–111)
Creatinine, Ser: 0.66 mg/dL (ref 0.44–1.00)
GFR calc Af Amer: >60 mL/min (ref >60)
GFR calc non Af Amer: >60 mL/min (ref >60)
GLUCOSE: 126 mg/dL — AB (ref 65–99)
POTASSIUM: 4.5 mmol/L (ref 3.5–5.1)
SODIUM: 139 mmol/L (ref 135–145)
Total Protein: 5.5 g/dL — ABNORMAL LOW (ref 6.5–8.1)

## 2017-03-03 LAB — GLUCOSE, CAPILLARY
GLUCOSE-CAPILLARY: 126 mg/dL — AB (ref 65–99)
GLUCOSE-CAPILLARY: 148 mg/dL — AB (ref 65–99)
Glucose-Capillary: 130 mg/dL — ABNORMAL HIGH (ref 65–99)
Glucose-Capillary: 176 mg/dL — ABNORMAL HIGH (ref 65–99)
Glucose-Capillary: 128 mg/dL — ABNORMAL HIGH (ref 65–99)
Glucose-Capillary: 164 mg/dL — ABNORMAL HIGH (ref 65–99)

## 2017-03-03 LAB — PROTIME-INR
INR: 1.11
Prothrombin Time: 14.2 seconds (ref 11.4–15.2)

## 2017-03-03 MED ORDER — GUAIFENESIN ER 600 MG PO TB12
1200.0000 mg | ORAL_TABLET | Freq: Two times a day (BID) | ORAL | Status: DC
  Administered 2017-03-03 – 2017-03-05 (×5): 1200 mg via ORAL
  Filled 2017-03-03 (×5): qty 2

## 2017-03-03 MED ORDER — IPRATROPIUM-ALBUTEROL 0.5-2.5 (3) MG/3ML IN SOLN
3.0000 mL | Freq: Four times a day (QID) | RESPIRATORY_TRACT | Status: DC
  Administered 2017-03-03 – 2017-03-05 (×8): 3 mL via RESPIRATORY_TRACT
  Filled 2017-03-03 (×8): qty 3

## 2017-03-03 MED ORDER — WARFARIN SODIUM 5 MG PO TABS
5.0000 mg | ORAL_TABLET | Freq: Once | ORAL | Status: AC
  Administered 2017-03-03: 5 mg via ORAL
  Filled 2017-03-03: qty 1

## 2017-03-03 NOTE — Progress Notes (Signed)
[facility] for Warfarin >> LOVENOX >> resume Warfarin 2/22 Indication: Chronic warfarin, Hx PE/DVT/+ factor V Leiden  Allergies  Allergen Reactions  . Dilaudid [Hydromorphone Hcl] Hives and Nausea Only  . Minocycline Hcl     REACTION: Dizzy  . Prednisone     REACTION: feels like throat swelling  . Varenicline Tartrate     REACTION: Dizzy(chantix)   . Zocor [Simvastatin - High Dose] Other (See Comments)    myalgia   Patient Measurements: Height: 5\' 5"  (165.1 cm) Weight: 158 lb 11.7 oz (72 kg) IBW/kg (Calculated) : 57  Vital Signs: Temp: 98 F (36.7 C) (02/23 0800) Temp Source: Oral (02/23 0800) BP: 94/66 (02/23 0900) Pulse Rate: 76 (02/23 0900)  Labs: Recent Labs    03/01/17 0534 03/02/17 0432 03/03/17 0459  HGB 12.6 12.5 12.8  HCT 40.3 40.2 41.4  PLT 247 216 242  LABPROT  --   --  14.2  INR  --   --  1.11  CREATININE 0.72 0.68 0.66   Estimated Creatinine Clearance: 68.8 mL/min (by C-G formula based on SCr of 0.66 mg/dL).  Medical History: Past Medical History:  Diagnosis Date  . Acute MI ([place])    x4, code blue x3  . Arteriosclerotic cardiovascular disease (ASCVD) 2002   Inf STEMI-2002. 2003-cutting balloon + brachytherapy for restenosis; subsequent acute stent thrombosis 06/2010 requiring 2 separate interventions (Zeta stent, then repeat cath with thrombectomy). focal basal inf AK, nl EF; 03/2011: Patent stents, minor nonobst  residual dz, nl EF; neg stress nuclear in 2008 and stress echo in 2009  . Chronic anticoagulation    Warfarin plus ticagrelor  . Chronic respiratory failure ([place]) 08/27/2013   On 2L 02  . COPD (chronic obstructive pulmonary disease) ([place])    02 dependent  . Factor V Leiden, prothrombin gene mutation ([place]) 2006  . Hyperlipidemia   . Hypothyroidism   . Noncompliance   . Pelvic fracture ([place]) 2009  . Pulmonary embolism ([place]) 2006   Associated with deep vein thrombosis-2006; + factor V Leiden  .  Tobacco abuse    50 pack years   Medications:  Medications Prior to Admission  Medication Sig Dispense Refill Last Dose  . albuterol (PROVENTIL) (2.5 MG/3ML) 0.083% nebulizer solution USE 1 VIAL BY NEBULIZER EVERY 4 HOURS AS NEEDED FOR WHEEZING. DX: J44.9 375 vial 0 Past Month at Unknown time  . budesonide (PULMICORT) 0.5 MG/2ML nebulizer solution Take 2 mLs (0.5 mg total) by nebulization 2 (two) times daily. Dx: J43.9 120 mL 3 Past Month at Unknown time  . fentaNYL (DURAGESIC - DOSED MCG/HR) 50 MCG/HR Place 50 mcg onto the skin every 3 (three) days.   Past Month at Unknown time  . fish oil-omega-3 fatty acids 1000 MG capsule Take 2 g by mouth daily.     Past Month at Unknown time  . ipratropium-albuterol (DUONEB) 0.5-2.5 (3) MG/3ML SOLN 1 vial via nebulizer at breakfast, lunch, dinner and bedtime daily. May take 2 additional treatments on bad day Dx: J43.9 360 mL 3 Past Month at Unknown time  . levothyroxine (SYNTHROID, LEVOTHROID) 125 MCG tablet take 1 tablet by mouth once daily 90 tablet 2 Past Month at Unknown time  . OXYGEN Inhale 2 L into the lungs continuous.   Past Month at Unknown time  . warfarin (COUMADIN) 1 MG tablet Take 1 tablet (1mg ) on Tuesdays and Saturdays along with a 5mg  tablet (Patient taking differently: Take 1 mg by mouth 2 (two) times  a week. Take 1 tablet (1mg ) on Tuesdays and Fridays along with a 5mg  tablet) 30 tablet 4 Past Month at Unknown time  . warfarin (COUMADIN) 5 MG tablet Take 1 tablet (5 mg total) by mouth daily. (Patient taking differently: Take 5-6 mg by mouth daily. 6mg  on Saturdays, 5mg  on Sunday, Monday, 6mg  on Tuesdays, 5mg  on Wednesdays, 6mg  on Thursdays, and 5mg  on Fridays.) 90 tablet 3 Past Month at Unknown time  . nitroGLYCERIN (NITROSTAT) 0.4 MG SL tablet Place 1 tablet (0.4 mg total) under the tongue every 5 (five) minutes as needed for chest pain. 25 tablet 3 unknown  . Respiratory Therapy Supplies (FLUTTER) DEVI Use as directed 1 each 0 Taking     Home warfarin 5mg  daily except 6mg  Tuesday, Thursday and Saturdays per recent ambulatory anticoag flow sheet.    Assessment: Pt in ICU now extubated but requiring bipap and potentially may require reintubation.   Was transitioned to lovenox for anticoagulation full dose.  Now asked to place back on Warfarin, INR remains low.  Goal of Therapy:  INR 2-3 when on Coumadin   Plan:  Continue Lovenox 1mg /Kg SQ q12hrs until INR at goal Coumadin 5mg  today x 1 Monitor for CBC, signs and symptoms of bleeding.   Hart Robinsons A 03/03/2017,9:32 AM

## 2017-03-03 NOTE — Progress Notes (Signed)
  Speech Language Pathology Treatment: Dysphagia  Patient Details Name: [name] MRN: [id number] DOB: 1950-06-18 Today's Date: 03/03/2017 Time: 8828-0034 SLP Time Calculation (min) (ACUTE ONLY): 19 min  Assessment / Plan / Recommendation Clinical Impression  Pt seen for diet tolerance and ongoing dysphagia intervention following MBSS completed yesterday. Pt required cues to reposition to upright in bed to increase safety of po intake with lunch tray. Pt consumed mechanical soft textures and Sprite with one episode of coughing. Her daughter and granddaughter arrived and Pt cued to refrain from talking during meals due to respiratory compromise. Her daughter confirmed that the Pt's animals were "just fine", which relieved Pt. SLP advised that Pt would likely be evaluated by PT when MD feels it is appropriate to determine if Pt needed PT post discharge due to prolonged hospitalization. Continue diet as ordered and SLP will continue to follow.    HPI HPI: [name]  is a 67 y.o. female, w hx of CAD, Hypothyroidism, PE, Factor V leiden, Copd on home o2, apparently c/o increase in dyspnea for 24 hours prior to admission.  Slight cough, dry-yellow sputum.  Denies fever, chills, cp, palp, n/v, diarrhea, brbpr, black stool.  Pt presents to ED due to increase in dyspnea.      SLP Plan  Continue with current plan of care       Recommendations  Diet recommendations: Dysphagia 3 (mechanical soft);Thin liquid Liquids provided via: Cup;Straw Medication Administration: Crushed with puree Supervision: Patient able to self feed;Intermittent supervision to cue for compensatory strategies Compensations: Slow rate;Small sips/bites;Multiple dry swallows after each bite/sip Postural Changes and/or Swallow Maneuvers: Seated upright 90 degrees;Upright 30-60 min after meal                General recommendations: PT consult Oral Care Recommendations: Oral care BID;Staff/trained caregiver to  provide oral care Follow up Recommendations: Skilled Nursing facility SLP Visit Diagnosis: Dysphagia, oropharyngeal phase (R13.12) Plan: Continue with current plan of care       Thank you,  [name], [place]                 [place] 03/03/2017, 2:07 PM

## 2017-03-03 NOTE — Progress Notes (Signed)
Wasted 210cc of fentanyl in sink. Witnessed by [employer]

## 2017-03-03 NOTE — Progress Notes (Signed)
Subjective: She is much more awake and alert today.  She did not use BiPAP last night.  She remains on nasal oxygen.  Objective: Vital signs in last 24 hours: Temp:  [97.9 F (36.6 C)-99.2 F (37.3 C)] 98 F (36.7 C) (02/23 0800) Pulse Rate:  [67-99] 76 (02/23 0900) Resp:  [16-30] 21 (02/23 0600) BP: (94-154)/(49-102) 94/66 (02/23 0900) SpO2:  [93 %-100 %] 98 % (02/23 0900) Weight:  [72 kg (158 lb 11.7 oz)] 72 kg (158 lb 11.7 oz) (02/23 0400) Weight change: 0.2 kg (7.1 oz) Last BM Date: 03/01/17  Intake/Output from previous day: 02/22 0701 - 02/23 0700 In: 240 [P.O.:240] Out: 2800 [Urine:2800]  PHYSICAL EXAM General appearance: alert, cooperative and no distress Resp: rhonchi bilaterally Cardio: regular rate and rhythm, S1, S2 normal, no murmur, click, rub or gallop GI: soft, non-tender; bowel sounds normal; no masses,  no organomegaly Extremities: extremities normal, atraumatic, no cyanosis or edema  Lab Results:  Results for orders placed or performed during the hospital encounter of 02/19/17 (from the past 48 hour(s))  Glucose, capillary     Status: Abnormal   Collection Time: 03/01/17 11:14 AM  Result Value Ref Range   Glucose-Capillary 110 (H) 65 - 99 mg/dL  Glucose, capillary     Status: None   Collection Time: 03/01/17  4:18 PM  Result Value Ref Range   Glucose-Capillary 92 65 - 99 mg/dL  Glucose, capillary     Status: Abnormal   Collection Time: 03/01/17  7:39 PM  Result Value Ref Range   Glucose-Capillary 152 (H) 65 - 99 mg/dL   Comment 1 Notify RN   Glucose, capillary     Status: Abnormal   Collection Time: 03/01/17 11:47 PM  Result Value Ref Range   Glucose-Capillary 120 (H) 65 - 99 mg/dL   Comment 1 Notify RN   Glucose, capillary     Status: Abnormal   Collection Time: 03/02/17  4:01 AM  Result Value Ref Range   Glucose-Capillary 104 (H) 65 - 99 mg/dL   Comment 1 Notify RN   CBC with Differential/Platelet     Status: None   Collection Time: 03/02/17   4:32 AM  Result Value Ref Range   WBC 8.6 4.0 - 10.5 K/uL   RBC 4.45 3.87 - 5.11 MIL/uL   Hemoglobin 12.5 12.0 - 15.0 g/dL   HCT 40.2 36.0 - 46.0 %   MCV 90.3 78.0 - 100.0 fL   MCH 28.1 26.0 - 34.0 pg   MCHC 31.1 30.0 - 36.0 g/dL   RDW 15.4 11.5 - 15.5 %   Platelets 216 150 - 400 K/uL   Neutrophils Relative % 84 %   Neutro Abs 7.2 1.7 - 7.7 K/uL   Lymphocytes Relative 8 %   Lymphs Abs 0.7 0.7 - 4.0 K/uL   Monocytes Relative 8 %   Monocytes Absolute 0.7 0.1 - 1.0 K/uL   Eosinophils Relative 0 %   Eosinophils Absolute 0.0 0.0 - 0.7 K/uL   Basophils Relative 0 %   Basophils Absolute 0.0 0.0 - 0.1 K/uL    Comment: Performed at [facility], [address] [place], [place] [address]  Comprehensive metabolic panel     Status: Abnormal   Collection Time: 03/02/17  4:32 AM  Result Value Ref Range   Sodium 142 135 - 145 mmol/L   Potassium 4.3 3.5 - 5.1 mmol/L   Chloride 102 101 - 111 mmol/L   CO2 31 22 - 32 mmol/L   Glucose,  Bld 121 (H) 65 - 99 mg/dL   BUN 29 (H) 6 - 20 mg/dL   Creatinine, Ser 0.68 0.44 - 1.00 mg/dL   Calcium 8.2 (L) 8.9 - 10.3 mg/dL   Total Protein 5.7 (L) 6.5 - 8.1 g/dL   Albumin 2.6 (L) 3.5 - 5.0 g/dL   AST 50 (H) 15 - 41 U/L   ALT 104 (H) 14 - 54 U/L   Alkaline Phosphatase 58 38 - 126 U/L   Total Bilirubin 1.0 0.3 - 1.2 mg/dL   GFR calc non Af Amer >60 >60 mL/min   GFR calc Af Amer >60 >60 mL/min    Comment: (NOTE) The eGFR has been calculated using the CKD EPI equation. This calculation has not been validated in all clinical situations. eGFR's persistently <60 mL/min signify possible Chronic Kidney Disease.    Anion gap 9 5 - 15    Comment: Performed at [facility], [address], [place], [place] [address]  Glucose, capillary     Status: Abnormal   Collection Time: 03/02/17  7:44 AM  Result Value Ref Range   Glucose-Capillary 105 (H) 65 - 99 mg/dL   Comment 1 Notify RN    Comment 2 Document in Chart   Glucose, capillary     Status: Abnormal    Collection Time: 03/02/17 12:14 PM  Result Value Ref Range   Glucose-Capillary 130 (H) 65 - 99 mg/dL   Comment 1 Notify RN    Comment 2 Document in Chart   Glucose, capillary     Status: Abnormal   Collection Time: 03/02/17  4:06 PM  Result Value Ref Range   Glucose-Capillary 157 (H) 65 - 99 mg/dL   Comment 1 Notify RN    Comment 2 Document in Chart   Glucose, capillary     Status: Abnormal   Collection Time: 03/02/17  8:24 PM  Result Value Ref Range   Glucose-Capillary 171 (H) 65 - 99 mg/dL   Comment 1 Notify RN    Comment 2 Document in Chart   Glucose, capillary     Status: Abnormal   Collection Time: 03/02/17 11:57 PM  Result Value Ref Range   Glucose-Capillary 148 (H) 65 - 99 mg/dL   Comment 1 Notify RN    Comment 2 Document in Chart   CBC with Differential/Platelet     Status: Abnormal   Collection Time: 03/03/17  4:59 AM  Result Value Ref Range   WBC 15.8 (H) 4.0 - 10.5 K/uL   RBC 4.52 3.87 - 5.11 MIL/uL   Hemoglobin 12.8 12.0 - 15.0 g/dL   HCT 41.4 36.0 - 46.0 %   MCV 91.6 78.0 - 100.0 fL   MCH 28.3 26.0 - 34.0 pg   MCHC 30.9 30.0 - 36.0 g/dL   RDW 15.4 11.5 - 15.5 %   Platelets 242 150 - 400 K/uL   Neutrophils Relative % 90 %   Neutro Abs 14.3 (H) 1.7 - 7.7 K/uL   Lymphocytes Relative 6 %   Lymphs Abs 0.9 0.7 - 4.0 K/uL   Monocytes Relative 4 %   Monocytes Absolute 0.6 0.1 - 1.0 K/uL   Eosinophils Relative 0 %   Eosinophils Absolute 0.0 0.0 - 0.7 K/uL   Basophils Relative 0 %   Basophils Absolute 0.0 0.0 - 0.1 K/uL    Comment: Performed at [facility], [address], [place], [place] [address]  Comprehensive metabolic panel     Status: Abnormal   Collection Time: 03/03/17  4:59  AM  Result Value Ref Range   Sodium 139 135 - 145 mmol/L   Potassium 4.5 3.5 - 5.1 mmol/L   Chloride 100 (L) 101 - 111 mmol/L   CO2 31 22 - 32 mmol/L   Glucose, Bld 126 (H) 65 - 99 mg/dL   BUN 26 (H) 6 - 20 mg/dL   Creatinine, Ser 0.66 0.44 - 1.00 mg/dL   Calcium 8.2 (L) 8.9 -  10.3 mg/dL   Total Protein 5.5 (L) 6.5 - 8.1 g/dL   Albumin 2.5 (L) 3.5 - 5.0 g/dL   AST 42 (H) 15 - 41 U/L   ALT 105 (H) 14 - 54 U/L   Alkaline Phosphatase 57 38 - 126 U/L   Total Bilirubin 0.5 0.3 - 1.2 mg/dL   GFR calc non Af Amer >60 >60 mL/min   GFR calc Af Amer >60 >60 mL/min    Comment: (NOTE) The eGFR has been calculated using the CKD EPI equation. This calculation has not been validated in all clinical situations. eGFR's persistently <60 mL/min signify possible Chronic Kidney Disease.    Anion gap 8 5 - 15    Comment: Performed at [facility], [address] [place], [place] [address]  Protime-INR     Status: None   Collection Time: 03/03/17  4:59 AM  Result Value Ref Range   Prothrombin Time 14.2 11.4 - 15.2 seconds   INR 1.11     Comment: Performed at [facility], [address] [place], [place] [address]  Glucose, capillary     Status: Abnormal   Collection Time: 03/03/17  5:25 AM  Result Value Ref Range   Glucose-Capillary 126 (H) 65 - 99 mg/dL   Comment 1 Notify RN    Comment 2 Document in Chart   Glucose, capillary     Status: Abnormal   Collection Time: 03/03/17  8:04 AM  Result Value Ref Range   Glucose-Capillary 130 (H) 65 - 99 mg/dL    ABGS No results for input(s): PHART, PO2ART, TCO2, HCO3 in the last 72 hours.  Invalid input(s): PCO2 CULTURES No results found for this or any previous visit (from the past 240 hour(s)). Studies/Results: Dg Swallowing Func-speech Pathology  Result Date: 03/02/2017 Objective Swallowing Evaluation: Type of Study: MBS-Modified Barium Swallow Study  Patient Details Name: [name] MRN: [id number] Date of Birth: [birth year] Today's Date: 03/02/2017 Time: SLP Start Time (ACUTE ONLY): 1035 -SLP Stop Time (ACUTE ONLY): 1100 SLP Time Calculation (min) (ACUTE ONLY): 25 min Past Medical History: Past Medical History: Diagnosis Date . Acute MI ([place])   x4, code blue x3 . Arteriosclerotic cardiovascular disease (ASCVD) 2002  Inf  STEMI-2002. 2003-cutting balloon + brachytherapy for restenosis; subsequent acute stent thrombosis 06/2010 requiring 2 separate interventions (Zeta stent, then repeat cath with thrombectomy). focal basal inf AK, nl EF; 03/2011: Patent stents, minor nonobst  residual dz, nl EF; neg stress nuclear in 2008 and stress echo in 2009 . Chronic anticoagulation   Warfarin plus ticagrelor . Chronic respiratory failure ([place]) 08/27/2013  On 2L 02 . COPD (chronic obstructive pulmonary disease) ([place])   02 dependent . Factor V Leiden, prothrombin gene mutation ([place]) 2006 . Hyperlipidemia  . Hypothyroidism  . Noncompliance  . Pelvic fracture ([place]) 2009 . Pulmonary embolism ([place]) 2006  Associated with deep vein thrombosis-2006; + factor V Leiden . Tobacco abuse   50 pack years Past Surgical History: Past Surgical History: Procedure Laterality Date . COLONOSCOPY  Approximately 2000  Negative screening study . CORONARY ANGIOPLASTY  2002, 2003, 2012 . LEFT AND RIGHT HEART CATHETERIZATION WITH CORONARY ANGIOGRAM N/A 04/03/2011  Procedure: LEFT AND RIGHT HEART CATHETERIZATION WITH CORONARY ANGIOGRAM;  Surgeon: Sherren Mocha, MD;  Location: [facility] CATH LAB;  Service: Cardiovascular;  Laterality: N/A; HPI: [name]  is a 67 y.o. female, w hx of CAD, Hypothyroidism, PE, Factor V leiden, Copd on home o2, apparently c/o increase in dyspnea for 24 hours prior to admission.  Slight cough, dry-yellow sputum.  Denies fever, chills, cp, palp, n/v, diarrhea, brbpr, black stool.  Pt presents to ED due to increase in dyspnea.  Subjective: "I want a Sprite." Assessment / Plan / Recommendation CHL IP CLINICAL IMPRESSIONS 03/02/2017 Clinical Impression Pt presents with min oropharyngeal phase dysphagia characterized by weak lingual manipulation of solids and impaired mastication due to edentulous status resulting in piecemeal deglutition and delayed oral prep with solids, min delay in swallow initiation with swallow trigger after filling the valleculae  and spilling to the pyriforms with cup/straw sips thin resulting in variable flash penetration with thins without aspiration. Pt with spontaneous cough with one episode of deeper penetration and it was immediately removed (this also occurred when taking barium tablet). Pt noted to cough at bedside during po trials and suspect this due to element of possible penetration and/or respiratory compromise (COPD). Recommend D3/mech soft with thin liquids with standard aspiration and reflux precautions; po only when Pt is alert and upright. SLP will continue to follow. Recommend PT evaluation to help determine needs post acute. SLP Visit Diagnosis Dysphagia, oropharyngeal phase (R13.12) Attention and concentration deficit following -- Frontal lobe and executive function deficit following -- Impact on safety and function Mild aspiration risk   CHL IP TREATMENT RECOMMENDATION 03/02/2017 Treatment Recommendations Therapy as outlined in treatment plan below   Prognosis 03/02/2017 Prognosis for Safe Diet Advancement Good Barriers to Reach Goals -- Barriers/Prognosis Comment -- CHL IP DIET RECOMMENDATION 03/02/2017 SLP Diet Recommendations Dysphagia 3 (Mech soft) solids;Thin liquid Liquid Administration via Cup;Straw Medication Administration Whole meds with liquid Compensations Slow rate;Small sips/bites;Multiple dry swallows after each bite/sip Postural Changes Remain semi-upright after after feeds/meals (Comment);Seated upright at 90 degrees   CHL IP OTHER RECOMMENDATIONS 03/02/2017 Recommended Consults -- Oral Care Recommendations Oral care BID;Staff/trained caregiver to provide oral care Other Recommendations Clarify dietary restrictions   CHL IP FOLLOW UP RECOMMENDATIONS 03/02/2017 Follow up Recommendations Skilled Nursing facility   [facility] IP FREQUENCY AND DURATION 03/02/2017 Speech Therapy Frequency (ACUTE ONLY) min 2x/week Treatment Duration 1 week      CHL IP ORAL PHASE 03/02/2017 Oral Phase Impaired Oral - Pudding Teaspoon --  Oral - Pudding Cup -- Oral - Honey Teaspoon -- Oral - Honey Cup -- Oral - Nectar Teaspoon -- Oral - Nectar Cup -- Oral - Nectar Straw -- Oral - Thin Teaspoon -- Oral - Thin Cup -- Oral - Thin Straw -- Oral - Puree -- Oral - Mech Soft Impaired mastication;Lingual/palatal residue;Piecemeal swallowing;Delayed oral transit;Decreased bolus cohesion Oral - Regular -- Oral - Multi-Consistency -- Oral - Pill -- Oral Phase - Comment Pt edentulous  CHL IP PHARYNGEAL PHASE 03/02/2017 Pharyngeal Phase Impaired Pharyngeal- Pudding Teaspoon -- Pharyngeal -- Pharyngeal- Pudding Cup -- Pharyngeal -- Pharyngeal- Honey Teaspoon -- Pharyngeal -- Pharyngeal- Honey Cup -- Pharyngeal -- Pharyngeal- Nectar Teaspoon -- Pharyngeal -- Pharyngeal- Nectar Cup -- Pharyngeal -- Pharyngeal- Nectar Straw -- Pharyngeal -- Pharyngeal- Thin Teaspoon Delayed swallow initiation-vallecula Pharyngeal -- Pharyngeal- Thin Cup Delayed swallow initiation-vallecula;Delayed swallow initiation-pyriform sinuses;Penetration/Aspiration during swallow;Reduced epiglottic inversion Pharyngeal Material does not enter airway;Material  enters airway, remains ABOVE vocal cords then ejected out Pharyngeal- Thin Straw Delayed swallow initiation-pyriform sinuses;Reduced epiglottic inversion;Penetration/Aspiration during swallow Pharyngeal Material does not enter airway;Material enters airway, remains ABOVE vocal cords then ejected out Pharyngeal- Puree WFL;Delayed swallow initiation-vallecula Pharyngeal -- Pharyngeal- Mechanical Soft WFL;Delayed swallow initiation-vallecula;Pharyngeal residue - valleculae Pharyngeal -- Pharyngeal- Regular -- Pharyngeal -- Pharyngeal- Multi-consistency -- Pharyngeal -- Pharyngeal- Pill Penetration/Aspiration during swallow Pharyngeal Material enters airway, remains ABOVE vocal cords then ejected out Pharyngeal Comment (No Data)  CHL IP CERVICAL ESOPHAGEAL PHASE 03/02/2017 Cervical Esophageal Phase WFL Pudding Teaspoon -- Pudding Cup -- Honey  Teaspoon -- Honey Cup -- Nectar Teaspoon -- Nectar Cup -- Nectar Straw -- Thin Teaspoon -- Thin Cup -- Thin Straw -- Puree -- Mechanical Soft -- Regular -- Multi-consistency -- Pill -- Cervical Esophageal Comment -- No flowsheet data found. Thank you, Genene Churn, [place] 03/02/2017, 2:48 PM               Medications:  Prior to Admission:  Medications Prior to Admission  Medication Sig Dispense Refill Last Dose  . albuterol (PROVENTIL) (2.5 MG/3ML) 0.083% nebulizer solution USE 1 VIAL BY NEBULIZER EVERY 4 HOURS AS NEEDED FOR WHEEZING. DX: J44.9 375 vial 0 Past Month at Unknown time  . budesonide (PULMICORT) 0.5 MG/2ML nebulizer solution Take 2 mLs (0.5 mg total) by nebulization 2 (two) times daily. Dx: J43.9 120 mL 3 Past Month at Unknown time  . fentaNYL (DURAGESIC - DOSED MCG/HR) 50 MCG/HR Place 50 mcg onto the skin every 3 (three) days.   Past Month at Unknown time  . fish oil-omega-3 fatty acids 1000 MG capsule Take 2 g by mouth daily.     Past Month at Unknown time  . ipratropium-albuterol (DUONEB) 0.5-2.5 (3) MG/3ML SOLN 1 vial via nebulizer at breakfast, lunch, dinner and bedtime daily. May take 2 additional treatments on bad day Dx: J43.9 360 mL 3 Past Month at Unknown time  . levothyroxine (SYNTHROID, LEVOTHROID) 125 MCG tablet take 1 tablet by mouth once daily 90 tablet 2 Past Month at Unknown time  . OXYGEN Inhale 2 L into the lungs continuous.   Past Month at Unknown time  . warfarin (COUMADIN) 1 MG tablet Take 1 tablet ('1mg'$ ) on Tuesdays and Saturdays along with a '5mg'$  tablet (Patient taking differently: Take 1 mg by mouth 2 (two) times a week. Take 1 tablet ('1mg'$ ) on Tuesdays and Fridays along with a '5mg'$  tablet) 30 tablet 4 Past Month at Unknown time  . warfarin (COUMADIN) 5 MG tablet Take 1 tablet (5 mg total) by mouth daily. (Patient taking differently: Take 5-6 mg by mouth daily. '6mg'$  on Saturdays, '5mg'$  on Sunday, Monday, '6mg'$  on Tuesdays, '5mg'$  on Wednesdays, '6mg'$   on Thursdays, and '5mg'$  on Fridays.) 90 tablet 3 Past Month at Unknown time  . nitroGLYCERIN (NITROSTAT) 0.4 MG SL tablet Place 1 tablet (0.4 mg total) under the tongue every 5 (five) minutes as needed for chest pain. 25 tablet 3 unknown  . Respiratory Therapy Supplies (FLUTTER) DEVI Use as directed 1 each 0 Taking   Scheduled: . chlorhexidine  15 mL Mouth Rinse BID  . enoxaparin (LOVENOX) injection  80 mg Subcutaneous Q12H  . famotidine  20 mg Oral Daily  . ipratropium-albuterol  3 mL Nebulization Q6H WA  . levothyroxine  125 mcg Oral QAC breakfast  . mouth rinse  15 mL Mouth Rinse q12n4p  . methylPREDNISolone (SOLU-MEDROL) injection  60 mg Intravenous Q8H  . ticagrelor  60 mg Oral BID  .  Warfarin - Pharmacist Dosing Inpatient   Does not apply Q24H   Continuous: . sodium chloride     PNT:IRWERX chloride, albuterol, LORazepam, metoprolol tartrate  Assesment: She was admitted with influenza A and COPD exacerbation with acute on chronic hypoxic and hypercapnic respiratory failure.  She required intubation and mechanical ventilation.  She is been off the ventilator now about 4 days.  She was encephalopathic when she came off the ventilator but is better with that now.  Reviewed pulmonary notes and she is apparently DNR although we did not know that when she came into the hospital.  She has severe COPD at baseline. Principal Problem:   COPD exacerbation ([place]) Active Problems:   Hypothyroidism   Factor V Leiden, prothrombin gene mutation ([place])   Tachycardia   Influenza A   Acute respiratory failure ([place])    Plan: Continue current treatments    LOS: 12 days   [place] Depascale L 03/03/2017, 9:24 AM

## 2017-03-03 NOTE — Progress Notes (Addendum)
PROGRESS NOTE    [name]  YQM:[id number] DOB: Aug 17, 1950 DOA: 02/19/2017 PCP: [name] Peng, NP    Brief Narrative:  [name] y.o.female,w hx of CAD, Hypothyroidism, PE, Factor V leiden, Copd on home o2, apparently c/o increase in dyspnea for 24 hours prior to admission. Slight cough, dry-yellow sputum. Denies fever, chills, cp, palp, n/v, diarrhea, brbpr, black stool. Pt presents to ED due to increase in dyspnea.   CXR demonstrated acute on chronic pulmonary interstitial opacity; CTA neg for PE. Positive influenza A  Assessment & Plan:   Principal Problem:   COPD exacerbation ([place]) Active Problems:   Hypothyroidism   Factor V Leiden, prothrombin gene mutation ([place])   Tachycardia   Influenza A   Acute respiratory failure ([place])   1. Acute on chronic respiratory failure with hypoxia and hypercapnia.  Related to influenza infection as well as COPD exacerbation.  Briefly required intubation, currently intermittently requiring BiPAP.  CT chest was negative for pulmonary embolus.  She has intermittently required BiPAP, but did not wear it last night.  Appears to be doing better today.   2. Influenza A.  Treated with Tamiflu 3. COPD exacerbation.  Currently on intravenous steroids and bronchodilators.  Pulmonology following.  Progress has been slow, but she is slowly improving.  Continue current treatments 4. 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  On lifelong anticoagulation.  Currently on Coumadin with Lovenox bridge. 5. Hypothyroidism.  Continue on Synthroid. 6. Dysphagia.  Being followed by speech therapy for swallow evaluation.  Patient underwent modified barium swallow and recommendations are for dysphagia 3 diet. 7. Coronary artery disease.  No complaints of chest pain.  Continue on Brilinta.   DVT prophylaxis:  Lovenox bridge, Coumadin Code Status: DNR.  Confirmed by patient. Family Communication: No family present Disposition Plan: May need  placement.  Will request physical therapy evaluation.   Consultants:   Pulmonology  Procedures:     Antimicrobials:      Subjective: Shortness of breath improving.  Did not wear BiPAP last night.  Continues to have productive cough.  Objective: Vitals:   03/03/17 1200 03/03/17 1434 03/03/17 1500 03/03/17 1600  BP: (!) 104/58  (!) 123/55 108/74  Pulse: 95  85 86  Resp: 11  20 (!) 26  Temp: 98.3 F (36.8 C)     TempSrc: Oral     SpO2: 100% 96% 97% 96%  Weight:      Height:        Intake/Output Summary (Last 24 hours) at 03/03/2017 1648 Last data filed at 03/03/2017 1000 Gross per 24 hour  Intake -  Output 2400 ml  Net -2400 ml   Filed Weights   03/01/17 0419 03/02/17 0500 03/03/17 0400  Weight: 74.1 kg (163 lb 5.8 oz) 71.8 kg (158 lb 4.6 oz) 72 kg (158 lb 11.7 oz)    Examination:  General exam: Alert, awake, oriented x 3 Respiratory system: Diminished breath sounds bilaterally. Respiratory effort normal. Cardiovascular system:RRR. No murmurs, rubs, gallops. Gastrointestinal system: Abdomen is nondistended, soft and nontender. No organomegaly or masses felt. Normal bowel sounds heard. Central nervous system: Alert and oriented. No focal neurological deficits. Extremities: No C/C/E, +pedal pulses Skin: No rashes, lesions or ulcers Psychiatry: Judgement and insight appear normal. Mood & affect appropriate.   Data Reviewed: I have personally reviewed following labs and imaging studies  CBC: Recent Labs  Lab 02/27/17 0416 02/28/17 0626 03/01/17 0534 03/02/17 0432 03/03/17 0459  WBC 12.7* 8.0 13.3* 8.6 15.8*  NEUTROABS 10.8* 6.4  10.8* 7.2 14.3*  HGB 12.0 11.9* 12.6 12.5 12.8  HCT 38.6 37.6 40.3 40.2 41.4  MCV 92.1 90.8 90.4 90.3 91.6  PLT 214 191 247 216 902   Basic Metabolic Panel: Recent Labs  Lab 02/27/17 0416 02/28/17 0626 03/01/17 0534 03/02/17 0432 03/03/17 0459  NA 143 139 141 142 139  K 3.7 4.3 3.9 4.3 4.5  CL 96* 97* 99* 102 100*  CO2  36* 34* 32 31 31  GLUCOSE 116* 117* 95 121* 126*  BUN 30* 28* 30* 29* 26*  CREATININE 0.81 0.74 0.72 0.68 0.66  CALCIUM 8.3* 8.3* 8.5* 8.2* 8.2*   GFR: Estimated Creatinine Clearance: 68.8 mL/min (by C-G formula based on SCr of 0.66 mg/dL). Liver Function Tests: Recent Labs  Lab 02/27/17 0416 02/28/17 0626 03/01/17 0534 03/02/17 0432 03/03/17 0459  AST 80* 44* 35 50* 42*  ALT 178* 123* 105* 104* 105*  ALKPHOS 69 56 60 58 57  BILITOT 1.0 1.0 0.9 1.0 0.5  PROT 6.1* 5.5* 6.2* 5.7* 5.5*  ALBUMIN 2.7* 2.5* 2.8* 2.6* 2.5*   No results for input(s): LIPASE, AMYLASE in the last 168 hours. No results for input(s): AMMONIA in the last 168 hours. Coagulation Profile: Recent Labs  Lab 03/03/17 0459  INR 1.11   Cardiac Enzymes: No results for input(s): CKTOTAL, CKMB, CKMBINDEX, TROPONINI in the last 168 hours. BNP (last 3 results) No results for input(s): PROBNP in the last 8760 hours. HbA1C: No results for input(s): HGBA1C in the last 72 hours. CBG: Recent Labs  Lab 03/02/17 2024 03/02/17 2357 03/03/17 0525 03/03/17 0804 03/03/17 1120  GLUCAP 171* 148* 126* 130* 176*   Lipid Profile: No results for input(s): CHOL, HDL, LDLCALC, TRIG, CHOLHDL, LDLDIRECT in the last 72 hours. Thyroid Function Tests: No results for input(s): TSH, T4TOTAL, FREET4, T3FREE, THYROIDAB in the last 72 hours. Anemia Panel: No results for input(s): VITAMINB12, FOLATE, FERRITIN, TIBC, IRON, RETICCTPCT in the last 72 hours. Sepsis Labs: No results for input(s): PROCALCITON, LATICACIDVEN in the last 168 hours.  No results found for this or any previous visit (from the past 240 hour(s)).       Radiology Studies: Dg Swallowing Func-speech Pathology  Result Date: 03/02/2017 Objective Swallowing Evaluation: Type of Study: MBS-Modified Barium Swallow Study  Patient Details Name: [name] MRN: [id number] Date of Birth: 1950/05/17 Today's Date: 03/02/2017 Time: SLP Start Time (ACUTE ONLY): 1035  -SLP Stop Time (ACUTE ONLY): 1100 SLP Time Calculation (min) (ACUTE ONLY): 25 min Past Medical History: Past Medical History: Diagnosis Date . Acute MI ([place])   x4, code blue x3 . Arteriosclerotic cardiovascular disease (ASCVD) 2002  Inf STEMI-2002. 2003-cutting balloon + brachytherapy for restenosis; subsequent acute stent thrombosis 06/2010 requiring 2 separate interventions (Zeta stent, then repeat cath with thrombectomy). focal basal inf AK, nl EF; 03/2011: Patent stents, minor nonobst  residual dz, nl EF; neg stress nuclear in 2008 and stress echo in 2009 . Chronic anticoagulation   Warfarin plus ticagrelor . Chronic respiratory failure ([place]) 08/27/2013  On 2L 02 . COPD (chronic obstructive pulmonary disease) ([place])   02 dependent . Factor V Leiden, prothrombin gene mutation ([place]) 2006 . Hyperlipidemia  . Hypothyroidism  . Noncompliance  . Pelvic fracture ([place]) 2009 . Pulmonary embolism ([place]) 2006  Associated with deep vein thrombosis-2006; + factor V Leiden . Tobacco abuse   50 pack years Past Surgical History: Past Surgical History: Procedure Laterality Date . COLONOSCOPY  Approximately 2000  Negative screening study . CORONARY ANGIOPLASTY  2002, 2003, 2012 .  LEFT AND RIGHT HEART CATHETERIZATION WITH CORONARY ANGIOGRAM N/A 04/03/2011  Procedure: LEFT AND RIGHT HEART CATHETERIZATION WITH CORONARY ANGIOGRAM;  Surgeon: Sherren Mocha, MD;  Location: [facility] CATH LAB;  Service: Cardiovascular;  Laterality: N/A; HPI: Aleyda Gindlesperger  is a 67 y.o. female, w hx of CAD, Hypothyroidism, PE, Factor V leiden, Copd on home o2, apparently c/o increase in dyspnea for 24 hours prior to admission.  Slight cough, dry-yellow sputum.  Denies fever, chills, cp, palp, n/v, diarrhea, brbpr, black stool.  Pt presents to ED due to increase in dyspnea.  Subjective: "I want a Sprite." Assessment / Plan / Recommendation CHL IP CLINICAL IMPRESSIONS 03/02/2017 Clinical Impression Pt presents with min oropharyngeal phase dysphagia characterized by  weak lingual manipulation of solids and impaired mastication due to edentulous status resulting in piecemeal deglutition and delayed oral prep with solids, min delay in swallow initiation with swallow trigger after filling the valleculae and spilling to the pyriforms with cup/straw sips thin resulting in variable flash penetration with thins without aspiration. Pt with spontaneous cough with one episode of deeper penetration and it was immediately removed (this also occurred when taking barium tablet). Pt noted to cough at bedside during po trials and suspect this due to element of possible penetration and/or respiratory compromise (COPD). Recommend D3/mech soft with thin liquids with standard aspiration and reflux precautions; po only when Pt is alert and upright. SLP will continue to follow. Recommend PT evaluation to help determine needs post acute. SLP Visit Diagnosis Dysphagia, oropharyngeal phase (R13.12) Attention and concentration deficit following -- Frontal lobe and executive function deficit following -- Impact on safety and function Mild aspiration risk   CHL IP TREATMENT RECOMMENDATION 03/02/2017 Treatment Recommendations Therapy as outlined in treatment plan below   Prognosis 03/02/2017 Prognosis for Safe Diet Advancement Good Barriers to Reach Goals -- Barriers/Prognosis Comment -- CHL IP DIET RECOMMENDATION 03/02/2017 SLP Diet Recommendations Dysphagia 3 (Mech soft) solids;Thin liquid Liquid Administration via Cup;Straw Medication Administration Whole meds with liquid Compensations Slow rate;Small sips/bites;Multiple dry swallows after each bite/sip Postural Changes Remain semi-upright after after feeds/meals (Comment);Seated upright at 90 degrees   CHL IP OTHER RECOMMENDATIONS 03/02/2017 Recommended Consults -- Oral Care Recommendations Oral care BID;Staff/trained caregiver to provide oral care Other Recommendations Clarify dietary restrictions   CHL IP FOLLOW UP RECOMMENDATIONS 03/02/2017 Follow up  Recommendations Skilled Nursing facility   [facility] IP FREQUENCY AND DURATION 03/02/2017 Speech Therapy Frequency (ACUTE ONLY) min 2x/week Treatment Duration 1 week      CHL IP ORAL PHASE 03/02/2017 Oral Phase Impaired Oral - Pudding Teaspoon -- Oral - Pudding Cup -- Oral - Honey Teaspoon -- Oral - Honey Cup -- Oral - Nectar Teaspoon -- Oral - Nectar Cup -- Oral - Nectar Straw -- Oral - Thin Teaspoon -- Oral - Thin Cup -- Oral - Thin Straw -- Oral - Puree -- Oral - Mech Soft Impaired mastication;Lingual/palatal residue;Piecemeal swallowing;Delayed oral transit;Decreased bolus cohesion Oral - Regular -- Oral - Multi-Consistency -- Oral - Pill -- Oral Phase - Comment Pt edentulous  CHL IP PHARYNGEAL PHASE 03/02/2017 Pharyngeal Phase Impaired Pharyngeal- Pudding Teaspoon -- Pharyngeal -- Pharyngeal- Pudding Cup -- Pharyngeal -- Pharyngeal- Honey Teaspoon -- Pharyngeal -- Pharyngeal- Honey Cup -- Pharyngeal -- Pharyngeal- Nectar Teaspoon -- Pharyngeal -- Pharyngeal- Nectar Cup -- Pharyngeal -- Pharyngeal- Nectar Straw -- Pharyngeal -- Pharyngeal- Thin Teaspoon Delayed swallow initiation-vallecula Pharyngeal -- Pharyngeal- Thin Cup Delayed swallow initiation-vallecula;Delayed swallow initiation-pyriform sinuses;Penetration/Aspiration during swallow;Reduced epiglottic inversion Pharyngeal Material does not enter airway;Material enters airway, remains ABOVE  vocal cords then ejected out Pharyngeal- Thin Straw Delayed swallow initiation-pyriform sinuses;Reduced epiglottic inversion;Penetration/Aspiration during swallow Pharyngeal Material does not enter airway;Material enters airway, remains ABOVE vocal cords then ejected out Pharyngeal- Puree WFL;Delayed swallow initiation-vallecula Pharyngeal -- Pharyngeal- Mechanical Soft WFL;Delayed swallow initiation-vallecula;Pharyngeal residue - valleculae Pharyngeal -- Pharyngeal- Regular -- Pharyngeal -- Pharyngeal- Multi-consistency -- Pharyngeal -- Pharyngeal- Pill Penetration/Aspiration  during swallow Pharyngeal Material enters airway, remains ABOVE vocal cords then ejected out Pharyngeal Comment (No Data)  CHL IP CERVICAL ESOPHAGEAL PHASE 03/02/2017 Cervical Esophageal Phase WFL Pudding Teaspoon -- Pudding Cup -- Honey Teaspoon -- Honey Cup -- Nectar Teaspoon -- Nectar Cup -- Nectar Straw -- Thin Teaspoon -- Thin Cup -- Thin Straw -- Puree -- Mechanical Soft -- Regular -- Multi-consistency -- Pill -- Cervical Esophageal Comment -- No flowsheet data found. Thank you, Genene Churn, [place] 03/02/2017, 2:48 PM                   Scheduled Meds: . chlorhexidine  15 mL Mouth Rinse BID  . enoxaparin (LOVENOX) injection  80 mg Subcutaneous Q12H  . famotidine  20 mg Oral Daily  . guaiFENesin  1,200 mg Oral BID  . ipratropium-albuterol  3 mL Nebulization Q6H WA  . levothyroxine  125 mcg Oral QAC breakfast  . mouth rinse  15 mL Mouth Rinse q12n4p  . methylPREDNISolone (SOLU-MEDROL) injection  60 mg Intravenous Q8H  . ticagrelor  60 mg Oral BID  . Warfarin - Pharmacist Dosing Inpatient   Does not apply Q24H   Continuous Infusions: . sodium chloride       LOS: 12 days    Time spent: 11mins    Kathie Dike, MD [facility] Pager [phone]  If 7PM-7AM, please contact night-coverage [email or web address] Password [facility] 03/03/2017, 4:48 PM

## 2017-03-04 LAB — GLUCOSE, CAPILLARY
GLUCOSE-CAPILLARY: 118 mg/dL — AB (ref 65–99)
Glucose-Capillary: 137 mg/dL — ABNORMAL HIGH (ref 65–99)
Glucose-Capillary: 105 mg/dL — ABNORMAL HIGH (ref 65–99)

## 2017-03-04 LAB — PROTIME-INR
INR: 1.28
Prothrombin Time: 15.8 seconds — ABNORMAL HIGH (ref 11.4–15.2)

## 2017-03-04 MED ORDER — WARFARIN SODIUM 5 MG PO TABS
5.0000 mg | ORAL_TABLET | Freq: Once | ORAL | Status: AC
  Administered 2017-03-04: 5 mg via ORAL
  Filled 2017-03-04: qty 1

## 2017-03-04 MED ORDER — METHYLPREDNISOLONE SODIUM SUCC 125 MG IJ SOLR
60.0000 mg | Freq: Two times a day (BID) | INTRAMUSCULAR | Status: DC
  Administered 2017-03-05: 60 mg via INTRAVENOUS
  Filled 2017-03-04 (×2): qty 2

## 2017-03-04 MED ORDER — KETOROLAC TROMETHAMINE 15 MG/ML IJ SOLN
15.0000 mg | Freq: Four times a day (QID) | INTRAMUSCULAR | Status: DC | PRN
  Administered 2017-03-04: 15 mg via INTRAVENOUS
  Filled 2017-03-04: qty 1

## 2017-03-04 NOTE — Progress Notes (Signed)
PROGRESS NOTE    [name]  PXT:[id number] DOB: 21-Jan-1950 DOA: 02/19/2017 PCP: [name], NP    Brief Narrative:  [name] y.o.female,w hx of CAD, Hypothyroidism, PE, Factor V leiden, Copd on home o2, apparently c/o increase in dyspnea for 24 hours prior to admission. Slight cough, dry-yellow sputum. Denies fever, chills, cp, palp, n/v, diarrhea, brbpr, black stool. Pt presents to ED due to increase in dyspnea.   CXR demonstrated acute on chronic pulmonary interstitial opacity; CTA neg for PE. Positive influenza A  Assessment & Plan:   Principal Problem:   COPD exacerbation ([place]) Active Problems:   Hypothyroidism   Factor V Leiden, prothrombin gene mutation ([place])   Tachycardia   Influenza A   Acute respiratory failure ([place])   1. Acute on chronic respiratory failure with hypoxia and hypercapnia.  Related to influenza infection as well as COPD exacerbation.  Briefly required intubation, and subsequently required BiPAP.  She has since been weaned down to nasal cannula.  CT chest was negative for pulmonary embolus.  Continues to slowly improve 2. Influenza A.  Treated with Tamiflu 3. COPD exacerbation.  Currently on intravenous steroids and bronchodilators.  Pulmonology following.  Progress has been slow, but she is slowly improving.  Will start to wean steroids.  Continue current treatments 4. 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  On lifelong anticoagulation.  Currently on Coumadin with Lovenox bridge.  INR subtherapeutic 5. Hypothyroidism.  Continue on Synthroid. 6. Dysphagia.  Seen by speech therapy for swallow evaluation.  Patient underwent modified barium swallow and recommendations are for dysphagia 3 diet. 7. Coronary artery disease.  No complaints of chest pain.  Continue on Brilinta.   DVT prophylaxis:  Lovenox bridge, Coumadin Code Status: DNR.  Confirmed by patient. Family Communication: No family present Disposition Plan: May need  placement.  Will request physical therapy evaluation.   Consultants:   Pulmonology  Procedures:     Antimicrobials:      Subjective: Feeling better.  Continues to have cough.  Did not wear BiPAP overnight.  Wants to go home.  Still feels weak.  Objective: Vitals:   03/04/17 0700 03/04/17 0816 03/04/17 1231 03/04/17 1500  BP:      Pulse: 72     Resp: (!) 28     Temp:  98 F (36.7 C) 98.1 F (36.7 C)   TempSrc:  Oral Oral   SpO2: 99% 99%  96%  Weight:      Height:        Intake/Output Summary (Last 24 hours) at 03/04/2017 1715 Last data filed at 03/04/2017 0500 Gross per 24 hour  Intake -  Output 225 ml  Net -225 ml   Filed Weights   03/02/17 0500 03/03/17 0400 03/04/17 0500  Weight: 71.8 kg (158 lb 4.6 oz) 72 kg (158 lb 11.7 oz) 71.4 kg (157 lb 6.5 oz)    Examination:  General exam: Alert, awake, oriented x 3 Respiratory system: Diminished breath sounds bilaterally. Respiratory effort normal. Cardiovascular system:RRR. No murmurs, rubs, gallops. Gastrointestinal system: Abdomen is nondistended, soft and nontender. No organomegaly or masses felt. Normal bowel sounds heard. Central nervous system: Alert and oriented. No focal neurological deficits. Extremities: No C/C/E, +pedal pulses Skin: No rashes, lesions or ulcers Psychiatry: Judgement and insight appear normal. Mood & affect appropriate.    Data Reviewed: I have personally reviewed following labs and imaging studies  CBC: Recent Labs  Lab 02/27/17 0416 02/28/17 0626 03/01/17 0534 03/02/17 0432 03/03/17 0459  WBC 12.7*  8.0 13.3* 8.6 15.8*  NEUTROABS 10.8* 6.4 10.8* 7.2 14.3*  HGB 12.0 11.9* 12.6 12.5 12.8  HCT 38.6 37.6 40.3 40.2 41.4  MCV 92.1 90.8 90.4 90.3 91.6  PLT 214 191 247 216 161   Basic Metabolic Panel: Recent Labs  Lab 02/27/17 0416 02/28/17 0626 03/01/17 0534 03/02/17 0432 03/03/17 0459  NA 143 139 141 142 139  K 3.7 4.3 3.9 4.3 4.5  CL 96* 97* 99* 102 100*  CO2 36* 34*  32 31 31  GLUCOSE 116* 117* 95 121* 126*  BUN 30* 28* 30* 29* 26*  CREATININE 0.81 0.74 0.72 0.68 0.66  CALCIUM 8.3* 8.3* 8.5* 8.2* 8.2*   GFR: Estimated Creatinine Clearance: 68.6 mL/min (by C-G formula based on SCr of 0.66 mg/dL). Liver Function Tests: Recent Labs  Lab 02/27/17 0416 02/28/17 0626 03/01/17 0534 03/02/17 0432 03/03/17 0459  AST 80* 44* 35 50* 42*  ALT 178* 123* 105* 104* 105*  ALKPHOS 69 56 60 58 57  BILITOT 1.0 1.0 0.9 1.0 0.5  PROT 6.1* 5.5* 6.2* 5.7* 5.5*  ALBUMIN 2.7* 2.5* 2.8* 2.6* 2.5*   No results for input(s): LIPASE, AMYLASE in the last 168 hours. No results for input(s): AMMONIA in the last 168 hours. Coagulation Profile: Recent Labs  Lab 03/03/17 0459 03/04/17 0357  INR 1.11 1.28   Cardiac Enzymes: No results for input(s): CKTOTAL, CKMB, CKMBINDEX, TROPONINI in the last 168 hours. BNP (last 3 results) No results for input(s): PROBNP in the last 8760 hours. HbA1C: No results for input(s): HGBA1C in the last 72 hours. CBG: Recent Labs  Lab 03/03/17 1635 03/03/17 2007 03/04/17 0055 03/04/17 0415 03/04/17 0746  GLUCAP 128* 164* 137* 118* 105*   Lipid Profile: No results for input(s): CHOL, HDL, LDLCALC, TRIG, CHOLHDL, LDLDIRECT in the last 72 hours. Thyroid Function Tests: No results for input(s): TSH, T4TOTAL, FREET4, T3FREE, THYROIDAB in the last 72 hours. Anemia Panel: No results for input(s): VITAMINB12, FOLATE, FERRITIN, TIBC, IRON, RETICCTPCT in the last 72 hours. Sepsis Labs: No results for input(s): PROCALCITON, LATICACIDVEN in the last 168 hours.  No results found for this or any previous visit (from the past 240 hour(s)).       Radiology Studies: No results found.      Scheduled Meds: . chlorhexidine  15 mL Mouth Rinse BID  . enoxaparin (LOVENOX) injection  80 mg Subcutaneous Q12H  . famotidine  20 mg Oral Daily  . guaiFENesin  1,200 mg Oral BID  . ipratropium-albuterol  3 mL Nebulization Q6H WA  .  levothyroxine  125 mcg Oral QAC breakfast  . mouth rinse  15 mL Mouth Rinse q12n4p  . methylPREDNISolone (SOLU-MEDROL) injection  60 mg Intravenous Q12H  . ticagrelor  60 mg Oral BID  . Warfarin - Pharmacist Dosing Inpatient   Does not apply Q24H   Continuous Infusions: . sodium chloride       LOS: 13 days    Time spent: 3mins    Kathie Dike, MD [facility] Pager [phone]  If 7PM-7AM, please contact night-coverage [email or web address] Password [facility] 03/04/2017, 5:15 PM

## 2017-03-04 NOTE — Progress Notes (Signed)
Subjective: [name] did well last night off CPAP.  [name] is on high flow nasal cannula and says [name] feels okay.  No new complaints.  No cough or congestion.  No shortness of breath.  Objective: Vital signs in last 24 hours: Temp:  [98 F (36.7 C)-98.9 F (37.2 C)] 98 F (36.7 C) (02/24 0816) Pulse Rate:  [70-101] 72 (02/24 0700) Resp:  [11-28] 28 (02/24 0700) BP: (104-144)/(50-96) 133/67 (02/24 0600) SpO2:  [96 %-100 %] 99 % (02/24 0816) Weight:  [71.4 kg (157 lb 6.5 oz)] 71.4 kg (157 lb 6.5 oz) (02/24 0500) Weight change: -0.6 kg (-5.2 oz) Last BM Date: 03/03/17  Intake/Output from previous day: 02/23 0701 - 02/24 0700 In: -  Out: 625 [Urine:625]  PHYSICAL EXAM General appearance: alert, cooperative and no distress Resp: clear to auscultation bilaterally Cardio: regular rate and rhythm, S1, S2 normal, no murmur, click, rub or gallop GI: soft, non-tender; bowel sounds normal; no masses,  no organomegaly Extremities: extremities normal, atraumatic, no cyanosis or edema  Lab Results:  Results for orders placed or performed during the hospital encounter of 02/19/17 (from the past 48 hour(s))  Glucose, capillary     Status: Abnormal   Collection Time: 03/02/17 12:14 PM  Result Value Ref Range   Glucose-Capillary 130 (H) 65 - 99 mg/dL   Comment 1 Notify RN    Comment 2 Document in Chart   Glucose, capillary     Status: Abnormal   Collection Time: 03/02/17  4:06 PM  Result Value Ref Range   Glucose-Capillary 157 (H) 65 - 99 mg/dL   Comment 1 Notify RN    Comment 2 Document in Chart   Glucose, capillary     Status: Abnormal   Collection Time: 03/02/17  8:24 PM  Result Value Ref Range   Glucose-Capillary 171 (H) 65 - 99 mg/dL   Comment 1 Notify RN    Comment 2 Document in Chart   Glucose, capillary     Status: Abnormal   Collection Time: 03/02/17 11:57 PM  Result Value Ref Range   Glucose-Capillary 148 (H) 65 - 99 mg/dL   Comment 1 Notify RN    Comment 2 Document in Chart    CBC with Differential/Platelet     Status: Abnormal   Collection Time: 03/03/17  4:59 AM  Result Value Ref Range   WBC 15.8 (H) 4.0 - 10.5 K/uL   RBC 4.52 3.87 - 5.11 MIL/uL   Hemoglobin 12.8 12.0 - 15.0 g/dL   HCT 41.4 36.0 - 46.0 %   MCV 91.6 78.0 - 100.0 fL   MCH 28.3 26.0 - 34.0 pg   MCHC 30.9 30.0 - 36.0 g/dL   RDW 15.4 11.5 - 15.5 %   Platelets 242 150 - 400 K/uL   Neutrophils Relative % 90 %   Neutro Abs 14.3 (H) 1.7 - 7.7 K/uL   Lymphocytes Relative 6 %   Lymphs Abs 0.9 0.7 - 4.0 K/uL   Monocytes Relative 4 %   Monocytes Absolute 0.6 0.1 - 1.0 K/uL   Eosinophils Relative 0 %   Eosinophils Absolute 0.0 0.0 - 0.7 K/uL   Basophils Relative 0 %   Basophils Absolute 0.0 0.0 - 0.1 K/uL    Comment: Performed at [facility], [address] [place], [place] [address]  Comprehensive metabolic panel     Status: Abnormal   Collection Time: 03/03/17  4:59 AM  Result Value Ref Range   Sodium 139 135 - 145 mmol/L  Potassium 4.5 3.5 - 5.1 mmol/L   Chloride 100 (L) 101 - 111 mmol/L   CO2 31 22 - 32 mmol/L   Glucose, Bld 126 (H) 65 - 99 mg/dL   BUN 26 (H) 6 - 20 mg/dL   Creatinine, Ser 0.66 0.44 - 1.00 mg/dL   Calcium 8.2 (L) 8.9 - 10.3 mg/dL   Total Protein 5.5 (L) 6.5 - 8.1 g/dL   Albumin 2.5 (L) 3.5 - 5.0 g/dL   AST 42 (H) 15 - 41 U/L   ALT 105 (H) 14 - 54 U/L   Alkaline Phosphatase 57 38 - 126 U/L   Total Bilirubin 0.5 0.3 - 1.2 mg/dL   GFR calc non Af Amer >60 >60 mL/min   GFR calc Af Amer >60 >60 mL/min    Comment: (NOTE) The eGFR has been calculated using the CKD EPI equation. This calculation has not been validated in all clinical situations. eGFR's persistently <60 mL/min signify possible Chronic Kidney Disease.    Anion gap 8 5 - 15    Comment: Performed at [facility], [address] [place], [place] [address]  Protime-INR     Status: None   Collection Time: 03/03/17  4:59 AM  Result Value Ref Range   Prothrombin Time 14.2 11.4 - 15.2 seconds   INR 1.11      Comment: Performed at [facility], [address], [place], [place] [address]  Glucose, capillary     Status: Abnormal   Collection Time: 03/03/17  5:25 AM  Result Value Ref Range   Glucose-Capillary 126 (H) 65 - 99 mg/dL   Comment 1 Notify RN    Comment 2 Document in Chart   Glucose, capillary     Status: Abnormal   Collection Time: 03/03/17  8:04 AM  Result Value Ref Range   Glucose-Capillary 130 (H) 65 - 99 mg/dL  Glucose, capillary     Status: Abnormal   Collection Time: 03/03/17 11:20 AM  Result Value Ref Range   Glucose-Capillary 176 (H) 65 - 99 mg/dL  Glucose, capillary     Status: Abnormal   Collection Time: 03/03/17  4:35 PM  Result Value Ref Range   Glucose-Capillary 128 (H) 65 - 99 mg/dL  Glucose, capillary     Status: Abnormal   Collection Time: 03/03/17  8:07 PM  Result Value Ref Range   Glucose-Capillary 164 (H) 65 - 99 mg/dL  Glucose, capillary     Status: Abnormal   Collection Time: 03/04/17 12:55 AM  Result Value Ref Range   Glucose-Capillary 137 (H) 65 - 99 mg/dL  Protime-INR     Status: Abnormal   Collection Time: 03/04/17  3:57 AM  Result Value Ref Range   Prothrombin Time 15.8 (H) 11.4 - 15.2 seconds   INR 1.28     Comment: Performed at [facility], [address], [place], [place] [address]  Glucose, capillary     Status: Abnormal   Collection Time: 03/04/17  4:15 AM  Result Value Ref Range   Glucose-Capillary 118 (H) 65 - 99 mg/dL  Glucose, capillary     Status: Abnormal   Collection Time: 03/04/17  7:46 AM  Result Value Ref Range   Glucose-Capillary 105 (H) 65 - 99 mg/dL    ABGS No results for input(s): PHART, PO2ART, TCO2, HCO3 in the last 72 hours.  Invalid input(s): PCO2 CULTURES No results found for this or any previous visit (from the past 240 hour(s)). Studies/Results: Dg Swallowing Func-speech Pathology  Result Date: 03/02/2017  Objective Swallowing Evaluation: Type of Study: MBS-Modified Barium Swallow Study  Patient Details Name:  [name] MRN: [id number] Date of Birth: 06/17/1950 Today's Date: 03/02/2017 Time: SLP Start Time (ACUTE ONLY): 1035 -SLP Stop Time (ACUTE ONLY): 1100 SLP Time Calculation (min) (ACUTE ONLY): 25 min Past Medical History: Past Medical History: Diagnosis Date . Acute MI ([place])   x4, code blue x3 . Arteriosclerotic cardiovascular disease (ASCVD) 2002  Inf STEMI-2002. 2003-cutting balloon + brachytherapy for restenosis; subsequent acute stent thrombosis 06/2010 requiring 2 separate interventions (Zeta stent, then repeat cath with thrombectomy). focal basal inf AK, nl EF; 03/2011: Patent stents, minor nonobst  residual dz, nl EF; neg stress nuclear in 2008 and stress echo in 2009 . Chronic anticoagulation   Warfarin plus ticagrelor . Chronic respiratory failure ([place]) 08/27/2013  On 2L 02 . 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 Factor V Leiden, prothrombin gene mutation ([place]) 2006 . Hyperlipidemia  . Hypothyroidism  . Noncompliance  . Pelvic fracture ([place]) 2009 . Pulmonary embolism ([place]) 2006  Associated with deep vein thrombosis-2006; + factor V Leiden . Tobacco abuse   50 pack years Past Surgical History: Past Surgical History: Procedure Laterality Date . COLONOSCOPY  Approximately 2000  Negative screening study . CORONARY ANGIOPLASTY  2002, 2003, 2012 . LEFT AND RIGHT HEART CATHETERIZATION WITH CORONARY ANGIOGRAM N/A 04/03/2011  Procedure: LEFT AND RIGHT HEART CATHETERIZATION WITH CORONARY ANGIOGRAM;  Surgeon: Sherren Mocha, MD;  Location: [facility] CATH LAB;  Service: Cardiovascular;  Laterality: N/A; HPI: [name]  is a 67 y.o. female, w hx of CAD, Hypothyroidism, PE, Factor V leiden, Copd on home o2, apparently c/o increase in dyspnea for 24 hours prior to admission.  Slight cough, dry-yellow sputum.  Denies fever, chills, cp, palp, n/v, diarrhea, brbpr, black stool.  Pt presents to ED due to increase in dyspnea.  Subjective: "I want a Sprite." Assessment / Plan / Recommendation CHL IP  CLINICAL IMPRESSIONS 03/02/2017 Clinical Impression Pt presents with min oropharyngeal phase dysphagia characterized by weak lingual manipulation of solids and impaired mastication due to edentulous status resulting in piecemeal deglutition and delayed oral prep with solids, min delay in swallow initiation with swallow trigger after filling the valleculae and spilling to the pyriforms with cup/straw sips thin resulting in variable flash penetration with thins without aspiration. Pt with spontaneous cough with one episode of deeper penetration and it was immediately removed (this also occurred when taking barium tablet). Pt noted to cough at bedside during po trials and suspect this due to element of possible penetration and/or respiratory compromise (COPD). Recommend D3/mech soft with thin liquids with standard aspiration and reflux precautions; po only when Pt is alert and upright. SLP will continue to follow. Recommend PT evaluation to help determine needs post acute. SLP Visit Diagnosis Dysphagia, oropharyngeal phase (R13.12) Attention and concentration deficit following -- Frontal lobe and executive function deficit following -- Impact on safety and function Mild aspiration risk   CHL IP TREATMENT RECOMMENDATION 03/02/2017 Treatment Recommendations Therapy as outlined in treatment plan below   Prognosis 03/02/2017 Prognosis for Safe Diet Advancement Good Barriers to Reach Goals -- Barriers/Prognosis Comment -- CHL IP DIET RECOMMENDATION 03/02/2017 SLP Diet Recommendations Dysphagia 3 (Mech soft) solids;Thin liquid Liquid Administration via Cup;Straw Medication Administration Whole meds with liquid Compensations Slow rate;Small sips/bites;Multiple dry swallows after each bite/sip Postural Changes Remain semi-upright after after feeds/meals (Comment);Seated upright at 90 degrees   CHL IP OTHER RECOMMENDATIONS 03/02/2017 Recommended Consults -- Oral Care Recommendations Oral care BID;Staff/trained  caregiver to provide  oral care Other Recommendations Clarify dietary restrictions   CHL IP FOLLOW UP RECOMMENDATIONS 03/02/2017 Follow up Recommendations Skilled Nursing facility   [facility] IP FREQUENCY AND DURATION 03/02/2017 Speech Therapy Frequency (ACUTE ONLY) min 2x/week Treatment Duration 1 week      CHL IP ORAL PHASE 03/02/2017 Oral Phase Impaired Oral - Pudding Teaspoon -- Oral - Pudding Cup -- Oral - Honey Teaspoon -- Oral - Honey Cup -- Oral - Nectar Teaspoon -- Oral - Nectar Cup -- Oral - Nectar Straw -- Oral - Thin Teaspoon -- Oral - Thin Cup -- Oral - Thin Straw -- Oral - Puree -- Oral - Mech Soft Impaired mastication;Lingual/palatal residue;Piecemeal swallowing;Delayed oral transit;Decreased bolus cohesion Oral - Regular -- Oral - Multi-Consistency -- Oral - Pill -- Oral Phase - Comment Pt edentulous  CHL IP PHARYNGEAL PHASE 03/02/2017 Pharyngeal Phase Impaired Pharyngeal- Pudding Teaspoon -- Pharyngeal -- Pharyngeal- Pudding Cup -- Pharyngeal -- Pharyngeal- Honey Teaspoon -- Pharyngeal -- Pharyngeal- Honey Cup -- Pharyngeal -- Pharyngeal- Nectar Teaspoon -- Pharyngeal -- Pharyngeal- Nectar Cup -- Pharyngeal -- Pharyngeal- Nectar Straw -- Pharyngeal -- Pharyngeal- Thin Teaspoon Delayed swallow initiation-vallecula Pharyngeal -- Pharyngeal- Thin Cup Delayed swallow initiation-vallecula;Delayed swallow initiation-pyriform sinuses;Penetration/Aspiration during swallow;Reduced epiglottic inversion Pharyngeal Material does not enter airway;Material enters airway, remains ABOVE vocal cords then ejected out Pharyngeal- Thin Straw Delayed swallow initiation-pyriform sinuses;Reduced epiglottic inversion;Penetration/Aspiration during swallow Pharyngeal Material does not enter airway;Material enters airway, remains ABOVE vocal cords then ejected out Pharyngeal- Puree WFL;Delayed swallow initiation-vallecula Pharyngeal -- Pharyngeal- Mechanical Soft WFL;Delayed swallow initiation-vallecula;Pharyngeal residue - valleculae Pharyngeal --  Pharyngeal- Regular -- Pharyngeal -- Pharyngeal- Multi-consistency -- Pharyngeal -- Pharyngeal- Pill Penetration/Aspiration during swallow Pharyngeal Material enters airway, remains ABOVE vocal cords then ejected out Pharyngeal Comment (No Data)  CHL IP CERVICAL ESOPHAGEAL PHASE 03/02/2017 Cervical Esophageal Phase WFL Pudding Teaspoon -- Pudding Cup -- Honey Teaspoon -- Honey Cup -- Nectar Teaspoon -- Nectar Cup -- Nectar Straw -- Thin Teaspoon -- Thin Cup -- Thin Straw -- Puree -- Mechanical Soft -- Regular -- Multi-consistency -- Pill -- Cervical Esophageal Comment -- No flowsheet data found. Thank you, Genene Churn, [place] 03/02/2017, 2:48 PM               Medications:  Prior to Admission:  Medications Prior to Admission  Medication Sig Dispense Refill Last Dose  . albuterol (PROVENTIL) (2.5 MG/3ML) 0.083% nebulizer solution USE 1 VIAL BY NEBULIZER EVERY 4 HOURS AS NEEDED FOR WHEEZING. DX: J44.9 375 vial 0 Past Month at Unknown time  . budesonide (PULMICORT) 0.5 MG/2ML nebulizer solution Take 2 mLs (0.5 mg total) by nebulization 2 (two) times daily. Dx: J43.9 120 mL 3 Past Month at Unknown time  . fentaNYL (DURAGESIC - DOSED MCG/HR) 50 MCG/HR Place 50 mcg onto the skin every 3 (three) days.   Past Month at Unknown time  . fish oil-omega-3 fatty acids 1000 MG capsule Take 2 g by mouth daily.     Past Month at Unknown time  . ipratropium-albuterol (DUONEB) 0.5-2.5 (3) MG/3ML SOLN 1 vial via nebulizer at breakfast, lunch, dinner and bedtime daily. May take 2 additional treatments on bad day Dx: J43.9 360 mL 3 Past Month at Unknown time  . levothyroxine (SYNTHROID, LEVOTHROID) 125 MCG tablet take 1 tablet by mouth once daily 90 tablet 2 Past Month at Unknown time  . OXYGEN Inhale 2 L into the lungs continuous.   Past Month at Unknown time  . warfarin (COUMADIN) 1 MG tablet Take  1 tablet (48m) on Tuesdays and Saturdays along with a 562mtablet (Patient taking differently: Take  1 mg by mouth 2 (two) times a week. Take 1 tablet (72m272mon Tuesdays and Fridays along with a 5mg72mblet) 30 tablet 4 Past Month at Unknown time  . warfarin (COUMADIN) 5 MG tablet Take 1 tablet (5 mg total) by mouth daily. (Patient taking differently: Take 5-6 mg by mouth daily. 6mg 19mSaturdays, 5mg o38munday, Monday, 6mg on6mesdays, 5mg on 6mnesdays, 6mg on T77msdays, and 5mg on Fr54mys.) 90 tablet 3 Past Month at Unknown time  . nitroGLYCERIN (NITROSTAT) 0.4 MG SL tablet Place 1 tablet (0.4 mg total) under the tongue every 5 (five) minutes as needed for chest pain. 25 tablet 3 unknown  . Respiratory Therapy Supplies (FLUTTER) DEVI Use as directed 1 each 0 Taking   Scheduled: . chlorhexidine  15 mL Mouth Rinse BID  . enoxaparin (LOVENOX) injection  80 mg Subcutaneous Q12H  . famotidine  20 mg Oral Daily  . guaiFENesin  1,200 mg Oral BID  . ipratropium-albuterol  3 mL Nebulization Q6H WA  . levothyroxine  125 mcg Oral QAC breakfast  . mouth rinse  15 mL Mouth Rinse q12n4p  . methylPREDNISolone (SOLU-MEDROL) injection  60 mg Intravenous Q12H  . ticagrelor  60 mg Oral BID  . warfarin  5 mg Oral Once  . Warfarin - Pharmacist Dosing Inpatient   Does not apply Q24H   Continuous: . sodium chloride     PRN:[id number] albuterol, LORazepam, metoprolol tartrate  Assesment: [name] was admitted with influenza A COPD exacerbation and acute respiratory failure.  Her PCO2 went to over 100 and [name] was intubated and placed on mechanical ventilation.  [name] has now been off the ventilator for about 96 hours.  [name] has finished treatment for the flu.  [name] seem to be septic on admission and [name] has been treated with antibiotics and [name] is better.  COPD exacerbation is being treated with steroids and bronchodilators Principal Problem:   COPD exacerbation (HCC) ActivGrand Cotea[place]blems:   Hypothyroidism   Factor V Leiden, prothrombin gene mutation (HCC)   Tachycardia   Influenza A   Acute respiratory failure  (HCC)    [place] Continue current treatments [name] is improving.  I will investigate whether [name] is a candidate for the COPD pilot program through advanced home care    LOS: 13 days   [name] L 03/04/2017, 9:57 AM

## 2017-03-04 NOTE — Progress Notes (Signed)
[place] for Warfarin >> LOVENOX >> resume Warfarin 2/22 Indication: Chronic warfarin, Hx PE/DVT/+ factor V Leiden  Allergies  Allergen Reactions  . Dilaudid [Hydromorphone Hcl] Hives and Nausea Only  . Minocycline Hcl     REACTION: Dizzy  . Prednisone     REACTION: feels like throat swelling  . Varenicline Tartrate     REACTION: Dizzy(chantix)   . Zocor [Simvastatin - High Dose] Other (See Comments)    myalgia   Patient Measurements: Height: 5\' 5"  (165.1 cm) Weight: 157 lb 6.5 oz (71.4 kg) IBW/kg (Calculated) : 57  Vital Signs: Temp: 98.6 F (37 C) (02/24 0400) Temp Source: Oral (02/24 0400) BP: 133/67 (02/24 0600) Pulse Rate: 72 (02/24 0700)  Labs: Recent Labs    03/02/17 0432 03/03/17 0459 03/04/17 0357  HGB 12.5 12.8  --   HCT 40.2 41.4  --   PLT 216 242  --   LABPROT  --  14.2 15.8*  INR  --  1.11 1.28  CREATININE 0.68 0.66  --    Estimated Creatinine Clearance: 68.6 mL/min (by C-G formula based on SCr of 0.66 mg/dL).  Medical History: Past Medical History:  Diagnosis Date  . Acute MI ([place])    x4, code blue x3  . Arteriosclerotic cardiovascular disease (ASCVD) 2002   Inf STEMI-2002. 2003-cutting balloon + brachytherapy for restenosis; subsequent acute stent thrombosis 06/2010 requiring 2 separate interventions (Zeta stent, then repeat cath with thrombectomy). focal basal inf AK, nl EF; 03/2011: Patent stents, minor nonobst  residual dz, nl EF; neg stress nuclear in 2008 and stress echo in 2009  . Chronic anticoagulation    Warfarin plus ticagrelor  . Chronic respiratory failure ([place]) 08/27/2013   On 2L 02  . COPD (chronic obstructive pulmonary disease) ([place])    02 dependent  . Factor V Leiden, prothrombin gene mutation ([place]) 2006  . Hyperlipidemia   . Hypothyroidism   . Noncompliance   . Pelvic fracture ([place]) 2009  . Pulmonary embolism ([place]) 2006   Associated with deep vein thrombosis-2006; + factor V Leiden  .  Tobacco abuse    50 pack years   Medications:  Medications Prior to Admission  Medication Sig Dispense Refill Last Dose  . albuterol (PROVENTIL) (2.5 MG/3ML) 0.083% nebulizer solution USE 1 VIAL BY NEBULIZER EVERY 4 HOURS AS NEEDED FOR WHEEZING. DX: J44.9 375 vial 0 Past Month at Unknown time  . budesonide (PULMICORT) 0.5 MG/2ML nebulizer solution Take 2 mLs (0.5 mg total) by nebulization 2 (two) times daily. Dx: J43.9 120 mL 3 Past Month at Unknown time  . fentaNYL (DURAGESIC - DOSED MCG/HR) 50 MCG/HR Place 50 mcg onto the skin every 3 (three) days.   Past Month at Unknown time  . fish oil-omega-3 fatty acids 1000 MG capsule Take 2 g by mouth daily.     Past Month at Unknown time  . ipratropium-albuterol (DUONEB) 0.5-2.5 (3) MG/3ML SOLN 1 vial via nebulizer at breakfast, lunch, dinner and bedtime daily. May take 2 additional treatments on bad day Dx: J43.9 360 mL 3 Past Month at Unknown time  . levothyroxine (SYNTHROID, LEVOTHROID) 125 MCG tablet take 1 tablet by mouth once daily 90 tablet 2 Past Month at Unknown time  . OXYGEN Inhale 2 L into the lungs continuous.   Past Month at Unknown time  . warfarin (COUMADIN) 1 MG tablet Take 1 tablet (1mg ) on Tuesdays and Saturdays along with a 5mg  tablet (Patient taking differently: Take 1 mg by  mouth 2 (two) times a week. Take 1 tablet (1mg ) on Tuesdays and Fridays along with a 5mg  tablet) 30 tablet 4 Past Month at Unknown time  . warfarin (COUMADIN) 5 MG tablet Take 1 tablet (5 mg total) by mouth daily. (Patient taking differently: Take 5-6 mg by mouth daily. 6mg  on Saturdays, 5mg  on Sunday, Monday, 6mg  on Tuesdays, 5mg  on Wednesdays, 6mg  on Thursdays, and 5mg  on Fridays.) 90 tablet 3 Past Month at Unknown time  . nitroGLYCERIN (NITROSTAT) 0.4 MG SL tablet Place 1 tablet (0.4 mg total) under the tongue every 5 (five) minutes as needed for chest pain. 25 tablet 3 unknown  . Respiratory Therapy Supplies (FLUTTER) DEVI Use as directed 1 each 0 Taking     Home warfarin 5mg  daily except 6mg  Tuesday, Thursday and Saturdays per recent ambulatory anticoag flow sheet.    Assessment: Pt in ICU now extubated but requiring bipap and potentially may require reintubation.   Was transitioned to lovenox for anticoagulation full dose.  Now asked to place back on Warfarin, INR remains low.  Goal of Therapy:  INR 2-3 when on Coumadin   Plan:  Continue Lovenox 1mg /Kg SQ q12hrs until INR at goal Coumadin 5mg  today x 1 Monitor for CBC, signs and symptoms of bleeding.   Hart Robinsons A 03/04/2017,9:08 AM

## 2017-03-05 DIAGNOSIS — D6851 Activated protein C resistance: Secondary | ICD-10-CM

## 2017-03-05 DIAGNOSIS — E039 Hypothyroidism, unspecified: Secondary | ICD-10-CM

## 2017-03-05 DIAGNOSIS — J9601 Acute respiratory failure with hypoxia: Secondary | ICD-10-CM

## 2017-03-05 LAB — PROTIME-INR
INR: 1.24
Prothrombin Time: 15.5 seconds — ABNORMAL HIGH (ref 11.4–15.2)

## 2017-03-05 MED ORDER — ENOXAPARIN (LOVENOX) PATIENT EDUCATION KIT
PACK | Freq: Once | Status: DC
  Filled 2017-03-05: qty 1

## 2017-03-05 MED ORDER — WARFARIN SODIUM 7.5 MG PO TABS: 7.5000 mg | ORAL_TABLET | Freq: Once | ORAL | Status: DC

## 2017-03-05 MED ORDER — PREDNISOLONE SODIUM PHOSPHATE 10 MG PO TBDP: ORAL_TABLET | ORAL | 0 refills | Status: DC

## 2017-03-05 MED ORDER — GUAIFENESIN ER 600 MG PO TB12: 1200.0000 mg | ORAL_TABLET | Freq: Two times a day (BID) | ORAL | 0 refills | Status: AC

## 2017-03-05 MED ORDER — TICAGRELOR 60 MG PO TABS: 60.0000 mg | ORAL_TABLET | Freq: Two times a day (BID) | ORAL | 0 refills | Status: AC

## 2017-03-05 MED ORDER — ENOXAPARIN SODIUM 80 MG/0.8ML ~~LOC~~ SOLN: 80.0000 mg | Freq: Two times a day (BID) | SUBCUTANEOUS | 0 refills | Status: DC

## 2017-03-05 NOTE — Discharge Summary (Addendum)
Physician Discharge Summary  [name] MOQ:[id number] DOB: 1950/05/31 DOA: 02/19/2017  PCP: [name] Peng, NP Anticoagulation Clinic: [name] Oh RN Pulmonology Clinic: [name] Form NP Cardiology: [name]  Admit date: 02/19/2017 Discharge date: 03/05/2017  Admitted From: Home  Disposition: Home - PT REFUSING SNF, PT ALSO DECLINED 24/7 FAMILY SUPPORT (declined to go live with daughter in [place])  Recommendations for Outpatient Follow-up:  1. Home health nurse to check PT/INR in 2 days and report results to PCP and Anti-coagulation clinic 2. Follow up in Anticoagulation clinic in 4-5 days to check PT/INR and adjust warfarin doses as needed.  3. Please continue lovenox injections until PT/INR is therapeutic.   4. STOP lovenox injections when PT/INR therapeutic or for any bleeding complication/event.  5. Please obtain BMP/CBC in one week  Home Health:  PT [place] SUPPORT  Discharge Condition: STABLE   CODE STATUS: DNR   Brief Hospitalization Summary: Please see all hospital notes, images, labs for full details of this long hospitalization. FROM HPI:  [name]  is a 67 y.o. female, w hx of CAD, Hypothyroidism, PE, Factor V leiden, Copd on home o2, apparently c/o increase in dyspnea since last nite.  Slight cough, dry-yellow sputum.  Denies fever, chills, cp, palp, n/v, diarrhea, brbpr, black stool.  Pt presents to ED due to increase in dyspnea.    In ED,  CXR IMPRESSION: Acute on chronic pulmonary interstitial opacity. Top differential considerations are viral/atypical respiratory infection and interstitial edema. No definite pleural effusion or other acute cardiopulmonary abnormality.  Na 140, K 3.8, Bun 15, Creatinine 0.94 Ast 24, Alt 14 Wbc 8.8, Hgb 12.1, Plt 219 Trop <0.03  BNP 121.0  Influenza A positive  Pt will be admitted Copd exacerbation, influenza A.   Brief Narrative:  [name],w hx of CAD, Hypothyroidism, PE, Factor V leiden, Copd on home o2, apparently c/o increase in dyspnea for 24 hours prior to admission. Slight cough, dry-yellow sputum. Denies fever, chills, cp, palp, n/v, diarrhea, brbpr, black stool. Pt presents to ED due to increase in dyspnea.   CXR demonstrated acute on chronic pulmonary interstitial opacity; CTA neg for PE. Positive influenza A  Assessment & Plan:   Principal Problem:   COPD exacerbation ([place]) Active Problems:   Hypothyroidism   Factor V Leiden, prothrombin gene mutation ([place])   Tachycardia   Influenza A   Acute respiratory failure ([place])   1. Acute on chronic respiratory failure with hypoxia and hypercapnia.  Related to influenza infection as well as COPD exacerbation.  Briefly required intubation, and subsequently required BiPAP.  She has since been weaned down to nasal cannula.  CT chest was negative for pulmonary embolus.  Continues to slowly improve.  She is stable for discharge but refuses SNF.  I really think she needs rehab but she refuses to go.  Also she refuses to go and live with her daughter in [place].   2. Influenza A.  Treated with Tamiflu.   3. COPD exacerbation.  Currently on intravenous steroids and bronchodilators.  Pulmonology following.  She has been enrolled in the COPD pilot program.  Her overall progress has been slow, but she is quickly improving over the last 3 days.  Will start to wean steroids.  4. 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  On lifelong anticoagulation.  Currently on Coumadin with Lovenox bridge.  INR remains subtherapeutic.  She will need to discharge on lovenox injections.  I ordered for [facility] RN  to check PT/INR in 2 days and for patient to follow up in the anticoagulation clinic by the end of this week.  DC lovenox injections when INR>2 or for any bleeding complication or event.  Pt verbalized understanding.     5. Hypothyroidism.  Continue on Synthroid. 6. Dysphagia.  Seen by  speech therapy for swallow evaluation.  Patient underwent modified barium swallow and recommendations are for regular, thin. 7. Coronary artery disease.  No complaints of chest pain.  Continue on Brilinta.  DVT prophylaxis:  Lovenox bridge, Coumadin Code Status: DNR.  Confirmed by patient. Family Communication: No family present Disposition Plan: Refuses SNF, discharge home with [facility] services (COPD PILOT PROGRAM)  Consultants:   Pulmonology  Discharge Diagnoses:  Principal Problem:   COPD exacerbation ([place]) Active Problems:   Hypothyroidism   Factor V Leiden, prothrombin gene mutation ([place])   Tachycardia   Influenza A   Acute respiratory failure ([place])  Discharge Instructions: Discharge Instructions    Call MD for:  difficulty breathing, headache or visual disturbances   Complete by:  As directed    Call MD for:  extreme fatigue   Complete by:  As directed    Call MD for:  persistant dizziness or light-headedness   Complete by:  As directed    Call MD for:  persistant nausea and vomiting   Complete by:  As directed    Call MD for:  severe uncontrolled pain   Complete by:  As directed    Increase activity slowly   Complete by:  As directed      Allergies as of 03/05/2017      Reactions   Dilaudid [hydromorphone Hcl] Hives, Nausea Only   Minocycline Hcl    REACTION: Dizzy   Prednisone    REACTION: feels like throat swelling   Varenicline Tartrate    REACTION: Dizzy(chantix)   Zocor [simvastatin - High Dose] Other (See Comments)   myalgia      Medication List    STOP taking these medications   fentaNYL 50 MCG/HR Commonly known as:  DURAGESIC - dosed mcg/hr     TAKE these medications   albuterol (2.5 MG/3ML) 0.083% nebulizer solution Commonly known as:  PROVENTIL USE 1 VIAL BY NEBULIZER EVERY 4 HOURS AS NEEDED FOR WHEEZING. DX: J44.9   budesonide 0.5 MG/2ML nebulizer solution Commonly known as:  PULMICORT Take 2 mLs (0.5 mg total) by nebulization 2 (two)  times daily. Dx: J43.9   enoxaparin 80 MG/0.8ML injection Commonly known as:  LOVENOX Inject 0.8 mLs (80 mg total) into the skin every 12 (twelve) hours for 5 days.   fish oil-omega-3 fatty acids 1000 MG capsule Take 2 g by mouth daily.   FLUTTER Devi Use as directed   guaiFENesin 600 MG 12 hr tablet Commonly known as:  MUCINEX Take 2 tablets (1,200 mg total) by mouth 2 (two) times daily for 7 days.   ipratropium-albuterol 0.5-2.5 (3) MG/3ML Soln Commonly known as:  DUONEB 1 vial via nebulizer at breakfast, lunch, dinner and bedtime daily. May take 2 additional treatments on bad day Dx: J43.9   levothyroxine 125 MCG tablet Commonly known as:  SYNTHROID, LEVOTHROID take 1 tablet by mouth once daily   nitroGLYCERIN 0.4 MG SL tablet Commonly known as:  NITROSTAT Place 1 tablet (0.4 mg total) under the tongue every 5 (five) minutes as needed for chest pain.   OXYGEN Inhale 2 L into the lungs continuous.   prednisoLONE 10 MG disintegrating tablet Commonly known as:  ORAPRED ODT Take 3 po daily x 5 d, then 2 po daily x 5 d, then 1 po daily x 5 days, then stop   ticagrelor 60 MG Tabs tablet Commonly known as:  BRILINTA Take 1 tablet (60 mg total) by mouth 2 (two) times daily.   warfarin 1 MG tablet Commonly known as:  COUMADIN Take as directed. If you are unsure how to take this medication, talk to your nurse or doctor. Original instructions:  Take 1 tablet (1mg ) on Tuesdays and Saturdays along with a 5mg  tablet What changed:    how much to take  how to take this  when to take this  additional instructions   warfarin 5 MG tablet Commonly known as:  COUMADIN Take as directed. If you are unsure how to take this medication, talk to your nurse or doctor. Original instructions:  Take 1 tablet (5 mg total) by mouth daily. What changed:    how much to take  additional instructions      Follow-up Information    [name] Peng, NP. Schedule an appointment as soon as  possible for a visit on 03/08/2017.   Specialty:  Family Medicine Why:  Hosiptal Follow Up  1:00pm Contact information: [place] [address] [phone]        Herminio Commons, MD. Schedule an appointment as soon as possible for a visit on 03/12/2017.   Specialty:  Cardiology Why:  Hospital Follow Up 4:00pm Contact information: [place] [address] [phone]        Anti-coagulation (warfarin Clinic). Schedule an appointment as soon as possible for a visit on 03/09/2017.   Why:  check PT/INR and adjust warfarin doses  11:00 am at [facility] office.[place], [place]       Magdalen Spatz, NP. Schedule an appointment as soon as possible for a visit in 2 week(s).   Specialty:  Pulmonary Disease Why:  Hospital Follow Up  Contact information: [address] [place]  [address] [phone]        Herminio Commons, MD. Schedule an appointment as soon as possible for a visit in 2 week(s).   Specialty:  Cardiology Why:  Hospital Follow Up Contact information: [place] [address] [phone]          Allergies  Allergen Reactions  . Dilaudid [Hydromorphone Hcl] Hives and Nausea Only  . Minocycline Hcl     REACTION: Dizzy  . Prednisone     REACTION: feels like throat swelling  . Varenicline Tartrate     REACTION: Dizzy(chantix)   . Zocor [Simvastatin - High Dose] Other (See Comments)    myalgia   Allergies as of 03/05/2017      Reactions   Dilaudid [hydromorphone Hcl] Hives, Nausea Only   Minocycline Hcl    REACTION: Dizzy   Prednisone    REACTION: feels like throat swelling   Varenicline Tartrate    REACTION: Dizzy(chantix)   Zocor [simvastatin - High Dose] Other (See Comments)   myalgia      Medication List    STOP taking these medications   fentaNYL 50 MCG/HR Commonly known as:  DURAGESIC - dosed mcg/hr     TAKE these medications   albuterol (2.5 MG/3ML) 0.083%  nebulizer solution Commonly known as:  PROVENTIL USE 1 VIAL BY NEBULIZER EVERY 4 HOURS AS NEEDED FOR WHEEZING. DX: J44.9   budesonide 0.5 MG/2ML nebulizer solution Commonly known  as:  PULMICORT Take 2 mLs (0.5 mg total) by nebulization 2 (two) times daily. Dx: J43.9   enoxaparin 80 MG/0.8ML injection Commonly known as:  LOVENOX Inject 0.8 mLs (80 mg total) into the skin every 12 (twelve) hours for 5 days.   fish oil-omega-3 fatty acids 1000 MG capsule Take 2 g by mouth daily.   FLUTTER Devi Use as directed   guaiFENesin 600 MG 12 hr tablet Commonly known as:  MUCINEX Take 2 tablets (1,200 mg total) by mouth 2 (two) times daily for 7 days.   ipratropium-albuterol 0.5-2.5 (3) MG/3ML Soln Commonly known as:  DUONEB 1 vial via nebulizer at breakfast, lunch, dinner and bedtime daily. May take 2 additional treatments on bad day Dx: J43.9   levothyroxine 125 MCG tablet Commonly known as:  SYNTHROID, LEVOTHROID take 1 tablet by mouth once daily   nitroGLYCERIN 0.4 MG SL tablet Commonly known as:  NITROSTAT Place 1 tablet (0.4 mg total) under the tongue every 5 (five) minutes as needed for chest pain.   OXYGEN Inhale 2 L into the lungs continuous.   prednisoLONE 10 MG disintegrating tablet Commonly known as:  ORAPRED ODT Take 3 po daily x 5 d, then 2 po daily x 5 d, then 1 po daily x 5 days, then stop   ticagrelor 60 MG Tabs tablet Commonly known as:  BRILINTA Take 1 tablet (60 mg total) by mouth 2 (two) times daily.   warfarin 1 MG tablet Commonly known as:  COUMADIN Take as directed. If you are unsure how to take this medication, talk to your nurse or doctor. Original instructions:  Take 1 tablet (1mg ) on Tuesdays and Saturdays along with a 5mg  tablet What changed:    how much to take  how to take this  when to take this  additional instructions   warfarin 5 MG tablet Commonly known as:  COUMADIN Take as directed. If you are unsure how to take this medication,  talk to your nurse or doctor. Original instructions:  Take 1 tablet (5 mg total) by mouth daily. What changed:    how much to take  additional instructions       Procedures/Studies: Ct Angio Chest Pe W Or Wo Contrast  Result Date: 02/20/2017 CLINICAL DATA:  Acute onset of dyspnea. EXAM: CT ANGIOGRAPHY CHEST WITH CONTRAST TECHNIQUE: Multidetector CT imaging of the chest was performed using the standard protocol during bolus administration of intravenous contrast. Multiplanar CT image reconstructions and MIPs were obtained to evaluate the vascular anatomy. CONTRAST:  144mL ISOVUE-370 IOPAMIDOL (ISOVUE-370) INJECTION 76% COMPARISON:  CTA of the chest performed 08/25/2013, and chest radiograph performed 02/19/2017 FINDINGS: Cardiovascular:  There is no evidence of pulmonary embolus. The heart is normal in size. Scattered calcification is noted along the thoracic aorta and proximal great vessels. Mediastinum/Nodes: The patient's endotracheal tube is seen ending 2-3 cm above the carina. No definite mediastinal lymphadenopathy is seen. No pericardial effusion is identified. The visualized portions of the thyroid gland are diminutive and grossly unremarkable. No axillary lymphadenopathy is seen. Lungs/Pleura: Bilateral emphysema is noted, particularly at the upper lung lobes. Trace left-sided pleural fluid is noted. Bibasilar atelectasis or scarring is seen. There is no evidence of pneumothorax. No mass is identified. Upper Abdomen: The visualized portions of the liver and spleen are unremarkable. Musculoskeletal: No acute osseous abnormalities are identified. The visualized musculature is unremarkable in appearance. Review of the MIP images confirms the above findings. IMPRESSION: 1. No evidence of pulmonary embolus. 2. Bilateral emphysema, particularly  at the upper lung lobes. Trace left-sided pleural fluid. Bibasilar atelectasis or scarring. Electronically Signed   By: Garald Balding M.D.   On: 02/20/2017  05:39   Dg Chest Port 1 View  Result Date: 02/27/2017 CLINICAL DATA:  Respiratory failure, COPD EXAM: PORTABLE CHEST 1 VIEW COMPARISON:  02/26/2017 FINDINGS: There is hyperinflation of the lungs compatible with COPD. Cardiomegaly. Interstitial prominence has improved slightly. Mild residual interstitial edema suspected. Small bilateral effusions. Vascular congestion. IMPRESSION: COPD. Continued interstitial prominence likely interstitial edema, improved since prior study. Small effusions. Electronically Signed   By: Rolm Baptise M.D.   On: 02/27/2017 07:57   Dg Chest Port 1 View  Result Date: 02/26/2017 CLINICAL DATA:  Status postextubation.  Shortness of Breath EXAM: PORTABLE CHEST 1 VIEW COMPARISON:  Study obtained earlier in the day FINDINGS: Endotracheal tube and nasogastric tube have been removed. No pneumothorax. There is a small right pleural effusion. There is a minimal left pleural effusion with mild left base atelectasis. There is generalized interstitial edema. No consolidation. Heart is mildly enlarged with pulmonary venous hypertension. No adenopathy. No bone lesions. IMPRESSION: No pneumothorax. Findings felt to represent a degree of congestive heart failure. Mild left base atelectasis. Electronically Signed   By: Lowella Grip III M.D.   On: 02/26/2017 12:36   Dg Chest Port 1 View  Result Date: 02/26/2017 CLINICAL DATA:  Hypoxia EXAM: PORTABLE CHEST 1 VIEW COMPARISON:  March 06, 2017 FINDINGS: Endotracheal tube tip is 6.0 cm above the carina. Nasogastric tube tip and side port are below the diaphragm. No evident pneumothorax. There is a small right pleural effusion. There is mild bibasilar atelectasis. There is mild cardiomegaly with pulmonary venous hypertension. There is aortic atherosclerosis. No adenopathy. No bone lesions. IMPRESSION: Tube positions as described without pneumothorax. There is pulmonary vascular congestion with small right pleural effusion. Bibasilar  atelectasis. No frank edema or consolidation. There is aortic atherosclerosis. Aortic Atherosclerosis (ICD10-I70.0). Electronically Signed   By: Lowella Grip III M.D.   On: 02/26/2017 08:20   Dg Chest Port 1 View  Result Date: 02/24/2017 CLINICAL DATA:  Respiratory failure EXAM: PORTABLE CHEST 1 VIEW COMPARISON:  02/23/2017 FINDINGS: Endotracheal tube in good position.  NG in place. Cardiac enlargement without heart failure.  Lungs remain clear. IMPRESSION: No active disease. Electronically Signed   By: Franchot Gallo M.D.   On: 02/24/2017 08:27   Dg Chest Port 1 View  Result Date: 02/23/2017 CLINICAL DATA:  Respiratory failure. EXAM: PORTABLE CHEST 1 VIEW COMPARISON:  02/22/2017.  02/21/2017.  CT 02/20/2017. FINDINGS: Endotracheal tube and NG tube in stable position. Stable mild cardiomegaly. Very mild bilateral interstitial prominence again noted. These changes may be related to very mild interstitial edema and/or pneumonitis. Small bilateral pleural effusions appear to be present on today's exam. No pneumothorax. IMPRESSION: 1.  Endotracheal tube and NG tube in stable position. 2. Stable mild cardiomegaly. Stable mild bilateral interstitial prominence and small bilateral pleural effusions. Findings suggest very mild changes of CHF. Pneumonitis cannot be excluded. Electronically Signed   By: Marcello Moores  Register   On: 02/23/2017 07:22   Dg Chest Port 1 View  Result Date: 02/22/2017 CLINICAL DATA:  Respiratory failure. EXAM: PORTABLE CHEST 1 VIEW COMPARISON:  02/21/2017. FINDINGS: Endotracheal tube and NG tube in stable position. Cardiomegaly again noted. Mild bilateral interstitial prominence noted. Findings suggest mild interstitial edema. Pneumonitis cannot be excluded. Small bilateral pleural effusions cannot be excluded. No pneumothorax. IMPRESSION: 1.  Endotracheal tube and NG tube in stable position. 2.  Cardiomegaly with mild pulmonary vascular prominence and bilateral interstitial prominence  suggesting mild CHF. Small bilateral pleural effusions cannot be excluded. Electronically Signed   By: Marcello Moores  Register   On: 02/22/2017 05:51   Dg Chest Port 1 View  Result Date: 02/21/2017 CLINICAL DATA:  Hypoxia EXAM: PORTABLE CHEST 1 VIEW COMPARISON:  Chest radiograph February 19, 2017 and chest CT February 20, 2017 FINDINGS: Endotracheal tube tip is 5.0 cm above the carina. Nasogastric tube tip and side port below the diaphragm. No pneumothorax. There is atelectatic change in the left base. There is a minimal right pleural effusion. Lungs elsewhere are clear. There is mild cardiomegaly with pulmonary venous hypertension. No adenopathy. No bone lesions. There is aortic atherosclerosis. IMPRESSION: Tube positions as described without pneumothorax. Pulmonary vascular congestion. Mild left base atelectasis and small right pleural effusion, stable. No new opacity evident. There is aortic atherosclerosis. Aortic Atherosclerosis (ICD10-I70.0). Electronically Signed   By: Lowella Grip III M.D.   On: 02/21/2017 07:50   Dg Chest Port 1 View  Result Date: 02/19/2017 CLINICAL DATA:  67 year old female with cough fever and shortness of breath for 3 days. Bilateral lower extremity edema. EXAM: PORTABLE CHEST 1 VIEW COMPARISON:  08/27/2013 chest radiographs and earlier. FINDINGS: Portable AP upright view at 1559 hrs. Mediastinal contours remain normal. Visualized tracheal air column is within normal limits. Stable lung volumes. Chronic increased pulmonary interstitial opacity, but there is increased pulmonary interstitium or vascularity compared to prior studies. Chronic blunting of the both costophrenic angles. No pneumothorax. No pleural effusion suspected. No confluent pulmonary opacity. IMPRESSION: Acute on chronic pulmonary interstitial opacity. Top differential considerations are viral/atypical respiratory infection and interstitial edema. No definite pleural effusion or other acute cardiopulmonary  abnormality. Electronically Signed   By: Genevie Ann M.D.   On: 02/19/2017 16:07   Dg Chest Port 1v Same Day  Result Date: 02/19/2017 CLINICAL DATA:  Endotracheal tube placement. Respiratory distress, acute onset. EXAM: PORTABLE CHEST 1 VIEW COMPARISON:  Chest radiograph performed earlier today at 3:59 p.m. FINDINGS: The patient's endotracheal tube is seen ending 5 cm above the carina. An enteric tube is noted extending below the diaphragm. Mild vascular congestion is noted. Previously noted increased interstitial markings are improved. A small right pleural effusion is noted. No pneumothorax is seen. The cardiomediastinal silhouette is normal in size. No acute osseous abnormalities are identified. IMPRESSION: 1. Endotracheal tube seen ending 5 cm above the carina. 2. Mild vascular congestion. Interval improvement in increased interstitial markings. Small right pleural effusion noted. Electronically Signed   By: Garald Balding M.D.   On: 02/19/2017 22:57   Dg Swallowing Func-speech Pathology  Result Date: 03/02/2017 Objective Swallowing Evaluation: Type of Study: MBS-Modified Barium Swallow Study  Patient Details Name: [name] MRN: [id number] Date of Birth: [birth year] Today's Date: 03/02/2017 Time: SLP Start Time (ACUTE ONLY): 1035 -SLP Stop Time (ACUTE ONLY): 1100 SLP Time Calculation (min) (ACUTE ONLY): 25 min Past Medical History: Past Medical History: Diagnosis Date . Acute MI ([place])   x4, code blue x3 . Arteriosclerotic cardiovascular disease (ASCVD) 2002  Inf STEMI-2002. 2003-cutting balloon + brachytherapy for restenosis; subsequent acute stent thrombosis 06/2010 requiring 2 separate interventions (Zeta stent, then repeat cath with thrombectomy). focal basal inf AK, nl EF; 03/2011: Patent stents, minor nonobst  residual dz, nl EF; neg stress nuclear in 2008 and stress echo in 2009 . Chronic anticoagulation   Warfarin plus ticagrelor . Chronic respiratory failure ([place]) 08/27/2013  On 2L 02 . COPD (chronic  obstructive pulmonary  disease) ([place])   02 dependent . Factor V Leiden, prothrombin gene mutation ([place]) 2006 . Hyperlipidemia  . Hypothyroidism  . Noncompliance  . Pelvic fracture ([place]) 2009 . Pulmonary embolism ([place]) 2006  Associated with deep vein thrombosis-2006; + factor V Leiden . Tobacco abuse   50 pack years Past Surgical History: Past Surgical History: Procedure Laterality Date . COLONOSCOPY  Approximately 2000  Negative screening study . CORONARY ANGIOPLASTY  2002, 2003, 2012 . LEFT AND RIGHT HEART CATHETERIZATION WITH CORONARY ANGIOGRAM N/A 04/03/2011  Procedure: LEFT AND RIGHT HEART CATHETERIZATION WITH CORONARY ANGIOGRAM;  Surgeon: Sherren Mocha, MD;  Location[facility] CATH LAB;  Service: Cardiovascular;  Laterality: N/A; HPI: [name]  is a 66 y.o. female, w hx of CAD, Hypothyroidism, PE, Factor V leiden, Copd on home o2, apparently c/o increase in dyspnea for 24 hours prior to admission.  Slight cough, dry-yellow sputum.  Denies fever, chills, cp, palp, n/v, diarrhea, brbpr, black stool.  Pt presents to ED due to increase in dyspnea.  Subjective: "I want a Sprite." Assessment / Plan / Recommendation CHL IP CLINICAL IMPRESSIONS 03/02/2017 Clinical Impression Pt presents with min oropharyngeal phase dysphagia characterized by weak lingual manipulation of solids and impaired mastication due to edentulous status resulting in piecemeal deglutition and delayed oral prep with solids, min delay in swallow initiation with swallow trigger after filling the valleculae and spilling to the pyriforms with cup/straw sips thin resulting in variable flash penetration with thins without aspiration. Pt with spontaneous cough with one episode of deeper penetration and it was immediately removed (this also occurred when taking barium tablet). Pt noted to cough at bedside during po trials and suspect this due to element of possible penetration and/or respiratory compromise (COPD). Recommend D3/mech soft with thin liquids  with standard aspiration and reflux precautions; po only when Pt is alert and upright. SLP will continue to follow. Recommend PT evaluation to help determine needs post acute. SLP Visit Diagnosis Dysphagia, oropharyngeal phase (R13.12) Attention and concentration deficit following -- Frontal lobe and executive function deficit following -- Impact on safety and function Mild aspiration risk   CHL IP TREATMENT RECOMMENDATION 03/02/2017 Treatment Recommendations Therapy as outlined in treatment plan below   Prognosis 03/02/2017 Prognosis for Safe Diet Advancement Good Barriers to Reach Goals -- Barriers/Prognosis Comment -- CHL IP DIET RECOMMENDATION 03/02/2017 SLP Diet Recommendations Dysphagia 3 (Mech soft) solids;Thin liquid Liquid Administration via Cup;Straw Medication Administration Whole meds with liquid Compensations Slow rate;Small sips/bites;Multiple dry swallows after each bite/sip Postural Changes Remain semi-upright after after feeds/meals (Comment);Seated upright at 90 degrees   [facility] IP OTHER RECOMMENDATIONS 03/02/2017 Recommended Consults -- Oral Care Recommendations Oral care BID;Staff/trained caregiver to provide oral care Other Recommendations Clarify dietary restrictions   [facility] IP FOLLOW UP RECOMMENDATIONS 03/02/2017 Follow up Recommendations Skilled Nursing facility   [facility] IP FREQUENCY AND DURATION 03/02/2017 Speech Therapy Frequency (ACUTE ONLY) min 2x/week Treatment Duration 1 week      [facility] IP ORAL PHASE 03/02/2017 Oral Phase Impaired Oral - Pudding Teaspoon -- Oral - Pudding Cup -- Oral - Honey Teaspoon -- Oral - Honey Cup -- Oral - Nectar Teaspoon -- Oral - Nectar Cup -- Oral - Nectar Straw -- Oral - Thin Teaspoon -- Oral - Thin Cup -- Oral - Thin Straw -- Oral - Puree -- Oral - Mech Soft Impaired mastication;Lingual/palatal residue;Piecemeal swallowing;Delayed oral transit;Decreased bolus cohesion Oral - Regular -- Oral - Multi-Consistency -- Oral - Pill -- Oral Phase - Comment Pt edentulous  [facility] IP  PHARYNGEAL  PHASE 03/02/2017 Pharyngeal Phase Impaired Pharyngeal- Pudding Teaspoon -- Pharyngeal -- Pharyngeal- Pudding Cup -- Pharyngeal -- Pharyngeal- Honey Teaspoon -- Pharyngeal -- Pharyngeal- Honey Cup -- Pharyngeal -- Pharyngeal- Nectar Teaspoon -- Pharyngeal -- Pharyngeal- Nectar Cup -- Pharyngeal -- Pharyngeal- Nectar Straw -- Pharyngeal -- Pharyngeal- Thin Teaspoon Delayed swallow initiation-vallecula Pharyngeal -- Pharyngeal- Thin Cup Delayed swallow initiation-vallecula;Delayed swallow initiation-pyriform sinuses;Penetration/Aspiration during swallow;Reduced epiglottic inversion Pharyngeal Material does not enter airway;Material enters airway, remains ABOVE vocal cords then ejected out Pharyngeal- Thin Straw Delayed swallow initiation-pyriform sinuses;Reduced epiglottic inversion;Penetration/Aspiration during swallow Pharyngeal Material does not enter airway;Material enters airway, remains ABOVE vocal cords then ejected out Pharyngeal- Puree WFL;Delayed swallow initiation-vallecula Pharyngeal -- Pharyngeal- Mechanical Soft WFL;Delayed swallow initiation-vallecula;Pharyngeal residue - valleculae Pharyngeal -- Pharyngeal- Regular -- Pharyngeal -- Pharyngeal- Multi-consistency -- Pharyngeal -- Pharyngeal- Pill Penetration/Aspiration during swallow Pharyngeal Material enters airway, remains ABOVE vocal cords then ejected out Pharyngeal Comment (No Data)  [facility] IP CERVICAL ESOPHAGEAL PHASE 03/02/2017 Cervical Esophageal Phase WFL Pudding Teaspoon -- Pudding Cup -- Honey Teaspoon -- Honey Cup -- Nectar Teaspoon -- Nectar Cup -- Nectar Straw -- Thin Teaspoon -- Thin Cup -- Thin Straw -- Puree -- Mechanical Soft -- Regular -- Multi-consistency -- Pill -- Cervical Esophageal Comment -- No flowsheet data found. Thank you, Genene Churn, [place] 03/02/2017, 2:48 PM                Subjective: Pt says that she feels much better. No SOB. She is eating and drinking well. She remains weak but  refusing sNF.  She refusing to go stay with daughter in [place].    Discharge Exam: Vitals:   03/05/17 0825 03/05/17 1434  BP:    Pulse:    Resp:    Temp:    SpO2: 98% 95%   Vitals:   03/04/17 2236 03/05/17 0518 03/05/17 0825 03/05/17 1434  BP: (!) 138/59 (!) 116/46    Pulse: 81 80    Resp: 19 18    Temp: 98.6 F (37 C)     TempSrc: Oral     SpO2: 99% 98% 98% 95%  Weight:  70.8 kg (156 lb 1.4 oz)    Height:        General exam: Alert, awake, oriented x 3 Respiratory system: BBS CTA. No increased WOB.  Respiratory effort normal. Cardiovascular system: normal s1, s2.  No murmurs, rubs, gallops. Gastrointestinal system: Abdomen is nondistended, soft and nontender. No organomegaly or masses felt. Normal bowel sounds heard. Central nervous system: Alert and oriented. No focal neurological deficits. Extremities: No C/C/E, +pedal pulses Skin: No rashes, lesions or ulcers Psychiatry: Judgement and insight appear normal. Mood & affect appropriate.    The results of significant diagnostics from this hospitalization (including imaging, microbiology, ancillary and laboratory) are listed below for reference.    Microbiology: No results found for this or any previous visit (from the past 240 hour(s)).   Labs: BNP (last 3 results) Recent Labs    02/19/17 1540  BNP 914.7*   Basic Metabolic Panel: Recent Labs  Lab 02/27/17 0416 02/28/17 0626 03/01/17 0534 03/02/17 0432 03/03/17 0459  NA 143 139 141 142 139  K 3.7 4.3 3.9 4.3 4.5  CL 96* 97* 99* 102 100*  CO2 36* 34* 32 31 31  GLUCOSE 116* 117* 95 121* 126*  BUN 30* 28* 30* 29* 26*  CREATININE 0.81 0.74 0.72 0.68 0.66  CALCIUM 8.3* 8.3* 8.5* 8.2* 8.2*   Liver Function Tests: Recent Labs  Lab 02/27/17 0416  02/28/17 0626 03/01/17 0534 03/02/17 0432 03/03/17 0459  AST 80* 44* 35 50* 42*  ALT 178* 123* 105* 104* 105*  ALKPHOS 69 56 60 58 57  BILITOT 1.0 1.0 0.9 1.0 0.5  PROT 6.1* 5.5* 6.2* 5.7* 5.5*  ALBUMIN  2.7* 2.5* 2.8* 2.6* 2.5*   No results for input(s): LIPASE, AMYLASE in the last 168 hours. No results for input(s): AMMONIA in the last 168 hours. CBC: Recent Labs  Lab 02/27/17 0416 02/28/17 0626 03/01/17 0534 03/02/17 0432 03/03/17 0459  WBC 12.7* 8.0 13.3* 8.6 15.8*  NEUTROABS 10.8* 6.4 10.8* 7.2 14.3*  HGB 12.0 11.9* 12.6 12.5 12.8  HCT 38.6 37.6 40.3 40.2 41.4  MCV 92.1 90.8 90.4 90.3 91.6  PLT 214 191 247 216 242   Cardiac Enzymes: No results for input(s): CKTOTAL, CKMB, CKMBINDEX, TROPONINI in the last 168 hours. BNP: Invalid input(s): POCBNP CBG: Recent Labs  Lab 03/03/17 1635 03/03/17 2007 03/04/17 0055 03/04/17 0415 03/04/17 0746  GLUCAP 128* 164* 137* 118* 105*   D-Dimer No results for input(s): DDIMER in the last 72 hours. Hgb A1c No results for input(s): HGBA1C in the last 72 hours. Lipid Profile No results for input(s): CHOL, HDL, LDLCALC, TRIG, CHOLHDL, LDLDIRECT in the last 72 hours. Thyroid function studies No results for input(s): TSH, T4TOTAL, T3FREE, THYROIDAB in the last 72 hours.  Invalid input(s): FREET3 Anemia work up No results for input(s): VITAMINB12, FOLATE, FERRITIN, TIBC, IRON, RETICCTPCT in the last 72 hours. Urinalysis    Component Value Date/Time   COLORURINE YELLOW 02/20/2017 0000   APPEARANCEUR HAZY (A) 02/20/2017 0000   LABSPEC >1.030 (H) 02/20/2017 0000   PHURINE 6.0 02/20/2017 0000   GLUCOSEU NEGATIVE 02/20/2017 0000   HGBUR LARGE (A) 02/20/2017 0000   BILIRUBINUR NEGATIVE 02/20/2017 0000   BILIRUBINUR n 09/21/2016 1432   KETONESUR NEGATIVE 02/20/2017 0000   PROTEINUR 100 (A) 02/20/2017 0000   UROBILINOGEN 0.2 09/21/2016 1432   UROBILINOGEN 0.2 04/13/2009 2025   NITRITE NEGATIVE 02/20/2017 0000   LEUKOCYTESUR NEGATIVE 02/20/2017 0000   Sepsis Labs Invalid input(s): PROCALCITONIN,  WBC,  LACTICIDVEN Microbiology No results found for this or any previous visit (from the past 240 hour(s)).  Time coordinating  discharge: 40 minutes  SIGNED:  Irwin Brakeman, MD  [facility] 03/05/2017, 3:22 PM Pager [phone]  If 7PM-7AM, please contact night-coverage [email or web address] Password [email or web address]

## 2017-03-05 NOTE — Progress Notes (Signed)
Discharge instructions read to patient and her family.  Instructions included Lovenox injection instructions Pt states that she has had to give herself this medication in the past and verbalized understanding of all instructions Discharged to home with family.

## 2017-03-05 NOTE — Discharge Instructions (Signed)
Please go to [facility] in 4-5 days to have your PT/INR tested and warfarin doses adjusted. Please continue lovenox injections until you have been told to stop them after your PT/INR becomes therapeutic.  Stop lovenox for any bleeding episodes.   STOP LOVENOX INJECTIONS WHEN PT/INR THERAPEUTIC (2-3) OR IF YOU HAVE ANY NOTICEABLE BLEEDING EVENTS.   PLEASE SEE YOUR PRIMARY CARE PROVIDER IN NEXT WEEK.  GO TO COUMADIN CLINIC BY THE END OF THIS WEEK TO HAVE YOUR PT/INR TESTED AND WARFARIN DOSES ADJUSTED IF NEEDED.  HOME HEALTH NURSE TO DRAW PT/INR IN 2 DAYS (03/07/17) AND REPORT RESULTS TO PCP AND COUMADIN CLINIC.     Follow with Primary MD  [name], NP  and other consultants as instructed your Hospitalist MD  Please get a complete blood count and chemistry panel checked by your Primary MD at your next visit, and again as instructed by your Primary MD.  Get Medicines reviewed and adjusted: Please take all your medications with you for your next visit with your Primary MD  Laboratory/radiological data: Please request your Primary MD to go over all hospital tests and procedure/radiological results at the follow up, please ask your Primary MD to get all Hospital records sent to his/her office.  In some cases, they will be blood work, cultures and biopsy results pending at the time of your discharge. Please request that your primary care M.D. follows up on these results.  Also Note the following: If you experience worsening of your admission symptoms, develop shortness of breath, life threatening emergency, suicidal or homicidal thoughts you must seek medical attention immediately by calling 911 or calling your MD immediately  if symptoms less severe.  You must read complete instructions/literature along with all the possible adverse reactions/side effects for all the Medicines you take and that have been prescribed to you. Take any new Medicines after you have completely  understood and accpet all the possible adverse reactions/side effects.   Do not drive when taking Pain medications or sleeping medications (Benzodaizepines)  Do not take more than prescribed Pain, Sleep and Anxiety Medications. It is not advisable to combine anxiety,sleep and pain medications without talking with your primary care practitioner  Special Instructions: If you have smoked or chewed Tobacco  in the last 2 yrs please stop smoking, stop any regular Alcohol  and or any Recreational drug use.  Wear Seat belts while driving.  Please note: You were cared for by a hospitalist during your hospital stay. Once you are discharged, your primary care physician will handle any further medical issues. Please note that NO REFILLS for any discharge medications will be authorized once you are discharged, as it is imperative that you return to your primary care physician (or establish a relationship with a primary care physician if you do not have one) for your post hospital discharge needs so that they can reassess your need for medications and monitor your lab values.   Acute Respiratory Distress Syndrome, Adult Acute respiratory distress syndrome is a life-threatening condition in which fluid collects in the lungs. This prevents the lungs from filling with air and passing oxygen into the blood. This can cause the lungs and other vital organs to fail. The condition usually develops following an infection, illness, surgery, or injury. What are the causes? This condition may be caused by:  An infection, such as sepsis or pneumonia.  A serious injury to the head or chest.  Severe bleeding from an injury.  A major surgery.  Breathing in harmful chemicals or smoke.  Blood transfusions.  A blood clot in the lungs.  Breathing in vomit (aspiration).  Near-drowning.  Inflammation of the pancreas (pancreatitis).  A drug overdose.  What are the signs or symptoms? Sudden shortness of breath  and rapid breathing are the main symptoms of this condition. Other symptoms may include:  A fast or irregular heartbeat.  Skin, lips, or fingernails that look blue (cyanosis).  Confusion.  Tiredness or loss of energy.  Chest pain, particularly while taking a breath.  Coughing.  Restlessness or anxiety.  Fever. This is usually present if there is an underlying infection, such as pneumonia.  How is this diagnosed? This condition is diagnosed based on:  Your symptoms.  Medical history.  A physical exam. During the exam, your health care provider will listen to your heart and check for crackling or wheezing sounds in your lungs.  You may also have other tests to confirm the diagnosis and measure how well your lungs are working. These may include:  Measuring the amount of oxygen in your blood. Your health care provider will use two methods to do this procedure: ? A small device (pulse oximeter) that is placed on your finger, earlobe, or toe. ? An arterial blood gas test. A sample of blood is taken from an artery and tested for oxygen levels.  Blood tests.  Chest X-rays or CT scans to look for fluid in the lungs.  Taking a sample of your sputum to test for infection.  Heart test, such as an echocardiogram or electrocardiogram. This is done to rule out any heart problems (such as heart failure) that may be causing your symptoms.  Bronchoscopy. During this test, a thin, flexible tube with a light is passed into the mouth or nose, down the windpipe, and into the lungs.  How is this treated? Treatment depends on the cause of your condition. The goal is to support you while your lungs heal and the underlying cause is treated. Treatment may include:  Oxygen therapy. This may be done through: ? A tube in your nose or a face mask. ? A ventilator. This device helps move air into and out of your lungs through a breathing tube that is inserted into your mouth or nose.  Continuous  positive airway pressure (CPAP). This treatment uses mild air pressure to keep the airways open. A mask or other device will be placed over your nose or mouth.  Tracheostomy. During this procedure, a small cut is made in your neck to create an opening to your windpipe. A breathing tube is placed directly into your windpipe. The breathing tube is connected to a ventilator. This is done if you have problems with your airway or if you need a ventilator for a long period of time.  Positioning you to lie on your stomach (prone position).  Medicines, such as: ? Sedatives to help you relax. ? Blood pressure medicines. ? Antibiotics to treat infection. ? Blood thinners to prevent blood clots. ? Diuretics to help prevent excess fluid.  Fluids and nutrients given through an IV tube.  Wearing compression stockings on your legs to prevent blood clots.  Extra corporeal membrane oxygenation (ECMO). This treatment takes blood outside your body, adds oxygen, and removes carbon dioxide. The blood is then returned to your body. This treatment is only used in severe cases.  Follow these instructions at home:  Take over-the-counter and prescription medicines only as told by your health care provider.  Do not  use any products that contain nicotine or tobacco, such as cigarettes and e-cigarettes. If you need help quitting, ask your health care provider.  Limit alcohol intake to no more than 1 drink per day for nonpregnant women and 2 drinks per day for men. One drink equals 12 oz of beer, 5 oz of wine, or 1 oz of hard liquor.  Ask friends and family to help you if daily activities make you tired.  Attend any pulmonary rehabilitation as told by your health care provider. This may include: ? Education about your condition. ? Exercises. ? Breathing training. ? Counseling. ? Learning techniques to conserve energy. ? Nutrition counseling.  Keep all follow-up visits as told by your health care provider.  This is important. Contact a health care provider if:  You become short of breath during activity or while resting.  You develop a cough that does not go away.  You have a fever.  Your symptoms do not get better or they get worse.  You become anxious or depressed. Get help right away if:  You have sudden shortness of breath.  You develop sudden chest pain that does not go away.  You develop a rapid heart rate.  You develop swelling or pain in one of your legs.  You cough up blood.  You have trouble breathing.  Your skin, lips, or fingernails turn blue. These symptoms may represent a serious problem that is an emergency. Do not wait to see if the symptoms will go away. Get medical help right away. Call your local emergency services (911 in the [place] Do not drive yourself to the hospital. Summary  Acute respiratory distress syndrome is a life-threatening condition in which fluid collects in the lungs, which leads the lungs and other vital organs to fail.  This condition usually develops following an infection, illness, surgery, or injury.  Sudden shortness of breath and rapid breathing are the main symptoms of acute respiratory distress syndrome.  Treatment may include oxygen therapy, continuous positive airway pressure (CPAP), tracheostomy, lying on your stomach (prone position), medicines, fluids and nutrients given through an IV tube, compression stockings, and extra corporeal membrane oxygenation (ECMO). This information is not intended to replace advice given to you by your health care provider. Make sure you discuss any questions you have with your health care provider. Document Released: 12/26/2004 Document Revised: 12/13/2015 Document Reviewed: 12/13/2015 Elsevier Interactive Patient Education  2017 [employer].   Acute Respiratory Failure, Adult Acute respiratory failure occurs when there is not enough oxygen passing from your lungs to your body. When this  happens, your lungs have trouble removing carbon dioxide from the blood. This causes your blood oxygen level to drop too low as carbon dioxide builds up. Acute respiratory failure is a medical emergency. It can develop quickly, but it is temporary if treated promptly. Your lung capacity, or how much air your lungs can hold, may improve with time, exercise, and treatment. What are the causes? There are many possible causes of acute respiratory failure, including:  Lung injury.  Chest injury or damage to the ribs or tissues near the lungs.  Lung conditions that affect the flow of air and blood into and out of the lungs, such as pneumonia, acute respiratory distress syndrome, and cystic fibrosis.  Medical conditions, such as strokes or spinal cord injuries, that affect the muscles and nerves that control breathing.  Blood infection (sepsis).  Inflammation of the pancreas (pancreatitis).  A blood clot in the lungs (pulmonary embolism).  A large-volume blood transfusion.  Burns.  Near-drowning.  Seizure.  Smoke inhalation.  Reaction to medicines.  Alcohol or drug overdose.  What increases the risk? This condition is more likely to develop in people who have:  A blocked airway.  Asthma.  A condition or disease that damages or weakens the muscles, nerves, bones, or tissues that are involved in breathing.  A serious infection.  A health problem that blocks the unconscious reflex that is involved in breathing, such as hypothyroidism or sleep apnea.  A lung injury or trauma.  What are the signs or symptoms? Trouble breathing is the main symptom of acute respiratory failure. Symptoms may also include:  Rapid breathing.  Restlessness or anxiety.  Skin, lips, or fingernails that appear blue (cyanosis).  Rapid heart rate.  Abnormal heart rhythms (arrhythmias).  Confusion or changes in behavior.  Tiredness or loss of energy.  Feeling sleepy or having a loss of  consciousness.  How is this diagnosed? Your health care provider can diagnose acute respiratory failure with a medical history and physical exam. During the exam, your health care provider will listen to your heart and check for crackling or wheezing sounds in your lungs. Your may also have tests to confirm the diagnosis and determine what is causing respiratory failure. These tests may include:  Measuring the amount of oxygen in your blood (pulse oximetry). The measurement comes from a small device that is placed on your finger, earlobe, or toe.  Other blood tests to measure blood gases and to look for signs of infection.  Sampling your cerebral spinal fluid or tracheal fluid to check for infections.  Chest X-ray to look for fluid in spaces that should be filled with air.  Electrocardiogram (ECG) to look at the heart's electrical activity.  How is this treated? Treatment for this condition usually takes places in a hospital intensive care unit (ICU). Treatment depends on what is causing the condition. It may include one or more treatments until your symptoms improve. Treatment may include:  Supplemental oxygen. Extra oxygen is given through a tube in the nose, a face mask, or a hood.  A device such as a continuous positive airway pressure (CPAP) or bi-level positive airway pressure (BiPAP or BPAP) machine. This treatment uses mild air pressure to keep the airways open. A mask or other device will be placed over your nose or mouth. A tube that is connected to a motor will deliver oxygen through the mask.  Ventilator. This treatment helps move air into and out of the lungs. This may be done with a bag and mask or a machine. For this treatment, a tube is placed in your windpipe (trachea) so air and oxygen can flow to the lungs.  Extracorporeal membrane oxygenation (ECMO). This treatment temporarily takes over the function of the heart and lungs, supplying oxygen and removing carbon dioxide.  ECMO gives the lungs a chance to recover. It may be used if a ventilator is not effective.  Tracheostomy. This is a procedure that creates a hole in the neck to insert a breathing tube.  Receiving fluids and medicines.  Rocking the bed to help breathing.  Follow these instructions at home:  Take over-the-counter and prescription medicines only as told by your health care provider.  Return to normal activities as told by your health care provider. Ask your health care provider what activities are safe for you.  Keep all follow-up visits as told by your health care provider. This is important. How is  this prevented? Treating infections and medical conditions that may lead to acute respiratory failure can help prevent the condition from developing. Contact a health care provider if:  You have a fever.  Your symptoms do not improve or they get worse. Get help right away if:  You are having trouble breathing.  You lose consciousness.  Your have cyanosis or turn blue.  You develop a rapid heart rate.  You are confused. These symptoms may represent a serious problem that is an emergency. Do not wait to see if the symptoms will go away. Get medical help right away. Call your local emergency services (911 in the [place] Do not drive yourself to the hospital. This information is not intended to replace advice given to you by your health care provider. Make sure you discuss any questions you have with your health care provider. Document Released: 12/31/2012 Document Revised: 07/24/2015 Document Reviewed: 07/14/2015 Elsevier Interactive Patient Education  2018 [employer].   Chronic Obstructive Pulmonary Disease Exacerbation Chronic obstructive pulmonary disease (COPD) is a long-term (chronic) condition that affects the lungs. COPD is a general term that can be used to describe many different lung problems that cause lung swelling (inflammation) and limit airflow, including chronic  bronchitis and emphysema. COPD exacerbations are episodes when breathing symptoms become much worse and require extra treatment. COPD exacerbations are usually caused by infections. Without treatment, COPD exacerbations can be severe and even life threatening. Frequent COPD exacerbations can cause further damage to the lungs. What are the causes? This condition may be caused by:  Respiratory infections, including viral and bacterial infections.  Exposure to smoke.  Exposure to air pollution, chemical fumes, or dust.  Things that give you an allergic reaction (allergens).  Not taking your usual COPD medicines as directed.  Underlying medical problems, such as congestive heart failure or infections not involving the lungs.  In many cases, the cause (trigger) of this condition is not known. What increases the risk? The following factors may make you more likely to develop this condition:  Smoking cigarettes.  Old age.  Frequent prior COPD exacerbations.  What are the signs or symptoms? Symptoms of this condition include:  Increased coughing.  Increased production of mucus from your lungs (sputum).  Increased wheezing.  Increased shortness of breath.  Rapid or labored breathing.  Chest tightness.  Less energy than usual.  Sleep disruption from symptoms.  Confusion or increased sleepiness.  Often these symptoms happen or get worse even with the use of medicines. How is this diagnosed? This condition is diagnosed based on:  Your medical history.  A physical exam.  You may also have tests, including:  A chest X-ray.  Blood tests.  Lung (pulmonary) function tests.  How is this treated? Treatment for this condition depends on the severity and cause of the symptoms. You may need to be admitted to a hospital for treatment. Some of the treatments commonly used to treat COPD exacerbations are:  Antibiotic medicines. These may be used for severe exacerbations  caused by a lung infection, such as pneumonia.  Bronchodilators. These are inhaled medicines that expand the air passages and allow increased airflow.  Steroid medicines. These act to reduce inflammation in the airways. They may be given with an inhaler, taken by mouth, or given through an IV tube inserted into one of your veins.  Supplemental oxygen therapy.  Airway clearing techniques, such as noninvasive ventilation (NIV) and positive expiratory pressure (PEP). These provide respiratory support through a mask or other  noninvasive device. An example of this would be using a continuous positive airway pressure (CPAP) machine to improve delivery of oxygen into your lungs.  Follow these instructions at home: Medicines  Take over-the-counter and prescription medicines only as told by your health care provider. It is important to use correct technique with inhaled medicines.  If you were prescribed an antibiotic medicine or oral steroid, take it as told by your health care provider. Do not stop taking the medicine even if you start to feel better. Lifestyle  Eat a healthy diet.  Exercise regularly.  Get plenty of sleep.  Avoid exposure to all substances that irritate the airway, especially to tobacco smoke.  Wash your hands often with soap and water to reduce the risk of infection. If soap and water are not available, use hand sanitizer.  During flu season, avoid enclosed spaces that are crowded with people. General instructions  Drink enough fluid to keep your urine clear or pale yellow (unless you have a medical condition that requires fluid restriction).  Use a cool mist vaporizer. This humidifies the air and makes it easier for you to clear your chest when you cough.  If you have a home nebulizer and oxygen, continue to use them as told by your health care provider.  Keep all follow-up visits as told by your health care provider. This is important. How is this  prevented?  Stay up-to-date on pneumococcal and influenza (flu) vaccines. A flu shot is recommended every year to help prevent exacerbations.  Do not use any products that contain nicotine or tobacco, such as cigarettes and e-cigarettes. Quitting smoking is very important in preventing COPD from getting worse and in preventing exacerbations from happening as often. If you need help quitting, ask your health care provider.  Follow all instructions for pulmonary rehabilitation after a recent exacerbation. This can help prevent future exacerbations.  Work with your health care provider to develop and follow an action plan. This tells you what steps to take when you experience certain symptoms. Contact a health care provider if:  You have a worsening of your regular COPD symptoms. Get help right away if:  You have worsening shortness of breath, even when resting.  You have trouble talking.  You have severe chest pain.  You cough up blood.  You have a fever.  You have weakness, vomit repeatedly, or faint.  You feel confused.  You are not able to sleep because of your symptoms.  You have trouble doing daily activities. Summary  COPD exacerbations are episodes when breathing symptoms become much worse and require extra treatment above your normal treatment.  Exacerbations can be severe and even life threatening. Frequent COPD exacerbations can cause further damage to your lungs.  COPD exacerbations are usually triggered by infections such as the flu, colds, and even pneumonia.  Treatment for this condition depends on the severity and cause of the symptoms. You may need to be admitted to a hospital for treatment.  Quitting smoking is very important to prevent COPD from getting worse and to prevent exacerbations from happening as often. This information is not intended to replace advice given to you by your health care provider. Make sure you discuss any questions you have with your  health care provider. Document Released: 10/23/2006 Document Revised: 01/31/2016 Document Reviewed: 01/31/2016 Elsevier Interactive Patient Education  2018 [place].   Cough, Adult Coughing is a reflex that clears your throat and your airways. Coughing helps to heal and protect your lungs.  It is normal to cough occasionally, but a cough that happens with other symptoms or lasts a long time may be a sign of a condition that needs treatment. A cough may last only 2-3 weeks (acute), or it may last longer than 8 weeks (chronic). What are the causes? Coughing is commonly caused by:  Breathing in substances that irritate your lungs.  A viral or bacterial respiratory infection.  Allergies.  Asthma.  Postnasal drip.  Smoking.  Acid backing up from the stomach into the esophagus (gastroesophageal reflux).  Certain medicines.  Chronic lung problems, including COPD (or rarely, lung cancer).  Other medical conditions such as heart failure.  Follow these instructions at home: Pay attention to any changes in your symptoms. Take these actions to help with your discomfort:  Take medicines only as told by your health care provider. ? If you were prescribed an antibiotic medicine, take it as told by your health care provider. Do not stop taking the antibiotic even if you start to feel better. ? Talk with your health care provider before you take a cough suppressant medicine.  Drink enough fluid to keep your urine clear or pale yellow.  If the air is dry, use a cold steam vaporizer or humidifier in your bedroom or your home to help loosen secretions.  Avoid anything that causes you to cough at work or at home.  If your cough is worse at night, try sleeping in a semi-upright position.  Avoid cigarette smoke. If you smoke, quit smoking. If you need help quitting, ask your health care provider.  Avoid caffeine.  Avoid alcohol.  Rest as needed.  Contact a health care provider  if:  You have new symptoms.  You cough up pus.  Your cough does not get better after 2-3 weeks, or your cough gets worse.  You cannot control your cough with suppressant medicines and you are losing sleep.  You develop pain that is getting worse or pain that is not controlled with pain medicines.  You have a fever.  You have unexplained weight loss.  You have night sweats. Get help right away if:  You cough up blood.  You have difficulty breathing.  Your heartbeat is very fast. This information is not intended to replace advice given to you by your health care provider. Make sure you discuss any questions you have with your health care provider. Document Released: 06/24/2010 Document Revised: 06/03/2015 Document Reviewed: 03/04/2014 Elsevier Interactive Patient Education  [employer].

## 2017-03-05 NOTE — Progress Notes (Signed)
[place] for Warfarin >> LOVENOX >> resume Warfarin 2/22 Indication: Chronic warfarin, Hx PE/DVT/+ factor V Leiden  Allergies  Allergen Reactions  . Dilaudid [Hydromorphone Hcl] Hives and Nausea Only  . Minocycline Hcl     REACTION: Dizzy  . Prednisone     REACTION: feels like throat swelling  . Varenicline Tartrate     REACTION: Dizzy(chantix)   . Zocor [Simvastatin - High Dose] Other (See Comments)    myalgia   Patient Measurements: Height: 5\' 5"  (165.1 cm) Weight: 156 lb 1.4 oz (70.8 kg) IBW/kg (Calculated) : 57  Vital Signs: Temp: 98.6 F (37 C) (02/24 2236) Temp Source: Oral (02/24 2236) BP: 116/46 (02/25 0518) Pulse Rate: 80 (02/25 0518)  Labs: Recent Labs    03/03/17 0459 03/04/17 0357 03/05/17 0520  HGB 12.8  --   --   HCT 41.4  --   --   PLT 242  --   --   LABPROT 14.2 15.8* 15.5*  INR 1.11 1.28 1.24  CREATININE 0.66  --   --    Estimated Creatinine Clearance: 68.3 mL/min (by C-G formula based on SCr of 0.66 mg/dL).  Medical History: Past Medical History:  Diagnosis Date  . Acute MI ([place])    x4, code blue x3  . Arteriosclerotic cardiovascular disease (ASCVD) 2002   Inf STEMI-2002. 2003-cutting balloon + brachytherapy for restenosis; subsequent acute stent thrombosis 06/2010 requiring 2 separate interventions (Zeta stent, then repeat cath with thrombectomy). focal basal inf AK, nl EF; 03/2011: Patent stents, minor nonobst  residual dz, nl EF; neg stress nuclear in 2008 and stress echo in 2009  . Chronic anticoagulation    Warfarin plus ticagrelor  . Chronic respiratory failure ([place]) 08/27/2013   On 2L 02  . COPD (chronic obstructive pulmonary disease) ([place])    02 dependent  . Factor V Leiden, prothrombin gene mutation ([place]) 2006  . Hyperlipidemia   . Hypothyroidism   . Noncompliance   . Pelvic fracture ([place]) 2009  . Pulmonary embolism ([place]) 2006   Associated with deep vein thrombosis-2006; + factor V Leiden   . Tobacco abuse    50 pack years   Medications:  Medications Prior to Admission  Medication Sig Dispense Refill Last Dose  . albuterol (PROVENTIL) (2.5 MG/3ML) 0.083% nebulizer solution USE 1 VIAL BY NEBULIZER EVERY 4 HOURS AS NEEDED FOR WHEEZING. DX: J44.9 375 vial 0 Past Month at Unknown time  . budesonide (PULMICORT) 0.5 MG/2ML nebulizer solution Take 2 mLs (0.5 mg total) by nebulization 2 (two) times daily. Dx: J43.9 120 mL 3 Past Month at Unknown time  . fentaNYL (DURAGESIC - DOSED MCG/HR) 50 MCG/HR Place 50 mcg onto the skin every 3 (three) days.   Past Month at Unknown time  . fish oil-omega-3 fatty acids 1000 MG capsule Take 2 g by mouth daily.     Past Month at Unknown time  . ipratropium-albuterol (DUONEB) 0.5-2.5 (3) MG/3ML SOLN 1 vial via nebulizer at breakfast, lunch, dinner and bedtime daily. May take 2 additional treatments on bad day Dx: J43.9 360 mL 3 Past Month at Unknown time  . levothyroxine (SYNTHROID, LEVOTHROID) 125 MCG tablet take 1 tablet by mouth once daily 90 tablet 2 Past Month at Unknown time  . OXYGEN Inhale 2 L into the lungs continuous.   Past Month at Unknown time  . warfarin (COUMADIN) 1 MG tablet Take 1 tablet (1mg ) on Tuesdays and Saturdays along with a 5mg  tablet (Patient taking differently:  Take 1 mg by mouth 2 (two) times a week. Take 1 tablet (1mg ) on Tuesdays and Fridays along with a 5mg  tablet) 30 tablet 4 Past Month at Unknown time  . warfarin (COUMADIN) 5 MG tablet Take 1 tablet (5 mg total) by mouth daily. (Patient taking differently: Take 5-6 mg by mouth daily. 6mg  on Saturdays, 5mg  on Sunday, Monday, 6mg  on Tuesdays, 5mg  on Wednesdays, 6mg  on Thursdays, and 5mg  on Fridays.) 90 tablet 3 Past Month at Unknown time  . nitroGLYCERIN (NITROSTAT) 0.4 MG SL tablet Place 1 tablet (0.4 mg total) under the tongue every 5 (five) minutes as needed for chest pain. 25 tablet 3 unknown  . Respiratory Therapy Supplies (FLUTTER) DEVI Use as directed 1 each 0 Taking     Home warfarin 5mg  daily except 6mg  Tuesday, Thursday and Saturdays per recent ambulatory anticoag flow sheet.    Assessment: Pt in ICU now extubated but requiring bipap and potentially may require reintubation.   Was transitioned to lovenox for anticoagulation full dose.  Now asked to place back on Warfarin, INR remains low, sluggish response.   Goal of Therapy:  INR 2-3   Plan:  Continue Lovenox 1mg /Kg SQ q12hrs until INR at goal Coumadin 7.5mg  today x 1 Monitor for CBC, signs and symptoms of bleeding.   [place] Crane, Nysa Sarin A 03/05/2017,9:27 AM

## 2017-03-05 NOTE — Care Management Note (Addendum)
Case Management Note  Patient Details  Name: [name] MRN: [id number] Date of Birth: May 23, 1950  Subjective/Objective:       Admitted with COPD exacerbation. Pt is from home, lives alone. Has friend who is a Administrator and is out of town often. Pt has daughter who lives in [place]. Pt says she will not be going to stay with her daguther. She has home O2 and neb pta. She has cane and RW pta. She meets criteria for the COPD pilot and is agreeable to participating.              Action/Plan: DC home today with HH through [facility] and COPD program. Blake Divine, [facility] rep, aware of referral and will pull pt info from chart. Pt aware HH has 48 hrs to make first visit.   Expected Discharge Date:  03/02/17               Expected Discharge Plan:  [place]  In-House Referral:  NA  Discharge planning Services  CM Consult  Post Acute Care Choice:  Home Health Choice offered to:  Patient  HH Arranged:  RN, PT, Respirator Therapy [place] Agency:  [place]  Status of Service:  Completed, signed off   Addendum: PT has recommended SNF. Pt refuses SNF at this time. Per PT pt mentions the option of going to stay with her daughter. CM asked pt is she may be going to stay in [place] and pt very clearly states she will NOT be going anywhere.   Sherald Barge, RN 03/05/2017, 1:37 PM

## 2017-03-05 NOTE — Plan of Care (Signed)
  Acute Rehab PT Goals(only PT should resolve) Pt Will Go Supine/Side To Sit 03/05/2017 1426 - Progressing by Lonell Grandchild, PT Flowsheets Taken 03/05/2017 1426  Pt will go Supine/Side to Sit with modified independence Patient Will Transfer Sit To/From Stand 03/05/2017 1426 - Progressing by Lonell Grandchild, PT Flowsheets Taken 03/05/2017 1426  Patient will transfer sit to/from stand with supervision Pt Will Transfer Bed To Chair/Chair To Bed 03/05/2017 1426 - Progressing by Lonell Grandchild, PT Flowsheets Taken 03/05/2017 1426  Pt will Transfer Bed to Chair/Chair to Bed with supervision Pt Will Ambulate 03/05/2017 1426 - Progressing by Lonell Grandchild, PT Flowsheets Taken 03/05/2017 1426  Pt will Ambulate with supervision;with rolling walker;with cane;50 feet  2:27 PM, 03/05/17 Lonell Grandchild, MPT Physical Therapist with [facility] [phone] office [phone] mobile phone

## 2017-03-05 NOTE — Evaluation (Signed)
Physical Therapy Evaluation Patient Details Name: [name] MRN: [id number] DOB: March 19, 1950 Today's Date: 03/05/2017   History of Present Illness  [name]  is a 67 y.o. female, w hx of CAD, Hypothyroidism, PE, Factor V leiden, Copd on home o2, apparently c/o increase in dyspnea since last nite.  Slight cough, dry-yellow sputum.  Denies fever, chills, cp, palp, n/v, diarrhea, brbpr, black stool.  Pt presents to ED due to increase in dyspnea.    Clinical Impression  Patient has most difficulty with sit to stands and had near fall when not using an assistive device, improvement for sit to stands using RW, but required much effort, had no loss of balance when walking, limited for gait secondary to fatigue and mild SOB.  Patient tolerated sitting up in chair after therapy.  Patient will benefit from continued physical therapy in hospital and recommended venue below to increase strength, balance, endurance for safe ADLs and gait.    Follow Up Recommendations SNF;Supervision for mobility/OOB    Equipment Recommendations  None recommended by PT    Recommendations for Other Services       Precautions / Restrictions Precautions Precautions: Fall Restrictions Weight Bearing Restrictions: No      Mobility  Bed Mobility Overal bed mobility: Needs Assistance Bed Mobility: Supine to Sit;Sit to Supine     Supine to sit: Supervision Sit to supine: Supervision      Transfers Overall transfer level: Needs assistance Equipment used: Rolling walker (2 wheeled) Transfers: Sit to/from [facility] Sit to Stand: Min assist Stand pivot transfers: Min guard          Ambulation/Gait Ambulation/Gait assistance: Min guard Ambulation Distance (Feet): 30 Feet Assistive device: Rolling walker (2 wheeled) Gait Pattern/deviations: Decreased step length - right;Decreased step length - left;Decreased stride length   Gait velocity interpretation: Below normal speed for  age/gender General Gait Details: deomonstrates slow labored movement with occasional standing rest breaks on 2 LPM, mostly limited due to SOB and fatigue  Stairs            Wheelchair Mobility    Modified Rankin (Stroke Patients Only)       Balance Overall balance assessment: Needs assistance Sitting-balance support: No upper extremity supported;Feet supported Sitting balance-Leahy Scale: Good     Standing balance support: During functional activity;Bilateral upper extremity supported Standing balance-Leahy Scale: Fair                               Pertinent Vitals/Pain Pain Assessment: No/denies pain    Home Living Family/patient expects to be discharged to:: Private residence Living Arrangements: Alone Available Help at Discharge: Family Type of Home: House Home Access: Stairs to enter Entrance Stairs-Rails: Right;Left;Can reach both Entrance Stairs-Number of Steps: 4 Home Layout: One level Home Equipment: Environmental consultant - 2 wheels;Cane - single point;Shower seat;Bedside commode      Prior Function Level of Independence: Independent with assistive device(s)         Comments: household ambulation without assistive device, uses SPC for community     Hand Dominance        Extremity/Trunk Assessment   Upper Extremity Assessment Upper Extremity Assessment: Generalized weakness    Lower Extremity Assessment Lower Extremity Assessment: Generalized weakness    Cervical / Trunk Assessment Cervical / Trunk Assessment: Normal  Communication   Communication: No difficulties  Cognition Arousal/Alertness: Awake/alert Behavior During Therapy: WFL for tasks assessed/performed Overall Cognitive Status: Within Functional Limits for  tasks assessed                                        General Comments      Exercises     Assessment/Plan    PT Assessment Patient needs continued PT services  PT Problem List Decreased activity  tolerance;Decreased balance;Decreased mobility;Decreased strength       PT Treatment Interventions Gait training;Functional mobility training;Therapeutic activities;Therapeutic exercise    PT Goals (Current goals can be found in the Care Plan section)  Acute Rehab PT Goals Patient Stated Goal: return home PT Goal Formulation: With patient Time For Goal Achievement: 03/19/17 Potential to Achieve Goals: Good    Frequency Min 3X/week   Barriers to discharge        Co-evaluation               AM-PAC PT "6 Clicks" Daily Activity  Outcome Measure Difficulty turning over in bed (including adjusting bedclothes, sheets and blankets)?: None Difficulty moving from lying on back to sitting on the side of the bed? : None Difficulty sitting down on and standing up from a chair with arms (e.g., wheelchair, bedside commode, etc,.)?: A Lot Help needed moving to and from a bed to chair (including a wheelchair)?: A Little Help needed walking in hospital room?: A Little Help needed climbing 3-5 steps with a railing? : A Lot 6 Click Score: 18    End of Session Equipment Utilized During Treatment: Gait belt;Oxygen Activity Tolerance: Patient tolerated treatment well;Patient limited by fatigue Patient left: in chair;with call bell/phone within reach Nurse Communication: Mobility status PT Visit Diagnosis: Unsteadiness on feet (R26.81);Other abnormalities of gait and mobility (R26.89);Muscle weakness (generalized) (M62.81)    Time: 1638-4536 PT Time Calculation (min) (ACUTE ONLY): 25 min   Charges:   PT Evaluation $PT Eval Moderate Complexity: 1 Mod PT Treatments $Therapeutic Activity: 23-37 mins   PT G Codes:        2:26 PM, 03/24/2017 Lonell Grandchild, MPT Physical Therapist with [facility] [phone] office [phone] mobile phone

## 2017-03-05 NOTE — Care Management Important Message (Signed)
Important Message  Patient Details  Name: [name] MRN: [id number] Date of Birth: [birth year]   Medicare Important Message Given:  Yes    Sherald Barge, RN 03/05/2017, 1:51 PM

## 2017-03-05 NOTE — Progress Notes (Addendum)
Subjective: She has no new complaints.  Her breathing is doing better.  Objective: Vital signs in last 24 hours: Temp:  [98.1 F (36.7 C)-99.3 F (37.4 C)] 98.6 F (37 C) (02/24 2236) Pulse Rate:  [76-100] 80 (02/25 0518) Resp:  [18-25] 18 (02/25 0518) BP: (96-138)/(46-75) 116/46 (02/25 0518) SpO2:  [94 %-99 %] 98 % (02/25 0518) Weight:  [70.8 kg (156 lb 1.4 oz)] 70.8 kg (156 lb 1.4 oz) (02/25 0518) Weight change: -0.6 kg (-5.2 oz) Last BM Date: 03/03/17  Intake/Output from previous day: No intake/output data recorded.  PHYSICAL EXAM General appearance: alert, cooperative and no distress Resp: clear to auscultation bilaterally Cardio: regular rate and rhythm, S1, S2 normal, no murmur, click, rub or gallop GI: soft, non-tender; bowel sounds normal; no masses,  no organomegaly Extremities: extremities normal, atraumatic, no cyanosis or edema  Lab Results:  Results for orders placed or performed during the hospital encounter of 02/19/17 (from the past 48 hour(s))  Glucose, capillary     Status: Abnormal   Collection Time: 03/03/17 11:20 AM  Result Value Ref Range   Glucose-Capillary 176 (H) 65 - 99 mg/dL  Glucose, capillary     Status: Abnormal   Collection Time: 03/03/17  4:35 PM  Result Value Ref Range   Glucose-Capillary 128 (H) 65 - 99 mg/dL  Glucose, capillary     Status: Abnormal   Collection Time: 03/03/17  8:07 PM  Result Value Ref Range   Glucose-Capillary 164 (H) 65 - 99 mg/dL  Glucose, capillary     Status: Abnormal   Collection Time: 03/04/17 12:55 AM  Result Value Ref Range   Glucose-Capillary 137 (H) 65 - 99 mg/dL  Protime-INR     Status: Abnormal   Collection Time: 03/04/17  3:57 AM  Result Value Ref Range   Prothrombin Time 15.8 (H) 11.4 - 15.2 seconds   INR 1.28     Comment: Performed at [facility], [address], [place], [place] [address]  Glucose, capillary     Status: Abnormal   Collection Time: 03/04/17  4:15 AM  Result Value Ref Range   Glucose-Capillary 118 (H) 65 - 99 mg/dL  Glucose, capillary     Status: Abnormal   Collection Time: 03/04/17  7:46 AM  Result Value Ref Range   Glucose-Capillary 105 (H) 65 - 99 mg/dL  Protime-INR     Status: Abnormal   Collection Time: 03/05/17  5:20 AM  Result Value Ref Range   Prothrombin Time 15.5 (H) 11.4 - 15.2 seconds   INR 1.24     Comment: Performed at [facility], [address], [place], [place] [address]    ABGS No results for input(s): PHART, PO2ART, TCO2, HCO3 in the last 72 hours.  Invalid input(s): PCO2 CULTURES No results found for this or any previous visit (from the past 240 hour(s)). Studies/Results: No results found.  Medications:  Prior to Admission:  Medications Prior to Admission  Medication Sig Dispense Refill Last Dose  . albuterol (PROVENTIL) (2.5 MG/3ML) 0.083% nebulizer solution USE 1 VIAL BY NEBULIZER EVERY 4 HOURS AS NEEDED FOR WHEEZING. DX: J44.9 375 vial 0 Past Month at Unknown time  . budesonide (PULMICORT) 0.5 MG/2ML nebulizer solution Take 2 mLs (0.5 mg total) by nebulization 2 (two) times daily. Dx: J43.9 120 mL 3 Past Month at Unknown time  . fentaNYL (DURAGESIC - DOSED MCG/HR) 50 MCG/HR Place 50 mcg onto the skin every 3 (three) days.   Past Month at Unknown time  . fish  oil-omega-3 fatty acids 1000 MG capsule Take 2 g by mouth daily.     Past Month at Unknown time  . ipratropium-albuterol (DUONEB) 0.5-2.5 (3) MG/3ML SOLN 1 vial via nebulizer at breakfast, lunch, dinner and bedtime daily. May take 2 additional treatments on bad day Dx: J43.9 360 mL 3 Past Month at Unknown time  . levothyroxine (SYNTHROID, LEVOTHROID) 125 MCG tablet take 1 tablet by mouth once daily 90 tablet 2 Past Month at Unknown time  . OXYGEN Inhale 2 L into the lungs continuous.   Past Month at Unknown time  . warfarin (COUMADIN) 1 MG tablet Take 1 tablet (1mg ) on Tuesdays and Saturdays along with a 5mg  tablet (Patient taking differently: Take 1 mg by mouth 2 (two) times a  week. Take 1 tablet (1mg ) on Tuesdays and Fridays along with a 5mg  tablet) 30 tablet 4 Past Month at Unknown time  . warfarin (COUMADIN) 5 MG tablet Take 1 tablet (5 mg total) by mouth daily. (Patient taking differently: Take 5-6 mg by mouth daily. 6mg  on Saturdays, 5mg  on Sunday, Monday, 6mg  on Tuesdays, 5mg  on Wednesdays, 6mg  on Thursdays, and 5mg  on Fridays.) 90 tablet 3 Past Month at Unknown time  . nitroGLYCERIN (NITROSTAT) 0.4 MG SL tablet Place 1 tablet (0.4 mg total) under the tongue every 5 (five) minutes as needed for chest pain. 25 tablet 3 unknown  . Respiratory Therapy Supplies (FLUTTER) DEVI Use as directed 1 each 0 Taking   Scheduled: . chlorhexidine  15 mL Mouth Rinse BID  . enoxaparin (LOVENOX) injection  80 mg Subcutaneous Q12H  . famotidine  20 mg Oral Daily  . guaiFENesin  1,200 mg Oral BID  . ipratropium-albuterol  3 mL Nebulization Q6H WA  . levothyroxine  125 mcg Oral QAC breakfast  . mouth rinse  15 mL Mouth Rinse q12n4p  . methylPREDNISolone (SOLU-MEDROL) injection  60 mg Intravenous Q12H  . ticagrelor  60 mg Oral BID  . Warfarin - Pharmacist Dosing Inpatient   Does not apply Q24H   Continuous: . sodium chloride     GQQ:PYPPJK chloride, albuterol, ketorolac, LORazepam, metoprolol tartrate  Assesment: She was admitted with COPD exacerbation and acute hypoxic and hypercapnic respiratory failure.  She has pretty severe COPD at baseline.  She has improved remarkably.  She had influenza.  She has been treated for that.  She was septic on admission and that is better. Principal Problem:   COPD exacerbation ([place]) Active Problems:   Hypothyroidism   Factor V Leiden, prothrombin gene mutation ([place])   Tachycardia   Influenza A   Acute respiratory failure ([place])    Plan: She is on budesonide and DuoNeb at home which apparently she uses regularly.  She has been following with pulmonologist in [place] but she may be a candidate for our pilot COPD program and I will  discuss that with case management.  I will plan to sign off    LOS: 14 days   Arbor Cohen L 03/05/2017, 8:16 AM

## 2017-03-05 NOTE — Progress Notes (Signed)
[name] transferred to room 330 from ICU.   Patient alert and oriented to room.,TV control,call light, and bathroom etc.  Instructed to call for assistance and needs.  Patient verbalized understanding and call light placed within reach.

## 2017-03-05 NOTE — Progress Notes (Signed)
  Speech Language Pathology Treatment: Dysphagia  Patient Details Name: [name] MRN: [id number] DOB: 12-19-1950 Today's Date: 03/05/2017 Time: 2094-7096 SLP Time Calculation (min) (ACUTE ONLY): 14 min  Assessment / Plan / Recommendation Clinical Impression  Pt seen for ongoing diagnostic dysphagia intervention and seen sitting upright in chair. Pt appears sad today and voices that she just wants to go home (seen after PT evaluation). She reports feeling close to her baseline, but weak. Pt reports no issues with mechanical soft diet and thin liquids. Pt observed with regular texture trials and straw sips thin. Pt demonstrated one delayed cough after consecutive straw sips of apple juice. Pt reminded to take small sips due to h/o COPD and reduced coordination of respiration and swallow at times. Will advance to regular textures and sign off at this time as all education completed. Please re-consult if indicated.    HPI HPI: [name]  is a 67 y.o. female, w hx of CAD, Hypothyroidism, PE, Factor V leiden, Copd on home o2, apparently c/o increase in dyspnea for 24 hours prior to admission.  Slight cough, dry-yellow sputum.  Denies fever, chills, cp, palp, n/v, diarrhea, brbpr, black stool.  Pt presents to ED due to increase in dyspnea.      SLP Plan  Discharge SLP treatment due to (comment);All goals met       Recommendations  Diet recommendations: Regular;Thin liquid Liquids provided via: Cup;Straw Medication Administration: Whole meds with puree Supervision: Patient able to self feed;Intermittent supervision to cue for compensatory strategies Compensations: Slow rate;Small sips/bites;Multiple dry swallows after each bite/sip Postural Changes and/or Swallow Maneuvers: Seated upright 90 degrees;Upright 30-60 min after meal                Oral Care Recommendations: Oral care BID;Staff/trained caregiver to provide oral care Follow up Recommendations: None SLP Visit  Diagnosis: Dysphagia, oropharyngeal phase (R13.12) Plan: Discharge SLP treatment due to (comment);All goals met       Thank you,  Genene Churn, [place]                 [place] 03/05/2017, 11:44 AM

## 2017-03-06 ENCOUNTER — Telehealth: Payer: Self-pay | Admitting: Family Medicine

## 2017-03-06 ENCOUNTER — Telehealth: Payer: Self-pay | Admitting: Adult Health

## 2017-03-06 DIAGNOSIS — J9621 Acute and chronic respiratory failure with hypoxia: Secondary | ICD-10-CM | POA: Diagnosis not present

## 2017-03-06 DIAGNOSIS — Z9861 Coronary angioplasty status: Secondary | ICD-10-CM | POA: Diagnosis not present

## 2017-03-06 DIAGNOSIS — J441 Chronic obstructive pulmonary disease with (acute) exacerbation: Secondary | ICD-10-CM | POA: Diagnosis not present

## 2017-03-06 DIAGNOSIS — F17210 Nicotine dependence, cigarettes, uncomplicated: Secondary | ICD-10-CM | POA: Diagnosis not present

## 2017-03-06 DIAGNOSIS — J9622 Acute and chronic respiratory failure with hypercapnia: Secondary | ICD-10-CM | POA: Diagnosis not present

## 2017-03-06 DIAGNOSIS — I252 Old myocardial infarction: Secondary | ICD-10-CM | POA: Diagnosis not present

## 2017-03-06 DIAGNOSIS — E039 Hypothyroidism, unspecified: Secondary | ICD-10-CM | POA: Diagnosis not present

## 2017-03-06 DIAGNOSIS — Z7902 Long term (current) use of antithrombotics/antiplatelets: Secondary | ICD-10-CM | POA: Diagnosis not present

## 2017-03-06 DIAGNOSIS — D6852 Prothrombin gene mutation: Secondary | ICD-10-CM | POA: Diagnosis not present

## 2017-03-06 DIAGNOSIS — Z9981 Dependence on supplemental oxygen: Secondary | ICD-10-CM | POA: Diagnosis not present

## 2017-03-06 DIAGNOSIS — Z86711 Personal history of pulmonary embolism: Secondary | ICD-10-CM | POA: Diagnosis not present

## 2017-03-06 DIAGNOSIS — Z7951 Long term (current) use of inhaled steroids: Secondary | ICD-10-CM | POA: Diagnosis not present

## 2017-03-06 DIAGNOSIS — I2510 Atherosclerotic heart disease of native coronary artery without angina pectoris: Secondary | ICD-10-CM | POA: Diagnosis not present

## 2017-03-06 DIAGNOSIS — J101 Influenza due to other identified influenza virus with other respiratory manifestations: Secondary | ICD-10-CM | POA: Diagnosis not present

## 2017-03-06 DIAGNOSIS — Z5181 Encounter for therapeutic drug level monitoring: Secondary | ICD-10-CM | POA: Diagnosis not present

## 2017-03-06 DIAGNOSIS — Z7901 Long term (current) use of anticoagulants: Secondary | ICD-10-CM | POA: Diagnosis not present

## 2017-03-06 NOTE — Telephone Encounter (Signed)
I tried to reach pt, no answer and mail box was full, will try again later on. A script for Prednisolone was sent e-scribe on 03/05/2017 to [facility].

## 2017-03-06 NOTE — Telephone Encounter (Signed)
I called pt and mail box was full, will try again later on.

## 2017-03-06 NOTE — Telephone Encounter (Signed)
Copied from [place] [id number]. Topic: Quick Communication - See Telephone Encounter >> Mar 06, 2017  1:34 PM Margot Ables wrote: CRM for notification. See Telephone encounter for: 03/06/17.  Requesting VO for pts Plan of Care, pt was referred from the hospital after intubation, pt has COPD and will be in the COPD pilot program with pulmonologist  States Urgent need to have prednisone sent into [facility] in [place] for pt because she has none in the home.

## 2017-03-07 DIAGNOSIS — I2510 Atherosclerotic heart disease of native coronary artery without angina pectoris: Secondary | ICD-10-CM | POA: Diagnosis not present

## 2017-03-07 DIAGNOSIS — J101 Influenza due to other identified influenza virus with other respiratory manifestations: Secondary | ICD-10-CM | POA: Diagnosis not present

## 2017-03-07 DIAGNOSIS — D6852 Prothrombin gene mutation: Secondary | ICD-10-CM | POA: Diagnosis not present

## 2017-03-07 DIAGNOSIS — J9622 Acute and chronic respiratory failure with hypercapnia: Secondary | ICD-10-CM | POA: Diagnosis not present

## 2017-03-07 DIAGNOSIS — J9621 Acute and chronic respiratory failure with hypoxia: Secondary | ICD-10-CM | POA: Diagnosis not present

## 2017-03-07 DIAGNOSIS — J441 Chronic obstructive pulmonary disease with (acute) exacerbation: Secondary | ICD-10-CM | POA: Diagnosis not present

## 2017-03-07 NOTE — Telephone Encounter (Signed)
I called pt again, no answer or option to leave a voice message. Patient has a hospital follow up appointment with [name] on 03/08/2017 Thursday at 1:00 pm.

## 2017-03-07 NOTE — Telephone Encounter (Signed)
I tried to reach pt again and no answer or option to leave a message. Patient has a hospital follow up appointment with [name] on 03/08/2017 Thursday at 1:00 pm.

## 2017-03-08 ENCOUNTER — Ambulatory Visit (INDEPENDENT_AMBULATORY_CARE_PROVIDER_SITE_OTHER): Payer: Medicare Other | Admitting: *Deleted

## 2017-03-08 ENCOUNTER — Inpatient Hospital Stay: Payer: Medicare Other | Admitting: Adult Health

## 2017-03-08 ENCOUNTER — Ambulatory Visit: Payer: Self-pay

## 2017-03-08 DIAGNOSIS — I2510 Atherosclerotic heart disease of native coronary artery without angina pectoris: Secondary | ICD-10-CM | POA: Diagnosis not present

## 2017-03-08 DIAGNOSIS — J9621 Acute and chronic respiratory failure with hypoxia: Secondary | ICD-10-CM | POA: Diagnosis not present

## 2017-03-08 DIAGNOSIS — D6852 Prothrombin gene mutation: Secondary | ICD-10-CM | POA: Diagnosis not present

## 2017-03-08 DIAGNOSIS — Z5181 Encounter for therapeutic drug level monitoring: Secondary | ICD-10-CM

## 2017-03-08 DIAGNOSIS — J101 Influenza due to other identified influenza virus with other respiratory manifestations: Secondary | ICD-10-CM | POA: Diagnosis not present

## 2017-03-08 DIAGNOSIS — J441 Chronic obstructive pulmonary disease with (acute) exacerbation: Secondary | ICD-10-CM | POA: Diagnosis not present

## 2017-03-08 DIAGNOSIS — D6851 Activated protein C resistance: Secondary | ICD-10-CM

## 2017-03-08 DIAGNOSIS — J9622 Acute and chronic respiratory failure with hypercapnia: Secondary | ICD-10-CM | POA: Diagnosis not present

## 2017-03-08 LAB — POCT INR: INR: 2.3

## 2017-03-08 NOTE — Telephone Encounter (Signed)
I tried to reach pt again, no answer or option to leave a voice message, pt must have cancelled office visit for today.

## 2017-03-08 NOTE — Patient Instructions (Signed)
Decrease coumadin to 5mg  daily Stop lovenox Starting prednisone 30mg  daily x 5 days today then taper Recheck Monday 03/12/17 Order given to [employer]  RN [facility]

## 2017-03-08 NOTE — Telephone Encounter (Signed)
FYI

## 2017-03-08 NOTE — Telephone Encounter (Signed)
Called [name] with [facility] to inquire about her call to that said:  patient seems depressed, crying a lot. Not suicidal. Can something be called or does she need to be seen? I asked is she with the patient, she said "I saw her this morning. She was d/c from the hospital on Monday. She's crying, possibly depressed, her house looks like a hoarder's house-unclean, animals in the house peeing and pooping on the floor, she can't get around to do anything, you can barely move around in the house. Her daughter lives in [place] and I don't know how much assistance she gets from her. A social worker is going today and try to talk her into going to rehab. I didn't know if she would need to be seen in the office to get something ordered for depression." I advised I would call the patient to see what is going on. I called the patient, the patient says "I can't take this prednisone. I told them I couldn't take it and they insist on me taking it. It causes me to hallucinate, makes me sick on the stomach; I don't want to take it anymore." I asked who prescribed it, she says "I was released from the hospital on Monday and I made a mistake and told them I didn't want to go to rehab. But since I've been home, I haven't eaten anything, I can't walk." I asked her who takes her to her appointments, because she had one today with Tommi Rumps, she said "I didn't know and I can't walk to get anywhere. My daughter comes in the late evenings and she's not well herself." I asked did the social worker come today to talk about rehab, she said "yes, she said she would start the process to get me in rehab." I asked about her crying and depressed, she said "it's because I can't take care of myself", then proceeds to cry. "I should have went to rehab. I don't like this prednisone and how it makes me feel. I took it this morning and I am not taking anymore." I advised her that she has an appointment tomorrow with the coumadin clinic and Monday with the heart  doctor, she said "I can't go because I can't walk, so I will have to call and cancel." I asked her how does she get to the bathroom, she says "I have a potty chair right here beside my bed and I make my way on it, but it's hard." I asked who empties it, she said "the home health nurse emptied it for me today." I advised her to continue taking her medications as ordered and that I would let Tommi Rumps know all of this to see what can be done, she verbalized understanding.   Reason for Disposition . [1] Follow-up call to recent contact AND [2] information only call, no triage required  Answer Assessment - Initial Assessment Questions 1. REASON FOR CALL or QUESTION: "What is your reason for calling today?" or "How can I best help you?" or "What question do you have that I can help answer?"     I can't take this prednisone anymore  Protocols used: INFORMATION ONLY CALL-[facility]

## 2017-03-08 NOTE — Telephone Encounter (Signed)
I tried to reach pt again, no answer or option to leave a voice message, looks like pt cancelled office visit for today.

## 2017-03-09 DIAGNOSIS — J9621 Acute and chronic respiratory failure with hypoxia: Secondary | ICD-10-CM | POA: Diagnosis not present

## 2017-03-09 DIAGNOSIS — J441 Chronic obstructive pulmonary disease with (acute) exacerbation: Secondary | ICD-10-CM | POA: Diagnosis not present

## 2017-03-09 DIAGNOSIS — D6852 Prothrombin gene mutation: Secondary | ICD-10-CM | POA: Diagnosis not present

## 2017-03-09 DIAGNOSIS — I2510 Atherosclerotic heart disease of native coronary artery without angina pectoris: Secondary | ICD-10-CM | POA: Diagnosis not present

## 2017-03-09 DIAGNOSIS — J101 Influenza due to other identified influenza virus with other respiratory manifestations: Secondary | ICD-10-CM | POA: Diagnosis not present

## 2017-03-09 DIAGNOSIS — J9622 Acute and chronic respiratory failure with hypercapnia: Secondary | ICD-10-CM | POA: Diagnosis not present

## 2017-03-09 NOTE — Telephone Encounter (Signed)
Discussed with Tommi Rumps.  Per Tommi Rumps, have Kathlee Nations help pt to get social work help.  Will need to see pt in OV before antidepressants can be prescribed.  Spoke to Kathlee Nations and she informed me that [facility] social worker is helping to get pt in rehab.  Kathlee Nations to inform pt that she will need to come in for an OV for medication.  Nothing further needed at this time.  Will close note.

## 2017-03-12 ENCOUNTER — Telehealth: Payer: Self-pay | Admitting: *Deleted

## 2017-03-12 ENCOUNTER — Ambulatory Visit: Payer: Medicare Other | Admitting: Cardiovascular Disease

## 2017-03-12 ENCOUNTER — Ambulatory Visit (INDEPENDENT_AMBULATORY_CARE_PROVIDER_SITE_OTHER): Payer: Medicare Other | Admitting: *Deleted

## 2017-03-12 DIAGNOSIS — Z5181 Encounter for therapeutic drug level monitoring: Secondary | ICD-10-CM | POA: Diagnosis not present

## 2017-03-12 DIAGNOSIS — D6851 Activated protein C resistance: Secondary | ICD-10-CM | POA: Diagnosis not present

## 2017-03-12 DIAGNOSIS — J9621 Acute and chronic respiratory failure with hypoxia: Secondary | ICD-10-CM | POA: Diagnosis not present

## 2017-03-12 DIAGNOSIS — J9622 Acute and chronic respiratory failure with hypercapnia: Secondary | ICD-10-CM | POA: Diagnosis not present

## 2017-03-12 DIAGNOSIS — J441 Chronic obstructive pulmonary disease with (acute) exacerbation: Secondary | ICD-10-CM | POA: Diagnosis not present

## 2017-03-12 DIAGNOSIS — I2510 Atherosclerotic heart disease of native coronary artery without angina pectoris: Secondary | ICD-10-CM | POA: Diagnosis not present

## 2017-03-12 DIAGNOSIS — J101 Influenza due to other identified influenza virus with other respiratory manifestations: Secondary | ICD-10-CM | POA: Diagnosis not present

## 2017-03-12 DIAGNOSIS — D6852 Prothrombin gene mutation: Secondary | ICD-10-CM | POA: Diagnosis not present

## 2017-03-12 LAB — POCT INR: INR: 1.9

## 2017-03-12 NOTE — Telephone Encounter (Signed)
INR 1.9 - pt has stopped prednisone  Per Christie Beckers [phone]

## 2017-03-12 NOTE — Telephone Encounter (Signed)
Done.  See coumadin note. 

## 2017-03-12 NOTE — Telephone Encounter (Signed)
INR 1.9 - pt has stopped prednisone  Per Christie Beckers [phone]

## 2017-03-12 NOTE — Patient Instructions (Signed)
Increase coumadin to 5mg  daily except 6mg  on Mondays and Thursdays Still on prednisone taper Recheck Monday 03/19/17 Order given to [place]

## 2017-03-13 DIAGNOSIS — J441 Chronic obstructive pulmonary disease with (acute) exacerbation: Secondary | ICD-10-CM | POA: Diagnosis not present

## 2017-03-13 DIAGNOSIS — I2510 Atherosclerotic heart disease of native coronary artery without angina pectoris: Secondary | ICD-10-CM | POA: Diagnosis not present

## 2017-03-13 DIAGNOSIS — J101 Influenza due to other identified influenza virus with other respiratory manifestations: Secondary | ICD-10-CM | POA: Diagnosis not present

## 2017-03-13 DIAGNOSIS — J9621 Acute and chronic respiratory failure with hypoxia: Secondary | ICD-10-CM | POA: Diagnosis not present

## 2017-03-13 DIAGNOSIS — D6852 Prothrombin gene mutation: Secondary | ICD-10-CM | POA: Diagnosis not present

## 2017-03-13 DIAGNOSIS — J9622 Acute and chronic respiratory failure with hypercapnia: Secondary | ICD-10-CM | POA: Diagnosis not present

## 2017-03-14 DIAGNOSIS — J9622 Acute and chronic respiratory failure with hypercapnia: Secondary | ICD-10-CM | POA: Diagnosis not present

## 2017-03-14 DIAGNOSIS — J441 Chronic obstructive pulmonary disease with (acute) exacerbation: Secondary | ICD-10-CM | POA: Diagnosis not present

## 2017-03-14 DIAGNOSIS — J101 Influenza due to other identified influenza virus with other respiratory manifestations: Secondary | ICD-10-CM | POA: Diagnosis not present

## 2017-03-14 DIAGNOSIS — D6852 Prothrombin gene mutation: Secondary | ICD-10-CM | POA: Diagnosis not present

## 2017-03-14 DIAGNOSIS — I2510 Atherosclerotic heart disease of native coronary artery without angina pectoris: Secondary | ICD-10-CM | POA: Diagnosis not present

## 2017-03-14 DIAGNOSIS — J9621 Acute and chronic respiratory failure with hypoxia: Secondary | ICD-10-CM | POA: Diagnosis not present

## 2017-03-19 ENCOUNTER — Ambulatory Visit (INDEPENDENT_AMBULATORY_CARE_PROVIDER_SITE_OTHER): Payer: Medicare Other | Admitting: *Deleted

## 2017-03-19 DIAGNOSIS — D6851 Activated protein C resistance: Secondary | ICD-10-CM | POA: Diagnosis not present

## 2017-03-19 DIAGNOSIS — J101 Influenza due to other identified influenza virus with other respiratory manifestations: Secondary | ICD-10-CM | POA: Diagnosis not present

## 2017-03-19 DIAGNOSIS — Z5181 Encounter for therapeutic drug level monitoring: Secondary | ICD-10-CM | POA: Diagnosis not present

## 2017-03-19 DIAGNOSIS — J441 Chronic obstructive pulmonary disease with (acute) exacerbation: Secondary | ICD-10-CM | POA: Diagnosis not present

## 2017-03-19 DIAGNOSIS — J9622 Acute and chronic respiratory failure with hypercapnia: Secondary | ICD-10-CM | POA: Diagnosis not present

## 2017-03-19 DIAGNOSIS — D6852 Prothrombin gene mutation: Secondary | ICD-10-CM | POA: Diagnosis not present

## 2017-03-19 DIAGNOSIS — I2510 Atherosclerotic heart disease of native coronary artery without angina pectoris: Secondary | ICD-10-CM | POA: Diagnosis not present

## 2017-03-19 DIAGNOSIS — J9621 Acute and chronic respiratory failure with hypoxia: Secondary | ICD-10-CM | POA: Diagnosis not present

## 2017-03-19 LAB — POCT INR: INR: 2.1

## 2017-03-19 NOTE — Patient Instructions (Signed)
Continue coumadin 5mg  daily except 6mg  on Mondays and Thursdays Recheck 1 week Order given to [employer] [facility]

## 2017-03-20 ENCOUNTER — Telehealth: Payer: Self-pay | Admitting: Acute Care

## 2017-03-20 DIAGNOSIS — I2510 Atherosclerotic heart disease of native coronary artery without angina pectoris: Secondary | ICD-10-CM | POA: Diagnosis not present

## 2017-03-20 DIAGNOSIS — D6852 Prothrombin gene mutation: Secondary | ICD-10-CM | POA: Diagnosis not present

## 2017-03-20 DIAGNOSIS — J101 Influenza due to other identified influenza virus with other respiratory manifestations: Secondary | ICD-10-CM | POA: Diagnosis not present

## 2017-03-20 DIAGNOSIS — J441 Chronic obstructive pulmonary disease with (acute) exacerbation: Secondary | ICD-10-CM | POA: Diagnosis not present

## 2017-03-20 DIAGNOSIS — J9621 Acute and chronic respiratory failure with hypoxia: Secondary | ICD-10-CM | POA: Diagnosis not present

## 2017-03-20 DIAGNOSIS — J9622 Acute and chronic respiratory failure with hypercapnia: Secondary | ICD-10-CM | POA: Diagnosis not present

## 2017-03-20 MED ORDER — IPRATROPIUM-ALBUTEROL 0.5-2.5 (3) MG/3ML IN SOLN: 3.0000 mL | Freq: Four times a day (QID) | RESPIRATORY_TRACT | 3 refills | Status: DC

## 2017-03-20 NOTE — Telephone Encounter (Signed)
Rx has been sent to [facility]. Nothing further was needed.

## 2017-03-21 DIAGNOSIS — D6852 Prothrombin gene mutation: Secondary | ICD-10-CM | POA: Diagnosis not present

## 2017-03-21 DIAGNOSIS — J9622 Acute and chronic respiratory failure with hypercapnia: Secondary | ICD-10-CM | POA: Diagnosis not present

## 2017-03-21 DIAGNOSIS — I2510 Atherosclerotic heart disease of native coronary artery without angina pectoris: Secondary | ICD-10-CM | POA: Diagnosis not present

## 2017-03-21 DIAGNOSIS — J9621 Acute and chronic respiratory failure with hypoxia: Secondary | ICD-10-CM | POA: Diagnosis not present

## 2017-03-21 DIAGNOSIS — J101 Influenza due to other identified influenza virus with other respiratory manifestations: Secondary | ICD-10-CM | POA: Diagnosis not present

## 2017-03-21 DIAGNOSIS — J441 Chronic obstructive pulmonary disease with (acute) exacerbation: Secondary | ICD-10-CM | POA: Diagnosis not present

## 2017-03-22 DIAGNOSIS — D6852 Prothrombin gene mutation: Secondary | ICD-10-CM | POA: Diagnosis not present

## 2017-03-22 DIAGNOSIS — J9621 Acute and chronic respiratory failure with hypoxia: Secondary | ICD-10-CM | POA: Diagnosis not present

## 2017-03-22 DIAGNOSIS — J9622 Acute and chronic respiratory failure with hypercapnia: Secondary | ICD-10-CM | POA: Diagnosis not present

## 2017-03-22 DIAGNOSIS — I2510 Atherosclerotic heart disease of native coronary artery without angina pectoris: Secondary | ICD-10-CM | POA: Diagnosis not present

## 2017-03-22 DIAGNOSIS — J101 Influenza due to other identified influenza virus with other respiratory manifestations: Secondary | ICD-10-CM | POA: Diagnosis not present

## 2017-03-22 DIAGNOSIS — J441 Chronic obstructive pulmonary disease with (acute) exacerbation: Secondary | ICD-10-CM | POA: Diagnosis not present

## 2017-03-23 DIAGNOSIS — I2510 Atherosclerotic heart disease of native coronary artery without angina pectoris: Secondary | ICD-10-CM | POA: Diagnosis not present

## 2017-03-23 DIAGNOSIS — J9622 Acute and chronic respiratory failure with hypercapnia: Secondary | ICD-10-CM | POA: Diagnosis not present

## 2017-03-23 DIAGNOSIS — J441 Chronic obstructive pulmonary disease with (acute) exacerbation: Secondary | ICD-10-CM | POA: Diagnosis not present

## 2017-03-23 DIAGNOSIS — J9621 Acute and chronic respiratory failure with hypoxia: Secondary | ICD-10-CM | POA: Diagnosis not present

## 2017-03-23 DIAGNOSIS — J101 Influenza due to other identified influenza virus with other respiratory manifestations: Secondary | ICD-10-CM | POA: Diagnosis not present

## 2017-03-23 DIAGNOSIS — D6852 Prothrombin gene mutation: Secondary | ICD-10-CM | POA: Diagnosis not present

## 2017-03-27 ENCOUNTER — Inpatient Hospital Stay: Payer: Medicare Other | Admitting: Adult Health

## 2017-03-27 DIAGNOSIS — J101 Influenza due to other identified influenza virus with other respiratory manifestations: Secondary | ICD-10-CM | POA: Diagnosis not present

## 2017-03-27 DIAGNOSIS — J9621 Acute and chronic respiratory failure with hypoxia: Secondary | ICD-10-CM | POA: Diagnosis not present

## 2017-03-27 DIAGNOSIS — J9622 Acute and chronic respiratory failure with hypercapnia: Secondary | ICD-10-CM | POA: Diagnosis not present

## 2017-03-27 DIAGNOSIS — J441 Chronic obstructive pulmonary disease with (acute) exacerbation: Secondary | ICD-10-CM | POA: Diagnosis not present

## 2017-03-27 DIAGNOSIS — I2510 Atherosclerotic heart disease of native coronary artery without angina pectoris: Secondary | ICD-10-CM | POA: Diagnosis not present

## 2017-03-27 DIAGNOSIS — D6852 Prothrombin gene mutation: Secondary | ICD-10-CM | POA: Diagnosis not present

## 2017-03-28 ENCOUNTER — Telehealth: Payer: Self-pay | Admitting: *Deleted

## 2017-03-28 ENCOUNTER — Ambulatory Visit (INDEPENDENT_AMBULATORY_CARE_PROVIDER_SITE_OTHER): Payer: Medicare Other | Admitting: *Deleted

## 2017-03-28 DIAGNOSIS — Z5181 Encounter for therapeutic drug level monitoring: Secondary | ICD-10-CM

## 2017-03-28 DIAGNOSIS — J9621 Acute and chronic respiratory failure with hypoxia: Secondary | ICD-10-CM | POA: Diagnosis not present

## 2017-03-28 DIAGNOSIS — D6851 Activated protein C resistance: Secondary | ICD-10-CM | POA: Diagnosis not present

## 2017-03-28 DIAGNOSIS — J9622 Acute and chronic respiratory failure with hypercapnia: Secondary | ICD-10-CM | POA: Diagnosis not present

## 2017-03-28 DIAGNOSIS — D6852 Prothrombin gene mutation: Secondary | ICD-10-CM | POA: Diagnosis not present

## 2017-03-28 DIAGNOSIS — I2510 Atherosclerotic heart disease of native coronary artery without angina pectoris: Secondary | ICD-10-CM | POA: Diagnosis not present

## 2017-03-28 DIAGNOSIS — J441 Chronic obstructive pulmonary disease with (acute) exacerbation: Secondary | ICD-10-CM | POA: Diagnosis not present

## 2017-03-28 DIAGNOSIS — J101 Influenza due to other identified influenza virus with other respiratory manifestations: Secondary | ICD-10-CM | POA: Diagnosis not present

## 2017-03-28 LAB — POCT INR: INR: 3

## 2017-03-28 NOTE — Telephone Encounter (Signed)
Done.  See coumadin note. 

## 2017-03-28 NOTE — Patient Instructions (Signed)
Hold coumadin tonight then resume 5mg  daily except 6mg  on Mondays and Thursdays Recheck 1 week Order given to [employer] [facility]

## 2017-03-28 NOTE — Telephone Encounter (Signed)
INR - 3.0 / Call Kathlee Nations w/ instructions / tg

## 2017-03-28 NOTE — Progress Notes (Deleted)
Cardiology Office Note    Date:  03/28/2017   ID:  [name], DOB June 24, 1950, MRN [id number]  PCP:  [name], NP  Cardiologist: Kate Sable, MD  No chief complaint on file.   History of Present Illness:  [name] is a 67 y.o. female with history of CAD status post inferior STEMI treated with stent to the RCA 2002, 2003 cutting balloon and brachytherapy for restenosis, acute stent thrombosis 06/2010 requiring 2 separate interventions with a Zeta stent then repeat cath with thrombectomy.  Cath 2013 patent stents minor nonobstructive disease.  On Brilinta, history of pulmonary emboli with factor V Leiden deficiency on Coumadin therapy, COPD with ongoing tobacco abuse on home O2.  Last seen in our office 11/2015  Patient was discharged from the hospital 03/05/17 after admission with COPD exacerbation and influenza requiring intubation and subsequent BiPAP.  CT was negative for PE.  Past Medical History:  Diagnosis Date  . Acute MI ([place])    x4, code blue x3  . Arteriosclerotic cardiovascular disease (ASCVD) 2002   Inf STEMI-2002. 2003-cutting balloon + brachytherapy for restenosis; subsequent acute stent thrombosis 06/2010 requiring 2 separate interventions (Zeta stent, then repeat cath with thrombectomy). focal basal inf AK, nl EF; 03/2011: Patent stents, minor nonobst  residual dz, nl EF; neg stress nuclear in 2008 and stress echo in 2009  . Chronic anticoagulation    Warfarin plus ticagrelor  . Chronic respiratory failure ([place]) 08/27/2013   On 2L 02  . COPD (chronic obstructive pulmonary disease) ([place])    02 dependent  . Factor V Leiden, prothrombin gene mutation ([place]) 2006  . Hyperlipidemia   . Hypothyroidism   . Noncompliance   . Pelvic fracture ([place]) 2009  . Pulmonary embolism ([place]) 2006   Associated with deep vein thrombosis-2006; + factor V Leiden  . Tobacco abuse    50 pack years    Past Surgical History:  Procedure Laterality Date  . COLONOSCOPY   Approximately 2000   Negative screening study  . CORONARY ANGIOPLASTY  2002, 2003, 2012  . LEFT AND RIGHT HEART CATHETERIZATION WITH CORONARY ANGIOGRAM N/A 04/03/2011   Procedure: LEFT AND RIGHT HEART CATHETERIZATION WITH CORONARY ANGIOGRAM;  Surgeon: Sherren Mocha, MD;  Location[facility] CATH LAB;  Service: Cardiovascular;  Laterality: N/A;    Current Medications: No outpatient medications have been marked as taking for the 04/02/17 encounter (Appointment) with Imogene Burn, PA-C.     Allergies:   Dilaudid [hydromorphone hcl]; Minocycline hcl; Prednisone; Varenicline tartrate; and Zocor [simvastatin - high dose]   Social History   Socioeconomic History  . Marital status: Single    Spouse name: Not on file  . Number of children: Not on file  . Years of education: Not on file  . Highest education level: Not on file  Social Needs  . Financial resource strain: Not on file  . Food insecurity - worry: Not on file  . Food insecurity - inability: Not on file  . Transportation needs - medical: Not on file  . Transportation needs - non-medical: Not on file  Occupational History  . Occupation: Disabled  . Occupation: Truck Geophysicist/field seismologist  Tobacco Use  . Smoking status: Current Some Day Smoker    Packs/day: 0.50    Years: 50.00    Pack years: 25.00    Types: Cigarettes    Start date: 05/10/1968  . Smokeless tobacco: Never Used  Substance and Sexual Activity  . Alcohol use: No    Alcohol/week: 0.0 oz  .  Drug use: No  . Sexual activity: Not on file  Other Topics Concern  . Not on file  Social History Narrative   Originally from [place]. Previously has lived in [place], [place], [place] & moved back to [place] in 1994. 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 Ronie Spies was in her current home. She also had an owl living in her house as a rehab pet. She also had chickens until Fall 2015. No mold exposure.      Family History:  The patient's  family history includes Alzheimer's disease in her mother; Emphysema in her mother and sister; Factor V Leiden deficiency in her daughter, father, and sister; Heart disease in her father; Kidney cancer in her paternal grandmother.   ROS:   Please see the history of present illness.    Review of Systems  Constitution: Negative.  HENT: Negative.   Eyes: Negative.   Cardiovascular: Negative.   Respiratory: Negative.   Hematologic/Lymphatic: Negative.   Musculoskeletal: Negative.  Negative for joint pain.  Gastrointestinal: Negative.   Genitourinary: Negative.   Neurological: Negative.    All other systems reviewed and are negative.   PHYSICAL EXAM:   VS:  There were no vitals taken for this visit.  Physical Exam  GEN: Well nourished, well developed, in no acute distress  HEENT: normal  Neck: no JVD, carotid bruits, or masses Cardiac:RRR; no murmurs, rubs, or gallops  Respiratory:  clear to auscultation bilaterally, normal work of breathing GI: soft, nontender, nondistended, + BS Ext: without cyanosis, clubbing, or edema, Good distal pulses bilaterally MS: no deformity or atrophy  Skin: warm and dry, no rash Neuro:  Alert and Oriented x 3, Strength and sensation are intact Psych: euthymic mood, full affect  Wt Readings from Last 3 Encounters:  03/05/17 156 lb 1.4 oz (70.8 kg)  01/24/17 167 lb 12.8 oz (76.1 kg)  09/21/16 170 lb 12.8 oz (77.5 kg)      Studies/Labs Reviewed:   EKG:  EKG is*** ordered today.  The ekg ordered today demonstrates ***  Recent Labs: 09/21/2016: TSH 0.88 02/19/2017: B Natriuretic Peptide 121.0 02/23/2017: Magnesium 2.0 03/03/2017: ALT 105; BUN 26; Creatinine, Ser 0.66; Hemoglobin 12.8; Platelets 242; Potassium 4.5; Sodium 139   Lipid Panel    Component Value Date/Time   CHOL 194 09/21/2016 1031   TRIG 90.0 09/21/2016 1031   HDL 52.20 09/21/2016 1031   CHOLHDL 4 09/21/2016 1031   VLDL 18.0 09/21/2016 1031   LDLCALC 124 (H) 09/21/2016 1031    LDLDIRECT 172.6 04/01/2008 1205    Additional studies/ records that were reviewed today include:   2 Decho 02/21/17 Study Conclusions   - Left ventricle: The cavity size was normal. Wall thickness was   increased in a pattern of mild LVH. Systolic function was normal.   The estimated ejection fraction was in the range of 55% to 60%.   Wall motion was normal; there were no regional wall motion   abnormalities. Features are consistent with a pseudonormal left   ventricular filling pattern, with concomitant abnormal relaxation   and increased filling pressure (grade 2 diastolic dysfunction).   Doppler parameters are consistent with high ventricular filling   pressure. - Mitral valve: There was mild regurgitation. - Pericardium, extracardiac: A trivial pericardial effusion was   identified.      ASSESSMENT:    1. Arteriosclerotic cardiovascular disease (ASCVD)   2. Factor V Leiden, prothrombin  gene mutation ([place])   3. Other emphysema ([place])      PLAN:  In order of problems listed above:   CAD status post prior PCI and stenting to the RCA with both in-stent restenosis as well as in-stent thrombosis requiring stenting and thrombectomy in 2012.  Last cath in 2013 patent stents with minor nonobstructive disease.  On Brilinta.  Recent 2D echo 02/2017 normal LVEF 55-60% with grade 2 DD  Factor V Leiden deficiency with history of pulmonary embolus on Coumadin  COPD on home O2 with recent exacerbation in the setting of influenza requiring intubation.  Medication Adjustments/Labs and Tests Ordered: Current medicines are reviewed at length with the patient today.  Concerns regarding medicines are outlined above.  Medication changes, Labs and Tests ordered today are listed in the Patient Instructions below. There are no Patient Instructions on file for this visit.   Signed, Ermalinda Barrios, PA-C  03/28/2017 3:17 PM    [facility] [address], [place], [place]   [address] Phone: [phone]; Fax: [phone]

## 2017-03-29 DIAGNOSIS — J9622 Acute and chronic respiratory failure with hypercapnia: Secondary | ICD-10-CM | POA: Diagnosis not present

## 2017-03-29 DIAGNOSIS — J441 Chronic obstructive pulmonary disease with (acute) exacerbation: Secondary | ICD-10-CM | POA: Diagnosis not present

## 2017-03-29 DIAGNOSIS — I2510 Atherosclerotic heart disease of native coronary artery without angina pectoris: Secondary | ICD-10-CM | POA: Diagnosis not present

## 2017-03-29 DIAGNOSIS — J9621 Acute and chronic respiratory failure with hypoxia: Secondary | ICD-10-CM | POA: Diagnosis not present

## 2017-03-29 DIAGNOSIS — J101 Influenza due to other identified influenza virus with other respiratory manifestations: Secondary | ICD-10-CM | POA: Diagnosis not present

## 2017-03-29 DIAGNOSIS — D6852 Prothrombin gene mutation: Secondary | ICD-10-CM | POA: Diagnosis not present

## 2017-04-02 ENCOUNTER — Ambulatory Visit: Payer: Medicare Other | Admitting: Physician Assistant

## 2017-04-02 DIAGNOSIS — R0989 Other specified symptoms and signs involving the circulatory and respiratory systems: Secondary | ICD-10-CM

## 2017-04-03 ENCOUNTER — Encounter: Payer: Self-pay | Admitting: Physician Assistant

## 2017-04-04 ENCOUNTER — Telehealth: Payer: Self-pay | Admitting: *Deleted

## 2017-04-04 ENCOUNTER — Ambulatory Visit (INDEPENDENT_AMBULATORY_CARE_PROVIDER_SITE_OTHER): Payer: Medicare Other | Admitting: *Deleted

## 2017-04-04 DIAGNOSIS — Z5181 Encounter for therapeutic drug level monitoring: Secondary | ICD-10-CM | POA: Diagnosis not present

## 2017-04-04 DIAGNOSIS — D6851 Activated protein C resistance: Secondary | ICD-10-CM

## 2017-04-04 DIAGNOSIS — I2510 Atherosclerotic heart disease of native coronary artery without angina pectoris: Secondary | ICD-10-CM | POA: Diagnosis not present

## 2017-04-04 DIAGNOSIS — J9622 Acute and chronic respiratory failure with hypercapnia: Secondary | ICD-10-CM | POA: Diagnosis not present

## 2017-04-04 DIAGNOSIS — J9621 Acute and chronic respiratory failure with hypoxia: Secondary | ICD-10-CM | POA: Diagnosis not present

## 2017-04-04 DIAGNOSIS — J101 Influenza due to other identified influenza virus with other respiratory manifestations: Secondary | ICD-10-CM | POA: Diagnosis not present

## 2017-04-04 DIAGNOSIS — J441 Chronic obstructive pulmonary disease with (acute) exacerbation: Secondary | ICD-10-CM | POA: Diagnosis not present

## 2017-04-04 DIAGNOSIS — D6852 Prothrombin gene mutation: Secondary | ICD-10-CM | POA: Diagnosis not present

## 2017-04-04 LAB — POCT INR: INR: 1.9

## 2017-04-04 NOTE — Telephone Encounter (Signed)
Done.  See coumadin note. 

## 2017-04-04 NOTE — Patient Instructions (Signed)
Continue coumadin 5mg  daily except 6mg  on Mondays and Thursdays Recheck 1 week Order given to [employer] [facility]

## 2017-04-04 NOTE — Telephone Encounter (Signed)
INR 1.9 per Kathlee Nations w/ [facility] please give her a call @ [phone]

## 2017-04-05 DIAGNOSIS — I2510 Atherosclerotic heart disease of native coronary artery without angina pectoris: Secondary | ICD-10-CM | POA: Diagnosis not present

## 2017-04-05 DIAGNOSIS — J9622 Acute and chronic respiratory failure with hypercapnia: Secondary | ICD-10-CM | POA: Diagnosis not present

## 2017-04-05 DIAGNOSIS — J101 Influenza due to other identified influenza virus with other respiratory manifestations: Secondary | ICD-10-CM | POA: Diagnosis not present

## 2017-04-05 DIAGNOSIS — D6852 Prothrombin gene mutation: Secondary | ICD-10-CM | POA: Diagnosis not present

## 2017-04-05 DIAGNOSIS — J9621 Acute and chronic respiratory failure with hypoxia: Secondary | ICD-10-CM | POA: Diagnosis not present

## 2017-04-05 DIAGNOSIS — J441 Chronic obstructive pulmonary disease with (acute) exacerbation: Secondary | ICD-10-CM | POA: Diagnosis not present

## 2017-04-06 ENCOUNTER — Other Ambulatory Visit: Payer: Self-pay | Admitting: Cardiovascular Disease

## 2017-04-09 ENCOUNTER — Ambulatory Visit (INDEPENDENT_AMBULATORY_CARE_PROVIDER_SITE_OTHER): Payer: Medicare Other | Admitting: *Deleted

## 2017-04-09 ENCOUNTER — Telehealth: Payer: Self-pay | Admitting: *Deleted

## 2017-04-09 DIAGNOSIS — I2510 Atherosclerotic heart disease of native coronary artery without angina pectoris: Secondary | ICD-10-CM | POA: Diagnosis not present

## 2017-04-09 DIAGNOSIS — Z5181 Encounter for therapeutic drug level monitoring: Secondary | ICD-10-CM

## 2017-04-09 DIAGNOSIS — J101 Influenza due to other identified influenza virus with other respiratory manifestations: Secondary | ICD-10-CM | POA: Diagnosis not present

## 2017-04-09 DIAGNOSIS — J9621 Acute and chronic respiratory failure with hypoxia: Secondary | ICD-10-CM | POA: Diagnosis not present

## 2017-04-09 DIAGNOSIS — J441 Chronic obstructive pulmonary disease with (acute) exacerbation: Secondary | ICD-10-CM | POA: Diagnosis not present

## 2017-04-09 DIAGNOSIS — D6852 Prothrombin gene mutation: Secondary | ICD-10-CM | POA: Diagnosis not present

## 2017-04-09 DIAGNOSIS — D6851 Activated protein C resistance: Secondary | ICD-10-CM

## 2017-04-09 DIAGNOSIS — J9622 Acute and chronic respiratory failure with hypercapnia: Secondary | ICD-10-CM | POA: Diagnosis not present

## 2017-04-09 LAB — POCT INR: INR: 2.4

## 2017-04-09 NOTE — Patient Instructions (Signed)
Continue coumadin 5mg  daily except 6mg  on Mondays and Thursdays Recheck 1 week Order given to [employer] [facility]

## 2017-04-09 NOTE — Telephone Encounter (Signed)
Done.  See coumadin note. 

## 2017-04-09 NOTE — Telephone Encounter (Signed)
INR 2.4 per Kathlee Nations @[phone]

## 2017-04-10 DIAGNOSIS — J9621 Acute and chronic respiratory failure with hypoxia: Secondary | ICD-10-CM | POA: Diagnosis not present

## 2017-04-10 DIAGNOSIS — J101 Influenza due to other identified influenza virus with other respiratory manifestations: Secondary | ICD-10-CM | POA: Diagnosis not present

## 2017-04-10 DIAGNOSIS — D6852 Prothrombin gene mutation: Secondary | ICD-10-CM | POA: Diagnosis not present

## 2017-04-10 DIAGNOSIS — J9622 Acute and chronic respiratory failure with hypercapnia: Secondary | ICD-10-CM | POA: Diagnosis not present

## 2017-04-10 DIAGNOSIS — J441 Chronic obstructive pulmonary disease with (acute) exacerbation: Secondary | ICD-10-CM | POA: Diagnosis not present

## 2017-04-10 DIAGNOSIS — I2510 Atherosclerotic heart disease of native coronary artery without angina pectoris: Secondary | ICD-10-CM | POA: Diagnosis not present

## 2017-04-12 ENCOUNTER — Telehealth: Payer: Self-pay | Admitting: Cardiovascular Disease

## 2017-04-12 NOTE — Telephone Encounter (Signed)
Called pt no answer. Unable to leave msg d/t mailbox being full.  

## 2017-04-12 NOTE — Telephone Encounter (Signed)
lvm stating she's out of her Brilinta that was written from the hospital

## 2017-04-13 MED ORDER — TICAGRELOR 60 MG PO TABS: 60.0000 mg | ORAL_TABLET | Freq: Two times a day (BID) | ORAL | 0 refills | Status: DC

## 2017-04-13 NOTE — Telephone Encounter (Signed)
patient discharge summary on 03/05/17 pt is on both coumadin and brilinta,pt had ran out of brilinta and had [facility] hospital fu visit several times.She made fu apt today with dr Bronson Ing, 1 months suppy brilinta sent until she has f/u apt

## 2017-04-20 ENCOUNTER — Ambulatory Visit (INDEPENDENT_AMBULATORY_CARE_PROVIDER_SITE_OTHER): Payer: Medicare Other | Admitting: Cardiology

## 2017-04-20 DIAGNOSIS — D6852 Prothrombin gene mutation: Secondary | ICD-10-CM | POA: Diagnosis not present

## 2017-04-20 DIAGNOSIS — J9621 Acute and chronic respiratory failure with hypoxia: Secondary | ICD-10-CM | POA: Diagnosis not present

## 2017-04-20 DIAGNOSIS — Z5181 Encounter for therapeutic drug level monitoring: Secondary | ICD-10-CM | POA: Diagnosis not present

## 2017-04-20 DIAGNOSIS — I2510 Atherosclerotic heart disease of native coronary artery without angina pectoris: Secondary | ICD-10-CM | POA: Diagnosis not present

## 2017-04-20 DIAGNOSIS — J441 Chronic obstructive pulmonary disease with (acute) exacerbation: Secondary | ICD-10-CM | POA: Diagnosis not present

## 2017-04-20 DIAGNOSIS — J101 Influenza due to other identified influenza virus with other respiratory manifestations: Secondary | ICD-10-CM | POA: Diagnosis not present

## 2017-04-20 DIAGNOSIS — J9622 Acute and chronic respiratory failure with hypercapnia: Secondary | ICD-10-CM | POA: Diagnosis not present

## 2017-04-20 LAB — POCT INR: INR: 2.3

## 2017-04-20 NOTE — Patient Instructions (Addendum)
Description   Spoke with Kathlee Nations nurse with [facility] and she states she will get in touch with the pt and instruct her to continue coumadin 5mg  daily except 6mg  on Mondays and Thursdays Recheck 1 week Kathlee Nations states she wil discharge pt on this visit on 04/25/2017 Had called pt times 3 with no answer to phone and had left message but Kathlee Nations states she will get in touch with her and give her above dosing

## 2017-04-23 DIAGNOSIS — J441 Chronic obstructive pulmonary disease with (acute) exacerbation: Secondary | ICD-10-CM | POA: Diagnosis not present

## 2017-04-23 DIAGNOSIS — D6852 Prothrombin gene mutation: Secondary | ICD-10-CM | POA: Diagnosis not present

## 2017-04-23 DIAGNOSIS — I2510 Atherosclerotic heart disease of native coronary artery without angina pectoris: Secondary | ICD-10-CM | POA: Diagnosis not present

## 2017-04-23 DIAGNOSIS — J9622 Acute and chronic respiratory failure with hypercapnia: Secondary | ICD-10-CM | POA: Diagnosis not present

## 2017-04-23 DIAGNOSIS — J9621 Acute and chronic respiratory failure with hypoxia: Secondary | ICD-10-CM | POA: Diagnosis not present

## 2017-04-23 DIAGNOSIS — J101 Influenza due to other identified influenza virus with other respiratory manifestations: Secondary | ICD-10-CM | POA: Diagnosis not present

## 2017-04-25 ENCOUNTER — Telehealth: Payer: Self-pay | Admitting: *Deleted

## 2017-04-25 ENCOUNTER — Ambulatory Visit (INDEPENDENT_AMBULATORY_CARE_PROVIDER_SITE_OTHER): Payer: Medicare Other | Admitting: *Deleted

## 2017-04-25 DIAGNOSIS — J441 Chronic obstructive pulmonary disease with (acute) exacerbation: Secondary | ICD-10-CM | POA: Diagnosis not present

## 2017-04-25 DIAGNOSIS — I2510 Atherosclerotic heart disease of native coronary artery without angina pectoris: Secondary | ICD-10-CM | POA: Diagnosis not present

## 2017-04-25 DIAGNOSIS — D6851 Activated protein C resistance: Secondary | ICD-10-CM | POA: Diagnosis not present

## 2017-04-25 DIAGNOSIS — Z5181 Encounter for therapeutic drug level monitoring: Secondary | ICD-10-CM | POA: Diagnosis not present

## 2017-04-25 DIAGNOSIS — J9622 Acute and chronic respiratory failure with hypercapnia: Secondary | ICD-10-CM | POA: Diagnosis not present

## 2017-04-25 DIAGNOSIS — D6852 Prothrombin gene mutation: Secondary | ICD-10-CM | POA: Diagnosis not present

## 2017-04-25 DIAGNOSIS — J9621 Acute and chronic respiratory failure with hypoxia: Secondary | ICD-10-CM | POA: Diagnosis not present

## 2017-04-25 DIAGNOSIS — J101 Influenza due to other identified influenza virus with other respiratory manifestations: Secondary | ICD-10-CM | POA: Diagnosis not present

## 2017-04-25 LAB — POCT INR: INR: 2.4

## 2017-04-25 NOTE — Telephone Encounter (Signed)
INR 2.4 per Kathlee Nations w/ [facility] [phone]. Kathlee Nations is d/c her today and she will need an apt in the office.

## 2017-04-25 NOTE — Patient Instructions (Signed)
Spoke with Kathlee Nations nurse with [facility] and she states she will get in touch with the pt and instruct her to continue coumadin 5mg  daily except 6mg  on Mondays and Thursdays Recheck 3 weeks in office

## 2017-04-25 NOTE — Telephone Encounter (Signed)
Done.  See coumadin note. 

## 2017-05-16 ENCOUNTER — Ambulatory Visit (INDEPENDENT_AMBULATORY_CARE_PROVIDER_SITE_OTHER): Payer: Medicare Other | Admitting: *Deleted

## 2017-05-16 ENCOUNTER — Encounter: Payer: Self-pay | Admitting: Cardiology

## 2017-05-16 ENCOUNTER — Ambulatory Visit (INDEPENDENT_AMBULATORY_CARE_PROVIDER_SITE_OTHER): Payer: Medicare Other | Admitting: Cardiology

## 2017-05-16 VITALS — BP 136/72 | HR 107 | Ht 65.0 in | Wt 159.4 lb

## 2017-05-16 DIAGNOSIS — Z5181 Encounter for therapeutic drug level monitoring: Secondary | ICD-10-CM | POA: Diagnosis not present

## 2017-05-16 DIAGNOSIS — M545 Low back pain, unspecified: Secondary | ICD-10-CM

## 2017-05-16 DIAGNOSIS — J439 Emphysema, unspecified: Secondary | ICD-10-CM

## 2017-05-16 DIAGNOSIS — Z9861 Coronary angioplasty status: Secondary | ICD-10-CM | POA: Diagnosis not present

## 2017-05-16 DIAGNOSIS — D6851 Activated protein C resistance: Secondary | ICD-10-CM

## 2017-05-16 DIAGNOSIS — I2510 Atherosclerotic heart disease of native coronary artery without angina pectoris: Secondary | ICD-10-CM | POA: Diagnosis not present

## 2017-05-16 DIAGNOSIS — G8929 Other chronic pain: Secondary | ICD-10-CM | POA: Diagnosis not present

## 2017-05-16 DIAGNOSIS — Z86711 Personal history of pulmonary embolism: Secondary | ICD-10-CM

## 2017-05-16 DIAGNOSIS — M549 Dorsalgia, unspecified: Secondary | ICD-10-CM

## 2017-05-16 DIAGNOSIS — E782 Mixed hyperlipidemia: Secondary | ICD-10-CM | POA: Diagnosis not present

## 2017-05-16 LAB — POCT INR: INR: 2.1
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
## 2017-05-16 NOTE — Assessment & Plan Note (Signed)
Patient has history of pulmonary embolism associated with deep vein thrombosis and Factor V Leiden. She will require lifelong anti-coagulation

## 2017-05-16 NOTE — Patient Instructions (Signed)
Medication Instructions:  Your physician recommends that you continue on your current medications as directed. Please refer to the Current Medication list given to you today.   Labwork: NONE   Testing/Procedures: NONE   Follow-Up: Your physician wants you to follow-up in: 6 Months with Dr. Koneswaran. You will receive a reminder letter in the mail two months in advance. If you don't receive a letter, please call our office to schedule the follow-up appointment.   Any Other Special Instructions Will Be Listed Below (If Applicable).     If you need a refill on your cardiac medications before your next appointment, please call your pharmacy.  Thank you for choosing [facility]!   

## 2017-05-16 NOTE — Assessment & Plan Note (Signed)
Inf STEMI 2002->RCA stent 2003 in-stent restenosis->cutting balloon+ brachytherapy acute stent thrombosis 06/2010->Zeta stent(BMS)->stent thrombosis->thrombectomy Cath 2013- minor CAD- medical Rx nresponder to Plavix; 03/2011 placed on Brilinta.

## 2017-05-16 NOTE — Patient Instructions (Addendum)
Pt states she has been taking coumadin 5mg  daily except 6mg  on Saturdays Continue above dose Recheck 4 weeks in office Pt pending some new back injection.  Has not been told to hold coumadin.  She will call them tomorrow to find out and let me know.  If she holds coumadin she will need to be bridged with lovenox and be seen in f/u sooner.

## 2017-05-16 NOTE — Assessment & Plan Note (Signed)
Chronic Coumadin 

## 2017-05-16 NOTE — Assessment & Plan Note (Signed)
Ot declined statin Rx- mainly because of finances

## 2017-05-16 NOTE — Assessment & Plan Note (Signed)
Followed by [facility], on chronic O2
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
## 2017-05-17 ENCOUNTER — Telehealth: Payer: Self-pay | Admitting: *Deleted

## 2017-05-17 NOTE — Telephone Encounter (Signed)
Called pt and LM on voice mail to call back or I would call her back on Monday.
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
## 2017-05-22 NOTE — Telephone Encounter (Signed)
Called pt.  No answer. LMOM for pt to call back.

## 2017-06-06 ENCOUNTER — Ambulatory Visit: Payer: Medicare Other | Admitting: Cardiovascular Disease

## 2017-06-11 ENCOUNTER — Other Ambulatory Visit: Payer: Self-pay | Admitting: Cardiovascular Disease

## 2017-06-20 ENCOUNTER — Ambulatory Visit (INDEPENDENT_AMBULATORY_CARE_PROVIDER_SITE_OTHER): Payer: Medicare Other | Admitting: *Deleted

## 2017-06-20 DIAGNOSIS — Z86711 Personal history of pulmonary embolism: Secondary | ICD-10-CM

## 2017-06-20 DIAGNOSIS — D6851 Activated protein C resistance: Secondary | ICD-10-CM

## 2017-06-20 DIAGNOSIS — Z5181 Encounter for therapeutic drug level monitoring: Secondary | ICD-10-CM | POA: Diagnosis not present

## 2017-06-20 LAB — POCT INR: INR: 1.9 — AB (ref 2.0–3.0)

## 2017-06-20 NOTE — Patient Instructions (Signed)
Increase coumadin to 5mg  daily except 6mg  on Mondays and Thursdays Recheck 3 weeks in office

## 2017-07-11 ENCOUNTER — Ambulatory Visit (INDEPENDENT_AMBULATORY_CARE_PROVIDER_SITE_OTHER): Payer: Medicare Other | Admitting: *Deleted

## 2017-07-11 DIAGNOSIS — D6851 Activated protein C resistance: Secondary | ICD-10-CM

## 2017-07-11 DIAGNOSIS — Z86711 Personal history of pulmonary embolism: Secondary | ICD-10-CM

## 2017-07-11 DIAGNOSIS — Z5181 Encounter for therapeutic drug level monitoring: Secondary | ICD-10-CM | POA: Diagnosis not present

## 2017-07-11 LAB — POCT INR: INR: 2.4 (ref 2.0–3.0)

## 2017-07-11 NOTE — Patient Instructions (Signed)
Continue coumadin 5mg  daily except 6mg  on Mondays and Thursdays Recheck 4 weeks in office

## 2017-08-13 ENCOUNTER — Ambulatory Visit (INDEPENDENT_AMBULATORY_CARE_PROVIDER_SITE_OTHER): Payer: Medicare Other | Admitting: Pharmacist

## 2017-08-13 DIAGNOSIS — Z5181 Encounter for therapeutic drug level monitoring: Secondary | ICD-10-CM | POA: Diagnosis not present

## 2017-08-13 DIAGNOSIS — Z86711 Personal history of pulmonary embolism: Secondary | ICD-10-CM

## 2017-08-13 LAB — POCT INR: INR: 2.3 (ref 2.0–3.0)

## 2017-08-13 NOTE — Patient Instructions (Signed)
Description   Continue coumadin 5mg  daily except 6mg  on Mondays and Thursdays Recheck 5 weeks in office

## 2017-08-15 ENCOUNTER — Ambulatory Visit (INDEPENDENT_AMBULATORY_CARE_PROVIDER_SITE_OTHER): Payer: Medicare Other

## 2017-08-15 ENCOUNTER — Encounter: Payer: Self-pay | Admitting: Adult Health

## 2017-08-15 ENCOUNTER — Ambulatory Visit (INDEPENDENT_AMBULATORY_CARE_PROVIDER_SITE_OTHER): Payer: Medicare Other | Admitting: Adult Health

## 2017-08-15 VITALS — BP 120/62 | HR 114 | Temp 98.2°F | Wt 163.0 lb

## 2017-08-15 DIAGNOSIS — Z9861 Coronary angioplasty status: Secondary | ICD-10-CM

## 2017-08-15 DIAGNOSIS — K625 Hemorrhage of anus and rectum: Secondary | ICD-10-CM

## 2017-08-15 DIAGNOSIS — I2510 Atherosclerotic heart disease of native coronary artery without angina pectoris: Secondary | ICD-10-CM

## 2017-08-15 DIAGNOSIS — E039 Hypothyroidism, unspecified: Secondary | ICD-10-CM | POA: Diagnosis not present

## 2017-08-15 DIAGNOSIS — R042 Hemoptysis: Secondary | ICD-10-CM | POA: Diagnosis not present

## 2017-08-15 DIAGNOSIS — R0602 Shortness of breath: Secondary | ICD-10-CM | POA: Diagnosis not present

## 2017-08-15 DIAGNOSIS — F339 Major depressive disorder, recurrent, unspecified: Secondary | ICD-10-CM

## 2017-08-15 LAB — CBC WITH DIFFERENTIAL/PLATELET
Basophils Absolute: 0.1 10*3/uL (ref 0.0–0.1)
Basophils Relative: 0.8 % (ref 0.0–3.0)
EOS PCT: 1.2 % (ref 0.0–5.0)
Eosinophils Absolute: 0.1 10*3/uL (ref 0.0–0.7)
HEMATOCRIT: 36 % (ref 36.0–46.0)
HEMOGLOBIN: 11.8 g/dL — AB (ref 12.0–15.0)
LYMPHS ABS: 1.4 10*3/uL (ref 0.7–4.0)
Lymphocytes Relative: 21.5 % (ref 12.0–46.0)
MCHC: 32.6 g/dL (ref 30.0–36.0)
MCV: 86 fl (ref 78.0–100.0)
MONOS PCT: 13.8 % — AB (ref 3.0–12.0)
Monocytes Absolute: 0.9 10*3/uL (ref 0.1–1.0)
NEUTROS PCT: 62.7 % (ref 43.0–77.0)
Neutro Abs: 4.1 10*3/uL (ref 1.4–7.7)
Platelets: 263 10*3/uL (ref 150.0–400.0)
RBC: 4.19 Mil/uL (ref 3.87–5.11)
RDW: 17 % — ABNORMAL HIGH (ref 11.5–15.5)
WBC: 6.6 10*3/uL (ref 4.0–10.5)

## 2017-08-15 LAB — BASIC METABOLIC PANEL
BUN: 17 mg/dL (ref 6–23)
CALCIUM: 8.9 mg/dL (ref 8.4–10.5)
CO2: 41 meq/L — AB (ref 19–32)
CREATININE: 0.72 mg/dL (ref 0.40–1.20)
Chloride: 100 mEq/L (ref 96–112)
GFR: 85.8 mL/min (ref >60.00)
GLUCOSE: 101 mg/dL — AB (ref 70–99)
Potassium: 4.4 mEq/L (ref 3.5–5.1)
Sodium: 144 mEq/L (ref 135–145)

## 2017-08-15 LAB — TSH: TSH: 3.48 u[IU]/mL (ref 0.35–4.50)

## 2017-08-15 MED ORDER — CITALOPRAM HYDROBROMIDE 10 MG PO TABS: 10.0000 mg | ORAL_TABLET | Freq: Every day | ORAL | 1 refills | Status: DC
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
## 2017-08-16 ENCOUNTER — Other Ambulatory Visit: Payer: Self-pay | Admitting: Adult Health

## 2017-08-16 DIAGNOSIS — E039 Hypothyroidism, unspecified: Secondary | ICD-10-CM

## 2017-08-16 LAB — IRON,TIBC AND FERRITIN PANEL
%SAT: 7 % (calc) — ABNORMAL LOW (ref 16–45)
Ferritin: 20 ng/mL (ref 16–288)
Iron: 26 ug/dL — ABNORMAL LOW (ref 45–160)
TIBC: 371 mcg/dL (calc) (ref 250–450)

## 2017-08-16 MED ORDER — LEVOTHYROXINE SODIUM 125 MCG PO TABS: 125.0000 ug | ORAL_TABLET | Freq: Every day | ORAL | 3 refills | Status: DC

## 2017-08-17 ENCOUNTER — Telehealth: Payer: Self-pay | Admitting: Adult Health

## 2017-08-17 ENCOUNTER — Encounter: Payer: Self-pay | Admitting: Gastroenterology

## 2017-08-17 NOTE — Telephone Encounter (Signed)
Copied from [place] [id number]. Topic: Quick Communication - Lab Results >> Aug 16, 2017 10:51 AM Miles Costain T, CMA wrote: Called patient to inform them of all lab results 08/15/17. When patient returns call, triage nurse may disclose results.   Please call CB# [phone]

## 2017-08-17 NOTE — Telephone Encounter (Signed)
Copied from [place] [id number]. Topic: Quick Communication - See Telephone Encounter >> Aug 17, 2017 10:27 AM Synthia Innocent wrote: CRM for notification. See Telephone encounter for: 08/17/17. Patient was told to take OTC supplement of Iron was not given the strength to take. Spoke with pharmacy they suggest she call us to obtain correct dosage to take. Please advise.

## 2017-08-17 NOTE — Telephone Encounter (Signed)
Left a message for a return call.

## 2017-08-17 NOTE — Telephone Encounter (Signed)
Charted in result notes. 

## 2017-08-17 NOTE — Telephone Encounter (Signed)
I spoke with pt and gave the recommended dosage amount.

## 2017-08-17 NOTE — Telephone Encounter (Signed)
65 mg

## 2017-08-23 ENCOUNTER — Telehealth: Payer: Self-pay | Admitting: Family Medicine

## 2017-08-23 ENCOUNTER — Other Ambulatory Visit: Payer: Self-pay | Admitting: Adult Health

## 2017-08-23 MED ORDER — FUROSEMIDE 20 MG PO TABS: 20.0000 mg | ORAL_TABLET | Freq: Every day | ORAL | 0 refills | Status: DC

## 2017-08-23 NOTE — Telephone Encounter (Signed)
Spoke to the pt.  She is now scheduled for follow up and notified that Rx has been sent to the pharmacy.

## 2017-08-23 NOTE — Telephone Encounter (Signed)
Copied from [place] [phone]. Topic: General - Other >> Aug 23, 2017 11:02 AM Yvette Rack wrote: Reason for CRM: Pt states she was under the impression that a Rx for Lasik would be sent to the pharmacy but she is at the pharmacy and no Rx was sent. Pt requests a call back as soon as possible. Cb# [phone]

## 2017-08-30 ENCOUNTER — Ambulatory Visit (INDEPENDENT_AMBULATORY_CARE_PROVIDER_SITE_OTHER): Payer: Medicare Other

## 2017-08-30 ENCOUNTER — Ambulatory Visit (INDEPENDENT_AMBULATORY_CARE_PROVIDER_SITE_OTHER): Payer: Medicare Other | Admitting: Adult Health

## 2017-08-30 ENCOUNTER — Encounter: Payer: Self-pay | Admitting: Adult Health

## 2017-08-30 VITALS — BP 122/64 | HR 92 | Temp 98.7°F | Wt 161.5 lb

## 2017-08-30 DIAGNOSIS — R0602 Shortness of breath: Secondary | ICD-10-CM

## 2017-08-30 DIAGNOSIS — R05 Cough: Secondary | ICD-10-CM | POA: Diagnosis not present

## 2017-08-30 DIAGNOSIS — I2510 Atherosclerotic heart disease of native coronary artery without angina pectoris: Secondary | ICD-10-CM

## 2017-08-30 DIAGNOSIS — Z9861 Coronary angioplasty status: Secondary | ICD-10-CM

## 2017-08-30 DIAGNOSIS — R0600 Dyspnea, unspecified: Secondary | ICD-10-CM | POA: Diagnosis not present

## 2017-08-30 DIAGNOSIS — R059 Cough, unspecified: Secondary | ICD-10-CM

## 2017-08-30 MED ORDER — BENZONATATE 100 MG PO CAPS: 100.0000 mg | ORAL_CAPSULE | Freq: Two times a day (BID) | ORAL | 1 refills | Status: DC | PRN
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
## 2017-08-31 LAB — BASIC METABOLIC PANEL
BUN: 20 mg/dL (ref 6–23)
CHLORIDE: 98 meq/L (ref 96–112)
CO2: 39 mEq/L — ABNORMAL HIGH (ref 19–32)
Calcium: 9.2 mg/dL (ref 8.4–10.5)
Creatinine, Ser: 0.85 mg/dL (ref 0.40–1.20)
GFR: 70.83 mL/min (ref >60.00)
GLUCOSE: 94 mg/dL (ref 70–99)
POTASSIUM: 4 meq/L (ref 3.5–5.1)
Sodium: 141 mEq/L (ref 135–145)

## 2017-09-05 ENCOUNTER — Encounter: Payer: Self-pay | Admitting: Gastroenterology

## 2017-09-05 ENCOUNTER — Ambulatory Visit (INDEPENDENT_AMBULATORY_CARE_PROVIDER_SITE_OTHER): Payer: Medicare Other | Admitting: Gastroenterology

## 2017-09-05 VITALS — BP 108/56 | HR 100 | Ht 65.0 in | Wt 166.2 lb

## 2017-09-05 DIAGNOSIS — Z7901 Long term (current) use of anticoagulants: Secondary | ICD-10-CM

## 2017-09-05 DIAGNOSIS — Z9861 Coronary angioplasty status: Secondary | ICD-10-CM | POA: Diagnosis not present

## 2017-09-05 DIAGNOSIS — R1032 Left lower quadrant pain: Secondary | ICD-10-CM | POA: Insufficient documentation

## 2017-09-05 DIAGNOSIS — K625 Hemorrhage of anus and rectum: Secondary | ICD-10-CM

## 2017-09-05 DIAGNOSIS — Z9981 Dependence on supplemental oxygen: Secondary | ICD-10-CM | POA: Diagnosis not present

## 2017-09-05 DIAGNOSIS — I2510 Atherosclerotic heart disease of native coronary artery without angina pectoris: Secondary | ICD-10-CM | POA: Diagnosis not present

## 2017-09-05 NOTE — Progress Notes (Addendum)
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  She is here today at the request of her PCP, Sallee Provencal, NP, for evaluation regarding rectal bleeding and abdominal pain.  She is a rather poor and brief historian so does not provide much information regarding her symptoms.  Complains of intermittent rectal bleeding and intermittent abdominal pain.  Apparently the pain usually occurs on the left side, but sometimes on the right as well.  Says that she hears a lot of noise in her abdomen.  Says that her bowels alternate from loose stools to constipation to normal stools.  The bleeding is described as bright red in color and usually occurs with bowel movements.  Last bleeding that she saw was 2 weeks ago.  No abdominal pain currently.  TSH normal.  Hgb 11.8 grams.  Iron levels borderline low.  Her last colonoscopy was in December 2004 which time the study was normal, but she did have a Cologuard in September 2018 that was normal/negative.   Past Medical History:  Diagnosis Date  . Acute MI ([place])    x4, code blue x3  . Arteriosclerotic cardiovascular disease (ASCVD) 2002   Inf STEMI-2002. 2003-cutting balloon + brachytherapy for restenosis; subsequent acute stent thrombosis 06/2010 requiring 2 separate interventions (Zeta stent, then repeat cath with thrombectomy). focal basal inf AK, nl EF; 03/2011: Patent stents, minor nonobst  residual dz, nl EF; neg stress nuclear in 2008 and stress echo in 2009  . Chronic anticoagulation    Warfarin plus ticagrelor  . Chronic respiratory failure ([place]) 08/27/2013   On 2L 02  . COPD (chronic obstructive pulmonary disease) ([place])    02 dependent  . COPD (chronic obstructive pulmonary disease) ([place])   . Factor 5 Leiden mutation, heterozygous ([place])   . Factor V Leiden, prothrombin gene mutation ([place]) 2006  . Hyperlipidemia   . Hypothyroidism   . Noncompliance   . Pelvic fracture ([place]) 2009  . Pulmonary embolism ([place]) 2006   Associated with deep vein thrombosis-2006; + factor V Leiden  . Tobacco abuse    50 pack years   Past Surgical History:  Procedure Laterality Date  . COLONOSCOPY  Approximately 2000   Negative screening study  . CORONARY ANGIOPLASTY  2002, 2003, 2012  . LEFT AND RIGHT HEART CATHETERIZATION WITH CORONARY ANGIOGRAM N/A 04/03/2011   Procedure: LEFT AND RIGHT HEART CATHETERIZATION WITH CORONARY ANGIOGRAM;  Surgeon: Sherren Mocha, MD;  Location: [facility] CATH LAB;  Service: Cardiovascular;  Laterality: N/A;    reports that she has been smoking cigarettes. She started smoking about 49 years ago. She has a 25.00 pack-year smoking history. She has never used smokeless tobacco. She reports that she does not drink alcohol or use drugs. family history includes Alzheimer's disease in her mother; Emphysema in her mother and sister; Factor V Leiden deficiency in her daughter, father, and sister; Heart disease in her father; Kidney cancer in her paternal grandmother. Allergies  Allergen Reactions  . Dilaudid [Hydromorphone Hcl] Hives and Nausea Only  . Minocycline Hcl     REACTION: Dizzy  . Prednisone     REACTION: feels  like throat swelling  . Varenicline Tartrate     REACTION: Dizzy(chantix)   . Zocor [Simvastatin - High Dose] Other (See Comments)    myalgia      Outpatient Encounter Medications as of 09/05/2017  Medication Sig  . albuterol (PROVENTIL) (2.5 MG/3ML) 0.083% nebulizer solution USE 1 VIAL BY NEBULIZER EVERY 4 HOURS AS NEEDED FOR WHEEZING. DX: J44.9  . benzonatate (TESSALON) 100 MG capsule Take 1 capsule (100 mg total) by mouth  2 (two) times daily as needed for cough.  . BRILINTA 60 MG TABS tablet TAKE 1 TABLET(60 MG) BY MOUTH TWICE DAILY  . budesonide (PULMICORT) 0.5 MG/2ML nebulizer solution Take 2 mLs (0.5 mg total) by nebulization 2 (two) times daily. Dx: J43.9  . fentaNYL (DURAGESIC - DOSED MCG/HR) 50 MCG/HR Place 50 mcg onto the skin every 3 (three) days.  . ferrous sulfate 325 (65 FE) MG tablet Take 325 mg by mouth daily with breakfast.  . furosemide (LASIX) 20 MG tablet Take 1 tablet (20 mg total) by mouth daily. Follow up next week.  [name] ipratropium-albuterol (DUONEB) 0.5-2.5 (3) MG/3ML SOLN Take 3 mLs by nebulization 4 (four) times daily.  [name] levothyroxine (SYNTHROID, LEVOTHROID) 125 MCG tablet Take 1 tablet (125 mcg total) by mouth daily.  . nitroGLYCERIN (NITROSTAT) 0.4 MG SL tablet Place 1 tablet (0.4 mg total) under the tongue every 5 (five) minutes as needed for chest pain.  . Omega-3 Fatty Acids (FISH OIL) 600 MG CAPS Take 1 capsule by mouth 3 (three) times daily.  . OXYGEN Inhale 3 L into the lungs continuous.   [name] Respiratory Therapy Supplies (FLUTTER) DEVI Use as directed  . traZODone (DESYREL) 50 MG tablet Take 50 mg by mouth at bedtime as needed for sleep.  [name] warfarin (COUMADIN) 1 MG tablet Take 1 tablet (1mg ) on Tuesdays and Saturdays along with a 5mg  tablet (Patient taking differently: Take 1 mg by mouth 2 (two) times a week. Take 1 tablet (1mg ) on Tuesdays and Fridays along with a 5mg  tablet)  . warfarin (COUMADIN) 5 MG tablet Take 1 tablet (5 mg total) by mouth daily. (Patient taking differently: Take 5-6 mg by mouth daily. 6mg  on Saturdays, 5mg  on Sunday, Monday, 6mg  on Tuesdays, 5mg  on Wednesdays, 6mg  on Thursdays, and 5mg  on Fridays.)  . [DISCONTINUED] fish oil-omega-3 fatty acids 1000 MG capsule Take 2 g by mouth daily.     No facility-administered encounter medications on file as of 09/05/2017.      REVIEW OF SYSTEMS  : All other systems reviewed and negative except where noted in the History  of Present Illness.   PHYSICAL EXAM: BP (!) 108/56   Pulse 100   Ht 5\' 5"  (1.651 m)   Wt 166 lb 4 oz (75.4 kg)   BMI 27.67 kg/m  General: Well developed white female in no acute distress; on O2. Unkept. Head: Normocephalic and atraumatic Eyes:  Sclerae anicteric, conjunctiva pink. Ears: Normal auditory acuity Lungs: Wheezing noted throughout and had some distress and tachypnea when lying on the table. Heart: Regular rate and rhythm Abdomen: Soft, non-distended.  BS present.  Non-tender. Musculoskeletal: Symmetrical with no gross deformities  Skin: No lesions on visible extremities Extremities: No edema  Neurological: Alert oriented x 4, grossly non-focal Psychological:  Alert and cooperative. Normal mood and affect  ASSESSMENT AND PLAN: *Rectal bleeding and abdominal pain (mostly LLQ and sometimes RLQ): Patient was a poor historian.  It seems that both the bleeding and pain are intermittent.  She had a  negative Cologuard in September 2018, but last colonoscopy was in 2004 at which time it was normal.  She is on 3 L of oxygen which she just increased on her own.  She is on both Coumadin and Brilinta for history of DVT/PE and Factor 5 Leiden and CAD.  We discussed colonoscopy, but she does not have a ride/transportation to get down here for her procedure (lives in [place]); I did advise her that there are GI physicians in that area as well.  With all of her chronic medical issues she is not a great candidate for procedure and is at significantly increased risk.  We will get a CT scan of the abdomen pelvis with contrast.  If that is unremarkable and then also negative Cologuard last year then hopefully with those 2 studies we can have ruled out any significant issues and can treat her symptoms conservatively such as with hemorrhoid treatment, etc.   CC:  Nafziger, Tommi Rumps, NP  Addendum: Reviewed and agree with initial management. Pyrtle, Lajuan Lines, MD

## 2017-09-05 NOTE — Patient Instructions (Signed)
You have been scheduled for a CT scan of the abdomen and pelvis at [facility].   You are scheduled on 09-20-17 at 2:00 pm. You should arrive 15 minutes prior to your appointment time for registration. Please follow the written instructions below on the day of your exam:  WARNING: IF YOU ARE ALLERGIC TO IODINE/X-RAY DYE, PLEASE NOTIFY RADIOLOGY IMMEDIATELY AT [phone]! YOU WILL BE GIVEN A 13 HOUR PREMEDICATION PREP.  1) Do not eat or drink anything after 10:00 am (4 hours prior to your test) 2) You have been given 2 bottles of oral contrast to drink. The solution may taste better if refrigerated, but do NOT add ice or any other liquid to this solution. Shake well before drinking.    Drink 1 bottle of contrast @ 12:00 pm (2 hours prior to your exam)  Drink 1 bottle of contrast @ 1:00 pm (1 hour prior to your exam)  You may take any medications as prescribed with a small amount of water except for the following: Metformin, Glucophage, Glucovance, Avandamet, Riomet, Fortamet, Actoplus Met, Janumet, Glumetza or Metaglip. The above medications must be held the day of the exam AND 48 hours after the exam.  The purpose of you drinking the oral contrast is to aid in the visualization of your intestinal tract. The contrast solution may cause some diarrhea. Before your exam is started, you will be given a small amount of fluid to drink. Depending on your individual set of symptoms, you may also receive an intravenous injection of x-ray contrast/dye. Plan on being at [facility] for 30 minutes or longer, depending on the type of exam you are having performed.  This test typically takes 30-45 minutes to complete.  If you have any questions regarding your exam or if you need to reschedule, you may call the CT department at [phone] between the hours of 8:00 am and 5:00 pm, Monday-Friday.  ________________________________________________________________________

## 2017-09-07 ENCOUNTER — Encounter: Payer: Self-pay | Admitting: Pulmonary Disease

## 2017-09-07 ENCOUNTER — Other Ambulatory Visit (INDEPENDENT_AMBULATORY_CARE_PROVIDER_SITE_OTHER): Payer: Medicare Other

## 2017-09-07 ENCOUNTER — Ambulatory Visit (INDEPENDENT_AMBULATORY_CARE_PROVIDER_SITE_OTHER): Payer: Medicare Other | Admitting: Pulmonary Disease

## 2017-09-07 VITALS — BP 132/70 | HR 101 | Ht 64.0 in | Wt 168.2 lb

## 2017-09-07 DIAGNOSIS — R042 Hemoptysis: Secondary | ICD-10-CM

## 2017-09-07 DIAGNOSIS — I2510 Atherosclerotic heart disease of native coronary artery without angina pectoris: Secondary | ICD-10-CM

## 2017-09-07 DIAGNOSIS — Z9861 Coronary angioplasty status: Secondary | ICD-10-CM

## 2017-09-07 DIAGNOSIS — Z23 Encounter for immunization: Secondary | ICD-10-CM

## 2017-09-07 LAB — PROTIME-INR
INR: 1.6 ratio — AB (ref 0.8–1.0)
PROTHROMBIN TIME: 19.1 s — AB (ref 9.6–13.1)

## 2017-09-07 MED ORDER — ARFORMOTEROL TARTRATE 15 MCG/2ML IN NEBU: 15.0000 ug | INHALATION_SOLUTION | Freq: Two times a day (BID) | RESPIRATORY_TRACT | 6 refills | Status: DC

## 2017-09-07 NOTE — Patient Instructions (Signed)
Epistaxis (nosebleeding): Start using saline gel in each nostril daily We will write a prescription for a saline humidifier for your oxygen  Chronic respiratory failure with hypoxemia: Keep using 2 L of oxygen continuously  Severe COPD with emphysema: Start taking Brovana twice a day Continue Pulmicort twice a day Continue DuoNeb throughout the course the day as needed for chest tightness wheezing or shortness of breath High-dose flu shot Quit smoking Practice good hand hygiene  Coughing up blood: We will arrange for a CT scan of your chest: Please let me know if this improves with treatment of the nosebleeding, however if you are still coughing up blood in 2 weeks I need to know so that we can arrange for bronchoscopy  History of pulmonary embolism: We will check a PT/INR today because you have been coughing up blood We will arrange for a VQ scan to evaluate for chronic thromboembolic disease of the lungs  We will see you back in 3 to 4 weeks or sooner if needed

## 2017-09-07 NOTE — Progress Notes (Addendum)
Synopsis: Former patient of Dr. Ashok Cordia with severe COPD, pulmonary embolism, factor V Leidin and CAD.  She has been smoking since age 67, was still smoking 1/2-1 ppd in 08/2017.  She has had multiple blood clots in her lungs since the 1990's.  She has been on blood thinners (warfarin) since 2006.    Subjective:   PATIENT ID: [name] GENDER: female DOB: [birth year], MRN: [id number]   HPI  Chief Complaint  Patient presents with  . Follow-up    breathing better but still "sucks"   [name] says that she coughs up blood a lot.  She has some times when she will cough up mucus without blood, but most of the time lately she has been coughing up blood.  THis started about a month ago.  She has some epistaxis as well.  This is not a new problem.  She says that sometimes she will have clots in her nose.  She has been losing weight too.  She has worsening dypnea.  She has lost 40 pounds unintentionally in the last 18 months.    She is taking her coumadin routinely.  She says that she thinks her numbers have been normal but it has not been checked recently.  She is taking budesonide twice a day and DuoNeb during the day.  However, she says that Medicare will not pay for anything "handheld".  She uses her oxygen regularly.  She does not use any sort of humidifier with it.  Past Medical History:  Diagnosis Date  . Acute MI ([place])    x4, code blue x3  . Arteriosclerotic cardiovascular disease (ASCVD) 2002   Inf STEMI-2002. 2003-cutting balloon + brachytherapy for restenosis; subsequent acute stent thrombosis 06/2010 requiring 2 separate interventions (Zeta stent, then repeat cath with thrombectomy). focal basal inf AK, nl EF; 03/2011: Patent stents, minor nonobst  residual dz, nl EF; neg stress nuclear in 2008 and stress echo in 2009  . Chronic anticoagulation    Warfarin plus ticagrelor  . Chronic respiratory failure ([place]) 08/27/2013   On 2L 02  . COPD (chronic obstructive pulmonary disease) ([place])     02 dependent  . COPD (chronic obstructive pulmonary disease) ([place])   . Factor 5 Leiden mutation, heterozygous ([place])   . Factor V Leiden, prothrombin gene mutation ([place]) 2006  . Hyperlipidemia   . Hypothyroidism   . Noncompliance   . Pelvic fracture ([place]) 2009  . Pulmonary embolism ([place]) 2006   Associated with deep vein thrombosis-2006; + factor V Leiden  . Tobacco abuse    50 pack years     Family History  Problem Relation Age of Onset  . Emphysema Mother   . Alzheimer's disease Mother   . Emphysema Sister   . Heart disease Father   . Factor V Leiden deficiency Father   . Factor V Leiden deficiency Sister   . Factor V Leiden deficiency Daughter   . Kidney cancer Paternal Grandmother      Social History   Socioeconomic History  . Marital status: Single    Spouse name: Not on file  . Number of children: Not on file  . Years of education: Not on file  . Highest education level: Not on file  Occupational History  . Occupation: Disabled  . 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 Financial resource strain: Not on file  . Food insecurity:    Worry: Not on file    Inability: Not on file  . Transportation needs:  Medical: Not on file    Non-medical: Not on file  Tobacco Use  . Smoking status: Current Some Day Smoker    Packs/day: 0.50    Years: 50.00    Pack years: 25.00    Types: Cigarettes    Start date: 05/10/1968  . Smokeless tobacco: Never Used  Substance and Sexual Activity  . Alcohol use: No    Alcohol/week: 0.0 standard drinks  . Drug use: No  . Sexual activity: Not on file  Lifestyle  . Physical activity:    Days per week: Not on file    Minutes per session: Not on file  . Stress: Not on file  Relationships  . Social connections:    Talks on phone: Not on file    Gets together: Not on file    Attends religious service: Not on file    Active member of club or organization: Not on file    Attends meetings of clubs or organizations: Not on file      Relationship status: Not on file  . Intimate partner violence:    Fear of current or ex partner: Not on file    Emotionally abused: Not on file    Physically abused: Not on file    Forced sexual activity: Not on file  Other Topics Concern  . Not on file  Social History Narrative   Originally from [place]. Previously has lived in [place], [place], [place] & moved back to [place] in 1994. 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 Ronie Spies was in her current home. She also had an owl living in her house as a rehab pet. She also had chickens until Fall 2015. No mold exposure.      Allergies  Allergen Reactions  . Dilaudid [Hydromorphone Hcl] Hives and Nausea Only  . Minocycline Hcl     REACTION: Dizzy  . Prednisone     REACTION: feels like throat swelling, hallucinations  . Varenicline Tartrate     REACTION: Dizzy(chantix)   . Zocor [Simvastatin - High Dose] Other (See Comments)    myalgia     Outpatient Medications Prior to Visit  Medication Sig Dispense Refill  . albuterol (PROVENTIL) (2.5 MG/3ML) 0.083% nebulizer solution USE 1 VIAL BY NEBULIZER EVERY 4 HOURS AS NEEDED FOR WHEEZING. DX: J44.9 375 vial 0  . benzonatate (TESSALON) 100 MG capsule Take 1 capsule (100 mg total) by mouth 2 (two) times daily as needed for cough. 20 capsule 1  . BRILINTA 60 MG TABS tablet TAKE 1 TABLET(60 MG) BY MOUTH TWICE DAILY 60 tablet 6  . budesonide (PULMICORT) 0.5 MG/2ML nebulizer solution Take 2 mLs (0.5 mg total) by nebulization 2 (two) times daily. Dx: J43.9 120 mL 3  . fentaNYL (DURAGESIC - DOSED MCG/HR) 50 MCG/HR Place 50 mcg onto the skin every 3 (three) days.    . ferrous sulfate 325 (65 FE) MG tablet Take 325 mg by mouth daily with breakfast.    . ipratropium-albuterol (DUONEB) 0.5-2.5 (3) MG/3ML SOLN Take 3 mLs by nebulization 4 (four) times daily. 360 mL 3  . levothyroxine (SYNTHROID, LEVOTHROID) 125 MCG tablet Take 1 tablet (125 mcg  total) by mouth daily. 90 tablet 3  . nitroGLYCERIN (NITROSTAT) 0.4 MG SL tablet Place 1 tablet (0.4 mg total) under the tongue every 5 (five) minutes as needed for chest pain. 25 tablet 3  . Omega-3 Fatty Acids (FISH OIL) 600 MG  CAPS Take 1 capsule by mouth 3 (three) times daily.    . OXYGEN Inhale 3 L into the lungs continuous.     [name] Respiratory Therapy Supplies (FLUTTER) DEVI Use as directed 1 each 0  . traZODone (DESYREL) 50 MG tablet Take 50 mg by mouth at bedtime as needed for sleep.    [name] warfarin (COUMADIN) 1 MG tablet Take 1 tablet (1mg ) on Tuesdays and Saturdays along with a 5mg  tablet (Patient taking differently: Take 1 mg by mouth 2 (two) times a week. Take 1 tablet (1mg ) on Tuesdays and Fridays along with a 5mg  tablet) 30 tablet 4  . warfarin (COUMADIN) 5 MG tablet Take 1 tablet (5 mg total) by mouth daily. (Patient taking differently: Take 5-6 mg by mouth daily. 6mg  on Saturdays, 5mg  on Sunday, Monday, 6mg  on Tuesdays, 5mg  on Wednesdays, 6mg  on Thursdays, and 5mg  on Fridays.) 90 tablet 3  . furosemide (LASIX) 20 MG tablet Take 1 tablet (20 mg total) by mouth daily. Follow up next week. (Patient not taking: Reported on 09/07/2017) 30 tablet 0   No facility-administered medications prior to visit.     Review of Systems  Constitutional: Positive for malaise/fatigue and weight loss. Negative for diaphoresis.  HENT: Positive for nosebleeds. Negative for congestion and ear discharge.   Respiratory: Positive for cough, hemoptysis, sputum production, shortness of breath and wheezing.   Cardiovascular: Positive for leg swelling. Negative for chest pain and claudication.      Objective:  Physical Exam   Vitals:   09/07/17 1028  BP: 132/70  Pulse: (!) 101  SpO2: 98%  Weight: 168 lb 3.2 oz (76.3 kg)  Height: 5\' 4"  (1.626 m)   2L [place]  Gen: chronically ill appearing HENT: OP clear, TM's clear, neck supple PULM: Poor air movement B, normal percussion CV: RRR, no mgr, significant  leg edema GI: BS+, soft, nontender Derm: no cyanosis or rash Psyche: normal mood and affect   CBC    Component Value Date/Time   WBC 6.6 08/15/2017 1409   RBC 4.19 08/15/2017 1409   HGB 11.8 (L) 08/15/2017 1409   HCT 36.0 08/15/2017 1409   PLT 263.0 08/15/2017 1409   MCV 86.0 08/15/2017 1409   MCH 28.3 03/03/2017 0459   MCHC 32.6 08/15/2017 1409   RDW 17.0 (H) 08/15/2017 1409   LYMPHSABS 1.4 08/15/2017 1409   MONOABS 0.9 08/15/2017 1409   EOSABS 0.1 08/15/2017 1409   BASOSABS 0.1 08/15/2017 1409     PFT 03/14/16: FVC 1.20 L (35%) FEV1 0.51 L (90%) FEV1/FVC 0.42 FEF 25-75 0.23 L (10%) positive bronchodilator response 11/26/14: FVC 0.91 L (26%) FEV1 0.45 L (17%) FEV1/FVC 0.49 FEF 25-75 0.18 L (7%) positive bronchodilator response                                                                   DLCO uncorrected 22% 07/05/11: FVC 1.38 L (42%) FEV1 0.68 L (28%) FEV1/FVC 0.49 FEF 25-75 0.20 L (7%) negative bronchodilator response TLC 4.37 L (83%) RV 142% ERV 61% DLCO uncorrected 39% 05/31/04: FVC 2.14 L (62%) FEV1 1.23 L (47%) FEV1/FVC 0.57 FEF 25-75 0.56 L (19%) negative bronchodilator response TLC 4.78 L (89%) RV 135% ERV 12% DLCO UNcorrected 65%  6MWT 11/26/14:  Walked 48  meters / Baseline Sat 90% on RA / Nadir Sat 88% on RA (heart rate was irregular & pt ambulated for <56min due to dyspnea)  IMAGING February 2019 CT angiogram chest images independently reviewed showing moderate to severe emphysematous changes throughout both lungs, no pulmonary embolism. CTA CHEST 08/25/13 (per radiologist): No pulmonary embolus. Emphysematous & bronchitic changes. Chronic atelectasis versus scarring left lower lobe. Patchy segmental infiltrate in the posterior right lower lobe. No pleural effusion. Small pericardial effusion.  CARDIAC February 2019 echocardiogram grade 2 diastolic dysfunction with normal LVEF, no comment on pulmonary artery pressures. TTE (04/29/13): Mild LVH. EF 50-55 %.  grade 1 diastolic dysfunction. Akinesis of basal-mid inferior myocardium. LA & RA normal in size. RV normal in size and function. No aortic stenosis or regurgitation. Trivial mitral regurgitation. Trivial tricuspid regurgitation. IVC normal in size. Small pericardial effusion.    February 2019 hospitalization reviewed where she had a COPD exacerbation due to influenza A.     Assessment & Plan:   No diagnosis found.  Discussion: 67 year old female comes to my clinic today to establish care for her centrilobular emphysema which is severe as well as multiple recurrent pulmonary embolisms.  Alarmingly, she tells me today that she has 1 month of hemoptysis as well as epistaxis.  It sounds as if the epistaxis is more significant than the hemoptysis.  I worry that this may be the cause of hemoptysis but given her smoking history we cannot ignore this.  I think the epistaxis is due to the fact that she uses oxygen without humidification.  Plan: Epistaxis (nosebleeding): Start using saline gel in each nostril daily We will write a prescription for a saline humidifier for your oxygen  Chronic respiratory failure with hypoxemia: Keep using 2 L of oxygen continuously  Severe COPD with emphysema: Start taking Brovana twice a day Continue Pulmicort twice a day Continue DuoNeb throughout the course the day as needed for chest tightness wheezing or shortness of breath High-dose flu shot Quit smoking Practice good hand hygiene  Coughing up blood: We will arrange for a CT scan of your chest: Please let me know if this improves with treatment of the nosebleeding, however if you are still coughing up blood in 2 weeks I need to know so that we can arrange for bronchoscopy  History of pulmonary embolism: We will check a PT/INR today because you have been coughing up blood We will arrange for a VQ scan to evaluate for chronic thromboembolic disease of the lungs  We will see you back in 3 to 4 weeks  or sooner if needed    Current Outpatient Medications:  .  albuterol (PROVENTIL) (2.5 MG/3ML) 0.083% nebulizer solution, USE 1 VIAL BY NEBULIZER EVERY 4 HOURS AS NEEDED FOR WHEEZING. DX: J44.9, Disp: 375 vial, Rfl: 0 .  benzonatate (TESSALON) 100 MG capsule, Take 1 capsule (100 mg total) by mouth 2 (two) times daily as needed for cough., Disp: 20 capsule, Rfl: 1 .  BRILINTA 60 MG TABS tablet, TAKE 1 TABLET(60 MG) BY MOUTH TWICE DAILY, Disp: 60 tablet, Rfl: 6 .  budesonide (PULMICORT) 0.5 MG/2ML nebulizer solution, Take 2 mLs (0.5 mg total) by nebulization 2 (two) times daily. Dx: J43.9, Disp: 120 mL, Rfl: 3 .  fentaNYL (DURAGESIC - DOSED MCG/HR) 50 MCG/HR, Place 50 mcg onto the skin every 3 (three) days., Disp: , Rfl:  .  ferrous sulfate 325 (65 FE) MG tablet, Take 325 mg by mouth daily with breakfast., Disp: , Rfl:  .  ipratropium-albuterol (DUONEB) 0.5-2.5 (3) MG/3ML SOLN, Take 3 mLs by nebulization 4 (four) times daily., Disp: 360 mL, Rfl: 3 .  levothyroxine (SYNTHROID, LEVOTHROID) 125 MCG tablet, Take 1 tablet (125 mcg total) by mouth daily., Disp: 90 tablet, Rfl: 3 .  nitroGLYCERIN (NITROSTAT) 0.4 MG SL tablet, Place 1 tablet (0.4 mg total) under the tongue every 5 (five) minutes as needed for chest pain., Disp: 25 tablet, Rfl: 3 .  Omega-3 Fatty Acids (FISH OIL) 600 MG CAPS, Take 1 capsule by mouth 3 (three) times daily., Disp: , Rfl:  .  OXYGEN, Inhale 3 L into the lungs continuous. , Disp: , Rfl:  .  Respiratory Therapy Supplies (FLUTTER) DEVI, Use as directed, Disp: 1 each, Rfl: 0 .  traZODone (DESYREL) 50 MG tablet, Take 50 mg by mouth at bedtime as needed for sleep., Disp: , Rfl:  .  warfarin (COUMADIN) 1 MG tablet, Take 1 tablet (1mg ) on Tuesdays and Saturdays along with a 5mg  tablet (Patient taking differently: Take 1 mg by mouth 2 (two) times a week. Take 1 tablet (1mg ) on Tuesdays and Fridays along with a 5mg  tablet), Disp: 30 tablet, Rfl: 4 .  warfarin (COUMADIN) 5 MG tablet, Take  1 tablet (5 mg total) by mouth daily. (Patient taking differently: Take 5-6 mg by mouth daily. 6mg  on Saturdays, 5mg  on Sunday, Monday, 6mg  on Tuesdays, 5mg  on Wednesdays, 6mg  on Thursdays, and 5mg  on Fridays.), Disp: 90 tablet, Rfl: 3

## 2017-09-11 ENCOUNTER — Telehealth: Payer: Self-pay | Admitting: Pulmonary Disease

## 2017-09-12 ENCOUNTER — Encounter (HOSPITAL_COMMUNITY): Admission: RE | Admit: 2017-09-12 | Payer: Medicare Other | Source: Ambulatory Visit

## 2017-09-14 ENCOUNTER — Encounter (HOSPITAL_COMMUNITY): Admission: RE | Admit: 2017-09-14 | Payer: Medicare Other | Source: Ambulatory Visit

## 2017-09-14 NOTE — Telephone Encounter (Signed)
[name] please advise if encounter can be closed. Thanks

## 2017-09-18 ENCOUNTER — Ambulatory Visit: Payer: Medicare Other | Admitting: Adult Health

## 2017-09-19 ENCOUNTER — Ambulatory Visit (INDEPENDENT_AMBULATORY_CARE_PROVIDER_SITE_OTHER): Payer: Medicare Other | Admitting: *Deleted

## 2017-09-19 ENCOUNTER — Encounter (HOSPITAL_COMMUNITY)
Admission: RE | Admit: 2017-09-19 | Discharge: 2017-09-19 | Disposition: A | Discharge status: Home / Self Care | Payer: Medicare Other | Source: Ambulatory Visit | Attending: Pulmonary Disease | Admitting: Pulmonary Disease

## 2017-09-19 ENCOUNTER — Ambulatory Visit (HOSPITAL_COMMUNITY)
Admission: RE | Admit: 2017-09-19 | Discharge: 2017-09-19 | Disposition: A | Discharge status: Home / Self Care | Payer: Medicare Other | Source: Ambulatory Visit | Attending: Pulmonary Disease | Admitting: Pulmonary Disease

## 2017-09-19 DIAGNOSIS — I4891 Unspecified atrial fibrillation: Secondary | ICD-10-CM | POA: Diagnosis not present

## 2017-09-19 DIAGNOSIS — Z5181 Encounter for therapeutic drug level monitoring: Secondary | ICD-10-CM

## 2017-09-19 DIAGNOSIS — Z955 Presence of coronary angioplasty implant and graft: Secondary | ICD-10-CM | POA: Insufficient documentation

## 2017-09-19 DIAGNOSIS — D6851 Activated protein C resistance: Secondary | ICD-10-CM | POA: Diagnosis not present

## 2017-09-19 DIAGNOSIS — R042 Hemoptysis: Secondary | ICD-10-CM | POA: Diagnosis not present

## 2017-09-19 DIAGNOSIS — I700 Atherosclerosis of aorta: Secondary | ICD-10-CM | POA: Diagnosis not present

## 2017-09-19 DIAGNOSIS — J449 Chronic obstructive pulmonary disease, unspecified: Secondary | ICD-10-CM | POA: Insufficient documentation

## 2017-09-19 DIAGNOSIS — J984 Other disorders of lung: Secondary | ICD-10-CM | POA: Diagnosis not present

## 2017-09-19 DIAGNOSIS — Z86711 Personal history of pulmonary embolism: Secondary | ICD-10-CM

## 2017-09-19 DIAGNOSIS — J439 Emphysema, unspecified: Secondary | ICD-10-CM | POA: Diagnosis not present

## 2017-09-19 LAB — POCT INR: INR: 1.6 — AB (ref 2.0–3.0)

## 2017-09-19 MED ORDER — TECHNETIUM TC 99M DIETHYLENETRIAME-PENTAACETIC ACID
30.0000 | Freq: Once | INTRAVENOUS | Status: AC | PRN
  Administered 2017-09-19: 26 via RESPIRATORY_TRACT

## 2017-09-19 MED ORDER — TECHNETIUM TO 99M ALBUMIN AGGREGATED
4.0000 | Freq: Once | INTRAVENOUS | Status: AC | PRN
  Administered 2017-09-19: 3.65 via INTRAVENOUS

## 2017-09-19 NOTE — Patient Instructions (Signed)
Take coumadin 6mg  tonight then increase dose to 6mg  daily except 5mg  on Mondays, Wednesdays and Fridays. Recheck 2 weeks in office

## 2017-09-20 ENCOUNTER — Ambulatory Visit (HOSPITAL_COMMUNITY): Payer: Medicare Other

## 2017-09-21 ENCOUNTER — Telehealth: Payer: Self-pay | Admitting: Pulmonary Disease

## 2017-09-21 NOTE — Telephone Encounter (Signed)
Spoke with the pt and notified of her CXR and pulmonary perf and vent scan  She verbalized understanding of this  She is asking about her lab results from 09/07/17  Please advise thanks

## 2017-09-21 NOTE — Telephone Encounter (Signed)
I tried calling her to ask her to clarify, she didn't answer

## 2017-09-21 NOTE — Telephone Encounter (Signed)
Spoke with the pt and notified of results  She does not agree that they are ok, she can see them in [place] She states "I will take this up with him" She has upcoming appt on 10/04/17  Will forward to BQ as FYI

## 2017-09-21 NOTE — Telephone Encounter (Signed)
Looked OK

## 2017-09-21 NOTE — Progress Notes (Signed)
Spoke with the pt and notified of results

## 2017-09-24 NOTE — Telephone Encounter (Signed)
LMTCB

## 2017-09-25 NOTE — Telephone Encounter (Addendum)
Spoke with pt, she thinks that her  INR at 1.6 and she feels this is too think. She assumed the INR was fine since that is what the nurse told her but she states that 1.6 is not good for her. She feels this number would have put her at risk for a blood clot and wondered why nobody called her. FYI BQ.  She stated she would be available today if BQ wanted to call her back.

## 2017-09-26 NOTE — Telephone Encounter (Signed)
Called patient unable to reach left message to give us a call back.

## 2017-09-26 NOTE — Telephone Encounter (Signed)
We were discussing her bleeding on the last visit. Please call and ask if this is still happening then we will go from there.

## 2017-09-27 NOTE — Telephone Encounter (Signed)
LMOM TCB x2

## 2017-09-28 NOTE — Telephone Encounter (Signed)
ATC patient-phone rang-appeared as though phone was answered but unable to hear anyone speaking. Triage will need to attempt to call back later today.

## 2017-09-28 NOTE — Telephone Encounter (Signed)
Spoke with patient-states she was called by Charm Barges here regarding her results-CXR and [facility] Pulmonary Perf and Vent. Pt was thinking that she was called about her INR results being normal. Pt's only concern was that cardiology and Coumadin clinic was not aware of INR results. Pt understands that we have requested she continue to see cardiology and coumadin clinic for all her PT/ INR results to ensure they follow up accordingly. Nothing more needed at this time.

## 2017-09-28 NOTE — Telephone Encounter (Signed)
Tried calling, her phone did was not working appropriately. She needs to contact her coumadin clinic about the INR result

## 2017-09-28 NOTE — Telephone Encounter (Signed)
Spoke with patient, reports her INR done 09/11 was 1.6. Her cardiologist wants it between 2-2.5. Patient wants to know why she did not receive a call from BQ. Patient was assured that if he thought something on her lab work needed to be addressed urgently he would have reached out or had his nurse call her. Patient would like a call from BQ.  BQ please advise. Patient will be available all day on her mobile number.

## 2017-10-04 ENCOUNTER — Other Ambulatory Visit (INDEPENDENT_AMBULATORY_CARE_PROVIDER_SITE_OTHER): Payer: Medicare Other

## 2017-10-04 ENCOUNTER — Ambulatory Visit (INDEPENDENT_AMBULATORY_CARE_PROVIDER_SITE_OTHER): Payer: Medicare Other | Admitting: Pulmonary Disease

## 2017-10-04 ENCOUNTER — Encounter: Payer: Self-pay | Admitting: Pulmonary Disease

## 2017-10-04 VITALS — BP 146/68 | HR 104 | Ht 64.0 in | Wt 160.6 lb

## 2017-10-04 DIAGNOSIS — I2510 Atherosclerotic heart disease of native coronary artery without angina pectoris: Secondary | ICD-10-CM

## 2017-10-04 DIAGNOSIS — F17200 Nicotine dependence, unspecified, uncomplicated: Secondary | ICD-10-CM | POA: Diagnosis not present

## 2017-10-04 DIAGNOSIS — Z86711 Personal history of pulmonary embolism: Secondary | ICD-10-CM

## 2017-10-04 DIAGNOSIS — J449 Chronic obstructive pulmonary disease, unspecified: Secondary | ICD-10-CM | POA: Diagnosis not present

## 2017-10-04 DIAGNOSIS — R042 Hemoptysis: Secondary | ICD-10-CM

## 2017-10-04 DIAGNOSIS — J9611 Chronic respiratory failure with hypoxia: Secondary | ICD-10-CM | POA: Diagnosis not present

## 2017-10-04 DIAGNOSIS — J439 Emphysema, unspecified: Secondary | ICD-10-CM

## 2017-10-04 DIAGNOSIS — Z9861 Coronary angioplasty status: Secondary | ICD-10-CM | POA: Diagnosis not present

## 2017-10-04 LAB — PROTIME-INR
INR: 2.4 ratio — AB (ref 0.8–1.0)
Prothrombin Time: 27.1 s — ABNORMAL HIGH (ref 9.6–13.1)

## 2017-10-04 NOTE — Progress Notes (Signed)
Synopsis: Former patient of Dr. Ashok [name] with severe COPD, pulmonary embolism, factor V Leidin and CAD.  She has been smoking since age 67, was still smoking 1/2-1 ppd in 08/2017.  She has had multiple blood clots in her lungs since the 1990's.  She has been on blood thinners (warfarin) since 2006.    Subjective:   PATIENT ID: [name] GENDER: female DOB: 04/29/1950, MRN: [id number]   HPI  Chief Complaint  Patient presents with  . Follow-up    feeling a little better - no more blood in phlegm - using 02 between 2-3 L   [name] says that she stopped coughing up blood.  She has not had any problems with hemoptysis or epistaxis since our last visit.  She says that every now and then she will have a slight amount of dried blood from her nose but this has not been much of a problem for her.  She says that the Coumadin clinic is adjusting her Coumadin dose right now.  She says that she tried taking humidification with her oxygen but it made her feel worse so she stopped.  She keeps taking Pulmicort and Brovana.  She continues to use and benefit from her oxygen regularly.  Past Medical History:  Diagnosis Date  . Acute MI ([place])    x4, code blue x3  . Arteriosclerotic cardiovascular disease (ASCVD) 2002   Inf STEMI-2002. 2003-cutting balloon + brachytherapy for restenosis; subsequent acute stent thrombosis 06/2010 requiring 2 separate interventions (Zeta stent, then repeat cath with thrombectomy). focal basal inf AK, nl EF; 03/2011: Patent stents, minor nonobst  residual dz, nl EF; neg stress nuclear in 2008 and stress echo in 2009  . Chronic anticoagulation    Warfarin plus ticagrelor  . Chronic respiratory failure ([place]) 08/27/2013   On 2L 02  . COPD (chronic obstructive pulmonary disease) ([place])    02 dependent  . COPD (chronic obstructive pulmonary disease) ([place])   . Factor 5 Leiden mutation, heterozygous ([place])   . Factor V Leiden, prothrombin gene mutation ([place]) 2006  .  Hyperlipidemia   . Hypothyroidism   . Noncompliance   . Pelvic fracture ([place]) 2009  . Pulmonary embolism ([place]) 2006   Associated with deep vein thrombosis-2006; + factor V Leiden  . Tobacco abuse    50 pack years     Review of Systems  Constitutional: Positive for malaise/fatigue and weight loss. Negative for diaphoresis.  HENT: Positive for nosebleeds. Negative for congestion and ear discharge.   Respiratory: Negative for cough, hemoptysis, sputum production, shortness of breath and wheezing.   Cardiovascular: Positive for leg swelling. Negative for chest pain and claudication.  Genitourinary: Negative for hematuria.      Objective:  Physical Exam   Vitals:   10/04/17 1142  BP: (!) 146/68  Pulse: (!) 104  SpO2: 98%  Weight: 160 lb 9.6 oz (72.8 kg)  Height: 5\' 4"  (1.626 m)   2L [place]  Gen: chronically ill appearing HENT: OP clear, TM's clear, neck supple PULM: Some wheezing left greater than right B, normal percussion CV: RRR, no mgr, trace edema GI: BS+, soft, nontender Derm: no cyanosis or rash Psyche: normal mood and affect   CBC    Component Value Date/Time   WBC 6.6 08/15/2017 1409   RBC 4.19 08/15/2017 1409   HGB 11.8 (L) 08/15/2017 1409   HCT 36.0 08/15/2017 1409   PLT 263.0 08/15/2017 1409   MCV 86.0 08/15/2017 1409   MCH 28.3 03/03/2017 0459  MCHC 32.6 08/15/2017 1409   RDW 17.0 (H) 08/15/2017 1409   LYMPHSABS 1.4 08/15/2017 1409   MONOABS 0.9 08/15/2017 1409   EOSABS 0.1 08/15/2017 1409   BASOSABS 0.1 08/15/2017 1409     PFT 03/14/16: FVC 1.20 L (35%) FEV1 0.51 L (19%) FEV1/FVC 0.42 FEF 25-75 0.23 L (10%) positive bronchodilator response 11/26/14: FVC 0.91 L (26%) FEV1 0.45 L (17%) FEV1/FVC 0.49 FEF 25-75 0.18 L (7%) positive bronchodilator response                                                                   DLCO uncorrected 22% 07/05/11: FVC 1.38 L (42%) FEV1 0.68 L (28%) FEV1/FVC 0.49 FEF 25-75 0.20 L (7%) negative bronchodilator  response TLC 4.37 L (83%) RV 142% ERV 61% DLCO uncorrected 39% 05/31/04: FVC 2.14 L (62%) FEV1 1.23 L (47%) FEV1/FVC 0.57 FEF 25-75 0.56 L (19%) negative bronchodilator response TLC 4.78 L (89%) RV 135% ERV 12% DLCO UNcorrected 65%  6MWT 11/26/14:  Walked 48 meters / Baseline Sat 90% on RA / Nadir Sat 88% on RA (heart rate was irregular & pt ambulated for <59min due to dyspnea)  IMAGING August 2019 VQ scan showed no evidence of a pulmonary embolism, chest x-ray showed severe emphysema both images independently reviewed by me February 2019 CT angiogram chest images independently reviewed showing moderate to severe emphysematous changes throughout both lungs, no pulmonary embolism. CTA CHEST 08/25/13 (per radiologist): No pulmonary embolus. Emphysematous & bronchitic changes. Chronic atelectasis versus scarring left lower lobe. Patchy segmental infiltrate in the posterior right lower lobe. No pleural effusion. Small pericardial effusion.  CARDIAC February 2019 echocardiogram grade 2 diastolic dysfunction with normal LVEF, no comment on pulmonary artery pressures. TTE (04/29/13): Mild LVH. EF 50-55 %. grade 1 diastolic dysfunction. Akinesis of basal-mid inferior myocardium. LA & RA normal in size. RV normal in size and function. No aortic stenosis or regurgitation. Trivial mitral regurgitation. Trivial tricuspid regurgitation. IVC normal in size. Small pericardial effusion.    February 2019 hospitalization reviewed where she had a COPD exacerbation due to influenza A.     Assessment & Plan:   History of pulmonary embolus (PE) - Plan: INR/PT  Hemoptysis - Plan: CT Chest Wo Contrast  Emphysematous bleb (HCC)  COPD, very severe (HCC)  Chronic respiratory failure with hypoxia (HCC)  Tobacco use disorder  Discussion: [name] is no longer coughing up blood which is a good thing, however I still remain concerned about the episode.  I think it was likely due to her epistaxis and taking multiple  blood thinners.  That being said, she is at increased risk for lung malignancy.  She did not have the CT scan I requested after the last visit so we will need to reorder that.  She is quite focused on her INR results, I will repeat the PT/INR today.  Plan:  Hemoptysis (coughing up blood): I'm glad this is better You need to have a CT scan of your chest to make sure there are no other causes of this We will call you with those results Let me know if this is happening again, if so I'll need to perform a bronchoscopy  Severe COPD: Continue Pulmicort Continue Brovana Continue albuterol as needed Stay active Practice good hand  hygiene I am glad you have already had a flu shot  Epistaxis: Resolved: Monitor  Tobacco abuse: Quit smoking  Chronic respiratory failure with hypoxemia: Keep using oxygen as you are doing  History of recurrent pulmonary embolism, recent low PT/INR: Repeat PT/INR today, keep follow-up with the Coumadin clinic  Follow-up with me in 4 to 6 months or sooner if needed  > 50% of this 25 minute visit spent face to face   Current Outpatient Medications:  .  albuterol (PROVENTIL) (2.5 MG/3ML) 0.083% nebulizer solution, USE 1 VIAL BY NEBULIZER EVERY 4 HOURS AS NEEDED FOR WHEEZING. DX: J44.9, Disp: 375 vial, Rfl: 0 .  arformoterol (BROVANA) 15 MCG/2ML NEBU, Take 2 mLs (15 mcg total) by nebulization 2 (two) times daily., Disp: 120 mL, Rfl: 6 .  benzonatate (TESSALON) 100 MG capsule, Take 1 capsule (100 mg total) by mouth 2 (two) times daily as needed for cough., Disp: 20 capsule, Rfl: 1 .  BRILINTA 60 MG TABS tablet, TAKE 1 TABLET(60 MG) BY MOUTH TWICE DAILY, Disp: 60 tablet, Rfl: 6 .  budesonide (PULMICORT) 0.5 MG/2ML nebulizer solution, Take 2 mLs (0.5 mg total) by nebulization 2 (two) times daily. Dx: J43.9, Disp: 120 mL, Rfl: 3 .  fentaNYL (DURAGESIC - DOSED MCG/HR) 50 MCG/HR, Place 50 mcg onto the skin every 3 (three) days., Disp: , Rfl:  .  ferrous sulfate  325 (65 FE) MG tablet, Take 325 mg by mouth daily with breakfast., Disp: , Rfl:  .  ipratropium-albuterol (DUONEB) 0.5-2.5 (3) MG/3ML SOLN, Take 3 mLs by nebulization 4 (four) times daily., Disp: 360 mL, Rfl: 3 .  levothyroxine (SYNTHROID, LEVOTHROID) 125 MCG tablet, Take 1 tablet (125 mcg total) by mouth daily., Disp: 90 tablet, Rfl: 3 .  nitroGLYCERIN (NITROSTAT) 0.4 MG SL tablet, Place 1 tablet (0.4 mg total) under the tongue every 5 (five) minutes as needed for chest pain., Disp: 25 tablet, Rfl: 3 .  Omega-3 Fatty Acids (FISH OIL) 600 MG CAPS, Take 1 capsule by mouth 3 (three) times daily., Disp: , Rfl:  .  OXYGEN, Inhale 3 L into the lungs continuous. , Disp: , Rfl:  .  Respiratory Therapy Supplies (FLUTTER) DEVI, Use as directed, Disp: 1 each, Rfl: 0 .  traZODone (DESYREL) 50 MG tablet, Take 50 mg by mouth at bedtime as needed for sleep., Disp: , Rfl:  .  warfarin (COUMADIN) 1 MG tablet, Take 1 tablet (1mg ) on Tuesdays and Saturdays along with a 5mg  tablet (Patient taking differently: Take 1 mg by mouth 2 (two) times a week. Take 1 tablet (1mg ) on Tuesdays and Fridays along with a 5mg  tablet), Disp: 30 tablet, Rfl: 4 .  warfarin (COUMADIN) 5 MG tablet, Take 1 tablet (5 mg total) by mouth daily. (Patient taking differently: Take 5-6 mg by mouth daily. 6mg  on Saturdays, 5mg  on Sunday, Monday, 6mg  on Tuesdays, 5mg  on Wednesdays, 6mg  on Thursdays, and 5mg  on Fridays.), Disp: 90 tablet, Rfl: 3

## 2017-10-04 NOTE — Patient Instructions (Signed)
Need to do a bronchoscopy  COPD Continue using Brovana and Pulmicort   Chronic Respiratory Failure with Hypoxemia Continue oxygen as you are doing  Factor 5/ history of pulmonary embolism  [place] PT/INR

## 2017-10-05 ENCOUNTER — Ambulatory Visit (INDEPENDENT_AMBULATORY_CARE_PROVIDER_SITE_OTHER): Payer: Medicare Other | Admitting: *Deleted

## 2017-10-05 DIAGNOSIS — I4891 Unspecified atrial fibrillation: Secondary | ICD-10-CM | POA: Diagnosis not present

## 2017-10-05 DIAGNOSIS — Z86711 Personal history of pulmonary embolism: Secondary | ICD-10-CM

## 2017-10-05 DIAGNOSIS — Z5181 Encounter for therapeutic drug level monitoring: Secondary | ICD-10-CM | POA: Diagnosis not present

## 2017-10-05 DIAGNOSIS — D6851 Activated protein C resistance: Secondary | ICD-10-CM | POA: Diagnosis not present

## 2017-10-05 LAB — POCT INR: INR: 2.3 (ref 2.0–3.0)

## 2017-10-05 MED ORDER — TICAGRELOR 60 MG PO TABS: ORAL_TABLET | ORAL | 3 refills | Status: DC

## 2017-10-05 NOTE — Patient Instructions (Signed)
Continue coumadin 6mg  daily except 5mg  on Mondays, Wednesdays and Fridays. Recheck 4 weeks in office

## 2017-10-09 ENCOUNTER — Telehealth: Payer: Self-pay | Admitting: Pulmonary Disease

## 2017-10-09 NOTE — Telephone Encounter (Signed)
Notes recorded by Juanito Doom, MD on 10/04/2017 at 5:15 PM EDT BJ, Please let the patient know her INR was 2.4 Thanks, B ---------------------------------- Attempted to contact pt. I did not receive an answer. There was no option for me to leave a message. Will try back.

## 2017-10-10 NOTE — Telephone Encounter (Signed)
Called and spoke with patient, made aware of results per BQ. Voiced understanding. Nothing further needed at this time.

## 2017-10-24 ENCOUNTER — Ambulatory Visit (HOSPITAL_COMMUNITY): Payer: Medicare Other

## 2017-10-30 ENCOUNTER — Encounter (HOSPITAL_COMMUNITY): Payer: Self-pay | Admitting: Anesthesiology

## 2017-10-30 ENCOUNTER — Encounter (HOSPITAL_COMMUNITY): Payer: Self-pay | Admitting: Certified Registered"

## 2017-10-30 ENCOUNTER — Encounter (HOSPITAL_COMMUNITY): Admission: RE | Payer: Self-pay | Source: Ambulatory Visit

## 2017-10-30 ENCOUNTER — Ambulatory Visit (HOSPITAL_COMMUNITY): Admission: RE | Admit: 2017-10-30 | Payer: Medicare Other | Source: Ambulatory Visit | Admitting: Internal Medicine

## 2017-10-30 SURGERY — COLONOSCOPY WITH PROPOFOL
Anesthesia: Monitor Anesthesia Care

## 2017-10-30 MED ORDER — PROPOFOL 10 MG/ML IV BOLUS
INTRAVENOUS | Status: AC
  Filled 2017-10-30: qty 60

## 2017-10-30 NOTE — Progress Notes (Signed)
Patient no show day of procedure for colonoscopy.  Attempted to call, no answer.  Vista Lawman, RN

## 2017-10-30 NOTE — Anesthesia Preprocedure Evaluation (Deleted)
Anesthesia Evaluation    Airway        Dental   Pulmonary COPD, Current Smoker,           Cardiovascular + CAD, + Past MI and + Cardiac Stents       Neuro/Psych    GI/Hepatic   Endo/Other    Renal/GU      Musculoskeletal   Abdominal   Peds  Hematology   Anesthesia Other Findings Rectal bleeding, Factor V leiden on brilinta  Reproductive/Obstetrics                             Anesthesia Physical Anesthesia Plan Anesthesia Quick Evaluation

## 2017-10-31 ENCOUNTER — Ambulatory Visit (INDEPENDENT_AMBULATORY_CARE_PROVIDER_SITE_OTHER): Payer: Medicare Other | Admitting: *Deleted

## 2017-10-31 DIAGNOSIS — Z86711 Personal history of pulmonary embolism: Secondary | ICD-10-CM | POA: Diagnosis not present

## 2017-10-31 DIAGNOSIS — Z5181 Encounter for therapeutic drug level monitoring: Secondary | ICD-10-CM

## 2017-10-31 DIAGNOSIS — D6851 Activated protein C resistance: Secondary | ICD-10-CM

## 2017-10-31 DIAGNOSIS — I4891 Unspecified atrial fibrillation: Secondary | ICD-10-CM | POA: Diagnosis not present

## 2017-10-31 LAB — POCT INR: INR: 2.3 (ref 2.0–3.0)

## 2017-10-31 NOTE — Patient Instructions (Signed)
Continue coumadin 6mg  daily except 5mg  on Mondays, Wednesdays and Fridays. Recheck 4 weeks in office

## 2017-11-07 ENCOUNTER — Telehealth: Payer: Self-pay | Admitting: *Deleted

## 2017-11-07 MED ORDER — WARFARIN SODIUM 1 MG PO TABS: ORAL_TABLET | ORAL | 3 refills | Status: DC

## 2017-11-07 NOTE — Telephone Encounter (Signed)
Warfarin Rx sent to [facility] on [address] per pt request

## 2017-11-07 NOTE — Telephone Encounter (Signed)
Please give pt a call concerning her Rx [phone]

## 2017-11-14 ENCOUNTER — Ambulatory Visit (HOSPITAL_COMMUNITY)
Admission: RE | Admit: 2017-11-14 | Discharge: 2017-11-14 | Disposition: A | Discharge status: Home / Self Care | Payer: Medicare Other | Source: Ambulatory Visit | Attending: Pulmonary Disease | Admitting: Pulmonary Disease

## 2017-11-14 DIAGNOSIS — I2510 Atherosclerotic heart disease of native coronary artery without angina pectoris: Secondary | ICD-10-CM | POA: Insufficient documentation

## 2017-11-14 DIAGNOSIS — J439 Emphysema, unspecified: Secondary | ICD-10-CM | POA: Diagnosis not present

## 2017-11-14 DIAGNOSIS — R042 Hemoptysis: Secondary | ICD-10-CM | POA: Diagnosis present

## 2017-11-14 DIAGNOSIS — F17200 Nicotine dependence, unspecified, uncomplicated: Secondary | ICD-10-CM | POA: Insufficient documentation

## 2017-11-14 DIAGNOSIS — I700 Atherosclerosis of aorta: Secondary | ICD-10-CM | POA: Insufficient documentation

## 2017-11-14 DIAGNOSIS — R0602 Shortness of breath: Secondary | ICD-10-CM | POA: Diagnosis not present

## 2017-11-21 NOTE — Progress Notes (Signed)
LMTCB

## 2017-12-05 ENCOUNTER — Ambulatory Visit (INDEPENDENT_AMBULATORY_CARE_PROVIDER_SITE_OTHER): Payer: Medicare Other | Admitting: *Deleted

## 2017-12-05 DIAGNOSIS — I4891 Unspecified atrial fibrillation: Secondary | ICD-10-CM | POA: Diagnosis not present

## 2017-12-05 DIAGNOSIS — Z5181 Encounter for therapeutic drug level monitoring: Secondary | ICD-10-CM

## 2017-12-05 DIAGNOSIS — Z86711 Personal history of pulmonary embolism: Secondary | ICD-10-CM

## 2017-12-05 DIAGNOSIS — D6851 Activated protein C resistance: Secondary | ICD-10-CM

## 2017-12-05 LAB — POCT INR: INR: 2.4 (ref 2.0–3.0)

## 2017-12-05 NOTE — Patient Instructions (Signed)
Continue coumadin 6mg  daily except 5mg  on Mondays, Wednesdays and Fridays. Recheck 6 weeks in office

## 2018-01-16 ENCOUNTER — Ambulatory Visit: Payer: Medicare Other | Admitting: Student

## 2018-01-16 NOTE — Progress Notes (Deleted)
Cardiology Office Note    Date:  01/16/2018   ID:  [name], DOB 08-Jan-1951, MRN [id number]  PCP:  [name], NP  Cardiologist: Kate Sable, MD    No chief complaint on file.   History of Present Illness:    [name] is a 68 y.o. female with past medical history of CAD (s/p PCI to RCA with ISR and subsequent intervention in 2003 and 2012, patent stents by cath in 2013), HTN, HLD, COPD, history of PE, and Factor V Leiden deficiency (on Coumadin) who presents to the office today for 58-month follow-up.  She was last examined by Kerin Ransom, PA-C in 05/2017 for preoperative cardiac clearance in regards to an upcoming spinal injection for which she would require Lovenox bridging. She denied any chest discomfort at that time but reported having baseline dyspnea on exertion in the setting of COPD which had been unchanged. She had been a nonresponder to Plavix and remained on Brilinta 60 mg twice daily along with continuing on Coumadin for anticoagulation. Was not interested in statin therapy.   Past Medical History:  Diagnosis Date  . Acute MI ([place])    x4, code blue x3  . Arteriosclerotic cardiovascular disease (ASCVD) 2002   Inf STEMI-2002. 2003-cutting balloon + brachytherapy for restenosis; subsequent acute stent thrombosis 06/2010 requiring 2 separate interventions (Zeta stent, then repeat cath with thrombectomy). focal basal inf AK, nl EF; 03/2011: Patent stents, minor nonobst  residual dz, nl EF; neg stress nuclear in 2008 and stress echo in 2009  . Chronic anticoagulation    Warfarin plus ticagrelor  . Chronic respiratory failure ([place]) 08/27/2013   On 2L 02  . COPD (chronic obstructive pulmonary disease) ([place])    02 dependent  . COPD (chronic obstructive pulmonary disease) ([place])   . Factor 5 Leiden mutation, heterozygous ([place])   . Factor V Leiden, prothrombin gene mutation ([place]) 2006  . Hyperlipidemia   . Hypothyroidism   . Noncompliance   . Pelvic fracture  ([place]) 2009  . Pulmonary embolism ([place]) 2006   Associated with deep vein thrombosis-2006; + factor V Leiden  . Tobacco abuse    50 pack years    Past Surgical History:  Procedure Laterality Date  . COLONOSCOPY  Approximately 2000   Negative screening study  . CORONARY ANGIOPLASTY  2002, 2003, 2012  . LEFT AND RIGHT HEART CATHETERIZATION WITH CORONARY ANGIOGRAM N/A 04/03/2011   Procedure: LEFT AND RIGHT HEART CATHETERIZATION WITH CORONARY ANGIOGRAM;  Surgeon: Sherren Mocha, MD;  Location: [facility] CATH LAB;  Service: Cardiovascular;  Laterality: N/A;    Current Medications: Outpatient Medications Prior to Visit  Medication Sig Dispense Refill  . albuterol (PROVENTIL) (2.5 MG/3ML) 0.083% nebulizer solution USE 1 VIAL BY NEBULIZER EVERY 4 HOURS AS NEEDED FOR WHEEZING. DX: J44.9 375 vial 0  . arformoterol (BROVANA) 15 MCG/2ML NEBU Take 2 mLs (15 mcg total) by nebulization 2 (two) times daily. 120 mL 6  . benzonatate (TESSALON) 100 MG capsule Take 1 capsule (100 mg total) by mouth 2 (two) times daily as needed for cough. 20 capsule 1  . budesonide (PULMICORT) 0.5 MG/2ML nebulizer solution Take 2 mLs (0.5 mg total) by nebulization 2 (two) times daily. Dx: J43.9 120 mL 3  . fentaNYL (DURAGESIC - DOSED MCG/HR) 50 MCG/HR Place 50 mcg onto the skin every 3 (three) days.    . ferrous sulfate 325 (65 FE) MG tablet Take 325 mg by mouth daily with breakfast.    . ipratropium-albuterol (DUONEB) 0.5-2.5 (3) MG/3ML  SOLN Take 3 mLs by nebulization 4 (four) times daily. 360 mL 3  . levothyroxine (SYNTHROID, LEVOTHROID) 125 MCG tablet Take 1 tablet (125 mcg total) by mouth daily. 90 tablet 3  . nitroGLYCERIN (NITROSTAT) 0.4 MG SL tablet Place 1 tablet (0.4 mg total) under the tongue every 5 (five) minutes as needed for chest pain. 25 tablet 3  . Omega-3 Fatty Acids (FISH OIL) 600 MG CAPS Take 1 capsule by mouth 3 (three) times daily.    . OXYGEN Inhale 3 L into the lungs continuous.     [name] Respiratory Therapy  Supplies (FLUTTER) DEVI Use as directed 1 each 0  . ticagrelor (BRILINTA) 60 MG TABS tablet TAKE 1 TABLET(60 MG) BY MOUTH TWICE DAILY 180 tablet 3  . traZODone (DESYREL) 50 MG tablet Take 50 mg by mouth at bedtime as needed for sleep.    [name] warfarin (COUMADIN) 1 MG tablet Take 1 tablet (1mg ) on Sunday, Tuesday, Thursday and Saturday along with a 5mg  tablet or as directed 60 tablet 3   No facility-administered medications prior to visit.      Allergies:   Dilaudid [hydromorphone hcl]; Minocycline hcl; Prednisone; Varenicline tartrate; and Zocor [simvastatin - high dose]   Social History   Socioeconomic History  . Marital status: Single    Spouse name: Not on file  . Number of children: Not on file  . Years of education: Not on file  . Highest education level: Not on file  Occupational History  . Occupation: Disabled  . 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 Financial resource strain: Not on file  . Food insecurity:    Worry: Not on file    Inability: Not on file  . Transportation needs:    Medical: Not on file    Non-medical: Not on file  Tobacco Use  . Smoking status: Current Some Day Smoker    Packs/day: 0.50    Years: 50.00    Pack years: 25.00    Types: Cigarettes    Start date: 05/10/1968  . Smokeless tobacco: Never Used  Substance and Sexual Activity  . Alcohol use: No    Alcohol/week: 0.0 standard drinks  . Drug use: No  . Sexual activity: Not on file  Lifestyle  . Physical activity:    Days per week: Not on file    Minutes per session: Not on file  . Stress: Not on file  Relationships  . Social connections:    Talks on phone: Not on file    Gets together: Not on file    Attends religious service: Not on file    Active member of club or organization: Not on file    Attends meetings of clubs or organizations: Not on file    Relationship status: Not on file  Other Topics Concern  . Not on file  Social History Narrative   Originally from [place]. Previously has  lived in [place], [place], [place] & moved back to [place] in 1994. 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 Ronie Spies was in her current home. She also had an owl living in her house as a rehab pet. She also had chickens until Fall 2015. No mold exposure.      Family History:  The patient's ***family history includes Alzheimer's disease in her mother; Emphysema in her mother and sister; Factor V Leiden deficiency in her daughter, father, and sister; Heart disease in  her father; Kidney cancer in her paternal grandmother.   Review of Systems:   Please see the history of present illness.     General:  No chills, fever, night sweats or weight changes.  Cardiovascular:  No chest pain, dyspnea on exertion, edema, orthopnea, palpitations, paroxysmal nocturnal dyspnea. Dermatological: No rash, lesions/masses Respiratory: No cough, dyspnea Urologic: No hematuria, dysuria Abdominal:   No nausea, vomiting, diarrhea, bright red blood per rectum, melena, or hematemesis Neurologic:  No visual changes, wkns, changes in mental status. All other systems reviewed and are otherwise negative except as noted above.   Physical Exam:    VS:  There were no vitals taken for this visit.   General: Well developed, well nourished,female appearing in no acute distress. Head: Normocephalic, atraumatic, sclera non-icteric, no xanthomas, nares are without discharge.  Neck: No carotid bruits. JVD not elevated.  Lungs: Respirations regular and unlabored, without wheezes or rales.  Heart: ***Regular rate and rhythm. No S3 or S4.  No murmur, no rubs, or gallops appreciated. Abdomen: Soft, non-tender, non-distended with normoactive bowel sounds. No hepatomegaly. No rebound/guarding. No obvious abdominal masses. Msk:  Strength and tone appear normal for age. No joint deformities or effusions. Extremities: No clubbing or cyanosis. No edema.  Distal pedal pulses are 2+  bilaterally. Neuro: Alert and oriented X 3. Moves all extremities spontaneously. No focal deficits noted. Psych:  Responds to questions appropriately with a normal affect. Skin: No rashes or lesions noted  Wt Readings from Last 3 Encounters:  10/04/17 160 lb 9.6 oz (72.8 kg)  09/07/17 168 lb 3.2 oz (76.3 kg)  09/05/17 166 lb 4 oz (75.4 kg)        Studies/Labs Reviewed:   EKG:  EKG is*** ordered today.  The ekg ordered today demonstrates ***  Recent Labs: 02/19/2017: B Natriuretic Peptide 121.0 02/23/2017: Magnesium 2.0 03/03/2017: ALT 105 08/15/2017: Hemoglobin 11.8; Platelets 263.0; TSH 3.48 08/30/2017: BUN 20; Creatinine, Ser 0.85; Potassium 4.0; Sodium 141   Lipid Panel    Component Value Date/Time   CHOL 194 09/21/2016 1031   TRIG 90.0 09/21/2016 1031   HDL 52.20 09/21/2016 1031   CHOLHDL 4 09/21/2016 1031   VLDL 18.0 09/21/2016 1031   LDLCALC 124 (H) 09/21/2016 1031   LDLDIRECT 172.6 04/01/2008 1205    Additional studies/ records that were reviewed today include:   Echocardiogram: 02/2017 Study Conclusions  - Left ventricle: The cavity size was normal. Wall thickness was   increased in a pattern of mild LVH. Systolic function was normal.   The estimated ejection fraction was in the range of 55% to 60%.   Wall motion was normal; there were no regional wall motion   abnormalities. Features are consistent with a pseudonormal left   ventricular filling pattern, with concomitant abnormal relaxation   and increased filling pressure (grade 2 diastolic dysfunction).   Doppler parameters are consistent with high ventricular filling   pressure. - Mitral valve: There was mild regurgitation. - Pericardium, extracardiac: A trivial pericardial effusion was   identified.  Assessment:    No diagnosis found.   Plan:   In order of problems listed above:  1. ***    Medication Adjustments/Labs and Tests Ordered: Current medicines are reviewed at length with the  patient today.  Concerns regarding medicines are outlined above.  Medication changes, Labs and Tests ordered today are listed in the Patient Instructions below. There are no Patient Instructions on file for this visit.   Signed, Erma Heritage, PA-C  01/16/2018  [facility] [address] [place], [place] [address] Phone: [phone]

## 2018-01-19 ENCOUNTER — Encounter (HOSPITAL_COMMUNITY): Payer: Self-pay | Admitting: Emergency Medicine

## 2018-01-19 ENCOUNTER — Inpatient Hospital Stay (HOSPITAL_COMMUNITY)
Admission: EM | Admit: 2018-01-19 | Discharge: 2018-01-24 | DRG: 191 | Disposition: A | Discharge status: Skilled Nursing Facility | Payer: Medicare Other | Attending: Internal Medicine | Admitting: Internal Medicine

## 2018-01-19 ENCOUNTER — Other Ambulatory Visit: Payer: Self-pay

## 2018-01-19 ENCOUNTER — Emergency Department (HOSPITAL_COMMUNITY): Payer: Medicare Other

## 2018-01-19 DIAGNOSIS — Z79891 Long term (current) use of opiate analgesic: Secondary | ICD-10-CM

## 2018-01-19 DIAGNOSIS — D6852 Prothrombin gene mutation: Secondary | ICD-10-CM | POA: Diagnosis present

## 2018-01-19 DIAGNOSIS — T380X5A Adverse effect of glucocorticoids and synthetic analogues, initial encounter: Secondary | ICD-10-CM | POA: Diagnosis present

## 2018-01-19 DIAGNOSIS — I2510 Atherosclerotic heart disease of native coronary artery without angina pectoris: Secondary | ICD-10-CM | POA: Diagnosis present

## 2018-01-19 DIAGNOSIS — Y92019 Unspecified place in single-family (private) house as the place of occurrence of the external cause: Secondary | ICD-10-CM

## 2018-01-19 DIAGNOSIS — Z79890 Hormone replacement therapy (postmenopausal): Secondary | ICD-10-CM

## 2018-01-19 DIAGNOSIS — W1830XA Fall on same level, unspecified, initial encounter: Secondary | ICD-10-CM | POA: Diagnosis not present

## 2018-01-19 DIAGNOSIS — D6851 Activated protein C resistance: Secondary | ICD-10-CM | POA: Diagnosis not present

## 2018-01-19 DIAGNOSIS — J9611 Chronic respiratory failure with hypoxia: Secondary | ICD-10-CM | POA: Diagnosis not present

## 2018-01-19 DIAGNOSIS — S3992XA Unspecified injury of lower back, initial encounter: Secondary | ICD-10-CM | POA: Diagnosis not present

## 2018-01-19 DIAGNOSIS — S32039A Unspecified fracture of third lumbar vertebra, initial encounter for closed fracture: Secondary | ICD-10-CM | POA: Diagnosis not present

## 2018-01-19 DIAGNOSIS — G8929 Other chronic pain: Secondary | ICD-10-CM | POA: Diagnosis not present

## 2018-01-19 DIAGNOSIS — E039 Hypothyroidism, unspecified: Secondary | ICD-10-CM | POA: Diagnosis present

## 2018-01-19 DIAGNOSIS — S3993XA Unspecified injury of pelvis, initial encounter: Secondary | ICD-10-CM | POA: Diagnosis not present

## 2018-01-19 DIAGNOSIS — Z9181 History of falling: Secondary | ICD-10-CM | POA: Diagnosis not present

## 2018-01-19 DIAGNOSIS — F17210 Nicotine dependence, cigarettes, uncomplicated: Secondary | ICD-10-CM | POA: Diagnosis present

## 2018-01-19 DIAGNOSIS — J439 Emphysema, unspecified: Secondary | ICD-10-CM | POA: Diagnosis present

## 2018-01-19 DIAGNOSIS — W19XXXA Unspecified fall, initial encounter: Secondary | ICD-10-CM

## 2018-01-19 DIAGNOSIS — M8008XA Age-related osteoporosis with current pathological fracture, vertebra(e), initial encounter for fracture: Secondary | ICD-10-CM | POA: Diagnosis not present

## 2018-01-19 DIAGNOSIS — K5900 Constipation, unspecified: Secondary | ICD-10-CM | POA: Diagnosis present

## 2018-01-19 DIAGNOSIS — Z86718 Personal history of other venous thrombosis and embolism: Secondary | ICD-10-CM

## 2018-01-19 DIAGNOSIS — S32000A Wedge compression fracture of unspecified lumbar vertebra, initial encounter for closed fracture: Secondary | ICD-10-CM

## 2018-01-19 DIAGNOSIS — R739 Hyperglycemia, unspecified: Secondary | ICD-10-CM | POA: Diagnosis not present

## 2018-01-19 DIAGNOSIS — Z9981 Dependence on supplemental oxygen: Secondary | ICD-10-CM | POA: Diagnosis not present

## 2018-01-19 DIAGNOSIS — R5381 Other malaise: Secondary | ICD-10-CM | POA: Diagnosis present

## 2018-01-19 DIAGNOSIS — Z79899 Other long term (current) drug therapy: Secondary | ICD-10-CM

## 2018-01-19 DIAGNOSIS — Z955 Presence of coronary angioplasty implant and graft: Secondary | ICD-10-CM

## 2018-01-19 DIAGNOSIS — Z7901 Long term (current) use of anticoagulants: Secondary | ICD-10-CM | POA: Diagnosis not present

## 2018-01-19 DIAGNOSIS — M545 Low back pain, unspecified: Secondary | ICD-10-CM

## 2018-01-19 DIAGNOSIS — E785 Hyperlipidemia, unspecified: Secondary | ICD-10-CM | POA: Diagnosis not present

## 2018-01-19 DIAGNOSIS — J9610 Chronic respiratory failure, unspecified whether with hypoxia or hypercapnia: Secondary | ICD-10-CM

## 2018-01-19 DIAGNOSIS — Z7951 Long term (current) use of inhaled steroids: Secondary | ICD-10-CM

## 2018-01-19 DIAGNOSIS — Z86711 Personal history of pulmonary embolism: Secondary | ICD-10-CM | POA: Diagnosis not present

## 2018-01-19 DIAGNOSIS — R52 Pain, unspecified: Secondary | ICD-10-CM | POA: Diagnosis not present

## 2018-01-19 DIAGNOSIS — M5489 Other dorsalgia: Secondary | ICD-10-CM | POA: Diagnosis not present

## 2018-01-19 DIAGNOSIS — I252 Old myocardial infarction: Secondary | ICD-10-CM

## 2018-01-19 DIAGNOSIS — S32010A Wedge compression fracture of first lumbar vertebra, initial encounter for closed fracture: Secondary | ICD-10-CM | POA: Diagnosis not present

## 2018-01-19 DIAGNOSIS — J441 Chronic obstructive pulmonary disease with (acute) exacerbation: Principal | ICD-10-CM | POA: Diagnosis present

## 2018-01-19 DIAGNOSIS — Y92009 Unspecified place in unspecified non-institutional (private) residence as the place of occurrence of the external cause: Secondary | ICD-10-CM

## 2018-01-19 DIAGNOSIS — R0602 Shortness of breath: Secondary | ICD-10-CM | POA: Diagnosis not present

## 2018-01-19 DIAGNOSIS — S32050A Wedge compression fracture of fifth lumbar vertebra, initial encounter for closed fracture: Secondary | ICD-10-CM | POA: Diagnosis not present

## 2018-01-19 DIAGNOSIS — F17200 Nicotine dependence, unspecified, uncomplicated: Secondary | ICD-10-CM | POA: Diagnosis present

## 2018-01-19 DIAGNOSIS — S32030A Wedge compression fracture of third lumbar vertebra, initial encounter for closed fracture: Secondary | ICD-10-CM | POA: Diagnosis not present

## 2018-01-19 DIAGNOSIS — J9690 Respiratory failure, unspecified, unspecified whether with hypoxia or hypercapnia: Secondary | ICD-10-CM | POA: Diagnosis present

## 2018-01-19 DIAGNOSIS — R9431 Abnormal electrocardiogram [ECG] [EKG]: Secondary | ICD-10-CM | POA: Diagnosis not present

## 2018-01-19 LAB — URINALYSIS, ROUTINE W REFLEX MICROSCOPIC
BACTERIA UA: NONE SEEN
BILIRUBIN URINE: NEGATIVE
Glucose, UA: NEGATIVE mg/dL
KETONES UR: NEGATIVE mg/dL
Leukocytes, UA: NEGATIVE
NITRITE: NEGATIVE
PROTEIN: 100 mg/dL — AB
SPECIFIC GRAVITY, URINE: 1.019 (ref 1.005–1.030)
pH: 6 (ref 5.0–8.0)

## 2018-01-19 LAB — PROTIME-INR
INR: 2.67
Prothrombin Time: 28 seconds — ABNORMAL HIGH (ref 11.4–15.2)

## 2018-01-19 LAB — BASIC METABOLIC PANEL
ANION GAP: 7 (ref 5–15)
BUN: 21 mg/dL (ref 8–23)
CHLORIDE: 96 mmol/L — AB (ref 98–111)
CO2: 37 mmol/L — ABNORMAL HIGH (ref 22–32)
Calcium: 8.8 mg/dL — ABNORMAL LOW (ref 8.9–10.3)
Creatinine, Ser: 0.89 mg/dL (ref 0.44–1.00)
GFR calc non Af Amer: >60 mL/min (ref >60)
Glucose, Bld: 160 mg/dL — ABNORMAL HIGH (ref 70–99)
POTASSIUM: 3.8 mmol/L (ref 3.5–5.1)
Sodium: 140 mmol/L (ref 135–145)

## 2018-01-19 LAB — CBC
HCT: 38.6 % (ref 36.0–46.0)
HEMOGLOBIN: 11.1 g/dL — AB (ref 12.0–15.0)
MCH: 28.8 pg (ref 26.0–34.0)
MCHC: 28.8 g/dL — ABNORMAL LOW (ref 30.0–36.0)
MCV: 100.3 fL — ABNORMAL HIGH (ref 80.0–100.0)
NRBC: 0 % (ref 0.0–0.2)
Platelets: 211 10*3/uL (ref 150–400)
RBC: 3.85 MIL/uL — AB (ref 3.87–5.11)
RDW: 15.9 % — ABNORMAL HIGH (ref 11.5–15.5)
WBC: 9.3 10*3/uL (ref 4.0–10.5)

## 2018-01-19 LAB — TROPONIN I: Troponin I: <0.03 ng/mL (ref <0.03)

## 2018-01-19 LAB — I-STAT TROPONIN, ED: TROPONIN I, POC: 0.01 ng/mL (ref 0.00–0.08)

## 2018-01-19 MED ORDER — SODIUM CHLORIDE 0.9 % IV SOLN
INTRAVENOUS | Status: DC
  Administered 2018-01-19 – 2018-01-20 (×2): via INTRAVENOUS

## 2018-01-19 MED ORDER — FENTANYL CITRATE (PF) 100 MCG/2ML IJ SOLN
50.0000 ug | INTRAMUSCULAR | Status: AC | PRN
  Administered 2018-01-20 (×2): 50 ug via INTRAVENOUS
  Filled 2018-01-19 (×2): qty 2

## 2018-01-19 MED ORDER — MORPHINE SULFATE (PF) 4 MG/ML IV SOLN: 4.0000 mg | INTRAVENOUS | Status: DC | PRN

## 2018-01-19 NOTE — ED Notes (Signed)
Pt assisted to the side of the bed. Pt states "I can not walk I have had breaks before and something doesn't feel right." MD and RN notified.

## 2018-01-19 NOTE — ED Provider Notes (Signed)
Pt received at sign out with XR/labs pending. Please see previous EDP note for detailed HPI/H&P/MDM. Pt's workup reassuring. INR therapeutic. XR without acute findings. Pt remains A&O, resps without distress with chronic O2 N/C in place, abd soft/NT, neuro non-focal. Despite pt's own fentanyl patch, pt refuses to stand or walk due to increasing low back pain. Will need to transfer to [facility] ED to obtain MRI LS. Pt agreeable with plan.  T/C to [facility] EDP Dr. Leonides Schanz, case discussed, including:  HPI, pertinent PM/SHx, VS/PE, dx testing, ED course and treatment:  Agreeable to accept pt to ED to obtain MRI, dispo based on results.      Francine Graven, DO 01/19/18 2313

## 2018-01-19 NOTE — ED Notes (Signed)
Pt in xray

## 2018-01-19 NOTE — Progress Notes (Signed)
Pt brought to X-Ray for exams. Patient become agitated with staff for asking to verify her name and DOB.  Began Screaming, yelling, cursing, and was uncooperative. Security called; 3 Psychologist, educational responded. Pt was still very unhappy, but allowed exams to be performed. Pt remained unhappy, fussy, and cursed staff even while staff transported her back to ER. Pt immediately began telling ED nurse tech that X-Ray staff "did not like her" "they were mean to me" "they hurt me"

## 2018-01-19 NOTE — ED Notes (Signed)
Pt yelling out with pain when attempting to sit pt up and assist to side of bed in attempt to ambulate. Dr Thurnell Garbe called to room and talked with pt. No further attempt to ambulate at this time. Dr Thurnell Garbe explained pt would be sent to [facility] for MRI and further evaluation. Pt is to be NPO until MRI is done. Pt given phone to call her daughter.

## 2018-01-19 NOTE — ED Notes (Signed)
Pt is drowsy- pt has Fentanyl patch noted to right upper abdomen under breast. Pt says she cannot remember when it was put on.

## 2018-01-19 NOTE — ED Provider Notes (Signed)
[facility] Emergency Department Provider Note MRN:  [id number]  Arrival date & time: 01/19/18     Chief Complaint   Fall   History of Present Illness   [name] is a 68 y.o. year-old female with a history of CAD, COPD, factor V Leiden, pulmonary realism presenting to the ED with chief complaint of fall.  Patient explains that she had just woken up from sleep, "tried to do too much", thought she could quickly get up to standing position, fell to the ground onto her buttocks.  Felt a popping sensation in her back.  Denies dizziness or chest pain prior to the fall, denies head trauma, no headache or vision change, no chest pain or shortness of breath, no abdominal pain, no neck or mid back pain.  Endorsing low back pain, worse than her chronic pain in that region.  Also endorsing hip pain.  The pain is constant, 8 out of 10 in severity, worse with motion.  Review of Systems  A complete 10 system review of systems was obtained and all systems are negative except as noted in the HPI and PMH.   Patient's Health History    Past Medical History:  Diagnosis Date  . Acute MI ([place])    x4, code blue x3  . Arteriosclerotic cardiovascular disease (ASCVD) 2002   Inf STEMI-2002. 2003-cutting balloon + brachytherapy for restenosis; subsequent acute stent thrombosis 06/2010 requiring 2 separate interventions (Zeta stent, then repeat cath with thrombectomy). focal basal inf AK, nl EF; 03/2011: Patent stents, minor nonobst  residual dz, nl EF; neg stress nuclear in 2008 and stress echo in 2009  . Chronic anticoagulation    Warfarin plus ticagrelor  . Chronic respiratory failure ([place]) 08/27/2013   On 2L 02  . COPD (chronic obstructive pulmonary disease) ([place])    02 dependent  . COPD (chronic obstructive pulmonary disease) ([place])   . Factor 5 Leiden mutation, heterozygous ([place])   . Factor V Leiden, prothrombin gene mutation ([place]) 2006  . Hyperlipidemia   . Hypothyroidism   .  Noncompliance   . Pelvic fracture ([place]) 2009  . Pulmonary embolism ([place]) 2006   Associated with deep vein thrombosis-2006; + factor V Leiden  . Tobacco abuse    50 pack years    Past Surgical History:  Procedure Laterality Date  . COLONOSCOPY  Approximately 2000   Negative screening study  . CORONARY ANGIOPLASTY  2002, 2003, 2012  . LEFT AND RIGHT HEART CATHETERIZATION WITH CORONARY ANGIOGRAM N/A 04/03/2011   Procedure: LEFT AND RIGHT HEART CATHETERIZATION WITH CORONARY ANGIOGRAM;  Surgeon: Sherren Mocha, MD;  Location[facility] CATH LAB;  Service: Cardiovascular;  Laterality: N/A;    Family History  Problem Relation Age of Onset  . Emphysema Mother   . Alzheimer's disease Mother   . Emphysema Sister   . Heart disease Father   . Factor V Leiden deficiency Father   . Factor V Leiden deficiency Sister   . Factor V Leiden deficiency Daughter   . Kidney cancer Paternal Grandmother     Social History   Socioeconomic History  . Marital status: Single    Spouse name: Not on file  . Number of children: Not on file  . Years of education: Not on file  . Highest education level: Not on file  Occupational History  . Occupation: Disabled  . 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 Financial resource strain: Not on file  . Food insecurity:    Worry: Not on  file    Inability: Not on file  . Transportation needs:    Medical: Not on file    Non-medical: Not on file  Tobacco Use  . Smoking status: Current Some Day Smoker    Packs/day: 0.50    Years: 50.00    Pack years: 25.00    Types: Cigarettes    Start date: 05/10/1968  . Smokeless tobacco: Never Used  Substance and Sexual Activity  . Alcohol use: No    Alcohol/week: 0.0 standard drinks  . Drug use: No  . Sexual activity: Not on file  Lifestyle  . Physical activity:    Days per week: Not on file    Minutes per session: Not on file  . Stress: Not on file  Relationships  . Social connections:    Talks on phone: Not on file     Gets together: Not on file    Attends religious service: Not on file    Active member of club or organization: Not on file    Attends meetings of clubs or organizations: Not on file    Relationship status: Not on file  . Intimate partner violence:    Fear of current or ex partner: Not on file    Emotionally abused: Not on file    Physically abused: Not on file    Forced sexual activity: Not on file  Other Topics Concern  . Not on file  Social History Narrative   Originally from [place]. Previously has lived in [place], [place], [place] & moved back to [place] in 1994. 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 Ronie Spies was in her current home. She also had an owl living in her house as a rehab pet. She also had chickens until Fall 2015. No mold exposure.      Physical Exam  Vital Signs and Nursing Notes reviewed Vitals:   01/19/18 1935  BP: 117/60  Pulse: 90  Resp: 16  Temp: 97.8 F (36.6 C)  SpO2: (!) 88%    CONSTITUTIONAL: Chronically ill-appearing, NAD NEURO:  Alert and oriented x 3, no focal deficits EYES:  eyes equal and reactive ENT/NECK:  no LAD, no JVD CARDIO: Regular rate, well-perfused, normal S1 and S2 PULM:  CTAB no wheezing or rhonchi GI/GU:  normal bowel sounds, non-distended, non-tender MSK/SPINE:  No gross deformities, no edema; tenderness to palpation to the midline lumbar spine, bilateral hips SKIN:  no rash, atraumatic PSYCH:  Appropriate speech and behavior  Diagnostic and Interventional Summary    EKG Interpretation  Date/Time:  Saturday January 19 2018 20:36:03 EST Ventricular Rate:  89 PR Interval:    QRS Duration: 118 QT Interval:  369 QTC Calculation: 449 R Axis:   88 Text Interpretation:  Sinus rhythm Right atrial enlargement Nonspecific intraventricular conduction delay Minimal ST elevation, anterior leads Confirmed by Gerlene Fee [phone]) on 01/19/2018 8:47:54 PM      Labs Reviewed    CBC  BASIC METABOLIC PANEL  TROPONIN I  URINALYSIS, ROUTINE W REFLEX MICROSCOPIC    DG Pelvis 1-2 Views    (Results Pending)  DG Lumbar Spine Complete    (Results Pending)    Medications - No data to display   Procedures Critical Care  ED Course and Medical Decision Making  I have reviewed the triage vital signs and the nursing notes.  Pertinent labs & imaging results that were available during my care of the patient  were reviewed by me and considered in my medical decision making (see below for details).  68 year old female with multiple comorbidities here with what sounds like ground-level fall, acute worsening chronic back pain.  Patient is on 3 L nasal cannula at home at baseline, currently with no chest pain or shortness of breath, legs with no evidence of DVT.  Work-up pending.  Labs and imaging still pending.  If work-up is unrevealing, patient will need ambulatory assessment to determine appropriateness of discharge.  Signed out to Dr. Thurnell Garbe at shift change.  Barth Kirks. [place] Small, [place] [email or web address]  Final Clinical Impressions(s) / ED Diagnoses     ICD-10-CM   1. Fall W19.Merril Abbe DG Pelvis 1-2 Views    DG Pelvis 1-2 Views    ED Discharge Orders    None         Maudie Flakes, MD 01/19/18 2138

## 2018-01-19 NOTE — ED Triage Notes (Signed)
Pt reports she fell tonight in her living room landing on her buttocks, pt reports she felt a "pop" in her lower back where she is c/o pain now, pt reports hx of back injury that she currently is prescribed pain patches for

## 2018-01-20 ENCOUNTER — Emergency Department (HOSPITAL_COMMUNITY): Payer: Medicare Other

## 2018-01-20 ENCOUNTER — Encounter (HOSPITAL_COMMUNITY): Payer: Self-pay | Admitting: Internal Medicine

## 2018-01-20 ENCOUNTER — Observation Stay (HOSPITAL_COMMUNITY): Payer: Medicare Other

## 2018-01-20 DIAGNOSIS — J441 Chronic obstructive pulmonary disease with (acute) exacerbation: Principal | ICD-10-CM

## 2018-01-20 DIAGNOSIS — R918 Other nonspecific abnormal finding of lung field: Secondary | ICD-10-CM | POA: Diagnosis not present

## 2018-01-20 DIAGNOSIS — M8008XA Age-related osteoporosis with current pathological fracture, vertebra(e), initial encounter for fracture: Secondary | ICD-10-CM | POA: Diagnosis present

## 2018-01-20 DIAGNOSIS — S32030A Wedge compression fracture of third lumbar vertebra, initial encounter for closed fracture: Secondary | ICD-10-CM | POA: Diagnosis not present

## 2018-01-20 MED ORDER — ALBUTEROL SULFATE (2.5 MG/3ML) 0.083% IN NEBU: 2.5000 mg | INHALATION_SOLUTION | RESPIRATORY_TRACT | Status: DC | PRN

## 2018-01-20 MED ORDER — LIDOCAINE 5 % EX PTCH
1.0000 | MEDICATED_PATCH | CUTANEOUS | Status: DC
  Administered 2018-01-20 – 2018-01-24 (×5): 1 via TRANSDERMAL
  Filled 2018-01-20 (×5): qty 1

## 2018-01-20 MED ORDER — TICAGRELOR 60 MG PO TABS
60.0000 mg | ORAL_TABLET | Freq: Two times a day (BID) | ORAL | Status: DC
  Administered 2018-01-20 – 2018-01-24 (×8): 60 mg via ORAL
  Filled 2018-01-20 (×8): qty 1

## 2018-01-20 MED ORDER — WARFARIN SODIUM 5 MG PO TABS: 5.0000 mg | ORAL_TABLET | ORAL | Status: DC

## 2018-01-20 MED ORDER — HYDROCODONE-ACETAMINOPHEN 5-325 MG PO TABS
1.0000 | ORAL_TABLET | Freq: Four times a day (QID) | ORAL | Status: DC | PRN
  Administered 2018-01-20 – 2018-01-24 (×8): 1 via ORAL
  Filled 2018-01-20 (×8): qty 1

## 2018-01-20 MED ORDER — ACETAMINOPHEN 325 MG PO TABS: 650.0000 mg | ORAL_TABLET | Freq: Four times a day (QID) | ORAL | Status: DC | PRN

## 2018-01-20 MED ORDER — METHYLPREDNISOLONE SODIUM SUCC 40 MG IJ SOLR
40.0000 mg | Freq: Every day | INTRAMUSCULAR | Status: DC
  Administered 2018-01-20 – 2018-01-21 (×2): 40 mg via INTRAVENOUS
  Filled 2018-01-20 (×2): qty 1

## 2018-01-20 MED ORDER — IPRATROPIUM-ALBUTEROL 0.5-2.5 (3) MG/3ML IN SOLN
3.0000 mL | Freq: Once | RESPIRATORY_TRACT | Status: AC
  Administered 2018-01-20: 3 mL via RESPIRATORY_TRACT
  Filled 2018-01-20: qty 3

## 2018-01-20 MED ORDER — TRAZODONE HCL 50 MG PO TABS: 50.0000 mg | ORAL_TABLET | Freq: Every evening | ORAL | Status: DC | PRN

## 2018-01-20 MED ORDER — WARFARIN SODIUM 5 MG PO TABS: 5.0000 mg | ORAL_TABLET | Freq: Every day | ORAL | Status: DC

## 2018-01-20 MED ORDER — POLYETHYLENE GLYCOL 3350 17 G PO PACK
17.0000 g | PACK | Freq: Every day | ORAL | Status: DC | PRN
  Administered 2018-01-21 – 2018-01-22 (×2): 17 g via ORAL
  Filled 2018-01-20 (×2): qty 1

## 2018-01-20 MED ORDER — BUDESONIDE 0.5 MG/2ML IN SUSP
0.5000 mg | Freq: Two times a day (BID) | RESPIRATORY_TRACT | Status: DC
  Administered 2018-01-20 – 2018-01-24 (×8): 0.5 mg via RESPIRATORY_TRACT
  Filled 2018-01-20 (×8): qty 2

## 2018-01-20 MED ORDER — HYDROCODONE-ACETAMINOPHEN 5-325 MG PO TABS
1.0000 | ORAL_TABLET | Freq: Once | ORAL | Status: AC
  Administered 2018-01-20: 1 via ORAL
  Filled 2018-01-20: qty 1

## 2018-01-20 MED ORDER — IPRATROPIUM-ALBUTEROL 0.5-2.5 (3) MG/3ML IN SOLN
3.0000 mL | Freq: Four times a day (QID) | RESPIRATORY_TRACT | Status: DC
  Administered 2018-01-20: 3 mL via RESPIRATORY_TRACT
  Filled 2018-01-20: qty 3

## 2018-01-20 MED ORDER — IPRATROPIUM-ALBUTEROL 0.5-2.5 (3) MG/3ML IN SOLN
3.0000 mL | Freq: Three times a day (TID) | RESPIRATORY_TRACT | Status: DC
  Administered 2018-01-21 – 2018-01-24 (×11): 3 mL via RESPIRATORY_TRACT
  Filled 2018-01-20 (×10): qty 3

## 2018-01-20 MED ORDER — LEVOTHYROXINE SODIUM 25 MCG PO TABS
125.0000 ug | ORAL_TABLET | Freq: Every day | ORAL | Status: DC
  Administered 2018-01-21 – 2018-01-24 (×4): 125 ug via ORAL
  Filled 2018-01-20 (×6): qty 1

## 2018-01-20 MED ORDER — NICOTINE 14 MG/24HR TD PT24
14.0000 mg | MEDICATED_PATCH | Freq: Every day | TRANSDERMAL | Status: DC
  Administered 2018-01-20: 14 mg via TRANSDERMAL
  Filled 2018-01-20 (×3): qty 1

## 2018-01-20 MED ORDER — IPRATROPIUM-ALBUTEROL 0.5-2.5 (3) MG/3ML IN SOLN
3.0000 mL | RESPIRATORY_TRACT | Status: DC | PRN
  Administered 2018-01-20 (×2): 3 mL via RESPIRATORY_TRACT
  Filled 2018-01-20 (×2): qty 3

## 2018-01-20 MED ORDER — FENTANYL 50 MCG/HR TD PT72
50.0000 ug | MEDICATED_PATCH | TRANSDERMAL | Status: DC
  Administered 2018-01-20 – 2018-01-23 (×3): 50 ug via TRANSDERMAL
  Filled 2018-01-20 (×4): qty 1

## 2018-01-20 MED ORDER — BENZONATATE 100 MG PO CAPS: 100.0000 mg | ORAL_CAPSULE | Freq: Two times a day (BID) | ORAL | Status: DC | PRN

## 2018-01-20 MED ORDER — NITROGLYCERIN 0.4 MG SL SUBL: 0.4000 mg | SUBLINGUAL_TABLET | SUBLINGUAL | Status: DC | PRN

## 2018-01-20 MED ORDER — BACLOFEN 5 MG HALF TABLET
5.0000 mg | ORAL_TABLET | Freq: Three times a day (TID) | ORAL | Status: DC
  Administered 2018-01-20 – 2018-01-23 (×9): 5 mg via ORAL
  Filled 2018-01-20 (×9): qty 1

## 2018-01-20 MED ORDER — WARFARIN - PHARMACIST DOSING INPATIENT
Freq: Every day | Status: DC
  Administered 2018-01-20 – 2018-01-23 (×2)

## 2018-01-20 MED ORDER — WARFARIN SODIUM 3 MG PO TABS
6.0000 mg | ORAL_TABLET | ORAL | Status: DC
  Administered 2018-01-20: 6 mg via ORAL
  Filled 2018-01-20: qty 1
  Filled 2018-01-20: qty 2

## 2018-01-20 MED ORDER — METHYLPREDNISOLONE SODIUM SUCC 125 MG IJ SOLR
125.0000 mg | Freq: Once | INTRAMUSCULAR | Status: AC
  Administered 2018-01-20: 125 mg via INTRAVENOUS
  Filled 2018-01-20: qty 2

## 2018-01-20 MED ORDER — FERROUS SULFATE 325 (65 FE) MG PO TABS
325.0000 mg | ORAL_TABLET | Freq: Every day | ORAL | Status: DC
  Administered 2018-01-22 – 2018-01-24 (×3): 325 mg via ORAL
  Filled 2018-01-20 (×4): qty 1

## 2018-01-20 MED ORDER — ACETAMINOPHEN 650 MG RE SUPP: 650.0000 mg | Freq: Four times a day (QID) | RECTAL | Status: DC | PRN

## 2018-01-20 MED ORDER — ARFORMOTEROL TARTRATE 15 MCG/2ML IN NEBU
15.0000 ug | INHALATION_SOLUTION | Freq: Two times a day (BID) | RESPIRATORY_TRACT | Status: DC
  Administered 2018-01-20 – 2018-01-24 (×8): 15 ug via RESPIRATORY_TRACT
  Filled 2018-01-20 (×8): qty 2

## 2018-01-20 NOTE — Progress Notes (Signed)
Orthopedic Tech Progress Note Patient Details:  [name] 1950/09/24 [id number]  Patient ID: [name], female   DOB: Jun 21, 1950, 68 y.o.   MRN: [id number]   [employer] for TLSO brace. 01/20/2018, 7:49 AM

## 2018-01-20 NOTE — ED Notes (Signed)
Patient transported to MRI 

## 2018-01-20 NOTE — ED Notes (Signed)
Patient arrives via [facility] from [facility] ed for MRI for back pain. Report from [facility] received. Pain 5/10 en route for [facility]

## 2018-01-20 NOTE — ED Triage Notes (Signed)
TC to Ortho Tech to report Pt has an order for TLSO brace

## 2018-01-20 NOTE — Clinical Social Work Note (Signed)
Clinical Social Work Assessment  Patient Details  Name: [name] MRN: [id number] Date of Birth: 04/14/1950  Date of referral:  01/20/18               Reason for consult:  Housing Concerns/Homelessness(Concerns with social supports at home)                Permission sought to share information with:  Family Supports Permission granted to share information::  Yes, Verbal Permission Granted  Name::        Agency::     Relationship::     Contact Information:     Housing/Transportation Living arrangements for the past 2 months:  Single Family Home Source of Information:  Patient Patient Interpreter Needed:  None Criminal Activity/Legal Involvement Pertinent to Current Situation/Hospitalization:  No - Comment as needed Significant Relationships:  Adult Children Lives with:  Self Do you feel safe going back to the place where you live?  Yes Need for family participation in patient care:  No (Coment)  Care giving concerns:  CSW notes patient lives alone and closest relative is a daughter that lives in [place], [place]. [place] notes patient reports having home health in the past but reports it was not helpful.   Social Worker assessment / plan:  CSW will monitor patient as medical work up progresses. Patient appears to be admitted to the hospital pending available bed. CSW will follow for PT/OT recommendations.   Employment status:  Retired Forensic scientist:  Medicare PT Recommendations:  (Pending) Information / Referral to community resources:     Patient/Family's Response to care:  Patient was open to the possibility of having to be in a placement or returning to home with home health. Patient was very open about feeling like she did not need help.   Patient/Family's Understanding of and Emotional Response to Diagnosis, Current Treatment, and Prognosis:  *Patient was sociable and understanding although has some reservations with having home health or assistance in  general.  Emotional Assessment Appearance:  Appears older than stated age Attitude/Demeanor/Rapport:  Self-Confident, Charismatic Affect (typically observed):  Accepting, Appropriate, Calm Orientation:  Oriented to Place, Oriented to Self, Oriented to  Time, Oriented to Situation Alcohol / Substance use:  Not Applicable Psych involvement (Current and /or in the community):  No (Comment)  Discharge Needs  Concerns to be addressed:  Discharge Planning Concerns, Home Safety Concerns, Lack of Support Readmission within the last 30 days:    Current discharge risk:  Lives alone Barriers to Discharge:  Unsafe home situation(Patient lives alone, refuses home health, closest relative is daughter in [place], [place].)   Oretha Milch, [place] 01/20/2018, 2:44 PM

## 2018-01-20 NOTE — Consult Note (Addendum)
Chief Complaint   Chief Complaint  Patient presents with  . Fall  . COPD    HPI   Consult requested by: Dr Ronnald Nian Reason for consult: L3 compression fracture  HPI: Ski Polich is a 68 y.o. female who presented to [facility] ER after a fall at home prior to arrival.  Remembers losing balance after standing up too quickly and landing on buttocks. Developed severe LBP afterwards, improved since being medicated in the ER. Pain worse with movement. Denies radicular symptoms. Has chronic LBP at baseline. Denies weakness in LE or bowel/bladder dysfunction. Xray performed at [facility] ER was read as normal so she was transferred to [facility] for MRI which revealed fx. NS consultation was requested.  PMHx reviewed as below in addition to medications. She is currently admitted for COPD exacerbation in addition to pain control for L3 fracture.  Patient Active Problem List   Diagnosis Date Noted  . Vertebral fracture, osteoporotic ([place]) 01/20/2018  . Rectal bleeding 09/05/2017  . Left lower quadrant pain 09/05/2017  . Oxygen dependent 09/05/2017  . Chronic back pain 05/16/2017  . COPD exacerbation ([place]) 02/19/2017  . Tachycardia 02/19/2017  . Influenza A 02/19/2017  . Acute respiratory failure ([place]) 02/19/2017  . Dyspnea 11/26/2014  . Loss of weight 11/26/2014  . Chronic respiratory failure ([place]) 08/27/2013  . Chest pain 08/24/2013  . Encounter for therapeutic drug monitoring 02/13/2013  . Abnormal weight loss 03/21/2012  . Cerebrovascular disease 11/15/2011  . CAD S/P percutaneous coronary angioplasty   . Factor V Leiden, prothrombin gene mutation ([place])   . Hyperlipidemia   . Chronic anticoagulation   . Hypothyroidism 11/23/2006  . TOBACCO ABUSE 11/23/2006  . History of pulmonary embolus (PE) 11/23/2006  . 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 Acute MI ([place])    x4, code blue x3  . Arteriosclerotic  cardiovascular disease (ASCVD) 2002   Inf STEMI-2002. 2003-cutting balloon + brachytherapy for restenosis; subsequent acute stent thrombosis 06/2010 requiring 2 separate interventions (Zeta stent, then repeat cath with thrombectomy). focal basal inf AK, nl EF; 03/2011: Patent stents, minor nonobst  residual dz, nl EF; neg stress nuclear in 2008 and stress echo in 2009  . Chronic anticoagulation    Warfarin plus ticagrelor  . Chronic respiratory failure ([place]) 08/27/2013   On 2L 02  . COPD (chronic obstructive pulmonary disease) ([place])    02 dependent  . COPD (chronic obstructive pulmonary disease) ([place])   . Factor 5 Leiden mutation, heterozygous ([place])   . Factor V Leiden, prothrombin gene mutation ([place]) 2006  . Hyperlipidemia   . Hypothyroidism   . Noncompliance   . Pelvic fracture ([place]) 2009  . Pulmonary embolism ([place]) 2006   Associated with deep vein thrombosis-2006; + factor V Leiden  . Tobacco abuse    50 pack years    PSH: Past Surgical History:  Procedure Laterality Date  . COLONOSCOPY  Approximately 2000   Negative screening study  . CORONARY ANGIOPLASTY  2002, 2003, 2012  . LEFT AND RIGHT HEART CATHETERIZATION WITH CORONARY ANGIOGRAM N/A 04/03/2011   Procedure: LEFT AND RIGHT HEART CATHETERIZATION WITH CORONARY ANGIOGRAM;  Surgeon: Sherren Mocha, MD;  Location: [facility] CATH [facility];  Service: Cardiovascular;  Laterality: N/A;    (Not in a hospital admission)   SH: Social History   Tobacco Use  . Smoking status: Current Some Day Smoker    Packs/day: 0.50    Years: 50.00  Pack years: 25.00    Types: Cigarettes    Start date: 05/10/1968  . Smokeless tobacco: Never Used  Substance Use Topics  . Alcohol use: No    Alcohol/week: 0.0 standard drinks  . Drug use: No    MEDS: Prior to Admission medications   Medication Sig Start Date End Date Taking? Authorizing Provider  albuterol (PROVENTIL) (2.5 MG/3ML) 0.083% nebulizer solution USE 1 VIAL BY NEBULIZER EVERY 4 HOURS AS  NEEDED FOR WHEEZING. DX: J44.9 Patient taking differently: Take 2.5 mg by nebulization every 4 (four) hours as needed for wheezing or shortness of breath.  01/24/17  Yes Magdalen Spatz, NP  arformoterol (BROVANA) 15 MCG/2ML NEBU Take 2 mLs (15 mcg total) by nebulization 2 (two) times daily. 09/07/17  Yes Juanito Doom, MD  benzonatate (TESSALON) 100 MG capsule Take 1 capsule (100 mg total) by mouth 2 (two) times daily as needed for cough. 08/30/17  Yes Nafziger, Tommi Rumps, NP  fentaNYL (DURAGESIC - DOSED MCG/HR) 50 MCG/HR Place 50 mcg onto the skin every 3 (three) days.   Yes [provider]  ferrous sulfate 325 (65 FE) MG tablet Take 325 mg by mouth daily with breakfast.   Yes [provider]  levothyroxine (SYNTHROID, LEVOTHROID) 125 MCG tablet Take 1 tablet (125 mcg total) by mouth daily. 08/16/17  Yes Nafziger, Tommi Rumps, NP  nitroGLYCERIN (NITROSTAT) 0.4 MG SL tablet Place 1 tablet (0.4 mg total) under the tongue every 5 (five) minutes as needed for chest pain. 10/28/14  Yes Herminio Commons, MD  Omega-3 Fatty Acids (FISH OIL) 600 MG CAPS Take 1 capsule by mouth 3 (three) times daily.   Yes [provider]  OXYGEN Inhale 3 L into the lungs continuous.    Yes [provider]  ticagrelor (BRILINTA) 60 MG TABS tablet TAKE 1 TABLET(60 MG) BY MOUTH TWICE DAILY Patient taking differently: Take 60 mg by mouth 2 (two) times daily.  10/05/17  Yes Herminio Commons, MD  warfarin (COUMADIN) 1 MG tablet Take 1 tablet (1mg ) on Sunday, Tuesday, Thursday and Saturday along with a 5mg  tablet or as directed 11/07/17  Yes Herminio Commons, MD  warfarin (COUMADIN) 5 MG tablet Take 5 mg by mouth daily.   Yes [provider]  budesonide (PULMICORT) 0.5 MG/2ML nebulizer solution Take 2 mLs (0.5 mg total) by nebulization 2 (two) times daily. Dx: J43.9 01/24/17   Magdalen Spatz, NP  ipratropium-albuterol (DUONEB) 0.5-2.5 (3) MG/3ML SOLN Take 3 mLs by nebulization 4 (four)  times daily. 03/20/17   Magdalen Spatz, NP  Respiratory Therapy Supplies (FLUTTER) DEVI Use as directed 05/14/15   Javier Glazier, MD  traZODone (DESYREL) 50 MG tablet Take 50 mg by mouth at bedtime as needed for sleep.    [provider]    ALLERGY: Allergies  Allergen Reactions  . Dilaudid [Hydromorphone Hcl] Hives and Nausea Only  . Minocycline Hcl     REACTION: Dizzy  . Prednisone     REACTION: feels like throat swelling, hallucinations  . Varenicline Tartrate     REACTION: Dizzy(chantix)   . Zocor [Simvastatin - High Dose] Other (See Comments)    myalgia    Social History   Tobacco Use  . Smoking status: Current Some Day Smoker    Packs/day: 0.50    Years: 50.00    Pack years: 25.00    Types: Cigarettes    Start date: 05/10/1968  . Smokeless tobacco: Never Used  Substance Use Topics  .  Alcohol use: No    Alcohol/week: 0.0 standard drinks     Family History  Problem Relation Age of Onset  . Emphysema Mother   . Alzheimer's disease Mother   . Emphysema Sister   . Heart disease Father   . Factor V Leiden deficiency Father   . Factor V Leiden deficiency Sister   . Factor V Leiden deficiency Daughter   . Kidney cancer Paternal Grandmother      ROS   Review of Systems  Constitutional: Negative for chills and fever.  Eyes: Negative for blurred vision, double vision and photophobia.  Gastrointestinal: Negative for nausea and vomiting.  Musculoskeletal: Positive for back pain, falls and myalgias. Negative for neck pain.  Neurological: Negative for dizziness, tingling, tremors, sensory change, speech change, focal weakness, seizures, loss of consciousness, weakness and headaches.    Exam   Vitals:   01/20/18 1030 01/20/18 1334  BP: 113/75   Pulse: 98 88  Resp:  20  Temp:  98.9 F (37.2 C)  SpO2: 94% 97%   General appearance: elderly female, resting comfortably, TLSO brace in place Eyes: No scleral injection Cardiovascular: Regular rate and  rhythm without murmurs, rubs, gallops. No edema or variciosities. Distal pulses normal. Pulmonary: Effort normal, non-labored breathing Musculoskeletal:     Muscle tone upper extremities: Normal    Muscle tone lower extremities: Normal    Motor exam: Upper Extremities Deltoid Bicep Tricep Grip  Right 5/5 5/5 5/5 5/5  Left 5/5 5/5 5/5 5/5   Lower Extremity IP Quad PF DF EHL  Right 5/5 5/5 5/5 5/5 5/5  Left 5/5 5/5 5/5 5/5 5/5   Neurological Mental Status:    - Patient is awake, alert, oriented to person and situation    - No signs of aphasia or neglect Cranial Nerves    - II: Visual Fields are full. PERRL    - III/IV/VI: EOMI without ptosis or diploplia.     - V: Facial sensation is grossly normal    - VII: Facial movement is symmetric.     - VIII: hearing is intact to voice    - X: Uvula elevates symmetrically    - XI: Shoulder shrug is symmetric.    - XII: tongue is midline without atrophy or fasciculations.  Sensory: Sensation grossly intact to LT Deep Tendon Reflexes    - 2+ and symmetric in the biceps and patellae.   Results - Imaging/Labs   Results for orders placed or performed during the hospital encounter of 01/19/18 (from the past 48 hour(s))  CBC     Status: Abnormal   Collection Time: 01/19/18  9:21 PM  Result Value Ref Range   WBC 9.3 4.0 - 10.5 K/uL   RBC 3.85 (L) 3.87 - 5.11 MIL/uL   Hemoglobin 11.1 (L) 12.0 - 15.0 g/dL   HCT 38.6 36.0 - 46.0 %   MCV 100.3 (H) 80.0 - 100.0 fL   MCH 28.8 26.0 - 34.0 pg   MCHC 28.8 (L) 30.0 - 36.0 g/dL   RDW 15.9 (H) 11.5 - 15.5 %   Platelets 211 150 - 400 K/uL   nRBC 0.0 0.0 - 0.2 %    Comment: Performed at [facility], [address], [place], [place] [address]  Basic metabolic panel     Status: Abnormal   Collection Time: 01/19/18  9:21 PM  Result Value Ref Range   Sodium 140 135 - 145 mmol/L   Potassium 3.8 3.5 - 5.1 mmol/L   Chloride 96 (  L) 98 - 111 mmol/L   CO2 37 (H) 22 - 32 mmol/L   Glucose, Bld 160 (H) 70 -  99 mg/dL   BUN 21 8 - 23 mg/dL   Creatinine, Ser 0.89 0.44 - 1.00 mg/dL   Calcium 8.8 (L) 8.9 - 10.3 mg/dL   GFR calc non Af Amer >60 >60 mL/min   GFR calc Af Amer >60 >60 mL/min   Anion gap 7 5 - 15    Comment: Performed at [facility], [address], [place], [place] [address]  Troponin I - ONCE - STAT     Status: None   Collection Time: 01/19/18  9:21 PM  Result Value Ref Range   Troponin I <0.03 <0.03 ng/mL    Comment: Performed at [facility], [address] [place], [place] [address]  Protime-INR     Status: Abnormal   Collection Time: 01/19/18  9:21 PM  Result Value Ref Range   Prothrombin Time 28.0 (H) 11.4 - 15.2 seconds   INR 2.67     Comment: Performed at [facility], [address] [place], [place] [address]  Urinalysis, Routine w reflex microscopic     Status: Abnormal   Collection Time: 01/19/18  9:30 PM  Result Value Ref Range   Color, Urine YELLOW YELLOW   APPearance HAZY (A) CLEAR   Specific Gravity, Urine 1.019 1.005 - 1.030   pH 6.0 5.0 - 8.0   Glucose, UA NEGATIVE NEGATIVE mg/dL   Hgb urine dipstick LARGE (A) NEGATIVE   Bilirubin Urine NEGATIVE NEGATIVE   Ketones, ur NEGATIVE NEGATIVE mg/dL   Protein, ur 100 (A) NEGATIVE mg/dL   Nitrite NEGATIVE NEGATIVE   Leukocytes, UA NEGATIVE NEGATIVE   RBC / HPF >50 (H) 0 - 5 RBC/hpf   WBC, UA 6-10 0 - 5 WBC/hpf   Bacteria, UA NONE SEEN NONE SEEN   Squamous Epithelial / LPF 0-5 0 - 5    Comment: Performed at [facility], [address], [place], [place] [address]  I-stat troponin, ED     Status: None   Collection Time: 01/19/18  9:35 PM  Result Value Ref Range   Troponin i, poc 0.01 0.00 - 0.08 ng/mL   Comment 3            Comment: Due to the release kinetics of cTnI, a negative result within the first hours of the onset of symptoms does not rule out myocardial infarction with certainty. If myocardial infarction is still suspected, repeat the test at appropriate intervals.     Dg Lumbar Spine  Complete  Result Date: 01/19/2018 CLINICAL DATA:  [place] tonight with pop in the low back. Low back pain. EXAM: LUMBAR SPINE - COMPLETE 4+ VIEW COMPARISON:  None. FINDINGS: Examination is technically limited due to multiple superimposed leads and positioning. Normal alignment of the lumbar spine. Normal alignment of the lumbar vertebrae. No vertebral compression deformities. No focal bone lesion or bone destruction. Mild disc space narrowing and endplate hypertrophic changes consistent with mild degenerative change. Visualized sacrum appears intact. IMPRESSION: Mild degenerative changes in the lumbar spine. Normal alignment. No acute displaced fractures identified. Electronically Signed   By: Lucienne Capers M.D.   On: 01/19/2018 22:26   Dg Pelvis 1-2 Views  Result Date: 01/19/2018 CLINICAL DATA:  Patient fell tonight, feeling a pop in the low back. Now low back pain. EXAM: PELVIS - 1-2 VIEW COMPARISON:  None. FINDINGS: There is no evidence of pelvic fracture or diastasis. No pelvic bone lesions  are seen. IMPRESSION: Negative. Electronically Signed   By: Lucienne Capers M.D.   On: 01/19/2018 22:24   Mr Lumbar Spine Wo Contrast  Result Date: 01/20/2018 CLINICAL DATA:  Fall yesterday.  Pain. EXAM: MRI LUMBAR SPINE WITHOUT CONTRAST TECHNIQUE: Multiplanar, multisequence MR imaging of the lumbar spine was performed. No intravenous contrast was administered. COMPARISON:  Lumbar spine radiographs 01/19/2018 FINDINGS: Segmentation: 5 non rib-bearing lumbar type vertebral bodies are present. The lowest fully formed vertebral body is L5. Alignment:  AP alignment is anatomic. Vertebrae: Remote superior endplate fractures are present at L1 and L5. A superior endplate fracture of L3 on the right demonstrates marrow signal change battle with acute/subacute fracture. There is 30% loss of height anteriorly. Stir sequences demonstrate edema extending into the right pedicle. There is no retropulsed bone. Conus medullaris  and cauda equina: Conus extends to the L1-2 level. Conus and cauda equina appear normal. Paraspinal and other soft tissues: Limited imaging the abdomen is unremarkable. There is no significant adenopathy. No solid organ lesions are present. Disc levels: L1-2: Negative. L2-3: Mild disc bulging is present without significant stenosis. L3-4: Mild disc bulging is present. There is no significant stenosis. L4-5: A broad-based disc protrusion is present. Mild facet hypertrophy is present bilaterally. Mild subarticular and foraminal narrowing is present bilaterally. L5-S1: A shallow central disc protrusion is present. The central canal is patent. Foramina are patent bilaterally. IMPRESSION: 1. Acute/subacute superior endplate fracture of L3 on the right with approximately 30% loss of height. No retropulsed bone or associated stenosis. 2. Remote superior endplate fractures at L1 and L5. 3. Broad-based disc protrusion at L4-5 with mild subarticular and foraminal stenosis bilaterally. Electronically Signed   By: San Morelle M.D.   On: 01/20/2018 06:51   Dg Chest Portable 1 View  Result Date: 01/20/2018 CLINICAL DATA:  Pain. EXAM: PORTABLE CHEST 1 VIEW COMPARISON:  09/19/2017 FINDINGS: 1122 hours. The cardio pericardial silhouette is enlarged. There is pulmonary vascular congestion without overt pulmonary edema. Interstitial markings are diffusely coarsened with chronic features. Hazy opacity in the right suprahilar lung may be related to positioning. The visualized bony structures of the thorax are intact. IMPRESSION: Hazy opacity suprahilar right lung may be related to positioning. Dedicated upright PA film recommended when patient is able, to further evaluate. Electronically Signed   By: Misty Stanley M.D.   On: 01/20/2018 13:47    Impression/Plan   68 y.o. female with L3 compression fracture after fall at home. Currently admitted for treatment of COPD exacerbation and pain control. L3 fracture is 30%  height loss and without retropulsion. She is neurologically intact. No indication for NS intervention. Will treat with TLSO brace which is to be worn when upright and OOB which she already has on. Plan for f/u in 3-4 weeks outpt to monitor fracture radiographically. Please call for any concerns.

## 2018-01-20 NOTE — ED Notes (Signed)
Pt requesting to eat. Pt informed that she is NPO until her MRI results per previous note in EMR.

## 2018-01-20 NOTE — Progress Notes (Signed)
ANTICOAGULATION CONSULT NOTE - Initial Consult  Pharmacy Consult for Coumadin Indication: factor V leiden  def, h/o PE  Allergies  Allergen Reactions  . Dilaudid [Hydromorphone Hcl] Hives and Nausea Only  . Minocycline Hcl     REACTION: Dizzy  . Prednisone     REACTION: feels like throat swelling, hallucinations  . Varenicline Tartrate     REACTION: Dizzy(chantix)   . Zocor [Simvastatin - High Dose] Other (See Comments)    myalgia    Patient Measurements: Height: 5\' 4"  (162.6 cm) Weight: 150 lb (68 kg) IBW/kg (Calculated) : 54.7  Vital Signs: Temp Source: Oral (01/12 1003) BP: 174/61 (01/12 1003) Pulse Rate: 94 (01/12 1003)  Labs: Recent Labs    01/19/18 2121  HGB 11.1*  HCT 38.6  PLT 211  LABPROT 28.0*  INR 2.67  CREATININE 0.89  TROPONINI <0.03    Estimated Creatinine Clearance: 58.1 mL/min (by C-G formula based on SCr of 0.89 mg/dL).   Medical History: Past Medical History:  Diagnosis Date  . Acute MI ([place])    x4, code blue x3  . Arteriosclerotic cardiovascular disease (ASCVD) 2002   Inf STEMI-2002. 2003-cutting balloon + brachytherapy for restenosis; subsequent acute stent thrombosis 06/2010 requiring 2 separate interventions (Zeta stent, then repeat cath with thrombectomy). focal basal inf AK, nl EF; 03/2011: Patent stents, minor nonobst  residual dz, nl EF; neg stress nuclear in 2008 and stress echo in 2009  . Chronic anticoagulation    Warfarin plus ticagrelor  . Chronic respiratory failure ([place]) 08/27/2013   On 2L 02  . COPD (chronic obstructive pulmonary disease) ([place])    02 dependent  . COPD (chronic obstructive pulmonary disease) ([place])   . Factor 5 Leiden mutation, heterozygous ([place])   . Factor V Leiden, prothrombin gene mutation ([place]) 2006  . Hyperlipidemia   . Hypothyroidism   . Noncompliance   . Pelvic fracture ([place]) 2009  . Pulmonary embolism ([place]) 2006   Associated with deep vein thrombosis-2006; + factor V Leiden  . Tobacco abuse    50  pack years   Assessment: CC/HPI: Fall with unrelenting pain. XIP:[id number] degenerative changes with acute L3 compression fracture. Also COPD exac,   PMH: MI x 4, COPD on home O2, factor V leiden  def, h/o PE  Anticoag: Warfarin PTA for factor V leiden  def, h/o PE, Hgb 11.1 - PTA dose: 5mg  MWF and 6mg  on TTSS with admit INR 2.67  Goal of Therapy:  INR 2-3 Monitor platelets by anticoagulation protocol: Yes   Plan:  Resume Coumadin  5mg  MWF and 6mg  on TTSS Daily INR  Keawe Marcello S. Alford Highland, PharmD, [place] Clinical Staff Pharmacist Eilene Ghazi Stillinger 01/20/2018,1:13 PM

## 2018-01-20 NOTE — ED Notes (Addendum)
Patient started yelling at staff "I'm hungry. I haven't eaten in four days and no one has checked on me". Informed patient I have come to check on her and she has been resting comfortably and her vital signs have been stable. MRI is backed up and she cannot eat until after the MRI is resulted.

## 2018-01-20 NOTE — Progress Notes (Signed)
  NEUROSURGERY PROGRESS NOTE   Received call from Dr Ronnald Nian regarding patient.  [name] is a 68 year old female with hx MI x4 COPD on supplemental O2, factor V leiden on anticoag who presented to [facility] ED yesterday after a fall. She was transferred to [facility] due to unrelenting pain for an MRI after Xray was read as normal.  MIR revealed multilevel degenerative changes with acute L3 compression fracture so NSY consulted.  Xray and MRI both reviewed. There is a L3 compression fracture with approx 30% heigh loss. Patient is neuro intact. Is going to be admitted for pain control as well as possible COPD exacerbation.  She will need to wear her TLSO which she has already been fitted for when OOB and upright.   Full consult to follow.

## 2018-01-20 NOTE — ED Provider Notes (Addendum)
1:15 AM  Patient transferred from [facility] ED. Patient is a 68 year old female who had a fall today.  Fell down onto her bottom and felt a pop in her lower back.  Unable to ambulate at [facility] today secondary to pain in her back.  Xrays negative.  Sent here for an MRI of her lumbar spine without contrast.  MRI has been ordered.  She appears comfortable currently.  Patient denies numbness, weakness, incontinence.  She is on Coumadin for factor V Leiden.  She is on oxygen for COPD.  6:00 AM  Pt asking for breathing treatment.  Will order duo nebs as needed.  There has been significant delay in MRI.  Patient has been updated.  She is receiving fentanyl as needed for pain.   7:00 AM  Pt has an acute superior endplate fracture of L3 with approximately 30% loss of height.  No retropulsion noted or stenosis.  She has remote fractures of L1 and L5.  There is also a broad-based disc protrusion at L4-L5 with mild subarticular and foraminal stenosis bilaterally.  She is not having radicular symptoms.  Her pain is been well controlled here and she has only required 1 dose of IV fentanyl.  Will order TLSO brace and then attempt to ambulate patient.  She is comfortable with this plan.  If she is able to ambulate she will be discharged home with pain medication and outpatient follow-up.  If unable to ambulate, patient will be admitted for pain control.  I reviewed all nursing notes, vitals, pertinent previous records, EKGs, lab and urine results, imaging (as available).        Ward, Delice Bison, DO 01/20/18 0710

## 2018-01-20 NOTE — ED Notes (Signed)
Fentanyl 50 mcg patch removed from right rib cage area,

## 2018-01-20 NOTE — H&P (Signed)
History and Physical    [name] HYW:[id number] DOB: [birth year] DOA: 01/19/2018  PCP: [name], NP  Patient coming from: Home , initially went to local ER and transferred to [facility].   I have personally briefly reviewed patient's old medical records in [place] and Care everywhere.   Chief Complaint: Fall.  Shortness of breath and wheezing.  HPI: [name] is a 68 y.o. female with medical history significant of COPD, chronic hypoxia on 2 to 3 L of oxygen, ongoing smoker, coronary artery disease, factor V Leyden and history of pulmonary embolism on Coumadin who went to [facility] emergency room with fall at home.  Patient is poor historian.  For anything you ask she says going on for a while.  As per patient, she fell on the ground at home last night when she was trying to get up quick to go the bathroom.  She started hurting her back.  She complained of back pain, lower back, dull ache, about 5 out of 10, no radiation, pain aggravated by ambulation and not relieved by her fentanyl at home.  Patient also complains of ongoing shortness of breath that is recently getting worse, she has wheezing.  She continues to smoke.  No fever or chills.  No flulike symptoms.  As stated, patient is poor historian, she says she always has wheezing and this is nothing new.  Patient however denies any other injuries.  Denies any chest pain.  Denies any abdominal pain.  There was no urinary or bowel incontinence.  Patient was suspected to have vertebral fracture, she was transferred to [facility] emergency room for MRI of the spine.  ED Course: Patient was transferred to [facility], ER for MRI of the spine.  She was found to have L3 compression fracture with 30% loss of height, no neurological deficits.  Seen by spinal surgery and suggested TLSO brace and ambulation as well pain relief.  No surgical intervention was planned.  However, while patient is in the ER, ER physician noted that she is obviously wheezing,  she is poor historian and they thought she has COPD exacerbation.  As patient has ongoing pain, difficulty breathing due to pain and COPD, needing medical monitoring and treatment.  Patient is slightly hypertensive in the ER.  Her INR is therapeutic.  She does not have any evidence of active bleeding.  Review of Systems: As per HPI otherwise 10 point review of systems negative.    Past Medical History:  Diagnosis Date  . Acute MI ([place])    x4, code blue x3  . Arteriosclerotic cardiovascular disease (ASCVD) 2002   Inf STEMI-2002. 2003-cutting balloon + brachytherapy for restenosis; subsequent acute stent thrombosis 06/2010 requiring 2 separate interventions (Zeta stent, then repeat cath with thrombectomy). focal basal inf AK, nl EF; 03/2011: Patent stents, minor nonobst  residual dz, nl EF; neg stress nuclear in 2008 and stress echo in 2009  . Chronic anticoagulation    Warfarin plus ticagrelor  . Chronic respiratory failure ([place]) 08/27/2013   On 2L 02  . COPD (chronic obstructive pulmonary disease) ([place])    02 dependent  . COPD (chronic obstructive pulmonary disease) ([place])   . Factor 5 Leiden mutation, heterozygous ([place])   . Factor V Leiden, prothrombin gene mutation ([place]) 2006  . Hyperlipidemia   . Hypothyroidism   . Noncompliance   . Pelvic fracture ([place]) 2009  . Pulmonary embolism ([place]) 2006   Associated with deep vein thrombosis-2006; + factor V Leiden  . Tobacco abuse  50 pack years    Past Surgical History:  Procedure Laterality Date  . COLONOSCOPY  Approximately 2000   Negative screening study  . CORONARY ANGIOPLASTY  2002, 2003, 2012  . LEFT AND RIGHT HEART CATHETERIZATION WITH CORONARY ANGIOGRAM N/A 04/03/2011   Procedure: LEFT AND RIGHT HEART CATHETERIZATION WITH CORONARY ANGIOGRAM;  Surgeon: Sherren Mocha, MD;  Location: [facility] CATH LAB;  Service: Cardiovascular;  Laterality: N/A;     reports that she has been smoking cigarettes. She started smoking about 49 years ago. She  has a 25.00 pack-year smoking history. She has never used smokeless tobacco. She reports that she does not drink alcohol or use drugs.  Allergies  Allergen Reactions  . Dilaudid [Hydromorphone Hcl] Hives and Nausea Only  . Minocycline Hcl     REACTION: Dizzy  . Prednisone     REACTION: feels like throat swelling, hallucinations  . Varenicline Tartrate     REACTION: Dizzy(chantix)   . Zocor [Simvastatin - High Dose] Other (See Comments)    myalgia    Family History  Problem Relation Age of Onset  . Emphysema Mother   . Alzheimer's disease Mother   . Emphysema Sister   . Heart disease Father   . Factor V Leiden deficiency Father   . Factor V Leiden deficiency Sister   . Factor V Leiden deficiency Daughter   . Kidney cancer Paternal Grandmother      Prior to Admission medications   Medication Sig Start Date End Date Taking? Authorizing Provider  albuterol (PROVENTIL) (2.5 MG/3ML) 0.083% nebulizer solution USE 1 VIAL BY NEBULIZER EVERY 4 HOURS AS NEEDED FOR WHEEZING. DX: J44.9 Patient taking differently: Take 2.5 mg by nebulization every 4 (four) hours as needed for wheezing or shortness of breath.  01/24/17  Yes Magdalen Spatz, NP  arformoterol (BROVANA) 15 MCG/2ML NEBU Take 2 mLs (15 mcg total) by nebulization 2 (two) times daily. 09/07/17  Yes Juanito Doom, MD  benzonatate (TESSALON) 100 MG capsule Take 1 capsule (100 mg total) by mouth 2 (two) times daily as needed for cough. 08/30/17  Yes Nafziger, Tommi Rumps, NP  fentaNYL (DURAGESIC - DOSED MCG/HR) 50 MCG/HR Place 50 mcg onto the skin every 3 (three) days.   Yes [provider]  ferrous sulfate 325 (65 FE) MG tablet Take 325 mg by mouth daily with breakfast.   Yes [provider]  levothyroxine (SYNTHROID, LEVOTHROID) 125 MCG tablet Take 1 tablet (125 mcg total) by mouth daily. 08/16/17  Yes Nafziger, Tommi Rumps, NP  nitroGLYCERIN (NITROSTAT) 0.4 MG SL tablet Place 1 tablet (0.4 mg total) under the tongue every 5 (five)  minutes as needed for chest pain. 10/28/14  Yes Herminio Commons, MD  Omega-3 Fatty Acids (FISH OIL) 600 MG CAPS Take 1 capsule by mouth 3 (three) times daily.   Yes [provider]  OXYGEN Inhale 3 L into the lungs continuous.    Yes [provider]  ticagrelor (BRILINTA) 60 MG TABS tablet TAKE 1 TABLET(60 MG) BY MOUTH TWICE DAILY Patient taking differently: Take 60 mg by mouth 2 (two) times daily.  10/05/17  Yes Herminio Commons, MD  warfarin (COUMADIN) 1 MG tablet Take 1 tablet (1mg ) on Sunday, Tuesday, Thursday and Saturday along with a 5mg  tablet or as directed 11/07/17  Yes Herminio Commons, MD  warfarin (COUMADIN) 5 MG tablet Take 5 mg by mouth daily.   Yes [provider]  budesonide (PULMICORT) 0.5 MG/2ML nebulizer solution Take 2 mLs (0.5 mg total)  by nebulization 2 (two) times daily. Dx: J43.9 01/24/17   Magdalen Spatz, NP  ipratropium-albuterol (DUONEB) 0.5-2.5 (3) MG/3ML SOLN Take 3 mLs by nebulization 4 (four) times daily. 03/20/17   Magdalen Spatz, NP  Respiratory Therapy Supplies (FLUTTER) DEVI Use as directed 05/14/15   Javier Glazier, MD  traZODone (DESYREL) 50 MG tablet Take 50 mg by mouth at bedtime as needed for sleep.    [provider]    Physical Exam: Vitals:   01/19/18 2330 01/20/18 0104 01/20/18 0415 01/20/18 1003  BP: (!) 122/52 117/63 134/63 (!) 174/61  Pulse: 95 96 86 94  Resp: 18 20 17 20   Temp:  (!) 97.3 F (36.3 C)    TempSrc:  Oral  Oral  SpO2: 96% 90% 99% 95%  Weight:      Height:        Constitutional: NAD, calm, comfortable Vitals:   01/19/18 2330 01/20/18 0104 01/20/18 0415 01/20/18 1003  BP: (!) 122/52 117/63 134/63 (!) 174/61  Pulse: 95 96 86 94  Resp: 18 20 17 20   Temp:  (!) 97.3 F (36.3 C)    TempSrc:  Oral  Oral  SpO2: 96% 90% 99% 95%  Weight:      Height:       Eyes: PERRL, lids and conjunctivae normal ENMT: Mucous membranes are moist. Posterior pharynx clear of any exudate or  lesions.Normal dentition.  Neck: normal, supple, no masses, no thyromegaly Respiratory: Bilateral expiratory wheezes.  She has conducted airway sounds and coarse crepitations especially on expiration.  She is on 5 L of oxygen.  Mild shortness of breath on talking.  She can finish sentences. Cardiovascular: Regular rate and rhythm, no murmurs / rubs / gallops. No extremity edema. 2+ pedal pulses. No carotid bruits.  Abdomen: no tenderness, no masses palpated. No hepatosplenomegaly. Bowel sounds positive.  Musculoskeletal: no clubbing / cyanosis. No joint deformity upper and lower extremities. Good ROM, no contractures. Normal muscle tone.  Skin: no rashes, lesions, ulcers. No induration Neurologic: CN 2-12 grossly intact. Sensation intact, DTR normal. Strength 5/5 in all 4.  Psychiatric: Normal judgment and insight. Alert and oriented x 3. Normal mood.  Flat affect.    Labs on Admission: I have personally reviewed following labs and imaging studies  CBC: Recent Labs  Lab 01/19/18 2121  WBC 9.3  HGB 11.1*  HCT 38.6  MCV 100.3*  PLT 573   Basic Metabolic Panel: Recent Labs  Lab 01/19/18 2121  NA 140  K 3.8  CL 96*  CO2 37*  GLUCOSE 160*  BUN 21  CREATININE 0.89  CALCIUM 8.8*   GFR: Estimated Creatinine Clearance: 58.1 mL/min (by C-G formula based on SCr of 0.89 mg/dL). Liver Function Tests: No results for input(s): AST, ALT, ALKPHOS, BILITOT, PROT, ALBUMIN in the last 168 hours. No results for input(s): LIPASE, AMYLASE in the last 168 hours. No results for input(s): AMMONIA in the last 168 hours. Coagulation Profile: Recent Labs  Lab 01/19/18 2121  INR 2.67   Cardiac Enzymes: Recent Labs  Lab 01/19/18 2121  TROPONINI <0.03   BNP (last 3 results) No results for input(s): PROBNP in the last 8760 hours. HbA1C: No results for input(s): HGBA1C in the last 72 hours. CBG: No results for input(s): GLUCAP in the last 168 hours. Lipid Profile: No results for input(s):  CHOL, HDL, LDLCALC, TRIG, CHOLHDL, LDLDIRECT in the last 72 hours. Thyroid Function Tests: No results for input(s): TSH, T4TOTAL, FREET4, T3FREE, THYROIDAB in the last  72 hours. Anemia Panel: No results for input(s): VITAMINB12, FOLATE, FERRITIN, TIBC, IRON, RETICCTPCT in the last 72 hours. Urine analysis:    Component Value Date/Time   COLORURINE YELLOW 01/19/2018 2130   APPEARANCEUR HAZY (A) 01/19/2018 2130   LABSPEC 1.019 01/19/2018 2130   PHURINE 6.0 01/19/2018 2130   GLUCOSEU NEGATIVE 01/19/2018 2130   HGBUR LARGE (A) 01/19/2018 2130   BILIRUBINUR NEGATIVE 01/19/2018 2130   BILIRUBINUR n 09/21/2016 1432   KETONESUR NEGATIVE 01/19/2018 2130   PROTEINUR 100 (A) 01/19/2018 2130   UROBILINOGEN 0.2 09/21/2016 1432   UROBILINOGEN 0.2 04/13/2009 2025   NITRITE NEGATIVE 01/19/2018 2130   LEUKOCYTESUR NEGATIVE 01/19/2018 2130    Radiological Exams on Admission: Dg Lumbar Spine Complete  Result Date: 01/19/2018 CLINICAL DATA:  [place] tonight with pop in the low back. Low back pain. EXAM: LUMBAR SPINE - COMPLETE 4+ VIEW COMPARISON:  None. FINDINGS: Examination is technically limited due to multiple superimposed leads and positioning. Normal alignment of the lumbar spine. Normal alignment of the lumbar vertebrae. No vertebral compression deformities. No focal bone lesion or bone destruction. Mild disc space narrowing and endplate hypertrophic changes consistent with mild degenerative change. Visualized sacrum appears intact. IMPRESSION: Mild degenerative changes in the lumbar spine. Normal alignment. No acute displaced fractures identified. Electronically Signed   By: Lucienne Capers M.D.   On: 01/19/2018 22:26   Dg Pelvis 1-2 Views  Result Date: 01/19/2018 CLINICAL DATA:  Patient fell tonight, feeling a pop in the low back. Now low back pain. EXAM: PELVIS - 1-2 VIEW COMPARISON:  None. FINDINGS: There is no evidence of pelvic fracture or diastasis. No pelvic bone lesions are seen. IMPRESSION:  Negative. Electronically Signed   By: Lucienne Capers M.D.   On: 01/19/2018 22:24   Mr Lumbar Spine Wo Contrast  Result Date: 01/20/2018 CLINICAL DATA:  Fall yesterday.  Pain. EXAM: MRI LUMBAR SPINE WITHOUT CONTRAST TECHNIQUE: Multiplanar, multisequence MR imaging of the lumbar spine was performed. No intravenous contrast was administered. COMPARISON:  Lumbar spine radiographs 01/19/2018 FINDINGS: Segmentation: 5 non rib-bearing lumbar type vertebral bodies are present. The lowest fully formed vertebral body is L5. Alignment:  AP alignment is anatomic. Vertebrae: Remote superior endplate fractures are present at L1 and L5. A superior endplate fracture of L3 on the right demonstrates marrow signal change battle with acute/subacute fracture. There is 30% loss of height anteriorly. Stir sequences demonstrate edema extending into the right pedicle. There is no retropulsed bone. Conus medullaris and cauda equina: Conus extends to the L1-2 level. Conus and cauda equina appear normal. Paraspinal and other soft tissues: Limited imaging the abdomen is unremarkable. There is no significant adenopathy. No solid organ lesions are present. Disc levels: L1-2: Negative. L2-3: Mild disc bulging is present without significant stenosis. L3-4: Mild disc bulging is present. There is no significant stenosis. L4-5: A broad-based disc protrusion is present. Mild facet hypertrophy is present bilaterally. Mild subarticular and foraminal narrowing is present bilaterally. L5-S1: A shallow central disc protrusion is present. The central canal is patent. Foramina are patent bilaterally. IMPRESSION: 1. Acute/subacute superior endplate fracture of L3 on the right with approximately 30% loss of height. No retropulsed bone or associated stenosis. 2. Remote superior endplate fractures at L1 and L5. 3. Broad-based disc protrusion at L4-5 with mild subarticular and foraminal stenosis bilaterally. Electronically Signed   By: San Morelle  M.D.   On: 01/20/2018 06:51    EKG: Independently reviewed.  Normal sinus rhythm.  She has some right bundle branch  block patterns.  EKG is comparable to previous EKGs from 02/20/2017.  Assessment/Plan Principal Problem:   COPD exacerbation (HCC) Active Problems:   Hypothyroidism   TOBACCO ABUSE   History of pulmonary embolus (PE)   COPD (chronic obstructive pulmonary disease) with emphysema (HCC)   Factor V Leiden, prothrombin gene mutation (HCC)   Hyperlipidemia   Chronic respiratory failure (HCC)   Vertebral fracture, osteoporotic (HCC)   COPD with exacerbation: With underlying history of chronic hypoxic respiratory failure and ongoing smoking. Admit for monitoring and treatment. Will treat with iv steroids, aggressive bronchodilator, inhalation steroids,oxygen therapy. She may at baseline need more than prescribed oxygen therapy.   Traumatic and osteoporotic L3 fracture: Neurologically stable.  Seen by spinal surgery.  Pain management and ambulation with TLSO brace. She is on fentanyl patch, continue .Add local lidocaine, tylenol PT/OT consult. She is not agreeable to SNF placement.   Factor V Leiden with history of thromboembolism on chronic Coumadin therapy: She is compliant and therapeutic on Coumadin.  Pharmacy to dose Coumadin.  Hypothyroidism: On Synthroid.  Clinically euthyroid.  Continue.  Smoker: Counseled to quit.  Less likely to quit.  On nicotine patch.   social: she will need evaluation for support at home and living situation. To be seen by Education officer, museum.   DVT prophylaxis: On Coumadin. Code Status: Full code. Family Communication: No family at the bedside. Disposition Plan: Home with home care versus skilled nursing. Consults called: Spinal surgery. Admission status: Observation.   Barb Merino MD [facility] Pager [phone]  If 7PM-7AM, please contact night-coverage [email or web address] Password [email or web address]  01/20/2018, 1:12 PM

## 2018-01-20 NOTE — ED Provider Notes (Signed)
I assumed care of this patient from Dr. Leonides Schanz at 7 am.  Please see their note for further details of Hx, PE.  Briefly patient is a 68 y.o. female who presented with fall, back pain, transfer from OSH, found to have L3 compression fx, awaiting TLSO brace and ambulation, pain has been tolerated while patient in still, will eval after brace to see if pain is controlled and if it is can be d/c to home with outpatient spine f/u.   Patient upon my reevaluation appears to have continued pain.  She has been fitted with her TLSO brace.  However, she has coarse breath sounds on exam.  Upon further discussion she has been using her home nebulizer more frequently.  She has been using increasing amounts of oxygen meant from normal.  She appears to likely have a COPD exacerbation.  Patient given duo nebs.  Will give IV Solu-Medrol.  Will obtain chest x-ray to evaluate for pneumonia.  Patient with difficulty trying to ambulate due to pain as well as respiratory symptoms.  Suspect that possibly fall was due to respiratory process as well.  Neurosurgery was consulted to make them aware that patient is being admitted with compression fracture and will evaluated patient.  Patient to be admitted to medicine service for further respiratory care/COPD exacerbation.  This chart was dictated using voice recognition software.  Despite best efforts to proofread,  errors can occur which can change the documentation meaning.      Lennice Sites, DO 01/20/18 1205

## 2018-01-21 DIAGNOSIS — Z87891 Personal history of nicotine dependence: Secondary | ICD-10-CM | POA: Diagnosis not present

## 2018-01-21 DIAGNOSIS — S32039A Unspecified fracture of third lumbar vertebra, initial encounter for closed fracture: Secondary | ICD-10-CM | POA: Diagnosis present

## 2018-01-21 DIAGNOSIS — R0902 Hypoxemia: Secondary | ICD-10-CM | POA: Diagnosis not present

## 2018-01-21 DIAGNOSIS — R2689 Other abnormalities of gait and mobility: Secondary | ICD-10-CM | POA: Diagnosis not present

## 2018-01-21 DIAGNOSIS — J9611 Chronic respiratory failure with hypoxia: Secondary | ICD-10-CM

## 2018-01-21 DIAGNOSIS — Z955 Presence of coronary angioplasty implant and graft: Secondary | ICD-10-CM | POA: Diagnosis not present

## 2018-01-21 DIAGNOSIS — S32039D Unspecified fracture of third lumbar vertebra, subsequent encounter for fracture with routine healing: Secondary | ICD-10-CM | POA: Diagnosis not present

## 2018-01-21 DIAGNOSIS — W19XXXA Unspecified fall, initial encounter: Secondary | ICD-10-CM

## 2018-01-21 DIAGNOSIS — Z79890 Hormone replacement therapy (postmenopausal): Secondary | ICD-10-CM | POA: Diagnosis not present

## 2018-01-21 DIAGNOSIS — I2510 Atherosclerotic heart disease of native coronary artery without angina pectoris: Secondary | ICD-10-CM | POA: Diagnosis present

## 2018-01-21 DIAGNOSIS — Z9181 History of falling: Secondary | ICD-10-CM | POA: Diagnosis not present

## 2018-01-21 DIAGNOSIS — D6852 Prothrombin gene mutation: Secondary | ICD-10-CM | POA: Diagnosis present

## 2018-01-21 DIAGNOSIS — Y92009 Unspecified place in unspecified non-institutional (private) residence as the place of occurrence of the external cause: Secondary | ICD-10-CM | POA: Diagnosis not present

## 2018-01-21 DIAGNOSIS — J441 Chronic obstructive pulmonary disease with (acute) exacerbation: Secondary | ICD-10-CM | POA: Diagnosis present

## 2018-01-21 DIAGNOSIS — Z720 Tobacco use: Secondary | ICD-10-CM | POA: Diagnosis not present

## 2018-01-21 DIAGNOSIS — D6851 Activated protein C resistance: Secondary | ICD-10-CM | POA: Diagnosis not present

## 2018-01-21 DIAGNOSIS — R0602 Shortness of breath: Secondary | ICD-10-CM | POA: Diagnosis not present

## 2018-01-21 DIAGNOSIS — T380X5A Adverse effect of glucocorticoids and synthetic analogues, initial encounter: Secondary | ICD-10-CM | POA: Diagnosis present

## 2018-01-21 DIAGNOSIS — I2699 Other pulmonary embolism without acute cor pulmonale: Secondary | ICD-10-CM | POA: Diagnosis not present

## 2018-01-21 DIAGNOSIS — Z9861 Coronary angioplasty status: Secondary | ICD-10-CM | POA: Diagnosis not present

## 2018-01-21 DIAGNOSIS — M2550 Pain in unspecified joint: Secondary | ICD-10-CM | POA: Diagnosis not present

## 2018-01-21 DIAGNOSIS — Z86718 Personal history of other venous thrombosis and embolism: Secondary | ICD-10-CM | POA: Diagnosis not present

## 2018-01-21 DIAGNOSIS — G8929 Other chronic pain: Secondary | ICD-10-CM | POA: Diagnosis not present

## 2018-01-21 DIAGNOSIS — M545 Low back pain: Secondary | ICD-10-CM | POA: Diagnosis not present

## 2018-01-21 DIAGNOSIS — Z79899 Other long term (current) drug therapy: Secondary | ICD-10-CM | POA: Diagnosis not present

## 2018-01-21 DIAGNOSIS — R739 Hyperglycemia, unspecified: Secondary | ICD-10-CM | POA: Diagnosis present

## 2018-01-21 DIAGNOSIS — Z86711 Personal history of pulmonary embolism: Secondary | ICD-10-CM | POA: Diagnosis not present

## 2018-01-21 DIAGNOSIS — Z7901 Long term (current) use of anticoagulants: Secondary | ICD-10-CM | POA: Diagnosis not present

## 2018-01-21 DIAGNOSIS — F17210 Nicotine dependence, cigarettes, uncomplicated: Secondary | ICD-10-CM | POA: Diagnosis present

## 2018-01-21 DIAGNOSIS — E039 Hypothyroidism, unspecified: Secondary | ICD-10-CM | POA: Diagnosis present

## 2018-01-21 DIAGNOSIS — Z79891 Long term (current) use of opiate analgesic: Secondary | ICD-10-CM | POA: Diagnosis not present

## 2018-01-21 DIAGNOSIS — I252 Old myocardial infarction: Secondary | ICD-10-CM | POA: Diagnosis not present

## 2018-01-21 DIAGNOSIS — W1830XA Fall on same level, unspecified, initial encounter: Secondary | ICD-10-CM | POA: Diagnosis present

## 2018-01-21 DIAGNOSIS — M8008XD Age-related osteoporosis with current pathological fracture, vertebra(e), subsequent encounter for fracture with routine healing: Secondary | ICD-10-CM | POA: Diagnosis not present

## 2018-01-21 DIAGNOSIS — Y92019 Unspecified place in single-family (private) house as the place of occurrence of the external cause: Secondary | ICD-10-CM | POA: Diagnosis not present

## 2018-01-21 DIAGNOSIS — M6281 Muscle weakness (generalized): Secondary | ICD-10-CM | POA: Diagnosis not present

## 2018-01-21 DIAGNOSIS — E785 Hyperlipidemia, unspecified: Secondary | ICD-10-CM | POA: Diagnosis present

## 2018-01-21 DIAGNOSIS — M8008XA Age-related osteoporosis with current pathological fracture, vertebra(e), initial encounter for fracture: Secondary | ICD-10-CM | POA: Diagnosis present

## 2018-01-21 DIAGNOSIS — Z7951 Long term (current) use of inhaled steroids: Secondary | ICD-10-CM | POA: Diagnosis not present

## 2018-01-21 DIAGNOSIS — Z7401 Bed confinement status: Secondary | ICD-10-CM | POA: Diagnosis not present

## 2018-01-21 DIAGNOSIS — Z9981 Dependence on supplemental oxygen: Secondary | ICD-10-CM | POA: Diagnosis not present

## 2018-01-21 LAB — PROTIME-INR
INR: 3.72
Prothrombin Time: 36.3 seconds — ABNORMAL HIGH (ref 11.4–15.2)

## 2018-01-21 LAB — CBC
HCT: 33 % — ABNORMAL LOW (ref 36.0–46.0)
Hemoglobin: 9.7 g/dL — ABNORMAL LOW (ref 12.0–15.0)
MCH: 28.4 pg (ref 26.0–34.0)
MCHC: 29.4 g/dL — ABNORMAL LOW (ref 30.0–36.0)
MCV: 96.5 fL (ref 80.0–100.0)
Platelets: 168 10*3/uL (ref 150–400)
RBC: 3.42 MIL/uL — ABNORMAL LOW (ref 3.87–5.11)
RDW: 15.3 % (ref 11.5–15.5)
WBC: 7.5 10*3/uL (ref 4.0–10.5)
nRBC: 0 % (ref 0.0–0.2)

## 2018-01-21 LAB — BASIC METABOLIC PANEL
Anion gap: 5 (ref 5–15)
BUN: 18 mg/dL (ref 8–23)
CO2: 36 mmol/L — ABNORMAL HIGH (ref 22–32)
Calcium: 8.4 mg/dL — ABNORMAL LOW (ref 8.9–10.3)
Chloride: 97 mmol/L — ABNORMAL LOW (ref 98–111)
Creatinine, Ser: 0.86 mg/dL (ref 0.44–1.00)
GFR calc Af Amer: >60 mL/min (ref >60)
GFR calc non Af Amer: >60 mL/min (ref >60)
GLUCOSE: 153 mg/dL — AB (ref 70–99)
Potassium: 4.1 mmol/L (ref 3.5–5.1)
Sodium: 138 mmol/L (ref 135–145)

## 2018-01-21 LAB — GLUCOSE, CAPILLARY
Glucose-Capillary: 113 mg/dL — ABNORMAL HIGH (ref 70–99)
Glucose-Capillary: 134 mg/dL — ABNORMAL HIGH (ref 70–99)
Glucose-Capillary: 132 mg/dL — ABNORMAL HIGH (ref 70–99)

## 2018-01-21 LAB — HIV ANTIBODY (ROUTINE TESTING W REFLEX): HIV SCREEN 4TH GENERATION: NONREACTIVE

## 2018-01-21 MED ORDER — METHYLPREDNISOLONE SODIUM SUCC 40 MG IJ SOLR
40.0000 mg | Freq: Two times a day (BID) | INTRAMUSCULAR | Status: DC
  Administered 2018-01-21 – 2018-01-24 (×5): 40 mg via INTRAVENOUS
  Filled 2018-01-21 (×5): qty 1

## 2018-01-21 MED ORDER — INSULIN ASPART 100 UNIT/ML ~~LOC~~ SOLN
0.0000 [IU] | Freq: Three times a day (TID) | SUBCUTANEOUS | Status: DC
  Administered 2018-01-21 – 2018-01-22 (×4): 2 [IU] via SUBCUTANEOUS
  Administered 2018-01-23: 3 [IU] via SUBCUTANEOUS
  Administered 2018-01-23: 2 [IU] via SUBCUTANEOUS

## 2018-01-21 NOTE — Evaluation (Signed)
Physical Therapy Evaluation Patient Details Name: [name] MRN: [id number] DOB: 03-Dec-1950 Today's Date: 01/21/2018   History of Present Illness  68 y.o. female with medical history significant of COPD, chronic hypoxia on 2 to 3 L of oxygen, ongoing smoker, coronary artery disease, factor V Leyden and history of pulmonary embolism on Coumadin.  She presented to the emergency room after a fall at home.  MRI revealed L3 compression fx.     Clinical Impression  Pt admitted with above diagnosis. Pt currently with functional limitations due to the deficits listed below (see PT Problem List). PTA pt lived at home independent, utilizing cane to ambulate. On eval, she required mod assist bed mobility, mod assist sit to stand, and min assist ambulation 3 feet with RW. Mobility limited by pain. Pt educated on 3/3 back precautions and TLSO wear schedule. Pt was wearing TLSO in bed on arrival. Advised TLSO can be donned/doffed EOB and worn when OOB.  Pt will benefit from skilled PT to increase their independence and safety with mobility to allow discharge to the venue listed below.       Follow Up Recommendations SNF    Equipment Recommendations  None recommended by PT    Recommendations for Other Services       Precautions / Restrictions Precautions Precautions: Fall;Back Precaution Comments: Pt educated on 3/3 back precautions. Required Braces or Orthoses: Spinal Brace Spinal Brace: Thoracolumbosacral orthotic;Applied in sitting position      Mobility  Bed Mobility Overal bed mobility: Needs Assistance Bed Mobility: Rolling;Sidelying to Sit Rolling: Mod assist Sidelying to sit: Mod assist;HOB elevated       General bed mobility comments: +rail, cues for sequencing/logroll, assist with BLE off bed and to elevate trunk  Transfers Overall transfer level: Needs assistance Equipment used: Rolling walker (2 wheeled) Transfers: Sit to/from Stand Sit to Stand: Mod assist          General transfer comment: cues for hand placement and sequencing assist to power up and to control descent  Ambulation/Gait Ambulation/Gait assistance: Min assist Gait Distance (Feet): 3 Feet Assistive device: Rolling walker (2 wheeled) Gait Pattern/deviations: Step-to pattern;Decreased stride length;Antalgic     General Gait Details: short, pivot steps with RW bed to recliner, cues for sequencing, assist to progress RW and maintain balance  Stairs            Wheelchair Mobility    Modified Rankin (Stroke Patients Only)       Balance Overall balance assessment: Needs assistance Sitting-balance support: Feet supported;Single extremity supported Sitting balance-Leahy Scale: Fair     Standing balance support: Bilateral upper extremity supported;During functional activity Standing balance-Leahy Scale: Poor Standing balance comment: reliant on RW and external support                             Pertinent Vitals/Pain Pain Assessment: 0-10 Pain Score: 10-Worst pain ever Pain Location: back with mobility Pain Descriptors / Indicators: Grimacing;Guarding;Moaning Pain Intervention(s): Monitored during session;Repositioned;Patient requesting pain meds-RN notified    Home Living Family/patient expects to be discharged to:: Private residence Living Arrangements: Alone Available Help at Discharge: Family;Available PRN/intermittently Type of Home: House Home Access: Stairs to enter Entrance Stairs-Rails: Right;Left;Can reach both Entrance Stairs-Number of Steps: 4 Home Layout: One level Home Equipment: Walker - 2 wheels;Cane - single point;Shower seat;Bedside commode Additional Comments: continuous O2 at home, 3 L    Prior Function Level of Independence: Independent with assistive device(s)  Comments: household ambulation without assistive device, uses [facility]PC for community: still drives     Hand Dominance        Extremity/Trunk Assessment    Upper Extremity Assessment Upper Extremity Assessment: Defer to OT evaluation    Lower Extremity Assessment Lower Extremity Assessment: RLE deficits/detail;LLE deficits/detail;Generalized weakness RLE: Unable to fully assess due to pain LLE: Unable to fully assess due to pain    Cervical / Trunk Assessment Cervical / Trunk Assessment: Other exceptions Cervical / Trunk Exceptions: L3 comp fx in TLSO  Communication   Communication: No difficulties  Cognition Arousal/Alertness: Awake/alert Behavior During Therapy: WFL for tasks assessed/performed Overall Cognitive Status: Within Functional Limits for tasks assessed                                        General Comments General comments (skin integrity, edema, etc.): Pt on 3 L O2 during session.    Exercises     Assessment/Plan    PT Assessment Patient needs continued PT services  PT Problem List Decreased strength;Decreased balance;Decreased knowledge of precautions;Pain;Decreased mobility;Decreased knowledge of use of DME;Decreased activity tolerance;Cardiopulmonary status limiting activity       PT Treatment Interventions DME instruction;Functional mobility training;Balance training;Patient/family education;Gait training;Therapeutic activities;Therapeutic exercise;Stair training    PT Goals (Current goals can be found in the Care Plan section)  Acute Rehab PT Goals Patient Stated Goal: decrease pain PT Goal Formulation: With patient Time For Goal Achievement: 01/28/18 Potential to Achieve Goals: Good    Frequency Min 5X/week   Barriers to discharge Decreased caregiver support      Co-evaluation               AM-PAC PT "6 Clicks" Mobility  Outcome Measure Help needed turning from your back to your side while in a flat bed without using bedrails?: A Little Help needed moving from lying on your back to sitting on the side of a flat bed without using bedrails?: A Lot Help needed moving to and  from a bed to a chair (including a wheelchair)?: A Lot Help needed standing up from a chair using your arms (e.g., wheelchair or bedside chair)?: A Little Help needed to walk in hospital room?: A Lot Help needed climbing 3-5 steps with a railing? : Total 6 Click Score: 13    End of Session Equipment Utilized During Treatment: Gait belt;Back brace;Oxygen Activity Tolerance: Patient limited by pain Patient left: in chair;with call bell/phone within reach Nurse Communication: Mobility status PT Visit Diagnosis: Other abnormalities of gait and mobility (R26.89);Difficulty in walking, not elsewhere classified (R26.2);Pain;History of falling (Z91.81)    Time: 9371-6967 PT Time Calculation (min) (ACUTE ONLY): 21 min   Charges:   PT Evaluation $PT Eval Moderate Complexity: 1 Mod          Lorrin Goodell, PT  Office # [phone] Pager [phone]   Lorriane Shire 01/21/2018, 10:18 AM

## 2018-01-21 NOTE — Progress Notes (Signed)
ANTICOAGULATION CONSULT NOTE - Initial Consult  Pharmacy Consult for Coumadin Indication: factor V leiden  def, h/o PE  Allergies  Allergen Reactions  . Dilaudid [Hydromorphone Hcl] Hives and Nausea Only  . Minocycline Hcl     REACTION: Dizzy  . Prednisone     REACTION: feels like throat swelling, hallucinations  . Varenicline Tartrate     REACTION: Dizzy(chantix)   . Zocor [Simvastatin - High Dose] Other (See Comments)    myalgia    Patient Measurements: Height: 5\' 4"  (162.6 cm) Weight: 160 lb 7.9 oz (72.8 kg) IBW/kg (Calculated) : 54.7  Vital Signs: Temp: 97.7 F (36.5 C) (01/13 0730) Temp Source: Oral (01/13 0730) BP: 115/48 (01/13 0730) Pulse Rate: 74 (01/13 0730)  Labs: Recent Labs    01/19/18 2121 01/21/18 0234  HGB 11.1* 9.7*  HCT 38.6 33.0*  PLT 211 168  LABPROT 28.0* 36.3*  INR 2.67 3.72  CREATININE 0.89 0.86  TROPONINI <0.03  --     Estimated Creatinine Clearance: 62 mL/min (by C-G formula based on SCr of 0.86 mg/dL).   Medical History: Past Medical History:  Diagnosis Date  . Acute MI ([place])    x4, code blue x3  . Arteriosclerotic cardiovascular disease (ASCVD) 2002   Inf STEMI-2002. 2003-cutting balloon + brachytherapy for restenosis; subsequent acute stent thrombosis 06/2010 requiring 2 separate interventions (Zeta stent, then repeat cath with thrombectomy). focal basal inf AK, nl EF; 03/2011: Patent stents, minor nonobst  residual dz, nl EF; neg stress nuclear in 2008 and stress echo in 2009  . Chronic anticoagulation    Warfarin plus ticagrelor  . Chronic respiratory failure ([place]) 08/27/2013   On 2L 02  . COPD (chronic obstructive pulmonary disease) ([place])    02 dependent  . COPD (chronic obstructive pulmonary disease) ([place])   . Factor 5 Leiden mutation, heterozygous ([place])   . Factor V Leiden, prothrombin gene mutation ([place]) 2006  . Hyperlipidemia   . Hypothyroidism   . Noncompliance   . Pelvic fracture ([place]) 2009  . Pulmonary embolism  ([place]) 2006   Associated with deep vein thrombosis-2006; + factor V Leiden  . Tobacco abuse    50 pack years   Assessment: CC/HPI: Fall with unrelenting pain. BEM:[id number] degenerative changes with acute L3 compression fracture. Also COPD exac.  On Coumadin 6mg  daily exc 5mg  on MWF PTA for factor V leiden  def, h/o PE. INR up to 3.72. Hgb down to 9.7, plts wnl.  Goal of Therapy:  INR 2-3 Monitor platelets by anticoagulation protocol: Yes   Plan:  Hold Coumadin tonight Monitor daily INR, CBC, s/s of bleed  Elenor Quinones, PharmD, BCPS, [facility] Clinical Pharmacist Phone number [phone] 01/21/2018 8:13 AM

## 2018-01-21 NOTE — Evaluation (Addendum)
Occupational Therapy Evaluation Patient Details Name: [name] MRN: [id number] DOB: 22-Mar-1950 Today's Date: 01/21/2018    History of Present Illness 68 y.o. female with medical history significant of COPD, chronic hypoxia on 2 to 3 L of oxygen, ongoing smoker, coronary artery disease, factor V Leyden and history of pulmonary embolism on Coumadin.  She presented to the emergency room after a fall at home.  MRI revealed L3 compression fx.    Clinical Impression   This 68 y/o female presents with the above. At baseline pt reports she is mod independent with ADL and functional mobility using SPC for community distances. Pt presenting with increased pain, generalized weakness and decreased activity tolerance. Pt requiring minA for short distance functional mobility in room using RW. She currently requires minA for UB ADL, modA for LB ADL. Pt with questionable cognitive deficits during session as pt noted to have some confusion regarding recent events and requiring redirection to tasks during session. Pt reports she has a "housemate" and only with intermittent assist available at time of discharge. Given pt's current assist level feel she will require 24hr hands on assist at time of discharge as she is at a higher risk for falls. She will benefit from continued acute OT services and recommend follow up therapy services in SNF setting after discharge to maximize her safety and independence with ADL and mobility. Will follow.     Follow Up Recommendations  SNF;Supervision/Assistance - 24 hour    Equipment Recommendations  None recommended by OT(pt's DME needs are met)           Precautions / Restrictions Precautions Precautions: Fall;Back Precaution Booklet Issued: Yes (comment) Precaution Comments: Pt educated on 3/3 back precautions. Required Braces or Orthoses: Spinal Brace Spinal Brace: Thoracolumbosacral orthotic Restrictions Weight Bearing Restrictions: No      Mobility Bed  Mobility Overal bed mobility: Needs Assistance Bed Mobility: Rolling;Sit to Sidelying Rolling: Mod assist       Sit to sidelying: Mod assist General bed mobility comments: pt requires max cues for log roll technique, assist with bil LEs onto EOB and to initiate leaning onto elbow when transitioning into sidelying  Transfers Overall transfer level: Needs assistance Equipment used: Rolling walker (2 wheeled) Transfers: Sit to/from Stand Sit to Stand: Min assist         General transfer comment: cues for hand placement and sequencing assist to power up and to control descent    Balance Overall balance assessment: Needs assistance Sitting-balance support: Feet supported;Single extremity supported Sitting balance-Leahy Scale: Fair     Standing balance support: Bilateral upper extremity supported;During functional activity Standing balance-Leahy Scale: Poor Standing balance comment: reliant on RW and external support                           ADL either performed or assessed with clinical judgement   ADL Overall ADL's : Needs assistance/impaired Eating/Feeding: Modified independent;Sitting   Grooming: Set up;Sitting   Upper Body Bathing: Min guard;Sitting   Lower Body Bathing: Moderate assistance;Sit to/from stand   Upper Body Dressing : Minimal assistance;Sitting Upper Body Dressing Details (indicate cue type and reason): for TLSO management, educated RN in management as well  Lower Body Dressing: Moderate assistance;Sit to/from stand Lower Body Dressing Details (indicate cue type and reason): due to maintaining back precautions; minA standing balance Toilet Transfer: Minimal assistance;Ambulation;RW;BSC Toilet Transfer Details (indicate cue type and reason): BSC in room; pt ambulatory short distance in room using RW  with minA Toileting- Clothing Manipulation and Hygiene: Minimal assistance;Sit to/from stand       Functional mobility during ADLs: Minimal  assistance;Rolling walker General ADL Comments: pt limited due to pain, generalized weakness and decreased activity tolerance. pt ambulating few steps forwards/back from recliner (x3 sets) when standing with minA     Vision         Perception     Praxis      Pertinent Vitals/Pain Pain Assessment: Faces Faces Pain Scale: Hurts even more Pain Location: back with mobility Pain Descriptors / Indicators: Grimacing;Guarding;Moaning Pain Intervention(s): Limited activity within patient's tolerance;Monitored during session;Premedicated before session;Repositioned     Hand Dominance     Extremity/Trunk Assessment Upper Extremity Assessment Upper Extremity Assessment: Generalized weakness   Lower Extremity Assessment Lower Extremity Assessment: Defer to PT evaluation   Cervical / Trunk Assessment Cervical / Trunk Assessment: Other exceptions Cervical / Trunk Exceptions: L3 comp fx in TLSO   Communication Communication Communication: No difficulties   Cognition Arousal/Alertness: Awake/alert Behavior During Therapy: WFL for tasks assessed/performed Overall Cognitive Status: No family/caregiver present to determine baseline cognitive functioning                                 General Comments: pt with tangential thoughts when asking about PLOF, needs redirection during session, pt reports her phone and TV were hacked by [employer] during this time   General Comments       Exercises     Shoulder Instructions      Home Living Family/patient expects to be discharged to:: Private residence Living Arrangements: Alone Available Help at Discharge: Family;Available PRN/intermittently Type of Home: House Home Access: Stairs to enter [employer] of Steps: 4 Entrance Stairs-Rails: Right;Left;Can reach both Home Layout: One level     Bathroom Shower/Tub: Teacher, early years/pre: Standard Bathroom Accessibility: Yes   Home Equipment: Environmental consultant - 2  wheels;Cane - single point;Shower seat;Bedside commode   Additional Comments: continuous O2 at home, 3 L      Prior Functioning/Environment Level of Independence: Independent with assistive device(s)        Comments: household ambulation without assistive device, uses SPC for community: still drives; reports she has most recently been sponge bathing due to safety concerns with getting into/out of tub        OT Problem List: Decreased strength;Decreased range of motion;Decreased activity tolerance;Impaired balance (sitting and/or standing);Decreased knowledge of use of DME or AE;Decreased knowledge of precautions;Pain;Decreased cognition      OT Treatment/Interventions: Self-care/ADL training;Therapeutic exercise;Neuromuscular education;DME and/or AE instruction;Therapeutic activities;Patient/family education;Balance training;Cognitive remediation/compensation    OT Goals(Current goals can be found in the care plan section) Acute Rehab OT Goals Patient Stated Goal: decrease pain OT Goal Formulation: With patient Time For Goal Achievement: 02/04/18 Potential to Achieve Goals: Good  OT Frequency: Min 2X/week   Barriers to D/C: Decreased caregiver support  pt only with intermittent assist at home       Co-evaluation              AM-PAC OT "6 Clicks" Daily Activity     Outcome Measure Help from another person eating meals?: None Help from another person taking care of personal grooming?: A Little Help from another person toileting, which includes using toliet, bedpan, or urinal?: A Lot Help from another person bathing (including washing, rinsing, drying)?: A Lot Help from another person to put on and taking off regular upper  body clothing?: A Little Help from another person to put on and taking off regular lower body clothing?: A Lot 6 Click Score: 16   End of Session Equipment Utilized During Treatment: Gait belt;Rolling walker;Oxygen Nurse Communication: Mobility  status  Activity Tolerance: Patient tolerated treatment well;Patient limited by pain Patient left: in bed;with call bell/phone within reach;with nursing/sitter in room  OT Visit Diagnosis: Other abnormalities of gait and mobility (R26.89);History of falling (Z91.81);Pain Pain - part of body: (back)                Time: 1411-1441 OT Time Calculation (min): 30 min Charges:  OT General Charges $OT Visit: 1 Visit OT Evaluation $OT Eval Moderate Complexity: 1 Mod OT Treatments $Self Care/Home Management : 8-22 mins  Lou Cal, OT [facility] Pager [phone] Office [phone]   Raymondo Band 01/21/2018, 4:39 PM

## 2018-01-21 NOTE — Discharge Instructions (Addendum)

## 2018-01-21 NOTE — Progress Notes (Signed)
[facility] PROGRESS NOTE  [name] GEZ:[id number] DOB: [birth year] DOA: 01/19/2018 PCP: [name] Peng, NP  Assessment/Plan:  COPD with exacerbation: With underlying history of chronic hypoxic respiratory failure and ongoing smoking. Little improvement this am. respers restricted from pain. Oxygen saturation level 97-100 on 3 L [place].  -decrease oxygen to 2L -target oxygen saturation level range 89%-92% -continue scheduled nebs -continue solumedrol -add flutter valve -pain control -mobilize -incentive spirometry -monitor oxygen saturation levle   Traumatic and osteoporotic L3 fracture: Neurologically stable.  Seen by spinal surgery with no surgery required. Poor pain control this am. -resume fentanyl patch (reportedly patch removed by nursing) -continue lido patch and po analgesia for breakthrough -PT/OT consult. She is not agreeable to SNF placement.   Hyperglycemia: steroid induced most likely -SSI  -monitor CBG  Factor V Leiden with history of thromboembolism on chronic Coumadin therapy: She is compliant and therapeutic on Coumadin.  Pharmacy to dose Coumadin.  Hypothyroidism: On Synthroid.  Clinically euthyroid.  Continue.  Smoker: Counseled to quit.  Less likely to quit.  On nicotine patch.   social: she will need evaluation for support at home and living situation.  -appreciate SW input   Code Status: full Family Communication: none present Disposition Plan: to be determined. She is not amenable to snf.    Consultants:  Dr [name] neurosurgery  Procedures:    Antibiotics:    HPI/Subjective: [name] is a 68 year old female with hx MI x4 COPD on supplemental O2, factor V leiden on anticoag who presented to [facility] ED 1/11 after a fall. She was transferred to [facility] due to unrelenting pain for an MRI after Xray was read as normal.  MR revealed multilevel degenerative changes with acute L3 compression fracture so NSY consulted.  Continues with poor  pain control complicating the copd exacerbation. Painful for her to deep breathe or cough much less move   Objective: Vitals:   01/21/18 0722 01/21/18 0730  BP:  (!) 115/48  Pulse:  74  Resp:    Temp:  97.7 F (36.5 C)  SpO2: 97% 100%    Intake/Output Summary (Last 24 hours) at 01/21/2018 0955 Last data filed at 01/20/2018 1900 Gross per 24 hour  Intake 1240.28 ml  Output 200 ml  Net 1040.28 ml   Filed Weights   01/19/18 1935 01/21/18 0500  Weight: 68 kg 72.8 kg    Exam:   General:  Awake alert anxious in moderate amount of distress with movement  Cardiovascular: rrr no MGR no LE edema   Respiratory: normal effort, respers shallow audible wheeze and coarse BS throughout. No crackles  Abdomen: obese soft +BS no guarding or rebounding   Musculoskeletal:  TLSO brace intact joints without swelling/erythema   Data Reviewed: Basic Metabolic Panel: Recent Labs  Lab 01/19/18 2121 01/21/18 0234  NA 140 138  K 3.8 4.1  CL 96* 97*  CO2 37* 36*  GLUCOSE 160* 153*  BUN 21 18  CREATININE 0.89 0.86  CALCIUM 8.8* 8.4*   Liver Function Tests: No results for input(s): AST, ALT, ALKPHOS, BILITOT, PROT, ALBUMIN in the last 168 hours. No results for input(s): LIPASE, AMYLASE in the last 168 hours. No results for input(s): AMMONIA in the last 168 hours. CBC: Recent Labs  Lab 01/19/18 2121 01/21/18 0234  WBC 9.3 7.5  HGB 11.1* 9.7*  HCT 38.6 33.0*  MCV 100.3* 96.5  PLT 211 168   Cardiac Enzymes: Recent Labs  Lab 01/19/18 2121  TROPONINI <0.03   BNP (last  3 results) Recent Labs    02/19/17 1540  BNP 121.0*    ProBNP (last 3 results) No results for input(s): PROBNP in the last 8760 hours.  CBG: No results for input(s): GLUCAP in the last 168 hours.  No results found for this or any previous visit (from the past 240 hour(s)).   Studies: Dg Lumbar Spine Complete  Result Date: 01/19/2018 CLINICAL DATA:  [place] tonight with pop in the low back. Low back  pain. EXAM: LUMBAR SPINE - COMPLETE 4+ VIEW COMPARISON:  None. FINDINGS: Examination is technically limited due to multiple superimposed leads and positioning. Normal alignment of the lumbar spine. Normal alignment of the lumbar vertebrae. No vertebral compression deformities. No focal bone lesion or bone destruction. Mild disc space narrowing and endplate hypertrophic changes consistent with mild degenerative change. Visualized sacrum appears intact. IMPRESSION: Mild degenerative changes in the lumbar spine. Normal alignment. No acute displaced fractures identified. Electronically Signed   By: Lucienne Capers M.D.   On: 01/19/2018 22:26   Dg Pelvis 1-2 Views  Result Date: 01/19/2018 CLINICAL DATA:  Patient fell tonight, feeling a pop in the low back. Now low back pain. EXAM: PELVIS - 1-2 VIEW COMPARISON:  None. FINDINGS: There is no evidence of pelvic fracture or diastasis. No pelvic bone lesions are seen. IMPRESSION: Negative. Electronically Signed   By: Lucienne Capers M.D.   On: 01/19/2018 22:24   Mr Lumbar Spine Wo Contrast  Result Date: 01/20/2018 CLINICAL DATA:  Fall yesterday.  Pain. EXAM: MRI LUMBAR SPINE WITHOUT CONTRAST TECHNIQUE: Multiplanar, multisequence MR imaging of the lumbar spine was performed. No intravenous contrast was administered. COMPARISON:  Lumbar spine radiographs 01/19/2018 FINDINGS: Segmentation: 5 non rib-bearing lumbar type vertebral bodies are present. The lowest fully formed vertebral body is L5. Alignment:  AP alignment is anatomic. Vertebrae: Remote superior endplate fractures are present at L1 and L5. A superior endplate fracture of L3 on the right demonstrates marrow signal change battle with acute/subacute fracture. There is 30% loss of height anteriorly. Stir sequences demonstrate edema extending into the right pedicle. There is no retropulsed bone. Conus medullaris and cauda equina: Conus extends to the L1-2 level. Conus and cauda equina appear normal. Paraspinal  and other soft tissues: Limited imaging the abdomen is unremarkable. There is no significant adenopathy. No solid organ lesions are present. Disc levels: L1-2: Negative. L2-3: Mild disc bulging is present without significant stenosis. L3-4: Mild disc bulging is present. There is no significant stenosis. L4-5: A broad-based disc protrusion is present. Mild facet hypertrophy is present bilaterally. Mild subarticular and foraminal narrowing is present bilaterally. L5-S1: A shallow central disc protrusion is present. The central canal is patent. Foramina are patent bilaterally. IMPRESSION: 1. Acute/subacute superior endplate fracture of L3 on the right with approximately 30% loss of height. No retropulsed bone or associated stenosis. 2. Remote superior endplate fractures at L1 and L5. 3. Broad-based disc protrusion at L4-5 with mild subarticular and foraminal stenosis bilaterally. Electronically Signed   By: San Morelle M.D.   On: 01/20/2018 06:51   Dg Chest Portable 1 View  Result Date: 01/20/2018 CLINICAL DATA:  Pain. EXAM: PORTABLE CHEST 1 VIEW COMPARISON:  09/19/2017 FINDINGS: 1122 hours. The cardio pericardial silhouette is enlarged. There is pulmonary vascular congestion without overt pulmonary edema. Interstitial markings are diffusely coarsened with chronic features. Hazy opacity in the right suprahilar lung may be related to positioning. The visualized bony structures of the thorax are intact. IMPRESSION: Hazy opacity suprahilar right lung may be related  to positioning. Dedicated upright PA film recommended when patient is able, to further evaluate. Electronically Signed   By: Misty Stanley M.D.   On: 01/20/2018 13:47    Scheduled Meds: . arformoterol  15 mcg Nebulization BID  . baclofen  5 mg Oral TID  . budesonide  0.5 mg Nebulization BID  . fentaNYL  50 mcg Transdermal Q72H  . ferrous sulfate  325 mg Oral Q breakfast  . insulin aspart  0-15 Units Subcutaneous TID WC  .  ipratropium-albuterol  3 mL Nebulization TID  . levothyroxine  125 mcg Oral Daily  . lidocaine  1 patch Transdermal Q24H  . methylPREDNISolone (SOLU-MEDROL) injection  40 mg Intravenous Q12H  . nicotine  14 mg Transdermal Daily  . ticagrelor  60 mg Oral BID  . Warfarin - Pharmacist Dosing Inpatient   Does not apply q1800   Continuous Infusions: . sodium chloride 10 mL/hr at 01/21/18 6283    Principal Problem:   COPD exacerbation (HCC) Active Problems:   Chronic respiratory failure (HCC)   Vertebral fracture, osteoporotic (HCC)   TOBACCO ABUSE   History of pulmonary embolus (PE)   COPD (chronic obstructive pulmonary disease) with emphysema (HCC)   Factor V Leiden, prothrombin gene mutation ([place])   Hyperglycemia   Hypothyroidism   Hyperlipidemia    Time spent: 47 minutes    Parkman NP [facility]  If 7PM-7AM, please contact night-coverage at [email or web address], password [facility] 01/21/2018, 9:55 AM  LOS: 0 days

## 2018-01-21 NOTE — Clinical Social Work Note (Signed)
CSW spoke with pt at bedside. Pt is Medicare, Observation therefore Medicare will not cover SNF stay. Pt is unable to private pay. RNCM notified. Dispo to be determined.  [place], [place]

## 2018-01-21 NOTE — Progress Notes (Signed)
Initial Nutrition Assessment  DOCUMENTATION CODES:   Not applicable  INTERVENTION:    Continue Heart Healthy diet  NUTRITION DIAGNOSIS:   Increased nutrient needs related to chronic illness as evidenced by estimated needs  GOAL:   Patient will meet greater than or equal to 90% of their needs  MONITOR:   PO intake, Supplement acceptance, Labs, Skin, Weight trends, I & O's  REASON FOR ASSESSMENT:   Consult Assessment of nutrition requirement/status, COPD Protocol  ASSESSMENT:   68 y.o. Female with PMH significant of COPD, chronic hypoxia, ongoing smoker, factor V Leyden and history of pulmonary embolism on Coumadin.  She presented to the emergency room after a fall at home.  MRI revealed L3 compression fx.    RD spoke with pt; she is sitting in her recliner. Reports she ate 100% of her breakfast this AM. Typically consumes 2-3 meals per day at home.  Pt shares she has Factor V Leiden (blood-clotting disorder). States she can't drink supplements bc of the Vitamin K content. Pt reveals she's been progressively losing weight x 2 years.  Labs & medications reviewed. CO2 36 (H).  NUTRITION - FOCUSED PHYSICAL EXAM:    Most Recent Value  Orbital Region  No depletion  Upper Arm Region  Mild depletion  Thoracic and Lumbar Region  Mild depletion  Buccal Region  No depletion  Temple Region  Mild depletion  Clavicle Bone Region  Mild depletion  Clavicle and Acromion Bone Region  Mild depletion  Scapular Bone Region  Mild depletion  Dorsal Hand  No depletion  Patellar Region  Mild depletion  Anterior Thigh Region  Mild depletion  Posterior Calf Region  Mild depletion  Edema (RD Assessment)  None     Diet Order:   Diet Order            Diet Heart Room service appropriate? Yes; Fluid consistency: Thin  Diet effective now             EDUCATION NEEDS:   No education needs have been identified at this time  Skin:  Skin Assessment: Reviewed RN Assessment  Last  BM:  1/11  Height:   Ht Readings from Last 1 Encounters:  01/19/18 5\' 4"  (1.626 m)   Weight:   Wt Readings from Last 1 Encounters:  01/21/18 72.8 kg   Wt Readings from Last 10 Encounters:  01/21/18 72.8 kg  10/04/17 72.8 kg  09/07/17 76.3 kg  09/05/17 75.4 kg  08/30/17 73.3 kg  08/15/17 73.9 kg  05/16/17 72.3 kg  03/05/17 70.8 kg  01/24/17 76.1 kg  09/21/16 77.5 kg   BMI:  Body mass index is 27.55 kg/m.  Estimated Nutritional Needs:   Kcal:  1800-2000  Protein:  90-105 gm  Fluid:  1.8-2.0 L  Arthur Holms, RD, LDN Pager #: [phone] After-Hours Pager #: [phone]

## 2018-01-21 NOTE — Care Management Obs Status (Signed)
[place] NOTIFICATION   Patient Details  Name: [name] MRN: [id number] Date of Birth: October 06, 1950   Medicare Observation Status Notification Given:  Yes    Zenon Mayo, RN 01/21/2018, 12:43 PM

## 2018-01-22 LAB — CBC
HCT: 31.6 % — ABNORMAL LOW (ref 36.0–46.0)
Hemoglobin: 9.7 g/dL — ABNORMAL LOW (ref 12.0–15.0)
MCH: 29 pg (ref 26.0–34.0)
MCHC: 30.7 g/dL (ref 30.0–36.0)
MCV: 94.6 fL (ref 80.0–100.0)
Platelets: 186 10*3/uL (ref 150–400)
RBC: 3.34 MIL/uL — ABNORMAL LOW (ref 3.87–5.11)
RDW: 15.7 % — ABNORMAL HIGH (ref 11.5–15.5)
WBC: 11.3 10*3/uL — ABNORMAL HIGH (ref 4.0–10.5)
nRBC: 0.2 % (ref 0.0–0.2)

## 2018-01-22 LAB — BASIC METABOLIC PANEL
Anion gap: 8 (ref 5–15)
BUN: 19 mg/dL (ref 8–23)
CO2: 30 mmol/L (ref 22–32)
Calcium: 8.3 mg/dL — ABNORMAL LOW (ref 8.9–10.3)
Chloride: 99 mmol/L (ref 98–111)
Creatinine, Ser: 0.92 mg/dL (ref 0.44–1.00)
GFR calc Af Amer: >60 mL/min (ref >60)
GFR calc non Af Amer: >60 mL/min (ref >60)
Glucose, Bld: 131 mg/dL — ABNORMAL HIGH (ref 70–99)
Potassium: 4.8 mmol/L (ref 3.5–5.1)
Sodium: 137 mmol/L (ref 135–145)

## 2018-01-22 LAB — GLUCOSE, CAPILLARY
Glucose-Capillary: 126 mg/dL — ABNORMAL HIGH (ref 70–99)
Glucose-Capillary: 131 mg/dL — ABNORMAL HIGH (ref 70–99)
Glucose-Capillary: 124 mg/dL — ABNORMAL HIGH (ref 70–99)
Glucose-Capillary: 107 mg/dL — ABNORMAL HIGH (ref 70–99)

## 2018-01-22 LAB — PROTIME-INR
INR: 3
Prothrombin Time: 30.7 seconds — ABNORMAL HIGH (ref 11.4–15.2)

## 2018-01-22 MED ORDER — WARFARIN SODIUM 3 MG PO TABS
6.0000 mg | ORAL_TABLET | Freq: Once | ORAL | Status: AC
  Administered 2018-01-22: 6 mg via ORAL
  Filled 2018-01-22: qty 2

## 2018-01-22 NOTE — Progress Notes (Signed)
Physical Therapy Treatment Patient Details Name: [name] MRN: [id number] DOB: December 06, 1950 Today's Date: 01/22/2018    History of Present Illness 68 y.o. female with medical history significant of COPD, chronic hypoxia on 2 to 3 L of oxygen, ongoing smoker, coronary artery disease, factor V [place] and history of pulmonary embolism on Coumadin.  She presented to the emergency room after a fall at home.  MRI revealed L3 compression fx.     PT Comments    Patient received in bed, now willing to participate in therapy. Able to perform rolling in bed with MinA, functional transfers with Min guard although very poor safety awareness and difficulty sequencing, and gait x67f with min guard/RW on 3LPM O2. TLSO applied totalA sitting EOB, SpO2 during gait remained in 90s on 3LPM O2. Fatigued with ongoing gait and gait mechanics quickly deteriorated with increased scissoring and reduced balance/stability. She was left up in the chair with all needs met and concerns addressed this afternoon.     Follow Up Recommendations  SNF     Equipment Recommendations  None recommended by PT    Recommendations for Other Services       Precautions / Restrictions Precautions Precautions: Fall;Back Precaution Comments: Pt educated on 3/3 back precautions. Required Braces or Orthoses: Spinal Brace Spinal Brace: Thoracolumbosacral orthotic;Applied in sitting position Restrictions Weight Bearing Restrictions: No    Mobility  Bed Mobility Overal bed mobility: Needs Assistance Bed Mobility: Rolling;Sidelying to Sit Rolling: Min assist Sidelying to sit: Min assist       General bed mobility comments: MinA/Max cues for log rolling technique  Transfers Overall transfer level: Needs assistance Equipment used: Rolling walker (2 wheeled) Transfers: Sit to/from Stand Sit to Stand: Min guard         General transfer comment: poor hand placement and sequencing despite cues from PT, but able to  perform transfers with min guard   Ambulation/Gait Ambulation/Gait assistance: Min guard Gait Distance (Feet): 80 Feet(x2 ) Assistive device: Rolling walker (2 wheeled) Gait Pattern/deviations: Step-through pattern;Decreased step length - right;Decreased step length - left;Decreased stride length;Scissoring;Drifts right/left;Narrow base of support     General Gait Details: narrow BOS but fast gait speed, scissoring increased with fatigue; SpO2 in 90s on 3LPM during gait    Stairs             Wheelchair Mobility    Modified Rankin (Stroke Patients Only)       Balance Overall balance assessment: Needs assistance Sitting-balance support: Feet supported;Single extremity supported Sitting balance-Leahy Scale: Good     Standing balance support: Bilateral upper extremity supported;During functional activity Standing balance-Leahy Scale: Poor Standing balance comment: reliant on RW, min guard for safety                             Cognition Arousal/Alertness: Awake/alert Behavior During Therapy: WFL for tasks assessed/performed Overall Cognitive Status: Within Functional Limits for tasks assessed                                        Exercises      General Comments General comments (skin integrity, edema, etc.): 3LPM O2       Pertinent Vitals/Pain Pain Assessment: Faces Faces Pain Scale: Hurts even more Pain Location: back with mobility (grimaces and crys out but did not give specific number) Pain Descriptors / Indicators: Grimacing;Guarding;Moaning Pain  Intervention(s): Limited activity within patient's tolerance;Monitored during session    Home Living                      Prior Function            PT Goals (current goals can now be found in the care plan section) Acute Rehab PT Goals Patient Stated Goal: decrease pain PT Goal Formulation: With patient Time For Goal Achievement: 01/28/18 Potential to Achieve Goals:  Good Progress towards PT goals: Progressing toward goals    Frequency    Min 5X/week      PT Plan Current plan remains appropriate    Co-evaluation              AM-PAC PT "6 Clicks" Mobility   Outcome Measure  Help needed turning from your back to your side while in a flat bed without using bedrails?: A Little Help needed moving from lying on your back to sitting on the side of a flat bed without using bedrails?: A Little Help needed moving to and from a bed to a chair (including a wheelchair)?: A Little Help needed standing up from a chair using your arms (e.g., wheelchair or bedside chair)?: A Little Help needed to walk in hospital room?: A Little Help needed climbing 3-5 steps with a railing? : A Lot 6 Click Score: 17    End of Session Equipment Utilized During Treatment: Gait belt;Back brace;Oxygen Activity Tolerance: Patient tolerated treatment well Patient left: in chair;with chair alarm set;with call bell/phone within reach Nurse Communication: Mobility status PT Visit Diagnosis: Other abnormalities of gait and mobility (R26.89);Difficulty in walking, not elsewhere classified (R26.2);Pain;History of falling (Z91.81)     Time: 2589-4834 PT Time Calculation (min) (ACUTE ONLY): 30 min  Charges:  $Gait Training: 8-22 mins $Therapeutic Activity: 8-22 mins                     Deniece Ree PT, DPT, CBIS  Supplemental Physical Therapist [place]    Pager [phone] Acute Rehab Office [phone]

## 2018-01-22 NOTE — Progress Notes (Signed)
ANTICOAGULATION CONSULT NOTE- follow up Pharmacy Consult for Coumadin Indication: h/o factor V leiden  def, h/o PE  Allergies  Allergen Reactions  . Dilaudid [Hydromorphone Hcl] Hives and Nausea Only  . Minocycline Hcl     REACTION: Dizzy  . Prednisone     REACTION: feels like throat swelling, hallucinations  . Varenicline Tartrate     REACTION: Dizzy(chantix)   . Zocor [Simvastatin - High Dose] Other (See Comments)    myalgia    Patient Measurements: Height: 5\' 4"  (162.6 cm) Weight: 160 lb 7.9 oz (72.8 kg) IBW/kg (Calculated) : 54.7  Vital Signs: Temp: 98.3 F (36.8 C) (01/14 0330) Temp Source: Oral (01/14 0330) BP: 139/61 (01/14 0330) Pulse Rate: 95 (01/14 0330)  Labs: Recent Labs    01/19/18 2121 01/21/18 0234 01/22/18 0218  HGB 11.1* 9.7* 9.7*  HCT 38.6 33.0* 31.6*  PLT 211 168 186  LABPROT 28.0* 36.3* 30.7*  INR 2.67 3.72 3.00  CREATININE 0.89 0.86 0.92  TROPONINI <0.03  --   --     Estimated Creatinine Clearance: 58 mL/min (by C-G formula based on SCr of 0.92 mg/dL).   Medical History: Past Medical History:  Diagnosis Date  . Acute MI ([place])    x4, code blue x3  . Arteriosclerotic cardiovascular disease (ASCVD) 2002   Inf STEMI-2002. 2003-cutting balloon + brachytherapy for restenosis; subsequent acute stent thrombosis 06/2010 requiring 2 separate interventions (Zeta stent, then repeat cath with thrombectomy). focal basal inf AK, nl EF; 03/2011: Patent stents, minor nonobst  residual dz, nl EF; neg stress nuclear in 2008 and stress echo in 2009  . Chronic anticoagulation    Warfarin plus ticagrelor  . Chronic respiratory failure ([place]) 08/27/2013   On 2L 02  . COPD (chronic obstructive pulmonary disease) ([place])    02 dependent  . COPD (chronic obstructive pulmonary disease) ([place])   . Factor 5 Leiden mutation, heterozygous ([place])   . Factor V Leiden, prothrombin gene mutation ([place]) 2006  . Hyperlipidemia   . Hypothyroidism   . Noncompliance   . Pelvic  fracture ([place]) 2009  . Pulmonary embolism ([place]) 2006   Associated with deep vein thrombosis-2006; + factor V Leiden  . Tobacco abuse    50 pack years   Assessment: CC/HPI: Fall with unrelenting pain. PYK:DXIPJASNKN degenerative changes with acute L3 compression fracture. Also COPD exac.  On Coumadin 6mg  daily exc 5mg  on MWF PTA for factor V leiden  def, h/o PE.   INR was therapeutic on admission 01/19/18.  Yesterday INR up to 3.72. Today INR is 3.0, therapeutic.  Warfarin dose held 01/21/18 due to INR above goal.  Hgb down to 9.7 on 1/13 and remains low/stable at 9.7 today, pltc wnl. No bleeding noted. Continues on pta Ticagrelor.   Goal of Therapy:  INR 2-3 Monitor platelets by anticoagulation protocol: Yes   Plan:  Coumadin  6mg  x1 tonight Monitor daily INR, CBC, s/s of bleed  Nicole Cella, RPh Clinical Pharmacist Phone number [phone] 01/22/2018 9:16 AM

## 2018-01-22 NOTE — Progress Notes (Signed)
PT Cancellation Note  Patient Details Name: Jazmon Kos MRN: [id number] DOB: [birth year]   Cancelled Treatment:    Reason Eval/Treat Not Completed: Pain limiting ability to participate patient in 9/10 pain, declines even bed level activity due to pain. RN made aware. Will attempt to check back later in the day if time/schedule allow.    Deniece Ree PT, DPT, CBIS  Supplemental Physical Therapist [facility]    Pager [phone] Acute Rehab Office [phone]

## 2018-01-22 NOTE — Progress Notes (Signed)
PROGRESS NOTE    [name]  LYY:[id number] DOB: 1950-11-20 DOA: 01/19/2018 PCP: [name], NP  Brief Narrative: This is a chronically ill 68 year old female with tobacco abuse, COPD/chronic respiratory failure on 3 L home O2, chronic pain on fentanyl, presented to the ED after mechanical fall she was found to have an L3 compression fracture, hospital course was complicated by COPD exacerbation and increased O2 requirements.  Assessment & Plan:   COPD with exacerbation: -Improving, cutdown IV steroids -Incentive spirometer, flutter valve, duo nebs -wean O2 down to 3 L   Traumatic and osteoporotic L3 fracture: -Neurologically stable. Seen by spinal surgery with no surgery required. Poor pain control this am. -Continue fentanyl patch, added lidocaine patch, Vicodin as needed and muscle relaxer -Physical therapy following, SNF recommended -We will discuss with social worker  Hyperglycemia: steroid induced most likely -Stable, continue sliding scale insulin  History of VTE and factor V Leidenmutation  -Continue Coumadin, INR therapeutic  Hypothyroidism:  -On Synthroid  Smoker:Counseled to quit.  -nicotine patch.  Debility weakness, fall risk -PT OT recommended SNF, social work and case management following  Code Status: full Family Communication: none present Disposition Plan: SNF is ideal if this is a possibility   Consultants:   NSG   Procedures:   Antimicrobials:    Subjective: -Breathing improving, complains of chronic pain  Objective: Vitals:   01/21/18 1940 01/21/18 2334 01/22/18 0330 01/22/18 0700  BP: (!) 124/53 136/65 139/61   Pulse: 86 72 95   Resp: 19 18 (!) 21   Temp:  98.2 F (36.8 C) 98.3 F (36.8 C)   TempSrc:  Oral Oral   SpO2: 99% 95% 92% 99%  Weight:      Height:        Intake/Output Summary (Last 24 hours) at 01/22/2018 1047 Last data filed at 01/21/2018 2216 Gross per 24 hour  Intake -  Output 700 ml  Net  -700 ml   Filed Weights   01/19/18 1935 01/21/18 0500  Weight: 68 kg 72.8 kg    Examination:  General exam: Debilitated, chronically ill-appearing female appears calm and comfortable  Respiratory system: Improved air movement, no wheezes at this time Cardiovascular system: S1 & S2 heard, RRR.  Gastrointestinal system: Abdomen is nondistended, soft and nontender.Normal bowel sounds heard. Central nervous system: Alert and oriented. No focal neurological deficits. Extremities: No edema Skin: No rashes, lesions or ulcers Psychiatry: Judgement and insight appear normal. Mood & affect appropriate.     Data Reviewed:   CBC: Recent Labs  Lab 01/19/18 2121 01/21/18 0234 01/22/18 0218  WBC 9.3 7.5 11.3*  HGB 11.1* 9.7* 9.7*  HCT 38.6 33.0* 31.6*  MCV 100.3* 96.5 94.6  PLT 211 168 127   Basic Metabolic Panel: Recent Labs  Lab 01/19/18 2121 01/21/18 0234 01/22/18 0218  NA 140 138 137  K 3.8 4.1 4.8  CL 96* 97* 99  CO2 37* 36* 30  GLUCOSE 160* 153* 131*  BUN 21 18 19   CREATININE 0.89 0.86 0.92  CALCIUM 8.8* 8.4* 8.3*   GFR: Estimated Creatinine Clearance: 58 mL/min (by C-G formula based on SCr of 0.92 mg/dL). Liver Function Tests: No results for input(s): AST, ALT, ALKPHOS, BILITOT, PROT, ALBUMIN in the last 168 hours. No results for input(s): LIPASE, AMYLASE in the last 168 hours. No results for input(s): AMMONIA in the last 168 hours. Coagulation Profile: Recent Labs  Lab 01/19/18 2121 01/21/18 0234 01/22/18 0218  INR 2.67 3.72 3.00   Cardiac Enzymes: Recent Labs  Lab 01/19/18 2121  TROPONINI <0.03   BNP (last 3 results) No results for input(s): PROBNP in the last 8760 hours. HbA1C: No results for input(s): HGBA1C in the last 72 hours. CBG: Recent Labs  Lab 01/21/18 1134 01/21/18 1707 01/21/18 2138 01/22/18 0729  GLUCAP 113* 134* 132* 126*   Lipid Profile: No results for input(s): CHOL, HDL, LDLCALC, TRIG, CHOLHDL, LDLDIRECT in the last 72  hours. Thyroid Function Tests: No results for input(s): TSH, T4TOTAL, FREET4, T3FREE, THYROIDAB in the last 72 hours. Anemia Panel: No results for input(s): VITAMINB12, FOLATE, FERRITIN, TIBC, IRON, RETICCTPCT in the last 72 hours. Urine analysis:    Component Value Date/Time   COLORURINE YELLOW 01/19/2018 2130   APPEARANCEUR HAZY (A) 01/19/2018 2130   LABSPEC 1.019 01/19/2018 2130   PHURINE 6.0 01/19/2018 2130   GLUCOSEU NEGATIVE 01/19/2018 2130   HGBUR LARGE (A) 01/19/2018 2130   BILIRUBINUR NEGATIVE 01/19/2018 2130   BILIRUBINUR n 09/21/2016 1432   KETONESUR NEGATIVE 01/19/2018 2130   PROTEINUR 100 (A) 01/19/2018 2130   UROBILINOGEN 0.2 09/21/2016 1432   UROBILINOGEN 0.2 04/13/2009 2025   NITRITE NEGATIVE 01/19/2018 2130   LEUKOCYTESUR NEGATIVE 01/19/2018 2130   Sepsis Labs: @LABRCNTIP (procalcitonin:4,lacticidven:4)  )No results found for this or any previous visit (from the past 240 hour(s)).       Radiology Studies: Dg Chest Portable 1 View  Result Date: 01/20/2018 CLINICAL DATA:  Pain. EXAM: PORTABLE CHEST 1 VIEW COMPARISON:  09/19/2017 FINDINGS: 1122 hours. The cardio pericardial silhouette is enlarged. There is pulmonary vascular congestion without overt pulmonary edema. Interstitial markings are diffusely coarsened with chronic features. Hazy opacity in the right suprahilar lung may be related to positioning. The visualized bony structures of the thorax are intact. IMPRESSION: Hazy opacity suprahilar right lung may be related to positioning. Dedicated upright PA film recommended when patient is able, to further evaluate. Electronically Signed   By: Misty Stanley M.D.   On: 01/20/2018 13:47        Scheduled Meds: . arformoterol  15 mcg Nebulization BID  . baclofen  5 mg Oral TID  . budesonide  0.5 mg Nebulization BID  . fentaNYL  50 mcg Transdermal Q72H  . ferrous sulfate  325 mg Oral Q breakfast  . insulin aspart  0-15 Units Subcutaneous TID WC  .  ipratropium-albuterol  3 mL Nebulization TID  . levothyroxine  125 mcg Oral Daily  . lidocaine  1 patch Transdermal Q24H  . methylPREDNISolone (SOLU-MEDROL) injection  40 mg Intravenous Q12H  . nicotine  14 mg Transdermal Daily  . ticagrelor  60 mg Oral BID  . warfarin  6 mg Oral ONCE-1800  . Warfarin - Pharmacist Dosing Inpatient   Does not apply q1800   Continuous Infusions: . sodium chloride 10 mL/hr at 01/21/18 0953     LOS: 1 day    Time spent: 63min    Domenic Polite, MD [facility] Page via [email or web address], password [email or web address] After 7PM please contact night-coverage  01/22/2018, 10:47 AM

## 2018-01-22 NOTE — NC FL2 (Signed)
[place] LEVEL OF CARE SCREENING TOOL     IDENTIFICATION  Patient Name: [name] Birthdate: 1950-04-14 Sex: female Admission Date (Current Location): 01/19/2018  [facility] and [place] Number:  Herbalist and Address:  [facility], [place] [address], [place], [place] [address]      Provider Number: [id number]  Attending Physician Name and Address:  Domenic Polite, MD  Relative Name and Phone Number:       Current Level of Care: Hospital Recommended Level of Care: [place] Prior Approval Number:    Date Approved/Denied:   PASRR Number: [id number]  Discharge Plan: SNF    Current Diagnoses: Patient Active Problem List   Diagnosis Date Noted  . Hyperglycemia 01/21/2018  . COPD with exacerbation ([place]) 01/21/2018  . Vertebral fracture, osteoporotic ([place]) 01/20/2018  . Rectal bleeding 09/05/2017  . Left lower quadrant pain 09/05/2017  . Oxygen dependent 09/05/2017  . Chronic back pain 05/16/2017  . COPD exacerbation ([place]) 02/19/2017  . Tachycardia 02/19/2017  . Influenza A 02/19/2017  . Acute respiratory failure ([place]) 02/19/2017  . Dyspnea 11/26/2014  . Loss of weight 11/26/2014  . Chronic respiratory failure ([place]) 08/27/2013  . Chest pain 08/24/2013  . Encounter for therapeutic drug monitoring 02/13/2013  . Abnormal weight loss 03/21/2012  . Cerebrovascular disease 11/15/2011  . CAD S/P percutaneous coronary angioplasty   . Factor V Leiden, prothrombin gene mutation ([place])   . Hyperlipidemia   . Chronic anticoagulation   . Hypothyroidism 11/23/2006  . TOBACCO ABUSE 11/23/2006  . History of pulmonary embolus (PE) 11/23/2006  . COPD (chronic obstructive pulmonary disease) with emphysema ([place]) 11/23/2006    Orientation RESPIRATION BLADDER Height & Weight     Self, Time, Situation, Place  O2(Nasal Cannula 2L) Continent Weight: 160 lb 7.9 oz (72.8 kg) Height:  5\' 4"  (162.6 cm)  BEHAVIORAL  SYMPTOMS/MOOD NEUROLOGICAL BOWEL NUTRITION STATUS      Continent Diet(hearth healthy, thin liquids)  AMBULATORY STATUS COMMUNICATION OF NEEDS Skin   Limited Assist Verbally Normal                       Personal Care Assistance Level of Assistance  Bathing, Feeding, Dressing Bathing Assistance: Limited assistance Feeding assistance: Independent Dressing Assistance: Limited assistance     Functional Limitations Info  Sight, Hearing, Speech Sight Info: Adequate Hearing Info: Adequate Speech Info: Adequate    SPECIAL CARE FACTORS FREQUENCY  PT (By licensed PT), OT (By licensed OT)     PT Frequency: 5x OT Frequency: 5x            Contractures Contractures Info: Not present    Additional Factors Info  Code Status, Allergies Code Status Info: Full Code Allergies Info: Dilaudid Hydromorphone Hcl, Minocycline Hcl, Prednisone, Varenicline Tartrate, Zocor Simvastatin - High Dose           Current Medications (01/22/2018):  This is the current hospital active medication list Current Facility-Administered Medications  Medication Dose Route Frequency Provider Last Rate Last Dose  . 0.9 %  sodium chloride infusion   Intravenous Continuous Radene Gunning, NP 10 mL/hr at 01/21/18 [id number]    . acetaminophen (TYLENOL) tablet 650 mg  650 mg Oral Q6H PRN Barb Merino, MD       Or  . acetaminophen (TYLENOL) suppository 650 mg  650 mg Rectal Q6H PRN Barb Merino, MD      . albuterol (PROVENTIL) (2.5 MG/3ML) 0.083% nebulizer solution 2.5 mg  2.5 mg Nebulization Q4H PRN Barb Merino, MD      . arformoterol (BROVANA) nebulizer solution 15 mcg  15 mcg Nebulization BID Barb Merino, MD   15 mcg at 01/22/18 0805  . baclofen (LIORESAL) tablet 5 mg  5 mg Oral TID Barb Merino, MD   5 mg at 01/22/18 0849  . benzonatate (TESSALON) capsule 100 mg  100 mg Oral BID PRN Barb Merino, MD      . budesonide (PULMICORT) nebulizer solution 0.5 mg  0.5 mg Nebulization BID Barb Merino, MD    0.5 mg at 01/22/18 0805  . fentaNYL (DURAGESIC - dosed mcg/hr) 50 mcg  50 mcg Transdermal Q72H Barb Merino, MD   50 mcg at 01/21/18 0941  . ferrous sulfate tablet 325 mg  325 mg Oral Q breakfast Barb Merino, MD   325 mg at 01/22/18 0849  . HYDROcodone-acetaminophen (NORCO/VICODIN) 5-325 MG per tablet 1 tablet  1 tablet Oral Q6H PRN Barb Merino, MD   1 tablet at 01/21/18 2147  . insulin aspart (novoLOG) injection 0-15 Units  0-15 Units Subcutaneous TID WC Radene Gunning, NP   2 Units at 01/22/18 0850  . ipratropium-albuterol (DUONEB) 0.5-2.5 (3) MG/3ML nebulizer solution 3 mL  3 mL Nebulization TID Barb Merino, MD   3 mL at 01/22/18 0805  . levothyroxine (SYNTHROID, LEVOTHROID) tablet 125 mcg  125 mcg Oral Daily Barb Merino, MD   125 mcg at 01/22/18 2878  . lidocaine (LIDODERM) 5 % 1 patch  1 patch Transdermal Q24H Barb Merino, MD   1 patch at 01/21/18 1349  . methylPREDNISolone sodium succinate (SOLU-MEDROL) 40 mg/mL injection 40 mg  40 mg Intravenous Q12H Radene Gunning, NP   40 mg at 01/22/18 0850  . nicotine (NICODERM CQ - dosed in mg/24 hours) patch 14 mg  14 mg Transdermal Daily Barb Merino, MD   14 mg at 01/20/18 1723  . nitroGLYCERIN (NITROSTAT) SL tablet 0.4 mg  0.4 mg Sublingual Q5 min PRN Barb Merino, MD      . polyethylene glycol (MIRALAX / GLYCOLAX) packet 17 g  17 g Oral Daily PRN Barb Merino, MD   17 g at 01/21/18 0145  . ticagrelor (BRILINTA) tablet 60 mg  60 mg Oral BID Barb Merino, MD   60 mg at 01/22/18 0849  . traZODone (DESYREL) tablet 50 mg  50 mg Oral QHS PRN Barb Merino, MD      . warfarin (COUMADIN) tablet 6 mg  6 mg Oral ONCE-1800 Domenic Polite, MD      . Warfarin - Pharmacist Dosing Inpatient   Does not apply q1800 Karren Cobble [place] at 01/21/18 1800     Discharge Medications: Please see discharge summary for a list of discharge medications.  Relevant Imaging Results:  Relevant Lab Results:   Additional  Information SSN: [id number]  Eileen Stanford, LCSW

## 2018-01-22 NOTE — Clinical Social Work Note (Signed)
Pt is not a [employer] pt therefore she is not elegible for the SNF 3 day waiver. Pt was changed to inpatient on 1/14 therefore Medicare will cover SNF stay if pt d/c any date after 1/16--pt aware. Pt has chosen a bed at [facility].  [place], [place]

## 2018-01-23 LAB — GLUCOSE, CAPILLARY
GLUCOSE-CAPILLARY: 109 mg/dL — AB (ref 70–99)
Glucose-Capillary: 144 mg/dL — ABNORMAL HIGH (ref 70–99)
Glucose-Capillary: 157 mg/dL — ABNORMAL HIGH (ref 70–99)
Glucose-Capillary: 95 mg/dL (ref 70–99)

## 2018-01-23 LAB — PROTIME-INR
INR: 1.83
Prothrombin Time: 20.9 seconds — ABNORMAL HIGH (ref 11.4–15.2)

## 2018-01-23 MED ORDER — BACLOFEN 5 MG HALF TABLET
10.0000 mg | ORAL_TABLET | Freq: Three times a day (TID) | ORAL | Status: DC
  Administered 2018-01-23 – 2018-01-24 (×3): 10 mg via ORAL
  Filled 2018-01-23 (×4): qty 2

## 2018-01-23 MED ORDER — BISACODYL 10 MG RE SUPP
10.0000 mg | Freq: Every day | RECTAL | Status: DC | PRN
  Administered 2018-01-23: 10 mg via RECTAL
  Filled 2018-01-23: qty 1

## 2018-01-23 MED ORDER — WARFARIN SODIUM 3 MG PO TABS
6.0000 mg | ORAL_TABLET | Freq: Once | ORAL | Status: AC
  Administered 2018-01-23: 6 mg via ORAL
  Filled 2018-01-23: qty 2

## 2018-01-23 MED ORDER — SENNOSIDES-DOCUSATE SODIUM 8.6-50 MG PO TABS
2.0000 | ORAL_TABLET | Freq: Two times a day (BID) | ORAL | Status: DC
  Administered 2018-01-23 – 2018-01-24 (×3): 2 via ORAL
  Filled 2018-01-23 (×3): qty 2

## 2018-01-23 MED ORDER — POLYETHYLENE GLYCOL 3350 17 G PO PACK
17.0000 g | PACK | Freq: Every day | ORAL | Status: DC
  Administered 2018-01-23 – 2018-01-24 (×2): 17 g via ORAL
  Filled 2018-01-23 (×2): qty 1

## 2018-01-23 MED ORDER — BACLOFEN 5 MG HALF TABLET
5.0000 mg | ORAL_TABLET | Freq: Once | ORAL | Status: AC
  Administered 2018-01-23: 5 mg via ORAL
  Filled 2018-01-23: qty 1

## 2018-01-23 NOTE — Consult Note (Signed)
            [facility] CM Primary Care Navigator  01/23/2018  [name] [birth year] [id number]   Seenpatient at the bedside toidentify possible discharge needs.  Patientreports that she "had a fall at home and re-injured back". [facility] had presented to [facility] after a [facility] and was transferred to [facility] due to unrelenting pain with MR revealing multilevel degenerative changes with acute L3 compression fracture and her hospital course was complicated by COPD exacerbation and increased O2 requirements.  Patientendorses Beaulah Dinning, NP with [facility] [place] as her primary care provider.   [facility] [place] [facility] without[place]y.  Patient reports that she has been managing her medications athome using "pill box" system filled every week.  Patientverbalized that she has been driving prior to admission. 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 Her daughter just had a rotator cuff surgery and has not yet been released to drive by the doctor. Patient is hoping to be able to drive once discharge from rehab facility to transport selfto her doctors' appointments, otherwise, she will be needing transportation then.     Patient lives alone and she is usually the primary caregiver for herself at home. Her closest relative is a daughter that lives in [place]. She reports having limited support from her daughter, granddaughter and a friend Juanda Crumble- who is also a Administrator) who provide assistance with her needs if they are available.   Anticipated plan for discharge isskilled nursing facility ([facility]for rehabilitationper therapy recommendation.  Patientvoiced understandingto call primary care provider's office whenshereturns backhome,for a post discharge follow-up visitwithin1- 2 weeksor sooner if needs arise.Patient letter (with PCP's contact  number) was provided asareminder.   Explained topatientregarding [facility] CM services available for health management andresourcesat homeandshe had expressed interest aboutit.Patient realizes the possibility of needing help with her health management issues/ concerns.  Referral made to [place] coordinator to follow-up patient's needs while in the rehab facility.  Patient is awaretodiscuss with primary care provider on hernext visit about further needsand assistancein managinghealth needs and conditions, once she gets back home. Patientexpressedunderstandingto seekreferral from primary care provider to [facility] care management if deemed necessary and appropriatefor anyservicesin the near future-once shereturns to home.   [facility] care management information was provided for futureneeds thatshemay have.  Primary care provider's office is listed as providing transition of care (TOC) follow-up.   For additional questions please contact:  Edwena Felty A. Sherryn Pollino, BSN, RN-BC [facility] PRIMARY CARE Navigator Cell: [phone]

## 2018-01-23 NOTE — Progress Notes (Signed)
PROGRESS NOTE    [name]  NLG:[id number] DOB: Dec 14, 1950 DOA: 01/19/2018 PCP: [name], NP  Brief Narrative: This is a chronically ill 68 year old female with tobacco abuse, COPD/chronic respiratory failure on 3 L home O2, chronic pain on fentanyl, presented to the ED after mechanical fall she was found to have an L3 compression fracture, hospital course was complicated by COPD exacerbation and increased O2 requirements.  Assessment & Plan:   COPD with exacerbation: -Improving, cutdown steroids -Incentive spirometer, flutter valve, duo nebs -wean O2 down to 3 L   Traumatic and osteoporotic L3 fracture: UnControlled pain -Neurologically stable. Seen by spinal surgery with no surgery required. Poor pain control this am. -Continue fentanyl patch, added lidocaine patch, Vicodin as needed and muscle relaxer, increase the dose of muscle relaxant. -Physical therapy following, SNF recommended  Hyperglycemia: steroid induced most likely -Stable, continue sliding scale insulin  History of VTE and factor V Leidenmutation  -Continue Coumadin, INR Therapeutic.  Monitor.  May require bridge.  Hypothyroidism:  -On Synthroid  Smoker:Counseled to quit.  -nicotine patch.  Constipation. Increase bowel regimen.  Debility weakness, fall risk -PT OT recommended SNF, social work and case management following  Code Status: full Family Communication: none present Disposition Plan: SNF is ideal if this is a possibility   Consultants:   NSG   Procedures:   Antimicrobials:    Subjective: Reports that her pain is not controlled.  No nausea no vomiting.  Passing gas but a feeling like abdominal is bloated.  Also having shortness of breath secondary to pain.  No nausea no vomiting.  Unable to sleep last night due to cough.  Objective: Vitals:   01/23/18 0747 01/23/18 0901 01/23/18 1311 01/23/18 1726  BP: 128/62   127/79  Pulse: 74   84  Resp:      Temp: 98.4 F  (36.9 C)   98.7 F (37.1 C)  TempSrc:      SpO2: 98% 93% 94% 99%  Weight:      Height:        Intake/Output Summary (Last 24 hours) at 01/23/2018 1914 Last data filed at 01/23/2018 1307 Gross per 24 hour  Intake -  Output 1500 ml  Net -1500 ml   Filed Weights   01/19/18 1935 01/21/18 0500 01/23/18 0506  Weight: 68 kg 72.8 kg 74.5 kg    Examination:  General exam: Debilitated, chronically ill-appearing female appears calm and comfortable  Respiratory system: Improved air movement, no wheezes at this time Cardiovascular system: S1 & S2 heard, RRR.  Gastrointestinal system: Abdomen is nondistended, soft and nontender.Normal bowel sounds heard. Central nervous system: Alert and oriented. No focal neurological deficits. Extremities: No edema Skin: No rashes, lesions or ulcers Psychiatry: Judgement and insight appear normal. Mood & affect appropriate.     Data Reviewed:   CBC: Recent Labs  Lab 01/19/18 2121 01/21/18 0234 01/22/18 0218  WBC 9.3 7.5 11.3*  HGB 11.1* 9.7* 9.7*  HCT 38.6 33.0* 31.6*  MCV 100.3* 96.5 94.6  PLT 211 168 081   Basic Metabolic Panel: Recent Labs  Lab 01/19/18 2121 01/21/18 0234 01/22/18 0218  NA 140 138 137  K 3.8 4.1 4.8  CL 96* 97* 99  CO2 37* 36* 30  GLUCOSE 160* 153* 131*  BUN 21 18 19   CREATININE 0.89 0.86 0.92  CALCIUM 8.8* 8.4* 8.3*   GFR: Estimated Creatinine Clearance: 58.6 mL/min (by C-G formula based on SCr of 0.92 mg/dL). Liver Function Tests: No results for input(s): AST, ALT,  ALKPHOS, BILITOT, PROT, ALBUMIN in the last 168 hours. No results for input(s): LIPASE, AMYLASE in the last 168 hours. No results for input(s): AMMONIA in the last 168 hours. Coagulation Profile: Recent Labs  Lab 01/19/18 2121 01/21/18 0234 01/22/18 0218 01/23/18 0238  INR 2.67 3.72 3.00 1.83   Cardiac Enzymes: Recent Labs  Lab 01/19/18 2121  TROPONINI <0.03   BNP (last 3 results) No results for input(s): PROBNP in the last 8760  hours. HbA1C: No results for input(s): HGBA1C in the last 72 hours. CBG: Recent Labs  Lab 01/22/18 1723 01/22/18 2218 01/23/18 0745 01/23/18 1129 01/23/18 1823  GLUCAP 124* 107* 109* 144* 157*   Lipid Profile: No results for input(s): CHOL, HDL, LDLCALC, TRIG, CHOLHDL, LDLDIRECT in the last 72 hours. Thyroid Function Tests: No results for input(s): TSH, T4TOTAL, FREET4, T3FREE, THYROIDAB in the last 72 hours. Anemia Panel: No results for input(s): VITAMINB12, FOLATE, FERRITIN, TIBC, IRON, RETICCTPCT in the last 72 hours. Urine analysis:    Component Value Date/Time   COLORURINE YELLOW 01/19/2018 2130   APPEARANCEUR HAZY (A) 01/19/2018 2130   LABSPEC 1.019 01/19/2018 2130   PHURINE 6.0 01/19/2018 2130   GLUCOSEU NEGATIVE 01/19/2018 2130   HGBUR LARGE (A) 01/19/2018 2130   BILIRUBINUR NEGATIVE 01/19/2018 2130   BILIRUBINUR n 09/21/2016 1432   KETONESUR NEGATIVE 01/19/2018 2130   PROTEINUR 100 (A) 01/19/2018 2130   UROBILINOGEN 0.2 09/21/2016 1432   UROBILINOGEN 0.2 04/13/2009 2025   NITRITE NEGATIVE 01/19/2018 2130   LEUKOCYTESUR NEGATIVE 01/19/2018 2130   Sepsis Labs: @LABRCNTIP (procalcitonin:4,lacticidven:4)  )No results found for this or any previous visit (from the past 240 hour(s)).       Radiology Studies: No results found.      Scheduled Meds: . arformoterol  15 mcg Nebulization BID  . baclofen  10 mg Oral TID  . budesonide  0.5 mg Nebulization BID  . fentaNYL  50 mcg Transdermal Q72H  . ferrous sulfate  325 mg Oral Q breakfast  . insulin aspart  0-15 Units Subcutaneous TID WC  . ipratropium-albuterol  3 mL Nebulization TID  . levothyroxine  125 mcg Oral Daily  . lidocaine  1 patch Transdermal Q24H  . methylPREDNISolone (SOLU-MEDROL) injection  40 mg Intravenous Q12H  . nicotine  14 mg Transdermal Daily  . polyethylene glycol  17 g Oral Daily  . senna-docusate  2 tablet Oral BID  . ticagrelor  60 mg Oral BID  . Warfarin - Pharmacist Dosing  Inpatient   Does not apply q1800   Continuous Infusions: . sodium chloride 10 mL/hr at 01/21/18 0953     LOS: 2 days    Time spent: 37min       [facility] Page via [email or web address], password [email or web address] After 7PM please contact night-coverage  01/23/2018, 7:14 PM

## 2018-01-23 NOTE — Progress Notes (Signed)
Physical Therapy Treatment Patient Details Name: [name] MRN: [id number] DOB: Oct 31, 1950 Today's Date: 01/23/2018    History of Present Illness 68 y.o. female with medical history significant of COPD, chronic hypoxia on 2 to 3 L of oxygen, ongoing smoker, coronary artery disease, factor V Leyden and history of pulmonary embolism on Coumadin.  She presented to the emergency room after a fall at home.  MRI revealed L3 compression fx.     PT Comments    Patient seen for mobility progression. Patient requiring cueing for safety and back precautions with PT reviewing with patient. Patient required seated rest break with mobility due to SOB with desat to 88-89% on 3L via [place]. PT to continue to follow.     Follow Up Recommendations  SNF     Equipment Recommendations  None recommended by PT    Recommendations for Other Services       Precautions / Restrictions Precautions Precautions: Fall;Back Precaution Comments: Pt educated on 3/3 back precautions. Required Braces or Orthoses: Spinal Brace Spinal Brace: Thoracolumbosacral orthotic;Applied in sitting position Restrictions Weight Bearing Restrictions: No    Mobility  Bed Mobility Overal bed mobility: Needs Assistance Bed Mobility: Rolling;Sidelying to Sit Rolling: Min guard Sidelying to sit: Min guard       General bed mobility comments: min guard for bed level mobility wiht light cueing to maintain log rolling - able to maintain without physical assist  Transfers Overall transfer level: Needs assistance Equipment used: Rolling walker (2 wheeled) Transfers: Sit to/from Stand Sit to Stand: Min guard         General transfer comment: min guard from bed level and recliner for general safety - cueing for hand placement with limited carryover  Ambulation/Gait Ambulation/Gait assistance: Min guard Gait Distance (Feet): 100 Feet(x2 - requires seated rest break due to SOB) Assistive device: Rolling walker (2  wheeled) Gait Pattern/deviations: Step-through pattern;Decreased stride length;Drifts right/left Gait velocity: varying throughout   General Gait Details: heavy reliance on UE support at RW; requires cueing for safety and obstacle navigation; SpO2 to 88% with mobility on 3L O2    Stairs             Wheelchair Mobility    Modified Rankin (Stroke Patients Only)       Balance Overall balance assessment: Needs assistance Sitting-balance support: No upper extremity supported;Feet supported Sitting balance-Leahy Scale: Good     Standing balance support: Bilateral upper extremity supported;During functional activity Standing balance-Leahy Scale: Poor Standing balance comment: reliant on RW, min guard for safety                             Cognition Arousal/Alertness: Awake/alert Behavior During Therapy: WFL for tasks assessed/performed Overall Cognitive Status: Within Functional Limits for tasks assessed                                 General Comments: patient very comical/joking throughout session      Exercises      General Comments General comments (skin integrity, edema, etc.): 3L O2 via [place]      Pertinent Vitals/Pain Pain Assessment: Faces Faces Pain Scale: Hurts a little bit Pain Location: back wiht initial mobility Pain Descriptors / Indicators: Grimacing;Guarding;Moaning Pain Intervention(s): Limited activity within patient's tolerance;Monitored during session;Repositioned    Home Living  Prior Function            PT Goals (current goals can now be found in the care plan section) Acute Rehab PT Goals Patient Stated Goal: decrease pain PT Goal Formulation: With patient Time For Goal Achievement: 01/28/18 Potential to Achieve Goals: Good Progress towards PT goals: Progressing toward goals    Frequency    Min 5X/week      PT Plan Current plan remains appropriate    Co-evaluation               AM-PAC PT "6 Clicks" Mobility   Outcome Measure  Help needed turning from your back to your side while in a flat bed without using bedrails?: A Little Help needed moving from lying on your back to sitting on the side of a flat bed without using bedrails?: A Little Help needed moving to and from a bed to a chair (including a wheelchair)?: A Little Help needed standing up from a chair using your arms (e.g., wheelchair or bedside chair)?: A Little Help needed to walk in hospital room?: A Little Help needed climbing 3-5 steps with a railing? : A Lot 6 Click Score: 17    End of Session Equipment Utilized During Treatment: Gait belt;Back brace;Oxygen Activity Tolerance: Patient tolerated treatment well Patient left: in chair;with chair alarm set;with call bell/phone within reach Nurse Communication: Mobility status PT Visit Diagnosis: Other abnormalities of gait and mobility (R26.89);Difficulty in walking, not elsewhere classified (R26.2);Pain;History of falling (Z91.81)     Time: 3716-9678 PT Time Calculation (min) (ACUTE ONLY): 25 min  Charges:  $Gait Training: 8-22 mins $Therapeutic Activity: 8-22 mins                      Lanney Gins, PT, DPT Supplemental Physical Therapist 01/23/18 4:20 PM Pager: [phone] Office: [phone]

## 2018-01-23 NOTE — Progress Notes (Signed)
ANTICOAGULATION CONSULT NOTE- Follow up  Pharmacy Consult for Coumadin Indication: h/o factor V leiden  def, h/o PE  Allergies  Allergen Reactions  . Dilaudid [Hydromorphone Hcl] Hives and Nausea Only  . Minocycline Hcl     REACTION: Dizzy  . Prednisone     REACTION: feels like throat swelling, hallucinations  . Varenicline Tartrate     REACTION: Dizzy(chantix)   . Zocor [Simvastatin - High Dose] Other (See Comments)    myalgia    Patient Measurements: Height: 5\' 4"  (162.6 cm) Weight: 164 lb 3.9 oz (74.5 kg) IBW/kg (Calculated) : 54.7  Vital Signs: Temp: 98.4 F (36.9 C) (01/15 0747) BP: 128/62 (01/15 0747) Pulse Rate: 74 (01/15 0747)  Labs: Recent Labs    01/21/18 0234 01/22/18 0218 01/23/18 0238  HGB 9.7* 9.7*  --   HCT 33.0* 31.6*  --   PLT 168 186  --   LABPROT 36.3* 30.7* 20.9*  INR 3.72 3.00 1.83  CREATININE 0.86 0.92  --     Estimated Creatinine Clearance: 58.6 mL/min (by C-G formula based on SCr of 0.92 mg/dL).  Assessment: 76 YOF who presented on 1/12 s/p fall and SOB. The patient was on warfarin PTA for hx factor V leiden and PE. Admit INR 2.67 on PTA dose of 6 mg/day EXCEPT for 5 mg on MWF. Pharmacy consulted to resume dosing this admission.  INR today is SUBtherapeutic (INR 1.83 << 3, goal of 2-3). No CBC today - no active bleeding noted.   Noted that the patient is also on solu-medrol which can increase the warfarin sensitivity.   Goal of Therapy:  INR 2-3 Monitor platelets by anticoagulation protocol: Yes   Plan:  - Repeat Warfarin 6 mg x 1 dose at 1800 today - Will continue to monitor for any signs/symptoms of bleeding and will follow up with PT/INR in the a.m.   Thank you for allowing pharmacy to be a part of this patient's care.  Alycia Rossetti, PharmD, BCPS Clinical Pharmacist Clinical phone for 01/23/2018: [phone] 01/23/2018 10:13 AM   **Pharmacist phone directory can now be found on [email or web address] ([email or web address]).  Listed under [place].

## 2018-01-24 ENCOUNTER — Non-Acute Institutional Stay (SKILLED_NURSING_FACILITY): Payer: Medicare Other | Admitting: Internal Medicine

## 2018-01-24 ENCOUNTER — Other Ambulatory Visit: Payer: Self-pay

## 2018-01-24 ENCOUNTER — Encounter: Payer: Self-pay | Admitting: Internal Medicine

## 2018-01-24 ENCOUNTER — Inpatient Hospital Stay
Admission: RE | Admit: 2018-01-24 | Discharge: 2018-01-28 | Disposition: A | Discharge status: Other Acute Inpatient Hospital | Payer: Medicare Other | Source: Ambulatory Visit | Attending: Internal Medicine | Admitting: Internal Medicine

## 2018-01-24 DIAGNOSIS — E785 Hyperlipidemia, unspecified: Secondary | ICD-10-CM | POA: Diagnosis present

## 2018-01-24 DIAGNOSIS — Z888 Allergy status to other drugs, medicaments and biological substances status: Secondary | ICD-10-CM | POA: Diagnosis not present

## 2018-01-24 DIAGNOSIS — Z9981 Dependence on supplemental oxygen: Secondary | ICD-10-CM | POA: Diagnosis not present

## 2018-01-24 DIAGNOSIS — M545 Low back pain: Secondary | ICD-10-CM

## 2018-01-24 DIAGNOSIS — Z7401 Bed confinement status: Secondary | ICD-10-CM | POA: Diagnosis not present

## 2018-01-24 DIAGNOSIS — J441 Chronic obstructive pulmonary disease with (acute) exacerbation: Secondary | ICD-10-CM | POA: Diagnosis present

## 2018-01-24 DIAGNOSIS — I2510 Atherosclerotic heart disease of native coronary artery without angina pectoris: Secondary | ICD-10-CM | POA: Diagnosis present

## 2018-01-24 DIAGNOSIS — Z7951 Long term (current) use of inhaled steroids: Secondary | ICD-10-CM | POA: Diagnosis not present

## 2018-01-24 DIAGNOSIS — Z7952 Long term (current) use of systemic steroids: Secondary | ICD-10-CM | POA: Diagnosis not present

## 2018-01-24 DIAGNOSIS — M6281 Muscle weakness (generalized): Secondary | ICD-10-CM | POA: Diagnosis not present

## 2018-01-24 DIAGNOSIS — D72829 Elevated white blood cell count, unspecified: Secondary | ICD-10-CM | POA: Diagnosis not present

## 2018-01-24 DIAGNOSIS — Z7901 Long term (current) use of anticoagulants: Secondary | ICD-10-CM | POA: Diagnosis not present

## 2018-01-24 DIAGNOSIS — J9611 Chronic respiratory failure with hypoxia: Secondary | ICD-10-CM | POA: Diagnosis not present

## 2018-01-24 DIAGNOSIS — Z87891 Personal history of nicotine dependence: Secondary | ICD-10-CM | POA: Diagnosis not present

## 2018-01-24 DIAGNOSIS — E872 Acidosis: Secondary | ICD-10-CM | POA: Diagnosis present

## 2018-01-24 DIAGNOSIS — G8929 Other chronic pain: Secondary | ICD-10-CM | POA: Diagnosis not present

## 2018-01-24 DIAGNOSIS — R0902 Hypoxemia: Secondary | ICD-10-CM | POA: Diagnosis not present

## 2018-01-24 DIAGNOSIS — Z9861 Coronary angioplasty status: Secondary | ICD-10-CM | POA: Diagnosis not present

## 2018-01-24 DIAGNOSIS — D6851 Activated protein C resistance: Secondary | ICD-10-CM | POA: Diagnosis present

## 2018-01-24 DIAGNOSIS — R2689 Other abnormalities of gait and mobility: Secondary | ICD-10-CM | POA: Diagnosis not present

## 2018-01-24 DIAGNOSIS — M2550 Pain in unspecified joint: Secondary | ICD-10-CM | POA: Diagnosis not present

## 2018-01-24 DIAGNOSIS — Z9181 History of falling: Secondary | ICD-10-CM | POA: Diagnosis not present

## 2018-01-24 DIAGNOSIS — D6859 Other primary thrombophilia: Secondary | ICD-10-CM | POA: Diagnosis present

## 2018-01-24 DIAGNOSIS — M8008XD Age-related osteoporosis with current pathological fracture, vertebra(e), subsequent encounter for fracture with routine healing: Secondary | ICD-10-CM | POA: Diagnosis not present

## 2018-01-24 DIAGNOSIS — E039 Hypothyroidism, unspecified: Secondary | ICD-10-CM | POA: Diagnosis present

## 2018-01-24 DIAGNOSIS — F17210 Nicotine dependence, cigarettes, uncomplicated: Secondary | ICD-10-CM | POA: Diagnosis present

## 2018-01-24 DIAGNOSIS — Z86711 Personal history of pulmonary embolism: Secondary | ICD-10-CM | POA: Diagnosis not present

## 2018-01-24 DIAGNOSIS — Z885 Allergy status to narcotic agent status: Secondary | ICD-10-CM | POA: Diagnosis not present

## 2018-01-24 DIAGNOSIS — F17200 Nicotine dependence, unspecified, uncomplicated: Secondary | ICD-10-CM | POA: Diagnosis not present

## 2018-01-24 DIAGNOSIS — M8008XA Age-related osteoporosis with current pathological fracture, vertebra(e), initial encounter for fracture: Secondary | ICD-10-CM | POA: Diagnosis present

## 2018-01-24 DIAGNOSIS — J9621 Acute and chronic respiratory failure with hypoxia: Secondary | ICD-10-CM | POA: Diagnosis present

## 2018-01-24 DIAGNOSIS — R739 Hyperglycemia, unspecified: Secondary | ICD-10-CM | POA: Diagnosis not present

## 2018-01-24 DIAGNOSIS — J9622 Acute and chronic respiratory failure with hypercapnia: Secondary | ICD-10-CM | POA: Diagnosis present

## 2018-01-24 DIAGNOSIS — Z720 Tobacco use: Secondary | ICD-10-CM | POA: Diagnosis not present

## 2018-01-24 DIAGNOSIS — J189 Pneumonia, unspecified organism: Secondary | ICD-10-CM | POA: Diagnosis not present

## 2018-01-24 DIAGNOSIS — R000 Tachycardia, unspecified: Secondary | ICD-10-CM | POA: Diagnosis not present

## 2018-01-24 DIAGNOSIS — Z79899 Other long term (current) drug therapy: Secondary | ICD-10-CM | POA: Diagnosis not present

## 2018-01-24 DIAGNOSIS — S32039D Unspecified fracture of third lumbar vertebra, subsequent encounter for fracture with routine healing: Secondary | ICD-10-CM | POA: Diagnosis not present

## 2018-01-24 DIAGNOSIS — D6852 Prothrombin gene mutation: Secondary | ICD-10-CM | POA: Diagnosis present

## 2018-01-24 DIAGNOSIS — W19XXXA Unspecified fall, initial encounter: Secondary | ICD-10-CM | POA: Diagnosis present

## 2018-01-24 DIAGNOSIS — I2699 Other pulmonary embolism without acute cor pulmonale: Secondary | ICD-10-CM | POA: Diagnosis not present

## 2018-01-24 DIAGNOSIS — R0602 Shortness of breath: Secondary | ICD-10-CM | POA: Diagnosis not present

## 2018-01-24 DIAGNOSIS — L8990 Pressure ulcer of unspecified site, unspecified stage: Secondary | ICD-10-CM | POA: Diagnosis present

## 2018-01-24 LAB — CBC
HCT: 31 % — ABNORMAL LOW (ref 36.0–46.0)
Hemoglobin: 9.8 g/dL — ABNORMAL LOW (ref 12.0–15.0)
MCH: 29.9 pg (ref 26.0–34.0)
MCHC: 31.6 g/dL (ref 30.0–36.0)
MCV: 94.5 fL (ref 80.0–100.0)
Platelets: 216 10*3/uL (ref 150–400)
RBC: 3.28 MIL/uL — ABNORMAL LOW (ref 3.87–5.11)
RDW: 16.5 % — ABNORMAL HIGH (ref 11.5–15.5)
WBC: 15.4 10*3/uL — ABNORMAL HIGH (ref 4.0–10.5)
nRBC: 0.1 % (ref 0.0–0.2)

## 2018-01-24 LAB — BASIC METABOLIC PANEL
Anion gap: 7 (ref 5–15)
BUN: 17 mg/dL (ref 8–23)
CO2: 34 mmol/L — ABNORMAL HIGH (ref 22–32)
Calcium: 8 mg/dL — ABNORMAL LOW (ref 8.9–10.3)
Chloride: 97 mmol/L — ABNORMAL LOW (ref 98–111)
Creatinine, Ser: 0.82 mg/dL (ref 0.44–1.00)
GFR calc Af Amer: >60 mL/min (ref >60)
GLUCOSE: 137 mg/dL — AB (ref 70–99)
Potassium: 3.7 mmol/L (ref 3.5–5.1)
Sodium: 138 mmol/L (ref 135–145)

## 2018-01-24 LAB — PROTIME-INR
INR: 2.26
Prothrombin Time: 24.7 seconds — ABNORMAL HIGH (ref 11.4–15.2)

## 2018-01-24 LAB — GLUCOSE, CAPILLARY
Glucose-Capillary: 116 mg/dL — ABNORMAL HIGH (ref 70–99)
Glucose-Capillary: 97 mg/dL (ref 70–99)

## 2018-01-24 LAB — MAGNESIUM: Magnesium: 1.8 mg/dL (ref 1.7–2.4)

## 2018-01-24 MED ORDER — BACLOFEN 10 MG PO TABS: 10.0000 mg | ORAL_TABLET | Freq: Three times a day (TID) | ORAL | 0 refills | Status: DC

## 2018-01-24 MED ORDER — FENTANYL 50 MCG/HR TD PT72: 1.0000 | MEDICATED_PATCH | TRANSDERMAL | 0 refills | Status: DC

## 2018-01-24 MED ORDER — HYDROCODONE-ACETAMINOPHEN 5-325 MG PO TABS: 1.0000 | ORAL_TABLET | Freq: Three times a day (TID) | ORAL | 0 refills | Status: DC | PRN

## 2018-01-24 MED ORDER — LIDOCAINE 5 % EX PTCH: 1.0000 | MEDICATED_PATCH | CUTANEOUS | 0 refills | Status: DC

## 2018-01-24 MED ORDER — NICOTINE 14 MG/24HR TD PT24: 14.0000 mg | MEDICATED_PATCH | Freq: Every day | TRANSDERMAL | 0 refills | Status: DC

## 2018-01-24 MED ORDER — POLYETHYLENE GLYCOL 3350 17 G PO PACK: 17.0000 g | PACK | Freq: Every day | ORAL | 0 refills | Status: DC

## 2018-01-24 MED ORDER — METHYLPREDNISOLONE 32 MG PO TABS
32.0000 mg | ORAL_TABLET | Freq: Every day | ORAL | Status: DC
  Administered 2018-01-24: 32 mg via ORAL
  Filled 2018-01-24: qty 1

## 2018-01-24 MED ORDER — METHYLPREDNISOLONE 4 MG PO TBPK: ORAL_TABLET | ORAL | 0 refills | Status: DC

## 2018-01-24 MED ORDER — SENNOSIDES-DOCUSATE SODIUM 8.6-50 MG PO TABS: 2.0000 | ORAL_TABLET | Freq: Two times a day (BID) | ORAL | 0 refills | Status: DC

## 2018-01-24 MED ORDER — WARFARIN SODIUM 3 MG PO TABS: 6.0000 mg | ORAL_TABLET | Freq: Once | ORAL | Status: DC

## 2018-01-24 MED FILL — Fentanyl TD Patch 72HR 50 MCG/HR: TRANSDERMAL | Qty: 1 | Status: AC

## 2018-01-24 NOTE — Progress Notes (Signed)
Occupational Therapy Treatment Patient Details Name: [name] MRN: [id number] DOB: 10-15-1950 Today's Date: 01/24/2018    History of present illness 68 y.o. female with medical history significant of COPD, chronic hypoxia on 2 to 3 L of oxygen, ongoing smoker, coronary artery disease, factor V Leyden and history of pulmonary embolism on Coumadin.  She presented to the emergency room after a fall at home.  MRI revealed L3 compression fx.    OT comments  Pt progressing towards OT goals. Educated pt on use of AE for LB ADL, pt requires cues for technique and for correct return demonstration. Pt completing room level mobility, toileting, and standing grooming ADL with overall close minguard assist. Pt continues to require safety cues throughout mobility and functional tasks. Pt on 3L this session with lowest SpO2 noted 86% after room level activity, rebounding to >92% within approx 1 min and seated rest. Feel SNF recommendation remains appropriate at this time. Will continue to follow acutely.    Follow Up Recommendations  SNF;Supervision/Assistance - 24 hour    Equipment Recommendations  Other (comment)(defer to next venue)          Precautions / Restrictions Precautions Precautions: Fall;Back Precaution Booklet Issued: Yes (comment) Precaution Comments: Pt educated on 3/3 back precautions. Required Braces or Orthoses: Spinal Brace Spinal Brace: Thoracolumbosacral orthotic;Applied in sitting position Restrictions Weight Bearing Restrictions: No       Mobility Bed Mobility Overal bed mobility: Needs Assistance Bed Mobility: Rolling;Sit to Sidelying Rolling: Supervision Sidelying to sit: Supervision       General bed mobility comments: educated and re-emphasized log roll technique however despite continued cues pt only partially completing when returning to supine, despite lack of use of log roll technique pt able to maintain body alignment with minimal  twisting  Transfers Overall transfer level: Needs assistance Equipment used: Rolling walker (2 wheeled) Transfers: Sit to/from Stand Sit to Stand: Min guard         General transfer comment: min guard for safety, VCs hand placement    Balance Overall balance assessment: Needs assistance Sitting-balance support: No upper extremity supported;Feet supported Sitting balance-Leahy Scale: Good     Standing balance support: Bilateral upper extremity supported;During functional activity Standing balance-Leahy Scale: Poor Standing balance comment: reliant on RW, min guard for safety                            ADL either performed or assessed with clinical judgement   ADL Overall ADL's : Needs assistance/impaired     Grooming: Wash/dry hands;Min guard;Standing           Upper Body Dressing : Min guard;Sitting Upper Body Dressing Details (indicate cue type and reason): pt doffing TLSO with min cues for technique while seated EOB Lower Body Dressing: Minimal assistance;Sit to/from stand;With adaptive equipment Lower Body Dressing Details (indicate cue type and reason): educated on use of reacher and sock aide for completing LB ADL; pt somewhat return demonstrating, also stating she typically uses reacher for this task (despite cues doesn't fully demosntrate at this time, simply states she knows how); with cues pt return demonstrates use of sock aide ; pt demonstrates figure 4 technique as well and educated pt that this is also an Set designer: Min guard;Ambulation;RW;Grab bars Toilet Transfer Details (indicate cue type and reason): educated to use grab bar to control descent and pt continues to opt to maintain both UEs on RW Toileting- Water quality scientist and Hygiene: Min  guard;Sit to/from stand Toileting - Clothing Manipulation Details (indicate cue type and reason): pt performing peri-care after voiding bladder while seated, maintains back precautions while  doing so, performs gown management in standing with minguard for safety      Functional mobility during ADLs: Min guard;Rolling walker       Vision       Perception     Praxis      Cognition Arousal/Alertness: Awake/alert Behavior During Therapy: WFL for tasks assessed/performed Overall Cognitive Status: Within Functional Limits for tasks assessed                                          Exercises     Shoulder Instructions       General Comments 3L during session with lowest O2 noted 86% after room level activity, rebounds to >92% within approx 1 min and seated rest    Pertinent Vitals/ Pain       Pain Assessment: Faces Faces Pain Scale: Hurts even more Pain Location: back wiht initial mobility Pain Descriptors / Indicators: Grimacing;Guarding;Moaning Pain Intervention(s): Limited activity within patient's tolerance;Monitored during session;Repositioned  Home Living                                          Prior Functioning/Environment              Frequency  Min 2X/week        Progress Toward Goals  OT Goals(current goals can now be found in the care plan section)  Progress towards OT goals: Progressing toward goals  Acute Rehab OT Goals Patient Stated Goal: decrease pain OT Goal Formulation: With patient Time For Goal Achievement: 02/04/18 Potential to Achieve Goals: Good  Plan Discharge plan remains appropriate    Co-evaluation                 AM-PAC OT "6 Clicks" Daily Activity     Outcome Measure   Help from another person eating meals?: None Help from another person taking care of personal grooming?: A Little Help from another person toileting, which includes using toliet, bedpan, or urinal?: A Little Help from another person bathing (including washing, rinsing, drying)?: A Lot Help from another person to put on and taking off regular upper body clothing?: A Little Help from another person to  put on and taking off regular lower body clothing?: A Lot 6 Click Score: 17    End of Session Equipment Utilized During Treatment: Gait belt;Rolling walker;Back brace  OT Visit Diagnosis: Other abnormalities of gait and mobility (R26.89);History of falling (Z91.81);Pain Pain - part of body: (back)   Activity Tolerance Patient tolerated treatment well   Patient Left in bed;with call bell/phone within reach;with bed alarm set   Nurse Communication Mobility status        Time: 3716-9678 OT Time Calculation (min): 24 min  Charges: OT General Charges $OT Visit: 1 Visit OT Treatments $Self Care/Home Management : 23-37 mins  Lou Cal, OT Supplemental Rehabilitation Services Pager [phone] Office [phone]   Raymondo Band 01/24/2018, 2:25 PM

## 2018-01-24 NOTE — Telephone Encounter (Signed)
RX Fax for [facility]@ [phone]  

## 2018-01-24 NOTE — Care Management Important Message (Signed)
Important Message  Patient Details  Name: [name] MRN: [id number] Date of Birth: [birth year]   Medicare Important Message Given:  Yes    Orbie Pyo 01/24/2018, 2:23 PM

## 2018-01-24 NOTE — Discharge Summary (Signed)
[facility] Discharge Summary   Patient: [name] TTS:[id number]   PCP: [name], NP DOB: 1950/10/25   Date of admission: 01/19/2018   Date of discharge:  01/24/2018    Discharge Diagnoses:  Principal Problem:   COPD exacerbation ([place]) Active Problems:   Hypothyroidism   TOBACCO ABUSE   History of pulmonary embolus (PE)   COPD (chronic obstructive pulmonary disease) with emphysema ([place])   Factor V Leiden, prothrombin gene mutation ([place])   Hyperlipidemia   Chronic respiratory failure ([place])   Vertebral fracture, osteoporotic ([place])   Hyperglycemia   COPD with exacerbation ([place])  Admitted From: home Disposition:  SNF  Recommendations for Outpatient Follow-up:  1. Please follow-up with PCP in 1 week as well as neurosurgery as recommended.   Contact information for follow-up providers    Meyran, Ocie Cornfield, NP. Schedule an appointment as soon as possible for a visit in 1 week.   Specialty:  Neurosurgery Why:  for compression fracture, ambulate with brace and tolerated Contact information: [address] [place] [address] [phone]        [name], NP. Schedule an appointment as soon as possible for a visit in 2 days.   Specialty:  Family Medicine Contact information: [place] [address] [phone]        [place].   Specialty:  Emergency Medicine Why:  If symptoms worsen, As needed Contact information: [address] [id number] [place]           Contact information for after-discharge care    [facility] .   Service:  Skilled Nursing Contact information: [address] [place] [phone]                 Diet recommendation: Cardiac diet  Activity: The patient is advised to gradually reintroduce usual  activities.  Discharge Condition: good  Code Status: Full code  History of present illness: As per the H and P dictated on admission, "[name] is a 68 y.o. female with medical history significant of COPD, chronic hypoxia on 2 to 3 L of oxygen, ongoing smoker, coronary artery disease, factor V [place] and history of pulmonary embolism on Coumadin who went to [facility] emergency room with fall at home.  Patient is poor historian.  For anything you ask she says going on for a while.  As per patient, she fell on the ground at home last night when she was trying to get up quick to go the bathroom.  She started hurting her back.  She complained of back pain, lower back, dull ache, about 5 out of 10, no radiation, pain aggravated by ambulation and not relieved by her fentanyl at home.  Patient also complains of ongoing shortness of breath that is recently getting worse, she has wheezing.  She continues to smoke.  No fever or chills.  No flulike symptoms.  As stated, patient is poor historian, she says she always has wheezing and this is nothing new.  Patient however denies any other injuries.  Denies any chest pain.  Denies any abdominal pain.  There was no urinary or bowel incontinence.  Patient was suspected to have vertebral fracture, she was transferred to [facility] emergency room for MRI of the spine.  ED Course: Patient was transferred to [facility], ER for MRI of the spine.  She was found to  have L3 compression fracture with 30% loss of height, no neurological deficits.  Seen by spinal surgery and suggested TLSO brace and ambulation as well pain relief.  No surgical intervention was planned.  However, while patient is in the ER, ER physician noted that she is obviously wheezing, she is poor historian and they thought she has COPD exacerbation.  As patient has ongoing pain, difficulty breathing due to pain and COPD, needing medical monitoring and treatment.  Patient is slightly hypertensive in the  ER.  Her INR is therapeutic.  She does not have any evidence of active bleeding. "  Hospital Course:  Summary of her active problems in the hospital is as following.  COPD with exacerbation: -Improving, cutdown steroids -Incentive spirometer, flutter valve, duo nebs -O2 down to 3 L home regimen   Traumatic and osteoporotic L3 fracture: UnControlled pain -Neurologically stable. Seen by spinal surgerywith no surgery required. -Continue fentanyl patch, added lidocaine patch, Vicodin as needed and muscle relaxer, increase the dose of muscle relaxant. -Physical therapy following, SNF recommended  Hyperglycemia: steroid induced most likely -Stable,   History of VTE and factor V Leidenmutation  -Continue Coumadin, INR Therapeutic.  Monitor.  May require bridge.  Hypothyroidism:  -On Synthroid  Smoker:Counseled to quit.  -nicotine patch.  Constipation. Increase bowel regimen.  Debility weakness, fall risk -PT OT recommended SNF, social work and case management following  Pain control  - [employer] Controlled Substance Reporting System database was reviewed. - Last prescription for controlled substance was on 12/29/2017 for 1 month supply of duragesic. - 1 day supply was provided. - Patient was instructed, not to drive, operate heavy machinery, perform activities at heights, swimming or participation in water activities or provide baby sitting services while on Pain, Sleep and Anxiety Medications; until her outpatient Physician has advised to do so again.  - Also recommended to not to take more than prescribed Pain, Sleep and Anxiety Medications.  Patient was seen by physical therapy, who recommended SNF, which was arranged by Education officer, museum. On the day of the discharge the patient's vitals were stable , and no other acute medical condition were reported by patient. the patient was felt safe to be discharge at SNF with therapy.  Consultants: none Procedures:  none  DISCHARGE MEDICATION: Allergies as of 01/24/2018      Reactions   Dilaudid [hydromorphone Hcl] Hives, Nausea Only   Minocycline Hcl    REACTION: Dizzy   Prednisone    REACTION: feels like throat swelling, hallucinations   Varenicline Tartrate    REACTION: Dizzy(chantix)   Zocor [simvastatin - High Dose] Other (See Comments)   myalgia      Medication List    TAKE these medications   albuterol (2.5 MG/3ML) 0.083% nebulizer solution Commonly known as:  PROVENTIL USE 1 VIAL BY NEBULIZER EVERY 4 HOURS AS NEEDED FOR WHEEZING. DX: J44.9 What changed:    how much to take  how to take this  when to take this  reasons to take this  additional instructions   arformoterol 15 MCG/2ML Nebu Commonly known as:  BROVANA Take 2 mLs (15 mcg total) by nebulization 2 (two) times daily.   baclofen 10 MG tablet Commonly known as:  LIORESAL Take 1 tablet (10 mg total) by mouth 3 (three) times daily.   benzonatate 100 MG capsule Commonly known as:  TESSALON Take 1 capsule (100 mg total) by mouth 2 (two) times daily as needed for cough.   budesonide 0.5 MG/2ML nebulizer solution Commonly known  as:  PULMICORT Take 2 mLs (0.5 mg total) by nebulization 2 (two) times daily. Dx: J43.9   fentaNYL 50 MCG/HR Commonly known as:  [place] 1 patch onto the skin every 3 (three) days. What changed:  how much to take   ferrous sulfate 325 (65 FE) MG tablet Take 325 mg by mouth daily with breakfast.   Fish Oil 600 MG Caps Take 1 capsule by mouth 3 (three) times daily.   FLUTTER Devi Use as directed   HYDROcodone-acetaminophen 5-325 MG tablet Commonly known as:  NORCO/VICODIN Take 1 tablet by mouth every 8 (eight) hours as needed for up to 5 days for moderate pain.   ipratropium-albuterol 0.5-2.5 (3) MG/3ML Soln Commonly known as:  DUONEB Take 3 mLs by nebulization 4 (four) times daily.   levothyroxine 125 MCG tablet Commonly known as:  SYNTHROID, LEVOTHROID Take 1 tablet  (125 mcg total) by mouth daily.   lidocaine 5 % Commonly known as:  LIDODERM Place 1 patch onto the skin daily. Remove & Discard patch within 12 hours or as directed by MD   methylPREDNISolone 4 MG Tbpk tablet Commonly known as:  MEDROL DOSEPAK Day 1: 8 mg PO before breakfast, 4 mg after lunch and after dinner, and 8 mg at bedtime Day 2: 4 mg PO before breakfast, after lunch, and after dinner and 8 mg at bedtime Day 3: 4 mg PO before breakfast, after lunch, after dinner, and at bedtime Day 4: 4 mg PO before breakfast, after lunch, and at bedtime Day 5: 4 mg PO before breakfast and at bedtime Day 6: 4 mg PO before breakfast   nicotine 14 mg/24hr patch Commonly known as:  NICODERM CQ - dosed in mg/24 hours Place 1 patch (14 mg total) onto the skin daily. Start taking on:  January 25, 2018   nitroGLYCERIN 0.4 MG SL tablet Commonly known as:  NITROSTAT Place 1 tablet (0.4 mg total) under the tongue every 5 (five) minutes as needed for chest pain.   OXYGEN Inhale 3 L into the lungs continuous.   polyethylene glycol packet Commonly known as:  MIRALAX / GLYCOLAX Take 17 g by mouth daily. Start taking on:  January 25, 2018   senna-docusate 8.6-50 MG tablet Commonly known as:  Senokot-S Take 2 tablets by mouth 2 (two) times daily.   ticagrelor 60 MG Tabs tablet Commonly known as:  BRILINTA TAKE 1 TABLET(60 MG) BY MOUTH TWICE DAILY What changed:    how much to take  how to take this  when to take this  additional instructions   traZODone 50 MG tablet Commonly known as:  DESYREL Take 50 mg by mouth at bedtime as needed for sleep.   warfarin 5 MG tablet Commonly known as:  COUMADIN Take as directed. If you are unsure how to take this medication, talk to your nurse or doctor. Original instructions:  Take 5 mg by mouth daily.   warfarin 1 MG tablet Commonly known as:  COUMADIN Take as directed. If you are unsure how to take this medication, talk to your nurse or  doctor. Original instructions:  Take 1 tablet (1mg ) on Sunday, Tuesday, Thursday and Saturday along with a 5mg  tablet or as directed      Allergies  Allergen Reactions  . Dilaudid [Hydromorphone Hcl] Hives and Nausea Only  . Minocycline Hcl     REACTION: Dizzy  . Prednisone     REACTION: feels like throat swelling, hallucinations  . Varenicline Tartrate     REACTION:  Dizzy(chantix)   . Zocor [Simvastatin - High Dose] Other (See Comments)    myalgia   Discharge Instructions    Diet - low sodium heart healthy   Complete by:  As directed    Discharge instructions   Complete by:  As directed    It is important that you read the given instructions as well as go over your medication list with RN to help you understand your care after this hospitalization.  Discharge Instructions: Please follow-up with PCP in 1-2 weeks  Please request your primary care physician to go over all Hospital Tests and Procedure/Radiological results at the follow up. Please get all Hospital records sent to your PCP by signing hospital release before you go home.   Do not drive, operating heavy machinery, perform activities at heights, swimming or participation in water activities or provide baby sitting services while you are on Pain, Sleep and Anxiety Medications; until you have been seen by Primary Care Physician or a Neurologist and advised to do so again. Do not take more than prescribed Pain, Sleep and Anxiety Medications. You were cared for by a hospitalist during your hospital stay. If you have any questions about your discharge medications or the care you received while you were in the hospital after you are discharged, you can call the unit @UNIT @ you were admitted to and ask to speak with the hospitalist on call if the hospitalist that took care of you is not available.  Once you are discharged, your primary care physician will handle any further medical issues. Please note that NO REFILLS for any  discharge medications will be authorized once you are discharged, as it is imperative that you return to your primary care physician (or establish a relationship with a primary care physician if you do not have one) for your aftercare needs so that they can reassess your need for medications and monitor your lab values. You Must read complete instructions/literature along with all the possible adverse reactions/side effects for all the Medicines you take and that have been prescribed to you. Take any new Medicines after you have completely understood and accept all the possible adverse reactions/side effects. Wear Seat belts while driving. If you have smoked or chewed Tobacco in the last 2 yrs please stop smoking and/or stop any Recreational drug use.  If you drink alcohol, please moderate the use and do not drive, operating heavy machinery, perform activities at heights, swimming or participation in water activities or provide baby sitting services under influence.   Increase activity slowly   Complete by:  As directed      Discharge Exam: Filed Weights   01/19/18 1935 01/21/18 0500 01/23/18 0506  Weight: 68 kg 72.8 kg 74.5 kg   Vitals:   01/24/18 0851 01/24/18 0857  BP:    Pulse:    Resp:    Temp:    SpO2: 95% 95%   General: Appear in mild distress, no Rash; Oral Mucosa moist. Cardiovascular: S1 and S2 Present, no Murmur, no JVD Respiratory: Bilateral Air entry present and no Crackles, bilateral  wheezes Abdomen: Bowel Sound present, Soft and no tenderness Extremities: no Pedal edema, no calf tenderness Neurology: Grossly no focal neuro deficit.  The results of significant diagnostics from this hospitalization (including imaging, microbiology, ancillary and laboratory) are listed below for reference.    Significant Diagnostic Studies: Dg Lumbar Spine Complete  Result Date: 01/19/2018 CLINICAL DATA:  [place] tonight with pop in the low back. Low back pain. EXAM: LUMBAR  SPINE -  COMPLETE 4+ VIEW COMPARISON:  None. FINDINGS: Examination is technically limited due to multiple superimposed leads and positioning. Normal alignment of the lumbar spine. Normal alignment of the lumbar vertebrae. No vertebral compression deformities. No focal bone lesion or bone destruction. Mild disc space narrowing and endplate hypertrophic changes consistent with mild degenerative change. Visualized sacrum appears intact. IMPRESSION: Mild degenerative changes in the lumbar spine. Normal alignment. No acute displaced fractures identified. Electronically Signed   By: Lucienne Capers M.D.   On: 01/19/2018 22:26   Dg Pelvis 1-2 Views  Result Date: 01/19/2018 CLINICAL DATA:  Patient fell tonight, feeling a pop in the low back. Now low back pain. EXAM: PELVIS - 1-2 VIEW COMPARISON:  None. FINDINGS: There is no evidence of pelvic fracture or diastasis. No pelvic bone lesions are seen. IMPRESSION: Negative. Electronically Signed   By: Lucienne Capers M.D.   On: 01/19/2018 22:24   Mr Lumbar Spine Wo Contrast  Result Date: 01/20/2018 CLINICAL DATA:  Fall yesterday.  Pain. EXAM: MRI LUMBAR SPINE WITHOUT CONTRAST TECHNIQUE: Multiplanar, multisequence MR imaging of the lumbar spine was performed. No intravenous contrast was administered. COMPARISON:  Lumbar spine radiographs 01/19/2018 FINDINGS: Segmentation: 5 non rib-bearing lumbar type vertebral bodies are present. The lowest fully formed vertebral body is L5. Alignment:  AP alignment is anatomic. Vertebrae: Remote superior endplate fractures are present at L1 and L5. A superior endplate fracture of L3 on the right demonstrates marrow signal change battle with acute/subacute fracture. There is 30% loss of height anteriorly. Stir sequences demonstrate edema extending into the right pedicle. There is no retropulsed bone. Conus medullaris and cauda equina: Conus extends to the L1-2 level. Conus and cauda equina appear normal. Paraspinal and other soft tissues:  Limited imaging the abdomen is unremarkable. There is no significant adenopathy. No solid organ lesions are present. Disc levels: L1-2: Negative. L2-3: Mild disc bulging is present without significant stenosis. L3-4: Mild disc bulging is present. There is no significant stenosis. L4-5: A broad-based disc protrusion is present. Mild facet hypertrophy is present bilaterally. Mild subarticular and foraminal narrowing is present bilaterally. L5-S1: A shallow central disc protrusion is present. The central canal is patent. Foramina are patent bilaterally. IMPRESSION: 1. Acute/subacute superior endplate fracture of L3 on the right with approximately 30% loss of height. No retropulsed bone or associated stenosis. 2. Remote superior endplate fractures at L1 and L5. 3. Broad-based disc protrusion at L4-5 with mild subarticular and foraminal stenosis bilaterally. Electronically Signed   By: San Morelle M.D.   On: 01/20/2018 06:51   Dg Chest Portable 1 View  Result Date: 01/20/2018 CLINICAL DATA:  Pain. EXAM: PORTABLE CHEST 1 VIEW COMPARISON:  09/19/2017 FINDINGS: 1122 hours. The cardio pericardial silhouette is enlarged. There is pulmonary vascular congestion without overt pulmonary edema. Interstitial markings are diffusely coarsened with chronic features. Hazy opacity in the right suprahilar lung may be related to positioning. The visualized bony structures of the thorax are intact. IMPRESSION: Hazy opacity suprahilar right lung may be related to positioning. Dedicated upright PA film recommended when patient is able, to further evaluate. Electronically Signed   By: Misty Stanley M.D.   On: 01/20/2018 13:47    Microbiology: No results found for this or any previous visit (from the past 240 hour(s)).   Labs: CBC: Recent Labs  Lab 01/19/18 2121 01/21/18 0234 01/22/18 0218 01/24/18 0233  WBC 9.3 7.5 11.3* 15.4*  HGB 11.1* 9.7* 9.7* 9.8*  HCT 38.6 33.0* 31.6* 31.0*  MCV 100.3*  96.5 94.6 94.5  PLT  211 168 186 767   Basic Metabolic Panel: Recent Labs  Lab 01/19/18 2121 01/21/18 0234 01/22/18 0218 01/24/18 0233  NA 140 138 137 138  K 3.8 4.1 4.8 3.7  CL 96* 97* 99 97*  CO2 37* 36* 30 34*  GLUCOSE 160* 153* 131* 137*  BUN 21 18 19 17   CREATININE 0.89 0.86 0.92 0.82  CALCIUM 8.8* 8.4* 8.3* 8.0*  MG  --   --   --  1.8   Liver Function Tests: No results for input(s): AST, ALT, ALKPHOS, BILITOT, PROT, ALBUMIN in the last 168 hours. No results for input(s): LIPASE, AMYLASE in the last 168 hours. No results for input(s): AMMONIA in the last 168 hours. Cardiac Enzymes: Recent Labs  Lab 01/19/18 2121  TROPONINI <0.03   BNP (last 3 results) Recent Labs    02/19/17 1540  BNP 121.0*   CBG: Recent Labs  Lab 01/23/18 1129 01/23/18 1823 01/23/18 2133 01/24/18 0738 01/24/18 1205  GLUCAP 144* 157* 95 116* 97   Time spent: 35 minutes  Signed:  Berle Mull  [facility]  01/24/2018

## 2018-01-24 NOTE — Clinical Social Work Placement (Signed)
Nurse to call report to [place]  NOTE  Date:  01/24/2018  Patient Details  Name: [name] MRN: [id number] Date of Birth: 1950/11/08  Clinical Social Work is seeking post-discharge placement for this patient at the [place] level of care (*CSW will initial, date and re-position this form in  chart as items are completed):  Yes   Patient/family provided with [facility] Work Department's list of facilities offering this level of care within the geographic area requested by the patient (or if unable, by the patient's family).  Yes   Patient/family informed of their freedom to choose among providers that offer the needed level of care, that participate in Medicare, Medicaid or managed care program needed by the patient, have an available bed and are willing to accept the patient.  Yes   Patient/family informed of [facility]'s ownership interest in [facility] and [facility], as well as of the fact that they are under no obligation to receive care at these facilities.  PASRR submitted to EDS on       PASRR number received on       Existing PASRR number confirmed on       FL2 transmitted to all facilities in geographic area requested by pt/family on       FL2 transmitted to all facilities within larger geographic area on       Patient informed that his/her managed care company has contracts with or will negotiate with certain facilities, including the following:        Yes   Patient/family informed of bed offers received.  Patient chooses bed at [facility]     Physician recommends and patient chooses bed at      Patient to be transferred to [facility] on 01/24/18.  Patient to be transferred to facility by [facility]     Patient family notified on 01/24/18 of transfer.  Name of family member notified:  Self, daughter     PHYSICIAN       Additional Comment:     _______________________________________________ Geralynn Ochs, LCSW 01/24/2018, 1:58 PM

## 2018-01-24 NOTE — Progress Notes (Signed)
This is an acute visit.  Level of care skilled.  Facility is [facility].  Chief complaint acute visit status post hospitalization for COPD exacerbation- well as new L3 compression fracture  History of present illness.  Patient is a 68 year old female with a history of COPD with chronic hypoxia on chronic oxygen-ongoing smoker-coronary artery disease as well as history of factor IV Leiden with history of pulmonary embolism on chronic Coumadin.  She went to the ER at [facility] after a fall at home.  She apparently stated she hurt her back.  She has chronic Duragesic patch but apparently pain was still was not relieved.  She also complained of shortness of breath getting worse with wheezing.  Patient was suspected to have a vertebral fracture and was transferred to [facility] emergency room for MRI of the spine.  She was found to have an L3 compression fracture with 30% loss of height with no neurologic deficits.  Spinal surgery suggested a TLSO brace and ambulation as well as pain relief. No surgical intervention was recommended.  In the ER however she was noted to be wheezing and it was thought she had a COPD exacerbation secondary to her pain as well as breathing difficulties she was admitted for medical monitoring and treatment.  For COPD this apparently improved with treatment and steroids were tapered- she continues on duo nebs as well as chronic oxygen as well as Brovana and Pulmicort-she is still receiving steroids.  In regards to her L3 compression fracture with significant pain again no surgery was recommended recommendation to continue Duragesic patch a lidocaine patch was added Vicodin as needed in addition to baclofen 3 times daily.   She apparently did have mild hyperglycemia this was thought to be secondary to steroids.  Regards to her history of VTE and factor f4 Leiden mutation-INR was in therapeutic range at discharge she is on Coumadin will update an INR  tomorrow.  In regards to hypothyroidism she continues on Synthroid.  She also did receive a nicotine patch for her history of smoking.  She is complaining constipation and appears MiraLAX was added to her Senokot upon discharge.  She is here for PT OT and strengthening with hopes to return back home.  Currently she is eating supper does not appear to be uncomfortable- vital signs that I been able to assess appear stable.  Past Medical History:  Diagnosis Date  . Acute MI ([place])    x4, code blue x3  . Arteriosclerotic cardiovascular disease (ASCVD) 2002   Inf STEMI-2002. 2003-cutting balloon + brachytherapy for restenosis; subsequent acute stent thrombosis 06/2010 requiring 2 separate interventions (Zeta stent, then repeat cath with thrombectomy). focal basal inf AK, nl EF; 03/2011: Patent stents, minor nonobst  residual dz, nl EF; neg stress nuclear in 2008 and stress echo in 2009  . Chronic anticoagulation    Warfarin plus ticagrelor  . Chronic respiratory failure ([place]) 08/27/2013   On 2L 02  . COPD (chronic obstructive pulmonary disease) ([place])    02 dependent  . COPD (chronic obstructive pulmonary disease) ([place])   . Factor 5 Leiden mutation, heterozygous ([place])   . Factor V Leiden, prothrombin gene mutation ([place]) 2006  . Hyperlipidemia   . Hypothyroidism   . Noncompliance   . Pelvic fracture ([place]) 2009  . Pulmonary embolism ([place]) 2006   Associated with deep vein thrombosis-2006; + factor V Leiden  . Tobacco abuse    50 pack years         Past  Surgical History:  Procedure Laterality Date  . COLONOSCOPY  Approximately 2000   Negative screening study  . CORONARY ANGIOPLASTY  2002, 2003, 2012  . LEFT AND RIGHT HEART CATHETERIZATION WITH CORONARY ANGIOGRAM N/A 04/03/2011   Procedure: LEFT AND RIGHT HEART CATHETERIZATION WITH CORONARY ANGIOGRAM;  Surgeon: Sherren Mocha, MD;  Location: [facility];  Service: Cardiovascular;  Laterality: N/A;     reports  that she has been smoking cigarettes. She started smoking about 49 years ago. She has a 25.00 pack-year smoking history. She has never used smokeless tobacco. She reports that she does not drink alcohol or use drugs.       Allergies  Allergen Reactions  . Dilaudid [Hydromorphone Hcl] Hives and Nausea Only  . Minocycline Hcl     REACTION: Dizzy  . Prednisone     REACTION: feels like throat swelling, hallucinations  . Varenicline Tartrate     REACTION: Dizzy(chantix)   . Zocor [Simvastatin - High Dose] Other (See Comments)    myalgia         Family History  Problem Relation Age of Onset  . Emphysema Mother   . Alzheimer's disease Mother   . Emphysema Sister   . Heart disease Father   . Factor V Leiden deficiency Father   . Factor V Leiden deficiency Sister   . Factor V Leiden deficiency Daughter   . Kidney cancer Paternal Grandmother     Medication List       albuterol (2.5 MG/3ML) 0.083% nebulizer solution Commonly known as:  PROVENTIL USE 1 VIAL BY NEBULIZER EVERY 4 HOURS AS NEEDED FOR WHEEZING. DX: J44.9 What changed:    how much to take  how to take this  when to take this  reasons to take this  additional instructions   arformoterol 15 MCG/2ML Nebu Commonly known as:  BROVANA Take 2 mLs (15 mcg total) by nebulization 2 (two) times daily.   baclofen 10 MG tablet Commonly known as:  LIORESAL Take 1 tablet (10 mg total) by mouth 3 (three) times daily.   benzonatate 100 MG capsule Commonly known as:  TESSALON Take 1 capsule (100 mg total) by mouth 2 (two) times daily as needed for cough.   budesonide 0.5 MG/2ML nebulizer solution Commonly known as:  PULMICORT Take 2 mLs (0.5 mg total) by nebulization 2 (two) times daily. Dx: J43.9   fentaNYL 50 MCG/HR Commonly known as:  [place] 1 patch onto the skin every 3 (three) days. What changed:  how much to take   ferrous sulfate 325 (65 FE) MG tablet Take 325 mg by  mouth daily with breakfast.   Fish Oil 600 MG Caps Take 1 capsule by mouth 3 (three) times daily.   FLUTTER Devi Use as directed   HYDROcodone-acetaminophen 5-325 MG tablet Commonly known as:  NORCO/VICODIN Take 1 tablet by mouth every 8 (eight) hours as needed for up to 5 days for moderate pain.   ipratropium-albuterol 0.5-2.5 (3) MG/3ML Soln Commonly known as:  DUONEB Take 3 mLs by nebulization 4 (four) times daily.   levothyroxine 125 MCG tablet Commonly known as:  SYNTHROID, LEVOTHROID Take 1 tablet (125 mcg total) by mouth daily.   lidocaine 5 % Commonly known as:  LIDODERM Place 1 patch onto the skin daily. Remove & Discard patch within 12 hours or as directed by MD   methylPREDNISolone 4 MG Tbpk tablet Commonly known as:  MEDROL DOSEPAK Day 1: 8 mg PO before breakfast, 4 mg after lunch and after  dinner, and 8 mg at bedtime Day 2: 4 mg PO before breakfast, after lunch, and after dinner and 8 mg at bedtime Day 3: 4 mg PO before breakfast, after lunch, after dinner, and at bedtime Day 4: 4 mg PO before breakfast, after lunch, and at bedtime Day 5: 4 mg PO before breakfast and at bedtime Day 6: 4 mg PO before breakfast   nicotine 14 mg/24hr patch Commonly known as:  NICODERM CQ - dosed in mg/24 hours Place 1 patch (14 mg total) onto the skin daily. Start taking on:  January 25, 2018   nitroGLYCERIN 0.4 MG SL tablet Commonly known as:  NITROSTAT Place 1 tablet (0.4 mg total) under the tongue every 5 (five) minutes as needed for chest pain.   OXYGEN Inhale 3 L into the lungs continuous.   polyethylene glycol packet Commonly known as:  MIRALAX / GLYCOLAX Take 17 g by mouth daily. Start taking on:  January 25, 2018   senna-docusate 8.6-50 MG tablet Commonly known as:  Senokot-S Take 2 tablets by mouth 2 (two) times daily.   ticagrelor 60 MG Tabs tablet Commonly known as:  BRILINTA TAKE 1 TABLET(60 MG) BY MOUTH TWICE DAILY What changed:    how  much to take  how to take this  when to take this  additional instructions   traZODone 50 MG tablet Commonly known as:  DESYREL Take 50 mg by mouth at bedtime as needed for sleep.   warfarin 5 MG tablet Commonly known as:  COUMADIN Take as directed. If you are unsure how to take this medication, talk to your nurse or doctor. Original instructions:  Take 5 mg by mouth daily.   warfarin 1 MG tablet Commonly known as:  COUMADIN Take as directed. If you are unsure how to take this medication, talk to your nurse or doctor. Original instructions:  Take 1 tablet (1mg ) on Sunday, Tuesday, Thursday and Saturday along with a 5mg  tablet or as directed      Review of systems.  General she is not complaining of fever chills.  Skin does not complain of rashes or itching.  Head ears eyes nose mouth and throat is not complaining of visual changes or sore throat.  Respiratory does have a history of COPD on chronic oxygen does not complain of shortness of breath beyond baseline or cough beyond baseline.  Cardiac does not complain of chest pain has mild lower extremity edema with venous stasis changes.  GI is not complaining of abdominal pain nausea or vomiting does complain of constipation says she did have a bowel movement yesterday in the hospital.  GU is not complaining of dysuria.  Musculoskeletal has weakness and back pain-she does not complain of acute pain however at this time.  Neurologic is not complaining of dizziness headache numbness or syncope.  And psych does not complain of overt depression or anxiety appears a little nervous says this has been somewhat present since she has been in the hospital.  Physical exam. Temp is 98.1 pulse of 90 respirations of 20 blood pressure 152/70 O2 saturation is in the 90s on 3 L oxygen.  In general this is a pleasant elderly female in no distress sitting on the side of her bed.  Her skin is warm and dry she does have venous stasis  changes scaling of her lower extremities bilaterally.  Eyes visual acuity appears to be intact sclera and conjunctive are clear.  Oropharynx is clear mucous membranes moist.  Chest had scattered rhonchi  but with cough this did clear she has shallow air entry.  Heart he has regular rate and rhythm without murmur gallop or rub somewhat distant heart sounds.  Abdomen is soft nontender with positive bowel sounds.  Musculoskeletal appears able to move all her extremities x4 with some lower extremity weakness she does have a Lidoderm patch applied to her lower back.  Neurologic appears grossly intact her speech is clear could not really appreciate lateralizing findings.  Psych she is alert oriented pleasant appropriate appears to be a bit jittery nervous  Labs.  January 24, 2018.  Sodium 138 potassium 3.7 BUN 17 creatinine 0.82.  WBC 15.4 hemoglobin 9.8 platelets 216.  INR 2.26.  Assessment and plan.  1.  COPD with exacerbation- this apparently improved in the hospital apparently she received IV steroids- and these are being tapered down she is on a tapering dose of p.o. steroids-I see she has a listed allergy to prednisone but apparently tolerated steroids in the hospital well- this was discussed with Dr.Gupta and that this point will monitor for any allergic reaction I suspect the likelihood is quite low She is also receiving PRN nebulizers- Brovana twice a day- budesonide nebs twice a day- and duo nebs 4 times a day.   #2 history of L3 compression fracture again recommendation for no surgery recommendation is for a TLSO brace- pain control continues with her chronic Duragesic patch-Lidoderm patch also has been added-as well as PRN Vicodin she is also on baclofen 3 times a day at this point will monitor.  Apparently she has a long history of chronic pain and has been on Duragesic patch for an extended period of time.  3.-History of pulmonary embolism with factor V Leiden patient  continues on Coumadin INR is therapeutic today will update this tomorrow.  4.  History of coronary artery disease at this point appears to be stable she is on Brilinta  5.  History of hyperglycemia thought to be steroid-induced will check blood sugars every morning.  6.  History of hypothyroidism she continues on Synthroid will update a TSH.  7.  History of tobacco use- nicotine patch has been prescribed.  8.  Constipation she is complaining of having difficulty with her bowel movements appears MiraLAX has just been added prior to discharge she is also on Senokot twice a day at this point will monitor.  9.  History of anemia she is on iron hemoglobin was 9.8 in the hospital appears recent baseline is in the high nines to around 11- will update this tomorrow as well.  10.  Leukocytosis I suspect this is steroid-induced will check this tomorrow as well.  11.  Insomnia she is on trazodone as needed at night.  [id number] note greater than 45 minutes spent assessing patient- reviewing her chart and labs- and coordinating and formulating a plan of care for numerous diagnoses- of note greater than 50% of time spent coordinating a plan of care with input as noted above

## 2018-01-24 NOTE — Progress Notes (Signed)
Physical Therapy Treatment Patient Details Name: [name] MRN: [id number] DOB: 1950/02/23 Today's Date: 01/24/2018    History of Present Illness 68 y.o. female with medical history significant of COPD, chronic hypoxia on 2 to 3 L of oxygen, ongoing smoker, coronary artery disease, factor V [place] and history of pulmonary embolism on Coumadin.  She presented to the emergency room after a fall at home.  MRI revealed L3 compression fx.     PT Comments    Patient progressing with therapy, increasing ambulation distance before seated rest break. Now contact guard for safety. Educated on principles of energy conservation and encouraged to slow down during walking as patient moves extremely quick and then reaches a point where she needs prolonged seated rest break to recover.  Cont to rec SNF for safety.     Follow Up Recommendations  SNF     Equipment Recommendations  None recommended by PT    Recommendations for Other Services       Precautions / Restrictions Precautions Precautions: Fall;Back Precaution Booklet Issued: Yes (comment) Precaution Comments: Pt educated on 3/3 back precautions. Required Braces or Orthoses: Spinal Brace Spinal Brace: Thoracolumbosacral orthotic;Applied in sitting position Restrictions Weight Bearing Restrictions: No    Mobility  Bed Mobility Overal bed mobility: Needs Assistance Bed Mobility: Rolling;Sidelying to Sit Rolling: Supervision Sidelying to sit: Supervision       General bed mobility comments: supervision for log roll   Transfers Overall transfer level: Needs assistance Equipment used: Rolling walker (2 wheeled) Transfers: Sit to/from Stand Sit to Stand: Min guard         General transfer comment: min guard for safety  Ambulation/Gait Ambulation/Gait assistance: Supervision;Min guard Gait Distance (Feet): 150 Feet Assistive device: Rolling walker (2 wheeled) Gait Pattern/deviations: Step-through pattern;Decreased  stride length;Drifts right/left Gait velocity: increased   General Gait Details: chair follow x1 seated rest break after 85', patient ambulates extremly quick despite cues for slowing down to conserve enegry. SpO2 88% on 4l, HR 115.    Stairs             Wheelchair Mobility    Modified [place] (Stroke Patients Only)       Balance Overall balance assessment: Needs assistance Sitting-balance support: No upper extremity supported;Feet supported Sitting balance-Leahy Scale: Good     Standing balance support: Bilateral upper extremity supported;During functional activity Standing balance-Leahy Scale: Poor Standing balance comment: reliant on RW, min guard for safety                             Cognition Arousal/Alertness: Awake/alert Behavior During Therapy: WFL for tasks assessed/performed Overall Cognitive Status: Within Functional Limits for tasks assessed                                        Exercises      General Comments General comments (skin integrity, edema, etc.): 3L at rest, 4L with activity.       Pertinent Vitals/Pain Pain Assessment: Faces Faces Pain Scale: Hurts a little bit Pain Location: back wiht initial mobility Pain Descriptors / Indicators: Grimacing;Guarding;Moaning Pain Intervention(s): Limited activity within patient's tolerance;Monitored during session    Home Living                      Prior Function  PT Goals (current goals can now be found in the care plan section) Acute Rehab PT Goals Patient Stated Goal: decrease pain PT Goal Formulation: With patient Time For Goal Achievement: 01/28/18 Potential to Achieve Goals: Good Progress towards PT goals: Progressing toward goals    Frequency    Min 5X/week      PT Plan Current plan remains appropriate    Co-evaluation              AM-PAC PT "6 Clicks" Mobility   Outcome Measure  Help needed turning from your back to  your side while in a flat bed without using bedrails?: None Help needed moving from lying on your back to sitting on the side of a flat bed without using bedrails?: A Little Help needed moving to and from a bed to a chair (including a wheelchair)?: A Little Help needed standing up from a chair using your arms (e.g., wheelchair or bedside chair)?: A Little Help needed to walk in hospital room?: A Little Help needed climbing 3-5 steps with a railing? : A Lot 6 Click Score: 18    End of Session Equipment Utilized During Treatment: Gait belt;Back brace;Oxygen Activity Tolerance: Patient tolerated treatment well Patient left: in chair;with chair alarm set;with call bell/phone within reach Nurse Communication: Mobility status PT Visit Diagnosis: Other abnormalities of gait and mobility (R26.89);Difficulty in walking, not elsewhere classified (R26.2);Pain;History of falling (Z91.81)     Time: 1308-6578 PT Time Calculation (min) (ACUTE ONLY): 27 min  Charges:  $Gait Training: 23-37 mins                     Reinaldo Berber, PT, DPT Acute Rehabilitation Services Pager: [phone] Office: [phone]     Reinaldo Berber 01/24/2018, 11:08 AM

## 2018-01-24 NOTE — Progress Notes (Signed)
ANTICOAGULATION CONSULT NOTE- Follow up  Pharmacy Consult for Coumadin Indication: h/o factor V leiden  def, h/o PE  Allergies  Allergen Reactions  . Dilaudid [Hydromorphone Hcl] Hives and Nausea Only  . Minocycline Hcl     REACTION: Dizzy  . Prednisone     REACTION: feels like throat swelling, hallucinations  . Varenicline Tartrate     REACTION: Dizzy(chantix)   . Zocor [Simvastatin - High Dose] Other (See Comments)    myalgia    Patient Measurements: Height: 5\' 4"  (162.6 cm) Weight: 164 lb 3.9 oz (74.5 kg) IBW/kg (Calculated) : 54.7  Vital Signs: Temp: 98.4 F (36.9 C) (01/16 0737) Temp Source: Oral (01/15 2344) BP: 133/65 (01/16 0737) Pulse Rate: 71 (01/16 0737)  Labs: Recent Labs    01/22/18 0218 01/23/18 0238 01/24/18 0233  HGB 9.7*  --  9.8*  HCT 31.6*  --  31.0*  PLT 186  --  216  LABPROT 30.7* 20.9* 24.7*  INR 3.00 1.83 2.26  CREATININE 0.92  --  0.82    Estimated Creatinine Clearance: 65.8 mL/min (by C-G formula based on SCr of 0.82 mg/dL).  Assessment: 16 YOF who presented on 1/12 s/p fall and SOB. The patient was on warfarin PTA for hx factor V leiden and PE. Admit INR 2.67 on PTA dose of 6 mg/day EXCEPT for 5 mg on MWF. Pharmacy consulted to resume dosing this admission.  INR today is therapeutic at 2.26. Hgb 9.8 and platelets 216 both stable - no active bleeding noted.   Noted that the patient is also on solu-medrol which can increase the warfarin sensitivity.   Goal of Therapy:  INR 2-3 Monitor platelets by anticoagulation protocol: Yes   Plan:  - Repeat Warfarin 6 mg x 1 dose at 1800 today - Will continue to monitor for any signs/symptoms of bleeding and will follow up with PT/INR in the a.m.   Thank you for allowing pharmacy to be a part of this patient's care.  Alanda Slim, PharmD, [facility] Clinical Pharmacist Please see AMION for all Pharmacists' Contact Phone Numbers 01/24/2018, 7:41 AM

## 2018-01-25 ENCOUNTER — Encounter (HOSPITAL_COMMUNITY)
Admission: RE | Admit: 2018-01-25 | Discharge: 2018-01-25 | Disposition: A | Discharge status: Home / Self Care | Payer: Medicare Other | Source: Skilled Nursing Facility | Attending: Internal Medicine | Admitting: Internal Medicine

## 2018-01-25 DIAGNOSIS — E785 Hyperlipidemia, unspecified: Secondary | ICD-10-CM | POA: Insufficient documentation

## 2018-01-25 DIAGNOSIS — E039 Hypothyroidism, unspecified: Secondary | ICD-10-CM | POA: Insufficient documentation

## 2018-01-25 DIAGNOSIS — G8929 Other chronic pain: Secondary | ICD-10-CM | POA: Insufficient documentation

## 2018-01-25 DIAGNOSIS — D6851 Activated protein C resistance: Secondary | ICD-10-CM | POA: Insufficient documentation

## 2018-01-25 LAB — CBC WITH DIFFERENTIAL/PLATELET
Abs Immature Granulocytes: 0.47 10*3/uL — ABNORMAL HIGH (ref 0.00–0.07)
BASOS ABS: 0.1 10*3/uL (ref 0.0–0.1)
Basophils Relative: 1 %
Eosinophils Absolute: 0 10*3/uL (ref 0.0–0.5)
Eosinophils Relative: 0 %
HCT: 40.4 % (ref 36.0–46.0)
Hemoglobin: 11.8 g/dL — ABNORMAL LOW (ref 12.0–15.0)
IMMATURE GRANULOCYTES: 3 %
Lymphocytes Relative: 9 %
Lymphs Abs: 1.4 10*3/uL (ref 0.7–4.0)
MCH: 29.1 pg (ref 26.0–34.0)
MCHC: 29.2 g/dL — ABNORMAL LOW (ref 30.0–36.0)
MCV: 99.5 fL (ref 80.0–100.0)
Monocytes Absolute: 1.5 10*3/uL — ABNORMAL HIGH (ref 0.1–1.0)
Monocytes Relative: 9 %
NRBC: 0 % (ref 0.0–0.2)
Neutro Abs: 13.1 10*3/uL — ABNORMAL HIGH (ref 1.7–7.7)
Neutrophils Relative %: 78 %
Platelets: 170 10*3/uL (ref 150–400)
RBC: 4.06 MIL/uL (ref 3.87–5.11)
RDW: 16.9 % — ABNORMAL HIGH (ref 11.5–15.5)
WBC: 16.7 10*3/uL — ABNORMAL HIGH (ref 4.0–10.5)

## 2018-01-25 LAB — BASIC METABOLIC PANEL
Anion gap: 9 (ref 5–15)
BUN: 20 mg/dL (ref 8–23)
CO2: 33 mmol/L — ABNORMAL HIGH (ref 22–32)
Calcium: 8.6 mg/dL — ABNORMAL LOW (ref 8.9–10.3)
Chloride: 95 mmol/L — ABNORMAL LOW (ref 98–111)
Creatinine, Ser: 0.75 mg/dL (ref 0.44–1.00)
GFR calc non Af Amer: >60 mL/min (ref >60)
Glucose, Bld: 89 mg/dL (ref 70–99)
Potassium: 4.2 mmol/L (ref 3.5–5.1)
SODIUM: 137 mmol/L (ref 135–145)

## 2018-01-25 LAB — PROTIME-INR
INR: 2.51
Prothrombin Time: 26.7 seconds — ABNORMAL HIGH (ref 11.4–15.2)

## 2018-01-26 ENCOUNTER — Encounter (HOSPITAL_COMMUNITY)
Admission: AD | Admit: 2018-01-26 | Discharge: 2018-01-26 | Disposition: A | Discharge status: Home / Self Care | Payer: Medicare Other | Source: Skilled Nursing Facility

## 2018-01-26 DIAGNOSIS — J9621 Acute and chronic respiratory failure with hypoxia: Secondary | ICD-10-CM | POA: Diagnosis not present

## 2018-01-26 DIAGNOSIS — R0602 Shortness of breath: Secondary | ICD-10-CM | POA: Diagnosis not present

## 2018-01-26 DIAGNOSIS — D6859 Other primary thrombophilia: Secondary | ICD-10-CM | POA: Diagnosis not present

## 2018-01-26 DIAGNOSIS — D6851 Activated protein C resistance: Secondary | ICD-10-CM | POA: Diagnosis not present

## 2018-01-26 DIAGNOSIS — E872 Acidosis: Secondary | ICD-10-CM | POA: Diagnosis not present

## 2018-01-26 DIAGNOSIS — J9622 Acute and chronic respiratory failure with hypercapnia: Secondary | ICD-10-CM | POA: Diagnosis not present

## 2018-01-26 DIAGNOSIS — R000 Tachycardia, unspecified: Secondary | ICD-10-CM | POA: Diagnosis not present

## 2018-01-26 DIAGNOSIS — J189 Pneumonia, unspecified organism: Secondary | ICD-10-CM | POA: Diagnosis not present

## 2018-01-26 DIAGNOSIS — J441 Chronic obstructive pulmonary disease with (acute) exacerbation: Secondary | ICD-10-CM | POA: Diagnosis not present

## 2018-01-26 LAB — PROTIME-INR
INR: 3.08
Prothrombin Time: 31.3 seconds — ABNORMAL HIGH (ref 11.4–15.2)

## 2018-01-28 ENCOUNTER — Inpatient Hospital Stay (HOSPITAL_COMMUNITY)
Admission: EM | Admit: 2018-01-28 | Discharge: 2018-02-01 | DRG: 190 | Disposition: A | Discharge status: Skilled Nursing Facility | Payer: Medicare Other | Attending: Family Medicine | Admitting: Family Medicine

## 2018-01-28 ENCOUNTER — Encounter: Payer: Self-pay | Admitting: Internal Medicine

## 2018-01-28 ENCOUNTER — Other Ambulatory Visit: Payer: Self-pay

## 2018-01-28 ENCOUNTER — Encounter (HOSPITAL_COMMUNITY): Payer: Self-pay

## 2018-01-28 ENCOUNTER — Telehealth: Payer: Self-pay | Admitting: Pulmonary Disease

## 2018-01-28 ENCOUNTER — Non-Acute Institutional Stay (SKILLED_NURSING_FACILITY): Payer: Medicare Other | Admitting: Internal Medicine

## 2018-01-28 ENCOUNTER — Encounter (HOSPITAL_COMMUNITY)
Admission: RE | Admit: 2018-01-28 | Discharge: 2018-01-28 | Disposition: A | Discharge status: Home / Self Care | Payer: Medicare Other | Source: Skilled Nursing Facility | Attending: *Deleted | Admitting: *Deleted

## 2018-01-28 ENCOUNTER — Emergency Department (HOSPITAL_COMMUNITY): Payer: Medicare Other

## 2018-01-28 DIAGNOSIS — W19XXXA Unspecified fall, initial encounter: Secondary | ICD-10-CM | POA: Diagnosis present

## 2018-01-28 DIAGNOSIS — Z7951 Long term (current) use of inhaled steroids: Secondary | ICD-10-CM

## 2018-01-28 DIAGNOSIS — D6851 Activated protein C resistance: Secondary | ICD-10-CM | POA: Diagnosis present

## 2018-01-28 DIAGNOSIS — Z885 Allergy status to narcotic agent status: Secondary | ICD-10-CM

## 2018-01-28 DIAGNOSIS — Z9861 Coronary angioplasty status: Secondary | ICD-10-CM

## 2018-01-28 DIAGNOSIS — Z7901 Long term (current) use of anticoagulants: Secondary | ICD-10-CM

## 2018-01-28 DIAGNOSIS — J9611 Chronic respiratory failure with hypoxia: Secondary | ICD-10-CM | POA: Diagnosis not present

## 2018-01-28 DIAGNOSIS — F17200 Nicotine dependence, unspecified, uncomplicated: Secondary | ICD-10-CM

## 2018-01-28 DIAGNOSIS — I2510 Atherosclerotic heart disease of native coronary artery without angina pectoris: Secondary | ICD-10-CM | POA: Diagnosis present

## 2018-01-28 DIAGNOSIS — Z86711 Personal history of pulmonary embolism: Secondary | ICD-10-CM

## 2018-01-28 DIAGNOSIS — F17210 Nicotine dependence, cigarettes, uncomplicated: Secondary | ICD-10-CM | POA: Diagnosis present

## 2018-01-28 DIAGNOSIS — J9622 Acute and chronic respiratory failure with hypercapnia: Secondary | ICD-10-CM | POA: Diagnosis present

## 2018-01-28 DIAGNOSIS — J9620 Acute and chronic respiratory failure, unspecified whether with hypoxia or hypercapnia: Secondary | ICD-10-CM | POA: Diagnosis present

## 2018-01-28 DIAGNOSIS — M8008XD Age-related osteoporosis with current pathological fracture, vertebra(e), subsequent encounter for fracture with routine healing: Secondary | ICD-10-CM | POA: Diagnosis not present

## 2018-01-28 DIAGNOSIS — E039 Hypothyroidism, unspecified: Secondary | ICD-10-CM | POA: Diagnosis present

## 2018-01-28 DIAGNOSIS — Z7952 Long term (current) use of systemic steroids: Secondary | ICD-10-CM

## 2018-01-28 DIAGNOSIS — D6859 Other primary thrombophilia: Secondary | ICD-10-CM | POA: Diagnosis present

## 2018-01-28 DIAGNOSIS — J441 Chronic obstructive pulmonary disease with (acute) exacerbation: Principal | ICD-10-CM | POA: Diagnosis present

## 2018-01-28 DIAGNOSIS — J189 Pneumonia, unspecified organism: Secondary | ICD-10-CM

## 2018-01-28 DIAGNOSIS — J9621 Acute and chronic respiratory failure with hypoxia: Secondary | ICD-10-CM | POA: Diagnosis present

## 2018-01-28 DIAGNOSIS — E872 Acidosis: Secondary | ICD-10-CM | POA: Diagnosis present

## 2018-01-28 DIAGNOSIS — Z79899 Other long term (current) drug therapy: Secondary | ICD-10-CM

## 2018-01-28 DIAGNOSIS — Z9981 Dependence on supplemental oxygen: Secondary | ICD-10-CM | POA: Diagnosis not present

## 2018-01-28 DIAGNOSIS — E785 Hyperlipidemia, unspecified: Secondary | ICD-10-CM | POA: Diagnosis present

## 2018-01-28 DIAGNOSIS — D6852 Prothrombin gene mutation: Secondary | ICD-10-CM | POA: Diagnosis present

## 2018-01-28 DIAGNOSIS — M8008XA Age-related osteoporosis with current pathological fracture, vertebra(e), initial encounter for fracture: Secondary | ICD-10-CM | POA: Diagnosis present

## 2018-01-28 DIAGNOSIS — L8990 Pressure ulcer of unspecified site, unspecified stage: Secondary | ICD-10-CM | POA: Diagnosis present

## 2018-01-28 DIAGNOSIS — J9600 Acute respiratory failure, unspecified whether with hypoxia or hypercapnia: Secondary | ICD-10-CM | POA: Diagnosis present

## 2018-01-28 DIAGNOSIS — Z888 Allergy status to other drugs, medicaments and biological substances status: Secondary | ICD-10-CM

## 2018-01-28 LAB — CBC WITH DIFFERENTIAL/PLATELET
Abs Immature Granulocytes: 0.44 10*3/uL — ABNORMAL HIGH (ref 0.00–0.07)
Basophils Absolute: 0.1 10*3/uL (ref 0.0–0.1)
Basophils Relative: 0 %
EOS PCT: 0 %
Eosinophils Absolute: 0 10*3/uL (ref 0.0–0.5)
HCT: 34.7 % — ABNORMAL LOW (ref 36.0–46.0)
Hemoglobin: 10.1 g/dL — ABNORMAL LOW (ref 12.0–15.0)
Immature Granulocytes: 2 %
LYMPHS PCT: 6 %
Lymphs Abs: 1.5 10*3/uL (ref 0.7–4.0)
MCH: 29.1 pg (ref 26.0–34.0)
MCHC: 29.1 g/dL — ABNORMAL LOW (ref 30.0–36.0)
MCV: 100 fL (ref 80.0–100.0)
Monocytes Absolute: 2.7 10*3/uL — ABNORMAL HIGH (ref 0.1–1.0)
Monocytes Relative: 11 %
Neutro Abs: 19.9 10*3/uL — ABNORMAL HIGH (ref 1.7–7.7)
Neutrophils Relative %: 81 %
Platelets: 285 10*3/uL (ref 150–400)
RBC: 3.47 MIL/uL — ABNORMAL LOW (ref 3.87–5.11)
RDW: 16.7 % — ABNORMAL HIGH (ref 11.5–15.5)
WBC: 24.7 10*3/uL — ABNORMAL HIGH (ref 4.0–10.5)
nRBC: 0 % (ref 0.0–0.2)

## 2018-01-28 LAB — BASIC METABOLIC PANEL
Anion gap: 7 (ref 5–15)
BUN: 20 mg/dL (ref 8–23)
CO2: 38 mmol/L — ABNORMAL HIGH (ref 22–32)
Calcium: 8.7 mg/dL — ABNORMAL LOW (ref 8.9–10.3)
Chloride: 94 mmol/L — ABNORMAL LOW (ref 98–111)
Creatinine, Ser: 0.81 mg/dL (ref 0.44–1.00)
GFR calc Af Amer: >60 mL/min (ref >60)
GFR calc non Af Amer: >60 mL/min (ref >60)
GLUCOSE: 85 mg/dL (ref 70–99)
Potassium: 4.5 mmol/L (ref 3.5–5.1)
Sodium: 139 mmol/L (ref 135–145)

## 2018-01-28 MED ORDER — ALBUTEROL SULFATE (2.5 MG/3ML) 0.083% IN NEBU
5.0000 mg | INHALATION_SOLUTION | Freq: Once | RESPIRATORY_TRACT | Status: AC
  Administered 2018-01-28: 5 mg via RESPIRATORY_TRACT
  Filled 2018-01-28: qty 6

## 2018-01-28 MED ORDER — ALBUTEROL (5 MG/ML) CONTINUOUS INHALATION SOLN
10.0000 mg/h | INHALATION_SOLUTION | Freq: Once | RESPIRATORY_TRACT | Status: AC
  Administered 2018-01-28: 10 mg/h via RESPIRATORY_TRACT
  Filled 2018-01-28: qty 20

## 2018-01-28 MED ORDER — IPRATROPIUM-ALBUTEROL 0.5-2.5 (3) MG/3ML IN SOLN
3.0000 mL | Freq: Once | RESPIRATORY_TRACT | Status: AC
  Administered 2018-01-28: 3 mL via RESPIRATORY_TRACT
  Filled 2018-01-28: qty 3

## 2018-01-28 MED ORDER — IPRATROPIUM BROMIDE 0.02 % IN SOLN
0.5000 mg | Freq: Once | RESPIRATORY_TRACT | Status: AC
  Administered 2018-01-28: 0.5 mg via RESPIRATORY_TRACT
  Filled 2018-01-28: qty 2.5

## 2018-01-28 MED ORDER — ONDANSETRON HCL 4 MG/2ML IJ SOLN
4.0000 mg | Freq: Once | INTRAMUSCULAR | Status: AC
  Administered 2018-01-28: 4 mg via INTRAVENOUS
  Filled 2018-01-28: qty 2

## 2018-01-28 MED ORDER — MAGNESIUM SULFATE 2 GM/50ML IV SOLN
2.0000 g | Freq: Once | INTRAVENOUS | Status: AC
  Administered 2018-01-28: 2 g via INTRAVENOUS
  Filled 2018-01-28: qty 50

## 2018-01-28 MED ORDER — ALBUTEROL (5 MG/ML) CONTINUOUS INHALATION SOLN
INHALATION_SOLUTION | RESPIRATORY_TRACT | Status: AC
  Administered 2018-01-28: 10 mg/h via RESPIRATORY_TRACT
  Filled 2018-01-28: qty 20

## 2018-01-28 MED ORDER — DEXAMETHASONE SODIUM PHOSPHATE 10 MG/ML IJ SOLN
10.0000 mg | Freq: Once | INTRAMUSCULAR | Status: AC
  Administered 2018-01-28: 10 mg via INTRAVENOUS
  Filled 2018-01-28: qty 1

## 2018-01-28 NOTE — Progress Notes (Signed)
Provider: Veleta Miners MD  Location:    [facility] Room Number: 143/P Place of Service:  SNF (31)  PCP: [name], NP Patient Care Team: [name], NP as PCP - General (Family Medicine) Herminio Commons, MD as PCP - Cardiology (Cardiology)  Extended Emergency Contact Information Primary Emergency Contact: Franko,[name] Address: [place], [place] Phone: [phone] Relation: Daughter  Code Status: Full Code Goals of Care: Advanced Directive information Advanced Directives 01/28/2018  Does Patient Have a Medical Advance Directive? Yes  Type of Advance Directive (No Data)  Does patient want to make changes to medical advance directive? No - Patient declined  Copy of [place] in Chart? No - copy requested  Pre-existing out of facility DNR order (yellow form or pink MOST form) -      Chief Complaint  Patient presents with  . New Admit To SNF    New Admission Visit    HPI: Patient is a 68 y.o. female seen today for admission to SNF for therapy. Patient was admitted in the hospital From 01/11-01/16 for COPD Exacerbation and Compression Fracture in back.  Patient has history of COPD on oxygen 2 to 3 L, ongoing smoker, CAD, factor V [place] def, history of PE.  Patient fell at home.  She is not sure how she fell she just lost her balance.  She denies any chest pain dizziness.  She went to ED week with complaint of back pain.  She also has a history of shortness of breath and was progressively getting worse.  Denies any fever or chills In the hospital patient was found to have L3 compression fracture on the MRI. She was seen by neurosurgery and they suggested TLSO brace and pain control. She was also treated with IV steroids and bronchodilators for her COPD. Patient today had number of complaints especially about her nursing care. She was complaining also of cough with  greenish sputum.  She is also upset that she was discharged on prednisone as it gives her  hallucinations.  Her back pain is controlled and she is working with therapy Patient lives by herself.  Her daughter is out of town she was driving before she fell Past Medical History:  Diagnosis Date  . Acute MI ([place])    x4, code blue x3  . Arteriosclerotic cardiovascular disease (ASCVD) 2002   Inf STEMI-2002. 2003-cutting balloon + brachytherapy for restenosis; subsequent acute stent thrombosis 06/2010 requiring 2 separate interventions (Zeta stent, then repeat cath with thrombectomy). focal basal inf AK, nl EF; 03/2011: Patent stents, minor nonobst  residual dz, nl EF; neg stress nuclear in 2008 and stress echo in 2009  . Chronic anticoagulation    Warfarin plus ticagrelor  . Chronic respiratory failure ([place]) 08/27/2013   On 2L 02  . COPD (chronic obstructive pulmonary disease) ([place])    02 dependent  . COPD (chronic obstructive pulmonary disease) ([place])   . Factor 5 Leiden mutation, heterozygous ([place])   . Factor V Leiden, prothrombin gene mutation ([place]) 2006  . Hyperlipidemia   . Hypothyroidism   . Noncompliance   . Pelvic fracture ([place]) 2009  . Pulmonary embolism ([place]) 2006   Associated with deep vein thrombosis-2006; + factor V Leiden  . Tobacco abuse    50 pack years   Past Surgical History:  Procedure Laterality Date  . COLONOSCOPY  Approximately 2000   Negative screening  study  . CORONARY ANGIOPLASTY  2002, 2003, 2012  . LEFT AND RIGHT HEART CATHETERIZATION WITH CORONARY ANGIOGRAM N/A 04/03/2011   Procedure: LEFT AND RIGHT HEART CATHETERIZATION WITH CORONARY ANGIOGRAM;  Surgeon: Sherren Mocha, MD;  Location: [facility] CATH LAB;  Service: Cardiovascular;  Laterality: N/A;    reports that she has been smoking cigarettes. She started smoking about 49 years ago. She has a 25.00 pack-year smoking history. She has never used smokeless tobacco. She reports that she does not drink alcohol or use  drugs. Social History   Socioeconomic History  . Marital status: Single    Spouse name: Not on file  . Number of children: Not on file  . Years of education: Not on file  . Highest education level: Not on file  Occupational History  . Occupation: Disabled  . 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 Financial resource strain: Not on file  . Food insecurity:    Worry: Not on file    Inability: Not on file  . Transportation needs:    Medical: Not on file    Non-medical: Not on file  Tobacco Use  . Smoking status: Current Some Day Smoker    Packs/day: 0.50    Years: 50.00    Pack years: 25.00    Types: Cigarettes    Start date: 05/10/1968  . Smokeless tobacco: Never Used  Substance and Sexual Activity  . Alcohol use: No    Alcohol/week: 0.0 standard drinks  . Drug use: No  . Sexual activity: Not on file  Lifestyle  . Physical activity:    Days per week: Not on file    Minutes per session: Not on file  . Stress: Not on file  Relationships  . Social connections:    Talks on phone: Not on file    Gets together: Not on file    Attends religious service: Not on file    Active member of club or organization: Not on file    Attends meetings of clubs or organizations: Not on file    Relationship status: Not on file  . Intimate partner violence:    Fear of current or ex partner: Not on file    Emotionally abused: Not on file    Physically abused: Not on file    Forced sexual activity: Not on file  Other Topics Concern  . Not on file  Social History Narrative   Originally from [place]. Previously has lived in [place], [place], [place] & moved back to [place] in 1994. 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 Ronie Spies was in her current home. She also had an owl living in her house as a rehab pet. She also had chickens until Fall 2015. No mold exposure.     Functional Status Survey:    Family History  Problem Relation  Age of Onset  . Emphysema Mother   . Alzheimer's disease Mother   . Emphysema Sister   . Heart disease Father   . Factor V Leiden deficiency Father   . Factor V Leiden deficiency Sister   . Factor V Leiden deficiency Daughter   . Kidney cancer Paternal Grandmother     Health Maintenance  Topic Date Due  . MAMMOGRAM  02/28/2018 (Originally 2020/06/2411)  . DEXA SCAN  02/28/2018 (Originally 04/28/2015)  . TETANUS/TDAP  02/28/2018 (Originally 09/07/2014)  . Hepatitis C Screening  02/28/2018 (Originally 11-19-1950)  . Fecal  DNA (Cologuard)  09/20/2019  . INFLUENZA VACCINE  Completed  . PNA vac Low Risk Adult  Completed    Allergies  Allergen Reactions  . Dilaudid [Hydromorphone Hcl] Hives and Nausea Only  . Minocycline Hcl     REACTION: Dizzy  . Prednisone     REACTION: feels like throat swelling, hallucinations  . Varenicline Tartrate     REACTION: Dizzy(chantix)   . Zocor [Simvastatin - High Dose] Other (See Comments)    myalgia    Outpatient Encounter Medications as of 01/28/2018  Medication Sig  . albuterol (PROVENTIL) (2.5 MG/3ML) 0.083% nebulizer solution USE 1 VIAL BY NEBULIZER EVERY 4 HOURS AS NEEDED FOR WHEEZING. DX: J44.9  . arformoterol (BROVANA) 15 MCG/2ML NEBU Take 2 mLs (15 mcg total) by nebulization 2 (two) times daily.  . baclofen (LIORESAL) 10 MG tablet Take 1 tablet (10 mg total) by mouth 3 (three) times daily.  . benzonatate (TESSALON) 100 MG capsule Take 1 capsule (100 mg total) by mouth 2 (two) times daily as needed for cough.  . budesonide (PULMICORT) 0.5 MG/2ML nebulizer solution Take 2 mLs (0.5 mg total) by nebulization 2 (two) times daily. Dx: J43.9  . fentaNYL (DURAGESIC) 50 MCG/HR Place 1 patch onto the skin every 3 (three) days.  . ferrous sulfate 325 (65 FE) MG tablet Take 325 mg by mouth daily with breakfast.  . HYDROcodone-acetaminophen (NORCO/VICODIN) 5-325 MG tablet Take 1 tablet by mouth every 8 (eight) hours as needed for up to 5 days for moderate  pain.  [name] ipratropium-albuterol (DUONEB) 0.5-2.5 (3) MG/3ML SOLN Take 3 mLs by nebulization 4 (four) times daily.  [name] levothyroxine (SYNTHROID, LEVOTHROID) 125 MCG tablet Take 1 tablet (125 mcg total) by mouth daily.  [name] lidocaine (LIDODERM) 5 % Place 1 patch onto the skin daily. Remove & Discard patch within 12 hours or as directed by MD  . methylPREDNISolone (MEDROL DOSEPAK) 4 MG TBPK tablet Day 1: 8 mg PO before breakfast, 4 mg after lunch and after dinner, and 8 mg at bedtime Day 2: 4 mg PO before breakfast, after lunch, and after dinner and 8 mg at bedtime Day 3: 4 mg PO before breakfast, after lunch, after dinner, and at bedtime Day 4: 4 mg PO before breakfast, after lunch, and at bedtime Day 5: 4 mg PO before breakfast and at bedtime Day 6: 4 mg PO before breakfast  . nicotine (NICODERM CQ - DOSED IN MG/24 HOURS) 14 mg/24hr patch Place 1 patch (14 mg total) onto the skin daily.  . nitroGLYCERIN (NITROSTAT) 0.4 MG SL tablet Place 1 tablet (0.4 mg total) under the tongue every 5 (five) minutes as needed for chest pain.  . Omega-3 Fatty Acids (FISH OIL) 600 MG CAPS Take 1 capsule by mouth 3 (three) times daily.  . OXYGEN Inhale 3 L into the lungs continuous.   . polyethylene glycol (MIRALAX / GLYCOLAX) packet Take 17 g by mouth daily.  [name] Respiratory Therapy Supplies (FLUTTER) DEVI Use as directed  . senna-docusate (SENOKOT-S) 8.6-50 MG tablet Take 2 tablets by mouth 2 (two) times daily.  . ticagrelor (BRILINTA) 60 MG TABS tablet TAKE 1 TABLET(60 MG) BY MOUTH TWICE DAILY  . traZODone (DESYREL) 50 MG tablet Take 50 mg by mouth at bedtime as needed for sleep.  [name] warfarin (COUMADIN) 1 MG tablet Give 0.5 mg by mouth along with 5 mg to = 5.5 mg once a day  . warfarin (COUMADIN) 5 MG tablet Give 5 mg by mouth along with 0.5 mg  to = 5.5 mg once a day  . [DISCONTINUED] warfarin (COUMADIN) 1 MG tablet Take 1 tablet (1mg ) on Sunday, Tuesday, Thursday and Saturday along with a 5mg  tablet or as directed  .  [DISCONTINUED] warfarin (COUMADIN) 5 MG tablet Take 5 mg by mouth daily.   No facility-administered encounter medications on file as of 01/28/2018.      Review of Systems  Constitutional: Negative.   HENT: Negative.   Respiratory: Positive for cough, shortness of breath and wheezing.   Cardiovascular: Positive for leg swelling.  Gastrointestinal: Negative.   Genitourinary: Positive for frequency.  Musculoskeletal: Positive for back pain.  Skin: Negative.   Neurological: Positive for weakness. Negative for dizziness.  Psychiatric/Behavioral: Negative.     Vitals:   01/28/18 0900  BP: (!) 121/58  Pulse: 91  Resp: 19  Temp: 97.7 F (36.5 C)  TempSrc: Oral  SpO2: 94%   There is no height or weight on file to calculate BMI. Physical Exam Constitutional:      Appearance: Normal appearance.  HENT:     Head: Normocephalic.     Nose: Nose normal.     Mouth/Throat:     Mouth: Mucous membranes are moist.     Pharynx: Oropharynx is clear.  Eyes:     Pupils: Pupils are equal, round, and reactive to light.  Neck:     Musculoskeletal: Neck supple.  Cardiovascular:     Rate and Rhythm: Normal rate and regular rhythm.     Pulses: Normal pulses.     Heart sounds: Normal heart sounds.  Pulmonary:     Comments: Patient has Bilateral Wheezing Abdominal:     General: Abdomen is flat. Bowel sounds are normal.     Palpations: Abdomen is soft.  Musculoskeletal:     Comments: Mild Edema Bilateral  Skin:    General: Skin is warm and dry.  Neurological:     General: No focal deficit present.     Mental Status: She is alert and oriented to person, place, and time.  Psychiatric:        Mood and Affect: Mood normal.        Thought Content: Thought content normal.        Judgment: Judgment normal.     Labs reviewed: Basic Metabolic Panel: Recent Labs    02/22/17 1658 02/23/17 0516 02/23/17 1644  01/22/18 0218 01/24/18 0233 01/25/18 0700  NA  --  141  --    < > 137 138 137    K  --  4.0  --    < > 4.8 3.7 4.2  CL  --  102  --    < > 99 97* 95*  CO2  --  29  --    < > 30 34* 33*  GLUCOSE  --  131*  --    < > 131* 137* 89  BUN  --  42*  --    < > 19 17 20   CREATININE  --  1.11*  --    < > 0.92 0.82 0.75  CALCIUM  --  8.2*  --    < > 8.3* 8.0* 8.6*  MG 1.9 1.9 2.0  --   --  1.8  --   PHOS 3.0 3.5 2.8  --   --   --   --    < > = values in this interval not displayed.   Liver Function Tests: Recent Labs    03/01/17 0534 03/02/17 0432 03/03/17 0459  AST 35 50* 42*  ALT 105* 104* 105*  ALKPHOS 60 58 57  BILITOT 0.9 1.0 0.5  PROT 6.2* 5.7* 5.5*  ALBUMIN 2.8* 2.6* 2.5*   No results for input(s): LIPASE, AMYLASE in the last 8760 hours. No results for input(s): AMMONIA in the last 8760 hours. CBC: Recent Labs    03/03/17 0459 08/15/17 1409  01/22/18 0218 01/24/18 0233 01/25/18 0700  WBC 15.8* 6.6   < > 11.3* 15.4* 16.7*  NEUTROABS 14.3* 4.1  --   --   --  13.1*  HGB 12.8 11.8*   < > 9.7* 9.8* 11.8*  HCT 41.4 36.0   < > 31.6* 31.0* 40.4  MCV 91.6 86.0   < > 94.6 94.5 99.5  PLT 242 263.0   < > 186 216 170   < > = values in this interval not displayed.   Cardiac Enzymes: Recent Labs    02/20/17 0619 02/20/17 1234 01/19/18 2121  TROPONINI <0.03 <0.03 <0.03   BNP: Invalid input(s): POCBNP Lab Results  Component Value Date   HGBA1C 6.0 (H) 08/25/2013   Lab Results  Component Value Date   TSH 3.48 08/15/2017   No results found for: VITAMINB12 No results found for: FOLATE Lab Results  Component Value Date   IRON 26 (L) 08/15/2017   TIBC 371 08/15/2017   FERRITIN 20 08/15/2017    Imaging and Procedures obtained prior to SNF admission: No results found.  Assessment/Plan COPD exacerbation  Patient is Wheezing and is hypoxic But she does not want Any steroids. She is on Taper till 2 more days Will also start her on Levaquin Continue Duo Nebs Q6 Also On Brovana and Budesonide  Compression Fracture of vertebra  TLSO Brace Pain  Controlled On Duragesic Patch and Norco PRN Also On Baclofen Follow up with Neurosurgery  Factor V Leiden, prothrombin gene mutation With h/o PE  On Brilinta and Coumadin dose was adjusted Repeat INR pending  TOBACCO ABUSE On Nicotine Patch  Leucocytosis Most Likely due to Steroids Are starting her on Levaquin for her Productive Coughing.  Hyperglycemia BS stable and Less then 100 in facility. Anemia On iron Supplement   Family/ staff Communication:   Labs/tests ordered: Total time spent in this patient care encounter was 45_ minutes; greater than 50% of the visit spent counseling patient, reviewing records , Labs and coordinating care for problems addressed at this encounter.

## 2018-01-28 NOTE — Telephone Encounter (Signed)
Spoke with pt. She wanted to let us know that she broke her back a few months ago and is now at [facility]. Pt states that once she is released she will call to make an appointment with Dr. Lake Bells. Nothing further was needed at this time.

## 2018-01-28 NOTE — ED Notes (Signed)
Attempted IV x2, unsuccessful.  

## 2018-01-28 NOTE — ED Triage Notes (Signed)
Pt brought to ED from [facility] for SOB since 2100. Pt states she took albuterol treatment without relief. Pt with COPD

## 2018-01-29 DIAGNOSIS — D6852 Prothrombin gene mutation: Secondary | ICD-10-CM | POA: Diagnosis present

## 2018-01-29 DIAGNOSIS — D682 Hereditary deficiency of other clotting factors: Secondary | ICD-10-CM | POA: Diagnosis not present

## 2018-01-29 DIAGNOSIS — Z7901 Long term (current) use of anticoagulants: Secondary | ICD-10-CM

## 2018-01-29 DIAGNOSIS — Z885 Allergy status to narcotic agent status: Secondary | ICD-10-CM | POA: Diagnosis not present

## 2018-01-29 DIAGNOSIS — J9622 Acute and chronic respiratory failure with hypercapnia: Secondary | ICD-10-CM | POA: Diagnosis not present

## 2018-01-29 DIAGNOSIS — L8990 Pressure ulcer of unspecified site, unspecified stage: Secondary | ICD-10-CM

## 2018-01-29 DIAGNOSIS — Z7401 Bed confinement status: Secondary | ICD-10-CM | POA: Diagnosis not present

## 2018-01-29 DIAGNOSIS — J441 Chronic obstructive pulmonary disease with (acute) exacerbation: Secondary | ICD-10-CM | POA: Diagnosis present

## 2018-01-29 DIAGNOSIS — D6851 Activated protein C resistance: Secondary | ICD-10-CM

## 2018-01-29 DIAGNOSIS — D6859 Other primary thrombophilia: Secondary | ICD-10-CM | POA: Diagnosis present

## 2018-01-29 DIAGNOSIS — J9620 Acute and chronic respiratory failure, unspecified whether with hypoxia or hypercapnia: Secondary | ICD-10-CM | POA: Diagnosis present

## 2018-01-29 DIAGNOSIS — J189 Pneumonia, unspecified organism: Secondary | ICD-10-CM | POA: Diagnosis not present

## 2018-01-29 DIAGNOSIS — J449 Chronic obstructive pulmonary disease, unspecified: Secondary | ICD-10-CM | POA: Diagnosis not present

## 2018-01-29 DIAGNOSIS — M6281 Muscle weakness (generalized): Secondary | ICD-10-CM | POA: Diagnosis not present

## 2018-01-29 DIAGNOSIS — J9621 Acute and chronic respiratory failure with hypoxia: Secondary | ICD-10-CM | POA: Diagnosis present

## 2018-01-29 DIAGNOSIS — E872 Acidosis: Secondary | ICD-10-CM | POA: Diagnosis present

## 2018-01-29 DIAGNOSIS — F339 Major depressive disorder, recurrent, unspecified: Secondary | ICD-10-CM | POA: Diagnosis not present

## 2018-01-29 DIAGNOSIS — D72829 Elevated white blood cell count, unspecified: Secondary | ICD-10-CM | POA: Diagnosis not present

## 2018-01-29 DIAGNOSIS — Z86718 Personal history of other venous thrombosis and embolism: Secondary | ICD-10-CM | POA: Diagnosis not present

## 2018-01-29 DIAGNOSIS — J439 Emphysema, unspecified: Secondary | ICD-10-CM | POA: Diagnosis not present

## 2018-01-29 DIAGNOSIS — E785 Hyperlipidemia, unspecified: Secondary | ICD-10-CM | POA: Diagnosis present

## 2018-01-29 DIAGNOSIS — M8008XD Age-related osteoporosis with current pathological fracture, vertebra(e), subsequent encounter for fracture with routine healing: Secondary | ICD-10-CM | POA: Diagnosis not present

## 2018-01-29 DIAGNOSIS — Z741 Need for assistance with personal care: Secondary | ICD-10-CM | POA: Diagnosis not present

## 2018-01-29 DIAGNOSIS — Z720 Tobacco use: Secondary | ICD-10-CM | POA: Diagnosis not present

## 2018-01-29 DIAGNOSIS — R5381 Other malaise: Secondary | ICD-10-CM | POA: Diagnosis not present

## 2018-01-29 DIAGNOSIS — Z7951 Long term (current) use of inhaled steroids: Secondary | ICD-10-CM | POA: Diagnosis not present

## 2018-01-29 DIAGNOSIS — R0602 Shortness of breath: Secondary | ICD-10-CM | POA: Diagnosis not present

## 2018-01-29 DIAGNOSIS — Z86711 Personal history of pulmonary embolism: Secondary | ICD-10-CM | POA: Diagnosis not present

## 2018-01-29 DIAGNOSIS — Z79899 Other long term (current) drug therapy: Secondary | ICD-10-CM | POA: Diagnosis not present

## 2018-01-29 DIAGNOSIS — F17210 Nicotine dependence, cigarettes, uncomplicated: Secondary | ICD-10-CM | POA: Diagnosis present

## 2018-01-29 DIAGNOSIS — M8008XA Age-related osteoporosis with current pathological fracture, vertebra(e), initial encounter for fracture: Secondary | ICD-10-CM | POA: Diagnosis present

## 2018-01-29 DIAGNOSIS — I2510 Atherosclerotic heart disease of native coronary artery without angina pectoris: Secondary | ICD-10-CM | POA: Diagnosis present

## 2018-01-29 DIAGNOSIS — M2550 Pain in unspecified joint: Secondary | ICD-10-CM | POA: Diagnosis not present

## 2018-01-29 DIAGNOSIS — R262 Difficulty in walking, not elsewhere classified: Secondary | ICD-10-CM | POA: Diagnosis not present

## 2018-01-29 DIAGNOSIS — Z9861 Coronary angioplasty status: Secondary | ICD-10-CM | POA: Diagnosis not present

## 2018-01-29 DIAGNOSIS — Z888 Allergy status to other drugs, medicaments and biological substances status: Secondary | ICD-10-CM | POA: Diagnosis not present

## 2018-01-29 DIAGNOSIS — D6869 Other thrombophilia: Secondary | ICD-10-CM | POA: Diagnosis not present

## 2018-01-29 DIAGNOSIS — Z7952 Long term (current) use of systemic steroids: Secondary | ICD-10-CM | POA: Diagnosis not present

## 2018-01-29 DIAGNOSIS — E039 Hypothyroidism, unspecified: Secondary | ICD-10-CM | POA: Diagnosis present

## 2018-01-29 DIAGNOSIS — W19XXXA Unspecified fall, initial encounter: Secondary | ICD-10-CM | POA: Diagnosis present

## 2018-01-29 LAB — CBC WITH DIFFERENTIAL/PLATELET
ABS IMMATURE GRANULOCYTES: 0.68 10*3/uL — AB (ref 0.00–0.07)
Basophils Absolute: 0.1 10*3/uL (ref 0.0–0.1)
Basophils Relative: 0 %
Eosinophils Absolute: 0 10*3/uL (ref 0.0–0.5)
Eosinophils Relative: 0 %
HEMATOCRIT: 33.9 % — AB (ref 36.0–46.0)
Hemoglobin: 9.9 g/dL — ABNORMAL LOW (ref 12.0–15.0)
Immature Granulocytes: 3 %
Lymphocytes Relative: 4 %
Lymphs Abs: 1.2 10*3/uL (ref 0.7–4.0)
MCH: 28.8 pg (ref 26.0–34.0)
MCHC: 29.2 g/dL — ABNORMAL LOW (ref 30.0–36.0)
MCV: 98.5 fL (ref 80.0–100.0)
Monocytes Absolute: 3.6 10*3/uL — ABNORMAL HIGH (ref 0.1–1.0)
Monocytes Relative: 14 %
Neutro Abs: 20.9 10*3/uL — ABNORMAL HIGH (ref 1.7–7.7)
Neutrophils Relative %: 79 %
Platelets: 313 10*3/uL (ref 150–400)
RBC: 3.44 MIL/uL — ABNORMAL LOW (ref 3.87–5.11)
RDW: 16.5 % — ABNORMAL HIGH (ref 11.5–15.5)
WBC: 26.5 10*3/uL — ABNORMAL HIGH (ref 4.0–10.5)
nRBC: 0 % (ref 0.0–0.2)

## 2018-01-29 LAB — BLOOD GAS, VENOUS
Acid-Base Excess: 10.8 mmol/L — ABNORMAL HIGH (ref 0.0–2.0)
Acid-Base Excess: 10.4 mmol/L — ABNORMAL HIGH (ref 0.0–2.0)
Acid-Base Excess: 8.7 mmol/L — ABNORMAL HIGH (ref 0.0–2.0)
Bicarbonate: 32 mmol/L — ABNORMAL HIGH (ref 20.0–28.0)
Bicarbonate: 31.3 mmol/L — ABNORMAL HIGH (ref 20.0–28.0)
Bicarbonate: 31.9 mmol/L — ABNORMAL HIGH (ref 20.0–28.0)
DELIVERY SYSTEMS: POSITIVE
FIO2: 100
FIO2: 40
O2 Content: 4 L/min
O2 Saturation: 82.7 %
O2 Saturation: 69.3 %
O2 Saturation: 97.9 %
PO2 VEN: 183 mmHg — AB (ref 32.0–45.0)
Patient temperature: 37
Patient temperature: 36.8
Patient temperature: 37
pCO2, Ven: 88.9 mmHg (ref 44.0–60.0)
pCO2, Ven: 96.6 mmHg (ref 44.0–60.0)
pCO2, Ven: 55 mmHg (ref 44.0–60.0)
pH, Ven: 7.254 (ref 7.250–7.430)
pH, Ven: 7.22 — ABNORMAL LOW (ref 7.250–7.430)
pH, Ven: 7.403 (ref 7.250–7.430)
pO2, Ven: 51.6 mmHg — ABNORMAL HIGH (ref 32.0–45.0)
pO2, Ven: 41.1 mmHg (ref 32.0–45.0)

## 2018-01-29 LAB — TROPONIN I
Troponin I: 0.04 ng/mL (ref <0.03)
Troponin I: 0.05 ng/mL (ref <0.03)
Troponin I: 0.05 ng/mL (ref <0.03)
Troponin I: 0.09 ng/mL (ref <0.03)

## 2018-01-29 LAB — COMPREHENSIVE METABOLIC PANEL
ALT: 37 U/L (ref 0–44)
AST: 31 U/L (ref 15–41)
Albumin: 2.8 g/dL — ABNORMAL LOW (ref 3.5–5.0)
Alkaline Phosphatase: 94 U/L (ref 38–126)
Anion gap: 7 (ref 5–15)
BUN: 21 mg/dL (ref 8–23)
CO2: 38 mmol/L — ABNORMAL HIGH (ref 22–32)
Calcium: 8.4 mg/dL — ABNORMAL LOW (ref 8.9–10.3)
Chloride: 93 mmol/L — ABNORMAL LOW (ref 98–111)
Creatinine, Ser: 0.94 mg/dL (ref 0.44–1.00)
GFR calc Af Amer: >60 mL/min (ref >60)
GFR calc non Af Amer: >60 mL/min (ref >60)
Glucose, Bld: 141 mg/dL — ABNORMAL HIGH (ref 70–99)
Potassium: 4 mmol/L (ref 3.5–5.1)
Sodium: 138 mmol/L (ref 135–145)
Total Bilirubin: 0.3 mg/dL (ref 0.3–1.2)
Total Protein: 6.8 g/dL (ref 6.5–8.1)

## 2018-01-29 LAB — PROCALCITONIN: Procalcitonin: 0.17 ng/mL

## 2018-01-29 LAB — CBG MONITORING, ED: Glucose-Capillary: 121 mg/dL — ABNORMAL HIGH (ref 70–99)

## 2018-01-29 LAB — BRAIN NATRIURETIC PEPTIDE: B Natriuretic Peptide: 161 pg/mL — ABNORMAL HIGH (ref 0.0–100.0)

## 2018-01-29 LAB — PROTIME-INR
INR: 1.64
PROTHROMBIN TIME: 19.2 s — AB (ref 11.4–15.2)

## 2018-01-29 LAB — MRSA PCR SCREENING: MRSA by PCR: NEGATIVE

## 2018-01-29 MED ORDER — SENNOSIDES-DOCUSATE SODIUM 8.6-50 MG PO TABS
2.0000 | ORAL_TABLET | Freq: Two times a day (BID) | ORAL | Status: DC
  Administered 2018-01-29 – 2018-02-01 (×7): 2 via ORAL
  Filled 2018-01-29 (×7): qty 2

## 2018-01-29 MED ORDER — FERROUS SULFATE 325 (65 FE) MG PO TABS
325.0000 mg | ORAL_TABLET | Freq: Every day | ORAL | Status: DC
  Administered 2018-01-29 – 2018-02-01 (×4): 325 mg via ORAL
  Filled 2018-01-29 (×4): qty 1

## 2018-01-29 MED ORDER — METHYLPREDNISOLONE SODIUM SUCC 125 MG IJ SOLR
60.0000 mg | Freq: Four times a day (QID) | INTRAMUSCULAR | Status: DC
  Administered 2018-01-29 – 2018-02-01 (×11): 60 mg via INTRAVENOUS
  Filled 2018-01-29 (×11): qty 2

## 2018-01-29 MED ORDER — ACETAMINOPHEN 325 MG PO TABS
650.0000 mg | ORAL_TABLET | Freq: Four times a day (QID) | ORAL | Status: DC | PRN
  Administered 2018-02-01: 650 mg via ORAL
  Filled 2018-01-29: qty 2

## 2018-01-29 MED ORDER — POLYETHYLENE GLYCOL 3350 17 G PO PACK
17.0000 g | PACK | Freq: Every day | ORAL | Status: DC
  Administered 2018-01-29 – 2018-02-01 (×4): 17 g via ORAL
  Filled 2018-01-29 (×4): qty 1

## 2018-01-29 MED ORDER — VANCOMYCIN HCL 10 G IV SOLR
1500.0000 mg | Freq: Once | INTRAVENOUS | Status: AC
  Administered 2018-01-29: 1500 mg via INTRAVENOUS
  Filled 2018-01-29 (×2): qty 1500

## 2018-01-29 MED ORDER — BENZONATATE 100 MG PO CAPS
100.0000 mg | ORAL_CAPSULE | Freq: Two times a day (BID) | ORAL | Status: DC | PRN
  Administered 2018-01-29 – 2018-01-31 (×2): 100 mg via ORAL
  Filled 2018-01-29 (×2): qty 1

## 2018-01-29 MED ORDER — LEVOTHYROXINE SODIUM 25 MCG PO TABS
125.0000 ug | ORAL_TABLET | Freq: Every day | ORAL | Status: DC
  Administered 2018-01-29 – 2018-02-01 (×4): 125 ug via ORAL
  Filled 2018-01-29 (×4): qty 1

## 2018-01-29 MED ORDER — SODIUM CHLORIDE 0.9 % IV SOLN
1.0000 g | Freq: Once | INTRAVENOUS | Status: AC
  Administered 2018-01-29: 1 g via INTRAVENOUS
  Filled 2018-01-29: qty 1

## 2018-01-29 MED ORDER — LEVOFLOXACIN 500 MG PO TABS: 500.0000 mg | ORAL_TABLET | Freq: Once | ORAL | Status: DC

## 2018-01-29 MED ORDER — ONDANSETRON HCL 4 MG/2ML IJ SOLN: 4.0000 mg | Freq: Four times a day (QID) | INTRAMUSCULAR | Status: DC | PRN

## 2018-01-29 MED ORDER — OMEGA-3-ACID ETHYL ESTERS 1 G PO CAPS
1.0000 | ORAL_CAPSULE | Freq: Two times a day (BID) | ORAL | Status: DC
  Administered 2018-01-29 – 2018-02-01 (×7): 1 g via ORAL
  Filled 2018-01-29 (×7): qty 1

## 2018-01-29 MED ORDER — WARFARIN SODIUM 5 MG PO TABS
6.0000 mg | ORAL_TABLET | Freq: Once | ORAL | Status: AC
  Administered 2018-01-29: 6 mg via ORAL
  Filled 2018-01-29 (×2): qty 1

## 2018-01-29 MED ORDER — WARFARIN - PHARMACIST DOSING INPATIENT
Freq: Every day | Status: DC
  Administered 2018-01-29 – 2018-01-31 (×3)

## 2018-01-29 MED ORDER — ACETAMINOPHEN 650 MG RE SUPP: 650.0000 mg | Freq: Four times a day (QID) | RECTAL | Status: DC | PRN

## 2018-01-29 MED ORDER — TICAGRELOR 60 MG PO TABS
60.0000 mg | ORAL_TABLET | Freq: Two times a day (BID) | ORAL | Status: DC
  Administered 2018-01-29 – 2018-02-01 (×7): 60 mg via ORAL
  Filled 2018-01-29 (×13): qty 1

## 2018-01-29 MED ORDER — MAGNESIUM HYDROXIDE 400 MG/5ML PO SUSP: 30.0000 mL | Freq: Every day | ORAL | Status: DC | PRN

## 2018-01-29 MED ORDER — PROPOFOL 1000 MG/100ML IV EMUL
INTRAVENOUS | Status: AC
  Filled 2018-01-29: qty 100

## 2018-01-29 MED ORDER — BACLOFEN 10 MG PO TABS
10.0000 mg | ORAL_TABLET | Freq: Three times a day (TID) | ORAL | Status: DC
  Administered 2018-01-29 – 2018-02-01 (×10): 10 mg via ORAL
  Filled 2018-01-29 (×10): qty 1

## 2018-01-29 MED ORDER — TRAZODONE HCL 50 MG PO TABS
50.0000 mg | ORAL_TABLET | Freq: Every evening | ORAL | Status: DC | PRN
  Administered 2018-01-30 – 2018-01-31 (×2): 50 mg via ORAL
  Filled 2018-01-29 (×2): qty 1

## 2018-01-29 MED ORDER — IPRATROPIUM-ALBUTEROL 0.5-2.5 (3) MG/3ML IN SOLN
3.0000 mL | Freq: Four times a day (QID) | RESPIRATORY_TRACT | Status: DC
  Administered 2018-01-29 – 2018-02-01 (×12): 3 mL via RESPIRATORY_TRACT
  Filled 2018-01-29 (×13): qty 3

## 2018-01-29 MED ORDER — NICOTINE 14 MG/24HR TD PT24
14.0000 mg | MEDICATED_PATCH | Freq: Every day | TRANSDERMAL | Status: DC
  Administered 2018-01-29 – 2018-01-30 (×2): 14 mg via TRANSDERMAL
  Filled 2018-01-29 (×4): qty 1

## 2018-01-29 MED ORDER — HYDROCODONE-ACETAMINOPHEN 5-325 MG PO TABS
1.0000 | ORAL_TABLET | Freq: Three times a day (TID) | ORAL | Status: DC | PRN
  Administered 2018-01-29 – 2018-02-01 (×4): 1 via ORAL
  Filled 2018-01-29 (×4): qty 1

## 2018-01-29 MED ORDER — METHYLPREDNISOLONE SODIUM SUCC 40 MG IJ SOLR: 40.0000 mg | Freq: Two times a day (BID) | INTRAMUSCULAR | Status: DC

## 2018-01-29 MED ORDER — ONDANSETRON HCL 4 MG PO TABS: 4.0000 mg | ORAL_TABLET | Freq: Four times a day (QID) | ORAL | Status: DC | PRN

## 2018-01-29 MED ORDER — FENTANYL 50 MCG/HR TD PT72
1.0000 | MEDICATED_PATCH | TRANSDERMAL | Status: DC
  Administered 2018-01-29 – 2018-02-01 (×2): 1 via TRANSDERMAL
  Filled 2018-01-29 (×2): qty 1

## 2018-01-29 MED ORDER — NITROGLYCERIN 0.4 MG SL SUBL: 0.4000 mg | SUBLINGUAL_TABLET | SUBLINGUAL | Status: DC | PRN

## 2018-01-29 MED ORDER — BUDESONIDE 0.5 MG/2ML IN SUSP
0.5000 mg | Freq: Two times a day (BID) | RESPIRATORY_TRACT | Status: DC
  Administered 2018-01-29 – 2018-01-30 (×2): 0.5 mg via RESPIRATORY_TRACT
  Filled 2018-01-29 (×2): qty 2

## 2018-01-29 MED ORDER — LEVOFLOXACIN 500 MG PO TABS
500.0000 mg | ORAL_TABLET | Freq: Every day | ORAL | Status: DC
  Administered 2018-01-29 – 2018-02-01 (×4): 500 mg via ORAL
  Filled 2018-01-29 (×4): qty 1

## 2018-01-29 MED ORDER — ARFORMOTEROL TARTRATE 15 MCG/2ML IN NEBU
15.0000 ug | INHALATION_SOLUTION | Freq: Two times a day (BID) | RESPIRATORY_TRACT | Status: DC
  Administered 2018-01-29 – 2018-01-30 (×2): 15 ug via RESPIRATORY_TRACT
  Filled 2018-01-29 (×2): qty 2

## 2018-01-29 MED ORDER — ALBUTEROL SULFATE (2.5 MG/3ML) 0.083% IN NEBU: 2.5000 mg | INHALATION_SOLUTION | RESPIRATORY_TRACT | Status: DC | PRN

## 2018-01-29 NOTE — Progress Notes (Signed)
Nutrition Brief Note  Patient screened as part of the COPD gold order set.   On first encounter, pt deep asleep with untouched lunch tray in front of her. On second arrival, pt up in bed and had just finished eating 100% of her tray. Even though she is alert, she would not vocally respond to RD, answering all questions with gestures and head nods.   She denies n/v/c/d. She shrugs when asked about her appetite or weight. Bed weight today is 170 lbs. Per chart, she has gained 10-15 lbs since last Spring.   She declined any tray additions or supplements. She voices no concerns.   Patient eating 100% of meals at this time. Has gained wt over past year. No nutrition interventions warranted at this time. If nutrition issues arise, please consult RD.   Burtis Junes RD, LDN, CNSC Clinical Nutrition Available Tues-Sat via Pager: [phone] 01/29/2018 3:52 PM

## 2018-01-29 NOTE — H&P (Signed)
H&P        History and Physical    [name] YHC:[id number] DOB: Mar 21, 1950 DOA: 01/28/2018  PCP: [name], NP  Patient coming from: SNF  I have personally briefly reviewed patient's old medical records in [place]  Chief Complaint: Shortness of breath  HPI: [name] is a 68 y.o. female with medical history significant of chronic respiratory failure, COPD, hypercoagulable state presents with shortness of breath.  Patient was admitted on January 16 and discharged to SNF.  Today she became acutely  short of breath.  They gave her some nebulizers there, she tried her flutter valve she was also given some p.o. Levaquin.[name]  Her cough is worsened.  She has been on a Medrol Dosepak as well there.  She continued to worsen so he brought her to the ED.  There was a mention that she might of been more confused than usual.  She was hypercapnic here.  She has been on BiPAP for many hours.  And finally she is no longer acidotic with an improved CO2.  At baseline she is on 3 L oxygen.  Patient had no acute infiltrate on her chest x-ray.  ED Course: Patient was given nebulizers, IV steroids, IV cefepime and vancomycin and was placed on BiPAP.  Finally her acidosis is improved and patient's mentation is improved.  Review of Systems: Positive for shortness of breath, positive for cough with non-productive sputum in her chest, nausea vomiting x1, back pain, all others reviewed with patient  and are  negative unless otherwise stated Past Medical History:  Diagnosis Date  . Acute MI ([place])    x4, code blue x3  . Arteriosclerotic cardiovascular disease (ASCVD) 2002   Inf STEMI-2002. 2003-cutting balloon + brachytherapy for restenosis; subsequent acute stent thrombosis 06/2010 requiring 2 separate interventions (Zeta stent, then repeat cath with thrombectomy). focal basal inf AK, nl EF; 03/2011: Patent stents, minor nonobst  residual dz, nl EF; neg stress nuclear in 2008 and stress echo in  2009  . Chronic anticoagulation    Warfarin plus ticagrelor  . Chronic respiratory failure ([place]) 08/27/2013   On 2L 02  . COPD (chronic obstructive pulmonary disease) ([place])    02 dependent  . COPD (chronic obstructive pulmonary disease) ([place])   . Factor 5 Leiden mutation, heterozygous ([place])   . Factor V Leiden, prothrombin gene mutation ([place]) 2006  . Hyperlipidemia   . Hypothyroidism   . Noncompliance   . Pelvic fracture ([place]) 2009  . Pulmonary embolism ([place]) 2006   Associated with deep vein thrombosis-2006; + factor V Leiden  . Tobacco abuse    50 pack years    Past Surgical History:  Procedure Laterality Date  . COLONOSCOPY  Approximately 2000   Negative screening study  . CORONARY ANGIOPLASTY  2002, 2003, 2012  . LEFT AND RIGHT HEART CATHETERIZATION WITH CORONARY ANGIOGRAM N/A 04/03/2011   Procedure: LEFT AND RIGHT HEART CATHETERIZATION WITH CORONARY ANGIOGRAM;  Surgeon: Sherren Mocha, MD;  Location[facility];  Service: Cardiovascular;  Laterality: N/A;     reports that she has been smoking cigarettes. She started smoking about 49 years ago. She has a 25.00 pack-year smoking history. She has never used smokeless tobacco. She reports that she does not drink alcohol or use drugs.  Allergies  Allergen Reactions  . Dilaudid [Hydromorphone Hcl] Hives and Nausea Only  . Minocycline Hcl     REACTION: Dizzy  . Prednisone     REACTION: feels like throat swelling, hallucinations  .  Varenicline Tartrate     REACTION: Dizzy(chantix)   . Zocor [Simvastatin - High Dose] Other (See Comments)    myalgia    Family History  Problem Relation Age of Onset  . Emphysema Mother   . Alzheimer's disease Mother   . Emphysema Sister   . Heart disease Father   . Factor V Leiden deficiency Father   . Factor V Leiden deficiency Sister   . Factor V Leiden deficiency Daughter   . Kidney cancer Paternal Grandmother      Prior to Admission medications   Medication Sig Start Date End  Date Taking? Authorizing Provider  albuterol (PROVENTIL) (2.5 MG/3ML) 0.083% nebulizer solution USE 1 VIAL BY NEBULIZER EVERY 4 HOURS AS NEEDED FOR WHEEZING. DX: J44.9 Patient taking differently: Take 2.5 mg by nebulization every 4 (four) hours as needed for wheezing or shortness of breath.  01/24/17  Yes Magdalen Spatz, NP  arformoterol (BROVANA) 15 MCG/2ML NEBU Take 2 mLs (15 mcg total) by nebulization 2 (two) times daily. 09/07/17  Yes Juanito Doom, MD  baclofen (LIORESAL) 10 MG tablet Take 1 tablet (10 mg total) by mouth 3 (three) times daily. 01/24/18  Yes Lavina Hamman, MD  benzonatate (TESSALON) 100 MG capsule Take 1 capsule (100 mg total) by mouth 2 (two) times daily as needed for cough. 08/30/17  Yes Nafziger, Tommi Rumps, NP  budesonide (PULMICORT) 0.5 MG/2ML nebulizer solution Take 2 mLs (0.5 mg total) by nebulization 2 (two) times daily. Dx: J43.9 01/24/17  Yes Magdalen Spatz, NP  fentaNYL (DURAGESIC) 50 MCG/HR Place 1 patch onto the skin every 3 (three) days. 01/24/18  Yes Lassen, Arlo C, PA-C  ferrous sulfate 325 (65 FE) MG tablet Take 325 mg by mouth daily with breakfast.   Yes [provider]  HYDROcodone-acetaminophen (NORCO/VICODIN) 5-325 MG tablet Take 1 tablet by mouth every 8 (eight) hours as needed for up to 5 days for moderate pain. 01/24/18 01/29/18 Yes Lassen, Arlo C, PA-C  ipratropium-albuterol (DUONEB) 0.5-2.5 (3) MG/3ML SOLN Take 3 mLs by nebulization 4 (four) times daily. 03/20/17  Yes Magdalen Spatz, NP  levofloxacin (LEVAQUIN) 500 MG tablet Take 500 mg by mouth once.   Yes [provider]  levothyroxine (SYNTHROID, LEVOTHROID) 125 MCG tablet Take 1 tablet (125 mcg total) by mouth daily. 08/16/17  Yes Nafziger, Tommi Rumps, NP  lidocaine (LIDODERM) 5 % Place 1 patch onto the skin daily. Remove & Discard patch within 12 hours or as directed by MD 01/24/18  Yes Lavina Hamman, MD  methylPREDNISolone (MEDROL DOSEPAK) 4 MG TBPK tablet Day 1: 8 mg PO before breakfast, 4 mg  after lunch and after dinner, and 8 mg at bedtime Day 2: 4 mg PO before breakfast, after lunch, and after dinner and 8 mg at bedtime Day 3: 4 mg PO before breakfast, after lunch, after dinner, and at bedtime Day 4: 4 mg PO before breakfast, after lunch, and at bedtime Day 5: 4 mg PO before breakfast and at bedtime Day 6: 4 mg PO before breakfast Patient taking differently: Take 4 mg by mouth See admin instructions. Day 1: 8 mg PO before breakfast, 4 mg after lunch and after dinner, and 8 mg at bedtime Day 2: 4 mg PO before breakfast, after lunch, and after dinner and 8 mg at bedtime Day 3: 4 mg PO before breakfast, after lunch, after dinner, and at bedtime Day 4: 4 mg PO before breakfast, after lunch, and at bedtime Day 5: 4 mg PO before  breakfast and at bedtime Day 6: 4 mg PO before breakfast 01/24/18  Yes Lavina Hamman, MD  nicotine (NICODERM CQ - DOSED IN MG/24 HOURS) 14 mg/24hr patch Place 1 patch (14 mg total) onto the skin daily. 01/25/18  Yes Lavina Hamman, MD  Omega-3 Fatty Acids (FISH OIL) 600 MG CAPS Take 1 capsule by mouth 3 (three) times daily.   Yes [provider]  OXYGEN Inhale 3 L into the lungs continuous.    Yes [provider]  polyethylene glycol (MIRALAX / GLYCOLAX) packet Take 17 g by mouth daily. 01/25/18  Yes Lavina Hamman, MD  Respiratory Therapy Supplies (FLUTTER) DEVI Use as directed 05/14/15  Yes Javier Glazier, MD  senna-docusate (SENOKOT-S) 8.6-50 MG tablet Take 2 tablets by mouth 2 (two) times daily. 01/24/18  Yes Lavina Hamman, MD  ticagrelor (BRILINTA) 60 MG TABS tablet TAKE 1 TABLET(60 MG) BY MOUTH TWICE DAILY Patient taking differently: Take 60 mg by mouth 2 (two) times daily.  10/05/17  Yes Herminio Commons, MD  traZODone (DESYREL) 50 MG tablet Take 50 mg by mouth at bedtime as needed for sleep.   Yes [provider]  warfarin (COUMADIN) 1 MG tablet Take 0.5 mg by mouth See admin instructions. Give 0.5 mg by mouth along with  5 mg to = 5.5 mg once a day    Yes [provider]  warfarin (COUMADIN) 5 MG tablet Take 5 mg by mouth See admin instructions. Give 5 mg by mouth along with 0.5 mg to = 5.5 mg once a day    Yes [provider]  nitroGLYCERIN (NITROSTAT) 0.4 MG SL tablet Place 1 tablet (0.4 mg total) under the tongue every 5 (five) minutes as needed for chest pain. 10/28/14   Herminio Commons, MD    Physical Exam: Vitals:   01/29/18 0400 01/29/18 0500 01/29/18 0530 01/29/18 0600  BP: (!) 128/53 (!) 113/54 (!) 112/55 120/60  Pulse:  (!) 102 95 (!) 105  Resp:      Temp:      TempSrc:      SpO2:  99% 99% 99%  Weight:      Height:        Constitutional: NAD, calm, comfortable Vitals:   01/29/18 0400 01/29/18 0500 01/29/18 0530 01/29/18 0600  BP: (!) 128/53 (!) 113/54 (!) 112/55 120/60  Pulse:  (!) 102 95 (!) 105  Resp:      Temp:      TempSrc:      SpO2:  99% 99% 99%  Weight:      Height:       Eyes: , lids and conjunctivae normal ENMT: On BiPAP  Neck: normal, supple,  Respiratory: Decreased breath sounds throughout  Normal respiratory effort. No accessory muscle use.  Cardiovascular: Regular rate and rhythm, 1+ extremity edema.  Abdomen: no tenderness, no masses palpated. Morbid obese. Bowel sounds positive.  Musculoskeletal: no clubbing / cyanosis.  Skin: Lower extremities chronic venous stasis changes  Neurologic: CN 2-12 grossly intact , decreased hearing.  Psychiatric: Normal judgment and insight. Alert and oriented x 3. Normal mood.    Labs on Admission: I have personally reviewed following labs and imaging studies  CBC: Recent Labs  Lab 01/24/18 0233 01/25/18 0700 01/28/18 0644 01/28/18 2330  WBC 15.4* 16.7* 24.7* 26.5*  NEUTROABS  --  13.1* 19.9* 20.9*  HGB 9.8* 11.8* 10.1* 9.9*  HCT 31.0* 40.4 34.7* 33.9*  MCV 94.5 99.5 100.0 98.5  PLT 216  170 285 939   Basic Metabolic Panel: Recent Labs  Lab 01/24/18 0233 01/25/18 0700 01/28/18 0644  01/28/18 2330  NA 138 137 139 138  K 3.7 4.2 4.5 4.0  CL 97* 95* 94* 93*  CO2 34* 33* 38* 38*  GLUCOSE 137* 89 85 141*  BUN 17 20 20 21   CREATININE 0.82 0.75 0.81 0.94  CALCIUM 8.0* 8.6* 8.7* 8.4*  MG 1.8  --   --   --    GFR: Estimated Creatinine Clearance: 57.4 mL/min (by C-G formula based on SCr of 0.94 mg/dL). Liver Function Tests: Recent Labs  Lab 01/28/18 2330  AST 31  ALT 37  ALKPHOS 94  BILITOT 0.3  PROT 6.8  ALBUMIN 2.8*   No results for input(s): LIPASE, AMYLASE in the last 168 hours. No results for input(s): AMMONIA in the last 168 hours. Coagulation Profile: Recent Labs  Lab 01/23/18 0238 01/24/18 0233 01/25/18 0700 01/26/18 0410 01/28/18 2330  INR 1.83 2.26 2.51 3.08 1.64   Cardiac Enzymes: Recent Labs  Lab 01/28/18 2330  TROPONINI 0.09*   BNP (last 3 results) No results for input(s): PROBNP in the last 8760 hours. HbA1C: No results for input(s): HGBA1C in the last 72 hours. CBG: Recent Labs  Lab 01/23/18 1129 01/23/18 1823 01/23/18 2133 01/24/18 0738 01/24/18 1205  GLUCAP 144* 157* 95 116* 97   Lipid Profile: No results for input(s): CHOL, HDL, LDLCALC, TRIG, CHOLHDL, LDLDIRECT in the last 72 hours. Thyroid Function Tests: No results for input(s): TSH, T4TOTAL, FREET4, T3FREE, THYROIDAB in the last 72 hours. Anemia Panel: No results for input(s): VITAMINB12, FOLATE, FERRITIN, TIBC, IRON, RETICCTPCT in the last 72 hours. Urine analysis:    Component Value Date/Time   COLORURINE YELLOW 01/19/2018 2130   APPEARANCEUR HAZY (A) 01/19/2018 2130   LABSPEC 1.019 01/19/2018 2130   PHURINE 6.0 01/19/2018 2130   GLUCOSEU NEGATIVE 01/19/2018 2130   HGBUR LARGE (A) 01/19/2018 2130   BILIRUBINUR NEGATIVE 01/19/2018 2130   BILIRUBINUR n 09/21/2016 [place] 01/19/2018 2130   PROTEINUR 100 (A) 01/19/2018 2130   UROBILINOGEN 0.2 09/21/2016 1432   UROBILINOGEN 0.2 04/13/2009 2025   NITRITE NEGATIVE 01/19/2018 2130    LEUKOCYTESUR NEGATIVE 01/19/2018 2130    Radiological Exams on Admission: Dg Chest 2 View  Result Date: 01/28/2018 CLINICAL DATA:  Shortness of breath EXAM: CHEST - 2 VIEW COMPARISON:  01/20/2017, 09/19/2017, CT chest 11/14/2017 FINDINGS: CP angle blunting/scarring is unchanged. Posterior lower lung spiculation, corresponding to CT demonstrated scarring. Emphysematous disease. Diffuse increased interstitial opacity right greater than left consistent with interstitial inflammatory process or edema. Stable cardiomediastinal silhouette. No pneumothorax. IMPRESSION: 1. Stable CP angle scarring and left lower lobe posterior pleural and parenchymal scarring 2. Emphysematous disease 3. Interval diffuse interstitial opacity right greater than left which may reflect interstitial inflammatory process or pulmonary edema Electronically Signed   By: Donavan Foil M.D.   On: 01/28/2018 23:04     Assessment/Plan Active Problems:   Factor V Leiden, prothrombin gene mutation (HCC)   Chronic anticoagulation   COPD exacerbation (HCC)   Acute respiratory failure (HCC)   Vertebral fracture, osteoporotic ([place]) Present before admit   Acute on chronic respiratory failure with hypoxia and hypercapnia (HCC) CAD Leukocytosis Tobacco abuse   Bronchodilators, steroids, empiric antibiotics, no acute infiltrate on chest x-ray.  Leukocytosis likely from demargination from steroid usage.  Pro-Calcitonin pending Ween bipap /supplemental oxygen as tolerated to achieve O2 sats greater than 90% Continue nicotine patch ,advised continued cessation  Continue Coumadin per protocol Continue home pain regimen Continue medical management for history of CAD, no chest pain, cycle cardiac enzymes, repeat EKG this morning,,   DVT prophylaxis: On Coumadin continue  Code Status: Full Disposition Plan: Return to SNF 2 days  Admission status: Inpatient stepdown unit  It is my clinical opinion that admission to INPATIENT is  reasonable and necessary because of the expectation that this patient will require hospital care that crosses at least 2 midnights to treat this condition based on the medical complexity of the problems presented.     Shelbie Proctor MD [facility] Pager [phone]  If 7PM-7AM, please contact night-coverage [email or web address] Password [facility]  01/29/2018, 6:53 AM

## 2018-01-29 NOTE — ED Notes (Addendum)
Pts Sats dropped to 84% on O2 during ambulation.  Pt wheeled back to bed by PT. Sent Dr Wynetta Emery text.  Pt is in room eating and talking at this time.

## 2018-01-29 NOTE — Progress Notes (Signed)
ANTICOAGULATION CONSULT NOTE - Initial Consult  Pharmacy Consult for warfarin Indication: VTE treatment  Allergies  Allergen Reactions  . Dilaudid [Hydromorphone Hcl] Hives and Nausea Only  . Minocycline Hcl     REACTION: Dizzy  . Prednisone     REACTION: feels like throat swelling, hallucinations  . Varenicline Tartrate     REACTION: Dizzy(chantix)   . Zocor [Simvastatin - High Dose] Other (See Comments)    myalgia    Patient Measurements: Height: 5\' 4"  (162.6 cm) Weight: 164 lb 3.9 oz (74.5 kg) IBW/kg (Calculated) : 54.7   Vital Signs: Temp: 98.3 F (36.8 C) (01/21 0040) Temp Source: Oral (01/21 0040) BP: 120/60 (01/21 0600) Pulse Rate: 105 (01/21 0600)  Labs: Recent Labs    01/28/18 0644 01/28/18 2330 01/29/18 0659  HGB 10.1* 9.9*  --   HCT 34.7* 33.9*  --   PLT 285 313  --   LABPROT  --  19.2*  --   INR  --  1.64  --   CREATININE 0.81 0.94  --   TROPONINI  --  0.09* 0.05*    Estimated Creatinine Clearance: 57.4 mL/min (by C-G formula based on SCr of 0.94 mg/dL).   Medical History: Past Medical History:  Diagnosis Date  . Acute MI ([place])    x4, code blue x3  . Arteriosclerotic cardiovascular disease (ASCVD) 2002   Inf STEMI-2002. 2003-cutting balloon + brachytherapy for restenosis; subsequent acute stent thrombosis 06/2010 requiring 2 separate interventions (Zeta stent, then repeat cath with thrombectomy). focal basal inf AK, nl EF; 03/2011: Patent stents, minor nonobst  residual dz, nl EF; neg stress nuclear in 2008 and stress echo in 2009  . Chronic anticoagulation    Warfarin plus ticagrelor  . Chronic respiratory failure ([place]) 08/27/2013   On 2L 02  . COPD (chronic obstructive pulmonary disease) ([place])    02 dependent  . COPD (chronic obstructive pulmonary disease) ([place])   . Factor 5 Leiden mutation, heterozygous ([place])   . Factor V Leiden, prothrombin gene mutation ([place]) 2006  . Hyperlipidemia   . Hypothyroidism   . Noncompliance   . Pelvic  fracture ([place]) 2009  . Pulmonary embolism ([place]) 2006   Associated with deep vein thrombosis-2006; + factor V Leiden  . Tobacco abuse    50 pack years    Medications:  (Not in a hospital admission)   Assessment: Pharmacy consulted to dose warfarin in patient with history of pulmonary embolism/DVT.  INR on admission is 1.64.  Home dose of warfarin listed as 5.5 mg daily.  Goal of Therapy:  INR 2-3 Monitor platelets by anticoagulation protocol: Yes   Plan:  Warfarin 6 mg x 1 dose. Monitor daily INR and s/s of bleeding  Revonda Standard Clotiel Troop 01/29/2018,7:41 AM

## 2018-01-29 NOTE — ED Notes (Signed)
Pt states she hasn't walked since PT at the facility Friday 01/25/2018.

## 2018-01-29 NOTE — ED Provider Notes (Signed)
[facility] EMERGENCY DEPARTMENT Provider Note   CSN: [id number] Arrival date & time: 01/28/18  2227  Time seen 11:10 PM   History   Chief Complaint Chief Complaint  Patient presents with  . Shortness of Breath   Level 5 caveat for respiratory distress.  HPI [name] is a 68 y.o. female.  HPI patient has a history of COPD and was recently admitted on January 11 for a COPD exacerbation after a fall at home sustaining a L3 compression fracture with 30% loss of height from a fall.  She had been at home however after that admission she was discharged on January 16 to local nursing facility for rehab.  She is documented to continue to smoke and she is normally on 2 to 3 L of oxygen at home.  She states she got short of breath tonight, she does not know when however the EMS states they were told 9 PM.  She states they gave her nebulizer x3 at the nursing facility without relief.  She states she has minimal cough.  Most the history obtained from friend at bedside, when I enter the room patient states "what do YOU want".  She states she only has [employer] account planner to manage her pulmonary and cardiac problems.  Patient is on Coumadin for factor V Leiden and history of PE.  PCP [name], NP   Past Medical History:  Diagnosis Date  . Acute MI ([place])    x4, code blue x3  . Arteriosclerotic cardiovascular disease (ASCVD) 2002   Inf STEMI-2002. 2003-cutting balloon + brachytherapy for restenosis; subsequent acute stent thrombosis 06/2010 requiring 2 separate interventions (Zeta stent, then repeat cath with thrombectomy). focal basal inf AK, nl EF; 03/2011: Patent stents, minor nonobst  residual dz, nl EF; neg stress nuclear in 2008 and stress echo in 2009  . Chronic anticoagulation    Warfarin plus ticagrelor  . Chronic respiratory failure ([place]) 08/27/2013   On 2L 02  . COPD (chronic obstructive pulmonary disease) ([place])    02 dependent  . COPD (chronic obstructive pulmonary disease)  ([place])   . Factor 5 Leiden mutation, heterozygous ([place])   . Factor V Leiden, prothrombin gene mutation ([place]) 2006  . Hyperlipidemia   . Hypothyroidism   . Noncompliance   . Pelvic fracture ([place]) 2009  . Pulmonary embolism ([place]) 2006   Associated with deep vein thrombosis-2006; + factor V Leiden  . Tobacco abuse    50 pack years    Patient Active Problem List   Diagnosis Date Noted  . Hyperglycemia 01/21/2018  . COPD with exacerbation ([place]) 01/21/2018  . Vertebral fracture, osteoporotic ([place]) 01/20/2018  . Rectal bleeding 09/05/2017  . Left lower quadrant pain 09/05/2017  . Oxygen dependent 09/05/2017  . Chronic back pain 05/16/2017  . COPD exacerbation ([place]) 02/19/2017  . Tachycardia 02/19/2017  . Influenza A 02/19/2017  . Acute respiratory failure ([place]) 02/19/2017  . Dyspnea 11/26/2014  . Loss of weight 11/26/2014  . Chronic respiratory failure ([place]) 08/27/2013  . Chest pain 08/24/2013  . Encounter for therapeutic drug monitoring 02/13/2013  . Abnormal weight loss 03/21/2012  . Cerebrovascular disease 11/15/2011  . CAD S/P percutaneous coronary angioplasty   . Factor V Leiden, prothrombin gene mutation ([place])   . Hyperlipidemia   . Chronic anticoagulation   . Hypothyroidism 11/23/2006  . TOBACCO ABUSE 11/23/2006  . History of pulmonary embolus (PE) 11/23/2006  . 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 COLONOSCOPY  Approximately 2000   Negative screening study  . CORONARY ANGIOPLASTY  2002, 2003, 2012  . LEFT AND RIGHT HEART CATHETERIZATION WITH CORONARY ANGIOGRAM N/A 04/03/2011   Procedure: LEFT AND RIGHT HEART CATHETERIZATION WITH CORONARY ANGIOGRAM;  Surgeon: Sherren Mocha, MD;  Location[facility] C[facility] LAB;  Service: Cardiovascular;  Laterality: N/A;     OB History   No obstetric history on file.      Home Medications    Prior to Admission medications   Medication Sig  Start Date End Date Taking? Authorizing Provider  albuterol (PROVENTIL) (2.5 MG/3ML) 0.083% nebulizer solution USE 1 VIAL BY NEBULIZER EVERY 4 HOURS AS NEEDED FOR WHEEZING. DX: J44.9 Patient taking differently: Take 2.5 mg by nebulization every 4 (four) hours as needed for wheezing or shortness of breath.  01/24/17  Yes Magdalen Spatz, NP  arformoterol (BROVANA) 15 MCG/2ML NEBU Take 2 mLs (15 mcg total) by nebulization 2 (two) times daily. 09/07/17  Yes Juanito Doom, MD  baclofen (LIORESAL) 10 MG tablet Take 1 tablet (10 mg total) by mouth 3 (three) times daily. 01/24/18  Yes Lavina Hamman, MD  benzonatate (TESSALON) 100 MG capsule Take 1 capsule (100 mg total) by mouth 2 (two) times daily as needed for cough. 08/30/17  Yes Nafziger, Tommi Rumps, NP  budesonide (PULMICORT) 0.5 MG/2ML nebulizer solution Take 2 mLs (0.5 mg total) by nebulization 2 (two) times daily. Dx: J43.9 01/24/17  Yes Magdalen Spatz, NP  fentaNYL (DURAGESIC) 50 MCG/HR Place 1 patch onto the skin every 3 (three) days. 01/24/18  Yes Lassen, Arlo C, PA-C  ferrous sulfate 325 (65 FE) MG tablet Take 325 mg by mouth daily with breakfast.   Yes [provider]  HYDROcodone-acetaminophen (NORCO/VICODIN) 5-325 MG tablet Take 1 tablet by mouth every 8 (eight) hours as needed for up to 5 days for moderate pain. 01/24/18 01/29/18 Yes Lassen, Arlo C, PA-C  ipratropium-albuterol (DUONEB) 0.5-2.5 (3) MG/3ML SOLN Take 3 mLs by nebulization 4 (four) times daily. 03/20/17  Yes Magdalen Spatz, NP  levofloxacin (LEVAQUIN) 500 MG tablet Take 500 mg by mouth once.   Yes [provider]  levothyroxine (SYNTHROID, LEVOTHROID) 125 MCG tablet Take 1 tablet (125 mcg total) by mouth daily. 08/16/17  Yes Nafziger, Tommi Rumps, NP  lidocaine (LIDODERM) 5 % Place 1 patch onto the skin daily. Remove & Discard patch within 12 hours or as directed by MD 01/24/18  Yes Lavina Hamman, MD  methylPREDNISolone (MEDROL DOSEPAK) 4 MG TBPK tablet Day 1: 8 mg PO before  breakfast, 4 mg after lunch and after dinner, and 8 mg at bedtime Day 2: 4 mg PO before breakfast, after lunch, and after dinner and 8 mg at bedtime Day 3: 4 mg PO before breakfast, after lunch, after dinner, and at bedtime Day 4: 4 mg PO before breakfast, after lunch, and at bedtime Day 5: 4 mg PO before breakfast and at bedtime Day 6: 4 mg PO before breakfast Patient taking differently: Take 4 mg by mouth See admin instructions. Day 1: 8 mg PO before breakfast, 4 mg after lunch and after dinner, and 8 mg at bedtime Day 2: 4 mg PO before breakfast, after lunch, and after dinner and 8 mg at bedtime Day 3: 4 mg PO before breakfast, after lunch, after dinner, and at bedtime Day 4: 4 mg PO before breakfast, after lunch, and at bedtime Day 5: 4 mg PO before breakfast and at bedtime Day 6: 4  mg PO before breakfast 01/24/18  Yes Lavina Hamman, MD  nicotine (NICODERM CQ - DOSED IN MG/24 HOURS) 14 mg/24hr patch Place 1 patch (14 mg total) onto the skin daily. 01/25/18  Yes Lavina Hamman, MD  Omega-3 Fatty Acids (FISH OIL) 600 MG CAPS Take 1 capsule by mouth 3 (three) times daily.   Yes [provider]  OXYGEN Inhale 3 L into the lungs continuous.    Yes [provider]  polyethylene glycol (MIRALAX / GLYCOLAX) packet Take 17 g by mouth daily. 01/25/18  Yes Lavina Hamman, MD  Respiratory Therapy Supplies (FLUTTER) DEVI Use as directed 05/14/15  Yes Javier Glazier, MD  senna-docusate (SENOKOT-S) 8.6-50 MG tablet Take 2 tablets by mouth 2 (two) times daily. 01/24/18  Yes Lavina Hamman, MD  ticagrelor (BRILINTA) 60 MG TABS tablet TAKE 1 TABLET(60 MG) BY MOUTH TWICE DAILY Patient taking differently: Take 60 mg by mouth 2 (two) times daily.  10/05/17  Yes Herminio Commons, MD  traZODone (DESYREL) 50 MG tablet Take 50 mg by mouth at bedtime as needed for sleep.   Yes [provider]  warfarin (COUMADIN) 1 MG tablet Take 0.5 mg by mouth See admin instructions. Give 0.5 mg by  mouth along with 5 mg to = 5.5 mg once a day    Yes [provider]  warfarin (COUMADIN) 5 MG tablet Take 5 mg by mouth See admin instructions. Give 5 mg by mouth along with 0.5 mg to = 5.5 mg once a day    Yes [provider]  nitroGLYCERIN (NITROSTAT) 0.4 MG SL tablet Place 1 tablet (0.4 mg total) under the tongue every 5 (five) minutes as needed for chest pain. 10/28/14   Herminio Commons, MD    Family History Family History  Problem Relation Age of Onset  . Emphysema Mother   . Alzheimer's disease Mother   . Emphysema Sister   . Heart disease Father   . Factor V Leiden deficiency Father   . Factor V Leiden deficiency Sister   . Factor V Leiden deficiency Daughter   . Kidney cancer Paternal Grandmother     Social History Social History   Tobacco Use  . Smoking status: Current Some Day Smoker    Packs/day: 0.50    Years: 50.00    Pack years: 25.00    Types: Cigarettes    Start date: 05/10/1968  . Smokeless tobacco: Never Used  Substance Use Topics  . Alcohol use: No    Alcohol/week: 0.0 standard drinks  . Drug use: No  on home oxygen   Allergies   Dilaudid [hydromorphone hcl]; Minocycline hcl; Prednisone; Varenicline tartrate; and Zocor [simvastatin - high dose]   Review of Systems Review of Systems  Unable to perform ROS: Other     Physical Exam Updated Vital Signs BP (!) 152/72 (BP Location: Left Arm)   Pulse (!) 117   Temp 98.2 F (36.8 C) (Oral)   Resp 17   Ht 5\' 4"  (1.626 m)   Wt 74.5 kg   SpO2 95%   BMI 28.19 kg/m   Vital signs normal except for tachycardia   Physical Exam Vitals signs and nursing note reviewed.  Constitutional:      General: She is not in acute distress.    Appearance: Normal appearance. She is well-developed. She is not ill-appearing or toxic-appearing.  HENT:     Head: Normocephalic and atraumatic.     Right Ear: External ear normal.  Left Ear: External ear normal.     Nose: Nose normal. No  mucosal edema or rhinorrhea.     Mouth/Throat:     Dentition: No dental abscesses.     Pharynx: No uvula swelling.  Eyes:     Conjunctiva/sclera: Conjunctivae normal.     Pupils: Pupils are equal, round, and reactive to light.  Neck:     Musculoskeletal: Full passive range of motion without pain, normal range of motion and neck supple.  Cardiovascular:     Rate and Rhythm: Regular rhythm.     Heart sounds: Normal heart sounds. No murmur. No friction rub. No gallop.   Pulmonary:     Effort: Tachypnea, accessory muscle usage, prolonged expiration and respiratory distress present.     Breath sounds: Decreased air movement present. Wheezing and rhonchi present. No rales.  Chest:     Chest wall: No tenderness or crepitus.  Abdominal:     General: Bowel sounds are normal. There is no distension.     Palpations: Abdomen is soft.     Tenderness: There is no abdominal tenderness. There is no guarding or rebound.  Musculoskeletal: Normal range of motion.        General: No swelling or tenderness.     Comments: Moves all extremities well.   Skin:    General: Skin is warm and dry.     Coloration: Skin is not pale.     Findings: No erythema or rash.  Neurological:     Mental Status: She is alert.     Cranial Nerves: No cranial nerve deficit.     Comments: Seems confused, according to family member she is confusing things that happened at home and in the nursing facility.  Psychiatric:        Speech: Speech normal.        Behavior: Behavior is cooperative.      ED Treatments / Results  Labs (all labs ordered are listed, but only abnormal results are displayed) Results for orders placed or performed during the hospital encounter of 01/28/18  CBC with Differential/Platelet  Result Value Ref Range   WBC 26.5 (H) 4.0 - 10.5 K/uL   RBC 3.44 (L) 3.87 - 5.11 MIL/uL   Hemoglobin 9.9 (L) 12.0 - 15.0 g/dL   HCT 33.9 (L) 36.0 - 46.0 %   MCV 98.5 80.0 - 100.0 fL   MCH 28.8 26.0 - 34.0 pg    MCHC 29.2 (L) 30.0 - 36.0 g/dL   RDW 16.5 (H) 11.5 - 15.5 %   Platelets 313 150 - 400 K/uL   nRBC 0.0 0.0 - 0.2 %   Neutrophils Relative % 79 %   Neutro Abs 20.9 (H) 1.7 - 7.7 K/uL   Lymphocytes Relative 4 %   Lymphs Abs 1.2 0.7 - 4.0 K/uL   Monocytes Relative 14 %   Monocytes Absolute 3.6 (H) 0.1 - 1.0 K/uL   Eosinophils Relative 0 %   Eosinophils Absolute 0.0 0.0 - 0.5 K/uL   Basophils Relative 0 %   Basophils Absolute 0.1 0.0 - 0.1 K/uL   WBC Morphology TOXIC GRANULATION    Immature Granulocytes 3 %   Abs Immature Granulocytes 0.68 (H) 0.00 - 0.07 K/uL   Reactive, Benign Lymphocytes PRESENT   Comprehensive metabolic panel  Result Value Ref Range   Sodium 138 135 - 145 mmol/L   Potassium 4.0 3.5 - 5.1 mmol/L   Chloride 93 (L) 98 - 111 mmol/L   CO2 38 (H) 22 - 32  mmol/L   Glucose, Bld 141 (H) 70 - 99 mg/dL   BUN 21 8 - 23 mg/dL   Creatinine, Ser 0.94 0.44 - 1.00 mg/dL   Calcium 8.4 (L) 8.9 - 10.3 mg/dL   Total Protein 6.8 6.5 - 8.1 g/dL   Albumin 2.8 (L) 3.5 - 5.0 g/dL   AST 31 15 - 41 U/L   ALT 37 0 - 44 U/L   Alkaline Phosphatase 94 38 - 126 U/L   Total Bilirubin 0.3 0.3 - 1.2 mg/dL   GFR calc non Af Amer >60 >60 mL/min   GFR calc Af Amer >60 >60 mL/min   Anion gap 7 5 - 15  Troponin I - ONCE - STAT  Result Value Ref Range   Troponin I 0.09 (HH) <0.03 ng/mL  Brain natriuretic peptide  Result Value Ref Range   B Natriuretic Peptide 161.0 (H) 0.0 - 100.0 pg/mL  Protime-INR  Result Value Ref Range   Prothrombin Time 19.2 (H) 11.4 - 15.2 seconds   INR 1.64   Blood gas, venous  Result Value Ref Range   O2 Content 4.0 L/min   pH, Ven 7.254 7.250 - 7.430   pCO2, Ven 88.9 (HH) 44.0 - 60.0 mmHg   pO2, Ven 51.6 (H) 32.0 - 45.0 mmHg   Bicarbonate 32.0 (H) 20.0 - 28.0 mmol/L   Acid-Base Excess 10.8 (H) 0.0 - 2.0 mmol/L   O2 Saturation 82.7 %   Patient temperature 37.0    Drawn by VENIPUNCTURE    Sample type VENOUS   Blood gas, venous  Result Value Ref Range   FIO2  100.00    Delivery systems BILEVEL POSITIVE AIRWAY PRESSURE    pH, Ven 7.220 (L) 7.250 - 7.430   pCO2, Ven 96.6 (HH) 44.0 - 60.0 mmHg   pO2, Ven 41.1 32.0 - 45.0 mmHg   Bicarbonate 31.3 (H) 20.0 - 28.0 mmol/L   Acid-Base Excess 10.4 (H) 0.0 - 2.0 mmol/L   O2 Saturation 69.3 %   Patient temperature 36.8   Blood gas, venous  Result Value Ref Range   FIO2 40.00    pH, Ven 7.403 7.250 - 7.430   pCO2, Ven 55.0 44.0 - 60.0 mmHg   pO2, Ven 183.0 (H) 32.0 - 45.0 mmHg   Bicarbonate 31.9 (H) 20.0 - 28.0 mmol/L   Acid-Base Excess 8.7 (H) 0.0 - 2.0 mmol/L   O2 Saturation 97.9 %   Patient temperature 37.0    Collection site VEIN     Laboratory interpretation all normal except VBG showing respiratory acidosis, positive troponin, compensated metabolic alkalosis, stable anemia, persistent leukocytosis on steroids with toxic granulation      EKG EKG Interpretation  Date/Time:  Monday January 28 2018 22:31:10 EST Ventricular Rate:  116 PR Interval:    QRS Duration: 115 QT Interval:  316 QTC Calculation: 439 R Axis:   91 Text Interpretation:  Sinus tachycardia Right atrial enlargement Nonspecific intraventricular conduction delay Nonspecific T abnormalities, inferior leads ST elevation, consider anterior injury Since last tracing rate faster 19 Jan 2018 Confirmed by Rolland Porter [phone]) on 01/28/2018 11:21:47 PM   Radiology Dg Chest 2 View  Result Date: 01/28/2018 CLINICAL DATA:  Shortness of breath EXAM: CHEST - 2 VIEW COMPARISON:  01/20/2017, 09/19/2017, CT chest 11/14/2017 FINDINGS: CP angle blunting/scarring is unchanged. Posterior lower lung spiculation, corresponding to CT demonstrated scarring. Emphysematous disease. Diffuse increased interstitial opacity right greater than left consistent with interstitial inflammatory process or edema. Stable cardiomediastinal silhouette. No pneumothorax. IMPRESSION: 1. Stable  CP angle scarring and left lower lobe posterior pleural and parenchymal  scarring 2. Emphysematous disease 3. Interval diffuse interstitial opacity right greater than left which may reflect interstitial inflammatory process or pulmonary edema Electronically Signed   By: Donavan Foil M.D.   On: 01/28/2018 23:04     Dg Lumbar Spine Complete  Result Date: 01/19/2018 CLINICAL DATA:  [place] tonight with pop in the low back. Low back pain. t. IMPRESSION: Mild degenerative changes in the lumbar spine. Normal alignment. No acute displaced fractures identified. Electronically Signed   By: Lucienne Capers M.D.   On: 01/19/2018 22:26      Mr Lumbar Spine Wo Contrast  Result Date: 01/20/2018 CLINICAL DATA:  Fall yesterday.  Pain.[name] IMPRESSION: 1. Acute/subacute superior endplate fracture of L3 on the right with approximately 30% loss of height. No retropulsed bone or associated stenosis. 2. Remote superior endplate fractures at L1 and L5. 3. Broad-based disc protrusion at L4-5 with mild subarticular and foraminal stenosis bilaterally. Electronically Signed   By: San Morelle M.D.   On: 01/20/2018 06:51      Procedures .Critical Care Performed by: Rolland Porter, MD Authorized by: Rolland Porter, MD   Critical care provider statement:    Critical care time (minutes):  45   Critical care was necessary to treat or prevent imminent or life-threatening deterioration of the following conditions:  Respiratory failure   Critical care was time spent personally by me on the following activities:  Discussions with consultants, examination of patient, obtaining history from patient or surrogate, ordering and review of laboratory studies, ordering and review of radiographic studies, pulse oximetry, re-evaluation of patient's condition and review of old charts   (including critical care time)  Medications Ordered in ED Medications  albuterol (PROVENTIL) (2.5 MG/3ML) 0.083% nebulizer solution 5 mg (5 mg Nebulization Given 01/28/18 2257)  magnesium sulfate IVPB 2 g 50 mL (0 g  Intravenous Stopped 01/29/18 0039)  ipratropium-albuterol (DUONEB) 0.5-2.5 (3) MG/3ML nebulizer solution 3 mL (3 mLs Nebulization Given 01/28/18 2257)  albuterol (PROVENTIL,VENTOLIN) solution continuous neb (10 mg/hr Nebulization Given 01/28/18 2330)  ipratropium (ATROVENT) nebulizer solution 0.5 mg (0.5 mg Nebulization Given 01/28/18 2330)  dexamethasone (DECADRON) injection 10 mg (10 mg Intravenous Given 01/28/18 2337)  ondansetron (ZOFRAN) injection 4 mg (4 mg Intravenous Given 01/28/18 2343)  ceFEPIme (MAXIPIME) 1 g in sodium chloride 0.9 % 100 mL IVPB (0 g Intravenous Stopped 01/29/18 0144)  vancomycin (VANCOCIN) 1,500 mg in sodium chloride 0.9 % 500 mL IVPB (0 mg Intravenous Stopped 01/29/18 0345)     Initial Impression / Assessment and Plan / ED Course  I have reviewed the triage vital signs and the nursing notes.  Pertinent labs & imaging results that were available during my care of the patient were reviewed by me and considered in my medical decision making (see chart for details).     Patient was finishing a albuterol nebulizer when I entered the room.  After her exam she was given a continuous nebulizer with albuterol and Atrovent, she was given Decadron IV, there is a prednisone allergy noted.  Chest x-ray was reviewed and BNP was added to her blood work.  Recheck at 1240 patient has some improvement of her air movement but she states she still feels tight.  1:40 AM patient's VBG has come back and she has a respiratory acidosis, BiPAP was ordered.  Nurse reports at the time the VBG was drawn her pulse ox was 91% but she was off her oxygen.  Patient's BNP was normal, patient was started on antibiotics because of her abnormal chest x-ray and she has a persistent leukocytosis with toxic granulation which goes along with an infection, this BNP is not consistent with pulmonary edema or excess pulmonary vascular fluid.  2:40 AM patient is sleeping, she is on the BiPAP.  She is hard to awaken.   Second VBG was ordered  Respiratory therapist adjusted her settings, we discussed doing another VBG in about 30 or 40 minutes.  Recheck at 5:20 AM patient has been moved into room 2 in anticipation of intubation.  They are drawing her third VBG now.  Patient is awake and gave me thumbs up when I walked in the room.  I asked her how her breathing is doing and she states about the same.  She seems much more alert than she did earlier.  5:40 AM patient's last ABG is much improved, her pH is 7.4, her PCO2 is 55 so her respiratory acidosis has resolved.  She continues on BiPAP.  I am going to talk to the hospitalist about admission.  6:08 AM Dr. Lucy Antigua, hospitalist will admit  Final Clinical Impressions(s) / ED Diagnoses   Final diagnoses:  COPD exacerbation ([place])  Acute on chronic respiratory failure with hypercapnia ([place])  Healthcare-associated pneumonia    Plan admission  Rolland Porter, MD, Barbette Or, MD 01/29/18 [phone]

## 2018-01-29 NOTE — Progress Notes (Signed)
01/28/2018 10:28 PM  01/29/2018 2:52 PM  [name] was seen and examined.  The H&P by the admitting provider, orders, imaging was reviewed.  Please see new orders.  Will continue to follow.   Vitals:   01/29/18 1027 01/29/18 1450  BP: (!) 127/51 (!) 134/48  Pulse: 92 (!) 103  Resp: 18 18  Temp: 98.4 F (36.9 C) 98.1 F (36.7 C)  SpO2: 99% 96%   Results for orders placed or performed during the hospital encounter of 01/28/18  CBC with Differential/Platelet  Result Value Ref Range   WBC 26.5 (H) 4.0 - 10.5 K/uL   RBC 3.44 (L) 3.87 - 5.11 MIL/uL   Hemoglobin 9.9 (L) 12.0 - 15.0 g/dL   HCT 33.9 (L) 36.0 - 46.0 %   MCV 98.5 80.0 - 100.0 fL   MCH 28.8 26.0 - 34.0 pg   MCHC 29.2 (L) 30.0 - 36.0 g/dL   RDW 16.5 (H) 11.5 - 15.5 %   Platelets 313 150 - 400 K/uL   nRBC 0.0 0.0 - 0.2 %   Neutrophils Relative % 79 %   Neutro Abs 20.9 (H) 1.7 - 7.7 K/uL   Lymphocytes Relative 4 %   Lymphs Abs 1.2 0.7 - 4.0 K/uL   Monocytes Relative 14 %   Monocytes Absolute 3.6 (H) 0.1 - 1.0 K/uL   Eosinophils Relative 0 %   Eosinophils Absolute 0.0 0.0 - 0.5 K/uL   Basophils Relative 0 %   Basophils Absolute 0.1 0.0 - 0.1 K/uL   WBC Morphology TOXIC GRANULATION    Immature Granulocytes 3 %   Abs Immature Granulocytes 0.68 (H) 0.00 - 0.07 K/uL   Reactive, Benign Lymphocytes PRESENT   Comprehensive metabolic panel  Result Value Ref Range   Sodium 138 135 - 145 mmol/L   Potassium 4.0 3.5 - 5.1 mmol/L   Chloride 93 (L) 98 - 111 mmol/L   CO2 38 (H) 22 - 32 mmol/L   Glucose, Bld 141 (H) 70 - 99 mg/dL   BUN 21 8 - 23 mg/dL   Creatinine, Ser 0.94 0.44 - 1.00 mg/dL   Calcium 8.4 (L) 8.9 - 10.3 mg/dL   Total Protein 6.8 6.5 - 8.1 g/dL   Albumin 2.8 (L) 3.5 - 5.0 g/dL   AST 31 15 - 41 U/L   ALT 37 0 - 44 U/L   Alkaline Phosphatase 94 38 - 126 U/L   Total Bilirubin 0.3 0.3 - 1.2 mg/dL   GFR calc non Af Amer >60 >60 mL/min   GFR calc Af Amer >60 >60 mL/min   Anion gap 7 5 - 15  Troponin I -  ONCE - STAT  Result Value Ref Range   Troponin I 0.09 (HH) <0.03 ng/mL  Brain natriuretic peptide  Result Value Ref Range   B Natriuretic Peptide 161.0 (H) 0.0 - 100.0 pg/mL  Protime-INR  Result Value Ref Range   Prothrombin Time 19.2 (H) 11.4 - 15.2 seconds   INR 1.64   Blood gas, venous  Result Value Ref Range   O2 Content 4.0 L/min   pH, Ven 7.254 7.250 - 7.430   pCO2, Ven 88.9 (HH) 44.0 - 60.0 mmHg   pO2, Ven 51.6 (H) 32.0 - 45.0 mmHg   Bicarbonate 32.0 (H) 20.0 - 28.0 mmol/L   Acid-Base Excess 10.8 (H) 0.0 - 2.0 mmol/L   O2 Saturation 82.7 %   Patient temperature 37.0    Drawn by VENIPUNCTURE    Sample type VENOUS   Blood  gas, venous  Result Value Ref Range   FIO2 100.00    Delivery systems BILEVEL POSITIVE AIRWAY PRESSURE    pH, Ven 7.220 (L) 7.250 - 7.430   pCO2, Ven 96.6 (HH) 44.0 - 60.0 mmHg   pO2, Ven 41.1 32.0 - 45.0 mmHg   Bicarbonate 31.3 (H) 20.0 - 28.0 mmol/L   Acid-Base Excess 10.4 (H) 0.0 - 2.0 mmol/L   O2 Saturation 69.3 %   Patient temperature 36.8   Blood gas, venous  Result Value Ref Range   FIO2 40.00    pH, Ven 7.403 7.250 - 7.430   pCO2, Ven 55.0 44.0 - 60.0 mmHg   pO2, Ven 183.0 (H) 32.0 - 45.0 mmHg   Bicarbonate 31.9 (H) 20.0 - 28.0 mmol/L   Acid-Base Excess 8.7 (H) 0.0 - 2.0 mmol/L   O2 Saturation 97.9 %   Patient temperature 37.0    Collection site VEIN   Procalcitonin - Baseline  Result Value Ref Range   Procalcitonin 0.17 ng/mL  Troponin I - Once  Result Value Ref Range   Troponin I 0.05 (HH) <0.03 ng/mL  Troponin I - Now Then Q6H  Result Value Ref Range   Troponin I 0.05 (HH) <0.03 ng/mL  CBG monitoring, ED  Result Value Ref Range   Glucose-Capillary 121 (H) 70 - 99 mg/dL     Murvin Natal, MD [facility]

## 2018-01-29 NOTE — ED Notes (Signed)
Patient states she can not breath with Bi-pap on. Patient taken off Bi-pap for a few minutes to have some ice chips.

## 2018-01-29 NOTE — ED Notes (Signed)
PT in to ambulate pt.  Pt currently has back brace in place and ambulating with O2 and 2 PT Techs.

## 2018-01-29 NOTE — Progress Notes (Signed)
ANTIBIOTIC CONSULT NOTE-Preliminary  Pharmacy Consult for Vancomycin Indication: Pneumonia  Allergies  Allergen Reactions  . Dilaudid [Hydromorphone Hcl] Hives and Nausea Only  . Minocycline Hcl     REACTION: Dizzy  . Prednisone     REACTION: feels like throat swelling, hallucinations  . Varenicline Tartrate     REACTION: Dizzy(chantix)   . Zocor [Simvastatin - High Dose] Other (See Comments)    myalgia    Patient Measurements: Height: 5\' 4"  (162.6 cm) Weight: 164 lb 3.9 oz (74.5 kg) IBW/kg (Calculated) : 54.7  Vital Signs: Temp: 98.3 F (36.8 C) (01/21 0040) Temp Source: Oral (01/21 0040) BP: 118/50 (01/21 0130) Pulse Rate: 121 (01/21 0130)  Labs: Recent Labs    01/28/18 0644 01/28/18 2330  WBC 24.7* 26.5*  HGB 10.1* 9.9*  PLT 285 313  CREATININE 0.81 0.94    Estimated Creatinine Clearance: 57.4 mL/min (by C-G formula based on SCr of 0.94 mg/dL).  No results for input(s): VANCOTROUGH, VANCOPEAK, VANCORANDOM, GENTTROUGH, GENTPEAK, GENTRANDOM, TOBRATROUGH, TOBRAPEAK, TOBRARND, AMIKACINPEAK, AMIKACINTROU, AMIKACIN in the last 72 hours.   Microbiology: No results found for this or any previous visit (from the past 720 hour(s)).  Medical History: Past Medical History:  Diagnosis Date  . Acute MI ([place])    x4, code blue x3  . Arteriosclerotic cardiovascular disease (ASCVD) 2002   Inf STEMI-2002. 2003-cutting balloon + brachytherapy for restenosis; subsequent acute stent thrombosis 06/2010 requiring 2 separate interventions (Zeta stent, then repeat cath with thrombectomy). focal basal inf AK, nl EF; 03/2011: Patent stents, minor nonobst  residual dz, nl EF; neg stress nuclear in 2008 and stress echo in 2009  . Chronic anticoagulation    Warfarin plus ticagrelor  . Chronic respiratory failure ([place]) 08/27/2013   On 2L 02  . COPD (chronic obstructive pulmonary disease) ([place])    02 dependent  . COPD (chronic obstructive pulmonary disease) ([place])   . Factor 5 Leiden  mutation, heterozygous ([place])   . Factor V Leiden, prothrombin gene mutation ([place]) 2006  . Hyperlipidemia   . Hypothyroidism   . Noncompliance   . Pelvic fracture ([place]) 2009  . Pulmonary embolism ([place]) 2006   Associated with deep vein thrombosis-2006; + factor V Leiden  . Tobacco abuse    50 pack years    Medications:  {Cefepime 1 Gm IV ordered by the EDP  Assessment: 68 yo female brought by EMS from the [facility] for complaints of SOB with productive cough. Pharmacy has been consulted for Vancomycin dosing for pneumonia  Goal of Therapy:  Vancomycin troughs 15-20 mcg/ml  Plan:  Preliminary review of pertinent patient information completed.  Protocol will be initiated with a loading dose of Vancomycin 1500 mg IV.  Forestine Na clinical pharmacist will complete review during morning rounds to assess patient and finalize treatment regimen if needed.  Norberto Sorenson, [facility] 01/29/2018,2:19 AM

## 2018-01-29 NOTE — Progress Notes (Signed)
Called to bedside due to patient stating she cannot breathe. Upon walking in the room patient pressing buttons on the machine and machine was in standby with the mask still on her face and Spo2 68%. I told the patient she was not allowed to touch the machine and that is why she could not breathe. I turned the BIPAP back on and pt still continued to say she could not breathe so I placed pt on [place]. Pt is on 6L [place] and SPo2 100%.

## 2018-01-29 NOTE — Plan of Care (Signed)
  Problem: Acute Rehab PT Goals(only PT should resolve) Goal: Pt Will Go Sit To Supine/Side Outcome: Progressing Flowsheets (Taken 01/29/2018 0933) Pt will go Sit to Supine/Side: with supervision Note:  With log roll technique Goal: Patient Will Transfer Sit To/From Stand Outcome: Progressing Flowsheets (Taken 01/29/2018 0933) Patient will transfer sit to/from stand: with supervision Goal: Pt Will Transfer Bed To Chair/Chair To Bed Outcome: Progressing Flowsheets (Taken 01/29/2018 0933) Pt will Transfer Bed to Chair/Chair to Bed: with supervision Goal: Pt Will Ambulate Outcome: Progressing Flowsheets (Taken 01/29/2018 0933) Pt will Ambulate: 50 feet; with min guard assist; with rolling walker   9:34 AM, 01/29/18 Talbot Grumbling, PT, DPT Physical Therapist with [facility] [phone] mobile phone

## 2018-01-29 NOTE — Evaluation (Signed)
Physical Therapy Evaluation Patient Details Name: [name] MRN: [id number] DOB: 06-13-1950 Today's Date: 01/29/2018   History of Present Illness  68 y.o. female with medical history significant of COPD, chronic hypoxia on 2 to 3 L of oxygen, ongoing smoker, coronary artery disease, factor V [place] and history of pulmonary embolism on Coumadin.  She presented to the emergency room after a fall at home.  MRI revealed L3 compression fx.     Clinical Impression  Patient below baseline for functional mobility and gait, slightly limited secondary to generalized weakness and fatigue during activity.  Patient presents supine in bed in ED, initially lethargic, but arouses with sternal rub and loud voices. Patient requires min assist with bed mobility, requiring increased time, use of bed rail, and with facial grimacing and moaning with mobility. Patient completes sit to/from stand transfers with RW and min guard with verbal cues for hand placement. Patient ambulates with RW and min assist for 20 feet demonstrating ataxic-like gait pattern, stiff bil knees throughout gait cycle, unsteadiness with turning requiring min assist for RW management, and increased time. Patient on 4 LPM during gait training with O2 saturation dropping to 84%, improving to 93% with seated rest break and pursed lip breathing exercises and required WC transport back to room. Patient left in bed with call button in lap and nursing in room. Patient will benefit from continued physical therapy in hospital and recommended venue below to increase strength, balance, endurance for safe ADLs and gait.     Follow Up Recommendations SNF    Equipment Recommendations  None recommended by PT    Recommendations for Other Services       Precautions / Restrictions Precautions Precautions: Fall Required Braces or Orthoses: Spinal Brace Spinal Brace: Thoracolumbosacral orthotic;Applied in sitting position Restrictions Weight Bearing  Restrictions: No      Mobility  Bed Mobility Overal bed mobility: Needs Assistance Bed Mobility: Rolling;Supine to Sit;Sit to Supine Rolling: Min assist Sidelying to sit: Min assist Supine to sit: Min assist Sit to supine: Min assist   General bed mobility comments: increased time, educated patient on log roll technique, but patient unable to maintain precautions and instead uses bed rail to upright trunk  Transfers Overall transfer level: Needs assistance Equipment used: Rolling walker (2 wheeled) Transfers: Sit to/from Stand Sit to Stand: Min guard         General transfer comment: increased time, pain with movement  Ambulation/Gait Ambulation/Gait assistance: Min assist Gait Distance (Feet): 20 Feet Assistive device: Rolling walker (2 wheeled) Gait Pattern/deviations: Decreased step length - right;Decreased step length - left;Decreased stride length Gait velocity: decreased   General Gait Details: ataxic-like gait pattern, stiff bil knee throughout gait cycle, unsteadiness requiring min assist with RW management  Stairs            Wheelchair Mobility    Modified Rankin (Stroke Patients Only)       Balance Overall balance assessment: Needs assistance Sitting-balance support: Feet supported;Bilateral upper extremity supported Sitting balance-Leahy Scale: Fair     Standing balance support: During functional activity;Bilateral upper extremity supported Standing balance-Leahy Scale: Poor Standing balance comment: with RW, spasm-like motions throughout standing activities increasing unsteadiness                             Pertinent Vitals/Pain Pain Assessment: Faces Pain Location: back with mobility Pain Descriptors / Indicators: Grimacing;Moaning;Guarding Pain Intervention(s): Limited activity within patient's tolerance;Monitored during session  Home Living Family/patient expects to be discharged to:: Private residence Living  Arrangements: Alone Available Help at Discharge: Family;Available PRN/intermittently Type of Home: House Home Access: Stairs to enter Entrance Stairs-Rails: Right;Left;Can reach both Entrance Stairs-Number of Steps: 4 Home Layout: One level Home Equipment: Walker - 2 wheels;Cane - single point;Shower seat;Bedside commode Additional Comments: information obtained from previous admission due to patient's confusion at eval    Prior Function Level of Independence: Independent with assistive device(s)         Comments: household ambulation without assistive device, uses [facility] for community: still drives; reports she has most recently been sponge bathing due to safety concerns with getting into/out of tub     Hand Dominance        Extremity/Trunk Assessment   Upper Extremity Assessment Upper Extremity Assessment: Defer to OT evaluation    Lower Extremity Assessment Lower Extremity Assessment: Generalized weakness    Cervical / Trunk Assessment Cervical / Trunk Assessment: Normal  Communication   Communication: No difficulties(slightly confused)  Cognition Arousal/Alertness: Awake/alert Behavior During Therapy: Agitated Overall Cognitive Status: Within Functional Limits for tasks assessed                                 General Comments: Patient confused, required redirection      General Comments General comments (skin integrity, edema, etc.): 4 LPM with transfer O2 saturation 96%, with ambulation 93-84%    Exercises     Assessment/Plan    PT Assessment Patient needs continued PT services  PT Problem List Decreased strength;Decreased activity tolerance;Decreased balance;Decreased mobility       PT Treatment Interventions Gait training;Functional mobility training;Therapeutic activities;Therapeutic exercise;Patient/family education    PT Goals (Current goals can be found in the Care Plan section)  Acute Rehab PT Goals Patient Stated Goal: home PT  Goal Formulation: With patient Time For Goal Achievement: 02/12/18 Potential to Achieve Goals: Good    Frequency Min 2X/week   Barriers to discharge        Co-evaluation               AM-PAC PT "6 Clicks" Mobility  Outcome Measure Help needed turning from your back to your side while in a flat bed without using bedrails?: A Lot Help needed moving from lying on your back to sitting on the side of a flat bed without using bedrails?: A Lot Help needed moving to and from a bed to a chair (including a wheelchair)?: A Little Help needed standing up from a chair using your arms (e.g., wheelchair or bedside chair)?: A Little Help needed to walk in hospital room?: A Little Help needed climbing 3-5 steps with a railing? : A Lot 6 Click Score: 15    End of Session Equipment Utilized During Treatment: Gait belt;Oxygen Activity Tolerance: Patient tolerated treatment well;Patient limited by fatigue;Patient limited by pain Patient left: in bed;with call bell/phone within reach;with nursing/sitter in room Nurse Communication: Mobility status PT Visit Diagnosis: Unsteadiness on feet (R26.81);Other abnormalities of gait and mobility (R26.89);Muscle weakness (generalized) (M62.81)    Time: 6659-9357 PT Time Calculation (min) (ACUTE ONLY): 29 min   Charges:   PT Evaluation $PT Eval Moderate Complexity: 1 Mod PT Treatments $Gait Training: 8-22 mins        9:32 AM, 01/29/18 Talbot Grumbling, PT, DPT Physical Therapist with [facility] [phone] mobile phone

## 2018-01-29 NOTE — ED Notes (Signed)
Pt is having full body jerks and hearing voices, edp notified.

## 2018-01-29 NOTE — ED Notes (Signed)
Patient will not keep Bi-pap on at this time. Respiratory contacted.

## 2018-01-29 NOTE — ED Notes (Signed)
Date and time results receive: 01/29/18 00:31  Test: troponin Critical Value: 0.09  Name of Provider Notified: Tomi Bamberger  Orders Received? Or Actions Taken?: Physician notified

## 2018-01-30 LAB — CBC
HCT: 29.8 % — ABNORMAL LOW (ref 36.0–46.0)
HEMOGLOBIN: 8.5 g/dL — AB (ref 12.0–15.0)
MCH: 29 pg (ref 26.0–34.0)
MCHC: 28.5 g/dL — ABNORMAL LOW (ref 30.0–36.0)
MCV: 101.7 fL — ABNORMAL HIGH (ref 80.0–100.0)
Platelets: 317 10*3/uL (ref 150–400)
RBC: 2.93 MIL/uL — ABNORMAL LOW (ref 3.87–5.11)
RDW: 16.3 % — ABNORMAL HIGH (ref 11.5–15.5)
WBC: 16.2 10*3/uL — AB (ref 4.0–10.5)
nRBC: 0 % (ref 0.0–0.2)

## 2018-01-30 LAB — PROTIME-INR
INR: 2.08
Prothrombin Time: 23.1 seconds — ABNORMAL HIGH (ref 11.4–15.2)

## 2018-01-30 MED ORDER — WARFARIN SODIUM 2.5 MG PO TABS
5.5000 mg | ORAL_TABLET | Freq: Once | ORAL | Status: AC
  Administered 2018-01-30: 5.5 mg via ORAL
  Filled 2018-01-30: qty 1

## 2018-01-30 MED ORDER — ARFORMOTEROL TARTRATE 15 MCG/2ML IN NEBU
15.0000 ug | INHALATION_SOLUTION | Freq: Two times a day (BID) | RESPIRATORY_TRACT | Status: DC
  Administered 2018-01-30 – 2018-02-01 (×4): 15 ug via RESPIRATORY_TRACT
  Filled 2018-01-30 (×4): qty 2

## 2018-01-30 MED ORDER — BUDESONIDE 0.5 MG/2ML IN SUSP
0.5000 mg | Freq: Two times a day (BID) | RESPIRATORY_TRACT | Status: DC
  Administered 2018-01-30 – 2018-02-01 (×4): 0.5 mg via RESPIRATORY_TRACT
  Filled 2018-01-30 (×4): qty 2

## 2018-01-30 NOTE — Progress Notes (Addendum)
PROGRESS NOTE  Krysia Zahradnik  KDT:[id number]  DOB: [birth year]  DOA: 01/28/2018 PCP: [name], NP  Brief Admission Hx: [name] is a 68 y.o. female with medical history significant of chronic respiratory failure, COPD, hypercoagulable state presents with shortness of breath.    MDM/Assessment & Plan:   1. COPD Exacerbation - continue intensive hospital treatments with IV steroids, antibiotics, scheduled nebs, bipap if needed and supportive therapy.  2. Tobacco abuse - Pt strongly advised to please stop all tobacco use.  Nicotine patch ordered as needed for cravings.  3. History of VTE and Factor V Leiden - pt is chronically anticoagulated with warfarin.  Appreciate pharmacy for assisting with management.  4. CAD  - cycling troponins, no s/s of ACS.  Continue home medical management.  5. Leukocytosis - likely from steroids, following.  6. Traumatic and osteoporotic L3 fracture - Pt was recently seen by spine surgery at [facility] and surgery was not recommended.  Pt was placed on TLSO brace, pain management and PT recommended.  She was sent to SNF just prior to this admission.  Pt was supposed to have neurosurgery follow up with  Adele Dan NP in 1 week.  7. Pain management - Pt has duragesic patch.   8. Hypothyroidism - resumed home levothyroxine.   DVT prophylaxis: warfarin  Code Status: full  Family Communication: patient  Disposition Plan: SNF at discharge   Consultants:  Pharmacy   Procedures:    Antimicrobials:  Levofloxacin 1/21 >  Subjective: Pt reports that SOB is slightly improved only but not on bipap at this time.    Objective: Vitals:   01/30/18 1054 01/30/18 1301 01/30/18 1425 01/30/18 1650  BP: (!) 120/51  (!) 120/51   Pulse:   98   Resp:   17   Temp: (!) 97.5 F (36.4 C)  98.5 F (36.9 C)   TempSrc: Axillary  Oral   SpO2:  93% 94% 93%  Weight:      Height:        Intake/Output Summary (Last 24 hours) at 01/30/2018 1749 Last data  filed at 01/30/2018 1456 Gross per 24 hour  Intake 300 ml  Output 1500 ml  Net -1200 ml   Filed Weights   01/28/18 2231 01/29/18 1029 01/30/18 0522  Weight: 74.5 kg 76 kg 77.4 kg   REVIEW OF SYSTEMS  As per history otherwise all reviewed and reported negative  Exam:  General exam: awake, alert, NAD.  Respiratory system: diffuse wheezes and rales bilateral.  Moderate increased work of breathing. Cardiovascular system: S1 & S2 heard, RRR. No JVD, murmurs, gallops, clicks or pedal edema. Gastrointestinal system: Abdomen is nondistended, soft and nontender. Normal bowel sounds heard. Central nervous system: Alert and oriented. No focal neurological deficits. Extremities: no CCE.  Data Reviewed: Basic Metabolic Panel: Recent Labs  Lab 01/24/18 0233 01/25/18 0700 01/28/18 0644 01/28/18 2330  NA 138 137 139 138  K 3.7 4.2 4.5 4.0  CL 97* 95* 94* 93*  CO2 34* 33* 38* 38*  GLUCOSE 137* 89 85 141*  BUN 17 20 20 21   CREATININE 0.82 0.75 0.81 0.94  CALCIUM 8.0* 8.6* 8.7* 8.4*  MG 1.8  --   --   --    Liver Function Tests: Recent Labs  Lab 01/28/18 2330  AST 31  ALT 37  ALKPHOS 94  BILITOT 0.3  PROT 6.8  ALBUMIN 2.8*   No results for input(s): LIPASE, AMYLASE in the last 168 hours. No results for input(s):  AMMONIA in the last 168 hours. CBC: Recent Labs  Lab 01/24/18 0233 01/25/18 0700 01/28/18 0644 01/28/18 2330 01/30/18 0546  WBC 15.4* 16.7* 24.7* 26.5* 16.2*  NEUTROABS  --  13.1* 19.9* 20.9*  --   HGB 9.8* 11.8* 10.1* 9.9* 8.5*  HCT 31.0* 40.4 34.7* 33.9* 29.8*  MCV 94.5 99.5 100.0 98.5 101.7*  PLT 216 170 285 313 317   Cardiac Enzymes: Recent Labs  Lab 01/28/18 2330 01/29/18 0659 01/29/18 1306 01/29/18 1946  TROPONINI 0.09* 0.05* 0.05* 0.04*   CBG (last 3)  Recent Labs    01/29/18 0836  GLUCAP 121*   Recent Results (from the past 240 hour(s))  MRSA PCR Screening     Status: None   Collection Time: 01/29/18  7:40 PM  Result Value Ref Range  Status   MRSA by PCR NEGATIVE NEGATIVE Final    Comment:        The GeneXpert MRSA Assay (FDA approved for NASAL specimens only), is one component of a comprehensive MRSA colonization surveillance program. It is not intended to diagnose MRSA infection nor to guide or monitor treatment for MRSA infections. Performed at [facility], [address], [place], [place] [address]     Studies: Dg Chest 2 View  Result Date: 01/28/2018 CLINICAL DATA:  Shortness of breath EXAM: CHEST - 2 VIEW COMPARISON:  01/20/2017, 09/19/2017, CT chest 11/14/2017 FINDINGS: CP angle blunting/scarring is unchanged. Posterior lower lung spiculation, corresponding to CT demonstrated scarring. Emphysematous disease. Diffuse increased interstitial opacity right greater than left consistent with interstitial inflammatory process or edema. Stable cardiomediastinal silhouette. No pneumothorax. IMPRESSION: 1. Stable CP angle scarring and left lower lobe posterior pleural and parenchymal scarring 2. Emphysematous disease 3. Interval diffuse interstitial opacity right greater than left which may reflect interstitial inflammatory process or pulmonary edema Electronically Signed   By: Donavan Foil M.D.   On: 01/28/2018 23:04   Scheduled Meds: . arformoterol  15 mcg Nebulization BID  . baclofen  10 mg Oral TID  . budesonide  0.5 mg Nebulization BID  . fentaNYL  1 patch Transdermal Q72H  . ferrous sulfate  325 mg Oral Q breakfast  . ipratropium-albuterol  3 mL Nebulization QID  . levofloxacin  500 mg Oral Daily  . levothyroxine  125 mcg Oral Daily  . methylPREDNISolone (SOLU-MEDROL) injection  60 mg Intravenous Q6H  . nicotine  14 mg Transdermal Daily  . omega-3 acid ethyl esters  1 capsule Oral BID  . polyethylene glycol  17 g Oral Daily  . senna-docusate  2 tablet Oral BID  . ticagrelor  60 mg Oral BID  . Warfarin - Pharmacist Dosing Inpatient   Does not apply q1800   Continuous Infusions:  Active Problems:    Factor V Leiden, prothrombin gene mutation (HCC)   Chronic anticoagulation   COPD exacerbation (HCC)   Acute respiratory failure (HCC)   Vertebral fracture, osteoporotic (HCC)   Acute on chronic respiratory failure with hypoxia and hypercapnia (HCC)   Acute and chronic respiratory failure (acute-on-chronic) (HCC)   Pressure injury of skin  Time spent:   Irwin Brakeman, MD [facility]  01/30/2018, 5:49 PM    LOS: 1 day

## 2018-01-30 NOTE — Progress Notes (Signed)
OT Cancellation Note  Patient Details Name: Taraoluwa Thakur MRN: [id number] DOB: 03/26/1950   Cancelled Treatment:    Reason Eval/Treat Not Completed: Patient at procedure or test/ unavailable. OT attempted to see pt this am. On first attempt RT working with pt. On second attempt pt receiving breakfast and setting up to eat. Will attempt at a later time.   Guadelupe Sabin, OTR/L  [phone] 01/30/2018, 8:13 AM

## 2018-01-30 NOTE — NC FL2 (Signed)
[place] LEVEL OF CARE SCREENING TOOL     IDENTIFICATION  Patient Name: [name] Birthdate: 11-Nov-1950 Sex: female Admission Date (Current Location): 01/28/2018  [facility] and [place] Number:  [employer] and Address:  [place] [address], [place]      Provider Number: [id number]  Attending Physician Name and Address:  Murlean Iba, MD  Relative Name and Phone Number:       Current Level of Care: Hospital Recommended Level of Care: [place] Prior Approval Number:    Date Approved/Denied:   PASRR Number: [id number]  Discharge Plan: SNF    Current Diagnoses: Patient Active Problem List   Diagnosis Date Noted  . Acute on chronic respiratory failure with hypoxia and hypercapnia ([place]) 01/29/2018  . Acute and chronic respiratory failure (acute-on-chronic) ([place]) 01/29/2018  . Pressure injury of skin 01/29/2018  . Hyperglycemia 01/21/2018  . COPD with exacerbation ([place]) 01/21/2018  . Vertebral fracture, osteoporotic ([place]) 01/20/2018  . Rectal bleeding 09/05/2017  . Left lower quadrant pain 09/05/2017  . Oxygen dependent 09/05/2017  . Chronic back pain 05/16/2017  . COPD exacerbation ([place]) 02/19/2017  . Tachycardia 02/19/2017  . Influenza A 02/19/2017  . Acute respiratory failure ([place]) 02/19/2017  . Dyspnea 11/26/2014  . Loss of weight 11/26/2014  . Chronic respiratory failure ([place]) 08/27/2013  . Chest pain 08/24/2013  . Encounter for therapeutic drug monitoring 02/13/2013  . Abnormal weight loss 03/21/2012  . Cerebrovascular disease 11/15/2011  . CAD S/P percutaneous coronary angioplasty   . Factor V Leiden, prothrombin gene mutation ([place])   . Hyperlipidemia   . Chronic anticoagulation   . Hypothyroidism 11/23/2006  . TOBACCO ABUSE 11/23/2006  . History of pulmonary embolus (PE) 11/23/2006  . COPD (chronic obstructive pulmonary disease) with emphysema ([place]) 11/23/2006     Orientation RESPIRATION BLADDER Height & Weight     Self, Time, Situation, Place  Normal(Currently: HFNC 6L (at baseline patient is on 2-3L)) Continent Weight: 170 lb 10.2 oz (77.4 kg) Height:  5\' 5"  (165.1 cm)  BEHAVIORAL SYMPTOMS/MOOD NEUROLOGICAL BOWEL NUTRITION STATUS      Continent Diet(Heart Healthy)  AMBULATORY STATUS COMMUNICATION OF NEEDS Skin   Limited Assist Verbally PU Stage and Appropriate Care(stage 1: Buttocks)                       Personal Care Assistance Level of Assistance  Bathing, Feeding, Dressing Bathing Assistance: Limited assistance Feeding assistance: Independent Dressing Assistance: Limited assistance     Functional Limitations Info  Sight, Hearing, Speech Sight Info: Adequate Hearing Info: Adequate Speech Info: Adequate    SPECIAL CARE FACTORS FREQUENCY  PT (By licensed PT)     PT Frequency: 5x/week               Contractures Contractures Info: Not present    Additional Factors Info  Code Status, Allergies Code Status Info: Full Code Allergies Info: Dilaudid Hydromorhone Hcl, Minocycline Hcl, Prednisone, Varenicline Tartrate, Zocor Simvastin - High Dose           Current Medications (01/30/2018):  This is the current hospital active medication list Current Facility-Administered Medications  Medication Dose Route Frequency Provider Last Rate Last Dose  . acetaminophen (TYLENOL) tablet 650 mg  650 mg Oral Q6H PRN Johnson-Pitts, Endia, MD       Or  . acetaminophen (TYLENOL) suppository 650 mg  650 mg Rectal Q6H PRN Johnson-Pitts, Endia, MD      .  albuterol (PROVENTIL) (2.5 MG/3ML) 0.083% nebulizer solution 2.5 mg  2.5 mg Nebulization Q4H PRN Johnson-Pitts, Endia, MD      . arformoterol (BROVANA) nebulizer solution 15 mcg  15 mcg Nebulization BID Johnson-Pitts, Endia, MD   15 mcg at 01/29/18 1940  . baclofen (LIORESAL) tablet 10 mg  10 mg Oral TID Johnson-Pitts, Endia, MD   10 mg at 01/29/18 2139  . benzonatate (TESSALON)  capsule 100 mg  100 mg Oral BID PRN Johnson-Pitts, Endia, MD   100 mg at 01/29/18 1118  . budesonide (PULMICORT) nebulizer solution 0.5 mg  0.5 mg Nebulization BID Johnson-Pitts, Endia, MD   0.5 mg at 01/30/18 0803  . fentaNYL (DURAGESIC) 50 MCG/HR 1 patch  1 patch Transdermal Q72H Johnson-Pitts, Endia, MD   1 patch at 01/29/18 1132  . ferrous sulfate tablet 325 mg  325 mg Oral Q breakfast Johnson-Pitts, Endia, MD   325 mg at 01/30/18 0830  . HYDROcodone-acetaminophen (NORCO/VICODIN) 5-325 MG per tablet 1 tablet  1 tablet Oral Q8H PRN Johnson-Pitts, Endia, MD   1 tablet at 01/29/18 2139  . ipratropium-albuterol (DUONEB) 0.5-2.5 (3) MG/3ML nebulizer solution 3 mL  3 mL Nebulization QID Johnson-Pitts, Endia, MD   3 mL at 01/30/18 0759  . levofloxacin (LEVAQUIN) tablet 500 mg  500 mg Oral Daily Johnson-Pitts, Endia, MD   500 mg at 01/29/18 1118  . levothyroxine (SYNTHROID, LEVOTHROID) tablet 125 mcg  125 mcg Oral Daily Johnson-Pitts, Endia, MD   125 mcg at 01/30/18 0555  . magnesium hydroxide (MILK OF MAGNESIA) suspension 30 mL  30 mL Oral Daily PRN Johnson-Pitts, Endia, MD      . methylPREDNISolone sodium succinate (SOLU-MEDROL) 125 mg/2 mL injection 60 mg  60 mg Intravenous Q6H Johnson, Clanford L, MD   60 mg at 01/30/18 0832  . nicotine (NICODERM CQ - dosed in mg/24 hours) patch 14 mg  14 mg Transdermal Daily Johnson-Pitts, Endia, MD   14 mg at 01/29/18 1117  . nitroGLYCERIN (NITROSTAT) SL tablet 0.4 mg  0.4 mg Sublingual Q5 min PRN Johnson-Pitts, Endia, MD      . omega-3 acid ethyl esters (LOVAZA) capsule 1 g  1 capsule Oral BID Johnson-Pitts, Endia, MD   1 g at 01/29/18 2139  . ondansetron (ZOFRAN) tablet 4 mg  4 mg Oral Q6H PRN Johnson-Pitts, Endia, MD       Or  . ondansetron (ZOFRAN) injection 4 mg  4 mg Intravenous Q6H PRN Johnson-Pitts, Endia, MD      . polyethylene glycol (MIRALAX / GLYCOLAX) packet 17 g  17 g Oral Daily Johnson-Pitts, Endia, MD   17 g at 01/29/18 1116  . senna-docusate  (Senokot-S) tablet 2 tablet  2 tablet Oral BID Johnson-Pitts, Endia, MD   2 tablet at 01/29/18 2139  . ticagrelor (BRILINTA) tablet 60 mg  60 mg Oral BID Johnson-Pitts, Endia, MD   60 mg at 01/29/18 2139  . traZODone (DESYREL) tablet 50 mg  50 mg Oral QHS PRN Johnson-Pitts, Endia, MD      . warfarin (COUMADIN) tablet 5.5 mg  5.5 mg Oral Once Murlean Iba, MD      . Warfarin - Pharmacist Dosing Inpatient   Does not apply q1800 Murlean Iba, MD         Discharge Medications: Please see discharge summary for a list of discharge medications.  Relevant Imaging Results:  Relevant Lab Results:   Additional Information SSN: [id number]  Ihor Gully, LCSW

## 2018-01-30 NOTE — Progress Notes (Signed)
ANTICOAGULATION CONSULT NOTE -follow up [place] for warfarin Indication: VTE treatment  Allergies  Allergen Reactions  . Dilaudid [Hydromorphone Hcl] Hives and Nausea Only  . Minocycline Hcl     REACTION: Dizzy  . Prednisone     REACTION: feels like throat swelling, hallucinations  . Varenicline Tartrate     REACTION: Dizzy(chantix)   . Zocor [Simvastatin - High Dose] Other (See Comments)    myalgia    Patient Measurements: Height: 5\' 5"  (165.1 cm) Weight: 170 lb 10.2 oz (77.4 kg) IBW/kg (Calculated) : 57   Vital Signs: Temp: 98.3 F (36.8 C) (01/22 0522) Temp Source: Oral (01/22 0522) BP: 123/59 (01/22 0522) Pulse Rate: 89 (01/22 0522)  Labs: Recent Labs    01/28/18 0644  01/28/18 2330 01/29/18 0659 01/29/18 1306 01/29/18 1946 01/30/18 0546  HGB 10.1*  --  9.9*  --   --   --  8.5*  HCT 34.7*  --  33.9*  --   --   --  29.8*  PLT 285  --  313  --   --   --  317  LABPROT  --   --  19.2*  --   --   --  23.1*  INR  --   --  1.64  --   --   --  2.08  CREATININE 0.81  --  0.94  --   --   --   --   TROPONINI  --    < > 0.09* 0.05* 0.05* 0.04*  --    < > = values in this interval not displayed.    Estimated Creatinine Clearance: 59.8 mL/min (by C-G formula based on SCr of 0.94 mg/dL).   Medical History: Past Medical History:  Diagnosis Date  . Acute MI ([place])    x4, code blue x3  . Arteriosclerotic cardiovascular disease (ASCVD) 2002   Inf STEMI-2002. 2003-cutting balloon + brachytherapy for restenosis; subsequent acute stent thrombosis 06/2010 requiring 2 separate interventions (Zeta stent, then repeat cath with thrombectomy). focal basal inf AK, nl EF; 03/2011: Patent stents, minor nonobst  residual dz, nl EF; neg stress nuclear in 2008 and stress echo in 2009  . Chronic anticoagulation    Warfarin plus ticagrelor  . Chronic respiratory failure ([place]) 08/27/2013   On 2L 02  . COPD (chronic obstructive pulmonary disease) ([place])    02 dependent   . COPD (chronic obstructive pulmonary disease) ([place])   . Factor 5 Leiden mutation, heterozygous ([place])   . Factor V Leiden, prothrombin gene mutation ([place]) 2006  . Hyperlipidemia   . Hypothyroidism   . Noncompliance   . Pelvic fracture ([place]) 2009  . Pulmonary embolism ([place]) 2006   Associated with deep vein thrombosis-2006; + factor V Leiden  . Tobacco abuse    50 pack years    Medications:  Medications Prior to Admission  Medication Sig Dispense Refill Last Dose  . albuterol (PROVENTIL) (2.5 MG/3ML) 0.083% nebulizer solution USE 1 VIAL BY NEBULIZER EVERY 4 HOURS AS NEEDED FOR WHEEZING. DX: J44.9 (Patient taking differently: Take 2.5 mg by nebulization every 4 (four) hours as needed for wheezing or shortness of breath. ) 375 vial 0 unknown  . arformoterol (BROVANA) 15 MCG/2ML NEBU Take 2 mLs (15 mcg total) by nebulization 2 (two) times daily. 120 mL 6 01/28/2018 at Unknown time  . baclofen (LIORESAL) 10 MG tablet Take 1 tablet (10 mg total) by mouth 3 (three) times daily. 15 tablet 0 01/28/2018 at Unknown time  .  benzonatate (TESSALON) 100 MG capsule Take 1 capsule (100 mg total) by mouth 2 (two) times daily as needed for cough. 20 capsule 1 01/28/2018 at Unknown time  . budesonide (PULMICORT) 0.5 MG/2ML nebulizer solution Take 2 mLs (0.5 mg total) by nebulization 2 (two) times daily. Dx: J43.9 120 mL 3 01/28/2018 at Unknown time  . fentaNYL (DURAGESIC) 50 MCG/HR Place 1 patch onto the skin every 3 (three) days. 10 patch 0 01/27/2018 at Unknown time  . ferrous sulfate 325 (65 FE) MG tablet Take 325 mg by mouth daily with breakfast.   01/28/2018 at Unknown time  . [EXPIRED] HYDROcodone-acetaminophen (NORCO/VICODIN) 5-325 MG tablet Take 1 tablet by mouth every 8 (eight) hours as needed for up to 5 days for moderate pain. 15 tablet 0 01/28/2018 at 1621  . ipratropium-albuterol (DUONEB) 0.5-2.5 (3) MG/3ML SOLN Take 3 mLs by nebulization 4 (four) times daily. 360 mL 3 01/28/2018 at Unknown time  .  levofloxacin (LEVAQUIN) 500 MG tablet Take 500 mg by mouth once.   01/28/2018 at Unknown time  . levothyroxine (SYNTHROID, LEVOTHROID) 125 MCG tablet Take 1 tablet (125 mcg total) by mouth daily. 90 tablet 3 01/28/2018 at Unknown time  . lidocaine (LIDODERM) 5 % Place 1 patch onto the skin daily. Remove & Discard patch within 12 hours or as directed by MD 30 patch 0 01/28/2018 at Unknown time  . methylPREDNISolone (MEDROL DOSEPAK) 4 MG TBPK tablet Day 1: 8 mg PO before breakfast, 4 mg after lunch and after dinner, and 8 mg at bedtime Day 2: 4 mg PO before breakfast, after lunch, and after dinner and 8 mg at bedtime Day 3: 4 mg PO before breakfast, after lunch, after dinner, and at bedtime Day 4: 4 mg PO before breakfast, after lunch, and at bedtime Day 5: 4 mg PO before breakfast and at bedtime Day 6: 4 mg PO before breakfast (Patient taking differently: Take 4 mg by mouth See admin instructions. Day 1: 8 mg PO before breakfast, 4 mg after lunch and after dinner, and 8 mg at bedtime Day 2: 4 mg PO before breakfast, after lunch, and after dinner and 8 mg at bedtime Day 3: 4 mg PO before breakfast, after lunch, after dinner, and at bedtime Day 4: 4 mg PO before breakfast, after lunch, and at bedtime Day 5: 4 mg PO before breakfast and at bedtime Day 6: 4 mg PO before breakfast) 21 tablet 0 01/28/2018 at 1237p  . nicotine (NICODERM CQ - DOSED IN MG/24 HOURS) 14 mg/24hr patch Place 1 patch (14 mg total) onto the skin daily. 28 patch 0 unknown  . Omega-3 Fatty Acids (FISH OIL) 600 MG CAPS Take 1 capsule by mouth 3 (three) times daily.   01/28/2018 at Unknown time  . OXYGEN Inhale 3 L into the lungs continuous.    01/28/2018 at Unknown time  . polyethylene glycol (MIRALAX / GLYCOLAX) packet Take 17 g by mouth daily. 14 each 0 01/28/2018 at Unknown time  . Respiratory Therapy Supplies (FLUTTER) DEVI Use as directed 1 each 0 unknown  . senna-docusate (SENOKOT-S) 8.6-50 MG tablet Take 2 tablets by mouth 2 (two)  times daily. 30 tablet 0 01/28/2018 at Unknown time  . ticagrelor (BRILINTA) 60 MG TABS tablet TAKE 1 TABLET(60 MG) BY MOUTH TWICE DAILY (Patient taking differently: Take 60 mg by mouth 2 (two) times daily. ) 180 tablet 3 01/28/2018 at 830a  . traZODone (DESYREL) 50 MG tablet Take 50 mg by mouth at bedtime as needed for  sleep.   01/25/2018 at Unknown time  . warfarin (COUMADIN) 1 MG tablet Take 0.5 mg by mouth See admin instructions. Give 0.5 mg by mouth along with 5 mg to = 5.5 mg once a day    01/28/2018 at 1710  . warfarin (COUMADIN) 5 MG tablet Take 5 mg by mouth See admin instructions. Give 5 mg by mouth along with 0.5 mg to = 5.5 mg once a day    01/28/2018 at 1710  . nitroGLYCERIN (NITROSTAT) 0.4 MG SL tablet Place 1 tablet (0.4 mg total) under the tongue every 5 (five) minutes as needed for chest pain. 25 tablet 3 unknown    Assessment: Pharmacy consulted to dose warfarin in patient with history of pulmonary embolism/DVT.  INR is 2.08, therapeutic this AM Home dose of warfarin listed as 5.5 mg daily.  Goal of Therapy:  INR 2-3 Monitor platelets by anticoagulation protocol: Yes   Plan:  Warfarin 5.5 mg x 1 dose today PT-INR daily Monitor s/s of bleeding  Isac Sarna, BS Vena [place], BCPS Clinical Pharmacist Pager [phone] 01/30/2018,8:38 AM

## 2018-01-30 NOTE — Clinical Social Work Note (Signed)
Clinical Social Work Assessment  Patient Details  Name: [name] MRN: [id number] Date of Birth: 1950-11-10  Date of referral:  01/30/18               Reason for consult:  Facility Placement                Permission sought to share information with:    Permission granted to share information::     Name::        Agency::  [facility]   Relationship::     Contact Information:     Housing/Transportation Living arrangements for the past 2 months:  [place] of Information:  Patient, Facility Patient Interpreter Needed:  None Criminal Activity/Legal Involvement Pertinent to Current Situation/Hospitalization:  No - Comment as needed Significant Relationships:  Adult Children Lives with:  Self Do you feel safe going back to the place where you live?  Yes Need for family participation in patient care:  Yes (Comment)  Care giving concerns:  None identified at baseline.    Social Worker assessment / plan:  Patient had been a resident at [facility] for a few days. Per Marianna Fuss patient has not been pleased with the care/rehab she is receiving and will need to go to a different facility at discharge.  Patient admitted to some level of disagreement. She is agreeable to go to another facility in the county at discharge.   Employment status:  Retired Forensic scientist:  Medicare PT Recommendations:  Not assessed at this time [place] / Referral to community resources:  [place]  Patient/Family's Response to care:  Patient is agreeable to go to rehab at a different facility.   Patient/Family's Understanding of and Emotional Response to Diagnosis, Current Treatment, and Prognosis:  Patient understands her diagnosis, treatment and prognosis.   Emotional Assessment Appearance:  Appears stated age Attitude/Demeanor/Rapport:    Affect (typically observed):  Accepting, Calm Orientation:  Oriented to Self, Oriented to Place, Oriented  to  Time, Oriented to Situation Alcohol / Substance use:  Not Applicable Psych involvement (Current and /or in the community):  No (Comment)  Discharge Needs  Concerns to be addressed:  Discharge Planning Concerns Readmission within the last 30 days:  No Current discharge risk:  Chronically ill Barriers to Discharge:  No Barriers Identified   Ihor Gully, LCSW 01/30/2018, 4:26 PM

## 2018-01-31 ENCOUNTER — Encounter (HOSPITAL_COMMUNITY)
Admission: RE | Admit: 2018-01-31 | Discharge: 2018-01-31 | Disposition: A | Discharge status: Home / Self Care | Payer: Medicare Other | Source: Skilled Nursing Facility | Attending: Internal Medicine | Admitting: Internal Medicine

## 2018-01-31 DIAGNOSIS — Z720 Tobacco use: Secondary | ICD-10-CM | POA: Insufficient documentation

## 2018-01-31 DIAGNOSIS — Z9981 Dependence on supplemental oxygen: Secondary | ICD-10-CM | POA: Insufficient documentation

## 2018-01-31 DIAGNOSIS — J441 Chronic obstructive pulmonary disease with (acute) exacerbation: Secondary | ICD-10-CM | POA: Insufficient documentation

## 2018-01-31 DIAGNOSIS — S32039D Unspecified fracture of third lumbar vertebra, subsequent encounter for fracture with routine healing: Secondary | ICD-10-CM | POA: Insufficient documentation

## 2018-01-31 DIAGNOSIS — Z87891 Personal history of nicotine dependence: Secondary | ICD-10-CM | POA: Insufficient documentation

## 2018-01-31 DIAGNOSIS — Z9181 History of falling: Secondary | ICD-10-CM | POA: Insufficient documentation

## 2018-01-31 LAB — CBC WITH DIFFERENTIAL/PLATELET
ABS IMMATURE GRANULOCYTES: 0.6 10*3/uL — AB (ref 0.00–0.07)
Basophils Absolute: 0.1 10*3/uL (ref 0.0–0.1)
Basophils Relative: 0 %
Eosinophils Absolute: 0 10*3/uL (ref 0.0–0.5)
Eosinophils Relative: 0 %
HCT: 29.1 % — ABNORMAL LOW (ref 36.0–46.0)
Hemoglobin: 8.7 g/dL — ABNORMAL LOW (ref 12.0–15.0)
Immature Granulocytes: 5 %
Lymphocytes Relative: 5 %
Lymphs Abs: 0.7 10*3/uL (ref 0.7–4.0)
MCH: 29.5 pg (ref 26.0–34.0)
MCHC: 29.9 g/dL — ABNORMAL LOW (ref 30.0–36.0)
MCV: 98.6 fL (ref 80.0–100.0)
Monocytes Absolute: 0.4 10*3/uL (ref 0.1–1.0)
Monocytes Relative: 3 %
NEUTROS ABS: 11.7 10*3/uL — AB (ref 1.7–7.7)
Neutrophils Relative %: 87 %
Platelets: 356 10*3/uL (ref 150–400)
RBC: 2.95 MIL/uL — ABNORMAL LOW (ref 3.87–5.11)
RDW: 16.1 % — ABNORMAL HIGH (ref 11.5–15.5)
WBC: 13.4 10*3/uL — ABNORMAL HIGH (ref 4.0–10.5)
nRBC: 0.1 % (ref 0.0–0.2)

## 2018-01-31 LAB — RENAL FUNCTION PANEL
Albumin: 2.3 g/dL — ABNORMAL LOW (ref 3.5–5.0)
Anion gap: 7 (ref 5–15)
BUN: 28 mg/dL — ABNORMAL HIGH (ref 8–23)
CO2: 35 mmol/L — ABNORMAL HIGH (ref 22–32)
Calcium: 8.2 mg/dL — ABNORMAL LOW (ref 8.9–10.3)
Chloride: 98 mmol/L (ref 98–111)
Creatinine, Ser: 0.68 mg/dL (ref 0.44–1.00)
GFR calc Af Amer: >60 mL/min (ref >60)
Glucose, Bld: 124 mg/dL — ABNORMAL HIGH (ref 70–99)
Phosphorus: 3.1 mg/dL (ref 2.5–4.6)
Potassium: 4.4 mmol/L (ref 3.5–5.1)
Sodium: 140 mmol/L (ref 135–145)

## 2018-01-31 LAB — PROTIME-INR
INR: 2.19
Prothrombin Time: 24 seconds — ABNORMAL HIGH (ref 11.4–15.2)

## 2018-01-31 MED ORDER — WARFARIN SODIUM 2.5 MG PO TABS
5.5000 mg | ORAL_TABLET | Freq: Once | ORAL | Status: AC
  Administered 2018-01-31: 5.5 mg via ORAL
  Filled 2018-01-31: qty 1

## 2018-01-31 NOTE — Progress Notes (Signed)
Pt eating. Nebulizer not given

## 2018-01-31 NOTE — Progress Notes (Signed)
ANTICOAGULATION CONSULT NOTE -follow up [name] for warfarin Indication: VTE treatment  Allergies  Allergen Reactions  . Dilaudid [Hydromorphone Hcl] Hives and Nausea Only  . Minocycline Hcl     REACTION: Dizzy  . Prednisone     REACTION: feels like throat swelling, hallucinations  . Varenicline Tartrate     REACTION: Dizzy(chantix)   . Zocor [Simvastatin - High Dose] Other (See Comments)    myalgia    Patient Measurements: Height: 5\' 5"  (165.1 cm) Weight: 170 lb 10.2 oz (77.4 kg) IBW/kg (Calculated) : 57   Vital Signs: Temp: 98.6 F (37 C) (01/23 0721) Temp Source: Oral (01/23 0721) BP: 123/56 (01/23 0721) Pulse Rate: 84 (01/23 0721)  Labs: Recent Labs    01/28/18 2330 01/29/18 0659 01/29/18 1306 01/29/18 1946 01/30/18 0546 01/31/18 0618  HGB 9.9*  --   --   --  8.5* 8.7*  HCT 33.9*  --   --   --  29.8* 29.1*  PLT 313  --   --   --  317 356  LABPROT 19.2*  --   --   --  23.1* 24.0*  INR 1.64  --   --   --  2.08 2.19  CREATININE 0.94  --   --   --   --  0.68  TROPONINI 0.09* 0.05* 0.05* 0.04*  --   --     Estimated Creatinine Clearance: 70.2 mL/min (by C-G formula based on SCr of 0.68 mg/dL).  Assessment: Pharmacy consulted to dose warfarin in patient with history of pulmonary embolism/DVT.  Today, INR is 2.19 and is in desired therapeutic goal range. Home dose of warfarin listed as 5.5 mg daily.  Goal of Therapy:  INR 2-3 Monitor platelets by anticoagulation protocol: Yes   Plan:   Repeat warfarin 5.5 mg x 1 dose today PT-INR daily Monitor s/s of bleeding  Despina Pole, Pharm. D. Clinical Pharmacist 01/31/2018 1:25 PM

## 2018-01-31 NOTE — Progress Notes (Signed)
PROGRESS NOTE  [name]  HAL:[id number]  DOB: 02/15/1950  DOA: 01/28/2018 PCP: [name], NP  Brief Admission Hx: [name] is a 68 y.o. female with medical history significant of chronic respiratory failure, COPD, hypercoagulable state presents with shortness of breath.    MDM/Assessment & Plan:   1. COPD Exacerbation - slowly improving, continue intensive hospital treatments with IV steroids, antibiotics, scheduled nebs, bipap if needed and supportive therapy.  Oxygen is being weaned down to home settings. Goal 88-92%.  2. Tobacco abuse - Pt strongly advised to please stop all tobacco use.  Nicotine patch ordered as needed for cravings.  3. History of VTE and Factor V Leiden - pt is chronically anticoagulated with warfarin.  Appreciate pharmacy for assisting with management.  4. CAD  - cycling troponins, no s/s of ACS.  Continue home medical management.  5. Leukocytosis - likely from steroids, WBC is trending down.   6. Traumatic and osteoporotic L3 fracture - Pt was recently seen by spine surgery at [facility] and surgery was not recommended.  Pt was placed on TLSO brace, pain management and PT recommended.  She was sent to SNF just prior to this admission.  Pt was supposed to have neurosurgery follow up with  Adele Dan NP in 1 week.  7. Pain management - Pt has duragesic patch.   8. Hypothyroidism - resumed home levothyroxine.   DVT prophylaxis: warfarin  Code Status: full  Family Communication: patient  Disposition Plan: SNF at discharge   Consultants:  Pharmacy   Procedures:    Antimicrobials:  Levofloxacin 1/21 >  Subjective: Pt reports that SOB is slightly improved but still very SOB with ambulation.    Objective: Vitals:   01/30/18 2035 01/30/18 2306 01/31/18 0721 01/31/18 0737  BP:  119/62 (!) 123/56   Pulse:  100 84   Resp:  20 18   Temp:  98.5 F (36.9 C) 98.6 F (37 C)   TempSrc:  Oral Oral   SpO2: 94% 97% 99% 99%  Weight:        Height:        Intake/Output Summary (Last 24 hours) at 01/31/2018 1205 Last data filed at 01/30/2018 2300 Gross per 24 hour  Intake 240 ml  Output 1800 ml  Net -1560 ml   Filed Weights   01/28/18 2231 01/29/18 1029 01/30/18 0522  Weight: 74.5 kg 76 kg 77.4 kg   REVIEW OF SYSTEMS  As per history otherwise all reviewed and reported negative  Exam:  General exam: awake, alert, NAD.  Respiratory system: diffuse wheezing slightly improved from prior exam.  Cardiovascular system: S1 & S2 heard, RRR. No JVD, murmurs, gallops, clicks or pedal edema. Gastrointestinal system: Abdomen is nondistended, soft and nontender. Normal bowel sounds heard. Central nervous system: Alert and oriented. No focal neurological deficits. Extremities: no CCE.  Data Reviewed: Basic Metabolic Panel: Recent Labs  Lab 01/25/18 0700 01/28/18 0644 01/28/18 2330 01/31/18 0618  NA 137 139 138 140  K 4.2 4.5 4.0 4.4  CL 95* 94* 93* 98  CO2 33* 38* 38* 35*  GLUCOSE 89 85 141* 124*  BUN 20 20 21  28*  CREATININE 0.75 0.81 0.94 0.68  CALCIUM 8.6* 8.7* 8.4* 8.2*  PHOS  --   --   --  3.1   Liver Function Tests: Recent Labs  Lab 01/28/18 2330 01/31/18 0618  AST 31  --   ALT 37  --   ALKPHOS 94  --   BILITOT 0.3  --  PROT 6.8  --   ALBUMIN 2.8* 2.3*   No results for input(s): LIPASE, AMYLASE in the last 168 hours. No results for input(s): AMMONIA in the last 168 hours. CBC: Recent Labs  Lab 01/25/18 0700 01/28/18 0644 01/28/18 2330 01/30/18 0546 01/31/18 0618  WBC 16.7* 24.7* 26.5* 16.2* 13.4*  NEUTROABS 13.1* 19.9* 20.9*  --  11.7*  HGB 11.8* 10.1* 9.9* 8.5* 8.7*  HCT 40.4 34.7* 33.9* 29.8* 29.1*  MCV 99.5 100.0 98.5 101.7* 98.6  PLT 170 285 313 317 356   Cardiac Enzymes: Recent Labs  Lab 01/28/18 2330 01/29/18 0659 01/29/18 1306 01/29/18 1946  TROPONINI 0.09* 0.05* 0.05* 0.04*   CBG (last 3)  Recent Labs    01/29/18 0836  GLUCAP 121*   Recent Results (from the past 240  hour(s))  MRSA PCR Screening     Status: None   Collection Time: 01/29/18  7:40 PM  Result Value Ref Range Status   MRSA by PCR NEGATIVE NEGATIVE Final    Comment:        The GeneXpert MRSA Assay (FDA approved for NASAL specimens only), is one component of a comprehensive MRSA colonization surveillance program. It is not intended to diagnose MRSA infection nor to guide or monitor treatment for MRSA infections. Performed at [facility], [address] [place], [place] [address]     Studies: No results found. Scheduled Meds: . arformoterol  15 mcg Nebulization BID  . baclofen  10 mg Oral TID  . budesonide  0.5 mg Nebulization BID  . fentaNYL  1 patch Transdermal Q72H  . ferrous sulfate  325 mg Oral Q breakfast  . ipratropium-albuterol  3 mL Nebulization QID  . levofloxacin  500 mg Oral Daily  . levothyroxine  125 mcg Oral Daily  . methylPREDNISolone (SOLU-MEDROL) injection  60 mg Intravenous Q6H  . nicotine  14 mg Transdermal Daily  . omega-3 acid ethyl esters  1 capsule Oral BID  . polyethylene glycol  17 g Oral Daily  . senna-docusate  2 tablet Oral BID  . ticagrelor  60 mg Oral BID  . Warfarin - Pharmacist Dosing Inpatient   Does not apply q1800   Continuous Infusions:  Active Problems:   Factor V Leiden, prothrombin gene mutation (HCC)   Chronic anticoagulation   COPD exacerbation (HCC)   Acute respiratory failure (HCC)   Vertebral fracture, osteoporotic (HCC)   Acute on chronic respiratory failure with hypoxia and hypercapnia (HCC)   Acute and chronic respiratory failure (acute-on-chronic) (HCC)   Pressure injury of skin  Time spent:   Irwin Brakeman, MD [facility]  01/31/2018, 12:05 PM    LOS: 2 days

## 2018-02-01 DIAGNOSIS — R7989 Other specified abnormal findings of blood chemistry: Secondary | ICD-10-CM | POA: Diagnosis not present

## 2018-02-01 DIAGNOSIS — R0602 Shortness of breath: Secondary | ICD-10-CM | POA: Diagnosis not present

## 2018-02-01 DIAGNOSIS — J9621 Acute and chronic respiratory failure with hypoxia: Secondary | ICD-10-CM | POA: Diagnosis not present

## 2018-02-01 DIAGNOSIS — J449 Chronic obstructive pulmonary disease, unspecified: Secondary | ICD-10-CM | POA: Diagnosis not present

## 2018-02-01 DIAGNOSIS — R0902 Hypoxemia: Secondary | ICD-10-CM | POA: Diagnosis not present

## 2018-02-01 DIAGNOSIS — Z743 Need for continuous supervision: Secondary | ICD-10-CM | POA: Diagnosis not present

## 2018-02-01 DIAGNOSIS — J9622 Acute and chronic respiratory failure with hypercapnia: Secondary | ICD-10-CM | POA: Diagnosis not present

## 2018-02-01 DIAGNOSIS — J9611 Chronic respiratory failure with hypoxia: Secondary | ICD-10-CM | POA: Diagnosis not present

## 2018-02-01 DIAGNOSIS — J439 Emphysema, unspecified: Secondary | ICD-10-CM | POA: Diagnosis not present

## 2018-02-01 DIAGNOSIS — J90 Pleural effusion, not elsewhere classified: Secondary | ICD-10-CM | POA: Diagnosis not present

## 2018-02-01 DIAGNOSIS — R5381 Other malaise: Secondary | ICD-10-CM | POA: Diagnosis not present

## 2018-02-01 DIAGNOSIS — Z7401 Bed confinement status: Secondary | ICD-10-CM | POA: Diagnosis not present

## 2018-02-01 DIAGNOSIS — M2550 Pain in unspecified joint: Secondary | ICD-10-CM | POA: Diagnosis not present

## 2018-02-01 DIAGNOSIS — F339 Major depressive disorder, recurrent, unspecified: Secondary | ICD-10-CM | POA: Diagnosis not present

## 2018-02-01 DIAGNOSIS — J810 Acute pulmonary edema: Secondary | ICD-10-CM | POA: Diagnosis not present

## 2018-02-01 DIAGNOSIS — J441 Chronic obstructive pulmonary disease with (acute) exacerbation: Secondary | ICD-10-CM | POA: Diagnosis not present

## 2018-02-01 DIAGNOSIS — M8008XD Age-related osteoporosis with current pathological fracture, vertebra(e), subsequent encounter for fracture with routine healing: Secondary | ICD-10-CM | POA: Diagnosis not present

## 2018-02-01 DIAGNOSIS — R739 Hyperglycemia, unspecified: Secondary | ICD-10-CM | POA: Diagnosis not present

## 2018-02-01 DIAGNOSIS — D6851 Activated protein C resistance: Secondary | ICD-10-CM | POA: Diagnosis not present

## 2018-02-01 DIAGNOSIS — I252 Old myocardial infarction: Secondary | ICD-10-CM | POA: Diagnosis not present

## 2018-02-01 DIAGNOSIS — J811 Chronic pulmonary edema: Secondary | ICD-10-CM | POA: Diagnosis not present

## 2018-02-01 DIAGNOSIS — Z720 Tobacco use: Secondary | ICD-10-CM | POA: Diagnosis not present

## 2018-02-01 DIAGNOSIS — R790 Abnormal level of blood mineral: Secondary | ICD-10-CM | POA: Diagnosis not present

## 2018-02-01 DIAGNOSIS — E039 Hypothyroidism, unspecified: Secondary | ICD-10-CM | POA: Diagnosis not present

## 2018-02-01 DIAGNOSIS — Z955 Presence of coronary angioplasty implant and graft: Secondary | ICD-10-CM | POA: Diagnosis not present

## 2018-02-01 DIAGNOSIS — I2699 Other pulmonary embolism without acute cor pulmonale: Secondary | ICD-10-CM | POA: Diagnosis not present

## 2018-02-01 DIAGNOSIS — M6281 Muscle weakness (generalized): Secondary | ICD-10-CM | POA: Diagnosis not present

## 2018-02-01 DIAGNOSIS — I2510 Atherosclerotic heart disease of native coronary artery without angina pectoris: Secondary | ICD-10-CM | POA: Diagnosis not present

## 2018-02-01 DIAGNOSIS — Z741 Need for assistance with personal care: Secondary | ICD-10-CM | POA: Diagnosis not present

## 2018-02-01 DIAGNOSIS — J189 Pneumonia, unspecified organism: Secondary | ICD-10-CM | POA: Diagnosis not present

## 2018-02-01 DIAGNOSIS — D682 Hereditary deficiency of other clotting factors: Secondary | ICD-10-CM | POA: Diagnosis not present

## 2018-02-01 DIAGNOSIS — D6869 Other thrombophilia: Secondary | ICD-10-CM | POA: Diagnosis not present

## 2018-02-01 DIAGNOSIS — Z86718 Personal history of other venous thrombosis and embolism: Secondary | ICD-10-CM | POA: Diagnosis not present

## 2018-02-01 DIAGNOSIS — R279 Unspecified lack of coordination: Secondary | ICD-10-CM | POA: Diagnosis not present

## 2018-02-01 DIAGNOSIS — R262 Difficulty in walking, not elsewhere classified: Secondary | ICD-10-CM | POA: Diagnosis not present

## 2018-02-01 DIAGNOSIS — D72829 Elevated white blood cell count, unspecified: Secondary | ICD-10-CM | POA: Diagnosis not present

## 2018-02-01 DIAGNOSIS — Z7901 Long term (current) use of anticoagulants: Secondary | ICD-10-CM | POA: Diagnosis not present

## 2018-02-01 DIAGNOSIS — R000 Tachycardia, unspecified: Secondary | ICD-10-CM | POA: Diagnosis not present

## 2018-02-01 DIAGNOSIS — S32039D Unspecified fracture of third lumbar vertebra, subsequent encounter for fracture with routine healing: Secondary | ICD-10-CM | POA: Diagnosis not present

## 2018-02-01 DIAGNOSIS — R05 Cough: Secondary | ICD-10-CM | POA: Diagnosis not present

## 2018-02-01 LAB — PROTIME-INR
INR: 2.23
PROTHROMBIN TIME: 24.4 s — AB (ref 11.4–15.2)

## 2018-02-01 MED ORDER — FENTANYL 50 MCG/HR TD PT72: 1.0000 | MEDICATED_PATCH | TRANSDERMAL | 0 refills | Status: DC

## 2018-02-01 MED ORDER — METHYLPREDNISOLONE 16 MG PO TABS: 16.0000 mg | ORAL_TABLET | Freq: Every day | ORAL | 0 refills | Status: AC

## 2018-02-01 MED ORDER — TICAGRELOR 60 MG PO TABS: 60.0000 mg | ORAL_TABLET | Freq: Two times a day (BID) | ORAL | Status: DC

## 2018-02-01 MED ORDER — HYDROCODONE-ACETAMINOPHEN 5-325 MG PO TABS: 1.0000 | ORAL_TABLET | Freq: Three times a day (TID) | ORAL | 0 refills | Status: AC | PRN

## 2018-02-01 MED ORDER — WARFARIN SODIUM 2.5 MG PO TABS: 5.5000 mg | ORAL_TABLET | Freq: Once | ORAL | Status: DC

## 2018-02-01 NOTE — Clinical Social Work Note (Signed)
Discharge clinicals sent to [facility]. Transportation arranged through [employer].  Message left for daughter advising of discharge.   LCSW signing off.     Nyeshia Mysliwiec, [name], LCSW

## 2018-02-01 NOTE — Evaluation (Signed)
Occupational Therapy Evaluation Patient Details Name: Hadlei Stitt MRN: [id number] DOB: 1950-06-27 Today's Date: 02/01/2018    History of Present Illness 68 y.o. female with medical history significant of COPD, chronic hypoxia on 2 to 3 L of oxygen, ongoing smoker, coronary artery disease, factor V [place] and history of pulmonary embolism on Coumadin.  She presented to the emergency room after a fall at home.  MRI revealed L3 compression fx.    Clinical Impression   Pt received supine in bed, agreeable to OT evaluation, requesting to sit up in chair this am. Pt reminded of back precautions and educated on log rolling to sit up, however ignores and uses bed rail to pull up. Pt performing seated ADLs with set-up, requiring minimal assist to manage TLSO. Min guard for functional transfers. Pt is primarily limited by pain and generalized weakness at this time. Recommend SNF on discharge to improve safety and independence in ADL completion. No further acute OT services required at this time.     Follow Up Recommendations  SNF;Supervision/Assistance - 24 hour    Equipment Recommendations  None recommended by OT       Precautions / Restrictions Precautions Precautions: Fall Required Braces or Orthoses: Spinal Brace Spinal Brace: Thoracolumbosacral orthotic;Applied in sitting position Restrictions Weight Bearing Restrictions: No      Mobility Bed Mobility Overal bed mobility: Needs Assistance Bed Mobility: Rolling;Supine to Sit;Sit to Supine Rolling: Supervision Sidelying to sit: Supervision       General bed mobility comments: increased time, educated patient on log roll technique, but patient unable to maintain precautions and instead uses bed rail to upright trunk  Transfers Overall transfer level: Needs assistance Equipment used: Rolling walker (2 wheeled) Transfers: Sit to/from [facility] Sit to Stand: Min guard Stand pivot transfers: Min guard                 ADL either performed or assessed with clinical judgement   ADL Overall ADL's : Needs assistance/impaired Eating/Feeding: Modified independent;Sitting               Upper Body Dressing : Supervision/safety;Sitting Upper Body Dressing Details (indicate cue type and reason): Pt donned TLSO in sitting with min assist for stabilizing while hooking straps     Toilet Transfer: Min guard;Ambulation;RW   Toileting- Water quality scientist and Hygiene: Min guard;Sit to/from stand               Vision Baseline Vision/History: No visual deficits Patient Visual Report: No change from baseline Vision Assessment?: No apparent visual deficits            Pertinent Vitals/Pain Pain Assessment: 0-10 Pain Score: 5  Pain Location: back with mobility Pain Descriptors / Indicators: Grimacing;Moaning;Guarding Pain Intervention(s): Limited activity within patient's tolerance;Monitored during session;Repositioned(TLSO applied)     Hand Dominance Right   Extremity/Trunk Assessment Upper Extremity Assessment Upper Extremity Assessment: Overall WFL for tasks assessed   Lower Extremity Assessment Lower Extremity Assessment: Defer to PT evaluation   Cervical / Trunk Assessment Cervical / Trunk Assessment: Normal Cervical / Trunk Exceptions: L3 comp fx in TLSO   Communication Communication Communication: No difficulties   Cognition Arousal/Alertness: Awake/alert Behavior During Therapy: WFL for tasks assessed/performed Overall Cognitive Status: Within Functional Limits for tasks assessed  Home Living Family/patient expects to be discharged to:: Skilled nursing facility Living Arrangements: Alone Available Help at Discharge: Family;Available PRN/intermittently Type of Home: House Home Access: Stairs to enter [employer] of Steps: 4 Entrance Stairs-Rails: Right;Left;Can reach both Home Layout:  One level     Bathroom Shower/Tub: Teacher, early years/pre: Standard     Home Equipment: Environmental consultant - 2 wheels;Cane - single point;Shower seat;Bedside commode          Prior Functioning/Environment Level of Independence: Independent with assistive device(s)        Comments: household ambulation without assistive device, uses [facility] for community: still drives; reports she has most recently been sponge bathing due to safety concerns with getting into/out of tub        OT Problem List: Decreased strength;Decreased range of motion;Decreased activity tolerance;Impaired balance (sitting and/or standing);Decreased knowledge of use of DME or AE;Decreased knowledge of precautions;Pain;Decreased cognition               Barriers to D/C: Decreased caregiver support                End of Session Equipment Utilized During Treatment: Gait belt;Rolling walker;Back brace;Oxygen  Activity Tolerance: Patient tolerated treatment well Patient left: in chair;with call bell/phone within reach;with chair alarm set  OT Visit Diagnosis: Other abnormalities of gait and mobility (R26.89);History of falling (Z91.81);Pain Pain - part of body: (back)                Time: 1638-4536 OT Time Calculation (min): 27 min Charges:  OT General Charges $OT Visit: 1 Visit OT Evaluation $OT Eval Low Complexity: [place], OTR/L  [phone] 02/01/2018, 8:29 AM

## 2018-02-01 NOTE — Care Management Important Message (Signed)
Important Message  Patient Details  Name: Allisyn Kunz MRN: [id number] Date of Birth: 02/20/1950   Medicare Important Message Given:  Yes    [name] 02/01/2018, 11:53 AM

## 2018-02-01 NOTE — Discharge Instructions (Signed)
PLEASE WEAR TLSO BRACE WITH AMBULATING AND SITTING UP IN CHAIR.  PLEASE FOLLOW UP WITH NEUROSURGERY NEXT WEEK FOR RECHECK.  SEEK MEDICAL CARE OR RETURN TO ER IF SYMPTOMS COME BACK, WORSEN OR NEW PROBLEM DEVELOPS. PLEASE MONITOR PT/INR EVERY 3 DAYS.

## 2018-02-01 NOTE — Progress Notes (Signed)
ANTICOAGULATION CONSULT NOTE -follow up [facility] for warfarin Indication: VTE treatment  Allergies  Allergen Reactions  . Dilaudid [Hydromorphone Hcl] Hives and Nausea Only  . Minocycline Hcl     REACTION: Dizzy  . Prednisone     REACTION: feels like throat swelling, hallucinations  . Varenicline Tartrate     REACTION: Dizzy(chantix)   . Zocor [Simvastatin - High Dose] Other (See Comments)    myalgia    Patient Measurements: Height: 5\' 5"  (165.1 cm) Weight: 170 lb 10.2 oz (77.4 kg) IBW/kg (Calculated) : 57   Vital Signs: Temp: 98.3 F (36.8 C) (01/24 0530) Temp Source: Oral (01/24 0530) BP: 138/72 (01/24 0530) Pulse Rate: 85 (01/24 0530)  Labs: Recent Labs    01/29/18 1306 01/29/18 1946 01/30/18 0546 01/31/18 0618 02/01/18 0618  HGB  --   --  8.5* 8.7*  --   HCT  --   --  29.8* 29.1*  --   PLT  --   --  317 356  --   LABPROT  --   --  23.1* 24.0* 24.4*  INR  --   --  2.08 2.19 2.23  CREATININE  --   --   --  0.68  --   TROPONINI 0.05* 0.04*  --   --   --     Estimated Creatinine Clearance: 70.2 mL/min (by C-G formula based on SCr of 0.68 mg/dL).  Assessment: Pharmacy consulted to dose warfarin in patient with history of pulmonary embolism/DVT.  Today, INR is 2.23 and is in desired therapeutic goal range.  Patient is also on Levaquin, which may possibly affect INR in the future.   Home dose of warfarin listed as 5.5 mg daily.  Goal of Therapy:  INR 2-3 Monitor platelets by anticoagulation protocol: Yes   Plan:   Give  warfarin 5.5 mg x 1 dose today PT-INR daily Monitor s/s of bleeding  Despina Pole, Pharm. D. Clinical Pharmacist 02/01/2018 11:48 AM

## 2018-02-01 NOTE — Progress Notes (Signed)
Discharge instructions called to [facility]: Clarise Cruz RN; all progress information given w/ vitals, hx, meds and treatments. Pt alert / oriented, no distress noted. IV d/c'd. Pt discharged via EMS w/ brace intact, all questions answered.

## 2018-02-01 NOTE — Discharge Summary (Signed)
Physician Discharge Summary  [name] BWL:[id number] DOB: 07-28-1950 DOA: 01/28/2018  PCP: [name], NP  Admit date: 01/28/2018 Discharge date: 02/01/2018  Admitted From: SNF Disposition: SNF  Recommendations for SNF PLEASE WEAR TLSO BRACE WITH AMBULATING AND SITTING UP IN CHAIR.  PLEASE FOLLOW UP WITH NEUROSURGERY NEXT WEEK FOR RECHECK.  SEEK MEDICAL CARE OR RETURN TO ER IF SYMPTOMS COME BACK, WORSEN OR NEW PROBLEM DEVELOPS. PLEASE MONITOR PT/INR EVERY 3 DAYS AND ADJUST WARFARIN DOSES AS NEEDED FOR GOAL INR 2-3.  PLEASE FOLLOW UP WITH PRIMARY CARE PROVIDER IN 2 WEEKS.   Discharge Condition: STABLE   CODE STATUS: FULL    Brief Hospitalization Summary: Please see all hospital notes, images, labs for full details of the hospitalization. Dr Charlie Pitter HPI: [name] is a 68 y.o. female with medical history significant of chronic respiratory failure, COPD, hypercoagulable state presents with shortness of breath.  Patient was admitted on January 16 and discharged to SNF.  Today she became acutely  short of breath.  They gave her some nebulizers there, she tried her flutter valve she was also given some p.o. Levaquin.[name]  Her cough is worsened.  She has been on a Medrol Dosepak as well there.  She continued to worsen so he brought her to the ED.  There was a mention that she might of been more confused than usual.  She was hypercapnic here.  She has been on BiPAP for many hours.  And finally she is no longer acidotic with an improved CO2.  At baseline she is on 3 L oxygen.  Patient had no acute infiltrate on her chest x-ray.   ED Course: Patient was given nebulizers, IV steroids, IV cefepime and vancomycin and was placed on BiPAP.  Finally her acidosis is improved and patient's mentation is improved.  Brief Admission Hx: [name] a 68 y.o.femalewith medical history significant ofchronic respiratory failure, COPD, hypercoagulable state presents with shortness of breath.     MDM/Assessment & Plan:   1. COPD Exacerbation - She is much improved after intensive hospital treatments with IV steroids, antibiotics, scheduled nebs, bipap if needed and supportive therapy.  Oxygen is weaned down to home settings. Goal pulse ox 88-92%.  Pt is stable to discharge to SNF.  She will discharge on Methylprednisone 16 mg daily with breakfast x 5 days.  Completed antibiotics in hospital.    2. Tobacco abuse - Pt strongly advised to please stop all tobacco use.  Nicotine patch ordered as needed for cravings.  3. History of VTE and Factor V Leiden - pt is chronically anticoagulated with warfarin.  Appreciate pharmacy for assisting with management. PLEASE CHECK PT/INR EVERY 3 DAYS AND ADJUST WARFARIN DOSE AS NEEDED FOR GOAL INR 2-3.  4. CAD  - cycled troponins, no s/s of ACS.  Continue home medical management.  5. Leukocytosis - likely from steroids, WBC is trending down.   6. Traumatic and osteoporotic L3 fracture - Pt was recently seen by spine surgery at [facility] and surgery was not recommended.  Pt to continue to wear TLSO brace, pain management and PT recommended SNF.  She was sent to SNF just prior to this admission.  Pt was supposed to have neurosurgery follow up with  Adele Dan NP in 1 week.  7. Pain management - Pt has duragesic patch and breakthrough pain managed with hydrocodone/APAP.  8. Hypothyroidism - resumed home levothyroxine.   DVT prophylaxis: warfarin  Code Status: full  Family Communication: patient  Disposition Plan: SNF at  discharge   Consultants:  Pharmacy   Procedures:    Antimicrobials:  Levofloxacin 1/21 >1/24  Discharge Diagnoses:  Active Problems:   Factor V Leiden, prothrombin gene mutation (HCC)   Chronic anticoagulation   COPD exacerbation (HCC)   Acute respiratory failure (HCC)   Vertebral fracture, osteoporotic (HCC)   Acute on chronic respiratory failure with hypoxia and hypercapnia (HCC)   Acute and chronic respiratory  failure (acute-on-chronic) (HCC)   Pressure injury of skin  Discharge Instructions: Discharge Instructions    Call MD for:  difficulty breathing, headache or visual disturbances   Complete by:  As directed    Call MD for:  persistant nausea and vomiting   Complete by:  As directed    Call MD for:  severe uncontrolled pain   Complete by:  As directed    Increase activity slowly   Complete by:  As directed      Allergies as of 02/01/2018      Reactions   Dilaudid [hydromorphone Hcl] Hives, Nausea Only   Minocycline Hcl    REACTION: Dizzy   Prednisone    REACTION: feels like throat swelling, hallucinations   Varenicline Tartrate    REACTION: Dizzy(chantix)   Zocor [simvastatin - High Dose] Other (See Comments)   myalgia      Medication List    STOP taking these medications   levofloxacin 500 MG tablet Commonly known as:  LEVAQUIN   methylPREDNISolone 4 MG Tbpk tablet Commonly known as:  MEDROL DOSEPAK Replaced by:  methylPREDNISolone 16 MG tablet     TAKE these medications   albuterol (2.5 MG/3ML) 0.083% nebulizer solution Commonly known as:  PROVENTIL USE 1 VIAL BY NEBULIZER EVERY 4 HOURS AS NEEDED FOR WHEEZING. DX: J44.9 What changed:    how much to take  how to take this  when to take this  reasons to take this  additional instructions   arformoterol 15 MCG/2ML Nebu Commonly known as:  BROVANA Take 2 mLs (15 mcg total) by nebulization 2 (two) times daily.   baclofen 10 MG tablet Commonly known as:  LIORESAL Take 1 tablet (10 mg total) by mouth 3 (three) times daily.   benzonatate 100 MG capsule Commonly known as:  TESSALON Take 1 capsule (100 mg total) by mouth 2 (two) times daily as needed for cough.   budesonide 0.5 MG/2ML nebulizer solution Commonly known as:  PULMICORT Take 2 mLs (0.5 mg total) by nebulization 2 (two) times daily. Dx: J43.9   fentaNYL 50 MCG/HR Commonly known as:  [place]  1 patch onto the skin every 3 (three) days.    ferrous sulfate 325 (65 FE) MG tablet Take 325 mg by mouth daily with breakfast.   Fish Oil 600 MG Caps Take 1 capsule by mouth 3 (three) times daily.   FLUTTER Devi Use as directed   HYDROcodone-acetaminophen 5-325 MG tablet Commonly known as:  NORCO/VICODIN Take 1 tablet by mouth every 8 (eight) hours as needed for up to 3 days for severe pain. What changed:  reasons to take this   ipratropium-albuterol 0.5-2.5 (3) MG/3ML Soln Commonly known as:  DUONEB Take 3 mLs by nebulization 4 (four) times daily.   levothyroxine 125 MCG tablet Commonly known as:  SYNTHROID, LEVOTHROID Take 1 tablet (125 mcg total) by mouth daily.   lidocaine 5 % Commonly known as:  LIDODERM Place 1 patch onto the skin daily. Remove & Discard patch within 12 hours or as directed by MD   methylPREDNISolone 16 MG tablet  Commonly known as:  MEDROL Take 1 tablet (16 mg total) by mouth daily with breakfast for 5 days. Replaces:  methylPREDNISolone 4 MG Tbpk tablet   nicotine 14 mg/24hr patch Commonly known as:  NICODERM CQ - dosed in mg/24 hours Place 1 patch (14 mg total) onto the skin daily.   nitroGLYCERIN 0.4 MG SL tablet Commonly known as:  NITROSTAT Place 1 tablet (0.4 mg total) under the tongue every 5 (five) minutes as needed for chest pain.   OXYGEN Inhale 3 L into the lungs continuous.   polyethylene glycol packet Commonly known as:  MIRALAX / GLYCOLAX Take 17 g by mouth daily.   senna-docusate 8.6-50 MG tablet Commonly known as:  Senokot-S Take 2 tablets by mouth 2 (two) times daily.   ticagrelor 60 MG Tabs tablet Commonly known as:  BRILINTA Take 1 tablet (60 mg total) by mouth 2 (two) times daily.   traZODone 50 MG tablet Commonly known as:  DESYREL Take 50 mg by mouth at bedtime as needed for sleep.   warfarin 1 MG tablet Commonly known as:  COUMADIN Take as directed. If you are unsure how to take this medication, talk to your nurse or doctor. Original instructions:  Take  0.5 mg by mouth See admin instructions. Give 0.5 mg by mouth along with 5 mg to = 5.5 mg once a day   warfarin 5 MG tablet Commonly known as:  COUMADIN Take as directed. If you are unsure how to take this medication, talk to your nurse or doctor. Original instructions:  Take 5 mg by mouth See admin instructions. Give 5 mg by mouth along with 0.5 mg to = 5.5 mg once a day       Contact information for follow-up providers    Meyran, Ocie Cornfield, NP. Schedule an appointment as soon as possible for a visit in 1 week(s).   Specialty:  Neurosurgery Why:  Neurosurgery Follow Up for Back Injury Contact information: [address] [place] [address] [phone]            Contact information for after-discharge care    [facility] .   Service:  Skilled Nursing Contact information: [address] [address] [phone]                 Allergies  Allergen Reactions  . Dilaudid [Hydromorphone Hcl] Hives and Nausea Only  . Minocycline Hcl     REACTION: Dizzy  . Prednisone     REACTION: feels like throat swelling, hallucinations  . Varenicline Tartrate     REACTION: Dizzy(chantix)   . Zocor [Simvastatin - High Dose] Other (See Comments)    myalgia   Allergies as of 02/01/2018      Reactions   Dilaudid [hydromorphone Hcl] Hives, Nausea Only   Minocycline Hcl    REACTION: Dizzy   Prednisone    REACTION: feels like throat swelling, hallucinations   Varenicline Tartrate    REACTION: Dizzy(chantix)   Zocor [simvastatin - High Dose] Other (See Comments)   myalgia      Medication List    STOP taking these medications   levofloxacin 500 MG tablet Commonly known as:  LEVAQUIN   methylPREDNISolone 4 MG Tbpk tablet Commonly known as:  MEDROL DOSEPAK Replaced by:  methylPREDNISolone 16 MG tablet     TAKE these medications   albuterol (2.5 MG/3ML) 0.083% nebulizer solution Commonly known  as:  PROVENTIL USE 1  VIAL BY NEBULIZER EVERY 4 HOURS AS NEEDED FOR WHEEZING. DX: J44.9 What changed:    how much to take  how to take this  when to take this  reasons to take this  additional instructions   arformoterol 15 MCG/2ML Nebu Commonly known as:  BROVANA Take 2 mLs (15 mcg total) by nebulization 2 (two) times daily.   baclofen 10 MG tablet Commonly known as:  LIORESAL Take 1 tablet (10 mg total) by mouth 3 (three) times daily.   benzonatate 100 MG capsule Commonly known as:  TESSALON Take 1 capsule (100 mg total) by mouth 2 (two) times daily as needed for cough.   budesonide 0.5 MG/2ML nebulizer solution Commonly known as:  PULMICORT Take 2 mLs (0.5 mg total) by nebulization 2 (two) times daily. Dx: J43.9   fentaNYL 50 MCG/HR Commonly known as:  Reform 1 patch onto the skin every 3 (three) days.   ferrous sulfate 325 (65 FE) MG tablet Take 325 mg by mouth daily with breakfast.   Fish Oil 600 MG Caps Take 1 capsule by mouth 3 (three) times daily.   FLUTTER Devi Use as directed   HYDROcodone-acetaminophen 5-325 MG tablet Commonly known as:  NORCO/VICODIN Take 1 tablet by mouth every 8 (eight) hours as needed for up to 3 days for severe pain. What changed:  reasons to take this   ipratropium-albuterol 0.5-2.5 (3) MG/3ML Soln Commonly known as:  DUONEB Take 3 mLs by nebulization 4 (four) times daily.   levothyroxine 125 MCG tablet Commonly known as:  SYNTHROID, LEVOTHROID Take 1 tablet (125 mcg total) by mouth daily.   lidocaine 5 % Commonly known as:  LIDODERM Place 1 patch onto the skin daily. Remove & Discard patch within 12 hours or as directed by MD   methylPREDNISolone 16 MG tablet Commonly known as:  MEDROL Take 1 tablet (16 mg total) by mouth daily with breakfast for 5 days. Replaces:  methylPREDNISolone 4 MG Tbpk tablet   nicotine 14 mg/24hr patch Commonly known as:  NICODERM CQ - dosed in mg/24 hours Place 1 patch (14 mg  total) onto the skin daily.   nitroGLYCERIN 0.4 MG SL tablet Commonly known as:  NITROSTAT Place 1 tablet (0.4 mg total) under the tongue every 5 (five) minutes as needed for chest pain.   OXYGEN Inhale 3 L into the lungs continuous.   polyethylene glycol packet Commonly known as:  MIRALAX / GLYCOLAX Take 17 g by mouth daily.   senna-docusate 8.6-50 MG tablet Commonly known as:  Senokot-S Take 2 tablets by mouth 2 (two) times daily.   ticagrelor 60 MG Tabs tablet Commonly known as:  BRILINTA Take 1 tablet (60 mg total) by mouth 2 (two) times daily.   traZODone 50 MG tablet Commonly known as:  DESYREL Take 50 mg by mouth at bedtime as needed for sleep.   warfarin 1 MG tablet Commonly known as:  COUMADIN Take as directed. If you are unsure how to take this medication, talk to your nurse or doctor. Original instructions:  Take 0.5 mg by mouth See admin instructions. Give 0.5 mg by mouth along with 5 mg to = 5.5 mg once a day   warfarin 5 MG tablet Commonly known as:  COUMADIN Take as directed. If you are unsure how to take this medication, talk to your nurse or doctor. Original instructions:  Take 5 mg by mouth See admin instructions. Give 5 mg by mouth along with 0.5 mg to = 5.5 mg once  a day       Procedures/Studies: Dg Chest 2 View  Result Date: 01/28/2018 CLINICAL DATA:  Shortness of breath EXAM: CHEST - 2 VIEW COMPARISON:  01/20/2017, 09/19/2017, CT chest 11/14/2017 FINDINGS: CP angle blunting/scarring is unchanged. Posterior lower lung spiculation, corresponding to CT demonstrated scarring. Emphysematous disease. Diffuse increased interstitial opacity right greater than left consistent with interstitial inflammatory process or edema. Stable cardiomediastinal silhouette. No pneumothorax. IMPRESSION: 1. Stable CP angle scarring and left lower lobe posterior pleural and parenchymal scarring 2. Emphysematous disease 3. Interval diffuse interstitial opacity right greater than  left which may reflect interstitial inflammatory process or pulmonary edema Electronically Signed   By: Donavan Foil M.D.   On: 01/28/2018 23:04   Dg Lumbar Spine Complete  Result Date: 01/19/2018 CLINICAL DATA:  [place] tonight with pop in the low back. Low back pain. EXAM: LUMBAR SPINE - COMPLETE 4+ VIEW COMPARISON:  None. FINDINGS: Examination is technically limited due to multiple superimposed leads and positioning. Normal alignment of the lumbar spine. Normal alignment of the lumbar vertebrae. No vertebral compression deformities. No focal bone lesion or bone destruction. Mild disc space narrowing and endplate hypertrophic changes consistent with mild degenerative change. Visualized sacrum appears intact. IMPRESSION: Mild degenerative changes in the lumbar spine. Normal alignment. No acute displaced fractures identified. Electronically Signed   By: Lucienne Capers M.D.   On: 01/19/2018 22:26   Dg Pelvis 1-2 Views  Result Date: 01/19/2018 CLINICAL DATA:  Patient fell tonight, feeling a pop in the low back. Now low back pain. EXAM: PELVIS - 1-2 VIEW COMPARISON:  None. FINDINGS: There is no evidence of pelvic fracture or diastasis. No pelvic bone lesions are seen. IMPRESSION: Negative. Electronically Signed   By: Lucienne Capers M.D.   On: 01/19/2018 22:24   Mr Lumbar Spine Wo Contrast  Result Date: 01/20/2018 CLINICAL DATA:  Fall yesterday.  Pain. EXAM: MRI LUMBAR SPINE WITHOUT CONTRAST TECHNIQUE: Multiplanar, multisequence MR imaging of the lumbar spine was performed. No intravenous contrast was administered. COMPARISON:  Lumbar spine radiographs 01/19/2018 FINDINGS: Segmentation: 5 non rib-bearing lumbar type vertebral bodies are present. The lowest fully formed vertebral body is L5. Alignment:  AP alignment is anatomic. Vertebrae: Remote superior endplate fractures are present at L1 and L5. A superior endplate fracture of L3 on the right demonstrates marrow signal change battle with acute/subacute  fracture. There is 30% loss of height anteriorly. Stir sequences demonstrate edema extending into the right pedicle. There is no retropulsed bone. Conus medullaris and cauda equina: Conus extends to the L1-2 level. Conus and cauda equina appear normal. Paraspinal and other soft tissues: Limited imaging the abdomen is unremarkable. There is no significant adenopathy. No solid organ lesions are present. Disc levels: L1-2: Negative. L2-3: Mild disc bulging is present without significant stenosis. L3-4: Mild disc bulging is present. There is no significant stenosis. L4-5: A broad-based disc protrusion is present. Mild facet hypertrophy is present bilaterally. Mild subarticular and foraminal narrowing is present bilaterally. L5-S1: A shallow central disc protrusion is present. The central canal is patent. Foramina are patent bilaterally. IMPRESSION: 1. Acute/subacute superior endplate fracture of L3 on the right with approximately 30% loss of height. No retropulsed bone or associated stenosis. 2. Remote superior endplate fractures at L1 and L5. 3. Broad-based disc protrusion at L4-5 with mild subarticular and foraminal stenosis bilaterally. Electronically Signed   By: San Morelle M.D.   On: 01/20/2018 06:51   Dg Chest Portable 1 View  Result Date: 01/20/2018 CLINICAL DATA:  Pain. EXAM: PORTABLE CHEST 1 VIEW COMPARISON:  09/19/2017 FINDINGS: 1122 hours. The cardio pericardial silhouette is enlarged. There is pulmonary vascular congestion without overt pulmonary edema. Interstitial markings are diffusely coarsened with chronic features. Hazy opacity in the right suprahilar lung may be related to positioning. The visualized bony structures of the thorax are intact. IMPRESSION: Hazy opacity suprahilar right lung may be related to positioning. Dedicated upright PA film recommended when patient is able, to further evaluate. Electronically Signed   By: Misty Stanley M.D.   On: 01/20/2018 13:47       Subjective: Pt sitting up in chair, she has been ambulating, oxygen weaned down to home level and Pt feeling better, no CP and no SOB. Pt is wearing TLSO brace with ambulation.   Discharge Exam: Vitals:   02/01/18 0530 02/01/18 0953  BP: 138/72   Pulse: 85   Resp: 18   Temp: 98.3 F (36.8 C)   SpO2: 99% 90%   Vitals:   01/31/18 1936 01/31/18 2108 02/01/18 0530 02/01/18 0953  BP:  (!) 120/59 138/72   Pulse:  98 85   Resp:  18 18   Temp:  99 F (37.2 C) 98.3 F (36.8 C)   TempSrc:  Oral Oral   SpO2: 95% 95% 99% 90%  Weight:      Height:       General exam: awake, alert, NAD.  Respiratory system: good air movement bilateral, rare exp wheezing, no rales.   Cardiovascular system: S1 & S2 heard, RRR. No JVD, murmurs, gallops, clicks or pedal edema. Gastrointestinal system: Abdomen is nondistended, soft and nontender. Normal bowel sounds heard. Central nervous system: Alert and oriented. No focal neurological deficits. Extremities: no CCE.   The results of significant diagnostics from this hospitalization (including imaging, microbiology, ancillary and laboratory) are listed below for reference.     Microbiology: Recent Results (from the past 240 hour(s))  MRSA PCR Screening     Status: None   Collection Time: 01/29/18  7:40 PM  Result Value Ref Range Status   MRSA by PCR NEGATIVE NEGATIVE Final    Comment:        The GeneXpert MRSA Assay (FDA approved for NASAL specimens only), is one component of a comprehensive MRSA colonization surveillance program. It is not intended to diagnose MRSA infection nor to guide or monitor treatment for MRSA infections. Performed at [facility], [address] [place], [place] [address]      Labs: BNP (last 3 results) Recent Labs    02/19/17 1540 01/28/18 2330  BNP 121.0* 220.2*   Basic Metabolic Panel: Recent Labs  Lab 01/28/18 0644 01/28/18 2330 01/31/18 0618  NA 139 138 140  K 4.5 4.0 4.4  CL 94* 93* 98  CO2 38*  38* 35*  GLUCOSE 85 141* 124*  BUN 20 21 28*  CREATININE 0.81 0.94 0.68  CALCIUM 8.7* 8.4* 8.2*  PHOS  --   --  3.1   Liver Function Tests: Recent Labs  Lab 01/28/18 2330 01/31/18 0618  AST 31  --   ALT 37  --   ALKPHOS 94  --   BILITOT 0.3  --   PROT 6.8  --   ALBUMIN 2.8* 2.3*   No results for input(s): LIPASE, AMYLASE in the last 168 hours. No results for input(s): AMMONIA in the last 168 hours. CBC: Recent Labs  Lab 01/28/18 0644 01/28/18 2330 01/30/18 0546 01/31/18 0618  WBC 24.7* 26.5* 16.2* 13.4*  NEUTROABS 19.9* 20.9*  --  11.7*  HGB 10.1* 9.9* 8.5* 8.7*  HCT 34.7* 33.9* 29.8* 29.1*  MCV 100.0 98.5 101.7* 98.6  PLT 285 313 317 356   Cardiac Enzymes: Recent Labs  Lab 01/28/18 2330 01/29/18 0659 01/29/18 1306 01/29/18 1946  TROPONINI 0.09* 0.05* 0.05* 0.04*   BNP: Invalid input(s): POCBNP CBG: Recent Labs  Lab 01/29/18 0836  GLUCAP 121*   D-Dimer No results for input(s): DDIMER in the last 72 hours. Hgb A1c No results for input(s): HGBA1C in the last 72 hours. Lipid Profile No results for input(s): CHOL, HDL, LDLCALC, TRIG, CHOLHDL, LDLDIRECT in the last 72 hours. Thyroid function studies No results for input(s): TSH, T4TOTAL, T3FREE, THYROIDAB in the last 72 hours.  Invalid input(s): FREET3 Anemia work up No results for input(s): VITAMINB12, FOLATE, FERRITIN, TIBC, IRON, RETICCTPCT in the last 72 hours. Urinalysis    Component Value Date/Time   COLORURINE YELLOW 01/19/2018 2130   APPEARANCEUR HAZY (A) 01/19/2018 2130   LABSPEC 1.019 01/19/2018 2130   PHURINE 6.0 01/19/2018 2130   GLUCOSEU NEGATIVE 01/19/2018 2130   HGBUR LARGE (A) 01/19/2018 2130   BILIRUBINUR NEGATIVE 01/19/2018 2130   BILIRUBINUR n 09/21/2016 1432   KETONESUR NEGATIVE 01/19/2018 2130   PROTEINUR 100 (A) 01/19/2018 2130   UROBILINOGEN 0.2 09/21/2016 1432   UROBILINOGEN 0.2 04/13/2009 2025   NITRITE NEGATIVE 01/19/2018 2130   LEUKOCYTESUR NEGATIVE 01/19/2018 2130    Sepsis Labs Invalid input(s): PROCALCITONIN,  WBC,  LACTICIDVEN Microbiology Recent Results (from the past 240 hour(s))  MRSA PCR Screening     Status: None   Collection Time: 01/29/18  7:40 PM  Result Value Ref Range Status   MRSA by PCR NEGATIVE NEGATIVE Final    Comment:        The GeneXpert MRSA Assay (FDA approved for NASAL specimens only), is one component of a comprehensive MRSA colonization surveillance program. It is not intended to diagnose MRSA infection nor to guide or monitor treatment for MRSA infections. Performed at [facility], [address] [place], [place] [address]    Time coordinating discharge: 32 minutes   SIGNED:  Irwin Brakeman, MD  [facility] 02/01/2018, 11:50 AM

## 2018-02-02 DIAGNOSIS — J810 Acute pulmonary edema: Secondary | ICD-10-CM | POA: Diagnosis not present

## 2018-02-02 DIAGNOSIS — R790 Abnormal level of blood mineral: Secondary | ICD-10-CM | POA: Diagnosis not present

## 2018-02-02 DIAGNOSIS — J90 Pleural effusion, not elsewhere classified: Secondary | ICD-10-CM | POA: Diagnosis not present

## 2018-02-02 DIAGNOSIS — J449 Chronic obstructive pulmonary disease, unspecified: Secondary | ICD-10-CM | POA: Diagnosis not present

## 2018-02-02 DIAGNOSIS — R0602 Shortness of breath: Secondary | ICD-10-CM | POA: Diagnosis not present

## 2018-02-02 DIAGNOSIS — J811 Chronic pulmonary edema: Secondary | ICD-10-CM | POA: Diagnosis not present

## 2018-02-02 DIAGNOSIS — R7989 Other specified abnormal findings of blood chemistry: Secondary | ICD-10-CM | POA: Diagnosis not present

## 2018-02-02 DIAGNOSIS — J189 Pneumonia, unspecified organism: Secondary | ICD-10-CM | POA: Diagnosis not present

## 2018-02-04 ENCOUNTER — Encounter: Payer: Self-pay | Admitting: Adult Health

## 2018-02-04 LAB — CBC AND DIFFERENTIAL
HCT: 31 — AB (ref 36–46)
HEMOGLOBIN: 10.3 — AB (ref 12.0–16.0)
Platelets: 364 (ref 150–399)
WBC: 12

## 2018-02-04 LAB — PROTIME-INR

## 2018-02-04 LAB — BASIC METABOLIC PANEL
BUN: 21 (ref 4–21)
CREATININE: 0.7 (ref 0.5–1.1)
Glucose: 92
Potassium: 4.3 (ref 3.4–5.3)
Sodium: 142 (ref 137–147)

## 2018-02-04 LAB — TSH: TSH: 51.08 — AB (ref 0.41–5.90)

## 2018-02-06 ENCOUNTER — Telehealth: Payer: Self-pay | Admitting: Pulmonary Disease

## 2018-02-06 ENCOUNTER — Ambulatory Visit: Payer: Medicare Other | Admitting: Cardiovascular Disease

## 2018-02-06 NOTE — Telephone Encounter (Signed)
LVMTCB x 1 on [phone].

## 2018-02-06 NOTE — Telephone Encounter (Signed)
LVM for patient to return call. X1 

## 2018-02-06 NOTE — Telephone Encounter (Signed)
Spoke with Estill Bamberg at [facility], states that pt called her stating that she broke her back and is currently at the [facility], but that they are not dispensing her medications correctly.  Therefore, she is experiencing increased sob, "breathing problems" per Estill Bamberg.    Pt had Estill Bamberg call our office as pt did not have our number.  Pt is requesting that we call her cell phone at [phone] to help figure this out.  Routing to triage to follow up on.

## 2018-02-06 NOTE — Telephone Encounter (Signed)
Pt called back, states she fell 1/11 and broke her back- is now at the [facility] in [place].  Wants to discuss her breathing- states that they are not giving her her normal meds and is now having increased sob.  Requesting recs.    CB: [phone]

## 2018-02-07 NOTE — Telephone Encounter (Signed)
LVMTCB x 2. Requested in message for the patient to advise if detail can be left in vmail if she is not able to be reached.

## 2018-02-08 LAB — POCT INR: INR: 2 — AB (ref 0.9–1.1)

## 2018-02-08 NOTE — Telephone Encounter (Signed)
Called and left message on VM to call back. 

## 2018-02-09 DIAGNOSIS — D682 Hereditary deficiency of other clotting factors: Secondary | ICD-10-CM | POA: Diagnosis not present

## 2018-02-09 DIAGNOSIS — M8008XD Age-related osteoporosis with current pathological fracture, vertebra(e), subsequent encounter for fracture with routine healing: Secondary | ICD-10-CM | POA: Diagnosis not present

## 2018-02-09 DIAGNOSIS — J449 Chronic obstructive pulmonary disease, unspecified: Secondary | ICD-10-CM | POA: Diagnosis not present

## 2018-02-09 DIAGNOSIS — I2510 Atherosclerotic heart disease of native coronary artery without angina pectoris: Secondary | ICD-10-CM | POA: Diagnosis not present

## 2018-02-11 LAB — IRON,TIBC AND FERRITIN PANEL
Iron: 21
TIBC: 252
UIBC: 232

## 2018-02-11 LAB — VITAMIN D 25 HYDROXY (VIT D DEFICIENCY, FRACTURES): Vit D, 25-Hydroxy: 11.25

## 2018-02-11 NOTE — Telephone Encounter (Signed)
Called and left message on VM to call back. 

## 2018-02-13 ENCOUNTER — Ambulatory Visit: Payer: Medicare Other | Admitting: Student

## 2018-02-13 DIAGNOSIS — D682 Hereditary deficiency of other clotting factors: Secondary | ICD-10-CM | POA: Diagnosis not present

## 2018-02-13 DIAGNOSIS — J449 Chronic obstructive pulmonary disease, unspecified: Secondary | ICD-10-CM | POA: Diagnosis not present

## 2018-02-13 DIAGNOSIS — I2510 Atherosclerotic heart disease of native coronary artery without angina pectoris: Secondary | ICD-10-CM | POA: Diagnosis not present

## 2018-02-13 DIAGNOSIS — M8008XD Age-related osteoporosis with current pathological fracture, vertebra(e), subsequent encounter for fracture with routine healing: Secondary | ICD-10-CM | POA: Diagnosis not present

## 2018-02-13 NOTE — Progress Notes (Deleted)
Cardiology Office Note    Date:  02/13/2018   ID:  [name], DOB July 23, 1950, MRN [id number]  PCP:  [name], NP  Cardiologist: Kate Sable, MD EPS: None  No chief complaint on file.   History of Present Illness:  [name] is a 68 y.o. female with history of CAD status post STEMI 2002 treated with DES to the RCA with in-stent restenosis 2003 treated with cutting balloon and brachytherapy, acute in-stent thrombosis 06/2010 treated with BMS and thrombectomy.  Plavix nonresponder and placed on Brilinta last cath in 2013 reassuring with minor CAD, history of PE with factor V Leiden deficiency on Coumadin, COPD with ongoing tobacco abuse, home O2.  2D echo 02/2017 normal LV function with grade 2 DD.  Last seen in our office by Kerin Ransom, PA-C 05/16/2017 when she needs surgical clearance for spinal injection and required a Lovenox bridge.  She was in the hospital twice in January 1 time with a fall on the second time with COPD exacerbation.  Past Medical History:  Diagnosis Date  . Acute MI ([place])    x4, code blue x3  . Arteriosclerotic cardiovascular disease (ASCVD) 2002   Inf STEMI-2002. 2003-cutting balloon + brachytherapy for restenosis; subsequent acute stent thrombosis 06/2010 requiring 2 separate interventions (Zeta stent, then repeat cath with thrombectomy). focal basal inf AK, nl EF; 03/2011: Patent stents, minor nonobst  residual dz, nl EF; neg stress nuclear in 2008 and stress echo in 2009  . Chronic anticoagulation    Warfarin plus ticagrelor  . Chronic respiratory failure ([place]) 08/27/2013   On 2L 02  . COPD (chronic obstructive pulmonary disease) ([place])    02 dependent  . COPD (chronic obstructive pulmonary disease) ([place])   . Factor 5 Leiden mutation, heterozygous ([place])   . Factor V Leiden, prothrombin gene mutation ([place]) 2006  . Hyperlipidemia   . Hypothyroidism   . Noncompliance   . Pelvic fracture ([place]) 2009  . Pulmonary embolism ([place]) 2006   Associated with deep vein thrombosis-2006; + factor V Leiden  . Tobacco abuse    50 pack years    Past Surgical History:  Procedure Laterality Date  . COLONOSCOPY  Approximately 2000   Negative screening study  . CORONARY ANGIOPLASTY  2002, 2003, 2012  . LEFT AND RIGHT HEART CATHETERIZATION WITH CORONARY ANGIOGRAM N/A 04/03/2011   Procedure: LEFT AND RIGHT HEART CATHETERIZATION WITH CORONARY ANGIOGRAM;  Surgeon: Sherren Mocha, MD;  Location: [facility] CATH LAB;  Service: Cardiovascular;  Laterality: N/A;    Current Medications: No outpatient medications have been marked as taking for the 02/18/18 encounter (Appointment) with Imogene Burn, PA-C.     Allergies:   Dilaudid [hydromorphone hcl]; Minocycline hcl; Prednisone; Varenicline tartrate; and Zocor [simvastatin - high dose]   Social History   Socioeconomic History  . Marital status: Single    Spouse name: Not on file  . Number of children: Not on file  . Years of education: Not on file  . Highest education level: Not on file  Occupational History  . Occupation: Disabled  . 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 Financial resource strain: Not on file  . Food insecurity:    Worry: Not on file    Inability: Not on file  . Transportation needs:    Medical: Not on file    Non-medical: Not on file  Tobacco Use  . Smoking status: Current Some Day Smoker    Packs/day: 0.50    Years: 50.00  Pack years: 25.00    Types: Cigarettes    Start date: 05/10/1968  . Smokeless tobacco: Never Used  Substance and Sexual Activity  . Alcohol use: No    Alcohol/week: 0.0 standard drinks  . Drug use: No  . Sexual activity: Not on file  Lifestyle  . Physical activity:    Days per week: Not on file    Minutes per session: Not on file  . Stress: Not on file  Relationships  . Social connections:    Talks on phone: Not on file    Gets together: Not on file    Attends religious service: Not on file    Active member of club or  organization: Not on file    Attends meetings of clubs or organizations: Not on file    Relationship status: Not on file  Other Topics Concern  . Not on file  Social History Narrative   Originally from [place]. Previously has lived in [place], [place], [place] & moved back to [place] in 1994. 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 Ronie Spies was in her current home. She also had an owl living in her house as a rehab pet. She also had chickens until Fall 2015. No mold exposure.      Family History:  The patient's ***family history includes Alzheimer's disease in her mother; Emphysema in her mother and sister; Factor V Leiden deficiency in her daughter, father, and sister; Heart disease in her father; Kidney cancer in her paternal grandmother.   ROS:   Please see the history of present illness.    ROS All other systems reviewed and are negative.   PHYSICAL EXAM:   VS:  There were no vitals taken for this visit.  Physical Exam  GEN: Well nourished, well developed, in no acute distress  HEENT: normal  Neck: no JVD, carotid bruits, or masses Cardiac:RRR; no murmurs, rubs, or gallops  Respiratory:  clear to auscultation bilaterally, normal work of breathing GI: soft, nontender, nondistended, + BS Ext: without cyanosis, clubbing, or edema, Good distal pulses bilaterally MS: no deformity or atrophy  Skin: warm and dry, no rash Neuro:  Alert and Oriented x 3, Strength and sensation are intact Psych: euthymic mood, full affect  Wt Readings from Last 3 Encounters:  01/30/18 170 lb 10.2 oz (77.4 kg)  01/23/18 164 lb 3.9 oz (74.5 kg)  10/04/17 160 lb 9.6 oz (72.8 kg)      Studies/Labs Reviewed:   EKG:  EKG is*** ordered today.  The ekg ordered today demonstrates ***  Recent Labs: 08/15/2017: TSH 3.48 01/24/2018: Magnesium 1.8 01/28/2018: ALT 37; B Natriuretic Peptide 161.0 01/31/2018: BUN 28; Creatinine, Ser 0.68; Hemoglobin 8.7;  Platelets 356; Potassium 4.4; Sodium 140   Lipid Panel    Component Value Date/Time   CHOL 194 09/21/2016 1031   TRIG 90.0 09/21/2016 1031   HDL 52.20 09/21/2016 1031   CHOLHDL 4 09/21/2016 1031   VLDL 18.0 09/21/2016 1031   LDLCALC 124 (H) 09/21/2016 1031   LDLDIRECT 172.6 04/01/2008 1205    Additional studies/ records that were reviewed today include:  2D echo 2/13/2019Study Conclusions   - Left ventricle: The cavity size was normal. Wall thickness was   increased in a pattern of mild LVH. Systolic function was normal.   The estimated ejection fraction was in the range of 55% to 60%.   Wall motion was normal; there were no  regional wall motion   abnormalities. Features are consistent with a pseudonormal left   ventricular filling pattern, with concomitant abnormal relaxation   and increased filling pressure (grade 2 diastolic dysfunction).   Doppler parameters are consistent with high ventricular filling   pressure. - Mitral valve: There was mild regurgitation. - Pericardium, extracardiac: A trivial pericardial effusion was   identified.       ASSESSMENT:    1. CAD S/P percutaneous coronary angioplasty   2. Factor V Leiden, prothrombin gene mutation ([place])   3. Hyperlipidemia, unspecified hyperlipidemia type   4. TOBACCO ABUSE      PLAN:  In order of problems listed above:  CAD status post STEMI in 2002 treated with DES to the RCA, in-stent restenosis 2003 treated with cutting balloon and brachytherapy, acute in-stent thrombosis 06/2010 treated with BMS and thrombectomy, Plavix nonresponder and placed on Brilinta.  Last cath in 2013 reassuring with minor CAD.  Factor V Leiden deficiency with history of PE on lifetime Coumadin  Hyperlipidemia  Tobacco abuse with COPD exacerbation last month  Medication Adjustments/Labs and Tests Ordered: Current medicines are reviewed at length with the patient today.  Concerns regarding medicines are outlined above.  Medication  changes, Labs and Tests ordered today are listed in the Patient Instructions below. There are no Patient Instructions on file for this visit.   Signed, Ermalinda Barrios, PA-C  02/13/2018 2:26 PM    [facility] [address], [place], [place]  [address] Phone: [phone]; Fax: [phone]

## 2018-02-14 NOTE — Telephone Encounter (Signed)
Lm for pt

## 2018-02-15 ENCOUNTER — Other Ambulatory Visit: Payer: Self-pay

## 2018-02-15 ENCOUNTER — Emergency Department (HOSPITAL_COMMUNITY): Payer: Medicare Other

## 2018-02-15 ENCOUNTER — Inpatient Hospital Stay (HOSPITAL_COMMUNITY)
Admission: EM | Admit: 2018-02-15 | Discharge: 2018-02-21 | DRG: 190 | Disposition: A | Discharge status: Home Health | Payer: Medicare Other | Attending: Family Medicine | Admitting: Family Medicine

## 2018-02-15 ENCOUNTER — Encounter (HOSPITAL_COMMUNITY): Payer: Self-pay | Admitting: Emergency Medicine

## 2018-02-15 ENCOUNTER — Telehealth: Payer: Self-pay | Admitting: Adult Health

## 2018-02-15 DIAGNOSIS — Z8249 Family history of ischemic heart disease and other diseases of the circulatory system: Secondary | ICD-10-CM

## 2018-02-15 DIAGNOSIS — I2510 Atherosclerotic heart disease of native coronary artery without angina pectoris: Secondary | ICD-10-CM

## 2018-02-15 DIAGNOSIS — X58XXXA Exposure to other specified factors, initial encounter: Secondary | ICD-10-CM | POA: Diagnosis present

## 2018-02-15 DIAGNOSIS — M8008XA Age-related osteoporosis with current pathological fracture, vertebra(e), initial encounter for fracture: Secondary | ICD-10-CM | POA: Diagnosis not present

## 2018-02-15 DIAGNOSIS — Z79899 Other long term (current) drug therapy: Secondary | ICD-10-CM

## 2018-02-15 DIAGNOSIS — I252 Old myocardial infarction: Secondary | ICD-10-CM

## 2018-02-15 DIAGNOSIS — S32039A Unspecified fracture of third lumbar vertebra, initial encounter for closed fracture: Secondary | ICD-10-CM | POA: Diagnosis not present

## 2018-02-15 DIAGNOSIS — D649 Anemia, unspecified: Secondary | ICD-10-CM | POA: Diagnosis present

## 2018-02-15 DIAGNOSIS — R5381 Other malaise: Secondary | ICD-10-CM | POA: Diagnosis not present

## 2018-02-15 DIAGNOSIS — R0602 Shortness of breath: Secondary | ICD-10-CM | POA: Diagnosis not present

## 2018-02-15 DIAGNOSIS — Z9861 Coronary angioplasty status: Secondary | ICD-10-CM

## 2018-02-15 DIAGNOSIS — E039 Hypothyroidism, unspecified: Secondary | ICD-10-CM | POA: Diagnosis present

## 2018-02-15 DIAGNOSIS — Z7901 Long term (current) use of anticoagulants: Secondary | ICD-10-CM

## 2018-02-15 DIAGNOSIS — R600 Localized edema: Secondary | ICD-10-CM

## 2018-02-15 DIAGNOSIS — Z9981 Dependence on supplemental oxygen: Secondary | ICD-10-CM

## 2018-02-15 DIAGNOSIS — D6852 Prothrombin gene mutation: Secondary | ICD-10-CM | POA: Diagnosis present

## 2018-02-15 DIAGNOSIS — R069 Unspecified abnormalities of breathing: Secondary | ICD-10-CM | POA: Diagnosis not present

## 2018-02-15 DIAGNOSIS — D6859 Other primary thrombophilia: Secondary | ICD-10-CM | POA: Diagnosis present

## 2018-02-15 DIAGNOSIS — R062 Wheezing: Secondary | ICD-10-CM | POA: Diagnosis not present

## 2018-02-15 DIAGNOSIS — J9610 Chronic respiratory failure, unspecified whether with hypoxia or hypercapnia: Secondary | ICD-10-CM

## 2018-02-15 DIAGNOSIS — Z86718 Personal history of other venous thrombosis and embolism: Secondary | ICD-10-CM

## 2018-02-15 DIAGNOSIS — Z79890 Hormone replacement therapy (postmenopausal): Secondary | ICD-10-CM

## 2018-02-15 DIAGNOSIS — J441 Chronic obstructive pulmonary disease with (acute) exacerbation: Principal | ICD-10-CM | POA: Diagnosis present

## 2018-02-15 DIAGNOSIS — E785 Hyperlipidemia, unspecified: Secondary | ICD-10-CM | POA: Diagnosis present

## 2018-02-15 DIAGNOSIS — J9621 Acute and chronic respiratory failure with hypoxia: Secondary | ICD-10-CM | POA: Diagnosis not present

## 2018-02-15 DIAGNOSIS — F17210 Nicotine dependence, cigarettes, uncomplicated: Secondary | ICD-10-CM | POA: Diagnosis present

## 2018-02-15 DIAGNOSIS — Z7951 Long term (current) use of inhaled steroids: Secondary | ICD-10-CM

## 2018-02-15 DIAGNOSIS — Z881 Allergy status to other antibiotic agents status: Secondary | ICD-10-CM

## 2018-02-15 DIAGNOSIS — Z86711 Personal history of pulmonary embolism: Secondary | ICD-10-CM

## 2018-02-15 DIAGNOSIS — G8929 Other chronic pain: Secondary | ICD-10-CM | POA: Diagnosis present

## 2018-02-15 DIAGNOSIS — Z888 Allergy status to other drugs, medicaments and biological substances status: Secondary | ICD-10-CM

## 2018-02-15 DIAGNOSIS — D6851 Activated protein C resistance: Secondary | ICD-10-CM | POA: Diagnosis present

## 2018-02-15 DIAGNOSIS — Z825 Family history of asthma and other chronic lower respiratory diseases: Secondary | ICD-10-CM

## 2018-02-15 DIAGNOSIS — Z885 Allergy status to narcotic agent status: Secondary | ICD-10-CM

## 2018-02-15 DIAGNOSIS — J9690 Respiratory failure, unspecified, unspecified whether with hypoxia or hypercapnia: Secondary | ICD-10-CM | POA: Diagnosis present

## 2018-02-15 LAB — CBC
HCT: 28.4 % — ABNORMAL LOW (ref 36.0–46.0)
Hemoglobin: 8.1 g/dL — ABNORMAL LOW (ref 12.0–15.0)
MCH: 27.9 pg (ref 26.0–34.0)
MCHC: 28.5 g/dL — ABNORMAL LOW (ref 30.0–36.0)
MCV: 97.9 fL (ref 80.0–100.0)
Platelets: 299 10*3/uL (ref 150–400)
RBC: 2.9 MIL/uL — AB (ref 3.87–5.11)
RDW: 16.2 % — ABNORMAL HIGH (ref 11.5–15.5)
WBC: 5.5 10*3/uL (ref 4.0–10.5)
nRBC: 0 % (ref 0.0–0.2)

## 2018-02-15 LAB — BASIC METABOLIC PANEL
Anion gap: 7 (ref 5–15)
BUN: 18 mg/dL (ref 8–23)
CALCIUM: 8 mg/dL — AB (ref 8.9–10.3)
CO2: 32 mmol/L (ref 22–32)
CREATININE: 0.75 mg/dL (ref 0.44–1.00)
Chloride: 99 mmol/L (ref 98–111)
GFR calc non Af Amer: >60 mL/min (ref >60)
Glucose, Bld: 96 mg/dL (ref 70–99)
Potassium: 3.7 mmol/L (ref 3.5–5.1)
Sodium: 138 mmol/L (ref 135–145)

## 2018-02-15 LAB — TROPONIN I: Troponin I: <0.03 ng/mL (ref <0.03)

## 2018-02-15 LAB — PROTIME-INR
INR: 2.05
Prothrombin Time: 22.9 seconds — ABNORMAL HIGH (ref 11.4–15.2)

## 2018-02-15 MED ORDER — ONDANSETRON HCL 4 MG/2ML IJ SOLN: 4.0000 mg | Freq: Four times a day (QID) | INTRAMUSCULAR | Status: DC | PRN

## 2018-02-15 MED ORDER — WARFARIN - PHARMACIST DOSING INPATIENT
Freq: Every day | Status: DC
  Administered 2018-02-19 – 2018-02-20 (×2)

## 2018-02-15 MED ORDER — IPRATROPIUM-ALBUTEROL 0.5-2.5 (3) MG/3ML IN SOLN
3.0000 mL | Freq: Four times a day (QID) | RESPIRATORY_TRACT | Status: DC
  Administered 2018-02-15 – 2018-02-21 (×22): 3 mL via RESPIRATORY_TRACT
  Filled 2018-02-15 (×22): qty 3

## 2018-02-15 MED ORDER — HYDROCODONE-ACETAMINOPHEN 5-325 MG PO TABS
1.0000 | ORAL_TABLET | ORAL | Status: DC | PRN
  Administered 2018-02-16 – 2018-02-20 (×2): 1 via ORAL
  Filled 2018-02-15 (×2): qty 1

## 2018-02-15 MED ORDER — GUAIFENESIN ER 600 MG PO TB12
ORAL_TABLET | ORAL | Status: AC
  Filled 2018-02-15: qty 2

## 2018-02-15 MED ORDER — ARFORMOTEROL TARTRATE 15 MCG/2ML IN NEBU
15.0000 ug | INHALATION_SOLUTION | Freq: Two times a day (BID) | RESPIRATORY_TRACT | Status: DC
  Administered 2018-02-15 – 2018-02-21 (×12): 15 ug via RESPIRATORY_TRACT
  Filled 2018-02-15 (×12): qty 2

## 2018-02-15 MED ORDER — BUPROPION HCL ER (SR) 100 MG PO TB12
100.0000 mg | ORAL_TABLET | Freq: Every morning | ORAL | Status: DC
  Administered 2018-02-16 – 2018-02-21 (×6): 100 mg via ORAL
  Filled 2018-02-15 (×9): qty 1

## 2018-02-15 MED ORDER — GUAIFENESIN ER 600 MG PO TB12
1200.0000 mg | ORAL_TABLET | Freq: Two times a day (BID) | ORAL | Status: DC
  Administered 2018-02-16 – 2018-02-21 (×12): 1200 mg via ORAL
  Filled 2018-02-15 (×17): qty 2

## 2018-02-15 MED ORDER — LEVOTHYROXINE SODIUM 25 MCG PO TABS
125.0000 ug | ORAL_TABLET | Freq: Every day | ORAL | Status: DC
  Administered 2018-02-16 – 2018-02-21 (×6): 125 ug via ORAL
  Filled 2018-02-15 (×6): qty 1

## 2018-02-15 MED ORDER — SENNOSIDES-DOCUSATE SODIUM 8.6-50 MG PO TABS
2.0000 | ORAL_TABLET | Freq: Two times a day (BID) | ORAL | Status: DC
  Administered 2018-02-16 – 2018-02-21 (×11): 2 via ORAL
  Filled 2018-02-15 (×17): qty 2

## 2018-02-15 MED ORDER — IPRATROPIUM-ALBUTEROL 0.5-2.5 (3) MG/3ML IN SOLN
3.0000 mL | Freq: Once | RESPIRATORY_TRACT | Status: AC
  Administered 2018-02-15: 3 mL via RESPIRATORY_TRACT
  Filled 2018-02-15: qty 3

## 2018-02-15 MED ORDER — FERROUS SULFATE 325 (65 FE) MG PO TABS
325.0000 mg | ORAL_TABLET | Freq: Every day | ORAL | Status: DC
  Administered 2018-02-16 – 2018-02-21 (×6): 325 mg via ORAL
  Filled 2018-02-15 (×9): qty 1

## 2018-02-15 MED ORDER — ACETAMINOPHEN 325 MG PO TABS: 650.0000 mg | ORAL_TABLET | Freq: Four times a day (QID) | ORAL | Status: DC | PRN

## 2018-02-15 MED ORDER — ALBUTEROL SULFATE (2.5 MG/3ML) 0.083% IN NEBU
2.5000 mg | INHALATION_SOLUTION | Freq: Once | RESPIRATORY_TRACT | Status: AC
  Administered 2018-02-15: 2.5 mg via RESPIRATORY_TRACT
  Filled 2018-02-15: qty 3

## 2018-02-15 MED ORDER — BACLOFEN 10 MG PO TABS
10.0000 mg | ORAL_TABLET | Freq: Three times a day (TID) | ORAL | Status: DC
  Administered 2018-02-16 – 2018-02-21 (×18): 10 mg via ORAL
  Filled 2018-02-15 (×18): qty 1

## 2018-02-15 MED ORDER — MAGNESIUM SULFATE 2 GM/50ML IV SOLN
2.0000 g | Freq: Once | INTRAVENOUS | Status: AC
  Administered 2018-02-15: 2 g via INTRAVENOUS
  Filled 2018-02-15: qty 50

## 2018-02-15 MED ORDER — BENZONATATE 100 MG PO CAPS
100.0000 mg | ORAL_CAPSULE | Freq: Two times a day (BID) | ORAL | Status: DC | PRN
  Administered 2018-02-16: 100 mg via ORAL
  Filled 2018-02-15: qty 1

## 2018-02-15 MED ORDER — TICAGRELOR 60 MG PO TABS
60.0000 mg | ORAL_TABLET | Freq: Two times a day (BID) | ORAL | Status: DC
  Administered 2018-02-16 – 2018-02-21 (×11): 60 mg via ORAL
  Filled 2018-02-15 (×14): qty 1

## 2018-02-15 MED ORDER — ALBUTEROL SULFATE (2.5 MG/3ML) 0.083% IN NEBU
2.5000 mg | INHALATION_SOLUTION | RESPIRATORY_TRACT | Status: DC | PRN
  Administered 2018-02-17: 2.5 mg via RESPIRATORY_TRACT
  Filled 2018-02-15: qty 3

## 2018-02-15 MED ORDER — ACETAMINOPHEN 650 MG RE SUPP: 650.0000 mg | Freq: Four times a day (QID) | RECTAL | Status: DC | PRN

## 2018-02-15 MED ORDER — LIDOCAINE 5 % EX PTCH
1.0000 | MEDICATED_PATCH | CUTANEOUS | Status: DC
  Administered 2018-02-16 – 2018-02-20 (×6): 1 via TRANSDERMAL
  Filled 2018-02-15 (×9): qty 1

## 2018-02-15 MED ORDER — ONDANSETRON HCL 4 MG PO TABS: 4.0000 mg | ORAL_TABLET | Freq: Four times a day (QID) | ORAL | Status: DC | PRN

## 2018-02-15 MED ORDER — FENTANYL 50 MCG/HR TD PT72
1.0000 | MEDICATED_PATCH | TRANSDERMAL | Status: DC
  Administered 2018-02-16 – 2018-02-18 (×2): 1 via TRANSDERMAL
  Filled 2018-02-15 (×2): qty 1

## 2018-02-15 MED ORDER — ALBUTEROL SULFATE (2.5 MG/3ML) 0.083% IN NEBU
5.0000 mg | INHALATION_SOLUTION | Freq: Once | RESPIRATORY_TRACT | Status: AC
  Administered 2018-02-15: 5 mg via RESPIRATORY_TRACT
  Filled 2018-02-15: qty 6

## 2018-02-15 MED ORDER — TRAZODONE HCL 50 MG PO TABS
50.0000 mg | ORAL_TABLET | Freq: Every evening | ORAL | Status: DC | PRN
  Administered 2018-02-16 – 2018-02-20 (×6): 50 mg via ORAL
  Filled 2018-02-15 (×6): qty 1

## 2018-02-15 MED ORDER — WARFARIN SODIUM 5 MG PO TABS
5.0000 mg | ORAL_TABLET | Freq: Once | ORAL | Status: AC
  Administered 2018-02-16: 5 mg via ORAL
  Filled 2018-02-15: qty 1

## 2018-02-15 MED ORDER — METHYLPREDNISOLONE SODIUM SUCC 125 MG IJ SOLR
60.0000 mg | Freq: Four times a day (QID) | INTRAMUSCULAR | Status: DC
  Administered 2018-02-16 – 2018-02-21 (×21): 60 mg via INTRAVENOUS
  Filled 2018-02-15 (×21): qty 2

## 2018-02-15 NOTE — H&P (Signed)
History and Physical    [name] RWE:[id number] DOB: 1950-02-22 DOA: 02/15/2018  PCP: [name], NP  Patient coming from: Home  I have personally briefly reviewed patient's old medical records in [place]  Chief Complaint: Dyspnea, fatigue  HPI: [name] is a 68 y.o. female with medical history significant for chronic respiratory failure on 4 L O2 via [place] at home, COPD, CAD, hypothyroidism, and hypercoagulable state/factor V Leiden deficiency on chronic Coumadin who presents to the ED with complaint of progressive shortness of breath, fatigue, nonproductive cough, and difficulty caring for oneself.  She was recently admitted from 1/20-1/24/2020 for exacerbation of her COPD and discharged to [facility] rehab facility.  She says she was doing fairly well there while working with physical therapy.  She was discharged to home 3 days ago.  She says her home nebulizer machine was unplugged about 2 days ago from her chart and stripped and she was unable to plug herself.  She called EMS who did plug-in for her.  Her home PT came this morning and noticed she was doing unwell and therefore called EMS.  Per ED documentation, her O2 sat was 88% on room air on EMS arrival.  She denies any chest pain, fevers, diaphoresis, abdominal pain, diarrhea, dysuria, or any obvious bleeding.  ED Course:  Initial vitals showed BP 104/54, pulse 89, RR 16, temp 98.7 Fahrenheit, SPO2 100% on 2 L O2 via [place].  Labs are notable for WBC 5.5, hemoglobin 8.1, platelets 299, troponin I <0.03, INR 2.05.  Chest x-ray showed chronic changes of interstitial lung disease similar to prior without focal consolidation.  Patient was given albuterol and DuoNeb treatment in the ED and IV magnesium.  The hospitalist service was consulted to admit for further evaluation and management.  Review of Systems: As per HPI otherwise 10 point review of systems negative.    Past Medical History:  Diagnosis Date  . Acute  MI ([place])    x4, code blue x3  . Arteriosclerotic cardiovascular disease (ASCVD) 2002   Inf STEMI-2002. 2003-cutting balloon + brachytherapy for restenosis; subsequent acute stent thrombosis 06/2010 requiring 2 separate interventions (Zeta stent, then repeat cath with thrombectomy). focal basal inf AK, nl EF; 03/2011: Patent stents, minor nonobst  residual dz, nl EF; neg stress nuclear in 2008 and stress echo in 2009  . Chronic anticoagulation    Warfarin plus ticagrelor  . Chronic respiratory failure ([place]) 08/27/2013   On 2L 02  . COPD (chronic obstructive pulmonary disease) ([place])    02 dependent  . COPD (chronic obstructive pulmonary disease) ([place])   . Factor 5 Leiden mutation, heterozygous ([place])   . Factor V Leiden, prothrombin gene mutation ([place]) 2006  . Hyperlipidemia   . Hypothyroidism   . Noncompliance   . Pelvic fracture ([place]) 2009  . Pulmonary embolism ([place]) 2006   Associated with deep vein thrombosis-2006; + factor V Leiden  . Tobacco abuse    50 pack years    Past Surgical History:  Procedure Laterality Date  . COLONOSCOPY  Approximately 2000   Negative screening study  . CORONARY ANGIOPLASTY  2002, 2003, 2012  . LEFT AND RIGHT HEART CATHETERIZATION WITH CORONARY ANGIOGRAM N/A 04/03/2011   Procedure: LEFT AND RIGHT HEART CATHETERIZATION WITH CORONARY ANGIOGRAM;  Surgeon: Sherren Mocha, MD;  Location: [facility] CATH LAB;  Service: Cardiovascular;  Laterality: N/A;     reports that she has been smoking cigarettes. She started smoking about 49 years ago. She has a 25.00 pack-year  smoking history. She has never used smokeless tobacco. She reports that she does not drink alcohol or use drugs.  Allergies  Allergen Reactions  . Dilaudid [Hydromorphone Hcl] Hives and Nausea Only  . Minocycline Hcl     REACTION: Dizzy  . Prednisone     REACTION: feels like throat swelling, hallucinations  . Varenicline Tartrate     REACTION: Dizzy(chantix)   . Zocor [Simvastatin - High Dose] Other  (See Comments)    myalgia    Family History  Problem Relation Age of Onset  . Emphysema Mother   . Alzheimer's disease Mother   . Emphysema Sister   . Heart disease Father   . Factor V Leiden deficiency Father   . Factor V Leiden deficiency Sister   . Factor V Leiden deficiency Daughter   . Kidney cancer Paternal Grandmother      Prior to Admission medications   Medication Sig Start Date End Date Taking? Authorizing Provider  albuterol (PROVENTIL) (2.5 MG/3ML) 0.083% nebulizer solution USE 1 VIAL BY NEBULIZER EVERY 4 HOURS AS NEEDED FOR WHEEZING. DX: J44.9 Patient taking differently: Take 2.5 mg by nebulization every 4 (four) hours as needed for wheezing or shortness of breath.  01/24/17   Magdalen Spatz, NP  arformoterol (BROVANA) 15 MCG/2ML NEBU Take 2 mLs (15 mcg total) by nebulization 2 (two) times daily. 09/07/17   Juanito Doom, MD  baclofen (LIORESAL) 10 MG tablet Take 1 tablet (10 mg total) by mouth 3 (three) times daily. 01/24/18   Lavina Hamman, MD  benzonatate (TESSALON) 100 MG capsule Take 1 capsule (100 mg total) by mouth 2 (two) times daily as needed for cough. 08/30/17   Nafziger, Tommi Rumps, NP  budesonide (PULMICORT) 0.5 MG/2ML nebulizer solution Take 2 mLs (0.5 mg total) by nebulization 2 (two) times daily. Dx: J43.9 01/24/17   Magdalen Spatz, NP  fentaNYL (DURAGESIC) 50 MCG/HR Place 1 patch onto the skin every 3 (three) days. 02/01/18   Johnson, Clanford L, MD  ferrous sulfate 325 (65 FE) MG tablet Take 325 mg by mouth daily with breakfast.    [provider]  ipratropium-albuterol (DUONEB) 0.5-2.5 (3) MG/3ML SOLN Take 3 mLs by nebulization 4 (four) times daily. 03/20/17   Magdalen Spatz, NP  levothyroxine (SYNTHROID, LEVOTHROID) 125 MCG tablet Take 1 tablet (125 mcg total) by mouth daily. 08/16/17   Nafziger, Tommi Rumps, NP  lidocaine (LIDODERM) 5 % Place 1 patch onto the skin daily. Remove & Discard patch within 12 hours or as directed by MD 01/24/18   Lavina Hamman, MD    nicotine (NICODERM CQ - DOSED IN MG/24 HOURS) 14 mg/24hr patch Place 1 patch (14 mg total) onto the skin daily. 01/25/18   Lavina Hamman, MD  nitroGLYCERIN (NITROSTAT) 0.4 MG SL tablet Place 1 tablet (0.4 mg total) under the tongue every 5 (five) minutes as needed for chest pain. 10/28/14   Herminio Commons, MD  Omega-3 Fatty Acids (FISH OIL) 600 MG CAPS Take 1 capsule by mouth 3 (three) times daily.    [provider]  OXYGEN Inhale 3 L into the lungs continuous.     [provider]  polyethylene glycol (MIRALAX / GLYCOLAX) packet Take 17 g by mouth daily. 01/25/18   Lavina Hamman, MD  Respiratory Therapy Supplies (FLUTTER) DEVI Use as directed 05/14/15   Javier Glazier, MD  senna-docusate (SENOKOT-S) 8.6-50 MG tablet Take 2 tablets by mouth 2 (two) times daily. 01/24/18   Berle Mull  M, MD  ticagrelor (BRILINTA) 60 MG TABS tablet Take 1 tablet (60 mg total) by mouth 2 (two) times daily. 02/01/18   Johnson, Clanford L, MD  traZODone (DESYREL) 50 MG tablet Take 50 mg by mouth at bedtime as needed for sleep.    [provider]  warfarin (COUMADIN) 1 MG tablet Take 0.5 mg by mouth See admin instructions. Give 0.5 mg by mouth along with 5 mg to = 5.5 mg once a day     [provider]  warfarin (COUMADIN) 5 MG tablet Take 5 mg by mouth See admin instructions. Give 5 mg by mouth along with 0.5 mg to = 5.5 mg once a day     [provider]    Physical Exam: Vitals:   02/15/18 1700 02/15/18 1701 02/15/18 1830 02/15/18 1930  BP: (!) 123/57  (!) 115/50 (!) 92/46  Pulse: 91   92  Resp:    (!) 21  Temp:      TempSrc:      SpO2: 92% 95%  100%  Weight:      Height:        Constitutional: Chronically ill-appearing woman sitting up in bed, NAD, calm, comfortable Eyes: PERRL, lids and conjunctivae normal ENMT: Mucous membranes are moist. Posterior pharynx clear of any exudate or lesions. Neck: normal, supple, no masses. Respiratory: Distant breath  sounds without obvious wheezing or crackles.  Normal respiratory effort. No accessory muscle use.  Cardiovascular: Regular rate and rhythm, no murmurs / rubs / gallops.  Nonpitting edema bilateral lower extremities. Abdomen: no tenderness, no masses palpated. No hepatosplenomegaly. Bowel sounds positive.  Musculoskeletal: no clubbing / cyanosis. No joint deformity upper and lower extremities. No contractures. Skin: no rashes, lesions, ulcers. No induration Neurologic: CN 2-12 grossly intact. Sensation and strength intact. Psychiatric: Normal judgment and insight. Alert and oriented x 3. Normal mood.     Labs on Admission: I have personally reviewed following labs and imaging studies  CBC: Recent Labs  Lab 02/15/18 1618  WBC 5.5  HGB 8.1*  HCT 28.4*  MCV 97.9  PLT 175   Basic Metabolic Panel: Recent Labs  Lab 02/15/18 1618  NA 138  K 3.7  CL 99  CO2 32  GLUCOSE 96  BUN 18  CREATININE 0.75  CALCIUM 8.0*   GFR: Estimated Creatinine Clearance: 68.2 mL/min (by C-G formula based on SCr of 0.75 mg/dL). Liver Function Tests: No results for input(s): AST, ALT, ALKPHOS, BILITOT, PROT, ALBUMIN in the last 168 hours. No results for input(s): LIPASE, AMYLASE in the last 168 hours. No results for input(s): AMMONIA in the last 168 hours. Coagulation Profile: Recent Labs  Lab 02/15/18 1701  INR 2.05   Cardiac Enzymes: Recent Labs  Lab 02/15/18 1701  TROPONINI <0.03   BNP (last 3 results) No results for input(s): PROBNP in the last 8760 hours. HbA1C: No results for input(s): HGBA1C in the last 72 hours. CBG: No results for input(s): GLUCAP in the last 168 hours. Lipid Profile: No results for input(s): CHOL, HDL, LDLCALC, TRIG, CHOLHDL, LDLDIRECT in the last 72 hours. Thyroid Function Tests: No results for input(s): TSH, T4TOTAL, FREET4, T3FREE, THYROIDAB in the last 72 hours. Anemia Panel: No results for input(s): VITAMINB12, FOLATE, FERRITIN, TIBC, IRON, RETICCTPCT in  the last 72 hours. Urine analysis:    Component Value Date/Time   COLORURINE YELLOW 01/19/2018 2130   APPEARANCEUR HAZY (A) 01/19/2018 2130   LABSPEC 1.019 01/19/2018 2130   PHURINE 6.0 01/19/2018 2130  GLUCOSEU NEGATIVE 01/19/2018 2130   HGBUR LARGE (A) 01/19/2018 2130   BILIRUBINUR NEGATIVE 01/19/2018 2130   BILIRUBINUR n 09/21/2016 1432   KETONESUR NEGATIVE 01/19/2018 2130   PROTEINUR 100 (A) 01/19/2018 2130   UROBILINOGEN 0.2 09/21/2016 1432   UROBILINOGEN 0.2 04/13/2009 2025   NITRITE NEGATIVE 01/19/2018 2130   LEUKOCYTESUR NEGATIVE 01/19/2018 2130    Radiological Exams on Admission: Dg Chest 2 View  Result Date: 02/15/2018 CLINICAL DATA:  Shortness of breath. EXAM: CHEST - 2 VIEW COMPARISON:  02/02/2018.  01/28/2018.  08/27/2013.  CT 11/14/2017. FINDINGS: Mediastinum hilar structures normal. Stable cardiomegaly with normal pulmonary vascularity. Chronic interstitial changes consistent chronic interstitial lung disease and bilateral pleural-parenchymal thickening consistent scarring again noted. Similar findings noted on prior exams. Superimposed active interstitial lung disease can not be completely excluded. No pneumothorax. IMPRESSION: 1. Chronic interstitial lung disease again noted. Similar findings noted on multiple prior exams. Superimposed acute active interstitial disease can not be excluded. Bilateral pleural-parenchymal scarring. 2.  Stable cardiomegaly. Electronically Signed   By: Marcello Moores  Register   On: 02/15/2018 17:20    EKG: Independently reviewed. Sinus rhythm, RR pattern inferior leads, motion artifact, no significant change from prior  Assessment/Plan Principal Problem:   COPD exacerbation (HCC) Active Problems:   Hypothyroidism   CAD S/P percutaneous coronary angioplasty   Factor V Leiden, prothrombin gene mutation ([place])   Chronic anticoagulation   Chronic respiratory failure (HCC)  [name] is a 68 y.o. female with medical history significant for  chronic respiratory failure on 4 L O2 via [place] at home, COPD, CAD, hypothyroidism, and hypercoagulable state/factor V Leiden deficiency on chronic Coumadin who presents to the ED with acute on chronic respiratory failure with hypoxia and COPD exacerbation.  Acute on chronic respiratory failure with hypoxia/COPD exacerbation: -Continue supplemental O2 to maintain sats between 88-92% -Scheduled duo nebs and as needed albuterol -IV Solu-Medrol 60 mg every 6 hours  CAD s/p PCI: Denies active chest pain. -Continue Brilinta  Chronic anticoagulation due to hypercoagulable state/factor V Leiden deficiency: -Continue Coumadin per pharmacy  Anemia: Hemoglobin 8.1 on admission.  Between 8.5-10.1 prior admission.  Obvious bleeding. -Monitor for signs/symptoms of bleeding while on Coumadin and Brilinta  Hypothyroidism: Continue Synthroid.  Traumatic and osteoporotic L3 fracture: Noted 1 month ago, no surgical intervention per neurosurgery.  Using TLSO with PT at home. -Continue TLSO -PT/OT eval  Chronic pain: --Continue pain regimen with fentanyl patch, Lidoderm, Norco PRN, baclofen  DVT prophylaxis: Coumadin Code Status: Full code, confirmed with patient Family Communication: None present at bedside on admission Disposition Plan: Pending further management of COPD and PT/OT eval Consults called: None Admission status: Observation   Zada Finders MD [facility] Pager [phone]  If 7PM-7AM, please contact night-coverage [email or web address]  02/15/2018, 7:46 PM

## 2018-02-15 NOTE — Telephone Encounter (Signed)
Copied from [place] [phone]. Topic: Quick Communication - See Telephone Encounter >> Feb 15, 2018 12:51 PM Rayann Heman wrote: CRM for notification. See Telephone encounter for: 02/15/18. Amy calling from [facility] called and stated that when she went to see patient she had not been able to get out of bed for 24 hours. Pt had no other person in home to help her. Pt was having difficulty breathing. With patients permission she called ems and ems is now taking her to the hospital. Juluis Rainier

## 2018-02-15 NOTE — Progress Notes (Signed)
ANTICOAGULATION CONSULT NOTE - Initial Consult  Pharmacy Consult for Coumadin Indication: Factor V deficiency(hypercoaguable state)  Allergies  Allergen Reactions  . Dilaudid [Hydromorphone Hcl] Hives and Nausea Only  . Minocycline Hcl     REACTION: Dizzy  . Prednisone     REACTION: feels like throat swelling, hallucinations  . Varenicline Tartrate     REACTION: Dizzy(chantix)   . Zocor [Simvastatin - High Dose] Other (See Comments)    myalgia    Patient Measurements: Height: 5\' 4"  (162.6 cm) Weight: 168 lb (76.2 kg) IBW/kg (Calculated) : 54.7  Vital Signs: Temp: 98.1 F (36.7 C) (02/07 1527) Temp Source: Oral (02/07 1527) BP: 92/46 (02/07 1930) Pulse Rate: 92 (02/07 1930)  Labs: Recent Labs    02/15/18 1618 02/15/18 1701  HGB 8.1*  --   HCT 28.4*  --   PLT 299  --   LABPROT  --  22.9*  INR  --  2.05  CREATININE 0.75  --   TROPONINI  --  <0.03    Estimated Creatinine Clearance: 68.2 mL/min (by C-G formula based on SCr of 0.75 mg/dL).   Medical History: Past Medical History:  Diagnosis Date  . Acute MI ([place])    x4, code blue x3  . Arteriosclerotic cardiovascular disease (ASCVD) 2002   Inf STEMI-2002. 2003-cutting balloon + brachytherapy for restenosis; subsequent acute stent thrombosis 06/2010 requiring 2 separate interventions (Zeta stent, then repeat cath with thrombectomy). focal basal inf AK, nl EF; 03/2011: Patent stents, minor nonobst  residual dz, nl EF; neg stress nuclear in 2008 and stress echo in 2009  . Chronic anticoagulation    Warfarin plus ticagrelor  . Chronic respiratory failure ([place]) 08/27/2013   On 2L 02  . 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 COPD (chronic obstructive pulmonary disease) ([place])   . Factor 5 Leiden mutation, heterozygous ([place])   . Factor V Leiden, prothrombin gene mutation ([place]) 2006  . Hyperlipidemia   . Hypothyroidism   . Noncompliance   . Pelvic fracture ([place]) 2009  . Pulmonary  embolism ([place]) 2006   Associated with deep vein thrombosis-2006; + factor V Leiden  . Tobacco abuse    50 pack years    Medications:  See med rec  Assessment: Patient presented to ED with dyspnea and fatigue. Patient is on chronic coumadin therapy for Factor V Leiden deficiency. Pharmacy asked to dose Coumadin. Home dose of warfarin: 5 mg Mon, Wed, Friday and 6mg  all other days  Goal of Therapy:  INR 2-3 Monitor platelets by anticoagulation protocol: Yes   Plan:  Coumadin 5mg  po x 1 PT-INR daily Monitor for S/S of bleeding  Isac Sarna, BS Vena [place], BCPS Clinical Pharmacist Pager [phone] 02/15/2018,8:20 PM

## 2018-02-15 NOTE — ED Triage Notes (Signed)
Patient c/o SOB that she states started "a while ago. Patient 88% on RA when EMS arrived. O2 increased to 5 L, patient at 100% now.

## 2018-02-15 NOTE — Telephone Encounter (Signed)
ATC pt- unable to leave vm, as mailbox is full.  Will call back

## 2018-02-15 NOTE — Telephone Encounter (Signed)
Noted  

## 2018-02-16 ENCOUNTER — Encounter (HOSPITAL_COMMUNITY): Payer: Self-pay | Admitting: *Deleted

## 2018-02-16 ENCOUNTER — Other Ambulatory Visit: Payer: Self-pay

## 2018-02-16 DIAGNOSIS — I2510 Atherosclerotic heart disease of native coronary artery without angina pectoris: Secondary | ICD-10-CM | POA: Diagnosis not present

## 2018-02-16 DIAGNOSIS — J9611 Chronic respiratory failure with hypoxia: Secondary | ICD-10-CM | POA: Diagnosis not present

## 2018-02-16 DIAGNOSIS — E039 Hypothyroidism, unspecified: Secondary | ICD-10-CM

## 2018-02-16 DIAGNOSIS — D6851 Activated protein C resistance: Secondary | ICD-10-CM | POA: Diagnosis not present

## 2018-02-16 DIAGNOSIS — Z7901 Long term (current) use of anticoagulants: Secondary | ICD-10-CM | POA: Diagnosis not present

## 2018-02-16 DIAGNOSIS — J441 Chronic obstructive pulmonary disease with (acute) exacerbation: Secondary | ICD-10-CM | POA: Diagnosis not present

## 2018-02-16 DIAGNOSIS — Z9861 Coronary angioplasty status: Secondary | ICD-10-CM | POA: Diagnosis not present

## 2018-02-16 LAB — BASIC METABOLIC PANEL
ANION GAP: 8 (ref 5–15)
BUN: 18 mg/dL (ref 8–23)
CO2: 33 mmol/L — ABNORMAL HIGH (ref 22–32)
Calcium: 8.1 mg/dL — ABNORMAL LOW (ref 8.9–10.3)
Chloride: 97 mmol/L — ABNORMAL LOW (ref 98–111)
Creatinine, Ser: 0.9 mg/dL (ref 0.44–1.00)
GFR calc Af Amer: >60 mL/min (ref >60)
GFR calc non Af Amer: >60 mL/min (ref >60)
Glucose, Bld: 179 mg/dL — ABNORMAL HIGH (ref 70–99)
POTASSIUM: 4.2 mmol/L (ref 3.5–5.1)
Sodium: 138 mmol/L (ref 135–145)

## 2018-02-16 LAB — PROTIME-INR
INR: 2.67
Prothrombin Time: 28 seconds — ABNORMAL HIGH (ref 11.4–15.2)

## 2018-02-16 LAB — CBC
HEMATOCRIT: 26.7 % — AB (ref 36.0–46.0)
HEMOGLOBIN: 7.6 g/dL — AB (ref 12.0–15.0)
MCH: 28 pg (ref 26.0–34.0)
MCHC: 28.5 g/dL — ABNORMAL LOW (ref 30.0–36.0)
MCV: 98.5 fL (ref 80.0–100.0)
Platelets: 323 10*3/uL (ref 150–400)
RBC: 2.71 MIL/uL — ABNORMAL LOW (ref 3.87–5.11)
RDW: 16.8 % — ABNORMAL HIGH (ref 11.5–15.5)
WBC: 5.6 10*3/uL (ref 4.0–10.5)
nRBC: 0 % (ref 0.0–0.2)

## 2018-02-16 MED ORDER — POLYETHYLENE GLYCOL 3350 17 G PO PACK
17.0000 g | PACK | Freq: Every day | ORAL | Status: DC
  Administered 2018-02-16 – 2018-02-21 (×6): 17 g via ORAL
  Filled 2018-02-16 (×6): qty 1

## 2018-02-16 MED ORDER — WARFARIN SODIUM 5 MG PO TABS
6.0000 mg | ORAL_TABLET | Freq: Once | ORAL | Status: AC
  Administered 2018-02-16: 6 mg via ORAL
  Filled 2018-02-16: qty 1

## 2018-02-16 NOTE — Progress Notes (Signed)
ANTICOAGULATION CONSULT NOTE -follow up [facility] for Coumadin Indication: Factor V deficiency(hypercoaguable state)  Allergies  Allergen Reactions  . Dilaudid [Hydromorphone Hcl] Hives and Nausea Only  . Minocycline Hcl     REACTION: Dizzy  . Prednisone     REACTION: feels like throat swelling, hallucinations  . Varenicline Tartrate     REACTION: Dizzy(chantix)   . Zocor [Simvastatin - High Dose] Other (See Comments)    myalgia    Patient Measurements: Height: 5\' 4"  (162.6 cm) Weight: 173 lb 4.5 oz (78.6 kg) IBW/kg (Calculated) : 54.7  Vital Signs: Temp: 97.9 F (36.6 C) (02/08 0604) Temp Source: Oral (02/08 0604) BP: 116/57 (02/08 0604) Pulse Rate: 88 (02/08 0604)  Labs: Recent Labs    02/15/18 1618 02/15/18 1701 02/16/18 0704  HGB 8.1*  --  7.6*  HCT 28.4*  --  26.7*  PLT 299  --  323  LABPROT  --  22.9* 28.0*  INR  --  2.05 2.67  CREATININE 0.75  --  0.90  TROPONINI  --  <0.03  --     Estimated Creatinine Clearance: 61.6 mL/min (by C-G formula based on SCr of 0.9 mg/dL).   Medical History: Past Medical History:  Diagnosis Date  . Acute MI ([place])    x4, code blue x3  . Arteriosclerotic cardiovascular disease (ASCVD) 2002   Inf STEMI-2002. 2003-cutting balloon + brachytherapy for restenosis; subsequent acute stent thrombosis 06/2010 requiring 2 separate interventions (Zeta stent, then repeat cath with thrombectomy). focal basal inf AK, nl EF; 03/2011: Patent stents, minor nonobst  residual dz, nl EF; neg stress nuclear in 2008 and stress echo in 2009  . Chronic anticoagulation    Warfarin plus ticagrelor  . Chronic respiratory failure ([place]) 08/27/2013   On 2L 02  . 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 COPD (chronic obstructive pulmonary disease) ([place])   . Factor 5 Leiden mutation, heterozygous ([place])   . Factor V Leiden, prothrombin gene mutation ([place]) 2006  . Hyperlipidemia   . Hypothyroidism   .  Noncompliance   . Pelvic fracture ([place]) 2009  . Pulmonary embolism ([place]) 2006   Associated with deep vein thrombosis-2006; + factor V Leiden  . Tobacco abuse    50 pack years    Medications:  See med rec  Assessment: Patient presented to ED with dyspnea and fatigue. Patient is on chronic coumadin therapy for Factor V Leiden deficiency Pharmacy asked to dose Coumadin. . INR is therapeutic this AM but sudden increase of INR. Home dose of warfarin: 5 mg Mon, Wed, Friday and 6mg  all other days  Goal of Therapy:  INR 2-3 Monitor platelets by anticoagulation protocol: Yes   Plan:  Coumadin 6mg  po x 1 PT-INR daily Monitor for S/S of bleeding  Isac Sarna, BS [place], BCPS Clinical Pharmacist Pager [phone] 02/16/2018,11:27 AM

## 2018-02-16 NOTE — ED Provider Notes (Addendum)
[facility] Provider Note   CSN: [id number] Arrival date & time: 02/15/18  1335     History   Chief Complaint Chief Complaint  Patient presents with  . Shortness of Breath    HPI [name] is a 68 y.o. female.  Patient complains of shortness of breath.  Patient has a history of COPD  The history is provided by the patient.  Shortness of Breath  Severity:  Moderate Onset quality:  Gradual Timing:  Constant Progression:  Worsening Chronicity:  Recurrent Context: activity   Relieved by:  Nothing Ineffective treatments:  Inhaler Associated symptoms: no abdominal pain, no chest pain, no cough, no headaches and no rash   Risk factors: no recent alcohol use     Past Medical History:  Diagnosis Date  . Acute MI ([place])    x4, code blue x3  . Arteriosclerotic cardiovascular disease (ASCVD) 2002   Inf STEMI-2002. 2003-cutting balloon + brachytherapy for restenosis; subsequent acute stent thrombosis 06/2010 requiring 2 separate interventions (Zeta stent, then repeat cath with thrombectomy). focal basal inf AK, nl EF; 03/2011: Patent stents, minor nonobst  residual dz, nl EF; neg stress nuclear in 2008 and stress echo in 2009  . Chronic anticoagulation    Warfarin plus ticagrelor  . Chronic respiratory failure ([place]) 08/27/2013   On 2L 02  . 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 COPD (chronic obstructive pulmonary disease) ([place])   . Factor 5 Leiden mutation, heterozygous ([place])   . Factor V Leiden, prothrombin gene mutation ([place]) 2006  . Hyperlipidemia   . Hypothyroidism   . Noncompliance   . Pelvic fracture ([place]) 2009  . Pulmonary embolism ([place]) 2006   Associated with deep vein thrombosis-2006; + factor V Leiden  . Tobacco abuse    50 pack years    Patient Active Problem List   Diagnosis Date Noted  . Acute on chronic respiratory failure with hypoxia and hypercapnia ([place]) 01/29/2018  . Acute and chronic  respiratory failure (acute-on-chronic) ([place]) 01/29/2018  . Pressure injury of skin 01/29/2018  . Hyperglycemia 01/21/2018  . COPD with exacerbation ([place]) 01/21/2018  . Vertebral fracture, osteoporotic ([place]) 01/20/2018  . Rectal bleeding 09/05/2017  . Left lower quadrant pain 09/05/2017  . Oxygen dependent 09/05/2017  . Chronic back pain 05/16/2017  . COPD exacerbation ([place]) 02/19/2017  . Tachycardia 02/19/2017  . Influenza A 02/19/2017  . Acute respiratory failure ([place]) 02/19/2017  . Dyspnea 11/26/2014  . Loss of weight 11/26/2014  . Chronic respiratory failure ([place]) 08/27/2013  . Chest pain 08/24/2013  . Encounter for therapeutic drug monitoring 02/13/2013  . Abnormal weight loss 03/21/2012  . Cerebrovascular disease 11/15/2011  . CAD S/P percutaneous coronary angioplasty   . Factor V Leiden, prothrombin gene mutation ([place])   . Hyperlipidemia   . Chronic anticoagulation   . Hypothyroidism 11/23/2006  . TOBACCO ABUSE 11/23/2006  . History of pulmonary embolus (PE) 11/23/2006  . 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 COLONOSCOPY  Approximately 2000   Negative screening study  . CORONARY ANGIOPLASTY  2002, 2003, 2012  . LEFT AND RIGHT HEART CATHETERIZATION WITH CORONARY ANGIOGRAM N/A 04/03/2011   Procedure: LEFT AND RIGHT HEART CATHETERIZATION WITH CORONARY ANGIOGRAM;  Surgeon: Sherren Mocha, MD;  Location: [facility] C[facility] LAB;  Service: Cardiovascular;  Laterality: N/A;     OB History    Gravida  3  Para  2   Term  2   Preterm      AB  1   Living        SAB      TAB  1   Ectopic      Multiple      Live Births               Home Medications    Prior to Admission medications   Medication Sig Start Date End Date Taking? Authorizing Provider  albuterol (PROVENTIL) (2.5 MG/3ML) 0.083% nebulizer solution USE 1 VIAL BY NEBULIZER EVERY 4 HOURS AS NEEDED FOR WHEEZING.  DX: J44.9 Patient taking differently: Take 2.5 mg by nebulization every 4 (four) hours as needed for wheezing or shortness of breath.  01/24/17  Yes Magdalen Spatz, NP  arformoterol (BROVANA) 15 MCG/2ML NEBU Take 2 mLs (15 mcg total) by nebulization 2 (two) times daily. 09/07/17  Yes Juanito Doom, MD  baclofen (LIORESAL) 10 MG tablet Take 1 tablet (10 mg total) by mouth 3 (three) times daily. 01/24/18  Yes Lavina Hamman, MD  benzonatate (TESSALON) 100 MG capsule Take 1 capsule (100 mg total) by mouth 2 (two) times daily as needed for cough. 08/30/17  Yes Nafziger, Tommi Rumps, NP  budesonide (PULMICORT) 0.5 MG/2ML nebulizer solution Take 2 mLs (0.5 mg total) by nebulization 2 (two) times daily. Dx: J43.9 01/24/17  Yes Magdalen Spatz, NP  buPROPion [facility] SR) 100 MG 12 hr tablet Take 100 mg by mouth every morning.   Yes [provider]  fentaNYL (DURAGESIC) 50 MCG/HR Place 1 patch onto the skin every 3 (three) days. 02/01/18  Yes Johnson, Clanford L, MD  ferrous sulfate 325 (65 FE) MG tablet Take 325 mg by mouth daily with breakfast.   Yes [provider]  HYDROcodone-acetaminophen (NORCO/VICODIN) 5-325 MG tablet Take 1 tablet by mouth every 4 (four) hours as needed for moderate pain.   Yes [provider]  ipratropium-albuterol (DUONEB) 0.5-2.5 (3) MG/3ML SOLN Take 3 mLs by nebulization 4 (four) times daily. 03/20/17  Yes Magdalen Spatz, NP  levothyroxine (SYNTHROID, LEVOTHROID) 125 MCG tablet Take 1 tablet (125 mcg total) by mouth daily. 08/16/17  Yes Nafziger, Tommi Rumps, NP  lidocaine (LIDODERM) 5 % Place 1 patch onto the skin daily. Remove & Discard patch within 12 hours or as directed by MD 01/24/18  Yes Lavina Hamman, MD  nitroGLYCERIN (NITROSTAT) 0.4 MG SL tablet Place 1 tablet (0.4 mg total) under the tongue every 5 (five) minutes as needed for chest pain. 10/28/14  Yes Herminio Commons, MD  Omega-3 Fatty Acids (FISH OIL) 600 MG CAPS Take 1 capsule by mouth daily.    Yes  [provider]  OXYGEN Inhale 3-4 L into the lungs continuous. 3L continuous and 4L with exertion   Yes [provider]  ticagrelor (BRILINTA) 60 MG TABS tablet Take 1 tablet (60 mg total) by mouth 2 (two) times daily. 02/01/18  Yes Johnson, Clanford L, MD  traZODone (DESYREL) 50 MG tablet Take 50 mg by mouth at bedtime as needed for sleep.   Yes [provider]  warfarin (COUMADIN) 1 MG tablet Take 1 mg by mouth See admin instructions. Alternates taking 6mg  with 5mg  daily. Takes 5mg  on Mondays, Wednesdays, and Fridays. Takes 6mg  on all other days   Yes [provider]  warfarin (COUMADIN) 5 MG tablet Take 5 mg by mouth See admin instructions. Alternates taking 6mg  with 5mg  daily. Takes 5mg  on  Mondays, Wednesdays, and Fridays. Takes 6mg  on all other days   Yes [provider]  polyethylene glycol (MIRALAX / GLYCOLAX) packet Take 17 g by mouth daily. Patient not taking: Reported on 02/15/2018 01/25/18   Lavina Hamman, MD  Respiratory Therapy Supplies (FLUTTER) DEVI Use as directed 05/14/15   Javier Glazier, MD    Family History Family History  Problem Relation Age of Onset  . Emphysema Mother   . Alzheimer's disease Mother   . Emphysema Sister   . Heart disease Father   . Factor V Leiden deficiency Father   . Factor V Leiden deficiency Sister   . Factor V Leiden deficiency Daughter   . Kidney cancer Paternal Grandmother     Social History Social History   Tobacco Use  . Smoking status: Current Some Day Smoker    Packs/day: 0.50    Years: 50.00    Pack years: 25.00    Types: Cigarettes    Start date: 05/10/1968  . Smokeless tobacco: Never Used  Substance Use Topics  . Alcohol use: No    Alcohol/week: 0.0 standard drinks  . Drug use: No     Allergies   Dilaudid [hydromorphone hcl]; Minocycline hcl; Prednisone; Varenicline tartrate; and Zocor [simvastatin - high dose]   Review of Systems Review of Systems  Constitutional:  Negative for appetite change and fatigue.  HENT: Negative for congestion, ear discharge and sinus pressure.   Eyes: Negative for discharge.  Respiratory: Positive for shortness of breath. Negative for cough.   Cardiovascular: Negative for chest pain.  Gastrointestinal: Negative for abdominal pain and diarrhea.  Genitourinary: Negative for frequency and hematuria.  Musculoskeletal: Negative for back pain.  Skin: Negative for rash.  Neurological: Negative for seizures and headaches.  Psychiatric/Behavioral: Negative for hallucinations.     Physical Exam Updated Vital Signs BP (!) 116/57 (BP Location: Right Arm)   Pulse 88   Temp 97.9 F (36.6 C) (Oral)   Resp 18   Ht 5\' 4"  (1.626 m)   Wt 78.6 kg   SpO2 97%   BMI 29.74 kg/m   Physical Exam Vitals signs reviewed.  Constitutional:      Appearance: She is well-developed.  HENT:     Head: Normocephalic.     Nose: Nose normal.  Eyes:     General: No scleral icterus.    Conjunctiva/sclera: Conjunctivae normal.  Neck:     Musculoskeletal: Neck supple.     Thyroid: No thyromegaly.  Cardiovascular:     Rate and Rhythm: Normal rate and regular rhythm.     Heart sounds: No murmur. No friction rub. No gallop.   Pulmonary:     Breath sounds: No stridor. Wheezing present. No rales.  Chest:     Chest wall: No tenderness.  Abdominal:     General: There is no distension.     Tenderness: There is no abdominal tenderness. There is no rebound.  Musculoskeletal: Normal range of motion.  Lymphadenopathy:     Cervical: No cervical adenopathy.  Skin:    Findings: No erythema or rash.  Neurological:     Mental Status: She is oriented to person, place, and time.     Motor: No abnormal muscle tone.     Coordination: Coordination normal.  Psychiatric:        Behavior: Behavior normal.      ED Treatments / Results  Labs (all labs ordered are listed, but only abnormal results are displayed) Labs Reviewed  BASIC METABOLIC PANEL -  Abnormal; Notable for the following components:      Result Value   Calcium 8.0 (*)    All other components within normal limits  CBC - Abnormal; Notable for the following components:   RBC 2.90 (*)    Hemoglobin 8.1 (*)    HCT 28.4 (*)    MCHC 28.5 (*)    RDW 16.2 (*)    All other components within normal limits  PROTIME-INR - Abnormal; Notable for the following components:   Prothrombin Time 22.9 (*)    All other components within normal limits  BASIC METABOLIC PANEL - Abnormal; Notable for the following components:   Chloride 97 (*)    CO2 33 (*)    Glucose, Bld 179 (*)    Calcium 8.1 (*)    All other components within normal limits  CBC - Abnormal; Notable for the following components:   RBC 2.71 (*)    Hemoglobin 7.6 (*)    HCT 26.7 (*)    MCHC 28.5 (*)    RDW 16.8 (*)    All other components within normal limits  PROTIME-INR - Abnormal; Notable for the following components:   Prothrombin Time 28.0 (*)    All other components within normal limits  TROPONIN I    EKG EKG Interpretation  Date/Time:  Friday February 15 2018 13:55:38 EST Ventricular Rate:  86 PR Interval:  164 QRS Duration: 96 QT Interval:  374 QTC Calculation: 447 R Axis:   89 Text Interpretation:  Normal sinus rhythm Cannot rule out Anterior infarct , age undetermined T wave abnormality, consider inferior ischemia Abnormal ECG Confirmed by Milton Ferguson [phone]) on 02/15/2018 6:32:07 PM   Radiology Dg Chest 2 View  Result Date: 02/15/2018 CLINICAL DATA:  Shortness of breath. EXAM: CHEST - 2 VIEW COMPARISON:  02/02/2018.  01/28/2018.  08/27/2013.  CT 11/14/2017. FINDINGS: Mediastinum hilar structures normal. Stable cardiomegaly with normal pulmonary vascularity. Chronic interstitial changes consistent chronic interstitial lung disease and bilateral pleural-parenchymal thickening consistent scarring again noted. Similar findings noted on prior exams. Superimposed active interstitial lung disease can not  be completely excluded. No pneumothorax. IMPRESSION: 1. Chronic interstitial lung disease again noted. Similar findings noted on multiple prior exams. Superimposed acute active interstitial disease can not be excluded. Bilateral pleural-parenchymal scarring. 2.  Stable cardiomegaly. Electronically Signed   By: Marcello Moores  Register   On: 02/15/2018 17:20    Procedures Procedures (including critical care time)  Medications Ordered in ED Medications  acetaminophen (TYLENOL) tablet 650 mg (has no administration in time range)    Or  acetaminophen (TYLENOL) suppository 650 mg (has no administration in time range)  ondansetron (ZOFRAN) tablet 4 mg (has no administration in time range)    Or  ondansetron (ZOFRAN) injection 4 mg (has no administration in time range)  albuterol (PROVENTIL) (2.5 MG/3ML) 0.083% nebulizer solution 2.5 mg (has no administration in time range)  ipratropium-albuterol (DUONEB) 0.5-2.5 (3) MG/3ML nebulizer solution 3 mL (3 mLs Nebulization Given 02/16/18 0805)  methylPREDNISolone sodium succinate (SOLU-MEDROL) 125 mg/2 mL injection 60 mg (60 mg Intravenous Given 02/16/18 0933)  guaiFENesin (MUCINEX) 12 hr tablet 1,200 mg (1,200 mg Oral Given 02/16/18 0932)  arformoterol (BROVANA) nebulizer solution 15 mcg (15 mcg Nebulization Given 02/16/18 0805)  baclofen (LIORESAL) tablet 10 mg (10 mg Oral Given 02/16/18 0932)  benzonatate (TESSALON) capsule 100 mg (has no administration in time range)  buPROPion (WELLBUTRIN SR) 12 hr tablet 100 mg (100 mg Oral Given 02/16/18 0932)  fentaNYL (DURAGESIC) 50 MCG/HR 1 patch (  1 patch Transdermal Not Given 02/16/18 0528)  ferrous sulfate tablet 325 mg (325 mg Oral Given 02/16/18 0932)  HYDROcodone-acetaminophen (NORCO/VICODIN) 5-325 MG per tablet 1 tablet (has no administration in time range)  levothyroxine (SYNTHROID, LEVOTHROID) tablet 125 mcg (125 mcg Oral Given 02/16/18 0605)  lidocaine (LIDODERM) 5 % 1 patch (1 patch Transdermal Patch Applied 02/16/18 0054)    senna-docusate (Senokot-S) tablet 2 tablet (2 tablets Oral Given 02/16/18 0932)  ticagrelor (BRILINTA) tablet 60 mg (60 mg Oral Given 02/16/18 0053)  traZODone (DESYREL) tablet 50 mg (50 mg Oral Given 02/16/18 0053)  Warfarin - Pharmacist Dosing Inpatient (has no administration in time range)  warfarin (COUMADIN) tablet 6 mg (has no administration in time range)  albuterol (PROVENTIL) (2.5 MG/3ML) 0.083% nebulizer solution 5 mg (5 mg Nebulization Given 02/15/18 1450)  ipratropium-albuterol (DUONEB) 0.5-2.5 (3) MG/3ML nebulizer solution 3 mL (3 mLs Nebulization Given 02/15/18 1701)  albuterol (PROVENTIL) (2.5 MG/3ML) 0.083% nebulizer solution 2.5 mg (2.5 mg Nebulization Given 02/15/18 1701)  magnesium sulfate IVPB 2 g 50 mL (0 g Intravenous Stopped 02/15/18 2042)  warfarin (COUMADIN) tablet 5 mg (5 mg Oral Given 02/16/18 0054)     Initial Impression / Assessment and Plan / ED Course  I have reviewed the triage vital signs and the nursing notes.  Pertinent labs & imaging results that were available during my care of the patient were reviewed by me and considered in my medical decision making (see chart for details). CRITICAL CARE Performed by: Milton Ferguson Total critical care time:35 minutes Critical care time was exclusive of separately billable procedures and treating other patients. Critical care was necessary to treat or prevent imminent or life-threatening deterioration. Critical care was time spent personally by me on the following activities: development of treatment plan with patient and/or surrogate as well as nursing, discussions with consultants, evaluation of patient's response to treatment, examination of patient, obtaining history from patient or surrogate, ordering and performing treatments and interventions, ordering and review of laboratory studies, ordering and review of radiographic studies, pulse oximetry and re-evaluation of patient's condition.     Patient with COPD exacerbation.   She will be admitted to medicine  Final Clinical Impressions(s) / ED Diagnoses   Final diagnoses:  COPD exacerbation [facility])    ED Discharge Orders    None       Milton Ferguson, MD 02/16/18 1248    Milton Ferguson, MD 02/26/18 1256

## 2018-02-16 NOTE — Progress Notes (Addendum)
PROGRESS NOTE  [name]  HGD:[id number]  DOB: 18-Aug-1950  DOA: 02/15/2018 PCP: [name], NP   Brief Admission Hx: 68 y.o. female with medical history significant for chronic respiratory failure on 4 L O2 via [place] at home, COPD, CAD, hypothyroidism, and hypercoagulable state/factor V Leiden deficiency on chronic Coumadin who presents to the ED with complaint of progressive shortness of breath, fatigue, nonproductive cough, and difficulty caring for oneself.   MDM/Assessment & Plan:   1. Acute on chronic respiratory failure with hypoxia / COPD Exacerbation -She is improving with intensive hospital treatments with IV steroids, antibiotics, scheduled nebs, and supportive therapy.Goal pulse ox 88-92%.  2. Tobacco abuse - Pt strongly advised to please stop all tobacco use. Nicotine patch ordered as needed for cravings.  3. History of VTE and Factor V Leiden - pt is chronically anticoagulated with warfarin. Appreciate pharmacy for assisting with management. PHARM D TO ADJUST WARFARIN DOSE AS NEEDED FOR GOAL INR 2-3.  4. CAD - cycled troponins, no s/s of ACS. Continue home medical management.  5. Traumatic and osteoporotic L3 fracture - Pt was recently seen by spine surgery at [facility] and surgery was not recommended. Pt to continue to wear TLSO brace, pain management and PT recommended SNF. She was sent to SNF just prior to this admission. Pt was supposed to have neurosurgery follow up with Adele Dan NP in 1 week but it does not appear that she has followed up. She will need to reschedule a follow up appt.    6. Pain management - Pt has duragesic patch and breakthrough pain managed with hydrocodone/APAP.  7. Hypothyroidism - resumed home levothyroxine.  8. Anemia - Hg 8.1 on admission, down to 7.6.  Monitor for bleeding.  Hemoccult stools. Type and screen. Transfuse for Hg <7.   DVT prophylaxis:warfarin  Code Status:full  Family Communication:patient  Disposition  Plan:anticipating SNF at discharge  Consultants:  pharmacy  Procedures:  n/a  Antimicrobials:  N/a   Subjective: Pt says that her breathing is a little better this morning.    Objective: Vitals:   02/16/18 0325 02/16/18 0604 02/16/18 0805 02/16/18 1257  BP: (!) 112/51 (!) 116/57  (!) 109/57  Pulse: 79 88  88  Resp: 18 18  20   Temp: 98 F (36.7 C) 97.9 F (36.6 C)  97.8 F (36.6 C)  TempSrc: Oral Oral  Oral  SpO2: 98% 99% 97% 96%  Weight:      Height:       No intake or output data in the 24 hours ending 02/16/18 1615 Filed Weights   02/15/18 1343 02/16/18 0319  Weight: 76.2 kg 78.6 kg   REVIEW OF SYSTEMS  As per history otherwise all reviewed and reported negative  Exam:  General exam:awake, alert, NAD.  Respiratory system:shallow air movement bilateral, bilateral exp wheezing, no rales.  Cardiovascular system:S1 &S2 heard, RRR. No JVD, murmurs, gallops, clicks or pedal edema. Gastrointestinal system:Abdomen is nondistended, soft and nontender. Normal bowel sounds heard. Central nervous system:Alert and oriented. No focal neurological deficits. Extremities: 2+ pitting edema bilateral LEs.   Data Reviewed: Basic Metabolic Panel: Recent Labs  Lab 02/15/18 1618 02/16/18 0704  NA 138 138  K 3.7 4.2  CL 99 97*  CO2 32 33*  GLUCOSE 96 179*  BUN 18 18  CREATININE 0.75 0.90  CALCIUM 8.0* 8.1*   Liver Function Tests: No results for input(s): AST, ALT, ALKPHOS, BILITOT, PROT, ALBUMIN in the last 168 hours. No results for input(s): LIPASE, AMYLASE  in the last 168 hours. No results for input(s): AMMONIA in the last 168 hours. CBC: Recent Labs  Lab 02/15/18 1618 02/16/18 0704  WBC 5.5 5.6  HGB 8.1* 7.6*  HCT 28.4* 26.7*  MCV 97.9 98.5  PLT 299 323   Cardiac Enzymes: Recent Labs  Lab 02/15/18 1701  TROPONINI <0.03   CBG (last 3)  No results for input(s): GLUCAP in the last 72 hours. No results found for this or any previous visit (from  the past 240 hour(s)).   Studies: Dg Chest 2 View  Result Date: 02/15/2018 CLINICAL DATA:  Shortness of breath. EXAM: CHEST - 2 VIEW COMPARISON:  02/02/2018.  01/28/2018.  08/27/2013.  CT 11/14/2017. FINDINGS: Mediastinum hilar structures normal. Stable cardiomegaly with normal pulmonary vascularity. Chronic interstitial changes consistent chronic interstitial lung disease and bilateral pleural-parenchymal thickening consistent scarring again noted. Similar findings noted on prior exams. Superimposed active interstitial lung disease can not be completely excluded. No pneumothorax. IMPRESSION: 1. Chronic interstitial lung disease again noted. Similar findings noted on multiple prior exams. Superimposed acute active interstitial disease can not be excluded. Bilateral pleural-parenchymal scarring. 2.  Stable cardiomegaly. Electronically Signed   By: Marcello Moores  Register   On: 02/15/2018 17:20   Scheduled Meds: . arformoterol  15 mcg Nebulization BID  . baclofen  10 mg Oral TID  . buPROPion  100 mg Oral q morning - 10a  . fentaNYL  1 patch Transdermal Q72H  . ferrous sulfate  325 mg Oral Q breakfast  . guaiFENesin  1,200 mg Oral BID  . ipratropium-albuterol  3 mL Nebulization Q6H  . levothyroxine  125 mcg Oral Q0600  . lidocaine  1 patch Transdermal Q24H  . methylPREDNISolone (SOLU-MEDROL) injection  60 mg Intravenous Q6H  . senna-docusate  2 tablet Oral BID  . ticagrelor  60 mg Oral BID  . Warfarin - Pharmacist Dosing Inpatient   Does not apply q1800   Continuous Infusions:  Principal Problem:   COPD exacerbation (HCC) Active Problems:   Hypothyroidism   CAD S/P percutaneous coronary angioplasty   Factor V Leiden, prothrombin gene mutation ([place])   Chronic anticoagulation   Chronic respiratory failure ([place])  Time spent:   Irwin Brakeman, MD [facility] 02/16/2018, 4:15 PM    LOS: 0 days  How to contact the [facility] Attending or Consulting provider [place] or covering provider during  after hours [place], for this patient?  1. Check the care team in [facility] and look for a) attending/consulting TRH provider listed and b) the [facility] team listed 2. Log into [email or web address] and use [place]'s universal password to access. If you do not have the password, please contact the hospital operator. 3. Locate the [facility] provider you are looking for under [facility] and page to a number that you can be directly reached. 4. If you still have difficulty reaching the provider, please page the [facility] (Director on Call) for the Hospitalists listed on amion for assistance.

## 2018-02-17 ENCOUNTER — Encounter (HOSPITAL_COMMUNITY): Payer: Self-pay

## 2018-02-17 ENCOUNTER — Observation Stay (HOSPITAL_COMMUNITY): Payer: Medicare Other

## 2018-02-17 DIAGNOSIS — Z86718 Personal history of other venous thrombosis and embolism: Secondary | ICD-10-CM | POA: Diagnosis not present

## 2018-02-17 DIAGNOSIS — D6852 Prothrombin gene mutation: Secondary | ICD-10-CM | POA: Diagnosis present

## 2018-02-17 DIAGNOSIS — I2510 Atherosclerotic heart disease of native coronary artery without angina pectoris: Secondary | ICD-10-CM | POA: Diagnosis present

## 2018-02-17 DIAGNOSIS — F17210 Nicotine dependence, cigarettes, uncomplicated: Secondary | ICD-10-CM | POA: Diagnosis present

## 2018-02-17 DIAGNOSIS — R0602 Shortness of breath: Secondary | ICD-10-CM | POA: Diagnosis not present

## 2018-02-17 DIAGNOSIS — G8929 Other chronic pain: Secondary | ICD-10-CM | POA: Diagnosis present

## 2018-02-17 DIAGNOSIS — Z888 Allergy status to other drugs, medicaments and biological substances status: Secondary | ICD-10-CM | POA: Diagnosis not present

## 2018-02-17 DIAGNOSIS — Z9861 Coronary angioplasty status: Secondary | ICD-10-CM | POA: Diagnosis not present

## 2018-02-17 DIAGNOSIS — Z7901 Long term (current) use of anticoagulants: Secondary | ICD-10-CM | POA: Diagnosis not present

## 2018-02-17 DIAGNOSIS — X58XXXA Exposure to other specified factors, initial encounter: Secondary | ICD-10-CM | POA: Diagnosis present

## 2018-02-17 DIAGNOSIS — M8008XA Age-related osteoporosis with current pathological fracture, vertebra(e), initial encounter for fracture: Secondary | ICD-10-CM | POA: Diagnosis present

## 2018-02-17 DIAGNOSIS — E039 Hypothyroidism, unspecified: Secondary | ICD-10-CM | POA: Diagnosis present

## 2018-02-17 DIAGNOSIS — Z881 Allergy status to other antibiotic agents status: Secondary | ICD-10-CM | POA: Diagnosis not present

## 2018-02-17 DIAGNOSIS — D6859 Other primary thrombophilia: Secondary | ICD-10-CM | POA: Diagnosis present

## 2018-02-17 DIAGNOSIS — J441 Chronic obstructive pulmonary disease with (acute) exacerbation: Secondary | ICD-10-CM | POA: Diagnosis present

## 2018-02-17 DIAGNOSIS — E785 Hyperlipidemia, unspecified: Secondary | ICD-10-CM | POA: Diagnosis present

## 2018-02-17 DIAGNOSIS — Z86711 Personal history of pulmonary embolism: Secondary | ICD-10-CM | POA: Diagnosis not present

## 2018-02-17 DIAGNOSIS — I252 Old myocardial infarction: Secondary | ICD-10-CM | POA: Diagnosis not present

## 2018-02-17 DIAGNOSIS — M5489 Other dorsalgia: Secondary | ICD-10-CM | POA: Diagnosis not present

## 2018-02-17 DIAGNOSIS — Z743 Need for continuous supervision: Secondary | ICD-10-CM | POA: Diagnosis not present

## 2018-02-17 DIAGNOSIS — Z9981 Dependence on supplemental oxygen: Secondary | ICD-10-CM | POA: Diagnosis not present

## 2018-02-17 DIAGNOSIS — J9611 Chronic respiratory failure with hypoxia: Secondary | ICD-10-CM | POA: Diagnosis not present

## 2018-02-17 DIAGNOSIS — S32039A Unspecified fracture of third lumbar vertebra, initial encounter for closed fracture: Secondary | ICD-10-CM | POA: Diagnosis present

## 2018-02-17 DIAGNOSIS — Z8249 Family history of ischemic heart disease and other diseases of the circulatory system: Secondary | ICD-10-CM | POA: Diagnosis not present

## 2018-02-17 DIAGNOSIS — R600 Localized edema: Secondary | ICD-10-CM | POA: Diagnosis not present

## 2018-02-17 DIAGNOSIS — D6851 Activated protein C resistance: Secondary | ICD-10-CM | POA: Diagnosis present

## 2018-02-17 DIAGNOSIS — D649 Anemia, unspecified: Secondary | ICD-10-CM | POA: Diagnosis present

## 2018-02-17 DIAGNOSIS — J9621 Acute and chronic respiratory failure with hypoxia: Secondary | ICD-10-CM | POA: Diagnosis present

## 2018-02-17 DIAGNOSIS — Z885 Allergy status to narcotic agent status: Secondary | ICD-10-CM | POA: Diagnosis not present

## 2018-02-17 DIAGNOSIS — Z825 Family history of asthma and other chronic lower respiratory diseases: Secondary | ICD-10-CM | POA: Diagnosis not present

## 2018-02-17 DIAGNOSIS — R52 Pain, unspecified: Secondary | ICD-10-CM | POA: Diagnosis not present

## 2018-02-17 LAB — CBC
HCT: 23.4 % — ABNORMAL LOW (ref 36.0–46.0)
Hemoglobin: 6.6 g/dL — CL (ref 12.0–15.0)
MCH: 27.6 pg (ref 26.0–34.0)
MCHC: 28.2 g/dL — ABNORMAL LOW (ref 30.0–36.0)
MCV: 97.9 fL (ref 80.0–100.0)
Platelets: 298 10*3/uL (ref 150–400)
RBC: 2.39 MIL/uL — ABNORMAL LOW (ref 3.87–5.11)
RDW: 16.8 % — AB (ref 11.5–15.5)
WBC: 7.2 10*3/uL (ref 4.0–10.5)
nRBC: 0.3 % — ABNORMAL HIGH (ref 0.0–0.2)

## 2018-02-17 LAB — PROTIME-INR
INR: 4.53
Prothrombin Time: 42.3 seconds — ABNORMAL HIGH (ref 11.4–15.2)

## 2018-02-17 LAB — IRON AND TIBC
Iron: 17 ug/dL — ABNORMAL LOW (ref 28–170)
Saturation Ratios: 6 % — ABNORMAL LOW (ref 10.4–31.8)
TIBC: 308 ug/dL (ref 250–450)
UIBC: 291 ug/dL

## 2018-02-17 LAB — RETICULOCYTES
Immature Retic Fract: 41.3 % — ABNORMAL HIGH (ref 2.3–15.9)
RBC.: 2.39 MIL/uL — ABNORMAL LOW (ref 3.87–5.11)
Retic Count, Absolute: 147.2 10*3/uL (ref 19.0–186.0)
Retic Ct Pct: 6.2 % — ABNORMAL HIGH (ref 0.4–3.1)

## 2018-02-17 LAB — PREPARE RBC (CROSSMATCH)

## 2018-02-17 LAB — ABO/RH: ABO/RH(D): A POS

## 2018-02-17 LAB — VITAMIN B12: Vitamin B-12: 386 pg/mL (ref 180–914)

## 2018-02-17 LAB — FOLATE: Folate: 4.1 ng/mL — ABNORMAL LOW (ref >5.9)

## 2018-02-17 LAB — FERRITIN: Ferritin: 43 ng/mL (ref 11–307)

## 2018-02-17 MED ORDER — PANTOPRAZOLE SODIUM 40 MG PO TBEC
40.0000 mg | DELAYED_RELEASE_TABLET | Freq: Every day | ORAL | Status: DC
  Administered 2018-02-17 – 2018-02-21 (×5): 40 mg via ORAL
  Filled 2018-02-17 (×5): qty 1

## 2018-02-17 MED ORDER — SODIUM CHLORIDE 0.9% IV SOLUTION: Freq: Once | INTRAVENOUS | Status: DC

## 2018-02-17 NOTE — Progress Notes (Signed)
ANTICOAGULATION CONSULT NOTE -follow up [name] for Coumadin Indication: Factor V deficiency(hypercoaguable state)  Allergies  Allergen Reactions  . Dilaudid [Hydromorphone Hcl] Hives and Nausea Only  . Minocycline Hcl     REACTION: Dizzy  . Prednisone     REACTION: feels like throat swelling, hallucinations  . Varenicline Tartrate     REACTION: Dizzy(chantix)   . Zocor [Simvastatin - High Dose] Other (See Comments)    myalgia    Patient Measurements: Height: 5\' 4"  (162.6 cm) Weight: 175 lb 4.3 oz (79.5 kg) IBW/kg (Calculated) : 54.7  Vital Signs: Temp: 98.5 F (36.9 C) (02/09 0643) Temp Source: Oral (02/09 0643) BP: 118/56 (02/09 0643) Pulse Rate: 76 (02/09 0643)  Labs: Recent Labs    02/15/18 1618 02/15/18 1701 02/16/18 0704 02/17/18 0446  HGB 8.1*  --  7.6*  --   HCT 28.4*  --  26.7*  --   PLT 299  --  323  --   LABPROT  --  22.9* 28.0* 42.3*  INR  --  2.05 2.67 4.53*  CREATININE 0.75  --  0.90  --   TROPONINI  --  <0.03  --   --     Estimated Creatinine Clearance: 61.9 mL/min (by C-G formula based on SCr of 0.9 mg/dL).   Medical History: Past Medical History:  Diagnosis Date  . Acute MI ([place])    x4, code blue x3  . Arteriosclerotic cardiovascular disease (ASCVD) 2002   Inf STEMI-2002. 2003-cutting balloon + brachytherapy for restenosis; subsequent acute stent thrombosis 06/2010 requiring 2 separate interventions (Zeta stent, then repeat cath with thrombectomy). focal basal inf AK, nl EF; 03/2011: Patent stents, minor nonobst  residual dz, nl EF; neg stress nuclear in 2008 and stress echo in 2009  . Chronic anticoagulation    Warfarin plus ticagrelor  . Chronic respiratory failure ([place]) 08/27/2013   On 2L 02  . COPD (chronic obstructive pulmonary disease) ([place])    02 dependent  . COPD (chronic obstructive pulmonary disease) ([place])   . Factor 5 Leiden mutation, heterozygous ([place])   . Factor V Leiden, prothrombin gene mutation ([place]) 2006   . Hyperlipidemia   . Hypothyroidism   . Noncompliance   . Pelvic fracture ([place]) 2009  . Pulmonary embolism ([place]) 2006   Associated with deep vein thrombosis-2006; + factor V Leiden  . Tobacco abuse    50 pack years    Medications:  See med rec  Assessment: Patient presented to ED with dyspnea and fatigue. Patient is on chronic coumadin therapy for Factor V Leiden deficiency Pharmacy asked to dose Coumadin. INR is supratherapeutic this AM.  Home dose of warfarin: 5 mg Mon, Wed, Friday and 6mg  all other days  Goal of Therapy:  INR 2-3 Monitor platelets by anticoagulation protocol: Yes   Plan:  No Coumadin today PT-INR daily Monitor for S/S of bleeding  Isac Sarna, BS [place], BCPS Clinical Pharmacist Pager [phone] 02/17/2018,9:54 AM

## 2018-02-17 NOTE — Progress Notes (Addendum)
PROGRESS NOTE  [name]  FKC:[id number]  DOB: [birth year]  DOA: 02/15/2018 PCP: [name], NP   Brief Admission Hx: 68 y.o. female with medical history significant for chronic respiratory failure on 4 L O2 via [place] at home, COPD, CAD, hypothyroidism, and hypercoagulable state/factor V Leiden deficiency on chronic Coumadin who presents to the ED with complaint of progressive shortness of breath, fatigue, nonproductive cough, and difficulty caring for oneself.   MDM/Assessment & Plan:   1. Acute on chronic respiratory failure with hypoxia / COPD Exacerbation -She is improving with intensive hospital treatments with IV steroids, antibiotics, scheduled nebs, and supportive therapy.Goal pulse ox 88-92%.  2. Tobacco abuse - Pt strongly advised to please stop all tobacco use. Nicotine patch ordered as needed for cravings.  3. History of VTE and Factor V Leiden - pt is chronically anticoagulated with warfarin. Appreciate pharmacy for assisting with management. PHARM D TO ADJUST WARFARIN DOSE AS NEEDED FOR GOAL INR 2-3.  4. CAD - cycled troponins, no s/s of ACS. Continue home medical management.  5. Traumatic and osteoporotic L3 fracture - Pt was recently seen by spine surgery at [facility] and surgery was not recommended. Pt to continue to wear TLSO brace, pain management and PT recommended SNF. She was sent to SNF just prior to this admission. Pt was supposed to have neurosurgery follow up with Adele Dan NP in 1 week but it does not appear that she has followed up. She will need to reschedule a follow up appt at discharge.     6. Pain management - Pt has duragesic patch and breakthrough pain managed with hydrocodone/APAP.  7. Hypothyroidism - resumed home levothyroxine.  8. Anemia - Hg 8.1 on admission, down to 6.6. No bleeding reported. Hemoccult stools. pending Type and screen and transfuse 2 unit PRBC.  If heme positive, will request GI consult.   Check anemia panel.   DVT  prophylaxis:warfarin  Code Status:full  Family Communication:patient  Disposition Plan:anticipating SNF at discharge  Consultants:  pharmacy  Procedures:  n/a  Antimicrobials:  N/a   Subjective: Pt feels weak and hasn't gotten out of bed.     Objective: Vitals:   02/16/18 2149 02/17/18 0206 02/17/18 0643 02/17/18 1005  BP:   (!) 118/56   Pulse:   76   Resp:   20   Temp:   98.5 F (36.9 C)   TempSrc:   Oral   SpO2: 97% 98% 99% 100%  Weight:   79.5 kg   Height:        Intake/Output Summary (Last 24 hours) at 02/17/2018 1023 Last data filed at 02/17/2018 0700 Gross per 24 hour  Intake 720 ml  Output 1700 ml  Net -980 ml   Filed Weights   02/15/18 1343 02/16/18 0319 02/17/18 0643  Weight: 76.2 kg 78.6 kg 79.5 kg   REVIEW OF SYSTEMS  As per history otherwise all reviewed and reported negative  Exam:  General exam:awake, alert, NAD.  Respiratory system:better air movement bilateral, bilateral exp wheezing, no rales.  Cardiovascular system:S1 &S2 heard, RRR. No JVD, murmurs, gallops, clicks or pedal edema. Gastrointestinal system:Abdomen is nondistended, soft and nontender. Normal bowel sounds heard. Central nervous system:Alert and oriented. No focal neurological deficits. Extremities: 2+ pitting edema bilateral LEs.   Data Reviewed: Basic Metabolic Panel: Recent Labs  Lab 02/15/18 1618 02/16/18 0704  NA 138 138  K 3.7 4.2  CL 99 97*  CO2 32 33*  GLUCOSE 96 179*  BUN 18 18  CREATININE 0.75 0.90  CALCIUM 8.0* 8.1*   Liver Function Tests: No results for input(s): AST, ALT, ALKPHOS, BILITOT, PROT, ALBUMIN in the last 168 hours. No results for input(s): LIPASE, AMYLASE in the last 168 hours. No results for input(s): AMMONIA in the last 168 hours. CBC: Recent Labs  Lab 02/15/18 1618 02/16/18 0704  WBC 5.5 5.6  HGB 8.1* 7.6*  HCT 28.4* 26.7*  MCV 97.9 98.5  PLT 299 323   Cardiac Enzymes: Recent Labs  Lab 02/15/18 1701  TROPONINI  <0.03   CBG (last 3)  No results for input(s): GLUCAP in the last 72 hours. No results found for this or any previous visit (from the past 240 hour(s)).   Studies: Dg Chest 2 View  Result Date: 02/15/2018 CLINICAL DATA:  Shortness of breath. EXAM: CHEST - 2 VIEW COMPARISON:  02/02/2018.  01/28/2018.  08/27/2013.  CT 11/14/2017. FINDINGS: Mediastinum hilar structures normal. Stable cardiomegaly with normal pulmonary vascularity. Chronic interstitial changes consistent chronic interstitial lung disease and bilateral pleural-parenchymal thickening consistent scarring again noted. Similar findings noted on prior exams. Superimposed active interstitial lung disease can not be completely excluded. No pneumothorax. IMPRESSION: 1. Chronic interstitial lung disease again noted. Similar findings noted on multiple prior exams. Superimposed acute active interstitial disease can not be excluded. Bilateral pleural-parenchymal scarring. 2.  Stable cardiomegaly. Electronically Signed   By: Marcello Moores  Register   On: 02/15/2018 17:20   Scheduled Meds: . arformoterol  15 mcg Nebulization BID  . baclofen  10 mg Oral TID  . buPROPion  100 mg Oral q morning - 10a  . fentaNYL  1 patch Transdermal Q72H  . ferrous sulfate  325 mg Oral Q breakfast  . guaiFENesin  1,200 mg Oral BID  . ipratropium-albuterol  3 mL Nebulization Q6H  . levothyroxine  125 mcg Oral Q0600  . lidocaine  1 patch Transdermal Q24H  . methylPREDNISolone (SOLU-MEDROL) injection  60 mg Intravenous Q6H  . polyethylene glycol  17 g Oral Daily  . senna-docusate  2 tablet Oral BID  . ticagrelor  60 mg Oral BID  . Warfarin - Pharmacist Dosing Inpatient   Does not apply q1800   Continuous Infusions:  Principal Problem:   COPD exacerbation (HCC) Active Problems:   Hypothyroidism   CAD S/P percutaneous coronary angioplasty   Factor V Leiden, prothrombin gene mutation ([place])   Chronic anticoagulation   Chronic respiratory failure ([place])  Time spent:     Irwin Brakeman, MD [facility] 02/17/2018, 10:23 AM    LOS: 0 days  How to contact the [facility] Attending or Consulting provider [place] or covering provider during after hours [place], for this patient?  1. Check the care team in [facility] and look for a) attending/consulting TRH provider listed and b) the [facility] team listed 2. Log into [email or web address] and use [place]'s universal password to access. If you do not have the password, please contact the hospital operator. 3. Locate the [facility] provider you are looking for under [facility] and page to a number that you can be directly reached. 4. If you still have difficulty reaching the provider, please page the [facility] (Director on Call) for the Hospitalists listed on amion for assistance.

## 2018-02-18 ENCOUNTER — Ambulatory Visit: Payer: Medicare Other | Admitting: Physician Assistant

## 2018-02-18 LAB — BPAM RBC
Blood Product Expiration Date: 202003072359
Blood Product Expiration Date: 202003082359
ISSUE DATE / TIME: 202002091707
ISSUE DATE / TIME: 202002092251
Unit Type and Rh: 6200
Unit Type and Rh: 6200

## 2018-02-18 LAB — PROTIME-INR
INR: 3.32
Prothrombin Time: 33.2 seconds — ABNORMAL HIGH (ref 11.4–15.2)

## 2018-02-18 LAB — BASIC METABOLIC PANEL
Anion gap: 8 (ref 5–15)
BUN: 24 mg/dL — ABNORMAL HIGH (ref 8–23)
CO2: 32 mmol/L (ref 22–32)
Calcium: 8.1 mg/dL — ABNORMAL LOW (ref 8.9–10.3)
Chloride: 98 mmol/L (ref 98–111)
Creatinine, Ser: 0.78 mg/dL (ref 0.44–1.00)
GFR calc Af Amer: >60 mL/min (ref >60)
GFR calc non Af Amer: >60 mL/min (ref >60)
Glucose, Bld: 146 mg/dL — ABNORMAL HIGH (ref 70–99)
Potassium: 4.5 mmol/L (ref 3.5–5.1)
SODIUM: 138 mmol/L (ref 135–145)

## 2018-02-18 LAB — CBC
HCT: 29.7 % — ABNORMAL LOW (ref 36.0–46.0)
Hemoglobin: 9.1 g/dL — ABNORMAL LOW (ref 12.0–15.0)
MCH: 29.1 pg (ref 26.0–34.0)
MCHC: 30.6 g/dL (ref 30.0–36.0)
MCV: 94.9 fL (ref 80.0–100.0)
Platelets: 281 10*3/uL (ref 150–400)
RBC: 3.13 MIL/uL — ABNORMAL LOW (ref 3.87–5.11)
RDW: 17.1 % — ABNORMAL HIGH (ref 11.5–15.5)
WBC: 7.3 10*3/uL (ref 4.0–10.5)
nRBC: 0.4 % — ABNORMAL HIGH (ref 0.0–0.2)

## 2018-02-18 LAB — TYPE AND SCREEN
ABO/RH(D): A POS
Antibody Screen: NEGATIVE
Unit division: 0
Unit division: 0

## 2018-02-18 NOTE — Progress Notes (Signed)
ANTICOAGULATION CONSULT NOTE -follow up [place] for Coumadin Indication: Factor V deficiency(hypercoaguable state)  Allergies  Allergen Reactions  . Dilaudid [Hydromorphone Hcl] Hives and Nausea Only  . Minocycline Hcl     REACTION: Dizzy  . Prednisone     REACTION: feels like throat swelling, hallucinations  . Varenicline Tartrate     REACTION: Dizzy(chantix)   . Zocor [Simvastatin - High Dose] Other (See Comments)    myalgia    Patient Measurements: Height: 5\' 4"  (162.6 cm) Weight: 178 lb 2.1 oz (80.8 kg) IBW/kg (Calculated) : 54.7  Vital Signs: Temp: 97.9 F (36.6 C) (02/10 0602) Temp Source: Oral (02/10 0602) BP: 128/66 (02/10 0602) Pulse Rate: 71 (02/10 0602)  Labs: Recent Labs    02/15/18 1618  02/15/18 1701 02/16/18 0704 02/17/18 0446 02/17/18 1140 02/18/18 0630  HGB 8.1*  --   --  7.6*  --  6.6* 9.1*  HCT 28.4*  --   --  26.7*  --  23.4* 29.7*  PLT 299  --   --  323  --  298 281  LABPROT  --    < > 22.9* 28.0* 42.3*  --  33.2*  INR  --    < > 2.05 2.67 4.53*  --  3.32  CREATININE 0.75  --   --  0.90  --   --  0.78  TROPONINI  --   --  <0.03  --   --   --   --    < > = values in this interval not displayed.    Estimated Creatinine Clearance: 70.1 mL/min (by C-G formula based on SCr of 0.78 mg/dL).   Medical History: Past Medical History:  Diagnosis Date  . Acute MI ([place])    x4, code blue x3  . Arteriosclerotic cardiovascular disease (ASCVD) 2002   Inf STEMI-2002. 2003-cutting balloon + brachytherapy for restenosis; subsequent acute stent thrombosis 06/2010 requiring 2 separate interventions (Zeta stent, then repeat cath with thrombectomy). focal basal inf AK, nl EF; 03/2011: Patent stents, minor nonobst  residual dz, nl EF; neg stress nuclear in 2008 and stress echo in 2009  . Chronic anticoagulation    Warfarin plus ticagrelor  . Chronic respiratory failure ([place]) 08/27/2013   On 2L 02  . COPD (chronic obstructive pulmonary  disease) ([place])    02 dependent  . COPD (chronic obstructive pulmonary disease) ([place])   . Factor 5 Leiden mutation, heterozygous ([place])   . Factor V Leiden, prothrombin gene mutation ([place]) 2006  . Hyperlipidemia   . Hypothyroidism   . Noncompliance   . Pelvic fracture ([place]) 2009  . Pulmonary embolism ([place]) 2006   Associated with deep vein thrombosis-2006; + factor V Leiden  . Tobacco abuse    50 pack years    Medications:  See med rec  Assessment: Patient presented to ED with dyspnea and fatigue. Patient is on chronic coumadin therapy for Factor V Leiden deficiency Pharmacy asked to dose Coumadin. INR is supratherapeutic this AM at 3.32 and trending down.  No bleeding reported. Anemia and transfused  Home dose of warfarin: 5 mg Mon, Wed, Friday and 6mg  all other days  Goal of Therapy:  INR 2-3 Monitor platelets by anticoagulation protocol: Yes   Plan:  No Coumadin today PT-INR daily Monitor for S/S of bleeding  Isac Sarna, BS [place], BCPS Clinical Pharmacist Pager [phone] 02/18/2018,8:08 AM

## 2018-02-18 NOTE — Plan of Care (Signed)

## 2018-02-18 NOTE — Telephone Encounter (Signed)
ATC Patient.  Unable to leave message, VM full. Will try again at a later time. 

## 2018-02-18 NOTE — Evaluation (Signed)
Physical Therapy Evaluation Patient Details Name: [name] MRN: [id number] DOB: 1950-04-22 Today's Date: 02/18/2018   History of Present Illness  [name] is a 68 y.o. female with medical history significant for chronic respiratory failure on 4 L O2 via [place] at home, COPD, CAD, hypothyroidism, and hypercoagulable state/factor V Leiden deficiency on chronic Coumadin who presents to the ED with complaint of progressive shortness of breath, fatigue, nonproductive cough, and difficulty caring for oneself.  She was recently admitted from 1/20-1/24/2020 for exacerbation of her COPD and discharged to [facility] rehab facility.  She says she was doing fairly well there while working with physical therapy.  She was discharged to home 3 days ago.  She says her home nebulizer machine was unplugged about 2 days ago from her chart and stripped and she was unable to plug herself.  She called EMS who did plug-in for her.  Her home PT came this morning and noticed she was doing unwell and therefore called EMS.    Clinical Impression  Patient demonstrates fair/good return for donning TLSO brace while seated at bedside, required extra time to properly fasten straps, limited for ambulation mostly due to c/o SOB/fatigue and tolerated sitting up in chair after therapy while on 3 LPM O2, patient resquested 4 LPM O2 when walking in room due to SOB on exertion. Patient will benefit from continued physical therapy in hospital and recommended venue below to increase strength, balance, endurance for safe ADLs and gait.    Follow Up Recommendations SNF    Equipment Recommendations  None recommended by PT    Recommendations for Other Services       Precautions / Restrictions Precautions Precautions: Fall Required Braces or Orthoses: Spinal Brace Spinal Brace: Thoracolumbosacral orthotic;Applied in sitting position Restrictions Weight Bearing Restrictions: No      Mobility  Bed Mobility Overal bed  mobility: Modified Independent Bed Mobility: Supine to Sit     Supine to sit: Modified independent (Device/Increase time)     General bed mobility comments: increased time  Transfers Overall transfer level: Needs assistance Equipment used: Rolling walker (2 wheeled) Transfers: Sit to/from [facility] Sit to Stand: Min guard Stand pivot transfers: Min guard       General transfer comment: labored movement, increased time  Ambulation/Gait Ambulation/Gait assistance: Min assist Gait Distance (Feet): 15 Feet Assistive device: Rolling walker (2 wheeled) Gait Pattern/deviations: Decreased step length - right;Decreased step length - left;Decreased stride length Gait velocity: decreased   General Gait Details: slow slightly labored cadence, no loss of balance, limited mostly due to SOB  Stairs            Wheelchair Mobility    Modified Rankin (Stroke Patients Only)       Balance Overall balance assessment: Needs assistance Sitting-balance support: Feet supported;No upper extremity supported Sitting balance-Leahy Scale: Good Sitting balance - Comments: able to donn TLSO brace while seated at bedside   Standing balance support: Bilateral upper extremity supported;During functional activity Standing balance-Leahy Scale: Fair Standing balance comment: using RW                             Pertinent Vitals/Pain Pain Assessment: Faces Faces Pain Scale: Hurts a little bit Pain Location: back with mobility Pain Descriptors / Indicators: Grimacing;Moaning;Guarding Pain Intervention(s): Monitored during session;Limited activity within patient's tolerance    Home Living Family/patient expects to be discharged to:: Private residence Living Arrangements: Alone Available Help at Discharge: Family;Available  PRN/intermittently Type of Home: House Home Access: Stairs to enter Entrance Stairs-Rails: Right;Left;Can reach both Entrance Stairs-Number  of Steps: 6 Home Layout: One level Home Equipment: Hand held shower head;Walker - 2 wheels;Cane - single point;Shower seat;Bedside commode      Prior Function Level of Independence: Independent with assistive device(s)         Comments: household ambulation without AD, uses [facility] for community. Still drives. Takes sponge bathes due to breathing purposes. She feels like she is drowning. She is physically able to get in and out of the tub/shower.     Hand Dominance   Dominant Hand: Right    Extremity/Trunk Assessment   Upper Extremity Assessment Upper Extremity Assessment: Defer to OT evaluation    Lower Extremity Assessment Lower Extremity Assessment: Generalized weakness    Cervical / Trunk Assessment Cervical / Trunk Assessment: Normal Cervical / Trunk Exceptions: L3 comp fx in TLSO  Communication   Communication: No difficulties  Cognition Arousal/Alertness: Awake/alert Behavior During Therapy: WFL for tasks assessed/performed Overall Cognitive Status: Within Functional Limits for tasks assessed                                        General Comments      Exercises     Assessment/Plan    PT Assessment Patient needs continued PT services  PT Problem List Decreased strength;Decreased activity tolerance;Decreased balance;Decreased mobility       PT Treatment Interventions Gait training;Functional mobility training;Therapeutic activities;Therapeutic exercise;Patient/family education;Stair training    PT Goals (Current goals can be found in the Care Plan section)  Acute Rehab PT Goals Patient Stated Goal: return home after rehab PT Goal Formulation: With patient Time For Goal Achievement: 03/04/18 Potential to Achieve Goals: Good    Frequency Min 3X/week   Barriers to discharge        Co-evaluation PT/OT/SLP Co-Evaluation/Treatment: Yes Reason for Co-Treatment: Complexity of the patient's impairments (multi-system involvement);To address  functional/ADL transfers PT goals addressed during session: Mobility/safety with mobility;Balance;Proper use of DME OT goals addressed during session: ADL's and self-care;Proper use of Adaptive equipment and DME       AM-PAC PT "6 Clicks" Mobility  Outcome Measure Help needed turning from your back to your side while in a flat bed without using bedrails?: None Help needed moving from lying on your back to sitting on the side of a flat bed without using bedrails?: None Help needed moving to and from a bed to a chair (including a wheelchair)?: A Little Help needed standing up from a chair using your arms (e.g., wheelchair or bedside chair)?: A Little Help needed to walk in hospital room?: A Little Help needed climbing 3-5 steps with a railing? : A Little 6 Click Score: 20    End of Session Equipment Utilized During Treatment: Gait belt;Back brace;Oxygen Activity Tolerance: Patient tolerated treatment well;Patient limited by fatigue Patient left: in chair;with call bell/phone within reach Nurse Communication: Mobility status PT Visit Diagnosis: Unsteadiness on feet (R26.81);Other abnormalities of gait and mobility (R26.89);Muscle weakness (generalized) (M62.81)    Time: 1751-0258 PT Time Calculation (min) (ACUTE ONLY): 23 min   Charges:   PT Evaluation $PT Eval Moderate Complexity: 1 Mod          2:02 PM, 02/18/18 Lonell Grandchild, MPT Physical Therapist with [facility] 336 [phone] office [phone] mobile phone

## 2018-02-18 NOTE — Progress Notes (Signed)
PROGRESS NOTE  [name]  TXM:[id number]  DOB: March 31, 1950  DOA: 02/15/2018 PCP: [name], NP   Brief Admission Hx: 68 y.o. female with medical history significant for chronic respiratory failure on 4 L O2 via [place] at home, COPD, CAD, hypothyroidism, and hypercoagulable state/factor V Leiden deficiency on chronic Coumadin who presents to the ED with complaint of progressive shortness of breath, fatigue, nonproductive cough, and difficulty caring for oneself.   MDM/Assessment & Plan:   1. Acute on chronic respiratory failure with hypoxia / COPD Exacerbation -She is improving with intensive hospital treatments with IV steroids, antibiotics, scheduled nebs, and supportive therapy.Goal pulse ox 88-92%.  2. Tobacco abuse - Pt strongly advised to please stop all tobacco use. Nicotine patch ordered as needed for cravings.  3. History of VTE and Factor V Leiden - pt is chronically anticoagulated with warfarin. Appreciate pharmacy for assisting with management. PHARM D TO ADJUST WARFARIN DOSE AS NEEDED FOR GOAL INR 2-3.  4. CAD - cycled troponins, no s/s of ACS. Continue home medical management.  5. Traumatic and osteoporotic L3 fracture - Pt was recently seen by spine surgery at [facility] and surgery was not recommended. Pt to continue to wear TLSO brace, pain management and PT recommended SNF. She was sent to SNF just prior to this admission. Pt was supposed to have neurosurgery follow up with Adele Dan NP in 1 week but it does not appear that she has followed up. She will need to reschedule a follow up appt at discharge.     6. Pain management - Pt has duragesic patch and breakthrough pain managed with hydrocodone/APAP.  7. Hypothyroidism - resumed home levothyroxine.  8. Anemia - Hg 8.1 on admission, down to 6.6. No bleeding reported. Hemoccult stools. transfused 2 unit PRBC 2/9 and Hg up to 9.1.  If heme positive, will request GI consult.   Check anemia panel.   DVT  prophylaxis:warfarin  Code Status:full  Family Communication:patient  Disposition Plan:anticipating SNF at discharge  Consultants:  pharmacy  Procedures:  n/a  Antimicrobials:  N/a   Subjective: Pt feels weak but has been working with PT.  She remains SOB but getting closer to baseline.      Objective: Vitals:   02/17/18 2314 02/18/18 0201 02/18/18 0602 02/18/18 0756  BP: 128/60 132/62 128/66   Pulse: 92 74 71   Resp: 19 20 18    Temp: 98.3 F (36.8 C) 98.5 F (36.9 C) 97.9 F (36.6 C)   TempSrc: Oral Oral Oral   SpO2: 97% 97% 99% 95%  Weight:   80.8 kg   Height:        Intake/Output Summary (Last 24 hours) at 02/18/2018 1137 Last data filed at 02/18/2018 0900 Gross per 24 hour  Intake 1720 ml  Output 1600 ml  Net 120 ml   Filed Weights   02/16/18 0319 02/17/18 0643 02/18/18 0602  Weight: 78.6 kg 79.5 kg 80.8 kg   REVIEW OF SYSTEMS  As per history otherwise all reviewed and reported negative  Exam:  General exam:awake, alert, NAD.  Respiratory system:better air movement bilateral, shallow breaths bilateral.  bilateral exp wheezing, no rales.  Cardiovascular system:S1 &S2 heard, RRR. No JVD, murmurs, gallops, clicks or pedal edema. Gastrointestinal system:Abdomen is nondistended, soft and nontender. Normal bowel sounds heard. Central nervous system:Alert and oriented. No focal neurological deficits. Extremities: 2+ pitting edema bilateral LEs.   Data Reviewed: Basic Metabolic Panel: Recent Labs  Lab 02/15/18 1618 02/16/18 0704 02/18/18 0630  NA 138  138 138  K 3.7 4.2 4.5  CL 99 97* 98  CO2 32 33* 32  GLUCOSE 96 179* 146*  BUN 18 18 24*  CREATININE 0.75 0.90 0.78  CALCIUM 8.0* 8.1* 8.1*   Liver Function Tests: No results for input(s): AST, ALT, ALKPHOS, BILITOT, PROT, ALBUMIN in the last 168 hours. No results for input(s): LIPASE, AMYLASE in the last 168 hours. No results for input(s): AMMONIA in the last 168 hours. CBC: Recent  Labs  Lab 02/15/18 1618 02/16/18 0704 02/17/18 1140 02/18/18 0630  WBC 5.5 5.6 7.2 7.3  HGB 8.1* 7.6* 6.6* 9.1*  HCT 28.4* 26.7* 23.4* 29.7*  MCV 97.9 98.5 97.9 94.9  PLT 299 323 298 281   Cardiac Enzymes: Recent Labs  Lab 02/15/18 1701  TROPONINI <0.03   CBG (last 3)  No results for input(s): GLUCAP in the last 72 hours. No results found for this or any previous visit (from the past 240 hour(s)).   Studies: US Venous Img Lower Bilateral  Result Date: 02/17/2018 CLINICAL DATA:  Shortness of breath bilateral leg edema. EXAM: BILATERAL LOWER EXTREMITY VENOUS DOPPLER ULTRASOUND TECHNIQUE: Gray-scale sonography with graded compression, as well as color Doppler and duplex ultrasound were performed to evaluate the lower extremity deep venous systems from the level of the common femoral vein and including the common femoral, femoral, profunda femoral, popliteal and calf veins including the posterior tibial, peroneal and gastrocnemius veins when visible. The superficial great saphenous vein was also interrogated. Spectral Doppler was utilized to evaluate flow at rest and with distal augmentation maneuvers in the common femoral, femoral and popliteal veins. COMPARISON:  None. FINDINGS: RIGHT LOWER EXTREMITY Common Femoral Vein: No evidence of thrombus. Normal compressibility, respiratory phasicity and response to augmentation. Saphenofemoral Junction: No evidence of thrombus. Normal compressibility and flow on color Doppler imaging. Profunda Femoral Vein: No evidence of thrombus. Normal compressibility and flow on color Doppler imaging. Femoral Vein: No evidence of thrombus. Normal compressibility, respiratory phasicity and response to augmentation. Popliteal Vein: No evidence of thrombus. Normal compressibility, respiratory phasicity and response to augmentation. Calf Veins: No evidence of thrombus. Normal compressibility and flow on color Doppler imaging. Superficial Great Saphenous Vein: No  evidence of thrombus. Normal compressibility. Venous Reflux:  None. Other Findings:  Subcutaneous edema along the right ankle. LEFT LOWER EXTREMITY Common Femoral Vein: No evidence of thrombus. Normal compressibility, respiratory phasicity and response to augmentation. Saphenofemoral Junction: No evidence of thrombus. Normal compressibility and flow on color Doppler imaging. Profunda Femoral Vein: No evidence of thrombus. Normal compressibility and flow on color Doppler imaging. Femoral Vein: No evidence of thrombus. Normal compressibility, respiratory phasicity and response to augmentation. Popliteal Vein: No evidence of thrombus. Normal compressibility, respiratory phasicity and response to augmentation. Calf Veins: No evidence of thrombus. Normal compressibility and flow on color Doppler imaging. Superficial Great Saphenous Vein: No evidence of thrombus. Normal compressibility. Venous Reflux:  None. Other Findings:  Subcutaneous edema along the left ankle. IMPRESSION: 1. No findings of deep vein thrombosis of either lower extremity. 2. Bilateral subcutaneous edema along the ankles, right greater than left. Electronically Signed   By: Van Clines M.D.   On: 02/17/2018 10:43   Scheduled Meds: . sodium chloride   Intravenous Once  . arformoterol  15 mcg Nebulization BID  . baclofen  10 mg Oral TID  . buPROPion  100 mg Oral q morning - 10a  . fentaNYL  1 patch Transdermal Q72H  . ferrous sulfate  325 mg Oral Q breakfast  .  guaiFENesin  1,200 mg Oral BID  . ipratropium-albuterol  3 mL Nebulization Q6H  . levothyroxine  125 mcg Oral Q0600  . lidocaine  1 patch Transdermal Q24H  . methylPREDNISolone (SOLU-MEDROL) injection  60 mg Intravenous Q6H  . pantoprazole  40 mg Oral Daily  . polyethylene glycol  17 g Oral Daily  . senna-docusate  2 tablet Oral BID  . ticagrelor  60 mg Oral BID  . Warfarin - Pharmacist Dosing Inpatient   Does not apply q1800   Continuous Infusions:  Principal  Problem:   COPD exacerbation (HCC) Active Problems:   Hypothyroidism   CAD S/P percutaneous coronary angioplasty   Factor V Leiden, prothrombin gene mutation ([place])   Chronic anticoagulation   Chronic respiratory failure ([place])  Time spent:   Irwin Brakeman, MD [facility] 02/18/2018, 11:37 AM    LOS: 1 day  How to contact the [facility] Attending or Consulting provider [place] or covering provider during after hours [place], for this patient?  1. Check the care team in [facility] and look for a) attending/consulting TR[facility] provider listed and b) the [facility] team listed 2. Log into [email or web address] and use [place]'s universal password to access. If you do not have the password, please contact the hospital operator. 3. Locate the [facility] provider you are looking for under [facility] and page to a number that you can be directly reached. 4. If you still have difficulty reaching the provider, please page the [facility] (Director on Call) for the Hospitalists listed on amion for assistance.

## 2018-02-18 NOTE — NC FL2 (Signed)
[place] LEVEL OF CARE SCREENING TOOL     IDENTIFICATION  Patient Name: [name] Birthdate: 1950/08/10 Sex: female Admission Date (Current Location): 02/15/2018  [facility] and [place] Number:  [employer] and Address:  [place] [address], [place]      Provider Number: [id number]  Attending Physician Name and Address:  Murlean Iba, MD  Relative Name and Phone Number:       Current Level of Care: Hospital Recommended Level of Care: [place] Prior Approval Number:    Date Approved/Denied:   PASRR Number: [id number]  Discharge Plan: SNF    Current Diagnoses: Patient Active Problem List   Diagnosis Date Noted  . Acute on chronic respiratory failure with hypoxia and hypercapnia ([place]) 01/29/2018  . Acute and chronic respiratory failure (acute-on-chronic) ([place]) 01/29/2018  . Pressure injury of skin 01/29/2018  . Hyperglycemia 01/21/2018  . COPD with exacerbation ([place]) 01/21/2018  . Vertebral fracture, osteoporotic ([place]) 01/20/2018  . Rectal bleeding 09/05/2017  . Left lower quadrant pain 09/05/2017  . Oxygen dependent 09/05/2017  . Chronic back pain 05/16/2017  . COPD exacerbation ([place]) 02/19/2017  . Tachycardia 02/19/2017  . Influenza A 02/19/2017  . Acute respiratory failure ([place]) 02/19/2017  . Dyspnea 11/26/2014  . Loss of weight 11/26/2014  . Chronic respiratory failure ([place]) 08/27/2013  . Chest pain 08/24/2013  . Encounter for therapeutic drug monitoring 02/13/2013  . Abnormal weight loss 03/21/2012  . Cerebrovascular disease 11/15/2011  . CAD S/P percutaneous coronary angioplasty   . Factor V Leiden, prothrombin gene mutation ([place])   . Hyperlipidemia   . Chronic anticoagulation   . Hypothyroidism 11/23/2006  . TOBACCO ABUSE 11/23/2006  . History of pulmonary embolus (PE) 11/23/2006  . COPD (chronic obstructive pulmonary disease) with emphysema ([place]) 11/23/2006     Orientation RESPIRATION BLADDER Height & Weight     Self, Time, Situation, Place  O2(Nasal [place] 4L/M)   Weight: 80.8 kg Height:  5\' 4"  (162.6 cm)  BEHAVIORAL SYMPTOMS/MOOD NEUROLOGICAL BOWEL NUTRITION STATUS  (none)     Diet(Heart healthy)  AMBULATORY STATUS COMMUNICATION OF NEEDS Skin   Limited Assist   PU Stage and Appropriate Care(Buttocks) Stage 1                       Personal Care Assistance Level of Assistance  Bathing, Feeding, Dressing Bathing Assistance: Limited assistance Feeding assistance: Independent Dressing Assistance: Limited assistance     Functional Limitations Info  Sight, Hearing, Speech          SPECIAL CARE FACTORS FREQUENCY  PT (By licensed PT)     PT Frequency: 5X/W              Contractures Contractures Info: Not present    Additional Factors Info  Code Status, Allergies Code Status Info: Full Allergies Info: Dilaudid, Minocycline Hcl, Prednisone, Varenicline Tartrate, Zocor -High Dose           Current Medications (02/18/2018):  This is the current hospital active medication list Current Facility-Administered Medications  Medication Dose Route Frequency Provider Last Rate Last Dose  . 0.9 %  sodium chloride infusion (Manually program via Guardrails IV Fluids)   Intravenous Once Johnson, Clanford L, MD      . acetaminophen (TYLENOL) tablet 650 mg  650 mg Oral Q6H PRN Lenore Cordia, MD       Or  . acetaminophen (TYLENOL) suppository 650 mg  650 mg Rectal  Q6H PRN Zada Finders R, MD      . albuterol (PROVENTIL) (2.5 MG/3ML) 0.083% nebulizer solution 2.5 mg  2.5 mg Nebulization Q2H PRN Zada Finders R, MD   2.5 mg at 02/17/18 1732  . arformoterol (BROVANA) nebulizer solution 15 mcg  15 mcg Nebulization BID Lenore Cordia, MD   15 mcg at 02/18/18 0755  . baclofen (LIORESAL) tablet 10 mg  10 mg Oral TID Lenore Cordia, MD   10 mg at 02/18/18 1602  . benzonatate (TESSALON) capsule 100 mg  100 mg Oral BID PRN Lenore Cordia,  MD   100 mg at 02/16/18 1606  . buPROPion [facility] SR) 12 hr tablet 100 mg  100 mg Oral q morning - 10a Zada Finders R, MD   100 mg at 02/18/18 0915  . fentaNYL (DURAGESIC) 50 MCG/HR 1 patch  1 patch Transdermal Q72H Lenore Cordia, MD   1 patch at 02/16/18 2117  . ferrous sulfate tablet 325 mg  325 mg Oral Q breakfast Lenore Cordia, MD   325 mg at 02/18/18 0915  . guaiFENesin (MUCINEX) 12 hr tablet 1,200 mg  1,200 mg Oral BID Zada Finders R, MD   1,200 mg at 02/18/18 0915  . HYDROcodone-acetaminophen (NORCO/VICODIN) 5-325 MG per tablet 1 tablet  1 tablet Oral Q4H PRN Lenore Cordia, MD   1 tablet at 02/16/18 2103  . ipratropium-albuterol (DUONEB) 0.5-2.5 (3) MG/3ML nebulizer solution 3 mL  3 mL Nebulization Q6H Zada Finders R, MD   3 mL at 02/18/18 1336  . levothyroxine (SYNTHROID, LEVOTHROID) tablet 125 mcg  125 mcg Oral Q0600 Lenore Cordia, MD   125 mcg at 02/18/18 0603  . lidocaine (LIDODERM) 5 % 1 patch  1 patch Transdermal Q24H Lenore Cordia, MD   1 patch at 02/17/18 2017  . methylPREDNISolone sodium succinate (SOLU-MEDROL) 125 mg/2 mL injection 60 mg  60 mg Intravenous Q6H Zada Finders R, MD   60 mg at 02/18/18 1312  . ondansetron (ZOFRAN) tablet 4 mg  4 mg Oral Q6H PRN Lenore Cordia, MD       Or  . ondansetron (ZOFRAN) injection 4 mg  4 mg Intravenous Q6H PRN Zada Finders R, MD      . pantoprazole (PROTONIX) EC tablet 40 mg  40 mg Oral Daily Johnson, Clanford L, MD   40 mg at 02/18/18 0915  . polyethylene glycol (MIRALAX / GLYCOLAX) packet 17 g  17 g Oral Daily Johnson, Clanford L, MD   17 g at 02/18/18 0915  . senna-docusate (Senokot-S) tablet 2 tablet  2 tablet Oral BID Lenore Cordia, MD   2 tablet at 02/18/18 0915  . ticagrelor (BRILINTA) tablet 60 mg  60 mg Oral BID Lenore Cordia, MD   60 mg at 02/18/18 0915  . traZODone (DESYREL) tablet 50 mg  50 mg Oral QHS PRN Lenore Cordia, MD   50 mg at 02/18/18 0203  . Warfarin - Pharmacist Dosing Inpatient   Does not apply  [id number] Lenore Cordia, MD         Discharge Medications: Please see discharge summary for a list of discharge medications.  Relevant Imaging Results:  Relevant Lab Results:   Additional Information SSN: [id number]  Trish Mage, LCSW

## 2018-02-18 NOTE — Evaluation (Signed)
Occupational Therapy Evaluation Patient Details Name: [name] MRN: [id number] DOB: 05-28-1950 Today's Date: 02/18/2018    History of Present Illness [name] is a 68 y.o. female with medical history significant for chronic respiratory failure on 4 L O2 via [place] at home, COPD, CAD, hypothyroidism, and hypercoagulable state/factor V Leiden deficiency on chronic Coumadin who presents to the ED with complaint of progressive shortness of breath, fatigue, nonproductive cough, and difficulty caring for oneself.  She was recently admitted from 1/20-1/24/2020 for exacerbation of her COPD and discharged to [facility] rehab facility.  She says she was doing fairly well there while working with physical therapy.  She was discharged to home 3 days ago.  She says her home nebulizer machine was unplugged about 2 days ago from her chart and stripped and she was unable to plug herself.  She called EMS who did plug-in for her.  Her home PT came this morning and noticed she was doing unwell and therefore called EMS.   Clinical Impression   Pt in bed and agreeable to participate in OT evaluation. Patient presents with fatigue and decreased activity tolerance resulting in shortness of breathe and difficulty completing basic ADL tasks. Patient will benefit from D/C to SNF for short term rehab to increase activity tolerance and be able to complete ADL tasks with less fatigue.     Follow Up Recommendations  SNF;Supervision/Assistance - 24 hour    Equipment Recommendations  None recommended by OT       Precautions / Restrictions Precautions Precautions: Fall Required Braces or Orthoses: Spinal Brace Spinal Brace: Thoracolumbosacral orthotic;Applied in sitting position Restrictions Weight Bearing Restrictions: No      Mobility Bed Mobility Overal bed mobility: Modified Independent Bed Mobility: Supine to Sit     Supine to sit: Modified independent (Device/Increase time)         Transfers Overall transfer level: Needs assistance Equipment used: Rolling walker (2 wheeled) Transfers: Sit to/from [employer] Sit to Stand: Min guard Stand pivot transfers: Min guard                ADL either performed or assessed with clinical judgement   ADL   Eating/Feeding: Modified independent;Sitting               Upper Body Dressing : Supervision/safety;Sitting Upper Body Dressing Details (indicate cue type and reason): Pt was able to donn TLSO in witting with supervision.     Toilet Transfer: Min guard;Ambulation;RW                   Vision Baseline Vision/History: Wears glasses Patient Visual Report: No change from baseline Vision Assessment?: No apparent visual deficits            Pertinent Vitals/Pain Pain Assessment: Faces Faces Pain Scale: Hurts a little bit Pain Location: back with mobility Pain Descriptors / Indicators: Grimacing;Moaning;Guarding Pain Intervention(s): Monitored during session     Hand Dominance Right   Extremity/Trunk Assessment Upper Extremity Assessment Upper Extremity Assessment: Overall WFL for tasks assessed   Lower Extremity Assessment Lower Extremity Assessment: Defer to PT evaluation       Communication Communication Communication: No difficulties   Cognition Arousal/Alertness: Awake/alert Behavior During Therapy: WFL for tasks assessed/performed Overall Cognitive Status: Within Functional Limits for tasks assessed                  Home Living Family/patient expects to be discharged to:: Skilled nursing facility Living Arrangements: Alone Available Help at Discharge: Family;Available  PRN/intermittently Type of Home: House Home Access: Stairs to enter [employer] of Steps: 6 Entrance Stairs-Rails: Right;Left;Can reach both(uses the right side going up) Home Layout: One level     Bathroom Shower/Tub: Teacher, early years/pre: Standard Bathroom  Accessibility: Yes   Home Equipment: Hand held shower head;Walker - 2 wheels;Cane - single point;Shower seat;Bedside commode          Prior Functioning/Environment Level of Independence: Independent with assistive device(s)        Comments: household ambulation without AD, uses [facility] for community. Still drives. Takes sponge bathes due to breathing purposes. She feels like she is drowning. She is physically able to get in and out of the tub/shower.        OT Problem List: Decreased strength;Decreased range of motion;Decreased activity tolerance;Impaired balance (sitting and/or standing);Decreased knowledge of use of DME or AE;Decreased knowledge of precautions;Pain;Decreased cognition               Barriers to D/C: Decreased caregiver support          Co-evaluation PT/OT/SLP Co-Evaluation/Treatment: Yes Reason for Co-Treatment: To address functional/ADL transfers   OT goals addressed during session: ADL's and self-care;Proper use of Adaptive equipment and DME      AM-PAC OT "6 Clicks" Daily Activity     Outcome Measure Help from another person eating meals?: None Help from another person taking care of personal grooming?: None Help from another person toileting, which includes using toliet, bedpan, or urinal?: A Little Help from another person bathing (including washing, rinsing, drying)?: A Little Help from another person to put on and taking off regular upper body clothing?: None Help from another person to put on and taking off regular lower body clothing?: A Little 6 Click Score: 21   End of Session Equipment Utilized During Treatment: Gait belt;Rolling walker;Back brace;Oxygen  Activity Tolerance: Patient tolerated treatment well Patient left: in chair;with call bell/phone within reach;with chair alarm set  OT Visit Diagnosis: Muscle weakness (generalized) (M62.81)                Time: 2563-8937 OT Time Calculation (min): 28 min Charges:  OT General  Charges $OT Visit: 1 Visit OT Evaluation $OT Eval Low Complexity: [place], OTR/L,CBIS  [phone]   Birdie Fetty, Clarene Duke 02/18/2018, 12:30 PM

## 2018-02-18 NOTE — Plan of Care (Signed)
  Problem: Acute Rehab PT Goals(only PT should resolve) Goal: Patient Will Transfer Sit To/From Stand Flowsheets (Taken 02/18/2018 1406) Patient will transfer sit to/from stand: with supervision Goal: Pt Will Transfer Bed To Chair/Chair To Bed Flowsheets (Taken 02/18/2018 1406) Pt will Transfer Bed to Chair/Chair to Bed: with supervision Goal: Pt Will Ambulate Flowsheets (Taken 02/18/2018 1406) Pt will Ambulate: 50 feet; with rolling walker; with supervision   2:06 PM, 02/18/18 Lonell Grandchild, MPT Physical Therapist with [facility] [phone] office [phone] mobile phone

## 2018-02-19 ENCOUNTER — Encounter: Payer: Self-pay | Admitting: Physician Assistant

## 2018-02-19 LAB — CBC
HCT: 32.4 % — ABNORMAL LOW (ref 36.0–46.0)
Hemoglobin: 9.8 g/dL — ABNORMAL LOW (ref 12.0–15.0)
MCH: 29.2 pg (ref 26.0–34.0)
MCHC: 30.2 g/dL (ref 30.0–36.0)
MCV: 96.4 fL (ref 80.0–100.0)
NRBC: 0.4 % — AB (ref 0.0–0.2)
Platelets: 300 10*3/uL (ref 150–400)
RBC: 3.36 MIL/uL — ABNORMAL LOW (ref 3.87–5.11)
RDW: 16.7 % — ABNORMAL HIGH (ref 11.5–15.5)
WBC: 7.1 10*3/uL (ref 4.0–10.5)

## 2018-02-19 LAB — PROTIME-INR
INR: 2.33
Prothrombin Time: 25.3 seconds — ABNORMAL HIGH (ref 11.4–15.2)

## 2018-02-19 MED ORDER — PANTOPRAZOLE SODIUM 40 MG PO TBEC: 40.0000 mg | DELAYED_RELEASE_TABLET | Freq: Every day | ORAL | Status: DC

## 2018-02-19 MED ORDER — PREDNISONE 10 MG PO TABS: ORAL_TABLET | ORAL | Status: DC

## 2018-02-19 MED ORDER — WARFARIN SODIUM 5 MG PO TABS
5.0000 mg | ORAL_TABLET | Freq: Once | ORAL | Status: AC
  Administered 2018-02-19: 5 mg via ORAL
  Filled 2018-02-19: qty 1

## 2018-02-19 MED ORDER — FENTANYL 50 MCG/HR TD PT72: 1.0000 | MEDICATED_PATCH | TRANSDERMAL | 0 refills | Status: DC

## 2018-02-19 MED ORDER — HYDROCODONE-ACETAMINOPHEN 5-325 MG PO TABS: 1.0000 | ORAL_TABLET | Freq: Four times a day (QID) | ORAL | 0 refills | Status: DC | PRN

## 2018-02-19 MED ORDER — GUAIFENESIN ER 600 MG PO TB12: 1200.0000 mg | ORAL_TABLET | Freq: Two times a day (BID) | ORAL | 0 refills | Status: AC

## 2018-02-19 NOTE — Progress Notes (Signed)
Physical Therapy Treatment Patient Details Name: [name] MRN: [id number] DOB: 1950-03-29 Today's Date: 02/19/2018    History of Present Illness [name] is a 68 y.o. female with medical history significant for chronic respiratory failure on 4 L O2 via [place] at home, COPD, CAD, hypothyroidism, and hypercoagulable state/factor V Leiden deficiency on chronic Coumadin who presents to the ED with complaint of progressive shortness of breath, fatigue, nonproductive cough, and difficulty caring for oneself.  She was recently admitted from 1/20-1/24/2020 for exacerbation of her COPD and discharged to [facility] rehab facility.  She says she was doing fairly well there while working with physical therapy.  She was discharged to home 3 days ago.  She says her home nebulizer machine was unplugged about 2 days ago from her chart and stripped and she was unable to plug herself.  She called EMS who did plug-in for her.  Her home PT came this morning and noticed she was doing unwell and therefore called EMS.    PT Comments    Pt with multiple complaints when therapist entered room.  Pt is normally on O2 24/7.  Pt able to complete bed mobility, donning of TLSO and ambulation with mod I.  Pt is near prior level of care, due to anxiety may need 24 hr supervision.   Follow Up Recommendations  SNF;Home health PT     Equipment Recommendations  None recommended by PT    Recommendations for Other Services       Precautions / Restrictions Precautions Precautions: None Spinal Brace: Thoracolumbosacral orthotic Restrictions Weight Bearing Restrictions: No    Mobility  Bed Mobility Overal bed mobility: Modified Independent             General bed mobility comments: increased time  Transfers Overall transfer level: Modified independent Equipment used: Rolling walker (2 wheeled) Transfers: Sit to/from [employer] transfer comment:  increased  time  Ambulation/Gait Ambulation/Gait assistance: Modified independent (Device/Increase time) Gait Distance (Feet): 180 Feet Assistive device: Rolling walker (2 wheeled) Gait Pattern/deviations: Decreased step length - right;Decreased step length - left;Decreased stride length Gait velocity: decreased   General Gait Details: , limited mostly due to SOB             Cognition Arousal/Alertness: Awake/alert Behavior During Therapy: WFL for tasks assessed/performed Overall Cognitive Status: Within Functional Limits for tasks assessed                                               Pertinent Vitals/Pain  none vocalized     Home Living  lives alone                    Prior Function  uses O2 24/7         PT Goals (current goals can now be found in the care plan section) Acute Rehab PT Goals PT Goal Formulation: With patient Time For Goal Achievement: 03/04/18 Potential to Achieve Goals: Good Progress towards PT goals: Progressing toward goals    Frequency    Min 3X/week      PT Plan  Pt is close to prior level of functioning.  Pt may need 24 hr supervision based on anxiety but physically is close to prior level. 6 clicks score 19       AM-PAC  PT "6 Clicks" Mobility   Outcome Measure  Help needed turning from your back to your side while in a flat bed without using bedrails?: None Help needed moving from lying on your back to sitting on the side of a flat bed without using bedrails?: A Little Help needed moving to and from a bed to a chair (including a wheelchair)?: A Little Help needed standing up from a chair using your arms (e.g., wheelchair or bedside chair)?: A Little Help needed to walk in hospital room?: A Little Help needed climbing 3-5 steps with a railing? : A Little 6 Click Score: 19    End of Session Equipment Utilized During Treatment: Gait belt;Back brace;Oxygen Activity Tolerance: Patient tolerated treatment  well;Patient limited by fatigue Patient left: in chair;with call bell/phone within reach Nurse Communication: Mobility status PT Visit Diagnosis: Other abnormalities of gait and mobility (R26.89)     Time: 5035-4656 PT Time Calculation (min) (ACUTE ONLY): 30 min  Charges:  $Gait Training: 8-22 mins                        Rayetta Humphrey, PT CLT [phone] 02/19/2018, 9:45 AM

## 2018-02-19 NOTE — Plan of Care (Signed)

## 2018-02-19 NOTE — Progress Notes (Signed)
ANTICOAGULATION CONSULT NOTE -follow up [facility] for Coumadin Indication: Factor V deficiency(hypercoaguable state)  Allergies  Allergen Reactions  . Dilaudid [Hydromorphone Hcl] Hives and Nausea Only  . Minocycline Hcl     REACTION: Dizzy  . Prednisone     REACTION: feels like throat swelling, hallucinations  . Varenicline Tartrate     REACTION: Dizzy(chantix)   . Zocor [Simvastatin - High Dose] Other (See Comments)    myalgia    Patient Measurements: Height: 5\' 4"  (162.6 cm) Weight: 176 lb 5.9 oz (80 kg) IBW/kg (Calculated) : 54.7  Vital Signs: Temp: 98.3 F (36.8 C) (02/11 0637) Temp Source: Oral (02/11 0637) BP: 123/60 (02/11 0637) Pulse Rate: 72 (02/11 0637)  Labs: Recent Labs    02/17/18 0446  02/17/18 1140 02/18/18 0630 02/19/18 0512  HGB  --    < > 6.6* 9.1* 9.8*  HCT  --   --  23.4* 29.7* 32.4*  PLT  --   --  298 281 300  LABPROT 42.3*  --   --  33.2* 25.3*  INR 4.53*  --   --  3.32 2.33  CREATININE  --   --   --  0.78  --    < > = values in this interval not displayed.    Estimated Creatinine Clearance: 69.8 mL/min (by C-G formula based on SCr of 0.78 mg/dL).   Medical History: Past Medical History:  Diagnosis Date  . Acute MI ([place])    x4, code blue x3  . Arteriosclerotic cardiovascular disease (ASCVD) 2002   Inf STEMI-2002. 2003-cutting balloon + brachytherapy for restenosis; subsequent acute stent thrombosis 06/2010 requiring 2 separate interventions (Zeta stent, then repeat cath with thrombectomy). focal basal inf AK, nl EF; 03/2011: Patent stents, minor nonobst  residual dz, nl EF; neg stress nuclear in 2008 and stress echo in 2009  . Chronic anticoagulation    Warfarin plus ticagrelor  . Chronic respiratory failure ([place]) 08/27/2013   On 2L 02  . COPD (chronic obstructive pulmonary disease) ([place])    02 dependent  . COPD (chronic obstructive pulmonary disease) ([place])   . Factor 5 Leiden mutation, heterozygous ([place])   . Factor  V Leiden, prothrombin gene mutation ([place]) 2006  . Hyperlipidemia   . Hypothyroidism   . Noncompliance   . Pelvic fracture ([place]) 2009  . Pulmonary embolism ([place]) 2006   Associated with deep vein thrombosis-2006; + factor V Leiden  . Tobacco abuse    50 pack years    Medications:  See med rec  Assessment: Patient presented to ED with dyspnea and fatigue. Patient is on chronic coumadin therapy for Factor V Leiden deficiency Pharmacy asked to dose Coumadin. INR Is therapeutic at 2.33.  No bleeding reported. Anemia and transfused  Home dose of warfarin: 5 mg Mon, Wed, Friday and 6mg  all other days  Goal of Therapy:  INR 2-3 Monitor platelets by anticoagulation protocol: Yes   Plan:  Warfarin 5 mg x 1 dose PT-INR daily Monitor for S/S of bleeding  Margot Ables, PharmD Clinical Pharmacist 02/19/2018 9:08 AM

## 2018-02-19 NOTE — Discharge Summary (Addendum)
Physician Discharge Summary  [name] YHC:[id number] DOB: 08-14-1950 DOA: 02/15/2018  PCP: [name] Peng, NP Neurosurgery:  Glenford Peers, NP  Admit date: 02/15/2018 Discharge date: 02/19/2018  Admitted From: SNF  Disposition: SNF   Recommendations for Outpatient Follow-up:  1. Follow up with PCP in 2 weeks  2. Follow up with neurosurgery in 1 week 3. Please monitor PT/INR every 3 days with goal INR 2-3.  Adjust warfarin dose if necessary.  4. Please wear TLSO Brace with ambulating and sitting up in chair until follows up with neurosurgery.  5. Pt wears continuous oxygen 3-4L/min at all times.   Discharge Condition: STABLE   CODE STATUS: FULL     Brief Hospitalization Summary: Please see all hospital notes, images, labs for full details of the hospitalization. Dr. Serita [name] DVV:OHYWVP Mehl is a 68 y.o. female with medical history significant for chronic respiratory failure on 4 L O2 via [place] at home, COPD, CAD, hypothyroidism, and hypercoagulable state/factor V Leiden deficiency on chronic Coumadin who presents to the ED with complaint of progressive shortness of breath, fatigue, nonproductive cough, and difficulty caring for oneself.  She was recently admitted from 1/20-1/24/2020 for exacerbation of her COPD and discharged to [facility] rehab facility.  She says she was doing fairly well there while working with physical therapy.  She was discharged to home 3 days ago.  She says her home nebulizer machine was unplugged about 2 days ago from her chart and stripped and she was unable to plug herself.  She called EMS who did plug-in for her.  Her home PT came this morning and noticed she was doing unwell and therefore called EMS.  Per ED documentation, her O2 sat was 88% on room air on EMS arrival.  She denies any chest pain, fevers, diaphoresis, abdominal pain, diarrhea, dysuria, or any obvious bleeding.  ED Course:  Initial vitals showed BP 104/54, pulse 89, RR 16, temp 98.7  Fahrenheit, SPO2 100% on 2 L O2 via [place].  Labs are notable for WBC 5.5, hemoglobin 8.1, platelets 299, troponin I <0.03, INR 2.05.  Chest x-ray showed chronic changes of interstitial lung disease similar to prior without focal consolidation.  Patient was given albuterol and DuoNeb treatment in the ED and IV magnesium.  The hospitalist service was consulted to admit for further evaluation and management.  Brief Admission Hx: 68 y.o.femalewith medical history significant forchronic respiratory failure on 4 L O2 via [place] at home, COPD, CAD, hypothyroidism, and hypercoagulable state/factor V Leiden deficiency on chronic warfarin who presents to the [facility] complaint of progressive shortness of breath, fatigue, nonproductive cough, and difficulty caring for oneself.   MDM/Assessment & Plan:   1. Acute on chronic respiratory failure with hypoxia / COPD Exacerbation -She is improved after intensive hospital treatments with IV steroids, antibiotics, scheduled nebs, and supportive therapy.Goalpulse ox88-92%. 2. Tobacco abuse - Pt strongly advised to please stop all tobacco use. Nicotine patch ordered as needed for cravings in hospital..  3. History of VTE and Factor V Leiden - pt is chronically anticoagulated with warfarin. Appreciate pharmacy for assisting with management.MONITOR PT/INR EVERY 3 DAYS AND ADJUST WARFARIN DOSE AS NEEDED FOR GOAL INR 2-3. 4. CAD - cycledtroponins, no s/s of ACS. Continue home medical management.  5. Traumatic and osteoporotic L3 fracture - Pt was recently seen by spine surgery at [facility] and surgery was not recommended. Ptto continue to wearTLSO brace, pain management and PT recommended SNF. She was sent to SNF just prior to this admission. Pt was  supposed to have neurosurgery follow up with Adele Dan NP in 1 week. She will need to reschedule a follow up appt at discharge.     6. Pain management - Pt has duragesic patchand breakthrough pain  managed with hydrocodone/APAP. 7. Hypothyroidism - resumed home levothyroxine.  8. Anemia - Hg 8.1 on admission, down to 6.6. No bleeding reported. Hemoccult stools. transfused 2 unit PRBC 2/9 and Hg up to 9.1.  repeat Hg is 9.8.   DVT prophylaxis:warfarin  Code Status:full  Family Communication:patient  Disposition Plan:SNF  Consultants:  pharmacy  Procedures:  n/a  Antimicrobials:  N/a   Discharge Diagnoses:  Principal Problem:   COPD exacerbation ([place]) Active Problems:   Hypothyroidism   CAD S/P percutaneous coronary angioplasty   Factor V Leiden, prothrombin gene mutation ([place])   Chronic anticoagulation   Chronic respiratory failure [facility])  Discharge Instructions: Discharge Instructions    Call MD for:  difficulty breathing, headache or visual disturbances   Complete by:  As directed    Call MD for:  extreme fatigue   Complete by:  As directed    Call MD for:  persistant dizziness or light-headedness   Complete by:  As directed    Call MD for:  severe uncontrolled pain   Complete by:  As directed    Increase activity slowly   Complete by:  As directed      Allergies as of 02/21/2018      Reactions   Dilaudid [hydromorphone Hcl] Hives, Nausea Only   Minocycline Hcl    REACTION: Dizzy   Prednisone    REACTION: feels like throat swelling, hallucinations   Varenicline Tartrate    REACTION: Dizzy(chantix)   Zocor [simvastatin - High Dose] Other (See Comments)   myalgia      Medication List    STOP taking these medications   benzonatate 100 MG capsule Commonly known as:  TESSALON     TAKE these medications   albuterol (2.5 MG/3ML) 0.083% nebulizer solution Commonly known as:  PROVENTIL USE 1 VIAL BY NEBULIZER EVERY 4 HOURS AS NEEDED FOR WHEEZING. DX: J44.9 What changed:    how much to take  how to take this  when to take this  reasons to take this  additional instructions   arformoterol 15 MCG/2ML Nebu Commonly known as:   BROVANA Take 2 mLs (15 mcg total) by nebulization 2 (two) times daily.   baclofen 10 MG tablet Commonly known as:  LIORESAL Take 1 tablet (10 mg total) by mouth 3 (three) times daily.   budesonide 0.5 MG/2ML nebulizer solution Commonly known as:  PULMICORT Take 2 mLs (0.5 mg total) by nebulization 2 (two) times daily. Dx: J43.9   buPROPion 100 MG 12 hr tablet Commonly known as:  WELLBUTRIN SR Take 100 mg by mouth every morning.   fentaNYL 50 MCG/HR Commonly known as:  [place] 1 patch onto the skin every 3 (three) days.   ferrous sulfate 325 (65 FE) MG tablet Take 325 mg by mouth daily with breakfast. What changed:  Another medication with the same name was added. Make sure you understand how and when to take each.   ferrous sulfate 324 (65 Fe) MG Tbec Take 1 tablet (325 mg total) by mouth daily with breakfast. What changed:  You were already taking a medication with the same name, and this prescription was added. Make sure you understand how and when to take each.   Fish Oil 600 MG Caps Take 1 capsule by  mouth daily.   FLUTTER Devi Use as directed   folic acid 1 MG tablet Commonly known as:  FOLVITE Take 1 tablet (1 mg total) by mouth daily.   guaiFENesin 600 MG 12 hr tablet Commonly known as:  MUCINEX Take 2 tablets (1,200 mg total) by mouth 2 (two) times daily for 5 days.   HYDROcodone-acetaminophen 5-325 MG tablet Commonly known as:  NORCO/VICODIN Take 1 tablet by mouth every 6 (six) hours as needed (BREAKTHROUGH PAIN). What changed:    when to take this  reasons to take this   ipratropium-albuterol 0.5-2.5 (3) MG/3ML Soln Commonly known as:  DUONEB Take 3 mLs by nebulization 4 (four) times daily.   levothyroxine 125 MCG tablet Commonly known as:  SYNTHROID, LEVOTHROID Take 1 tablet (125 mcg total) by mouth daily.   lidocaine 5 % Commonly known as:  LIDODERM Place 1 patch onto the skin daily. Remove & Discard patch within 12 hours or as directed  by MD   nitroGLYCERIN 0.4 MG SL tablet Commonly known as:  NITROSTAT Place 1 tablet (0.4 mg total) under the tongue every 5 (five) minutes as needed for chest pain.   OXYGEN Inhale 3-4 L into the lungs continuous. 3L continuous and 4L with exertion   pantoprazole 40 MG tablet Commonly known as:  PROTONIX Take 1 tablet (40 mg total) by mouth daily.   polyethylene glycol packet Commonly known as:  MIRALAX / GLYCOLAX Take 17 g by mouth daily.   predniSONE 10 MG tablet Commonly known as:  DELTASONE Take 6 PO QAM x5days, 4 PO QAM x3days, 4 PO QAM x3days, 2 PO QAM x 3 days, 1 PO QD x 3 days   ticagrelor 60 MG Tabs tablet Commonly known as:  BRILINTA Take 1 tablet (60 mg total) by mouth 2 (two) times daily.   traZODone 50 MG tablet Commonly known as:  DESYREL Take 50 mg by mouth at bedtime as needed for sleep.   warfarin 1 MG tablet Commonly known as:  COUMADIN Take as directed. If you are unsure how to take this medication, talk to your nurse or doctor. Original instructions:  Take 1 mg by mouth See admin instructions. Alternates taking 6mg  with 5mg  daily. Takes 5mg  on Mondays, Wednesdays, and Fridays. Takes 6mg  on all other days   warfarin 5 MG tablet Commonly known as:  COUMADIN Take as directed. If you are unsure how to take this medication, talk to your nurse or doctor. Original instructions:  Take 5 mg by mouth See admin instructions. Alternates taking 6mg  with 5mg  daily. Takes 5mg  on Mondays, Wednesdays, and Fridays. Takes 6mg  on all other days            Durable Medical Equipment  (From admission, onward)         Start     Ordered   02/21/18 1050  For home use only DME 4 wheeled rolling walker with seat  Once    Question:  Patient needs a walker to treat with the following condition  Answer:  COPD (chronic obstructive pulmonary disease) ([place])   02/21/18 1050         Follow-up Information    Meyran, Ocie Cornfield, NP. Schedule an appointment as soon as  possible for a visit in 1 week(s).   Specialty:  Neurosurgery Why:  Follow Up  Contact information: [address] [place] [address] [phone]        [name] Peng, NP. Schedule an appointment as soon as possible  for a visit in 2 week(s).   Specialty:  Family Medicine Why:  Hospital Follow Up  Contact information: [place] [address] [phone]        Herminio Commons, MD. Schedule an appointment as soon as possible for a visit in 3 week(s).   Specialty:  Cardiology Contact information: [place] [address] [phone]          Allergies  Allergen Reactions  . Dilaudid [Hydromorphone Hcl] Hives and Nausea Only  . Minocycline Hcl     REACTION: Dizzy  . Prednisone     REACTION: feels like throat swelling, hallucinations  . Varenicline Tartrate     REACTION: Dizzy(chantix)   . Zocor [Simvastatin - High Dose] Other (See Comments)    myalgia   Allergies as of 02/21/2018      Reactions   Dilaudid [hydromorphone Hcl] Hives, Nausea Only   Minocycline Hcl    REACTION: Dizzy   Prednisone    REACTION: feels like throat swelling, hallucinations   Varenicline Tartrate    REACTION: Dizzy(chantix)   Zocor [simvastatin - High Dose] Other (See Comments)   myalgia      Medication List    STOP taking these medications   benzonatate 100 MG capsule Commonly known as:  TESSALON     TAKE these medications   albuterol (2.5 MG/3ML) 0.083% nebulizer solution Commonly known as:  PROVENTIL USE 1 VIAL BY NEBULIZER EVERY 4 HOURS AS NEEDED FOR WHEEZING. DX: J44.9 What changed:    how much to take  how to take this  when to take this  reasons to take this  additional instructions   arformoterol 15 MCG/2ML Nebu Commonly known as:  BROVANA Take 2 mLs (15 mcg total) by nebulization 2 (two) times daily.   baclofen 10 MG tablet Commonly known as:  LIORESAL Take 1 tablet (10 mg total) by mouth 3 (three)  times daily.   budesonide 0.5 MG/2ML nebulizer solution Commonly known as:  PULMICORT Take 2 mLs (0.5 mg total) by nebulization 2 (two) times daily. Dx: J43.9   buPROPion 100 MG 12 hr tablet Commonly known as:  WELLBUTRIN SR Take 100 mg by mouth every morning.   fentaNYL 50 MCG/HR Commonly known as:  [place] 1 patch onto the skin every 3 (three) days.   ferrous sulfate 325 (65 FE) MG tablet Take 325 mg by mouth daily with breakfast. What changed:  Another medication with the same name was added. Make sure you understand how and when to take each.   ferrous sulfate 324 (65 Fe) MG Tbec Take 1 tablet (325 mg total) by mouth daily with breakfast. What changed:  You were already taking a medication with the same name, and this prescription was added. Make sure you understand how and when to take each.   Fish Oil 600 MG Caps Take 1 capsule by mouth daily.   FLUTTER Devi Use as directed   folic acid 1 MG tablet Commonly known as:  FOLVITE Take 1 tablet (1 mg total) by mouth daily.   guaiFENesin 600 MG 12 hr tablet Commonly known as:  MUCINEX Take 2 tablets (1,200 mg total) by mouth 2 (two) times daily for 5 days.   HYDROcodone-acetaminophen 5-325 MG tablet Commonly known as:  NORCO/VICODIN Take 1 tablet by mouth every 6 (six) hours as needed (BREAKTHROUGH PAIN). What changed:    when to take this  reasons to take this   ipratropium-albuterol 0.5-2.5 (3) MG/3ML Soln  Commonly known as:  DUONEB Take 3 mLs by nebulization 4 (four) times daily.   levothyroxine 125 MCG tablet Commonly known as:  SYNTHROID, LEVOTHROID Take 1 tablet (125 mcg total) by mouth daily.   lidocaine 5 % Commonly known as:  LIDODERM Place 1 patch onto the skin daily. Remove & Discard patch within 12 hours or as directed by MD   nitroGLYCERIN 0.4 MG SL tablet Commonly known as:  NITROSTAT Place 1 tablet (0.4 mg total) under the tongue every 5 (five) minutes as needed for chest pain.    OXYGEN Inhale 3-4 L into the lungs continuous. 3L continuous and 4L with exertion   pantoprazole 40 MG tablet Commonly known as:  PROTONIX Take 1 tablet (40 mg total) by mouth daily.   polyethylene glycol packet Commonly known as:  MIRALAX / GLYCOLAX Take 17 g by mouth daily.   predniSONE 10 MG tablet Commonly known as:  DELTASONE Take 6 PO QAM x5days, 4 PO QAM x3days, 4 PO QAM x3days, 2 PO QAM x 3 days, 1 PO QD x 3 days   ticagrelor 60 MG Tabs tablet Commonly known as:  BRILINTA Take 1 tablet (60 mg total) by mouth 2 (two) times daily.   traZODone 50 MG tablet Commonly known as:  DESYREL Take 50 mg by mouth at bedtime as needed for sleep.   warfarin 1 MG tablet Commonly known as:  COUMADIN Take as directed. If you are unsure how to take this medication, talk to your nurse or doctor. Original instructions:  Take 1 mg by mouth See admin instructions. Alternates taking 6mg  with 5mg  daily. Takes 5mg  on Mondays, Wednesdays, and Fridays. Takes 6mg  on all other days   warfarin 5 MG tablet Commonly known as:  COUMADIN Take as directed. If you are unsure how to take this medication, talk to your nurse or doctor. Original instructions:  Take 5 mg by mouth See admin instructions. Alternates taking 6mg  with 5mg  daily. Takes 5mg  on Mondays, Wednesdays, and Fridays. Takes 6mg  on all other days            Durable Medical Equipment  (From admission, onward)         Start     Ordered   02/21/18 1050  For home use only DME 4 wheeled rolling walker with seat  Once    Question:  Patient needs a walker to treat with the following condition  Answer:  COPD (chronic obstructive pulmonary disease) ([place])   02/21/18 1050          Procedures/Studies: Dg Chest 2 View  Result Date: 02/15/2018 CLINICAL DATA:  Shortness of breath. EXAM: CHEST - 2 VIEW COMPARISON:  02/02/2018.  01/28/2018.  08/27/2013.  CT 11/14/2017. FINDINGS: Mediastinum hilar structures normal. Stable cardiomegaly with  normal pulmonary vascularity. Chronic interstitial changes consistent chronic interstitial lung disease and bilateral pleural-parenchymal thickening consistent scarring again noted. Similar findings noted on prior exams. Superimposed active interstitial lung disease can not be completely excluded. No pneumothorax. IMPRESSION: 1. Chronic interstitial lung disease again noted. Similar findings noted on multiple prior exams. Superimposed acute active interstitial disease can not be excluded. Bilateral pleural-parenchymal scarring. 2.  Stable cardiomegaly. Electronically Signed   By: Marcello Moores  Register   On: 02/15/2018 17:20   Dg Chest 2 View  Result Date: 01/28/2018 CLINICAL DATA:  Shortness of breath EXAM: CHEST - 2 VIEW COMPARISON:  01/20/2017, 09/19/2017, CT chest 11/14/2017 FINDINGS: CP angle blunting/scarring is unchanged. Posterior lower lung spiculation, corresponding to CT demonstrated scarring. Emphysematous  disease. Diffuse increased interstitial opacity right greater than left consistent with interstitial inflammatory process or edema. Stable cardiomediastinal silhouette. No pneumothorax. IMPRESSION: 1. Stable CP angle scarring and left lower lobe posterior pleural and parenchymal scarring 2. Emphysematous disease 3. Interval diffuse interstitial opacity right greater than left which may reflect interstitial inflammatory process or pulmonary edema Electronically Signed   By: Donavan Foil M.D.   On: 01/28/2018 23:04   US Venous Img Lower Bilateral  Result Date: 02/17/2018 CLINICAL DATA:  Shortness of breath bilateral leg edema. EXAM: BILATERAL LOWER EXTREMITY VENOUS DOPPLER ULTRASOUND TECHNIQUE: Gray-scale sonography with graded compression, as well as color Doppler and duplex ultrasound were performed to evaluate the lower extremity deep venous systems from the level of the common femoral vein and including the common femoral, femoral, profunda femoral, popliteal and calf veins including the posterior  tibial, peroneal and gastrocnemius veins when visible. The superficial great saphenous vein was also interrogated. Spectral Doppler was utilized to evaluate flow at rest and with distal augmentation maneuvers in the common femoral, femoral and popliteal veins. COMPARISON:  None. FINDINGS: RIGHT LOWER EXTREMITY Common Femoral Vein: No evidence of thrombus. Normal compressibility, respiratory phasicity and response to augmentation. Saphenofemoral Junction: No evidence of thrombus. Normal compressibility and flow on color Doppler imaging. Profunda Femoral Vein: No evidence of thrombus. Normal compressibility and flow on color Doppler imaging. Femoral Vein: No evidence of thrombus. Normal compressibility, respiratory phasicity and response to augmentation. Popliteal Vein: No evidence of thrombus. Normal compressibility, respiratory phasicity and response to augmentation. Calf Veins: No evidence of thrombus. Normal compressibility and flow on color Doppler imaging. Superficial Great Saphenous Vein: No evidence of thrombus. Normal compressibility. Venous Reflux:  None. Other Findings:  Subcutaneous edema along the right ankle. LEFT LOWER EXTREMITY Common Femoral Vein: No evidence of thrombus. Normal compressibility, respiratory phasicity and response to augmentation. Saphenofemoral Junction: No evidence of thrombus. Normal compressibility and flow on color Doppler imaging. Profunda Femoral Vein: No evidence of thrombus. Normal compressibility and flow on color Doppler imaging. Femoral Vein: No evidence of thrombus. Normal compressibility, respiratory phasicity and response to augmentation. Popliteal Vein: No evidence of thrombus. Normal compressibility, respiratory phasicity and response to augmentation. Calf Veins: No evidence of thrombus. Normal compressibility and flow on color Doppler imaging. Superficial Great Saphenous Vein: No evidence of thrombus. Normal compressibility. Venous Reflux:  None. Other Findings:   Subcutaneous edema along the left ankle. IMPRESSION: 1. No findings of deep vein thrombosis of either lower extremity. 2. Bilateral subcutaneous edema along the ankles, right greater than left. Electronically Signed   By: Van Clines M.D.   On: 02/17/2018 10:43     Subjective: Pt says that she has much less SOB and feels at her baseline.    Discharge Exam: Vitals:   02/21/18 1452 02/21/18 1524  BP:  (!) 128/57  Pulse:  84  Resp:  18  Temp:  98.7 F (37.1 C)  SpO2: 98% 98%   Vitals:   02/21/18 0812 02/21/18 0822 02/21/18 1452 02/21/18 1524  BP:    (!) 128/57  Pulse:    84  Resp:    18  Temp:    98.7 F (37.1 C)  TempSrc:    Oral  SpO2: 98% 100% 98% 98%  Weight:      Height:       General exam:awake, alert, NAD.  Respiratory system:better air movement bilateral, shallow breaths bilateral.  bilateral expwheezing, no rales. Cardiovascular system:S1 &S2 heard, RRR. No JVD, murmurs, gallops, clicks or pedal  edema. Gastrointestinal system:Abdomen is nondistended, soft and nontender. Normal bowel sounds heard. Central nervous system:Alert and oriented. No focal neurological deficits. Extremities: 2+ pitting edema bilateral LEs.   The results of significant diagnostics from this hospitalization (including imaging, microbiology, ancillary and laboratory) are listed below for reference.    Microbiology: No results found for this or any previous visit (from the past 240 hour(s)).   Labs: BNP (last 3 results) Recent Labs    01/28/18 2330  BNP 433.2*   Basic Metabolic Panel: Recent Labs  Lab 02/15/18 1618 02/16/18 0704 02/18/18 0630  NA 138 138 138  K 3.7 4.2 4.5  CL 99 97* 98  CO2 32 33* 32  GLUCOSE 96 179* 146*  BUN 18 18 24*  CREATININE 0.75 0.90 0.78  CALCIUM 8.0* 8.1* 8.1*   Liver Function Tests: No results for input(s): AST, ALT, ALKPHOS, BILITOT, PROT, ALBUMIN in the last 168 hours. No results for input(s): LIPASE, AMYLASE in the last 168  hours. No results for input(s): AMMONIA in the last 168 hours. CBC: Recent Labs  Lab 02/15/18 1618 02/16/18 0704 02/17/18 1140 02/18/18 0630 02/19/18 0512  WBC 5.5 5.6 7.2 7.3 7.1  HGB 8.1* 7.6* 6.6* 9.1* 9.8*  HCT 28.4* 26.7* 23.4* 29.7* 32.4*  MCV 97.9 98.5 97.9 94.9 96.4  PLT 299 323 298 281 300   Cardiac Enzymes: Recent Labs  Lab 02/15/18 1701  TROPONINI <0.03   BNP: Invalid input(s): POCBNP CBG: No results for input(s): GLUCAP in the last 168 hours. D-Dimer No results for input(s): DDIMER in the last 72 hours. Hgb A1c No results for input(s): HGBA1C in the last 72 hours. Lipid Profile No results for input(s): CHOL, HDL, LDLCALC, TRIG, CHOLHDL, LDLDIRECT in the last 72 hours. Thyroid function studies No results for input(s): TSH, T4TOTAL, T3FREE, THYROIDAB in the last 72 hours.  Invalid input(s): FREET3 Anemia work up No results for input(s): VITAMINB12, FOLATE, FERRITIN, TIBC, IRON, RETICCTPCT in the last 72 hours. Urinalysis    Component Value Date/Time   COLORURINE YELLOW 01/19/2018 2130   APPEARANCEUR HAZY (A) 01/19/2018 2130   LABSPEC 1.019 01/19/2018 2130   PHURINE 6.0 01/19/2018 2130   GLUCOSEU NEGATIVE 01/19/2018 2130   HGBUR LARGE (A) 01/19/2018 2130   BILIRUBINUR NEGATIVE 01/19/2018 2130   BILIRUBINUR n 09/21/2016 1432   KETONESUR NEGATIVE 01/19/2018 2130   PROTEINUR 100 (A) 01/19/2018 2130   UROBILINOGEN 0.2 09/21/2016 1432   UROBILINOGEN 0.2 04/13/2009 2025   NITRITE NEGATIVE 01/19/2018 2130   LEUKOCYTESUR NEGATIVE 01/19/2018 2130   Sepsis Labs Invalid input(s): PROCALCITONIN,  WBC,  LACTICIDVEN Microbiology No results found for this or any previous visit (from the past 240 hour(s)).  Time coordinating discharge: 80 MINUTES  SIGNED:  Irwin Brakeman, MD  [facility] 02/21/2018, 4:05 PM How to contact the [facility] Attending or Consulting provider [place] or covering provider during after hours [place], for this patient?  1. Check  the care team in [facility] and look for a) attending/consulting [facility] provider listed and b) the [facility] team listed 2. Log into [email or web address] and use [place]'s universal password to access. If you do not have the password, please contact the hospital operator. 3. Locate the [facility] provider you are looking for under [facility] and page to a number that you can be directly reached. 4. If you still have difficulty reaching the provider, please page the [facility] (Director on Call) for the Hospitalists listed on amion for assistance.

## 2018-02-20 LAB — PROTIME-INR
INR: 1.86
Prothrombin Time: 21.2 s — ABNORMAL HIGH (ref 11.4–15.2)

## 2018-02-20 MED ORDER — WARFARIN SODIUM 5 MG PO TABS
5.0000 mg | ORAL_TABLET | Freq: Once | ORAL | Status: AC
  Administered 2018-02-20: 5 mg via ORAL
  Filled 2018-02-20: qty 1

## 2018-02-20 NOTE — Progress Notes (Signed)
Patient states this afternoon "my daughter couldn't talk she's having a procedure. So I can leave and call her later." notified CSW patient states her daughter will not be able to transport her for discharge and no other ride available at this time. CSW states prior bed offer has been rescinded and awaiting new bed offer and will notify nursing staff with plan as able. Donavan Foil, RN

## 2018-02-20 NOTE — Progress Notes (Signed)
02/20/2018 3:55 PM  Pt was seen and examined. Stable for discharge to SNF when bed available. Please see dicated DC summary.    Murvin Natal MD

## 2018-02-20 NOTE — Progress Notes (Signed)
Patient begged the staff not to wake her up to give medications or do any intervention while she was sleeping. Patient was educated on the importance of taking her medications and adhere to the treatment regimen.

## 2018-02-20 NOTE — Care Management Important Message (Signed)
Important Message  Patient Details  Name: [name] MRN: [id number] Date of Birth: May 30, 1950   Medicare Important Message Given:  Yes    [name] 02/20/2018, 2:27 PM

## 2018-02-20 NOTE — Progress Notes (Signed)
ANTICOAGULATION CONSULT NOTE -follow up [facility] for Coumadin Indication: Factor V deficiency(hypercoaguable state)  Allergies  Allergen Reactions  . Dilaudid [Hydromorphone Hcl] Hives and Nausea Only  . Minocycline Hcl     REACTION: Dizzy  . Prednisone     REACTION: feels like throat swelling, hallucinations  . Varenicline Tartrate     REACTION: Dizzy(chantix)   . Zocor [Simvastatin - High Dose] Other (See Comments)    myalgia    Patient Measurements: Height: 5\' 4"  (162.6 cm) Weight: 178 lb 12.7 oz (81.1 kg) IBW/kg (Calculated) : 54.7  Vital Signs: Temp: 98.2 F (36.8 C) (02/12 0630) Temp Source: Oral (02/12 0630) BP: 120/61 (02/12 0630) Pulse Rate: 73 (02/12 0630)  Labs: Recent Labs    02/17/18 1140 02/18/18 0630 02/19/18 0512 02/20/18 0519  HGB 6.6* 9.1* 9.8*  --   HCT 23.4* 29.7* 32.4*  --   PLT 298 281 300  --   LABPROT  --  33.2* 25.3* 21.2*  INR  --  3.32 2.33 1.86  CREATININE  --  0.78  --   --     Estimated Creatinine Clearance: 70.3 mL/min (by C-G formula based on SCr of 0.78 mg/dL).   Medical History: Past Medical History:  Diagnosis Date  . Acute MI ([place])    x4, code blue x3  . Arteriosclerotic cardiovascular disease (ASCVD) 2002   Inf STEMI-2002. 2003-cutting balloon + brachytherapy for restenosis; subsequent acute stent thrombosis 06/2010 requiring 2 separate interventions (Zeta stent, then repeat cath with thrombectomy). focal basal inf AK, nl EF; 03/2011: Patent stents, minor nonobst  residual dz, nl EF; neg stress nuclear in 2008 and stress echo in 2009  . Chronic anticoagulation    Warfarin plus ticagrelor  . Chronic respiratory failure ([place]) 08/27/2013   On 2L 02  . COPD (chronic obstructive pulmonary disease) ([place])    02 dependent  . COPD (chronic obstructive pulmonary disease) ([place])   . Factor 5 Leiden mutation, heterozygous ([place])   . Factor V Leiden, prothrombin gene mutation ([place]) 2006  . Hyperlipidemia   .  Hypothyroidism   . Noncompliance   . Pelvic fracture ([place]) 2009  . Pulmonary embolism ([place]) 2006   Associated with deep vein thrombosis-2006; + factor V Leiden  . Tobacco abuse    50 pack years    Medications:  See med rec  Assessment: Patient presented to ED with dyspnea and fatigue. Patient is on chronic coumadin therapy for Factor V Leiden deficiency Pharmacy asked to dose Coumadin.  Home dose of warfarin: 5 mg Mon, Wed, Friday and 6mg  all other days  Goal of Therapy:  INR 2-3 Monitor platelets by anticoagulation protocol: Yes   Plan:  Warfarin 5 mg x 1 dose PT-INR daily Monitor for S/S of bleeding  Margot Ables, PharmD Clinical Pharmacist 02/20/2018 10:57 AM

## 2018-02-20 NOTE — Care Management (Signed)
Pt attempted to call [employer] to appeal DC. PT placed on hold and at 5pm was told to hang up and call back. Pt will attempt to call again in the AM.   Pt lives alone, has daughter that lives towards [place], she has friend Shauna Hugh, who lives nearby and is undependable. Pt's HH had not started prior to coming to hospital. When she left SNF she was able to ambulate long distances pushing WC and cooked her own hotdog. She got home, cooked one mean and got sick, the next day the nurse came and pt was weakened and scared to be alone, [facility] came to do admission visit and called EMS to bring pt to hospital. Pt found to be anemic and have blood clot (per pt). Pt tried to apply for medicaid in 2009 but was denied. She gets a check for $1300 a month, has mortgage and a large sum of medical bills. Pt says she Is able to ambulate currently short distances. She will not be able to do housework, prepare meals. Pt open to ALF or ind living scenario.   CM will ask FC to assess pt for medicaid eligibility. CM will ask [facility] rep to come speak with pt about ind living tomorrow.

## 2018-02-20 NOTE — Clinical Social Work Note (Signed)
Pt unable to go to any facility as she has only 4 days left of rehab coverage, and has no money for copay.  When I delivered this news to patient, and told her the plan was to send her home with HHPT and nursing, she became tearful and distraught, stating that we are "sending me home to die," and that she is sure her daughter, who she states is only support, is having some kind of emergency of her own, and is unavailable for emotional or psysical support.  Pt wishes to appeal her discharge.  Gave her instructions for calling to appeal.  Informed nurse, who stated she will let Dr know.

## 2018-02-20 NOTE — Progress Notes (Signed)
02/20/2018 3:40 PM  Received message from care management that patient needs home health orders and will not be able to discharge to SNF.  I have asked for care management, social workers and administration to come up with a safe discharge plan for patient. I was told that patient in process of appealing her discharge and is concerned that she will not be able to care for self at home alone.    Murvin Natal MD

## 2018-02-21 ENCOUNTER — Ambulatory Visit: Payer: Medicare Other | Admitting: Adult Health

## 2018-02-21 DIAGNOSIS — Z0289 Encounter for other administrative examinations: Secondary | ICD-10-CM

## 2018-02-21 LAB — PROTIME-INR
INR: 1.67
PROTHROMBIN TIME: 19.5 s — AB (ref 11.4–15.2)

## 2018-02-21 MED ORDER — ENOXAPARIN SODIUM 80 MG/0.8ML ~~LOC~~ SOLN
80.0000 mg | Freq: Two times a day (BID) | SUBCUTANEOUS | Status: DC
  Administered 2018-02-21: 80 mg via SUBCUTANEOUS
  Filled 2018-02-21: qty 0.8

## 2018-02-21 MED ORDER — FERROUS SULFATE 324 (65 FE) MG PO TBEC: 1.0000 | DELAYED_RELEASE_TABLET | Freq: Every day | ORAL | Status: DC

## 2018-02-21 MED ORDER — WARFARIN SODIUM 7.5 MG PO TABS
7.5000 mg | ORAL_TABLET | Freq: Once | ORAL | Status: AC
  Administered 2018-02-21: 7.5 mg via ORAL
  Filled 2018-02-21: qty 1

## 2018-02-21 MED ORDER — FOLIC ACID 1 MG PO TABS: 1.0000 mg | ORAL_TABLET | Freq: Every day | ORAL | 3 refills | Status: DC

## 2018-02-21 NOTE — Progress Notes (Signed)
ANTICOAGULATION CONSULT NOTE -follow up [facility] for Coumadin Indication: Factor V deficiency(hypercoaguable state)  Home dose of warfarin: 5 mg Mon, Wed, Friday and 6mg  all other days  Allergies  Allergen Reactions  . Dilaudid [Hydromorphone Hcl] Hives and Nausea Only  . Minocycline Hcl     REACTION: Dizzy  . Prednisone     REACTION: feels like throat swelling, hallucinations  . Varenicline Tartrate     REACTION: Dizzy(chantix)   . Zocor [Simvastatin - High Dose] Other (See Comments)    myalgia    Patient Measurements: Height: 5\' 4"  (162.6 cm) Weight: 179 lb 0.2 oz (81.2 kg) IBW/kg (Calculated) : 54.7  Vital Signs: Temp: 98.3 F (36.8 C) (02/13 0608) Temp Source: Oral (02/13 0608) BP: 133/64 (02/13 0608) Pulse Rate: 87 (02/13 0608)  Labs: Recent Labs    02/19/18 0512 02/20/18 0519 02/21/18 0559  HGB 9.8*  --   --   HCT 32.4*  --   --   PLT 300  --   --   LABPROT 25.3* 21.2* 19.5*  INR 2.33 1.86 1.67    Estimated Creatinine Clearance: 70.3 mL/min (by C-G formula based on SCr of 0.78 mg/dL).   Medical History: Past Medical History:  Diagnosis Date  . Acute MI ([place])    x4, code blue x3  . Arteriosclerotic cardiovascular disease (ASCVD) 2002   Inf STEMI-2002. 2003-cutting balloon + brachytherapy for restenosis; subsequent acute stent thrombosis 06/2010 requiring 2 separate interventions (Zeta stent, then repeat cath with thrombectomy). focal basal inf AK, nl EF; 03/2011: Patent stents, minor nonobst  residual dz, nl EF; neg stress nuclear in 2008 and stress echo in 2009  . Chronic anticoagulation    Warfarin plus ticagrelor  . Chronic respiratory failure ([place]) 08/27/2013   On 2L 02  . COPD (chronic obstructive pulmonary disease) ([place])    02 dependent  . COPD (chronic obstructive pulmonary disease) ([place])   . Factor 5 Leiden mutation, heterozygous ([place])   . Factor V Leiden, prothrombin gene mutation ([place]) 2006  . Hyperlipidemia   .  Hypothyroidism   . Noncompliance   . Pelvic fracture ([place]) 2009  . Pulmonary embolism ([place]) 2006   Associated with deep vein thrombosis-2006; + factor V Leiden  . Tobacco abuse    50 pack years    Medications:  On Br[place] for prior MI  Assessment: Patient presented to ED with dyspnea and fatigue. Pharmacy asked to dose warfarin for this 68 yo female who's on chronic coumadin  therapy for Factor V Leiden deficiency.   INR has been trending down over the past several days and remains sub-therapeutic at 1.67 today.   Goal of Therapy:  INR 2-3 Monitor platelets by anticoagulation protocol: Yes   Plan:  Give warfarin 7.5 mg x 1 dose (slight boost for downward  INR trend) PT-INR daily Monitor for S/S of bleeding  Despina Pole, Pharm. D. Clinical Pharmacist 02/21/2018 12:43 PM

## 2018-02-21 NOTE — Progress Notes (Signed)
Physical Therapy Treatment Patient Details Name: [name] MRN: [id number] DOB: Aug 23, 1950 Today's Date: 02/21/2018    History of Present Illness [name] is a 68 y.o. female with medical history significant for chronic respiratory failure on 4 L O2 via [place] at home, COPD, CAD, hypothyroidism, and hypercoagulable state/factor V Leiden deficiency on chronic Coumadin who presents to the ED with complaint of progressive shortness of breath, fatigue, nonproductive cough, and difficulty caring for oneself.  She was recently admitted from 1/20-1/24/2020 for exacerbation of her COPD and discharged to [facility] rehab facility.  She says she was doing fairly well there while working with physical therapy.  She was discharged to home 3 days ago.  She says her home nebulizer machine was unplugged about 2 days ago from her chart and stripped and she was unable to plug herself.  She called EMS who did plug-in for her.  Her home PT came this morning and noticed she was doing unwell and therefore called EMS.    PT Comments    Patient demonstrates good return for sitting up at bedside and donning TLSO brace without assistance, ambulated in hallway without loss of balance, limited mostly due to mild SOB with O2 saturation remaining above 93% while on 4 LPM.  Patient tolerated sitting up in chair after therapy.  Patient will benefit from continued physical therapy in hospital and recommended venue below to increase strength, balance, endurance for safe ADLs and gait.    Follow Up Recommendations  SNF;Supervision - Intermittent     Equipment Recommendations  Rolling walker with 5" wheels(Rollator walker)    Recommendations for Other Services       Precautions / Restrictions Precautions Precautions: None Required Braces or Orthoses: Spinal Brace Spinal Brace: Thoracolumbosacral orthotic Restrictions Weight Bearing Restrictions: No    Mobility  Bed Mobility Overal bed mobility: Modified  Independent             General bed mobility comments: increased time  Transfers Overall transfer level: Modified independent Equipment used: Rolling walker (2 wheeled) Transfers: Sit to/from [facility] Sit to Stand: Modified independent (Device/Increase time) Stand pivot transfers: Modified independent (Device/Increase time)       General transfer comment: increased time with labored movement, able to step backwards a few steps during stand to sitting  Ambulation/Gait   Gait Distance (Feet): 120 Feet Assistive device: Rolling walker (2 wheeled) Gait Pattern/deviations: Decreased step length - right;Decreased step length - left;Decreased stride length Gait velocity: decreased   General Gait Details: slightly labored cadence without loss of balance, limited mostly due to SOB and fatigue, On 4 LPM O2 with O2 saturation between 93-94%   Stairs             Wheelchair Mobility    Modified Rankin (Stroke Patients Only)       Balance Overall balance assessment: Needs assistance Sitting-balance support: Feet supported;No upper extremity supported Sitting balance-Leahy Scale: Good Sitting balance - Comments: able to donn TLSO brace while seated at bedside   Standing balance support: Bilateral upper extremity supported;During functional activity Standing balance-Leahy Scale: Fair Standing balance comment: using RW                            Cognition Arousal/Alertness: Awake/alert Behavior During Therapy: WFL for tasks assessed/performed Overall Cognitive Status: Within Functional Limits for tasks assessed  Exercises General Exercises - Lower Extremity Long Arc Quad: Seated;AROM;Strengthening;Both;10 reps Hip Flexion/Marching: Seated;Strengthening;AROM;Both;10 reps Toe Raises: Seated;AROM;Strengthening;Both;10 reps Heel Raises: Seated;AROM;Strengthening;Both;10 reps     General Comments        Pertinent Vitals/Pain Pain Assessment: Faces Faces Pain Scale: Hurts a little bit Pain Location: back with mobility Pain Descriptors / Indicators: Discomfort;Grimacing Pain Intervention(s): Limited activity within patient's tolerance;Monitored during session    Home Living                      Prior Function            PT Goals (current goals can now be found in the care plan section) Acute Rehab PT Goals Patient Stated Goal: return home after rehab PT Goal Formulation: With patient Time For Goal Achievement: 03/04/18 Potential to Achieve Goals: Good Progress towards PT goals: Progressing toward goals    Frequency    Min 3X/week      PT Plan Current plan remains appropriate    Co-evaluation              AM-PAC PT "6 Clicks" Mobility   Outcome Measure  Help needed turning from your back to your side while in a flat bed without using bedrails?: None Help needed moving from lying on your back to sitting on the side of a flat bed without using bedrails?: None Help needed moving to and from a bed to a chair (including a wheelchair)?: None Help needed standing up from a chair using your arms (e.g., wheelchair or bedside chair)?: None Help needed to walk in hospital room?: A Little Help needed climbing 3-5 steps with a railing? : A Little 6 Click Score: 22    End of Session Equipment Utilized During Treatment: Oxygen Activity Tolerance: Patient tolerated treatment well;Patient limited by fatigue Patient left: in chair;with call bell/phone within reach Nurse Communication: Mobility status PT Visit Diagnosis: Other abnormalities of gait and mobility (R26.89);Unsteadiness on feet (R26.81);Muscle weakness (generalized) (M62.81)     Time: 2706-2376 PT Time Calculation (min) (ACUTE ONLY): 33 min  Charges:  $Gait Training: 8-22 mins $Therapeutic Exercise: 8-22 mins                     12:06 PM, 02/21/18 Lonell Grandchild,  MPT Physical Therapist with [facility] [phone] office [phone] mobile phone

## 2018-02-21 NOTE — Progress Notes (Signed)
02/21/2018 9:06 AM  The patient was seen and examined today and she is stable and remains stable for discharge.  She is currently in the process of going through Medicare appeal for discharge.  She intends to call this morning.  I will meet with the care managers and social workers later this morning to discuss safety discharge planning for the patient.  The patient tells me that she cannot afford to pay for long-term skilled nursing care.  She reports that she has no one that can stay with her at home 24/7.  Patient says that she does not have many skilled nursing care Medicare days left and that has caused her to be declined by some of the skilled nursing facilities.  Murvin Natal MD

## 2018-02-21 NOTE — Progress Notes (Signed)
Patient has advanced chronic respiratory failure secondary to COPD. Ventilator ordered to sustain life and reduce hospitalizations.

## 2018-02-21 NOTE — Progress Notes (Deleted)
   Subjective:    Patient ID: [name], female    DOB: [birth year], 68 y.o.   MRN: [id number]  HPI    Review of Systems     Objective:   Physical Exam        Assessment & Plan:

## 2018-02-21 NOTE — Clinical Social Work Note (Signed)
Pt has agreed to return home after finding out yesterday that with only 4 days of rehab coverage left that no one will take her unless she is willing to pay co-pay.  She states she is unable to do so as she will lose her home if she does not make regular monthly payments.  [facility] came to talk to her about independent living, and again pt states if she goes there she will lose her home.  Pt will receive HH PT through [facility], get O2 from [facility] and will also be provided a ventilator for nightime breathing, as well as rollator to take with.

## 2018-02-21 NOTE — Telephone Encounter (Signed)
Patient is still admitted to [facility].  We have contacted patient several times with no success.  Will close encounter.

## 2018-02-21 NOTE — Progress Notes (Signed)
Patient asked not to be disturbed if she is sleeping.  Asked patient about vital signs and medications and patient replied that she didn't want to be disturbed until breakfast.  Will pass to the day shift to retreive vitals at breakfast.

## 2018-02-21 NOTE — Discharge Instructions (Signed)
PLEASE MAKE FOLLOW UP APPOINTMENT WITH NEUROSURGERY TO BE SEEN IN 7-10 DAYS.  PLEASE WEAR TLSO BRACE WITH AMBULATING AND SITTING UP IN CHAIR.   SEEK MEDICAL CARE OR RETURN TO ER IF SYMPTOMS COME BACK, WORSEN OR NEW PROBLEM DEVELOPS.  PLEASE MONITOR YOUR PT/INR EVERY 3 DAYS AND ADJUST WARFARIN DOSES AS NEEDED FOR GOAL INR 2-3.   PLEASE FOLLOW UP WITH PRIMARY CARE PROVIDER IN 1 WEEKS.  NO SMOKING RECOMMENDED AT THIS TIME.    IMPORTANT INFORMATION: PAY CLOSE ATTENTION   PHYSICIAN DISCHARGE INSTRUCTIONS  Follow with Primary care provider  [name], NP  and other consultants as instructed your Hospitalist Physician  SEEK MEDICAL CARE OR RETURN TO EMERGENCY ROOM IF SYMPTOMS COME BACK, WORSEN OR NEW PROBLEM DEVELOPS.   Please note: You were cared for by a hospitalist during your hospital stay. Every effort will be made to forward records to your primary care provider.  You can request that your primary care provider send for your hospital records if they have not received them.  Once you are discharged, your primary care physician will handle any further medical issues. Please note that NO REFILLS for any discharge medications will be authorized once you are discharged, as it is imperative that you return to your primary care physician (or establish a relationship with a primary care physician if you do not have one) for your post hospital discharge needs so that they can reassess your need for medications and monitor your lab values.  Please get a complete blood count and chemistry panel checked by your Primary MD at your next visit, and again as instructed by your Primary MD.  Get Medicines reviewed and adjusted: Please take all your medications with you for your next visit with your Primary MD  Laboratory/radiological data: Please request your Primary MD to go over all hospital tests and procedure/radiological results at the follow up, please ask your primary care provider to get all  Hospital records sent to his/her office.  In some cases, they will be blood work, cultures and biopsy results pending at the time of your discharge. Please request that your primary care provider follow up on these results.  If you are diabetic, please bring your blood sugar readings with you to your follow up appointment with primary care.    Please call and make your follow up appointments as soon as possible.    Also Note the following: If you experience worsening of your admission symptoms, develop shortness of breath, life threatening emergency, suicidal or homicidal thoughts you must seek medical attention immediately by calling 911 or calling your MD immediately  if symptoms less severe.  You must read complete instructions/literature along with all the possible adverse reactions/side effects for all the Medicines you take and that have been prescribed to you. Take any new Medicines after you have completely understood and accpet all the possible adverse reactions/side effects.   Do not drive when taking Pain medications or sleeping medications (Benzodiazepines)  Do not take more than prescribed Pain, Sleep and Anxiety Medications. It is not advisable to combine anxiety,sleep and pain medications without talking with your primary care practitioner  Special Instructions: If you have smoked or chewed Tobacco  in the last 2 yrs please stop smoking, stop any regular Alcohol  and or any Recreational drug use.  Wear Seat belts while driving.     Coping with Quitting Smoking  Quitting smoking is a physical and mental challenge. You will face cravings, withdrawal symptoms,  and temptation. Before quitting, work with your health care provider to make a plan that can help you cope. Preparation can help you quit and keep you from giving in. How can I cope with cravings? Cravings usually last for 5-10 minutes. If you get through it, the craving will pass. Consider taking the following actions  to help you cope with cravings:  Keep your mouth busy: ? Chew sugar-free gum. ? Suck on hard candies or a straw. ? Brush your teeth.  Keep your hands and body busy: ? Immediately change to a different activity when you feel a craving. ? Squeeze or play with a ball. ? Do an activity or a hobby, like making bead jewelry, practicing needlepoint, or working with wood. ? Mix up your normal routine. ? Take a short exercise break. Go for a quick walk or run up and down stairs. ? Spend time in public places where smoking is not allowed.  Focus on doing something kind or helpful for someone else.  Call a friend or family member to talk during a craving.  Join a support group.  Call a quit line, such as 1-800-QUIT-NOW.  Talk with your health care provider about medicines that might help you cope with cravings and make quitting easier for you. How can I deal with withdrawal symptoms? Your body may experience negative effects as it tries to get used to not having nicotine in the system. These effects are called withdrawal symptoms. They may include:  Feeling hungrier than normal.  Trouble concentrating.  Irritability.  Trouble sleeping.  Feeling depressed.  Restlessness and agitation.  Craving a cigarette. To manage withdrawal symptoms:  Avoid places, people, and activities that trigger your cravings.  Remember why you want to quit.  Get plenty of sleep.  Avoid coffee and other caffeinated drinks. These may worsen some of your symptoms. How can I handle social situations? Social situations can be difficult when you are quitting smoking, especially in the first few weeks. To manage this, you can:  Avoid parties, bars, and other social situations where people might be smoking.  Avoid alcohol.  Leave right away if you have the urge to smoke.  Explain to your family and friends that you are quitting smoking. Ask for understanding and support.  Plan activities with friends  or family where smoking is not an option. What are some ways I can cope with stress? Wanting to smoke may cause stress, and stress can make you want to smoke. Find ways to manage your stress. Relaxation techniques can help. For example:  Breathe slowly and deeply, in through your nose and out through your mouth.  Listen to soothing, relaxing music.  Talk with a family member or friend about your stress.  Light a candle.  Soak in a bath or take a shower.  Think about a peaceful place. What are some ways I can prevent weight gain? Be aware that many people gain weight after they quit smoking. However, not everyone does. To keep from gaining weight, have a plan in place before you quit and stick to the plan after you quit. Your plan should include:  Having healthy snacks. When you have a craving, it may help to: ? Eat plain popcorn, crunchy carrots, celery, or other cut vegetables. ? Chew sugar-free gum.  Changing how you eat: ? Eat small portion sizes at meals. ? Eat 4-6 small meals throughout the day instead of 1-2 large meals a day. ? Be mindful when you eat. Do not  watch television or do other things that might distract you as you eat.  Exercising regularly: ? Make time to exercise each day. If you do not have time for a long workout, do short bouts of exercise for 5-10 minutes several times a day. ? Do some form of strengthening exercise, like weight lifting, and some form of aerobic exercise, like running or swimming.  Drinking plenty of water or other low-calorie or no-calorie drinks. Drink 6-8 glasses of water daily, or as much as instructed by your health care provider. Summary  Quitting smoking is a physical and mental challenge. You will face cravings, withdrawal symptoms, and temptation to smoke again. Preparation can help you as you go through these challenges.  You can cope with cravings by keeping your mouth busy (such as by chewing gum), keeping your body and hands  busy, and making calls to family, friends, or a helpline for people who want to quit smoking.  You can cope with withdrawal symptoms by avoiding places where people smoke, avoiding drinks with caffeine, and getting plenty of rest.  Ask your health care provider about the different ways to prevent weight gain, avoid stress, and handle social situations. This information is not intended to replace advice given to you by your health care provider. Make sure you discuss any questions you have with your health care provider. Document Released: 12/24/2015 Document Revised: 12/24/2015 Document Reviewed: 12/24/2015 Elsevier Interactive Patient Education  2019 [employer].    Steps to Quit Smoking  Smoking tobacco can be harmful to your health and can affect almost every organ in your body. Smoking puts you, and those around you, at risk for developing many serious chronic diseases. Quitting smoking is difficult, but it is one of the best things that you can do for your health. It is never too late to quit. What are the benefits of quitting smoking? When you quit smoking, you lower your risk of developing serious diseases and conditions, such as:  Lung cancer or lung disease, such as COPD.  Heart disease.  Stroke.  Heart attack.  Infertility.  Osteoporosis and bone fractures. Additionally, symptoms such as coughing, wheezing, and shortness of breath may get better when you quit. You may also find that you get sick less often because your body is stronger at fighting off colds and infections. If you are pregnant, quitting smoking can help to reduce your chances of having a baby of low birth weight. How do I get ready to quit? When you decide to quit smoking, create a plan to make sure that you are successful. Before you quit:  Pick a date to quit. Set a date within the next two weeks to give you time to prepare.  Write down the reasons why you are quitting. Keep this list in places where  you will see it often, such as on your bathroom mirror or in your car or wallet.  Identify the people, places, things, and activities that make you want to smoke (triggers) and avoid them. Make sure to take these actions: ? Throw away all cigarettes at home, at work, and in your car. ? Throw away smoking accessories, such as Scientist, research (medical). ? Clean your car and make sure to empty the ashtray. ? Clean your home, including curtains and carpets.  Tell your family, friends, and coworkers that you are quitting. Support from your loved ones can make quitting easier.  Talk with your health care provider about your options for quitting smoking.  Find  out what treatment options are covered by your health insurance. What strategies can I use to quit smoking? Talk with your healthcare provider about different strategies to quit smoking. Some strategies include:  Quitting smoking altogether instead of gradually lessening how much you smoke over a period of time. Research shows that quitting cold [place] is more successful than gradually quitting.  Attending in-person counseling to help you build problem-solving skills. You are more likely to have success in quitting if you attend several counseling sessions. Even short sessions of 10 minutes can be effective.  Finding resources and support systems that can help you to quit smoking and remain smoke-free after you quit. These resources are most helpful when you use them often. They can include: ? Online chats with a Social worker. ? Telephone quitlines. ? Careers information officer. ? Support groups or group counseling. ? Text messaging programs. ? Mobile phone applications.  Taking medicines to help you quit smoking. (If you are pregnant or breastfeeding, talk with your health care provider first.) Some medicines contain nicotine and some do not. Both types of medicines help with cravings, but the medicines that include nicotine help to relieve  withdrawal symptoms. Your health care provider may recommend: ? Nicotine patches, gum, or lozenges. ? Nicotine inhalers or sprays. ? Non-nicotine medicine that is taken by mouth. Talk with your health care provider about combining strategies, such as taking medicines while you are also receiving in-person counseling. Using these two strategies together makes you more likely to succeed in quitting than if you used either strategy on its own. If you are pregnant or breastfeeding, talk with your health care provider about finding counseling or other support strategies to quit smoking. Do not take medicine to help you quit smoking unless told to do so by your health care provider. What things can I do to make it easier to quit? Quitting smoking might feel overwhelming at first, but there is a lot that you can do to make it easier. Take these important actions:  Reach out to your family and friends and ask that they support and encourage you during this time. Call telephone quitlines, reach out to support groups, or work with a counselor for support.  Ask people who smoke to avoid smoking around you.  Avoid places that trigger you to smoke, such as bars, parties, or smoke-break areas at work.  Spend time around people who do not smoke.  Lessen stress in your life, because stress can be a smoking trigger for some people. To lessen stress, try: ? Exercising regularly. ? Deep-breathing exercises. ? Yoga. ? Meditating. ? Performing a body scan. This involves closing your eyes, scanning your body from head to toe, and noticing which parts of your body are particularly tense. Purposefully relax the muscles in those areas.  Download or purchase mobile phone or tablet apps (applications) that can help you stick to your quit plan by providing reminders, tips, and encouragement. There are many free apps, such as QuitGuide from the [employer] Office manager for Disease Control and Prevention). You can find other support  for quitting smoking (smoking cessation) through smokefree.gov and other websites. How will I feel when I quit smoking? Within the first 24 hours of quitting smoking, you may start to feel some withdrawal symptoms. These symptoms are usually most noticeable 2-3 days after quitting, but they usually do not last beyond 2-3 weeks. Changes or symptoms that you might experience include:  Mood swings.  Restlessness, anxiety, or irritation.  Difficulty concentrating.  Dizziness.  Strong cravings for sugary foods in addition to nicotine.  Mild weight gain.  Constipation.  Nausea.  Coughing or a sore throat.  Changes in how your medicines work in your body.  A depressed mood.  Difficulty sleeping (insomnia). After the first 2-3 weeks of quitting, you may start to notice more positive results, such as:  Improved sense of smell and taste.  Decreased coughing and sore throat.  Slower heart rate.  Lower blood pressure.  Clearer skin.  The ability to breathe more easily.  Fewer sick days. Quitting smoking is very challenging for most people. Do not get discouraged if you are not successful the first time. Some people need to make many attempts to quit before they achieve long-term success. Do your best to stick to your quit plan, and talk with your health care provider if you have any questions or concerns. This information is not intended to replace advice given to you by your health care provider. Make sure you discuss any questions you have with your health care provider. Document Released: 12/20/2000 Document Revised: 08/01/2016 Document Reviewed: 05/12/2014 Elsevier Interactive Patient Education  2019 [place].    Bleeding Precautions When on Anticoagulant Therapy, Adult Anticoagulant therapy, also called blood thinner therapy, is medicine that helps to prevent and treat blood clots. The medicine works by stopping blood clots from forming or growing. Blood clots that  form in your blood vessels can be dangerous. They can break loose and travel to the heart, lungs, or brain. This increases the risk of a heart attack, stroke, or blocked lung artery (pulmonary embolism). Anticoagulants also increase the risk of bleeding. Try to protect yourself from cuts and other injuries that can cause bleeding. It is important to take anticoagulants exactly as told by your health care provider. Why do I need to be on anticoagulant therapy? You may need this medicine if you are at risk of developing a blood clot. Conditions that increase your risk of a blood clot include:  Being born with heart disease or a heart malformation (congenital heart disease).  Developing heart disease.  Having had surgery, such as valve replacement.  Having had a serious accident or other type of severe injury (trauma).  Having certain types of cancer.  Having certain diseases that can increase blood clotting.  Having a high risk of stroke or heart attack.  Having atrial fibrillation (AF). What are the common anticoagulant medicines? There are several types of anticoagulant medicines. The most common types are:  Medicines that you take by mouth (oral medicines), such as: ? Warfarin. ? Novel oral anticoagulants (NOACs), such as: ? Direct thrombin inhibitors (dabigatran). ? Factor Xa inhibitors (apixaban, edoxaban, and rivaroxaban).  Injections, such as: ? Unfractionated heparin. ? Low molecular weight heparin. These anticoagulants work in different ways to prevent blood clots. They also have different risks and side effects. What do I need to remember while on anticoagulant therapy? Taking anticoagulants  Take your medicine at the same time every day. If you forget to take your medicine, take it as soon as you remember. Do not double your dosage of medicine if you miss a whole day. Take your normal dose and call your health care provider.  Do not stop taking your medicine unless your  health care provider approves. Stopping the medicine can increase your risk of developing a blood clot. Taking other medicines  Take over-the-counter and prescriptions medicines only as told by your health care provider.  Do not take over-the-counter NSAIDs,  including aspirin and ibuprofen, while you are on anticoagulant therapy. These medicines increase your risk of dangerous bleeding.  Get approval from your health care provider before you start taking any new medicines, vitamins, or herbal products. Some of these could interfere with your therapy. General instructions  Keep all follow-up visits as told by your health care provider. This is important.  If you are pregnant or trying to get pregnant, talk with a health care provider about anticoagulants. Some of these medicines are not safe to take during pregnancy.  Tell all health care providers, including your dentist, that you are on anticoagulant therapy. It is especially important to tell providers before you have any surgery, medical procedures, or dental work done. What precautions should I take?   Be very careful when using knives, scissors, or other sharp objects.  Use an electric razor instead of a blade.  Do not use toothpicks.  Use a soft-bristled toothbrush. Brush your teeth gently.  Always wear shoes outdoors and wear slippers indoors.  Be careful when cutting your fingernails and toenails.  Place bath mats in the bathroom. If possible, install handrails as well.  Wear gloves while you do yard work.  Wear your seat belt.  Prevent falls by removing loose rugs and extension cords from areas where you walk. Use a cane or walker if you need it.  Avoid constipation by: ? Drinking enough fluid to keep your urine clear or pale yellow. ? Eating foods that are high in fiber, such as fresh fruits and vegetables, whole grains, and beans. ? Limiting foods that are high in fat and processed sugars, such as fried and sweet  foods.  Do not play contact sports or participate in other activities that have a high risk for injury. What other precautions are important if on warfarin therapy? If you are taking a type of anticoagulant called warfarin, make sure you:  Work with a diet and nutrition specialist (dietitian) to make an eating plan. Do not make any sudden changes to your diet after you have started your eating plan.  Do not drink alcohol. It can interfere with your medicine and increase your risk of an injury that causes bleeding.  Get regular blood tests as told by your health care provider. What are some questions to ask my health care provider?  Why do I need anticoagulant therapy?  What is the best anticoagulant therapy for my condition?  How long will I need anticoagulant therapy?  What are the side effects of anticoagulant therapy?  When should I take my medicine? What should I do if I forget to take it?  Will I need to have regular blood tests?  Do I need to change my diet? Are there foods or drinks that I should avoid?  What activities are safe for me?  What should I do if I want to get pregnant? Contact a health care provider if:  You miss a dose of medicine: ? And you are not sure what to do. ? For more than one day.  You have: ? Menstrual bleeding that is heavier than normal. ? Bloody or brown urine. ? Easy bruising. ? Black and tarry stool or bright red stool. ? Side effects from your medicine.  You feel weak or dizzy.  You become pregnant. Get help right away if:  You have bleeding that will not stop within 20 minutes from: ? The nose. ? The gums. ? A cut on the skin.  You have a severe headache  or stomachache.  You vomit or cough up blood.  You fall or hit your head. Summary  Anticoagulant therapy, also called blood thinner therapy, is medicine that helps to prevent and treat blood clots.  Anticoagulants work in different ways to prevent blood clots. They  also have different risks and side effects.  Talk with your health care provider about any precautions that you should take while on anticoagulant therapy. This information is not intended to replace advice given to you by your health care provider. Make sure you discuss any questions you have with your health care provider. Document Released: 12/07/2014 Document Revised: 03/14/2016 Document Reviewed: 03/14/2016 Elsevier Interactive Patient Education  2019 [employer].

## 2018-02-21 NOTE — Care Management Note (Signed)
Case Management Note  Patient Details  Name: [name] MRN: [id number] Date of Birth: 12/23/1950  Action/Plan: Pt up with PT today. After speaking with [facility] living rep, pt unable to afford to live there, could possibly afford to go for a week on trial run. Pt worried about inability to ambulate distances. Would like rollator. Pt has no preference of provider. Referral given to [facility] rep, will be delivered to pt room prior to DC. Per [facility] rep, [facility] rep, aware of DC. Pt reluctantly will DC home today.   Expected Discharge Date:  02/19/18               Expected Discharge Plan:  [place]  In-House Referral:  Clinical Social Work  Discharge planning Services  CM Consult  Post Acute Care Choice:  Durable Medical Equipment, Home Health Choice offered to:  Patient  DME Arranged:  Walker rolling with seat DME Agency:  [facility] Arranged:  RN, PT, Nurse's Aide [place] Agency:  [facility]  Status of Service:  Completed, signed off  Sherald Barge, RN 02/21/2018, 11:48 AM

## 2018-02-22 ENCOUNTER — Telehealth: Payer: Self-pay | Admitting: *Deleted

## 2018-02-22 NOTE — Telephone Encounter (Signed)
Sending to [facility] as [employer]

## 2018-02-22 NOTE — Telephone Encounter (Signed)
[name], [place] evaluation today.  Patient is unable to get up- has key hidden outside. Dog urine and feces on floor, strong ammonia odor, no reliable help for patient,cluttered living area, not eating or drinking. Patient can not be picked up until area is cleared- Care can not continue until changes are made- unsafe for everyone involved.  Patient has no medication in home. Patient states daughter has the medications and is 1 hour away. Patient states daughter has been sick as well.   [facility] has been notified.

## 2018-02-25 ENCOUNTER — Telehealth: Payer: Self-pay | Admitting: Pulmonary Disease

## 2018-02-25 NOTE — Telephone Encounter (Signed)
Left message for patient to call back  

## 2018-02-25 NOTE — Telephone Encounter (Signed)
Spoke with patient and advised her of Brian's recommendations. Patient stated that she is not able to go anywhere at the moment since her Lucianne Lei is stuck in the mud in her yard due to the recent rain. She also stated that [facility] told her that there is not much more that they can do for her.   I asked her about her nebs and she stated that she is using those religiously. I asked her about the prednisone and she said that she is refusing to take the prednisone. She doesn't like the way it makes her feel. Tried explained to her that Prednisone would help with her symptoms, just give a chance. She still refused.   Aaron Edelman, please advise. Thanks!

## 2018-02-25 NOTE — Telephone Encounter (Signed)
As stated in previous message.  Patient needs follow-up with our office.   I am sorry that she is currently dealing with the symptoms.  But she needs further assessment has not been seen in our office since September 2019 and has had a recent COPD exacerbation which she is refusing to take the steroids that they provided for her at [facility].  If she is unable to drive and symptoms are concerning that she needs to call 911 to be transported to an emergency room.  Pt also needs follow up with Primary Care.   Wyn Quaker FNP

## 2018-02-25 NOTE — Telephone Encounter (Signed)
Primary Pulmonologist: Dr Lake Bells Last office visit and with whom: 10/04/17, Dr Lake Bells What do we see them for (pulmonary problems):Hemoptysis Last OV assessment/plan:  Instructions      Return in about 6 months (around 04/04/2018).  Need to do a bronchoscopy  COPD Continue using Brovana and Pulmicort   Chronic Respiratory Failure with Hypoxemia Continue oxygen as you are doing  Factor 5/ history of pulmonary embolism  [facility] PT/INR        Was appointment offered to patient (explain)?  Yes, Patient declined. She stated that her Lucianne Lei is stuck in her driveway, in the mud. She does think that she has someone who can go by [facility] and pick up any prescriptions.   Reason for call:  Patient stated that she was released from [facility] hospital 02/21/18, for COPD.  She is complaining of increased SHOB, with exertion. She stated that she is on O2, at 4 Liters.  She has used her nebulizer, with her nebs, but she could not tell what nebs she is on or what she has taken.  She stated she uses 4 types of nebs.  She did say that she could not take prednisone, because it caused hallucinations.  I asked about pursed lip breathing, and rest, when [facility].  She stated that she sits down and breathes in her nose and out her mouth, but gets [facility] as soon as she has any exertion. She stated that she does not have any emergency inhaler, because Medicaid would not cover it. Per her med list she has Albuterol nebs, duo nebs, pulmicort, and brovana.  Prednisone taper is also listed, started 02/19/18.     Will route message to Wyn Quaker, NP

## 2018-02-25 NOTE — Telephone Encounter (Signed)
I am sorry to hear the patient is not feeling well.  Due to the confusion regarding her nebulizers as well as whether or not she is started her prednisone that was prescribed after discharge from [facility].  Patient does need hospital follow-up with our office.  I would also encourage the patient has a hospital follow-up with primary care.  My recommendations are that she presents to our office for further evaluation or to the emergency room for further evaluation.  Wyn Quaker, FNP

## 2018-02-26 NOTE — Telephone Encounter (Signed)
lmom 

## 2018-02-27 NOTE — Telephone Encounter (Signed)
Spoke with the pt and notified of recs per Aaron Edelman  She verbalized understanding  The soonest she can come in due to transportation issues is 03/01/2018 so appt scheduled for then  She is aware to seek emergent care sooner if needed

## 2018-03-01 ENCOUNTER — Ambulatory Visit: Payer: Medicare Other | Admitting: Primary Care

## 2018-03-01 NOTE — Progress Notes (Deleted)
@Patient  ID: [name], female    DOB: September 22, 1950, 68 y.o.   MRN: [id number]  No chief complaint on file.   Referring provider: [name], NP  HPI: 68 year old female, current smoker. PMH significant for COPD, acute on chronic respiratory failure. Patient of Dr. Lake Bells, last seen on 10/04/17. Recently she has been hospitalized in January and February for COPD exacerbation.   Called on 2/17 with complaints of "gasping" and shortness of breath on exertion. Advised she needed an office visit but she did not have transportation. Aware if symptoms worsen to call 911. She has recently canceled apts with both cardiology and primary care this month.   03/04/2018 Patient presents today for an acute visit with complaints     Allergies  Allergen Reactions  . Dilaudid [Hydromorphone Hcl] Hives and Nausea Only  . Minocycline Hcl     REACTION: Dizzy  . Prednisone     REACTION: feels like throat swelling, hallucinations  . Varenicline Tartrate     REACTION: Dizzy(chantix)   . Zocor [Simvastatin - High Dose] Other (See Comments)    myalgia    Immunization History  Administered Date(s) Administered  . Influenza Split 10/05/2010  . Influenza Whole 11/08/2005, 10/09/2011  . Influenza, High Dose Seasonal PF 09/16/2014, 09/06/2016, 09/07/2017  . Influenza,inj,Quad PF,6+ Mos 09/09/2012  . Influenza-Unspecified 09/10/2015  . Pneumococcal Conjugate-13 03/30/2014  . Pneumococcal Polysaccharide-23 11/08/2005, 04/01/2011, 09/06/2016  . Td 09/06/2004    Past Medical History:  Diagnosis Date  . Acute MI ([place])    x4, code blue x3  . Arteriosclerotic cardiovascular disease (ASCVD) 2002   Inf STEMI-2002. 2003-cutting balloon + brachytherapy for restenosis; subsequent acute stent thrombosis 06/2010 requiring 2 separate interventions (Zeta stent, then repeat cath with thrombectomy). focal basal inf AK, nl EF; 03/2011: Patent stents, minor nonobst  residual dz, nl EF; neg stress nuclear in  2008 and stress echo in 2009  . Chronic anticoagulation    Warfarin plus ticagrelor  . Chronic respiratory failure ([place]) 08/27/2013   On 2L 02  . COPD (chronic obstructive pulmonary disease) ([place])    02 dependent  . COPD (chronic obstructive pulmonary disease) ([place])   . Factor 5 Leiden mutation, heterozygous ([employer])   . Factor V Leiden, prothrombin gene mutation ([place]) 2006  . Hyperlipidemia   . Hypothyroidism   . Noncompliance   . Pelvic fracture ([place]) 2009  . Pulmonary embolism ([place]) 2006   Associated with deep vein thrombosis-2006; + factor V Leiden  . Tobacco abuse    50 pack years    Tobacco History: Social History   Tobacco Use  Smoking Status Current Some Day Smoker  . Packs/day: 0.50  . Years: 50.00  . Pack years: 25.00  . Types: Cigarettes  . Start date: 05/10/1968  Smokeless Tobacco Never Used   Ready to quit: Not Answered Counseling given: Not Answered   Outpatient Medications Prior to Visit  Medication Sig Dispense Refill  . albuterol (PROVENTIL) (2.5 MG/3ML) 0.083% nebulizer solution USE 1 VIAL BY NEBULIZER EVERY 4 HOURS AS NEEDED FOR WHEEZING. DX: J44.9 (Patient taking differently: Take 2.5 mg by nebulization every 4 (four) hours as needed for wheezing or shortness of breath. ) 375 vial 0  . arformoterol (BROVANA) 15 MCG/2ML NEBU Take 2 mLs (15 mcg total) by nebulization 2 (two) times daily. 120 mL 6  . baclofen (LIORESAL) 10 MG tablet Take 1 tablet (10 mg total) by mouth 3 (three) times daily. 15 tablet 0  . budesonide (PULMICORT) 0.5  MG/2ML nebulizer solution Take 2 mLs (0.5 mg total) by nebulization 2 (two) times daily. Dx: J43.9 120 mL 3  . buPROPion (WELLBUTRIN SR) 100 MG 12 hr tablet Take 100 mg by mouth every morning.    . fentaNYL (DURAGESIC) 50 MCG/HR Place 1 patch onto the skin every 3 (three) days. 3 patch 0  . ferrous sulfate 324 (65 Fe) MG TBEC Take 1 tablet (325 mg total) by mouth daily with breakfast. 30 tablet   . ferrous sulfate 325 (65 FE) MG  tablet Take 325 mg by mouth daily with breakfast.    . folic acid (FOLVITE) 1 MG tablet Take 1 tablet (1 mg total) by mouth daily.  3  . HYDROcodone-acetaminophen (NORCO/VICODIN) 5-325 MG tablet Take 1 tablet by mouth every 6 (six) hours as needed (BREAKTHROUGH PAIN). 10 tablet 0  . ipratropium-albuterol (DUONEB) 0.5-2.5 (3) MG/3ML SOLN Take 3 mLs by nebulization 4 (four) times daily. 360 mL 3  . levothyroxine (SYNTHROID, LEVOTHROID) 125 MCG tablet Take 1 tablet (125 mcg total) by mouth daily. 90 tablet 3  . lidocaine (LIDODERM) 5 % Place 1 patch onto the skin daily. Remove & Discard patch within 12 hours or as directed by MD 30 patch 0  . nitroGLYCERIN (NITROSTAT) 0.4 MG SL tablet Place 1 tablet (0.4 mg total) under the tongue every 5 (five) minutes as needed for chest pain. 25 tablet 3  . Omega-3 Fatty Acids (FISH OIL) 600 MG CAPS Take 1 capsule by mouth daily.     . OXYGEN Inhale 3-4 L into the lungs continuous. 3L continuous and 4L with exertion    . pantoprazole (PROTONIX) 40 MG tablet Take 1 tablet (40 mg total) by mouth daily.    . polyethylene glycol (MIRALAX / GLYCOLAX) packet Take 17 g by mouth daily. (Patient not taking: Reported on 02/15/2018) 14 each 0  . predniSONE (DELTASONE) 10 MG tablet Take 6 PO QAM x5days, 4 PO QAM x3days, 4 PO QAM x3days, 2 PO QAM x 3 days, 1 PO QD x 3 days    . Respiratory Therapy Supplies (FLUTTER) DEVI Use as directed 1 each 0  . ticagrelor (BRILINTA) 60 MG TABS tablet Take 1 tablet (60 mg total) by mouth 2 (two) times daily.    . traZODone (DESYREL) 50 MG tablet Take 50 mg by mouth at bedtime as needed for sleep.    [name] warfarin (COUMADIN) 1 MG tablet Take 1 mg by mouth See admin instructions. Alternates taking 6mg  with 5mg  daily. Takes 5mg  on Mondays, Wednesdays, and Fridays. Takes 6mg  on all other days    . warfarin (COUMADIN) 5 MG tablet Take 5 mg by mouth See admin instructions. Alternates taking 6mg  with 5mg  daily. Takes 5mg  on Mondays, Wednesdays, and  Fridays. Takes 6mg  on all other days     No facility-administered medications prior to visit.       Review of Systems  Review of Systems   Physical Exam  There were no vitals taken for this visit. Physical Exam   Lab Results:  CBC    Component Value Date/Time   WBC 7.1 02/19/2018 0512   RBC 3.36 (L) 02/19/2018 0512   HGB 9.8 (L) 02/19/2018 0512   HCT 32.4 (L) 02/19/2018 0512   PLT 300 02/19/2018 0512   MCV 96.4 02/19/2018 0512   MCH 29.2 02/19/2018 0512   MCHC 30.2 02/19/2018 0512   RDW 16.7 (H) 02/19/2018 0512   LYMPHSABS 0.7 01/31/2018 0618   MONOABS 0.4 01/31/2018 2952  EOSABS 0.0 01/31/2018 0618   BASOSABS 0.1 01/31/2018 0618    BMET    Component Value Date/Time   NA 138 02/18/2018 0630   K 4.5 02/18/2018 0630   CL 98 02/18/2018 0630   CO2 32 02/18/2018 0630   GLUCOSE 146 (H) 02/18/2018 0630   BUN 24 (H) 02/18/2018 0630   CREATININE 0.78 02/18/2018 0630   CALCIUM 8.1 (L) 02/18/2018 0630   GFRNONAA >60 02/18/2018 0630   GFRAA >60 02/18/2018 0630    BNP    Component Value Date/Time   BNP 161.0 (H) 01/28/2018 2330    ProBNP    Component Value Date/Time   PROBNP <30.0 04/14/2009 0430    Imaging: Dg Chest 2 View  Result Date: 02/15/2018 CLINICAL DATA:  Shortness of breath. EXAM: CHEST - 2 VIEW COMPARISON:  02/02/2018.  01/28/2018.  08/27/2013.  CT 11/14/2017. FINDINGS: Mediastinum hilar structures normal. Stable cardiomegaly with normal pulmonary vascularity. Chronic interstitial changes consistent chronic interstitial lung disease and bilateral pleural-parenchymal thickening consistent scarring again noted. Similar findings noted on prior exams. Superimposed active interstitial lung disease can not be completely excluded. No pneumothorax. IMPRESSION: 1. Chronic interstitial lung disease again noted. Similar findings noted on multiple prior exams. Superimposed acute active interstitial disease can not be excluded. Bilateral pleural-parenchymal  scarring. 2.  Stable cardiomegaly. Electronically Signed   By: Marcello Moores  Register   On: 02/15/2018 17:20   US Venous Img Lower Bilateral  Result Date: 02/17/2018 CLINICAL DATA:  Shortness of breath bilateral leg edema. EXAM: BILATERAL LOWER EXTREMITY VENOUS DOPPLER ULTRASOUND TECHNIQUE: Gray-scale sonography with graded compression, as well as color Doppler and duplex ultrasound were performed to evaluate the lower extremity deep venous systems from the level of the common femoral vein and including the common femoral, femoral, profunda femoral, popliteal and calf veins including the posterior tibial, peroneal and gastrocnemius veins when visible. The superficial great saphenous vein was also interrogated. Spectral Doppler was utilized to evaluate flow at rest and with distal augmentation maneuvers in the common femoral, femoral and popliteal veins. COMPARISON:  None. FINDINGS: RIGHT LOWER EXTREMITY Common Femoral Vein: No evidence of thrombus. Normal compressibility, respiratory phasicity and response to augmentation. Saphenofemoral Junction: No evidence of thrombus. Normal compressibility and flow on color Doppler imaging. Profunda Femoral Vein: No evidence of thrombus. Normal compressibility and flow on color Doppler imaging. Femoral Vein: No evidence of thrombus. Normal compressibility, respiratory phasicity and response to augmentation. Popliteal Vein: No evidence of thrombus. Normal compressibility, respiratory phasicity and response to augmentation. Calf Veins: No evidence of thrombus. Normal compressibility and flow on color Doppler imaging. Superficial Great Saphenous Vein: No evidence of thrombus. Normal compressibility. Venous Reflux:  None. Other Findings:  Subcutaneous edema along the right ankle. LEFT LOWER EXTREMITY Common Femoral Vein: No evidence of thrombus. Normal compressibility, respiratory phasicity and response to augmentation. Saphenofemoral Junction: No evidence of thrombus. Normal  compressibility and flow on color Doppler imaging. Profunda Femoral Vein: No evidence of thrombus. Normal compressibility and flow on color Doppler imaging. Femoral Vein: No evidence of thrombus. Normal compressibility, respiratory phasicity and response to augmentation. Popliteal Vein: No evidence of thrombus. Normal compressibility, respiratory phasicity and response to augmentation. Calf Veins: No evidence of thrombus. Normal compressibility and flow on color Doppler imaging. Superficial Great Saphenous Vein: No evidence of thrombus. Normal compressibility. Venous Reflux:  None. Other Findings:  Subcutaneous edema along the left ankle. IMPRESSION: 1. No findings of deep vein thrombosis of either lower extremity. 2. Bilateral subcutaneous edema along the ankles, right greater  than left. Electronically Signed   By: Van Clines M.D.   On: 02/17/2018 10:43     Assessment & Plan:   No problem-specific Assessment & Plan notes found for this encounter.     Martyn Ehrich, NP 03/01/2018

## 2018-03-04 ENCOUNTER — Ambulatory Visit: Payer: Medicare Other | Admitting: Primary Care

## 2018-03-10 ENCOUNTER — Other Ambulatory Visit: Payer: Self-pay | Admitting: Cardiovascular Disease

## 2018-03-14 ENCOUNTER — Encounter: Payer: Self-pay | Admitting: Family Medicine

## 2018-04-11 ENCOUNTER — Telehealth: Payer: Self-pay | Admitting: *Deleted

## 2018-04-11 NOTE — Telephone Encounter (Signed)
Jenny Reichmann, have you seen this?

## 2018-04-11 NOTE — Telephone Encounter (Signed)
Copied from [place] [phone]. Topic: General - Other >> Apr 11, 2018  2:03 PM Rayann Heman wrote: Reason for CRM: Caryl Pina calling from [place] called and stated he faxed over an order for home INR check. He would like to follow up on this. Please advise

## 2018-04-12 NOTE — Telephone Encounter (Signed)
No.  I have not seen this order. Can you please see if you can locate the fax and send it to me?  Thanks.

## 2018-04-15 NOTE — Telephone Encounter (Signed)
Spoke to [place] and advised that pt is not managed here for coumadin.  He will contact cardiology.

## 2018-05-06 DIAGNOSIS — Z86718 Personal history of other venous thrombosis and embolism: Secondary | ICD-10-CM | POA: Diagnosis not present

## 2018-05-06 DIAGNOSIS — Z7901 Long term (current) use of anticoagulants: Secondary | ICD-10-CM | POA: Diagnosis not present

## 2018-05-06 LAB — POCT INR: INR: 1.7 — AB (ref 2.0–3.0)

## 2018-05-07 ENCOUNTER — Ambulatory Visit (INDEPENDENT_AMBULATORY_CARE_PROVIDER_SITE_OTHER): Payer: Medicare Other | Admitting: General Practice

## 2018-05-07 ENCOUNTER — Telehealth: Payer: Self-pay | Admitting: General Practice

## 2018-05-07 DIAGNOSIS — Z86711 Personal history of pulmonary embolism: Secondary | ICD-10-CM

## 2018-05-07 DIAGNOSIS — Z5181 Encounter for therapeutic drug level monitoring: Secondary | ICD-10-CM

## 2018-05-07 NOTE — Telephone Encounter (Signed)
Incoming fax from [place] - phone# [phone]- on patient with INR of 1.7.  Unable to reach patient by phone and also unable to reach any of her contacts. I contacted the home monitoring company and they were unable to give me any other contact information.

## 2018-05-23 ENCOUNTER — Encounter (HOSPITAL_COMMUNITY): Payer: Self-pay | Admitting: Emergency Medicine

## 2018-05-23 ENCOUNTER — Inpatient Hospital Stay (HOSPITAL_COMMUNITY)
Admission: EM | Admit: 2018-05-23 | Discharge: 2018-05-29 | DRG: 190 | Disposition: A | Discharge status: Home Health | Payer: Medicare Other | Attending: Internal Medicine | Admitting: Internal Medicine

## 2018-05-23 ENCOUNTER — Other Ambulatory Visit: Payer: Self-pay

## 2018-05-23 ENCOUNTER — Emergency Department (HOSPITAL_COMMUNITY): Payer: Medicare Other

## 2018-05-23 DIAGNOSIS — K228 Other specified diseases of esophagus: Secondary | ICD-10-CM | POA: Diagnosis not present

## 2018-05-23 DIAGNOSIS — D682 Hereditary deficiency of other clotting factors: Secondary | ICD-10-CM | POA: Diagnosis present

## 2018-05-23 DIAGNOSIS — Z79890 Hormone replacement therapy (postmenopausal): Secondary | ICD-10-CM

## 2018-05-23 DIAGNOSIS — J9600 Acute respiratory failure, unspecified whether with hypoxia or hypercapnia: Secondary | ICD-10-CM | POA: Diagnosis not present

## 2018-05-23 DIAGNOSIS — Z7951 Long term (current) use of inhaled steroids: Secondary | ICD-10-CM | POA: Diagnosis not present

## 2018-05-23 DIAGNOSIS — I2510 Atherosclerotic heart disease of native coronary artery without angina pectoris: Secondary | ICD-10-CM | POA: Diagnosis not present

## 2018-05-23 DIAGNOSIS — R069 Unspecified abnormalities of breathing: Secondary | ICD-10-CM | POA: Diagnosis not present

## 2018-05-23 DIAGNOSIS — F32A Depression, unspecified: Secondary | ICD-10-CM | POA: Diagnosis present

## 2018-05-23 DIAGNOSIS — J9622 Acute and chronic respiratory failure with hypercapnia: Secondary | ICD-10-CM | POA: Diagnosis not present

## 2018-05-23 DIAGNOSIS — J441 Chronic obstructive pulmonary disease with (acute) exacerbation: Secondary | ICD-10-CM | POA: Diagnosis not present

## 2018-05-23 DIAGNOSIS — Z9981 Dependence on supplemental oxygen: Secondary | ICD-10-CM

## 2018-05-23 DIAGNOSIS — E785 Hyperlipidemia, unspecified: Secondary | ICD-10-CM | POA: Diagnosis present

## 2018-05-23 DIAGNOSIS — F17210 Nicotine dependence, cigarettes, uncomplicated: Secondary | ICD-10-CM

## 2018-05-23 DIAGNOSIS — K921 Melena: Secondary | ICD-10-CM | POA: Diagnosis not present

## 2018-05-23 DIAGNOSIS — G894 Chronic pain syndrome: Secondary | ICD-10-CM | POA: Diagnosis present

## 2018-05-23 DIAGNOSIS — Z1159 Encounter for screening for other viral diseases: Secondary | ICD-10-CM | POA: Diagnosis not present

## 2018-05-23 DIAGNOSIS — J80 Acute respiratory distress syndrome: Secondary | ICD-10-CM | POA: Diagnosis not present

## 2018-05-23 DIAGNOSIS — Z7901 Long term (current) use of anticoagulants: Secondary | ICD-10-CM

## 2018-05-23 DIAGNOSIS — E039 Hypothyroidism, unspecified: Secondary | ICD-10-CM | POA: Diagnosis present

## 2018-05-23 DIAGNOSIS — R5381 Other malaise: Secondary | ICD-10-CM | POA: Diagnosis not present

## 2018-05-23 DIAGNOSIS — Z8249 Family history of ischemic heart disease and other diseases of the circulatory system: Secondary | ICD-10-CM

## 2018-05-23 DIAGNOSIS — D6851 Activated protein C resistance: Secondary | ICD-10-CM | POA: Diagnosis present

## 2018-05-23 DIAGNOSIS — D62 Acute posthemorrhagic anemia: Secondary | ICD-10-CM | POA: Diagnosis present

## 2018-05-23 DIAGNOSIS — Z7401 Bed confinement status: Secondary | ICD-10-CM | POA: Diagnosis not present

## 2018-05-23 DIAGNOSIS — R0689 Other abnormalities of breathing: Secondary | ICD-10-CM | POA: Diagnosis not present

## 2018-05-23 DIAGNOSIS — R0602 Shortness of breath: Secondary | ICD-10-CM

## 2018-05-23 DIAGNOSIS — E440 Moderate protein-calorie malnutrition: Secondary | ICD-10-CM

## 2018-05-23 DIAGNOSIS — Z86711 Personal history of pulmonary embolism: Secondary | ICD-10-CM | POA: Diagnosis not present

## 2018-05-23 DIAGNOSIS — L89312 Pressure ulcer of right buttock, stage 2: Secondary | ICD-10-CM | POA: Diagnosis present

## 2018-05-23 DIAGNOSIS — K635 Polyp of colon: Secondary | ICD-10-CM | POA: Diagnosis present

## 2018-05-23 DIAGNOSIS — F17200 Nicotine dependence, unspecified, uncomplicated: Secondary | ICD-10-CM | POA: Diagnosis not present

## 2018-05-23 DIAGNOSIS — Z86718 Personal history of other venous thrombosis and embolism: Secondary | ICD-10-CM | POA: Diagnosis not present

## 2018-05-23 DIAGNOSIS — F329 Major depressive disorder, single episode, unspecified: Secondary | ICD-10-CM | POA: Diagnosis not present

## 2018-05-23 DIAGNOSIS — I252 Old myocardial infarction: Secondary | ICD-10-CM | POA: Diagnosis not present

## 2018-05-23 DIAGNOSIS — K5900 Constipation, unspecified: Secondary | ICD-10-CM | POA: Diagnosis present

## 2018-05-23 DIAGNOSIS — K5731 Diverticulosis of large intestine without perforation or abscess with bleeding: Secondary | ICD-10-CM | POA: Diagnosis present

## 2018-05-23 DIAGNOSIS — J9620 Acute and chronic respiratory failure, unspecified whether with hypoxia or hypercapnia: Secondary | ICD-10-CM | POA: Diagnosis present

## 2018-05-23 DIAGNOSIS — K644 Residual hemorrhoidal skin tags: Secondary | ICD-10-CM | POA: Diagnosis not present

## 2018-05-23 DIAGNOSIS — D123 Benign neoplasm of transverse colon: Secondary | ICD-10-CM | POA: Diagnosis not present

## 2018-05-23 DIAGNOSIS — K5730 Diverticulosis of large intestine without perforation or abscess without bleeding: Secondary | ICD-10-CM | POA: Diagnosis not present

## 2018-05-23 DIAGNOSIS — Z9861 Coronary angioplasty status: Secondary | ICD-10-CM | POA: Diagnosis not present

## 2018-05-23 DIAGNOSIS — J9621 Acute and chronic respiratory failure with hypoxia: Secondary | ICD-10-CM | POA: Diagnosis not present

## 2018-05-23 DIAGNOSIS — R531 Weakness: Secondary | ICD-10-CM | POA: Diagnosis not present

## 2018-05-23 DIAGNOSIS — L89159 Pressure ulcer of sacral region, unspecified stage: Secondary | ICD-10-CM | POA: Diagnosis present

## 2018-05-23 DIAGNOSIS — T380X5A Adverse effect of glucocorticoids and synthetic analogues, initial encounter: Secondary | ICD-10-CM | POA: Diagnosis present

## 2018-05-23 DIAGNOSIS — F419 Anxiety disorder, unspecified: Secondary | ICD-10-CM | POA: Diagnosis present

## 2018-05-23 DIAGNOSIS — Z79899 Other long term (current) drug therapy: Secondary | ICD-10-CM

## 2018-05-23 DIAGNOSIS — D120 Benign neoplasm of cecum: Secondary | ICD-10-CM | POA: Diagnosis not present

## 2018-05-23 DIAGNOSIS — D649 Anemia, unspecified: Secondary | ICD-10-CM | POA: Diagnosis not present

## 2018-05-23 DIAGNOSIS — F17209 Nicotine dependence, unspecified, with unspecified nicotine-induced disorders: Secondary | ICD-10-CM

## 2018-05-23 DIAGNOSIS — K6289 Other specified diseases of anus and rectum: Secondary | ICD-10-CM | POA: Diagnosis not present

## 2018-05-23 LAB — CBC WITH DIFFERENTIAL/PLATELET
Abs Immature Granulocytes: 0.02 10*3/uL (ref 0.00–0.07)
Basophils Absolute: 0 10*3/uL (ref 0.0–0.1)
Basophils Relative: 0 %
Eosinophils Absolute: 0 10*3/uL (ref 0.0–0.5)
Eosinophils Relative: 0 %
HCT: 22.3 % — ABNORMAL LOW (ref 36.0–46.0)
Hemoglobin: 6.6 g/dL — CL (ref 12.0–15.0)
Immature Granulocytes: 0 %
Lymphocytes Relative: 18 %
Lymphs Abs: 0.8 10*3/uL (ref 0.7–4.0)
MCH: 28.7 pg (ref 26.0–34.0)
MCHC: 29.6 g/dL — ABNORMAL LOW (ref 30.0–36.0)
MCV: 97 fL (ref 80.0–100.0)
Monocytes Absolute: 0.5 10*3/uL (ref 0.1–1.0)
Monocytes Relative: 10 %
Neutro Abs: 3.3 10*3/uL (ref 1.7–7.7)
Neutrophils Relative %: 72 %
Platelets: 203 10*3/uL (ref 150–400)
RBC: 2.3 MIL/uL — ABNORMAL LOW (ref 3.87–5.11)
RDW: 16 % — ABNORMAL HIGH (ref 11.5–15.5)
WBC: 4.7 10*3/uL (ref 4.0–10.5)
nRBC: 0 % (ref 0.0–0.2)

## 2018-05-23 LAB — URINALYSIS, ROUTINE W REFLEX MICROSCOPIC
Bilirubin Urine: NEGATIVE
Glucose, UA: NEGATIVE mg/dL
Ketones, ur: NEGATIVE mg/dL
Leukocytes,Ua: NEGATIVE
Nitrite: NEGATIVE
Protein, ur: 30 mg/dL — AB
Specific Gravity, Urine: 1.014 (ref 1.005–1.030)
pH: 7 (ref 5.0–8.0)

## 2018-05-23 LAB — PROTIME-INR
INR: 5 (ref 0.8–1.2)
Prothrombin Time: 45.6 seconds — ABNORMAL HIGH (ref 11.4–15.2)

## 2018-05-23 LAB — SARS CORONAVIRUS 2 BY RT PCR (HOSPITAL ORDER, PERFORMED IN ~~LOC~~ HOSPITAL LAB): SARS Coronavirus 2: NEGATIVE

## 2018-05-23 LAB — BASIC METABOLIC PANEL
Anion gap: 8 (ref 5–15)
BUN: 19 mg/dL (ref 8–23)
CO2: 39 mmol/L — ABNORMAL HIGH (ref 22–32)
Calcium: 7.8 mg/dL — ABNORMAL LOW (ref 8.9–10.3)
Chloride: 96 mmol/L — ABNORMAL LOW (ref 98–111)
Creatinine, Ser: 0.6 mg/dL (ref 0.44–1.00)
GFR calc Af Amer: >60 mL/min (ref >60)
GFR calc non Af Amer: >60 mL/min (ref >60)
Glucose, Bld: 94 mg/dL (ref 70–99)
Potassium: 3.5 mmol/L (ref 3.5–5.1)
Sodium: 143 mmol/L (ref 135–145)

## 2018-05-23 LAB — RAPID URINE DRUG SCREEN, HOSP PERFORMED
Amphetamines: NOT DETECTED
Barbiturates: NOT DETECTED
Benzodiazepines: NOT DETECTED
Cocaine: NOT DETECTED
Opiates: POSITIVE — AB
Tetrahydrocannabinol: NOT DETECTED

## 2018-05-23 LAB — BLOOD GAS, ARTERIAL
Acid-Base Excess: 14 mmol/L — ABNORMAL HIGH (ref 0.0–2.0)
Bicarbonate: 36.7 mmol/L — ABNORMAL HIGH (ref 20.0–28.0)
FIO2: 28
O2 Saturation: 95.1 %
Patient temperature: 36.9
pCO2 arterial: 81.1 mmHg (ref 32.0–48.0)
pH, Arterial: 7.32 — ABNORMAL LOW (ref 7.350–7.450)
pO2, Arterial: 75.9 mmHg — ABNORMAL LOW (ref 83.0–108.0)

## 2018-05-23 LAB — LACTIC ACID, PLASMA
Lactic Acid, Venous: 0.6 mmol/L (ref 0.5–1.9)
Lactic Acid, Venous: 1 mmol/L (ref 0.5–1.9)

## 2018-05-23 LAB — POC OCCULT BLOOD, ED: Fecal Occult Bld: POSITIVE — AB

## 2018-05-23 LAB — BRAIN NATRIURETIC PEPTIDE: B Natriuretic Peptide: 80 pg/mL (ref 0.0–100.0)

## 2018-05-23 LAB — TROPONIN I: Troponin I: <0.03 ng/mL (ref <0.03)

## 2018-05-23 LAB — ETHANOL: Alcohol, Ethyl (B): <10 mg/dL (ref <10)

## 2018-05-23 MED ORDER — TRAZODONE HCL 50 MG PO TABS
50.0000 mg | ORAL_TABLET | Freq: Every evening | ORAL | Status: DC | PRN
  Administered 2018-05-23: 50 mg via ORAL
  Filled 2018-05-23: qty 1

## 2018-05-23 MED ORDER — ALBUTEROL (5 MG/ML) CONTINUOUS INHALATION SOLN
10.0000 mg/h | INHALATION_SOLUTION | Freq: Once | RESPIRATORY_TRACT | Status: AC
  Administered 2018-05-23: 10 mg/h via RESPIRATORY_TRACT
  Filled 2018-05-23: qty 20

## 2018-05-23 MED ORDER — FERROUS SULFATE 325 (65 FE) MG PO TABS
325.0000 mg | ORAL_TABLET | Freq: Every day | ORAL | Status: DC
  Administered 2018-05-24: 325 mg via ORAL
  Filled 2018-05-23: qty 1

## 2018-05-23 MED ORDER — SODIUM CHLORIDE 0.9 % IV BOLUS: 1000.0000 mL | Freq: Once | INTRAVENOUS | Status: DC

## 2018-05-23 MED ORDER — IPRATROPIUM BROMIDE 0.02 % IN SOLN
1.0000 mg | Freq: Once | RESPIRATORY_TRACT | Status: AC
  Administered 2018-05-23: 1 mg via RESPIRATORY_TRACT
  Filled 2018-05-23: qty 5

## 2018-05-23 MED ORDER — BUPROPION HCL ER (SR) 100 MG PO TB12
100.0000 mg | ORAL_TABLET | Freq: Every morning | ORAL | Status: DC
  Administered 2018-05-24 – 2018-05-29 (×5): 100 mg via ORAL
  Filled 2018-05-23 (×5): qty 1

## 2018-05-23 MED ORDER — FOLIC ACID 1 MG PO TABS
1.0000 mg | ORAL_TABLET | Freq: Every day | ORAL | Status: DC
  Administered 2018-05-23 – 2018-05-29 (×6): 1 mg via ORAL
  Filled 2018-05-23 (×6): qty 1

## 2018-05-23 MED ORDER — NALOXONE HCL 0.4 MG/ML IJ SOLN
0.4000 mg | Freq: Once | INTRAMUSCULAR | Status: AC
  Administered 2018-05-23: 0.4 mg via INTRAVENOUS
  Filled 2018-05-23: qty 1

## 2018-05-23 MED ORDER — BUDESONIDE 0.5 MG/2ML IN SUSP
0.5000 mg | Freq: Two times a day (BID) | RESPIRATORY_TRACT | Status: DC
  Administered 2018-05-23 – 2018-05-29 (×12): 0.5 mg via RESPIRATORY_TRACT
  Filled 2018-05-23 (×12): qty 2

## 2018-05-23 MED ORDER — TICAGRELOR 60 MG PO TABS: 60.0000 mg | ORAL_TABLET | Freq: Two times a day (BID) | ORAL | Status: DC

## 2018-05-23 MED ORDER — IPRATROPIUM-ALBUTEROL 0.5-2.5 (3) MG/3ML IN SOLN
3.0000 mL | Freq: Four times a day (QID) | RESPIRATORY_TRACT | Status: DC
  Administered 2018-05-23 – 2018-05-27 (×17): 3 mL via RESPIRATORY_TRACT
  Filled 2018-05-23 (×17): qty 3

## 2018-05-23 MED ORDER — OMEGA-3-ACID ETHYL ESTERS 1 G PO CAPS
1.0000 | ORAL_CAPSULE | Freq: Every day | ORAL | Status: DC
  Administered 2018-05-24 – 2018-05-29 (×5): 1 g via ORAL
  Filled 2018-05-23 (×6): qty 1

## 2018-05-23 MED ORDER — HYDROCODONE-ACETAMINOPHEN 5-325 MG PO TABS
1.0000 | ORAL_TABLET | Freq: Three times a day (TID) | ORAL | Status: DC | PRN
  Administered 2018-05-23 – 2018-05-28 (×5): 1 via ORAL
  Filled 2018-05-23 (×5): qty 1

## 2018-05-23 MED ORDER — CITALOPRAM HYDROBROMIDE 20 MG PO TABS
10.0000 mg | ORAL_TABLET | Freq: Every day | ORAL | Status: DC
  Administered 2018-05-23 – 2018-05-29 (×6): 10 mg via ORAL
  Filled 2018-05-23 (×6): qty 1

## 2018-05-23 MED ORDER — ARFORMOTEROL TARTRATE 15 MCG/2ML IN NEBU
15.0000 ug | INHALATION_SOLUTION | Freq: Two times a day (BID) | RESPIRATORY_TRACT | Status: DC
  Administered 2018-05-24 – 2018-05-29 (×11): 15 ug via RESPIRATORY_TRACT
  Filled 2018-05-23 (×11): qty 2

## 2018-05-23 MED ORDER — SODIUM CHLORIDE 0.9 % IV BOLUS
500.0000 mL | Freq: Once | INTRAVENOUS | Status: AC
  Administered 2018-05-23: 12:00:00 500 mL via INTRAVENOUS

## 2018-05-23 MED ORDER — METHYLPREDNISOLONE SODIUM SUCC 125 MG IJ SOLR
125.0000 mg | Freq: Once | INTRAMUSCULAR | Status: AC
  Administered 2018-05-23: 125 mg via INTRAVENOUS
  Filled 2018-05-23: qty 2

## 2018-05-23 MED ORDER — PANTOPRAZOLE SODIUM 40 MG IV SOLR
40.0000 mg | Freq: Two times a day (BID) | INTRAVENOUS | Status: DC
  Administered 2018-05-23 – 2018-05-28 (×10): 40 mg via INTRAVENOUS
  Filled 2018-05-23 (×10): qty 40

## 2018-05-23 MED ORDER — NICOTINE 21 MG/24HR TD PT24
21.0000 mg | MEDICATED_PATCH | Freq: Every day | TRANSDERMAL | Status: DC
  Administered 2018-05-23: 21 mg via TRANSDERMAL
  Filled 2018-05-23 (×6): qty 1

## 2018-05-23 MED ORDER — METHYLPREDNISOLONE SODIUM SUCC 125 MG IJ SOLR
60.0000 mg | Freq: Two times a day (BID) | INTRAMUSCULAR | Status: DC
  Administered 2018-05-23 – 2018-05-25 (×4): 60 mg via INTRAVENOUS
  Filled 2018-05-23 (×4): qty 2

## 2018-05-23 MED ORDER — ARFORMOTEROL TARTRATE 15 MCG/2ML IN NEBU
15.0000 ug | INHALATION_SOLUTION | Freq: Two times a day (BID) | RESPIRATORY_TRACT | Status: DC
  Administered 2018-05-23: 15 ug via RESPIRATORY_TRACT
  Filled 2018-05-23: qty 2

## 2018-05-23 MED ORDER — SODIUM CHLORIDE 0.9 % IV SOLN
INTRAVENOUS | Status: DC
  Administered 2018-05-23 – 2018-05-27 (×3): via INTRAVENOUS

## 2018-05-23 MED ORDER — DM-GUAIFENESIN ER 30-600 MG PO TB12
1.0000 | ORAL_TABLET | Freq: Two times a day (BID) | ORAL | Status: DC
  Administered 2018-05-24 – 2018-05-29 (×11): 1 via ORAL
  Filled 2018-05-23 (×11): qty 1

## 2018-05-23 MED ORDER — LEVOTHYROXINE SODIUM 25 MCG PO TABS
125.0000 ug | ORAL_TABLET | Freq: Every day | ORAL | Status: DC
  Administered 2018-05-24 – 2018-05-29 (×6): 125 ug via ORAL
  Filled 2018-05-23 (×6): qty 1

## 2018-05-23 MED ORDER — SODIUM CHLORIDE 0.9 % IV SOLN: 10.0000 mL/h | Freq: Once | INTRAVENOUS | Status: DC

## 2018-05-23 NOTE — ED Notes (Signed)
Patient states she is not wearing a fentanyl patch and I am unable to find one-she states it was removed yesterday.

## 2018-05-23 NOTE — H&P (Signed)
History and Physical    [name] VZD:[id number] DOB: 01-25-1950 DOA: 05/23/2018  Referring MD/NP/PA: Dr. Thurnell Garbe. PCP: [name], NP  Patient coming from: Home  Chief Complaint: Shortness of breath, increased fatigue and melanotic stools.  HPI: [name] is a 68 y.o. 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  Patient reports symptom has been present for the last 3 to 4 days and worsening.  She has used home nebulizer without significant improvement.  Patient continues to smoke.  Patient reported intermittent episode of productive cough and also expressed positive/worsening wheezing.  Patient reports intermittent episode of melanotic stools and ongoing sensation of feeling weak and tired at all times.  Patient denies fever, chills, nausea and vomiting; there has not been abdominal pain, no headaches, no chest pain, no diaphoresis, no focal weakness, no dysuria or hematuria. In the ED chest x-ray demonstrated chronic bronchitic/emphysematous changes without acute infiltrates. COVID-19 test neg. No acute ischemic changes on EKG, neg troponin and normal BNP. Hgb down to 6.6 and positive FOBT. ABG with compensated hypercapnia.  She received fluid resuscitation, type and cross with order to initiate transfusion, nebulizer treatment and started on BiPAP to assist with her shortness of breath and hypercapnia.  TRH has been called to admit patient for further evaluation and management of COPD exacerbation and GI bleed.   Past Medical/Surgical History: Past Medical History:  Diagnosis Date  . Acute MI ([place])    x4, code blue x3  . Arteriosclerotic cardiovascular  disease (ASCVD) 2002   Inf STEMI-2002. 2003-cutting balloon + brachytherapy for restenosis; subsequent acute stent thrombosis 06/2010 requiring 2 separate interventions (Zeta stent, then repeat cath with thrombectomy). focal basal inf AK, nl EF; 03/2011: Patent stents, minor nonobst  residual dz, nl EF; neg stress nuclear in 2008 and stress echo in 2009  . Chronic anticoagulation    Warfarin plus ticagrelor  . Chronic respiratory failure ([place]) 08/27/2013   On 2L 02  . COPD (chronic obstructive pulmonary disease) ([place])    02 dependent  . COPD (chronic obstructive pulmonary disease) ([place])   . Factor 5 Leiden mutation, heterozygous ([place])   . Factor V Leiden, prothrombin gene mutation ([place]) 2006  . Hyperlipidemia   . Hypothyroidism   . Noncompliance   . Pelvic fracture ([place]) 2009  . Pulmonary embolism ([place]) 2006   Associated with deep vein thrombosis-2006; + factor V Leiden  . Tobacco abuse    50 pack years    Past Surgical History:  Procedure Laterality Date  . COLONOSCOPY  Approximately 2000   Negative screening study  . CORONARY ANGIOPLASTY  2002, 2003, 2012  . LEFT AND RIGHT HEART CATHETERIZATION WITH CORONARY ANGIOGRAM N/A 04/03/2011   Procedure: LEFT AND RIGHT HEART CATHETERIZATION WITH CORONARY ANGIOGRAM;  Surgeon: Sherren Mocha, MD;  Location: [facility] CATH LAB;  Service: Cardiovascular;  Laterality: N/A;    Social History:  reports that she has been smoking cigarettes. She started smoking about 50 years ago. She has a 25.00 pack-year smoking history. She has never used smokeless tobacco. She reports that she does not drink alcohol or use drugs.  Allergies: Allergies  Allergen Reactions  . Dilaudid [Hydromorphone Hcl] Hives and Nausea Only  . Minocycline Hcl     REACTION: Dizzy  . Prednisone  REACTION: feels like throat swelling, hallucinations  . Varenicline Tartrate     REACTION: Dizzy(chantix)   . Zocor [Simvastatin - High Dose] Other (See Comments)    myalgia     Family History:  Family History  Problem Relation Age of Onset  . Emphysema Mother   . Alzheimer's disease Mother   . Emphysema Sister   . Heart disease Father   . Factor V Leiden deficiency Father   . Factor V Leiden deficiency Sister   . Factor V Leiden deficiency Daughter   . Kidney cancer Paternal Grandmother     Prior to Admission medications   Medication Sig Start Date End Date Taking? Authorizing Provider  albuterol (PROVENTIL) (2.5 MG/3ML) 0.083% nebulizer solution USE 1 VIAL BY NEBULIZER EVERY 4 HOURS AS NEEDED FOR WHEEZING. DX: J44.9 Patient taking differently: Take 2.5 mg by nebulization every 4 (four) hours as needed for wheezing or shortness of breath.  01/24/17  Yes Magdalen Spatz, NP  arformoterol (BROVANA) 15 MCG/2ML NEBU Take 2 mLs (15 mcg total) by nebulization 2 (two) times daily. 09/07/17  Yes Juanito Doom, MD  baclofen (LIORESAL) 10 MG tablet Take 1 tablet (10 mg total) by mouth 3 (three) times daily. 01/24/18  Yes Lavina Hamman, MD  budesonide (PULMICORT) 0.5 MG/2ML nebulizer solution Take 2 mLs (0.5 mg total) by nebulization 2 (two) times daily. Dx: J43.9 01/24/17  Yes Magdalen Spatz, NP  buPROPion [facility] SR) 100 MG 12 hr tablet Take 100 mg by mouth every morning.   Yes [provider]  citalopram (CELEXA) 10 MG tablet Take 1 tablet by mouth daily. 04/29/18  Yes [provider]  fentaNYL (DURAGESIC) 50 MCG/HR Place 1 patch onto the skin every 3 (three) days. 02/19/18  Yes Johnson, Clanford L, MD  ferrous sulfate 324 (65 Fe) MG TBEC Take 1 tablet (325 mg total) by mouth daily with breakfast. 02/21/18  Yes Johnson, Clanford L, MD  folic acid (FOLVITE) 1 MG tablet Take 1 tablet (1 mg total) by mouth daily. 02/21/18 02/21/19 Yes Johnson, Clanford L, MD  HYDROcodone-acetaminophen (NORCO/VICODIN) 5-325 MG tablet Take 1 tablet by mouth every 6 (six) hours as needed (BREAKTHROUGH PAIN). 02/19/18  Yes Johnson, Clanford L, MD  ipratropium-albuterol  (DUONEB) 0.5-2.5 (3) MG/3ML SOLN Take 3 mLs by nebulization 4 (four) times daily. 03/20/17  Yes Magdalen Spatz, NP  levothyroxine (SYNTHROID, LEVOTHROID) 125 MCG tablet Take 1 tablet (125 mcg total) by mouth daily. 08/16/17  Yes Nafziger, Tommi Rumps, NP  nitroGLYCERIN (NITROSTAT) 0.4 MG SL tablet Place 1 tablet (0.4 mg total) under the tongue every 5 (five) minutes as needed for chest pain. 10/28/14  Yes Herminio Commons, MD  Omega-3 Fatty Acids (FISH OIL) 600 MG CAPS Take 1 capsule by mouth daily.    Yes [provider]  Respiratory Therapy Supplies (FLUTTER) DEVI Use as directed 05/14/15  Yes Javier Glazier, MD  ticagrelor (BRILINTA) 60 MG TABS tablet Take 1 tablet (60 mg total) by mouth 2 (two) times daily. 02/01/18  Yes Johnson, Clanford L, MD  traZODone (DESYREL) 50 MG tablet Take 50 mg by mouth at bedtime as needed for sleep.   Yes [provider]  warfarin (COUMADIN) 1 MG tablet Take 1 mg by mouth See admin instructions. Alternates taking 6mg  with 5mg  daily. Takes 5mg  on Mondays, Wednesdays, and Fridays. Takes 6mg  on all other days   Yes [provider]  warfarin (COUMADIN) 5 MG tablet TAKE 1 TABLET BY MOUTH ONCE DAILY 03/11/18  Yes Herminio Commons, MD  lidocaine (LIDODERM) 5 % Place 1 patch onto the skin daily. Remove & Discard patch within 12 hours or as directed by MD Patient not taking: Reported on 05/23/2018 01/24/18   Lavina Hamman, MD  pantoprazole (PROTONIX) 40 MG tablet Take 1 tablet (40 mg total) by mouth daily. 02/20/18   Johnson, Clanford L, MD  polyethylene glycol (MIRALAX / GLYCOLAX) packet Take 17 g by mouth daily. Patient not taking: Reported on 02/15/2018 01/25/18   Lavina Hamman, MD    Review of Systems:  Negative except as otherwise mentioned in HPI.   Physical Exam: Vitals:   05/23/18 1700 05/23/18 1800 05/23/18 1815 05/23/18 1827  BP: (!) 114/47 (!) 111/53 117/60 (!) 117/51  Pulse: 68 70 80 73  Resp: 19 20 (!) 29 19  Temp:      TempSrc:       SpO2: 100% 100% 96% 100%  Weight:      Height:       Constitutional: Having difficulty speaking in full sentences, denies chest pain, no nausea, no vomiting.  Slightly somnolent on examination and wearing BiPAP. Eyes: PERRL, lids and conjunctivae normal, no icterus, no nystagmus. ENMT: Mucous membranes are moist. Posterior pharynx clear of any exudate or lesions. Neck: normal, supple, no masses, no thyromegaly, no JVD. Respiratory: Positive expiratory wheezing, decreased air movement bilaterally, positive rhonchi right; no using accessory muscles. Cardiovascular: Regular rate and rhythm, no murmurs / rubs / gallops. No extremity edema. 2+ pedal pulses. No carotid bruits.  Abdomen: no tenderness, no masses palpated. No hepatosplenomegaly. Bowel sounds positive.  Musculoskeletal: no clubbing / cyanosis. No joint deformity upper and lower extremities. Good ROM, no contractures. Normal muscle tone.  Skin: no rashes, lesions, ulcers. No induration Neurologic: CN 2-12 grossly intact. Sensation intact, DTR normal. Strength 5/5 in all 4.  Psychiatric: Normal judgment and insight. Alert and oriented x 3. Normal mood.    Labs on Admission: I have personally reviewed the following labs and imaging studies  CBC: Recent Labs  Lab 05/23/18 1027  WBC 4.7  NEUTROABS 3.3  HGB 6.6*  HCT 22.3*  MCV 97.0  PLT 324   Basic Metabolic Panel: Recent Labs  Lab 05/23/18 1027  NA 143  K 3.5  CL 96*  CO2 39*  GLUCOSE 94  BUN 19  CREATININE 0.60  CALCIUM 7.8*   GFR: Estimated Creatinine Clearance: 63 mL/min (by C-G formula based on SCr of 0.6 mg/dL).  Coagulation Profile: Recent Labs  Lab 05/23/18 1027  INR 5.0*   Cardiac Enzymes: Recent Labs  Lab 05/23/18 1027  TROPONINI <0.03   Urine analysis:    Component Value Date/Time   COLORURINE YELLOW 05/23/2018 0934   APPEARANCEUR CLEAR 05/23/2018 0934   LABSPEC 1.014 05/23/2018 0934   PHURINE 7.0 05/23/2018 0934   GLUCOSEU NEGATIVE  05/23/2018 0934   HGBUR SMALL (A) 05/23/2018 0934   BILIRUBINUR NEGATIVE 05/23/2018 0934   BILIRUBINUR n 09/21/2016 1432   KETONESUR NEGATIVE 05/23/2018 0934   PROTEINUR 30 (A) 05/23/2018 0934   UROBILINOGEN 0.2 09/21/2016 1432   UROBILINOGEN 0.2 04/13/2009 2025   NITRITE NEGATIVE 05/23/2018 0934   LEUKOCYTESUR NEGATIVE 05/23/2018 0934    Recent Results (from the past 240 hour(s))  SARS Coronavirus 2 (CEPHEID- Performed in [place] hospital lab), Hosp Order     Status: None   Collection Time: 05/23/18 10:00 AM  Result Value Ref Range Status   SARS Coronavirus 2 NEGATIVE NEGATIVE Final    Comment: (  NOTE) If result is NEGATIVE SARS-CoV-2 target nucleic acids are NOT DETECTED. The SARS-CoV-2 RNA is generally detectable in upper and lower  respiratory specimens during the acute phase of infection. The lowest  concentration of SARS-CoV-2 viral copies this assay can detect is 250  copies / mL. A negative result does not preclude SARS-CoV-2 infection  and should not be used as the sole basis for treatment or other  patient management decisions.  A negative result may occur with  improper specimen collection / handling, submission of specimen other  than nasopharyngeal swab, presence of viral mutation(s) within the  areas targeted by this assay, and inadequate number of viral copies  (<250 copies / mL). A negative result must be combined with clinical  observations, patient history, and epidemiological information. If result is POSITIVE SARS-CoV-2 target nucleic acids are DETECTED. The SARS-CoV-2 RNA is generally detectable in upper and lower  respiratory specimens dur ing the acute phase of infection.  Positive  results are indicative of active infection with SARS-CoV-2.  Clinical  correlation with patient history and other diagnostic information is  necessary to determine patient infection status.  Positive results do  not rule out bacterial infection or co-infection with other  viruses. If result is PRESUMPTIVE POSTIVE SARS-CoV-2 nucleic acids MAY BE PRESENT.   A presumptive positive result was obtained on the submitted specimen  and confirmed on repeat testing.  While 2019 novel coronavirus  (SARS-CoV-2) nucleic acids may be present in the submitted sample  additional confirmatory testing may be necessary for epidemiological  and / or clinical management purposes  to differentiate between  SARS-CoV-2 and other Sarbecovirus currently known to infect humans.  If clinically indicated additional testing with an alternate test  methodology [phone]) is advised. The SARS-CoV-2 RNA is generally  detectable in upper and lower respiratory sp ecimens during the acute  phase of infection. The expected result is Negative. Fact Sheet for Patients:  StrictlyIdeas.no Fact Sheet for Healthcare Providers: BankingDealers.co.za This test is not yet approved or cleared by the [place] FDA and has been authorized for detection and/or diagnosis of SARS-CoV-2 by FDA under an Emergency Use Authorization (EUA).  This EUA will remain in effect (meaning this test can be used) for the duration of the COVID-19 declaration under Section 564(b)(1) of the Act, 21 U.S.C. section 360bbb-3(b)(1), unless the authorization is terminated or revoked sooner. Performed at [facility], [address], [place], [place] [address]      Radiological Exams on Admission: Dg Chest Portable 1 View  Result Date: 05/23/2018 CLINICAL DATA:  Shortness of breath EXAM: PORTABLE CHEST 1 VIEW COMPARISON:  February 15, 2018 chest radiograph; chest CT November 14, 2017. FINDINGS: There is mild scarring in the left base. There remains interstitial thickening bilaterally. There is no frank edema or consolidation. Heart is slightly enlarged with pulmonary vascularity normal. No adenopathy. No bone lesions. IMPRESSION: Scarring left base. Interstitial thickening, stable.  Underlying emphysematous change better seen on prior CT. No edema or consolidation. Stable cardiac prominence. Emphysema (ICD10-J43.9). Electronically Signed   By: Lowella Grip III M.D.   On: 05/23/2018 09:47    EKG: Independently reviewed. Old Anterior infarct appreciated, poor R wave progression, normal sinus rhythm.  Assessment/Plan 1-acute on chronic respiratory failure ([place]): In the setting of COPD exacerbation with a component of hypercapnia and hypoxia; there is also a component of low hemoglobin playing a role in her shortness of breath symptoms. -Due to elevated CO2 on ABG; patient admitted to stepdown bed for  BiPAP usage and close monitoring of her respiratory status. -Patient has been started on IV steroids, nebulizer management, Pulmicort, Brovana, flutter valve and use of Mucinex. -Sputum cultures has been ordered -Wean BiPAP as tolerated and resume home oxygen supplementation. -for her anemia (see below) will provide blood transfusion and hold anticoagulants initially.  2-acute blood loss anemia/melena -Patient reporting melanotic stools prior to admission -Positive fecal occult blood test on presentation -Hemoglobin 3 g down from most recent baseline in the 9.4 range -Hemoglobin on admission 6.6 -Hold anticoagulation agents -Transfused 2 units of PRBCs -Follow hemoglobin trend -GI service has been consulted for endoscopic evaluation. -Clear liquid diets and PPI has been started.  3-Hypothyroidism -Continue Synthroid  4-TOBACCO ABUSE -I have discussed tobacco cessation with the patient.  I have counseled the patient regarding the negative impacts of continued tobacco use including but not limited to lung cancer, COPD, and cardiovascular disease.  I have discussed alternatives to tobacco and modalities that may help facilitate tobacco cessation including but not limited to biofeedback, hypnosis, and medications.  Total time spent with tobacco counseling was 4 minutes.  -Nicotine patch has been ordered.  5-factor V deficiency -Chronically has been using Coumadin -INR supratherapeutic on presentation -Hold Coumadin for now (due to high INR and concerns for ongoing GI bleed) -Follow hemoglobin trend. -SCDs for DVT prophylaxis.  6-Depression -No suicidal ideation or hallucination -Continue home antidepressant regimen (Celexa and bupropion).  7-Chronic pain syndrome -Chronically using fentanyl and Vicodin for breakthrough pain. -Giving hypercapnia and decrease respiratory effort Narcan was given. -No fentanyl patch found on patient's body. -Continue as needed Vicodin for now.   DVT prophylaxis: SCDs Code Status: Full code Family Communication: No family at bedside. Disposition Plan: Anticipate discharge back home once respiratory status stabilized and GI work-up completed. Consults called: Gastroenterology service Admission status: Inpatient, stepdown, length of stay more than 2 midnights.   Time Spent: 70 minutes  Barton Dubois MD [facility] Pager [phone]  05/23/2018, 6:34 PM

## 2018-05-23 NOTE — ED Notes (Signed)
CRITICAL VALUE ALERT  Critical Value:  INR 5  Date & Time Notied:  05/23/2018 @ 2094  Provider Notified: Dr Thurnell Garbe  Orders Received/Actions taken: see new orders

## 2018-05-23 NOTE — ED Triage Notes (Signed)
History SOB COPD.  Woke up this am with SOB.  Received douneb from EMS.  Pt gave self neb treatment.  Pt reports that she smoked a cigarette .  #22g to left hand by EMS.

## 2018-05-23 NOTE — ED Notes (Signed)
CRITICAL VALUE ALERT  Critical Value:  PCo2 81.1  Date & Time Notied:  05/23/2018 @ 1046  Provider Notified: Dr Thurnell Garbe  Orders Received/Actions taken: see new orders

## 2018-05-23 NOTE — ED Provider Notes (Signed)
[facility] EMERGENCY DEPARTMENT Provider Note   CSN: [id number] Arrival date & time: 05/23/18  0849    History   Chief Complaint Chief Complaint  Patient presents with   Shortness of Breath    HPI [name] is a 68 y.o. female.     HPI  Pt was seen at 0935. Per EMS and pt report: Pt c/o gradual onset and worsening of persistent SOB that began last night after she smoked a cigarette. Pt states she was "up all night" SOB. Pt states she has been wearing her home O2 N/C and gave herself a neb treatment this morning without improvement of her symptoms. EMS gave neb treatment en route. Pt states she has been living alone with a "friend that drops by when he wants to" intermittently. Pt not forthcoming as to when this pt's last visit was. Pt states she had her INR level checked 2 weeks ago and it was "1.7." States she has had "black stools" for "a while." Pt also states she took her narcotic pain meds before she came to the ED, and took off her fentanyl patch yesterday (did not reapply another).  Denies fevers, known COVID+ exposure. Denies CP/palpitations, no abd pain, no N/V/D, no back pain, no rash.   Past Medical History:  Diagnosis Date   Acute MI ([place])    x4, code blue x3   Arteriosclerotic cardiovascular disease (ASCVD) 2002   Inf STEMI-2002. 2003-cutting balloon + brachytherapy for restenosis; subsequent acute stent thrombosis 06/2010 requiring 2 separate interventions (Zeta stent, then repeat cath with thrombectomy). focal basal inf AK, nl EF; 03/2011: Patent stents, minor nonobst  residual dz, nl EF; neg stress nuclear in 2008 and stress echo in 2009   Chronic anticoagulation    Warfarin plus ticagrelor   Chronic respiratory failure ([place]) 08/27/2013   On 2L 02   COPD (chronic obstructive pulmonary disease) ([place])    02 dependent   COPD (chronic obstructive pulmonary disease) ([place])    Factor 5 Leiden mutation, heterozygous ([place])    Factor V Leiden, prothrombin gene  mutation ([place]) 2006   Hyperlipidemia    Hypothyroidism    Noncompliance    Pelvic fracture ([place]) 2009   Pulmonary embolism ([place]) 2006   Associated with deep vein thrombosis-2006; + factor V Leiden   Tobacco abuse    50 pack years    Patient Active Problem List   Diagnosis Date Noted   Acute on chronic respiratory failure with hypoxia and hypercapnia ([place]) 01/29/2018   Acute and chronic respiratory failure (acute-on-chronic) ([place]) 01/29/2018   Pressure injury of skin 01/29/2018   Hyperglycemia 01/21/2018   COPD with exacerbation ([place]) 01/21/2018   Vertebral fracture, osteoporotic ([place]) 01/20/2018   Rectal bleeding 09/05/2017   Left lower quadrant pain 09/05/2017   Oxygen dependent 09/05/2017   Chronic back pain 05/16/2017   COPD exacerbation ([place]) 02/19/2017   Tachycardia 02/19/2017   Influenza A 02/19/2017   Acute respiratory failure ([place]) 02/19/2017   Dyspnea 11/26/2014   Loss of weight 11/26/2014   Chronic respiratory failure ([place]) 08/27/2013   Chest pain 08/24/2013   Encounter for therapeutic drug monitoring 02/13/2013   Abnormal weight loss 03/21/2012   Cerebrovascular disease 11/15/2011   CAD S/P percutaneous coronary angioplasty    Factor V Leiden, prothrombin gene mutation ([place])    Hyperlipidemia    Chronic anticoagulation    Hypothyroidism 11/23/2006   TOBACCO ABUSE 11/23/2006   History of pulmonary embolus (PE) 11/23/2006   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    Past Surgical History:  Procedure Laterality Date   COLONOSCOPY  Approximately 2000   Negative screening study   CORONARY ANGIOPLASTY  2002, 2003, 2012   LEFT AND RIGHT HEART CATHETERIZATION WITH CORONARY ANGIOGRAM N/A 04/03/2011   Procedure: LEFT AND RIGHT HEART CATHETERIZATION WITH CORONARY ANGIOGRAM;  Surgeon: Sherren Mocha, MD;  Location: [facility] CATH LAB;  Service: Cardiovascular;  Laterality: N/A;     OB History     Gravida  3   Para  2   Term  2   Preterm      AB  1   Living        SAB      TAB  1   Ectopic      Multiple      Live Births               Home Medications    Prior to Admission medications   Medication Sig Start Date End Date Taking? Authorizing Provider  albuterol (PROVENTIL) (2.5 MG/3ML) 0.083% nebulizer solution USE 1 VIAL BY NEBULIZER EVERY 4 HOURS AS NEEDED FOR WHEEZING. DX: J44.9 Patient taking differently: Take 2.5 mg by nebulization every 4 (four) hours as needed for wheezing or shortness of breath.  01/24/17   Magdalen Spatz, NP  arformoterol (BROVANA) 15 MCG/2ML NEBU Take 2 mLs (15 mcg total) by nebulization 2 (two) times daily. 09/07/17   Juanito Doom, MD  baclofen (LIORESAL) 10 MG tablet Take 1 tablet (10 mg total) by mouth 3 (three) times daily. 01/24/18   Lavina [place]man, MD  budesonide (PULMICORT) 0.5 MG/2ML nebulizer solution Take 2 mLs (0.5 mg total) by nebulization 2 (two) times daily. Dx: J43.9 01/24/17   Magdalen Spatz, NP  buPROPion (WELLBUTRIN SR) 100 MG 12 hr tablet Take 100 mg by mouth every morning.    [provider]  fentaNYL (DURAGESIC) 50 MCG/HR Place 1 patch onto the skin every 3 (three) days. 02/19/18   Johnson, Clanford L, MD  ferrous sulfate 324 (65 Fe) MG TBEC Take 1 tablet (325 mg total) by mouth daily with breakfast. 02/21/18   Johnson, Clanford L, MD  ferrous sulfate 325 (65 FE) MG tablet Take 325 mg by mouth daily with breakfast.    [provider]  folic acid (FOLVITE) 1 MG tablet Take 1 tablet (1 mg total) by mouth daily. 02/21/18 02/21/19  Johnson, Clanford L, MD  HYDROcodone-acetaminophen (NORCO/VICODIN) 5-325 MG tablet Take 1 tablet by mouth every 6 (six) hours as needed (BREAKTHROUGH PAIN). 02/19/18   Johnson, Clanford L, MD  ipratropium-albuterol (DUONEB) 0.5-2.5 (3) MG/3ML SOLN Take 3 mLs by nebulization 4 (four) times daily. 03/20/17   Magdalen Spatz, NP  levothyroxine (SYNTHROID, LEVOTHROID) 125 MCG tablet  Take 1 tablet (125 mcg total) by mouth daily. 08/16/17   Nafziger, Tommi Rumps, NP  lidocaine (LIDODERM) 5 % Place 1 patch onto the skin daily. Remove & Discard patch within 12 hours or as directed by MD 01/24/18   Lavina [place]man, MD  nitroGLYCERIN (NITROSTAT) 0.4 MG SL tablet Place 1 tablet (0.4 mg total) under the tongue every 5 (five) minutes as needed for chest pain. 10/28/14   Herminio Commons, MD  Omega-3 Fatty Acids (FISH OIL) 600 MG CAPS Take 1 capsule by mouth daily.     [provider]  OXYGEN Inhale 3-4 L into the lungs continuous. 3L continuous and 4L with exertion    [provider]  pantoprazole (PROTONIX) 40  MG tablet Take 1 tablet (40 mg total) by mouth daily. 02/20/18   Johnson, Clanford L, MD  polyethylene glycol (MIRALAX / GLYCOLAX) packet Take 17 g by mouth daily. Patient not taking: Reported on 02/15/2018 01/25/18   Lavina [place]man, MD  predniSONE (DELTASONE) 10 MG tablet Take 6 PO QAM x5days, 4 PO QAM x3days, 4 PO QAM x3days, 2 PO QAM x 3 days, 1 PO QD x 3 days 02/19/18   Murlean Iba, MD  Respiratory Therapy Supplies (FLUTTER) DEVI Use as directed 05/14/15   Javier Glazier, MD  ticagrelor (BRILINTA) 60 MG TABS tablet Take 1 tablet (60 mg total) by mouth 2 (two) times daily. 02/01/18   Johnson, Clanford L, MD  traZODone (DESYREL) 50 MG tablet Take 50 mg by mouth at bedtime as needed for sleep.    [provider]  warfarin (COUMADIN) 1 MG tablet Take 1 mg by mouth See admin instructions. Alternates taking 6mg  with 5mg  daily. Takes 5mg  on Mondays, Wednesdays, and Fridays. Takes 6mg  on all other days    [provider]  warfarin (COUMADIN) 5 MG tablet TAKE 1 TABLET BY MOUTH ONCE DAILY 03/11/18   Herminio Commons, MD    Family History Family History  Problem Relation Age of Onset   Emphysema Mother    Alzheimer's disease Mother    Emphysema Sister    Heart disease Father    Factor V Leiden deficiency Father    Factor V Leiden  deficiency Sister    Factor V Leiden deficiency Daughter    Kidney cancer Paternal Grandmother     Social History Social History   Tobacco Use   Smoking status: Current Some Day Smoker    Packs/day: 0.50    Years: 50.00    Pack years: 25.00    Types: Cigarettes    Start date: 05/10/1968   Smokeless tobacco: Never Used  Substance Use Topics   Alcohol use: No    Alcohol/week: 0.0 standard drinks   Drug use: No     Allergies   Dilaudid [hydromorphone hcl]; Minocycline hcl; Prednisone; Varenicline tartrate; and Zocor [simvastatin - high dose]   Review of Systems Review of Systems ROS: Statement: All systems negative except as marked or noted in the HPI; Constitutional: Negative for fever and chills. ; ; Eyes: Negative for eye pain, redness and discharge. ; ; ENMT: Negative for ear pain, hoarseness, nasal congestion, sinus pressure and sore throat. ; ; Cardiovascular: Negative for chest pain, palpitations, diaphoresis and peripheral edema. ; ; Respiratory: +SOB. Negative for cough, wheezing and stridor. ; ; Gastrointestinal: +"dark stools." Negative for nausea, vomiting, diarrhea, abdominal pain, blood in stool, hematemesis, jaundice and rectal bleeding. . ; ; Genitourinary: Negative for dysuria, flank pain and hematuria. ; ; Musculoskeletal: Negative for back pain and neck pain. Negative for swelling and trauma.; ; Skin: Negative for pruritus, rash, abrasions, blisters, bruising and skin lesion.; ; Neuro: Negative for headache, lightheadedness and neck stiffness. Negative for weakness, altered level of consciousness, altered mental status, extremity weakness, paresthesias, involuntary movement, seizure and syncope.       Physical Exam Updated Vital Signs BP (!) 118/51 (BP Location: Right Arm)    Pulse 95    Temp 98.5 F (36.9 C) (Oral)    Resp (!) 22    Ht 5\' 4"  (1.626 m)    Wt 77.1 kg    SpO2 100%    BMI 29.18 kg/m    Patient Vitals for the past 24 hrs:  BP Temp Temp src  Pulse Resp SpO2 Height Weight  05/23/18 1300 (!) 112/48 -- -- 78 18 99 % -- --  05/23/18 1230 (!) 109/45 -- -- 79 18 100 % -- --  05/23/18 1215 -- -- -- 96 20 100 % -- --  05/23/18 1200 (!) 135/57 -- -- 83 -- 98 % -- --  05/23/18 1150 (!) 96/47 -- -- 86 (!) 24 100 % -- --  05/23/18 1130 (!) 96/47 -- -- 81 15 99 % -- --  05/23/18 1100 (!) 105/41 -- -- 78 14 100 % -- --  05/23/18 0859 (!) 118/51 98.5 F (36.9 C) Oral 95 (!) 22 100 % 5\' 4"  (1.626 m) 77.1 kg     Physical Exam 0940: Physical examination:  Nursing notes reviewed; Vital signs and O2 SAT reviewed;  Constitutional: Well developed, Well nourished, Well hydrated, In no acute distress; Head:  Normocephalic, atraumatic; Eyes: EOMI, PERRL, No scleral icterus; ENMT: Mouth and pharynx normal, Mucous membranes moist; Neck: Supple, Full range of motion, No lymphadenopathy; Cardiovascular: Regular rate and rhythm, No gallop; Respiratory: Breath sounds diminished & equal bilaterally, faint wheezes. No audible wheezing. Speaking full sentences with ease, Normal respiratory effort/excursion; Chest: Nontender, Movement normal; Abdomen: Soft, Nontender, Nondistended, Normal bowel sounds. Rectal exam performed w/permission of pt and ED RN chaperone present.  Anal tone normal.  Non-tender, soft black stool in rectal vault, heme positive.  No fissures, no external hemorrhoids, no palp masses.;; Genitourinary: No CVA tenderness; Extremities: Peripheral pulses normal, No tenderness, +2 pedal edema bilat. No calf tenderness or asymmetry.; Neuro: Falls asleep during evaluation but awakens quickly to her name; states she "is tired because I was up all night." AA&Ox3, No facial droop.  Speech clear. No gross focal motor or sensory deficits in extremities.; Skin: Color normal, Warm, Dry.   ED Treatments / Results  Labs (all labs ordered are listed, but only abnormal results are displayed)   EKG EKG Interpretation  Date/Time:  Thursday May 23 2018 08:55:07  EDT Ventricular Rate:  90 PR Interval:    QRS Duration: 116 QT Interval:  365 QTC Calculation: 447 R Axis:   99 Text Interpretation:  Poor data quality Normal sinus rhythm Nonspecific intraventricular conduction delay Anterior infarct, old Borderline T abnormalities, inferior leads Artifact When compared with ECG of 02/15/2018 No significant change was found Confirmed by Francine Graven [phone]) on 05/23/2018 10:13:48 AM   Radiology   Procedures Procedures (including critical care time)  Medications Ordered in ED Medications - No data to display   Initial Impression / Assessment and Plan / ED Course  I have reviewed the triage vital signs and the nursing notes.  Pertinent labs & imaging results that were available during my care of the patient were reviewed by me and considered in my medical decision making (see chart for details).     MDM Reviewed: previous chart, nursing note and vitals Reviewed previous: labs and ECG Interpretation: labs, ECG and x-ray Total time providing critical care: 30-74 minutes. This excludes time spent performing separately reportable procedures and services. Consults: admitting MD    CRITICAL CARE Performed by: Francine Graven Total critical care time: 45 minutes Critical care time was exclusive of separately billable procedures and treating other patients. Critical care was necessary to treat or prevent imminent or life-threatening deterioration. Critical care was time spent personally by me on the following activities: development of treatment plan with patient and/or surrogate as well as nursing, discussions with consultants, evaluation of patient's response  to treatment, examination of patient, obtaining history from patient or surrogate, ordering and performing treatments and interventions, ordering and review of laboratory studies, ordering and review of radiographic studies, pulse oximetry and re-evaluation of patient's  condition.   Results for orders placed or performed during the hospital encounter of 05/23/18  SARS Coronavirus 2 (CEPHEID- Performed in [place] hospital lab), [facility] Order  Result Value Ref Range   SARS Coronavirus 2 NEGATIVE NEGATIVE  Basic metabolic panel  Result Value Ref Range   Sodium 143 135 - 145 mmol/L   Potassium 3.5 3.5 - 5.1 mmol/L   Chloride 96 (L) 98 - 111 mmol/L   CO2 39 (H) 22 - 32 mmol/L   Glucose, Bld 94 70 - 99 mg/dL   BUN 19 8 - 23 mg/dL   Creatinine, Ser 0.60 0.44 - 1.00 mg/dL   Calcium 7.8 (L) 8.9 - 10.3 mg/dL   GFR calc non Af Amer >60 >60 mL/min   GFR calc Af Amer >60 >60 mL/min   Anion gap 8 5 - 15  Brain natriuretic peptide  Result Value Ref Range   B Natriuretic Peptide 80.0 0.0 - 100.0 pg/mL  Troponin I - Once  Result Value Ref Range   Troponin I <0.03 <0.03 ng/mL  Lactic acid, plasma  Result Value Ref Range   Lactic Acid, Venous 0.6 0.5 - 1.9 mmol/L  Lactic acid, plasma  Result Value Ref Range   Lactic Acid, Venous 1.0 0.5 - 1.9 mmol/L  Protime-INR  Result Value Ref Range   Prothrombin Time 45.6 (H) 11.4 - 15.2 seconds   INR 5.0 (HH) 0.8 - 1.2  CBC with Differential  Result Value Ref Range   WBC 4.7 4.0 - 10.5 K/uL   RBC 2.30 (L) 3.87 - 5.11 MIL/uL   Hemoglobin 6.6 (LL) 12.0 - 15.0 g/dL   HCT 22.3 (L) 36.0 - 46.0 %   MCV 97.0 80.0 - 100.0 fL   MCH 28.7 26.0 - 34.0 pg   MCHC 29.6 (L) 30.0 - 36.0 g/dL   RDW 16.0 (H) 11.5 - 15.5 %   Platelets 203 150 - 400 K/uL   nRBC 0.0 0.0 - 0.2 %   Neutrophils Relative % 72 %   Neutro Abs 3.3 1.7 - 7.7 K/uL   Lymphocytes Relative 18 %   Lymphs Abs 0.8 0.7 - 4.0 K/uL   Monocytes Relative 10 %   Monocytes Absolute 0.5 0.1 - 1.0 K/uL   Eosinophils Relative 0 %   Eosinophils Absolute 0.0 0.0 - 0.5 K/uL   Basophils Relative 0 %   Basophils Absolute 0.0 0.0 - 0.1 K/uL   Immature Granulocytes 0 %   Abs Immature Granulocytes 0.02 0.00 - 0.07 K/uL  Urinalysis, Routine w reflex microscopic  Result  Value Ref Range   Color, Urine YELLOW YELLOW   APPearance CLEAR CLEAR   Specific Gravity, Urine 1.014 1.005 - 1.030   pH 7.0 5.0 - 8.0   Glucose, UA NEGATIVE NEGATIVE mg/dL   Hgb urine dipstick SMALL (A) NEGATIVE   Bilirubin Urine NEGATIVE NEGATIVE   Ketones, ur NEGATIVE NEGATIVE mg/dL   Protein, ur 30 (A) NEGATIVE mg/dL   Nitrite NEGATIVE NEGATIVE   Leukocytes,Ua NEGATIVE NEGATIVE   RBC / HPF 11-20 0 - 5 RBC/hpf   WBC, UA 0-5 0 - 5 WBC/hpf   Bacteria, UA RARE (A) NONE SEEN   Squamous Epithelial / LPF 0-5 0 - 5   Mucus PRESENT    Hyaline Casts, UA PRESENT  Blood gas, arterial  Result Value Ref Range   FIO2 28.00    pH, Arterial 7.320 (L) 7.350 - 7.450   pCO2 arterial 81.1 (HH) 32.0 - 48.0 mmHg   pO2, Arterial 75.9 (L) 83.0 - 108.0 mmHg   Bicarbonate 36.7 (H) 20.0 - 28.0 mmol/L   Acid-Base Excess 14.0 (H) 0.0 - 2.0 mmol/L   O2 Saturation 95.1 %   Patient temperature 36.9    Allens test (pass/fail) PASS PASS  Urine rapid drug screen (hosp performed)  Result Value Ref Range   Opiates POSITIVE (A) NONE DETECTED   Cocaine NONE DETECTED NONE DETECTED   Benzodiazepines NONE DETECTED NONE DETECTED   Amphetamines NONE DETECTED NONE DETECTED   Tetrahydrocannabinol NONE DETECTED NONE DETECTED   Barbiturates NONE DETECTED NONE DETECTED  Ethanol  Result Value Ref Range   Alcohol, Ethyl (B) <10 <10 mg/dL  POC occult blood, ED  Result Value Ref Range   Fecal Occult Bld POSITIVE (A) NEGATIVE  Type and screen  Result Value Ref Range   ABO/RH(D) A POS    Antibody Screen NEG    Sample Expiration 05/26/2018,2359    Unit Number [id number]    Blood Component Type RED CELLS,LR    Unit division 00    Status of Unit ALLOCATED    Transfusion Status OK TO TRANSFUSE    Crossmatch Result      Compatible Performed at [facility], [address], [place], [place] [address]    Unit Number [id number]    Blood Component Type RED CELLS,LR    Unit division 00    Status of Unit  ALLOCATED    Transfusion Status OK TO TRANSFUSE    Crossmatch Result Compatible   Prepare RBC  Result Value Ref Range   Order Confirmation      ORDER PROCESSED BY BLOOD BANK Performed at [facility], [address], [place], [place] [address]   BPAM [facility]  Result Value Ref Range   Blood Product Unit Number [id number]    PRODUCT CODE [id number]    Unit Type and Rh 6200    Blood Product Expiration Date 379024097353    Blood Product Unit Number [id number]    PRODUCT CODE [id number]    Unit Type and Rh 6200    Blood Product Expiration Date [id number]    Dg Chest Portable 1 View Result Date: 05/23/2018 CLINICAL DATA:  Shortness of breath EXAM: PORTABLE CHEST 1 VIEW COMPARISON:  February 15, 2018 chest radiograph; chest CT November 14, 2017. FINDINGS: There is mild scarring in the left base. There remains interstitial thickening bilaterally. There is no frank edema or consolidation. Heart is slightly enlarged with pulmonary vascularity normal. No adenopathy. No bone lesions. IMPRESSION: Scarring left base. Interstitial thickening, stable. Underlying emphysematous change better seen on prior CT. No edema or consolidation. Stable cardiac prominence. Emphysema (ICD10-J43.9). Electronically Signed   By: Lowella Grip III M.D.   On: 05/23/2018 09:47    [name] was evaluated in Emergency Department on 05/23/2018 for the symptoms described in the history of present illness. She was evaluated in the context of the global COVID-19 pandemic, which necessitated consideration that the patient might be at risk for infection with the SARS-CoV-2 virus that causes COVID-19. Institutional protocols and algorithms that pertain to the evaluation of patients at risk for COVID-19 are in a state of rapid change based on information released by regulatory bodies including the CDC and federal and state organizations. These policies and algorithms were followed during the patient's care  in the ED.    1300:   Covid testing negative: hour long neb started, IV solumedrol given. Bipap started for elevated Pco2 on ABG; though ABG does appear to be a compensated respiratory acidosis. Pt also given IV narcan with good effect. PRBC 1 unit to be transfused to low H/H.  INR mildly elevated; will need to hold doses. BP soft; IVF boluses given with good effect.  T/C returned from [facility] Dr. Dyann Kief, case discussed, including:  HPI, pertinent PM/SHx, VS/PE, dx testing, ED course and treatment:  Agreeable to admit.           Final Clinical Impressions(s) / ED Diagnoses   Final diagnoses:  None    ED Discharge Orders    None       Francine Graven, DO 05/27/18 [id number]

## 2018-05-23 NOTE — ED Notes (Signed)
CRITICAL VALUE ALERT  Critical Value:  Hemoglobin 6.6  Date & Time Notied:  05/23/2018 @ 1036  Provider Notified: Dr Thurnell Garbe  Orders Received/Actions taken: se new orders.

## 2018-05-23 NOTE — ED Notes (Signed)
Respiratory notified of neb treatment 

## 2018-05-23 NOTE — Progress Notes (Signed)
Has established IV: needs secondary site. Stuck x 4 by two RN's. IV team consult placed.

## 2018-05-24 DIAGNOSIS — E440 Moderate protein-calorie malnutrition: Secondary | ICD-10-CM

## 2018-05-24 LAB — TYPE AND SCREEN
ABO/RH(D): A POS
Antibody Screen: NEGATIVE
Unit division: 0
Unit division: 0

## 2018-05-24 LAB — BPAM RBC
Blood Product Expiration Date: 202006012359
Blood Product Expiration Date: 202006012359
ISSUE DATE / TIME: 202005141516
ISSUE DATE / TIME: 202005141835
Unit Type and Rh: 6200
Unit Type and Rh: 6200

## 2018-05-24 LAB — COMPREHENSIVE METABOLIC PANEL
ALT: 9 U/L (ref 0–44)
AST: 13 U/L — ABNORMAL LOW (ref 15–41)
Albumin: 2.7 g/dL — ABNORMAL LOW (ref 3.5–5.0)
Alkaline Phosphatase: 63 U/L (ref 38–126)
Anion gap: 7 (ref 5–15)
BUN: 17 mg/dL (ref 8–23)
CO2: 36 mmol/L — ABNORMAL HIGH (ref 22–32)
Calcium: 7.9 mg/dL — ABNORMAL LOW (ref 8.9–10.3)
Chloride: 95 mmol/L — ABNORMAL LOW (ref 98–111)
Creatinine, Ser: 0.63 mg/dL (ref 0.44–1.00)
GFR calc Af Amer: >60 mL/min (ref >60)
GFR calc non Af Amer: >60 mL/min (ref >60)
Glucose, Bld: 132 mg/dL — ABNORMAL HIGH (ref 70–99)
Potassium: 4.2 mmol/L (ref 3.5–5.1)
Sodium: 138 mmol/L (ref 135–145)
Total Bilirubin: 0.5 mg/dL (ref 0.3–1.2)
Total Protein: 5.7 g/dL — ABNORMAL LOW (ref 6.5–8.1)

## 2018-05-24 LAB — URINE CULTURE

## 2018-05-24 LAB — CBC
HCT: 28.3 % — ABNORMAL LOW (ref 36.0–46.0)
Hemoglobin: 8.8 g/dL — ABNORMAL LOW (ref 12.0–15.0)
MCH: 29.4 pg (ref 26.0–34.0)
MCHC: 31.1 g/dL (ref 30.0–36.0)
MCV: 94.6 fL (ref 80.0–100.0)
Platelets: 207 10*3/uL (ref 150–400)
RBC: 2.99 MIL/uL — ABNORMAL LOW (ref 3.87–5.11)
RDW: 15.7 % — ABNORMAL HIGH (ref 11.5–15.5)
WBC: 3.1 10*3/uL — ABNORMAL LOW (ref 4.0–10.5)
nRBC: 0 % (ref 0.0–0.2)

## 2018-05-24 LAB — PROTIME-INR
INR: 4.9 (ref 0.8–1.2)
INR: 4.4 (ref 0.8–1.2)
Prothrombin Time: 44.6 seconds — ABNORMAL HIGH (ref 11.4–15.2)
Prothrombin Time: 41.6 seconds — ABNORMAL HIGH (ref 11.4–15.2)

## 2018-05-24 LAB — PREPARE RBC (CROSSMATCH)

## 2018-05-24 LAB — MRSA PCR SCREENING: MRSA by PCR: NEGATIVE

## 2018-05-24 MED ORDER — BOOST / RESOURCE BREEZE PO LIQD CUSTOM
1.0000 | Freq: Two times a day (BID) | ORAL | Status: DC
  Administered 2018-05-24 – 2018-05-29 (×5): 1 via ORAL

## 2018-05-24 MED ORDER — PHYTONADIONE 5 MG PO TABS
2.5000 mg | ORAL_TABLET | Freq: Once | ORAL | Status: AC
  Administered 2018-05-24: 2.5 mg via ORAL
  Filled 2018-05-24: qty 1

## 2018-05-24 MED ORDER — FENTANYL CITRATE (PF) 100 MCG/2ML IJ SOLN
100.0000 ug | INTRAMUSCULAR | Status: AC | PRN
  Administered 2018-05-27 – 2018-05-28 (×2): 100 ug via INTRAVENOUS
  Filled 2018-05-24 (×3): qty 2

## 2018-05-24 MED ORDER — PRO-STAT SUGAR FREE PO LIQD
30.0000 mL | Freq: Two times a day (BID) | ORAL | Status: DC
  Administered 2018-05-24 – 2018-05-29 (×9): 30 mL via ORAL
  Filled 2018-05-24 (×10): qty 30

## 2018-05-24 MED ORDER — BISACODYL 5 MG PO TBEC
5.0000 mg | DELAYED_RELEASE_TABLET | Freq: Every day | ORAL | Status: DC | PRN
  Administered 2018-05-25: 5 mg via ORAL
  Filled 2018-05-24: qty 1

## 2018-05-24 MED ORDER — POLYETHYLENE GLYCOL 3350 17 G PO PACK: 17.0000 g | PACK | Freq: Every day | ORAL | Status: DC | PRN

## 2018-05-24 MED ORDER — POLYETHYLENE GLYCOL 3350 17 G PO PACK
17.0000 g | PACK | Freq: Every day | ORAL | Status: DC
  Administered 2018-05-25 – 2018-05-26 (×2): 17 g via ORAL
  Filled 2018-05-24 (×2): qty 1

## 2018-05-24 MED ORDER — ADULT MULTIVITAMIN W/MINERALS CH
1.0000 | ORAL_TABLET | Freq: Every day | ORAL | Status: DC
  Administered 2018-05-24 – 2018-05-29 (×5): 1 via ORAL
  Filled 2018-05-24 (×5): qty 1

## 2018-05-24 NOTE — Care Management Important Message (Signed)
Important Message  Patient Details  Name: [name] MRN: [id number] Date of Birth: Jun 13, 1950   Medicare Important Message Given:  Yes    [name] 05/24/2018, 1:18 PM

## 2018-05-24 NOTE — NC FL2 (Deleted)
[place] LEVEL OF CARE SCREENING TOOL     IDENTIFICATION  Patient Name: [name] Birthdate: 27-Aug-1950 Sex: female Admission Date (Current Location): 05/23/2018  [facility] and [place] Number:  [employer] and Address:  [place] [address], [place]      Provider Number: [id number]  Attending Physician Name and Address:  Barton Dubois, MD  Relative Name and Phone Number:  Unknown Foley [phone]    Current Level of Care: Hospital Recommended Level of Care: [place] Prior Approval Number:    Date Approved/Denied:   PASRR Number:    Discharge Plan: SNF    Current Diagnoses: Patient Active Problem List   Diagnosis Date Noted  . Acute on chronic respiratory failure ([place]) 05/23/2018  . Melena 05/23/2018  . 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 Depression 05/23/2018  . Chronic pain syndrome 05/23/2018  . Hypercapnia 05/23/2018  . Acute on chronic respiratory failure with hypoxia and hypercapnia ([place]) 01/29/2018  . Acute and chronic respiratory failure (acute-on-chronic) ([place]) 01/29/2018  . Pressure injury of skin 01/29/2018  . Hyperglycemia 01/21/2018  . COPD with exacerbation ([place]) 01/21/2018  . Vertebral fracture, osteoporotic ([place]) 01/20/2018  . Rectal bleeding 09/05/2017  . Left lower quadrant pain 09/05/2017  . Oxygen dependent 09/05/2017  . Chronic back pain 05/16/2017  . COPD exacerbation ([place]) 02/19/2017  . Tachycardia 02/19/2017  . Influenza A 02/19/2017  . Acute respiratory failure ([place]) 02/19/2017  . Dyspnea 11/26/2014  . Loss of weight 11/26/2014  . Chronic respiratory failure ([place]) 08/27/2013  . Chest pain 08/24/2013  . Encounter for therapeutic drug monitoring 02/13/2013  . Abnormal weight loss 03/21/2012  . Cerebrovascular disease 11/15/2011  . CAD S/P percutaneous coronary angioplasty   . Factor V Leiden, prothrombin gene mutation ([place])   . Hyperlipidemia   .  Chronic anticoagulation   . Hypothyroidism 11/23/2006  . TOBACCO ABUSE 11/23/2006  . History of pulmonary embolus (PE) 11/23/2006  . 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    Orientation RESPIRATION BLADDER Height & Weight     Self, Time, Situation, Place  O2 Continent Weight: 69.4 kg Height:  5\' 6"  (167.6 cm)  BEHAVIORAL SYMPTOMS/MOOD NEUROLOGICAL BOWEL NUTRITION STATUS  (N/A) (n/a) Continent Diet(regulary)  AMBULATORY STATUS COMMUNICATION OF NEEDS Skin   Extensive Assist Verbally PU Stage and Appropriate Care, Other (Comment)(Deep Tissue Injury to MId and bilateral buttock)   PU Stage 2 Dressing: Daily                   Personal Care Assistance Level of Assistance  Bathing, Feeding, Dressing Bathing Assistance: Limited assistance Feeding assistance: Independent Dressing Assistance: Limited assistance     Functional Limitations Info  Sight, Hearing, Speech Sight Info: Adequate Hearing Info: Adequate Speech Info: Adequate    SPECIAL CARE FACTORS FREQUENCY  PT (By licensed PT)     PT Frequency: 5 days/week              Contractures Contractures Info: Not present    Additional Factors Info  Code Status, Allergies Code Status Info: Full Allergies Info: dilaudid, minocycline, prednisone, varenicline, zocor           Current Medications (05/24/2018):  This is the current hospital active medication list Current Facility-Administered Medications  Medication Dose Route Frequency Provider Last Rate Last Dose  . 0.9 %  sodium chloride infusion  10 mL/hr Intravenous Once Francine Graven, DO   Stopped at 05/23/18  1538  . 0.9 %  sodium chloride infusion   Intravenous Continuous Barton Dubois, MD 10 mL/hr at 05/24/18 0100    . arformoterol (BROVANA) nebulizer solution 15 mcg  15 mcg Nebulization BID Barton Dubois, MD   15 mcg at 05/24/18 0736  . budesonide (PULMICORT) nebulizer solution 0.5 mg  0.5 mg Nebulization BID Barton Dubois, MD   0.5 mg at 05/24/18 0736  . buPROPion (WELLBUTRIN SR) 12 hr tablet 100 mg  100 mg Oral q morning - 10a Barton Dubois, MD   100 mg at 05/24/18 0955  . citalopram (CELEXA) tablet 10 mg  10 mg Oral Daily Barton Dubois, MD   10 mg at 05/24/18 0956  . dextromethorphan-guaiFENesin (MUCINEX DM) 30-600 MG per 12 hr tablet 1 tablet  1 tablet Oral BID Barton Dubois, MD   1 tablet at 05/24/18 0955  . fentaNYL (SUBLIMAZE) injection 100 mcg  100 mcg Intravenous Q2H PRN Reubin Milan, MD      . ferrous sulfate tablet 325 mg  325 mg Oral Q breakfast Barton Dubois, MD   325 mg at 05/24/18 0831  . folic acid (FOLVITE) tablet 1 mg  1 mg Oral Daily Barton Dubois, MD   1 mg at 05/24/18 0955  . HYDROcodone-acetaminophen (NORCO/VICODIN) 5-325 MG per tablet 1 tablet  1 tablet Oral Q8H PRN Barton Dubois, MD   1 tablet at 05/24/18 0954  . ipratropium-albuterol (DUONEB) 0.5-2.5 (3) MG/3ML nebulizer solution 3 mL  3 mL Nebulization Q6H Barton Dubois, MD   3 mL at 05/24/18 0736  . levothyroxine (SYNTHROID) tablet 125 mcg  125 mcg Oral Q0600 Barton Dubois, MD   125 mcg at 05/24/18 [phone]  . methylPREDNISolone sodium succinate (SOLU-MEDROL) 125 mg/2 mL injection 60 mg  60 mg Intravenous Q12H Barton Dubois, MD   60 mg at 05/24/18 0831  . nicotine (NICODERM CQ - dosed in mg/24 hours) patch 21 mg  21 mg Transdermal Daily Barton Dubois, MD   21 mg at 05/23/18 1805  . omega-3 acid ethyl esters (LOVAZA) capsule 1 g  1 capsule Oral Daily Barton Dubois, MD   1 g at 05/24/18 0956  . pantoprazole (PROTONIX) injection 40 mg  40 mg Intravenous Q12H Barton Dubois, MD   40 mg at 05/24/18 0957  . traZODone (DESYREL) tablet 50 mg  50 mg Oral QHS PRN Barton Dubois, MD   50 mg at 05/23/18 2235     Discharge Medications: Please see discharge summary for a list of discharge medications.  Relevant Imaging Results:  Relevant Lab Results:   Additional Information SSN: [id number]  Sherald Barge,  RN

## 2018-05-24 NOTE — Progress Notes (Addendum)
Patient requests to be changed to [place].  She is awake and oriented x 4  She is in no obvious or stated distress  vitals signs are noted on Flowsheet. [place] @ 5 LPM. RT is aware

## 2018-05-24 NOTE — NC FL2 (Signed)
[place] LEVEL OF CARE SCREENING TOOL     IDENTIFICATION  Patient Name: [name] Birthdate: 1950-05-25 Sex: female Admission Date (Current Location): 05/23/2018  [facility] and [place] Number:  [employer] and Address:  [place] [address], [place]      Provider Number: [id number]  Attending Physician Name and Address:  Barton Dubois, MD  Relative Name and Phone Number:  Unknown Foley [phone]    Current Level of Care: Hospital Recommended Level of Care: [place] Prior Approval Number:    Date Approved/Denied:   PASRR Number:    Discharge Plan: SNF    Current Diagnoses: Patient Active Problem List   Diagnosis Date Noted  . Acute on chronic respiratory failure ([place]) 05/23/2018  . Melena 05/23/2018  . 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 Depression 05/23/2018  . Chronic pain syndrome 05/23/2018  . Hypercapnia 05/23/2018  . Acute on chronic respiratory failure with hypoxia and hypercapnia ([place]) 01/29/2018  . Acute and chronic respiratory failure (acute-on-chronic) ([place]) 01/29/2018  . Pressure injury of skin 01/29/2018  . Hyperglycemia 01/21/2018  . COPD with exacerbation ([place]) 01/21/2018  . Vertebral fracture, osteoporotic ([place]) 01/20/2018  . Rectal bleeding 09/05/2017  . Left lower quadrant pain 09/05/2017  . Oxygen dependent 09/05/2017  . Chronic back pain 05/16/2017  . COPD exacerbation ([place]) 02/19/2017  . Tachycardia 02/19/2017  . Influenza A 02/19/2017  . Acute respiratory failure ([place]) 02/19/2017  . Dyspnea 11/26/2014  . Loss of weight 11/26/2014  . Chronic respiratory failure ([place]) 08/27/2013  . Chest pain 08/24/2013  . Encounter for therapeutic drug monitoring 02/13/2013  . Abnormal weight loss 03/21/2012  . Cerebrovascular disease 11/15/2011  . CAD S/P percutaneous coronary angioplasty   . Factor V Leiden, prothrombin gene mutation ([place])   . Hyperlipidemia   .  Chronic anticoagulation   . Hypothyroidism 11/23/2006  . TOBACCO ABUSE 11/23/2006  . History of pulmonary embolus (PE) 11/23/2006  . COPD (chronic obstructive pulmonary disease) with emphysema ([place]) 11/23/2006    Orientation RESPIRATION BLADDER Height & Weight     Self, Time, Situation, Place  O2 Continent Weight: 69.4 kg Height:  5\' 6"  (167.6 cm)  BEHAVIORAL SYMPTOMS/MOOD NEUROLOGICAL BOWEL NUTRITION STATUS  (N/A) (n/a) Continent Diet(regulary)  AMBULATORY STATUS COMMUNICATION OF NEEDS Skin   Extensive Assist Verbally PU Stage and Appropriate Care, Other (Comment)(Deep Tissue Injury to MId and bilateral buttock)   PU Stage 2 Dressing: Daily                   Personal Care Assistance Level of Assistance  Bathing, Feeding, Dressing Bathing Assistance: Limited assistance Feeding assistance: Independent Dressing Assistance: Limited assistance     Functional Limitations Info  Sight, Hearing, Speech Sight Info: Adequate Hearing Info: Adequate Speech Info: Adequate    SPECIAL CARE FACTORS FREQUENCY  OT (By licensed OT)     PT Frequency: 5 days/week OT Frequency: 5 days/week            Contractures Contractures Info: Not present    Additional Factors Info  Code Status, Allergies Code Status Info: Full Allergies Info: dilaudid, minocycline, prednisone, varenicline, zocor           Current Medications (05/24/2018):  This is the current hospital active medication list Current Facility-Administered Medications  Medication Dose Route Frequency Provider Last Rate Last Dose  . 0.9 %  sodium chloride infusion  10 mL/hr Intravenous Once Francine Graven, DO   Stopped  at 05/23/18 1538  . 0.9 %  sodium chloride infusion   Intravenous Continuous Barton Dubois, MD 10 mL/hr at 05/24/18 0100    . arformoterol (BROVANA) nebulizer solution 15 mcg  15 mcg Nebulization BID Barton Dubois, MD   15 mcg at 05/24/18 0736  . budesonide (PULMICORT) nebulizer solution 0.5 mg  0.5 mg  Nebulization BID Barton Dubois, MD   0.5 mg at 05/24/18 0736  . buPROPion (WELLBUTRIN SR) 12 hr tablet 100 mg  100 mg Oral q morning - 10a Barton Dubois, MD   100 mg at 05/24/18 0955  . citalopram (CELEXA) tablet 10 mg  10 mg Oral Daily Barton Dubois, MD   10 mg at 05/24/18 0956  . dextromethorphan-guaiFENesin (MUCINEX DM) 30-600 MG per 12 hr tablet 1 tablet  1 tablet Oral BID Barton Dubois, MD   1 tablet at 05/24/18 0955  . fentaNYL (SUBLIMAZE) injection 100 mcg  100 mcg Intravenous Q2H PRN Reubin Milan, MD      . ferrous sulfate tablet 325 mg  325 mg Oral Q breakfast Barton Dubois, MD   325 mg at 05/24/18 0831  . folic acid (FOLVITE) tablet 1 mg  1 mg Oral Daily Barton Dubois, MD   1 mg at 05/24/18 0955  . HYDROcodone-acetaminophen (NORCO/VICODIN) 5-325 MG per tablet 1 tablet  1 tablet Oral Q8H PRN Barton Dubois, MD   1 tablet at 05/24/18 0954  . ipratropium-albuterol (DUONEB) 0.5-2.5 (3) MG/3ML nebulizer solution 3 mL  3 mL Nebulization Q6H Barton Dubois, MD   3 mL at 05/24/18 0736  . levothyroxine (SYNTHROID) tablet 125 mcg  125 mcg Oral Q0600 Barton Dubois, MD   125 mcg at 05/24/18 [phone]  . methylPREDNISolone sodium succinate (SOLU-MEDROL) 125 mg/2 mL injection 60 mg  60 mg Intravenous Q12H Barton Dubois, MD   60 mg at 05/24/18 0831  . nicotine (NICODERM CQ - dosed in mg/24 hours) patch 21 mg  21 mg Transdermal Daily Barton Dubois, MD   21 mg at 05/23/18 1805  . omega-3 acid ethyl esters (LOVAZA) capsule 1 g  1 capsule Oral Daily Barton Dubois, MD   1 g at 05/24/18 0956  . pantoprazole (PROTONIX) injection 40 mg  40 mg Intravenous Q12H Barton Dubois, MD   40 mg at 05/24/18 0957  . traZODone (DESYREL) tablet 50 mg  50 mg Oral QHS PRN Barton Dubois, MD   50 mg at 05/23/18 2235     Discharge Medications: Please see discharge summary for a list of discharge medications.  Relevant Imaging Results:  Relevant Lab Results:   Additional Information SSN:  [id number]  Sherald Barge, RN

## 2018-05-24 NOTE — Plan of Care (Signed)
  Problem: Acute Rehab OT Goals (only OT should resolve) Goal: Pt. Will Perform Grooming Flowsheets (Taken 05/24/2018 1040) Pt Will Perform Grooming: with supervision; sitting; standing Goal: Pt. Will Perform Upper Body Dressing Flowsheets (Taken 05/24/2018 1040) Pt Will Perform Upper Body Dressing: with modified independence; sitting Goal: Pt. Will Perform Lower Body Dressing Flowsheets (Taken 05/24/2018 1040) Pt Will Perform Lower Body Dressing: with supervision; sitting/lateral leans; sit to/from stand Goal: Pt. Will Transfer To Toilet Flowsheets (Taken 05/24/2018 1040) Pt Will Transfer to Toilet: with supervision; stand pivot transfer; ambulating; regular height toilet; bedside commode Goal: Pt. Will Perform Toileting-Clothing Manipulation Flowsheets (Taken 05/24/2018 1040) Pt Will Perform Toileting - Clothing Manipulation and hygiene: with supervision; sitting/lateral leans; sit to/from stand Goal: Pt/Caregiver Will Perform Home Exercise Program Flowsheets (Taken 05/24/2018 1040) Pt/caregiver will Perform Home Exercise Program: Increased strength; Both right and left upper extremity; Independently; With written HEP provided

## 2018-05-24 NOTE — Plan of Care (Signed)
  Problem: Acute Rehab PT Goals(only PT should resolve) Goal: Pt Will Go Supine/Side To Sit Outcome: Progressing Flowsheets (Taken 05/24/2018 1139) Pt will go Supine/Side to Sit: with min guard assist Goal: Patient Will Transfer Sit To/From Stand Outcome: Progressing Flowsheets (Taken 05/24/2018 1139) Patient will transfer sit to/from stand: with min guard assist Goal: Pt Will Transfer Bed To Chair/Chair To Bed Outcome: Progressing Flowsheets (Taken 05/24/2018 1139) Pt will Transfer Bed to Chair/Chair to Bed: min guard assist Goal: Pt Will Ambulate Outcome: Progressing Flowsheets (Taken 05/24/2018 1139) Pt will Ambulate: 50 feet; with rolling walker; with minimal assist; with cane   11:40 AM, 05/24/18 Lonell Grandchild, MPT Physical Therapist with [facility] [phone] office [phone] mobile phone

## 2018-05-24 NOTE — TOC Initial Note (Addendum)
Transition of Care [facility]) - Initial/Assessment Note    Patient Details  Name: [name] MRN: [id number] Date of Birth: 04/19/1950  [facility]) CM/SW Contact:    Sherald Barge, RN Phone Number: 05/24/2018, 12:26 PM  Clinical Narrative:           Recommended for SNF. Pt originally from home. Pt has had 91 days of wellness (if she hasn't been hospitalized at another health system, pt was to sleepy to discuss during second visit today). Pt would like to be closer to her daughter in [place]. CM attempted to contact daughter but phone number listed was not working. Pt has given permission for TOC to send referrals to agencies in [place] and within 44mi radius with 3+ star rating. Referrals have been sent to [facility], [place], [place], [place], [place], [place]. MD anticipates pt may not be ready until Monday. TOC will cont to follow.   ADDENDUM:   [facility] (bed offered) [facility] (no reply) [facility] (no reply) [facility] (not taking pt's d/t COVID outbreak)  Rep at [place] ([employer] [phone]) verified pt has reset her Medicare days and has 20 days at no cost. Pt aware of current bed offers. She is hesitant about accepting bed offer from place with 2 our of 5 stars. If she does not accept SNF bed offers she plans [facility]. She has used [facility] in the past, would be okay with using them again. Per dietician pt says she is not compliant with CPAP and neb tx's because she does not know how to use the machines. Pt was [place] [facility] with [facility] after Feb admission and [facility] RN would have provided education.   Pt also has open APS case. Last APS case from February was terminated d/t pt refusing to apply for long term medicaid for placement. Pt was referred back to APS this week by EMS.  If pt chooses [facility] she will need a negative COVID test resulted within 48 hours of DC.    Expected Discharge Plan:  [place]     Patient Goals and CMS Choice Patient states their goals for this hospitalization and ongoing recovery are:: go somewhere closer to my daughter CMS Medicare.gov Compare Post Acute Care list provided to:: Patient Choice offered to / list presented to : Patient  Expected Discharge Plan and Services Expected Discharge Plan: [place] Choice: [place] arrangements for the past 2 months: Single Family Home                     Prior Living Arrangements/Services Living arrangements for the past 2 months: Single Family Home Lives with:: Self Patient language and need for interpreter reviewed:: Yes Do you feel safe going back to the place where you live?: No   does not feel she can manage at home, will work on placement  Need for Family Participation in Patient Care: Yes (Comment) Care giver support system in place?: Yes (comment)(friends help some)   Criminal Activity/Legal Involvement Pertinent to Current Situation/Hospitalization: No - Comment as needed  Activities of Daily Living Home Assistive Devices/Equipment: Cane (specify quad or straight) ADL Screening (condition at time of admission) Patient's cognitive ability adequate to safely complete daily activities?: Yes Is the patient deaf or have difficulty hearing?: No Does the patient have difficulty seeing, even when wearing glasses/contacts?: No Does the patient have difficulty  concentrating, remembering, or making decisions?: No Patient able to express need for assistance with ADLs?: Yes Does the patient have difficulty dressing or bathing?: Yes Independently performs ADLs?: No Communication: Independent Dressing (OT): Needs assistance Is this a change from baseline?: Change from baseline, expected to last <3days Grooming: Needs assistance Is this a change from baseline?: Change from baseline, expected to last <3 days Feeding:  Independent Bathing: Needs assistance Is this a change from baseline?: Change from baseline, expected to last <3 days Toileting: Needs assistance Is this a change from baseline?: Change from baseline, expected to last <3 days In/Out Bed: Needs assistance Is this a change from baseline?: Change from baseline, expected to last <3 days Walks in Home: Independent with device (comment) Does the patient have difficulty walking or climbing stairs?: Yes Weakness of Legs: Both Weakness of Arms/Hands: None  Permission Sought/Granted Permission sought to share information with : Customer service manager, Other (comment) Permission granted to share information with : Yes, Verbal Permission Granted  Share Information with NAME: daughter  Permission granted to share info w AGENCY: facilities in [place] and within 25 mi radius with 3+ star rating        Emotional Assessment Appearance:: Appears older than stated age Attitude/Demeanor/Rapport: Apprehensive Affect (typically observed): Blunt Orientation: : Oriented to Self, Oriented to  Time, Oriented to Place, Oriented to Situation      Admission diagnosis:  Shortness of breath [R06.02] Melena [K92.1] Chronic anticoagulation [Z79.01] Tobacco use disorder, continuous [F17.209] COPD with acute exacerbation (HCC) [J44.1] Symptomatic anemia [D64.9] Patient Active Problem List   Diagnosis Date Noted  . Acute on chronic respiratory failure ([place]) 05/23/2018  . Melena 05/23/2018  . 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 Depression 05/23/2018  . Chronic pain syndrome 05/23/2018  . Hypercapnia 05/23/2018  . Acute on chronic respiratory failure with hypoxia and hypercapnia ([place]) 01/29/2018  . Acute and chronic respiratory failure (acute-on-chronic) ([place]) 01/29/2018  . Pressure injury of skin 01/29/2018  . Hyperglycemia 01/21/2018  . COPD with exacerbation ([place]) 01/21/2018  . Vertebral fracture, osteoporotic ([place]) 01/20/2018  . Rectal  bleeding 09/05/2017  . Left lower quadrant pain 09/05/2017  . Oxygen dependent 09/05/2017  . Chronic back pain 05/16/2017  . COPD exacerbation ([place]) 02/19/2017  . Tachycardia 02/19/2017  . Influenza A 02/19/2017  . Acute respiratory failure ([place]) 02/19/2017  . Dyspnea 11/26/2014  . Loss of weight 11/26/2014  . Chronic respiratory failure ([place]) 08/27/2013  . Chest pain 08/24/2013  . Encounter for therapeutic drug monitoring 02/13/2013  . Abnormal weight loss 03/21/2012  . Cerebrovascular disease 11/15/2011  . CAD S/P percutaneous coronary angioplasty   . Factor V Leiden, prothrombin gene mutation ([place])   . Hyperlipidemia   . Chronic anticoagulation   . Hypothyroidism 11/23/2006  . TOBACCO ABUSE 11/23/2006  . History of pulmonary embolus (PE) 11/23/2006  . COPD (chronic obstructive pulmonary disease) with emphysema ([place]) 11/23/2006   PCP:  [name], NP Pharmacy:   [facility] [place], [place] [address] [place] [address] Phone: [phone] Fax: [phone]

## 2018-05-24 NOTE — Evaluation (Signed)
Occupational Therapy Evaluation Patient Details Name: [name] MRN: [id number] DOB: 1950-08-30 Today's Date: 05/24/2018    History of Present Illness [name] is a 68 y.o. 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  Patient reports symptom has been present for the last 3 to 4 days and worsening.  She has used home nebulizer without significant improvement.  Patient continues to smoke.  Patient reported intermittent episode of productive cough and also expressed positive/worsening wheezing.  Patient reports intermittent episode of melanotic stools and ongoing sensation of feeling weak and tired at all times.    Clinical Impression   Pt received supine in bed, agreeable to OT/PT co-evaluation. Pt is familiar to rehab staff from prior stays. Pt presents with decreased strength and poor activity tolerance due to SOB and pain, requiring increased assistance for basic ADL completion. Pt has very minimal and unreliable assistance per chart review and pt discussion, is unsafe to return home at discharge. In discussion with pt, she states that she did not change clothes often, bathe, or performing grooming tasks at home after her prior discharge because she couldn't get up well and didn't have any help Recommend SNF on discharge to improve safety and independence in ADL completion and functional mobility tasks.     Follow Up Recommendations  SNF    Equipment Recommendations  None recommended by OT       Precautions / Restrictions Precautions Precautions: Fall Restrictions Weight Bearing Restrictions: No      Mobility Bed Mobility               General bed  mobility comments: Defer to PT note  Transfers                 General transfer comment: Defer to PT note        ADL either performed or assessed with clinical judgement   ADL Overall ADL's : Needs assistance/impaired Eating/Feeding: Modified independent;Bed level               Upper Body Dressing : Minimal assistance;Sitting Upper Body Dressing Details (indicate cue type and reason): Assist with donning gown on back as a robe. Pt able to thread her arms through with OT holding sleeves Lower Body Dressing: Total assistance;Bed level Lower Body Dressing Details (indicate cue type and reason): donning socks Toilet Transfer: Minimal assistance;Moderate assistance;Stand-pivot;BSC;RW Toilet Transfer Details (indicate cue type and reason): transfered from chair to [facility] and back to chair           General ADL Comments: Pt limited in ADL completion due to pain this am. Poor activity tolerance also limiting participation     Vision Baseline Vision/History: No visual deficits Patient Visual Report: No change from baseline Vision Assessment?: No apparent visual deficits            Pertinent Vitals/Pain Pain Assessment: 0-10 Pain Score: 10-Worst pain ever Pain Location: low back Pain Descriptors / Indicators: Aching;Grimacing;Moaning Pain Intervention(s): Limited activity within patient's tolerance;Monitored during session;Repositioned     Hand Dominance Right   Extremity/Trunk Assessment Upper Extremity Assessment Upper Extremity Assessment: Generalized weakness   Lower Extremity Assessment Lower Extremity Assessment: Defer to PT evaluation   Cervical / Trunk Assessment Cervical / Trunk Assessment: Normal   Communication Communication  Communication: No difficulties   Cognition Arousal/Alertness: Awake/alert Behavior During Therapy: WFL for tasks assessed/performed Overall Cognitive Status: Within Functional Limits for tasks assessed                                                 Home Living Family/patient expects to be discharged to:: Private residence Living Arrangements: Alone Available Help at Discharge: Family;Available PRN/intermittently(daughter lives in [place]; not readily available) Type of Home: House Home Access: Stairs to enter [employer] of Steps: 6 Entrance Stairs-Rails: Right;Left;Can reach both Home Layout: One level     Bathroom Shower/Tub: Teacher, early years/pre: Standard     Home Equipment: Museum/gallery conservator - 2 wheels;Cane - single point;Shower seat;Bedside commode          Prior Functioning/Environment Level of Independence: Needs assistance  Gait / Transfers Assistance Needed: Pt reports being unable to get up at home. Occasionally a neighbor would help her ADL's / Homemaking Assistance Needed: Pt reports not performing dressing, grooming, or bathing tasks since being home from previous admission.    Comments: Pt reports HH did not come out after prior admission. However according to chart review [place] RN came out and held services due to state of pt's home environment being unsafe and contacted APS.         OT Problem List: Decreased activity tolerance;Decreased strength;Decreased safety awareness;Decreased knowledge of use of DME or AE;Cardiopulmonary status limiting activity;Pain      OT Treatment/Interventions: Self-care/ADL training;Therapeutic exercise;Therapeutic activities;Patient/family education    OT Goals(Current goals can be found in the care plan section) Acute Rehab OT Goals Patient Stated Goal: To improve strength and independence to go home OT Goal Formulation: With patient Time For Goal Achievement: 06/07/18 Potential to Achieve Goals: Good  OT Frequency: Min 2X/week   Barriers to D/C: Inaccessible home environment;Decreased caregiver support  Per chart and pt discussion pt has no real assistance and is unable to complete tasks  independently. Per chart review pts home environment is unsafe and unhealthy       Co-evaluation PT/OT/SLP Co-Evaluation/Treatment: Yes Reason for Co-Treatment: Complexity of the patient's impairments (multi-system involvement)   OT goals addressed during session: ADL's and self-care         End of Session Equipment Utilized During Treatment: Gait belt;Rolling walker;Oxygen  Activity Tolerance: Patient limited by pain Patient left: in chair;with call bell/phone within reach  OT Visit Diagnosis: Muscle weakness (generalized) (M62.81);Pain Pain - Right/Left: (low back) Pain - part of body: (back)                Time: 2993-7169 OT Time Calculation (min): 28 min Charges:  OT General Charges $OT Visit: 1 Visit OT Evaluation $OT Eval Moderate Complexity: [place], OTR/L  [phone] 05/24/2018, 10:36 AM

## 2018-05-24 NOTE — Progress Notes (Signed)
CRITICAL VALUE ALERT  Critical Value:  PT/INR- 44.6/4.9  Date & Time Notied:  05/24/18 0935  Provider Notified: [place]  Orders Received/Actions taken: orders received

## 2018-05-24 NOTE — Progress Notes (Signed)
Initial Nutrition Assessment  DOCUMENTATION CODES:  Non-severe (moderate) malnutrition in context of social or environmental circumstances  INTERVENTION:  While on CL diet: -Will order 30 mL Prostat BID, each supplement provides 100 kcal and 15 grams of protein.  -Boost Breeze po TID, each supplement provides 250 kcal and 9 grams of protein  On diet advancement, Ensure Enlive po BID, each supplement provides 350 kcal and 20 grams of protein  mvi with minerals  Discussed social/environmental issues with CSW/CM  NUTRITION DIAGNOSIS:  Moderate Malnutrition related to social / environmental circumstances(unable to navigate house d/t functional deficits, lack of assistance) as evidenced by loss of >7.5% bw in 3 months and an estimated energy intake that has met <75% of needs for >/= to 3 months.   GOAL:  Patient will meet greater than or equal to 90% of their needs  MONITOR:  PO intake, Supplement acceptance, Labs, I & O's, Weight trends, Diet advancement  REASON FOR ASSESSMENT:  Consult COPD Protocol  ASSESSMENT:  68 y/o female PMHx CVD, Chronic Resp failure 2/2 COPD (2L @ baseline), Factor V deficiency and ongoing tobacco abuse. Presented to ED with SOB, increased fatigue and melena. Pt found to be anemic with Hgb of 6.6 and +FOBT. She was admitted for GIB and AoC resp failure.  Met with patient at lunch time. RD had noted a large amount of weight loss in chart. She was in the upper 160's early this year, but she is now presenting at 153 lbs. RD rechecked bedweight to confirm. She has lost ~15-17 lbs in the past 3 month. This is >8.5% of pts bodyweight and meets malnutrition criteria.   RD asked about recent changes in intake. She does endorse a decrease in her appetite recently. She says she is only eating once, maybe twice, a day. She does not take any oral supplements. She says she doesn't drink Ensure "because it has Vit K in it".   She admits to their being a  social/environmental component to her decline. She declined functionally and admits she cannot get around her house- this would limit food accessibility. She discloses that [facility] has actually been trying to remove her from her house d/t safety concerns. She says her house is littered with 02 tanks and animals. She reports a lack of assistance. She says she has "all the equipment at home that you have here in the hospital..ibuprofen just dont know how to use it". She has no one to help her with these devices. She says she has a Cpap and INR kit, both of which she doesn't use because she doesn't know how they work. She will try to read the instructions but quickly gets overwhelmed and frustrated and quits. She is aware of her deficits and voices desire to go to SNF for acute rehab  She denies nausea, vomiting. She endorse abdominal pain since being admitted to the hospital. She reports uncontinence of bowel/bladder. She says she cannot tell when she needs to urinate or defecate. She says this is new.   At this time, pt has GI eval pending and is restricted to CL. Will order Boost breeze and prostat while restricted. Rd calmed her fears regarding the Vit K in Ensure and she was agreeable to Ensure on diet advancement. RD noted he would pass on her social/environmental concerns to CM.   Labs:  Hgb: 6.6->8.8, Albumin: 2.7,  Meds: Iron,  Folate, Methylprednisolone, ppi, Omega 3s, IVF  Recent Labs  Lab 05/23/18 1027 05/24/18 0443  NA 143  138  K 3.5 4.2  CL 96* 95*  CO2 39* 36*  BUN 19 17  CREATININE 0.60 0.63  CALCIUM 7.8* 7.9*  GLUCOSE 94 132*   NUTRITION - FOCUSED PHYSICAL EXAM: Deferred  Diet Order:   Diet Order            Diet clear liquid Room service appropriate? Yes; Fluid consistency: Thin  Diet effective now             EDUCATION NEEDS:  Not appropriate for education at this time  Skin:   DTI buttocks,  Stage II PU to R buttocks  Last BM:  Unknown  Height:  Ht Readings  from Last 1 Encounters:  05/23/18 '5\' 6"'$  (1.676 m)   Weight:  Wt Readings from Last 1 Encounters:  05/24/18 69.4 kg   Wt Readings from Last 10 Encounters:  05/24/18 69.4 kg  02/21/18 81.2 kg  01/30/18 77.4 kg  01/23/18 74.5 kg  10/04/17 72.8 kg  09/07/17 76.3 kg  09/05/17 75.4 kg  08/30/17 73.3 kg  08/15/17 73.9 kg  05/16/17 72.3 kg   Ideal Body Weight:  59.1 kg  BMI:  Body mass index is 24.69 kg/m.  Estimated Nutritional Needs:  Kcal:  1800-2000 kcals (26-29 kcal/kg bw) Protein:  90-105g Pro (1.3-1.5g/kg bw) Fluid:  1.8-2L fluid ( 77m/kcal)  NBurtis JunesRD, LDN, CNSC Clinical Nutrition Available Tues-Sat via Pager: [phone]/15/2020 1:54 PM

## 2018-05-24 NOTE — Evaluation (Signed)
Physical Therapy Evaluation Patient Details Name: [name] MRN: [id number] DOB: 1950-10-27 Today's Date: 05/24/2018   History of Present Illness  [name] is a 68 y.o. 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  Patient reports symptom has been present for the last 3 to 4 days and worsening.  She has used home nebulizer without significant improvement.  Patient continues to smoke.  Patient reported intermittent episode of productive cough and also expressed positive/worsening wheezing.  Patient reports intermittent episode of melanotic stools and ongoing sensation of feeling weak and tired at all times.     Clinical Impression  Patient demonstrates slow labored movement and demonstrates fair/good return for logrolling to side and sitting up from side lying position using bed rail, mostly limited due to increasing low back pain with  Movement, at risk for falls due to poor standing balance, limited to a few steps to transfer to chair and BSC, but to fatigued to attempt ambulation any farther.  Patient tolerated sitting up in chair after therapy - RN notified.  Patient will benefit from continued physical therapy in hospital and recommended venue below to increase strength, balance, endurance for safe ADLs and gait.    Follow Up Recommendations SNF    Equipment Recommendations  3in1 (PT)    Recommendations for Other Services       Precautions / Restrictions Precautions Precautions: Fall Restrictions Weight Bearing Restrictions: No      Mobility  Bed Mobility Overal bed mobility: Needs Assistance Bed Mobility: Rolling;Sidelying to Sit Rolling: Min  assist Sidelying to sit: Mod assist       General bed mobility comments: increased time, increased pain in low back when sitting up, had to use bedrail  Transfers Overall transfer level: Needs assistance Equipment used: Rolling walker (2 wheeled) Transfers: Sit to/from [facility] Sit to Stand: Min assist Stand pivot transfers: Min assist;Mod assist       General transfer comment: slow labored movement, appeared to have difficulty breathing  Ambulation/Gait Ambulation/Gait assistance: Mod assist;Min assist Gait Distance (Feet): 6 Feet Assistive device: Rolling walker (2 wheeled) Gait Pattern/deviations: Decreased step length - right;Decreased step length - left;Decreased stride length Gait velocity: decreased   General Gait Details: limited to 7-8 slow labored steps at bedside due to c/o fatigue and generalized weakness  Stairs            Wheelchair Mobility    Modified Rankin (Stroke Patients Only)       Balance Overall balance assessment: Needs assistance Sitting-balance support: Feet supported;No upper extremity supported Sitting balance-Leahy Scale: Fair     Standing balance support: Bilateral upper extremity supported;During functional activity Standing balance-Leahy Scale: Fair Standing balance comment: using RW                             Pertinent Vitals/Pain Pain Assessment: Faces Pain Score: 10-Worst pain ever Faces Pain Scale: Hurts whole lot Pain Location: low back Pain Descriptors / Indicators: Aching;Grimacing;Moaning Pain Intervention(s): Limited activity within patient's tolerance;Monitored during session    Home Living Family/patient expects to be discharged to:: Private residence Living Arrangements: Alone Available Help at Discharge: Family;Available PRN/intermittently Type of Home: House Home Access: Stairs to enter Entrance Stairs-Rails: Right;Left;Can  reach both Entrance Stairs-Number of Steps: 6 in  front (rails to wide to reach both), 3 on the side (can reach both siderails) Home Layout: One level Home Equipment: Hand held shower head;Walker - 2 wheels;Cane - single point;Shower seat;Bedside commode;Walker - 4 wheels      Prior Function Level of Independence: Independent with assistive device(s)   Gait / Transfers Assistance Needed: Household ambulator with [facility]  ADL's / Homemaking Assistance Needed: Pt reports not performing dressing, grooming, or bathing tasks since being home from previous admission.   Comments: Pt reports HH did not come out after prior admission. However according to chart review [place] RN came out and held services due to state of pt's home environment being unsafe and contacted APS.      Hand Dominance   Dominant Hand: Right    Extremity/Trunk Assessment   Upper Extremity Assessment Upper Extremity Assessment: Defer to OT evaluation    Lower Extremity Assessment Lower Extremity Assessment: Generalized weakness    Cervical / Trunk Assessment Cervical / Trunk Assessment: Normal  Communication   Communication: No difficulties  Cognition Arousal/Alertness: Awake/alert Behavior During Therapy: WFL for tasks assessed/performed Overall Cognitive Status: Within Functional Limits for tasks assessed                                        General Comments      Exercises     Assessment/Plan    PT Assessment Patient needs continued PT services  PT Problem List Decreased strength;Decreased activity tolerance;Decreased balance;Decreased mobility       PT Treatment Interventions Therapeutic exercise;Gait training;Stair training;Functional mobility training;Therapeutic activities;Patient/family education    PT Goals (Current goals can be found in the Care Plan section)  Acute Rehab PT Goals Patient Stated Goal: To improve strength and independence to go home PT Goal Formulation: With patient Time For Goal Achievement:  06/07/18 Potential to Achieve Goals: Good    Frequency Min 3X/week   Barriers to discharge        Co-evaluation PT/OT/SLP Co-Evaluation/Treatment: Yes Reason for Co-Treatment: Complexity of the patient's impairments (multi-system involvement) PT goals addressed during session: Mobility/safety with mobility;Proper use of DME;Balance;Strengthening/ROM OT goals addressed during session: ADL's and self-care       AM-PAC PT "6 Clicks" Mobility  Outcome Measure Help needed turning from your back to your side while in a flat bed without using bedrails?: A Little Help needed moving from lying on your back to sitting on the side of a flat bed without using bedrails?: A Lot Help needed moving to and from a bed to a chair (including a wheelchair)?: A Lot Help needed standing up from a chair using your arms (e.g., wheelchair or bedside chair)?: A Lot Help needed to walk in hospital room?: A Lot Help needed climbing 3-5 steps with a railing? : A Lot 6 Click Score: 13    End of Session Equipment Utilized During Treatment: Oxygen Activity Tolerance: Patient tolerated treatment well;Patient limited by fatigue;Patient limited by pain Patient left: in chair;with call bell/phone within reach Nurse Communication: Mobility status PT Visit Diagnosis: Unsteadiness on feet (R26.81);Other abnormalities of gait and mobility (R26.89);Muscle weakness (generalized) (M62.81)    Time: 6226-3335 PT Time Calculation (min) (ACUTE ONLY): 32 min   Charges:   PT Evaluation $PT Eval Moderate Complexity: 1 Mod PT Treatments $Therapeutic Activity: 23-37 mins        11:38 AM, 05/24/18 Jeneen Rinks  Chrissie Noa, MPT Physical Therapist with [facility] 336 [phone] office [phone] mobile phone

## 2018-05-24 NOTE — Progress Notes (Signed)
PROGRESS NOTE    [name]  IRS:[id number] DOB: 02-21-1950 DOA: 05/23/2018 PCP: [name], NP     Brief Narrative:  68 y.o. 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  Patient reports symptom has been present for the last 3 to 4 days and worsening.  She has used home nebulizer without significant improvement.  Patient continues to smoke.  Patient reported intermittent episode of productive cough and also expressed positive/worsening wheezing.  Patient reports intermittent episode of melanotic stools and ongoing sensation of feeling weak and tired at all times.  Patient denies fever, chills, nausea and vomiting; there has not been abdominal pain, no headaches, no chest pain, no diaphoresis, no focal weakness, no dysuria or hematuria. In the ED chest x-ray demonstrated chronic bronchitic/emphysematous changes without acute infiltrates. COVID-19 test neg. No acute ischemic changes on EKG, neg troponin and normal BNP. Hgb down to 6.6 and positive FOBT. ABG with compensated hypercapnia.  She received fluid resuscitation, type and cross with order to initiate transfusion, nebulizer treatment and started on BiPAP to assist with her shortness of breath and hypercapnia.  [facility] has been called to admit patient for further evaluation and management of COPD exacerbation and GI bleed.   Assessment & Plan: 1-Acute on chronic respiratory failure in the setting of COPD exacerbation with component of hypercapnia and hypoxia; patient also with low hemoglobin most likely plan will increase shortness of breath. -Continue IV steroids, nebulizer management, Pulmicort, Brovana, flutter valve  and Mucinex. -No antibiotics will be given as there was no infiltrates appreciated on x-ray. -Patient is afebrile and with normal WBCs. -Repeat ABG orders have been placed  -Initiate nightly CPAP  -Follow clinical response.  2-acute blood loss anemia/melena -GI has been consulted and will follow recommendations (patient will most likely benefit of EGD). -INR is still elevated for any procedures -Continue full liquid diet only -Continue PPI -After 2 units of PRBCs hemoglobin went up to 8.8 (from 6.6 on admission). -Continue to follow hemoglobin trend intermittently -Vitamin K will be given today to assist with INR reversal.  3-hypothyroidism -Continue Synthroid  4-tobacco abuse -Cessation counseling has been once again encouraged -Patient has declined the use of nicotine patch.  5-factor V deficiency: With past history of DVT/PE. -Chronically on Coumadin -On presentation INR supratherapeutic, still this morning is up to 4.4 -Will provide 2.5 mg of vitamin K and follow trend. -For now we will use SCDs for DVT prophylaxis.  6-depression/anxiety -No suicidal ideation or hallucinations -Continue the use of Celexa and bupropion.  7-chronic pain syndrome -Continue to use Vicodin for breakthrough pain. -For now will continue holding fentanyl patch.  8-physical deconditioning -Physical therapy has evaluated patient and recommended skilled nursing facility for rehabilitation and conditioning -TOC aware and assisting with placement.  9-stage II pressure injury -Continue preventive measures and frequent repositioning.  10-moderate protein-calorie malnutrition -Follow dietitian services recommendations for feeding supplements.  DVT prophylaxis: SCDs Code Status: Full code Family Communication: No family at bedside. Disposition Plan: Remains in a stepdown, will initiate nightly CPAP, continue nebulizer treatments, IV steroids, flutter valve, Pulmicort and Brovana.  Patient is  status post 2 units of PRBCs with improvement in her hemoglobin to 8.8.  INR is still elevated.  Consultants:   Gastroenterology service  Procedures:  See below for x-ray reports.  Antimicrobials:  Anti-infectives (From admission, onward)   None      Subjective: Still with mild difficulty speaking in full sentences (even overall improved from admission), no fever, no chest pain, no nausea, no vomiting.  Reports abdominal discomfort and constipation.  Still feeling fatigued.  Objective: Vitals:   05/24/18 0800 05/24/18 0900 05/24/18 1128 05/24/18 1321  BP: 122/61 (!) 109/49    Pulse: 80 85    Resp: 17 18    Temp: 99.1 F (37.3 C)  97.9 F (36.6 C)   TempSrc: Axillary  Axillary   SpO2: 100% 99%  96%  Weight:      Height:        Intake/Output Summary (Last 24 hours) at 05/24/2018 1552 Last data filed at 05/24/2018 1500 Gross per 24 hour  Intake 1275.96 ml  Output 600 ml  Net 675.96 ml   Filed Weights   05/23/18 0859 05/23/18 1507 05/24/18 0500  Weight: 77.1 kg 68.1 kg 69.4 kg    Examination: General exam: Alert, awake, oriented x 3, reports an improvement in her breathing, still complaining of feeling tired.  Now having some abdominal discomfort and constipation.  No nausea or vomiting.  Patient is afebrile and having very little difficulty speaking in full sentences at this time.[name] Respiratory system: Positive rhonchi right, mild expiratory wheezing, improved air movement, no using accessory muscles. Cardiovascular system:RRR. No murmurs, rubs, gallops. Gastrointestinal system: Abdomen is nondistended, soft and nontender. No organomegaly or masses felt. Normal bowel sounds heard. Central nervous system: Alert and oriented. No focal neurological deficits. Extremities: No cyanosis or clubbing, trace edema bilaterally. Skin: No rashes, no petechiae; stage 2 buttocks pressure injury present on admission, without signs of superimposed infection. Psychiatry: Mood & affect  appropriate.    Data Reviewed: I have personally reviewed following labs and imaging studies  CBC: Recent Labs  Lab 05/23/18 1027 05/24/18 0443  WBC 4.7 3.1*  NEUTROABS 3.3  --   HGB 6.6* 8.8*  HCT 22.3* 28.3*  MCV 97.0 94.6  PLT 203 951   Basic Metabolic Panel: Recent Labs  Lab 05/23/18 1027 05/24/18 0443  NA 143 138  K 3.5 4.2  CL 96* 95*  CO2 39* 36*  GLUCOSE 94 132*  BUN 19 17  CREATININE 0.60 0.63  CALCIUM 7.8* 7.9*   GFR: Estimated Creatinine Clearance: 63 mL/min (by C-G formula based on SCr of 0.63 mg/dL).   Liver Function Tests: Recent Labs  Lab 05/24/18 0443  AST 13*  ALT 9  ALKPHOS 63  BILITOT 0.5  PROT 5.7*  ALBUMIN 2.7*   Coagulation Profile: Recent Labs  Lab 05/23/18 1027 05/24/18 0443 05/24/18 0826  INR 5.0* 4.9* 4.4*   Cardiac Enzymes: Recent Labs  Lab 05/23/18 1027  TROPONINI <0.03   Urine analysis:    Component Value Date/Time   COLORURINE YELLOW 05/23/2018 [place] 05/23/2018 0934   LABSPEC 1.014 05/23/2018 [place] 7.0 05/23/2018 0934   GLUCOSEU NEGATIVE 05/23/2018 0934   HGBUR SMALL (A) 05/23/2018 0934   BILIRUBINUR NEGATIVE 05/23/2018 0934   BILIRUBINUR n 09/21/2016 1432   KETONESUR NEGATIVE 05/23/2018 0934   PROTEINUR 30 (A) 05/23/2018 0934   UROBILINOGEN 0.2 09/21/2016 1432   UROBILINOGEN 0.2 04/13/2009 2025   NITRITE NEGATIVE 05/23/2018 0934   LEUKOCYTESUR NEGATIVE 05/23/2018 0934    Recent Results (from the past 240 hour(s))  SARS Coronavirus 2 (CEPHEID- Performed in [facility] hospital lab), [facility]  Status: None   Collection Time: 05/23/18 10:00 AM  Result Value Ref Range Status   SARS Coronavirus 2 NEGATIVE NEGATIVE Final    Comment: (NOTE) If result is NEGATIVE SARS-CoV-2 target nucleic acids are NOT DETECTED. The SARS-CoV-2 RNA is generally detectable in upper and lower  respiratory specimens during the acute phase of infection. The lowest  concentration of SARS-CoV-2  viral copies this assay can detect is 250  copies / mL. A negative result does not preclude SARS-CoV-2 infection  and should not be used as the sole basis for treatment or other  patient management decisions.  A negative result may occur with  improper specimen collection / handling, submission of specimen other  than nasopharyngeal swab, presence of viral mutation(s) within the  areas targeted by this assay, and inadequate number of viral copies  (<250 copies / mL). A negative result must be combined with clinical  observations, patient history, and epidemiological information. If result is POSITIVE SARS-CoV-2 target nucleic acids are DETECTED. The SARS-CoV-2 RNA is generally detectable in upper and lower  respiratory specimens dur ing the acute phase of infection.  Positive  results are indicative of active infection with SARS-CoV-2.  Clinical  correlation with patient history and other diagnostic information is  necessary to determine patient infection status.  Positive results do  not rule out bacterial infection or co-infection with other viruses. If result is PRESUMPTIVE POSTIVE SARS-CoV-2 nucleic acids MAY BE PRESENT.   A presumptive positive result was obtained on the submitted specimen  and confirmed on repeat testing.  While 2019 novel coronavirus  (SARS-CoV-2) nucleic acids may be present in the submitted sample  additional confirmatory testing may be necessary for epidemiological  and / or clinical management purposes  to differentiate between  SARS-CoV-2 and other Sarbecovirus currently known to infect humans.  If clinically indicated additional testing with an alternate test  methodology [phone]) is advised. The SARS-CoV-2 RNA is generally  detectable in upper and lower respiratory sp ecimens during the acute  phase of infection. The expected result is Negative. Fact Sheet for Patients:  StrictlyIdeas.no Fact Sheet for Healthcare  Providers: BankingDealers.co.za This test is not yet approved or cleared by the [place] FDA and has been authorized for detection and/or diagnosis of SARS-CoV-2 by FDA under an Emergency Use Authorization (EUA).  This EUA will remain in effect (meaning this test can be used) for the duration of the COVID-19 declaration under Section 564(b)(1) of the Act, 21 U.S.C. section 360bbb-3(b)(1), unless the authorization is terminated or revoked sooner. Performed at [facility], [address], [place], [place] [address]   MRSA PCR Screening     Status: None   Collection Time: 05/23/18  3:03 PM  Result Value Ref Range Status   MRSA by PCR NEGATIVE NEGATIVE Final    Comment:        The GeneXpert MRSA Assay (FDA approved for NASAL specimens only), is one component of a comprehensive MRSA colonization surveillance program. It is not intended to diagnose MRSA infection nor to guide or monitor treatment for MRSA infections. Performed at [facility], [address] [place], [place] [address]      Radiology Studies: Dg Chest Portable 1 View  Result Date: 05/23/2018 CLINICAL DATA:  Shortness of breath EXAM: PORTABLE CHEST 1 VIEW COMPARISON:  February 15, 2018 chest radiograph; chest CT November 14, 2017. FINDINGS: There is mild scarring in the left base. There remains interstitial thickening bilaterally. There is no frank edema or consolidation. Heart is  slightly enlarged with pulmonary vascularity normal. No adenopathy. No bone lesions. IMPRESSION: Scarring left base. Interstitial thickening, stable. Underlying emphysematous change better seen on prior CT. No edema or consolidation. Stable cardiac prominence. Emphysema (ICD10-J43.9). Electronically Signed   By: Lowella Grip III M.D.   On: 05/23/2018 09:47    Scheduled Meds:  arformoterol  15 mcg Nebulization BID   budesonide (PULMICORT) nebulizer solution  0.5 mg Nebulization BID   buPROPion  100 mg Oral q  morning - 10a   citalopram  10 mg Oral Daily   dextromethorphan-guaiFENesin  1 tablet Oral BID   feeding supplement  1 Container Oral BID BM   feeding supplement (PRO-STAT SUGAR FREE 64)  30 mL Oral BID   ferrous sulfate  325 mg Oral Q breakfast   folic acid  1 mg Oral Daily   ipratropium-albuterol  3 mL Nebulization Q6H   levothyroxine  125 mcg Oral Q0600   methylPREDNISolone (SOLU-MEDROL) injection  60 mg Intravenous Q12H   multivitamin with minerals  1 tablet Oral Daily   nicotine  21 mg Transdermal Daily   omega-3 acid ethyl esters  1 capsule Oral Daily   pantoprazole (PROTONIX) IV  40 mg Intravenous Q12H   polyethylene glycol  17 g Oral Daily   Continuous Infusions:  sodium chloride Stopped (05/23/18 1538)   sodium chloride 10 mL/hr at 05/24/18 0100     LOS: 1 day    Time spent: 35 minutes. Greater than 50% of this time was spent in direct contact with the patient, coordinating care and discussing relevant ongoing clinical issues, including discussion about hypercapnia, COPD exacerbation causing increased shortness of breath and also low hemoglobin on presentation.  Patient reported intermittent episodes of black stools once again but had now expressed couple of days without achieving any bowel movement at all and having some mild discomfort in her mid abdomen.  Patient requesting for something to assist with constipation.    Barton Dubois, MD [facility] Pager [phone]   05/24/2018, 3:52 PM

## 2018-05-25 DIAGNOSIS — K921 Melena: Secondary | ICD-10-CM

## 2018-05-25 LAB — CBC
HCT: 28.3 % — ABNORMAL LOW (ref 36.0–46.0)
Hemoglobin: 8.8 g/dL — ABNORMAL LOW (ref 12.0–15.0)
MCH: 29.6 pg (ref 26.0–34.0)
MCHC: 31.1 g/dL (ref 30.0–36.0)
MCV: 95.3 fL (ref 80.0–100.0)
Platelets: 227 10*3/uL (ref 150–400)
RBC: 2.97 MIL/uL — ABNORMAL LOW (ref 3.87–5.11)
RDW: 15.6 % — ABNORMAL HIGH (ref 11.5–15.5)
WBC: 6.5 10*3/uL (ref 4.0–10.5)
nRBC: 0 % (ref 0.0–0.2)

## 2018-05-25 LAB — PROTIME-INR
INR: 5.2 (ref 0.8–1.2)
Prothrombin Time: 46.9 seconds — ABNORMAL HIGH (ref 11.4–15.2)

## 2018-05-25 LAB — HIV ANTIBODY (ROUTINE TESTING W REFLEX): HIV Screen 4th Generation wRfx: NONREACTIVE

## 2018-05-25 MED ORDER — VITAMIN K1 10 MG/ML IJ SOLN
3.0000 mg | Freq: Once | INTRAVENOUS | Status: AC
  Administered 2018-05-25: 3 mg via INTRAVENOUS
  Filled 2018-05-25: qty 0.3

## 2018-05-25 MED ORDER — METHYLPREDNISOLONE SODIUM SUCC 40 MG IJ SOLR
40.0000 mg | Freq: Two times a day (BID) | INTRAMUSCULAR | Status: DC
  Administered 2018-05-25 – 2018-05-28 (×6): 40 mg via INTRAVENOUS
  Filled 2018-05-25 (×6): qty 1

## 2018-05-25 MED ORDER — ORAL CARE MOUTH RINSE
15.0000 mL | Freq: Two times a day (BID) | OROMUCOSAL | Status: DC
  Administered 2018-05-25 – 2018-05-29 (×3): 15 mL via OROMUCOSAL

## 2018-05-25 NOTE — Consult Note (Signed)
Referring Provider: No ref. provider found Primary Care Physician:  [name], NP Primary Gastroenterologist:  Dr. Laural Golden  Reason for Consultation:   Melena and anemia.  HPI:   Patient is 68 year old Caucasian female with multiple medical problems including O2 dependent COPD factor V Leiden deficiency maintained on warfarin and ticagrelor who presented to emergency room 2 days ago with shortness of breath and found to have low hemoglobin and heme positive stool.  Her hemoglobin is 6.6 g.  She has received 2 units of PRBCs and hemoglobin is up to 8.8 g.  Patient states her breathing is back to baseline.  She states she has been on oxygen for several years.  On admission her INR was 5.  She received 2.5 mg of p.o. phytonadione yesterday. Patient states she has been passing black stools for about a week.  She is prone to constipation.  Since her stools have been black her stool has been loose.  She has not passed fresh blood per rectum.  She denies abdominal pain.  She says heartburn is well controlled with therapy.  She denies dysphagia nausea vomiting or abdominal pain.  She does not have good appetite.  She states she has lost 70 pounds over the last few years.  She says she used to weigh 220 pounds and now she weighs 150 pounds. Patient does not take OTC NSAIDs.  There is no history of peptic ulcer disease.  Last colonoscopy was about 16 years ago.  She is divorced.  She lives alone.  She states she has been on disability since 2009.  She does not prepare meals.  She generally eats frozen meals and her neighbor brings her food every evening.  She has a driver's license but has not driven lately.  She has been smoking for close to 50 years.  She states she quit cigarette smoking for 4 months but has gone back to smoking again.  She has been smoking half to 1 pack of cigarettes daily.  She does not drink alcohol.  All in all she worked for about 30 years before disability.  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 She has 2 grownup children.  Her daughter lives in [place] and son lives in [place].  She says she does not see them often but stays in touch with them via Internet.  Past Medical History:  Diagnosis Date  . Acute MI ([place])    x4, code blue x3  . Arteriosclerotic cardiovascular disease (ASCVD) 2002   Inf STEMI-2002. 2003-cutting balloon + brachytherapy for restenosis; subsequent acute stent thrombosis 06/2010 requiring 2 separate interventions (Zeta stent, then repeat cath with thrombectomy). focal basal inf AK, nl EF; 03/2011: Patent stents, minor nonobst  residual dz, nl EF; neg stress nuclear in 2008 and stress echo in 2009  . Chronic anticoagulation    Warfarin plus ticagrelor  . Chronic respiratory failure ([place]) 08/27/2013   On 2L 02  . COPD (chronic obstructive pulmonary disease) ([place])    02 dependent  . COPD (chronic obstructive pulmonary disease) ([place])   . Factor 5 Leiden mutation, heterozygous ([place])   . Factor V Leiden, prothrombin gene mutation ([place]) 2006  . Hyperlipidemia   . Hypothyroidism   . Noncompliance   . Pelvic fracture ([place]) 2009  . Pulmonary embolism ([place]) 2006   Associated with deep vein thrombosis-2006; + factor V Leiden  .  Tobacco abuse    50 pack years    Past Surgical History:  Procedure Laterality Date  . COLONOSCOPY  Approximately 2000   Negative screening study  . CORONARY ANGIOPLASTY  2002, 2003, 2012  . LEFT AND RIGHT HEART CATHETERIZATION WITH CORONARY ANGIOGRAM N/A 04/03/2011   Procedure: LEFT AND RIGHT HEART CATHETERIZATION WITH CORONARY ANGIOGRAM;  Surgeon: Sherren Mocha, MD;  Location: [facility] CATH LAB;  Service: Cardiovascular;  Laterality: N/A;    Prior to Admission medications   Medication Sig Start Date End Date Taking? Authorizing Provider  albuterol (PROVENTIL) (2.5 MG/3ML) 0.083% nebulizer solution USE 1 VIAL BY NEBULIZER EVERY 4  HOURS AS NEEDED FOR WHEEZING. DX: J44.9 Patient taking differently: Take 2.5 mg by nebulization every 4 (four) hours as needed for wheezing or shortness of breath.  01/24/17  Yes Magdalen Spatz, NP  arformoterol (BROVANA) 15 MCG/2ML NEBU Take 2 mLs (15 mcg total) by nebulization 2 (two) times daily. 09/07/17  Yes Juanito Doom, MD  baclofen (LIORESAL) 10 MG tablet Take 1 tablet (10 mg total) by mouth 3 (three) times daily. 01/24/18  Yes Lavina Hamman, MD  budesonide (PULMICORT) 0.5 MG/2ML nebulizer solution Take 2 mLs (0.5 mg total) by nebulization 2 (two) times daily. Dx: J43.9 01/24/17  Yes Magdalen Spatz, NP  buPROPion [facility] SR) 100 MG 12 hr tablet Take 100 mg by mouth every morning.   Yes [provider]  citalopram (CELEXA) 10 MG tablet Take 1 tablet by mouth daily. 04/29/18  Yes [provider]  fentaNYL (DURAGESIC) 50 MCG/HR Place 1 patch onto the skin every 3 (three) days. 02/19/18  Yes Johnson, Clanford L, MD  ferrous sulfate 324 (65 Fe) MG TBEC Take 1 tablet (325 mg total) by mouth daily with breakfast. 02/21/18  Yes Johnson, Clanford L, MD  folic acid (FOLVITE) 1 MG tablet Take 1 tablet (1 mg total) by mouth daily. 02/21/18 02/21/19 Yes Johnson, Clanford L, MD  HYDROcodone-acetaminophen (NORCO/VICODIN) 5-325 MG tablet Take 1 tablet by mouth every 6 (six) hours as needed (BREAKTHROUGH PAIN). 02/19/18  Yes Johnson, Clanford L, MD  ipratropium-albuterol (DUONEB) 0.5-2.5 (3) MG/3ML SOLN Take 3 mLs by nebulization 4 (four) times daily. 03/20/17  Yes Magdalen Spatz, NP  levothyroxine (SYNTHROID, LEVOTHROID) 125 MCG tablet Take 1 tablet (125 mcg total) by mouth daily. 08/16/17  Yes Nafziger, Tommi Rumps, NP  nitroGLYCERIN (NITROSTAT) 0.4 MG SL tablet Place 1 tablet (0.4 mg total) under the tongue every 5 (five) minutes as needed for chest pain. 10/28/14  Yes Herminio Commons, MD  Omega-3 Fatty Acids (FISH OIL) 600 MG CAPS Take 1 capsule by mouth daily.    Yes [provider]   Respiratory Therapy Supplies (FLUTTER) DEVI Use as directed 05/14/15  Yes Javier Glazier, MD  ticagrelor (BRILINTA) 60 MG TABS tablet Take 1 tablet (60 mg total) by mouth 2 (two) times daily. 02/01/18  Yes Johnson, Clanford L, MD  traZODone (DESYREL) 50 MG tablet Take 50 mg by mouth at bedtime as needed for sleep.   Yes [provider]  warfarin (COUMADIN) 1 MG tablet Take 1 mg by mouth See admin instructions. Alternates taking 6mg  with 5mg  daily. Takes 5mg  on Mondays, Wednesdays, and Fridays. Takes 6mg  on all other days   Yes [provider]  warfarin (COUMADIN) 5 MG tablet TAKE 1 TABLET BY MOUTH ONCE DAILY 03/11/18  Yes Herminio Commons, MD  lidocaine (LIDODERM) 5 % Place 1 patch onto the skin daily. Remove & Discard patch  within 12 hours or as directed by MD Patient not taking: Reported on 05/23/2018 01/24/18   Lavina Hamman, MD  pantoprazole (PROTONIX) 40 MG tablet Take 1 tablet (40 mg total) by mouth daily. 02/20/18   Johnson, Clanford L, MD  polyethylene glycol (MIRALAX / GLYCOLAX) packet Take 17 g by mouth daily. Patient not taking: Reported on 02/15/2018 01/25/18   Lavina Hamman, MD    Current Facility-Administered Medications  Medication Dose Route Frequency Provider Last Rate Last Dose  . 0.9 %  sodium chloride infusion  10 mL/hr Intravenous Once Francine Graven, DO   Stopped at 05/23/18 1538  . 0.9 %  sodium chloride infusion   Intravenous Continuous Barton Dubois, MD 50 mL/hr at 05/25/18 0725    . arformoterol (BROVANA) nebulizer solution 15 mcg  15 mcg Nebulization BID Barton Dubois, MD   15 mcg at 05/25/18 0748  . bisacodyl (DULCOLAX) EC tablet 5 mg  5 mg Oral Daily PRN Barton Dubois, MD      . budesonide (PULMICORT) nebulizer solution 0.5 mg  0.5 mg Nebulization BID Barton Dubois, MD   0.5 mg at 05/25/18 0748  . buPROPion (WELLBUTRIN SR) 12 hr tablet 100 mg  100 mg Oral q morning - 10a Barton Dubois, MD   100 mg at 05/24/18 0955  . citalopram (CELEXA)  tablet 10 mg  10 mg Oral Daily Barton Dubois, MD   10 mg at 05/24/18 0956  . dextromethorphan-guaiFENesin (MUCINEX DM) 30-600 MG per 12 hr tablet 1 tablet  1 tablet Oral BID Barton Dubois, MD   1 tablet at 05/24/18 2111  . feeding supplement (BOOST / RESOURCE BREEZE) liquid 1 Container  1 Container Oral BID BM Barton Dubois, MD   1 Container at 05/24/18 1500  . feeding supplement (PRO-STAT SUGAR FREE 64) liquid 30 mL  30 mL Oral BID Barton Dubois, MD   30 mL at 05/24/18 2110  . fentaNYL (SUBLIMAZE) injection 100 mcg  100 mcg Intravenous Q2H PRN Reubin Milan, MD      . ferrous sulfate tablet 325 mg  325 mg Oral Q breakfast Barton Dubois, MD   325 mg at 05/24/18 0831  . folic acid (FOLVITE) tablet 1 mg  1 mg Oral Daily Barton Dubois, MD   1 mg at 05/24/18 0955  . HYDROcodone-acetaminophen (NORCO/VICODIN) 5-325 MG per tablet 1 tablet  1 tablet Oral Q8H PRN Barton Dubois, MD   1 tablet at 05/24/18 0954  . ipratropium-albuterol (DUONEB) 0.5-2.5 (3) MG/3ML nebulizer solution 3 mL  3 mL Nebulization Q6H Barton Dubois, MD   3 mL at 05/25/18 0749  . levothyroxine (SYNTHROID) tablet 125 mcg  125 mcg Oral Q0600 Barton Dubois, MD   125 mcg at 05/25/18 4196  . MEDLINE mouth rinse  15 mL Mouth Rinse BID Barton Dubois, MD      . methylPREDNISolone sodium succinate (SOLU-MEDROL) 125 mg/2 mL injection 60 mg  60 mg Intravenous Q12H Barton Dubois, MD   60 mg at 05/24/18 2058  . multivitamin with minerals tablet 1 tablet  1 tablet Oral Daily Barton Dubois, MD   1 tablet at 05/24/18 1459  . nicotine (NICODERM CQ - dosed in mg/24 hours) patch 21 mg  21 mg Transdermal Daily Barton Dubois, MD   21 mg at 05/23/18 1805  . omega-3 acid ethyl esters (LOVAZA) capsule 1 g  1 capsule Oral Daily Barton Dubois, MD   1 g at 05/24/18 0956  . pantoprazole (PROTONIX) injection 40 mg  40 mg  Intravenous Wynelle Bourgeois, MD   40 mg at 05/24/18 2111  . polyethylene glycol (MIRALAX / GLYCOLAX) packet 17 g  17 g Oral  Daily Barton Dubois, MD      . traZODone (DESYREL) tablet 50 mg  50 mg Oral QHS PRN Barton Dubois, MD   50 mg at 05/23/18 2235    Allergies as of 05/23/2018 - Review Complete 05/23/2018  Allergen Reaction Noted  . Dilaudid [hydromorphone hcl] Hives and Nausea Only 08/24/2013  . Minocycline hcl    . Prednisone    . Varenicline tartrate    . Zocor [simvastatin - high dose] Other (See Comments) 12/06/2010    Family History  Problem Relation Age of Onset  . Emphysema Mother   . Alzheimer's disease Mother   . Emphysema Sister   . Heart disease Father   . Factor V Leiden deficiency Father   . Factor V Leiden deficiency Sister   . Factor V Leiden deficiency Daughter   . Kidney cancer Paternal Grandmother     Social History   Socioeconomic History  . Marital status: Single    Spouse name: Not on file  . Number of children: Not on file  . Years of education: Not on file  . Highest education level: Not on file  Occupational History  . Occupation: Disabled  . 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 Financial resource strain: Patient refused  . Food insecurity:    Worry: Patient refused    Inability: Patient refused  . Transportation needs:    Medical: Patient refused    Non-medical: Patient refused  Tobacco Use  . Smoking status: Current Some Day Smoker    Packs/day: 0.50    Years: 50.00    Pack years: 25.00    Types: Cigarettes    Start date: 05/10/1968  . Smokeless tobacco: Never Used  Substance and Sexual Activity  . Alcohol use: No    Alcohol/week: 0.0 standard drinks  . Drug use: No  . Sexual activity: Not on file  Lifestyle  . Physical activity:    Days per week: Patient refused    Minutes per session: Patient refused  . Stress: Not on file  Relationships  . Social connections:    Talks on phone: Patient refused    Gets together: Patient refused    Attends religious service: Patient refused    Active member of club or organization: Patient refused     Attends meetings of clubs or organizations: Patient refused    Relationship status: Patient refused  . Intimate partner violence:    Fear of current or ex partner: Patient refused    Emotionally abused: Patient refused    Physically abused: Patient refused    Forced sexual activity: Patient refused  Other Topics Concern  . Not on file  Social History Narrative   Originally from [place]. Previously has lived in [place], [place], [place] & moved back to [place] in 1994. 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 Ronie Spies was in her current home. She also had an owl living in her house as a rehab pet. She also had chickens until Fall 2015. No mold exposure.     Review of Systems: See HPI, otherwise normal ROS  Physical Exam: Temp:  [97.8 F (36.6 C)-98.4 F (36.9 C)] 98 F (36.7 C) (05/16 0810) Pulse Rate:  [75-97] 75 (05/16 0600) Resp:  [16-24] 21 (05/16 0600)  BP: (93-131)/(37-64) 131/58 (05/16 0600) SpO2:  [90 %-99 %] 98 % (05/16 0749) FiO2 (%):  [40 %] 40 % (05/16 0056) Last BM Date: (unsure of last BM)  Patient is alert and in no acute distress. She is on nasal O2. Conjunctiva is pale.  Sclerae nonicteric. Oropharyngeal mucosa is dry.  She is edentulous and lower jaw and has upper dental plate in position. No thyromegaly or lymphadenopathy noted. Cardiac exam with regular rhythm normal S1 and S2.  No murmur or gallop noted. Auscultation of lungs reveal diminished intensity of breath sounds bilaterally.  No rales or rhonchi noted. Abdomen is symmetrical bowel sounds are normal.  On palpation it is soft and nontender with no organomegaly or masses. She has external catheter in place. He has trace edema around left ankle.  She has multiple small ecchymoses over her forearms more on the right than on the left. She also sacral decubitus ulcer which is covered and was not examined.  According to nursing staff it is stage  II.   Lab Results: Recent Labs    05/23/18 1027 05/24/18 0443 05/25/18 0441  WBC 4.7 3.1* 6.5  HGB 6.6* 8.8* 8.8*  HCT 22.3* 28.3* 28.3*  PLT 203 207 227   BMET Recent Labs    05/23/18 1027 05/24/18 0443  NA 143 138  K 3.5 4.2  CL 96* 95*  CO2 39* 36*  GLUCOSE 94 132*  BUN 19 17  CREATININE 0.60 0.63  CALCIUM 7.8* 7.9*   LFT Recent Labs    05/24/18 0443  PROT 5.7*  ALBUMIN 2.7*  AST 13*  ALT 9  ALKPHOS 63  BILITOT 0.5   PT/INR Recent Labs    05/24/18 0443 05/24/18 0826  LABPROT 44.6* 41.6*  INR 4.9* 4.4*    Assessment;  Patient is 68 year old Caucasian female with multiple medical problems including factor V Leiden deficiency who has been on warfarin and ticagrelor who presented presented to emergency room 2 days ago with shortness of breath and found to have hemoglobin of 6.6 g.  She has received 2 units of PRBCs and her hemoglobin has leveled off at 8.8 g.  Her stool was guaiac positive and she gives at least 1 week history of melena.  Her INR was supra therapeutic on admission and she was given 2.5 mg of phytonadione.  INR from this morning is pending Patient has chronic GERD and maintained on a PPI.  No history of peptic ulcer disease. [place] diagnoses include peptic ulcer disease or GI angiodysplasia.  Since she has been having melena it would be reasonable to examine her upper GI tract.  If upper GI tract is unremarkable neck step in her work-up would be colonoscopy.  As far as patient's COPD is concerned she appears to be at baseline and she appears to be stable for EGD but she will need monitored anesthesia care.   Recommendations;  Wait for results of INR. Will proceed with diagnostic esophagogastroduodenoscopy if INR is less than 2.0. Patient is agreeable to proceed with this procedure for which she will need monitored anesthesia care.   LOS: 2 days   Cahlil Sattar  05/25/2018, 9:20 AM

## 2018-05-25 NOTE — Progress Notes (Signed)
PROGRESS NOTE    [name]  HQI:[id number] DOB: [birth year] DOA: 05/23/2018 PCP: [name], NP     Brief Narrative:  68 y.o. 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  Patient reports symptom has been present for the last 3 to 4 days and worsening.  She has used home nebulizer without significant improvement.  Patient continues to smoke.  Patient reported intermittent episode of productive cough and also expressed positive/worsening wheezing.  Patient reports intermittent episode of melanotic stools and ongoing sensation of feeling weak and tired at all times.  Patient denies fever, chills, nausea and vomiting; there has not been abdominal pain, no headaches, no chest pain, no diaphoresis, no focal weakness, no dysuria or hematuria. In the ED chest x-ray demonstrated chronic bronchitic/emphysematous changes without acute infiltrates. COVID-19 test neg. No acute ischemic changes on EKG, neg troponin and normal BNP. Hgb down to 6.6 and positive FOBT. ABG with compensated hypercapnia.  She received fluid resuscitation, type and cross with order to initiate transfusion, nebulizer treatment and started on BiPAP to assist with her shortness of breath and hypercapnia.  TRH has been called to admit patient for further evaluation and management of COPD exacerbation and GI bleed.   Assessment & Plan: 1-Acute on chronic respiratory failure in the setting of COPD exacerbation with component of hypercapnia and hypoxia; patient also with low hemoglobin most likely playing a role in her increased shortness of breath. -Continue IV steroids (with initiation of tapering), nebulizer  management, Pulmicort, Brovana, flutter valve and Mucinex. -No antibiotics will be given as there was no infiltrates appreciated on x-ray. -Patient remains afebrile and with normal WBCs. -Continue nightly CPAP  -Follow clinical response.  2-acute blood loss anemia/melena -GI has been consulted and will follow recommendations (patient will most likely benefit of EGD). -INR is still elevated for any procedures; planning EGD after INR less than 2. -Diet advanced as per GI service recommendations. -Continue PPI -After 2 units of PRBCs hemoglobin has remained stable at 8.8 (was 6.6 on admission). -Continue to follow hemoglobin trend intermittently -Vitamin K will be given again today to assist with INR reversal.  3-hypothyroidism -Continue Synthroid  4-tobacco abuse -Cessation counseling has been once again encouraged -Patient has declined the use of nicotine patch.  5-factor V deficiency: With past history of DVT/PE. -Chronically on Coumadin -On presentation INR supratherapeutic, still this morning is up to 5.2 -Will provide 3 mg of vitamin K and follow trend. -For now we will use SCDs for DVT prophylaxis.  6-depression/anxiety -No suicidal ideation or hallucinations -Continue the use of Celexa and bupropion. -Patient reports feeling a slightly itchy today, most likely due to steroids.  Anticipate improvement once tapering initiated.  7-chronic pain syndrome -Continue to use Vicodin for breakthrough pain. -For now will continue holding fentanyl patch.  8-physical deconditioning -Physical therapy has evaluated patient and recommended skilled nursing facility for rehabilitation and conditioning -TOC aware and assisting with placement.  9-stage II pressure injury -Continue preventive measures and frequent repositioning.  10-moderate protein-calorie malnutrition -Follow dietitian services recommendations for feeding supplements.  DVT prophylaxis: SCDs Code Status: Full code  Family Communication: No family at bedside. Disposition Plan: Transfer to telemetry bed, continue CPAP nightly, continue nebulizer treatment, continue IV steroids (with initiation of tapering), continue  oxygen supplementation, continue flutter valve, Pulmicort and Brovana.  INR has remained elevated.  Hemoglobin stable at 8.8.  Consultants:   Gastroenterology service  Procedures:   See below for x-ray reports.  Antimicrobials:  Anti-infectives (From admission, onward)   None      Subjective: Reports some improvement in her breathing, even is still short of breath with minimal exertion and experiencing intermittent episodes of productive cough.  Patient reports feeling fatigue.  No signs of overt bleeding reported  Objective: Vitals:   05/25/18 0900 05/25/18 1000 05/25/18 1100 05/25/18 1320  BP: (!) 106/48 (!) 125/55 122/62   Pulse: 90 74 77   Resp: (!) 22 17 19    Temp:      TempSrc:      SpO2: 90% 94% 96% 96%  Weight:      Height:        Intake/Output Summary (Last 24 hours) at 05/25/2018 1703 Last data filed at 05/25/2018 1614 Gross per 24 hour  Intake 1171.2 ml  Output 1100 ml  Net 71.2 ml   Filed Weights   05/23/18 0859 05/23/18 1507 05/24/18 0500  Weight: 77.1 kg 68.1 kg 69.4 kg    Examination: General exam: Alert, awake, oriented x 3; feeling slightly better.  Still reporting some shortness of breath with minimal exertion and intermittent episode of productive cough.  No nausea, no vomiting, no signs of overt bleeding reported. Respiratory system: Positive rhonchi right, mild expiratory wheezing on examination, no using accessory muscles.  Wearing 2-3 L nasal cannula supplementation.   Cardiovascular system:RRR. No murmurs, rubs, gallops. Gastrointestinal system: Abdomen is nondistended, soft and nontender. No organomegaly or masses felt. Normal bowel sounds heard. Central nervous system: Alert and oriented. No focal neurological deficits. Extremities: No  cyanosis or clubbing, trace edema bilaterally appreciated. Skin: No rashes, no petechiae.  Stage II buttocks pressure injury (which was present on admission) without any signs of superimposed infection appreciated on exam. Psychiatry: No hallucinations, patient reports feeling a slightly edgy and anxious.  Data Reviewed: I have personally reviewed following labs and imaging studies  CBC: Recent Labs  Lab 05/23/18 1027 05/24/18 0443 05/25/18 0441  WBC 4.7 3.1* 6.5  NEUTROABS 3.3  --   --   HGB 6.6* 8.8* 8.8*  HCT 22.3* 28.3* 28.3*  MCV 97.0 94.6 95.3  PLT 203 207 035   Basic Metabolic Panel: Recent Labs  Lab 05/23/18 1027 05/24/18 0443  NA 143 138  K 3.5 4.2  CL 96* 95*  CO2 39* 36*  GLUCOSE 94 132*  BUN 19 17  CREATININE 0.60 0.63  CALCIUM 7.8* 7.9*   GFR: Estimated Creatinine Clearance: 63 mL/min (by C-G formula based on SCr of 0.63 mg/dL).   Liver Function Tests: Recent Labs  Lab 05/24/18 0443  AST 13*  ALT 9  ALKPHOS 63  BILITOT 0.5  PROT 5.7*  ALBUMIN 2.7*   Coagulation Profile: Recent Labs  Lab 05/23/18 1027 05/24/18 0443 05/24/18 0826 05/25/18 0916  INR 5.0* 4.9* 4.4* 5.2*   Cardiac Enzymes: Recent Labs  Lab 05/23/18 1027  TROPONINI <0.03   Urine analysis:    Component Value Date/Time   COLORURINE YELLOW 05/23/2018 [place] 05/23/2018 0934   LABSPEC 1.014 05/23/2018 [place] 7.0 05/23/2018 [place] 05/23/2018 [place] (A) 05/23/2018 [place] 05/23/2018 0934   BILIRUBINUR n 09/21/2016 [place] 05/23/2018 0934   PROTEINUR 30 (A) 05/23/2018 0093  UROBILINOGEN 0.2 09/21/2016 1432   UROBILINOGEN 0.2 04/13/2009 2025   NITRITE NEGATIVE 05/23/2018 0934   LEUKOCYTESUR NEGATIVE 05/23/2018 0934    Recent Results (from the past 240 hour(s))  Urine culture     Status: Abnormal   Collection Time: 05/23/18  9:35 AM  Result Value Ref Range Status   Specimen  Description   Final    URINE, CLEAN CATCH Performed at [facility], [address], [place], [place] [address]    Special Requests   Final    NONE Performed at [facility], [address] [place], [place] [address]    Culture MULTIPLE SPECIES PRESENT, SUGGEST RECOLLECTION (A)  Final   Report Status 05/24/2018 FINAL  Final  SARS Coronavirus 2 (CEPHEID- Performed in [place] hospital lab), Hosp Order     Status: None   Collection Time: 05/23/18 10:00 AM  Result Value Ref Range Status   SARS Coronavirus 2 NEGATIVE NEGATIVE Final    Comment: (NOTE) If result is NEGATIVE SARS-CoV-2 target nucleic acids are NOT DETECTED. The SARS-CoV-2 RNA is generally detectable in upper and lower  respiratory specimens during the acute phase of infection. The lowest  concentration of SARS-CoV-2 viral copies this assay can detect is 250  copies / mL. A negative result does not preclude SARS-CoV-2 infection  and should not be used as the sole basis for treatment or other  patient management decisions.  A negative result may occur with  improper specimen collection / handling, submission of specimen other  than nasopharyngeal swab, presence of viral mutation(s) within the  areas targeted by this assay, and inadequate number of viral copies  (<250 copies / mL). A negative result must be combined with clinical  observations, patient history, and epidemiological information. If result is POSITIVE SARS-CoV-2 target nucleic acids are DETECTED. The SARS-CoV-2 RNA is generally detectable in upper and lower  respiratory specimens dur ing the acute phase of infection.  Positive  results are indicative of active infection with SARS-CoV-2.  Clinical  correlation with patient history and other diagnostic information is  necessary to determine patient infection status.  Positive results do  not rule out bacterial infection or co-infection with other viruses. If result is PRESUMPTIVE POSTIVE SARS-CoV-2 nucleic  acids MAY BE PRESENT.   A presumptive positive result was obtained on the submitted specimen  and confirmed on repeat testing.  While 2019 novel coronavirus  (SARS-CoV-2) nucleic acids may be present in the submitted sample  additional confirmatory testing may be necessary for epidemiological  and / or clinical management purposes  to differentiate between  SARS-CoV-2 and other Sarbecovirus currently known to infect humans.  If clinically indicated additional testing with an alternate test  methodology [id number]) is advised. The SARS-CoV-2 RNA is generally  detectable in upper and lower respiratory sp ecimens during the acute  phase of infection. The expected result is Negative. Fact Sheet for Patients:  StrictlyIdeas.no Fact Sheet for Healthcare Providers: BankingDealers.co.za This test is not yet approved or cleared by the [place] FDA and has been authorized for detection and/or diagnosis of SARS-CoV-2 by FDA under an Emergency Use Authorization (EUA).  This EUA will remain in effect (meaning this test can be used) for the duration of the COVID-19 declaration under Section 564(b)(1) of the Act, 21 U.S.C. section 360bbb-3(b)(1), unless the authorization is terminated or revoked sooner. Performed at [facility], [address] [place],  [address]   MRSA PCR Screening     Status: None   Collection Time:  05/23/18  3:03 PM  Result Value Ref Range Status   MRSA by PCR NEGATIVE NEGATIVE Final    Comment:        The GeneXpert MRSA Assay (FDA approved for NASAL specimens only), is one component of a comprehensive MRSA colonization surveillance program. It is not intended to diagnose MRSA infection nor to guide or monitor treatment for MRSA infections. Performed at [facility], [address], [place], [place] [address]      Radiology Studies: No results found.  Scheduled Meds: . arformoterol  15 mcg Nebulization BID   . budesonide (PULMICORT) nebulizer solution  0.5 mg Nebulization BID  . buPROPion  100 mg Oral q morning - 10a  . citalopram  10 mg Oral Daily  . dextromethorphan-guaiFENesin  1 tablet Oral BID  . feeding supplement  1 Container Oral BID BM  . feeding supplement (PRO-STAT SUGAR FREE 64)  30 mL Oral BID  . folic acid  1 mg Oral Daily  . ipratropium-albuterol  3 mL Nebulization Q6H  . levothyroxine  125 mcg Oral Q0600  . mouth rinse  15 mL Mouth Rinse BID  . methylPREDNISolone (SOLU-MEDROL) injection  60 mg Intravenous Q12H  . multivitamin with minerals  1 tablet Oral Daily  . nicotine  21 mg Transdermal Daily  . omega-3 acid ethyl esters  1 capsule Oral Daily  . pantoprazole (PROTONIX) IV  40 mg Intravenous Q12H  . polyethylene glycol  17 g Oral Daily   Continuous Infusions: . sodium chloride Stopped (05/23/18 1538)  . sodium chloride 50 mL/hr at 05/25/18 0725     LOS: 2 days    Time spent: 30 minutes    Barton Dubois, MD [facility] Pager [phone]   05/25/2018, 5:03 PM

## 2018-05-25 NOTE — Progress Notes (Signed)
Patient requested to come off CPAP. RN placed back on 2 lpm nasal cannula and notified RT. CPAP still in room on standby.

## 2018-05-25 NOTE — Progress Notes (Signed)
INR is 5.2. It was 4.4 yesterday. Discussed with Dr. Dyann Kief. Patient did receive 3 mg of vitamin K IV. Will hold off on EGD until INR drops to below 2. Ferrous sulfate discontinued.

## 2018-05-26 ENCOUNTER — Encounter (HOSPITAL_COMMUNITY)
Admission: EM | Disposition: A | Discharge status: Home Health | Payer: Self-pay | Source: Home / Self Care | Attending: Internal Medicine

## 2018-05-26 ENCOUNTER — Inpatient Hospital Stay (HOSPITAL_COMMUNITY): Payer: Medicare Other | Admitting: Anesthesiology

## 2018-05-26 ENCOUNTER — Encounter (HOSPITAL_COMMUNITY): Payer: Self-pay | Admitting: *Deleted

## 2018-05-26 DIAGNOSIS — K228 Other specified diseases of esophagus: Secondary | ICD-10-CM

## 2018-05-26 HISTORY — PX: ESOPHAGOGASTRODUODENOSCOPY (EGD) WITH PROPOFOL: SHX5813

## 2018-05-26 LAB — PROTIME-INR
INR: 1.2 (ref 0.8–1.2)
Prothrombin Time: 15.4 seconds — ABNORMAL HIGH (ref 11.4–15.2)

## 2018-05-26 LAB — HEPARIN LEVEL (UNFRACTIONATED): Heparin Unfractionated: 0.85 IU/mL — ABNORMAL HIGH (ref 0.30–0.70)

## 2018-05-26 SURGERY — ESOPHAGOGASTRODUODENOSCOPY (EGD) WITH PROPOFOL
Anesthesia: General

## 2018-05-26 MED ORDER — HEPARIN (PORCINE) 25000 UT/250ML-% IV SOLN
850.0000 [IU]/h | INTRAVENOUS | Status: DC
  Administered 2018-05-26: 1100 [IU]/h via INTRAVENOUS
  Filled 2018-05-26 (×2): qty 250

## 2018-05-26 MED ORDER — BISACODYL 5 MG PO TBEC
10.0000 mg | DELAYED_RELEASE_TABLET | Freq: Once | ORAL | Status: AC
  Administered 2018-05-26: 10 mg via ORAL
  Filled 2018-05-26: qty 2

## 2018-05-26 MED ORDER — HEPARIN BOLUS VIA INFUSION
4000.0000 [IU] | Freq: Once | INTRAVENOUS | Status: AC
  Administered 2018-05-26: 12:00:00 4000 [IU] via INTRAVENOUS
  Filled 2018-05-26: qty 4000

## 2018-05-26 MED ORDER — PROPOFOL 10 MG/ML IV BOLUS
INTRAVENOUS | Status: DC | PRN
  Administered 2018-05-26: 10 mg via INTRAVENOUS
  Administered 2018-05-26: 120 mg via INTRAVENOUS
  Administered 2018-05-26: 40 mg via INTRAVENOUS

## 2018-05-26 MED ORDER — PROPOFOL 10 MG/ML IV BOLUS
INTRAVENOUS | Status: AC
  Filled 2018-05-26: qty 20

## 2018-05-26 MED ORDER — PEG 3350-KCL-NA BICARB-NACL 420 G PO SOLR: 4000.0000 mL | Freq: Once | ORAL | Status: DC

## 2018-05-26 MED ORDER — BISACODYL 5 MG PO TBEC: 10.0000 mg | DELAYED_RELEASE_TABLET | Freq: Once | ORAL | Status: DC

## 2018-05-26 MED ORDER — SODIUM CHLORIDE 0.9 % IV SOLN: INTRAVENOUS | Status: DC

## 2018-05-26 MED ORDER — STERILE WATER FOR IRRIGATION IR SOLN
Status: DC | PRN
  Administered 2018-05-26: 2.5 mL

## 2018-05-26 NOTE — Progress Notes (Signed)
  Brief EGD note.  Normal examination.

## 2018-05-26 NOTE — Transfer of Care (Signed)
Immediate Anesthesia Transfer of Care Note  Patient: [name]  Procedure(s) Performed: ESOPHAGOGASTRODUODENOSCOPY (EGD) WITH PROPOFOL (N/A )  Patient Location: PACU  Anesthesia Type:General  Level of Consciousness: awake  Airway & Oxygen Therapy: Patient Spontanous Breathing  Post-op Assessment: Report given to RN  Post vital signs: Reviewed and stable  Last Vitals:  Vitals Value Taken Time  BP    Temp    Pulse 82 05/26/2018  9:17 AM  Resp    SpO2 85 % 05/26/2018  9:17 AM  Vitals shown include unvalidated device data.  Last Pain:  Vitals:   05/26/18 0847  TempSrc: Oral  PainSc:       Patients Stated Pain Goal: 1 (41/44/36 0165)  Complications: No apparent anesthesia complications

## 2018-05-26 NOTE — Progress Notes (Addendum)
[name] ANTICOAGULATION CONSULT NOTE - Initial Consult  Pharmacy Consult for heparin gtt  Indication: factor V leiden consult from Dr Laural Golden  Allergies  Allergen Reactions  . Dilaudid [Hydromorphone Hcl] Hives and Nausea Only  . Minocycline Hcl     REACTION: Dizzy  . Prednisone     REACTION: feels like throat swelling, hallucinations  . Varenicline Tartrate     REACTION: Dizzy(chantix)   . Zocor [Simvastatin - High Dose] Other (See Comments)    myalgia    Patient Measurements: Height: 5\' 4"  (162.6 cm) Weight: 160 lb 4.4 oz (72.7 kg) IBW/kg (Calculated) : 54.7 Heparin Dosing Weight: HEPARIN DW (KG): 69.7   Vital Signs: Temp: 98 F (36.7 C) (05/17 1431) Temp Source: Oral (05/17 0847) BP: 100/48 (05/17 1431) Pulse Rate: 74 (05/17 1431)  Labs: Recent Labs    05/24/18 0443 05/24/18 0826 05/25/18 0441 05/25/18 0916 05/26/18 0457 05/26/18 1617  HGB 8.8*  --  8.8*  --   --   --   HCT 28.3*  --  28.3*  --   --   --   PLT 207  --  227  --   --   --   LABPROT 44.6* 41.6*  --  46.9* 15.4*  --   INR 4.9* 4.4*  --  5.2* 1.2  --   HEPARINUNFRC  --   --   --   --   --  0.85*  CREATININE 0.63  --   --   --   --   --     Estimated Creatinine Clearance: 65.8 mL/min (by C-G formula based on SCr of 0.63 mg/dL).   Medical History: Past Medical History:  Diagnosis Date  . Acute MI ([employer])    x4, code blue x3  . Arteriosclerotic cardiovascular disease (ASCVD) 2002   Inf STEMI-2002. 2003-cutting balloon + brachytherapy for restenosis; subsequent acute stent thrombosis 06/2010 requiring 2 separate interventions (Zeta stent, then repeat cath with thrombectomy). focal basal inf AK, nl EF; 03/2011: Patent stents, minor nonobst  residual dz, nl EF; neg stress nuclear in 2008 and stress echo in 2009  . Chronic anticoagulation    Warfarin plus ticagrelor  . Chronic respiratory failure ([place]) 08/27/2013   On 2L 02  . COPD (chronic obstructive pulmonary disease) ([place])    02 dependent  . COPD  (chronic obstructive pulmonary disease) ([place])   . Factor 5 Leiden mutation, heterozygous ([place])   . Factor V Leiden, prothrombin gene mutation ([place]) 2006  . Hyperlipidemia   . Hypothyroidism   . Noncompliance   . Pelvic fracture ([place]) 2009  . Pulmonary embolism ([place]) 2006   Associated with deep vein thrombosis-2006; + factor V Leiden  . Tobacco abuse    50 pack years    Medications:  Medications Prior to Admission  Medication Sig Dispense Refill Last Dose  . albuterol (PROVENTIL) (2.5 MG/3ML) 0.083% nebulizer solution USE 1 VIAL BY NEBULIZER EVERY 4 HOURS AS NEEDED FOR WHEEZING. DX: J44.9 (Patient taking differently: Take 2.5 mg by nebulization every 4 (four) hours as needed for wheezing or shortness of breath. ) 375 vial 0 unknown  . arformoterol (BROVANA) 15 MCG/2ML NEBU Take 2 mLs (15 mcg total) by nebulization 2 (two) times daily. 120 mL 6 02/15/2018 at Unknown time  . baclofen (LIORESAL) 10 MG tablet Take 1 tablet (10 mg total) by mouth 3 (three) times daily. 15 tablet 0 05/22/2018 at Unknown time  . budesonide (PULMICORT) 0.5 MG/2ML nebulizer solution Take 2 mLs (0.5 mg  total) by nebulization 2 (two) times daily. Dx: J43.9 120 mL 3 02/15/2018 at Unknown time  . buPROPion (WELLBUTRIN SR) 100 MG 12 hr tablet Take 100 mg by mouth every morning.   05/22/2018 at Unknown time  . citalopram (CELEXA) 10 MG tablet Take 1 tablet by mouth daily.   05/22/2018 at Unknown time  . fentaNYL (DURAGESIC) 50 MCG/HR Place 1 patch onto the skin every 3 (three) days. 3 patch 0 05/22/2018 at Unknown time  . ferrous sulfate 324 (65 Fe) MG TBEC Take 1 tablet (325 mg total) by mouth daily with breakfast. 30 tablet  05/22/2018 at Unknown time  . folic acid (FOLVITE) 1 MG tablet Take 1 tablet (1 mg total) by mouth daily.  3 05/22/2018 at Unknown time  . HYDROcodone-acetaminophen (NORCO/VICODIN) 5-325 MG tablet Take 1 tablet by mouth every 6 (six) hours as needed (BREAKTHROUGH PAIN). 10 tablet 0   . ipratropium-albuterol  (DUONEB) 0.5-2.5 (3) MG/3ML SOLN Take 3 mLs by nebulization 4 (four) times daily. 360 mL 3 05/23/2018 at Unknown time  . levothyroxine (SYNTHROID, LEVOTHROID) 125 MCG tablet Take 1 tablet (125 mcg total) by mouth daily. 90 tablet 3 05/22/2018 at Unknown time  . nitroGLYCERIN (NITROSTAT) 0.4 MG SL tablet Place 1 tablet (0.4 mg total) under the tongue every 5 (five) minutes as needed for chest pain. 25 tablet 3 unknown  . Omega-3 Fatty Acids (FISH OIL) 600 MG CAPS Take 1 capsule by mouth daily.    05/22/2018 at Unknown time  . Respiratory Therapy Supplies (FLUTTER) DEVI Use as directed 1 each 0 unknown  . ticagrelor (BRILINTA) 60 MG TABS tablet Take 1 tablet (60 mg total) by mouth 2 (two) times daily.   05/22/2018 at 2100  . traZODone (DESYREL) 50 MG tablet Take 50 mg by mouth at bedtime as needed for sleep.   05/22/2018 at Unknown time  . warfarin (COUMADIN) 1 MG tablet Take 1 mg by mouth See admin instructions. Alternates taking 6mg  with 5mg  daily. Takes 5mg  on Mondays, Wednesdays, and Fridays. Takes 6mg  on all other days   05/22/2018 at 900  . warfarin (COUMADIN) 5 MG tablet TAKE 1 TABLET BY MOUTH ONCE DAILY 90 tablet 3 Past Week at Unknown time  . lidocaine (LIDODERM) 5 % Place 1 patch onto the skin daily. Remove & Discard patch within 12 hours or as directed by MD (Patient not taking: Reported on 05/23/2018) 30 patch 0 Not Taking at Unknown time  . pantoprazole (PROTONIX) 40 MG tablet Take 1 tablet (40 mg total) by mouth daily.     . polyethylene glycol (MIRALAX / GLYCOLAX) packet Take 17 g by mouth daily. (Patient not taking: Reported on 02/15/2018) 14 each 0 Not Taking at Unknown time   Scheduled:  . arformoterol  15 mcg Nebulization BID  . budesonide (PULMICORT) nebulizer solution  0.5 mg Nebulization BID  . buPROPion  100 mg Oral q morning - 10a  . citalopram  10 mg Oral Daily  . dextromethorphan-guaiFENesin  1 tablet Oral BID  . feeding supplement  1 Container Oral BID BM  . feeding supplement  (PRO-STAT SUGAR FREE 64)  30 mL Oral BID  . folic acid  1 mg Oral Daily  . ipratropium-albuterol  3 mL Nebulization Q6H  . levothyroxine  125 mcg Oral Q0600  . mouth rinse  15 mL Mouth Rinse BID  . methylPREDNISolone (SOLU-MEDROL) injection  40 mg Intravenous Q12H  . multivitamin with minerals  1 tablet Oral Daily  . nicotine  21  mg Transdermal Daily  . omega-3 acid ethyl esters  1 capsule Oral Daily  . pantoprazole (PROTONIX) IV  40 mg Intravenous Q12H  . polyethylene glycol  17 g Oral Daily   Infusions:  . sodium chloride Stopped (05/23/18 1538)  . sodium chloride 50 mL/hr at 05/25/18 2015  . sodium chloride 20 mL/hr at 05/26/18 1145  . heparin 1,100 Units/hr (05/26/18 1215)   PRN: bisacodyl, fentaNYL (SUBLIMAZE) injection, HYDROcodone-acetaminophen, traZODone Anti-infectives (From admission, onward)   None      Assessment: [name] a 68 y.o. female requires anticoagulation with a heparin iv infusion for the indication of  factor 5 leiden disease. Heparin gtt will be started following pharmacy protocol per pharmacy consult. Patient is not on previous oral anticoagulant that will require aPTT/HL correlation before transitioning to only HL monitoring.   HL 0.85, supratherapeutic  Goal of Therapy:  Heparin level 0.3-0.7 units/ml Monitor platelets by anticoagulation protocol: Yes   Plan:   STOP heparin 5/18@0700  per Dr Laural Golden for procedure 5/18 Reduce heparin infusion to 850 units/hr Check anti-Xa level in 6 hours Continue to monitor H&H and platelets  Heparin level to be drawn in 6 hours  Donna Christen Avery Eustice 05/26/2018,4:59 PM

## 2018-05-26 NOTE — Op Note (Signed)
[facility] Patient Name: [name] Procedure Date: 05/26/2018 8:56 AM MRN: [id number] Date of Birth: January 22, 1950 Attending MD: Hildred Laser , MD CSN: [id number] Age: 68 Admit Type: Inpatient Procedure:                Upper GI endoscopy Indications:              Melena and anemia Providers:                Hildred Laser, MD, Janeece Riggers, RN, Lurline Del, RN Referring MD:             Olin Hauser, MD Medicines:                Propofol per Anesthesia Complications:            No immediate complications. Estimated Blood Loss:     Estimated blood loss: none. Procedure:                Pre-Anesthesia Assessment:                           - Prior to the procedure, a History and Physical                            was performed, and patient medications and                            allergies were reviewed. The patient's tolerance of                            previous anesthesia was also reviewed. The risks                            and benefits of the procedure and the sedation                            options and risks were discussed with the patient.                            All questions were answered, and informed consent                            was obtained. Prior Anticoagulants: The patient                            last took Coumadin (warfarin) 4 days prior to the                            procedure. ASA Grade Assessment: III - A patient                            with severe systemic disease. After reviewing the                            risks and benefits, the patient was deemed in  satisfactory condition to undergo the procedure.                           After obtaining informed consent, the endoscope was                            passed under direct vision. Throughout the                            procedure, the patient's blood pressure, pulse, and                            oxygen saturations were monitored continuously. The                             [id number]-H190 ([id number]) scope was introduced through the                            and advanced to the second part of duodenum. The                            upper GI endoscopy was accomplished without                            difficulty. The patient tolerated the procedure                            well. Scope In: 9:05:58 AM Scope Out: 9:10:31 AM Total Procedure Duration: 0 hours 4 minutes 33 seconds  Findings:      The examined esophagus was normal.      The Z-line was irregular and was found 40 cm from the incisors.      The entire examined stomach was normal.      The duodenal bulb and second portion of the duodenum were normal. Impression:               - Normal esophagus.                           - Z-line irregular, 40 cm from the incisors.                           - Normal stomach.                           - Normal duodenal bulb and second portion of the                            duodenum.                           - No specimens collected. Moderate Sedation:      Per Anesthesia Care Recommendation:           - Return patient to hospital ward for ongoing care.                           -  Clear liquid diet today.                           - Continue present medications.                           - Perform a colonoscopy tomorrow.                           - Begin anticoagulation with heparin to be stopped                            6 hours prior to procedure. Procedure Code(s):        --- Professional ---                           [phone], Esophagogastroduodenoscopy, flexible,                            transoral; diagnostic, including collection of                            specimen(s) by brushing or washing, when performed                            (separate procedure) Diagnosis Code(s):        --- Professional ---                           K22.8, Other specified diseases of esophagus                           K92.1, Melena (includes  Hematochezia) CPT copyright 2019 [employer]. All rights reserved. The codes documented in this report are preliminary and upon coder review may  be revised to meet current compliance requirements. Hildred Laser, MD Hildred Laser, MD 05/26/2018 9:22:40 AM This report has been signed electronically. Number of Addenda: 0

## 2018-05-26 NOTE — Progress Notes (Signed)
Pt to Endo 

## 2018-05-26 NOTE — Progress Notes (Signed)
[name] ANTICOAGULATION CONSULT NOTE - Initial Consult  Pharmacy Consult for heparin gtt  Indication: factor V leiden consult from Dr Laural Golden  Allergies  Allergen Reactions  . Dilaudid [Hydromorphone Hcl] Hives and Nausea Only  . Minocycline Hcl     REACTION: Dizzy  . Prednisone     REACTION: feels like throat swelling, hallucinations  . Varenicline Tartrate     REACTION: Dizzy(chantix)   . Zocor [Simvastatin - High Dose] Other (See Comments)    myalgia    Patient Measurements: Height: 5\' 4"  (162.6 cm) Weight: 160 lb 4.4 oz (72.7 kg) IBW/kg (Calculated) : 54.7 Heparin Dosing Weight: HEPARIN DW (KG): 69.7   Vital Signs: Temp: 98.2 F (36.8 C) (05/17 0919) Temp Source: Oral (05/17 0847) BP: 114/57 (05/17 0919) Pulse Rate: 89 (05/17 0919)  Labs: Recent Labs    05/23/18 1027 05/24/18 0443 05/24/18 0826 05/25/18 0441 05/25/18 0916 05/26/18 0457  HGB 6.6* 8.8*  --  8.8*  --   --   HCT 22.3* 28.3*  --  28.3*  --   --   PLT 203 207  --  227  --   --   LABPROT 45.6* 44.6* 41.6*  --  46.9* 15.4*  INR 5.0* 4.9* 4.4*  --  5.2* 1.2  CREATININE 0.60 0.63  --   --   --   --   TROPONINI <0.03  --   --   --   --   --     Estimated Creatinine Clearance: 65.8 mL/min (by C-G formula based on SCr of 0.63 mg/dL).   Medical History: Past Medical History:  Diagnosis Date  . Acute MI ([place])    x4, code blue x3  . Arteriosclerotic cardiovascular disease (ASCVD) 2002   Inf STEMI-2002. 2003-cutting balloon + brachytherapy for restenosis; subsequent acute stent thrombosis 06/2010 requiring 2 separate interventions (Zeta stent, then repeat cath with thrombectomy). focal basal inf AK, nl EF; 03/2011: Patent stents, minor nonobst  residual dz, nl EF; neg stress nuclear in 2008 and stress echo in 2009  . Chronic anticoagulation    Warfarin plus ticagrelor  . Chronic respiratory failure ([place]) 08/27/2013   On 2L 02  . 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 COPD  (chronic obstructive pulmonary disease) ([place])   . Factor 5 Leiden mutation, heterozygous ([place])   . Factor V Leiden, prothrombin gene mutation ([place]) 2006  . Hyperlipidemia   . Hypothyroidism   . Noncompliance   . Pelvic fracture ([place]) 2009  . Pulmonary embolism ([place]) 2006   Associated with deep vein thrombosis-2006; + factor V Leiden  . Tobacco abuse    50 pack years    Medications:  Medications Prior to Admission  Medication Sig Dispense Refill Last Dose  . albuterol (PROVENTIL) (2.5 MG/3ML) 0.083% nebulizer solution USE 1 VIAL BY NEBULIZER EVERY 4 HOURS AS NEEDED FOR WHEEZING. DX: J44.9 (Patient taking differently: Take 2.5 mg by nebulization every 4 (four) hours as needed for wheezing or shortness of breath. ) 375 vial 0 unknown  . arformoterol (BROVANA) 15 MCG/2ML NEBU Take 2 mLs (15 mcg total) by nebulization 2 (two) times daily. 120 mL 6 02/15/2018 at Unknown time  . baclofen (LIORESAL) 10 MG tablet Take 1 tablet (10 mg total) by mouth 3 (three) times daily. 15 tablet 0 05/22/2018 at Unknown time  . budesonide (PULMICORT) 0.5 MG/2ML nebulizer solution Take 2 mLs (0.5 mg total) by nebulization 2 (two) times daily. Dx: J43.9 120 mL 3  02/15/2018 at Unknown time  . buPROPion (WELLBUTRIN SR) 100 MG 12 hr tablet Take 100 mg by mouth every morning.   05/22/2018 at Unknown time  . citalopram (CELEXA) 10 MG tablet Take 1 tablet by mouth daily.   05/22/2018 at Unknown time  . fentaNYL (DURAGESIC) 50 MCG/HR Place 1 patch onto the skin every 3 (three) days. 3 patch 0 05/22/2018 at Unknown time  . ferrous sulfate 324 (65 Fe) MG TBEC Take 1 tablet (325 mg total) by mouth daily with breakfast. 30 tablet  05/22/2018 at Unknown time  . folic acid (FOLVITE) 1 MG tablet Take 1 tablet (1 mg total) by mouth daily.  3 05/22/2018 at Unknown time  . HYDROcodone-acetaminophen (NORCO/VICODIN) 5-325 MG tablet Take 1 tablet by mouth every 6 (six) hours as needed (BREAKTHROUGH PAIN). 10 tablet 0   . ipratropium-albuterol  (DUONEB) 0.5-2.5 (3) MG/3ML SOLN Take 3 mLs by nebulization 4 (four) times daily. 360 mL 3 05/23/2018 at Unknown time  . levothyroxine (SYNTHROID, LEVOTHROID) 125 MCG tablet Take 1 tablet (125 mcg total) by mouth daily. 90 tablet 3 05/22/2018 at Unknown time  . nitroGLYCERIN (NITROSTAT) 0.4 MG SL tablet Place 1 tablet (0.4 mg total) under the tongue every 5 (five) minutes as needed for chest pain. 25 tablet 3 unknown  . Omega-3 Fatty Acids (FISH OIL) 600 MG CAPS Take 1 capsule by mouth daily.    05/22/2018 at Unknown time  . Respiratory Therapy Supplies (FLUTTER) DEVI Use as directed 1 each 0 unknown  . ticagrelor (BRILINTA) 60 MG TABS tablet Take 1 tablet (60 mg total) by mouth 2 (two) times daily.   05/22/2018 at 2100  . traZODone (DESYREL) 50 MG tablet Take 50 mg by mouth at bedtime as needed for sleep.   05/22/2018 at Unknown time  . warfarin (COUMADIN) 1 MG tablet Take 1 mg by mouth See admin instructions. Alternates taking 6mg  with 5mg  daily. Takes 5mg  on Mondays, Wednesdays, and Fridays. Takes 6mg  on all other days   05/22/2018 at 900  . warfarin (COUMADIN) 5 MG tablet TAKE 1 TABLET BY MOUTH ONCE DAILY 90 tablet 3 Past Week at Unknown time  . lidocaine (LIDODERM) 5 % Place 1 patch onto the skin daily. Remove & Discard patch within 12 hours or as directed by MD (Patient not taking: Reported on 05/23/2018) 30 patch 0 Not Taking at Unknown time  . pantoprazole (PROTONIX) 40 MG tablet Take 1 tablet (40 mg total) by mouth daily.     . polyethylene glycol (MIRALAX / GLYCOLAX) packet Take 17 g by mouth daily. (Patient not taking: Reported on 02/15/2018) 14 each 0 Not Taking at Unknown time   Scheduled:  . [MAR Hold] arformoterol  15 mcg Nebulization BID  . [MAR Hold] budesonide (PULMICORT) nebulizer solution  0.5 mg Nebulization BID  . [MAR Hold] buPROPion  100 mg Oral q morning - 10a  . [MAR Hold] citalopram  10 mg Oral Daily  . [MAR Hold] dextromethorphan-guaiFENesin  1 tablet Oral BID  . [MAR Hold]  feeding supplement  1 Container Oral BID BM  . [MAR Hold] feeding supplement (PRO-STAT SUGAR FREE 64)  30 mL Oral BID  . [MAR Hold] folic acid  1 mg Oral Daily  . heparin  4,000 Units Intravenous Once  . [MAR Hold] ipratropium-albuterol  3 mL Nebulization Q6H  . [MAR Hold] levothyroxine  125 mcg Oral Q0600  . [MAR Hold] mouth rinse  15 mL Mouth Rinse BID  . [MAR Hold] methylPREDNISolone (SOLU-MEDROL) injection  40 mg Intravenous Q12H  . [MAR Hold] multivitamin with minerals  1 tablet Oral Daily  . [MAR Hold] nicotine  21 mg Transdermal Daily  . [MAR Hold] omega-3 acid ethyl esters  1 capsule Oral Daily  . [MAR Hold] pantoprazole (PROTONIX) IV  40 mg Intravenous Q12H  . [MAR Hold] polyethylene glycol  17 g Oral Daily   Infusions:  . [MAR Hold] sodium chloride Stopped (05/23/18 1538)  . sodium chloride 50 mL/hr at 05/25/18 2015  . heparin     PRN: [MAR Hold] bisacodyl, [MAR Hold] fentaNYL (SUBLIMAZE) injection, [MAR Hold] HYDROcodone-acetaminophen, [MAR Hold] traZODone Anti-infectives (From admission, onward)   None      Assessment: [name] a 68 y.o. female requires anticoagulation with a heparin iv infusion for the indication of  factor 5 leiden disease. Heparin gtt will be started following pharmacy protocol per pharmacy consult. Patient is not on previous oral anticoagulant that will require aPTT/HL correlation before transitioning to only HL monitoring.   Goal of Therapy:  Heparin level 0.3-0.7 units/ml Monitor platelets by anticoagulation protocol: Yes   Plan: Dr Laural Golden requests bolus dosing  Give 4000 units bolus x 1 Start heparin infusion at 1100 units/hr Check anti-Xa level in 6 hours and daily while on heparin Continue to monitor H&H and platelets  Heparin level to be drawn in 6 hours  Crawford Tamura 05/26/2018,9:31 AM

## 2018-05-26 NOTE — Anesthesia Postprocedure Evaluation (Signed)
Anesthesia Post Note  Patient: [name]  Procedure(s) Performed: ESOPHAGOGASTRODUODENOSCOPY (EGD) WITH PROPOFOL (N/A )  Patient location during evaluation: PACU Anesthesia Type: General Level of consciousness: awake Pain management: pain level controlled Vital Signs Assessment: post-procedure vital signs reviewed and stable Respiratory status: spontaneous breathing Cardiovascular status: blood pressure returned to baseline Anesthetic complications: no     Last Vitals:  Vitals:   05/26/18 0751 05/26/18 0847  BP:  133/65  Pulse:  83  Resp:    Temp:  36.4 C  SpO2: 96% 99%    Last Pain:  Vitals:   05/26/18 0847  TempSrc: Oral  PainSc:                  Louann Sjogren

## 2018-05-26 NOTE — Progress Notes (Signed)
PROGRESS NOTE    [name]  EXB:[id number] DOB: [birth year] DOA: 05/23/2018 PCP: [name], NP     Brief Narrative:  68 y.o. 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  Patient reports symptom has been present for the last 3 to 4 days and worsening.  She has used home nebulizer without significant improvement.  Patient continues to smoke.  Patient reported intermittent episode of productive cough and also expressed positive/worsening wheezing.  Patient reports intermittent episode of melanotic stools and ongoing sensation of feeling weak and tired at all times.  Patient denies fever, chills, nausea and vomiting; there has not been abdominal pain, no headaches, no chest pain, no diaphoresis, no focal weakness, no dysuria or hematuria. In the ED chest x-ray demonstrated chronic bronchitic/emphysematous changes without acute infiltrates. COVID-19 test neg. No acute ischemic changes on EKG, neg troponin and normal BNP. Hgb down to 6.6 and positive FOBT. ABG with compensated hypercapnia.  She received fluid resuscitation, type and cross with order to initiate transfusion, nebulizer treatment and started on BiPAP to assist with her shortness of breath and hypercapnia.  TRH has been called to admit patient for further evaluation and management of COPD exacerbation and GI bleed.   Assessment & Plan: 1-Acute on chronic respiratory failure in the setting of COPD exacerbation with component of hypercapnia and hypoxia; patient also with low hemoglobin most likely playing a role in her increased shortness of breath. -Continue IV steroids (with ongoing tapering), nebulizer  management, Pulmicort, Brovana, flutter valve and Mucinex. -No antibiotics will be given as there was no infiltrates appreciated on x-ray. -Patient remains afebrile and with normal WBCs. -Continue nightly CPAP  -Follow clinical response.  2-acute blood loss anemia/melena -GI has been consulted and will follow recommendations -Diet advanced as per GI service recommendations. -Continue PPI -After 2 units of PRBCs hemoglobin has remained stable at 8.8 (was 6.6 on admission). -Continue to follow hemoglobin trend intermittently -EGD non revealing of source of bleeding; GI planning for colonoscopy on 5/18  3-hypothyroidism -Continue Synthroid  4-tobacco abuse -Cessation counseling has been once again encouraged -Patient has declined the use of nicotine patch.  5-factor V deficiency: With past history of DVT/PE. -Chronically on Coumadin -On presentation INR supratherapeutic, down to 1.2 today -per GI safe to use heparin for bridging.  6-depression/anxiety -No suicidal ideation or hallucinations -Continue the use of Celexa and bupropion. -Patient reports feeling a slightly itchy today, most likely due to steroids.  Anticipate improvement once tapering initiated.  7-chronic pain syndrome -Continue to use Vicodin for breakthrough pain. -For now will continue holding fentanyl patch.  8-physical deconditioning -Physical therapy has evaluated patient and recommended skilled nursing facility for rehabilitation and conditioning -TOC aware and assisting with placement.  9-stage II pressure injury -Continue preventive measures and frequent repositioning.  10-moderate protein-calorie malnutrition -Follow dietitian services recommendations for feeding supplements.  DVT prophylaxis: SCDs Code Status: Full code Family Communication: No family at bedside. Disposition Plan: Remains inpatient, continue CPAP nightly, continue nebulizer treatment, continue IV steroids (with ongoing tapering),  continue oxygen supplementation, continue flutter valve, Pulmicort and Brovana.  INR 1.2 today, heparin drip for bridging started .  Last Hemoglobin stable at 8.8.  Consultants:   Gastroenterology service  Procedures:   See below for x-ray  reports.  Antimicrobials:  Anti-infectives (From admission, onward)   None      Subjective: Continue demonstrating improvement in her breathing.  No chest pain, no nausea, no vomiting, no fever.  Remains weak and deconditioned.  Objective: Vitals:   05/26/18 0935 05/26/18 0940 05/26/18 1322 05/26/18 1431  BP: (!) 111/53   (!) 100/48  Pulse: 82   74  Resp: 17   18  Temp:    98 F (36.7 C)  TempSrc:      SpO2: 91% 91% 98% 91%  Weight:      Height:        Intake/Output Summary (Last 24 hours) at 05/26/2018 1646 Last data filed at 05/26/2018 0900 Gross per 24 hour  Intake 739.41 ml  Output 1350 ml  Net -610.59 ml   Filed Weights   05/23/18 1507 05/24/18 0500 05/25/18 2035  Weight: 68.1 kg 69.4 kg 72.7 kg    Examination: General exam: Alert, awake, oriented x 3; overall feeling better.  Still reports some shortness of breath on exertion and having some intermittent episode of productive cough.  No nausea, no vomiting, no signs of overt bleeding.  Patient tolerated it well endoscopy procedure. Respiratory system: Mild expiratory wheezing, positive rhonchi, no using accessory muscles.  Speaking in full sentences.  Patient is wearing 2-3 L nasal cannula supplementation.   Cardiovascular system:RRR. No murmurs, rubs, gallops. Gastrointestinal system: Abdomen is nondistended, soft and nontender. No organomegaly or masses felt. Normal bowel sounds heard. Central nervous system: Alert and oriented. No focal neurological deficits. Extremities: No cyanosis or clubbing.  Trace edema appreciated bilaterally. Skin: No rashes, no petechiae.  Stage II buttocks pressure injury without signs of superinfection appreciated on exam. Psychiatry: Judgement  and insight appear normal. Mood & affect appropriate.   Data Reviewed: I have personally reviewed following labs and imaging studies  CBC: Recent Labs  Lab 05/23/18 1027 05/24/18 0443 05/25/18 0441  WBC 4.7 3.1* 6.5  NEUTROABS 3.3  --   --   HGB 6.6* 8.8* 8.8*  HCT 22.3* 28.3* 28.3*  MCV 97.0 94.6 95.3  PLT 203 207 811   Basic Metabolic Panel: Recent Labs  Lab 05/23/18 1027 05/24/18 0443  NA 143 138  K 3.5 4.2  CL 96* 95*  CO2 39* 36*  GLUCOSE 94 132*  BUN 19 17  CREATININE 0.60 0.63  CALCIUM 7.8* 7.9*   GFR: Estimated Creatinine Clearance: 65.8 mL/min (by C-G formula based on SCr of 0.63 mg/dL).   Liver Function Tests: Recent Labs  Lab 05/24/18 0443  AST 13*  ALT 9  ALKPHOS 63  BILITOT 0.5  PROT 5.7*  ALBUMIN 2.7*   Coagulation Profile: Recent Labs  Lab 05/23/18 1027 05/24/18 0443 05/24/18 0826 05/25/18 0916 05/26/18 0457  INR 5.0* 4.9* 4.4* 5.2* 1.2   Cardiac Enzymes: Recent Labs  Lab 05/23/18 1027  TROPONINI <0.03   Urine analysis:    Component Value Date/Time   COLORURINE YELLOW 05/23/2018 0934   APPEARANCEUR CLEAR 05/23/2018 0934   LABSPEC 1.014 05/23/2018 0934   PHURINE 7.0 05/23/2018 0934   GLUCOSEU NEGATIVE 05/23/2018 0934   HGBUR SMALL (A) 05/23/2018 0934   BILIRUBINUR NEGATIVE 05/23/2018 0934   BILIRUBINUR n 09/21/2016 1432   KETONESUR NEGATIVE 05/23/2018 0934   PROTEINUR 30 (A) 05/23/2018 0934   UROBILINOGEN 0.2 09/21/2016 1432   UROBILINOGEN 0.2 04/13/2009 2025   NITRITE NEGATIVE 05/23/2018 0934   LEUKOCYTESUR NEGATIVE 05/23/2018 0934    Recent Results (from the past 240 hour(s))  Urine  culture     Status: Abnormal   Collection Time: 05/23/18  9:35 AM  Result Value Ref Range Status   Specimen Description   Final    URINE, CLEAN CATCH Performed at [facility], [address], [place], [place] [address]    Special Requests   Final    NONE Performed at [facility], [address] [place], [place] [address]     Culture MULTIPLE SPECIES PRESENT, SUGGEST RECOLLECTION (A)  Final   Report Status 05/24/2018 FINAL  Final  SARS Coronavirus 2 (CEPHEID- Performed in [place] hospital lab), Hosp Order     Status: None   Collection Time: 05/23/18 10:00 AM  Result Value Ref Range Status   SARS Coronavirus 2 NEGATIVE NEGATIVE Final    Comment: (NOTE) If result is NEGATIVE SARS-CoV-2 target nucleic acids are NOT DETECTED. The SARS-CoV-2 RNA is generally detectable in upper and lower  respiratory specimens during the acute phase of infection. The lowest  concentration of SARS-CoV-2 viral copies this assay can detect is 250  copies / mL. A negative result does not preclude SARS-CoV-2 infection  and should not be used as the sole basis for treatment or other  patient management decisions.  A negative result may occur with  improper specimen collection / handling, submission of specimen other  than nasopharyngeal swab, presence of viral mutation(s) within the  areas targeted by this assay, and inadequate number of viral copies  (<250 copies / mL). A negative result must be combined with clinical  observations, patient history, and epidemiological information. If result is POSITIVE SARS-CoV-2 target nucleic acids are DETECTED. The SARS-CoV-2 RNA is generally detectable in upper and lower  respiratory specimens dur ing the acute phase of infection.  Positive  results are indicative of active infection with SARS-CoV-2.  Clinical  correlation with patient history and other diagnostic information is  necessary to determine patient infection status.  Positive results do  not rule out bacterial infection or co-infection with other viruses. If result is PRESUMPTIVE POSTIVE SARS-CoV-2 nucleic acids MAY BE PRESENT.   A presumptive positive result was obtained on the submitted specimen  and confirmed on repeat testing.  While 2019 novel coronavirus  (SARS-CoV-2) nucleic acids may be present in the submitted sample   additional confirmatory testing may be necessary for epidemiological  and / or clinical management purposes  to differentiate between  SARS-CoV-2 and other Sarbecovirus currently known to infect humans.  If clinically indicated additional testing with an alternate test  methodology [phone]) is advised. The SARS-CoV-2 RNA is generally  detectable in upper and lower respiratory sp ecimens during the acute  phase of infection. The expected result is Negative. Fact Sheet for Patients:  StrictlyIdeas.no Fact Sheet for Healthcare Providers: BankingDealers.co.za This test is not yet approved or cleared by the [place] FDA and has been authorized for detection and/or diagnosis of SARS-CoV-2 by FDA under an Emergency Use Authorization (EUA).  This EUA will remain in effect (meaning this test can be used) for the duration of the COVID-19 declaration under Section 564(b)(1) of the Act, 21 U.S.C. section 360bbb-3(b)(1), unless the authorization is terminated or revoked sooner. Performed at [facility], [address] [place], [place] [address]   MRSA PCR Screening     Status: None   Collection Time: 05/23/18  3:03 PM  Result Value Ref Range Status   MRSA by PCR NEGATIVE NEGATIVE Final    Comment:        The GeneXpert MRSA Assay (FDA  approved for NASAL specimens only), is one component of a comprehensive MRSA colonization surveillance program. It is not intended to diagnose MRSA infection nor to guide or monitor treatment for MRSA infections. Performed at [facility], [address] [place], [place] [address]      Radiology Studies: No results found.  Scheduled Meds: . arformoterol  15 mcg Nebulization BID  . budesonide (PULMICORT) nebulizer solution  0.5 mg Nebulization BID  . buPROPion  100 mg Oral q morning - 10a  . citalopram  10 mg Oral Daily  . dextromethorphan-guaiFENesin  1 tablet Oral BID  . feeding supplement  1  Container Oral BID BM  . feeding supplement (PRO-STAT SUGAR FREE 64)  30 mL Oral BID  . folic acid  1 mg Oral Daily  . ipratropium-albuterol  3 mL Nebulization Q6H  . levothyroxine  125 mcg Oral Q0600  . mouth rinse  15 mL Mouth Rinse BID  . methylPREDNISolone (SOLU-MEDROL) injection  40 mg Intravenous Q12H  . multivitamin with minerals  1 tablet Oral Daily  . nicotine  21 mg Transdermal Daily  . omega-3 acid ethyl esters  1 capsule Oral Daily  . pantoprazole (PROTONIX) IV  40 mg Intravenous Q12H  . polyethylene glycol  17 g Oral Daily   Continuous Infusions: . sodium chloride Stopped (05/23/18 1538)  . sodium chloride 50 mL/hr at 05/25/18 2015  . sodium chloride 20 mL/hr at 05/26/18 1145  . heparin 1,100 Units/hr (05/26/18 1215)     LOS: 3 days    Time spent: 30 minutes    Barton Dubois, MD [facility] Pager [phone]   05/26/2018, 4:46 PM

## 2018-05-26 NOTE — Anesthesia Preprocedure Evaluation (Signed)
Anesthesia Evaluation  Patient identified by MRN, date of birth, ID band Patient awake    Reviewed: Allergy & Precautions, H&P , NPO status , Patient's Chart, lab work & pertinent test results, reviewed documented beta blocker date and time   Airway Mallampati: II  TM Distance: >3 FB Neck ROM: full    Dental  (+) Upper Dentures, Lower Dentures   Pulmonary Current Smoker,    breath sounds clear to auscultation       Cardiovascular + CAD and + Past MI   Rhythm:regular     Neuro/Psych Depression    GI/Hepatic GERD  Medicated,  Endo/Other    Renal/GU      Musculoskeletal   Abdominal   Peds  Hematology   Anesthesia Other Findings   Reproductive/Obstetrics                             Anesthesia Physical Anesthesia Plan  ASA: III and emergent  Anesthesia Plan: General   Post-op Pain Management:    Induction:   PONV Risk Score and Plan: TIVA  Airway Management Planned:   Additional Equipment:   Intra-op Plan:   Post-operative Plan:   Informed Consent: I have reviewed the patients History and Physical, chart, labs and discussed the procedure including the risks, benefits and alternatives for the proposed anesthesia with the patient or authorized representative who has indicated his/her understanding and acceptance.       Plan Discussed with: Anesthesiologist and Surgeon  Anesthesia Plan Comments:         Anesthesia Quick Evaluation

## 2018-05-26 NOTE — Progress Notes (Signed)
  Subjective:  Patient has no complaints.  She denies nausea vomiting hematemesis or melena.  She has not had a bowel movement since she was last seen.  She just wants to be knocked out for the procedure.  Objective: Blood pressure (!) 115/53, pulse 82, temperature 98.7 F (37.1 C), temperature source Oral, resp. rate 20, height 5' 4" (1.626 m), weight 72.7 kg, SpO2 96 %. Patient is alert and in no acute distress. Neck exam with regular rhythm normal S1 and S2.  No murmur or gallop noted. Auscultation of lungs reveal diminished entry of breath sounds bilaterally. Abdomen is soft and nontender with organomegaly or masses. She has trace edema around left ankle.  Labs/studies Results:  CBC Latest Ref Rng & Units 05/25/2018 05/24/2018 05/23/2018  WBC 4.0 - 10.5 K/uL 6.5 3.1(L) 4.7  Hemoglobin 12.0 - 15.0 g/dL 8.8(L) 8.8(L) 6.6(LL)  Hematocrit 36.0 - 46.0 % 28.3(L) 28.3(L) 22.3(L)  Platelets 150 - 400 K/uL 227 207 203    CMP Latest Ref Rng & Units 05/24/2018 05/23/2018 02/18/2018  Glucose 70 - 99 mg/dL 132(H) 94 146(H)  BUN 8 - 23 mg/dL 17 19 24(H)  Creatinine 0.44 - 1.00 mg/dL 0.63 0.60 0.78  Sodium 135 - 145 mmol/L 138 143 138  Potassium 3.5 - 5.1 mmol/L 4.2 3.5 4.5  Chloride 98 - 111 mmol/L 95(L) 96(L) 98  CO2 22 - 32 mmol/L 36(H) 39(H) 32  Calcium 8.9 - 10.3 mg/dL 7.9(L) 7.8(L) 8.1(L)  Total Protein 6.5 - 8.1 g/dL 5.7(L) - -  Total Bilirubin 0.3 - 1.2 mg/dL 0.5 - -  Alkaline Phos 38 - 126 U/L 63 - -  AST 15 - 41 U/L 13(L) - -  ALT 0 - 44 U/L 9 - -    Hepatic Function Latest Ref Rng & Units 05/24/2018 01/31/2018 01/28/2018  Total Protein 6.5 - 8.1 g/dL 5.7(L) - 6.8  Albumin 3.5 - 5.0 g/dL 2.7(L) 2.3(L) 2.8(L)  AST 15 - 41 U/L 13(L) - 31  ALT 0 - 44 U/L 9 - 37  Alk Phosphatase 38 - 126 U/L 63 - 94  Total Bilirubin 0.3 - 1.2 mg/dL 0.5 - 0.3  Bilirubin, Direct 0.0 - 0.3 mg/dL - - -     Assessment:  #1.  Melena.  Patient has received 2 units of PRBCs.  No evidence of active  bleeding.  #2.  Anticoagulation status.  Patient has been on warfarin for factor V Leiden deficiency.  INR is down to 1.2.  He received p.o. vitamin K 2 days ago and 3 mg IV yesterday.  We will need to get back on heparin as soon as possible and warfarin when appropriate  #3.  COPD.  She appears to be at her baseline.  Plan:  Proceed with diagnostic esophagogastroduodenoscopy under monitored anesthesia care. Patient is agreeable.

## 2018-05-27 ENCOUNTER — Encounter (HOSPITAL_COMMUNITY)
Admission: EM | Disposition: A | Discharge status: Home Health | Payer: Self-pay | Source: Home / Self Care | Attending: Internal Medicine

## 2018-05-27 ENCOUNTER — Encounter (HOSPITAL_COMMUNITY): Payer: Self-pay | Admitting: Anesthesiology

## 2018-05-27 ENCOUNTER — Encounter (HOSPITAL_COMMUNITY): Payer: Self-pay | Admitting: Internal Medicine

## 2018-05-27 LAB — BASIC METABOLIC PANEL
Anion gap: 6 (ref 5–15)
BUN: 17 mg/dL (ref 8–23)
CO2: 31 mmol/L (ref 22–32)
Calcium: 7.8 mg/dL — ABNORMAL LOW (ref 8.9–10.3)
Chloride: 103 mmol/L (ref 98–111)
Creatinine, Ser: 0.7 mg/dL (ref 0.44–1.00)
GFR calc Af Amer: >60 mL/min (ref >60)
GFR calc non Af Amer: >60 mL/min (ref >60)
Glucose, Bld: 123 mg/dL — ABNORMAL HIGH (ref 70–99)
Potassium: 3.5 mmol/L (ref 3.5–5.1)
Sodium: 140 mmol/L (ref 135–145)

## 2018-05-27 LAB — CBC
HCT: 29.9 % — ABNORMAL LOW (ref 36.0–46.0)
Hemoglobin: 9.5 g/dL — ABNORMAL LOW (ref 12.0–15.0)
MCH: 30.2 pg (ref 26.0–34.0)
MCHC: 31.8 g/dL (ref 30.0–36.0)
MCV: 94.9 fL (ref 80.0–100.0)
Platelets: 231 10*3/uL (ref 150–400)
RBC: 3.15 MIL/uL — ABNORMAL LOW (ref 3.87–5.11)
RDW: 16 % — ABNORMAL HIGH (ref 11.5–15.5)
WBC: 7.2 10*3/uL (ref 4.0–10.5)
nRBC: 0 % (ref 0.0–0.2)

## 2018-05-27 LAB — PROTIME-INR
INR: 1.1 (ref 0.8–1.2)
Prothrombin Time: 14 seconds (ref 11.4–15.2)

## 2018-05-27 LAB — HEPARIN LEVEL (UNFRACTIONATED): Heparin Unfractionated: 0.69 IU/mL (ref 0.30–0.70)

## 2018-05-27 SURGERY — COLONOSCOPY WITH PROPOFOL
Anesthesia: Monitor Anesthesia Care

## 2018-05-27 MED ORDER — IPRATROPIUM-ALBUTEROL 0.5-2.5 (3) MG/3ML IN SOLN
3.0000 mL | Freq: Three times a day (TID) | RESPIRATORY_TRACT | Status: DC
  Administered 2018-05-28 – 2018-05-29 (×5): 3 mL via RESPIRATORY_TRACT
  Filled 2018-05-27 (×5): qty 3

## 2018-05-27 MED ORDER — ALBUTEROL SULFATE (2.5 MG/3ML) 0.083% IN NEBU: 2.5000 mg | INHALATION_SOLUTION | Freq: Four times a day (QID) | RESPIRATORY_TRACT | Status: DC | PRN

## 2018-05-27 NOTE — TOC Progression Note (Signed)
Transition of Care [facility]) - Progression Note    Patient Details  Name: Emberlyn Burlison MRN: [id number] Date of Birth: Jul 09, 1950  [facility]) CM/SW Contact  Ihor Gully, LCSW Phone Number: 05/27/2018, 3:48 PM  Clinical Narrative:    Patient has agreed to go to [facility], [address] [place], [place] [address].  Bambi, [phone], at the facility advised that after checking patient's benefits has 100 days of SNF benefits.  Patient will need a negative COVID result within 48 hours of admitting to facility (attending notified). Facility will need discharge clinicals [facility], discharge summary and COVID results) faxed to [phone].   Expected Discharge Plan: [place]    Expected Discharge Plan and Services Expected Discharge Plan: [place] Choice: [place] arrangements for the past 2 months: Single Family Home                                       Social Determinants of Health (SDOH) Interventions    Readmission Risk Interventions No flowsheet data found.

## 2018-05-27 NOTE — Progress Notes (Signed)
[name] ANTICOAGULATION CONSULT NOTE - Initial Consult  Pharmacy Consult for heparin gtt  Indication: factor V leiden consult from Dr Laural Golden  Allergies  Allergen Reactions  . Dilaudid [Hydromorphone Hcl] Hives and Nausea Only  . Minocycline Hcl     REACTION: Dizzy  . Prednisone     REACTION: feels like throat swelling, hallucinations  . Varenicline Tartrate     REACTION: Dizzy(chantix)   . Zocor [Simvastatin - High Dose] Other (See Comments)    myalgia    Patient Measurements: Height: 5\' 4"  (162.6 cm) Weight: 160 lb 4.4 oz (72.7 kg) IBW/kg (Calculated) : 54.7 Heparin Dosing Weight: HEPARIN DW (KG): 69.7   Vital Signs: Temp: 98.6 F (37 C) (05/17 2153) Temp Source: Oral (05/17 2153) BP: 119/51 (05/17 2153) Pulse Rate: 84 (05/17 2153)  Labs: Recent Labs    05/24/18 0443  05/25/18 0441 05/25/18 0916 05/26/18 0457 05/26/18 1617 05/27/18 0151  HGB 8.8*  --  8.8*  --   --   --  9.5*  HCT 28.3*  --  28.3*  --   --   --  29.9*  PLT 207  --  227  --   --   --  231  LABPROT 44.6*   < >  --  46.9* 15.4*  --  14.0  INR 4.9*   < >  --  5.2* 1.2  --  1.1  HEPARINUNFRC  --   --   --   --   --  0.85* 0.69  CREATININE 0.63  --   --   --   --   --   --    < > = values in this interval not displayed.    Estimated Creatinine Clearance: 65.8 mL/min (by C-G formula based on SCr of 0.63 mg/dL).   Medical History: Past Medical History:  Diagnosis Date  . Acute MI ([place])    x4, code blue x3  . Arteriosclerotic cardiovascular disease (ASCVD) 2002   Inf STEMI-2002. 2003-cutting balloon + brachytherapy for restenosis; subsequent acute stent thrombosis 06/2010 requiring 2 separate interventions (Zeta stent, then repeat cath with thrombectomy). focal basal inf AK, nl EF; 03/2011: Patent stents, minor nonobst  residual dz, nl EF; neg stress nuclear in 2008 and stress echo in 2009  . Chronic anticoagulation    Warfarin plus ticagrelor  . Chronic respiratory failure ([place]) 08/27/2013   On 2L 02   . 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 COPD (chronic obstructive pulmonary disease) ([place])   . Factor 5 Leiden mutation, heterozygous ([place])   . Factor V Leiden, prothrombin gene mutation ([place]) 2006  . Hyperlipidemia   . Hypothyroidism   . Noncompliance   . Pelvic fracture ([place]) 2009  . Pulmonary embolism ([place]) 2006   Associated with deep vein thrombosis-2006; + factor V Leiden  . Tobacco abuse    50 pack years    Medications:  Medications Prior to Admission  Medication Sig Dispense Refill Last Dose  . albuterol (PROVENTIL) (2.5 MG/3ML) 0.083% nebulizer solution USE 1 VIAL BY NEBULIZER EVERY 4 HOURS AS NEEDED FOR WHEEZING. DX: J44.9 (Patient taking differently: Take 2.5 mg by nebulization every 4 (four) hours as needed for wheezing or shortness of breath. ) 375 vial 0 unknown  . arformoterol (BROVANA) 15 MCG/2ML NEBU Take 2 mLs (15 mcg total) by nebulization 2 (two) times daily. 120 mL 6 02/15/2018 at Unknown time  . baclofen (LIORESAL) 10 MG tablet Take 1 tablet (10 mg  total) by mouth 3 (three) times daily. 15 tablet 0 05/22/2018 at Unknown time  . budesonide (PULMICORT) 0.5 MG/2ML nebulizer solution Take 2 mLs (0.5 mg total) by nebulization 2 (two) times daily. Dx: J43.9 120 mL 3 02/15/2018 at Unknown time  . buPROPion (WELLBUTRIN SR) 100 MG 12 hr tablet Take 100 mg by mouth every morning.   05/22/2018 at Unknown time  . citalopram (CELEXA) 10 MG tablet Take 1 tablet by mouth daily.   05/22/2018 at Unknown time  . fentaNYL (DURAGESIC) 50 MCG/HR Place 1 patch onto the skin every 3 (three) days. 3 patch 0 05/22/2018 at Unknown time  . ferrous sulfate 324 (65 Fe) MG TBEC Take 1 tablet (325 mg total) by mouth daily with breakfast. 30 tablet  05/22/2018 at Unknown time  . folic acid (FOLVITE) 1 MG tablet Take 1 tablet (1 mg total) by mouth daily.  3 05/22/2018 at Unknown time  . HYDROcodone-acetaminophen (NORCO/VICODIN) 5-325 MG tablet Take 1 tablet by mouth every 6  (six) hours as needed (BREAKTHROUGH PAIN). 10 tablet 0   . ipratropium-albuterol (DUONEB) 0.5-2.5 (3) MG/3ML SOLN Take 3 mLs by nebulization 4 (four) times daily. 360 mL 3 05/23/2018 at Unknown time  . levothyroxine (SYNTHROID, LEVOTHROID) 125 MCG tablet Take 1 tablet (125 mcg total) by mouth daily. 90 tablet 3 05/22/2018 at Unknown time  . nitroGLYCERIN (NITROSTAT) 0.4 MG SL tablet Place 1 tablet (0.4 mg total) under the tongue every 5 (five) minutes as needed for chest pain. 25 tablet 3 unknown  . Omega-3 Fatty Acids (FISH OIL) 600 MG CAPS Take 1 capsule by mouth daily.    05/22/2018 at Unknown time  . Respiratory Therapy Supplies (FLUTTER) DEVI Use as directed 1 each 0 unknown  . ticagrelor (BRILINTA) 60 MG TABS tablet Take 1 tablet (60 mg total) by mouth 2 (two) times daily.   05/22/2018 at 2100  . traZODone (DESYREL) 50 MG tablet Take 50 mg by mouth at bedtime as needed for sleep.   05/22/2018 at Unknown time  . warfarin (COUMADIN) 1 MG tablet Take 1 mg by mouth See admin instructions. Alternates taking 6mg  with 5mg  daily. Takes 5mg  on Mondays, Wednesdays, and Fridays. Takes 6mg  on all other days   05/22/2018 at 900  . warfarin (COUMADIN) 5 MG tablet TAKE 1 TABLET BY MOUTH ONCE DAILY 90 tablet 3 Past Week at Unknown time  . lidocaine (LIDODERM) 5 % Place 1 patch onto the skin daily. Remove & Discard patch within 12 hours or as directed by MD (Patient not taking: Reported on 05/23/2018) 30 patch 0 Not Taking at Unknown time  . pantoprazole (PROTONIX) 40 MG tablet Take 1 tablet (40 mg total) by mouth daily.     . polyethylene glycol (MIRALAX / GLYCOLAX) packet Take 17 g by mouth daily. (Patient not taking: Reported on 02/15/2018) 14 each 0 Not Taking at Unknown time   Scheduled:  . arformoterol  15 mcg Nebulization BID  . budesonide (PULMICORT) nebulizer solution  0.5 mg Nebulization BID  . buPROPion  100 mg Oral q morning - 10a  . citalopram  10 mg Oral Daily  . dextromethorphan-guaiFENesin  1 tablet  Oral BID  . feeding supplement  1 Container Oral BID BM  . feeding supplement (PRO-STAT SUGAR FREE 64)  30 mL Oral BID  . folic acid  1 mg Oral Daily  . ipratropium-albuterol  3 mL Nebulization Q6H  . levothyroxine  125 mcg Oral Q0600  . mouth rinse  15 mL Mouth  Rinse BID  . methylPREDNISolone (SOLU-MEDROL) injection  40 mg Intravenous Q12H  . multivitamin with minerals  1 tablet Oral Daily  . nicotine  21 mg Transdermal Daily  . omega-3 acid ethyl esters  1 capsule Oral Daily  . pantoprazole (PROTONIX) IV  40 mg Intravenous Q12H  . polyethylene glycol-electrolytes  4,000 mL Oral Once   Infusions:  . sodium chloride Stopped (05/23/18 1538)  . sodium chloride 50 mL/hr at 05/25/18 2015  . sodium chloride 20 mL/hr at 05/26/18 1145  . sodium chloride    . heparin 850 Units/hr (05/26/18 1720)   PRN: bisacodyl, fentaNYL (SUBLIMAZE) injection, HYDROcodone-acetaminophen, traZODone Anti-infectives (From admission, onward)   None      Assessment: [name] a 68 y.o. female requires anticoagulation with a heparin iv infusion for the indication of  factor 5 leiden disease. Heparin gtt will be started following pharmacy protocol per pharmacy consult. Patient is not on previous oral anticoagulant that will require aPTT/HL correlation before transitioning to only HL monitoring.    Heparin level therapeutic at 0.69 s/p rate decrease to 850 units/hr  Goal of Therapy:  Heparin level 0.3-0.7 units/ml Monitor platelets by anticoagulation protocol: Yes   Plan:   Continue heparin gtt at 850 units/hr Heparin gtt to stop 5/18 @0700  F/u s/p procedure plan  Bertis Ruddy, PharmD Clinical Pharmacist Please check AMION for all [place] numbers 05/27/2018 2:26 AM

## 2018-05-27 NOTE — OR Nursing (Signed)
Heparin was to be stopped this morning at 0700, for procedure to start at 1300.   At 12:00 [facility] went to get the patient to bring to preop for her procedure that was scheduled for 1300. The Heparin was still infusing at 850 Units per hour. Dr. Laural Golden notified. Case cancelled will be put back on tomarrow for 1200.

## 2018-05-27 NOTE — Progress Notes (Signed)
  Subjective:  Patient denies shortness of breath chest or abdominal pain.  She says she had an accident around 3:00 this morning when she had large bowel movement.  She was given Dulcolax 10 mg at bedtime.  She is not drinking GoLYTELY.  She says stool is still dark/black.  Objective: Blood pressure (!) 135/118, pulse 85, temperature 98.8 F (37.1 C), temperature source Oral, resp. rate 20, height '5\' 4"'$  (1.626 m), weight 72.7 kg, SpO2 91 %. Patient is alert and in no acute distress. Abdomen is soft and nontender with no organomegaly or masses.  Labs/studies Results:  CBC Latest Ref Rng & Units 05/27/2018 05/25/2018 05/24/2018  WBC 4.0 - 10.5 K/uL 7.2 6.5 3.1(L)  Hemoglobin 12.0 - 15.0 g/dL 9.5(L) 8.8(L) 8.8(L)  Hematocrit 36.0 - 46.0 % 29.9(L) 28.3(L) 28.3(L)  Platelets 150 - 400 K/uL 231 227 207    CMP Latest Ref Rng & Units 05/27/2018 05/24/2018 05/23/2018  Glucose 70 - 99 mg/dL 123(H) 132(H) 94  BUN 8 - 23 mg/dL '17 17 19  '$ Creatinine 0.44 - 1.00 mg/dL 0.70 0.63 0.60  Sodium 135 - 145 mmol/L 140 138 143  Potassium 3.5 - 5.1 mmol/L 3.5 4.2 3.5  Chloride 98 - 111 mmol/L 103 95(L) 96(L)  CO2 22 - 32 mmol/L 31 36(H) 39(H)  Calcium 8.9 - 10.3 mg/dL 7.8(L) 7.9(L) 7.8(L)  Total Protein 6.5 - 8.1 g/dL - 5.7(L) -  Total Bilirubin 0.3 - 1.2 mg/dL - 0.5 -  Alkaline Phos 38 - 126 U/L - 63 -  AST 15 - 41 U/L - 13(L) -  ALT 0 - 44 U/L - 9 -    Hepatic Function Latest Ref Rng & Units 05/24/2018 01/31/2018 01/28/2018  Total Protein 6.5 - 8.1 g/dL 5.7(L) - 6.8  Albumin 3.5 - 5.0 g/dL 2.7(L) 2.3(L) 2.8(L)  AST 15 - 41 U/L 13(L) - 31  ALT 0 - 44 U/L 9 - 37  Alk Phosphatase 38 - 126 U/L 63 - 94  Total Bilirubin 0.3 - 1.2 mg/dL 0.5 - 0.3  Bilirubin, Direct 0.0 - 0.3 mg/dL - - -     Assessment:  #1.  GI bleed.  Patient underwent esophagogastroduodenoscopy yesterday and no bleeding lesion was identified.  She is being prepared for colonoscopy to be performed later today.  #2.  Anemia secondary to GI  bleed.  Patient has received 2 units of PRBCs.  Hemoglobin has gradually coming up which is reassuring.  #3.  COPD.  He remains on bronchodilator therapy as well as Solu-Medrol and appears to be at baseline.  #4.  Patient has factor V Leiden deficiency and history of pulmonary embolism.  INR was supratherapeutic when she came in.  She received vitamin K with correction of coagulopathy and now she is on heparin infusion until warfarin can be resumed.  Heparin stopped this this morning in preparation for colonoscopy.   Plan:  Colonoscopy later this afternoon.

## 2018-05-27 NOTE — Progress Notes (Signed)
Patient only wore CPAP briefly, she stated she felt like it was smothering her tonight. Nothing had changed with settings. Mask was decreased to a smaller from medium as last night she had numerous leaks around face with medium. She states she only wears at home if needed. IT is doubtful she wears at all. She does appear to have some anxiety about smothering. No more attempts will be made tonight. CPAP is by bedside.

## 2018-05-27 NOTE — TOC Progression Note (Signed)
Transition of Care [facility]) - Progression Note    Patient Details  Name: [name] MRN: [id number] Date of Birth: June 25, 1950  [facility] CM/SW Contact  Ihor Gully, LCSW Phone Number: 05/27/2018, 12:23 PM  Clinical Narrative:    Patient was provided bed offers for SNF. She wants to go home with with [facility] services. Patient stated that she wants to speak with her daughter before she makes a decision about SNF. LCSW provided patient with her daughter's number from the chart. LCSW will check back with patient once she speaks with daughter.   Expected Discharge Plan: [place]    Expected Discharge Plan and Services Expected Discharge Plan: [place] Choice: [place] arrangements for the past 2 months: Single Family Home                                       Social Determinants of Health (SDOH) Interventions    Readmission Risk Interventions No flowsheet data found.

## 2018-05-27 NOTE — Care Management Important Message (Signed)
Important Message  Patient Details  Name: [name] MRN: [id number] Date of Birth: 09/29/1950   Medicare Important Message Given:  Yes    [name] 05/27/2018, 3:30 PM

## 2018-05-27 NOTE — Progress Notes (Signed)
Patient refused CPAP For tonight. Hospital unit still at bedside.

## 2018-05-27 NOTE — Progress Notes (Signed)
PROGRESS NOTE    [name]  UXN:[id number] DOB: [birth year] DOA: 05/23/2018 PCP: [name], NP     Brief Narrative:  68 y.o. 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  Patient reports symptom has been present for the last 3 to 4 days and worsening.  She has used home nebulizer without significant improvement.  Patient continues to smoke.  Patient reported intermittent episode of productive cough and also expressed positive/worsening wheezing.  Patient reports intermittent episode of melanotic stools and ongoing sensation of feeling weak and tired at all times.  Patient denies fever, chills, nausea and vomiting; there has not been abdominal pain, no headaches, no chest pain, no diaphoresis, no focal weakness, no dysuria or hematuria. In the ED chest x-ray demonstrated chronic bronchitic/emphysematous changes without acute infiltrates. COVID-19 test neg. No acute ischemic changes on EKG, neg troponin and normal BNP. Hgb down to 6.6 and positive FOBT. ABG with compensated hypercapnia.  She received fluid resuscitation, type and cross with order to initiate transfusion, nebulizer treatment and started on BiPAP to assist with her shortness of breath and hypercapnia.  TRH has been called to admit patient for further evaluation and management of COPD exacerbation and GI bleed.   Assessment & Plan: 1-Acute on chronic respiratory failure in the setting of COPD exacerbation with component of hypercapnia and hypoxia; patient also with low hemoglobin most likely playing a role in her increased shortness of breath. -Continue IV steroids (with ongoing tapering), nebulizer  management, Pulmicort, Brovana, flutter valve and Mucinex. -No antibiotics will be given as there was no infiltrates appreciated on x-ray. -Patient remains afebrile and with normal WBCs. -Continue nightly CPAP  -Follow clinical response.  2-acute blood loss anemia/melena -GI has been consulted and will follow recommendations -Diet advanced as per GI service recommendations. -Continue PPI -After 2 units of PRBCs hemoglobin has remained stable at 8.8 (was 6.6 on admission). -Continue to follow hemoglobin trend intermittently -EGD non revealing of source of bleeding; GI planning for colonoscopy later today (5/18)  3-hypothyroidism -Continue Synthroid  4-tobacco abuse -Cessation counseling has been once again encouraged -Patient has declined the use of nicotine patch.  5-factor V deficiency: With past history of DVT/PE. -Chronically on Coumadin -On presentation INR supratherapeutic, down to 1.2 today -per GI safe to use heparin for bridging.  6-depression/anxiety -No suicidal ideation or hallucinations -Continue the use of Celexa and bupropion. -Patient reports feeling a slightly itchy today, most likely due to steroids.  Anticipate improvement once tapering initiated.  7-chronic pain syndrome -Continue to use Vicodin for breakthrough pain. -For now will continue holding fentanyl patch.  8-physical deconditioning -Physical therapy has evaluated patient and recommended skilled nursing facility for rehabilitation and conditioning -TOC aware and assisting with placement. -Following nursing home recommendations will repeat COVID-19 testing.  9-stage II pressure injury -Continue preventive measures and frequent repositioning.  10-moderate protein-calorie malnutrition -Follow dietitian services recommendations for feeding supplements.  DVT prophylaxis: SCDs Code Status: Full code Family Communication: No family at bedside. Disposition Plan: Remains inpatient, continue CPAP  nightly, continue nebulizer treatment, continue IV steroids (with ongoing tapering), continue oxygen supplementation, continue flutter valve, Pulmicort and Brovana.  INR 1.1 today, heparin drip for bridging started .  Last Hemoglobin stable at 8.8.  Consultants:   Gastroenterology  service  Procedures:   See below for x-ray reports.  Antimicrobials:  Anti-infectives (From admission, onward)   None      Subjective: Afebrile, no chest pain, no nausea, no vomiting.  Reports history of sudden shortness of breath with exertion but much better overall.  Patient was able to speak in full sentences and denied overt bleeding.  Objective: Vitals:   05/27/18 0511 05/27/18 0839 05/27/18 1343 05/27/18 1421  BP: (!) 135/118   (!) 121/53  Pulse: 85   70  Resp: 20   18  Temp: 98.8 F (37.1 C)   98.5 F (36.9 C)  TempSrc: Oral   Oral  SpO2: 91% 98% 92%   Weight:      Height:        Intake/Output Summary (Last 24 hours) at 05/27/2018 1559 Last data filed at 05/27/2018 1300 Gross per 24 hour  Intake 1153.52 ml  Output 1251 ml  Net -97.48 ml   Filed Weights   05/23/18 1507 05/24/18 0500 05/25/18 2035  Weight: 68.1 kg 69.4 kg 72.7 kg    Examination: General exam: Alert, awake, oriented x 3; no fever, no nausea, no vomiting.  Reports still some shortness of breath with exertion; but was able to speak in full sentences.  No overt bleeding reported. Respiratory system: No using accessory muscles.  Very little expiratory wheezing on exam; positive rhonchi.  Good oxygen saturation on 2 L. Cardiovascular system:RRR. No murmurs, rubs, gallops. Gastrointestinal system: Abdomen is nondistended, soft and nontender. No organomegaly or masses felt. Normal bowel sounds heard. Central nervous system: Alert and oriented. No focal neurological deficits. Extremities: No C/C/E, +pedal pulses Skin: No rashes, no petechiae.  Stage II buttocks pressure injury without signs of superimposed infection  appreciated. Psychiatry: Judgement and insight appear normal. Mood & affect appropriate.    Data Reviewed: I have personally reviewed following labs and imaging studies  CBC: Recent Labs  Lab 05/23/18 1027 05/24/18 0443 05/25/18 0441 05/27/18 0151  WBC 4.7 3.1* 6.5 7.2  NEUTROABS 3.3  --   --   --   HGB 6.6* 8.8* 8.8* 9.5*  HCT 22.3* 28.3* 28.3* 29.9*  MCV 97.0 94.6 95.3 94.9  PLT 203 207 227 403   Basic Metabolic Panel: Recent Labs  Lab 05/23/18 1027 05/24/18 0443 05/27/18 0151  NA 143 138 140  K 3.5 4.2 3.5  CL 96* 95* 103  CO2 39* 36* 31  GLUCOSE 94 132* 123*  BUN 19 17 17   CREATININE 0.60 0.63 0.70  CALCIUM 7.8* 7.9* 7.8*   GFR: Estimated Creatinine Clearance: 65.8 mL/min (by C-G formula based on SCr of 0.7 mg/dL).   Liver Function Tests: Recent Labs  Lab 05/24/18 0443  AST 13*  ALT 9  ALKPHOS 63  BILITOT 0.5  PROT 5.7*  ALBUMIN 2.7*   Coagulation Profile: Recent Labs  Lab 05/24/18 0443 05/24/18 0826 05/25/18 0916 05/26/18 0457 05/27/18 0151  INR 4.9* 4.4* 5.2* 1.2 1.1   Cardiac Enzymes: Recent Labs  Lab 05/23/18 1027  TROPONINI <0.03   Urine analysis:    Component Value Date/Time   COLORURINE YELLOW 05/23/2018 [place] 05/23/2018 0934   LABSPEC 1.014 05/23/2018 [place] 7.0 05/23/2018 0934   GLUCOSEU NEGATIVE 05/23/2018 0934   HGBUR SMALL (A) 05/23/2018 [place] 05/23/2018 0934   BILIRUBINUR n 09/21/2016 1432   KETONESUR NEGATIVE 05/23/2018 0934   PROTEINUR 30 (A) 05/23/2018 0934   UROBILINOGEN 0.2 09/21/2016 1432   UROBILINOGEN  0.2 04/13/2009 2025   NITRITE NEGATIVE 05/23/2018 [place] 05/23/2018 1610    Recent Results (from the past 240 hour(s))  Urine culture     Status: Abnormal   Collection Time: 05/23/18  9:35 AM  Result Value Ref Range Status   Specimen Description   Final    URINE, CLEAN CATCH Performed at [facility], [address], [place],  [place] [address]    Special Requests   Final    NONE Performed at [facility], [address] [place], [place] [address]    Culture MULTIPLE SPECIES PRESENT, SUGGEST RECOLLECTION (A)  Final   Report Status 05/24/2018 FINAL  Final  SARS Coronavirus 2 (CEPHEID- Performed in [place] hospital lab), Hosp Order     Status: None   Collection Time: 05/23/18 10:00 AM  Result Value Ref Range Status   SARS Coronavirus 2 NEGATIVE NEGATIVE Final    Comment: (NOTE) If result is NEGATIVE SARS-CoV-2 target nucleic acids are NOT DETECTED. The SARS-CoV-2 RNA is generally detectable in upper and lower  respiratory specimens during the acute phase of infection. The lowest  concentration of SARS-CoV-2 viral copies this assay can detect is 250  copies / mL. A negative result does not preclude SARS-CoV-2 infection  and should not be used as the sole basis for treatment or other  patient management decisions.  A negative result may occur with  improper specimen collection / handling, submission of specimen other  than nasopharyngeal swab, presence of viral mutation(s) within the  areas targeted by this assay, and inadequate number of viral copies  (<250 copies / mL). A negative result must be combined with clinical  observations, patient history, and epidemiological information. If result is POSITIVE SARS-CoV-2 target nucleic acids are DETECTED. The SARS-CoV-2 RNA is generally detectable in upper and lower  respiratory specimens dur ing the acute phase of infection.  Positive  results are indicative of active infection with SARS-CoV-2.  Clinical  correlation with patient history and other diagnostic information is  necessary to determine patient infection status.  Positive results do  not rule out bacterial infection or co-infection with other viruses. If result is PRESUMPTIVE POSTIVE SARS-CoV-2 nucleic acids MAY BE PRESENT.   A presumptive positive result was obtained on the submitted specimen  and  confirmed on repeat testing.  While 2019 novel coronavirus  (SARS-CoV-2) nucleic acids may be present in the submitted sample  additional confirmatory testing may be necessary for epidemiological  and / or clinical management purposes  to differentiate between  SARS-CoV-2 and other Sarbecovirus currently known to infect humans.  If clinically indicated additional testing with an alternate test  methodology [phone]) is advised. The SARS-CoV-2 RNA is generally  detectable in upper and lower respiratory sp ecimens during the acute  phase of infection. The expected result is Negative. Fact Sheet for Patients:  StrictlyIdeas.no Fact Sheet for Healthcare Providers: BankingDealers.co.za This test is not yet approved or cleared by the [place] FDA and has been authorized for detection and/or diagnosis of SARS-CoV-2 by FDA under an Emergency Use Authorization (EUA).  This EUA will remain in effect (meaning this test can be used) for the duration of the COVID-19 declaration under Section 564(b)(1) of the Act, 21 U.S.C. section 360bbb-3(b)(1), unless the authorization is terminated or revoked sooner. Performed at [facility], [address], [place], [place] [address]   MRSA PCR Screening     Status: None   Collection Time: 05/23/18  3:03 PM  Result Value  Ref Range Status   MRSA by PCR NEGATIVE NEGATIVE Final    Comment:        The GeneXpert MRSA Assay (FDA approved for NASAL specimens only), is one component of a comprehensive MRSA colonization surveillance program. It is not intended to diagnose MRSA infection nor to guide or monitor treatment for MRSA infections. Performed at [facility], [address] [place], [place] [address]      Radiology Studies: No results found.  Scheduled Meds: . arformoterol  15 mcg Nebulization BID  . budesonide (PULMICORT) nebulizer solution  0.5 mg Nebulization BID  . buPROPion  100 mg Oral q  morning - 10a  . citalopram  10 mg Oral Daily  . dextromethorphan-guaiFENesin  1 tablet Oral BID  . feeding supplement  1 Container Oral BID BM  . feeding supplement (PRO-STAT SUGAR FREE 64)  30 mL Oral BID  . folic acid  1 mg Oral Daily  . ipratropium-albuterol  3 mL Nebulization Q6H  . levothyroxine  125 mcg Oral Q0600  . mouth rinse  15 mL Mouth Rinse BID  . methylPREDNISolone (SOLU-MEDROL) injection  40 mg Intravenous Q12H  . multivitamin with minerals  1 tablet Oral Daily  . nicotine  21 mg Transdermal Daily  . omega-3 acid ethyl esters  1 capsule Oral Daily  . pantoprazole (PROTONIX) IV  40 mg Intravenous Q12H  . polyethylene glycol-electrolytes  4,000 mL Oral Once   Continuous Infusions: . sodium chloride Stopped (05/23/18 1538)  . sodium chloride 50 mL/hr at 05/25/18 2015  . sodium chloride 20 mL/hr at 05/26/18 1145  . sodium chloride       LOS: 4 days    Time spent: 30 minutes   Barton Dubois, MD [facility] Pager [phone]   05/27/2018, 3:59 PM

## 2018-05-27 NOTE — Progress Notes (Signed)
Patient's heparin was turned off by the nursing staff. However when it was stopped when to bring the patient down she was receiving heparin. Therefore procedure postponed until tomorrow noon. Patient is prepped does not appear to be satisfactory.  She has drank very little alcohol likely since I saw her this morning. She will stay on clear liquids today.

## 2018-05-28 ENCOUNTER — Inpatient Hospital Stay (HOSPITAL_COMMUNITY): Payer: Medicare Other | Admitting: Anesthesiology

## 2018-05-28 ENCOUNTER — Encounter (HOSPITAL_COMMUNITY)
Admission: EM | Disposition: A | Discharge status: Home Health | Payer: Self-pay | Source: Home / Self Care | Attending: Internal Medicine

## 2018-05-28 DIAGNOSIS — K644 Residual hemorrhoidal skin tags: Secondary | ICD-10-CM | POA: Diagnosis not present

## 2018-05-28 DIAGNOSIS — K921 Melena: Secondary | ICD-10-CM | POA: Diagnosis not present

## 2018-05-28 DIAGNOSIS — D120 Benign neoplasm of cecum: Secondary | ICD-10-CM

## 2018-05-28 DIAGNOSIS — D123 Benign neoplasm of transverse colon: Secondary | ICD-10-CM

## 2018-05-28 DIAGNOSIS — K635 Polyp of colon: Secondary | ICD-10-CM | POA: Diagnosis not present

## 2018-05-28 DIAGNOSIS — K5730 Diverticulosis of large intestine without perforation or abscess without bleeding: Secondary | ICD-10-CM | POA: Diagnosis not present

## 2018-05-28 DIAGNOSIS — K6289 Other specified diseases of anus and rectum: Secondary | ICD-10-CM

## 2018-05-28 HISTORY — PX: POLYPECTOMY: SHX5525

## 2018-05-28 HISTORY — PX: COLONOSCOPY WITH PROPOFOL: SHX5780

## 2018-05-28 LAB — PROTIME-INR
INR: 1.1 (ref 0.8–1.2)
Prothrombin Time: 13.6 seconds (ref 11.4–15.2)

## 2018-05-28 SURGERY — COLONOSCOPY WITH PROPOFOL
Anesthesia: Monitor Anesthesia Care

## 2018-05-28 MED ORDER — PROPOFOL 500 MG/50ML IV EMUL
INTRAVENOUS | Status: DC | PRN
  Administered 2018-05-28: 125 ug/kg/min via INTRAVENOUS
  Administered 2018-05-28: 12:00:00 via INTRAVENOUS

## 2018-05-28 MED ORDER — PROPOFOL 10 MG/ML IV BOLUS
INTRAVENOUS | Status: DC | PRN
  Administered 2018-05-28 (×3): 15 mg via INTRAVENOUS

## 2018-05-28 MED ORDER — KETAMINE HCL 10 MG/ML IJ SOLN
INTRAMUSCULAR | Status: DC | PRN
  Administered 2018-05-28: 10 mg via INTRAVENOUS

## 2018-05-28 MED ORDER — PREDNISONE 20 MG PO TABS: 40.0000 mg | ORAL_TABLET | Freq: Every day | ORAL | Status: DC

## 2018-05-28 MED ORDER — SODIUM CHLORIDE 0.9 % IV SOLN: INTRAVENOUS | Status: DC

## 2018-05-28 MED ORDER — LACTATED RINGERS IV SOLN
INTRAVENOUS | Status: DC
  Administered 2018-05-28: 1000 mL via INTRAVENOUS

## 2018-05-28 MED ORDER — PANTOPRAZOLE SODIUM 40 MG PO TBEC
40.0000 mg | DELAYED_RELEASE_TABLET | Freq: Every day | ORAL | Status: DC
  Administered 2018-05-29: 40 mg via ORAL
  Filled 2018-05-28: qty 1

## 2018-05-28 MED ORDER — METHYLPREDNISOLONE SODIUM SUCC 40 MG IJ SOLR
40.0000 mg | Freq: Two times a day (BID) | INTRAMUSCULAR | Status: DC
  Administered 2018-05-28 – 2018-05-29 (×2): 40 mg via INTRAVENOUS
  Filled 2018-05-28 (×2): qty 1

## 2018-05-28 NOTE — Progress Notes (Signed)
Pt states she does not want to wear CPAP machine tonight and that she's breathing okay without it. Will call if she needs it. RT will continue to monitor nurse informed

## 2018-05-28 NOTE — Transfer of Care (Signed)
Immediate Anesthesia Transfer of Care Note  Patient: [name]  Procedure(s) Performed: COLONOSCOPY WITH PROPOFOL (N/A ) POLYPECTOMY  Patient Location: PACU  Anesthesia Type:MAC  Level of Consciousness: awake and patient cooperative  Airway & Oxygen Therapy: Patient Spontanous Breathing and Patient connected to nasal cannula oxygen  Post-op Assessment: Report given to RN, Post -op Vital signs reviewed and stable and Patient moving all extremities  Post vital signs: Reviewed and stable  Last Vitals:  Vitals Value Taken Time  BP    Temp    Pulse    Resp    SpO2      Last Pain:  Vitals:   05/28/18 1101  TempSrc: Oral  PainSc: 0-No pain      Patients Stated Pain Goal: 1 (40/97/35 3299)  Complications: No apparent anesthesia complications

## 2018-05-28 NOTE — Op Note (Signed)
[facility] Patient Name: [name] Procedure Date: 05/28/2018 11:43 AM MRN: [id number] Date of Birth: 05/21/1950 Attending MD: Hildred Laser , MD CSN: [id number] Age: 68 Admit Type: Inpatient Procedure:                Colonoscopy Indications:              Melena Providers:                Hildred Laser, MD, Gwynneth Albright RN, RN,                            Nelma Rothman, Technician Referring MD:             Barton Dubois, MD Medicines:                Propofol per Anesthesia Complications:            No immediate complications. Estimated Blood Loss:     Estimated blood loss: none. Estimated blood loss                            was minimal. Procedure:                Pre-Anesthesia Assessment:                           - Prior to the procedure, a History and Physical                            was performed, and patient medications and                            allergies were reviewed. The patient's tolerance of                            previous anesthesia was also reviewed. The risks                            and benefits of the procedure and the sedation                            options and risks were discussed with the patient.                            All questions were answered, and informed consent                            was obtained. Prior Anticoagulants: The patient                            last took Coumadin (warfarin) 5 days and heparin 1                            day prior to the procedure. ASA Grade Assessment:  III - A patient with severe systemic disease. After                            reviewing the risks and benefits, the patient was                            deemed in satisfactory condition to undergo the                            procedure.                           After obtaining informed consent, the colonoscope                            was passed under direct vision. Throughout the      procedure, the patient's blood pressure, pulse, and                            oxygen saturations were monitored continuously. The                            PCF-[id number] ([id number]) was introduced through the                            anus and advanced to the the cecum, identified by                            appendiceal orifice and ileocecal valve. The                            colonoscopy was performed without difficulty. The                            patient tolerated the procedure well. The quality                            of the bowel preparation was good. Scope In: 11:52:33 AM Scope Out: 12:19:37 PM Scope Withdrawal Time: 0 hours 14 minutes 53 seconds  Total Procedure Duration: 0 hours 27 minutes 4 seconds  Findings:      The digital rectal exam findings include decreased sphincter tone.      A 7 mm polyp was found in the cecum. The polyp was semi-sessile. The       polyp was removed with a cold snare. Resection and retrieval were       complete. To stop active bleeding, two hemostatic clips were       successfully placed (MR conditional). There was no bleeding at the end       of the procedure. The pathology specimen was placed into Bottle Number 1.      A small polyp was found in the hepatic flexure. The polyp was sessile.       Biopsies were taken with a cold forceps for histology. The pathology       specimen was placed into Bottle Number 1.  Scattered small and large-mouthed diverticula were found in the sigmoid       colon.      External hemorrhoids were found during retroflexion. The hemorrhoids       were small. Impression:               - Decreased sphincter tone found on digital rectal                            exam. Small superficial ulcer noted at 5 o'clock                            away from anal orifice.                           - One 7 mm polyp in the cecum, removed with a cold                            snare. Resected and retrieved. Clips (MR                             conditional) were placed.                           - One small polyp at the hepatic flexure. Biopsied.                           - Diverticulosis in the sigmoid colon.                           - External hemorrhoids.                           ? Diverticular bleed or source in small bowel. Moderate Sedation:      Per Anesthesia Care Recommendation:           - Return patient to hospital ward for ongoing care.                           - Cardiac diet today.                           - Continue present medications.                           - Resume Coumadin (warfarin) at prior dose today.                           - Heparin can be resumed tomoroow morning if she                            needs to be bridged.                           - Await pathology results.                           -  Need for further evaluation will be determined at                            the time of OV in few weeks.                           - Repeat colonoscopy is recommended. The                            colonoscopy date will be determined after pathology                            results from today's exam become available for                            review. Procedure Code(s):        --- Professional ---                           [phone], Colonoscopy, flexible; with removal of                            tumor(s), polyp(s), or other lesion(s) by snare                            technique                           45380, 13, Colonoscopy, flexible; with biopsy,                            single or multiple Diagnosis Code(s):        --- Professional ---                           K62.89, Other specified diseases of anus and rectum                           K63.5, Polyp of colon                           K64.4, Residual hemorrhoidal skin tags                           K92.1, Melena (includes Hematochezia)                           K57.30, Diverticulosis of large intestine without                             perforation or abscess without bleeding CPT copyright 2019 American Medical Association. All rights reserved. The codes documented in this report are preliminary and upon coder review may  be revised to meet current compliance requirements. Hildred Laser, MD Hildred Laser, MD 05/28/2018 1:42:44 PM This report has been signed electronically. Number of Addenda: 0

## 2018-05-28 NOTE — Progress Notes (Signed)
Physical Therapy Treatment Patient Details Name: [name] MRN: [id number] DOB: 1950/05/08 Today's Date: 05/28/2018    History of Present Illness [name] is a 68 y.o. 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  Patient reports symptom has been present for the last 3 to 4 days and worsening.  She has used home nebulizer without significant improvement.  Patient continues to smoke.  Patient reported intermittent episode of productive cough and also expressed positive/worsening wheezing.  Patient reports intermittent episode of melanotic stools and ongoing sensation of feeling weak and tired at all times.     PT Comments    Patient required encouragement to participate, demonstrates good return for completing BLE ROM/strengthening exercises while seated at bedside without loss of sitting balance, limited to ambulation in room only due to c/o fatigue and feeling cold.  Patient tolerated long sitting in bed to eat her snacks after therapy.  Patient will benefit from continued physical therapy in hospital and recommended venue below to increase strength, balance, endurance for safe ADLs and gait.    Follow Up Recommendations  SNF;Supervision - Intermittent;Supervision for mobility/OOB     Equipment Recommendations  3in1 (PT)    Recommendations for Other Services       Precautions / Restrictions Precautions Precautions: Fall Restrictions Weight Bearing Restrictions: No    Mobility  Bed Mobility Overal bed mobility: Needs Assistance Bed Mobility: Supine to Sit;Sit to Supine Rolling: Supervision Sidelying to sit: Supervision       General bed mobility  comments: slightly labored movement  Transfers Overall transfer level: Needs assistance Equipment used: Rolling walker (2 wheeled) Transfers: Sit to/from [facility] Sit to Stand: Min guard Stand pivot transfers: Min guard       General transfer comment: increased time, labored movement  Ambulation/Gait Ambulation/Gait assistance: Min assist Gait Distance (Feet): 20 Feet Assistive device: Rolling walker (2 wheeled) Gait Pattern/deviations: Decreased step length - right;Decreased step length - left;Decreased stride length Gait velocity: decreased   General Gait Details: slow labored cadence with occasional stumbling without loss of balance, limited secondary to c/o fatigue, on 2 LPM O2   Stairs             Wheelchair Mobility    Modified Rankin (Stroke Patients Only)       Balance Overall balance assessment: Needs assistance Sitting-balance support: Feet supported;No upper extremity supported Sitting balance-Leahy Scale: Good     Standing balance support: Bilateral upper extremity supported;During functional activity Standing balance-Leahy Scale: Fair Standing balance comment: using RW                            Cognition Arousal/Alertness: Awake/alert Behavior During Therapy: WFL for tasks assessed/performed Overall Cognitive Status: Within Functional Limits for tasks assessed                                        Exercises General Exercises - Lower Extremity Long Arc Quad: Seated;Strengthening;AROM;Both;10 reps Hip Flexion/Marching: Seated;Strengthening;AROM;Both;10 reps Toe Raises: Seated;Strengthening;AROM;Both;10 reps Heel Raises: Seated;AROM;Strengthening;Both;10 reps    General Comments        Pertinent Vitals/Pain Pain Assessment: Faces Faces Pain  Scale: Hurts a little bit Pain Location: low back Pain Descriptors / Indicators: Aching Pain Intervention(s): Limited activity within patient's  tolerance;Monitored during session    Home Living                      Prior Function            PT Goals (current goals can now be found in the care plan section) Acute Rehab PT Goals Patient Stated Goal: To improve strength and independence to go home PT Goal Formulation: With patient Time For Goal Achievement: 06/07/18 Potential to Achieve Goals: Good Progress towards PT goals: Progressing toward goals    Frequency    Min 3X/week      PT Plan Current plan remains appropriate    Co-evaluation              AM-PAC PT "6 Clicks" Mobility   Outcome Measure  Help needed turning from your back to your side while in a flat bed without using bedrails?: None Help needed moving from lying on your back to sitting on the side of a flat bed without using bedrails?: None Help needed moving to and from a bed to a chair (including a wheelchair)?: A Little Help needed standing up from a chair using your arms (e.g., wheelchair or bedside chair)?: A Little Help needed to walk in hospital room?: A Little Help needed climbing 3-5 steps with a railing? : A Lot 6 Click Score: 19    End of Session Equipment Utilized During Treatment: Oxygen Activity Tolerance: Patient tolerated treatment well;Patient limited by fatigue Patient left: in bed;with call bell/phone within reach(long sitting in bed) Nurse Communication: Mobility status PT Visit Diagnosis: Unsteadiness on feet (R26.81);Other abnormalities of gait and mobility (R26.89);Muscle weakness (generalized) (M62.81)     Time: 4098-1191 PT Time Calculation (min) (ACUTE ONLY): 22 min  Charges:  $Therapeutic Activity: 8-22 mins                     3:33 PM, 05/28/18 Lonell Grandchild, MPT Physical Therapist with [facility] [phone] office [phone] mobile phone

## 2018-05-28 NOTE — Progress Notes (Signed)
PROGRESS NOTE    [name]  MVH:[id number] DOB: 08-Jan-1951 DOA: 05/23/2018 PCP: [name], NP     Brief Narrative:  68 y.o. 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  Patient reports symptom has been present for the last 3 to 4 days and worsening.  She has used home nebulizer without significant improvement.  Patient continues to smoke.  Patient reported intermittent episode of productive cough and also expressed positive/worsening wheezing.  Patient reports intermittent episode of melanotic stools and ongoing sensation of feeling weak and tired at all times.  Patient denies fever, chills, nausea and vomiting; there has not been abdominal pain, no headaches, no chest pain, no diaphoresis, no focal weakness, no dysuria or hematuria. In the ED chest x-ray demonstrated chronic bronchitic/emphysematous changes without acute infiltrates. COVID-19 test neg. No acute ischemic changes on EKG, neg troponin and normal BNP. Hgb down to 6.6 and positive FOBT. ABG with compensated hypercapnia.  She received fluid resuscitation, type and cross with order to initiate transfusion, nebulizer treatment and started on BiPAP to assist with her shortness of breath and hypercapnia.  TRH has been called to admit patient for further evaluation and management of COPD exacerbation and GI bleed.   Assessment & Plan: 1-Acute on chronic respiratory failure in the setting of COPD exacerbation with component of hypercapnia and hypoxia; patient also with low hemoglobin most likely playing a role in her increased shortness of breath. -Continue IV steroids (with ongoing tapering), nebulizer  management, Pulmicort, Brovana, flutter valve and Mucinex. -No antibiotics will be given as there was no infiltrates appreciated on x-ray. -Patient remains afebrile and with normal WBCs. -Continue nightly CPAP  -Follow clinical response.  2-acute blood loss anemia/melena -GI has been consulted and will follow recommendations -Diet advanced as per GI service recommendations. -Continue PPI -After 2 units of PRBCs hemoglobin has remained stable at 8.8 (was 6.6 on admission). -Continue to follow hemoglobin trend intermittently -EGD non revealing of source of bleeding; due to poor preparation And use of heparin to close for the time of procedure colonoscopy was canceled on 05/27/2018.  Plan to be done later today.  3-hypothyroidism -Continue Synthroid  4-tobacco abuse -Cessation counseling has been once again encouraged -Patient has declined the use of nicotine patch.  5-factor V deficiency: With past history of DVT/PE. -Chronically on Coumadin -On presentation INR supratherapeutic, down to 1.2 today -per GI safe to use heparin for bridging.  6-depression/anxiety -No suicidal ideation or hallucinations -Continue the use of Celexa and bupropion. -Patient reports feeling a slightly itchy today, most likely due to steroids.  Anticipate improvement once tapering initiated.  7-chronic pain syndrome -Continue to use Vicodin for breakthrough pain. -For now will continue holding fentanyl patch.  8-physical deconditioning -Physical therapy has evaluated patient and recommended skilled nursing facility for rehabilitation and conditioning -TOC aware and assisting with placement. -Following nursing home recommendations will repeat COVID-19 testing.  9-stage II pressure injury -Continue preventive measures and frequent repositioning.  10-moderate protein-calorie malnutrition -Follow dietitian services recommendations for feeding supplements.  DVT prophylaxis: SCDs Code Status: Full code  Family Communication: No family at bedside. Disposition Plan: Remains inpatient, continue CPAP nightly, continue nebulizer treatment, continue IV steroids (with ongoing tapering), continue oxygen supplementation, continue flutter valve, Pulmicort and Brovana.  INR 1.1 today, heparin drip for bridging started .  Hgb 9.5.  Consultants:   Gastroenterology service  Procedures:   See below for x-ray reports.  EGD: No signs of acute bleeding appreciated.  Colonoscopy: Plan for today 05/28/2018.  Antimicrobials:  Anti-infectives (From admission, onward)   None      Subjective: No fever, no chest pain, no nausea, no vomiting.  Patient is ready for colonoscopy today.  Objective: Vitals:   05/28/18 1245 05/28/18 1448 05/28/18 1455 05/28/18 1921  BP: 125/69  (!) 115/58   Pulse: 73  85   Resp: 17  16   Temp:   99.2 F (37.3 C)   TempSrc:   Oral   SpO2: 99% 98% 100% 98%  Weight:      Height:        Intake/Output Summary (Last 24 hours) at 05/28/2018 1930 Last data filed at 05/28/2018 1226 Gross per 24 hour  Intake 2245.94 ml  Output -  Net 2245.94 ml   Filed Weights   05/24/18 0500 05/25/18 2035 05/28/18 0600  Weight: 69.4 kg 72.7 kg 68.4 kg    Examination: General exam: Alert, awake, oriented x 3; reports breathing continue improving.  Still some shortness of breath on exertion but is now able to speak in full sentences and reports no further productive cough. Respiratory system: Expiratory wheezing on exam, no crackles, no using accessory muscles.   Cardiovascular system:RRR. No murmurs, rubs, gallops. Gastrointestinal system: Abdomen is nondistended, soft and nontender. No organomegaly or masses felt. Normal bowel sounds heard. Central nervous system: Alert and oriented. No focal neurological deficits. Extremities: No C/C/E, +pedal pulses Skin: No rashes, stage II buttocks pressure injury without signs of superimposed infection. Psychiatry: Judgement and insight appear  normal. Mood & affect appropriate.   Data Reviewed: I have personally reviewed following labs and imaging studies  CBC: Recent Labs  Lab 05/23/18 1027 05/24/18 0443 05/25/18 0441 05/27/18 0151  WBC 4.7 3.1* 6.5 7.2  NEUTROABS 3.3  --   --   --   HGB 6.6* 8.8* 8.8* 9.5*  HCT 22.3* 28.3* 28.3* 29.9*  MCV 97.0 94.6 95.3 94.9  PLT 203 207 227 448   Basic Metabolic Panel: Recent Labs  Lab 05/23/18 1027 05/24/18 0443 05/27/18 0151  NA 143 138 140  K 3.5 4.2 3.5  CL 96* 95* 103  CO2 39* 36* 31  GLUCOSE 94 132* 123*  BUN 19 17 17   CREATININE 0.60 0.63 0.70  CALCIUM 7.8* 7.9* 7.8*   GFR: Estimated Creatinine Clearance: 64 mL/min (by C-G formula based on SCr of 0.7 mg/dL).   Liver Function Tests: Recent Labs  Lab 05/24/18 0443  AST 13*  ALT 9  ALKPHOS 63  BILITOT 0.5  PROT 5.7*  ALBUMIN 2.7*   Coagulation Profile: Recent Labs  Lab 05/24/18 0826 05/25/18 0916 05/26/18 0457 05/27/18 0151 05/28/18 0556  INR 4.4* 5.2* 1.2 1.1 1.1   Cardiac Enzymes: Recent Labs  Lab 05/23/18 1027  TROPONINI <0.03   Urine analysis:    Component Value Date/Time   COLORURINE YELLOW 05/23/2018 0934   APPEARANCEUR CLEAR 05/23/2018 0934   LABSPEC 1.014 05/23/2018 0934   PHURINE 7.0 05/23/2018 0934   GLUCOSEU NEGATIVE 05/23/2018 0934   HGBUR SMALL (A) 05/23/2018 0934   BILIRUBINUR NEGATIVE 05/23/2018 0934   BILIRUBINUR n 09/21/2016 1432   KETONESUR NEGATIVE 05/23/2018 0934   PROTEINUR 30 (A) 05/23/2018 0934   UROBILINOGEN 0.2 09/21/2016 1432   UROBILINOGEN 0.2 04/13/2009 2025   NITRITE NEGATIVE  05/23/2018 [place] 05/23/2018 0934    Recent Results (from the past 240 hour(s))  Urine culture     Status: Abnormal   Collection Time: 05/23/18  9:35 AM  Result Value Ref Range Status   Specimen Description   Final    URINE, CLEAN CATCH Performed at [facility], [address], [place], [place] [address]    Special Requests   Final    NONE Performed at  [facility], [address] [place], [place] [address]    Culture MULTIPLE SPECIES PRESENT, SUGGEST RECOLLECTION (A)  Final   Report Status 05/24/2018 FINAL  Final  SARS Coronavirus 2 (CEPHEID- Performed in [place] hospital lab), Hosp Order     Status: None   Collection Time: 05/23/18 10:00 AM  Result Value Ref Range Status   SARS Coronavirus 2 NEGATIVE NEGATIVE Final    Comment: (NOTE) If result is NEGATIVE SARS-CoV-2 target nucleic acids are NOT DETECTED. The SARS-CoV-2 RNA is generally detectable in upper and lower  respiratory specimens during the acute phase of infection. The lowest  concentration of SARS-CoV-2 viral copies this assay can detect is 250  copies / mL. A negative result does not preclude SARS-CoV-2 infection  and should not be used as the sole basis for treatment or other  patient management decisions.  A negative result may occur with  improper specimen collection / handling, submission of specimen other  than nasopharyngeal swab, presence of viral mutation(s) within the  areas targeted by this assay, and inadequate number of viral copies  (<250 copies / mL). A negative result must be combined with clinical  observations, patient history, and epidemiological information. If result is POSITIVE SARS-CoV-2 target nucleic acids are DETECTED. The SARS-CoV-2 RNA is generally detectable in upper and lower  respiratory specimens dur ing the acute phase of infection.  Positive  results are indicative of active infection with SARS-CoV-2.  Clinical  correlation with patient history and other diagnostic information is  necessary to determine patient infection status.  Positive results do  not rule out bacterial infection or co-infection with other viruses. If result is PRESUMPTIVE POSTIVE SARS-CoV-2 nucleic acids MAY BE PRESENT.   A presumptive positive result was obtained on the submitted specimen  and confirmed on repeat testing.  While 2019 novel coronavirus   (SARS-CoV-2) nucleic acids may be present in the submitted sample  additional confirmatory testing may be necessary for epidemiological  and / or clinical management purposes  to differentiate between  SARS-CoV-2 and other Sarbecovirus currently known to infect humans.  If clinically indicated additional testing with an alternate test  methodology [phone]) is advised. The SARS-CoV-2 RNA is generally  detectable in upper and lower respiratory sp ecimens during the acute  phase of infection. The expected result is Negative. Fact Sheet for Patients:  StrictlyIdeas.no Fact Sheet for Healthcare Providers: BankingDealers.co.za This test is not yet approved or cleared by the [place] FDA and has been authorized for detection and/or diagnosis of SARS-CoV-2 by FDA under an Emergency Use Authorization (EUA).  This EUA will remain in effect (meaning this test can be used) for the duration of the COVID-19 declaration under Section 564(b)(1) of the Act, 21 U.S.C. section 360bbb-3(b)(1), unless the authorization is terminated or revoked sooner. Performed at [facility], [address] [place], [place] [address]   MRSA PCR Screening     Status: None   Collection Time: 05/23/18  3:03 PM  Result Value Ref Range Status   MRSA by  PCR NEGATIVE NEGATIVE Final    Comment:        The GeneXpert MRSA Assay (FDA approved for NASAL specimens only), is one component of a comprehensive MRSA colonization surveillance program. It is not intended to diagnose MRSA infection nor to guide or monitor treatment for MRSA infections. Performed at [facility], [address], [place], [place] [address]      Radiology Studies: No results found.  Scheduled Meds: . arformoterol  15 mcg Nebulization BID  . budesonide (PULMICORT) nebulizer solution  0.5 mg Nebulization BID  . buPROPion  100 mg Oral q morning - 10a  . citalopram  10 mg Oral Daily  .  dextromethorphan-guaiFENesin  1 tablet Oral BID  . feeding supplement  1 Container Oral BID BM  . feeding supplement (PRO-STAT SUGAR FREE 64)  30 mL Oral BID  . folic acid  1 mg Oral Daily  . ipratropium-albuterol  3 mL Nebulization TID  . levothyroxine  125 mcg Oral Q0600  . mouth rinse  15 mL Mouth Rinse BID  . methylPREDNISolone (SOLU-MEDROL) injection  40 mg Intravenous Q12H  . multivitamin with minerals  1 tablet Oral Daily  . nicotine  21 mg Transdermal Daily  . omega-3 acid ethyl esters  1 capsule Oral Daily  . [START ON 05/29/2018] pantoprazole  40 mg Oral Daily  . polyethylene glycol-electrolytes  4,000 mL Oral Once   Continuous Infusions: . sodium chloride Stopped (05/23/18 1538)  . sodium chloride Stopped (05/28/18 0239)     LOS: 5 days    Time spent: 30 minutes   Barton Dubois, MD [facility] Pager [phone]   05/28/2018, 7:30 PM

## 2018-05-28 NOTE — Progress Notes (Signed)
Patient still refusing to drink the bowel prep. Patient has been thoroughly educated on the purpose of the bowel prep and how her bowel movements need to be clear in order for the surgeon to adequately assess her intestines.  Pt stated "if I drink it now, it won't be clear in the morning".  Explained to patient that since she was not allowed to eat, it would indeed still be clear.  Patient still refusing to drink it at this time.

## 2018-05-28 NOTE — Progress Notes (Signed)
Patient requesting IV fluids to be stopped at this time because of increased frequency of urination and not being able to get sleep.  Explained why pt was receiving IVF.  Pt still requesting that they be stopped.  IVF stopped and Dr. Olevia Bowens notified.

## 2018-05-28 NOTE — Anesthesia Preprocedure Evaluation (Signed)
Anesthesia Evaluation    Airway Mallampati: II       Dental  (+) Dental Advidsory Given   Pulmonary shortness of breath, COPD, Current Smoker,    + rhonchi  + decreased breath sounds      Cardiovascular + CAD and + Past MI   Rhythm:regular     Neuro/Psych PSYCHIATRIC DISORDERS Depression    GI/Hepatic   Endo/Other  Hypothyroidism   Renal/GU      Musculoskeletal   Abdominal   Peds  Hematology   Anesthesia Other Findings COPD w/ ongoing tobacco abuse O2 dependent STEMI 2002, 2003, 2012 stented eval 2013 with patent stents reported  Reproductive/Obstetrics                             Anesthesia Physical Anesthesia Plan  ASA: IV  Anesthesia Plan: MAC   Post-op Pain Management:    Induction:   PONV Risk Score and Plan:   Airway Management Planned:   Additional Equipment:   Intra-op Plan:   Post-operative Plan:   Informed Consent: I have reviewed the patients History and Physical, chart, labs and discussed the procedure including the risks, benefits and alternatives for the proposed anesthesia with the patient or authorized representative who has indicated his/her understanding and acceptance.       Plan Discussed with: Anesthesiologist  Anesthesia Plan Comments:         Anesthesia Quick Evaluation

## 2018-05-28 NOTE — Anesthesia Postprocedure Evaluation (Signed)
Anesthesia Post Note  Patient: [name]  Procedure(s) Performed: COLONOSCOPY WITH PROPOFOL (N/A ) POLYPECTOMY  Patient location during evaluation: PACU Anesthesia Type: MAC Level of consciousness: awake Pain management: pain level controlled Vital Signs Assessment: post-procedure vital signs reviewed and stable Respiratory status: spontaneous breathing, nonlabored ventilation, respiratory function stable and patient connected to nasal cannula oxygen Cardiovascular status: blood pressure returned to baseline Postop Assessment: no apparent nausea or vomiting Anesthetic complications: no     Last Vitals:  Vitals:   05/28/18 1101 05/28/18 1230  BP: 136/68 132/64  Pulse:  86  Resp: 14 (P) 15  Temp: 37 C (P) 36.6 C  SpO2: 100% 100%    Last Pain:  Vitals:   05/28/18 1101  TempSrc: Oral  PainSc: 0-No pain                 Sinaya Min[place] J

## 2018-05-28 NOTE — Progress Notes (Signed)
Brief colonoscopy note:  Examination performed to cecum. 7 mm polyp cold snared from cecum and 2 clips applied to achieve hemostasis. Small polyp cold biopsy from hepatic flexure. Both polyps submitted together. Sigmoid colon diverticulosis. External hemorrhoids.

## 2018-05-28 NOTE — Progress Notes (Signed)
  Subjective:  [name] is a chemo patient has no complaints.  She says she is passing colored water.  She has refused to drink GoLYTELY.  She has not even drank one third of the prep.  She says her breathing is tight but she is receiving neb therapy.  She denies abdominal pain.   Objective: Blood pressure (!) 124/56, pulse 75, temperature 98.4 F (36.9 C), temperature source Oral, resp. rate 19, height _0  (1.626 m), weight 68.4 kg, SpO2 95 %. Patient is alert and in no acute distress. Auscultation of lungs reveal diminished intensity of breath sounds bilaterally. Cardiac exam with regular rhythm normal S1 and S2. Abdomen is soft and nontender with no organomegaly or masses.  Labs/studies Results:  CBC Latest Ref Rng & Units 05/27/2018 05/25/2018 05/24/2018  WBC 4.0 - 10.5 K/uL 7.2 6.5 3.1(L)  Hemoglobin 12.0 - 15.0 g/dL 9.5(L) 8.8(L) 8.8(L)  Hematocrit 36.0 - 46.0 % 29.9(L) 28.3(L) 28.3(L)  Platelets 150 - 400 K/uL 231 227 207    CMP Latest Ref Rng & Units 05/27/2018 05/24/2018 05/23/2018  Glucose 70 - 99 mg/dL 123(H) 132(H) 94  BUN 8 - 23 mg/dL _1 Creatinine 0.44 - 1.00 mg/dL 0.70 0.63 0.60  Sodium 135 - 145 mmol/L 140 138 143  Potassium 3.5 - 5.1 mmol/L 3.5 4.2 3.5  Chloride 98 - 111 mmol/L 103 95(L) 96(L)  CO2 22 - 32 mmol/L 31 36(H) 39(H)  Calcium 8.9 - 10.3 mg/dL 7.8(L) 7.9(L) 7.8(L)  Total Protein 6.5 - 8.1 g/dL - 5.7(L) -  Total Bilirubin 0.3 - 1.2 mg/dL - 0.5 -  Alkaline Phos 38 - 126 U/L - 63 -  AST 15 - 41 U/L - 13(L) -  ALT 0 - 44 U/L - 9 -    Hepatic Function Latest Ref Rng & Units 05/24/2018 01/31/2018 01/28/2018  Total Protein 6.5 - 8.1 g/dL 5.7(L) - 6.8  Albumin 3.5 - 5.0 g/dL 2.7(L) 2.3(L) 2.8(L)  AST 15 - 41 U/L 13(L) - 31  ALT 0 - 44 U/L 9 - 37  Alk Phosphatase 38 - 126 U/L 63 - 94  Total Bilirubin 0.3 - 1.2 mg/dL 0.5 - 0.3  Bilirubin, Direct 0.0 - 0.3 mg/dL - - -    INR is 1.1.  Assessment:  #1. GI bleed.  Patient presented with melena and anemia.   She is on warfarin and her INR was supratherapeutic.  Was corrected with vitamin K.  She underwent 2 days ago and no bleeding lesion was found.  She was scheduled to undergo colonoscopy yesterday but she did not finish the prep and heparin was not stopped.  She will undergo colonoscopy later today.  #2.  Anemia secondary to GI bleed.  Patient has received 2 units of PRBCs and her hemoglobin yesterday was 9.5 g.  #3.  Factor V Leiden deficiency.  Heparin has been held so that colonoscopy could be performed.  #4.  COPD.  Patient is on O2.   Plan:  Diagnostic colonoscopy later today under monitored anesthesia care.

## 2018-05-29 ENCOUNTER — Encounter (HOSPITAL_COMMUNITY): Payer: Self-pay | Admitting: Internal Medicine

## 2018-05-29 ENCOUNTER — Telehealth (INDEPENDENT_AMBULATORY_CARE_PROVIDER_SITE_OTHER): Payer: Self-pay | Admitting: *Deleted

## 2018-05-29 LAB — CBC
HCT: 28.4 % — ABNORMAL LOW (ref 36.0–46.0)
Hemoglobin: 8.9 g/dL — ABNORMAL LOW (ref 12.0–15.0)
MCH: 30.1 pg (ref 26.0–34.0)
MCHC: 31.3 g/dL (ref 30.0–36.0)
MCV: 95.9 fL (ref 80.0–100.0)
Platelets: 197 10*3/uL (ref 150–400)
RBC: 2.96 MIL/uL — ABNORMAL LOW (ref 3.87–5.11)
RDW: 15.6 % — ABNORMAL HIGH (ref 11.5–15.5)
WBC: 6.3 10*3/uL (ref 4.0–10.5)
nRBC: 0 % (ref 0.0–0.2)

## 2018-05-29 LAB — PROTIME-INR
INR: 1.1 (ref 0.8–1.2)
Prothrombin Time: 14.5 seconds (ref 11.4–15.2)

## 2018-05-29 MED ORDER — ENOXAPARIN SODIUM 80 MG/0.8ML ~~LOC~~ SOLN: 100.0000 mg | Freq: Two times a day (BID) | SUBCUTANEOUS | 0 refills | Status: DC

## 2018-05-29 MED ORDER — ENOXAPARIN SODIUM 100 MG/ML ~~LOC~~ SOLN
100.0000 mg | SUBCUTANEOUS | Status: DC
  Administered 2018-05-29: 100 mg via SUBCUTANEOUS
  Filled 2018-05-29: qty 1

## 2018-05-29 MED ORDER — WARFARIN - PHARMACIST DOSING INPATIENT
Freq: Every day | Status: DC
  Administered 2018-05-29: 17:00:00

## 2018-05-29 MED ORDER — ENOXAPARIN SODIUM 80 MG/0.8ML ~~LOC~~ SOLN: 100.0000 mg | SUBCUTANEOUS | 0 refills | Status: DC

## 2018-05-29 MED ORDER — WARFARIN SODIUM 5 MG PO TABS
6.0000 mg | ORAL_TABLET | Freq: Once | ORAL | Status: AC
  Administered 2018-05-29: 6 mg via ORAL
  Filled 2018-05-29: qty 1

## 2018-05-29 MED ORDER — BISACODYL 5 MG PO TBEC: 5.0000 mg | DELAYED_RELEASE_TABLET | Freq: Every day | ORAL | 0 refills | Status: DC | PRN

## 2018-05-29 NOTE — Progress Notes (Signed)
Subjective:  Patient has no complaints.  She has not had a bowel movement since colonoscopy yesterday.  She refuses to go to a nursing home.  She wants to go back to her own home.  Objective: Blood pressure 116/63, pulse 81, temperature 99.2 F (37.3 C), temperature source Oral, resp. rate 17, height 5' 4" (1.626 m), weight 70.1 kg, SpO2 99 %. Patient is alert and in no acute distress but she is somewhat upset. She is talking with the social worker over the phone. Abdomen is soft and nontender.    Labs/studies Results:  CBC Latest Ref Rng & Units 05/29/2018 05/27/2018 05/25/2018  WBC 4.0 - 10.5 K/uL 6.3 7.2 6.5  Hemoglobin 12.0 - 15.0 g/dL 8.9(L) 9.5(L) 8.8(L)  Hematocrit 36.0 - 46.0 % 28.4(L) 29.9(L) 28.3(L)  Platelets 150 - 400 K/uL 197 231 227    CMP Latest Ref Rng & Units 05/27/2018 05/24/2018 05/23/2018  Glucose 70 - 99 mg/dL 123(H) 132(H) 94  BUN 8 - 23 mg/dL 17 17 19  Creatinine 0.44 - 1.00 mg/dL 0.70 0.63 0.60  Sodium 135 - 145 mmol/L 140 138 143  Potassium 3.5 - 5.1 mmol/L 3.5 4.2 3.5  Chloride 98 - 111 mmol/L 103 95(L) 96(L)  CO2 22 - 32 mmol/L 31 36(H) 39(H)  Calcium 8.9 - 10.3 mg/dL 7.8(L) 7.9(L) 7.8(L)  Total Protein 6.5 - 8.1 g/dL - 5.7(L) -  Total Bilirubin 0.3 - 1.2 mg/dL - 0.5 -  Alkaline Phos 38 - 126 U/L - 63 -  AST 15 - 41 U/L - 13(L) -  ALT 0 - 44 U/L - 9 -    Hepatic Function Latest Ref Rng & Units 05/24/2018 01/31/2018 01/28/2018  Total Protein 6.5 - 8.1 g/dL 5.7(L) - 6.8  Albumin 3.5 - 5.0 g/dL 2.7(L) 2.3(L) 2.8(L)  AST 15 - 41 U/L 13(L) - 31  ALT 0 - 44 U/L 9 - 37  Alk Phosphatase 38 - 126 U/L 63 - 94  Total Bilirubin 0.3 - 1.2 mg/dL 0.5 - 0.3  Bilirubin, Direct 0.0 - 0.3 mg/dL - - -    Both colonic polyps are tubular adenomas.  Assessment:  #1.  GI bleed.  Patient presented with melena in the setting of anticoagulation with supratherapeutic INR which is corrected with vitamin K.  EGD did not reveal any source of bleeding.  She had colonoscopy  yesterday with removal of cecal and hepatic flexure polyps and these are tubular adenomas.  She did have sigmoid diverticulosis which could have been the potential source of her GI bleed although she did not have bright red blood per rectum.  She may have source in small bowel.  She is not bleeding anymore.  Need for further testing will depend on how she does over the next few weeks.  Please note that she has been on warfarin for a number of years and never bled until this time and her INR was supratherapeutic.  Hopefully INR can be maintained in therapeutic range and maybe she will not have any more bleeding.  #2.  Anemia secondary to GI bleed.  Patient has received 2 units of PRBCs.  #3.  Anticoagulation status.  Patient has factor V Leiden deficiency and history of pulmonary embolism.  She is back on warfarin and she is to be bridged with Lovenox if she goes home. .  Recommendations:  We will plan to see in the office in 3 weeks.  She will have CBC prior to that visit. But if she returns with another   episode of overt GI bleed with proceed with small bowel given capsule study.  Discussed with Dr. Manuella Ghazi.
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
## 2018-05-29 NOTE — Progress Notes (Signed)
Nsg Discharge Note  Admit Date:  05/23/2018 Discharge date: 05/29/2018   [name] to be D/C'd Home per MD order.  AVS completed.  Copy for chart, and copy for patient signed, and dated. Patient able to verbalize understanding.  Discharge Medication: Allergies as of 05/29/2018      Reactions   Dilaudid [hydromorphone Hcl] Hives, Nausea Only   Minocycline Hcl    REACTION: Dizzy   Prednisone    REACTION: feels like throat swelling, hallucinations   Varenicline Tartrate    REACTION: Dizzy(chantix)   Zocor [simvastatin - High Dose] Other (See Comments)   myalgia      Medication List    TAKE these medications   albuterol (2.5 MG/3ML) 0.083% nebulizer solution Commonly known as:  PROVENTIL USE 1 VIAL BY NEBULIZER EVERY 4 HOURS AS NEEDED FOR WHEEZING. DX: J44.9 What changed:    how much to take  how to take this  when to take this  reasons to take this  additional instructions   arformoterol 15 MCG/2ML Nebu Commonly known as:  BROVANA Take 2 mLs (15 mcg total) by nebulization 2 (two) times daily.   baclofen 10 MG tablet Commonly known as:  LIORESAL Take 1 tablet (10 mg total) by mouth 3 (three) times daily.   bisacodyl 5 MG EC tablet Commonly known as:  DULCOLAX Take 1 tablet (5 mg total) by mouth daily as needed for mild constipation.   budesonide 0.5 MG/2ML nebulizer solution Commonly known as:  Pulmicort Take 2 mLs (0.5 mg total) by nebulization 2 (two) times daily. Dx: J43.9   buPROPion 100 MG 12 hr tablet Commonly known as:  WELLBUTRIN SR Take 100 mg by mouth every morning.   citalopram 10 MG tablet Commonly known as:  CELEXA Take 1 tablet by mouth daily.   enoxaparin 80 MG/0.8ML injection Commonly known as:  LOVENOX Inject 1 mL (100 mg total) into the skin daily.   fentaNYL 50 MCG/HR Commonly known as:  [place] 1 patch onto the skin every 3 (three) days.   ferrous sulfate 324 (65 Fe) MG Tbec Take 1 tablet (325 mg total) by mouth daily  with breakfast.   Fish Oil 600 MG Caps Take 1 capsule by mouth daily.   Flutter Devi Use as directed   folic acid 1 MG tablet Commonly known as:  FOLVITE Take 1 tablet (1 mg total) by mouth daily.   HYDROcodone-acetaminophen 5-325 MG tablet Commonly known as:  NORCO/VICODIN Take 1 tablet by mouth every 6 (six) hours as needed (BREAKTHROUGH PAIN).   ipratropium-albuterol 0.5-2.5 (3) MG/3ML Soln Commonly known as:  DUONEB Take 3 mLs by nebulization 4 (four) times daily.   levothyroxine 125 MCG tablet Commonly known as:  SYNTHROID Take 1 tablet (125 mcg total) by mouth daily.   lidocaine 5 % Commonly known as:  LIDODERM Place 1 patch onto the skin daily. Remove & Discard patch within 12 hours or as directed by MD   nitroGLYCERIN 0.4 MG SL tablet Commonly known as:  NITROSTAT Place 1 tablet (0.4 mg total) under the tongue every 5 (five) minutes as needed for chest pain.   pantoprazole 40 MG tablet Commonly known as:  PROTONIX Take 1 tablet (40 mg total) by mouth daily.   polyethylene glycol 17 g packet Commonly known as:  MIRALAX / GLYCOLAX Take 17 g by mouth daily.   ticagrelor 60 MG Tabs tablet Commonly known as:  Brilinta Take 1 tablet (60 mg total) by mouth 2 (two) times daily.  traZODone 50 MG tablet Commonly known as:  DESYREL Take 50 mg by mouth at bedtime as needed for sleep.   warfarin 1 MG tablet Commonly known as:  COUMADIN Take as directed. If you are unsure how to take this medication, talk to your nurse or doctor. Original instructions:  Take 1 mg by mouth See admin instructions. Alternates taking 6mg  with 5mg  daily. Takes 5mg  on Mondays, Wednesdays, and Fridays. Takes 6mg  on all other days What changed:  Another medication with the same name was removed. Continue taking this medication, and follow the directions you see here.            Durable Medical Equipment  (From admission, onward)         Start     Ordered   05/29/18 1207  DME 3-in-1   Once    Comments:  LON=99 months.   05/29/18 1207          Discharge Assessment: Vitals:   05/29/18 0750 05/29/18 1403  BP:    Pulse:    Resp:    Temp:    SpO2: 99% 97%   Skin clean, dry and intact without evidence of skin break down, no evidence of skin tears noted. IV catheter discontinued intact. Site without signs and symptoms of complications - no redness or edema noted at insertion site, patient denies c/o pain - only slight tenderness at site.  Dressing with slight pressure applied.  D/c Instructions-Education: Discharge instructions given to patient with verbalized understanding. D/c education completed with patient including follow up instructions, medication list, d/c activities limitations if indicated, with other d/c instructions as indicated by MD - patient able to verbalize understanding, all questions fully answered. Patient instructed to return to ED, call 911, or call MD for any changes in condition.  Patient transported to home via [place], RN 05/29/2018 6:22 PM

## 2018-05-29 NOTE — Care Management Important Message (Signed)
Important Message  Patient Details  Name: [name] MRN: [id number] Date of Birth: 1950/01/30   Medicare Important Message Given:  Yes    [name] 05/29/2018, 11:18 AM

## 2018-05-29 NOTE — Telephone Encounter (Signed)
Patient is currently in the hospital . Per Dr.Rehman the patient will need to have a OV in 3 weeks , and she will need lab work prior to that.  Mitzie , please let me know so that I can arrange the lab work to be done.

## 2018-05-29 NOTE — Discharge Summary (Signed)
Physician Discharge Summary  [name] ZJQ:[id number] DOB: May 14, 1950 DOA: 05/23/2018  PCP: [name], NP  Admit date: 05/23/2018  Discharge date: 05/29/2018  Admitted From:Home  Disposition:  Home  Recommendations for Outpatient Follow-up:  1. Follow up with PCP in 3-5 days with repeat INR and labs 2. Please continue to follow-up INR while on Lovenox/Coumadin bridging 3. GI Dr. Laural Golden to follow-up in 3 weeks outpatient and will consider capsule endoscopy at that time if needed 4. Follow-up BMP and CBC in 1 week  Home Health:Yes  Equipment/Devices:3n1  Discharge Condition:Stable  CODE STATUS: Full  Diet recommendation: Heart Healthy  Brief/Interim Summary: Per HPI: 68 y.o.femalewith past medical history significant for arteriosclerotic cardiovascular disease, factor V deficiency, chronic respiratory failure (due to COPD, using 2 L nasal cannula supplementation), ongoing tobacco abuse, hypothyroidism, history of pulmonary embolism and DVT;who presented to the emergency department secondary to increased shortness of breath and fatigue. Patient reports symptom has been present for the last 3 to 4 days and worsening. She has used home nebulizer without significant improvement. Patient continues to smoke. Patient reported intermittent episode of productive cough and also expressed positive/worsening wheezing.Patient reports intermittent episode of melanotic stools and ongoing sensation of feeling weak and tired at all times.  Patient denies fever, chills, nauseaandvomiting; there has not beenabdominal pain, no headaches, no chest pain, no diaphoresis, no focal weakness, no dysuria or hematuria. In the ED chest x-ray demonstrated chronic bronchitic/emphysematous changes without acute infiltrates.COVID-19 test neg. No acute ischemic changes on EKG, neg troponin and normal BNP. Hgb down to 6.6 and positiveFOBT. ABG with compensated hypercapnia.She received fluid  resuscitation, type and cross with order to initiate transfusion, nebulizer treatment and started on BiPAP to assist with her shortness of breath and hypercapnia. [facility] has been called to admit patient for further evaluation and management of COPD exacerbation and GI bleed.  Patient was admitted for acute on chronic respiratory failure in the setting of COPD exacerbation.  She has improved on IV steroids as well as breathing treatments and has home breathing treatments to continue.  She refuses prednisone at this time due to prior issues with hallucinations.  PT has assessed patient and recommended SNF placement, but she has refused this as well and would like to go home.  She will be set up with home health PT, RN, home health aide, and LCSW.  She will also be given 3 in 1 to assist her at home.  During her stay, she was also noted to have acute blood loss anemia with melanotic stool and GI was consulted for evaluation.  She underwent EGD with no significant findings noted to explain bleed.  She underwent colonoscopy as well subsequently on 5/19 with small polyp that was biopsied as well as some findings of hemorrhoids and diverticulosis that was likely the questionable source of bleeding.  She required 2 unit of PRBC transfusion and has stable hemoglobin levels and is stable for discharge from the standpoint.  Her Coumadin was withheld during this time due to the bleeding and she will not be discharged on Lovenox bridging outpatient to follow-up with INR levels with her PCP.  She will also have repeat CBC and further work-up per GI in the next 3 weeks.  No other acute events noted during the course of this admission.  She is otherwise stable for discharge.  Discharge Diagnoses:  Principal Problem:   Acute on chronic respiratory failure (HCC) Active Problems:   Hypothyroidism   TOBACCO ABUSE   COPD with  exacerbation (HCC)   Melena   Factor V deficiency (HCC)   Depression   Chronic pain syndrome    Hypercapnia   Malnutrition of moderate degree    Discharge Instructions  Discharge Instructions    Diet - low sodium heart healthy   Complete by:  As directed    Increase activity slowly   Complete by:  As directed      Allergies as of 05/29/2018      Reactions   Dilaudid [hydromorphone Hcl] Hives, Nausea Only   Minocycline Hcl    REACTION: Dizzy   Prednisone    REACTION: feels like throat swelling, hallucinations   Varenicline Tartrate    REACTION: Dizzy(chantix)   Zocor [simvastatin - High Dose] Other (See Comments)   myalgia      Medication List    TAKE these medications   albuterol (2.5 MG/3ML) 0.083% nebulizer solution Commonly known as:  PROVENTIL USE 1 VIAL BY NEBULIZER EVERY 4 HOURS AS NEEDED FOR WHEEZING. DX: J44.9 What changed:    how much to take  how to take this  when to take this  reasons to take this  additional instructions   arformoterol 15 MCG/2ML Nebu Commonly known as:  BROVANA Take 2 mLs (15 mcg total) by nebulization 2 (two) times daily.   baclofen 10 MG tablet Commonly known as:  LIORESAL Take 1 tablet (10 mg total) by mouth 3 (three) times daily.   bisacodyl 5 MG EC tablet Commonly known as:  DULCOLAX Take 1 tablet (5 mg total) by mouth daily as needed for mild constipation.   budesonide 0.5 MG/2ML nebulizer solution Commonly known as:  Pulmicort Take 2 mLs (0.5 mg total) by nebulization 2 (two) times daily. Dx: J43.9   buPROPion 100 MG 12 hr tablet Commonly known as:  WELLBUTRIN SR Take 100 mg by mouth every morning.   citalopram 10 MG tablet Commonly known as:  CELEXA Take 1 tablet by mouth daily.   enoxaparin 80 MG/0.8ML injection Commonly known as:  LOVENOX Inject 1 mL (100 mg total) into the skin 2 (two) times daily.   fentaNYL 50 MCG/HR Commonly known as:  Morris 1 patch onto the skin every 3 (three) days.   ferrous sulfate 324 (65 Fe) MG Tbec Take 1 tablet (325 mg total) by mouth daily with  breakfast.   Fish Oil 600 MG Caps Take 1 capsule by mouth daily.   Flutter Devi Use as directed   folic acid 1 MG tablet Commonly known as:  FOLVITE Take 1 tablet (1 mg total) by mouth daily.   HYDROcodone-acetaminophen 5-325 MG tablet Commonly known as:  NORCO/VICODIN Take 1 tablet by mouth every 6 (six) hours as needed (BREAKTHROUGH PAIN).   ipratropium-albuterol 0.5-2.5 (3) MG/3ML Soln Commonly known as:  DUONEB Take 3 mLs by nebulization 4 (four) times daily.   levothyroxine 125 MCG tablet Commonly known as:  SYNTHROID Take 1 tablet (125 mcg total) by mouth daily.   lidocaine 5 % Commonly known as:  LIDODERM Place 1 patch onto the skin daily. Remove & Discard patch within 12 hours or as directed by MD   nitroGLYCERIN 0.4 MG SL tablet Commonly known as:  NITROSTAT Place 1 tablet (0.4 mg total) under the tongue every 5 (five) minutes as needed for chest pain.   pantoprazole 40 MG tablet Commonly known as:  PROTONIX Take 1 tablet (40 mg total) by mouth daily.   polyethylene glycol 17 g packet Commonly known as:  MIRALAX / GLYCOLAX Take  17 g by mouth daily.   ticagrelor 60 MG Tabs tablet Commonly known as:  Brilinta Take 1 tablet (60 mg total) by mouth 2 (two) times daily.   traZODone 50 MG tablet Commonly known as:  DESYREL Take 50 mg by mouth at bedtime as needed for sleep.   warfarin 1 MG tablet Commonly known as:  COUMADIN Take as directed. If you are unsure how to take this medication, talk to your nurse or doctor. Original instructions:  Take 1 mg by mouth See admin instructions. Alternates taking 6mg  with 5mg  daily. Takes 5mg  on Mondays, Wednesdays, and Fridays. Takes 6mg  on all other days What changed:  Another medication with the same name was removed. Continue taking this medication, and follow the directions you see here.            Durable Medical Equipment  (From admission, onward)         Start     Ordered   05/29/18 1207  DME 3-in-1   Once    Comments:  LON=99 months.   05/29/18 1207         Follow-up Information    Nafziger, Tommi Rumps, NP Follow up.   Specialty:  Family Medicine Contact information: [place] [address] [phone]        Herminio Commons, MD .   Specialty:  Cardiology Contact information: [place] [address] [phone]          Allergies  Allergen Reactions  . Dilaudid [Hydromorphone Hcl] Hives and Nausea Only  . Minocycline Hcl     REACTION: Dizzy  . Prednisone     REACTION: feels like throat swelling, hallucinations  . Varenicline Tartrate     REACTION: Dizzy(chantix)   . Zocor [Simvastatin - High Dose] Other (See Comments)    myalgia    Consultations:  GI   Procedures/Studies: Dg Chest Portable 1 View  Result Date: 05/23/2018 CLINICAL DATA:  Shortness of breath EXAM: PORTABLE CHEST 1 VIEW COMPARISON:  February 15, 2018 chest radiograph; chest CT November 14, 2017. FINDINGS: There is mild scarring in the left base. There remains interstitial thickening bilaterally. There is no frank edema or consolidation. Heart is slightly enlarged with pulmonary vascularity normal. No adenopathy. No bone lesions. IMPRESSION: Scarring left base. Interstitial thickening, stable. Underlying emphysematous change better seen on prior CT. No edema or consolidation. Stable cardiac prominence. Emphysema (ICD10-J43.9). Electronically Signed   By: Lowella Grip III M.D.   On: 05/23/2018 09:47   Discharge Exam: Vitals:   05/29/18 0738 05/29/18 0750  BP:    Pulse:    Resp:    Temp:    SpO2: 99% 99%   Vitals:   05/29/18 0523 05/29/18 0733 05/29/18 0738 05/29/18 0750  BP: 116/63     Pulse: 81     Resp: 17     Temp: 99.2 F (37.3 C)     TempSrc: Oral     SpO2: 95% 99% 99% 99%  Weight: 70.1 kg     Height:        General: Pt is alert, awake, not in acute distress Cardiovascular: RRR, S1/S2 +, no rubs, no gallops Respiratory: CTA  bilaterally, no wheezing, no rhonchi; 3L [place]-wears chronically at home Abdominal: Soft, NT, ND, bowel sounds + Extremities: no edema, no cyanosis    The results of significant diagnostics from this hospitalization (including imaging, microbiology, ancillary and laboratory) are listed below for reference.     Microbiology: Recent Results (  from the past 240 hour(s))  Urine culture     Status: Abnormal   Collection Time: 05/23/18  9:35 AM  Result Value Ref Range Status   Specimen Description   Final    URINE, CLEAN CATCH Performed at [facility], [address], [place], [place] [address]    Special Requests   Final    NONE Performed at [facility], [address] [place], [place] [address]    Culture MULTIPLE SPECIES PRESENT, SUGGEST RECOLLECTION (A)  Final   Report Status 05/24/2018 FINAL  Final  SARS Coronavirus 2 (CEPHEID- Performed in [place] hospital lab), Hosp Order     Status: None   Collection Time: 05/23/18 10:00 AM  Result Value Ref Range Status   SARS Coronavirus 2 NEGATIVE NEGATIVE Final    Comment: (NOTE) If result is NEGATIVE SARS-CoV-2 target nucleic acids are NOT DETECTED. The SARS-CoV-2 RNA is generally detectable in upper and lower  respiratory specimens during the acute phase of infection. The lowest  concentration of SARS-CoV-2 viral copies this assay can detect is 250  copies / mL. A negative result does not preclude SARS-CoV-2 infection  and should not be used as the sole basis for treatment or other  patient management decisions.  A negative result may occur with  improper specimen collection / handling, submission of specimen other  than nasopharyngeal swab, presence of viral mutation(s) within the  areas targeted by this assay, and inadequate number of viral copies  (<250 copies / mL). A negative result must be combined with clinical  observations, patient history, and epidemiological information. If result is POSITIVE SARS-CoV-2 target nucleic  acids are DETECTED. The SARS-CoV-2 RNA is generally detectable in upper and lower  respiratory specimens dur ing the acute phase of infection.  Positive  results are indicative of active infection with SARS-CoV-2.  Clinical  correlation with patient history and other diagnostic information is  necessary to determine patient infection status.  Positive results do  not rule out bacterial infection or co-infection with other viruses. If result is PRESUMPTIVE POSTIVE SARS-CoV-2 nucleic acids MAY BE PRESENT.   A presumptive positive result was obtained on the submitted specimen  and confirmed on repeat testing.  While 2019 novel coronavirus  (SARS-CoV-2) nucleic acids may be present in the submitted sample  additional confirmatory testing may be necessary for epidemiological  and / or clinical management purposes  to differentiate between  SARS-CoV-2 and other Sarbecovirus currently known to infect humans.  If clinically indicated additional testing with an alternate test  methodology [phone]) is advised. The SARS-CoV-2 RNA is generally  detectable in upper and lower respiratory sp ecimens during the acute  phase of infection. The expected result is Negative. Fact Sheet for Patients:  StrictlyIdeas.no Fact Sheet for Healthcare Providers: BankingDealers.co.za This test is not yet approved or cleared by the [place] FDA and has been authorized for detection and/or diagnosis of SARS-CoV-2 by FDA under an Emergency Use Authorization (EUA).  This EUA will remain in effect (meaning this test can be used) for the duration of the COVID-19 declaration under Section 564(b)(1) of the Act, 21 U.S.C. section 360bbb-3(b)(1), unless the authorization is terminated or revoked sooner. Performed at [facility], [address], [place], [place] [address]   MRSA PCR Screening     Status: None   Collection Time: 05/23/18  3:03 PM  Result Value Ref Range  Status   MRSA by PCR NEGATIVE NEGATIVE Final    Comment:  The GeneXpert MRSA Assay (FDA approved for NASAL specimens only), is one component of a comprehensive MRSA colonization surveillance program. It is not intended to diagnose MRSA infection nor to guide or monitor treatment for MRSA infections. Performed at [facility], [address] [place], [place] [address]      Labs: BNP (last 3 results) Recent Labs    01/28/18 2330 05/23/18 1027  BNP 161.0* 88.4   Basic Metabolic Panel: Recent Labs  Lab 05/23/18 1027 05/24/18 0443 05/27/18 0151  NA 143 138 140  K 3.5 4.2 3.5  CL 96* 95* 103  CO2 39* 36* 31  GLUCOSE 94 132* 123*  BUN 19 17 17   CREATININE 0.60 0.63 0.70  CALCIUM 7.8* 7.9* 7.8*   Liver Function Tests: Recent Labs  Lab 05/24/18 0443  AST 13*  ALT 9  ALKPHOS 63  BILITOT 0.5  PROT 5.7*  ALBUMIN 2.7*   No results for input(s): LIPASE, AMYLASE in the last 168 hours. No results for input(s): AMMONIA in the last 168 hours. CBC: Recent Labs  Lab 05/23/18 1027 05/24/18 0443 05/25/18 0441 05/27/18 0151 05/29/18 0613  WBC 4.7 3.1* 6.5 7.2 6.3  NEUTROABS 3.3  --   --   --   --   HGB 6.6* 8.8* 8.8* 9.5* 8.9*  HCT 22.3* 28.3* 28.3* 29.9* 28.4*  MCV 97.0 94.6 95.3 94.9 95.9  PLT 203 207 227 231 197   Cardiac Enzymes: Recent Labs  Lab 05/23/18 1027  TROPONINI <0.03   BNP: Invalid input(s): POCBNP CBG: No results for input(s): GLUCAP in the last 168 hours. D-Dimer No results for input(s): DDIMER in the last 72 hours. Hgb A1c No results for input(s): HGBA1C in the last 72 hours. Lipid Profile No results for input(s): CHOL, HDL, LDLCALC, TRIG, CHOLHDL, LDLDIRECT in the last 72 hours. Thyroid function studies No results for input(s): TSH, T4TOTAL, T3FREE, THYROIDAB in the last 72 hours.  Invalid input(s): FREET3 Anemia work up No results for input(s): VITAMINB12, FOLATE, FERRITIN, TIBC, IRON, RETICCTPCT in the last 72  hours. Urinalysis    Component Value Date/Time   COLORURINE YELLOW 05/23/2018 [place] 05/23/2018 0934   LABSPEC 1.014 05/23/2018 [place] 7.0 05/23/2018 0934   GLUCOSEU NEGATIVE 05/23/2018 0934   HGBUR SMALL (A) 05/23/2018 0934   BILIRUBINUR NEGATIVE 05/23/2018 0934   BILIRUBINUR n 09/21/2016 1432   KETONESUR NEGATIVE 05/23/2018 0934   PROTEINUR 30 (A) 05/23/2018 0934   UROBILINOGEN 0.2 09/21/2016 1432   UROBILINOGEN 0.2 04/13/2009 2025   NITRITE NEGATIVE 05/23/2018 0934   LEUKOCYTESUR NEGATIVE 05/23/2018 0934   Sepsis Labs Invalid input(s): PROCALCITONIN,  WBC,  LACTICIDVEN Microbiology Recent Results (from the past 240 hour(s))  Urine culture     Status: Abnormal   Collection Time: 05/23/18  9:35 AM  Result Value Ref Range Status   Specimen Description   Final    URINE, CLEAN CATCH Performed at [facility], [address] [place], [place] [address]    Special Requests   Final    NONE Performed at [facility], [address], [place], [place] [address]    Culture MULTIPLE SPECIES PRESENT, SUGGEST RECOLLECTION (A)  Final   Report Status 05/24/2018 FINAL  Final  SARS Coronavirus 2 (CEPHEID- Performed in [place] hospital lab), Hosp Order     Status: None   Collection Time: 05/23/18 10:00 AM  Result Value Ref Range Status   SARS Coronavirus 2 NEGATIVE NEGATIVE Final    Comment: (NOTE) If  result is NEGATIVE SARS-CoV-2 target nucleic acids are NOT DETECTED. The SARS-CoV-2 RNA is generally detectable in upper and lower  respiratory specimens during the acute phase of infection. The lowest  concentration of SARS-CoV-2 viral copies this assay can detect is 250  copies / mL. A negative result does not preclude SARS-CoV-2 infection  and should not be used as the sole basis for treatment or other  patient management decisions.  A negative result may occur with  improper specimen collection / handling, submission of specimen other  than  nasopharyngeal swab, presence of viral mutation(s) within the  areas targeted by this assay, and inadequate number of viral copies  (<250 copies / mL). A negative result must be combined with clinical  observations, patient history, and epidemiological information. If result is POSITIVE SARS-CoV-2 target nucleic acids are DETECTED. The SARS-CoV-2 RNA is generally detectable in upper and lower  respiratory specimens dur ing the acute phase of infection.  Positive  results are indicative of active infection with SARS-CoV-2.  Clinical  correlation with patient history and other diagnostic information is  necessary to determine patient infection status.  Positive results do  not rule out bacterial infection or co-infection with other viruses. If result is PRESUMPTIVE POSTIVE SARS-CoV-2 nucleic acids MAY BE PRESENT.   A presumptive positive result was obtained on the submitted specimen  and confirmed on repeat testing.  While 2019 novel coronavirus  (SARS-CoV-2) nucleic acids may be present in the submitted sample  additional confirmatory testing may be necessary for epidemiological  and / or clinical management purposes  to differentiate between  SARS-CoV-2 and other Sarbecovirus currently known to infect humans.  If clinically indicated additional testing with an alternate test  methodology [phone]) is advised. The SARS-CoV-2 RNA is generally  detectable in upper and lower respiratory sp ecimens during the acute  phase of infection. The expected result is Negative. Fact Sheet for Patients:  StrictlyIdeas.no Fact Sheet for Healthcare Providers: BankingDealers.co.za This test is not yet approved or cleared by the [place] FDA and has been authorized for detection and/or diagnosis of SARS-CoV-2 by FDA under an Emergency Use Authorization (EUA).  This EUA will remain in effect (meaning this test can be used) for the duration of  the COVID-19 declaration under Section 564(b)(1) of the Act, 21 U.S.C. section 360bbb-3(b)(1), unless the authorization is terminated or revoked sooner. Performed at [facility], [address] [place], [place] [address]   MRSA PCR Screening     Status: None   Collection Time: 05/23/18  3:03 PM  Result Value Ref Range Status   MRSA by PCR NEGATIVE NEGATIVE Final    Comment:        The GeneXpert MRSA Assay (FDA approved for NASAL specimens only), is one component of a comprehensive MRSA colonization surveillance program. It is not intended to diagnose MRSA infection nor to guide or monitor treatment for MRSA infections. Performed at [facility], [address] [place],  [address]      Time coordinating discharge: 35 minutes  SIGNED:   Rodena Goldmann, DO [facility] 05/29/2018, 12:13 PM  If 7PM-7AM, please contact night-coverage [email or web address] Password [email or web address]

## 2018-05-29 NOTE — Progress Notes (Signed)
OT Cancellation Note  Patient Details Name: [name] MRN: [id number] DOB: [birth year]   Cancelled Treatment:    Reason Eval/Treat Not Completed: Patient declined, no reason specified. Will re-attempt treatment at a later time as able.   Ailene Ravel, OTR/L,CBIS  [phone]  05/29/2018, 9:03 AM

## 2018-05-29 NOTE — Clinical Social Work Note (Signed)
[name], friend, indicated that she would pick patient's medications up for her. She stated that patient really needs to go to SNF. LCSW discussed that patient has a right to self determination. She stated that patient's home is unclean and in need of repairs.   LCSW spoke with patient's daughter, [name], and advised of discharge. She stated that patient should not have been given a choice to go home. LCSW advised that patient had to be given her options. She stated that patient she has not transportation and has to borrow vehicles when he comes from [place] to visit patient. She did indicate that she speaks with patient via phone frequently.       [name], [name], LCSW

## 2018-05-29 NOTE — Progress Notes (Signed)
PT Cancellation Note  Patient Details Name: Scarlet Abad MRN: [id number] DOB: [birth year]   Cancelled Treatment:    Reason Eval/Treat Not Completed: Patient declined, no reason specified Multiple attempts for PT session were declined by pt.  Reasons varied from too tired initially from difficulty sleeping last night, wanting to be clean by nurse prior therapy then declined due to DC to home.    [address], LPTA; Garner Aldona Lento 05/29/2018, 4:03 PM

## 2018-05-30 ENCOUNTER — Telehealth: Payer: Self-pay | Admitting: *Deleted

## 2018-05-30 ENCOUNTER — Telehealth: Payer: Self-pay | Admitting: Adult Health

## 2018-05-30 DIAGNOSIS — J441 Chronic obstructive pulmonary disease with (acute) exacerbation: Secondary | ICD-10-CM | POA: Diagnosis not present

## 2018-05-30 DIAGNOSIS — Z86718 Personal history of other venous thrombosis and embolism: Secondary | ICD-10-CM | POA: Diagnosis not present

## 2018-05-30 DIAGNOSIS — K5791 Diverticulosis of intestine, part unspecified, without perforation or abscess with bleeding: Secondary | ICD-10-CM | POA: Diagnosis not present

## 2018-05-30 DIAGNOSIS — E440 Moderate protein-calorie malnutrition: Secondary | ICD-10-CM | POA: Diagnosis not present

## 2018-05-30 DIAGNOSIS — Z9981 Dependence on supplemental oxygen: Secondary | ICD-10-CM | POA: Diagnosis not present

## 2018-05-30 DIAGNOSIS — I2510 Atherosclerotic heart disease of native coronary artery without angina pectoris: Secondary | ICD-10-CM | POA: Diagnosis not present

## 2018-05-30 DIAGNOSIS — J9620 Acute and chronic respiratory failure, unspecified whether with hypoxia or hypercapnia: Secondary | ICD-10-CM | POA: Diagnosis not present

## 2018-05-30 DIAGNOSIS — F17219 Nicotine dependence, cigarettes, with unspecified nicotine-induced disorders: Secondary | ICD-10-CM | POA: Diagnosis not present

## 2018-05-30 NOTE — Telephone Encounter (Signed)
Needs to know if patient inr needs to be checked today

## 2018-05-30 NOTE — Telephone Encounter (Signed)
Tried to call [facility] at listed number but it is a non working number.  Called Lucky Chrismon RN Case Mgr [facility] to discuss case.  No answer:  LMOM for her to call me back.  Per note in chart pt's home is infested with bed bugs and [facility] will not go back out until this situation is taken care of.  Social Services is involved per chart.  Pt was discharged from hospital 5/20 on coumadin and Lovenox and needs INR check within 5 days.  D/C INR 1.1  D/C on Lovenox 100mg  bid and warfarin 6mg  daily except 5mg  on M,W,F.  Awaiting return call.

## 2018-05-30 NOTE — Telephone Encounter (Signed)
Copied from [place] [id number]. Topic: Quick Communication - See Telephone Encounter >> May 30, 2018 11:12 AM Antonieta Iba C wrote: CRM for notification. See Telephone encounter for: 05/30/18.   Erline Levine - PT with [facility] called in for VO for a 1 time PT visit -- Erline Levine says that pt has a lot of clutter in her home and is unable to get through her walkways for PT also, pt has a bedbug infestation that she was advised that need to be taken care of before services could be continued.   CB: [phone]

## 2018-05-30 NOTE — Telephone Encounter (Signed)
Copied from [place] [id number]. Topic: Quick Communication - Home Health Verbal Orders >> May 30, 2018 10:11 AM Rayann Heman wrote: Caller/Agency: cherryl calling from [facility] called and stated that the house is infested with bed bugs and they will not be going out until the issue is resolved. Social services has been called.   SJ#[phone]

## 2018-05-31 ENCOUNTER — Telehealth: Payer: Self-pay | Admitting: *Deleted

## 2018-05-31 NOTE — Telephone Encounter (Signed)
Clinic RN attempted to call patient for TCM. No answer. Unable to leave a voice mail since the mailbox is full.

## 2018-06-04 ENCOUNTER — Inpatient Hospital Stay: Payer: Medicare Other | Admitting: Adult Health

## 2018-06-04 NOTE — Telephone Encounter (Signed)
Tried to call pt to discuss hospitalization and being placed on Lovenox.  No answer and unable to leave message.  Voice mail full.

## 2018-06-06 NOTE — Telephone Encounter (Signed)
Still unable to contact patient.  Does not answer phone and voice mail full.

## 2018-06-10 ENCOUNTER — Other Ambulatory Visit (INDEPENDENT_AMBULATORY_CARE_PROVIDER_SITE_OTHER): Payer: Self-pay | Admitting: *Deleted

## 2018-06-10 ENCOUNTER — Encounter (INDEPENDENT_AMBULATORY_CARE_PROVIDER_SITE_OTHER): Payer: Self-pay | Admitting: *Deleted

## 2018-06-10 DIAGNOSIS — D649 Anemia, unspecified: Secondary | ICD-10-CM

## 2018-06-10 DIAGNOSIS — K922 Gastrointestinal hemorrhage, unspecified: Secondary | ICD-10-CM

## 2018-06-20 ENCOUNTER — Ambulatory Visit (INDEPENDENT_AMBULATORY_CARE_PROVIDER_SITE_OTHER): Payer: Medicare Other | Admitting: Internal Medicine

## 2018-07-04 ENCOUNTER — Inpatient Hospital Stay (HOSPITAL_COMMUNITY)
Admission: EM | Admit: 2018-07-04 | Discharge: 2018-07-11 | DRG: 189 | Disposition: A | Discharge status: Home / Self Care | Payer: Medicare Other | Attending: Internal Medicine | Admitting: Internal Medicine

## 2018-07-04 ENCOUNTER — Emergency Department (HOSPITAL_COMMUNITY): Payer: Medicare Other

## 2018-07-04 ENCOUNTER — Encounter (HOSPITAL_COMMUNITY): Payer: Self-pay | Admitting: Emergency Medicine

## 2018-07-04 ENCOUNTER — Other Ambulatory Visit: Payer: Self-pay

## 2018-07-04 DIAGNOSIS — F1010 Alcohol abuse, uncomplicated: Secondary | ICD-10-CM | POA: Diagnosis present

## 2018-07-04 DIAGNOSIS — I2510 Atherosclerotic heart disease of native coronary artery without angina pectoris: Secondary | ICD-10-CM | POA: Diagnosis present

## 2018-07-04 DIAGNOSIS — Z591 Inadequate housing: Secondary | ICD-10-CM

## 2018-07-04 DIAGNOSIS — Z7951 Long term (current) use of inhaled steroids: Secondary | ICD-10-CM

## 2018-07-04 DIAGNOSIS — R0602 Shortness of breath: Secondary | ICD-10-CM | POA: Diagnosis not present

## 2018-07-04 DIAGNOSIS — J9622 Acute and chronic respiratory failure with hypercapnia: Principal | ICD-10-CM | POA: Diagnosis present

## 2018-07-04 DIAGNOSIS — Z79891 Long term (current) use of opiate analgesic: Secondary | ICD-10-CM

## 2018-07-04 DIAGNOSIS — J9621 Acute and chronic respiratory failure with hypoxia: Secondary | ICD-10-CM | POA: Diagnosis present

## 2018-07-04 DIAGNOSIS — E785 Hyperlipidemia, unspecified: Secondary | ICD-10-CM | POA: Diagnosis present

## 2018-07-04 DIAGNOSIS — I10 Essential (primary) hypertension: Secondary | ICD-10-CM | POA: Diagnosis present

## 2018-07-04 DIAGNOSIS — J441 Chronic obstructive pulmonary disease with (acute) exacerbation: Secondary | ICD-10-CM | POA: Diagnosis present

## 2018-07-04 DIAGNOSIS — I491 Atrial premature depolarization: Secondary | ICD-10-CM | POA: Diagnosis not present

## 2018-07-04 DIAGNOSIS — G894 Chronic pain syndrome: Secondary | ICD-10-CM | POA: Diagnosis present

## 2018-07-04 DIAGNOSIS — Z20828 Contact with and (suspected) exposure to other viral communicable diseases: Secondary | ICD-10-CM | POA: Diagnosis not present

## 2018-07-04 DIAGNOSIS — Z9981 Dependence on supplemental oxygen: Secondary | ICD-10-CM

## 2018-07-04 DIAGNOSIS — Z825 Family history of asthma and other chronic lower respiratory diseases: Secondary | ICD-10-CM

## 2018-07-04 DIAGNOSIS — K920 Hematemesis: Secondary | ICD-10-CM

## 2018-07-04 DIAGNOSIS — Z86711 Personal history of pulmonary embolism: Secondary | ICD-10-CM

## 2018-07-04 DIAGNOSIS — D6851 Activated protein C resistance: Secondary | ICD-10-CM | POA: Diagnosis present

## 2018-07-04 DIAGNOSIS — L03116 Cellulitis of left lower limb: Secondary | ICD-10-CM | POA: Diagnosis present

## 2018-07-04 DIAGNOSIS — I252 Old myocardial infarction: Secondary | ICD-10-CM

## 2018-07-04 DIAGNOSIS — Z7901 Long term (current) use of anticoagulants: Secondary | ICD-10-CM

## 2018-07-04 DIAGNOSIS — F329 Major depressive disorder, single episode, unspecified: Secondary | ICD-10-CM | POA: Diagnosis present

## 2018-07-04 DIAGNOSIS — D6859 Other primary thrombophilia: Secondary | ICD-10-CM | POA: Diagnosis present

## 2018-07-04 DIAGNOSIS — Z86718 Personal history of other venous thrombosis and embolism: Secondary | ICD-10-CM

## 2018-07-04 DIAGNOSIS — F17210 Nicotine dependence, cigarettes, uncomplicated: Secondary | ICD-10-CM | POA: Diagnosis present

## 2018-07-04 DIAGNOSIS — R58 Hemorrhage, not elsewhere classified: Secondary | ICD-10-CM | POA: Diagnosis not present

## 2018-07-04 DIAGNOSIS — R404 Transient alteration of awareness: Secondary | ICD-10-CM | POA: Diagnosis not present

## 2018-07-04 DIAGNOSIS — J9620 Acute and chronic respiratory failure, unspecified whether with hypoxia or hypercapnia: Secondary | ICD-10-CM | POA: Diagnosis present

## 2018-07-04 DIAGNOSIS — Z7902 Long term (current) use of antithrombotics/antiplatelets: Secondary | ICD-10-CM

## 2018-07-04 DIAGNOSIS — E039 Hypothyroidism, unspecified: Secondary | ICD-10-CM | POA: Diagnosis present

## 2018-07-04 DIAGNOSIS — R4020 Unspecified coma: Secondary | ICD-10-CM | POA: Diagnosis not present

## 2018-07-04 DIAGNOSIS — D6852 Prothrombin gene mutation: Secondary | ICD-10-CM | POA: Diagnosis present

## 2018-07-04 DIAGNOSIS — Z8249 Family history of ischemic heart disease and other diseases of the circulatory system: Secondary | ICD-10-CM

## 2018-07-04 DIAGNOSIS — F419 Anxiety disorder, unspecified: Secondary | ICD-10-CM | POA: Diagnosis present

## 2018-07-04 DIAGNOSIS — R778 Other specified abnormalities of plasma proteins: Secondary | ICD-10-CM

## 2018-07-04 DIAGNOSIS — L03115 Cellulitis of right lower limb: Secondary | ICD-10-CM | POA: Diagnosis present

## 2018-07-04 LAB — CBC WITH DIFFERENTIAL/PLATELET
Abs Immature Granulocytes: 0.02 10*3/uL (ref 0.00–0.07)
Basophils Absolute: 0 10*3/uL (ref 0.0–0.1)
Basophils Relative: 0 %
Eosinophils Absolute: 0 10*3/uL (ref 0.0–0.5)
Eosinophils Relative: 0 %
HCT: 34.9 % — ABNORMAL LOW (ref 36.0–46.0)
Hemoglobin: 10.2 g/dL — ABNORMAL LOW (ref 12.0–15.0)
Immature Granulocytes: 0 %
Lymphocytes Relative: 7 %
Lymphs Abs: 0.7 10*3/uL (ref 0.7–4.0)
MCH: 29.2 pg (ref 26.0–34.0)
MCHC: 29.2 g/dL — ABNORMAL LOW (ref 30.0–36.0)
MCV: 100 fL (ref 80.0–100.0)
Monocytes Absolute: 0.8 10*3/uL (ref 0.1–1.0)
Monocytes Relative: 7 %
Neutro Abs: 9 10*3/uL — ABNORMAL HIGH (ref 1.7–7.7)
Neutrophils Relative %: 86 %
Platelets: 192 10*3/uL (ref 150–400)
RBC: 3.49 MIL/uL — ABNORMAL LOW (ref 3.87–5.11)
RDW: 15.8 % — ABNORMAL HIGH (ref 11.5–15.5)
WBC: 10.5 10*3/uL (ref 4.0–10.5)
nRBC: 0 % (ref 0.0–0.2)

## 2018-07-04 LAB — TROPONIN I (HIGH SENSITIVITY): Troponin I (High Sensitivity): 1031 ng/L (ref <18)

## 2018-07-04 LAB — COMPREHENSIVE METABOLIC PANEL
ALT: 12 U/L (ref 0–44)
AST: 26 U/L (ref 15–41)
Albumin: 3.4 g/dL — ABNORMAL LOW (ref 3.5–5.0)
Alkaline Phosphatase: 88 U/L (ref 38–126)
Anion gap: 10 (ref 5–15)
BUN: 20 mg/dL (ref 8–23)
CO2: 38 mmol/L — ABNORMAL HIGH (ref 22–32)
Calcium: 8.9 mg/dL (ref 8.9–10.3)
Chloride: 97 mmol/L — ABNORMAL LOW (ref 98–111)
Creatinine, Ser: 0.75 mg/dL (ref 0.44–1.00)
GFR calc Af Amer: >60 mL/min (ref >60)
GFR calc non Af Amer: >60 mL/min (ref >60)
Glucose, Bld: 99 mg/dL (ref 70–99)
Potassium: 4 mmol/L (ref 3.5–5.1)
Sodium: 145 mmol/L (ref 135–145)
Total Bilirubin: 0.6 mg/dL (ref 0.3–1.2)
Total Protein: 6.7 g/dL (ref 6.5–8.1)

## 2018-07-04 LAB — TYPE AND SCREEN
ABO/RH(D): A POS
Antibody Screen: NEGATIVE

## 2018-07-04 LAB — BLOOD GAS, ARTERIAL
Acid-Base Excess: 14 mmol/L — ABNORMAL HIGH (ref 0.0–2.0)
Bicarbonate: 36.1 mmol/L — ABNORMAL HIGH (ref 20.0–28.0)
Drawn by: 317771
FIO2: 100
O2 Saturation: 99.2 %
Patient temperature: 36.4
pCO2 arterial: 97.7 mmHg (ref 32.0–48.0)
pH, Arterial: 7.252 — ABNORMAL LOW (ref 7.350–7.450)
pO2, Arterial: 316 mmHg — ABNORMAL HIGH (ref 83.0–108.0)

## 2018-07-04 LAB — LACTIC ACID, PLASMA: Lactic Acid, Venous: 1.8 mmol/L (ref 0.5–1.9)

## 2018-07-04 LAB — SARS CORONAVIRUS 2 BY RT PCR (HOSPITAL ORDER, PERFORMED IN ~~LOC~~ HOSPITAL LAB): SARS Coronavirus 2: NEGATIVE

## 2018-07-04 LAB — LIPASE, BLOOD: Lipase: 28 U/L (ref 11–51)

## 2018-07-04 LAB — POC OCCULT BLOOD, ED: Fecal Occult Bld: NEGATIVE

## 2018-07-04 LAB — PROTIME-INR
INR: 1.6 — ABNORMAL HIGH (ref 0.8–1.2)
Prothrombin Time: 18.4 seconds — ABNORMAL HIGH (ref 11.4–15.2)

## 2018-07-04 MED ORDER — NALOXONE HCL 2 MG/2ML IJ SOSY
1.0000 mg | PREFILLED_SYRINGE | Freq: Once | INTRAMUSCULAR | Status: AC
  Administered 2018-07-04: 1 mg via INTRAVENOUS
  Filled 2018-07-04: qty 2

## 2018-07-04 MED ORDER — SODIUM CHLORIDE 0.9 % IV BOLUS (SEPSIS)
500.0000 mL | Freq: Once | INTRAVENOUS | Status: AC
  Administered 2018-07-04: 500 mL via INTRAVENOUS

## 2018-07-04 MED ORDER — SODIUM CHLORIDE 0.9 % IV SOLN
1000.0000 mL | INTRAVENOUS | Status: DC
  Administered 2018-07-04: 1000 mL via INTRAVENOUS

## 2018-07-04 MED ORDER — NALOXONE HCL 0.4 MG/ML IJ SOLN
0.4000 mg | Freq: Once | INTRAMUSCULAR | Status: AC
  Administered 2018-07-04: 0.4 mg via INTRAVENOUS
  Filled 2018-07-04: qty 1

## 2018-07-04 NOTE — Progress Notes (Signed)
Pt placed on BIPAP.  Tolerating well at this time.  RT will continue to monitor.

## 2018-07-04 NOTE — ED Notes (Signed)
CRITICAL VALUE ALERT  Critical Value:  Troponin 1031  Date & Time Notied: 07/04/18 2147  Provider Notified: Lily Kocher, PA  Orders Received/Actions taken: n/a

## 2018-07-04 NOTE — ED Notes (Signed)
CRITICAL VALUE ALERT  Critical Value:  ABG pCO2 97.7  Date & Time Notied:  07/04/18 2053  Provider Notified: [name] Christen, MD  Orders Received/Actions taken: n/a

## 2018-07-04 NOTE — ED Triage Notes (Signed)
EMS called to pts residence for hematemesis. EMS pt had syncopal episode and periods of apnea en route. Pt O2 initially 100%. Pt O2 at this time 83% via [facility].

## 2018-07-04 NOTE — ED Provider Notes (Signed)
[facility] EMERGENCY DEPARTMENT Provider Note   CSN: [id number] Arrival date & time: 07/04/18  1916     History   Chief Complaint Chief Complaint  Patient presents with   Hematemesis    HPI Seniyah Esker is a 68 y.o. female.     Patient is a 68 year old female who presents to the emergency department by EMS because of hematemesis.  This is a level 5 caveat due to patient's level of consciousness.  The EMS personnel report that a friend called because the patient is having hematemesis.  The friend also reports that the patient has not been very awake and alert over the last couple of days.  EMS reports that the patient had periods of apnea and in route to the emergency department.  The oxygen level dropped to 83 and patient required nonrebreather mask.  The history is provided by the EMS personnel and a friend.    Past Medical History:  Diagnosis Date   Acute MI ([place])    x4, code blue x3   Arteriosclerotic cardiovascular disease (ASCVD) 2002   Inf STEMI-2002. 2003-cutting balloon + brachytherapy for restenosis; subsequent acute stent thrombosis 06/2010 requiring 2 separate interventions (Zeta stent, then repeat cath with thrombectomy). focal basal inf AK, nl EF; 03/2011: Patent stents, minor nonobst  residual dz, nl EF; neg stress nuclear in 2008 and stress echo in 2009   Chronic anticoagulation    Warfarin plus ticagrelor   Chronic respiratory failure ([place]) 08/27/2013   On 2L 02   COPD (chronic obstructive pulmonary disease) ([place])    02 dependent   COPD (chronic obstructive pulmonary disease) ([place])    Factor 5 Leiden mutation, heterozygous ([place])    Factor V Leiden, prothrombin gene mutation ([place]) 2006   Hyperlipidemia    Hypothyroidism    Noncompliance    Pelvic fracture ([place]) 2009   Pulmonary embolism ([place]) 2006   Associated with deep vein thrombosis-2006; + factor V Leiden   Tobacco abuse    50 pack years    Patient Active Problem List   Diagnosis Date Noted   Malnutrition of moderate degree 05/24/2018   Acute on chronic respiratory failure ([place]) 05/23/2018   Melena 05/23/2018   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   Depression 05/23/2018   Chronic pain syndrome 05/23/2018   Hypercapnia 05/23/2018   Acute on chronic respiratory failure with hypoxia and hypercapnia ([place]) 01/29/2018   Acute and chronic respiratory failure (acute-on-chronic) ([place]) 01/29/2018   Pressure injury of skin 01/29/2018   Hyperglycemia 01/21/2018   COPD with exacerbation ([place]) 01/21/2018   Vertebral fracture, osteoporotic ([place]) 01/20/2018   Rectal bleeding 09/05/2017   Left lower quadrant pain 09/05/2017   Oxygen dependent 09/05/2017   Chronic back pain 05/16/2017   COPD exacerbation ([place]) 02/19/2017   Tachycardia 02/19/2017   Influenza A 02/19/2017   Acute respiratory failure ([place]) 02/19/2017   Dyspnea 11/26/2014   Loss of weight 11/26/2014   Chronic respiratory failure ([place]) 08/27/2013   Chest pain 08/24/2013   Encounter for therapeutic drug monitoring 02/13/2013   Abnormal weight loss 03/21/2012   Cerebrovascular disease 11/15/2011   CAD S/P percutaneous coronary angioplasty    Factor V Leiden, prothrombin gene mutation ([place])    Hyperlipidemia    Chronic anticoagulation    Hypothyroidism 11/23/2006   TOBACCO ABUSE 11/23/2006   History of pulmonary embolus (PE) 11/23/2006   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    Past Surgical History:  Procedure Laterality Date   COLONOSCOPY  Approximately 2000   Negative screening study   COLONOSCOPY WITH PROPOFOL N/A 05/28/2018   Procedure: COLONOSCOPY WITH PROPOFOL;  Surgeon: Rogene Houston, MD;  Location[facility];  Service: Endoscopy;  Laterality: N/A;   CORONARY ANGIOPLASTY  2002, 2003, 2012   ESOPHAGOGASTRODUODENOSCOPY (EGD) WITH PROPOFOL N/A 05/26/2018   Procedure: ESOPHAGOGASTRODUODENOSCOPY (EGD) WITH  PROPOFOL;  Surgeon: Rogene Houston, MD;  Location: [facility] END[facility];  Service: Endoscopy;  Laterality: N/A;   LEFT AND RIGHT HEART CATHETERIZATION WITH CORONARY ANGIOGRAM N/A 04/03/2011   Procedure: LEFT AND RIGHT HEART CATHETERIZATION WITH CORONARY ANGIOGRAM;  Surgeon: Sherren Mocha, MD;  Location: [facility] CATH LAB;  Service: Cardiovascular;  Laterality: N/A;   POLYPECTOMY  05/28/2018   Procedure: POLYPECTOMY;  Surgeon: Rogene Houston, MD;  Location: [facility] END[facility];  Service: Endoscopy;;  cold snare and biopsy forcep     OB History    Gravida  3   Para  2   Term  2   Preterm      AB  1   Living        SAB      TAB  1   Ectopic      Multiple      Live Births               Home Medications    Prior to Admission medications   Medication Sig Start Date End Date Taking? Authorizing Provider  albuterol (PROVENTIL) (2.5 MG/3ML) 0.083% nebulizer solution USE 1 VIAL BY NEBULIZER EVERY 4 HOURS AS NEEDED FOR WHEEZING. DX: J44.9 Patient taking differently: Take 2.5 mg by nebulization every 4 (four) hours as needed for wheezing or shortness of breath.  01/24/17   Magdalen Spatz, NP  arformoterol (BROVANA) 15 MCG/2ML NEBU Take 2 mLs (15 mcg total) by nebulization 2 (two) times daily. 09/07/17   Juanito Doom, MD  baclofen (LIORESAL) 10 MG tablet Take 1 tablet (10 mg total) by mouth 3 (three) times daily. 01/24/18   Lavina Hamman, MD  bisacodyl (DULCOLAX) 5 MG EC tablet Take 1 tablet (5 mg total) by mouth daily as needed for mild constipation. 05/29/18   Manuella Ghazi, Pratik D, DO  budesonide (PULMICORT) 0.5 MG/2ML nebulizer solution Take 2 mLs (0.5 mg total) by nebulization 2 (two) times daily. Dx: J43.9 01/24/17   Magdalen Spatz, NP  buPROPion (WELLBUTRIN SR) 100 MG 12 hr tablet Take 100 mg by mouth every morning.    [provider]  citalopram (CELEXA) 10 MG tablet Take 1 tablet by mouth daily. 04/29/18   [provider]  enoxaparin (LOVENOX) 80 MG/0.8ML injection Inject  1 mL (100 mg total) into the skin daily. 05/29/18 05/29/19  Manuella Ghazi, Pratik D, DO  fentaNYL (DURAGESIC) 50 MCG/HR Place 1 patch onto the skin every 3 (three) days. 02/19/18   Johnson, Clanford L, MD  ferrous sulfate 324 (65 Fe) MG TBEC Take 1 tablet (325 mg total) by mouth daily with breakfast. 02/21/18   Wynetta Emery, Clanford L, MD  folic acid (FOLVITE) 1 MG tablet Take 1 tablet (1 mg total) by mouth daily. 02/21/18 02/21/19  Johnson, Clanford L, MD  HYDROcodone-acetaminophen (NORCO/VICODIN) 5-325 MG tablet Take 1 tablet by mouth every 6 (six) hours as needed (BREAKTHROUGH PAIN). 02/19/18   Johnson, Clanford L, MD  ipratropium-albuterol (DUONEB) 0.5-2.5 (3) MG/3ML SOLN Take 3 mLs by nebulization 4 (four) times daily. 03/20/17   Magdalen Spatz, NP  levothyroxine (SYNTHROID, LEVOTHROID) 125 MCG tablet Take 1 tablet (125 mcg  total) by mouth daily. 08/16/17   Nafziger, Tommi Rumps, NP  lidocaine (LIDODERM) 5 % Place 1 patch onto the skin daily. Remove & Discard patch within 12 hours or as directed by MD Patient not taking: Reported on 05/23/2018 01/24/18   Lavina Hamman, MD  nitroGLYCERIN (NITROSTAT) 0.4 MG SL tablet Place 1 tablet (0.4 mg total) under the tongue every 5 (five) minutes as needed for chest pain. 10/28/14   Herminio Commons, MD  Omega-3 Fatty Acids (FISH OIL) 600 MG CAPS Take 1 capsule by mouth daily.     [provider]  pantoprazole (PROTONIX) 40 MG tablet Take 1 tablet (40 mg total) by mouth daily. 02/20/18   Johnson, Clanford L, MD  polyethylene glycol (MIRALAX / GLYCOLAX) packet Take 17 g by mouth daily. Patient not taking: Reported on 02/15/2018 01/25/18   Lavina Hamman, MD  Respiratory Therapy Supplies (FLUTTER) DEVI Use as directed 05/14/15   Javier Glazier, MD  ticagrelor (BRILINTA) 60 MG TABS tablet Take 1 tablet (60 mg total) by mouth 2 (two) times daily. 02/01/18   Johnson, Clanford L, MD  traZODone (DESYREL) 50 MG tablet Take 50 mg by mouth at bedtime as needed for sleep.    [provider]  warfarin (COUMADIN) 1 MG tablet Take 1 mg by mouth See admin instructions. Alternates taking 6mg  with 5mg  daily. Takes 5mg  on Mondays, Wednesdays, and Fridays. Takes 6mg  on all other days    [provider]    Family History Family History  Problem Relation Age of Onset   Emphysema Mother    Alzheimer's disease Mother    Emphysema Sister    Heart disease Father    Factor V Leiden deficiency Father    Factor V Leiden deficiency Sister    Factor V Leiden deficiency Daughter    Kidney cancer Paternal Grandmother     Social History Social History   Tobacco Use   Smoking status: Current Some Day Smoker    Packs/day: 0.50    Years: 50.00    Pack years: 25.00    Types: Cigarettes    Start date: 05/10/1968   Smokeless tobacco: Never Used  Substance Use Topics   Alcohol use: No    Alcohol/week: 0.0 standard drinks   Drug use: No     Allergies   Dilaudid [hydromorphone hcl], Minocycline hcl, Prednisone, Varenicline tartrate, and Zocor [simvastatin - high dose]   Review of Systems Review of Systems  Unable to perform ROS: Mental status change  Constitutional: Positive for activity change.  Respiratory: Positive for apnea.   Gastrointestinal: Positive for vomiting.       Hematemesis  Neurological: Positive for syncope.     Physical Exam Updated Vital Signs BP (!) 148/70 (BP Location: Right Arm)    Pulse (!) 102    Temp 97.6 F (36.4 C) (Oral)    Resp 17    SpO2 100%   Physical Exam Constitutional:      Appearance: She is toxic-appearing.     Interventions: Face mask in place.  HENT:     Head:     Comments: Small amount of dried blood around mouth. No trauma to tongue or oral pharynx Cardiovascular:     Heart sounds: Murmur present. Systolic murmur present. Diastolic murmur present.     Comments: Regular rate and rhythm.  No rub or gallop appreciated. Pulmonary:     Comments: There is symmetrical rise and fall of the chest.   Decreased breath sounds particularly  at the bases. Abdominal:     Tenderness: There is no right CVA tenderness or left CVA tenderness.  Musculoskeletal:     Right lower leg: 1+ Pitting Edema present.     Left lower leg: 1+ Pitting Edema present.  Neurological:     Mental Status: She is lethargic.     Comments: Falling asleep during my exam, but can be aroused by voice.      ED Treatments / Results  Labs (all labs ordered are listed, but only abnormal results are displayed) Labs Reviewed - No data to display  EKG    Radiology No results found.  Procedures Procedures (including critical care time) CRITICAL CARE Performed by: Lily Kocher Total critical care time: **30* minutes Critical care time was exclusive of separately billable procedures and treating other patients. Critical care was necessary to treat or prevent imminent or life-threatening deterioration. Critical care was time spent personally by me on the following activities: development of treatment plan with patient and/or surrogate as well as nursing, discussions with consultants, evaluation of patient's response to treatment, examination of patient, obtaining history from patient or surrogate, ordering and performing treatments and interventions, ordering and review of laboratory studies, ordering and review of radiographic studies, pulse oximetry and re-evaluation of patient's condition.  Medications Ordered in ED Medications - No data to display   Initial Impression / Assessment and Plan / ED Course  I have reviewed the triage vital signs and the nursing notes.  Pertinent labs & imaging results that were available during my care of the patient were reviewed by me and considered in my medical decision making (see chart for details).          Final Clinical Impressions(s) / ED Diagnoses  MDM Patient is tachycardic at 103 bpm.  She is tachypneic at 20 to 21 breaths/min.  Blood pressure is elevated at  148/70.  Pulse oximetry is 100% on nonrebreather mask. Patient is lethargic.  She falls asleep during examination and in between questioning.  She can be aroused by voice or by shaking.   8:57pm. Little to no response to 0.4 narcan. Case discussed with Dr Lacinda Axon will give a 1mg  dose.  Received a critical arterial blood gas values. pH is 7.25, PCO2 is elevated at 97.7.  PO2 is elevated at 316, bicarb is elevated at 36.1.  Stool for occult blood is negative. The complete blood count shows the hemoglobin to be slightly low at 10.2, and the hematocrit low at 34.9.  The white blood cell count is 10,500, there is no shift to the left.  The PT is elevated at 18.4, and the INR is 1.6.  Patient is on Coumadin.  Received a critical lab value with the troponin being 1031.  Discussed with Dr. Lacinda Axon.  Will also discuss with cardiology. Attempted to call Ms Unknown Foley -daughter, but no answer on phone at this time.  CMET -chloride was 97, the CO2 is elevated at 38, similar to previous readings for this patient.  The metabolic panel is otherwise within normal limits.  The complete blood count shows the white blood cells to be normal at 10,500, the hemoglobin is slightly low at 10.2, improved from 8.9 a month ago.  The hematocrit is 34.9, improved from 28.4 a month ago.  Complete blood count otherwise within normal limits.  After BiPap pt is a little more alert. Answers a few questions.  Case discussed with cardiology.  It is currently the opinion that the elevated troponin is related  to the hypoxia and respiratory supply and demand mismatch.  Will discuss with hospitalist for admission.    Final diagnoses:  Acute on chronic respiratory failure with hypoxia [facility])    ED Discharge Orders    None       Lily Kocher, PA-C 07/05/18 0149    [name] Christen, MD 07/08/18 [phone]

## 2018-07-05 ENCOUNTER — Inpatient Hospital Stay (HOSPITAL_COMMUNITY): Payer: Medicare Other

## 2018-07-05 DIAGNOSIS — I340 Nonrheumatic mitral (valve) insufficiency: Secondary | ICD-10-CM | POA: Diagnosis not present

## 2018-07-05 DIAGNOSIS — I2510 Atherosclerotic heart disease of native coronary artery without angina pectoris: Secondary | ICD-10-CM | POA: Diagnosis not present

## 2018-07-05 DIAGNOSIS — Z86711 Personal history of pulmonary embolism: Secondary | ICD-10-CM | POA: Diagnosis not present

## 2018-07-05 DIAGNOSIS — D6852 Prothrombin gene mutation: Secondary | ICD-10-CM | POA: Diagnosis present

## 2018-07-05 DIAGNOSIS — Z86718 Personal history of other venous thrombosis and embolism: Secondary | ICD-10-CM | POA: Diagnosis not present

## 2018-07-05 DIAGNOSIS — Z955 Presence of coronary angioplasty implant and graft: Secondary | ICD-10-CM | POA: Diagnosis not present

## 2018-07-05 DIAGNOSIS — G894 Chronic pain syndrome: Secondary | ICD-10-CM | POA: Diagnosis present

## 2018-07-05 DIAGNOSIS — K920 Hematemesis: Secondary | ICD-10-CM | POA: Diagnosis not present

## 2018-07-05 DIAGNOSIS — D6859 Other primary thrombophilia: Secondary | ICD-10-CM | POA: Diagnosis present

## 2018-07-05 DIAGNOSIS — Z591 Inadequate housing: Secondary | ICD-10-CM | POA: Diagnosis not present

## 2018-07-05 DIAGNOSIS — L03119 Cellulitis of unspecified part of limb: Secondary | ICD-10-CM | POA: Diagnosis not present

## 2018-07-05 DIAGNOSIS — E039 Hypothyroidism, unspecified: Secondary | ICD-10-CM | POA: Diagnosis present

## 2018-07-05 DIAGNOSIS — F329 Major depressive disorder, single episode, unspecified: Secondary | ICD-10-CM | POA: Diagnosis present

## 2018-07-05 DIAGNOSIS — R5381 Other malaise: Secondary | ICD-10-CM | POA: Diagnosis not present

## 2018-07-05 DIAGNOSIS — R7989 Other specified abnormal findings of blood chemistry: Secondary | ICD-10-CM

## 2018-07-05 DIAGNOSIS — D6851 Activated protein C resistance: Secondary | ICD-10-CM | POA: Diagnosis present

## 2018-07-05 DIAGNOSIS — F419 Anxiety disorder, unspecified: Secondary | ICD-10-CM | POA: Diagnosis present

## 2018-07-05 DIAGNOSIS — D649 Anemia, unspecified: Secondary | ICD-10-CM | POA: Diagnosis not present

## 2018-07-05 DIAGNOSIS — R1110 Vomiting, unspecified: Secondary | ICD-10-CM | POA: Diagnosis not present

## 2018-07-05 DIAGNOSIS — J9622 Acute and chronic respiratory failure with hypercapnia: Principal | ICD-10-CM

## 2018-07-05 DIAGNOSIS — F1010 Alcohol abuse, uncomplicated: Secondary | ICD-10-CM | POA: Diagnosis present

## 2018-07-05 DIAGNOSIS — D682 Hereditary deficiency of other clotting factors: Secondary | ICD-10-CM

## 2018-07-05 DIAGNOSIS — L03115 Cellulitis of right lower limb: Secondary | ICD-10-CM | POA: Diagnosis present

## 2018-07-05 DIAGNOSIS — I25119 Atherosclerotic heart disease of native coronary artery with unspecified angina pectoris: Secondary | ICD-10-CM | POA: Diagnosis not present

## 2018-07-05 DIAGNOSIS — I25118 Atherosclerotic heart disease of native coronary artery with other forms of angina pectoris: Secondary | ICD-10-CM | POA: Diagnosis not present

## 2018-07-05 DIAGNOSIS — J441 Chronic obstructive pulmonary disease with (acute) exacerbation: Secondary | ICD-10-CM | POA: Diagnosis not present

## 2018-07-05 DIAGNOSIS — Z209 Contact with and (suspected) exposure to unspecified communicable disease: Secondary | ICD-10-CM | POA: Diagnosis not present

## 2018-07-05 DIAGNOSIS — Z743 Need for continuous supervision: Secondary | ICD-10-CM | POA: Diagnosis not present

## 2018-07-05 DIAGNOSIS — R112 Nausea with vomiting, unspecified: Secondary | ICD-10-CM | POA: Diagnosis not present

## 2018-07-05 DIAGNOSIS — L03116 Cellulitis of left lower limb: Secondary | ICD-10-CM | POA: Diagnosis present

## 2018-07-05 DIAGNOSIS — F17210 Nicotine dependence, cigarettes, uncomplicated: Secondary | ICD-10-CM | POA: Diagnosis present

## 2018-07-05 DIAGNOSIS — I252 Old myocardial infarction: Secondary | ICD-10-CM | POA: Diagnosis not present

## 2018-07-05 DIAGNOSIS — Z7901 Long term (current) use of anticoagulants: Secondary | ICD-10-CM | POA: Diagnosis not present

## 2018-07-05 DIAGNOSIS — K219 Gastro-esophageal reflux disease without esophagitis: Secondary | ICD-10-CM | POA: Diagnosis not present

## 2018-07-05 DIAGNOSIS — J9621 Acute and chronic respiratory failure with hypoxia: Secondary | ICD-10-CM | POA: Diagnosis not present

## 2018-07-05 DIAGNOSIS — E785 Hyperlipidemia, unspecified: Secondary | ICD-10-CM | POA: Diagnosis present

## 2018-07-05 DIAGNOSIS — I10 Essential (primary) hypertension: Secondary | ICD-10-CM | POA: Diagnosis present

## 2018-07-05 DIAGNOSIS — E782 Mixed hyperlipidemia: Secondary | ICD-10-CM | POA: Diagnosis not present

## 2018-07-05 DIAGNOSIS — I248 Other forms of acute ischemic heart disease: Secondary | ICD-10-CM | POA: Diagnosis not present

## 2018-07-05 DIAGNOSIS — Z20828 Contact with and (suspected) exposure to other viral communicable diseases: Secondary | ICD-10-CM | POA: Diagnosis present

## 2018-07-05 DIAGNOSIS — Z9981 Dependence on supplemental oxygen: Secondary | ICD-10-CM | POA: Diagnosis not present

## 2018-07-05 DIAGNOSIS — R778 Other specified abnormalities of plasma proteins: Secondary | ICD-10-CM

## 2018-07-05 DIAGNOSIS — W19XXXA Unspecified fall, initial encounter: Secondary | ICD-10-CM | POA: Diagnosis not present

## 2018-07-05 LAB — BLOOD GAS, ARTERIAL
Acid-Base Excess: 12.8 mmol/L — ABNORMAL HIGH (ref 0.0–2.0)
Acid-Base Excess: 10.4 mmol/L — ABNORMAL HIGH (ref 0.0–2.0)
Bicarbonate: 35 mmol/L — ABNORMAL HIGH (ref 20.0–28.0)
Bicarbonate: 33.4 mmol/L — ABNORMAL HIGH (ref 20.0–28.0)
Delivery systems: POSITIVE
Drawn by: 27407
Expiratory PAP: 6
FIO2: 40
FIO2: 40
Inspiratory PAP: 12
O2 Saturation: 91.7 %
O2 Saturation: 95.5 %
Patient temperature: 37
Patient temperature: 36.6
pCO2 arterial: 92.5 mmHg (ref 32.0–48.0)
pCO2 arterial: 61.7 mmHg — ABNORMAL HIGH (ref 32.0–48.0)
pH, Arterial: 7.259 — ABNORMAL LOW (ref 7.350–7.450)
pH, Arterial: 7.382 (ref 7.350–7.450)
pO2, Arterial: 69.7 mmHg — ABNORMAL LOW (ref 83.0–108.0)
pO2, Arterial: 79.7 mmHg — ABNORMAL LOW (ref 83.0–108.0)

## 2018-07-05 LAB — COMPREHENSIVE METABOLIC PANEL
ALT: 11 U/L (ref 0–44)
AST: 23 U/L (ref 15–41)
Albumin: 2.8 g/dL — ABNORMAL LOW (ref 3.5–5.0)
Alkaline Phosphatase: 71 U/L (ref 38–126)
Anion gap: 9 (ref 5–15)
BUN: 19 mg/dL (ref 8–23)
CO2: 34 mmol/L — ABNORMAL HIGH (ref 22–32)
Calcium: 8.1 mg/dL — ABNORMAL LOW (ref 8.9–10.3)
Chloride: 102 mmol/L (ref 98–111)
Creatinine, Ser: 0.6 mg/dL (ref 0.44–1.00)
GFR calc Af Amer: >60 mL/min (ref >60)
GFR calc non Af Amer: >60 mL/min (ref >60)
Glucose, Bld: 75 mg/dL (ref 70–99)
Potassium: 4 mmol/L (ref 3.5–5.1)
Sodium: 145 mmol/L (ref 135–145)
Total Bilirubin: 0.5 mg/dL (ref 0.3–1.2)
Total Protein: 5.4 g/dL — ABNORMAL LOW (ref 6.5–8.1)

## 2018-07-05 LAB — ECHOCARDIOGRAM COMPLETE

## 2018-07-05 LAB — TROPONIN I (HIGH SENSITIVITY)
Troponin I (High Sensitivity): 879 ng/L (ref <18)
Troponin I (High Sensitivity): 894 ng/L (ref <18)

## 2018-07-05 LAB — BRAIN NATRIURETIC PEPTIDE: B Natriuretic Peptide: 117 pg/mL — ABNORMAL HIGH (ref 0.0–100.0)

## 2018-07-05 LAB — CBC
HCT: 29.9 % — ABNORMAL LOW (ref 36.0–46.0)
Hemoglobin: 8.4 g/dL — ABNORMAL LOW (ref 12.0–15.0)
MCH: 28.3 pg (ref 26.0–34.0)
MCHC: 28.1 g/dL — ABNORMAL LOW (ref 30.0–36.0)
MCV: 100.7 fL — ABNORMAL HIGH (ref 80.0–100.0)
Platelets: 145 10*3/uL — ABNORMAL LOW (ref 150–400)
RBC: 2.97 MIL/uL — ABNORMAL LOW (ref 3.87–5.11)
RDW: 15.8 % — ABNORMAL HIGH (ref 11.5–15.5)
WBC: 8.3 10*3/uL (ref 4.0–10.5)
nRBC: 0 % (ref 0.0–0.2)

## 2018-07-05 LAB — MRSA PCR SCREENING: MRSA by PCR: NEGATIVE

## 2018-07-05 LAB — HEPARIN LEVEL (UNFRACTIONATED): Heparin Unfractionated: 0.41 IU/mL (ref 0.30–0.70)

## 2018-07-05 MED ORDER — IPRATROPIUM-ALBUTEROL 0.5-2.5 (3) MG/3ML IN SOLN
3.0000 mL | Freq: Four times a day (QID) | RESPIRATORY_TRACT | Status: DC
  Administered 2018-07-05 – 2018-07-08 (×15): 3 mL via RESPIRATORY_TRACT
  Filled 2018-07-05 (×16): qty 3

## 2018-07-05 MED ORDER — ONDANSETRON HCL 4 MG/2ML IJ SOLN: 4.0000 mg | Freq: Four times a day (QID) | INTRAMUSCULAR | Status: DC | PRN

## 2018-07-05 MED ORDER — FERROUS SULFATE 325 (65 FE) MG PO TABS
325.0000 mg | ORAL_TABLET | Freq: Every day | ORAL | Status: DC
  Administered 2018-07-06 – 2018-07-11 (×6): 325 mg via ORAL
  Filled 2018-07-05 (×8): qty 1

## 2018-07-05 MED ORDER — ALBUTEROL SULFATE (2.5 MG/3ML) 0.083% IN NEBU: 2.5000 mg | INHALATION_SOLUTION | RESPIRATORY_TRACT | Status: DC | PRN

## 2018-07-05 MED ORDER — HEPARIN BOLUS VIA INFUSION
4000.0000 [IU] | Freq: Once | INTRAVENOUS | Status: AC
  Administered 2018-07-05: 4000 [IU] via INTRAVENOUS
  Filled 2018-07-05: qty 4000

## 2018-07-05 MED ORDER — SODIUM CHLORIDE 0.9 % IV SOLN
1000.0000 mL | INTRAVENOUS | Status: DC
  Administered 2018-07-05 – 2018-07-08 (×4): 1000 mL via INTRAVENOUS

## 2018-07-05 MED ORDER — BUDESONIDE 0.5 MG/2ML IN SUSP
0.5000 mg | Freq: Two times a day (BID) | RESPIRATORY_TRACT | Status: DC
  Administered 2018-07-05 – 2018-07-11 (×13): 0.5 mg via RESPIRATORY_TRACT
  Filled 2018-07-05 (×14): qty 2

## 2018-07-05 MED ORDER — IOHEXOL 300 MG/ML  SOLN
100.0000 mL | Freq: Once | INTRAMUSCULAR | Status: AC | PRN
  Administered 2018-07-05: 100 mL via INTRAVENOUS

## 2018-07-05 MED ORDER — ACETAMINOPHEN 325 MG PO TABS: 650.0000 mg | ORAL_TABLET | Freq: Four times a day (QID) | ORAL | Status: DC | PRN

## 2018-07-05 MED ORDER — TICAGRELOR 60 MG PO TABS
60.0000 mg | ORAL_TABLET | Freq: Two times a day (BID) | ORAL | Status: DC
  Administered 2018-07-05 – 2018-07-11 (×12): 60 mg via ORAL
  Filled 2018-07-05 (×15): qty 1

## 2018-07-05 MED ORDER — NITROGLYCERIN 0.4 MG SL SUBL: 0.4000 mg | SUBLINGUAL_TABLET | SUBLINGUAL | Status: DC | PRN

## 2018-07-05 MED ORDER — BUPROPION HCL ER (SR) 100 MG PO TB12
100.0000 mg | ORAL_TABLET | Freq: Every morning | ORAL | Status: DC
  Administered 2018-07-06 – 2018-07-11 (×6): 100 mg via ORAL
  Filled 2018-07-05 (×6): qty 1

## 2018-07-05 MED ORDER — METHYLPREDNISOLONE SODIUM SUCC 125 MG IJ SOLR
60.0000 mg | Freq: Four times a day (QID) | INTRAMUSCULAR | Status: DC
  Administered 2018-07-05 – 2018-07-08 (×13): 60 mg via INTRAVENOUS
  Filled 2018-07-05 (×14): qty 2

## 2018-07-05 MED ORDER — FOLIC ACID 1 MG PO TABS
1.0000 mg | ORAL_TABLET | Freq: Every day | ORAL | Status: DC
  Administered 2018-07-06 – 2018-07-11 (×6): 1 mg via ORAL
  Filled 2018-07-05 (×6): qty 1

## 2018-07-05 MED ORDER — ARFORMOTEROL TARTRATE 15 MCG/2ML IN NEBU
15.0000 ug | INHALATION_SOLUTION | Freq: Two times a day (BID) | RESPIRATORY_TRACT | Status: DC
  Administered 2018-07-05 – 2018-07-11 (×13): 15 ug via RESPIRATORY_TRACT
  Filled 2018-07-05 (×14): qty 2

## 2018-07-05 MED ORDER — CITALOPRAM HYDROBROMIDE 20 MG PO TABS
10.0000 mg | ORAL_TABLET | Freq: Every day | ORAL | Status: DC
  Administered 2018-07-06 – 2018-07-11 (×6): 10 mg via ORAL
  Filled 2018-07-05 (×9): qty 1

## 2018-07-05 MED ORDER — SODIUM CHLORIDE 0.9 % IV SOLN
1.0000 g | INTRAVENOUS | Status: DC
  Administered 2018-07-05 – 2018-07-08 (×4): 1 g via INTRAVENOUS
  Filled 2018-07-05 (×4): qty 10

## 2018-07-05 MED ORDER — ONDANSETRON HCL 4 MG PO TABS: 4.0000 mg | ORAL_TABLET | Freq: Four times a day (QID) | ORAL | Status: DC | PRN

## 2018-07-05 MED ORDER — LEVOTHYROXINE SODIUM 25 MCG PO TABS
125.0000 ug | ORAL_TABLET | Freq: Every day | ORAL | Status: DC
  Administered 2018-07-06 – 2018-07-11 (×6): 125 ug via ORAL
  Filled 2018-07-05 (×6): qty 1

## 2018-07-05 MED ORDER — ACETAMINOPHEN 650 MG RE SUPP: 650.0000 mg | Freq: Four times a day (QID) | RECTAL | Status: DC | PRN

## 2018-07-05 MED ORDER — HEPARIN (PORCINE) 25000 UT/250ML-% IV SOLN
1050.0000 [IU]/h | INTRAVENOUS | Status: DC
  Administered 2018-07-05 – 2018-07-07 (×3): 850 [IU]/h via INTRAVENOUS
  Filled 2018-07-05 (×3): qty 250

## 2018-07-05 MED ORDER — LEVOTHYROXINE SODIUM 125 MCG PO TABS: 125.0000 ug | ORAL_TABLET | Freq: Every day | ORAL | Status: DC

## 2018-07-05 NOTE — Consult Note (Addendum)
Cardiology Consult    Patient ID: [name]; [id number]; 05-11-1950   Admit date: 07/04/2018 Date of Consult: 07/05/2018  Primary Care Provider: [name], NP Primary Cardiologist: Kate Sable, MD    Patient Profile    [name] is a 68 y.o. female with past medical history of CAD (s/p PCI to RCA with both ISR in 2003 and requiring thrombectomy in 2012, cath in 2013 showing a patent stented segment in the RCA with mild ISR and minor nonobstructive CAD along the LM, LAD, and LCx), Factor V Leiden deficiency, history of PE, HTN, HLD, and COPD who is being seen today for the evaluation of elevated troponin values at the request of Dr. Darrick Meigs.   History of Present Illness    Ms. Corzine was last examined by Kerin Ransom, PA-C on 05/2017 and had baseline dyspnea on exertion but denied any chest pain. Was requiring spinal injections and was planning to undergo Lovenox bridging for the procedure. She has not followed up with Cardiology since.   She was most recently admitted from 5/14 - 05/29/2018 for acute on chronic hypoxic respiratory failure in the setting of a COPD exacerbation. Was appropriately treated with IV steroids and scheduled nebulizer therapy. She did experience acute blood loss anemia during admission and received 2 units PRBCs. EGD was performed and nonrevealing. Colonoscopy showed a 7 mm polyp and 2 clips were applied to achieve hemostasis. Also noted to have hemorrhoids and diverticulosis. Was recommended she go to SNF at the time of discharge but she refused and returned home with Home Health RN and PT. Was restarted on Coumadin at the time of discharge with Lovenox bridge.   She presented to [facility] ED on 07/04/2018 for evaluation of hematemesis.By review of notes, a neighbor called EMS as she had been having hematemesis and a decreased level of consciousness. Has baseline dyspnea on exertion but no recent reports of chest pain or palpitations. She required  a NRB while in transit per EMS report and was placed on BiPAP in the ED.   Initial labs showed WBC 10.5, Hgb 10.2, platelets 192, Na+ 145, K+ 4.0, and creatinine 0.75. COVID testing negative. BNP 117. ABG showed pH 7.252, pCO2 97.7, and BiCarb 36. Initial HS Troponin 1031 with repeat values of 879 and 894. BNP 117.CXR showed stable pleural scarring along the bases and possible atelectasis or PNA. CT Abdomen showed no acute findings but was noted to have a 3 cm infrarenal aortic aneurysm with repeat imaging recommended in 3 years. EKG shows sinus tachycardia, HR 102, with IVCD and TWI along the inferior leads which is similar to prior tracings.    In examining the patient today, she remains on BiPAP. Opens her eyes when spoken to but quickly goes back to sleep. Reviewed with patient's nurse who reports similar findings earlier today. She is again being treated for a COPD exacerbation. Coumadin was held at the time of admission due to reports of hematemesis but no evidence of this since admission. INR subtherapeutic at 1.6 on 6/25.   Past Medical History:  Diagnosis Date  . Acute MI ([place])    x4, code blue x3  . Arteriosclerotic cardiovascular disease (ASCVD) 2002   Inf STEMI-2002. 2003-cutting balloon + brachytherapy for restenosis; subsequent acute stent thrombosis 06/2010 requiring 2 separate interventions (Zeta stent, then repeat cath with thrombectomy). focal basal inf AK, nl EF; 03/2011: Patent stents, minor nonobst  residual dz, nl EF; neg stress nuclear in 2008 and stress echo in 2009  .  Chronic anticoagulation    Warfarin plus ticagrelor  . Chronic respiratory failure ([place]) 08/27/2013   On 2L 02  . COPD (chronic obstructive pulmonary disease) ([place])    02 dependent  . COPD (chronic obstructive pulmonary disease) ([place])   . Factor 5 Leiden mutation, heterozygous ([place])   . Factor V Leiden, prothrombin gene mutation ([place]) 2006  . Hyperlipidemia   . Hypothyroidism   . Noncompliance   . Pelvic  fracture ([place]) 2009  . Pulmonary embolism ([place]) 2006   Associated with deep vein thrombosis-2006; + factor V Leiden  . Tobacco abuse    50 pack years    Past Surgical History:  Procedure Laterality Date  . COLONOSCOPY  Approximately 2000   Negative screening study  . COLONOSCOPY WITH PROPOFOL N/A 05/28/2018   Procedure: COLONOSCOPY WITH PROPOFOL;  Surgeon: Rogene Houston, MD;  Location: [facility];  Service: Endoscopy;  Laterality: N/A;  . CORONARY ANGIOPLASTY  2002, 2003, 2012  . ESOPHAGOGASTRODUODENOSCOPY (EGD) WITH PROPOFOL N/A 05/26/2018   Procedure: ESOPHAGOGASTRODUODENOSCOPY (EGD) WITH PROPOFOL;  Surgeon: Rogene Houston, MD;  Location: [facility];  Service: Endoscopy;  Laterality: N/A;  . LEFT AND RIGHT HEART CATHETERIZATION WITH CORONARY ANGIOGRAM N/A 04/03/2011   Procedure: LEFT AND RIGHT HEART CATHETERIZATION WITH CORONARY ANGIOGRAM;  Surgeon: Sherren Mocha, MD;  Location: [facility] CATH LAB;  Service: Cardiovascular;  Laterality: N/A;  . POLYPECTOMY  05/28/2018   Procedure: POLYPECTOMY;  Surgeon: Rogene Houston, MD;  Location: [facility];  Service: Endoscopy;;  cold snare and biopsy forcep     Home Medications:  Prior to Admission medications   Medication Sig Start Date End Date Taking? Authorizing Provider  albuterol (PROVENTIL) (2.5 MG/3ML) 0.083% nebulizer solution USE 1 VIAL BY NEBULIZER EVERY 4 HOURS AS NEEDED FOR WHEEZING. DX: J44.9 Patient taking differently: Take 2.5 mg by nebulization every 4 (four) hours as needed for wheezing or shortness of breath.  01/24/17   Magdalen Spatz, NP  arformoterol (BROVANA) 15 MCG/2ML NEBU Take 2 mLs (15 mcg total) by nebulization 2 (two) times daily. 09/07/17   Juanito Doom, MD  baclofen (LIORESAL) 10 MG tablet Take 1 tablet (10 mg total) by mouth 3 (three) times daily. 01/24/18   Lavina Hamman, MD  bisacodyl (DULCOLAX) 5 MG EC tablet Take 1 tablet (5 mg total) by mouth daily as needed for mild constipation. 05/29/18   Manuella Ghazi,  Pratik D, DO  budesonide (PULMICORT) 0.5 MG/2ML nebulizer solution Take 2 mLs (0.5 mg total) by nebulization 2 (two) times daily. Dx: J43.9 01/24/17   Magdalen Spatz, NP  buPROPion (WELLBUTRIN SR) 100 MG 12 hr tablet Take 100 mg by mouth every morning.    [provider]  citalopram (CELEXA) 10 MG tablet Take 1 tablet by mouth daily. 04/29/18   [provider]  enoxaparin (LOVENOX) 80 MG/0.8ML injection Inject 1 mL (100 mg total) into the skin daily. 05/29/18 05/29/19  Manuella Ghazi, Pratik D, DO  fentaNYL (DURAGESIC) 50 MCG/HR Place 1 patch onto the skin every 3 (three) days. 02/19/18   Johnson, Clanford L, MD  ferrous sulfate 324 (65 Fe) MG TBEC Take 1 tablet (325 mg total) by mouth daily with breakfast. 02/21/18   Wynetta Emery, Clanford L, MD  folic acid (FOLVITE) 1 MG tablet Take 1 tablet (1 mg total) by mouth daily. 02/21/18 02/21/19  Johnson, Clanford L, MD  HYDROcodone-acetaminophen (NORCO/VICODIN) 5-325 MG tablet Take 1 tablet by mouth every 6 (six) hours as needed (BREAKTHROUGH PAIN). 02/19/18  Johnson, Clanford L, MD  ipratropium-albuterol (DUONEB) 0.5-2.5 (3) MG/3ML SOLN Take 3 mLs by nebulization 4 (four) times daily. 03/20/17   Magdalen Spatz, NP  levothyroxine (SYNTHROID, LEVOTHROID) 125 MCG tablet Take 1 tablet (125 mcg total) by mouth daily. 08/16/17   Nafziger, Tommi Rumps, NP  lidocaine (LIDODERM) 5 % Place 1 patch onto the skin daily. Remove & Discard patch within 12 hours or as directed by MD Patient not taking: Reported on 05/23/2018 01/24/18   Lavina Hamman, MD  nitroGLYCERIN (NITROSTAT) 0.4 MG SL tablet Place 1 tablet (0.4 mg total) under the tongue every 5 (five) minutes as needed for chest pain. 10/28/14   Herminio Commons, MD  Omega-3 Fatty Acids (FISH OIL) 600 MG CAPS Take 1 capsule by mouth daily.     [provider]  pantoprazole (PROTONIX) 40 MG tablet Take 1 tablet (40 mg total) by mouth daily. 02/20/18   Johnson, Clanford L, MD  polyethylene glycol (MIRALAX / GLYCOLAX)  packet Take 17 g by mouth daily. Patient not taking: Reported on 02/15/2018 01/25/18   Lavina Hamman, MD  Respiratory Therapy Supplies (FLUTTER) DEVI Use as directed 05/14/15   Javier Glazier, MD  ticagrelor (BRILINTA) 60 MG TABS tablet Take 1 tablet (60 mg total) by mouth 2 (two) times daily. 02/01/18   Johnson, Clanford L, MD  traZODone (DESYREL) 50 MG tablet Take 50 mg by mouth at bedtime as needed for sleep.    [provider]  warfarin (COUMADIN) 1 MG tablet Take 1 mg by mouth See admin instructions. Alternates taking 6mg  with 5mg  daily. Takes 5mg  on Mondays, Wednesdays, and Fridays. Takes 6mg  on all other days    [provider]    Inpatient Medications: Scheduled Meds: . arformoterol  15 mcg Nebulization BID  . budesonide (PULMICORT) nebulizer solution  0.5 mg Nebulization BID  . buPROPion  100 mg Oral q morning - 10a  . citalopram  10 mg Oral Daily  . ferrous sulfate  325 mg Oral Q breakfast  . folic acid  1 mg Oral Daily  . ipratropium-albuterol  3 mL Nebulization Q6H  . levothyroxine  125 mcg Oral Q0600  . methylPREDNISolone (SOLU-MEDROL) injection  60 mg Intravenous Q6H  . ticagrelor  60 mg Oral BID   Continuous Infusions: . sodium chloride 125 mL/hr at 07/05/18 0511  . cefTRIAXone (ROCEPHIN)  IV Stopped (07/05/18 0511)   PRN Meds: acetaminophen **OR** acetaminophen, albuterol, nitroGLYCERIN, ondansetron **OR** ondansetron (ZOFRAN) IV  Allergies:    Allergies  Allergen Reactions  . Dilaudid [Hydromorphone Hcl] Hives and Nausea Only  . Minocycline Hcl     REACTION: Dizzy  . Prednisone     REACTION: feels like throat swelling, hallucinations  . Varenicline Tartrate     REACTION: Dizzy(chantix)   . Zocor [Simvastatin - High Dose] Other (See Comments)    myalgia    Social History:   Social History   Socioeconomic History  . Marital status: Single    Spouse name: Not on file  . Number of children: Not on file  . Years of education: Not on file   . Highest education level: Not on file  Occupational History  . Occupation: Disabled  . 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 Financial resource strain: Patient refused  . Food insecurity    Worry: Patient refused    Inability: Patient refused  . Transportation needs    Medical: Patient refused    Non-medical: Patient refused  Tobacco Use  .  Smoking status: Current Some Day Smoker    Packs/day: 0.50    Years: 50.00    Pack years: 25.00    Types: Cigarettes    Start date: 05/10/1968  . Smokeless tobacco: Never Used  Substance and Sexual Activity  . Alcohol use: No    Alcohol/week: 0.0 standard drinks  . Drug use: No  . Sexual activity: Not on file  Lifestyle  . Physical activity    Days per week: Patient refused    Minutes per session: Patient refused  . Stress: Not on file  Relationships  . Social Herbalist on phone: Patient refused    Gets together: Patient refused    Attends religious service: Patient refused    Active member of club or organization: Patient refused    Attends meetings of clubs or organizations: Patient refused    Relationship status: Patient refused  . Intimate partner violence    Fear of current or ex partner: Patient refused    Emotionally abused: Patient refused    Physically abused: Patient refused    Forced sexual activity: Patient refused  Other Topics Concern  . Not on file  Social History Narrative   Originally from [place]. Previously has lived in [place], [place], [place] & moved back to [place] in 1994. 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 Ronie Spies was in her current home. She also had an owl living in her house as a rehab pet. She also had chickens until Fall 2015. No mold exposure.      Family History:    Family History  Problem Relation Age of Onset  . Emphysema Mother   . Alzheimer's disease Mother   . Emphysema Sister   . Heart disease Father   .  Factor V Leiden deficiency Father   . Factor V Leiden deficiency Sister   . Factor V Leiden deficiency Daughter   . Kidney cancer Paternal Grandmother       Review of Systems    Unable to be obtained. Currently on BiPAP.  Physical Exam/Data    Vitals:   07/05/18 0930 07/05/18 0945 07/05/18 1000 07/05/18 1030  BP: (!) 104/46 (!) 111/49 (!) 87/36 (!) 97/39  Pulse: 69 68 71 69  Resp: 18 17 15 16   Temp:      TempSrc:      SpO2: 100% 100% 100% 100%    Intake/Output Summary (Last 24 hours) at 07/05/2018 1119 Last data filed at 07/05/2018 0800 Gross per 24 hour  Intake 0 ml  Output -  Net 0 ml   There were no vitals filed for this visit. There is no height or weight on file to calculate BMI.   General: Elderly Caucasian female currently on BiPAP. Psych: Normal affect. Neuro: Moves all extremities spontaneously. Open eyes then falls back asleep.  HEENT: Normal  Neck: Supple without bruits or JVD. Lungs:  Resp regular and unlabored, scattered rhonchi throughout. Heart: RRR no s3, s4, or murmurs. Abdomen: Soft, non-tender, non-distended, BS + x 4.  Extremities: No clubbing or cyanosis. 1+ pitting edema bilaterally. DP/PT/Radials 2+ and equal bilaterally.   EKG:  The EKG was personally reviewed and demonstrates: Sinus tachycardia, HR 102, with IVCD and TWI along the inferior leads which is similar to prior tracings.    Labs/Studies     Relevant CV Studies:  Cardiac Catheterization: 2013 Left anterior descending (LAD): The LAD is widely  patent to the left ventricular apex. There is minor irregularity in the proximal and mid vessel. There is a small first diagonal branch with no significant stenosis. The septal perforators are widely patent.  Left circumflex (LCx): The left circumflex is patent. There is a moderate caliber obtuse marginal branch with a high origin with mild irregularity and up to 50% stenosis. The AV groove circumflex has 30-40% stenosis in the midportion.  The second obtuse marginal branch has no stenosis.  Right coronary artery (RCA): The right coronary artery is a large, dominant vessel. The overlapping stent layers in the proximal vessel are patent. There is mild in-stent restenosis at the ostium of the vessel of 30%. There is no significant pressure dampening with the catheter into the proximal right coronary artery. In the distal portion of the stented segment there is another smooth area of in-stent restenosis up to 30%. The remaining portions of the right coronary artery are now widely patent. The PDA branch and posterolateral branches are widely patent with no significant stenosis.  Left ventriculography: Overall LV function is preserved. The estimated left ventricular ejection fraction is 55-60%. There is focal akinesis of the basal inferior wall.  Final Conclusions:   1. Patent stented segment in the right coronary artery with mild in-stent restenosis 2. Minor nonobstructive plaque in the left main, LAD, and left circumflex. 3. Mild left ventricular segmental contraction abnormality with overall preserved LV function  Echocardiogram: 02/2017 Study Conclusions  - Left ventricle: The cavity size was normal. Wall thickness was   increased in a pattern of mild LVH. Systolic function was normal.   The estimated ejection fraction was in the range of 55% to 60%.   Wall motion was normal; there were no regional wall motion   abnormalities. Features are consistent with a pseudonormal left   ventricular filling pattern, with concomitant abnormal relaxation   and increased filling pressure (grade 2 diastolic dysfunction).   Doppler parameters are consistent with high ventricular filling   pressure. - Mitral valve: There was mild regurgitation. - Pericardium, extracardiac: A trivial pericardial effusion was   identified.  Laboratory Data:  Chemistry Recent Labs  Lab 07/04/18 2030 07/05/18 0427  NA 145 145  K 4.0 4.0  CL 97* 102   CO2 38* 34*  GLUCOSE 99 75  BUN 20 19  CREATININE 0.75 0.60  CALCIUM 8.9 8.1*  GFRNONAA >60 >60  GFRAA >60 >60  ANIONGAP 10 9    Recent Labs  Lab 07/04/18 2030 07/05/18 0427  PROT 6.7 5.4*  ALBUMIN 3.4* 2.8*  AST 26 23  ALT 12 11  ALKPHOS 88 71  BILITOT 0.6 0.5   Hematology Recent Labs  Lab 07/04/18 2030 07/05/18 0427  WBC 10.5 8.3  RBC 3.49* 2.97*  HGB 10.2* 8.4*  HCT 34.9* 29.9*  MCV 100.0 100.7*  MCH 29.2 28.3  MCHC 29.2* 28.1*  RDW 15.8* 15.8*  PLT 192 145*   Cardiac EnzymesNo results for input(s): TROPONINI in the last 168 hours. No results for input(s): TROPIPOC in the last 168 hours.  BNP Recent Labs  Lab 07/04/18 2020  BNP 117.0*    DDimer No results for input(s): DDIMER in the last 168 hours.  Radiology/Studies:  Ct Abdomen Pelvis W Contrast  Result Date: 07/05/2018 CLINICAL DATA:  Nausea and vomiting.  Hematemesis. EXAM: CT ABDOMEN AND PELVIS WITH CONTRAST TECHNIQUE: Multidetector CT imaging of the abdomen and pelvis was performed using the standard protocol following bolus administration of intravenous contrast. CONTRAST:  124mL OMNIPAQUE  IOHEXOL 300 MG/ML  SOLN COMPARISON:  None similar FINDINGS: Lower chest: Extensive opacity in the left lower lobe superimposed on curvilinear subpleural scarring. Hepatobiliary: No focal liver abnormality.No evidence of biliary obstruction or stone. Pancreas: Unremarkable. Spleen: Unremarkable. Adrenals/Urinary Tract: Negative adrenals. No hydronephrosis or ureteral stone. There are 2 small left renal calculi. Unremarkable bladder. Stomach/Bowel: No obstruction. No appendicitis. Two hemoclips present at the level of the rectum. Sigmoid diverticulosis. Vascular/Lymphatic: No acute vascular abnormality. Atherosclerosis with fusiform dilatation of the infrarenal aorta measuring up to 3 cm. No mass or adenopathy. Reproductive:Fibroid uterus. Discrete fibroids are difficult to discretely visualize, but the largest is likely 4 cm  in the intramural right body. Other: No ascites or pneumoperitoneum. Musculoskeletal: No acute abnormalities. Remote L1, L3, and L5 superior endplate fractures. IMPRESSION: 1. Partially visualized left lower lobe opacity likely infectious/inflammatory given this area was relatively clear on recent chest imaging. Recommend follow-up to clearing. 2. No acute finding in the abdomen. 3. 3 cm infrarenal aortic aneurysm. Recommend followup by ultrasound in 3 years. This recommendation follows ACR consensus guidelines: White Paper of the ACR Incidental Findings Committee II on Vascular Findings. J Am Coll Radiol 2013; 62:229-798 4. Sigmoid diverticulosis and left nephrolithiasis. Electronically Signed   By: Monte Fantasia M.D.   On: 07/05/2018 06:23   Dg Chest Portable 1 View  Result Date: 07/04/2018 CLINICAL DATA:  Shortness of breath EXAM: PORTABLE CHEST 1 VIEW COMPARISON:  05/23/2018, 02/15/2018, 09/19/2017 FINDINGS: Mild cardiomegaly. Stable scarring at the bilateral lung bases. Slight increase retrocardiac opacity. Aortic atherosclerosis. No pneumothorax. IMPRESSION: 1. Stable pleural scarring at the bases. Slight increased left retrocardiac opacity, possible atelectasis or mild pneumonia 2. Cardiomegaly Electronically Signed   By: Donavan Foil M.D.   On: 07/04/2018 21:40     Assessment & Plan    1. Elevated Troponin Values in the Setting of Known CAD - she has known CAD and is s/p PCI to RCA with both ISR in 2003 and requiring thrombectomy in 2012. Most recent cath in 2013 showed a patent stented segment in the RCA with mild ISR and minor nonobstructive CAD along the LM, LAD, and LCx. - presented with worsening dyspnea and hematemesis. No reports of recent chest pain but patient unable to contribute to history at this time.  -  Initial HS Troponin 1031 with repeat values of 879 and 894. Troponin I values were negative last admission. EKG shows sinus tachycardia, HR 102, with IVCD and TWI along the  inferior leads which is similar to prior tracings.   - would obtain an echocardiogram for initial assessment of LV function and wall motion. Pending improvement in her respiratory status and once able to obtain a complete history, would consider further ischemic evaluation next week. Given her known CAD by cath in 2013, continued tobacco use, and refusing statin therapy, she has certainly had progression of her disease since then but she would be high-risk for stenting given the above and her chronic anemia.  - remains on Brilinta at this time. Not on BB given COPD and has refused statin therapy. Consider initiation of Heparin as outlined below.   2. Factor V Deficiency  - with known history of PE and DVT. - was recently discharged on Lovenox bridge with Coumadin but did not attend her Coumadin follow-up visits. INR sub-therapeutic at 1.6 on admission.  - given no evidence of active bleeding, need to consider starting Heparin for now until it can be determined no further invasive procedures will be indicated this  admission.   3. Chronic Anemia - experienced acute blood loss anemia in 05/2018 and received 2 units PRBCs. EGD was nonrevealing. Colonoscopy showed a 7 mm polyp and 2 clips were applied to achieve hemostasis. Also noted to have hemorrhoids and diverticulosis.  - Hgb 10.2 on admission, down to 8.4 today. Will need to follow closely given the need for anticoagulation due to #2.   4. HTN -  She has been hypotensive this admission, at 77/26 - 148/96 within the past 24 hours. Stable at 97/39 on most recent check. - not listed as being on anti-hypertensive medications as an outpatient.   5. Acute on Chronic Hypoxic Respiratory Failure/ COPD Exacerbation - ABG on admission showed pH 7.252, pCO2 97.7, and BiCarb 36. CXR showed stable pleural scarring along the bases and possible atelectasis or PNA. BNP 117. - treatment for COPD exacerbation per admitting team. Currently on BiPAP.     For  questions or updates, please contact [place] Please consult [email or web address] for contact info under Cardiology/STEMI.  Signed, Erma Heritage, PA-C 07/05/2018, 11:19 AM Pager: [phone]  The patient was seen and examined, and I agree with the history, physical exam, assessment and plan as documented above, with modifications as noted below. I have also personally reviewed all relevant documentation, old records, labs, and both radiographic and cardiovascular studies. I have also independently interpreted old and new ECG's.  Briefly, this is a 68 year old woman who whom I personally last evaluated in May 2017.  She has a history of coronary disease with multiple RCA interventions.  She has been treated with Brilinta as she was a Plavix nonresponder.  She has factor V Leiden deficiency with a history of pulmonary embolism and DVT as well as COPD.  She was hospitalized in May of this year for a COPD exacerbation.  Details are noted above including GI history and colonoscopy results.  She presented to the ED yesterday with hematemesis and diminished consciousness.  There were no mention of chest pain.  When I interrogated her today she was able to tell me that she was not having any chest pain.  Labs reviewed above with elevated high-sensitivity troponins.    She is currently being treated for a COPD exacerbation.  Echocardiogram from February 2019 reviewed above with normal left ventricular systolic function and grade 2 diastolic dysfunction.  A review of ECG shows no significant changes since prior ECGs.  Currently on Brilinta.  She was also on warfarin with a subtherapeutic INR of 1.6 at the time of presentation.  Hemoglobin has shown a significant drop from 10.2 to 8.4.  I would recommend starting IV heparin with close monitoring of hemoglobin given her elevated troponin and CAD (although could be due to demand ischemia given Hgb drop and COPD exacerbation) and history of DVT  and PE with factor V Leiden deficiency.  I would recommend echocardiography for assessment of interval changes in cardiac structure and function.  Depending upon her respiratory status next week, and inpatient ischemic evaluation could be considered.  That being said, she would be high risk for PCI given chronic anemia with recent drop in Hgb.   Kate Sable, MD, [facility]  07/05/2018 12:21 PM

## 2018-07-05 NOTE — H&P (Addendum)
TRH H&P    Patient Demographics:    [name], is a 68 y.o. female  MRN: [id number]  DOB - 12/17/1950  Admit Date - 07/04/2018  Referring MD/NP/PA: Lily Kocher  Outpatient Primary MD for the patient is [name], NP  Patient coming from: Home  Chief complaint-shortness of breath/?  Hematemesis   HPI:    [name]  is a 68 y.o. 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  Patient is currently on BiPAP and is unable to provide any history.  As per ED notes EMS was called by patient's friend today as she was having hematemesis.  Patient has not been alert and awake over past few days.  EMS reported that patient had periods of apnea en route to ED.  O2 sats dropped to 83% and patient was put on nonrebreather mask. ABG showed pH 7.252, PCO2 97.7, PO2 316 bicarb 36.1. Patient was put on BiPAP. SARS coronavirus 2 is negative She denies chest pain.  Denies nausea vomiting. Stool guaiac in the ED is negative.  Patient had full GI work-up during previous admission last month.  She underwent both EGD and colonoscopy.  There was finding of hemorrhoids and diverticulosis.  Patient's hemoglobin today is 10.2 which is in fact improved from previous hemoglobin of 8.9 on May 29, 2018.      Review of systems:    As per HPI, rest of review systems unobtainable as patient is on BiPAP and unable to provide significant history.     Past History of the following :    Past Medical History:  Diagnosis Date  . Acute MI ([place])    x4, code blue x3  . Arteriosclerotic cardiovascular disease (ASCVD) 2002   Inf STEMI-2002. 2003-cutting balloon + brachytherapy for restenosis; subsequent  acute stent thrombosis 06/2010 requiring 2 separate interventions (Zeta stent, then repeat cath with thrombectomy). focal basal inf AK, nl EF; 03/2011: Patent stents, minor nonobst  residual dz, nl EF; neg stress nuclear in 2008 and stress echo in 2009  . Chronic anticoagulation    Warfarin plus ticagrelor  . Chronic respiratory failure ([place]) 08/27/2013   On 2L 02  . COPD (chronic obstructive pulmonary disease) ([place])    02 dependent  . COPD (chronic obstructive pulmonary disease) ([place])   . Factor 5 Leiden mutation, heterozygous ([place])   . Factor V Leiden, prothrombin gene mutation ([place]) 2006  . Hyperlipidemia   . Hypothyroidism   . Noncompliance   . Pelvic fracture ([place]) 2009  . Pulmonary embolism ([place]) 2006   Associated with deep vein thrombosis-2006; + factor V Leiden  . Tobacco abuse    50 pack years      Past Surgical History:  Procedure Laterality Date  . COLONOSCOPY  Approximately 2000   Negative screening study  . COLONOSCOPY WITH PROPOFOL N/A 05/28/2018   Procedure: COLONOSCOPY WITH PROPOFOL;  Surgeon: Laural Golden,  Mechele Dawley, MD;  [facility];  Service: Endoscopy;  Laterality: N/A;  . CORONARY ANGIOPLASTY  2002, 2003, 2012  . ESOPHAGOGASTRODUODENOSCOPY (EGD) WITH PROPOFOL N/A 05/26/2018   Procedure: ESOPHAGOGASTRODUODENOSCOPY (EGD) WITH PROPOFOL;  Surgeon: Rogene Houston, MD;  Location: [facility];  Service: Endoscopy;  Laterality: N/A;  . LEFT AND RIGHT HEART CATHETERIZATION WITH CORONARY ANGIOGRAM N/A 04/03/2011   Procedure: LEFT AND RIGHT HEART CATHETERIZATION WITH CORONARY ANGIOGRAM;  Surgeon: Sherren Mocha, MD;  Location: [facility] CATH LAB;  Service: Cardiovascular;  Laterality: N/A;  . POLYPECTOMY  05/28/2018   Procedure: POLYPECTOMY;  Surgeon: Rogene Houston, MD;  Location: [facility];  Service: Endoscopy;;  cold snare and biopsy forcep      Social History:      Social History   Tobacco Use  . Smoking status: Current Some Day Smoker    Packs/day: 0.50     Years: 50.00    Pack years: 25.00    Types: Cigarettes    Start date: 05/10/1968  . Smokeless tobacco: Never Used  Substance Use Topics  . Alcohol use: No    Alcohol/week: 0.0 standard drinks       Family History :     Family History  Problem Relation Age of Onset  . Emphysema Mother   . Alzheimer's disease Mother   . Emphysema Sister   . Heart disease Father   . Factor V Leiden deficiency Father   . Factor V Leiden deficiency Sister   . Factor V Leiden deficiency Daughter   . Kidney cancer Paternal Grandmother       Home Medications:   Prior to Admission medications   Medication Sig Start Date End Date Taking? Authorizing Provider  albuterol (PROVENTIL) (2.5 MG/3ML) 0.083% nebulizer solution USE 1 VIAL BY NEBULIZER EVERY 4 HOURS AS NEEDED FOR WHEEZING. DX: J44.9 Patient taking differently: Take 2.5 mg by nebulization every 4 (four) hours as needed for wheezing or shortness of breath.  01/24/17   Magdalen Spatz, NP  arformoterol (BROVANA) 15 MCG/2ML NEBU Take 2 mLs (15 mcg total) by nebulization 2 (two) times daily. 09/07/17   Juanito Doom, MD  baclofen (LIORESAL) 10 MG tablet Take 1 tablet (10 mg total) by mouth 3 (three) times daily. 01/24/18   Lavina Hamman, MD  bisacodyl (DULCOLAX) 5 MG EC tablet Take 1 tablet (5 mg total) by mouth daily as needed for mild constipation. 05/29/18   Manuella Ghazi, Pratik D, DO  budesonide (PULMICORT) 0.5 MG/2ML nebulizer solution Take 2 mLs (0.5 mg total) by nebulization 2 (two) times daily. Dx: J43.9 01/24/17   Magdalen Spatz, NP  buPROPion (WELLBUTRIN SR) 100 MG 12 hr tablet Take 100 mg by mouth every morning.    [provider]  citalopram (CELEXA) 10 MG tablet Take 1 tablet by mouth daily. 04/29/18   [provider]  enoxaparin (LOVENOX) 80 MG/0.8ML injection Inject 1 mL (100 mg total) into the skin daily. 05/29/18 05/29/19  Manuella Ghazi, Pratik D, DO  fentaNYL (DURAGESIC) 50 MCG/HR Place 1 patch onto the skin every 3 (three) days.  02/19/18   Johnson, Clanford L, MD  ferrous sulfate 324 (65 Fe) MG TBEC Take 1 tablet (325 mg total) by mouth daily with breakfast. 02/21/18   Wynetta Emery, Clanford L, MD  folic acid (FOLVITE) 1 MG tablet Take 1 tablet (1 mg total) by mouth daily. 02/21/18 02/21/19  Murlean Iba, MD  HYDROcodone-acetaminophen (NORCO/VICODIN) 5-325 MG tablet Take 1 tablet by mouth every 6 (  six) hours as needed (BREAKTHROUGH PAIN). 02/19/18   Johnson, Clanford L, MD  ipratropium-albuterol (DUONEB) 0.5-2.5 (3) MG/3ML SOLN Take 3 mLs by nebulization 4 (four) times daily. 03/20/17   Magdalen Spatz, NP  levothyroxine (SYNTHROID, LEVOTHROID) 125 MCG tablet Take 1 tablet (125 mcg total) by mouth daily. 08/16/17   Nafziger, Tommi Rumps, NP  lidocaine (LIDODERM) 5 % Place 1 patch onto the skin daily. Remove & Discard patch within 12 hours or as directed by MD Patient not taking: Reported on 05/23/2018 01/24/18   Lavina Hamman, MD  nitroGLYCERIN (NITROSTAT) 0.4 MG SL tablet Place 1 tablet (0.4 mg total) under the tongue every 5 (five) minutes as needed for chest pain. 10/28/14   Herminio Commons, MD  Omega-3 Fatty Acids (FISH OIL) 600 MG CAPS Take 1 capsule by mouth daily.     [provider]  pantoprazole (PROTONIX) 40 MG tablet Take 1 tablet (40 mg total) by mouth daily. 02/20/18   Johnson, Clanford L, MD  polyethylene glycol (MIRALAX / GLYCOLAX) packet Take 17 g by mouth daily. Patient not taking: Reported on 02/15/2018 01/25/18   Lavina Hamman, MD  Respiratory Therapy Supplies (FLUTTER) DEVI Use as directed 05/14/15   Javier Glazier, MD  ticagrelor (BRILINTA) 60 MG TABS tablet Take 1 tablet (60 mg total) by mouth 2 (two) times daily. 02/01/18   Johnson, Clanford L, MD  traZODone (DESYREL) 50 MG tablet Take 50 mg by mouth at bedtime as needed for sleep.    [provider]  warfarin (COUMADIN) 1 MG tablet Take 1 mg by mouth See admin instructions. Alternates taking 6mg  with 5mg  daily. Takes 5mg  on Mondays,  Wednesdays, and Fridays. Takes 6mg  on all other days    [provider]     Allergies:     Allergies  Allergen Reactions  . Dilaudid [Hydromorphone Hcl] Hives and Nausea Only  . Minocycline Hcl     REACTION: Dizzy  . Prednisone     REACTION: feels like throat swelling, hallucinations  . Varenicline Tartrate     REACTION: Dizzy(chantix)   . Zocor [Simvastatin - High Dose] Other (See Comments)    myalgia     Physical Exam:   Vitals  Blood pressure 98/76, pulse 80, temperature 97.6 F (36.4 C), temperature source Oral, resp. rate 17, SpO2 100 %.  1.  General: Patient currently on BiPAP  2. Psychiatric: Somnolent, but arousable, on BiPAP  3. Neurologic: Somnolent, arousable, answering questions appropriately.  Rest of neurological examination could not be completed.  4. HEENMT:  Atraumatic normocephalic  5. Respiratory : Bilateral rhonchi auscultated  6. Cardiovascular : S1-S2, regular, no murmur auscultated  7. Gastrointestinal:  Abdominal soft, nontender, no organomegaly  8.  Skin Bilateral lower extremities erythematous, warm to touch, right more than left.     Data Review:    CBC Recent Labs  Lab 07/04/18 2030  WBC 10.5  HGB 10.2*  HCT 34.9*  PLT 192  MCV 100.0  MCH 29.2  MCHC 29.2*  RDW 15.8*  LYMPHSABS 0.7  MONOABS 0.8  EOSABS 0.0  BASOSABS 0.0   ------------------------------------------------------------------------------------------------------------------  Results for orders placed or performed during the hospital encounter of 07/04/18 (from the past 48 hour(s))  SARS Coronavirus 2 (CEPHEID- Performed in [place] hospital lab), Hosp Order     Status: None   Collection Time: 07/04/18  8:16 PM   Specimen: Nasopharyngeal Swab  Result Value Ref Range   SARS Coronavirus 2 NEGATIVE NEGATIVE  Comment: (NOTE) If result is NEGATIVE SARS-CoV-2 target nucleic acids are NOT DETECTED. The SARS-CoV-2 RNA is generally detectable  in upper and lower  respiratory specimens during the acute phase of infection. The lowest  concentration of SARS-CoV-2 viral copies this assay can detect is 250  copies / mL. A negative result does not preclude SARS-CoV-2 infection  and should not be used as the sole basis for treatment or other  patient management decisions.  A negative result may occur with  improper specimen collection / handling, submission of specimen other  than nasopharyngeal swab, presence of viral mutation(s) within the  areas targeted by this assay, and inadequate number of viral copies  (<250 copies / mL). A negative result must be combined with clinical  observations, patient history, and epidemiological information. If result is POSITIVE SARS-CoV-2 target nucleic acids are DETECTED. The SARS-CoV-2 RNA is generally detectable in upper and lower  respiratory specimens dur ing the acute phase of infection.  Positive  results are indicative of active infection with SARS-CoV-2.  Clinical  correlation with patient history and other diagnostic information is  necessary to determine patient infection status.  Positive results do  not rule out bacterial infection or co-infection with other viruses. If result is PRESUMPTIVE POSTIVE SARS-CoV-2 nucleic acids MAY BE PRESENT.   A presumptive positive result was obtained on the submitted specimen  and confirmed on repeat testing.  While 2019 novel coronavirus  (SARS-CoV-2) nucleic acids may be present in the submitted sample  additional confirmatory testing may be necessary for epidemiological  and / or clinical management purposes  to differentiate between  SARS-CoV-2 and other Sarbecovirus currently known to infect humans.  If clinically indicated additional testing with an alternate test  methodology [phone]) is advised. The SARS-CoV-2 RNA is generally  detectable in upper and lower respiratory sp ecimens during the acute  phase of infection. The expected result is  Negative. Fact Sheet for Patients:  StrictlyIdeas.no Fact Sheet for Healthcare Providers: BankingDealers.co.za This test is not yet approved or cleared by the [place] FDA and has been authorized for detection and/or diagnosis of SARS-CoV-2 by FDA under an Emergency Use Authorization (EUA).  This EUA will remain in effect (meaning this test can be used) for the duration of the COVID-19 declaration under Section 564(b)(1) of the Act, 21 U.S.C. section 360bbb-3(b)(1), unless the authorization is terminated or revoked sooner. Performed at [facility], [address], [place], [place] [address]   Type and screen     Status: None   Collection Time: 07/04/18  8:30 PM  Result Value Ref Range   ABO/RH(D) A POS    Antibody Screen NEG    Sample Expiration      07/07/2018,2359 Performed at [facility], [address], [place], [place] [address]   Comprehensive metabolic panel     Status: Abnormal   Collection Time: 07/04/18  8:30 PM  Result Value Ref Range   Sodium 145 135 - 145 mmol/L   Potassium 4.0 3.5 - 5.1 mmol/L   Chloride 97 (L) 98 - 111 mmol/L   CO2 38 (H) 22 - 32 mmol/L   Glucose, Bld 99 70 - 99 mg/dL   BUN 20 8 - 23 mg/dL   Creatinine, Ser 0.75 0.44 - 1.00 mg/dL   Calcium 8.9 8.9 - 10.3 mg/dL   Total Protein 6.7 6.5 - 8.1 g/dL   Albumin 3.4 (L) 3.5 - 5.0 g/dL   AST 26 15 - 41 U/L   ALT 12 0 -  44 U/L   Alkaline Phosphatase 88 38 - 126 U/L   Total Bilirubin 0.6 0.3 - 1.2 mg/dL   GFR calc non Af Amer >60 >60 mL/min   GFR calc Af Amer >60 >60 mL/min   Anion gap 10 5 - 15    Comment: Performed at [facility], [address] [place], [place] [address]  Lipase, blood     Status: None   Collection Time: 07/04/18  8:30 PM  Result Value Ref Range   Lipase 28 11 - 51 U/L    Comment: Performed at [facility], [address] [place], [place] [address]  CBC with Differential     Status: Abnormal   Collection Time: 07/04/18  8:30  PM  Result Value Ref Range   WBC 10.5 4.0 - 10.5 K/uL   RBC 3.49 (L) 3.87 - 5.11 MIL/uL   Hemoglobin 10.2 (L) 12.0 - 15.0 g/dL   HCT 34.9 (L) 36.0 - 46.0 %   MCV 100.0 80.0 - 100.0 fL   MCH 29.2 26.0 - 34.0 pg   MCHC 29.2 (L) 30.0 - 36.0 g/dL   RDW 15.8 (H) 11.5 - 15.5 %   Platelets 192 150 - 400 K/uL   nRBC 0.0 0.0 - 0.2 %   Neutrophils Relative % 86 %   Neutro Abs 9.0 (H) 1.7 - 7.7 K/uL   Lymphocytes Relative 7 %   Lymphs Abs 0.7 0.7 - 4.0 K/uL   Monocytes Relative 7 %   Monocytes Absolute 0.8 0.1 - 1.0 K/uL   Eosinophils Relative 0 %   Eosinophils Absolute 0.0 0.0 - 0.5 K/uL   Basophils Relative 0 %   Basophils Absolute 0.0 0.0 - 0.1 K/uL   Immature Granulocytes 0 %   Abs Immature Granulocytes 0.02 0.00 - 0.07 K/uL    Comment: Performed at [facility], [address], [place], [place] [address]  Protime-INR     Status: Abnormal   Collection Time: 07/04/18  8:30 PM  Result Value Ref Range   Prothrombin Time 18.4 (H) 11.4 - 15.2 seconds   INR 1.6 (H) 0.8 - 1.2    Comment: (NOTE) INR goal varies based on device and disease states. Performed at [facility], [address] [place], [place] [address]   Lactic acid, plasma     Status: None   Collection Time: 07/04/18  8:30 PM  Result Value Ref Range   Lactic Acid, Venous 1.8 0.5 - 1.9 mmol/L    Comment: Performed at [facility], [address] [place], [place] [address]  Blood gas, arterial (at [facility])     Status: Abnormal   Collection Time: 07/04/18  8:35 PM  Result Value Ref Range   FIO2 100.00    pH, Arterial 7.252 (L) 7.350 - 7.450   pCO2 arterial 97.7 (HH) 32.0 - 48.0 mmHg    Comment: CRITICAL RESULT CALLED TO, READ BACK BY AND VERIFIED WITH: J HARDEN,RN @2052  07/04/18 MKELLYM    pO2, Arterial 316 (H) 83.0 - 108.0 mmHg   Bicarbonate 36.1 (H) 20.0 - 28.0 mmol/L   Acid-Base Excess 14.0 (H) 0.0 - 2.0 mmol/L   O2 Saturation 99.2 %   Patient temperature 36.4    Collection site RIGHT RADIAL    Drawn by [id number]     Allens test (pass/fail) PASS PASS    Comment: Performed at [facility], [address] [place], [place] [address]  Troponin I (High Sensitivity)     Status: Abnormal   Collection Time:  07/04/18  8:36 PM  Result Value Ref Range   Troponin I (High Sensitivity) 1,031.00 (HH) <18 ng/L    Comment: CRITICAL RESULT CALLED TO, READ BACK BY AND VERIFIED WITH: W[place],K ON 07/04/18 AT 2145 BY LOY,C (NOTE) Elevated high sensitivity troponin I (hsTnI) values and significant  changes across serial measurements may suggest ACS but many other  chronic and acute conditions are known to elevate hsTnI results.  Refer to the Links section for chest pain algorithms and additional  guidance. Performed at [facility], [address] [place], [place] [address]   POC occult blood, ED RN will collect     Status: None   Collection Time: 07/04/18  8:54 PM  Result Value Ref Range   Fecal Occult Bld NEGATIVE NEGATIVE    Chemistries  Recent Labs  Lab 07/04/18 2030  NA 145  K 4.0  CL 97*  CO2 38*  GLUCOSE 99  BUN 20  CREATININE 0.75  CALCIUM 8.9  AST 26  ALT 12  ALKPHOS 88  BILITOT 0.6   ------------------------------------------------------------------------------------------------------------------  ------------------------------------------------------------------------------------------------------------------ GFR: CrCl cannot be calculated (Unknown ideal weight.). Liver Function Tests: Recent Labs  Lab 07/04/18 2030  AST 26  ALT 12  ALKPHOS 88  BILITOT 0.6  PROT 6.7  ALBUMIN 3.4*   Recent Labs  Lab 07/04/18 2030  LIPASE 28   No results for input(s): AMMONIA in the last 168 hours. Coagulation Profile: Recent Labs  Lab 07/04/18 2030  INR 1.6*   Cardiac Enzymes: No results for input(s): CKTOTAL, CKMB, CKMBINDEX, TROPONINI in the last 168 hours. BNP (last 3 results) No results for input(s): PROBNP in the last 8760 hours. HbA1C: No results for input(s): HGBA1C in the last  72 hours. CBG: No results for input(s): GLUCAP in the last 168 hours. Lipid Profile: No results for input(s): CHOL, HDL, LDLCALC, TRIG, CHOLHDL, LDLDIRECT in the last 72 hours. Thyroid Function Tests: No results for input(s): TSH, T4TOTAL, FREET4, T3FREE, THYROIDAB in the last 72 hours. Anemia Panel: No results for input(s): VITAMINB12, FOLATE, FERRITIN, TIBC, IRON, RETICCTPCT in the last 72 hours.  --------------------------------------------------------------------------------------------------------------- Urine analysis:    Component Value Date/Time   COLORURINE YELLOW 05/23/2018 [place] 05/23/2018 0934   LABSPEC 1.014 05/23/2018 0934   PHURINE 7.0 05/23/2018 0934   GLUCOSEU NEGATIVE 05/23/2018 0934   HGBUR SMALL (A) 05/23/2018 0934   BILIRUBINUR NEGATIVE 05/23/2018 0934   BILIRUBINUR n 09/21/2016 1432   KETONESUR NEGATIVE 05/23/2018 0934   PROTEINUR 30 (A) 05/23/2018 0934   UROBILINOGEN 0.2 09/21/2016 1432   UROBILINOGEN 0.2 04/13/2009 2025   NITRITE NEGATIVE 05/23/2018 0934   LEUKOCYTESUR NEGATIVE 05/23/2018 0934      Imaging Results:    Dg Chest Portable 1 View  Result Date: 07/04/2018 CLINICAL DATA:  Shortness of breath EXAM: PORTABLE CHEST 1 VIEW COMPARISON:  05/23/2018, 02/15/2018, 09/19/2017 FINDINGS: Mild cardiomegaly. Stable scarring at the bilateral lung bases. Slight increase retrocardiac opacity. Aortic atherosclerosis. No pneumothorax. IMPRESSION: 1. Stable pleural scarring at the bases. Slight increased left retrocardiac opacity, possible atelectasis or mild pneumonia 2. Cardiomegaly Electronically Signed   By: Donavan Foil M.D.   On: 07/04/2018 21:40    My personal review of EKG: Rhythm NSR, nonspecific intraventricular conduction delay   Assessment & Plan:    Active Problems:   Acute on chronic respiratory failure ([place])   1. Acute on chronic hypercapnic respiratory failure with hypoxia-secondary to COPD exacerbation.  Patient is  currently on BiPAP.  She also takes fentanyl patch  as well as Vicodin for pain.  Patient was given naloxone in the ED.  Start Solu-Medrol 60 mg IV every 6 hours, DuoNeb nebulizers every 6 hours.  2. ?  Hematemesis-there is history of questionable hematemesis.  Patient is unable to provide any history at this time.  Stool guaiac is negative, hemoglobin/hematocrit is 10.2/34.9.  She had EGD and colonoscopy last month.  Which only showed diverticulosis and hemorrhoids.  Patient takes Coumadin at home.  INR is 1.6.  Will hold Coumadin but continue with Brilinta as patient has history of CAD with stents.  Will repeat CBC in am.  3. Elevated troponin-patient has significant elevation of troponin I 031.00.  ED PA discussed with cardiology fellow Dr Paticia Stack, who feels this is demand ischemia.  EKG shows no significant changes.  Will cycle troponin x2.  Consult cardiology in a.m.  4. CAD status post PCI-patient does have history of CAD status post PCI with stent placement.  We will continue with Brilinta at this time.  5. Chronic pain syndrome-we will hold fentanyl, Vicodin due to hypercapnic respiratory failure.  Consider restarting low-dose opioids in a.m. to prevent opiate withdrawal.  6. Factor V Leiden -patient has history of factor V  Deficiency, she takes Coumadin at home.  INR is subtherapeutic at 1.6.  Coumadin is currently on hold due to questionable GI bleed as above.  Consider restarting Coumadin if hemoglobin remained stable.  7. Bilateral lower extremity cellulitis-right worse than left.  Start ceftriaxone 1 g IV daily.   DVT Prophylaxis-   SCDs   AM Labs Ordered, also please review Full Orders  Family Communication: Admission, patients condition and plan of care including tests being ordered have been discussed with the patient  who indicate understanding and agree with the plan and Code Status.  Code Status: Full code  Admission status: Inpatient: Based on patients clinical presentation  and evaluation of above clinical data, I have made determination that patient meets Inpatient criteria at this time.  Time spent in minutes : 60 min   Oswald Hillock M.D on 07/05/2018 at 12:55 AM

## 2018-07-05 NOTE — Progress Notes (Signed)
CRITICAL VALUE ALERT  Critical Value:  Trop 894  Date & Time Notied:  07/05/2018 0805  Provider Notified: Dyann Kief, MD  Orders Received/Actions taken: awaiting further instructions at this time

## 2018-07-05 NOTE — TOC Initial Note (Signed)
Transition of Care [facility]) - Initial/Assessment Note    Patient Details  Name: [name] MRN: [id number] Date of Birth: [birth year]  Transition of Care [facility]) CM/SW Contact:    Pierrette Scheu, Chauncey Reading, RN Phone Number: 07/05/2018, 11:56 AM  Clinical Narrative:                 Patient admitted with acute on chronic respiratory failure, currently on Bipap with AMS. From home. Recent admission, was recommended for SNF, however patient elected to go home. At that time, APS was involved, report made by EMS. Patient is a high readmission risk,  TOC to follow for needs.   Expected Discharge Plan: [place] [facility] Barriers to Discharge: Other (comment)(patient may need SNF but historically declines)     Expected Discharge Plan and Services Expected Discharge Plan: [place]   Discharge Planning Services: CM Consult       Prior Living Arrangements/Services   Lives with:: Self            Activities of Daily Living Home Assistive Devices/Equipment: Oxygen ADL Screening (condition at time of admission) Patient's cognitive ability adequate to safely complete daily activities?: Yes Is the patient deaf or have difficulty hearing?: No Does the patient have difficulty seeing, even when wearing glasses/contacts?: No Does the patient have difficulty concentrating, remembering, or making decisions?: No Patient able to express need for assistance with ADLs?: Yes Does the patient have difficulty dressing or bathing?: No Independently performs ADLs?: Yes (appropriate for developmental age) Does the patient have difficulty walking or climbing stairs?: Yes Weakness of Legs: Both Weakness of Arms/Hands: Both             Admission diagnosis:  Acute on chronic respiratory failure with hypoxia (HCC) [J96.21] Hematemesis, presence of nausea not specified [K92.0] Patient Active Problem List   Diagnosis Date Noted  . Hematemesis   . Elevated troponin   .  Malnutrition of moderate degree 05/24/2018  . Acute on chronic respiratory failure ([place]) 05/23/2018  . Melena 05/23/2018  . 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 Depression 05/23/2018  . Chronic pain syndrome 05/23/2018  . Hypercapnia 05/23/2018  . Acute on chronic respiratory failure with hypoxia and hypercapnia ([place]) 01/29/2018  . Acute and chronic respiratory failure (acute-on-chronic) ([place]) 01/29/2018  . Pressure injury of skin 01/29/2018  . Hyperglycemia 01/21/2018  . COPD with exacerbation ([place]) 01/21/2018  . Vertebral fracture, osteoporotic ([place]) 01/20/2018  . Rectal bleeding 09/05/2017  . Left lower quadrant pain 09/05/2017  . Oxygen dependent 09/05/2017  . Chronic back pain 05/16/2017  . COPD exacerbation ([place]) 02/19/2017  . Tachycardia 02/19/2017  . Influenza A 02/19/2017  . Acute respiratory failure ([place]) 02/19/2017  . Dyspnea 11/26/2014  . Loss of weight 11/26/2014  . Chronic respiratory failure ([place]) 08/27/2013  . Chest pain 08/24/2013  . Encounter for therapeutic drug monitoring 02/13/2013  . Abnormal weight loss 03/21/2012  . Cerebrovascular disease 11/15/2011  . CAD S/P percutaneous coronary angioplasty   . Factor V Leiden, prothrombin gene mutation ([place])   . Hyperlipidemia   . Chronic anticoagulation   . Hypothyroidism 11/23/2006  . TOBACCO ABUSE 11/23/2006  . History of pulmonary embolus (PE) 11/23/2006  . 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

## 2018-07-05 NOTE — Progress Notes (Signed)
Pt transported from ED to ICU on BIPAP.  Pt tolerated well.  RT will continue to monitor.

## 2018-07-05 NOTE — Progress Notes (Signed)
Patient seen and examined. Admitted after midnight secondary to acute on chronic resp failure with hypercapnia and hematemesis. Appears to be secondary to COPD exacerbation. CO2 > 90 and patient intermittently falling sleep. 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 No CP, no nausea or vomiting here. Please refer to H&P written by Dr. Darrick Meigs for further info/details on admission.  Plan: -continue duoneb, steroids and PRN albuterol -will start pulmicort and Brovana  -given still elevated PCO2 will continue BIPAP and follow ABG -follow recommendations by cardiology given elevated troponin -continue supportive care, PPI and minimize sedative agents -follow clinical response    Barton Dubois MD [phone]

## 2018-07-05 NOTE — Progress Notes (Signed)
*  PRELIMINARY RESULTS* Echocardiogram 2D Echocardiogram has been performed.  [name] 07/05/2018, 12:56 PM

## 2018-07-05 NOTE — Progress Notes (Signed)
Patient states she is not ready to go back on BIPAP yet. RN notified and to call when patient is ready. Will continue to monitor.

## 2018-07-05 NOTE — Progress Notes (Signed)
ANTICOAGULATION CONSULT NOTE - Initial Consult  Pharmacy Consult for Heparin Indication: chest pain/ACS  Allergies  Allergen Reactions  . Dilaudid [Hydromorphone Hcl] Hives and Nausea Only  . Minocycline Hcl     REACTION: Dizzy  . Prednisone     REACTION: feels like throat swelling, hallucinations  . Varenicline Tartrate     REACTION: Dizzy(chantix)   . Zocor [Simvastatin - High Dose] Other (See Comments)    myalgia    Patient Measurements: Height: 5\' 4"  (162.6 cm) Weight: 154 lb (69.9 kg) IBW/kg (Calculated) : 54.7 HEPARIN DW (KG): 68.8  Vital Signs: Temp: 98.1 F (36.7 C) (06/26 1120) Temp Source: Axillary (06/26 1120) BP: 104/47 (06/26 1300) Pulse Rate: 68 (06/26 1300)  Labs: Recent Labs    07/04/18 2030 07/04/18 2036 07/05/18 0427 07/05/18 0722  HGB 10.2*  --  8.4*  --   HCT 34.9*  --  29.9*  --   PLT 192  --  145*  --   LABPROT 18.4*  --   --   --   INR 1.6*  --   --   --   CREATININE 0.75  --  0.60  --   TROPONINIHS  --  1,031.00* 879.00* 894.00*    Estimated Creatinine Clearance: 64.6 mL/min (by C-G formula based on SCr of 0.6 mg/dL).   Medical History: Past Medical History:  Diagnosis Date  . Acute MI ([place])    x4, code blue x3  . Arteriosclerotic cardiovascular disease (ASCVD) 2002   Inf STEMI-2002. 2003-cutting balloon + brachytherapy for restenosis; subsequent acute stent thrombosis 06/2010 requiring 2 separate interventions (Zeta stent, then repeat cath with thrombectomy). focal basal inf AK, nl EF; 03/2011: Patent stents, minor nonobst  residual dz, nl EF; neg stress nuclear in 2008 and stress echo in 2009  . Chronic anticoagulation    Warfarin plus ticagrelor  . Chronic respiratory failure ([place]) 08/27/2013   On 2L 02  . COPD (chronic obstructive pulmonary disease) ([place])    02 dependent  . COPD (chronic obstructive pulmonary disease) ([place])   . Factor 5 Leiden mutation, heterozygous ([place])   . Factor V Leiden, prothrombin gene mutation ([place])  2006  . Hyperlipidemia   . Hypothyroidism   . Noncompliance   . Pelvic fracture ([place]) 2009  . Pulmonary embolism ([place]) 2006   Associated with deep vein thrombosis-2006; + factor V Leiden  . Tobacco abuse    50 pack years    Medications:  Medications Prior to Admission  Medication Sig Dispense Refill Last Dose  . albuterol (PROVENTIL) (2.5 MG/3ML) 0.083% nebulizer solution USE 1 VIAL BY NEBULIZER EVERY 4 HOURS AS NEEDED FOR WHEEZING. DX: J44.9 (Patient taking differently: Take 2.5 mg by nebulization every 4 (four) hours as needed for wheezing or shortness of breath. ) 375 vial 0   . arformoterol (BROVANA) 15 MCG/2ML NEBU Take 2 mLs (15 mcg total) by nebulization 2 (two) times daily. 120 mL 6   . baclofen (LIORESAL) 10 MG tablet Take 1 tablet (10 mg total) by mouth 3 (three) times daily. 15 tablet 0   . bisacodyl (DULCOLAX) 5 MG EC tablet Take 1 tablet (5 mg total) by mouth daily as needed for mild constipation. 30 tablet 0   . budesonide (PULMICORT) 0.5 MG/2ML nebulizer solution Take 2 mLs (0.5 mg total) by nebulization 2 (two) times daily. Dx: J43.9 120 mL 3   . buPROPion (WELLBUTRIN SR) 100 MG 12 hr tablet Take 100 mg by mouth every morning.     [name]  citalopram (CELEXA) 10 MG tablet Take 1 tablet by mouth daily.     [name] enoxaparin (LOVENOX) 80 MG/0.8ML injection Inject 1 mL (100 mg total) into the skin daily. 22.4 Syringe 0   . fentaNYL (DURAGESIC) 50 MCG/HR Place 1 patch onto the skin every 3 (three) days. 3 patch 0   . ferrous sulfate 324 (65 Fe) MG TBEC Take 1 tablet (325 mg total) by mouth daily with breakfast. 30 tablet    . folic acid (FOLVITE) 1 MG tablet Take 1 tablet (1 mg total) by mouth daily.  3   . HYDROcodone-acetaminophen (NORCO/VICODIN) 5-325 MG tablet Take 1 tablet by mouth every 6 (six) hours as needed (BREAKTHROUGH PAIN). 10 tablet 0   . ipratropium-albuterol (DUONEB) 0.5-2.5 (3) MG/3ML SOLN Take 3 mLs by nebulization 4 (four) times daily. 360 mL 3   . levothyroxine  (SYNTHROID, LEVOTHROID) 125 MCG tablet Take 1 tablet (125 mcg total) by mouth daily. 90 tablet 3   . lidocaine (LIDODERM) 5 % Place 1 patch onto the skin daily. Remove & Discard patch within 12 hours or as directed by MD (Patient not taking: Reported on 05/23/2018) 30 patch 0   . nitroGLYCERIN (NITROSTAT) 0.4 MG SL tablet Place 1 tablet (0.4 mg total) under the tongue every 5 (five) minutes as needed for chest pain. 25 tablet 3   . Omega-3 Fatty Acids (FISH OIL) 600 MG CAPS Take 1 capsule by mouth daily.      . pantoprazole (PROTONIX) 40 MG tablet Take 1 tablet (40 mg total) by mouth daily.     . polyethylene glycol (MIRALAX / GLYCOLAX) packet Take 17 g by mouth daily. (Patient not taking: Reported on 02/15/2018) 14 each 0   . Respiratory Therapy Supplies (FLUTTER) DEVI Use as directed 1 each 0   . ticagrelor (BRILINTA) 60 MG TABS tablet Take 1 tablet (60 mg total) by mouth 2 (two) times daily.     . traZODone (DESYREL) 50 MG tablet Take 50 mg by mouth at bedtime as needed for sleep.     [name] warfarin (COUMADIN) 1 MG tablet Take 1 mg by mouth See admin instructions. Alternates taking 6mg  with 5mg  daily. Takes 5mg  on Mondays, Wednesdays, and Fridays. Takes 6mg  on all other days       Assessment: Patien presented to ED with SOB. She is being treated for a COPD exacerbation. 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  Coumadin was held at the time of admission due to reports of hematemesis but no evidence of this since admission. INR subtherapeutic at 1.6 on 6/25. Hemoglobin has shown a significant drop from 10.2 to 8.4(partly dilutional). Cardiology recommended IV heparin and close monitoring of HGB given her elevated troponin and CAD. Pharmacy asked to dose.  Goal of Therapy:  Heparin level 0.3-0.7 units/ml Monitor platelets by anticoagulation protocol: Yes   Plan:  Give 4000 units bolus x 1 Start heparin infusion at 850 units/hr Check anti-Xa level in ~6-8 hours and daily  while on heparin Continue to monitor H&H and platelets  Isac Sarna, BS [place], BCPS Clinical Pharmacist Pager [phone] 07/05/2018,1:48 PM

## 2018-07-05 NOTE — ED Notes (Signed)
CRITICAL VALUE ALERT  Critical Value:  PCO2 92.5  Date & Time Notied:  07/05/2018 0459  Provider Notified: Eleonore Chiquito, MD  Orders Received/Actions taken: n/a

## 2018-07-05 NOTE — Progress Notes (Signed)
BIPAP on standby at this time. Will place patient back on for the night later.

## 2018-07-05 NOTE — Progress Notes (Signed)
[place] for Heparin Indication: chest pain/ACS  Allergies  Allergen Reactions  . Dilaudid [Hydromorphone Hcl] Hives and Nausea Only  . Minocycline Hcl     REACTION: Dizzy  . Prednisone     REACTION: feels like throat swelling, hallucinations  . Varenicline Tartrate     REACTION: Dizzy(chantix)   . Zocor [Simvastatin - High Dose] Other (See Comments)    myalgia    Patient Measurements: Height: 5\' 4"  (162.6 cm) Weight: 146 lb 9.7 oz (66.5 kg) IBW/kg (Calculated) : 54.7 HEPARIN DW (KG): 68.8  Vital Signs: Temp: 98.9 F (37.2 C) (06/26 2004) Temp Source: Oral (06/26 2004) BP: 118/52 (06/26 2000) Pulse Rate: 74 (06/26 2000)  Labs: Recent Labs    07/04/18 2030 07/04/18 2036 07/05/18 0427 07/05/18 0722 07/05/18 2215  HGB 10.2*  --  8.4*  --   --   HCT 34.9*  --  29.9*  --   --   PLT 192  --  145*  --   --   LABPROT 18.4*  --   --   --   --   INR 1.6*  --   --   --   --   HEPARINUNFRC  --   --   --   --  0.41  CREATININE 0.75  --  0.60  --   --   TROPONINIHS  --  1,031.00* 879.00* 894.00*  --     Estimated Creatinine Clearance: 63.1 mL/min (by C-G formula based on SCr of 0.6 mg/dL).   Assessment: Patien presented to ED with SOB. She is being treated for a COPD exacerbation. 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  Coumadin was held at the time of admission due to reports of hematemesis but no evidence of this since admission. INR subtherapeutic at 1.6 on 6/25. Hemoglobin has shown a significant drop from 10.2 to 8.4(partly dilutional). Cardiology recommended IV heparin and close monitoring of HGB given her elevated troponin and CAD.  Initial heparin level 0.41 units/ml  Goal of Therapy:  Heparin level 0.3-0.7 units/ml Monitor platelets by anticoagulation protocol: Yes   Plan:  Continue heparin infusion at 850 units/hr Check anti-Xa level daily while on heparin Continue to monitor H&H and  platelets  Thanks for allowing pharmacy to be a part of this patient's care.  Excell Seltzer, PharmD Clinical Pharmacist

## 2018-07-06 LAB — BASIC METABOLIC PANEL
Anion gap: 9 (ref 5–15)
BUN: 21 mg/dL (ref 8–23)
CO2: 34 mmol/L — ABNORMAL HIGH (ref 22–32)
Calcium: 8.3 mg/dL — ABNORMAL LOW (ref 8.9–10.3)
Chloride: 99 mmol/L (ref 98–111)
Creatinine, Ser: 0.66 mg/dL (ref 0.44–1.00)
GFR calc Af Amer: >60 mL/min (ref >60)
GFR calc non Af Amer: >60 mL/min (ref >60)
Glucose, Bld: 129 mg/dL — ABNORMAL HIGH (ref 70–99)
Potassium: 3.5 mmol/L (ref 3.5–5.1)
Sodium: 142 mmol/L (ref 135–145)

## 2018-07-06 LAB — CBC
HCT: 27.8 % — ABNORMAL LOW (ref 36.0–46.0)
Hemoglobin: 8.5 g/dL — ABNORMAL LOW (ref 12.0–15.0)
MCH: 29.4 pg (ref 26.0–34.0)
MCHC: 30.6 g/dL (ref 30.0–36.0)
MCV: 96.2 fL (ref 80.0–100.0)
Platelets: 155 10*3/uL (ref 150–400)
RBC: 2.89 MIL/uL — ABNORMAL LOW (ref 3.87–5.11)
RDW: 16.3 % — ABNORMAL HIGH (ref 11.5–15.5)
WBC: 5.1 10*3/uL (ref 4.0–10.5)
nRBC: 0 % (ref 0.0–0.2)

## 2018-07-06 LAB — HEPARIN LEVEL (UNFRACTIONATED): Heparin Unfractionated: 0.33 IU/mL (ref 0.30–0.70)

## 2018-07-06 NOTE — Progress Notes (Signed)
[place] for Heparin Indication: chest pain/ACS  Allergies  Allergen Reactions  . Dilaudid [Hydromorphone Hcl] Hives and Nausea Only  . Minocycline Hcl     REACTION: Dizzy  . Prednisone     REACTION: feels like throat swelling, hallucinations  . Varenicline Tartrate     REACTION: Dizzy(chantix)   . Zocor [Simvastatin - High Dose] Other (See Comments)    myalgia    Patient Measurements: Height: 5\' 4"  (162.6 cm) Weight: 138 lb 3.7 oz (62.7 kg) IBW/kg (Calculated) : 54.7 HEPARIN DW (KG): 68.8  Vital Signs: Temp: 98.2 F (36.8 C) (06/27 0429) Temp Source: Axillary (06/27 0429) BP: 126/54 (06/27 0700) Pulse Rate: 70 (06/27 0800)  Labs: Recent Labs    07/04/18 2030 07/04/18 2036 07/05/18 0427 07/05/18 0722 07/05/18 2215 07/06/18 0441 07/06/18 0756  HGB 10.2*  --  8.4*  --   --  8.5*  --   HCT 34.9*  --  29.9*  --   --  27.8*  --   PLT 192  --  145*  --   --  155  --   LABPROT 18.4*  --   --   --   --   --   --   INR 1.6*  --   --   --   --   --   --   HEPARINUNFRC  --   --   --   --  0.41  --  0.33  CREATININE 0.75  --  0.60  --   --  0.66  --   TROPONINIHS  --  1,031.00* 879.00* 894.00*  --   --   --     Estimated Creatinine Clearance: 58.1 mL/min (by C-G formula based on SCr of 0.66 mg/dL).   Assessment: Patien presented to ED with SOB. She is being treated for a COPD exacerbation. 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  Coumadin was held at the time of admission due to reports of hematemesis but no evidence of this since admission. INR subtherapeutic at 1.6 on 6/25. Hemoglobin has shown a significant drop from 10.2 to 8.4(partly dilutional). Cardiology recommended IV heparin and close monitoring of HGB given her elevated troponin and CAD.  Heparin level 0.33 units/ml remains therapeutic.  Goal of Therapy:  Heparin level 0.3-0.7 units/ml Monitor platelets by anticoagulation protocol: Yes    Plan:  Continue heparin infusion at 850 units/hr Check anti-Xa level daily while on heparin Continue to monitor H&H and platelets  Thanks for allowing pharmacy to be a part of this patient's care.  Isac Sarna, BS Pharm D, [place] Clinical Pharmacist Pager [phone] 6/27 [phone]

## 2018-07-06 NOTE — Progress Notes (Signed)
PROGRESS NOTE    [name]  VFI:[id number] DOB: [birth year] DOA: 07/04/2018 PCP: [name], NP     Brief Narrative:  As per H&P written by Dr. Darrick Meigs on 07/05/2018. 68 y.o. 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  Patient is currently on BiPAP and is unable to provide any history.  As per ED notes EMS was called by patient's friend today as she was having hematemesis.  Patient has not been alert and awake over past few days.  EMS reported that patient had periods of apnea en route to ED.  O2 sats dropped to 83% and patient was put on nonrebreather mask. ABG showed pH 7.252, PCO2 97.7, PO2 316 bicarb 36.1. Patient was put on BiPAP. SARS coronavirus 2 is negative She denies chest pain.  Denies nausea vomiting. Stool guaiac in the ED is negative.  Assessment & Plan: 1-acute on chronic respiratory failure ([place]): With hypercapnia in the setting of COPD exacerbation. -Continue steroids, flutter valve, Pulmicort, Brovana and DuoNeb -Continue intermittent use of BIPAP as needed -repeat ABG with CO2 down to 61 -follow clinical response  2-elevated troponin in the setting of known coronary artery disease -Patient denies chest pain -Concerns if her shortness of breath might be anginal equivalent -Severe elevation-sensitive troponin levels -2D echo reassuring with no wall motion abnormalities and preserved ejection fraction. -EKG showing sinus tachycardia without specific ischemic changes. -Continue Brilinta and continue heparin drip as recommended by cardiology service -Most likely [place] on 07/08/2018.  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  4-alcohol abuse -Cessation counseling has  been provided -Declines nicotine patch  5-Chronic anemia/Hematemesis -Recent endoscopic evaluation without signs of acute bleeding -No further bleeding or even vomiting appreciated on this admission -Will continue PPI -Continue close monitoring of hemoglobin trend. -Hemoglobin 8.5 today.  6-pression/anxiety -Stable mood -Continue citalopram bupropion. -No suicidal ideation or hallucination.  7-lower extremity cellulitis -Continue treatment with Rocephin.   DVT prophylaxis: Heparin drip Code Status: Full code Family Communication: No family at bedside. Disposition Plan: Given requirement of BiPAP overnight we will continue to monitor for another 24 hours in a stepdown unit.  Continue treatment for COPD exacerbation, continue heparin drip follow hemoglobin trend and further cardiology recommendations; most likely planning for Myoview on Monday.  Consultants:   Cardiology  Procedures:   2D echo: Preserved ejection fraction, no wall motion and normalities.  Positive LVH.  Antimicrobials:  Anti-infectives (From admission, onward)   Start     Dose/Rate Route Frequency Ordered Stop   07/05/18 0200  cefTRIAXone (ROCEPHIN) 1 g in sodium chloride 0.9 % 100 mL IVPB     1 g 200 mL/hr over 30 Minutes Intravenous Every 24 hours 07/05/18 0148         Subjective: Afebrile, no chest pain, no nausea or vomiting.  Breathing continued to improve.  Back to chronic oxygen supplementation through nasal cannula.  Requiring the use of BiPAP overnight.  Still having mild difficulty speaking in full sentences.  Positive wheezing on exam.  Objective: Vitals:   07/06/18 0755 07/06/18 0800 07/06/18 0811 07/06/18 0837  BP:      Pulse:  70    Resp:  (!) 27    Temp:    99.3 F (37.4 C)  TempSrc:    Oral  SpO2: 98% 99% 97%   Weight:      Height:        Intake/Output Summary (Last 24 hours) at 07/06/2018 1325 Last data filed at 07/06/2018 0431 Gross per 24 hour  Intake 412.89 ml  Output 600  ml  Net -187.11 ml   Filed Weights   07/05/18 1310 07/05/18 1400 07/06/18 0429  Weight: 69.9 kg 66.5 kg 62.7 kg    Examination: General exam: Alert, awake, oriented x 3, reports breathing improving; having mild difficulty speaking in full sentences and requiring BiPAP overnight.  Denies chest pain. Respiratory system: Positive rhonchi right, mild expiratory wheezing; no tachypnea.  No using accessory muscles.   Cardiovascular system:RRR. No murmurs, rubs, gallops. Gastrointestinal system: Abdomen is nondistended, soft and nontender. No organomegaly or masses felt. Normal bowel sounds heard. Central nervous system: Alert and oriented. No focal neurological deficits. Extremities: No C/C/E, +pedal pulses Skin: No open wounds; chronic C dermatitis changes appreciated.  Trace-1+ edema bilaterally.  Positive erythema. Psychiatry: Judgement and insight appear normal. Mood & affect appropriate.     Data Reviewed: I have personally reviewed following labs and imaging studies  CBC: Recent Labs  Lab 07/04/18 2030 07/05/18 0427 07/06/18 0441  WBC 10.5 8.3 5.1  NEUTROABS 9.0*  --   --   HGB 10.2* 8.4* 8.5*  HCT 34.9* 29.9* 27.8*  MCV 100.0 100.7* 96.2  PLT 192 145* 656   Basic Metabolic Panel: Recent Labs  Lab 07/04/18 2030 07/05/18 0427 07/06/18 0441  NA 145 145 142  K 4.0 4.0 3.5  CL 97* 102 99  CO2 38* 34* 34*  GLUCOSE 99 75 129*  BUN 20 19 21   CREATININE 0.75 0.60 0.66  CALCIUM 8.9 8.1* 8.3*   GFR: Estimated Creatinine Clearance: 58.1 mL/min (by C-G formula based on SCr of 0.66 mg/dL).   Liver Function Tests: Recent Labs  Lab 07/04/18 2030 07/05/18 0427  AST 26 23  ALT 12 11  ALKPHOS 88 71  BILITOT 0.6 0.5  PROT 6.7 5.4*  ALBUMIN 3.4* 2.8*   Recent Labs  Lab 07/04/18 2030  LIPASE 28   Coagulation Profile: Recent Labs  Lab 07/04/18 2030  INR 1.6*   Urine analysis:    Component Value Date/Time   COLORURINE YELLOW 05/23/2018 [place] 05/23/2018 0934   LABSPEC 1.014 05/23/2018 [place] 7.0 05/23/2018 0934   GLUCOSEU NEGATIVE 05/23/2018 0934   HGBUR SMALL (A) 05/23/2018 0934   BILIRUBINUR NEGATIVE 05/23/2018 0934   BILIRUBINUR n 09/21/2016 1432   KETONESUR NEGATIVE 05/23/2018 0934   PROTEINUR 30 (A) 05/23/2018 0934   UROBILINOGEN 0.2 09/21/2016 1432   UROBILINOGEN 0.2 04/13/2009 2025   NITRITE NEGATIVE 05/23/2018 0934   LEUKOCYTESUR NEGATIVE 05/23/2018 0934    Recent Results (from the past 240 hour(s))  SARS Coronavirus 2 (CEPHEID- Performed in [place] hospital lab), Hosp Order     Status: None   Collection Time: 07/04/18  8:16 PM   Specimen: Nasopharyngeal Swab  Result Value Ref Range Status   SARS Coronavirus 2 NEGATIVE NEGATIVE Final    Comment: (NOTE) If result is NEGATIVE SARS-CoV-2 target nucleic acids are NOT DETECTED. The SARS-CoV-2 RNA is generally detectable in upper and lower  respiratory specimens during the acute phase of infection. The lowest  concentration of SARS-CoV-2 viral copies this assay can detect is 250  copies / mL. A negative result does not preclude SARS-CoV-2 infection  and should not be used as the sole basis for  treatment or other  patient management decisions.  A negative result may occur with  improper specimen collection / handling, submission of specimen other  than nasopharyngeal swab, presence of viral mutation(s) within the  areas targeted by this assay, and inadequate number of viral copies  (<250 copies / mL). A negative result must be combined with clinical  observations, patient history, and epidemiological information. If result is POSITIVE SARS-CoV-2 target nucleic acids are DETECTED. The SARS-CoV-2 RNA is generally detectable in upper and lower  respiratory specimens dur ing the acute phase of infection.  Positive  results are indicative of active infection with SARS-CoV-2.  Clinical  correlation with patient history and other diagnostic information  is  necessary to determine patient infection status.  Positive results do  not rule out bacterial infection or co-infection with other viruses. If result is PRESUMPTIVE POSTIVE SARS-CoV-2 nucleic acids MAY BE PRESENT.   A presumptive positive result was obtained on the submitted specimen  and confirmed on repeat testing.  While 2019 novel coronavirus  (SARS-CoV-2) nucleic acids may be present in the submitted sample  additional confirmatory testing may be necessary for epidemiological  and / or clinical management purposes  to differentiate between  SARS-CoV-2 and other Sarbecovirus currently known to infect humans.  If clinically indicated additional testing with an alternate test  methodology [phone]) is advised. The SARS-CoV-2 RNA is generally  detectable in upper and lower respiratory sp ecimens during the acute  phase of infection. The expected result is Negative. Fact Sheet for Patients:  StrictlyIdeas.no Fact Sheet for Healthcare Providers: BankingDealers.co.za This test is not yet approved or cleared by the [place] FDA and has been authorized for detection and/or diagnosis of SARS-CoV-2 by FDA under an Emergency Use Authorization (EUA).  This EUA will remain in effect (meaning this test can be used) for the duration of the COVID-19 declaration under Section 564(b)(1) of the Act, 21 U.S.C. section 360bbb-3(b)(1), unless the authorization is terminated or revoked sooner. Performed at [facility], [address] [place], [place] [address]   Culture, blood (routine x 2)     Status: None (Preliminary result)   Collection Time: 07/05/18  2:21 AM   Specimen: BLOOD RIGHT HAND  Result Value Ref Range Status   Specimen Description BLOOD RIGHT HAND  Final   Special Requests   Final    BOTTLES DRAWN AEROBIC AND ANAEROBIC Blood Culture adequate volume   Culture   Final    NO GROWTH 1 DAY Performed at [facility], [address] [place], [place] [address]    Report Status PENDING  Incomplete  Culture, blood (routine x 2)     Status: None (Preliminary result)   Collection Time: 07/05/18  2:29 AM   Specimen: BLOOD LEFT HAND  Result Value Ref Range Status   Specimen Description BLOOD LEFT HAND  Final   Special Requests   Final    AEROBIC BOTTLE ONLY Blood Culture results may not be optimal due to an inadequate volume of blood received in culture bottles   Culture   Final    NO GROWTH 1 DAY Performed at [facility], [address] [place], [place] [address]    Report Status PENDING  Incomplete  MRSA PCR Screening     Status: None   Collection Time: 07/05/18  6:14 AM   Specimen: Nasal Mucosa; Nasopharyngeal  Result Value Ref Range Status   MRSA by PCR NEGATIVE NEGATIVE Final    Comment:  The GeneXpert MRSA Assay (FDA approved for NASAL specimens only), is one component of a comprehensive MRSA colonization surveillance program. It is not intended to diagnose MRSA infection nor to guide or monitor treatment for MRSA infections. Performed at [facility], [address] [place], [place] [address]      Radiology Studies: Ct Abdomen Pelvis W Contrast  Result Date: 07/05/2018 CLINICAL DATA:  Nausea and vomiting.  Hematemesis. EXAM: CT ABDOMEN AND PELVIS WITH CONTRAST TECHNIQUE: Multidetector CT imaging of the abdomen and pelvis was performed using the standard protocol following bolus administration of intravenous contrast. CONTRAST:  172mL OMNIPAQUE IOHEXOL 300 MG/ML  SOLN COMPARISON:  None similar FINDINGS: Lower chest: Extensive opacity in the left lower lobe superimposed on curvilinear subpleural scarring. Hepatobiliary: No focal liver abnormality.No evidence of biliary obstruction or stone. Pancreas: Unremarkable. Spleen: Unremarkable. Adrenals/Urinary Tract: Negative adrenals. No hydronephrosis or ureteral stone. There are 2 small left renal calculi. Unremarkable bladder. Stomach/Bowel: No obstruction.  No appendicitis. Two hemoclips present at the level of the rectum. Sigmoid diverticulosis. Vascular/Lymphatic: No acute vascular abnormality. Atherosclerosis with fusiform dilatation of the infrarenal aorta measuring up to 3 cm. No mass or adenopathy. Reproductive:Fibroid uterus. Discrete fibroids are difficult to discretely visualize, but the largest is likely 4 cm in the intramural right body. Other: No ascites or pneumoperitoneum. Musculoskeletal: No acute abnormalities. Remote L1, L3, and L5 superior endplate fractures. IMPRESSION: 1. Partially visualized left lower lobe opacity likely infectious/inflammatory given this area was relatively clear on recent chest imaging. Recommend follow-up to clearing. 2. No acute finding in the abdomen. 3. 3 cm infrarenal aortic aneurysm. Recommend followup by ultrasound in 3 years. This recommendation follows ACR consensus guidelines: White Paper of the ACR Incidental Findings Committee II on Vascular Findings. J Am Coll Radiol 2013; 02:637-858 4. Sigmoid diverticulosis and left nephrolithiasis. Electronically Signed   By: Monte Fantasia M.D.   On: 07/05/2018 06:23   Dg Chest Portable 1 View  Result Date: 07/04/2018 CLINICAL DATA:  Shortness of breath EXAM: PORTABLE CHEST 1 VIEW COMPARISON:  05/23/2018, 02/15/2018, 09/19/2017 FINDINGS: Mild cardiomegaly. Stable scarring at the bilateral lung bases. Slight increase retrocardiac opacity. Aortic atherosclerosis. No pneumothorax. IMPRESSION: 1. Stable pleural scarring at the bases. Slight increased left retrocardiac opacity, possible atelectasis or mild pneumonia 2. Cardiomegaly Electronically Signed   By: Donavan Foil M.D.   On: 07/04/2018 21:40    Scheduled Meds: . arformoterol  15 mcg Nebulization BID  . budesonide (PULMICORT) nebulizer solution  0.5 mg Nebulization BID  . buPROPion  100 mg Oral q morning - 10a  . citalopram  10 mg Oral Daily  . ferrous sulfate  325 mg Oral Q breakfast  . folic acid  1 mg Oral  Daily  . ipratropium-albuterol  3 mL Nebulization Q6H  . levothyroxine  125 mcg Oral Q0600  . methylPREDNISolone (SOLU-MEDROL) injection  60 mg Intravenous Q6H  . ticagrelor  60 mg Oral BID   Continuous Infusions: . sodium chloride 1,000 mL (07/06/18 0458)  . cefTRIAXone (ROCEPHIN)  IV 1 g (07/06/18 0153)  . heparin 850 Units/hr (07/05/18 1800)     LOS: 1 day    Time spent: 35 minutes. Greater than 50% of this time was spent in direct contact with the patient, coordinating care and discussing relevant ongoing clinical issues, including elevated troponin, acute on chronic respiratory failure with hypercapnia secondary to COPD exacerbation and bronchiectasis.  Discussion about findings on 2D echo and the need for most likely further cardiac stratification testing on Monday.  Patient required the use of BiPAP throughout the night, is very alert and overall feeling better today.  Using chronic home oxygen supplementation, still with wheezing and having mild difficulty speaking full sentences.   Barton Dubois, MD [facility] Pager [phone]  07/06/2018, 1:25 PM

## 2018-07-07 LAB — CBC
HCT: 26.1 % — ABNORMAL LOW (ref 36.0–46.0)
Hemoglobin: 8 g/dL — ABNORMAL LOW (ref 12.0–15.0)
MCH: 29.3 pg (ref 26.0–34.0)
MCHC: 30.7 g/dL (ref 30.0–36.0)
MCV: 95.6 fL (ref 80.0–100.0)
Platelets: 158 10*3/uL (ref 150–400)
RBC: 2.73 MIL/uL — ABNORMAL LOW (ref 3.87–5.11)
RDW: 16.8 % — ABNORMAL HIGH (ref 11.5–15.5)
WBC: 8.7 10*3/uL (ref 4.0–10.5)
nRBC: 0 % (ref 0.0–0.2)

## 2018-07-07 LAB — HEPARIN LEVEL (UNFRACTIONATED): Heparin Unfractionated: 0.32 IU/mL (ref 0.30–0.70)

## 2018-07-07 MED ORDER — HYDROCODONE-ACETAMINOPHEN 5-325 MG PO TABS
1.0000 | ORAL_TABLET | Freq: Four times a day (QID) | ORAL | Status: DC | PRN
  Administered 2018-07-07 – 2018-07-11 (×2): 1 via ORAL
  Filled 2018-07-07 (×2): qty 1

## 2018-07-07 MED ORDER — TRAZODONE HCL 50 MG PO TABS
50.0000 mg | ORAL_TABLET | Freq: Every evening | ORAL | Status: DC | PRN
  Administered 2018-07-07 – 2018-07-08 (×2): 50 mg via ORAL
  Filled 2018-07-07 (×2): qty 1

## 2018-07-07 NOTE — Progress Notes (Signed)
[facility] for Heparin Indication: chest pain/ACS  Allergies  Allergen Reactions  . Dilaudid [Hydromorphone Hcl] Hives and Nausea Only  . Minocycline Hcl     REACTION: Dizzy  . Prednisone     REACTION: feels like throat swelling, hallucinations  . Varenicline Tartrate     REACTION: Dizzy(chantix)   . Zocor [Simvastatin - High Dose] Other (See Comments)    myalgia    Patient Measurements: Height: 5\' 4"  (162.6 cm) Weight: 154 lb 8.7 oz (70.1 kg) IBW/kg (Calculated) : 54.7 HEPARIN DW (KG): 68.8  Vital Signs: Temp: 98.3 F (36.8 C) (06/28 0756) Temp Source: Oral (06/28 0756) BP: 115/57 (06/28 0700) Pulse Rate: 81 (06/28 0700)  Labs: Recent Labs    07/04/18 2030 07/04/18 2036 07/05/18 0427 07/05/18 0722 07/05/18 2215 07/06/18 0441 07/06/18 0756 07/07/18 0449  HGB 10.2*  --  8.4*  --   --  8.5*  --  8.0*  HCT 34.9*  --  29.9*  --   --  27.8*  --  26.1*  PLT 192  --  145*  --   --  155  --  158  LABPROT 18.4*  --   --   --   --   --   --   --   INR 1.6*  --   --   --   --   --   --   --   HEPARINUNFRC  --   --   --   --  0.41  --  0.33 0.32  CREATININE 0.75  --  0.60  --   --  0.66  --   --   TROPONINIHS  --  1,031.00* 879.00* 894.00*  --   --   --   --     Estimated Creatinine Clearance: 64.7 mL/min (by C-G formula based on SCr of 0.66 mg/dL).   Assessment: Patien presented to ED with SOB. She is being treated for a COPD exacerbation. 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  Coumadin was held at the time of admission due to reports of hematemesis but no evidence of this since admission. INR subtherapeutic at 1.6 on 6/25. Hemoglobin initially had shown a significant drop from 10.2 to 8.4(partly dilutional) >> 8.5, today is 8.0. Cardiology recommended IV heparin and close monitoring of HGB given her elevated troponin and CAD.  Heparin level 0.32 units/ml remains therapeutic. No overt bleeding noted per  RN.  Goal of Therapy:  Heparin level 0.3-0.7 units/ml Monitor platelets by anticoagulation protocol: Yes   Plan:  Continue heparin infusion at 850 units/hr Check anti-Xa level daily while on heparin Continue to monitor H&H and platelets  Thanks for allowing pharmacy to be a part of this patient's care.  Isac Sarna, BS Pharm D, [place] Clinical Pharmacist Pager [phone] 6/27 [phone]

## 2018-07-07 NOTE — Progress Notes (Signed)
PROGRESS NOTE    [name]  XLK:[id number] DOB: 1950-10-10 DOA: 07/04/2018 PCP: [name], NP     Brief Narrative:  As per H&P written by Dr. Darrick Meigs on 07/05/2018. 68 y.o. 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  Patient is currently on BiPAP and is unable to provide any history.  As per ED notes EMS was called by patient's friend today as she was having hematemesis.  Patient has not been alert and awake over past few days.  EMS reported that patient had periods of apnea en route to ED.  O2 sats dropped to 83% and patient was put on nonrebreather mask. ABG showed pH 7.252, PCO2 97.7, PO2 316 bicarb 36.1. Patient was put on BiPAP. SARS coronavirus 2 is negative She denies chest pain.  Denies nausea vomiting. Stool guaiac in the ED is negative.  Assessment & Plan: 1-acute on chronic respiratory failure ([place]): With hypercapnia in the setting of COPD exacerbation. -Continue steroids (starting tapering process), flutter valve, Pulmicort, Brovana and DuoNeb -Continue BiPAP nightly -repeat ABG with CO2 down to 61 -follow clinical response  2-elevated troponin in the setting of known coronary artery disease -Patient denies chest pain -Concerns if her shortness of breath might be anginal equivalent -Severe elevation-sensitive troponin levels -2D echo reassuring with no wall motion abnormalities and preserved ejection fraction. -EKG showing sinus tachycardia without specific ischemic changes. -Continue Brilinta and continue heparin drip as recommended by cardiology service -Most likely [place] on 07/08/2018; will follow cardiology service recommendations.  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 -Hemoglobin 8.5 today.  6-pression/anxiety -Stable mood -Continue citalopram bupropion. -No suicidal ideation or hallucination.  7-lower extremity cellulitis -Continue treatment with Rocephin.   DVT prophylaxis: Heparin drip Code Status: Full code Family Communication: No family at bedside. Disposition Plan: Patient will be transferred to telemetry bed. Continue treatment for COPD exacerbation, continue heparin drip follow hemoglobin trend and further cardiology recommendations; most likely planning for Myoview on Monday.  Consultants:   Cardiology  Procedures:   2D echo: Preserved ejection fraction, no wall motion and normalities.  Positive LVH.  Antimicrobials:  Anti-infectives (From admission, onward)   Start     Dose/Rate Route Frequency Ordered Stop   07/05/18 0200  cefTRIAXone (ROCEPHIN) 1 g in sodium chloride 0.9 % 100 mL IVPB     1 g 200 mL/hr over 30 Minutes Intravenous Every 24 hours 07/05/18 0148         Subjective: No fever, no chest pain, no nausea, no vomiting.  Breathing continued to improve.  Using chronic oxygen supplementation related pain and no signs of active bleeding appreciated.  Continue to have heparin drip and overnight was able to tolerate 4 hours of BiPAP.  Objective: Vitals:   07/07/18 1200 07/07/18 1246 07/07/18 1300 07/07/18 1304  BP: (!) 150/67  (!) 117/51   Pulse: (!) 106  94   Resp: (!) 22  (!) 25   Temp:  99.3 F (37.4 C)    TempSrc:  Oral    SpO2: 92%  93% 95%  Weight:      Height:        Intake/Output Summary (Last 24 hours) at 07/07/2018 1450  Last data filed at 07/07/2018 1100 Gross per 24 hour  Intake 254.37 ml  Output 1700 ml  Net -1445.63 ml   Filed Weights   07/05/18 1400 07/06/18 0429 07/07/18 0500  Weight: 66.5 kg 62.7 kg 70.1 kg    Examination: General exam: Alert, awake, oriented x 3; reports breathing stable, no chest pain, no palpitations, no nausea, no vomiting.  4 hours usage of BiPAP at nighttime (aspirin is unable to keep her longer due to inability to sleep with the mask on).  Able to speak in full sentences during evaluation. Respiratory system: No frank crackles, positive rhonchi right, mild expiratory wheezing. Cardiovascular system:RRR. No murmurs, rubs, gallops. Gastrointestinal system: Abdomen is nondistended, soft and nontender. No organomegaly or masses felt. Normal bowel sounds heard. Central nervous system: Alert and oriented. No focal neurological deficits. Extremities: No C/C/E, +pedal pulses Skin: No open wounds; chronic stasis dermatitis changes appreciated bilaterally.  Trace edema seen; mild improving erythematous changes. Psychiatry: Judgement and insight appear normal. Mood & affect appropriate.   Data Reviewed: I have personally reviewed following labs and imaging studies  CBC: Recent Labs  Lab 07/04/18 2030 07/05/18 0427 07/06/18 0441 07/07/18 0449  WBC 10.5 8.3 5.1 8.7  NEUTROABS 9.0*  --   --   --   HGB 10.2* 8.4* 8.5* 8.0*  HCT 34.9* 29.9* 27.8* 26.1*  MCV 100.0 100.7* 96.2 95.6  PLT 192 145* 155 532   Basic Metabolic Panel: Recent Labs  Lab 07/04/18 2030 07/05/18 0427 07/06/18 0441  NA 145 145 142  K 4.0 4.0 3.5  CL 97* 102 99  CO2 38* 34* 34*  GLUCOSE 99 75 129*  BUN 20 19 21   CREATININE 0.75 0.60 0.66  CALCIUM 8.9 8.1* 8.3*   GFR: Estimated Creatinine Clearance: 64.7 mL/min (by C-G formula based on SCr of 0.66 mg/dL).   Liver Function Tests: Recent Labs  Lab 07/04/18 2030 07/05/18 0427  AST 26 23  ALT 12 11  ALKPHOS 88 71  BILITOT 0.6 0.5  PROT 6.7  5.4*  ALBUMIN 3.4* 2.8*   Recent Labs  Lab 07/04/18 2030  LIPASE 28   Coagulation Profile: Recent Labs  Lab 07/04/18 2030  INR 1.6*   Urine analysis:    Component Value Date/Time   COLORURINE YELLOW 05/23/2018 [place] 05/23/2018 0934   LABSPEC 1.014 05/23/2018 [place] 7.0 05/23/2018 0934   GLUCOSEU NEGATIVE 05/23/2018 0934   HGBUR SMALL (A) 05/23/2018 0934   BILIRUBINUR NEGATIVE 05/23/2018 0934   BILIRUBINUR n 09/21/2016 1432   KETONESUR NEGATIVE 05/23/2018 0934   PROTEINUR 30 (A) 05/23/2018 0934   UROBILINOGEN 0.2 09/21/2016 1432   UROBILINOGEN 0.2 04/13/2009 2025   NITRITE NEGATIVE 05/23/2018 0934   LEUKOCYTESUR NEGATIVE 05/23/2018 0934    Recent Results (from the past 240 hour(s))  SARS Coronavirus 2 (CEPHEID- Performed in [place] hospital lab), Hosp Order     Status: None   Collection Time: 07/04/18  8:16 PM   Specimen: Nasopharyngeal Swab  Result Value Ref Range Status   SARS Coronavirus 2 NEGATIVE NEGATIVE Final    Comment: (NOTE) If result is NEGATIVE SARS-CoV-2 target nucleic acids are NOT DETECTED. The SARS-CoV-2 RNA is generally detectable in upper and lower  respiratory specimens during the acute phase of infection. The lowest  concentration of SARS-CoV-2 viral copies this assay can detect is 250  copies / mL. A negative result does not preclude SARS-CoV-2 infection  and should not be used as the sole basis for treatment or other  patient management decisions.  A negative result may occur with  improper specimen collection / handling, submission of specimen other  than nasopharyngeal swab, presence of viral mutation(s) within the  areas targeted by this assay, and inadequate number of viral copies  (<250 copies / mL). A negative result must be combined with clinical  observations, patient history, and epidemiological information. If result is POSITIVE SARS-CoV-2 target nucleic acids are DETECTED. The SARS-CoV-2 RNA is  generally detectable in upper and lower  respiratory specimens dur ing the acute phase of infection.  Positive  results are indicative of active infection with SARS-CoV-2.  Clinical  correlation with patient history and other diagnostic information is  necessary to determine patient infection status.  Positive results do  not rule out bacterial infection or co-infection with other viruses. If result is PRESUMPTIVE POSTIVE SARS-CoV-2 nucleic acids MAY BE PRESENT.   A presumptive positive result was obtained on the submitted specimen  and confirmed on repeat testing.  While 2019 novel coronavirus  (SARS-CoV-2) nucleic acids may be present in the submitted sample  additional confirmatory testing may be necessary for epidemiological  and / or clinical management purposes  to differentiate between  SARS-CoV-2 and other Sarbecovirus currently known to infect humans.  If clinically indicated additional testing with an alternate test  methodology [phone]) is advised. The SARS-CoV-2 RNA is generally  detectable in upper and lower respiratory sp ecimens during the acute  phase of infection. The expected result is Negative. Fact Sheet for Patients:  StrictlyIdeas.no Fact Sheet for Healthcare Providers: BankingDealers.co.za This test is not yet approved or cleared by the [place] FDA and has been authorized for detection and/or diagnosis of SARS-CoV-2 by FDA under an Emergency Use Authorization (EUA).  This EUA will remain in effect (meaning this test can be used) for the duration of the COVID-19 declaration under Section 564(b)(1) of the Act, 21 U.S.C. section 360bbb-3(b)(1), unless the authorization is terminated or revoked sooner. Performed at [facility], [address], [place], [place] [address]   Culture, blood (routine x 2)     Status: None (Preliminary result)   Collection Time: 07/05/18  2:21 AM   Specimen: BLOOD RIGHT HAND  Result  Value Ref Range Status   Specimen Description BLOOD RIGHT HAND  Final   Special Requests   Final    BOTTLES DRAWN AEROBIC AND ANAEROBIC Blood Culture adequate volume   Culture   Final    NO GROWTH 2 DAYS Performed at [facility], [address], [place], [place] [address]    Report Status PENDING  Incomplete  Culture, blood (routine x 2)     Status: None (Preliminary result)   Collection Time: 07/05/18  2:29 AM   Specimen: BLOOD LEFT HAND  Result Value Ref Range Status   Specimen Description BLOOD LEFT HAND  Final   Special Requests   Final    AEROBIC BOTTLE ONLY Blood Culture results may not be optimal due to an inadequate volume of blood received in culture bottles   Culture   Final    NO GROWTH 2 DAYS Performed at [facility], [address], [place], [place] [address]    Report Status PENDING  Incomplete  MRSA PCR Screening     Status: None   Collection Time:  07/05/18  6:14 AM   Specimen: Nasal Mucosa; Nasopharyngeal  Result Value Ref Range Status   MRSA by PCR NEGATIVE NEGATIVE Final    Comment:        The GeneXpert MRSA Assay (FDA approved for NASAL specimens only), is one component of a comprehensive MRSA colonization surveillance program. It is not intended to diagnose MRSA infection nor to guide or monitor treatment for MRSA infections. Performed at [facility], [address], [place], [place] [address]      Radiology Studies: No results found.  Scheduled Meds: . arformoterol  15 mcg Nebulization BID  . budesonide (PULMICORT) nebulizer solution  0.5 mg Nebulization BID  . buPROPion  100 mg Oral q morning - 10a  . citalopram  10 mg Oral Daily  . ferrous sulfate  325 mg Oral Q breakfast  . folic acid  1 mg Oral Daily  . ipratropium-albuterol  3 mL Nebulization Q6H  . levothyroxine  125 mcg Oral Q0600  . methylPREDNISolone (SOLU-MEDROL) injection  60 mg Intravenous Q6H  . ticagrelor  60 mg Oral BID   Continuous Infusions: . sodium chloride 1,000 mL  (07/06/18 0458)  . cefTRIAXone (ROCEPHIN)  IV 1 g (07/07/18 0154)  . heparin 850 Units/hr (07/07/18 0000)     LOS: 2 days    Time spent: 5 minutes.  Greater than 50% of the time was spent in direct contact with the patient, coordinating care and discussing relevant ongoing clinical issues, including elevated troponin, need for heparin drip, importance of BiPAP nightly and continue treatment for COPD exacerbation.  Patient denies any chest pain, no signs of active bleeding appreciated, no abdominal pain, no nausea vomiting.  Reports back pain (which is chronic as per patient's reports) and is still having some mild shortness of breath on exertion.  No difficulty speaking in full sentences during today examination.   Barton Dubois, MD [facility] Pager [phone]  07/07/2018, 2:50 PM

## 2018-07-07 NOTE — Progress Notes (Signed)
Night shift Blood pressures are taken on the leg due to patient expressing discomfort on her arm and refusal to wear the monitor on her arm. She is agreeable to monitoring on her leg every hour.

## 2018-07-08 DIAGNOSIS — L03119 Cellulitis of unspecified part of limb: Secondary | ICD-10-CM

## 2018-07-08 DIAGNOSIS — I248 Other forms of acute ischemic heart disease: Secondary | ICD-10-CM

## 2018-07-08 DIAGNOSIS — E782 Mixed hyperlipidemia: Secondary | ICD-10-CM

## 2018-07-08 DIAGNOSIS — I2510 Atherosclerotic heart disease of native coronary artery without angina pectoris: Secondary | ICD-10-CM

## 2018-07-08 DIAGNOSIS — J9621 Acute and chronic respiratory failure with hypoxia: Secondary | ICD-10-CM

## 2018-07-08 DIAGNOSIS — K219 Gastro-esophageal reflux disease without esophagitis: Secondary | ICD-10-CM

## 2018-07-08 LAB — HEPARIN LEVEL (UNFRACTIONATED): Heparin Unfractionated: 0.19 IU/mL — ABNORMAL LOW (ref 0.30–0.70)

## 2018-07-08 LAB — CBC
HCT: 27.9 % — ABNORMAL LOW (ref 36.0–46.0)
Hemoglobin: 8.3 g/dL — ABNORMAL LOW (ref 12.0–15.0)
MCH: 28.9 pg (ref 26.0–34.0)
MCHC: 29.7 g/dL — ABNORMAL LOW (ref 30.0–36.0)
MCV: 97.2 fL (ref 80.0–100.0)
Platelets: 160 10*3/uL (ref 150–400)
RBC: 2.87 MIL/uL — ABNORMAL LOW (ref 3.87–5.11)
RDW: 16.8 % — ABNORMAL HIGH (ref 11.5–15.5)
WBC: 6.6 10*3/uL (ref 4.0–10.5)
nRBC: 0 % (ref 0.0–0.2)

## 2018-07-08 MED ORDER — WARFARIN - PHARMACIST DOSING INPATIENT
Freq: Every day | Status: DC
  Administered 2018-07-08 – 2018-07-10 (×3)

## 2018-07-08 MED ORDER — ENOXAPARIN SODIUM 120 MG/0.8ML ~~LOC~~ SOLN
110.0000 mg | SUBCUTANEOUS | Status: DC
  Administered 2018-07-08 – 2018-07-11 (×4): 110 mg via SUBCUTANEOUS
  Filled 2018-07-08 (×4): qty 0.8

## 2018-07-08 MED ORDER — METHYLPREDNISOLONE SODIUM SUCC 125 MG IJ SOLR
60.0000 mg | Freq: Two times a day (BID) | INTRAMUSCULAR | Status: AC
  Administered 2018-07-08 – 2018-07-09 (×3): 60 mg via INTRAVENOUS
  Filled 2018-07-08 (×2): qty 2

## 2018-07-08 MED ORDER — SODIUM CHLORIDE 0.9 % IV SOLN: 1.0000 g | INTRAVENOUS | Status: DC

## 2018-07-08 MED ORDER — HEPARIN BOLUS VIA INFUSION
2000.0000 [IU] | Freq: Once | INTRAVENOUS | Status: AC
  Administered 2018-07-08: 2000 [IU] via INTRAVENOUS
  Filled 2018-07-08: qty 2000

## 2018-07-08 MED ORDER — IPRATROPIUM-ALBUTEROL 0.5-2.5 (3) MG/3ML IN SOLN
3.0000 mL | Freq: Three times a day (TID) | RESPIRATORY_TRACT | Status: DC
  Administered 2018-07-09 – 2018-07-11 (×8): 3 mL via RESPIRATORY_TRACT
  Filled 2018-07-08 (×8): qty 3

## 2018-07-08 MED ORDER — BISOPROLOL FUMARATE 5 MG PO TABS
2.5000 mg | ORAL_TABLET | Freq: Every day | ORAL | Status: DC
  Administered 2018-07-08 – 2018-07-11 (×4): 2.5 mg via ORAL
  Filled 2018-07-08 (×4): qty 1

## 2018-07-08 MED ORDER — WARFARIN SODIUM 5 MG PO TABS
6.0000 mg | ORAL_TABLET | Freq: Once | ORAL | Status: AC
  Administered 2018-07-08: 6 mg via ORAL
  Filled 2018-07-08: qty 1

## 2018-07-08 MED ORDER — CEPHALEXIN 500 MG PO CAPS
500.0000 mg | ORAL_CAPSULE | Freq: Three times a day (TID) | ORAL | Status: DC
  Administered 2018-07-08 – 2018-07-11 (×9): 500 mg via ORAL
  Filled 2018-07-08 (×9): qty 1

## 2018-07-08 MED ORDER — ROSUVASTATIN CALCIUM 10 MG PO TABS
5.0000 mg | ORAL_TABLET | Freq: Every day | ORAL | Status: DC
  Administered 2018-07-08 – 2018-07-10 (×3): 5 mg via ORAL
  Filled 2018-07-08 (×3): qty 1

## 2018-07-08 NOTE — TOC Initial Note (Signed)
Transition of Care [facility]) - Initial/Assessment Note    Patient Details  Name: [name] MRN: [id number] Date of Birth: 1951-01-08  [facility] of [facility]) CM/SW Contact:    Boneta Lucks, RN Phone Number: 07/08/2018, 1:11 PM  Clinical Narrative:     Patient lives at home alone.  Daughter lives out of town and does not have a car.  Patient needs Home health RN for education on Lovenox injection. [facility] has used [facility] In the past. Maylon Peppers to make the referral. She states they had to refuse the patient the last time due to bed bugs, but she would make a call to follow up.  After Vaughan Basta spoke with [facility], they offered to treat the patients home and she refused, therefore [facility] can not go into the home.  MD aware.  Suggested training  patient while in hospital.               Expected Discharge Plan: [place] Barriers to Discharge: Unsafe home situation(Bed bugs confirmed)   Patient Goals and CMS Choice Patient states their goals for this hospitalization and ongoing recovery are:: to go home. CMS Medicare.gov Compare Post Acute Care list provided to:: Patient Choice offered to / list presented to : Patient  Expected Discharge Plan and Services Expected Discharge Plan: [place]   Discharge Planning Services: CM Consult   Living arrangements for the past 2 months: Single Family Home                         Representative spoke with at DME Agency: Romualdo Bolk            Prior Living Arrangements/Services Living arrangements for the past 2 months: [place] Lives with:: Self   Do you feel safe going back to the place where you live?: No   Bed Bugs           Activities of Daily Living Home Assistive Devices/Equipment: Oxygen ADL Screening (condition at time of admission) Patient's cognitive ability adequate to safely complete daily activities?: Yes Is the patient deaf or have difficulty hearing?:  No Does the patient have difficulty seeing, even when wearing glasses/contacts?: No Does the patient have difficulty concentrating, remembering, or making decisions?: No Patient able to express need for assistance with ADLs?: Yes Does the patient have difficulty dressing or bathing?: No Independently performs ADLs?: Yes (appropriate for developmental age) Does the patient have difficulty walking or climbing stairs?: Yes Weakness of Legs: Both Weakness of Arms/Hands: Both  Permission Sought/Granted Permission sought to share information with : Case Manager                Emotional Assessment       Orientation: : Oriented to Self, Oriented to Place, Oriented to  Time      Admission diagnosis:  Acute on chronic respiratory failure with hypoxia (HCC) [J96.21] Hematemesis, presence of nausea not specified [K92.0] Patient Active Problem List   Diagnosis Date Noted  . Hematemesis   . Elevated troponin   . Malnutrition of moderate degree 05/24/2018  . Acute on chronic respiratory failure ([place]) 05/23/2018  . Melena 05/23/2018  . Factor V deficiency ([place]) 05/23/2018  . Depression 05/23/2018  . Chronic pain syndrome 05/23/2018  . Hypercapnia 05/23/2018  . Acute on chronic respiratory failure with hypoxia and hypercapnia ([place]) 01/29/2018  . Acute and chronic respiratory failure (acute-on-chronic) ([place]) 01/29/2018  . Pressure injury of  skin 01/29/2018  . Hyperglycemia 01/21/2018  . COPD with exacerbation ([place]) 01/21/2018  . Vertebral fracture, osteoporotic ([place]) 01/20/2018  . Rectal bleeding 09/05/2017  . Left lower quadrant pain 09/05/2017  . Oxygen dependent 09/05/2017  . Chronic back pain 05/16/2017  . COPD exacerbation ([place]) 02/19/2017  . Tachycardia 02/19/2017  . Influenza A 02/19/2017  . Acute respiratory failure ([place]) 02/19/2017  . Dyspnea 11/26/2014  . Loss of weight 11/26/2014  . Chronic respiratory failure ([place]) 08/27/2013  . Chest pain 08/24/2013  . Encounter  for therapeutic drug monitoring 02/13/2013  . Abnormal weight loss 03/21/2012  . Cerebrovascular disease 11/15/2011  . CAD S/P percutaneous coronary angioplasty   . Factor V Leiden, prothrombin gene mutation ([place])   . Hyperlipidemia   . Chronic anticoagulation   . Hypothyroidism 11/23/2006  . TOBACCO ABUSE 11/23/2006  . History of pulmonary embolus (PE) 11/23/2006  . COPD (chronic obstructive pulmonary disease) with emphysema ([place]) 11/23/2006   PCP:  [name], NP Pharmacy:   [facility] [place], [place] [address] [place] [address] Phone: [phone] Fax: [phone] [place] Gross, [place] [address] [place] [address] Phone: [phone] Fax: [phone]     Social Determinants of Health (SDOH) Interventions    Readmission Risk Interventions Readmission Risk Prevention Plan 05/29/2018  Transportation Screening Complete  Medication Review ([place]) Complete  HRI or [place] Complete  SW Recovery Care/Counseling Consult Complete  Palliative Care Screening Not [employer] Patient Refused  Some recent data might be hidden

## 2018-07-08 NOTE — Progress Notes (Signed)
Patient moved up from ICU around shift change. Nebs for 20:00 done at midnight because RT (me) overlooked nebs. Patient given new small mask and set on dream station 14/6 with 3 liter bled in to circuit of BiPAP she states this fits better. She tends not to be compliant with CPAP/BiPAP.

## 2018-07-08 NOTE — Care Management Important Message (Signed)
Important Message  Patient Details  Name: [name] MRN: [id number] Date of Birth: [birth year]   Medicare Important Message Given:  Yes     [name] 07/08/2018, 12:31 PM

## 2018-07-08 NOTE — Progress Notes (Signed)
Stamps for Heparin lovenox and Coumadin Indication: chest pain/ACS  Allergies  Allergen Reactions  . Dilaudid [Hydromorphone Hcl] Hives and Nausea Only  . Minocycline Hcl     REACTION: Dizzy  . Prednisone     REACTION: feels like throat swelling, hallucinations  . Varenicline Tartrate     REACTION: Dizzy(chantix)   . Zocor [Simvastatin - High Dose] Other (See Comments)    myalgia    Patient Measurements: Height: 5\' 4"  (162.6 cm) Weight: 154 lb 8.7 oz (70.1 kg) IBW/kg (Calculated) : 54.7 HEPARIN DW (KG): 68.8  Vital Signs: Temp: 97.7 F (36.5 C) (06/29 0456) Temp Source: Oral (06/29 0456) BP: 130/65 (06/29 0456) Pulse Rate: 78 (06/29 0456)  Labs: Recent Labs    07/06/18 0441 07/06/18 0756 07/07/18 0449 07/08/18 0433  HGB 8.5*  --  8.0* 8.3*  HCT 27.8*  --  26.1* 27.9*  PLT 155  --  158 160  HEPARINUNFRC  --  0.33 0.32 0.19*  CREATININE 0.66  --   --   --     Estimated Creatinine Clearance: 64.7 mL/min (by C-G formula based on SCr of 0.66 mg/dL).   Assessment: Patien presented to ED with SOB. She is being treated for a COPD exacerbation. 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  Coumadin was held at the time of admission due to reports of hematemesis but no evidence of this since admission. INR subtherapeutic at 1.6 on 6/25. Hemoglobin initially had shown a significant drop from 10.2 to 8.4(partly dilutional) >> 8.5, today is 8.0. Cardiology recommended IV heparin and close monitoring of HGB given her elevated troponin and CAD.  Pharmacy asked to restart coumadin and bridge with lovenox.  Coumadin Home dose is 6mg  on Sunday, Tues, Thursday and Saturday, and 5mg  on Monday, Wed, Friday. Goal of Therapy:  INR 2-3 Heparin level 0.3-0.7 units/ml Monitor platelets by anticoagulation protocol: Yes   Plan:  Coumadin 6mg  po x 1 today D/C heparin, in 1 hour start lovenox 1.5mg /kg(110mg ) sq q24h Daily  PT-INR Continue to monitor H&H and platelets  Thanks for allowing pharmacy to be a part of this patient's care.  Isac Sarna, BS Pharm D, [place] Clinical Pharmacist Pager [phone] 6/27 [phone]

## 2018-07-08 NOTE — Progress Notes (Signed)
Patient wants to go on CPAP around 12am. Will follow up with patient then.

## 2018-07-08 NOTE — Progress Notes (Signed)
PROGRESS NOTE    [name]  XNA:[id number] DOB: April 26, 1950 DOA: 07/04/2018 PCP: [name], NP     Brief Narrative:  As per H&P written by Dr. Darrick Meigs on 07/05/2018. 68 y.o. 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  Patient is currently on BiPAP and is unable to provide any history.  As per ED notes EMS was called by patient's friend today as she was having hematemesis.  Patient has not been alert and awake over past few days.  EMS reported that patient had periods of apnea en route to ED.  O2 sats dropped to 83% and patient was put on nonrebreather mask. ABG showed pH 7.252, PCO2 97.7, PO2 316 bicarb 36.1. Patient was put on BiPAP. SARS coronavirus 2 is negative She denies chest pain.  Denies nausea vomiting. Stool guaiac in the ED is negative.  Assessment & Plan: 1-acute on chronic respiratory failure ([place]): With hypercapnia in the setting of COPD exacerbation. -Continue steroids (continue tapering process), flutter valve, Pulmicort, Brovana and DuoNeb -Continue BiPAP nightly -last repeat ABG with CO2 down to 61 -follow clinical response  2-elevated troponin in the setting of known coronary artery disease -Patient denies chest pain -Concerns if her shortness of breath might be anginal equivalent -Severe elevation-sensitive troponin levels -2D echo reassuring with no wall motion abnormalities and preserved ejection fraction. -EKG showing sinus tachycardia without specific ischemic changes. -Continue Brilinta and medical management using bisoprolol, low dose statins. -Most likely Myoview on 07/08/2018; will follow cardiology service recommendations.  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 -Hemoglobin 8.3 today.  6-pression/anxiety -Stable mood -Continue citalopram bupropion. -No suicidal ideation or hallucination.  7-lower extremity cellulitis -Continue treatment with antibiotics, but will transition to oral keflex to complete therapy.    DVT prophylaxis: lovenox and coumadin. Code Status: Full code Family Communication: No family at bedside. Disposition Plan: After discussing with cardiology service decision has been made to treat medically and not pursued inpatient ischemia work-up.  Patient heparin will be discontinue, patient started on Lovenox bridging and resumption of Coumadin.  Continue treatment for COPD exacerbation and follow hemoglobin trend.  Patient remained chest pain-free.  The biotics will be transitioned to oral regimen.  Consultants:   Cardiology  Procedures:   2D echo: Preserved ejection fraction, no wall motion and normalities.  Positive LVH.  Antimicrobials:  Anti-infectives (From admission, onward)   Start     Dose/Rate Route Frequency Ordered Stop   07/09/18 0315  cefTRIAXone (ROCEPHIN) 1 g in sodium chloride 0.9 % 100 mL IVPB     1 g 200 mL/hr over 30 Minutes Intravenous Every 24 hours 07/08/18 0754     07/05/18 0200  cefTRIAXone (ROCEPHIN) 1 g in sodium chloride 0.9 % 100 mL IVPB  Status:  Discontinued     1 g 200 mL/hr over 30 Minutes Intravenous Every 24 hours 07/05/18 0148 07/08/18 0754      Subjective: Reports breathing is easier, no chest pain, no nausea, no vomiting.  Using home  chronic oxygen supplementation with good O2 sat.  Short of breath on exertion.   Objective: Vitals:   07/08/18 0456 07/08/18 0804 07/08/18 0814 07/08/18 0822  BP: 130/65     Pulse: 78     Resp: 19     Temp: 97.7 F (36.5 C)     TempSrc: Oral     SpO2: 98% 95% 100% 100%  Weight:      Height:        Intake/Output Summary (Last 24 hours) at 07/08/2018 1400 Last data filed at 07/08/2018 0900 Gross per 24 hour  Intake 240 ml  Output 600 ml  Net -360 ml   Filed Weights   07/05/18 1400 07/06/18 0429 07/07/18 0500  Weight: 66.5 kg 62.7 kg 70.1 kg    Examination: General exam: Alert, awake, oriented x 3; afebrile, no acute complaints, reports breathing is easier.  No chest pain. Respiratory system: No crackles, no using accessory muscles.  Positive rhonchi and very little expiratory wheezing on exam.  Cardiovascular system: RRR. No murmurs, rubs, gallops. Gastrointestinal system: Abdomen is nondistended, soft and nontender. No organomegaly or masses felt. Normal bowel sounds heard. Central nervous system: Alert and oriented. No focal neurological deficits. Extremities: No cyanosis or clubbing.  No edema. Skin: Erythematous changes on both legs continue improving; no open wounds, no edema appreciated. Psychiatry: Judgement and insight appear normal. Mood & affect appropriate.   Data Reviewed: I have personally reviewed following labs and imaging studies  CBC: Recent Labs  Lab 07/04/18 2030 07/05/18 0427 07/06/18 0441 07/07/18 0449 07/08/18 0433  WBC 10.5 8.3 5.1 8.7 6.6  NEUTROABS 9.0*  --   --   --   --   HGB 10.2* 8.4* 8.5* 8.0* 8.3*  HCT 34.9* 29.9* 27.8* 26.1* 27.9*  MCV 100.0 100.7* 96.2 95.6 97.2  PLT 192 145* 155 158 825   Basic Metabolic Panel: Recent Labs  Lab 07/04/18 2030 07/05/18 0427 07/06/18 0441  NA 145 145 142  K 4.0 4.0 3.5  CL 97* 102 99  CO2 38* 34* 34*  GLUCOSE 99 75 129*  BUN 20 19 21   CREATININE 0.75 0.60 0.66  CALCIUM 8.9 8.1* 8.3*   GFR: Estimated Creatinine Clearance: 64.7 mL/min (by C-G formula based on SCr of  0.66 mg/dL).   Liver Function Tests: Recent Labs  Lab 07/04/18 2030 07/05/18 0427  AST 26 23  ALT 12 11  ALKPHOS 88 71  BILITOT 0.6 0.5  PROT 6.7 5.4*  ALBUMIN 3.4* 2.8*   Recent Labs  Lab 07/04/18 2030  LIPASE 28   Coagulation Profile: Recent Labs  Lab 07/04/18 2030  INR 1.6*   Urine analysis:    Component Value Date/Time   COLORURINE YELLOW 05/23/2018 0934   APPEARANCEUR CLEAR 05/23/2018 0934   LABSPEC 1.014 05/23/2018 [facility] 7.0 05/23/2018 0934   GLUCOSEU NEGATIVE 05/23/2018 0934   HGBUR SMALL (A) 05/23/2018 0934   BILIRUBINUR NEGATIVE 05/23/2018 0934   BILIRUBINUR n 09/21/2016 1432   KETONESUR NEGATIVE 05/23/2018 0934   PROTEINUR 30 (A) 05/23/2018 0934   UROBILINOGEN 0.2 09/21/2016 1432   UROBILINOGEN 0.2 04/13/2009 2025   NITRITE NEGATIVE 05/23/2018 0934   LEUKOCYTESUR NEGATIVE 05/23/2018 0934    Recent Results (from the past 240 hour(s))  SARS Coronavirus 2 (CEPHEID- Performed in [facility] hospital lab), Hosp Order     Status: None   Collection Time: 07/04/18  8:16 PM   Specimen: Nasopharyngeal Swab  Result Value Ref Range Status   SARS Coronavirus  2 NEGATIVE NEGATIVE Final    Comment: (NOTE) If result is NEGATIVE SARS-CoV-2 target nucleic acids are NOT DETECTED. The SARS-CoV-2 RNA is generally detectable in upper and lower  respiratory specimens during the acute phase of infection. The lowest  concentration of SARS-CoV-2 viral copies this assay can detect is 250  copies / mL. A negative result does not preclude SARS-CoV-2 infection  and should not be used as the sole basis for treatment or other  patient management decisions.  A negative result may occur with  improper specimen collection / handling, submission of specimen other  than nasopharyngeal swab, presence of viral mutation(s) within the  areas targeted by this assay, and inadequate number of viral copies  (<250 copies / mL). A negative result must be combined with clinical   observations, patient history, and epidemiological information. If result is POSITIVE SARS-CoV-2 target nucleic acids are DETECTED. The SARS-CoV-2 RNA is generally detectable in upper and lower  respiratory specimens dur ing the acute phase of infection.  Positive  results are indicative of active infection with SARS-CoV-2.  Clinical  correlation with patient history and other diagnostic information is  necessary to determine patient infection status.  Positive results do  not rule out bacterial infection or co-infection with other viruses. If result is PRESUMPTIVE POSTIVE SARS-CoV-2 nucleic acids MAY BE PRESENT.   A presumptive positive result was obtained on the submitted specimen  and confirmed on repeat testing.  While 2019 novel coronavirus  (SARS-CoV-2) nucleic acids may be present in the submitted sample  additional confirmatory testing may be necessary for epidemiological  and / or clinical management purposes  to differentiate between  SARS-CoV-2 and other Sarbecovirus currently known to infect humans.  If clinically indicated additional testing with an alternate test  methodology [phone]) is advised. The SARS-CoV-2 RNA is generally  detectable in upper and lower respiratory sp ecimens during the acute  phase of infection. The expected result is Negative. Fact Sheet for Patients:  StrictlyIdeas.no Fact Sheet for Healthcare Providers: BankingDealers.co.za This test is not yet approved or cleared by the [place] FDA and has been authorized for detection and/or diagnosis of SARS-CoV-2 by FDA under an Emergency Use Authorization (EUA).  This EUA will remain in effect (meaning this test can be used) for the duration of the COVID-19 declaration under Section 564(b)(1) of the Act, 21 U.S.C. section 360bbb-3(b)(1), unless the authorization is terminated or revoked sooner. Performed at [facility], [address],  [place], [place] [address]   Culture, blood (routine x 2)     Status: None (Preliminary result)   Collection Time: 07/05/18  2:21 AM   Specimen: BLOOD RIGHT HAND  Result Value Ref Range Status   Specimen Description BLOOD RIGHT HAND  Final   Special Requests   Final    BOTTLES DRAWN AEROBIC AND ANAEROBIC Blood Culture adequate volume   Culture   Final    NO GROWTH 3 DAYS Performed at [facility], [address], [place], [place] [address]    Report Status PENDING  Incomplete  Culture, blood (routine x 2)     Status: None (Preliminary result)   Collection Time: 07/05/18  2:29 AM   Specimen: BLOOD LEFT HAND  Result Value Ref Range Status   Specimen Description BLOOD LEFT HAND  Final   Special Requests   Final    AEROBIC BOTTLE ONLY Blood Culture results may not be optimal due to an inadequate volume of blood received in culture bottles   Culture  Final    NO GROWTH 3 DAYS Performed at [facility], [address] [place], [place] [address]    Report Status PENDING  Incomplete  MRSA PCR Screening     Status: None   Collection Time: 07/05/18  6:14 AM   Specimen: Nasal Mucosa; Nasopharyngeal  Result Value Ref Range Status   MRSA by PCR NEGATIVE NEGATIVE Final    Comment:        The GeneXpert MRSA Assay (FDA approved for NASAL specimens only), is one component of a comprehensive MRSA colonization surveillance program. It is not intended to diagnose MRSA infection nor to guide or monitor treatment for MRSA infections. Performed at [facility], [address] [place], [place] [address]      Radiology Studies: No results found.  Scheduled Meds: . arformoterol  15 mcg Nebulization BID  . bisoprolol  2.5 mg Oral Daily  . budesonide (PULMICORT) nebulizer solution  0.5 mg Nebulization BID  . buPROPion  100 mg Oral q morning - 10a  . citalopram  10 mg Oral Daily  . enoxaparin (LOVENOX) injection  110 mg Subcutaneous Q24H  . ferrous sulfate  325 mg Oral Q breakfast  . folic acid   1 mg Oral Daily  . ipratropium-albuterol  3 mL Nebulization Q6H  . levothyroxine  125 mcg Oral Q0600  . methylPREDNISolone (SOLU-MEDROL) injection  60 mg Intravenous Q12H  . rosuvastatin  5 mg Oral q1800  . ticagrelor  60 mg Oral BID  . warfarin  6 mg Oral Once  . Warfarin - Pharmacist Dosing Inpatient   Does not apply q1800   Continuous Infusions: . sodium chloride 1,000 mL (07/08/18 0828)  . [START ON 07/09/2018] cefTRIAXone (ROCEPHIN)  IV       LOS: 3 days    Time spent: 30 minutes.   Barton Dubois, MD [facility] Pager [phone]  07/08/2018, 2:00 PM

## 2018-07-08 NOTE — Progress Notes (Signed)
[facility] for Heparin and Coumadin Indication: chest pain/ACS  Allergies  Allergen Reactions  . Dilaudid [Hydromorphone Hcl] Hives and Nausea Only  . Minocycline Hcl     REACTION: Dizzy  . Prednisone     REACTION: feels like throat swelling, hallucinations  . Varenicline Tartrate     REACTION: Dizzy(chantix)   . Zocor [Simvastatin - High Dose] Other (See Comments)    myalgia    Patient Measurements: Height: 5\' 4"  (162.6 cm) Weight: 154 lb 8.7 oz (70.1 kg) IBW/kg (Calculated) : 54.7 HEPARIN DW (KG): 68.8  Vital Signs: Temp: 97.7 F (36.5 C) (06/29 0456) Temp Source: Oral (06/29 0456) BP: 130/65 (06/29 0456) Pulse Rate: 78 (06/29 0456)  Labs: Recent Labs    07/06/18 0441 07/06/18 0756 07/07/18 0449 07/08/18 0433  HGB 8.5*  --  8.0* 8.3*  HCT 27.8*  --  26.1* 27.9*  PLT 155  --  158 160  HEPARINUNFRC  --  0.33 0.32 0.19*  CREATININE 0.66  --   --   --     Estimated Creatinine Clearance: 64.7 mL/min (by C-G formula based on SCr of 0.66 mg/dL).   Assessment: Patien presented to ED with SOB. She is being treated for a COPD exacerbation. 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  Coumadin was held at the time of admission due to reports of hematemesis but no evidence of this since admission. INR subtherapeutic at 1.6 on 6/25. Hemoglobin initially had shown a significant drop from 10.2 to 8.4(partly dilutional) >> 8.5, today is 8.0. Cardiology recommended IV heparin and close monitoring of HGB given her elevated troponin and CAD.  Heparin level 0.19 units/ml is subtherapeutic. No overt bleeding noted per RN, hgb stable. Spoke with RN and no problems with infusion. Pharmacy asked to restart  Coumadin Home dose is 6mg  on Sunday, Tues, Thursday and Saturday, and 5mg  on Monday, Wed, Friday. Goal of Therapy:  INR 2-3 Heparin level 0.3-0.7 units/ml Monitor platelets by anticoagulation protocol: Yes   Plan:   Coumadin 6mg  po x 1 today Heparin bolus 2000 units, then increase heparin infusion to 1050 units/hr Check anti-Xa level in ~6-8 hours and  daily while on heparin Daily PT-INR Continue to monitor H&H and platelets  Thanks for allowing pharmacy to be a part of this patient's care.  Isac Sarna, BS Pharm D, [place] Clinical Pharmacist Pager [phone] 6/27 [phone]

## 2018-07-08 NOTE — Progress Notes (Signed)
Progress Note  Patient Name: [name] Date of Encounter: 07/08/2018  Primary Cardiologist: Kate Sable, MD  Subjective   No chest pain.  States breathing more easily.  No active cough.  No palpitations.  Inpatient Medications    Scheduled Meds: . arformoterol  15 mcg Nebulization BID  . budesonide (PULMICORT) nebulizer solution  0.5 mg Nebulization BID  . buPROPion  100 mg Oral q morning - 10a  . citalopram  10 mg Oral Daily  . ferrous sulfate  325 mg Oral Q breakfast  . folic acid  1 mg Oral Daily  . ipratropium-albuterol  3 mL Nebulization Q6H  . levothyroxine  125 mcg Oral Q0600  . methylPREDNISolone (SOLU-MEDROL) injection  60 mg Intravenous Q12H  . ticagrelor  60 mg Oral BID   Continuous Infusions: . sodium chloride 1,000 mL (07/08/18 0828)  . [START ON 07/09/2018] cefTRIAXone (ROCEPHIN)  IV    . heparin 1,050 Units/hr (07/08/18 0826)   PRN Meds: acetaminophen **OR** acetaminophen, albuterol, HYDROcodone-acetaminophen, nitroGLYCERIN, ondansetron **OR** ondansetron (ZOFRAN) IV, traZODone   Vital Signs    Vitals:   07/08/18 0456 07/08/18 0804 07/08/18 0814 07/08/18 0822  BP: 130/65     Pulse: 78     Resp: 19     Temp: 97.7 F (36.5 C)     TempSrc: Oral     SpO2: 98% 95% 100% 100%  Weight:      Height:        Intake/Output Summary (Last 24 hours) at 07/08/2018 0833 Last data filed at 07/08/2018 0500 Gross per 24 hour  Intake -  Output 1700 ml  Net -1700 ml   Filed Weights   07/05/18 1400 07/06/18 0429 07/07/18 0500  Weight: 66.5 kg 62.7 kg 70.1 kg    Telemetry    Sinus rhythm.  Personally reviewed.  ECG    Tracing from 07/05/2018 shows sinus rhythm with IVCD and decreased R wave progression.  Personally reviewed.  Physical Exam   GEN:  Chronically ill-appearing woman.  No acute distress.   Neck: No JVD. Cardiac: RRR, soft systolic murmur, no gallop.  Respiratory:  Coarse, diminished breath sounds with prolonged expiratory phase..  GI: Soft, nontender, bowel sounds present. MS: No edema; No deformity. Neuro:  Nonfocal. Psych: Alert and oriented x 3. Normal affect.  Labs    Chemistry Recent Labs  Lab 07/04/18 2030 07/05/18 0427 07/06/18 0441  NA 145 145 142  K 4.0 4.0 3.5  CL 97* 102 99  CO2 38* 34* 34*  GLUCOSE 99 75 129*  BUN 20 19 21   CREATININE 0.75 0.60 0.66  CALCIUM 8.9 8.1* 8.3*  PROT 6.7 5.4*  --   ALBUMIN 3.4* 2.8*  --   AST 26 23  --   ALT 12 11  --   ALKPHOS 88 71  --   BILITOT 0.6 0.5  --   GFRNONAA >60 >60 >60  GFRAA >60 >60 >60  ANIONGAP 10 9 9      Hematology Recent Labs  Lab 07/06/18 0441 07/07/18 0449 07/08/18 0433  WBC 5.1 8.7 6.6  RBC 2.89* 2.73* 2.87*  HGB 8.5* 8.0* 8.3*  HCT 27.8* 26.1* 27.9*  MCV 96.2 95.6 97.2  MCH 29.4 29.3 28.9  MCHC 30.6 30.7 29.7*  RDW 16.3* 16.8* 16.8*  PLT 155 158 160    Cardiac EnzymesNo results for input(s): TROPONINI in the last 168 hours. No results for input(s): TROPIPOC in the last 168 hours.   BNP Recent Labs  Lab 07/04/18 2020  BNP 117.0*     Radiology    No results found.  Cardiac Studies   Echocardiogram 07/05/2018:  1. The left ventricle has normal systolic function with an ejection fraction of 60-65%. The cavity size was normal. Moderate basal septal hypertrophy. Otherwise, mild concentric LVH of remaining myocardium. Diastolic dysfunction, grade indeterminate.  Elevated left ventricular end-diastolic pressure No evidence of left ventricular regional wall motion abnormalities.  2. The right ventricle has normal systolic function. The cavity was normal. There is no increase in right ventricular wall thickness.  3. Left atrial size was mildly dilated.  4. Trivial pericardial effusion is present.  5. The mitral valve is degenerative. Mild thickening of the mitral valve leaflet. Mild calcification of the mitral valve leaflet. There is mild mitral annular calcification present.  6. The tricuspid valve is grossly normal.   7. The aortic valve was not well visualized. Mild thickening of the aortic valve. Mild calcification of the aortic valve.  8. The aortic root is normal in size and structure.  Patient Profile     68 y.o. female with past medical history of CAD (s/p PCI to RCA with both ISR in 2003 and requiring thrombectomy in 2012, cath in 2013 showing a patent stented segment in the RCA with mild ISR and minor nonobstructive CAD along the LM, LAD, and LCx), Factor V Leiden deficiency, history of PE, HTN, HLD, and COPD.  She is seen in consultation in the setting of elevated high-sensitivity troponin.  Assessment & Plan    1.  Elevated high-sensitivity troponin in range consistent with demand ischemia, peak 1031 (think 1.03 on the previous scale).  This is in the absence of chest pain or acute ST segment changes by ECG.  She presents clinically with acute on chronic hypoxic/hypercarbic respiratory failure and COPD exacerbation.  Follow-up echocardiogram shows normal LVEF at 60 to 65% without regional wall motion abnormalities.  2.  Acute on chronic hypoxic/hypercarbic respiratory failure with COPD exacerbation.  3.  Acute on chronic anemia, recent follow-up hemoglobin 8.3 is relatively stable.  No obvious active bleeding at this time.  She is on PPI.  4.  History of hyperlipidemia with previous intolerance of Zocor (not frank allergy).  5.  Factor V Leiden deficiency with history of PE and DVT, on chronic Coumadin.  She is presently on heparin bridge.  I reviewed the consultation note by Dr. Bronson Ing and hospital course.  Although inpatient ischemic testing had been contemplated, I would recommend medical therapy for ischemic heart disease at this time in the absence of angina symptoms and with the recent high-sensitivity troponin trend most consistent with demand ischemia.  Even if she were to undergo a Myoview, it is likely that we would manage her medically in light of recent comorbidities including acute on  chronic anemia.  Continue Brilinta, suggest resuming Coumadin per pharmacy unless other invasive testing is planned by primary team.  Also start low-dose bisoprolol and low-dose Crestor.  Signed, Rozann Lesches, MD  07/08/2018, 8:33 AM

## 2018-07-08 NOTE — Progress Notes (Signed)
[facility] for Heparin Indication: chest pain/ACS  Allergies  Allergen Reactions  . Dilaudid [Hydromorphone Hcl] Hives and Nausea Only  . Minocycline Hcl     REACTION: Dizzy  . Prednisone     REACTION: feels like throat swelling, hallucinations  . Varenicline Tartrate     REACTION: Dizzy(chantix)   . Zocor [Simvastatin - High Dose] Other (See Comments)    myalgia    Patient Measurements: Height: 5\' 4"  (162.6 cm) Weight: 154 lb 8.7 oz (70.1 kg) IBW/kg (Calculated) : 54.7 HEPARIN DW (KG): 68.8  Vital Signs: Temp: 97.7 F (36.5 C) (06/29 0456) Temp Source: Oral (06/29 0456) BP: 130/65 (06/29 0456) Pulse Rate: 78 (06/29 0456)  Labs: Recent Labs    07/06/18 0441 07/06/18 0756 07/07/18 0449 07/08/18 0433  HGB 8.5*  --  8.0* 8.3*  HCT 27.8*  --  26.1* 27.9*  PLT 155  --  158 160  HEPARINUNFRC  --  0.33 0.32 0.19*  CREATININE 0.66  --   --   --     Estimated Creatinine Clearance: 64.7 mL/min (by C-G formula based on SCr of 0.66 mg/dL).   Assessment: Patien presented to ED with SOB. She is being treated for a COPD exacerbation. 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  Coumadin was held at the time of admission due to reports of hematemesis but no evidence of this since admission. INR subtherapeutic at 1.6 on 6/25. Hemoglobin initially had shown a significant drop from 10.2 to 8.4(partly dilutional) >> 8.5, today is 8.0. Cardiology recommended IV heparin and close monitoring of HGB given her elevated troponin and CAD.  Heparin level 0.19 units/ml is subtherapeutic. No overt bleeding noted per RN, hgb stable. Spoke with RN and no problems with infusion.  Goal of Therapy:  Heparin level 0.3-0.7 units/ml Monitor platelets by anticoagulation protocol: Yes   Plan:  Heparin bolus 2000 units, then increase heparin infusion to 1050 units/hr Check anti-Xa level in ~6-8 hours and  daily while on heparin Continue  to monitor H&H and platelets  Thanks for allowing pharmacy to be a part of this patient's care.  Isac Sarna, BS Pharm D, [place] Clinical Pharmacist Pager [phone] 6/27 [phone]

## 2018-07-09 DIAGNOSIS — I25119 Atherosclerotic heart disease of native coronary artery with unspecified angina pectoris: Secondary | ICD-10-CM

## 2018-07-09 LAB — PROTIME-INR
INR: 1.1 (ref 0.8–1.2)
Prothrombin Time: 14.3 seconds (ref 11.4–15.2)

## 2018-07-09 LAB — CBC
HCT: 26.6 % — ABNORMAL LOW (ref 36.0–46.0)
Hemoglobin: 8 g/dL — ABNORMAL LOW (ref 12.0–15.0)
MCH: 28.9 pg (ref 26.0–34.0)
MCHC: 30.1 g/dL (ref 30.0–36.0)
MCV: 96 fL (ref 80.0–100.0)
Platelets: 154 10*3/uL (ref 150–400)
RBC: 2.77 MIL/uL — ABNORMAL LOW (ref 3.87–5.11)
RDW: 16.6 % — ABNORMAL HIGH (ref 11.5–15.5)
WBC: 6.6 10*3/uL (ref 4.0–10.5)
nRBC: 0.3 % — ABNORMAL HIGH (ref 0.0–0.2)

## 2018-07-09 MED ORDER — PREDNISONE 20 MG PO TABS
60.0000 mg | ORAL_TABLET | Freq: Every day | ORAL | Status: DC
  Filled 2018-07-09: qty 3

## 2018-07-09 MED ORDER — WARFARIN SODIUM 5 MG PO TABS: 6.0000 mg | ORAL_TABLET | Freq: Once | ORAL | Status: DC

## 2018-07-09 MED ORDER — WARFARIN SODIUM 7.5 MG PO TABS
7.5000 mg | ORAL_TABLET | Freq: Once | ORAL | Status: AC
  Administered 2018-07-09: 7.5 mg via ORAL
  Filled 2018-07-09: qty 1

## 2018-07-09 NOTE — Plan of Care (Signed)

## 2018-07-09 NOTE — Progress Notes (Addendum)
Progress Note  Patient Name: [name] Date of Encounter: 07/09/2018  Primary Cardiologist: Kate Sable, MD   Subjective   Denies any chest pain or palpitations. Denies any orthopnea or PND overnight. Breathing back to baseline. On 3L [place] (which is her baseline level at home).   Inpatient Medications    Scheduled Meds:  arformoterol  15 mcg Nebulization BID   bisoprolol  2.5 mg Oral Daily   budesonide (PULMICORT) nebulizer solution  0.5 mg Nebulization BID   buPROPion  100 mg Oral q morning - 10a   cephALEXin  500 mg Oral Q8H   citalopram  10 mg Oral Daily   enoxaparin (LOVENOX) injection  110 mg Subcutaneous Q24H   ferrous sulfate  325 mg Oral Q breakfast   folic acid  1 mg Oral Daily   ipratropium-albuterol  3 mL Nebulization TID   levothyroxine  125 mcg Oral Q0600   methylPREDNISolone (SOLU-MEDROL) injection  60 mg Intravenous Q12H   rosuvastatin  5 mg Oral q1800   ticagrelor  60 mg Oral BID   Warfarin - Pharmacist Dosing Inpatient   Does not apply q1800   Continuous Infusions:  PRN Meds: acetaminophen **OR** acetaminophen, albuterol, HYDROcodone-acetaminophen, nitroGLYCERIN, ondansetron **OR** ondansetron (ZOFRAN) IV, traZODone   Vital Signs    Vitals:   07/09/18 0748 07/09/18 0752 07/09/18 0803 07/09/18 0811  BP: 98/70     Pulse: 70     Resp: 18     Temp: 98.4 F (36.9 C)     TempSrc: Oral     SpO2: 99% 97% 100% 100%  Weight:      Height:        Intake/Output Summary (Last 24 hours) at 07/09/2018 0838 Last data filed at 07/09/2018 0500 Gross per 24 hour  Intake 720 ml  Output 1300 ml  Net -580 ml    Last 3 Weights 07/07/2018 07/06/2018 07/05/2018  Weight (lbs) 154 lb 8.7 oz 138 lb 3.7 oz 146 lb 9.7 oz  Weight (kg) 70.1 kg 62.7 kg 66.5 kg      Telemetry    NSR, HR in 60's to 80's, with occasional PVC's.  - Personally Reviewed  ECG    No new tracings.   Physical Exam   General: Well developed, well nourished, female  appearing in no acute distress. Head: Normocephalic, atraumatic.  Neck: Supple without bruits, JVD not elevated. Lungs:  Resp regular and unlabored, mild expiratory wheeze. No rales. Heart: RRR, S1, S2, no S3, S4, or murmur; no rub. Abdomen: Soft, non-tender, non-distended with normoactive bowel sounds. No hepatomegaly. No rebound/guarding. No obvious abdominal masses. Extremities: No clubbing. Trace lower extremity edema. Distal pedal pulses are 2+ bilaterally. SCD's in place.  Neuro: Alert and oriented X 3. Moves all extremities spontaneously. Psych: Normal affect.  Labs    Chemistry Recent Labs  Lab 07/04/18 2030 07/05/18 0427 07/06/18 0441  NA 145 145 142  K 4.0 4.0 3.5  CL 97* 102 99  CO2 38* 34* 34*  GLUCOSE 99 75 129*  BUN 20 19 21   CREATININE 0.75 0.60 0.66  CALCIUM 8.9 8.1* 8.3*  PROT 6.7 5.4*  --   ALBUMIN 3.4* 2.8*  --   AST 26 23  --   ALT 12 11  --   ALKPHOS 88 71  --   BILITOT 0.6 0.5  --   GFRNONAA >60 >60 >60  GFRAA >60 >60 >60  ANIONGAP 10 9 9      Hematology Recent Labs  Lab 07/07/18 [phone]  07/08/18 0433 07/09/18 0416  WBC 8.7 6.6 6.6  RBC 2.73* 2.87* 2.77*  HGB 8.0* 8.3* 8.0*  HCT 26.1* 27.9* 26.6*  MCV 95.6 97.2 96.0  MCH 29.3 28.9 28.9  MCHC 30.7 29.7* 30.1  RDW 16.8* 16.8* 16.6*  PLT 158 160 154    Cardiac EnzymesNo results for input(s): TROPONINI in the last 168 hours. No results for input(s): TROPIPOC in the last 168 hours.   BNP Recent Labs  Lab 07/04/18 2020  BNP 117.0*     DDimer No results for input(s): DDIMER in the last 168 hours.   Radiology    No results found.  Cardiac Studies   Echocardiogram: 07/05/2018 IMPRESSIONS    1. The left ventricle has normal systolic function with an ejection fraction of 60-65%. The cavity size was normal. Moderate basal septal hypertrophy. Otherwise, mild concentric LVH of remaining myocardium. Diastolic dysfunction, grade indeterminate.  Elevated left ventricular end-diastolic  pressure No evidence of left ventricular regional wall motion abnormalities.  2. The right ventricle has normal systolic function. The cavity was normal. There is no increase in right ventricular wall thickness.  3. Left atrial size was mildly dilated.  4. Trivial pericardial effusion is present.  5. The mitral valve is degenerative. Mild thickening of the mitral valve leaflet. Mild calcification of the mitral valve leaflet. There is mild mitral annular calcification present.  6. The tricuspid valve is grossly normal.  7. The aortic valve was not well visualized. Mild thickening of the aortic valve. Mild calcification of the aortic valve.  8. The aortic root is normal in size and structure.  Patient Profile     68 y.o. female with past medical history of CAD (s/p PCI to RCA with both ISR in 2003 and requiring thrombectomy in 2012, cath in 2013 showing a patent stented segment in [facility] with mild ISR and minornonobstructive CAD along the LM,LAD, and LCx), Factor V Leiden deficiency, history of PE, HTN, HLD, and COPD.  She is seen in consultation in the setting of elevated high-sensitivity troponin.  Assessment & Plan    1. Elevated Troponin Values with Known CAD - presented with worsening dyspnea and reported episodes of hemoptysis. Troponin values peaked at 1031 with EKG showing no acute ischemic changes. Repeat echo showed a preserved EF of 60-65% with no regional WMA as outlined above. - medical management has been recommended in the setting of no recent chest pain and her comorbidities. Enzyme elevation most consistent with demand ischemia in the setting of her COPD exacerbation.  - she has been restarted on PTA Brilinta with Bisoprolol and Crestor 5mg  daily being added to her regimen. She will need repeat FLP and LFT's in 6-8 weeks given statin initiation.   2. Factor V Deficiency - on Coumadin as an outpatient. Initially started on Heparin this admission with Coumadin restarted on 6/29  with Lovenox bridge. Appreciate Pharmacy's assistance with dosing.   3. Chronic Anemia - Hgb remains stable at 8.0. No evidence of active bleeding.   4. HTN - BP has been stable at 98/51 - 120/70 within the past 24 hours. Continue Bisoprolol 2.5mg  daily. Would not further titrate at this time given soft BP.   5. Chronic Hypoxic Respiratory Failure/COPD Exacerbation - Required BiPAP on admission. Now back to 3L [place] and CPAP at night. Further management per admitting team.   For questions or updates, please contact [place] Please consult [email or web address] for contact info under Cardiology/STEMI.   Signed, Erma Heritage , PA-C 8:38 AM  07/09/2018 Pager: [phone]   Attending note:  Patient seen and examined.  Case discussed with Ms. Ahmed Prima PA-C.  Ms. Enyeart denies any angina symptoms at this point, states that her breathing has improved significantly.  She remains on oxygen supplementation at prior chronic settings.  [place], heart rate is in the 70s in sinus rhythm by telemetry which I personally reviewed, systolic running high 09B to 120s.  Lungs exhibit decreased breath sounds prolonged expiratory phase.  Cardiac exam with RRR no gallop.  Lab work reviewed with hemoglobin relatively stable at 8.0.  INR is 1.1.  She is on Lovenox and has been started back on Coumadin per pharmacy.  [facility] will sign off.   Medication Recommendations: Cardiac regimen to include Zebeta, Crestor, Brilinta, and Coumadin (currently on Lovenox bridge). Other recommendations (labs, testing, etc): No further cardiac testing at this time. Follow up as an outpatient: We will schedule follow-up with Dr. Bronson Ing.  Satira Sark, M.D., F.A.C.C.

## 2018-07-09 NOTE — TOC Progression Note (Signed)
Transition of Care [facility]) - Progression Note    Patient Details  Name: Lannie Heaps MRN: [id number] Date of Birth: Dec 12, 1950  Transition of [facility]) CM/SW Contact  Boneta Lucks, RN Phone Number: 07/09/2018, 3:59 PM  Clinical Narrative:   Colletta [place] with APS called they are working with patient trying to get patient to choice SNF. States she will discuss with patient more tomorrow and update TOC. APS aware patient had placement last month and last minute went home.  No more referrals made to Home Health due to home situation, confirmed this was appropreiated with management. MD updated.     Expected Discharge Plan: [place] Barriers to Discharge: Unsafe home situation(Bed bugs confirmed)  Expected Discharge Plan and Services Expected Discharge Plan: [place]   Discharge Planning Services: CM Consult   Living arrangements for the past 2 months: Single Family Home                         Representative spoke with at DME Agency: [place] Determinants of Health ([place]) Interventions    Readmission Risk Interventions Readmission Risk Prevention Plan 05/29/2018  Transportation Screening Complete  Medication Review Press photographer) Complete  HRI or [place] Complete  SW Recovery Care/Counseling Consult Complete  [place] Patient Refused  Some recent data might be hidden

## 2018-07-09 NOTE — Progress Notes (Addendum)
[place] for  lovenox and Coumadin Indication: chest pain/ACS  Allergies  Allergen Reactions  . Dilaudid [Hydromorphone Hcl] Hives and Nausea Only  . Minocycline Hcl     REACTION: Dizzy  . Prednisone     REACTION: feels like throat swelling, hallucinations  . Varenicline Tartrate     REACTION: Dizzy(chantix)   . Zocor [Simvastatin - High Dose] Other (See Comments)    myalgia    Patient Measurements: Height: 5\' 4"  (162.6 cm) Weight: 154 lb 8.7 oz (70.1 kg) IBW/kg (Calculated) : 54.7 HEPARIN DW (KG): 68.8  Vital Signs: Temp: 98.4 F (36.9 C) (06/30 0748) Temp Source: Oral (06/30 0748) BP: 98/70 (06/30 0748) Pulse Rate: 70 (06/30 0748)  Labs: Recent Labs    07/07/18 0449 07/08/18 0433 07/09/18 0416  HGB 8.0* 8.3* 8.0*  HCT 26.1* 27.9* 26.6*  PLT 158 160 154  LABPROT  --   --  14.3  INR  --   --  1.1  HEPARINUNFRC 0.32 0.19*  --     Estimated Creatinine Clearance: 64.7 mL/min (by C-G formula based on SCr of 0.66 mg/dL).   Assessment: Patien presented to ED with SOB. She is being treated for a COPD exacerbation. 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  Coumadin was held at the time of admission due to reports of hematemesis but no evidence of this since admission. Hemoglobin today is 8.0. INR 1.1  Pharmacy asked to restart coumadin and bridge with lovenox.   Coumadin Home dose is 6mg  on Sunday, Tues, Thursday and Saturday, and 5mg  on Monday, Wed, Friday.  Goal of Therapy:  INR 2-3 Monitor platelets by anticoagulation protocol: Yes   Plan:  Coumadin 7.5 mg po x 1 today lovenox 1.5mg /kg(110mg ) sq q24h Daily PT-INR Continue to monitor H&H and platelets  Thanks for allowing pharmacy to be a part of this patient's care.  Margot Ables, PharmD Clinical Pharmacist 07/09/2018 10:30 AM

## 2018-07-09 NOTE — Progress Notes (Signed)
PROGRESS NOTE    [name]  OIZ:[id number] DOB: 1950-02-19 DOA: 07/04/2018 PCP: [name], NP     Brief Narrative:  As per H&P written by Dr. Darrick Meigs on 07/05/2018. 68 y.o. 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  Patient is currently on BiPAP and is unable to provide any history.  As per ED notes EMS was called by patient's friend today as she was having hematemesis.  Patient has not been alert and awake over past few days.  EMS reported that patient had periods of apnea en route to ED.  O2 sats dropped to 83% and patient was put on nonrebreather mask. ABG showed pH 7.252, PCO2 97.7, PO2 316 bicarb 36.1. Patient was put on BiPAP. SARS coronavirus 2 is negative She denies chest pain.  Denies nausea vomiting. Stool guaiac in the ED is negative.  Assessment & Plan: 1-acute on chronic respiratory failure ([place]): With hypercapnia in the setting of COPD exacerbation. -Continue steroids (continue tapering process, with anticipated transition to oral regimen on 7/1), continue flutter valve, Pulmicort, Brovana and DuoNeb -Continue BiPAP nightly -last repeat ABG with CO2 down to 61 -follow clinical response  2-elevated troponin in the setting of known coronary artery disease -Patient denies chest pain -Concerns if her shortness of breath might be anginal equivalent -Severe elevation-sensitive troponin levels -2D echo reassuring with no wall motion abnormalities and preserved ejection fraction. -EKG showing sinus tachycardia without specific ischemic changes. -Continue medical management using bisoprolol, Brillinta and low dose statins (crestor). -Most likely Myoview on 07/08/2018; will follow cardiology service recommendations.  3-factor V deficiency -With  known history of PE and DVT -Continue Lovenox bridging therapy and Coumadin by pharmacy -INR is still subtherapeutic -Patient with poor support at home to follow Coumadin level and agrees for bleeding or blood clots without appropriate anticoagulation. -No candidate currently for home health services secondary to bedbug infestations at home.  4-tobacco abuse -Cessation counseling has been provided -Declined nicotine patch -Reports understanding for smoking cessation  5-Chronic anemia/Hematemesis -Recent endoscopic evaluation without signs of acute bleeding -No further active bleeding or any vomiting appreciated on this admission -Will continue PPI -Continue close monitoring of hemoglobin trend. -Hemoglobin 8.0 today.  6-pression/anxiety -Stable mood -Continue citalopram bupropion. -No suicidal ideation or hallucination.  7-lower extremity cellulitis -Continue treatment with antibiotics, transitioned to oral regimen -improvement in swelling and redness appreciated.   DVT prophylaxis: lovenox and coumadin. Code Status: Full code Family Communication: No family at bedside. Disposition Plan: After discussing with cardiology service decision has been made to treat medically and not pursued inpatient ischemic work-up.  Patient heparin discontinued, patient started on Lovenox bridging and resumption of Coumadin by pharmacy.  Continue treatment for COPD exacerbation and follow hemoglobin trend.  Patient remained chest pain-free.    Consultants:   Cardiology  Procedures:   2D echo: Preserved ejection fraction, no wall motion and normalities.  Positive LVH.  Antimicrobials:  Anti-infectives (From admission, onward)   Start     Dose/Rate Route Frequency Ordered Stop   07/09/18 0315  cefTRIAXone (ROCEPHIN) 1 g in sodium chloride 0.9 % 100 mL IVPB  Status:  Discontinued     1 g 200 mL/hr over 30 Minutes Intravenous Every 24 hours 07/08/18 0754 07/08/18 1416   07/08/18 2200   cephALEXin (KEFLEX) capsule 500 mg     500  mg Oral Every 8 hours 07/08/18 1416     07/05/18 0200  cefTRIAXone (ROCEPHIN) 1 g in sodium chloride 0.9 % 100 mL IVPB  Status:  Discontinued     1 g 200 mL/hr over 30 Minutes Intravenous Every 24 hours 07/05/18 0148 07/08/18 0754      Subjective: Reports breathing continued to improve, no chest pain, no nausea, no vomiting.  Expressed feeling weak and tired.  Good oxygen saturation on chronic nasal cannula supplementation.  Objective: Vitals:   07/09/18 0803 07/09/18 0811 07/09/18 1418 07/09/18 1448  BP:   (!) 121/49   Pulse:   68   Resp:   18   Temp:   98.9 F (37.2 C)   TempSrc:   Oral   SpO2: 100% 100% 96% 98%  Weight:      Height:        Intake/Output Summary (Last 24 hours) at 07/09/2018 1640 Last data filed at 07/09/2018 1552 Gross per 24 hour  Intake 720 ml  Output 2000 ml  Net -1280 ml   Filed Weights   07/05/18 1400 07/06/18 0429 07/07/18 0500  Weight: 66.5 kg 62.7 kg 70.1 kg    Examination: General exam: Alert, awake, oriented x 3; no fever, reports breathing easier and was almost completely able to speak in full sentences.  Patient denies chest pain.  Expressed feeling weak and tired.  Complaining of chronic back pain. Respiratory system: Positive rhonchi; mild expiratory wheezing, normal respiratory effort: No using accessory muscles.  Wearing chronic 3 L nasal cannula supplementation.  Cardiovascular system:RRR. No murmurs, rubs, gallops.  No JVD. Gastrointestinal system: Abdomen is nondistended, soft and nontender. No organomegaly or masses felt. Normal bowel sounds heard. Central nervous system: Alert and oriented. No focal neurological deficits. Extremities/skin: No cyanosis or clubbing.  No edema.  Good improvement in patient's erythematous changes in both legs secondary to cellulitis. Psychiatry: Judgement and insight appear normal. Mood & affect appropriate.   Data Reviewed: I have personally reviewed following  labs and imaging studies  CBC: Recent Labs  Lab 07/04/18 2030 07/05/18 0427 07/06/18 0441 07/07/18 0449 07/08/18 0433 07/09/18 0416  WBC 10.5 8.3 5.1 8.7 6.6 6.6  NEUTROABS 9.0*  --   --   --   --   --   HGB 10.2* 8.4* 8.5* 8.0* 8.3* 8.0*  HCT 34.9* 29.9* 27.8* 26.1* 27.9* 26.6*  MCV 100.0 100.7* 96.2 95.6 97.2 96.0  PLT 192 145* 155 158 160 956   Basic Metabolic Panel: Recent Labs  Lab 07/04/18 2030 07/05/18 0427 07/06/18 0441  NA 145 145 142  K 4.0 4.0 3.5  CL 97* 102 99  CO2 38* 34* 34*  GLUCOSE 99 75 129*  BUN 20 19 21   CREATININE 0.75 0.60 0.66  CALCIUM 8.9 8.1* 8.3*   GFR: Estimated Creatinine Clearance: 64.7 mL/min (by C-G formula based on SCr of 0.66 mg/dL).   Liver Function Tests: Recent Labs  Lab 07/04/18 2030 07/05/18 0427  AST 26 23  ALT 12 11  ALKPHOS 88 71  BILITOT 0.6 0.5  PROT 6.7 5.4*  ALBUMIN 3.4* 2.8*   Recent Labs  Lab 07/04/18 2030  LIPASE 28   Coagulation Profile: Recent Labs  Lab 07/04/18 2030 07/09/18 0416  INR 1.6* 1.1   Urine analysis:    Component Value Date/Time   COLORURINE YELLOW 05/23/2018 [place] 05/23/2018 0934   LABSPEC 1.014 05/23/2018 [place] 7.0 05/23/2018 [place] 05/23/2018 0934  HGBUR SMALL (A) 05/23/2018 0934   BILIRUBINUR NEGATIVE 05/23/2018 0934   BILIRUBINUR n 09/21/2016 1432   KETONESUR NEGATIVE 05/23/2018 0934   PROTEINUR 30 (A) 05/23/2018 0934   UROBILINOGEN 0.2 09/21/2016 1432   UROBILINOGEN 0.2 04/13/2009 2025   NITRITE NEGATIVE 05/23/2018 0934   LEUKOCYTESUR NEGATIVE 05/23/2018 0934    Recent Results (from the past 240 hour(s))  SARS Coronavirus 2 (CEPHEID- Performed in [facility] hospital lab), Hosp Order     Status: None   Collection Time: 07/04/18  8:16 PM   Specimen: Nasopharyngeal Swab  Result Value Ref Range Status   SARS Coronavirus 2 NEGATIVE NEGATIVE Final    Comment: (NOTE) If result is NEGATIVE SARS-CoV-2 target nucleic acids  are NOT DETECTED. The SARS-CoV-2 RNA is generally detectable in upper and lower  respiratory specimens during the acute phase of infection. The lowest  concentration of SARS-CoV-2 viral copies this assay can detect is 250  copies / mL. A negative result does not preclude SARS-CoV-2 infection  and should not be used as the sole basis for treatment or other  patient management decisions.  A negative result may occur with  improper specimen collection / handling, submission of specimen other  than nasopharyngeal swab, presence of viral mutation(s) within the  areas targeted by this assay, and inadequate number of viral copies  (<250 copies / mL). A negative result must be combined with clinical  observations, patient history, and epidemiological information. If result is POSITIVE SARS-CoV-2 target nucleic acids are DETECTED. The SARS-CoV-2 RNA is generally detectable in upper and lower  respiratory specimens dur ing the acute phase of infection.  Positive  results are indicative of active infection with SARS-CoV-2.  Clinical  correlation with patient history and other diagnostic information is  necessary to determine patient infection status.  Positive results do  not rule out bacterial infection or co-infection with other viruses. If result is PRESUMPTIVE POSTIVE SARS-CoV-2 nucleic acids MAY BE PRESENT.   A presumptive positive result was obtained on the submitted specimen  and confirmed on repeat testing.  While 2019 novel coronavirus  (SARS-CoV-2) nucleic acids may be present in the submitted sample  additional confirmatory testing may be necessary for epidemiological  and / or clinical management purposes  to differentiate between  SARS-CoV-2 and other Sarbecovirus currently known to infect humans.  If clinically indicated additional testing with an alternate test  methodology [phone]) is advised. The SARS-CoV-2 RNA is generally  detectable in upper and lower respiratory sp ecimens  during the acute  phase of infection. The expected result is Negative. Fact Sheet for Patients:  StrictlyIdeas.no Fact Sheet for Healthcare Providers: BankingDealers.co.za This test is not yet approved or cleared by the [place] FDA and has been authorized for detection and/or diagnosis of SARS-CoV-2 by FDA under an Emergency Use Authorization (EUA).  This EUA will remain in effect (meaning this test can be used) for the duration of the COVID-19 declaration under Section 564(b)(1) of the Act, 21 U.S.C. section 360bbb-3(b)(1), unless the authorization is terminated or revoked sooner. Performed at [facility], [address] [place], [place] [address]   Culture, blood (routine x 2)     Status: None (Preliminary result)   Collection Time: 07/05/18  2:21 AM   Specimen: BLOOD RIGHT HAND  Result Value Ref Range Status   Specimen Description BLOOD RIGHT HAND  Final   Special Requests   Final    BOTTLES DRAWN AEROBIC AND ANAEROBIC Blood Culture adequate volume   Culture  Final    NO GROWTH 4 DAYS Performed at [facility], [address], [place], [place] [address]    Report Status PENDING  Incomplete  Culture, blood (routine x 2)     Status: None (Preliminary result)   Collection Time: 07/05/18  2:29 AM   Specimen: BLOOD LEFT HAND  Result Value Ref Range Status   Specimen Description BLOOD LEFT HAND  Final   Special Requests   Final    AEROBIC BOTTLE ONLY Blood Culture results may not be optimal due to an inadequate volume of blood received in culture bottles   Culture   Final    NO GROWTH 4 DAYS Performed at [facility], [address], [place], [place] [address]    Report Status PENDING  Incomplete  MRSA PCR Screening     Status: None   Collection Time: 07/05/18  6:14 AM   Specimen: Nasal Mucosa; Nasopharyngeal  Result Value Ref Range Status   MRSA by PCR NEGATIVE NEGATIVE Final    Comment:        The GeneXpert MRSA Assay  (FDA approved for NASAL specimens only), is one component of a comprehensive MRSA colonization surveillance program. It is not intended to diagnose MRSA infection nor to guide or monitor treatment for MRSA infections. Performed at [facility], [address] [place], [place] [address]      Radiology Studies: No results found.  Scheduled Meds: . arformoterol  15 mcg Nebulization BID  . bisoprolol  2.5 mg Oral Daily  . budesonide (PULMICORT) nebulizer solution  0.5 mg Nebulization BID  . buPROPion  100 mg Oral q morning - 10a  . cephALEXin  500 mg Oral Q8H  . citalopram  10 mg Oral Daily  . enoxaparin (LOVENOX) injection  110 mg Subcutaneous Q24H  . ferrous sulfate  325 mg Oral Q breakfast  . folic acid  1 mg Oral Daily  . ipratropium-albuterol  3 mL Nebulization TID  . levothyroxine  125 mcg Oral Q0600  . methylPREDNISolone (SOLU-MEDROL) injection  60 mg Intravenous Q12H  . [START ON 07/10/2018] predniSONE  60 mg Oral Q breakfast  . rosuvastatin  5 mg Oral q1800  . ticagrelor  60 mg Oral BID  . Warfarin - Pharmacist Dosing Inpatient   Does not apply q1800   Continuous Infusions:    LOS: 4 days    Time spent: 30 minutes.   Barton Dubois, MD [facility] Pager [phone]  07/09/2018, 4:40 PM

## 2018-07-10 LAB — BASIC METABOLIC PANEL
Anion gap: 6 (ref 5–15)
BUN: 19 mg/dL (ref 8–23)
CO2: 33 mmol/L — ABNORMAL HIGH (ref 22–32)
Calcium: 8.1 mg/dL — ABNORMAL LOW (ref 8.9–10.3)
Chloride: 102 mmol/L (ref 98–111)
Creatinine, Ser: 0.61 mg/dL (ref 0.44–1.00)
GFR calc Af Amer: >60 mL/min (ref >60)
GFR calc non Af Amer: >60 mL/min (ref >60)
Glucose, Bld: 125 mg/dL — ABNORMAL HIGH (ref 70–99)
Potassium: 4.5 mmol/L (ref 3.5–5.1)
Sodium: 141 mmol/L (ref 135–145)

## 2018-07-10 LAB — PROTIME-INR
INR: 1.2 (ref 0.8–1.2)
Prothrombin Time: 15 seconds (ref 11.4–15.2)

## 2018-07-10 LAB — CULTURE, BLOOD (ROUTINE X 2)
Culture: NO GROWTH
Culture: NO GROWTH
Special Requests: ADEQUATE

## 2018-07-10 LAB — CBC
HCT: 30.1 % — ABNORMAL LOW (ref 36.0–46.0)
Hemoglobin: 8.8 g/dL — ABNORMAL LOW (ref 12.0–15.0)
MCH: 28.7 pg (ref 26.0–34.0)
MCHC: 29.2 g/dL — ABNORMAL LOW (ref 30.0–36.0)
MCV: 98 fL (ref 80.0–100.0)
Platelets: 165 10*3/uL (ref 150–400)
RBC: 3.07 MIL/uL — ABNORMAL LOW (ref 3.87–5.11)
RDW: 16.2 % — ABNORMAL HIGH (ref 11.5–15.5)
WBC: 6.5 10*3/uL (ref 4.0–10.5)
nRBC: 0 % (ref 0.0–0.2)

## 2018-07-10 MED ORDER — ENOXAPARIN (LOVENOX) PATIENT EDUCATION KIT
PACK | Freq: Once | Status: AC
  Administered 2018-07-10: 15:00:00
  Filled 2018-07-10: qty 1

## 2018-07-10 MED ORDER — WARFARIN SODIUM 7.5 MG PO TABS
7.5000 mg | ORAL_TABLET | Freq: Once | ORAL | Status: AC
  Administered 2018-07-10: 7.5 mg via ORAL
  Filled 2018-07-10: qty 1

## 2018-07-10 MED ORDER — WARFARIN SODIUM 5 MG PO TABS: 6.0000 mg | ORAL_TABLET | Freq: Once | ORAL | Status: DC

## 2018-07-10 MED ORDER — METHYLPREDNISOLONE SODIUM SUCC 40 MG IJ SOLR
40.0000 mg | Freq: Two times a day (BID) | INTRAMUSCULAR | Status: DC
  Administered 2018-07-10 – 2018-07-11 (×3): 40 mg via INTRAVENOUS
  Filled 2018-07-10 (×3): qty 1

## 2018-07-10 NOTE — Evaluation (Signed)
Physical Therapy Evaluation Patient Details Name: [name] MRN: [id number] DOB: 1950-02-26 Today's Date: 07/10/2018   History of Present Illness  [name]  is a 68 y.o. 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  Patient is currently on BiPAP and is unable to provide any history.  As per ED notes EMS was called by patient's friend today as she was having hematemesis.  Patient has not been alert and awake over past few days.  EMS reported that patient had periods of apnea en route to ED.  O2 sats dropped to 83% and patient was put on nonrebreather mask.ABG showed pH 7.252, PCO2 97.7, PO2 316 bicarb 36.1.Patient was put on BiPAP.SARS coronavirus 2 is negativeShe denies chest pain.  Denies nausea vomiting.Stool guaiac in the ED is negative.    Clinical Impression  Patient functioning near baseline for functional mobility and gait, mostly limited due to difficulty breathing with SOB, able to ambulate out into hallway before fatiguing demonstrating slow labored movement to make it back to bedside.  Patient tolerated sitting up in chair after therapy - nursing staff aware.  Patient will benefit from continued physical therapy in hospital and recommended venue below to increase strength, balance, endurance for safe ADLs and gait.     Follow Up Recommendations Home health PT;Outpatient PT    Equipment Recommendations  None recommended by PT    Recommendations for Other Services       Precautions / Restrictions Precautions Precautions: Fall Restrictions Weight Bearing Restrictions: No      Mobility  Bed Mobility Overal bed mobility: Modified Independent             General bed mobility comments: increased time  Transfers Overall transfer level:  Needs assistance Equipment used: Rolling walker (2 wheeled) Transfers: Sit to/from [employer] Transfers Sit to Stand: Supervision Stand pivot transfers: Min guard       General transfer comment: labored movement, increased time  Ambulation/Gait Ambulation/Gait assistance: Min guard Gait Distance (Feet): 30 Feet Assistive device: Rolling walker (2 wheeled) Gait Pattern/deviations: Decreased step length - right;Decreased step length - left;Decreased stride length Gait velocity: decreased   General Gait Details: slow labored unsteady cadence without loss of balance, limited mostly due to c/o fatigue with SpO2 at 92% while on 3 LPM O2  Stairs            Wheelchair Mobility    Modified Rankin (Stroke Patients Only)       Balance Overall balance assessment: Needs assistance Sitting-balance support: Feet supported;No upper extremity supported Sitting balance-Leahy Scale: Good Sitting balance - Comments: seated at bedside   Standing balance support: Bilateral upper extremity supported;During functional activity Standing balance-Leahy Scale: Fair Standing balance comment: using RW                             Pertinent Vitals/Pain      Home Living Family/patient expects to be discharged to:: Private residence Living Arrangements: Alone Available Help at Discharge: Neighbor;Available PRN/intermittently Type of Home: House Home Access: Stairs to enter Entrance Stairs-Rails: Right;Left;Can reach both Entrance Stairs-Number of Steps: 6 in front (rails to wide to reach both), 3 on the side (can reach both siderails) Home Layout: One level Home Equipment: Hand held shower head;Walker - 2 wheels;Cane - single point;Shower  seat;Bedside commode;Walker - 4 wheels      Prior Function Level of Independence: Independent with assistive device(s)         Comments: household ambulator with RW     Hand Dominance   Dominant Hand: Right     Extremity/Trunk Assessment   Upper Extremity Assessment Upper Extremity Assessment: Generalized weakness    Lower Extremity Assessment Lower Extremity Assessment: Generalized weakness    Cervical / Trunk Assessment Cervical / Trunk Assessment: Kyphotic  Communication   Communication: No difficulties  Cognition Arousal/Alertness: Awake/alert Behavior During Therapy: WFL for tasks assessed/performed Overall Cognitive Status: Within Functional Limits for tasks assessed                                        General Comments      Exercises     Assessment/Plan    PT Assessment Patient needs continued PT services  PT Problem List Decreased strength;Decreased activity tolerance;Decreased balance;Decreased mobility       PT Treatment Interventions Gait training;Stair training;Functional mobility training;Patient/family education;Therapeutic exercise;Therapeutic activities    PT Goals (Current goals can be found in the Care Plan section)  Acute Rehab PT Goals Patient Stated Goal: return home with neighbor to assist PT Goal Formulation: With patient Time For Goal Achievement: 07/17/18 Potential to Achieve Goals: Good    Frequency Min 3X/week   Barriers to discharge        Co-evaluation               AM-PAC PT "6 Clicks" Mobility  Outcome Measure Help needed turning from your back to your side while in a flat bed without using bedrails?: None Help needed moving from lying on your back to sitting on the side of a flat bed without using bedrails?: None Help needed moving to and from a bed to a chair (including a wheelchair)?: A Little Help needed standing up from a chair using your arms (e.g., wheelchair or bedside chair)?: None Help needed to walk in hospital room?: A Little Help needed climbing 3-5 steps with a railing? : A Lot 6 Click Score: 20    End of Session Equipment Utilized During Treatment: Oxygen Activity Tolerance: Patient tolerated  treatment well;Patient limited by fatigue Patient left: in chair;with call bell/phone within reach Nurse Communication: Mobility status PT Visit Diagnosis: Unsteadiness on feet (R26.81);Other abnormalities of gait and mobility (R26.89);Muscle weakness (generalized) (M62.81)    Time: 1441-1510 PT Time Calculation (min) (ACUTE ONLY): 29 min   Charges:   PT Evaluation $PT Eval Moderate Complexity: 1 Mod PT Treatments $Therapeutic Activity: 23-37 mins        3:52 PM, 07/10/18 Lonell Grandchild, MPT Physical Therapist with [facility] [phone] office [phone] mobile phone

## 2018-07-10 NOTE — Care Management Important Message (Signed)
Important Message  Patient Details  Name: [name] MRN: [id number] Date of Birth: 08-14-1950   Medicare Important Message Given:  Yes     [name] 07/10/2018, 12:17 PM

## 2018-07-10 NOTE — Plan of Care (Signed)
  Problem: Acute Rehab PT Goals(only PT should resolve) Goal: Patient Will Transfer Sit To/From Stand Outcome: Progressing Flowsheets (Taken 07/10/2018 1554) Patient will transfer sit to/from stand: with modified independence Goal: Pt Will Transfer Bed To Chair/Chair To Bed Outcome: Progressing Flowsheets (Taken 07/10/2018 1554) Pt will Transfer Bed to Chair/Chair to Bed: with modified independence Goal: Pt Will Ambulate Outcome: Progressing Flowsheets (Taken 07/10/2018 1554) Pt will Ambulate:  50 feet  with supervision  with rolling walker   3:54 PM, 07/10/18 Lonell Grandchild, MPT Physical Therapist with [facility] [phone] office [phone] mobile phone

## 2018-07-10 NOTE — Progress Notes (Signed)
PROGRESS NOTE    [name]  KGU:[id number] DOB: [birth year] DOA: 07/04/2018 PCP: [name], NP   Brief Narrative:  As per H&P written by Dr. Darrick Meigs on 07/05/2018. 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 As per ED notes EMS was called by patient's friend today as she was having hematemesis. Patient has not been alert and awake over past few days. EMS reported that patient had periods ofapnea en route to ED. O2 sats dropped to 83% and patient was put on nonrebreather mask. ABG showed pH 7.252, PCO2 97.7, PO2 316 bicarb 36.1. Patient was put on BiPAP. SARS coronavirus 2 is negative She denies chest pain. Denies nausea vomiting. Stool guaiac in the ED is negative.   Assessment & Plan:   Active Problems:   Acute on chronic respiratory failure (HCC)   Hematemesis   Elevated troponin  1-acute on chronic respiratory failure ([place]): With hypercapnia in the setting of COPD exacerbation. -Continue steroids (continue tapering process, with anticipated transition to oral regimen on 7/1), continue flutter valve, Pulmicort, Brovana and DuoNeb -Continue BiPAP nightly -last repeat ABG with CO2 down to 61 -follow clinical response  2-elevated troponin in the setting of known coronary artery disease -Patient denies chest pain -Concerns if her shortness of breath might be anginal equivalent -Severe elevation-sensitive troponin levels -2D echo reassuring with no wall motion abnormalities and preserved ejection fraction. -EKG showing sinus tachycardia without specific ischemic changes. -Continue medical management using bisoprolol, Brillinta and low dose statins (crestor). 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 -No candidate currently for home health services secondary to bedbug infestations at home.  4-tobacco abuse -Cessation counseling has been provided -Declined nicotine patch -Reports understanding for smoking cessation  5-Chronic anemia/Hematemesis-stabilizing -Recent endoscopic evaluation without signs of acute bleeding -No further active bleeding or any vomiting appreciated on this admission -Will continue PPI -Continue close monitoring of hemoglobin trend. -Hemoglobin 8.8 today.  6-pression/anxiety -Stable mood -Continue citalopram bupropion. -No suicidal ideation or hallucination.  7-lower extremity cellulitis -Continue treatment with antibiotics, transitioned to oral regimen on Keflex -improvement in swelling and redness appreciated.    DVT prophylaxis: Coumadin with Lovenox bridging Code Status: Full Family Communication: None at bedside Disposition Plan: Cardiology signed off and patient restarted on Lovenox bridging with Coumadin.  Continue treatment of COPD exacerbation and follow CBC which has remained stable.  PT evaluation ordered today with likely need for SNF if agreeable.  APS evaluation pending.   Consultants:   Cardiology  Procedures:   2D echocardiogram with preserved EF and no wall motion abnormalities.  Positive LVH  Antimicrobials:  Anti-infectives (From admission, onward)   Start     Dose/Rate Route Frequency Ordered Stop   07/09/18 0315  cefTRIAXone (ROCEPHIN) 1 g in sodium chloride 0.9 % 100 mL IVPB  Status:  Discontinued     1 g 200 mL/hr over 30 Minutes Intravenous Every 24 hours 07/08/18 0754  07/08/18 1416   07/08/18 2200  cephALEXin (KEFLEX) capsule 500 mg  500 mg Oral Every 8 hours 07/08/18 1416     07/05/18 0200  cefTRIAXone (ROCEPHIN) 1 g in sodium chloride 0.9 % 100 mL IVPB  Status:  Discontinued     1 g 200 mL/hr over 30 Minutes Intravenous Every 24 hours 07/05/18 0148 07/08/18 0754        Subjective: Patient seen and evaluated today with no new acute complaints or concerns. No acute concerns or events noted overnight.  She states that her dyspnea is improving and denies any chest pain.  She continues to feel quite weak.  Objective: Vitals:   07/10/18 0741 07/10/18 0902 07/10/18 1212 07/10/18 1336  BP:  123/63 (!) 123/59   Pulse:  73 72   Resp:  20 18   Temp:  98.4 F (36.9 C) 98.7 F (37.1 C)   TempSrc:  Oral Oral   SpO2: 97% 98% 96% 95%  Weight:      Height:        Intake/Output Summary (Last 24 hours) at 07/10/2018 1405 Last data filed at 07/10/2018 1300 Gross per 24 hour  Intake 957 ml  Output 3300 ml  Net -2343 ml   Filed Weights   07/05/18 1400 07/06/18 0429 07/07/18 0500  Weight: 66.5 kg 62.7 kg 70.1 kg    Examination:  General exam: Appears calm and comfortable  Respiratory system: Clear to auscultation. Respiratory effort normal.  On nasal cannula oxygen Cardiovascular system: S1 & S2 heard, RRR. No JVD, murmurs, rubs, gallops or clicks. No pedal edema. Gastrointestinal system: Abdomen is nondistended, soft and nontender. No organomegaly or masses felt. Normal bowel sounds heard. Central nervous system: Alert and oriented. No focal neurological deficits. Extremities: Symmetric 5 x 5 power.  Erythema noted bilaterally that is appearing to clear Skin: No rashes, lesions or ulcers Psychiatry: Judgement and insight appear normal. Mood & affect appropriate.     Data Reviewed: I have personally reviewed following labs and imaging studies  CBC: Recent Labs  Lab 07/04/18 2030  07/06/18 0441 07/07/18 0449 07/08/18 0433 07/09/18 0416  07/10/18 0434  WBC 10.5   < > 5.1 8.7 6.6 6.6 6.5  NEUTROABS 9.0*  --   --   --   --   --   --   HGB 10.2*   < > 8.5* 8.0* 8.3* 8.0* 8.8*  HCT 34.9*   < > 27.8* 26.1* 27.9* 26.6* 30.1*  MCV 100.0   < > 96.2 95.6 97.2 96.0 98.0  PLT 192   < > 155 158 160 154 165   < > = values in this interval not displayed.   Basic Metabolic Panel: Recent Labs  Lab 07/04/18 2030 07/05/18 0427 07/06/18 0441 07/10/18 0434  NA 145 145 142 141  K 4.0 4.0 3.5 4.5  CL 97* 102 99 102  CO2 38* 34* 34* 33*  GLUCOSE 99 75 129* 125*  BUN 20 19 21 19   CREATININE 0.75 0.60 0.66 0.61  CALCIUM 8.9 8.1* 8.3* 8.1*   GFR: Estimated Creatinine Clearance: 64.7 mL/min (by C-G formula based on SCr of 0.61 mg/dL). Liver Function Tests: Recent Labs  Lab 07/04/18 2030 07/05/18 0427  AST 26 23  ALT 12 11  ALKPHOS 88 71  BILITOT 0.6 0.5  PROT 6.7 5.4*  ALBUMIN 3.4* 2.8*   Recent Labs  Lab 07/04/18 2030  LIPASE 28   No results for input(s): AMMONIA in the last 168 hours. Coagulation Profile: Recent Labs  Lab 07/04/18 2030 07/09/18 0416 07/10/18 0434  INR 1.6*  1.1 1.2   Cardiac Enzymes: No results for input(s): CKTOTAL, CKMB, CKMBINDEX, TROPONINI in the last 168 hours. BNP (last 3 results) No results for input(s): PROBNP in the last 8760 hours. HbA1C: No results for input(s): HGBA1C in the last 72 hours. CBG: No results for input(s): GLUCAP in the last 168 hours. Lipid Profile: No results for input(s): CHOL, HDL, LDLCALC, TRIG, CHOLHDL, LDLDIRECT in the last 72 hours. Thyroid Function Tests: No results for input(s): TSH, T4TOTAL, FREET4, T3FREE, THYROIDAB in the last 72 hours. Anemia Panel: No results for input(s): VITAMINB12, FOLATE, FERRITIN, TIBC, IRON, RETICCTPCT in the last 72 hours. Sepsis Labs: Recent Labs  Lab 07/04/18 2030  LATICACIDVEN 1.8    Recent Results (from the past 240 hour(s))  SARS Coronavirus 2 (CEPHEID- Performed in [facility] hospital lab), Hosp Order     Status:  None   Collection Time: 07/04/18  8:16 PM   Specimen: Nasopharyngeal Swab  Result Value Ref Range Status   SARS Coronavirus 2 NEGATIVE NEGATIVE Final    Comment: (NOTE) If result is NEGATIVE SARS-CoV-2 target nucleic acids are NOT DETECTED. The SARS-CoV-2 RNA is generally detectable in upper and lower  respiratory specimens during the acute phase of infection. The lowest  concentration of SARS-CoV-2 viral copies this assay can detect is 250  copies / mL. A negative result does not preclude SARS-CoV-2 infection  and should not be used as the sole basis for treatment or other  patient management decisions.  A negative result may occur with  improper specimen collection / handling, submission of specimen other  than nasopharyngeal swab, presence of viral mutation(s) within the  areas targeted by this assay, and inadequate number of viral copies  (<250 copies / mL). A negative result must be combined with clinical  observations, patient history, and epidemiological information. If result is POSITIVE SARS-CoV-2 target nucleic acids are DETECTED. The SARS-CoV-2 RNA is generally detectable in upper and lower  respiratory specimens dur ing the acute phase of infection.  Positive  results are indicative of active infection with SARS-CoV-2.  Clinical  correlation with patient history and other diagnostic information is  necessary to determine patient infection status.  Positive results do  not rule out bacterial infection or co-infection with other viruses. If result is PRESUMPTIVE POSTIVE SARS-CoV-2 nucleic acids MAY BE PRESENT.   A presumptive positive result was obtained on the submitted specimen  and confirmed on repeat testing.  While 2019 novel coronavirus  (SARS-CoV-2) nucleic acids may be present in the submitted sample  additional confirmatory testing may be necessary for epidemiological  and / or clinical management purposes  to differentiate between  SARS-CoV-2 and other  Sarbecovirus currently known to infect humans.  If clinically indicated additional testing with an alternate test  methodology [phone]) is advised. The SARS-CoV-2 RNA is generally  detectable in upper and lower respiratory sp ecimens during the acute  phase of infection. The expected result is Negative. Fact Sheet for Patients:  StrictlyIdeas.no Fact Sheet for Healthcare Providers: BankingDealers.co.za This test is not yet approved or cleared by the [place] FDA and has been authorized for detection and/or diagnosis of SARS-CoV-2 by FDA under an Emergency Use Authorization (EUA).  This EUA will remain in effect (meaning this test can be used) for the duration of the COVID-19 declaration under Section 564(b)(1) of the Act, 21 U.S.C. section 360bbb-3(b)(1), unless the authorization is terminated or revoked sooner. Performed at [facility], [address] [place], [place] [address]   Culture, blood (  routine x 2)     Status: None   Collection Time: 07/05/18  2:21 AM   Specimen: BLOOD RIGHT HAND  Result Value Ref Range Status   Specimen Description BLOOD RIGHT HAND  Final   Special Requests   Final    BOTTLES DRAWN AEROBIC AND ANAEROBIC Blood Culture adequate volume   Culture   Final    NO GROWTH 5 DAYS Performed at [facility], [address] [place], [place] [address]    Report Status 07/10/2018 FINAL  Final  Culture, blood (routine x 2)     Status: None   Collection Time: 07/05/18  2:29 AM   Specimen: BLOOD LEFT HAND  Result Value Ref Range Status   Specimen Description BLOOD LEFT HAND  Final   Special Requests   Final    AEROBIC BOTTLE ONLY Blood Culture results may not be optimal due to an inadequate volume of blood received in culture bottles   Culture   Final    NO GROWTH 5 DAYS Performed at [facility], [address], [place], [place] [address]    Report Status 07/10/2018 FINAL  Final  MRSA PCR Screening     Status:  None   Collection Time: 07/05/18  6:14 AM   Specimen: Nasal Mucosa; Nasopharyngeal  Result Value Ref Range Status   MRSA by PCR NEGATIVE NEGATIVE Final    Comment:        The GeneXpert MRSA Assay (FDA approved for NASAL specimens only), is one component of a comprehensive MRSA colonization surveillance program. It is not intended to diagnose MRSA infection nor to guide or monitor treatment for MRSA infections. Performed at [facility], [address] [place], [place] [address]          Radiology Studies: No results found.      Scheduled Meds: . arformoterol  15 mcg Nebulization BID  . bisoprolol  2.5 mg Oral Daily  . budesonide (PULMICORT) nebulizer solution  0.5 mg Nebulization BID  . buPROPion  100 mg Oral q morning - 10a  . cephALEXin  500 mg Oral Q8H  . citalopram  10 mg Oral Daily  . enoxaparin (LOVENOX) injection  110 mg Subcutaneous Q24H  . enoxaparin   Does not apply Once  . ferrous sulfate  325 mg Oral Q breakfast  . folic acid  1 mg Oral Daily  . ipratropium-albuterol  3 mL Nebulization TID  . levothyroxine  125 mcg Oral Q0600  . methylPREDNISolone (SOLU-MEDROL) injection  40 mg Intravenous Q12H  . rosuvastatin  5 mg Oral q1800  . ticagrelor  60 mg Oral BID  . warfarin  7.5 mg Oral Once  . Warfarin - Pharmacist Dosing Inpatient   Does not apply q1800   Continuous Infusions:   LOS: 5 days    Time spent: 30 minutes    Teyla Skidgel Darleen Crocker, DO [facility] Pager [phone]  If 7PM-7AM, please contact night-coverage [email or web address] Password [email or web address] 07/10/2018, 2:05 PM

## 2018-07-10 NOTE — Progress Notes (Addendum)
ANTICOAGULATION CONSULT NOTE  Pharmacy Consult:  lovenox and Coumadin Indication: factor V deficiency- history of PE/DVT  Allergies  Allergen Reactions  . Dilaudid [Hydromorphone Hcl] Hives and Nausea Only  . Minocycline Hcl     REACTION: Dizzy  . Prednisone     REACTION: feels like throat swelling, hallucinations  . Varenicline Tartrate     REACTION: Dizzy(chantix)   . Zocor [Simvastatin - High Dose] Other (See Comments)    myalgia    Patient Measurements: Height: 5\' 4"  (162.6 cm) Weight: 154 lb 8.7 oz (70.1 kg) IBW/kg (Calculated) : 54.7 HEPARIN DW (KG): 68.8  Vital Signs: Temp: 99.1 F (37.3 C) (07/01 0523) Temp Source: Oral (07/01 0523) BP: 154/66 (07/01 0523) Pulse Rate: 64 (07/01 0523)  Labs: Recent Labs    07/08/18 0433 07/09/18 0416 07/10/18 0434  HGB 8.3* 8.0* 8.8*  HCT 27.9* 26.6* 30.1*  PLT 160 154 165  LABPROT  --  14.3 15.0  INR  --  1.1 1.2  HEPARINUNFRC 0.19*  --   --   CREATININE  --   --  0.61    Estimated Creatinine Clearance: 64.7 mL/min (by C-G formula based on SCr of 0.61 mg/dL).   Assessment: Patien presented to ED with SOB. She is being treated for a COPD exacerbation. 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  Coumadin was held at the time of admission due to reports of hematemesis but no evidence of this since admission. Hemoglobin today is 8.8. INR 1.2  Pharmacy asked to restart coumadin and bridge with lovenox.   Coumadin Home dose is 6mg  on Sunday, Tues, Thursday and Saturday, and 5mg  on Monday, Wed, Friday.  Goal of Therapy:  INR 2-3 Monitor platelets by anticoagulation protocol: Yes   Plan:  Coumadin 7.5 mg po x 1 today lovenox 1.5mg /kg(110mg ) sq q24h Daily PT-INR Continue to monitor H&H and platelets  Thanks for allowing pharmacy to be a part of this patient's care.  Margot Ables, PharmD Clinical Pharmacist 07/10/2018 8:44 AM

## 2018-07-11 LAB — CBC
HCT: 29.3 % — ABNORMAL LOW (ref 36.0–46.0)
Hemoglobin: 8.8 g/dL — ABNORMAL LOW (ref 12.0–15.0)
MCH: 28.7 pg (ref 26.0–34.0)
MCHC: 30 g/dL (ref 30.0–36.0)
MCV: 95.4 fL (ref 80.0–100.0)
Platelets: 194 10*3/uL (ref 150–400)
RBC: 3.07 MIL/uL — ABNORMAL LOW (ref 3.87–5.11)
RDW: 16 % — ABNORMAL HIGH (ref 11.5–15.5)
WBC: 8.1 10*3/uL (ref 4.0–10.5)
nRBC: 0 % (ref 0.0–0.2)

## 2018-07-11 LAB — PROTIME-INR
INR: 1.4 — ABNORMAL HIGH (ref 0.8–1.2)
Prothrombin Time: 17.3 seconds — ABNORMAL HIGH (ref 11.4–15.2)

## 2018-07-11 MED ORDER — FERROUS SULFATE 325 (65 FE) MG PO TABS: 325.0000 mg | ORAL_TABLET | Freq: Every day | ORAL | 0 refills | Status: DC

## 2018-07-11 MED ORDER — ENOXAPARIN SODIUM 120 MG/0.8ML ~~LOC~~ SOLN: 110.0000 mg | SUBCUTANEOUS | 0 refills | Status: DC

## 2018-07-11 MED ORDER — WARFARIN SODIUM 5 MG PO TABS: 10.0000 mg | ORAL_TABLET | Freq: Once | ORAL | Status: DC

## 2018-07-11 MED ORDER — ROSUVASTATIN CALCIUM 5 MG PO TABS: 5.0000 mg | ORAL_TABLET | Freq: Every day | ORAL | 0 refills | Status: DC

## 2018-07-11 MED ORDER — BISOPROLOL FUMARATE 5 MG PO TABS: 2.5000 mg | ORAL_TABLET | Freq: Every day | ORAL | 0 refills | Status: DC

## 2018-07-11 NOTE — Discharge Summary (Addendum)
Physician Discharge Summary  [name] DTO:[id number] DOB: 01-27-1950 DOA: 07/04/2018  PCP: [name], NP  Admit date: 07/04/2018  Discharge date: 07/11/2018  Admitted From:Home  Disposition:  Home  Recommendations for Outpatient Follow-up:  1. Follow up with PCP in 5 days 2. F/u with Dr. Bronson Ing as scheduled. Remain on Brilinta, bisoprolol and statin as prescribed. 3. Continue on Lovenox and Coumadin bridging and follow-up INR levels 4. Patient is unfortunately a high risk for readmission as she refuses SNF and does not qualify for it based on PT evaluation.  Home health agencies will not go to her home as well due to poor living conditions. 5. Her medication costs have been confirmed prior to discharge and are affordable for her.  CSW will work on ensuring that patient can receive these medications as she cannot drive.Addendum: medications sent to [facility] and she will receive medications free of charge and they will be delivered to her home.  Home Health: None  Equipment/Devices: Has home oxygen 3 L  Discharge Condition: Stable  CODE STATUS: Full  Diet recommendation: Heart Healthy  Brief/Interim Summary: As per H&P written by Dr. Darrick Meigs on 07/05/2018. 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 As per ED notes EMS was called by patient's friend today as she was having hematemesis. Patient has not been alert and awake over past few days. EMS reported that patient had periods ofapnea en route to ED. O2 sats dropped to 83% and patient was put on nonrebreather mask. ABG showed pH 7.252, PCO2 97.7, PO2 316 bicarb 36.1. Patient was put on BiPAP. SARS coronavirus 2  is negative She denies chest pain. Denies nausea vomiting. Stool guaiac in the ED is negative.  Patient was treated with acute on chronic hypoxemic respiratory failure with COPD exacerbation.  She has tapered her steroids and has no further dyspnea.  She is overall doing well and has nebulizer at home as needed as well as home oxygen 3 L.  She is noted to have subtherapeutic INR and did not bridge with Lovenox properly to Coumadin previously.  She has been undergoing a bridge while here with pharmacy, but continues to have INR 1.4 and will require ongoing Lovenox injections at home which have been prescribed for her.  She is also noted to have lower extremity cellulitis and was treated with a 7-day course of antibiotics with combination of Rocephin that was tapered to Keflex and has finished these medications at this time.  She was also noted to have elevated troponin in the setting of known CAD, but with no chest pain.  She is seen by cardiology with no further recommendations noted at this time.  2D echocardiogram performed with no wall motion abnormalities and preserved ejection fraction noted.  She will remain on medical management with bisoprolol, Brilinta, and low-dose statin.  Finally, she was noted to have some chronic anemia with hematemesis and has had recent endoscopic evaluation with no signs of acute bleeding.  Hemoglobin continues to remain stable and she is tolerating diet with no issues or concerns.  She is otherwise currently stable for discharge with no other acute events noted throughout the course of this admission.  Discharge Diagnoses:  Active Problems:   Acute on chronic respiratory failure (HCC)   Hematemesis  Elevated troponin  Principal discharge diagnosis: Acute on chronic hypoxemic respiratory failure secondary to COPD exacerbation.  Discharge Instructions  Discharge Instructions    Diet - low sodium heart healthy   Complete by: As directed    Increase activity  slowly   Complete by: As directed      Allergies as of 07/11/2018      Reactions   Dilaudid [hydromorphone Hcl] Hives, Nausea Only   Minocycline Hcl    REACTION: Dizzy   Prednisone    REACTION: feels like throat swelling, hallucinations   Varenicline Tartrate    REACTION: Dizzy(chantix)   Zocor [simvastatin - High Dose] Other (See Comments)   myalgia      Medication List    STOP taking these medications   enoxaparin 100 MG/ML injection Commonly known as: LOVENOX Replaced by: enoxaparin 120 MG/0.8ML injection   ferrous sulfate 324 (65 Fe) MG Tbec Replaced by: ferrous sulfate 325 (65 FE) MG tablet     TAKE these medications   albuterol (2.5 MG/3ML) 0.083% nebulizer solution Commonly known as: PROVENTIL USE 1 VIAL BY NEBULIZER EVERY 4 HOURS AS NEEDED FOR WHEEZING. DX: J44.9 What changed:   how much to take  how to take this  when to take this  reasons to take this  additional instructions   arformoterol 15 MCG/2ML Nebu Commonly known as: BROVANA Take 2 mLs (15 mcg total) by nebulization 2 (two) times daily.   baclofen 10 MG tablet Commonly known as: LIORESAL Take 1 tablet (10 mg total) by mouth 3 (three) times daily.   bisacodyl 5 MG EC tablet Commonly known as: DULCOLAX Take 1 tablet (5 mg total) by mouth daily as needed for mild constipation.   bisoprolol 5 MG tablet Commonly known as: ZEBETA Take 0.5 tablets (2.5 mg total) by mouth daily. Start taking on: July 12, 2018   budesonide 0.5 MG/2ML nebulizer solution Commonly known as: Pulmicort Take 2 mLs (0.5 mg total) by nebulization 2 (two) times daily. Dx: J43.9   buPROPion 100 MG 12 hr tablet Commonly known as: WELLBUTRIN SR Take 100 mg by mouth every morning.   citalopram 10 MG tablet Commonly known as: CELEXA Take 1 tablet by mouth daily.   Dry Eye Relief Drops 0.2-0.2-1 % Soln Generic drug: Glycerin-Hypromellose-PEG 400 Place 1-2 drops into both eyes 2 (two) times a day.   enoxaparin 120  MG/0.8ML injection Commonly known as: LOVENOX Inject 0.73 mLs (110 mg total) into the skin daily for 10 days. Replaces: enoxaparin 100 MG/ML injection   ferrous sulfate 325 (65 FE) MG tablet Take 1 tablet (325 mg total) by mouth daily with breakfast. Replaces: ferrous sulfate 324 (65 Fe) MG Tbec   Fish Oil 600 MG Caps Take 1 capsule by mouth daily.   Flutter Devi Use as directed   HYDROcodone-acetaminophen 5-325 MG tablet Commonly known as: NORCO/VICODIN Take 1 tablet by mouth every 6 (six) hours as needed (BREAKTHROUGH PAIN).   ipratropium-albuterol 0.5-2.5 (3) MG/3ML Soln Commonly known as: DUONEB Take 3 mLs by nebulization 4 (four) times daily.   levothyroxine 125 MCG tablet Commonly known as: SYNTHROID Take 1 tablet (125 mcg total) by mouth daily.   nitroGLYCERIN 0.4 MG SL tablet Commonly known as: NITROSTAT Place 1 tablet (0.4 mg total) under the tongue every 5 (five) minutes as needed for chest pain.   pantoprazole 40 MG tablet Commonly known as: PROTONIX Take 1 tablet (40 mg total) by mouth daily.   rosuvastatin 5 MG tablet Commonly known as: CRESTOR Take  1 tablet (5 mg total) by mouth daily at 6 PM.   ticagrelor 60 MG Tabs tablet Commonly known as: Brilinta Take 1 tablet (60 mg total) by mouth 2 (two) times daily.   traZODone 50 MG tablet Commonly known as: DESYREL Take 50 mg by mouth at bedtime as needed for sleep.   warfarin 1 MG tablet Commonly known as: COUMADIN Take as directed. If you are unsure how to take this medication, talk to your nurse or doctor. Original instructions: Take 5-6 mg by mouth See admin instructions. On Sunday, Tuesday, Thursday, and Saturday, patient takes 6mg ; on all other days, patient takes 5mg       Follow-up Information    Nafziger, Tommi Rumps, NP Follow up in 5 day(s).   Specialty: Family Medicine Contact information: [place] [address] [phone]        Herminio Commons, MD .    Specialty: Cardiology Contact information: [place] [address] [phone]          Allergies  Allergen Reactions  . Dilaudid [Hydromorphone Hcl] Hives and Nausea Only  . Minocycline Hcl     REACTION: Dizzy  . Prednisone     REACTION: feels like throat swelling, hallucinations  . Varenicline Tartrate     REACTION: Dizzy(chantix)   . Zocor [Simvastatin - High Dose] Other (See Comments)    myalgia    Consultations:  Cardiology   Procedures/Studies: Ct Abdomen Pelvis W Contrast  Result Date: 07/05/2018 CLINICAL DATA:  Nausea and vomiting.  Hematemesis. EXAM: CT ABDOMEN AND PELVIS WITH CONTRAST TECHNIQUE: Multidetector CT imaging of the abdomen and pelvis was performed using the standard protocol following bolus administration of intravenous contrast. CONTRAST:  128mL OMNIPAQUE IOHEXOL 300 MG/ML  SOLN COMPARISON:  None similar FINDINGS: Lower chest: Extensive opacity in the left lower lobe superimposed on curvilinear subpleural scarring. Hepatobiliary: No focal liver abnormality.No evidence of biliary obstruction or stone. Pancreas: Unremarkable. Spleen: Unremarkable. Adrenals/Urinary Tract: Negative adrenals. No hydronephrosis or ureteral stone. There are 2 small left renal calculi. Unremarkable bladder. Stomach/Bowel: No obstruction. No appendicitis. Two hemoclips present at the level of the rectum. Sigmoid diverticulosis. Vascular/Lymphatic: No acute vascular abnormality. Atherosclerosis with fusiform dilatation of the infrarenal aorta measuring up to 3 cm. No mass or adenopathy. Reproductive:Fibroid uterus. Discrete fibroids are difficult to discretely visualize, but the largest is likely 4 cm in the intramural right body. Other: No ascites or pneumoperitoneum. Musculoskeletal: No acute abnormalities. Remote L1, L3, and L5 superior endplate fractures. IMPRESSION: 1. Partially visualized left lower lobe opacity likely infectious/inflammatory given this area was  relatively clear on recent chest imaging. Recommend follow-up to clearing. 2. No acute finding in the abdomen. 3. 3 cm infrarenal aortic aneurysm. Recommend followup by ultrasound in 3 years. This recommendation follows ACR consensus guidelines: White Paper of the ACR Incidental Findings Committee II on Vascular Findings. J Am Coll Radiol 2013; 00:938-182 4. Sigmoid diverticulosis and left nephrolithiasis. Electronically Signed   By: Monte Fantasia M.D.   On: 07/05/2018 06:23   Dg Chest Portable 1 View  Result Date: 07/04/2018 CLINICAL DATA:  Shortness of breath EXAM: PORTABLE CHEST 1 VIEW COMPARISON:  05/23/2018, 02/15/2018, 09/19/2017 FINDINGS: Mild cardiomegaly. Stable scarring at the bilateral lung bases. Slight increase retrocardiac opacity. Aortic atherosclerosis. No pneumothorax. IMPRESSION: 1. Stable pleural scarring at the bases. Slight increased left retrocardiac opacity, possible atelectasis or mild pneumonia 2. Cardiomegaly Electronically Signed   By: Donavan Foil M.D.   On: 07/04/2018 21:40  Discharge Exam: Vitals:   07/11/18 0740 07/11/18 0746  BP:    Pulse:    Resp:    Temp:    SpO2: 97% 97%   Vitals:   07/11/18 0521 07/11/18 0735 07/11/18 0740 07/11/18 0746  BP: 133/68     Pulse: 64     Resp: 18     Temp: 98.1 F (36.7 C)     TempSrc:      SpO2: 97% 97% 97% 97%  Weight:      Height:        General: Pt is alert, awake, not in acute distress Cardiovascular: RRR, S1/S2 +, no rubs, no gallops Respiratory: CTA bilaterally, no wheezing, no rhonchi, on 3 L nasal cannula Abdominal: Soft, NT, ND, bowel sounds + Extremities: no edema, no cyanosis    The results of significant diagnostics from this hospitalization (including imaging, microbiology, ancillary and laboratory) are listed below for reference.     Microbiology: Recent Results (from the past 240 hour(s))  SARS Coronavirus 2 (CEPHEID- Performed in [place] hospital lab), Hosp Order     Status: None    Collection Time: 07/04/18  8:16 PM   Specimen: Nasopharyngeal Swab  Result Value Ref Range Status   SARS Coronavirus 2 NEGATIVE NEGATIVE Final    Comment: (NOTE) If result is NEGATIVE SARS-CoV-2 target nucleic acids are NOT DETECTED. The SARS-CoV-2 RNA is generally detectable in upper and lower  respiratory specimens during the acute phase of infection. The lowest  concentration of SARS-CoV-2 viral copies this assay can detect is 250  copies / mL. A negative result does not preclude SARS-CoV-2 infection  and should not be used as the sole basis for treatment or other  patient management decisions.  A negative result may occur with  improper specimen collection / handling, submission of specimen other  than nasopharyngeal swab, presence of viral mutation(s) within the  areas targeted by this assay, and inadequate number of viral copies  (<250 copies / mL). A negative result must be combined with clinical  observations, patient history, and epidemiological information. If result is POSITIVE SARS-CoV-2 target nucleic acids are DETECTED. The SARS-CoV-2 RNA is generally detectable in upper and lower  respiratory specimens dur ing the acute phase of infection.  Positive  results are indicative of active infection with SARS-CoV-2.  Clinical  correlation with patient history and other diagnostic information is  necessary to determine patient infection status.  Positive results do  not rule out bacterial infection or co-infection with other viruses. If result is PRESUMPTIVE POSTIVE SARS-CoV-2 nucleic acids MAY BE PRESENT.   A presumptive positive result was obtained on the submitted specimen  and confirmed on repeat testing.  While 2019 novel coronavirus  (SARS-CoV-2) nucleic acids may be present in the submitted sample  additional confirmatory testing may be necessary for epidemiological  and / or clinical management purposes  to differentiate between  SARS-CoV-2 and other Sarbecovirus  currently known to infect humans.  If clinically indicated additional testing with an alternate test  methodology [phone]) is advised. The SARS-CoV-2 RNA is generally  detectable in upper and lower respiratory sp ecimens during the acute  phase of infection. The expected result is Negative. Fact Sheet for Patients:  StrictlyIdeas.no Fact Sheet for Healthcare Providers: BankingDealers.co.za This test is not yet approved or cleared by the [place] FDA and has been authorized for detection and/or diagnosis of SARS-CoV-2 by FDA under an Emergency Use Authorization (EUA).  This EUA will remain in effect (meaning this  test can be used) for the duration of the COVID-19 declaration under Section 564(b)(1) of the Act, 21 U.S.C. section 360bbb-3(b)(1), unless the authorization is terminated or revoked sooner. Performed at [facility], [address], [place], [place] [address]   Culture, blood (routine x 2)     Status: None   Collection Time: 07/05/18  2:21 AM   Specimen: BLOOD RIGHT HAND  Result Value Ref Range Status   Specimen Description BLOOD RIGHT HAND  Final   Special Requests   Final    BOTTLES DRAWN AEROBIC AND ANAEROBIC Blood Culture adequate volume   Culture   Final    NO GROWTH 5 DAYS Performed at [facility], [address], [place], [place] [address]    Report Status 07/10/2018 FINAL  Final  Culture, blood (routine x 2)     Status: None   Collection Time: 07/05/18  2:29 AM   Specimen: BLOOD LEFT HAND  Result Value Ref Range Status   Specimen Description BLOOD LEFT HAND  Final   Special Requests   Final    AEROBIC BOTTLE ONLY Blood Culture results may not be optimal due to an inadequate volume of blood received in culture bottles   Culture   Final    NO GROWTH 5 DAYS Performed at [facility], [address], [place], [place] [address]    Report Status 07/10/2018 FINAL  Final  MRSA PCR Screening     Status: None    Collection Time: 07/05/18  6:14 AM   Specimen: Nasal Mucosa; Nasopharyngeal  Result Value Ref Range Status   MRSA by PCR NEGATIVE NEGATIVE Final    Comment:        The GeneXpert MRSA Assay (FDA approved for NASAL specimens only), is one component of a comprehensive MRSA colonization surveillance program. It is not intended to diagnose MRSA infection nor to guide or monitor treatment for MRSA infections. Performed at [facility], [address], [place], [place] [address]      Labs: BNP (last 3 results) Recent Labs    01/28/18 2330 05/23/18 1027 07/04/18 2020  BNP 161.0* 80.0 016.0*   Basic Metabolic Panel: Recent Labs  Lab 07/04/18 2030 07/05/18 0427 07/06/18 0441 07/10/18 0434  NA 145 145 142 141  K 4.0 4.0 3.5 4.5  CL 97* 102 99 102  CO2 38* 34* 34* 33*  GLUCOSE 99 75 129* 125*  BUN 20 19 21 19   CREATININE 0.75 0.60 0.66 0.61  CALCIUM 8.9 8.1* 8.3* 8.1*   Liver Function Tests: Recent Labs  Lab 07/04/18 2030 07/05/18 0427  AST 26 23  ALT 12 11  ALKPHOS 88 71  BILITOT 0.6 0.5  PROT 6.7 5.4*  ALBUMIN 3.4* 2.8*   Recent Labs  Lab 07/04/18 2030  LIPASE 28   No results for input(s): AMMONIA in the last 168 hours. CBC: Recent Labs  Lab 07/04/18 2030  07/07/18 0449 07/08/18 0433 07/09/18 0416 07/10/18 0434 07/11/18 0510  WBC 10.5   < > 8.7 6.6 6.6 6.5 8.1  NEUTROABS 9.0*  --   --   --   --   --   --   HGB 10.2*   < > 8.0* 8.3* 8.0* 8.8* 8.8*  HCT 34.9*   < > 26.1* 27.9* 26.6* 30.1* 29.3*  MCV 100.0   < > 95.6 97.2 96.0 98.0 95.4  PLT 192   < > 158 160 154 165 194   < > = values in this interval not displayed.  Cardiac Enzymes: No results for input(s): CKTOTAL, CKMB, CKMBINDEX, TROPONINI in the last 168 hours. BNP: Invalid input(s): POCBNP CBG: No results for input(s): GLUCAP in the last 168 hours. D-Dimer No results for input(s): DDIMER in the last 72 hours. Hgb A1c No results for input(s): HGBA1C in the last 72 hours. Lipid  Profile No results for input(s): CHOL, HDL, LDLCALC, TRIG, CHOLHDL, LDLDIRECT in the last 72 hours. Thyroid function studies No results for input(s): TSH, T4TOTAL, T3FREE, THYROIDAB in the last 72 hours.  Invalid input(s): FREET3 Anemia work up No results for input(s): VITAMINB12, FOLATE, FERRITIN, TIBC, IRON, RETICCTPCT in the last 72 hours. Urinalysis    Component Value Date/Time   COLORURINE YELLOW 05/23/2018 0934   APPEARANCEUR CLEAR 05/23/2018 0934   LABSPEC 1.014 05/23/2018 0934   PHURINE 7.0 05/23/2018 0934   GLUCOSEU NEGATIVE 05/23/2018 0934   HGBUR SMALL (A) 05/23/2018 0934   BILIRUBINUR NEGATIVE 05/23/2018 0934   BILIRUBINUR n 09/21/2016 1432   KETONESUR NEGATIVE 05/23/2018 0934   PROTEINUR 30 (A) 05/23/2018 0934   UROBILINOGEN 0.2 09/21/2016 1432   UROBILINOGEN 0.2 04/13/2009 2025   NITRITE NEGATIVE 05/23/2018 0934   LEUKOCYTESUR NEGATIVE 05/23/2018 0934   Sepsis Labs Invalid input(s): PROCALCITONIN,  WBC,  LACTICIDVEN Microbiology Recent Results (from the past 240 hour(s))  SARS Coronavirus 2 (CEPHEID- Performed in [place] hospital lab), Hosp Order     Status: None   Collection Time: 07/04/18  8:16 PM   Specimen: Nasopharyngeal Swab  Result Value Ref Range Status   SARS Coronavirus 2 NEGATIVE NEGATIVE Final    Comment: (NOTE) If result is NEGATIVE SARS-CoV-2 target nucleic acids are NOT DETECTED. The SARS-CoV-2 RNA is generally detectable in upper and lower  respiratory specimens during the acute phase of infection. The lowest  concentration of SARS-CoV-2 viral copies this assay can detect is 250  copies / mL. A negative result does not preclude SARS-CoV-2 infection  and should not be used as the sole basis for treatment or other  patient management decisions.  A negative result may occur with  improper specimen collection / handling, submission of specimen other  than nasopharyngeal swab, presence of viral mutation(s) within the  areas targeted by this  assay, and inadequate number of viral copies  (<250 copies / mL). A negative result must be combined with clinical  observations, patient history, and epidemiological information. If result is POSITIVE SARS-CoV-2 target nucleic acids are DETECTED. The SARS-CoV-2 RNA is generally detectable in upper and lower  respiratory specimens dur ing the acute phase of infection.  Positive  results are indicative of active infection with SARS-CoV-2.  Clinical  correlation with patient history and other diagnostic information is  necessary to determine patient infection status.  Positive results do  not rule out bacterial infection or co-infection with other viruses. If result is PRESUMPTIVE POSTIVE SARS-CoV-2 nucleic acids MAY BE PRESENT.   A presumptive positive result was obtained on the submitted specimen  and confirmed on repeat testing.  While 2019 novel coronavirus  (SARS-CoV-2) nucleic acids may be present in the submitted sample  additional confirmatory testing may be necessary for epidemiological  and / or clinical management purposes  to differentiate between  SARS-CoV-2 and other Sarbecovirus currently known to infect humans.  If clinically indicated additional testing with an alternate test  methodology [phone]) is advised. The SARS-CoV-2 RNA is generally  detectable in upper and lower respiratory sp ecimens during the acute  phase of infection. The expected result is Negative. Fact Sheet for  Patients:  StrictlyIdeas.no Fact Sheet for Healthcare Providers: BankingDealers.co.za This test is not yet approved or cleared by the [place] FDA and has been authorized for detection and/or diagnosis of SARS-CoV-2 by FDA under an Emergency Use Authorization (EUA).  This EUA will remain in effect (meaning this test can be used) for the duration of the COVID-19 declaration under Section 564(b)(1) of the Act, 21 U.S.C. section 360bbb-3(b)(1),  unless the authorization is terminated or revoked sooner. Performed at [facility], [address], [place], [place] [address]   Culture, blood (routine x 2)     Status: None   Collection Time: 07/05/18  2:21 AM   Specimen: BLOOD RIGHT HAND  Result Value Ref Range Status   Specimen Description BLOOD RIGHT HAND  Final   Special Requests   Final    BOTTLES DRAWN AEROBIC AND ANAEROBIC Blood Culture adequate volume   Culture   Final    NO GROWTH 5 DAYS Performed at [facility], [address], [place], [place] [address]    Report Status 07/10/2018 FINAL  Final  Culture, blood (routine x 2)     Status: None   Collection Time: 07/05/18  2:29 AM   Specimen: BLOOD LEFT HAND  Result Value Ref Range Status   Specimen Description BLOOD LEFT HAND  Final   Special Requests   Final    AEROBIC BOTTLE ONLY Blood Culture results may not be optimal due to an inadequate volume of blood received in culture bottles   Culture   Final    NO GROWTH 5 DAYS Performed at [facility], [address] [place], [place] [address]    Report Status 07/10/2018 FINAL  Final  MRSA PCR Screening     Status: None   Collection Time: 07/05/18  6:14 AM   Specimen: Nasal Mucosa; Nasopharyngeal  Result Value Ref Range Status   MRSA by PCR NEGATIVE NEGATIVE Final    Comment:        The GeneXpert MRSA Assay (FDA approved for NASAL specimens only), is one component of a comprehensive MRSA colonization surveillance program. It is not intended to diagnose MRSA infection nor to guide or monitor treatment for MRSA infections. Performed at [facility], [address], [place], [place] [address]      Time coordinating discharge: 45 minutes  SIGNED:   Rodena Goldmann, DO [facility] 07/11/2018, 12:44 PM  If 7PM-7AM, please contact night-coverage [email or web address] Password [email or web address]

## 2018-07-11 NOTE — Progress Notes (Signed)
Physical Therapy Treatment Patient Details Name: [name] MRN: [id number] DOB: November 13, 1950 Today's Date: 07/11/2018    History of Present Illness [name]  is a 68 y.o. 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  Patient is currently on BiPAP and is unable to provide any history.  As per ED notes EMS was called by patient's friend today as she was having hematemesis.  Patient has not been alert and awake over past few days.  EMS reported that patient had periods of apnea en route to ED.  O2 sats dropped to 83% and patient was put on nonrebreather mask.ABG showed pH 7.252, PCO2 97.7, PO2 316 bicarb 36.1.Patient was put on BiPAP.SARS coronavirus 2 is negativeShe denies chest pain.  Denies nausea vomiting.Stool guaiac in the ED is negative.    PT Comments    Patient agreeable for therapy and demonstrates improvement for sitting up at bedside with less labored movement, increased endurance/distance for gait training without loss of balance while on 3 LPM O2 with SpO2 averaging 97% throughout, limited mostly due to c/o fatigue and had to sit in wheelchair and wheeled back to room.  Patient tolerated sitting up in chair after therapy - NT aware.  Patient will benefit from continued physical therapy in hospital and recommended venue below to increase strength, balance, endurance for safe ADLs and gait.    Follow Up Recommendations  Home health PT;Outpatient PT     Equipment Recommendations  None recommended by PT    Recommendations for Other Services       Precautions / Restrictions Precautions Precautions: Fall Restrictions Weight Bearing Restrictions: No    Mobility  Bed Mobility Overal bed mobility: Modified Independent             General bed  mobility comments: used bed rail  Transfers Overall transfer level: Needs assistance Equipment used: None;Rolling walker (2 wheeled) Transfers: Sit to/from [employer] Transfers Sit to Stand: Supervision;Modified independent (Device/Increase time) Stand pivot transfers: Supervision       General transfer comment: good return for transferring using RW and without AD  Ambulation/Gait Ambulation/Gait assistance: Supervision;Min guard Gait Distance (Feet): 60 Feet Assistive device: Rolling walker (2 wheeled) Gait Pattern/deviations: Decreased step length - right;Decreased step length - left;Decreased stride length Gait velocity: decreased   General Gait Details: increased endurance/distance with slightly labored slow cadence without loss of balance, on 3 LPM O2 with SpO2 at 97%, once fatigued had to sit in wheelchair to be taken back to room   Stairs             Wheelchair Mobility    Modified Rankin (Stroke Patients Only)       Balance Overall balance assessment: Needs assistance Sitting-balance support: Feet supported;No upper extremity supported Sitting balance-Leahy Scale: Good Sitting balance - Comments: seated at bedside   Standing balance support: During functional activity;No upper extremity supported Standing balance-Leahy Scale: Fair Standing balance comment: fair/good using RW                            Cognition Arousal/Alertness: Awake/alert Behavior During Therapy: WFL for tasks assessed/performed Overall Cognitive Status: Within Functional Limits for tasks assessed  Exercises General Exercises - Lower Extremity Long Arc Quad: Seated;AROM;Strengthening;Both;10 reps Hip Flexion/Marching: Seated;AROM;Strengthening;Both;10 reps Toe Raises: Seated;AROM;Strengthening;Both;15 reps Heel Raises: Seated;AROM;Strengthening;Both;15 reps    General Comments        Pertinent  Vitals/Pain Pain Assessment: No/denies pain    Home Living                      Prior Function            PT Goals (current goals can now be found in the care plan section) Acute Rehab PT Goals Patient Stated Goal: return home with neighbor to assist PT Goal Formulation: With patient Time For Goal Achievement: 07/17/18 Potential to Achieve Goals: Good Progress towards PT goals: Progressing toward goals    Frequency    Min 3X/week      PT Plan Current plan remains appropriate    Co-evaluation              AM-PAC PT "6 Clicks" Mobility   Outcome Measure  Help needed turning from your back to your side while in a flat bed without using bedrails?: None Help needed moving from lying on your back to sitting on the side of a flat bed without using bedrails?: None Help needed moving to and from a bed to a chair (including a wheelchair)?: None Help needed standing up from a chair using your arms (e.g., wheelchair or bedside chair)?: None Help needed to walk in hospital room?: A Little Help needed climbing 3-5 steps with a railing? : A Lot 6 Click Score: 21    End of Session Equipment Utilized During Treatment: Oxygen Activity Tolerance: Patient tolerated treatment well;Patient limited by fatigue Patient left: in chair;with call bell/phone within reach Nurse Communication: Mobility status PT Visit Diagnosis: Unsteadiness on feet (R26.81);Other abnormalities of gait and mobility (R26.89);Muscle weakness (generalized) (M62.81)     Time: 3500-9381 PT Time Calculation (min) (ACUTE ONLY): 38 min  Charges:  $Therapeutic Exercise: 8-22 mins $Therapeutic Activity: 8-22 mins                     12:11 PM, 07/11/18 [name], MPT Physical Therapist with [facility] [phone] office [phone] mobile phone

## 2018-07-11 NOTE — TOC Transition Note (Addendum)
Transition of Care [facility]) - CM/SW Discharge Note   Patient Details  Name: [name] MRN: [id number] Date of Birth: 1950/09/16  Transition of Care [facility]) CM/SW Contact:  [name] Gully, LCSW Phone Number: 07/11/2018, 4:44 PM   Clinical Narrative:    LCSW coordinated with [name] with [facility] regarding patient's medications.  Medications were sent to [facility] and will be delivered to her the morning of 07/12/2018. Attending and patient advised of this.  LCSW signing off.       Barriers to Discharge: Unsafe home situation(Bed bugs confirmed)   Patient Goals and CMS Choice Patient states their goals for this hospitalization and ongoing recovery are:: to go home. CMS Medicare.gov Compare Post Acute Care list provided to:: Patient Choice offered to / list presented to : Patient  Discharge Placement                       Discharge Plan and Services   Discharge Planning Services: CM Consult                    Representative spoke with at DME Agency: [name]            Social Determinants of Health (SDOH) Interventions     Readmission Risk Interventions Readmission Risk Prevention Plan 05/29/2018  Transportation Screening Complete  Medication Review [employer]) Complete  HRI or [place] Complete  SW Recovery Care/Counseling Consult Complete  [place] Patient Refused  Some recent data might be hidden

## 2018-07-11 NOTE — TOC Benefit Eligibility Note (Signed)
Transition of Care [facility]) Benefit Eligibility Note    Patient Details  Name: [name] MRN: [id number] Date of Birth: July 06, 1950                           Additional Notes: Primary Insurance is Medicare A and B, No pharmacy benefits on file    [name] Phone Number: 07/11/2018, 2:33 PM

## 2018-07-11 NOTE — Progress Notes (Signed)
ANTICOAGULATION CONSULT NOTE  Pharmacy Consult:  Lovenox and Coumadin Indication: factor V deficiency- history of PE/DVT  Allergies  Allergen Reactions  . Dilaudid [Hydromorphone Hcl] Hives and Nausea Only  . Minocycline Hcl     REACTION: Dizzy  . Prednisone     REACTION: feels like throat swelling, hallucinations  . Varenicline Tartrate     REACTION: Dizzy(chantix)   . Zocor [Simvastatin - High Dose] Other (See Comments)    myalgia    Patient Measurements: Height: 5\' 4"  (162.6 cm) Weight: 154 lb 8.7 oz (70.1 kg) IBW/kg (Calculated) : 54.7 HEPARIN DW (KG): 68.8  Vital Signs: Temp: 98.1 F (36.7 C) (07/02 0521) BP: 133/68 (07/02 0521) Pulse Rate: 64 (07/02 0521)  Labs: Recent Labs    07/09/18 0416 07/10/18 0434 07/11/18 0510  HGB 8.0* 8.8* 8.8*  HCT 26.6* 30.1* 29.3*  PLT 154 165 194  LABPROT 14.3 15.0 17.3*  INR 1.1 1.2 1.4*  CREATININE  --  0.61  --     Estimated Creatinine Clearance: 64.7 mL/min (by C-G formula based on SCr of 0.61 mg/dL).   Assessment: Patien presented to ED with SOB. She is being treated for a COPD exacerbation. 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  Coumadin was held at the time of admission due to reports of hematemesis but no evidence of this since admission.   Pharmacy asked to restart warfarin and bridge with Lovenox.   Warfarin home dose:  6mg  on Sunday, Tues, Thursday and Saturday, and 5mg  on Monday, Wed, Friday.  Goal of Therapy:  INR 2-3 Monitor platelets by anticoagulation protocol: Yes   Plan:  Give warfarin 10mg  po x 1 dose today (60% boost  over home dose) Continue Lovenox 110mg  sub-q q24h Daily PT-INR Continue to monitor H&H and platelets  Thanks for allowing pharmacy to be a part of this patient's care. Despina Pole, Pharm. D. Clinical Pharmacist 07/11/2018 9:12 AM

## 2018-07-16 ENCOUNTER — Emergency Department (HOSPITAL_COMMUNITY): Payer: Medicare Other

## 2018-07-16 ENCOUNTER — Other Ambulatory Visit: Payer: Self-pay

## 2018-07-16 ENCOUNTER — Inpatient Hospital Stay (HOSPITAL_COMMUNITY)
Admission: EM | Admit: 2018-07-16 | Discharge: 2018-08-12 | DRG: 377 | Disposition: A | Discharge status: Skilled Nursing Facility | Payer: Medicare Other | Attending: Internal Medicine | Admitting: Internal Medicine

## 2018-07-16 ENCOUNTER — Encounter (HOSPITAL_COMMUNITY): Payer: Self-pay | Admitting: *Deleted

## 2018-07-16 DIAGNOSIS — R0902 Hypoxemia: Secondary | ICD-10-CM | POA: Diagnosis not present

## 2018-07-16 DIAGNOSIS — G253 Myoclonus: Secondary | ICD-10-CM | POA: Diagnosis not present

## 2018-07-16 DIAGNOSIS — I469 Cardiac arrest, cause unspecified: Secondary | ICD-10-CM | POA: Diagnosis not present

## 2018-07-16 DIAGNOSIS — D6851 Activated protein C resistance: Secondary | ICD-10-CM | POA: Diagnosis not present

## 2018-07-16 DIAGNOSIS — Z86718 Personal history of other venous thrombosis and embolism: Secondary | ICD-10-CM

## 2018-07-16 DIAGNOSIS — T17590A Other foreign object in bronchus causing asphyxiation, initial encounter: Secondary | ICD-10-CM | POA: Diagnosis not present

## 2018-07-16 DIAGNOSIS — J942 Hemothorax: Secondary | ICD-10-CM | POA: Diagnosis not present

## 2018-07-16 DIAGNOSIS — A4159 Other Gram-negative sepsis: Secondary | ICD-10-CM | POA: Diagnosis not present

## 2018-07-16 DIAGNOSIS — I480 Paroxysmal atrial fibrillation: Secondary | ICD-10-CM | POA: Diagnosis not present

## 2018-07-16 DIAGNOSIS — J439 Emphysema, unspecified: Secondary | ICD-10-CM | POA: Diagnosis present

## 2018-07-16 DIAGNOSIS — J9811 Atelectasis: Secondary | ICD-10-CM | POA: Diagnosis not present

## 2018-07-16 DIAGNOSIS — R578 Other shock: Secondary | ICD-10-CM | POA: Diagnosis not present

## 2018-07-16 DIAGNOSIS — Z9981 Dependence on supplemental oxygen: Secondary | ICD-10-CM

## 2018-07-16 DIAGNOSIS — I493 Ventricular premature depolarization: Secondary | ICD-10-CM | POA: Diagnosis not present

## 2018-07-16 DIAGNOSIS — K920 Hematemesis: Secondary | ICD-10-CM

## 2018-07-16 DIAGNOSIS — I252 Old myocardial infarction: Secondary | ICD-10-CM | POA: Diagnosis not present

## 2018-07-16 DIAGNOSIS — Z86711 Personal history of pulmonary embolism: Secondary | ICD-10-CM

## 2018-07-16 DIAGNOSIS — K56609 Unspecified intestinal obstruction, unspecified as to partial versus complete obstruction: Secondary | ICD-10-CM

## 2018-07-16 DIAGNOSIS — J90 Pleural effusion, not elsewhere classified: Secondary | ICD-10-CM

## 2018-07-16 DIAGNOSIS — J9621 Acute and chronic respiratory failure with hypoxia: Secondary | ICD-10-CM

## 2018-07-16 DIAGNOSIS — Z825 Family history of asthma and other chronic lower respiratory diseases: Secondary | ICD-10-CM

## 2018-07-16 DIAGNOSIS — Z820 Family history of epilepsy and other diseases of the nervous system: Secondary | ICD-10-CM

## 2018-07-16 DIAGNOSIS — R0600 Dyspnea, unspecified: Secondary | ICD-10-CM

## 2018-07-16 DIAGNOSIS — E875 Hyperkalemia: Secondary | ICD-10-CM | POA: Diagnosis not present

## 2018-07-16 DIAGNOSIS — Z79891 Long term (current) use of opiate analgesic: Secondary | ICD-10-CM

## 2018-07-16 DIAGNOSIS — R0603 Acute respiratory distress: Secondary | ICD-10-CM

## 2018-07-16 DIAGNOSIS — I2510 Atherosclerotic heart disease of native coronary artery without angina pectoris: Secondary | ICD-10-CM | POA: Diagnosis not present

## 2018-07-16 DIAGNOSIS — Z8051 Family history of malignant neoplasm of kidney: Secondary | ICD-10-CM

## 2018-07-16 DIAGNOSIS — J918 Pleural effusion in other conditions classified elsewhere: Secondary | ICD-10-CM | POA: Diagnosis not present

## 2018-07-16 DIAGNOSIS — J80 Acute respiratory distress syndrome: Secondary | ICD-10-CM | POA: Diagnosis not present

## 2018-07-16 DIAGNOSIS — R58 Hemorrhage, not elsewhere classified: Secondary | ICD-10-CM | POA: Diagnosis not present

## 2018-07-16 DIAGNOSIS — R918 Other nonspecific abnormal finding of lung field: Secondary | ICD-10-CM | POA: Diagnosis present

## 2018-07-16 DIAGNOSIS — Z79890 Hormone replacement therapy (postmenopausal): Secondary | ICD-10-CM

## 2018-07-16 DIAGNOSIS — J9690 Respiratory failure, unspecified, unspecified whether with hypoxia or hypercapnia: Secondary | ICD-10-CM

## 2018-07-16 DIAGNOSIS — Z7901 Long term (current) use of anticoagulants: Secondary | ICD-10-CM

## 2018-07-16 DIAGNOSIS — Z4659 Encounter for fitting and adjustment of other gastrointestinal appliance and device: Secondary | ICD-10-CM

## 2018-07-16 DIAGNOSIS — Z7951 Long term (current) use of inhaled steroids: Secondary | ICD-10-CM

## 2018-07-16 DIAGNOSIS — X58XXXA Exposure to other specified factors, initial encounter: Secondary | ICD-10-CM | POA: Diagnosis not present

## 2018-07-16 DIAGNOSIS — R451 Restlessness and agitation: Secondary | ICD-10-CM | POA: Diagnosis not present

## 2018-07-16 DIAGNOSIS — Z781 Physical restraint status: Secondary | ICD-10-CM

## 2018-07-16 DIAGNOSIS — D62 Acute posthemorrhagic anemia: Secondary | ICD-10-CM | POA: Diagnosis not present

## 2018-07-16 DIAGNOSIS — J9600 Acute respiratory failure, unspecified whether with hypoxia or hypercapnia: Secondary | ICD-10-CM

## 2018-07-16 DIAGNOSIS — E871 Hypo-osmolality and hyponatremia: Secondary | ICD-10-CM | POA: Diagnosis not present

## 2018-07-16 DIAGNOSIS — J9601 Acute respiratory failure with hypoxia: Secondary | ICD-10-CM

## 2018-07-16 DIAGNOSIS — Z885 Allergy status to narcotic agent status: Secondary | ICD-10-CM

## 2018-07-16 DIAGNOSIS — Z6826 Body mass index (BMI) 26.0-26.9, adult: Secondary | ICD-10-CM

## 2018-07-16 DIAGNOSIS — Z20828 Contact with and (suspected) exposure to other viral communicable diseases: Secondary | ICD-10-CM | POA: Diagnosis not present

## 2018-07-16 DIAGNOSIS — R0602 Shortness of breath: Secondary | ICD-10-CM

## 2018-07-16 DIAGNOSIS — I10 Essential (primary) hypertension: Secondary | ICD-10-CM | POA: Diagnosis present

## 2018-07-16 DIAGNOSIS — Z832 Family history of diseases of the blood and blood-forming organs and certain disorders involving the immune mechanism: Secondary | ICD-10-CM

## 2018-07-16 DIAGNOSIS — Z888 Allergy status to other drugs, medicaments and biological substances status: Secondary | ICD-10-CM

## 2018-07-16 DIAGNOSIS — Z7902 Long term (current) use of antithrombotics/antiplatelets: Secondary | ICD-10-CM

## 2018-07-16 DIAGNOSIS — Z881 Allergy status to other antibiotic agents status: Secondary | ICD-10-CM

## 2018-07-16 DIAGNOSIS — Z955 Presence of coronary angioplasty implant and graft: Secondary | ICD-10-CM

## 2018-07-16 DIAGNOSIS — E669 Obesity, unspecified: Secondary | ICD-10-CM | POA: Diagnosis present

## 2018-07-16 DIAGNOSIS — R571 Hypovolemic shock: Secondary | ICD-10-CM

## 2018-07-16 DIAGNOSIS — R1312 Dysphagia, oropharyngeal phase: Secondary | ICD-10-CM | POA: Diagnosis not present

## 2018-07-16 DIAGNOSIS — Z209 Contact with and (suspected) exposure to unspecified communicable disease: Secondary | ICD-10-CM | POA: Diagnosis not present

## 2018-07-16 DIAGNOSIS — F17210 Nicotine dependence, cigarettes, uncomplicated: Secondary | ICD-10-CM | POA: Diagnosis present

## 2018-07-16 DIAGNOSIS — G894 Chronic pain syndrome: Secondary | ICD-10-CM | POA: Diagnosis present

## 2018-07-16 DIAGNOSIS — J690 Pneumonitis due to inhalation of food and vomit: Secondary | ICD-10-CM | POA: Diagnosis not present

## 2018-07-16 DIAGNOSIS — J9622 Acute and chronic respiratory failure with hypercapnia: Secondary | ICD-10-CM | POA: Diagnosis not present

## 2018-07-16 DIAGNOSIS — K5909 Other constipation: Secondary | ICD-10-CM | POA: Diagnosis not present

## 2018-07-16 DIAGNOSIS — Z8249 Family history of ischemic heart disease and other diseases of the circulatory system: Secondary | ICD-10-CM

## 2018-07-16 DIAGNOSIS — E785 Hyperlipidemia, unspecified: Secondary | ICD-10-CM | POA: Diagnosis present

## 2018-07-16 DIAGNOSIS — E876 Hypokalemia: Secondary | ICD-10-CM | POA: Diagnosis not present

## 2018-07-16 DIAGNOSIS — J189 Pneumonia, unspecified organism: Secondary | ICD-10-CM

## 2018-07-16 DIAGNOSIS — F329 Major depressive disorder, single episode, unspecified: Secondary | ICD-10-CM | POA: Diagnosis present

## 2018-07-16 DIAGNOSIS — Z978 Presence of other specified devices: Secondary | ICD-10-CM

## 2018-07-16 DIAGNOSIS — Z66 Do not resuscitate: Secondary | ICD-10-CM | POA: Diagnosis not present

## 2018-07-16 DIAGNOSIS — K922 Gastrointestinal hemorrhage, unspecified: Secondary | ICD-10-CM

## 2018-07-16 DIAGNOSIS — Z01818 Encounter for other preprocedural examination: Secondary | ICD-10-CM

## 2018-07-16 DIAGNOSIS — E039 Hypothyroidism, unspecified: Secondary | ICD-10-CM

## 2018-07-16 DIAGNOSIS — Z79899 Other long term (current) drug therapy: Secondary | ICD-10-CM

## 2018-07-16 DIAGNOSIS — F419 Anxiety disorder, unspecified: Secondary | ICD-10-CM | POA: Diagnosis present

## 2018-07-16 DIAGNOSIS — Z9889 Other specified postprocedural states: Secondary | ICD-10-CM

## 2018-07-16 LAB — CBC
HCT: 32.3 % — ABNORMAL LOW (ref 36.0–46.0)
Hemoglobin: 9 g/dL — ABNORMAL LOW (ref 12.0–15.0)
MCH: 28.8 pg (ref 26.0–34.0)
MCHC: 27.9 g/dL — ABNORMAL LOW (ref 30.0–36.0)
MCV: 103.5 fL — ABNORMAL HIGH (ref 80.0–100.0)
Platelets: 399 10*3/uL (ref 150–400)
RBC: 3.12 MIL/uL — ABNORMAL LOW (ref 3.87–5.11)
RDW: 16.1 % — ABNORMAL HIGH (ref 11.5–15.5)
WBC: 9.7 10*3/uL (ref 4.0–10.5)
nRBC: 0 % (ref 0.0–0.2)

## 2018-07-16 MED ORDER — SODIUM BICARBONATE 8.4 % IV SOLN
INTRAVENOUS | Status: AC | PRN
  Administered 2018-07-16: 100 meq via INTRAVENOUS

## 2018-07-16 MED ORDER — PANTOPRAZOLE SODIUM 40 MG IV SOLR
INTRAVENOUS | Status: AC
  Filled 2018-07-16: qty 160

## 2018-07-16 MED ORDER — SODIUM CHLORIDE 0.9 % IV SOLN
80.0000 mg | Freq: Once | INTRAVENOUS | Status: AC
  Administered 2018-07-17: 80 mg via INTRAVENOUS
  Filled 2018-07-16: qty 80

## 2018-07-16 MED ORDER — SODIUM CHLORIDE 0.9 % IV BOLUS
1000.0000 mL | Freq: Once | INTRAVENOUS | Status: AC
  Administered 2018-07-16: 1000 mL via INTRAVENOUS

## 2018-07-16 MED ORDER — SODIUM CHLORIDE 0.9 % IV SOLN
2.0000 g | Freq: Once | INTRAVENOUS | Status: AC
  Administered 2018-07-16: 2 g via INTRAVENOUS
  Filled 2018-07-16: qty 20

## 2018-07-16 MED ORDER — VITAMIN K1 10 MG/ML IJ SOLN
10.0000 mg | Freq: Once | INTRAVENOUS | Status: AC
  Administered 2018-07-17: 10 mg via INTRAVENOUS
  Filled 2018-07-16: qty 1

## 2018-07-16 MED ORDER — ROCURONIUM BROMIDE 50 MG/5ML IV SOLN
INTRAVENOUS | Status: DC | PRN
  Administered 2018-07-16: 80 mg via INTRAVENOUS

## 2018-07-16 MED ORDER — PROPOFOL 1000 MG/100ML IV EMUL
INTRAVENOUS | Status: AC
  Administered 2018-07-17: 01:00:00
  Filled 2018-07-16: qty 100

## 2018-07-16 MED ORDER — SODIUM CHLORIDE 0.9 % IV SOLN
50.0000 ug/h | INTRAVENOUS | Status: DC
  Filled 2018-07-16 (×2): qty 1

## 2018-07-16 MED ORDER — OCTREOTIDE ACETATE 500 MCG/ML IJ SOLN
INTRAMUSCULAR | Status: AC
  Filled 2018-07-16: qty 1

## 2018-07-16 MED ORDER — OCTREOTIDE LOAD VIA INFUSION
50.0000 ug | Freq: Once | INTRAVENOUS | Status: AC
  Administered 2018-07-17: 50 ug via INTRAVENOUS
  Filled 2018-07-16: qty 25

## 2018-07-16 MED ORDER — EPINEPHRINE 1 MG/10ML IJ SOSY
PREFILLED_SYRINGE | INTRAMUSCULAR | Status: AC | PRN
  Administered 2018-07-16 (×3): 1 mg via INTRAVENOUS

## 2018-07-16 MED ORDER — PROTHROMBIN COMPLEX CONC HUMAN 500 UNITS IV KIT
1500.0000 [IU] | PACK | Freq: Once | Status: AC
  Administered 2018-07-17: 1500 [IU] via INTRAVENOUS
  Filled 2018-07-16: qty 1500

## 2018-07-16 MED ORDER — SODIUM CHLORIDE 0.9 % IV SOLN
8.0000 mg/h | INTRAVENOUS | Status: DC
  Administered 2018-07-17 – 2018-07-18 (×5): 8 mg/h via INTRAVENOUS
  Filled 2018-07-16 (×6): qty 80

## 2018-07-16 NOTE — Code Documentation (Signed)
CPR continues, rhythm PEA. Dr Sedonia Small at bedside,

## 2018-07-16 NOTE — Code Documentation (Signed)
Pt had return of pulses, pt intubated, resp at bedside,

## 2018-07-16 NOTE — ED Notes (Signed)
Pt vomited large amount of brown colored emesis, Dr Sedonia Small at bedside,

## 2018-07-16 NOTE — ED Provider Notes (Signed)
I came in to assist with patient care. I was involved only for the procedure below.  Procedures .Central Line  Date/Time: 07/16/2018 11:15 PM Performed by: Delora Fuel, MD Authorized by: Delora Fuel, MD   Consent:    Consent obtained:  Emergent situation   Consent given by: implied consent. Pre-procedure details:    Hand hygiene: Hand hygiene performed prior to insertion     Skin preparation:  2% chlorhexidine   Skin preparation agent: Skin preparation agent completely dried prior to procedure   Anesthesia (see MAR for exact dosages):    Anesthesia method:  None Procedure details:    Location:  R femoral   Site selection rationale:  Access to site while CPR was being done   Patient position:  Flat   Procedural supplies:  Triple lumen   Catheter size:  7.5 Fr   Landmarks identified: yes     Ultrasound guidance: no     Number of attempts:  2   Successful placement: yes   Post-procedure details:    Post-procedure:  Dressing applied and line sutured   Assessment:  Blood return through all ports   Patient tolerance of procedure:  Tolerated well, no immediate complications    Delora Fuel, MD 98/33/82 [id number]

## 2018-07-16 NOTE — ED Triage Notes (Signed)
Pt arrived to er by ems, color gray, ashen, resp distress noted, per ems pt had large amount of bloody emesis at residence upon their arrival to pt.

## 2018-07-17 ENCOUNTER — Other Ambulatory Visit: Payer: Self-pay

## 2018-07-17 DIAGNOSIS — I469 Cardiac arrest, cause unspecified: Secondary | ICD-10-CM | POA: Diagnosis present

## 2018-07-17 DIAGNOSIS — J9690 Respiratory failure, unspecified, unspecified whether with hypoxia or hypercapnia: Secondary | ICD-10-CM | POA: Diagnosis not present

## 2018-07-17 DIAGNOSIS — I340 Nonrheumatic mitral (valve) insufficiency: Secondary | ICD-10-CM | POA: Diagnosis not present

## 2018-07-17 DIAGNOSIS — D62 Acute posthemorrhagic anemia: Secondary | ICD-10-CM | POA: Diagnosis present

## 2018-07-17 DIAGNOSIS — R0902 Hypoxemia: Secondary | ICD-10-CM | POA: Diagnosis not present

## 2018-07-17 DIAGNOSIS — J9621 Acute and chronic respiratory failure with hypoxia: Secondary | ICD-10-CM | POA: Diagnosis present

## 2018-07-17 DIAGNOSIS — Z7189 Other specified counseling: Secondary | ICD-10-CM | POA: Diagnosis not present

## 2018-07-17 DIAGNOSIS — K922 Gastrointestinal hemorrhage, unspecified: Secondary | ICD-10-CM | POA: Diagnosis present

## 2018-07-17 DIAGNOSIS — R578 Other shock: Secondary | ICD-10-CM | POA: Diagnosis present

## 2018-07-17 DIAGNOSIS — J90 Pleural effusion, not elsewhere classified: Secondary | ICD-10-CM | POA: Diagnosis not present

## 2018-07-17 DIAGNOSIS — R1310 Dysphagia, unspecified: Secondary | ICD-10-CM | POA: Diagnosis not present

## 2018-07-17 DIAGNOSIS — I493 Ventricular premature depolarization: Secondary | ICD-10-CM | POA: Diagnosis not present

## 2018-07-17 DIAGNOSIS — J439 Emphysema, unspecified: Secondary | ICD-10-CM | POA: Diagnosis present

## 2018-07-17 DIAGNOSIS — J9601 Acute respiratory failure with hypoxia: Secondary | ICD-10-CM | POA: Diagnosis not present

## 2018-07-17 DIAGNOSIS — R571 Hypovolemic shock: Secondary | ICD-10-CM | POA: Diagnosis present

## 2018-07-17 DIAGNOSIS — R0602 Shortness of breath: Secondary | ICD-10-CM | POA: Diagnosis not present

## 2018-07-17 DIAGNOSIS — I252 Old myocardial infarction: Secondary | ICD-10-CM | POA: Diagnosis not present

## 2018-07-17 DIAGNOSIS — J9811 Atelectasis: Secondary | ICD-10-CM | POA: Diagnosis not present

## 2018-07-17 DIAGNOSIS — R042 Hemoptysis: Secondary | ICD-10-CM | POA: Diagnosis not present

## 2018-07-17 DIAGNOSIS — J810 Acute pulmonary edema: Secondary | ICD-10-CM | POA: Diagnosis not present

## 2018-07-17 DIAGNOSIS — R079 Chest pain, unspecified: Secondary | ICD-10-CM | POA: Diagnosis not present

## 2018-07-17 DIAGNOSIS — I361 Nonrheumatic tricuspid (valve) insufficiency: Secondary | ICD-10-CM | POA: Diagnosis not present

## 2018-07-17 DIAGNOSIS — R0603 Acute respiratory distress: Secondary | ICD-10-CM | POA: Diagnosis not present

## 2018-07-17 DIAGNOSIS — D682 Hereditary deficiency of other clotting factors: Secondary | ICD-10-CM | POA: Diagnosis not present

## 2018-07-17 DIAGNOSIS — I2510 Atherosclerotic heart disease of native coronary artery without angina pectoris: Secondary | ICD-10-CM | POA: Diagnosis present

## 2018-07-17 DIAGNOSIS — Z66 Do not resuscitate: Secondary | ICD-10-CM | POA: Diagnosis not present

## 2018-07-17 DIAGNOSIS — R918 Other nonspecific abnormal finding of lung field: Secondary | ICD-10-CM | POA: Diagnosis not present

## 2018-07-17 DIAGNOSIS — J918 Pleural effusion in other conditions classified elsewhere: Secondary | ICD-10-CM | POA: Diagnosis not present

## 2018-07-17 DIAGNOSIS — R5381 Other malaise: Secondary | ICD-10-CM | POA: Diagnosis not present

## 2018-07-17 DIAGNOSIS — Z20828 Contact with and (suspected) exposure to other viral communicable diseases: Secondary | ICD-10-CM | POA: Diagnosis present

## 2018-07-17 DIAGNOSIS — K920 Hematemesis: Secondary | ICD-10-CM | POA: Diagnosis present

## 2018-07-17 DIAGNOSIS — J9809 Other diseases of bronchus, not elsewhere classified: Secondary | ICD-10-CM | POA: Diagnosis not present

## 2018-07-17 DIAGNOSIS — Z515 Encounter for palliative care: Secondary | ICD-10-CM | POA: Diagnosis not present

## 2018-07-17 DIAGNOSIS — J9622 Acute and chronic respiratory failure with hypercapnia: Secondary | ICD-10-CM | POA: Diagnosis present

## 2018-07-17 DIAGNOSIS — E871 Hypo-osmolality and hyponatremia: Secondary | ICD-10-CM | POA: Diagnosis not present

## 2018-07-17 DIAGNOSIS — A4159 Other Gram-negative sepsis: Secondary | ICD-10-CM | POA: Diagnosis not present

## 2018-07-17 DIAGNOSIS — X58XXXA Exposure to other specified factors, initial encounter: Secondary | ICD-10-CM | POA: Diagnosis not present

## 2018-07-17 DIAGNOSIS — T17590A Other foreign object in bronchus causing asphyxiation, initial encounter: Secondary | ICD-10-CM | POA: Diagnosis not present

## 2018-07-17 DIAGNOSIS — D6851 Activated protein C resistance: Secondary | ICD-10-CM | POA: Diagnosis present

## 2018-07-17 DIAGNOSIS — I480 Paroxysmal atrial fibrillation: Secondary | ICD-10-CM | POA: Diagnosis not present

## 2018-07-17 DIAGNOSIS — Z9981 Dependence on supplemental oxygen: Secondary | ICD-10-CM | POA: Diagnosis not present

## 2018-07-17 DIAGNOSIS — J690 Pneumonitis due to inhalation of food and vomit: Secondary | ICD-10-CM | POA: Diagnosis present

## 2018-07-17 DIAGNOSIS — J988 Other specified respiratory disorders: Secondary | ICD-10-CM | POA: Diagnosis not present

## 2018-07-17 DIAGNOSIS — J942 Hemothorax: Secondary | ICD-10-CM | POA: Diagnosis not present

## 2018-07-17 DIAGNOSIS — G253 Myoclonus: Secondary | ICD-10-CM | POA: Diagnosis not present

## 2018-07-17 DIAGNOSIS — G9340 Encephalopathy, unspecified: Secondary | ICD-10-CM | POA: Diagnosis not present

## 2018-07-17 LAB — BLOOD GAS, ARTERIAL
Acid-Base Excess: 4.9 mmol/L — ABNORMAL HIGH (ref 0.0–2.0)
Acid-Base Excess: 7.9 mmol/L — ABNORMAL HIGH (ref 0.0–2.0)
Bicarbonate: 26.9 mmol/L (ref 20.0–28.0)
Bicarbonate: 32.9 mmol/L — ABNORMAL HIGH (ref 20.0–28.0)
Drawn by: 22223
Drawn by: 56037
FIO2: 1
FIO2: 100
MECHVT: 430 mL
MECHVT: 530 mL
O2 Saturation: 98.7 %
O2 Saturation: 98.9 %
PEEP: 5 cmH2O
Patient temperature: 37
Patient temperature: 98.6
RATE: 16 resp/min
RATE: 18 resp/min
pCO2 arterial: 55.4 mmHg — ABNORMAL HIGH (ref 32.0–48.0)
pCO2 arterial: 81.3 mmHg (ref 32.0–48.0)
pH, Arterial: 7.221 — ABNORMAL LOW (ref 7.350–7.450)
pH, Arterial: 7.391 (ref 7.350–7.450)
pO2, Arterial: 145 mmHg — ABNORMAL HIGH (ref 83.0–108.0)
pO2, Arterial: 169 mmHg — ABNORMAL HIGH (ref 83.0–108.0)

## 2018-07-17 LAB — TYPE AND SCREEN
ABO/RH(D): A POS
Antibody Screen: NEGATIVE
Unit division: 0
Unit division: 0
Unit division: 0

## 2018-07-17 LAB — CBC
HCT: 33.1 % — ABNORMAL LOW (ref 36.0–46.0)
Hemoglobin: 10.7 g/dL — ABNORMAL LOW (ref 12.0–15.0)
MCH: 30.5 pg (ref 26.0–34.0)
MCHC: 32.3 g/dL (ref 30.0–36.0)
MCV: 94.3 fL (ref 80.0–100.0)
Platelets: 231 10*3/uL (ref 150–400)
RBC: 3.51 MIL/uL — ABNORMAL LOW (ref 3.87–5.11)
RDW: 15.2 % (ref 11.5–15.5)
WBC: 18.1 10*3/uL — ABNORMAL HIGH (ref 4.0–10.5)
nRBC: 0 % (ref 0.0–0.2)

## 2018-07-17 LAB — COMPREHENSIVE METABOLIC PANEL
ALT: 15 U/L (ref 0–44)
AST: 15 U/L (ref 15–41)
Albumin: 3.1 g/dL — ABNORMAL LOW (ref 3.5–5.0)
Alkaline Phosphatase: 58 U/L (ref 38–126)
Anion gap: 7 (ref 5–15)
BUN: 15 mg/dL (ref 8–23)
CO2: 37 mmol/L — ABNORMAL HIGH (ref 22–32)
Calcium: 8 mg/dL — ABNORMAL LOW (ref 8.9–10.3)
Chloride: 97 mmol/L — ABNORMAL LOW (ref 98–111)
Creatinine, Ser: 0.86 mg/dL (ref 0.44–1.00)
GFR calc Af Amer: >60 mL/min (ref >60)
GFR calc non Af Amer: >60 mL/min (ref >60)
Glucose, Bld: 200 mg/dL — ABNORMAL HIGH (ref 70–99)
Potassium: 4.2 mmol/L (ref 3.5–5.1)
Sodium: 141 mmol/L (ref 135–145)
Total Bilirubin: 0.3 mg/dL (ref 0.3–1.2)
Total Protein: 6.4 g/dL — ABNORMAL LOW (ref 6.5–8.1)

## 2018-07-17 LAB — CBC WITH DIFFERENTIAL/PLATELET
Abs Immature Granulocytes: 0.16 10*3/uL — ABNORMAL HIGH (ref 0.00–0.07)
Basophils Absolute: 0.1 10*3/uL (ref 0.0–0.1)
Basophils Relative: 0 %
Eosinophils Absolute: 0 10*3/uL (ref 0.0–0.5)
Eosinophils Relative: 0 %
HCT: 36.4 % (ref 36.0–46.0)
Hemoglobin: 11.5 g/dL — ABNORMAL LOW (ref 12.0–15.0)
Immature Granulocytes: 1 %
Lymphocytes Relative: 1 %
Lymphs Abs: 0.2 10*3/uL — ABNORMAL LOW (ref 0.7–4.0)
MCH: 30.7 pg (ref 26.0–34.0)
MCHC: 31.6 g/dL (ref 30.0–36.0)
MCV: 97.1 fL (ref 80.0–100.0)
Monocytes Absolute: 2.2 10*3/uL — ABNORMAL HIGH (ref 0.1–1.0)
Monocytes Relative: 8 %
Neutro Abs: 23.5 10*3/uL — ABNORMAL HIGH (ref 1.7–7.7)
Neutrophils Relative %: 90 %
Platelets: 228 10*3/uL (ref 150–400)
RBC: 3.75 MIL/uL — ABNORMAL LOW (ref 3.87–5.11)
RDW: 15.2 % (ref 11.5–15.5)
WBC: 26 10*3/uL — ABNORMAL HIGH (ref 4.0–10.5)
nRBC: 0 % (ref 0.0–0.2)

## 2018-07-17 LAB — BPAM RBC
Blood Product Expiration Date: 202007182359
Blood Product Expiration Date: 202008052359
Blood Product Expiration Date: 202008052359
ISSUE DATE / TIME: 202007072309
ISSUE DATE / TIME: 202007072309
ISSUE DATE / TIME: 202007072326
Unit Type and Rh: 9500
Unit Type and Rh: 9500
Unit Type and Rh: 9500

## 2018-07-17 LAB — LIPASE, BLOOD: Lipase: 31 U/L (ref 11–51)

## 2018-07-17 LAB — BASIC METABOLIC PANEL
Anion gap: 7 (ref 5–15)
BUN: 16 mg/dL (ref 8–23)
CO2: 33 mmol/L — ABNORMAL HIGH (ref 22–32)
Calcium: 7.1 mg/dL — ABNORMAL LOW (ref 8.9–10.3)
Chloride: 102 mmol/L (ref 98–111)
Creatinine, Ser: 0.88 mg/dL (ref 0.44–1.00)
GFR calc Af Amer: >60 mL/min (ref >60)
GFR calc non Af Amer: >60 mL/min (ref >60)
Glucose, Bld: 181 mg/dL — ABNORMAL HIGH (ref 70–99)
Potassium: 4.2 mmol/L (ref 3.5–5.1)
Sodium: 142 mmol/L (ref 135–145)

## 2018-07-17 LAB — LACTIC ACID, PLASMA
Lactic Acid, Venous: 4.8 mmol/L (ref 0.5–1.9)
Lactic Acid, Venous: 1.3 mmol/L (ref 0.5–1.9)
Lactic Acid, Venous: 2.2 mmol/L (ref 0.5–1.9)
Lactic Acid, Venous: 2.7 mmol/L (ref 0.5–1.9)

## 2018-07-17 LAB — TRIGLYCERIDES: Triglycerides: 77 mg/dL (ref <150)

## 2018-07-17 LAB — MAGNESIUM: Magnesium: 1.6 mg/dL — ABNORMAL LOW (ref 1.7–2.4)

## 2018-07-17 LAB — GLUCOSE, CAPILLARY: Glucose-Capillary: 158 mg/dL — ABNORMAL HIGH (ref 70–99)

## 2018-07-17 LAB — APTT
aPTT: 43 seconds — ABNORMAL HIGH (ref 24–36)
aPTT: 34 seconds (ref 24–36)

## 2018-07-17 LAB — PROTIME-INR
INR: 1.4 — ABNORMAL HIGH (ref 0.8–1.2)
INR: 1.2 (ref 0.8–1.2)
Prothrombin Time: 16.9 seconds — ABNORMAL HIGH (ref 11.4–15.2)
Prothrombin Time: 14.6 seconds (ref 11.4–15.2)

## 2018-07-17 LAB — SARS CORONAVIRUS 2 BY RT PCR (HOSPITAL ORDER, PERFORMED IN ~~LOC~~ HOSPITAL LAB): SARS Coronavirus 2: NEGATIVE

## 2018-07-17 LAB — TSH: TSH: 12.532 u[IU]/mL — ABNORMAL HIGH (ref 0.350–4.500)

## 2018-07-17 LAB — MRSA PCR SCREENING: MRSA by PCR: NEGATIVE

## 2018-07-17 MED ORDER — ARFORMOTEROL TARTRATE 15 MCG/2ML IN NEBU
15.0000 ug | INHALATION_SOLUTION | Freq: Two times a day (BID) | RESPIRATORY_TRACT | Status: DC
  Administered 2018-07-17 – 2018-08-12 (×53): 15 ug via RESPIRATORY_TRACT
  Filled 2018-07-17 (×54): qty 2

## 2018-07-17 MED ORDER — VITAMIN K1 10 MG/ML IJ SOLN
INTRAMUSCULAR | Status: AC
  Filled 2018-07-17: qty 1

## 2018-07-17 MED ORDER — LEVOTHYROXINE SODIUM 100 MCG/5ML IV SOLN
62.5000 ug | Freq: Every day | INTRAVENOUS | Status: DC
  Administered 2018-07-17 – 2018-07-27 (×10): 62.5 ug via INTRAVENOUS
  Filled 2018-07-17 (×11): qty 5

## 2018-07-17 MED ORDER — CHLORHEXIDINE GLUCONATE 0.12% ORAL RINSE (MEDLINE KIT)
15.0000 mL | Freq: Two times a day (BID) | OROMUCOSAL | Status: DC
  Administered 2018-07-17 – 2018-07-18 (×3): 15 mL via OROMUCOSAL

## 2018-07-17 MED ORDER — FENTANYL CITRATE (PF) 100 MCG/2ML IJ SOLN: 25.0000 ug | INTRAMUSCULAR | Status: DC | PRN

## 2018-07-17 MED ORDER — ORAL CARE MOUTH RINSE
15.0000 mL | OROMUCOSAL | Status: DC
  Administered 2018-07-17 – 2018-07-18 (×12): 15 mL via OROMUCOSAL

## 2018-07-17 MED ORDER — SODIUM CHLORIDE 0.9% FLUSH
10.0000 mL | Freq: Two times a day (BID) | INTRAVENOUS | Status: DC
  Administered 2018-07-17 – 2018-07-18 (×2): 10 mL

## 2018-07-17 MED ORDER — SODIUM CHLORIDE 0.9 % IV SOLN
3.0000 g | Freq: Four times a day (QID) | INTRAVENOUS | Status: DC
  Administered 2018-07-17 – 2018-07-20 (×13): 3 g via INTRAVENOUS
  Filled 2018-07-17 (×3): qty 3
  Filled 2018-07-17: qty 8
  Filled 2018-07-17 (×4): qty 3
  Filled 2018-07-17: qty 8
  Filled 2018-07-17 (×3): qty 3
  Filled 2018-07-17 (×5): qty 8

## 2018-07-17 MED ORDER — PROPOFOL 1000 MG/100ML IV EMUL
5.0000 ug/kg/min | INTRAVENOUS | Status: DC
  Administered 2018-07-17: 10 ug/kg/min via INTRAVENOUS
  Administered 2018-07-18: 5 ug/kg/min via INTRAVENOUS
  Filled 2018-07-17: qty 100

## 2018-07-17 MED ORDER — SODIUM CHLORIDE 0.9 % IV SOLN
INTRAVENOUS | Status: AC
  Filled 2018-07-17: qty 20

## 2018-07-17 MED ORDER — BUDESONIDE 0.5 MG/2ML IN SUSP
0.5000 mg | Freq: Two times a day (BID) | RESPIRATORY_TRACT | Status: DC
  Administered 2018-07-17 – 2018-08-12 (×53): 0.5 mg via RESPIRATORY_TRACT
  Filled 2018-07-17 (×54): qty 2

## 2018-07-17 MED ORDER — SODIUM CHLORIDE 0.9% FLUSH: 10.0000 mL | INTRAVENOUS | Status: DC | PRN

## 2018-07-17 MED ORDER — LACTATED RINGERS IV SOLN
INTRAVENOUS | Status: DC
  Administered 2018-07-17: 1000 mL via INTRAVENOUS
  Administered 2018-07-18: 23:00:00 via INTRAVENOUS

## 2018-07-17 MED ORDER — MAGNESIUM SULFATE 2 GM/50ML IV SOLN
2.0000 g | Freq: Once | INTRAVENOUS | Status: AC
  Administered 2018-07-17: 2 g via INTRAVENOUS
  Filled 2018-07-17: qty 50

## 2018-07-17 MED ORDER — CHLORHEXIDINE GLUCONATE CLOTH 2 % EX PADS
6.0000 | MEDICATED_PAD | Freq: Every day | CUTANEOUS | Status: DC
  Administered 2018-07-17 – 2018-07-18 (×2): 6 via TOPICAL

## 2018-07-17 MED ORDER — FENTANYL CITRATE (PF) 100 MCG/2ML IJ SOLN
25.0000 ug | INTRAMUSCULAR | Status: DC | PRN
  Administered 2018-07-18 (×2): 50 ug via INTRAVENOUS
  Filled 2018-07-17 (×2): qty 2

## 2018-07-17 NOTE — Progress Notes (Signed)
CRITICAL VALUE ALERT  Critical Value:  Lactic acid  Date & Time Notified:  07/17/2018 @ 1415  Provider Notified: NP Moshe Cipro  Orders Received/Actions taken: No further orders at this time.

## 2018-07-17 NOTE — Progress Notes (Signed)
Initial Nutrition Assessment  DOCUMENTATION CODES:   Non-severe (moderate) malnutrition in context of chronic illness  INTERVENTION:   Tube feeding recommendations: - Vital AF 1.2 @ 50 ml/hr (1200 ml/day) via OG tube  Recommended tube feeding regimen provides 1440 kcal, 90 grams of protein, and 973 ml of H2O.   Recommended tube feeding regimen and current propofol provides 1495 total kcal.  NUTRITION DIAGNOSIS:   Moderate Malnutrition related to chronic illness (COPD) as evidenced by mild fat depletion, mild muscle depletion, moderate muscle depletion, percent weight loss (16.9% weight loss in 5 months).  GOAL:   Patient will meet greater than or equal to 90% of their needs  MONITOR:   Labs, I & O's, Weight trends, Vent status, Skin  REASON FOR ASSESSMENT:   Ventilator    ASSESSMENT:   68 year old female who presented to the ED on 7/07 with large volume hematemesis. Pt became unresponsive in PEA arrest and CPR initiated. ROSC was achieved. Pt required intubation in the ED. PMH of MI, factor V Leiden, COPD. TTM not pursued as pt was following commands post arrest.  Discussed pt with RN and during ICU rounds.  GI consulted. Plan is for EGD tomorrow, ideally before pt is extubated. RD will leave TF recommendations.  OG tube in place, currently to low intermittent suction with bright red bloody output.  Reviewed weight history in chart. Pt with progressive weight loss over the last 5 months. Overall, pt with a 13.7 kg weight loss since 02/21/18. This is a 16.9% weight loss which is significant for timeframe.  Patient is currently intubated on ventilator support MV: 8.1 L/min Temp (24hrs), Avg:98 F (36.7 C), Min:96.1 F (35.6 C), Max:99.7 F (37.6 C) BP: 125/60 MAP: 79  Propofol: 2.1 ml/hr (provides 55 kcal daily from lipid) LR: 50 ml/hr Protonix: 25 ml/hr  Medications reviewed and include: IV abx  Labs reviewed: magnesium 1.6 CBG's: 158  NUTRITION - FOCUSED  PHYSICAL EXAM:    Most Recent Value  Orbital Region  Mild depletion  Upper Arm Region  Mild depletion  Thoracic and Lumbar Region  Mild depletion  Buccal Region  Unable to assess  Temple Region  Mild depletion  Clavicle Bone Region  Moderate depletion  Clavicle and Acromion Bone Region  Moderate depletion  Scapular Bone Region  Unable to assess  Dorsal Hand  Unable to assess  Patellar Region  Mild depletion  Anterior Thigh Region  Mild depletion  Posterior Calf Region  Mild depletion  Edema (RD Assessment)  Mild [BLE]  Hair  Reviewed  Eyes  Unable to assess  Mouth  Unable to assess  Skin  Reviewed  Nails  Reviewed       Diet Order:   Diet Order            Diet NPO time specified  Diet effective now              EDUCATION NEEDS:   No education needs have been identified at this time  Skin:  Skin Assessment: Skin Integrity Issues: DTI: buttocks  Last BM:  no documented BM  Height:   Ht Readings from Last 1 Encounters:  07/16/18 5\' 4"  (1.626 m)    Weight:   Wt Readings from Last 1 Encounters:  07/17/18 67.5 kg    Ideal Body Weight:  54.5 kg  BMI:  Body mass index is 25.54 kg/m.  Estimated Nutritional Needs:   Kcal:  1466  Protein:  85-100 grams  Fluid:  >/= 1.5 L  Kate Jablonski Jayni Prescher, MS, RD, LDN Inpatient Clinical Dietitian Pager: [phone] Weekend/After Hours: [phone]  

## 2018-07-17 NOTE — ED Provider Notes (Signed)
[facility] Emergency Department Provider Note MRN:  [id number]  [place] date & time: 07/17/18     Chief Complaint   Respiratory Distress   History of Present Illness   [name] is a 68 y.o. year-old female with a history of MI, factor V Leiden, COPD presenting to the ED with chief complaint of hematemesis.  Large-volume hematemesis, concern for variceal bleeding according to EMS.  Becoming unresponsive and bradycardic per report.  I was unable to obtain an accurate HPI, PMH, or ROS due to the patient's altered mental status.  Review of Systems  Positive for hematemesis, altered mental status.  Patient's Health History    Past Medical History:  Diagnosis Date  . Acute MI ([place])    x4, code blue x3  . Arteriosclerotic cardiovascular disease (ASCVD) 2002   Inf STEMI-2002. 2003-cutting balloon + brachytherapy for restenosis; subsequent acute stent thrombosis 06/2010 requiring 2 separate interventions (Zeta stent, then repeat cath with thrombectomy). focal basal inf AK, nl EF; 03/2011: Patent stents, minor nonobst  residual dz, nl EF; neg stress nuclear in 2008 and stress echo in 2009  . Chronic anticoagulation    Warfarin plus ticagrelor  . Chronic respiratory failure ([place]) 08/27/2013   On 2L 02  . COPD (chronic obstructive pulmonary disease) ([place])    02 dependent  . COPD (chronic obstructive pulmonary disease) ([place])   . Factor 5 Leiden mutation, heterozygous ([place])   . Factor V Leiden, prothrombin gene mutation ([place]) 2006  . Hyperlipidemia   . Hypothyroidism   . Noncompliance   . Pelvic fracture ([place]) 2009  . Pulmonary embolism ([place]) 2006   Associated with deep vein thrombosis-2006; + factor V Leiden  . Tobacco abuse    50 pack years    Past Surgical History:  Procedure Laterality Date  . COLONOSCOPY  Approximately 2000   Negative screening study  . COLONOSCOPY WITH PROPOFOL N/A 05/28/2018   Procedure: COLONOSCOPY WITH PROPOFOL;  Surgeon: Rogene Houston, MD;  Location: [facility];  Service: Endoscopy;  Laterality: N/A;  . CORONARY ANGIOPLASTY  2002, 2003, 2012  . ESOPHAGOGASTRODUODENOSCOPY (EGD) WITH PROPOFOL N/A 05/26/2018   Procedure: ESOPHAGOGASTRODUODENOSCOPY (EGD) WITH PROPOFOL;  Surgeon: Rogene Houston, MD;  Location: [facility];  Service: Endoscopy;  Laterality: N/A;  . LEFT AND RIGHT HEART CATHETERIZATION WITH CORONARY ANGIOGRAM N/A 04/03/2011   Procedure: LEFT AND RIGHT HEART CATHETERIZATION WITH CORONARY ANGIOGRAM;  Surgeon: Sherren Mocha, MD;  Location: [facility] CATH LAB;  Service: Cardiovascular;  Laterality: N/A;  . POLYPECTOMY  05/28/2018   Procedure: POLYPECTOMY;  Surgeon: Rogene Houston, MD;  Location: [facility];  Service: Endoscopy;;  cold snare and biopsy forcep    Family History  Problem Relation Age of Onset  . Emphysema Mother   . Alzheimer's disease Mother   . Emphysema Sister   . Heart disease Father   . Factor V Leiden deficiency Father   . Factor V Leiden deficiency Sister   . Factor V Leiden deficiency Daughter   . Kidney cancer Paternal Grandmother     Social History   Socioeconomic History  . Marital status: Single    Spouse name: Not on file  . Number of children: Not on file  . Years of education: Not on file  . Highest education level: Not on file  Occupational History  . Occupation: Disabled  . 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 Financial resource strain: Patient refused  . Food insecurity    Worry:  Patient refused    Inability: Patient refused  . Transportation needs    Medical: Patient refused    Non-medical: Patient refused  Tobacco Use  . Smoking status: Current Some Day Smoker    Packs/day: 0.50    Years: 50.00    Pack years: 25.00    Types: Cigarettes    Start date: 05/10/1968  . Smokeless tobacco: Never Used  Substance and Sexual Activity  . Alcohol use: No    Alcohol/week: 0.0 standard drinks  . Drug use: No  . Sexual activity: Not on file  Lifestyle  .  Physical activity    Days per week: Patient refused    Minutes per session: Patient refused  . Stress: Not on file  Relationships  . Social Herbalist on phone: Patient refused    Gets together: Patient refused    Attends religious service: Patient refused    Active member of club or organization: Patient refused    Attends meetings of clubs or organizations: Patient refused    Relationship status: Patient refused  . Intimate partner violence    Fear of current or ex partner: Patient refused    Emotionally abused: Patient refused    Physically abused: Patient refused    Forced sexual activity: Patient refused  Other Topics Concern  . Not on file  Social History Narrative   Originally from [place]. Previously has lived in [place], [place], [place] & moved back to [place] in 1994. 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 Ronie Spies was in her current home. She also had an owl living in her house as a rehab pet. She also had chickens until Fall 2015. No mold exposure.      Physical Exam  Vital Signs and Nursing Notes reviewed Vitals:   07/16/18 2357 07/17/18 0000  BP: (!) 146/86 (!) 163/90  Pulse: 84 92  Resp: 16 (!) 22  SpO2: 100% 100%    CONSTITUTIONAL: Ill-appearing, airway with evident blood NEURO: Unresponsive, agonal he breathing EYES:  eyes equal and reactive ENT/NECK:  no LAD, no JVD CARDIO: Bradycardic rate, poorly perfused, pale PULM: Rhonchorous breath sounds GI/GU:   non-distended, non-tender MSK/SPINE:  No gross deformities, no edema SKIN:  no rash, atraumatic PSYCH: Unable to assess  Diagnostic and Interventional Summary    Labs Reviewed  CBC - Abnormal; Notable for the following components:      Result Value   RBC 3.12 (*)    Hemoglobin 9.0 (*)    HCT 32.3 (*)    MCV 103.5 (*)    MCHC 27.9 (*)    RDW 16.1 (*)    All other components within normal limits  PROTIME-INR - Abnormal; Notable  for the following components:   Prothrombin Time 16.9 (*)    INR 1.4 (*)    All other components within normal limits  APTT - Abnormal; Notable for the following components:   aPTT 43 (*)    All other components within normal limits  SARS CORONAVIRUS 2 (HOSPITAL ORDER, [place] [facility])  COMPREHENSIVE METABOLIC PANEL  LIPASE, BLOOD  LACTIC ACID, PLASMA  BLOOD GAS, VENOUS  BLOOD GAS, ARTERIAL  TYPE AND SCREEN    DG Chest Port 1 View    (Results Pending)    Medications  cefTRIAXone (ROCEPHIN) 2 g in sodium chloride 0.9 % 100 mL IVPB (2 g Intravenous New Bag/Given 07/16/18 2342)  pantoprazole (PROTONIX) 80 mg in sodium chloride 0.9 % 100 mL IVPB (80 mg Intravenous New Bag/Given 07/17/18 0000)  pantoprazole (PROTONIX) 80 mg in sodium chloride 0.9 % 250 mL (0.32 mg/mL) infusion (has no administration in time range)  octreotide (SANDOSTATIN) 2 mcg/mL load via infusion 50 mcg (50 mcg Intravenous Bolus from Bag 07/17/18 0006)    And  octreotide (SANDOSTATIN) 500 mcg in sodium chloride 0.9 % 250 mL (2 mcg/mL) infusion (50 mcg/hr Intravenous Rate/Dose Change 07/17/18 0007)  propofol (DIPRIVAN) 1000 MG/100ML infusion (has no administration in time range)  prothrombin complex conc human (KCENTRA) IVPB 1,500 Units (has no administration in time range)  phytonadione (VITAMIN K) 10 mg in dextrose 5 % 50 mL IVPB (has no administration in time range)  EPINEPHrine (ADRENALIN) 1 MG/10ML injection (1 mg Intravenous Given 07/16/18 2319)  sodium bicarbonate injection (100 mEq Intravenous Given 07/16/18 2319)  sodium chloride 0.9 % bolus 1,000 mL (1,000 mLs Intravenous New Bag/Given 07/16/18 2346)  pantoprazole (PROTONIX) 40 MG injection (has no administration in time range)  octreotide (SANDOSTATIN) 500 MCG/ML injection (has no administration in time range)  phytonadione (VITAMIN K) 10 MG/ML SQ injection (has no administration in time range)  sodium chloride 0.9 % with cefTRIAXone (ROCEPHIN) ADS  Med (has no administration in time range)     Procedure Name: Intubation Date/Time: 07/17/2018 12:13 AM Performed by: Maudie Flakes, MD Pre-anesthesia Checklist: Patient identified Oxygen Delivery Method: Non-rebreather mask Preoxygenation: Pre-oxygenation with 100% oxygen Induction Type: Rapid sequence Laryngoscope Size: Glidescope and 3 Grade View: Grade I Tube size: 7.5 mm Number of attempts: 1 Airway Equipment and Method: Rigid stylet Placement Confirmation: ETT inserted through vocal cords under direct vision and Positive ETCO2 Secured at: 22 cm Difficulty Due To: Difficulty was anticipated Comments: Bloody airway, difficult visualization requiring significant suction.  Clear view of the cords after suction.    Ultrasound ED Echo  Date/Time: 07/17/2018 12:16 AM Performed by: Maudie Flakes, MD Authorized by: Maudie Flakes, MD   Procedure details:    Indications: cardiac arrest     Views: apical 4 chamber view     Images: not archived   Findings:    Pericardium: no pericardial effusion     Cardiac Activity comment:  Reduced ejection fraction, estimated 40% Impression:    Impression: decreased contractility     Intraosseous line insertion Performed by: Maudie Flakes  Consent: Emergent procedure Risks and benefits: risks, benefits and alternatives were discussed Immediately prior to procedure the correct patient, procedure, equipment, support staff and site/side marked as needed.  Indication: difficult IV access Preparation: Patient was prepped and draped in the usual sterile fashion. Location: Right proximal tibia Normal blood return.  Patient tolerance: Patient tolerated the procedure well with no immediate complications.  Procedure: CPR CPR was administered to the patient for an estimated 12 minutes.  I facilitated the running of this code and coached proper CPR technique.  Critical Care Critical Care Documentation Critical care time provided by me  (excluding procedures): 62 minutes  Condition necessitating critical care: Severe GI bleed, cardiac arrest, management of return of spontaneous circulation  Components of critical care management: reviewing of prior records, laboratory and imaging interpretation, frequent re-examination and reassessment of vital signs, administration of IV epinephrine, IV bicarbonate, packed red blood cells (emergent release), octreotide, ceftriaxone, Protonix, discussion with consulting services.    ED Course and Medical Decision Making  I have reviewed the triage vital signs and the nursing notes.  Pertinent labs & imaging  results that were available during my care of the patient were reviewed by me and considered in my medical decision making (see below for details).  Severe hematemesis initially thought to be related to variceal bleeding according to EMS report in this 68 year old female.  Patient quickly became unresponsive during her first few minutes in the resuscitation room, no pulses, CPR was initiated.  Patient received a total of about 10 to 12 minutes of CPR, 3 mg of epinephrine, 2 amps of bicarbonate.  During this downtime she was in PEA arrest.  During this time an IO was established and she received 2 units of packed red blood cells with 1 L of crystalloid.  Rosc was achieved.  Patient receiving an additional liter of crystalloid, 1/3 unit of packed red blood cells, 2 g ceftriaxone, octreotide, Protonix drip.  Upon further review, patient does not have a history of cirrhosis documented, she is chronically anticoagulated on warfarin due to factor V Leiden disease.  Discussed case with intensivist, who does recommend transport to [facility] for further care.  Dr. Loanne Drilling is the accepting physician.  Patient is showing signs of continued bleeding and hematemesis, currently mixing Kcentra in the hopes to provide this to the patient prior to transport.  However her vital signs after return of spontaneous  circulations have been reassuring, heart rates in the 80s, blood pressure 140s to 160s, warm and perfused.  ET tube secured, triple-lumen right femoral.  Barth Kirks. [place] Small, [place] [email or web address]  Final Clinical Impressions(s) / ED Diagnoses     ICD-10-CM   1. Gastrointestinal hemorrhage, unspecified gastrointestinal hemorrhage type  K92.2   2. Encounter for intubation  Z01.818 DG Chest [facility] 1 View    DG Chest [place] 1 View  3. Hematemesis, presence of nausea not specified  K92.0   4. Hypovolemic shock (HCC)  R57.1   5. Cardiac arrest (HCC)  I46.9   6. Anticoagulated  Z79.01     ED Discharge Orders    None         Maudie Flakes, MD 07/17/18 [phone]

## 2018-07-17 NOTE — Consult Note (Addendum)
[facility] Consult: 12:11 PM 07/17/2018  LOS: 0 days    Referring Provider: Dr Vaughan Browner of [facility].    Primary Care Physician:  [name], NP Primary Gastroenterologist:  Dr. Laural Golden.   Santogade MD and Zehr PA in past.     Reason for Consultation:  Hematemesis.     HPI: [name] is a 68 y.o. female.  PMH Factor V Leiden d/o on chronic Coumadin and Brilinta.  PE 2006.  CAD.  STEMI 2002.  Previous biliary stents, latest was in 2012.  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  Hypothyroidism.  Hyperlipidemia.    During 5/14 - 05/29/18 admission, underwent GI evaluation for Hgb 6.6 >> 8.2 after 2 PRBCs.  Melenic stools.  70 # weight loss over the last few years. 05/26/2018 EGD.  Dr. Laural Golden.  Study within normal limits to 2nd duodenum. 05/28/2018 colonoscopy.  Small, superficial anal ulcer noted.  7 mm cecal polyp resected, polypectomy site clipped.  Biopsy of one small polyp at the hepatic flexure.  Sigmoid diverticulosis.  External hemorrhoids.   ?  Diverticular bleed or source in small bowel? Dr Laural Golden contemplated outpt capsule endoscopy.  Pathology: TAs with no HGD.  Dr. Laural Golden recommended colonoscopy in 5 years.  Pt was no show for the 06/20/2018 GI follow-up in [place].  6/25 -07/11/2018 admission to [facility] for acute on chronic hypoxic respiratory failure.  Prior to that admission hematemesis reported by pt friend, none during course of admission.  Given recent EGD and colonoscopy, GI was not consulted.   CTAP 07/05/2018 with new LLL opacity, 3cm infrarenal aortic aneurysm. She was treated with steroids but none at discharge.  INR subtherapeutic at 1.4, so she received a prescription for Lovenox at d/c to tide her over until her INR became therapeutic.  Cardiology did not recommend further  recommendations for elevated troponin.  Received 7 d abx for LE cellulitis.  Also on discharge med list was iron sulfate 325 mg/day, Protonix 40 mg/day.    Transfer overnight from [facility] where she presented with history hematemesis.  She was transported by EMS to the [facility], ED.  It was reported she had bloody emesis at her residence.  She was in respiratory distress, ashen/gray coloring at arrival.  Suffered cardiac arrest with PEA.  Coded at the [facility], ED for 12 minutes Hgb 8 >> 11.5.  MCV in the 90s. WBCs 6.6 >> 26.   INR 1.2.   No AKI. Lactic acid 4.8 >> 2.2.  TSH 12.5.     Remains intubated on vent.  Currently on propofol, Protonix drips.  No pressors.  Blood pressure, heart rhythm stable.  There is about 50 cc of blood in the NG canister.  Canister was emptied around 7 AM today at which point about 2 to 300 cc of blood was present.  Any dark stools, bloody stools, melena or further emesis.    Patient is an active smoker.  Does not consume alcoholic beverages.  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  2 adult children, a daughter in [place] and [place] and a son in [place].  Past Medical History:  Diagnosis Date   Acute MI ([place])    x4, code blue x3   Arteriosclerotic cardiovascular disease (ASCVD) 2002   Inf STEMI-2002. 2003-cutting balloon + brachytherapy for restenosis; subsequent acute stent thrombosis 06/2010 requiring 2 separate interventions (Zeta stent, then repeat cath with thrombectomy). focal basal inf AK, nl EF; 03/2011: Patent stents, minor nonobst  residual dz, nl EF; neg stress nuclear in 2008 and stress echo in 2009   Chronic anticoagulation    Warfarin plus ticagrelor   Chronic respiratory failure ([place]) 08/27/2013   On 2L 02   COPD (chronic obstructive pulmonary disease) ([place])    02 dependent   COPD (chronic obstructive pulmonary disease) ([place])    Factor 5 Leiden mutation, heterozygous  ([place])    Factor V Leiden, prothrombin gene mutation ([place]) 2006   Hyperlipidemia    Hypothyroidism    Noncompliance    Pelvic fracture ([place]) 2009   Pulmonary embolism ([place]) 2006   Associated with deep vein thrombosis-2006; + factor V Leiden   Tobacco abuse    50 pack years    Past Surgical History:  Procedure Laterality Date   COLONOSCOPY  Approximately 2000   Negative screening study   COLONOSCOPY WITH PROPOFOL N/A 05/28/2018   Procedure: COLONOSCOPY WITH PROPOFOL;  Surgeon: Rogene Houston, MD;  Location: [facility] ENDO SUITE;  Service: Endoscopy;  Laterality: N/A;   CORONARY ANGIOPLASTY  2002, 2003, 2012   ESOPHAGOGASTRODUODENOSCOPY (EGD) WITH PROPOFOL N/A 05/26/2018   Procedure: ESOPHAGOGASTRODUODENOSCOPY (EGD) WITH PROPOFOL;  Surgeon: Rogene Houston, MD;  Location: [facility] ENDO SUITE;  Service: Endoscopy;  Laterality: N/A;   LEFT AND RIGHT HEART CATHETERIZATION WITH CORONARY ANGIOGRAM N/A 04/03/2011   Procedure: LEFT AND RIGHT HEART CATHETERIZATION WITH CORONARY ANGIOGRAM;  Surgeon: Sherren Mocha, MD;  Location[facility] CATH LAB;  Service: Cardiovascular;  Laterality: N/A;   POLYPECTOMY  05/28/2018   Procedure: POLYPECTOMY;  Surgeon: Rogene Houston, MD;  Location: [facility] ENDO SUITE;  Service: Endoscopy;;  cold snare and biopsy forcep    Prior to Admission medications   Medication Sig Start Date End Date Taking? Authorizing Provider  albuterol (PROVENTIL) (2.5 MG/3ML) 0.083% nebulizer solution USE 1 VIAL BY NEBULIZER EVERY 4 HOURS AS NEEDED FOR WHEEZING. DX: J44.9 Patient taking differently: Take 2.5 mg by nebulization every 4 (four) hours as needed for wheezing or shortness of breath.  01/24/17   Magdalen Spatz, NP  arformoterol (BROVANA) 15 MCG/2ML NEBU Take 2 mLs (15 mcg total) by nebulization 2 (two) times daily. 09/07/17   Juanito Doom, MD  baclofen (LIORESAL) 10 MG tablet Take 1 tablet (10 mg total) by mouth 3 (three) times daily. 01/24/18   Lavina Hamman, MD  bisacodyl  (DULCOLAX) 5 MG EC tablet Take 1 tablet (5 mg total) by mouth daily as needed for mild constipation. 05/29/18   Manuella Ghazi, Pratik D, DO  bisoprolol (ZEBETA) 5 MG tablet Take 0.5 tablets (2.5 mg total) by mouth daily. 07/12/18 08/11/18  Manuella Ghazi, Pratik D, DO  budesonide (PULMICORT) 0.5 MG/2ML nebulizer solution Take 2 mLs (0.5 mg total) by nebulization 2 (two) times daily. Dx: J43.9 01/24/17   Magdalen Spatz, NP  buPROPion (WELLBUTRIN SR) 100 MG 12 hr tablet Take 100 mg by mouth every morning.    [provider]  citalopram (CELEXA) 10 MG tablet Take 1 tablet by mouth daily. 04/29/18   [provider]  enoxaparin (LOVENOX) 120 MG/0.8ML injection Inject 0.73 mLs (110 mg total) into the skin daily for 10 days. 07/11/18 07/21/18  Manuella Ghazi, Pratik D, DO  ferrous sulfate 325 (65 FE) MG tablet Take 1 tablet (325 mg total) by mouth daily with breakfast. 07/11/18 08/10/18  Manuella Ghazi, Pratik D, DO  Glycerin-Hypromellose-PEG 400 (DRY EYE RELIEF DROPS) 0.2-0.2-1 % SOLN Place 1-2 drops into both eyes 2 (two) times a day.    [provider]  HYDROcodone-acetaminophen (NORCO/VICODIN) 5-325 MG tablet Take 1 tablet by mouth every 6 (six) hours as needed (BREAKTHROUGH PAIN). 02/19/18   Johnson, Clanford L, MD  ipratropium-albuterol (DUONEB) 0.5-2.5 (3) MG/3ML SOLN Take 3 mLs by nebulization 4 (four) times daily. 03/20/17   Magdalen Spatz, NP  levothyroxine (SYNTHROID, LEVOTHROID) 125 MCG tablet Take 1 tablet (125 mcg total) by mouth daily. 08/16/17   Nafziger, Tommi Rumps, NP  nitroGLYCERIN (NITROSTAT) 0.4 MG SL tablet Place 1 tablet (0.4 mg total) under the tongue every 5 (five) minutes as needed for chest pain. 10/28/14   Herminio Commons, MD  Omega-3 Fatty Acids (FISH OIL) 600 MG CAPS Take 1 capsule by mouth daily.     [provider]  pantoprazole (PROTONIX) 40 MG tablet Take 1 tablet (40 mg total) by mouth daily. 02/20/18   Murlean Iba, MD  Respiratory Therapy Supplies (FLUTTER) DEVI Use as directed 05/14/15    Javier Glazier, MD  rosuvastatin (CRESTOR) 5 MG tablet Take 1 tablet (5 mg total) by mouth daily at 6 PM. 07/11/18 08/10/18  Manuella Ghazi, Pratik D, DO  ticagrelor (BRILINTA) 60 MG TABS tablet Take 1 tablet (60 mg total) by mouth 2 (two) times daily. 02/01/18   Johnson, Clanford L, MD  traZODone (DESYREL) 50 MG tablet Take 50 mg by mouth at bedtime as needed for sleep.    [provider]  warfarin (COUMADIN) 1 MG tablet Take 5-6 mg by mouth See admin instructions. On Sunday, Tuesday, Thursday, and Saturday, patient takes 54m; on all other days, patient takes 518m   [provider]    Scheduled Meds:  arformoterol  15 mcg Nebulization BID   budesonide (PULMICORT) nebulizer solution  0.5 mg Nebulization BID   chlorhexidine gluconate (MEDLINE KIT)  15 mL Mouth Rinse BID   Chlorhexidine Gluconate Cloth  6 each Topical Daily   levothyroxine  62.5 mcg Intravenous Daily   mouth rinse  15 mL Mouth Rinse 10 times per day   sodium chloride flush  10-40 mL Intracatheter Q12H   Infusions:  ampicillin-sulbactam (UNASYN) IV 3 g (07/17/18 1109)   lactated ringers 50 mL/hr at 07/17/18 1000   pantoprozole (PROTONIX) infusion 8 mg/hr (07/17/18 1000)   propofol (DIPRIVAN) infusion 5 mcg/kg/min (07/17/18 1000)   PRN Meds: fentaNYL (SUBLIMAZE) injection, fentaNYL (SUBLIMAZE) injection, sodium chloride flush   Allergies as of 07/16/2018 - Review Complete 07/16/2018  Allergen Reaction Noted   Dilaudid [hydromorphone hcl] Hives and Nausea Only 08/24/2013   Minocycline hcl     Prednisone     Varenicline tartrate     Zocor [simvastatin - high dose] Other (See Comments) 12/06/2010    Family History  Problem Relation Age of Onset   Emphysema Mother    Alzheimer's disease Mother    Emphysema Sister    Heart disease Father    Factor V Leiden deficiency Father    Factor V Leiden deficiency Sister    Factor V Leiden deficiency Daughter    Kidney cancer Paternal  Grandmother  Social History   Socioeconomic History   Marital status: Single    Spouse name: Not on file   Number of children: Not on file   Years of education: Not on file   Highest education level: Not on file  Occupational History   Occupation: Disabled   Occupation: Truck Solicitor strain: Patient refused   Food insecurity    Worry: Patient refused    Inability: Patient refused   Transportation needs    Medical: Patient refused    Non-medical: Patient refused  Tobacco Use   Smoking status: Current Some Day Smoker    Packs/day: 0.50    Years: 50.00    Pack years: 25.00    Types: Cigarettes    Start date: 05/10/1968   Smokeless tobacco: Never Used  Substance and Sexual Activity   Alcohol use: No    Alcohol/week: 0.0 standard drinks   Drug use: No   Sexual activity: Not on file  Lifestyle   Physical activity    Days per week: Patient refused    Minutes per session: Patient refused   Stress: Not on file  Relationships   Social connections    Talks on phone: Patient refused    Gets together: Patient refused    Attends religious service: Patient refused    Active member of club or organization: Patient refused    Attends meetings of clubs or organizations: Patient refused    Relationship status: Patient refused   Intimate partner violence    Fear of current or ex partner: Patient refused    Emotionally abused: Patient refused    Physically abused: Patient refused    Forced sexual activity: Patient refused  Other Topics Concern   Not on file  Social History Narrative   Originally from [place]. Previously has lived in [place], [place], [place] & moved back to [place] in 1994. 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 Ronie Spies was in her current home. She also had an owl living in her house as a rehab pet. She also had chickens until Fall 2015. No mold exposure.      REVIEW OF SYSTEMS: Unable to obtain a review of system in this intubated, sedated patient   PHYSICAL EXAM: Vital signs in last 24 hours: Vitals:   07/17/18 1128 07/17/18 1129  BP:  140/74  Pulse:  67  Resp:    Temp: 99.7 F (37.6 C)   SpO2:     Wt Readings from Last 3 Encounters:  07/17/18 67.5 kg  07/07/18 70.1 kg  05/29/18 70.1 kg    General: Poor coloring, ashen.  Intubated, unresponsive. Head: No signs of head trauma.  No facial asymmetry. Eyes: No scleral icterus.  No conjunctival pallor. Ears: Unable to assess hearing Nose: No discharge or congestion. Mouth: OG tube in place.  Around 50 cc of red blood in the suction canister. Neck: No JVD. Lungs: Non-labored breathing on the vent.  Clear bilaterally. Heart: RRR.  NSR on telemetry monitor.  No MRG. Abdomen: Soft.  Bowel sounds present but hypoactive.  Not tender or distended.  No masses, no HSM, no bruits, no hernias..   Rectal: Not performed Musc/Skeltl: No obvious joint redness, swelling or gross deformities. Extremities: Feet are warm with brisk reperfusion in the toes. Neurologic: Unresponsive, sedated. Skin: No obvious rashes or sores or large bruises on incomplete dermatologic survey. Tattoos: None observed  Intake/Output from previous day: 07/07 0701 - 07/08 0700 In: 1726.5 [I.V.:177.6; IV Piggyback:1548.9] Out: 350 [Urine:350] Intake/Output this shift: Total I/O In: 231.9 [I.V.:230.9; IV Piggyback:1.1] Out: -   LAB RESULTS: Recent Labs    07/16/18 2309 07/17/18 0353  WBC 9.7 26.0*  HGB 9.0* 11.5*  HCT 32.3* 36.4  PLT 399 228   BMET Lab Results  Component Value Date   NA 142 07/17/2018   NA 141 07/16/2018   NA 141 07/10/2018   K 4.2 07/17/2018   K 4.2 07/16/2018   K 4.5 07/10/2018   CL 102 07/17/2018   CL 97 (L) 07/16/2018   CL 102 07/10/2018   CO2 33 (H) 07/17/2018   CO2 37 (H) 07/16/2018   CO2 33 (H) 07/10/2018   GLUCOSE 181 (H) 07/17/2018   GLUCOSE 200 (H) 07/16/2018    GLUCOSE 125 (H) 07/10/2018   BUN 16 07/17/2018   BUN 15 07/16/2018   BUN 19 07/10/2018   CREATININE 0.88 07/17/2018   CREATININE 0.86 07/16/2018   CREATININE 0.61 07/10/2018   CALCIUM 7.1 (L) 07/17/2018   CALCIUM 8.0 (L) 07/16/2018   CALCIUM 8.1 (L) 07/10/2018   LFT Recent Labs    07/16/18 2309  PROT 6.4*  ALBUMIN 3.1*  AST 15  ALT 15  ALKPHOS 58  BILITOT 0.3   PT/INR Lab Results  Component Value Date   INR 1.2 07/17/2018   INR 1.4 (H) 07/16/2018   INR 1.4 (H) 07/11/2018   Hepatitis Panel No results for input(s): HEPBSAG, HCVAB, HEPAIGM, HEPBIGM in the last 72 hours. C-Diff No components found for: CDIFF Lipase     Component Value Date/Time   LIPASE 31 07/16/2018 2309    Drugs of Abuse     Component Value Date/Time   LABOPIA POSITIVE (A) 05/23/2018 1049   COCAINSCRNUR NONE DETECTED 05/23/2018 1049   LABBENZ NONE DETECTED 05/23/2018 1049   AMPHETMU NONE DETECTED 05/23/2018 1049   THCU NONE DETECTED 05/23/2018 1049   LABBARB NONE DETECTED 05/23/2018 1049     RADIOLOGY STUDIES: Dg Chest Port 1 View  Result Date: 07/17/2018 CLINICAL DATA:  Post intubation. EXAM: PORTABLE CHEST 1 VIEW COMPARISON:  July 04, 2018 FINDINGS: The endotracheal tube terminates above the carina by approximately 4.8 cm. The enteric tube appears to extend below the left hemidiaphragm. The heart size is enlarged. There is a dense retrocardiac opacity. There are worsening interstitial lung markings bilaterally. There is no pneumothorax. IMPRESSION: 1. Lines and tubes as above. 2. Interval worsening of a dense retrocardiac opacity as seen on prior studies. 3. Worsening interstitial lung markings bilaterally. 4. Stable cardiac enlargement. Electronically Signed   By: Constance Holster M.D.   On: 07/17/2018 00:08      IMPRESSION:   *    Hematemesis.  ? MWT, ? Dielafoy's ulcer given bland EGD findings 5 weeks ago.   05/2018 EGD and colonoscopy for evaluation of melena and anemia.  The upper  endoscopy was normal.  Had adenomatous polyps, diverticulosis, hemorrhoids, non-bleeding small anal ulcer on colonoscopy.  *    PEA arrest, coded for 12 minutes.  Remains on vent ? if initiating event was aspiration?  *   Chronic Coumadin and Brilinta.  For factor V Leiden and drug-eluting coronary stents. Subtherapeutic INR.    PLAN:     *   EGD this admission   Azucena Freed  07/17/2018, 12:11 PM Phone [phone]    ________________________________________________________________________  Velora Heckler GI MD note:  I personally examined the patient,  reviewed the data and agree with the assessment and plan described above.  Recurrent GI bleeding on two blood thinners (coumadin and brilinta). EGD and colonoscopy two months ago at [facility] were essentially unrevealing.  Now with hematemesis and then cardiac arrest at outside hospital.  She will need repeat upper endoscopy this admission. Timing depends on clinical course, recovery from her cardia arrest/code.  For now keep her on IV PPI infusion. She has not required blood transfusion since this all started yesterday, in fact her Hb is higher now than when she was discharged from [facility] a week ago.   Owens Loffler, MD [facility] Pager [phone]

## 2018-07-17 NOTE — ED Notes (Signed)
Date and time results received: 07/17/18 0129 (use smartphrase ".now" to insert current time)  Test: lactic acid Critical Value: 4.8  Name of Provider Notified: Dr. Janann Colonel notified by Pamala Hurry RN with [facility] at 0200  Orders Received? Or Actions Taken?: no/na

## 2018-07-17 NOTE — Progress Notes (Signed)
Notified of increasing lactic 2.2-> 2.7 Patient is not on vasopressors, UOP ok.    Blood pressure 132/61, pulse 62, temperature 99.7 F (37.6 C), temperature source Oral, resp. rate 18, height 5\' 4"  (1.626 m), weight 67.5 kg, SpO2 96 %.  P:  Repeat H/H now  Monitor UOP Trend lactate   Kennieth Rad, MSN, AGACNP-BC  Pulmonary & Critical Care Pgr: [phone] or if no answer [phone] 07/17/2018, 3:22 PM

## 2018-07-17 NOTE — ED Notes (Signed)
Dr. Janann Colonel notified of pt's lactic acid of 4.8, no new orders at this time

## 2018-07-17 NOTE — ED Notes (Signed)
Pt arrived at 2300 via [facility] for c/o vomiting blood. When ems arrived pt was found to be lethargic with O2 sats in the 50-60's; NRB applied and pt sats increased to 90%; When patient arrived pt was moved over to the ED bed and pulses were checked; pt was found to have no central pulses and CPR was started;  Pt given 2 rounds of epinephrine and 2 amps of bicarb; pt was also given 3 emergency units of blood; pt was intubated with a 7.5CM ETT; pt then had ROSC; 42fr NG tube was inserted with 166ml of dark red fluid output in suction canister; pt then vomited large amount of dark brown emesis with clots;

## 2018-07-17 NOTE — Progress Notes (Signed)
Pharmacy Antibiotic Note  [name] is a 68 y.o. female admitted on 07/16/2018 with PEA arrest now concerned with aspiration pneumonia.  Pharmacy has been consulted for Unasyn dosing. No cultures, CrCl 57.8 mL/min.  Plan: Unasyn 3g Q6H Trend WBC, Temp, renal fxn  Height: 5\' 4"  (162.6 cm) Weight: 148 lb 13 oz (67.5 kg) IBW/kg (Calculated) : 54.7  Temp (24hrs), Avg:97.4 F (36.3 C), Min:96.1 F (35.6 C), Max:98.6 F (37 C)  Recent Labs  Lab 07/11/18 0510 07/16/18 2309 07/17/18 0050 07/17/18 0353 07/17/18 0359  WBC 8.1 9.7  --  26.0*  --   CREATININE  --  0.86  --  0.88  --   LATICACIDVEN  --   --  4.8*  --  1.3    Estimated Creatinine Clearance: 57.8 mL/min (by C-G formula based on SCr of 0.88 mg/dL).    Allergies  Allergen Reactions  . Dilaudid [Hydromorphone Hcl] Hives and Nausea Only  . Minocycline Hcl     REACTION: Dizzy  . Prednisone     REACTION: feels like throat swelling, hallucinations  . Varenicline Tartrate     REACTION: Dizzy(chantix)   . Zocor [Simvastatin - High Dose] Other (See Comments)    myalgia    Antimicrobials this admission: Unasyn 7/8 >>   Microbiology results: Sent  Thank you for allowing pharmacy to be a part of this patient's care.  Twanna Resh L. Devin Going, PharmD, [place] PGY1 Pharmacy Resident [employer]: [phone] 07/17/18 8:35 AM Please check AMION for all [facility] phone numbers After 10:00 PM, call the [facility] [phone]

## 2018-07-17 NOTE — Plan of Care (Signed)
  Problem: Clinical Measurements: Goal: Will remain free from infection Outcome: Progressing   Problem: Clinical Measurements: Goal: Diagnostic test results will improve Outcome: Progressing   Problem: Clinical Measurements: Goal: Respiratory complications will improve Outcome: Progressing   Problem: Clinical Measurements: Goal: Cardiovascular complication will be avoided Outcome: Progressing   

## 2018-07-17 NOTE — Progress Notes (Signed)
PCCM INTERVAL PROGRESS NOTE  Am labs reviewed  WBC up > with concern for aspiration will start unasyn.    Hgb 11 reassuring  Mag 1.6. Give 2G mag    Georgann Housekeeper, AGACNP-BC [place] Pager [phone] or [phone]  07/17/2018 5:32 AM

## 2018-07-17 NOTE — ED Notes (Signed)
Date and time results received: 07/17/18 0014  Test: pCO2 Critical Value: 81.3  Name of Provider Notified: EDP, Roxanne Mins  Orders Received? Or Actions Taken?: adjust vent settings, relayed to RT at bedside

## 2018-07-17 NOTE — ED Notes (Signed)
Report given to [facility] with [employer] 

## 2018-07-17 NOTE — ED Notes (Signed)
Pt's belongings placed in a bag and given to [facility] staff

## 2018-07-17 NOTE — Progress Notes (Signed)
eLink Physician-Brief Progress Note Patient Name: Carlean Crowl DOB: 06/04/1950 MRN: [id number]   Date of Service  07/17/2018  HPI/Events of Note  Called for "orders" as ground team has not seen the patient yet.   eICU Interventions  Will order: 1. Mechanical ventilation - 100%/PRVC 18/TV 430/P 5. 2. CBC with platelets, BMP, Mg++, PT/INR, PTT and ABG now.  3. Lactic Acid level now and Q 3 hours X 3.         Tyrease Vandeberg Cornelia Copa 07/17/2018, 3:55 AM

## 2018-07-17 NOTE — H&P (Addendum)
NAME:  [name], MRN:  [id number], DOB:  [birth year], LOS: 0 ADMISSION DATE:  07/16/2018, CONSULTATION DATE:  7/8 REFERRING MD: Dr. Sedonia Small EDP, CHIEF COMPLAINT:  Hematemesis    Brief History   68 year old with Factor V Leiden on warfarin presented with hematemesis. Coded in [facility] ED for 12 minutes.   History of present illness   68 year old female with PMH as below, which is significant for Factor V Leiden on warfarin, COPD, MI, HTN, and PE.  She had large volume hematemesis in the late PM hours of 7/7 and EMS was called. First few minutes in the ED she was hypoxemic to 60s on room air, became unresponsive, and suffered cardiac arrest. ROSC after about 10-12 minutes. SHe was PEA arrest. Was given epi, bicarb, blood (3 units), and IVF during code. PCCs were given post code in hopes to reverse coagulopathy. CVL and ETT placed in ED. Started on protonix and octreotide. No history of cirrhosis. Transferred to [facility] ICU.  Past Medical History   has a past medical history of Acute MI ([place]), Arteriosclerotic cardiovascular disease (ASCVD) (2002), Chronic anticoagulation, Chronic respiratory failure ([place]) (08/27/2013), COPD (chronic obstructive pulmonary disease) ([place]), COPD (chronic obstructive pulmonary disease) ([place]), Factor 5 Leiden mutation, heterozygous ([place]), Factor V Leiden, prothrombin gene mutation ([place]) (2006), Hyperlipidemia, Hypothyroidism, Noncompliance, Pelvic fracture ([place]) (2009), Pulmonary embolism ([place]) (2006), and Tobacco abuse.   Significant Hospital Events   7/8 present with hematemesis, cardiac arrest 12 mins PEA.   Consults:    Procedures:  ETT 7/8 > CVL 7/8 >  Significant Diagnostic Tests:  Endoscopy 05/28/18 > Normal esophagus, normal stomach  Micro Data:  Blood 7/8 > Sputum 7/8 >  Antimicrobials:    Interim history/subjective:    Objective   Blood pressure (!) 142/71, pulse 76, temperature (!) 97.5 F (36.4 C), temperature source Oral, resp.  rate 18, height 5\' 4"  (1.626 m), weight 70.1 kg, SpO2 97 %.    Vent Mode: PRVC FiO2 (%):  [60 %-100 %] 100 % Set Rate:  [16 bmp-18 bmp] 18 bmp Vt Set:  [430 mL-550 mL] 430 mL PEEP:  [5 cmH20] 5 cmH20 Plateau Pressure:  [34 cmH20-36 cmH20] 36 cmH20   Intake/Output Summary (Last 24 hours) at 07/17/2018 0403 Last data filed at 07/17/2018 0201 Gross per 24 hour  Intake 1400 ml  Output -  Net 1400 ml   Filed Weights   07/16/18 2326  Weight: 70.1 kg    Examination: General: Elderly female in NAD on vent HENT: [place]/AT, PERRL, no JVD Lungs: Diminished L base Cardiovascular: RRR, no MRG Abdomen: Soft, non-tender, non-distended Extremities: Soft, non-tender, non-distended Neuro: Lethargic. Awake to voice. Wiggles toes to command.    Resolved Hospital Problem list     Assessment & Plan:   Cardiac arrest: PEA. 12 mins duration. Likely in the setting of hemorrhagic shock vs profound hypoxia.  - Not a candidate for TTM as she is following commands at this point  Hematemesis: no known history of liver disease or alcohol abuse. REcent endoscopy with no signs of bleeding. She is on warfarin for Factor V Leiden. PCCs and vitamin K given in ED.  - Follow H&H every 6 hours - Trend coags - continue PPI infusion. DC octreotide - Transfuse for Hgb < 8 - Holding anticoagulation - GI consult in AM  Acute on chronic hypoxemic respiratory failure: suspect she may have aspirated. Retrocardiac density on CXR. On 2L home O2.  Acute on Chronic hypercarbic  respiratory failure . COPD without acute exacerbation. Patient of Dr. [place] Bells.  - Full vent support - ABG - CXR - VAP bundle - Send sputum cx - Hold off on ABX for now.  - Continue home budesonide and brovana nebs  CAD - holding Brilinta   Factor V Leiden - holding warfarin  Hypothyroid - check TSH  Best practice:  Diet: NPO Pain/Anxiety/Delirium protocol (if indicated): Propofol for RASS goal -1 to -2.  VAP protocol (if indicated):  Yes DVT prophylaxis: SCDs GI prophylaxis: PPI gtt Glucose control: NA Mobility: BR Code Status: FULL Family Communication:  Called daughter. No answer.  Disposition: ICU  Labs   CBC: Recent Labs  Lab 07/10/18 0434 07/11/18 0510 07/16/18 2309  WBC 6.5 8.1 9.7  HGB 8.8* 8.8* 9.0*  HCT 30.1* 29.3* 32.3*  MCV 98.0 95.4 103.5*  PLT 165 194 330    Basic Metabolic Panel: Recent Labs  Lab 07/10/18 0434 07/16/18 2309  NA 141 141  K 4.5 4.2  CL 102 97*  CO2 33* 37*  GLUCOSE 125* 200*  BUN 19 15  CREATININE 0.61 0.86  CALCIUM 8.1* 8.0*   GFR: Estimated Creatinine Clearance: 60.2 mL/min (by C-G formula based on SCr of 0.86 mg/dL). Recent Labs  Lab 07/10/18 0434 07/11/18 0510 07/16/18 2309 07/17/18 0050  WBC 6.5 8.1 9.7  --   LATICACIDVEN  --   --   --  4.8*    Liver Function Tests: Recent Labs  Lab 07/16/18 2309  AST 15  ALT 15  ALKPHOS 58  BILITOT 0.3  PROT 6.4*  ALBUMIN 3.1*   Recent Labs  Lab 07/16/18 2309  LIPASE 31   No results for input(s): AMMONIA in the last 168 hours.  ABG    Component Value Date/Time   PHART 7.221 (L) 07/17/2018 0000   PCO2ART 81.3 (HH) 07/17/2018 0000   PO2ART 169 (H) 07/17/2018 0000   HCO3 26.9 07/17/2018 0000   TCO2 26.8 08/25/2013 1825   ACIDBASEDEF 0.5 02/19/2017 2100   O2SAT 98.7 07/17/2018 0000     Coagulation Profile: Recent Labs  Lab 07/10/18 0434 07/11/18 0510 07/16/18 2309  INR 1.2 1.4* 1.4*    Cardiac Enzymes: No results for input(s): CKTOTAL, CKMB, CKMBINDEX, TROPONINI in the last 168 hours.  HbA1C: Hgb A1c MFr Bld  Date/Time Value Ref Range Status  08/25/2013 08:24 PM 6.0 (H) <5.7 % Final    Comment:    (NOTE)                                                                       According to the ADA Clinical Practice Recommendations for 2011, when HbA1c is used as a screening test:  >=6.5%   Diagnostic of Diabetes Mellitus           (if abnormal result is confirmed) 5.7-6.4%   Increased  risk of developing Diabetes Mellitus References:Diagnosis and Classification of Diabetes Mellitus,Diabetes QTMA,2633,35(KTGYB 1):S62-S69 and Standards of Medical Care in         Diabetes - 2011,Diabetes WLSL,3734,28 (Suppl 1):S11-S61.  06/22/2010 07:35 PM 5.7 (H) <5.7 % Final    Comment:    (NOTE)  According to the ADA Clinical Practice Recommendations for 2011, when HbA1c is used as a screening test:  >=6.5%   Diagnostic of Diabetes Mellitus           (if abnormal result is confirmed) 5.7-6.4%   Increased risk of developing Diabetes Mellitus References:Diagnosis and Classification of Diabetes Mellitus,Diabetes XBDZ,3299,24(QASTM 1):S62-S69 and Standards of Medical Care in         Diabetes - 2011,Diabetes HDQQ,2297,98 (Suppl 1):S11-S61.    CBG: Recent Labs  Lab 07/17/18 0340  GLUCAP 158*    Review of Systems:   unable  Past Medical History  She,  has a past medical history of Acute MI ([place]), Arteriosclerotic cardiovascular disease (ASCVD) (2002), Chronic anticoagulation, Chronic respiratory failure ([place]) (08/27/2013), COPD (chronic obstructive pulmonary disease) ([place]), COPD (chronic obstructive pulmonary disease) ([place]), Factor 5 Leiden mutation, heterozygous ([place]), Factor V Leiden, prothrombin gene mutation ([place]) (2006), Hyperlipidemia, Hypothyroidism, Noncompliance, Pelvic fracture ([place]) (2009), Pulmonary embolism ([place]) (2006), and Tobacco abuse.   Surgical History    Past Surgical History:  Procedure Laterality Date  . COLONOSCOPY  Approximately 2000   Negative screening study  . COLONOSCOPY WITH PROPOFOL N/A 05/28/2018   Procedure: COLONOSCOPY WITH PROPOFOL;  Surgeon: Rogene Houston, MD;  [facility];  Service: Endoscopy;  Laterality: N/A;  . CORONARY ANGIOPLASTY  2002, 2003, 2012  . ESOPHAGOGASTRODUODENOSCOPY (EGD) WITH PROPOFOL N/A 05/26/2018   Procedure: ESOPHAGOGASTRODUODENOSCOPY (EGD) WITH  PROPOFOL;  Surgeon: Rogene Houston, MD;  Location: [facility];  Service: Endoscopy;  Laterality: N/A;  . LEFT AND RIGHT HEART CATHETERIZATION WITH CORONARY ANGIOGRAM N/A 04/03/2011   Procedure: LEFT AND RIGHT HEART CATHETERIZATION WITH CORONARY ANGIOGRAM;  Surgeon: Sherren Mocha, MD;  Location: [facility] CATH LAB;  Service: Cardiovascular;  Laterality: N/A;  . POLYPECTOMY  05/28/2018   Procedure: POLYPECTOMY;  Surgeon: Rogene Houston, MD;  Location: [facility];  Service: Endoscopy;;  cold snare and biopsy forcep     Social History   reports that she has been smoking cigarettes. She started smoking about 50 years ago. She has a 25.00 pack-year smoking history. She has never used smokeless tobacco. She reports that she does not drink alcohol or use drugs.   Family History   Her family history includes Alzheimer's disease in her mother; Emphysema in her mother and sister; Factor V Leiden deficiency in her daughter, father, and sister; Heart disease in her father; Kidney cancer in her paternal grandmother.   Allergies Allergies  Allergen Reactions  . Dilaudid [Hydromorphone Hcl] Hives and Nausea Only  . Minocycline Hcl     REACTION: Dizzy  . Prednisone     REACTION: feels like throat swelling, hallucinations  . Varenicline Tartrate     REACTION: Dizzy(chantix)   . Zocor [Simvastatin - High Dose] Other (See Comments)    myalgia     Home Medications  Prior to Admission medications   Medication Sig Start Date End Date Taking? Authorizing Provider  albuterol (PROVENTIL) (2.5 MG/3ML) 0.083% nebulizer solution USE 1 VIAL BY NEBULIZER EVERY 4 HOURS AS NEEDED FOR WHEEZING. DX: J44.9 Patient taking differently: Take 2.5 mg by nebulization every 4 (four) hours as needed for wheezing or shortness of breath.  01/24/17   Magdalen Spatz, NP  arformoterol (BROVANA) 15 MCG/2ML NEBU Take 2 mLs (15 mcg total) by nebulization 2 (two) times daily. 09/07/17   Juanito Doom, MD  baclofen (LIORESAL) 10  MG tablet Take 1 tablet (10 mg total) by mouth 3 (three) times daily. 01/24/18  Lavina Hamman, MD  bisacodyl (DULCOLAX) 5 MG EC tablet Take 1 tablet (5 mg total) by mouth daily as needed for mild constipation. 05/29/18   Manuella Ghazi, Pratik D, DO  bisoprolol (ZEBETA) 5 MG tablet Take 0.5 tablets (2.5 mg total) by mouth daily. 07/12/18 08/11/18  Manuella Ghazi, Pratik D, DO  budesonide (PULMICORT) 0.5 MG/2ML nebulizer solution Take 2 mLs (0.5 mg total) by nebulization 2 (two) times daily. Dx: J43.9 01/24/17   Magdalen Spatz, NP  buPROPion (WELLBUTRIN SR) 100 MG 12 hr tablet Take 100 mg by mouth every morning.    [provider]  citalopram (CELEXA) 10 MG tablet Take 1 tablet by mouth daily. 04/29/18   [provider]  enoxaparin (LOVENOX) 120 MG/0.8ML injection Inject 0.73 mLs (110 mg total) into the skin daily for 10 days. 07/11/18 07/21/18  Manuella Ghazi, Pratik D, DO  ferrous sulfate 325 (65 FE) MG tablet Take 1 tablet (325 mg total) by mouth daily with breakfast. 07/11/18 08/10/18  Manuella Ghazi, Pratik D, DO  Glycerin-Hypromellose-PEG 400 (DRY EYE RELIEF DROPS) 0.2-0.2-1 % SOLN Place 1-2 drops into both eyes 2 (two) times a day.    [provider]  HYDROcodone-acetaminophen (NORCO/VICODIN) 5-325 MG tablet Take 1 tablet by mouth every 6 (six) hours as needed (BREAKTHROUGH PAIN). 02/19/18   Johnson, Clanford L, MD  ipratropium-albuterol (DUONEB) 0.5-2.5 (3) MG/3ML SOLN Take 3 mLs by nebulization 4 (four) times daily. 03/20/17   Magdalen Spatz, NP  levothyroxine (SYNTHROID, LEVOTHROID) 125 MCG tablet Take 1 tablet (125 mcg total) by mouth daily. 08/16/17   Nafziger, Tommi Rumps, NP  nitroGLYCERIN (NITROSTAT) 0.4 MG SL tablet Place 1 tablet (0.4 mg total) under the tongue every 5 (five) minutes as needed for chest pain. 10/28/14   Herminio Commons, MD  Omega-3 Fatty Acids (FISH OIL) 600 MG CAPS Take 1 capsule by mouth daily.     [provider]  pantoprazole (PROTONIX) 40 MG tablet Take 1 tablet (40 mg total) by mouth  daily. 02/20/18   Murlean Iba, MD  Respiratory Therapy Supplies (FLUTTER) DEVI Use as directed 05/14/15   Javier Glazier, MD  rosuvastatin (CRESTOR) 5 MG tablet Take 1 tablet (5 mg total) by mouth daily at 6 PM. 07/11/18 08/10/18  Manuella Ghazi, Pratik D, DO  ticagrelor (BRILINTA) 60 MG TABS tablet Take 1 tablet (60 mg total) by mouth 2 (two) times daily. 02/01/18   Johnson, Clanford L, MD  traZODone (DESYREL) 50 MG tablet Take 50 mg by mouth at bedtime as needed for sleep.    [provider]  warfarin (COUMADIN) 1 MG tablet Take 5-6 mg by mouth See admin instructions. On Sunday, Tuesday, Thursday, and Saturday, patient takes 6mg ; on all other days, patient takes 5mg     [provider]     Critical care time: 45 mins     Georgann Housekeeper, AGACNP-BC [place] Pager [phone] or [phone]  07/17/2018 4:38 AM

## 2018-07-17 NOTE — ED Notes (Signed)
Report given to [facility] on [facility]

## 2018-07-17 NOTE — Progress Notes (Signed)
NAME:  [name], MRN:  [id number], DOB:  1950/05/23, LOS: 0 ADMISSION DATE:  07/16/2018, CONSULTATION DATE:  7/8 REFERRING MD: Dr. Sedonia Small EDP, CHIEF COMPLAINT:  Hematemesis    Brief History   68 year old with Factor V Leiden on warfarin presented with hematemesis. Coded in [facility] ED for 12 minutes. Coagulopathy reversed with Vit K, [place] Transferred to [facility].   Past Medical History   has a past medical history of Acute MI ([place]), Arteriosclerotic cardiovascular disease (ASCVD) (2002), Chronic anticoagulation, Chronic respiratory failure ([place]) (08/27/2013), COPD (chronic obstructive pulmonary disease) ([place]), COPD (chronic obstructive pulmonary disease) ([place]), Factor 5 Leiden mutation, heterozygous ([place]), Factor V Leiden, prothrombin gene mutation ([place]) (2006), Hyperlipidemia, Hypothyroidism, Noncompliance, Pelvic fracture ([place]) (2009), Pulmonary embolism ([place]) (2006), and Tobacco abuse.  Significant Hospital Events   7/8 present with hematemesis, cardiac arrest 12 mins PEA.   Consults:    Procedures:  ETT 7/8 > CVL 7/8 >  Significant Diagnostic Tests:  Endoscopy 05/28/18 > Normal esophagus, normal stomach  Micro Data:  Blood 7/8 > Sputum 7/8 >  Antimicrobials:  Unasyn 7/8 >>  Interim history/subjective:  No acute events. Continues on vent. Hemodynamically stable  Objective   Blood pressure (!) 137/58, pulse 70, temperature 98.6 F (37 C), temperature source Oral, resp. rate 18, height 5\' 4"  (1.626 m), weight 67.5 kg, SpO2 100 %.    Vent Mode: PRVC FiO2 (%):  [60 %-100 %] 70 % Set Rate:  [16 bmp-18 bmp] 18 bmp Vt Set:  [430 mL-550 mL] 430 mL PEEP:  [5 cmH20] 5 cmH20 Plateau Pressure:  [17 cmH20-36 cmH20] 17 cmH20   Intake/Output Summary (Last 24 hours) at 07/17/2018 0934 Last data filed at 07/17/2018 0900 Gross per 24 hour  Intake 1881.8 ml  Output 350 ml  Net 1531.8 ml   Filed Weights   07/16/18 2326 07/17/18 0345  Weight: 70.1 kg 67.5 kg    Examination:  Gen:      No acute distress HEENT:  EOMI, sclera anicteric Neck:     No masses; no thyromegaly, ETT Lungs:    Clear to auscultation bilaterally; normal respiratory effort CV:         Regular rate and rhythm; no murmurs Abd:      + bowel sounds; soft, non-tender; no palpable masses, no distension Ext:    No edema; adequate peripheral perfusion Skin:      Warm and dry; no rash Neuro: Awake, responsive  Resolved Hospital Problem list     Assessment & Plan:   Cardiac arrest: PEA. 12 mins duration. Likely in the setting of hemorrhagic shock vs profound hypoxia.  Hypothermia deferred as she is neurologically intact Continue supportive cre  Hematemesis: no known history of liver disease or alcohol abuse. REcent endoscopy with no signs of bleeding. She is on warfarin for Factor V Leiden. PCCs and vitamin K given in ED.  Continue protonix drip Octreotide gtt stopped Follow H/H. [place] [facility] consulted 7/8  Acute on chronic hypoxemic respiratory failure: suspect she may have aspirated. Acute on Chronic hypercarbic respiratory failure . COPD without acute exacerbation. Patient of Dr. [place] Bells.  Continue vent Unasyn BDs  CAD Holding Brilinta   Factor V Leiden Holding warfarin  Hypothyroid TSH is high Start synthyroid Check free t3, t4  Best practice:  Diet: NPO Pain/Anxiety/Delirium protocol (if indicated): Propofol for RASS goal -1 to -2.  VAP protocol (if indicated): Yes DVT prophylaxis: SCDs GI prophylaxis: PPI gtt Glucose control: NA Mobility: BR Code Status:  FULL Family Communication:  I called daughter again in AM of 7/8 but got voice mail. Left a message requesting call back.  Disposition: ICU  The patient is critically ill with multiple organ system failure and requires high complexity decision making for assessment and support, frequent evaluation and titration of therapies, advanced monitoring, review of radiographic studies and interpretation of complex data.    Critical Care Time devoted to patient care services, exclusive of separately billable procedures, described in this note is 35  minutes.   Marshell Garfinkel MD [place] [facility] Pager [phone] If no answer call [phone] 07/17/2018, 9:34 AM

## 2018-07-18 ENCOUNTER — Encounter (HOSPITAL_COMMUNITY)
Admission: EM | Disposition: A | Discharge status: Skilled Nursing Facility | Payer: Self-pay | Source: Home / Self Care | Attending: Pulmonary Disease

## 2018-07-18 ENCOUNTER — Inpatient Hospital Stay (HOSPITAL_COMMUNITY): Payer: Medicare Other

## 2018-07-18 LAB — PROTIME-INR
INR: 1.2 (ref 0.8–1.2)
Prothrombin Time: 15 seconds (ref 11.4–15.2)

## 2018-07-18 LAB — CBC
HCT: 30.9 % — ABNORMAL LOW (ref 36.0–46.0)
Hemoglobin: 9.8 g/dL — ABNORMAL LOW (ref 12.0–15.0)
MCH: 30.2 pg (ref 26.0–34.0)
MCHC: 31.7 g/dL (ref 30.0–36.0)
MCV: 95.1 fL (ref 80.0–100.0)
Platelets: 189 10*3/uL (ref 150–400)
RBC: 3.25 MIL/uL — ABNORMAL LOW (ref 3.87–5.11)
RDW: 15.4 % (ref 11.5–15.5)
WBC: 10.1 10*3/uL (ref 4.0–10.5)
nRBC: 0 % (ref 0.0–0.2)

## 2018-07-18 LAB — T4, FREE: Free T4: 1.05 ng/dL (ref 0.61–1.12)

## 2018-07-18 SURGERY — EGD (ESOPHAGOGASTRODUODENOSCOPY)
Anesthesia: Moderate Sedation

## 2018-07-18 MED ORDER — ACETAMINOPHEN 325 MG PO TABS
650.0000 mg | ORAL_TABLET | ORAL | Status: DC | PRN
  Administered 2018-07-18 (×2): 650 mg via ORAL
  Filled 2018-07-18 (×2): qty 2

## 2018-07-18 MED ORDER — ORAL CARE MOUTH RINSE
15.0000 mL | Freq: Two times a day (BID) | OROMUCOSAL | Status: DC
  Administered 2018-07-20: 16:00:00 15 mL via OROMUCOSAL

## 2018-07-18 MED ORDER — CHLORHEXIDINE GLUCONATE 0.12 % MT SOLN
15.0000 mL | Freq: Two times a day (BID) | OROMUCOSAL | Status: DC
  Administered 2018-07-19: 15 mL via OROMUCOSAL
  Filled 2018-07-18 (×4): qty 15

## 2018-07-18 MED ORDER — HYDROCODONE-ACETAMINOPHEN 5-325 MG PO TABS
1.0000 | ORAL_TABLET | Freq: Four times a day (QID) | ORAL | Status: DC | PRN
  Administered 2018-07-18 – 2018-07-19 (×4): 1 via ORAL
  Filled 2018-07-18 (×4): qty 1

## 2018-07-18 MED ORDER — PANTOPRAZOLE SODIUM 40 MG IV SOLR
40.0000 mg | Freq: Two times a day (BID) | INTRAVENOUS | Status: DC
  Administered 2018-07-18 – 2018-07-27 (×17): 40 mg via INTRAVENOUS
  Filled 2018-07-18 (×19): qty 40

## 2018-07-18 MED FILL — Medication: Qty: 1 | Status: AC

## 2018-07-18 NOTE — Progress Notes (Signed)
NAME:  [name], MRN:  [id number], DOB:  1950/05/27, LOS: 1 ADMISSION DATE:  07/16/2018, CONSULTATION DATE:  7/8 REFERRING MD: Dr. Sedonia Small EDP, CHIEF COMPLAINT:  Hematemesis    Brief History   68 year old with Factor V Leiden on warfarin presented with hematemesis. Coded in [facility] ED for 12 minutes. Coagulopathy reversed with Vit K, [place] Transferred to [facility].   Past Medical History   has a past medical history of Acute MI ([place]), Arteriosclerotic cardiovascular disease (ASCVD) (2002), Chronic anticoagulation, Chronic respiratory failure ([place]) (08/27/2013), COPD (chronic obstructive pulmonary disease) ([place]), COPD (chronic obstructive pulmonary disease) ([place]), Factor 5 Leiden mutation, heterozygous ([place]), Factor V Leiden, prothrombin gene mutation ([place]) (2006), Hyperlipidemia, Hypothyroidism, Noncompliance, Pelvic fracture ([place]) (2009), Pulmonary embolism ([place]) (2006), and Tobacco abuse.  Significant Hospital Events   7/8 present with hematemesis, cardiac arrest 12 mins PEA.   Consults:  7/8 Gastroenterology  Procedures:  ETT 7/8 > CVL 7/8 >  Significant Diagnostic Tests:  Endoscopy 05/28/18 > Normal esophagus, normal stomach  Micro Data:  Blood 7/8 > Sputum 7/8 > NGTD  Antimicrobials:  Unasyn 7/8 >>  Interim history/subjective:  No acute events overnght - Continues on vent. Hemodynamically stable. - Continuing to drain blood from NG tube - Wanted to know what happened and complains of throat and abdominal pain.  Objective   Blood pressure 124/64, pulse 62, temperature 98.7 F (37.1 C), temperature source Oral, resp. rate 18, height 5\' 4"  (1.626 m), weight 67.5 kg, SpO2 97 %.    Vent Mode: PRVC FiO2 (%):  [40 %-50 %] 40 % Set Rate:  [18 bmp] 18 bmp Vt Set:  [430 mL] 430 mL PEEP:  [5 cmH20] 5 cmH20 Plateau Pressure:  [22 cmH20-31 cmH20] 24 cmH20   Intake/Output Summary (Last 24 hours) at 07/18/2018 0803 Last data filed at 07/18/2018 0700 Gross per 24 hour  Intake  2244.48 ml  Output 1225 ml  Net 1019.48 ml   Filed Weights   07/16/18 2326 07/17/18 0345  Weight: 70.1 kg 67.5 kg    Examination: Gen: awake and lying in bed in No acute distress.  HEENT: EOMI, sclera anicteric, NGT and ETT in place Neck: No masses; no thyromegaly Lungs: CTAB with normal WOB on vent CV: Regular rate and rhythm; no murmurs Abd: + bowel sounds; soft, tender to bilateral LQ; no palpable masses, no distension Ext: No edema; adequate peripheral perfusion Skin: Warm and dry; no rash Neuro: Awake and responsive  Resolved Hospital Problem list     Assessment & Plan:  Neuro - Propofol scheduled and Fentanyl PRN, goal RASS 0 to -1  Cardiac arrest: PEA. 12 mins duration. Likely in the setting of hemorrhagic shock vs profound hypoxia.  Hypothermia deferred as she is neurologically intact. - Continue supportive care  CAD - Holding Brilinta  Acute on chronic hypoxemic respiratory failure: suspect she may have aspirated. Acute on Chronic hypercarbic respiratory failure . COPD without acute exacerbation. Patient of Dr. [place] Bells.  Leukocytosis WBC downtrending from 26 to 10.1 today. - Continue vent, with SBT today - Continue Unasyn 3g q6h IV - Continue Brovana and Pulmicort - Morning CXR - Trend lactate  Normocytic anemia Hematemesis: no known history of liver disease or alcohol abuse. Recent endoscopy with no signs of bleeding. She is on warfarin for Factor V Leiden. PCCs and vitamin K given in ED. NG continuing to drain blood. - Continue protonix drip - Follow H/H, transfuse if Hgb<7 - EGD planned, GI c/s  Factor V  Leiden - Holding warfarin  Elevated lactate lactic 2.2-> 2.7 - trend lactate  Hypothyroid - TSH elevated at 12.53 - Continue synthyroid - F/u free t3, t4  Best practice:  Diet: NPO Pain/Anxiety/Delirium protocol (if indicated): Propofol for RASS goal -1 to -2.  VAP protocol (if indicated): Yes DVT prophylaxis: SCDs GI prophylaxis: PPI gtt  Glucose control: NA Mobility: BR Code Status: FULL Family Communication:  Called daughter 7/9 and got voice mail Disposition: ICU  The patient is critically ill with multiple organ system failure and requires high complexity decision making for assessment and support, frequent evaluation and titration of therapies, advanced monitoring, review of radiographic studies and interpretation of complex data.   Critical Care Time devoted to patient care services, exclusive of separately billable procedures, described in this note is 35  minutes.   [place] [employer] IV, [place] screening examination/treatment/procedure(s) were conducted as a shared visit with non-physician practitioner(s) and myself.  I personally evaluated the patient during the encounter.  ------------------------------------- Attending note: I have seen and examined the patient. History, labs and imaging reviewed.  Brief: 68 year old with factor V Le[place] on anticoagulation admitted with hematemesis.  Suffered a brief cardiac arrest in ED  Interim Awake on the ventilator, complains of throat pain Improving  bloody output from NG tube.  Blood pressure 128/61, pulse 67, temperature 98.7 F (37.1 C), temperature source Oral, resp. rate 17, height 5\' 4"  (1.626 m), weight 67.5 kg, SpO2 99 %. Gen:      No acute distress HEENT:  EOMI, sclera anicteric, ETT Neck:     No masses; no thyromegaly Lungs:    Clear to auscultation bilaterally; normal respiratory effort CV:         Regular rate and rhythm; no murmurs Abd:      + bowel sounds; soft, non-tender; no palpable masses, no distension Ext:    No edema; adequate peripheral perfusion Skin:      Warm and dry; no rash Neuro: alert and oriented x 3 Psych: normal mood and affect   Labs reviewed, significant for WBC 10.1, hemoglobin 9.8, platelets 189  Assessment/plan: Hematemesis Continue Protonix drip.  Follow H&H Will need EGD during this admission.   Cannot be done today as she is on Brilinta.  Discussed with GI  Acute on chronic respiratory failure, history of COPD Continue Unasyn for aspiration Start weaning.  Hopeful for extubation today We will need follow-up CT to make sure the retrocardiac opacity is improving  The patient is critically ill with multiple organ systems failure and requires high complexity decision making for assessment and support, frequent evaluation and titration of therapies, application of advanced monitoring technologies and extensive interpretation of multiple databases.  Critical care time - 35 mins. This represents my time independent of the NPs time taking care of the pt.  Marshell Garfinkel MD [place] [facility] 07/18/2018, 11:58 AM

## 2018-07-18 NOTE — Procedures (Addendum)
Extubation Procedure Note  Patient Details:   Name: [name] DOB: Jul 07, 1950 MRN: [id number]   Airway Documentation:    Vent end date: 07/18/18 Vent end time: 1353   Evaluation  O2 sats: stable throughout Complications: No apparent complications Patient did tolerate procedure well. Bilateral Breath Sounds: Diminished   Yes, able to speak.   Pt extubated to 4LPM per MD order. Cuff leak heard before extubation. No apparent complications. Pt is alert to time and place. RN and RT at bedside.   Esperanza Sheets T 07/18/2018, 1:54 PM

## 2018-07-18 NOTE — Progress Notes (Addendum)
Daily Rounding Note  07/18/2018, 11:23 AM  LOS: 1 day   SUBJECTIVE:   Chief complaint: hematemesis      EGD on hold to allow for washout of Ticagrelor CCM initiated vent wean this AM, pt doing well so far.   Pt recalls few details surrounding recent hematemesis, not sure if it started from the "get go" or occurred after episodes of non-bloody retching/emesis.  Pt confirms no use of NSAIDs, ASA PTA Chest hurts from compressions but denies belly pain Hungry.   No BM's Small layer of red blood at bottom of OGT canister but overwhelming majority of the 500 cc in canister is clear, watery (not even bilious) liquid: same clear liquid is in the OG tubing.   Subglottic suctionate consist of mucoid red blood; volume in canister ~ 100 cc   OBJECTIVE:         Vital signs in last 24 hours:    Temp:  [98.7 F (37.1 C)-100.3 F (37.9 C)] 98.7 F (37.1 C) (07/09 0756) Pulse Rate:  [59-96] 67 (07/09 1022) Resp:  [16-20] 17 (07/09 1022) BP: (111-163)/(49-96) 128/61 (07/09 1022) SpO2:  [94 %-100 %] 99 % (07/09 0830) FiO2 (%):  [30 %-40 %] 30 % (07/09 1022) Last BM Date: (UTA/PTA) Filed Weights   07/16/18 2326 07/17/18 0345  Weight: 70.1 kg 67.5 kg   General: alert, comfortable, able to respond to questions, writes some answers on paper   Heart: RRR Chest: clear bil in front.  No labored breathing on vent Abdomen: soft, NT, ND.  BS hypoactive  Extremities: no CCE Neuro/Psych:  Appropriate.  Follows commands.  Calm.    Lab Results: Recent Labs    07/17/18 0353 07/17/18 1544 07/18/18 0349  WBC 26.0* 18.1* 10.1  HGB 11.5* 10.7* 9.8*  HCT 36.4 33.1* 30.9*  PLT 228 231 189   BMET Recent Labs    07/16/18 2309 07/17/18 0353  NA 141 142  K 4.2 4.2  CL 97* 102  CO2 37* 33*  GLUCOSE 200* 181*  BUN 15 16  CREATININE 0.86 0.88  CALCIUM 8.0* 7.1*   LFT Recent Labs    07/16/18 2309  PROT 6.4*  ALBUMIN 3.1*  AST 15   ALT 15  ALKPHOS 58  BILITOT 0.3   PT/INR Recent Labs    07/17/18 0353 07/18/18 0349  LABPROT 14.6 15.0  INR 1.2 1.2   Hepatitis Panel No results for input(s): HEPBSAG, HCVAB, HEPAIGM, HEPBIGM in the last 72 hours.  Studies/Results: Dg Chest Port 1 View  Result Date: 07/17/2018 CLINICAL DATA:  Post intubation. EXAM: PORTABLE CHEST 1 VIEW COMPARISON:  July 04, 2018 FINDINGS: The endotracheal tube terminates above the carina by approximately 4.8 cm. The enteric tube appears to extend below the left hemidiaphragm. The heart size is enlarged. There is a dense retrocardiac opacity. There are worsening interstitial lung markings bilaterally. There is no pneumothorax. IMPRESSION: 1. Lines and tubes as above. 2. Interval worsening of a dense retrocardiac opacity as seen on prior studies. 3. Worsening interstitial lung markings bilaterally. 4. Stable cardiac enlargement. Electronically Signed   By: Constance Holster M.D.   On: 07/17/2018 00:08   Scheduled Meds: . arformoterol  15 mcg Nebulization BID  . budesonide (PULMICORT) nebulizer solution  0.5 mg Nebulization BID  . chlorhexidine gluconate (MEDLINE KIT)  15 mL Mouth Rinse BID  . Chlorhexidine Gluconate Cloth  6 each Topical Daily  . levothyroxine  62.5 mcg Intravenous Daily  .  mouth rinse  15 mL Mouth Rinse 10 times per day  . sodium chloride flush  10-40 mL Intracatheter Q12H   Continuous Infusions: . ampicillin-sulbactam (UNASYN) IV 3 g (07/18/18 1112)  . lactated ringers 50 mL/hr at 07/18/18 0800  . pantoprozole (PROTONIX) infusion 8 mg/hr (07/18/18 0818)  . propofol (DIPRIVAN) infusion 10 mcg/kg/min (07/18/18 0800)   PRN Meds:.fentaNYL (SUBLIMAZE) injection, fentaNYL (SUBLIMAZE) injection, sodium chloride flush  ASSESMENT:   *    Hematemesis.  ? MWT, ? Dielafoy's ulcer given bland EGD findings 5 weeks ago.   05/2018 EGD and colonoscopy for evaluation of melena and anemia.  The upper endoscopy was normal.  Had adenomatous  polyps, diverticulosis, hemorrhoids, non-bleeding small anal ulcer on colonoscopy. PPI drip continues.  Taking Protonix 40 mg/day PTA (???), it was RXd.    *   Blood loss anemia.  Hgb 9 >> 11.5 >> 9.8. in last 48 hours, 3 PRBCs given at [facility] ED, none since.       07-24-22 PEA arrest, coded for 12 minutes.? aspiration as initiating event?  *   Chronic Coumadin and Brilinta.  For factor V Leiden and drug-eluting coronary stents. Subtherapeutic INR.  Both meds on hold.  Conservative estimate, given her arrival to ED at 11:20 PM on 07-24-22, is last Brilinta was on Jul 24, 2022   PLAN   *  Convert to BID IV Protonix when current drip infusion finishes.    *   EGD (enteroscopy?) on 7/12 (after 5 days off Ticagrelor).    *  When extubated, and if safe for PO after swallow eval, ok to start clears.      Azucena Freed  07/18/2018, 11:23 AM Phone [phone]   ________________________________________________________________________  Velora Heckler GI MD note:  I personally examined the patient, reviewed the data and agree with the assessment and plan described above.  Will follow along, hoping to allow full blood thinner washout however if she overtly bleeds again sooner then I think upper endoscopy at that point would be very helpful   Owens Loffler, MD [facility] Gastroenterology Pager [phone]

## 2018-07-19 DIAGNOSIS — J449 Chronic obstructive pulmonary disease, unspecified: Secondary | ICD-10-CM

## 2018-07-19 DIAGNOSIS — D682 Hereditary deficiency of other clotting factors: Secondary | ICD-10-CM

## 2018-07-19 LAB — BASIC METABOLIC PANEL
Anion gap: 9 (ref 5–15)
BUN: 9 mg/dL (ref 8–23)
CO2: 26 mmol/L (ref 22–32)
Calcium: 7.5 mg/dL — ABNORMAL LOW (ref 8.9–10.3)
Chloride: 106 mmol/L (ref 98–111)
Creatinine, Ser: 0.7 mg/dL (ref 0.44–1.00)
GFR calc Af Amer: >60 mL/min (ref >60)
GFR calc non Af Amer: >60 mL/min (ref >60)
Glucose, Bld: 99 mg/dL (ref 70–99)
Potassium: 3.4 mmol/L — ABNORMAL LOW (ref 3.5–5.1)
Sodium: 141 mmol/L (ref 135–145)

## 2018-07-19 LAB — CBC WITH DIFFERENTIAL/PLATELET
Abs Immature Granulocytes: 0.05 10*3/uL (ref 0.00–0.07)
Basophils Absolute: 0 10*3/uL (ref 0.0–0.1)
Basophils Relative: 0 %
Eosinophils Absolute: 0.1 10*3/uL (ref 0.0–0.5)
Eosinophils Relative: 1 %
HCT: 34 % — ABNORMAL LOW (ref 36.0–46.0)
Hemoglobin: 10.7 g/dL — ABNORMAL LOW (ref 12.0–15.0)
Immature Granulocytes: 1 %
Lymphocytes Relative: 14 %
Lymphs Abs: 1.1 10*3/uL (ref 0.7–4.0)
MCH: 30.5 pg (ref 26.0–34.0)
MCHC: 31.5 g/dL (ref 30.0–36.0)
MCV: 96.9 fL (ref 80.0–100.0)
Monocytes Absolute: 0.7 10*3/uL (ref 0.1–1.0)
Monocytes Relative: 9 %
Neutro Abs: 6.2 10*3/uL (ref 1.7–7.7)
Neutrophils Relative %: 75 %
Platelets: 168 10*3/uL (ref 150–400)
RBC: 3.51 MIL/uL — ABNORMAL LOW (ref 3.87–5.11)
RDW: 15 % (ref 11.5–15.5)
WBC: 8.2 10*3/uL (ref 4.0–10.5)
nRBC: 0 % (ref 0.0–0.2)

## 2018-07-19 LAB — CBC
HCT: 30.4 % — ABNORMAL LOW (ref 36.0–46.0)
Hemoglobin: 9.6 g/dL — ABNORMAL LOW (ref 12.0–15.0)
MCH: 30.5 pg (ref 26.0–34.0)
MCHC: 31.6 g/dL (ref 30.0–36.0)
MCV: 96.5 fL (ref 80.0–100.0)
Platelets: 164 10*3/uL (ref 150–400)
RBC: 3.15 MIL/uL — ABNORMAL LOW (ref 3.87–5.11)
RDW: 15.3 % (ref 11.5–15.5)
WBC: 7.6 10*3/uL (ref 4.0–10.5)
nRBC: 0 % (ref 0.0–0.2)

## 2018-07-19 LAB — T3, FREE: T3, Free: 1 pg/mL — ABNORMAL LOW (ref 2.0–4.4)

## 2018-07-19 LAB — HEMOGLOBIN AND HEMATOCRIT, BLOOD
HCT: 34.2 % — ABNORMAL LOW (ref 36.0–46.0)
HCT: 36.7 % (ref 36.0–46.0)
Hemoglobin: 10.6 g/dL — ABNORMAL LOW (ref 12.0–15.0)
Hemoglobin: 11.1 g/dL — ABNORMAL LOW (ref 12.0–15.0)

## 2018-07-19 LAB — CULTURE, RESPIRATORY W GRAM STAIN: Culture: NORMAL

## 2018-07-19 LAB — PROTIME-INR
INR: 1.1 (ref 0.8–1.2)
Prothrombin Time: 14.5 seconds (ref 11.4–15.2)

## 2018-07-19 MED ORDER — ADULT MULTIVITAMIN W/MINERALS CH
1.0000 | ORAL_TABLET | Freq: Every day | ORAL | Status: DC
  Administered 2018-07-19 – 2018-07-26 (×4): 1 via ORAL
  Filled 2018-07-19 (×7): qty 1

## 2018-07-19 MED ORDER — MORPHINE SULFATE (PF) 2 MG/ML IV SOLN
2.0000 mg | INTRAVENOUS | Status: DC | PRN
  Administered 2018-07-19 (×2): 4 mg via INTRAVENOUS
  Administered 2018-07-19 (×2): 2 mg via INTRAVENOUS
  Administered 2018-07-20 (×2): 4 mg via INTRAVENOUS
  Administered 2018-07-22 – 2018-07-23 (×6): 2 mg via INTRAVENOUS
  Administered 2018-07-23 – 2018-07-24 (×4): 4 mg via INTRAVENOUS
  Administered 2018-07-24: 2 mg via INTRAVENOUS
  Administered 2018-07-24 – 2018-07-25 (×4): 4 mg via INTRAVENOUS
  Filled 2018-07-19 (×4): qty 1
  Filled 2018-07-19 (×2): qty 2
  Filled 2018-07-19: qty 1
  Filled 2018-07-19: qty 2
  Filled 2018-07-19: qty 1
  Filled 2018-07-19 (×5): qty 2
  Filled 2018-07-19: qty 1
  Filled 2018-07-19 (×3): qty 2
  Filled 2018-07-19 (×2): qty 1
  Filled 2018-07-19: qty 2
  Filled 2018-07-19: qty 1

## 2018-07-19 MED ORDER — BOOST / RESOURCE BREEZE PO LIQD CUSTOM
1.0000 | Freq: Three times a day (TID) | ORAL | Status: DC
  Administered 2018-07-19 (×2): 1 via ORAL

## 2018-07-19 MED ORDER — PRO-STAT SUGAR FREE PO LIQD
30.0000 mL | Freq: Two times a day (BID) | ORAL | Status: DC
  Administered 2018-07-19 (×2): 30 mL via ORAL
  Filled 2018-07-19 (×2): qty 30

## 2018-07-19 MED ORDER — ONDANSETRON HCL 4 MG/2ML IJ SOLN
4.0000 mg | Freq: Four times a day (QID) | INTRAMUSCULAR | Status: DC | PRN
  Administered 2018-07-19 – 2018-07-20 (×2): 4 mg via INTRAVENOUS
  Filled 2018-07-19 (×2): qty 2

## 2018-07-19 NOTE — Progress Notes (Signed)
Pt noted to being vomitting upon arrival to 5w. MD notified. RN to continue to monitor.

## 2018-07-19 NOTE — TOC Initial Note (Signed)
Transition of Care [facility]) - Initial/Assessment Note    Patient Details  Name: [name] MRN: [id number] Date of Birth: 09/19/1950  [facility] CM/SW Contact:    Midge Minium RN, BSN, NCM-BC, ACM-RN [phone] Phone Number: 07/19/2018, 12:34 PM  Clinical Narrative:                 CM following for dispositional needs. 68 year old female with a history of Factor V Leiden on warfarin presented with hematemesis. Patient is a readmit with recent DC on 7/2 at [facility]. APS was made aware and was working to get the patient to her choice ST SNF; ST SNF was recommended during previous admissions, but the patient elected to transition home. Home Health has refused previous referrals due to the patients unsafe home situation and bed bugs. Please consult PT/OT for DCP recommendations to determine current needs and if ST SNF is recommended, PT/OT notes will be required for insurance authorization. CM team will continue to follow.    Expected Discharge Plan: [place] Barriers to Discharge: Continued Medical Work up, Unsafe home situation(per report unsafe home situation/self neglect and bed bugs were reports last admission one week ago)    Expected Discharge Plan and Services Expected Discharge Plan: [place]   Discharge Planning Services: CM Consult   Living arrangements for the past 2 months: Single Family Home  Prior Living Arrangements/Services Living arrangements for the past 2 months: Single Family Home Lives with:: Self  Activities of Daily Living Home Assistive Devices/Equipment: Oxygen ADL Screening (condition at time of admission) Patient's cognitive ability adequate to safely complete daily activities?: Yes Is the patient deaf or have difficulty hearing?: No Does the patient have difficulty seeing, even when wearing glasses/contacts?: No Does the patient have difficulty concentrating, remembering, or making decisions?: No Patient  able to express need for assistance with ADLs?: Yes Does the patient have difficulty dressing or bathing?: No Independently performs ADLs?: Yes (appropriate for developmental age) Does the patient have difficulty walking or climbing stairs?: Yes Weakness of Legs: Both Weakness of Arms/Hands: Both   Admission diagnosis:  Cardiac arrest ([place]) [I46.9] Hypovolemic shock ([place]) [R57.1] Anticoagulated [Z79.01] Encounter for intubation [Z01.818] Gastrointestinal hemorrhage, unspecified gastrointestinal hemorrhage type [K92.2] Hematemesis, presence of nausea not specified [K92.0] Patient Active Problem List   Diagnosis Date Noted  . GI bleed 07/17/2018  . UGI bleed 07/17/2018  . Cardiac arrest ([place])   . Hematemesis   . Elevated troponin   . Malnutrition of moderate degree 05/24/2018  . Acute on chronic respiratory failure ([place]) 05/23/2018  . Melena 05/23/2018  . Factor V deficiency ([place]) 05/23/2018  . Depression 05/23/2018  . Chronic pain syndrome 05/23/2018  . Hypercapnia 05/23/2018  . Acute on chronic respiratory failure with hypoxia and hypercapnia ([place]) 01/29/2018  . Acute and chronic respiratory failure (acute-on-chronic) ([place]) 01/29/2018  . Pressure injury of skin 01/29/2018  . Hyperglycemia 01/21/2018  . COPD with exacerbation ([place]) 01/21/2018  . Vertebral fracture, osteoporotic ([place]) 01/20/2018  . Rectal bleeding 09/05/2017  . Left lower quadrant pain 09/05/2017  . Oxygen dependent 09/05/2017  . Chronic back pain 05/16/2017  . COPD exacerbation ([place]) 02/19/2017  . Tachycardia 02/19/2017  . Influenza A 02/19/2017  . Acute respiratory failure ([place]) 02/19/2017  . Dyspnea 11/26/2014  . Loss of weight 11/26/2014  . Chronic respiratory failure ([place]) 08/27/2013  . Chest pain 08/24/2013  . Encounter for therapeutic drug monitoring 02/13/2013  . Abnormal weight loss 03/21/2012  .  Cerebrovascular disease 11/15/2011  . CAD S/P percutaneous coronary angioplasty   . Factor V  Leiden, prothrombin gene mutation ([place])   . Hyperlipidemia   . Chronic anticoagulation   . Hypothyroidism 11/23/2006  . TOBACCO ABUSE 11/23/2006  . History of pulmonary embolus (PE) 11/23/2006  . 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   PCP:  [name], NP Pharmacy:   [place], [place] [address] [place] [address] Phone: [phone] Fax: [place], [place] [address] Phone: [phone] Fax: [phone]     Social Determinants of Health (SDOH) Interventions    Readmission Risk Interventions Readmission Risk Prevention Plan 07/19/2018 05/29/2018  Transportation Screening - Complete  Medication Review (RN Care Manager) Referral to Pharmacy Complete  PCP or Specialist appointment within 3-5 days of discharge Not Complete -  PCP/Specialist Appt Not Complete comments Continued medical workup -  HRI or Home Care Consult Complete Complete  SW Recovery Care/Counseling Consult Complete Complete  Palliative Care Screening Not Applicable Not Applicable  Skilled Nursing Facility Complete Patient Refused  Some recent data might be hidden

## 2018-07-19 NOTE — Progress Notes (Addendum)
PROGRESS NOTE    [name]  SWH:[id number]  DOB: Sep 25, 1950  DOA: 07/16/2018 PCP: [name], NP  Brief Narrative: 68 year old female with hyperlipidemia, hypothyroidism, CAD, tobacco use, COPD with associated chronic respiratory failure/home O2, history of factor V Leyden deficiency/PE on chronic anticoagulation presented to [facility] ED with complaints of hematemesis and had cardiac arrest/coded for 12 minutes.  She was intubated and subsequently transferred to ICU at [facility].  She was extubated on July 9 and transferred out of ICU.  She was started on Unasyn for possible aspiration pneumonia/retrocardiac infiltrate.  She did have recent EGD in May which was unremarkable.  GI consulted again in this admission.   Subjective: She denies any acute complaints except for chest pain at the site of CPR .  Denies any further hematemesis.  Still in ICU bed  Objective: Vitals:   07/19/18 1100 07/19/18 1137 07/19/18 1200 07/19/18 1300  BP: (!) 127/59  124/62 (!) 96/45  Pulse: 70  88 68  Resp: 13  19 14   Temp:  97.7 F (36.5 C)    TempSrc:  Oral    SpO2: 98%  93% 97%  Weight:      Height:        Intake/Output Summary (Last 24 hours) at 07/19/2018 1325 Last data filed at 07/19/2018 1300 Gross per 24 hour  Intake 2811.51 ml  Output 1525 ml  Net 1286.51 ml   Filed Weights   07/16/18 2326 07/17/18 0345 07/19/18 0500  Weight: 70.1 kg 67.5 kg 68.5 kg    Physical Examination:  General exam: Appears calm and comfortable  Respiratory system: Clear to auscultation. Respiratory effort normal. Cardiovascular system: S1 & S2 heard, RRR.  Reproducible chest wall pain, trace to 1+ pitting edema Gastrointestinal system: Abdomen is nondistended, soft and nontender. No organomegaly or masses felt. Normal bowel sounds heard. Central nervous system: Alert and oriented. No focal neurological deficits. Extremities: 1+ pitting edema. Skin: Hyperpigmentation and what appears to be stasis  dermatitis Psychiatry: Judgement and insight appear normal. Mood & affect appropriate.     Data Reviewed: I have personally reviewed following labs and imaging studies  CBC: Recent Labs  Lab 07/17/18 0353 07/17/18 1544 07/18/18 0349 07/19/18 0318 07/19/18 0754  WBC 26.0* 18.1* 10.1 7.6 8.2  NEUTROABS 23.5*  --   --   --  6.2  HGB 11.5* 10.7* 9.8* 9.6* 10.7*  HCT 36.4 33.1* 30.9* 30.4* 34.0*  MCV 97.1 94.3 95.1 96.5 96.9  PLT 228 231 189 164 665   Basic Metabolic Panel: Recent Labs  Lab 07/16/18 2309 07/17/18 0353 07/19/18 0754  NA 141 142 141  K 4.2 4.2 3.4*  CL 97* 102 106  CO2 37* 33* 26  GLUCOSE 200* 181* 99  BUN 15 16 9   CREATININE 0.86 0.88 0.70  CALCIUM 8.0* 7.1* 7.5*  MG  --  1.6*  --    GFR: Estimated Creatinine Clearance: 64 mL/min (by C-G formula based on SCr of 0.7 mg/dL). Liver Function Tests: Recent Labs  Lab 07/16/18 2309  AST 15  ALT 15  ALKPHOS 58  BILITOT 0.3  PROT 6.4*  ALBUMIN 3.1*   Recent Labs  Lab 07/16/18 2309  LIPASE 31   No results for input(s): AMMONIA in the last 168 hours. Coagulation Profile: Recent Labs  Lab 07/16/18 2309 07/17/18 0353 07/18/18 0349 07/19/18 0318  INR 1.4* 1.2 1.2 1.1   Cardiac Enzymes: No results for input(s): CKTOTAL, CKMB, CKMBINDEX, TROPONINI in the last 168 hours. BNP (last 3  results) No results for input(s): PROBNP in the last 8760 hours. HbA1C: No results for input(s): HGBA1C in the last 72 hours. CBG: Recent Labs  Lab 07/17/18 0340  GLUCAP 158*   Lipid Profile: Recent Labs    07/17/18 0352  TRIG 77   Thyroid Function Tests: Recent Labs    07/17/18 0500 07/18/18 1300 07/18/18 1302  TSH 12.532*  --   --   FREET4  --   --  1.05  T3FREE  --  1.0*  --    Anemia Panel: No results for input(s): VITAMINB12, FOLATE, FERRITIN, TIBC, IRON, RETICCTPCT in the last 72 hours. Sepsis Labs: Recent Labs  Lab 07/17/18 0050 07/17/18 0359 07/17/18 0704 07/17/18 1330  LATICACIDVEN  4.8* 1.3 2.2* 2.7*    Recent Results (from the past 240 hour(s))  SARS Coronavirus 2 (CEPHEID - Performed in [place] hospital lab), Hosp Order     Status: None   Collection Time: 07/16/18 11:33 PM   Specimen: Nasopharyngeal Swab  Result Value Ref Range Status   SARS Coronavirus 2 NEGATIVE NEGATIVE Final    Comment: (NOTE) If result is NEGATIVE SARS-CoV-2 target nucleic acids are NOT DETECTED. The SARS-CoV-2 RNA is generally detectable in upper and lower  respiratory specimens during the acute phase of infection. The lowest  concentration of SARS-CoV-2 viral copies this assay can detect is 250  copies / mL. A negative result does not preclude SARS-CoV-2 infection  and should not be used as the sole basis for treatment or other  patient management decisions.  A negative result may occur with  improper specimen collection / handling, submission of specimen other  than nasopharyngeal swab, presence of viral mutation(s) within the  areas targeted by this assay, and inadequate number of viral copies  (<250 copies / mL). A negative result must be combined with clinical  observations, patient history, and epidemiological information. If result is POSITIVE SARS-CoV-2 target nucleic acids are DETECTED. The SARS-CoV-2 RNA is generally detectable in upper and lower  respiratory specimens dur ing the acute phase of infection.  Positive  results are indicative of active infection with SARS-CoV-2.  Clinical  correlation with patient history and other diagnostic information is  necessary to determine patient infection status.  Positive results do  not rule out bacterial infection or co-infection with other viruses. If result is PRESUMPTIVE POSTIVE SARS-CoV-2 nucleic acids MAY BE PRESENT.   A presumptive positive result was obtained on the submitted specimen  and confirmed on repeat testing.  While 2019 novel coronavirus  (SARS-CoV-2) nucleic acids may be present in the submitted sample   additional confirmatory testing may be necessary for epidemiological  and / or clinical management purposes  to differentiate between  SARS-CoV-2 and other Sarbecovirus currently known to infect humans.  If clinically indicated additional testing with an alternate test  methodology [id number]) is advised. The SARS-CoV-2 RNA is generally  detectable in upper and lower respiratory sp ecimens during the acute  phase of infection. The expected result is Negative. Fact Sheet for Patients:  StrictlyIdeas.no Fact Sheet for Healthcare Providers: BankingDealers.co.za This test is not yet approved or cleared by the [place] FDA and has been authorized for detection and/or diagnosis of SARS-CoV-2 by FDA under an Emergency Use Authorization (EUA).  This EUA will remain in effect (meaning this test can be used) for the duration of the COVID-19 declaration under Section 564(b)(1) of the Act, 21 U.S.C. section 360bbb-3(b)(1), unless the authorization is terminated or revoked sooner. Performed at [facility]  [address] [place], [place] [address]   MRSA PCR Screening     Status: None   Collection Time: 07/17/18  4:27 AM   Specimen: Nasopharyngeal  Result Value Ref Range Status   MRSA by PCR NEGATIVE NEGATIVE Final    Comment:        The GeneXpert MRSA Assay (FDA approved for NASAL specimens only), is one component of a comprehensive MRSA colonization surveillance program. It is not intended to diagnose MRSA infection nor to guide or monitor treatment for MRSA infections. Performed at [facility], [place] [address], [place], [place] [address]   Culture, respiratory (tracheal aspirate)     Status: None   Collection Time: 07/17/18  4:45 AM   Specimen: Tracheal Aspirate; Respiratory  Result Value Ref Range Status   Specimen Description TRACHEAL ASPIRATE  Final   Special Requests NONE  Final   Gram Stain   Final    RARE WBC  PRESENT,BOTH PMN AND MONONUCLEAR NO ORGANISMS SEEN    Culture   Final    RARE Consistent with normal respiratory flora. Performed at [facility], [place] [address], [place], [place] [address]    Report Status 07/19/2018 FINAL  Final  Culture, blood (routine x 2)     Status: None (Preliminary result)   Collection Time: 07/17/18  6:55 AM   Specimen: BLOOD  Result Value Ref Range Status   Specimen Description BLOOD RIGHT ANTECUBITAL  Final   Special Requests   Final    BOTTLES DRAWN AEROBIC ONLY Blood Culture results may not be optimal due to an inadequate volume of blood received in culture bottles   Culture   Final    NO GROWTH 2 DAYS Performed at [facility], [place] [address] [place], [place] [address]    Report Status PENDING  Incomplete  Culture, blood (routine x 2)     Status: None (Preliminary result)   Collection Time: 07/17/18  7:00 AM   Specimen: BLOOD  Result Value Ref Range Status   Specimen Description BLOOD LEFT ANTECUBITAL  Final   Special Requests   Final    BOTTLES DRAWN AEROBIC ONLY Blood Culture results may not be optimal due to an inadequate volume of blood received in culture bottles   Culture   Final    NO GROWTH 2 DAYS Performed at [facility], [place] [address] [place], [place] [address]    Report Status PENDING  Incomplete      Radiology Studies: Dg Chest 1 View  Result Date: 07/18/2018 CLINICAL DATA:  Respiratory failure. EXAM: CHEST  1 VIEW COMPARISON:  Multiple previous chest x-rays. The most recent is 07/16/2018 FINDINGS: The endotracheal tube is 4.8 cm above the carina. External pacer paddles are noted. The heart is enlarged but stable. The lungs demonstrate much improved aeration with resolving edema and atelectasis. Persistent small effusions. IMPRESSION: 1. ET tube in good position. 2. Much improved lung aeration with resolving edema and atelectasis. Persistent small effusions. Electronically Signed   By: Marijo Sanes M.D.   On:  07/18/2018 11:51        Scheduled Meds: . arformoterol  15 mcg Nebulization BID  . budesonide (PULMICORT) nebulizer solution  0.5 mg Nebulization BID  . chlorhexidine  15 mL Mouth Rinse BID  . Chlorhexidine Gluconate Cloth  6 each Topical Daily  . feeding supplement  1 Container Oral TID BM  . feeding supplement (PRO-STAT SUGAR FREE 64)  30 mL Oral BID  .  levothyroxine  62.5 mcg Intravenous Daily  . mouth rinse  15 mL Mouth Rinse q12n4p  . multivitamin with minerals  1 tablet Oral Daily  . pantoprazole (PROTONIX) IV  40 mg Intravenous Q12H   Continuous Infusions: . ampicillin-sulbactam (UNASYN) IV Stopped (07/19/18 1248)  . lactated ringers 50 mL/hr at 07/19/18 1300    Assessment & Plan:    1.Upper GI bleed: Seen by GI and plan for endoscopy as inpatient.  Currently awaiting washout of Brilinta.  Denies NSAID use.  Recent EGD in May was unremarkable.?  Mallory-Weiss tear.  Hemoglobin stable around 9.5-10.  Monitor and transfuse as needed.  Continue IV PPI twice daily and clear liquid diet.  2.  Cardiac arrest: In the setting of problem #1.  Recent echo in June showed preserved EF .  Pain medications PRN for CPR related musculoskeletal pain.  Avoid NSAIDs  3. Acute on chronic respiratory failure: Secondary to problem #2 and possibly aspiration.  Now extubated.  Saturating well on 3 L nasal cannula.  IV Unasyn, neb treatments. S/p extubation. Throat spray prn  4.  COPD/pneumonia: Continue Unasyn to cover aspiration.  Will need follow-up imaging/CT to ensure resolution of retrocardiac infiltrate.  5.  Factor V Leyden deficiency/PE: Resume anticoagulation when okay per GI  6.  Hypothyroidism: Synthroid    DVT prophylaxis: SCDs Code Status: Full code Family / Patient Communication: Discussed with patient, family not present bedside. Disposition Plan: Home when medically cleared     LOS: 2 days    Time spent: 35 minutes    Gu[place]ford Shi, MD [facility]  Pager [phone]  If 7PM-7AM, please contact night-coverage [email or web address] Password [email or web address] 07/19/2018, 1:25 PM

## 2018-07-19 NOTE — Progress Notes (Signed)
Pt arrived to [place]. Skin check attempted, but RN was unable to complete skin assessment due to patient being in extreme pain.Skin assessment deferred until later tonight. RN to continue to monitor.

## 2018-07-19 NOTE — Progress Notes (Signed)
 Nutrition Follow-up  DOCUMENTATION CODES:   Non-severe (moderate) malnutrition in context of chronic illness  INTERVENTION:   Boost Breeze po TID, each supplement provides 250 kcal and 9 grams of protein, while on CL diet. Change to Ensure Enlive once diet advanced  30 ml Prostat BID, each supplement provides 100 kcals and 15 grams protein.   Add MVI with Minerals  NUTRITION DIAGNOSIS:   Moderate Malnutrition related to chronic illness(COPD) as evidenced by mild fat depletion, mild muscle depletion, moderate muscle depletion, percent weight loss(16.9% weight loss in 5 months).  Being addressed as diet advanced post extubation, supplements  GOAL:   Patient will meet greater than or equal to 90% of their needs  Progressing  MONITOR:   Diet advancement, PO intake, Supplement acceptance, Labs, Weight trends, Skin  REASON FOR ASSESSMENT:   Ventilator    ASSESSMENT:   68 year old female who presented to the ED on 7/07 with large volume hematemesis. Pt became unresponsive in PEA arrest and CPR initiated. ROSC was achieved. Pt required intubation in the ED. PMH of MI, factor V Leiden, COPD. TTM not pursued as pt was following commands post arrest.  7/09 Extubated  EGD on hold  Diet advanced to CL yesterday afternoon, no recorded po intake  Current wt 68.5 kg; weight 67.5 on 7/08. Net + 4 L. Plan to utilize 67.5 kg for estimating nutritional needs at present. Unsure of dry weight.  Non-pitting edema in B/L UE and LE.   Labs: reviewed Meds: LR at 50 ml/hr   Diet Order:   Diet Order            Diet clear liquid Room service appropriate? Yes; Fluid consistency: Thin  Diet effective now              EDUCATION NEEDS:   No education needs have been identified at this time  Skin:  Skin Assessment: Skin Integrity Issues: Skin Integrity Issues:: DTI DTI: buttocks  Last BM:  no documented BM  Height:   Ht Readings from Last 1 Encounters:  07/16/18 5\' 4"  (1.626  m)    Weight:   Wt Readings from Last 1 Encounters:  07/19/18 68.5 kg    Ideal Body Weight:  54.5 kg  BMI:  Body mass index is 25.92 kg/m.  Estimated Nutritional Needs:   Kcal:  1700-1900 kcals  Protein:  85-95  Fluid:  >/= 1.7 L    Tyauna Lacaze MS, RDN, LDN, CNSC [phone] Pager  [phone] Weekend/On-Call Pager

## 2018-07-20 ENCOUNTER — Inpatient Hospital Stay (HOSPITAL_COMMUNITY): Payer: Medicare Other

## 2018-07-20 DIAGNOSIS — J90 Pleural effusion, not elsewhere classified: Secondary | ICD-10-CM

## 2018-07-20 DIAGNOSIS — R0603 Acute respiratory distress: Secondary | ICD-10-CM

## 2018-07-20 LAB — POCT I-STAT 7, (LYTES, BLD GAS, ICA,H+H)
Acid-Base Excess: 4 mmol/L — ABNORMAL HIGH (ref 0.0–2.0)
Bicarbonate: 32.1 mmol/L — ABNORMAL HIGH (ref 20.0–28.0)
Calcium, Ion: 1.24 mmol/L (ref 1.15–1.40)
HCT: 32 % — ABNORMAL LOW (ref 36.0–46.0)
Hemoglobin: 10.9 g/dL — ABNORMAL LOW (ref 12.0–15.0)
O2 Saturation: 100 %
Potassium: 3.3 mmol/L — ABNORMAL LOW (ref 3.5–5.1)
Sodium: 139 mmol/L (ref 135–145)
TCO2: 34 mmol/L — ABNORMAL HIGH (ref 22–32)
pCO2 arterial: 66.5 mmHg (ref 32.0–48.0)
pH, Arterial: 7.292 — ABNORMAL LOW (ref 7.350–7.450)
pO2, Arterial: 383 mmHg — ABNORMAL HIGH (ref 83.0–108.0)

## 2018-07-20 LAB — BODY FLUID CELL COUNT WITH DIFFERENTIAL
Lymphs, Fluid: 24 %
Monocyte-Macrophage-Serous Fluid: 40 % — ABNORMAL LOW (ref 50–90)
Neutrophil Count, Fluid: 36 % — ABNORMAL HIGH (ref 0–25)
Total Nucleated Cell Count, Fluid: 100 cu mm (ref 0–1000)

## 2018-07-20 LAB — ALBUMIN, PLEURAL OR PERITONEAL FLUID: Albumin, Fluid: <1 g/dL

## 2018-07-20 LAB — BLOOD GAS, ARTERIAL
Acid-Base Excess: 5.7 mmol/L — ABNORMAL HIGH (ref 0.0–2.0)
Bicarbonate: 34 mmol/L — ABNORMAL HIGH (ref 20.0–28.0)
Drawn by: 237031
FIO2: 100
O2 Saturation: 99.1 %
Patient temperature: 98.6
pCO2 arterial: 98.3 mmHg (ref 32.0–48.0)
pH, Arterial: 7.164 — CL (ref 7.350–7.450)
pO2, Arterial: 247 mmHg — ABNORMAL HIGH (ref 83.0–108.0)

## 2018-07-20 LAB — BASIC METABOLIC PANEL
Anion gap: 9 (ref 5–15)
BUN: 9 mg/dL (ref 8–23)
CO2: 28 mmol/L (ref 22–32)
Calcium: 8.2 mg/dL — ABNORMAL LOW (ref 8.9–10.3)
Chloride: 103 mmol/L (ref 98–111)
Creatinine, Ser: 0.7 mg/dL (ref 0.44–1.00)
GFR calc Af Amer: >60 mL/min (ref >60)
GFR calc non Af Amer: >60 mL/min (ref >60)
Glucose, Bld: 93 mg/dL (ref 70–99)
Potassium: 3.8 mmol/L (ref 3.5–5.1)
Sodium: 140 mmol/L (ref 135–145)

## 2018-07-20 LAB — CBC
HCT: 37.8 % (ref 36.0–46.0)
Hemoglobin: 11.3 g/dL — ABNORMAL LOW (ref 12.0–15.0)
MCH: 30.1 pg (ref 26.0–34.0)
MCHC: 29.9 g/dL — ABNORMAL LOW (ref 30.0–36.0)
MCV: 100.8 fL — ABNORMAL HIGH (ref 80.0–100.0)
Platelets: 194 10*3/uL (ref 150–400)
RBC: 3.75 MIL/uL — ABNORMAL LOW (ref 3.87–5.11)
RDW: 14.7 % (ref 11.5–15.5)
WBC: 10.5 10*3/uL (ref 4.0–10.5)
nRBC: 0 % (ref 0.0–0.2)

## 2018-07-20 LAB — PROTEIN, TOTAL: Total Protein: 5 g/dL — ABNORMAL LOW (ref 6.5–8.1)

## 2018-07-20 LAB — TROPONIN I (HIGH SENSITIVITY)
Troponin I (High Sensitivity): 18 ng/L — ABNORMAL HIGH (ref <18)
Troponin I (High Sensitivity): 13 ng/L (ref <18)

## 2018-07-20 LAB — PROTEIN, PLEURAL OR PERITONEAL FLUID: Total protein, fluid: <6 g/dL

## 2018-07-20 LAB — LACTATE DEHYDROGENASE: LDH: 174 U/L (ref 98–192)

## 2018-07-20 LAB — GLUCOSE, PLEURAL OR PERITONEAL FLUID: Glucose, Fluid: 100 mg/dL

## 2018-07-20 LAB — HEMOGLOBIN AND HEMATOCRIT, BLOOD
HCT: 39.8 % (ref 36.0–46.0)
Hemoglobin: 12.2 g/dL (ref 12.0–15.0)

## 2018-07-20 LAB — GRAM STAIN

## 2018-07-20 LAB — ALBUMIN: Albumin: 2.1 g/dL — ABNORMAL LOW (ref 3.5–5.0)

## 2018-07-20 LAB — LACTATE DEHYDROGENASE, PLEURAL OR PERITONEAL FLUID: LD, Fluid: 86 U/L — ABNORMAL HIGH (ref 3–23)

## 2018-07-20 LAB — PROTIME-INR
INR: 1.2 (ref 0.8–1.2)
Prothrombin Time: 15.4 seconds — ABNORMAL HIGH (ref 11.4–15.2)

## 2018-07-20 LAB — BRAIN NATRIURETIC PEPTIDE: B Natriuretic Peptide: 565.2 pg/mL — ABNORMAL HIGH (ref 0.0–100.0)

## 2018-07-20 LAB — MAGNESIUM: Magnesium: 1.6 mg/dL — ABNORMAL LOW (ref 1.7–2.4)

## 2018-07-20 MED ORDER — PIPERACILLIN-TAZOBACTAM 3.375 G IVPB 30 MIN
3.3750 g | Freq: Once | INTRAVENOUS | Status: AC
  Administered 2018-07-20: 3.375 g via INTRAVENOUS
  Filled 2018-07-20 (×2): qty 50

## 2018-07-20 MED ORDER — SODIUM CHLORIDE 0.9 % IV BOLUS
1000.0000 mL | Freq: Once | INTRAVENOUS | Status: AC
  Administered 2018-07-20: 1000 mL via INTRAVENOUS

## 2018-07-20 MED ORDER — LACTATED RINGERS IV BOLUS
1000.0000 mL | Freq: Once | INTRAVENOUS | Status: AC
  Administered 2018-07-20: 1000 mL via INTRAVENOUS

## 2018-07-20 MED ORDER — POTASSIUM CHLORIDE 10 MEQ/100ML IV SOLN
10.0000 meq | INTRAVENOUS | Status: AC
  Administered 2018-07-20 (×4): 10 meq via INTRAVENOUS
  Filled 2018-07-20 (×4): qty 100

## 2018-07-20 MED ORDER — SODIUM CHLORIDE 0.9 % IV SOLN
INTRAVENOUS | Status: DC | PRN
  Administered 2018-07-21 – 2018-07-24 (×2): 500 mL via INTRAVENOUS
  Administered 2018-07-25 – 2018-07-29 (×2): 1000 mL via INTRAVENOUS

## 2018-07-20 MED ORDER — VANCOMYCIN HCL 10 G IV SOLR
1500.0000 mg | INTRAVENOUS | Status: DC
  Administered 2018-07-20 – 2018-07-21 (×2): 1500 mg via INTRAVENOUS
  Filled 2018-07-20 (×4): qty 1500

## 2018-07-20 MED ORDER — FENTANYL CITRATE (PF) 100 MCG/2ML IJ SOLN: 25.0000 ug | INTRAMUSCULAR | Status: DC | PRN

## 2018-07-20 MED ORDER — PIPERACILLIN-TAZOBACTAM 3.375 G IVPB
3.3750 g | Freq: Three times a day (TID) | INTRAVENOUS | Status: DC
  Administered 2018-07-20 – 2018-07-22 (×5): 3.375 g via INTRAVENOUS
  Filled 2018-07-20 (×5): qty 50

## 2018-07-20 MED ORDER — FENTANYL CITRATE (PF) 100 MCG/2ML IJ SOLN
25.0000 ug | INTRAMUSCULAR | Status: DC | PRN
  Administered 2018-07-22 (×2): 100 ug via INTRAVENOUS

## 2018-07-20 MED ORDER — MIDAZOLAM BOLUS VIA INFUSION
1.0000 mg | INTRAVENOUS | Status: DC | PRN
  Administered 2018-07-20: 1 mg via INTRAVENOUS
  Filled 2018-07-20: qty 2

## 2018-07-20 MED ORDER — EPINEPHRINE 1 MG/10ML IJ SOSY: 1.0000 | PREFILLED_SYRINGE | Freq: Once | INTRAMUSCULAR | Status: DC

## 2018-07-20 MED ORDER — LIDOCAINE HCL (PF) 1 % IJ SOLN
INTRAMUSCULAR | Status: AC
  Filled 2018-07-20: qty 30

## 2018-07-20 MED ORDER — FENTANYL CITRATE (PF) 100 MCG/2ML IJ SOLN: 25.0000 ug | Freq: Once | INTRAMUSCULAR | Status: DC

## 2018-07-20 MED ORDER — FENTANYL 2500MCG IN NS 250ML (10MCG/ML) PREMIX INFUSION
25.0000 ug/h | INTRAVENOUS | Status: DC
  Administered 2018-07-20: 25 ug/h via INTRAVENOUS
  Administered 2018-07-22: 100 ug/h via INTRAVENOUS
  Filled 2018-07-20 (×2): qty 250

## 2018-07-20 MED ORDER — MIDAZOLAM 50MG/50ML (1MG/ML) PREMIX INFUSION
0.0000 mg/h | INTRAVENOUS | Status: DC
  Filled 2018-07-20: qty 50

## 2018-07-20 MED ORDER — SODIUM CHLORIDE 3 % IN NEBU
4.0000 mL | INHALATION_SOLUTION | Freq: Two times a day (BID) | RESPIRATORY_TRACT | Status: AC
  Administered 2018-07-21 – 2018-07-22 (×4): 4 mL via RESPIRATORY_TRACT
  Filled 2018-07-20 (×6): qty 4

## 2018-07-20 MED ORDER — FENTANYL CITRATE (PF) 100 MCG/2ML IJ SOLN
INTRAMUSCULAR | Status: AC
  Filled 2018-07-20: qty 2

## 2018-07-20 MED ORDER — CHLORHEXIDINE GLUCONATE 0.12% ORAL RINSE (MEDLINE KIT)
15.0000 mL | Freq: Two times a day (BID) | OROMUCOSAL | Status: DC
  Administered 2018-07-20 – 2018-07-22 (×4): 15 mL via OROMUCOSAL

## 2018-07-20 MED ORDER — FENTANYL BOLUS VIA INFUSION
25.0000 ug | INTRAVENOUS | Status: DC | PRN
  Administered 2018-07-20: 25 ug via INTRAVENOUS
  Filled 2018-07-20: qty 25

## 2018-07-20 MED ORDER — MIDAZOLAM HCL 2 MG/2ML IJ SOLN
1.0000 mg | Freq: Once | INTRAMUSCULAR | Status: AC
  Administered 2018-07-20: 1 mg via INTRAVENOUS

## 2018-07-20 MED ORDER — ATROPINE SULFATE 1 MG/10ML IJ SOSY: 1.0000 mg | PREFILLED_SYRINGE | Freq: Once | INTRAMUSCULAR | Status: DC

## 2018-07-20 MED ORDER — CHLORHEXIDINE GLUCONATE 0.12% ORAL RINSE (MEDLINE KIT)
15.0000 mL | Freq: Two times a day (BID) | OROMUCOSAL | Status: DC
  Administered 2018-07-20: 15 mL via OROMUCOSAL

## 2018-07-20 MED ORDER — FENTANYL CITRATE (PF) 100 MCG/2ML IJ SOLN
50.0000 ug | Freq: Once | INTRAMUSCULAR | Status: AC
  Administered 2018-07-20: 50 ug via INTRAVENOUS

## 2018-07-20 MED ORDER — ETOMIDATE 2 MG/ML IV SOLN
20.0000 mg | Freq: Once | INTRAVENOUS | Status: AC
  Administered 2018-07-20: 16:00:00 20 mg via INTRAVENOUS

## 2018-07-20 MED ORDER — ORAL CARE MOUTH RINSE: 15.0000 mL | OROMUCOSAL | Status: DC

## 2018-07-20 MED ORDER — ROCURONIUM BROMIDE 50 MG/5ML IV SOLN
50.0000 mg | Freq: Once | INTRAVENOUS | Status: AC
  Administered 2018-07-20: 16:00:00 50 mg via INTRAVENOUS

## 2018-07-20 MED ORDER — MIDAZOLAM HCL 2 MG/2ML IJ SOLN
INTRAMUSCULAR | Status: AC
  Filled 2018-07-20: qty 2

## 2018-07-20 MED ORDER — ORAL CARE MOUTH RINSE
15.0000 mL | OROMUCOSAL | Status: DC
  Administered 2018-07-20 – 2018-07-22 (×15): 15 mL via OROMUCOSAL

## 2018-07-20 NOTE — Procedures (Signed)
PROCEDURE SUMMARY:  [place] left chest finds mostly consolidated inferior lung with trace effusion. More superiorly, there was more approachable simple appearing effusion  Successful US guided left thoracentesis. Yielded 320 mL of slightly hazy yellow fluid. Pt tolerated procedure well. No immediate complications.  Specimen was sent for labs. CXR ordered.  EBL < 5 mL  Ascencion Dike PA-C 07/20/2018 2:00 PM

## 2018-07-20 NOTE — Progress Notes (Signed)
Critical ABG results given to RN and called to Dr. Vaughan Browner. Verbal order received to increase RR to 24.

## 2018-07-20 NOTE — Progress Notes (Addendum)
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  She was intubated and subsequently transferred to ICU at [facility].  She was extubated on July 9 and transferred out of ICU.  She was started on Unasyn for possible aspiration pneumonia/retrocardiac infiltrate.  She did have recent EGD in May which was unremarkable.  GI consulted again in this admission however patient is scheduled to have EGD tomorrow on 07/21/2018 as tomorrow will be day 5 of being off of Brilinta.  I assumed care of patient this morning.  While I was on the floor, I was pulled by patient's nurse that patient is in respiratory distress.  When I saw patient, she was slightly dyspneic and on 4 L of oxygen (her baseline is 3 L of oxygen requirement at home).  Patient was having trouble completing full sentences.  She also complained of some chest pain. stat chest x-ray was ordered which showed complete opacification of the left lung.  Urgent thoracentesis was ordered and I consulted pulmonary critical care at 1:24 PM.   Subjective: Patient seen and examined.  She was in respiratory distress and unable to complete full sentences.  Complaint of chest pain.  Objective: Vitals:   07/19/18 2140 07/20/18 0518 07/20/18 0800 07/20/18 1259  BP: (!) 104/58 109/60  (!) 111/58  Pulse: 79 82  65  Resp: 16 16  16   Temp: (!) 97.4 F (36.3 C) 98 F (36.7 C)    TempSrc: Oral Oral    SpO2: 97% 98% 93% 100%  Weight:      Height:        Intake/Output Summary (Last 24 hours) at 07/20/2018 1324 Last data filed at 07/19/2018 1600 Gross per 24 hour  Intake 149.91 ml  Output 250  ml  Net -100.09 ml   Filed Weights   07/16/18 2326 07/17/18 0345 07/19/18 0500  Weight: 70.1 kg 67.5 kg 68.5 kg    Physical Examination:  General exam: Appears in respiratory distress Respiratory system: Dyspneic with poor inspiratory effort and diminished breath sounds on the left side with some crackles at the right and left base. Cardiovascular system: S1 & S2 heard, RRR.  Distended JVD, murmurs, rubs, gallops or clicks. No pedal edema. Gastrointestinal system: Abdomen is nondistended, soft and nontender. No organomegaly or masses felt. Normal bowel sounds heard. Central nervous system: Alert and oriented. No focal neurological deficits. Extremities: Symmetric 5 x 5 power. Skin: No rashes, lesions or ulcers  Data Reviewed: I have personally reviewed following labs and imaging studies  CBC: Recent Labs  Lab 07/17/18 0353 07/17/18 1544 07/18/18 0349 07/19/18 0318 07/19/18 0754 07/19/18 1420 07/19/18 2053 07/20/18 0808  WBC 26.0* 18.1* 10.1 7.6 8.2  --   --  10.5  NEUTROABS 23.5*  --   --   --  6.2  --   --   --   HGB 11.5* 10.7* 9.8* 9.6* 10.7* 10.6* 11.1* 11.3*  HCT 36.4 33.1* 30.9* 30.4* 34.0* 34.2* 36.7 37.8  MCV 97.1 94.3 95.1 96.5 96.9  --   --  100.8*  PLT 228 231 189 164 168  --   --  885   Basic Metabolic Panel: Recent Labs  Lab 07/16/18 2309  07/17/18 0353 07/19/18 0754 07/20/18 1059  NA 141 142 141 140  K 4.2 4.2 3.4* 3.8  CL 97* 102 106 103  CO2 37* 33* 26 28  GLUCOSE 200* 181* 99 93  BUN 15 16 9 9   CREATININE 0.86 0.88 0.70 0.70  CALCIUM 8.0* 7.1* 7.5* 8.2*  MG  --  1.6*  --   --    GFR: Estimated Creatinine Clearance: 64 mL/min (by C-G formula based on SCr of 0.7 mg/dL). Liver Function Tests: Recent Labs  Lab 07/16/18 2309 07/20/18 1154  AST 15  --   ALT 15  --   ALKPHOS 58  --   BILITOT 0.3  --   PROT 6.4* 5.0*  ALBUMIN 3.1* 2.1*   Recent Labs  Lab 07/16/18 2309  LIPASE 31   No results for input(s): AMMONIA in the last 168 hours.  Coagulation Profile: Recent Labs  Lab 07/16/18 2309 07/17/18 0353 07/18/18 0349 07/19/18 0318 07/20/18 0703  INR 1.4* 1.2 1.2 1.1 1.2   Cardiac Enzymes: No results for input(s): CKTOTAL, CKMB, CKMBINDEX, TROPONINI in the last 168 hours. BNP (last 3 results) No results for input(s): PROBNP in the last 8760 hours. HbA1C: No results for input(s): HGBA1C in the last 72 hours. CBG: Recent Labs  Lab 07/17/18 0340  GLUCAP 158*   Lipid Profile: No results for input(s): CHOL, HDL, LDLCALC, TRIG, CHOLHDL, LDLDIRECT in the last 72 hours. Thyroid Function Tests: Recent Labs    07/18/18 1300 07/18/18 1302  FREET4  --  1.05  T3FREE 1.0*  --    Anemia Panel: No results for input(s): VITAMINB12, FOLATE, FERRITIN, TIBC, IRON, RETICCTPCT in the last 72 hours. Sepsis Labs: Recent Labs  Lab 07/17/18 0050 07/17/18 0359 07/17/18 0704 07/17/18 1330  LATICACIDVEN 4.8* 1.3 2.2* 2.7*    Recent Results (from the past 240 hour(s))  SARS Coronavirus 2 (CEPHEID - Performed in [place] hospital lab), Hosp Order     Status: None   Collection Time: 07/16/18 11:33 PM   Specimen: Nasopharyngeal Swab  Result Value Ref Range Status   SARS Coronavirus 2 NEGATIVE NEGATIVE Final    Comment: (NOTE) If result is NEGATIVE SARS-CoV-2 target nucleic acids are NOT DETECTED. The SARS-CoV-2 RNA is generally detectable in upper and lower  respiratory specimens during the acute phase of infection. The lowest  concentration of SARS-CoV-2 viral copies this assay can detect is 250  copies / mL. A negative result does not preclude SARS-CoV-2 infection  and should not be used as the sole basis for treatment or other  patient management decisions.  A negative result may occur with  improper specimen collection / handling, submission of specimen other  than nasopharyngeal swab, presence of viral mutation(s) within the  areas targeted by this assay, and inadequate number of viral copies  (<250 copies / mL).  A negative result must be combined with clinical  observations, patient history, and epidemiological information. If result is POSITIVE SARS-CoV-2 target nucleic acids are DETECTED. The SARS-CoV-2 RNA is generally detectable in upper and lower  respiratory specimens dur ing the acute phase of infection.  Positive  results are indicative of active infection with SARS-CoV-2.  Clinical  correlation with patient history and other diagnostic information is  necessary to determine patient infection status.  Positive results do  not rule out bacterial infection or co-infection with other viruses. If result is PRESUMPTIVE POSTIVE SARS-CoV-2 nucleic acids MAY BE PRESENT.   A presumptive positive result was obtained on the submitted specimen  and confirmed on repeat testing.  While 2019 novel coronavirus  (SARS-CoV-2) nucleic acids may be present in the submitted sample  additional confirmatory testing may be necessary for epidemiological  and / or clinical management purposes  to differentiate between  SARS-CoV-2 and other Sarbecovirus currently known to infect humans.  If clinically indicated additional testing with an alternate test  methodology [phone]) is advised. The SARS-CoV-2 RNA is generally  detectable in upper and lower respiratory sp ecimens during the acute  phase of infection. The expected result is Negative. Fact Sheet for Patients:  StrictlyIdeas.no Fact Sheet for Healthcare Providers: BankingDealers.co.za This test is not yet approved or cleared by the [place] FDA and has been authorized for detection and/or diagnosis of SARS-CoV-2 by FDA under an Emergency Use Authorization (EUA).  This EUA will remain in effect (meaning this test can be used) for the duration of the COVID-19 declaration under Section 564(b)(1) of the Act, 21 U.S.C. section 360bbb-3(b)(1), unless the authorization is terminated or revoked sooner.  Performed at [facility], [address] [place], [place] [address]   MRSA PCR Screening     Status: None   Collection Time: 07/17/18  4:27 AM   Specimen: Nasopharyngeal  Result Value Ref Range Status   MRSA by PCR NEGATIVE NEGATIVE Final    Comment:        The GeneXpert MRSA Assay (FDA approved for NASAL specimens only), is one component of a comprehensive MRSA colonization surveillance program. It is not intended to diagnose MRSA infection nor to guide or monitor treatment for MRSA infections. Performed at [facility], [place] [address], [place], [place] [address]   Culture, respiratory (tracheal aspirate)     Status: None   Collection Time: 07/17/18  4:45 AM   Specimen: Tracheal Aspirate; Respiratory  Result Value Ref Range Status   Specimen Description TRACHEAL ASPIRATE  Final   Special Requests NONE  Final   Gram Stain   Final    RARE WBC PRESENT,BOTH PMN AND MONONUCLEAR NO ORGANISMS SEEN    Culture   Final    RARE Consistent with normal respiratory flora. Performed at [facility], [place] [address], [place], [place] [address]    Report Status 07/19/2018 FINAL  Final  Culture, blood (routine x 2)     Status: None (Preliminary result)   Collection Time: 07/17/18  6:55 AM   Specimen: BLOOD  Result Value Ref Range Status   Specimen Description BLOOD RIGHT ANTECUBITAL  Final   Special Requests   Final    BOTTLES DRAWN AEROBIC ONLY Blood Culture results may not be optimal due to an inadequate volume of blood received in culture bottles   Culture   Final    NO GROWTH 2 DAYS Performed at [facility], [place] [address], [place], [place] [address]    Report Status PENDING  Incomplete  Culture, blood (routine x 2)     Status: None (Preliminary result)   Collection Time: 07/17/18  7:00 AM   Specimen: BLOOD  Result Value Ref Range Status   Specimen Description BLOOD LEFT ANTECUBITAL  Final   Special Requests   Final    BOTTLES DRAWN AEROBIC ONLY Blood  Culture results may not be optimal due to an inadequate volume of blood received in culture bottles   Culture   Final    NO GROWTH 2 DAYS Performed at [facility], [place] [address], [place],  [address]    Report Status PENDING  Incomplete      Radiology Studies: Dg Chest Port 1 View  Result Date: 07/20/2018 CLINICAL DATA:  Respiratory distress. EXAM: PORTABLE CHEST 1 VIEW COMPARISON:  07/18/2018 FINDINGS: Internal extubation. The cardiac and mediastinal silhouettes are obscured by complete white out of the left lung. Slight shift of the mediastinal structures to the left. Small right pleural effusion. Possible mild pulmonary edema of the right lung. IMPRESSION: 1. Complete opacification of the left lung with mild leftward mediastinal shift. This may be due to complete collapse of the left lung and/or layering left pleural effusion/left hemothorax. Please correlate clinically. 2. Small right pleural effusion. 3. Possible mild pulmonary edema of the right lung. These results will be called to the ordering clinician or representative by the Radiologist Assistant, and communication documented in the PACS or zVision Dashboard. Electronically Signed   By: Fidela Salisbury M.D.   On: 07/20/2018 11:41        Scheduled Meds: . arformoterol  15 mcg Nebulization BID  . budesonide (PULMICORT) nebulizer solution  0.5 mg Nebulization BID  . chlorhexidine  15 mL Mouth Rinse BID  . Chlorhexidine Gluconate Cloth  6 each Topical Daily  . feeding supplement  1 Container Oral TID BM  . feeding supplement (PRO-STAT SUGAR FREE 64)  30 mL Oral BID  . levothyroxine  62.5 mcg Intravenous Daily  . lidocaine (PF)      . mouth rinse  15 mL Mouth Rinse q12n4p  . multivitamin with minerals  1 tablet Oral Daily  . pantoprazole (PROTONIX) IV  40 mg Intravenous Q12H   Continuous Infusions: . ampicillin-sulbactam (UNASYN) IV 3 g (07/20/18 0650)  . lactated ringers 50 mL/hr at 07/19/18 1600     Assessment & Plan:    1.Upper GI bleed: Seen by GI and plan for endoscopy as inpatient tomorrow on 07/21/2018.  Currently awaiting washout of Brilinta.  Denies NSAID use.  Recent EGD in May was unremarkable.?  Mallory-Weiss tear.  Hemoglobin stable around 9.5-10.  Monitor and transfuse as needed.  Continue IV PPI twice daily and clear liquid diet.  2.  Cardiac arrest: In the setting of problem #1.  Recent echo in June showed preserved EF .  Pain medications PRN for CPR related musculoskeletal pain.  Avoid NSAIDs  3. Acute on chronic respiratory failure: Secondary to problem #2 and possibly aspiration.  Now extubated.  However patient started going back into respiratory distress this morning when I was pulled into her room by her nurse.  Stat chest x-ray was done which showed opacification of the left lung possibly due to collapse of the lung and pleural effusion.  Stat thoracentesis was ordered.  I have consulted pulmonary critical care at 1:25 PM today.  4.  COPD/pneumonia: Continue Unasyn to cover aspiration.  Will need follow-up imaging/CT to ensure resolution of retrocardiac infiltrate.  5.  Factor V Leyden deficiency/PE: Resume anticoagulation when okay per GI  6.  Hypothyroidism: Synthroid   DVT prophylaxis: SCDs Code Status: Full code Family / Patient Communication: family not present bedside. Disposition Plan: To be determined.   LOS: 3 days    Time spent: 43 minutes    Darliss Cheney, MD [facility] Pager [phone]  If 7PM-7AM, please contact night-coverage [email or web address] Password [facility] 07/20/2018, 1:24 PM   UPDATE: Received a call from PCCM that patient is going to be transferred to ICU and may require intubation or bronchoscopy.  Patient is being transferred to ICU care now.

## 2018-07-20 NOTE — Progress Notes (Addendum)
NAME:  [name], MRN:  [id number], DOB:  [birth year], LOS: 3 ADMISSION DATE:  07/16/2018, CONSULTATION DATE:  7/8 REFERRING MD: Dr. Sedonia Small EDP, CHIEF COMPLAINT:  Hematemesis    Brief History   68 year old with Factor V Leiden on warfarin presented with hematemesis. Coded in [facility] ED for 12 minutes. Coagulopathy reversed with Vit K, [place] Transferred to [facility].   Past Medical History   has a past medical history of Acute MI ([place]), Arteriosclerotic cardiovascular disease (ASCVD) (2002), Chronic anticoagulation, Chronic respiratory failure ([place]) (08/27/2013), COPD (chronic obstructive pulmonary disease) ([place]), COPD (chronic obstructive pulmonary disease) ([place]), Factor 5 Leiden mutation, heterozygous ([place]), Factor V Leiden, prothrombin gene mutation ([place]) (2006), Hyperlipidemia, Hypothyroidism, Noncompliance, Pelvic fracture ([place]) (2009), Pulmonary embolism ([place]) (2006), and Tobacco abuse.  Significant Hospital Events   7/7 present with hematemesis, cardiac arrest 12 mins PEA. [facility] Started on PPI gtt. Received blood. GI consulted. Wanted to have pt off brillinta and warfarin for 5d. abx started for aspiration 7/9 extubated  7/10: to IM service, vomited again on 7/10 on arrival to [place] 7/11 more short of breath, sats 0-80s on 2 liters [place] cxr now w/ completely opacified left hemithorax. IM service ordered IR consult for thora and PCCM asked to see again for worsening hypoxia, atelectasis/mucous plugging and possible pleural effusion.   Consults:  7/8 Gastroenterology  Procedures:  ETT 7/7 >7/9 CVL 7/8 >  Significant Diagnostic Tests:  Endoscopy 05/28/18 > Normal esophagus, normal stomach  Micro Data:  Blood 7/8 > Sputum 7/8 > NGTD Pleural fluid 7/11>>>  Antimicrobials:  Unasyn 7/8 >>7/11 vanc 7/11>>> Zosyn 7/11>>>  Interim history/subjective:  Still short of breath not better after thoracentesis.  Complains of discomfort with mild palpation of the chest  Objective   Blood pressure (Abnormal) 111/58, pulse 65, temperature 98 F (36.7 C), temperature source Oral, resp. rate 16, height 5\' 4"  (1.626 m), weight 68.5 kg, SpO2 100 %.        Intake/Output Summary (Last 24 hours) at 07/20/2018 1329 Last data filed at 07/19/2018 1600 Gross per 24 hour  Intake 149.91 ml  Output 250 ml  Net -100.09 ml   Filed Weights   07/16/18 2326 07/17/18 0345 07/19/18 0500  Weight: 70.1 kg 67.5 kg 68.5 kg    Examination:  General: This is acute on chronically appearing 68 year old female currently with labored respiratory effort HEENT normocephalic atraumatic mucous membranes are moist no significant neck vein distention but does exhibit accessory muscle use particularly in the neck Pulmonary: Coarse diffuse rhonchi, bronchial breath sounds appreciated on the left with decreased airflow, accessory muscle use as mentioned above Cardiac: Regular rate and rhythm Abdomen: Soft not tender Extremities: Diffuse edema/anasarca brisk capillary refill Neuro: Awake, oriented, moves all extremities. GU: Voids  Resolved Hospital Problem list   PEA arrest 22/ hemorrhagic shock  Hematemesis Lactic acidosis    Assessment & Plan:   Acute on chronic hypoxic respiratory failure in setting of what appears to be aspiration PNA w/ progressive mucous plugging/atx w/ associated moderate sized left pleural effusion and now total opacification of the left hemithorax -[place] personally reviewed shows moderate sized left effusion. This could be also a sympathetic collection as a result of mucous plugging vs parapneumonic or other inflammatory process involving the pleural space; almost certainly does not explain her hypoxia-->that is due to atx/plugging.  -Now s/p left thoracentesis 7/11 w/ 320 ml of hazy yellow colored pleural fluid removed.  F/u CXR personally reviewed:  W/ still  significant consolidation of the left hemithorax w/ only slight improvement in aeration.  Plan F/u  pleural fluid studies  Cont supplemental oxygen Add HT saline neb  And give nebulized rx via meta neb Add flutter and cont IS Day # 5 unasyn,  no sig fever or WBC spike BUT significant radiographic and clinical decline so will change to nosocomial coverage Repeat cxr in am We will move her to the intensive care, she currently has nausea this may make the above pulmonary hygiene interventions very difficult, if she continues to have nausea or does not improve with above interventions she will require intubation, this may be required anyway to safely perform endoscopy procedure.  I also suspect post CPR related chest discomfort is contributing to her atelectatic changes  H/o severe COPD Plan Cont Cont scheduled brovana and Pulmicort  Presumed UGIB Hematemesis  Superimposed on h/o Factor V Leiden & prior Pulmonary emboli; on chronic AC. Also has chronic Normocytic anemia -now s/p 5 days off brilianta  and coumadin  hgb stable  Plan Cont PPI BID EGD on hold for now  CAD Plan Holding ac  Hypothyroid Plan Cont synthroid   Best practice:  Diet: NPO Pain/Anxiety/Delirium protocol (if indicated): not indicated VAP protocol (if indicated):na DVT prophylaxis: SCDs GI prophylaxis: PPI gtt Glucose control: NA Mobility: BR Code Status: FULL Family Communication: I spoke w/ daughter Unknown Foley on 7/11. I informed her about her clinical decline. Updated her on plan of care as outlined above. This included the possibility of Ms Martinezgarcia requiring intubation. Her reaction was difficult to interpret over the phone but when I told her that the next time I called her it may be to tell her she was either emergently intubated or will need this. She said "OK" and indicated she was in agreement to proceed w/ care as outlined above. I asked her that she keep her cell phone ready and available as we will need to reach out to her again in the near future if not emergently later today     Disposition: to ICU may need intubation/bronch   My ccm 43 min  Erick Colace ACNP-BC [place] Pager # [phone] OR # [phone] if no answer  --------------------------------------------------------------------------------------------------  Attending note: I have seen and examined the patient. History, labs and imaging reviewed.  Mrs. Milbrath is known to the CCM service.  Admitted earlier this month for GI bleed in the setting of anticoagulation, factor V Leyden deficiency.  She coded on admission at the ED Successfully extubated and transferred out of the ICU PCCM called back on 7/11 for acute respiratory failure, left lung opacification Underwent thoracentesis by IR which did not reexpand the lung  Blood pressure (!) 114/39, pulse 65, temperature 98 F (36.7 C), temperature source Oral, resp. rate 16, height 5\' 4"  (1.626 m), weight 68.5 kg, SpO2 100 %. Gen:      Moderate distress HEENT:  EOMI, sclera anicteric Neck:     No masses; no thyromegaly Lungs:    Tachypnea CV:         Regular rate and rhythm; no murmurs Abd:      + bowel sounds; soft, non-tender; no palpable masses, no distension Ext:    No edema; adequate peripheral perfusion Skin:      Warm and dry; no rash Neuro: alert and oriented x 3  Labs reviewed, significant for ABG 7.16/98/247/99% Sodium 140, potassium 3.8, BUN/creatinine 9/0.7 WBC 10.5, hemoglobin 11.3, platelets 194  Imaging Chest x-ray 7/11-complete opacification of  the left hemithorax.  Reviewed images personally.  Assessment/plan: Acute respiratory failure, left lung collapse Suspect mucous plugging as opacification has not improved with thoracentesis She has worsening respiratory status with hypercarbia.  We will plan for intubation and bronchoscope Broaden antibiotic coverage Continue bronchodilators  GI bleed Plan for EGD tomorrow.  Can be done if she is stable on the ventilator.  The patient is critically ill with  multiple organ systems failure and requires high complexity decision making for assessment and support, frequent evaluation and titration of therapies, application of advanced monitoring technologies and extensive interpretation of multiple databases.  Critical care time - 35 mins. This represents my time independent of the NPs time taking care of the pt.  Marshell Garfinkel MD [facility] 07/20/2018, 5:16 PM

## 2018-07-20 NOTE — Progress Notes (Signed)
Patient's BP low after starting Fentanyl & Versed gtts. Order received for 1L bolus of LR. Patient's HR decreased to 37, BP 50's, and face became dusky, grey in color. Dr. Vaughan Browner at bedside, 1 mg of Atropine & 1 mg of Epi given. [place], Ardeth Sportsman

## 2018-07-20 NOTE — Progress Notes (Signed)
Dr. Doristine Bosworth  Updated regarding pt's c/o of SOB, O2 saturation  In the 70's to low 80's at 2 L   O2 raised to 4 L with resulting saturation in the  High 80's to low 90's. Lung sounds from clear /diminished to coarse crackle with noted rattling. BP 110/80. Pt appears pale  And had c/o also of worsening coughing episodes per pt. Rapid response updated  Also with situation. Last CXray dated 07/18/2018. New orders noted.

## 2018-07-20 NOTE — Progress Notes (Signed)
Pharmacy Antibiotic Note  [name] is a 68 y.o. female admitted on 07/16/2018 with pneumonia.  Pharmacy has been consulted for Vancomycin and Zosyn dosing.  Switching from Unasyn to Vanc/Zosyn for concerning new hypoxia and s/p thoracentesis w/ removal of hazy yellow colored pleural fluid. Renal function stable. WBC 10.5, downtrending since admission. Afebrile.  Plan: Zosyn 3.375g IV q8h (4 hour infusion) Vancomycin 1500 IV every 24 hours Discontinue Unasyn Monitor VP and VT, cultures, renal function, clinical signs of infx  Calculated AUC 510.9 Scr used 0.7  Height: 5\' 4"  (162.6 cm) Weight: 151 lb 0.2 oz (68.5 kg) IBW/kg (Calculated) : 54.7  Temp (24hrs), Avg:97.6 F (36.4 C), Min:97.3 F (36.3 C), Max:98 F (36.7 C)  Recent Labs  Lab 07/16/18 2309 07/17/18 0050 07/17/18 0353 07/17/18 0359 07/17/18 0704 07/17/18 1330 07/17/18 1544 07/18/18 0349 07/19/18 0318 07/19/18 0754 07/20/18 0808 07/20/18 1059  WBC 9.7  --  26.0*  --   --   --  18.1* 10.1 7.6 8.2 10.5  --   CREATININE 0.86  --  0.88  --   --   --   --   --   --  0.70  --  0.70  LATICACIDVEN  --  4.8*  --  1.3 2.2* 2.7*  --   --   --   --   --   --     Estimated Creatinine Clearance: 64 mL/min (by C-G formula based on SCr of 0.7 mg/dL).    Allergies  Allergen Reactions  . Dilaudid [Hydromorphone Hcl] Hives and Nausea Only  . Minocycline Hcl     REACTION: Dizzy  . Prednisone     REACTION: feels like throat swelling, hallucinations  . Varenicline Tartrate     REACTION: Dizzy(chantix)   . Zocor [Simvastatin - High Dose] Other (See Comments)    myalgia    Antimicrobials this admission: Unasyn 7/8 >> 7/11 Vancomycin 7/11 >>  Zosyn 7/11 >>  Microbiology results: 7/8 BCx: ng2d 7/8 Sputum: rare WBC  7/8 MRSA PCR: neg 7/7 COVID: neg  Thank you for allowing pharmacy to be a part of this patient's care.  Richardine Service, PharmD PGY1 Pharmacy Resident Direct Line: [phone]  07/20/2018 2:59  PM

## 2018-07-20 NOTE — Procedures (Signed)
Bedside Bronchoscopy Procedure Note [name] [id number] 1950/09/07  Procedure: Bronchoscopy Indications: Diagnostic evaluation of the airways, Obtain specimens for culture and/or other diagnostic studies and Remove secretions  Procedure Details: ET Tube Size:7.5 ET Tube secured at lip (cm): Bite block in place: No In preparation for procedure, Patient hyper-oxygenated with 100 % FiO2 and Saline given via ETT (60 ml) Airway entered and the following bronchi were examined: RUL, RML, RLL, LUL, LLL and Bronchi.   Bronchoscope removed.  , Patient placed back on 100% FiO2 at conclusion of procedure.    Evaluation BP (!) 114/39 (BP Location: Left Arm)   Pulse 65   Temp 98 F (36.7 C) (Oral)   Resp 16   Ht 5\' 4"  (1.626 m)   Wt 68.5 kg   SpO2 100%   BMI 25.92 kg/m  Breath Sounds:Diminished O2 sats: stable throughout Patient's Current Condition: stable Complications: No apparent complications Patient did tolerate procedure well.   Ciro Backer 07/20/2018, 5:14 PM

## 2018-07-20 NOTE — Procedures (Signed)
Bronchoscopy Procedure Note [name] [id number] 25-Jul-1950  Procedure: Bronchoscopy Indications: Diagnostic evaluation of the airways and Obtain specimens for culture and/or other diagnostic studies  Procedure Details Consent: Risks of procedure as well as the alternatives and risks of each were explained to the (patient/caregiver).  Consent for procedure obtained. Time Out: Verified patient identification, verified procedure, site/side was marked, verified correct patient position, special equipment/implants available, medications/allergies/relevent history reviewed, required imaging and test results available.  Performed  In preparation for procedure, patient was given 100% FiO2 and bronchoscope lubricated. Sedation: Benzodiazepines  Airway entered and the following bronchi were examined: RUL, RML, RLL, LUL, LLL and Bronchi.   The whole left mainstem bronchus was occluded by mucous plug and thick secretions which extended all the way into the lower lobe.  This was suctioned out with serial aliquots of bronchial washing.  BAL performed in the left lower lobe  Right lower lobe was then explored.  Unexpectedly we found a polypoid lesion in the bronchus intermedius of the right lower lobe which was unable to be suctioned out (PIctures below).  This will need further evaluation with repeat bronchoscope when we can obtain the tools for brushings and biopsy.  Procedures performed: BAL Left lower lobe  Bronchus Intermedius    Evaluation Hemodynamic Status: BP stable throughout; O2 sats: stable throughout Patient's Current Condition: stable Specimens:  Sent purulent fluid Complications: No apparent complications Patient did tolerate procedure well.   [name] 07/20/2018

## 2018-07-20 NOTE — Progress Notes (Addendum)
[facility] Progress Note Patient Name: Marleigh Kaylor DOB: 1950-06-18 MRN: [id number]   Date of Service  07/20/2018  HPI/Events of Note  Notified by RN that K 3.3.  PT was intubated earlier and hypotensive with sedatives.  Pt was given 1L NS bolus earlier.  SBP remains in the 80s.  eICU Interventions  KCl 64meq IV ordered.  Give 1L NS bolus.      Intervention Category Intermediate Interventions: Hypotension - evaluation and management;Electrolyte abnormality - evaluation and management  Elsie Lincoln 07/20/2018, 7:40 PM   8:51 PM EiCU RN informed of PVCs on telemetry.   Plan> Add on Mg level to last blood draw.  Replete as needed.  9:54 PM BP improved while the patient was getting IVFs. Now back in the 80s.   Plan> Give 1L LR bolus.  Titrate fentanyl gtt down.  If after 3rd liter, pt remains hypotensive, will consider starting on pressor.  12:06 AM Mg noted to be 1.6.  Plan> Give MgSO4 1g IV.  1:28 AM Pt is responding to IVF boluses. PLlan> Give LR bolus.  Start levophed after this last liter if she remains hypotensive.  4:59 AM SBP in the 70s-80s again.    Plan> Check lactate.  Start on levophed peripherally while waiting for central line placement.

## 2018-07-20 NOTE — Progress Notes (Signed)
[facility] Progress Note    Since last GI note: She is in respiratory distress this afternoon and a CXR shows whited out left chest.  IR is planning a thoracetesis or chest tube soon.  She's had NO overt bleeding; no dark stools, no hematemesis  Objective: Vital signs in last 24 hours: Temp:  [97.3 F (36.3 C)-98 F (36.7 C)] 98 F (36.7 C) (07/11 0518) Pulse Rate:  [65-95] 65 (07/11 1259) Resp:  [15-22] 16 (07/11 1259) BP: (104-155)/(45-60) 111/58 (07/11 1259) SpO2:  [93 %-100 %] 100 % (07/11 1259) Last BM Date: (UTA/PTA) General: uncomfortable, slightly tachypnic Heart: regular rate and rythm Abdomen: soft, non-tender, non-distended, normal bowel sounds   Lab Results: Recent Labs    07/19/18 0318 07/19/18 0754 07/19/18 1420 07/19/18 2053 07/20/18 0808  WBC 7.6 8.2  --   --  10.5  HGB 9.6* 10.7* 10.6* 11.1* 11.3*  PLT 164 168  --   --  194  MCV 96.5 96.9  --   --  100.8*   Recent Labs    07/19/18 0754 07/20/18 1059  NA 141 140  K 3.4* 3.8  CL 106 103  CO2 26 28  GLUCOSE 99 93  BUN 9 9  CREATININE 0.70 0.70  CALCIUM 7.5* 8.2*   Recent Labs    07/20/18 1154  PROT 5.0*  ALBUMIN 2.1*   Recent Labs    07/19/18 0318 07/20/18 0703  INR 1.1 1.2    Medications: Scheduled Meds: . arformoterol  15 mcg Nebulization BID  . budesonide (PULMICORT) nebulizer solution  0.5 mg Nebulization BID  . chlorhexidine  15 mL Mouth Rinse BID  . Chlorhexidine Gluconate Cloth  6 each Topical Daily  . feeding supplement  1 Container Oral TID BM  . feeding supplement (PRO-STAT SUGAR FREE 64)  30 mL Oral BID  . levothyroxine  62.5 mcg Intravenous Daily  . lidocaine (PF)      . mouth rinse  15 mL Mouth Rinse q12n4p  . multivitamin with minerals  1 tablet Oral Daily  . pantoprazole (PROTONIX) IV  40 mg Intravenous Q12H   Continuous Infusions: . ampicillin-sulbactam (UNASYN) IV 3 g (07/20/18 0650)  . lactated ringers 50 mL/hr at 07/19/18 1600   PRN  Meds:.acetaminophen, HYDROcodone-acetaminophen, morphine injection, ondansetron (ZOFRAN) IV    Assessment/Plan: 68 y.o. female who presented initially from outside hosp with hematemesis, PEA arrest.     She had EGD and colonoscoy 73months ago at [facility].  I was planning to repeat the EGD tomorrow which would have been 5 days off of coumadin and brilinta to reevaluate her UGI tract.  She is currently in resp distress and has whited out left chest.  Floor team planning thoracentesis now.  Will check back on Monday to see how she is doing, reconsider the EGD that I was planning for tomorrow. Clearly EGD is not safe, needs to be stablized first.  Fortunately no overt bleeding and her Hb has remained around 11 since admission.   [name] Banister, MD  07/20/2018, 1:00 PM [facility] Pager [phone]

## 2018-07-20 NOTE — Progress Notes (Signed)
Pharmacy Antibiotic Note  [name] is a 68 y.o. female admitted on 07/16/2018 with PEA arrest now concerned with aspiration pneumonia.  Pharmacy has been consulted for Unasyn dosing, CrCl 57.8>64, improving. Afebrile, WBC improving.  Plan: Unasyn 3g Q6H Trend WBC, Temp, renal fxn  Height: 5\' 4"  (162.6 cm) Weight: 151 lb 0.2 oz (68.5 kg) IBW/kg (Calculated) : 54.7  Temp (24hrs), Avg:97.6 F (36.4 C), Min:97.3 F (36.3 C), Max:98 F (36.7 C)  Recent Labs  Lab 07/16/18 2309 07/17/18 0050 07/17/18 0353 07/17/18 0359 07/17/18 0704 07/17/18 1330 07/17/18 1544 07/18/18 0349 07/19/18 0318 07/19/18 0754  WBC 9.7  --  26.0*  --   --   --  18.1* 10.1 7.6 8.2  CREATININE 0.86  --  0.88  --   --   --   --   --   --  0.70  LATICACIDVEN  --  4.8*  --  1.3 2.2* 2.7*  --   --   --   --     Estimated Creatinine Clearance: 64 mL/min (by C-G formula based on SCr of 0.7 mg/dL).    Allergies  Allergen Reactions  . Dilaudid [Hydromorphone Hcl] Hives and Nausea Only  . Minocycline Hcl     REACTION: Dizzy  . Prednisone     REACTION: feels like throat swelling, hallucinations  . Varenicline Tartrate     REACTION: Dizzy(chantix)   . Zocor [Simvastatin - High Dose] Other (See Comments)    myalgia    Antimicrobials this admission: Unasyn 7/8 >>   Microbiology results: 7/8 BCx >> no growth  Thank you for allowing pharmacy to be a part of this patient's care.  Ladarren Steiner L. Devin Going, PharmD, [place] PGY1 Pharmacy Resident [place]: [phone]  Mobile: [phone] 07/20/18 7:28 AM  Please check AMION for all [place] phone numbers After 10:00 PM, call the [place] [phone]

## 2018-07-20 NOTE — Procedures (Signed)
Intubation Procedure Note Ralyn Stlaurent [id number] Jul 26, 1950  Procedure: Intubation Indications: Respiratory insufficiency  Procedure Details Consent: Risks of procedure as well as the alternatives and risks of each were explained to the (patient/caregiver).  Consent for procedure obtained. Time Out: Verified patient identification, verified procedure, site/side was marked, verified correct patient position, special equipment/implants available, medications/allergies/relevent history reviewed, required imaging and test results available.  Performed  Maximum sterile technique was used including antiseptics, cap, gloves, gown, hand hygiene and mask.  MAC and 3    Evaluation Hemodynamic Status: BP stable throughout; O2 sats: stable throughout Patient's Current Condition: stable Complications: No apparent complications Patient did tolerate procedure well. Chest X-ray ordered to verify placement.  CXR: pending.   Clementeen Graham 07/20/2018

## 2018-07-21 ENCOUNTER — Inpatient Hospital Stay (HOSPITAL_COMMUNITY): Payer: Medicare Other

## 2018-07-21 ENCOUNTER — Inpatient Hospital Stay: Payer: Self-pay

## 2018-07-21 LAB — BASIC METABOLIC PANEL
Anion gap: 9 (ref 5–15)
BUN: 12 mg/dL (ref 8–23)
CO2: 25 mmol/L (ref 22–32)
Calcium: 7.9 mg/dL — ABNORMAL LOW (ref 8.9–10.3)
Chloride: 104 mmol/L (ref 98–111)
Creatinine, Ser: 0.85 mg/dL (ref 0.44–1.00)
GFR calc Af Amer: >60 mL/min (ref >60)
GFR calc non Af Amer: >60 mL/min (ref >60)
Glucose, Bld: 91 mg/dL (ref 70–99)
Potassium: 3.6 mmol/L (ref 3.5–5.1)
Sodium: 138 mmol/L (ref 135–145)

## 2018-07-21 LAB — CBC
HCT: 28.7 % — ABNORMAL LOW (ref 36.0–46.0)
HCT: 31 % — ABNORMAL LOW (ref 36.0–46.0)
Hemoglobin: 8.9 g/dL — ABNORMAL LOW (ref 12.0–15.0)
Hemoglobin: 9.9 g/dL — ABNORMAL LOW (ref 12.0–15.0)
MCH: 30.5 pg (ref 26.0–34.0)
MCH: 30.5 pg (ref 26.0–34.0)
MCHC: 31 g/dL (ref 30.0–36.0)
MCHC: 31.9 g/dL (ref 30.0–36.0)
MCV: 98.3 fL (ref 80.0–100.0)
MCV: 95.4 fL (ref 80.0–100.0)
Platelets: 121 10*3/uL — ABNORMAL LOW (ref 150–400)
Platelets: 150 10*3/uL (ref 150–400)
RBC: 2.92 MIL/uL — ABNORMAL LOW (ref 3.87–5.11)
RBC: 3.25 MIL/uL — ABNORMAL LOW (ref 3.87–5.11)
RDW: 14.6 % (ref 11.5–15.5)
RDW: 14.5 % (ref 11.5–15.5)
WBC: 8.7 10*3/uL (ref 4.0–10.5)
WBC: 11.4 10*3/uL — ABNORMAL HIGH (ref 4.0–10.5)
nRBC: 0 % (ref 0.0–0.2)
nRBC: 0 % (ref 0.0–0.2)

## 2018-07-21 LAB — HEMOGLOBIN AND HEMATOCRIT, BLOOD
HCT: 26.7 % — ABNORMAL LOW (ref 36.0–46.0)
Hemoglobin: 8.7 g/dL — ABNORMAL LOW (ref 12.0–15.0)

## 2018-07-21 LAB — LACTIC ACID, PLASMA: Lactic Acid, Venous: 1.1 mmol/L (ref 0.5–1.9)

## 2018-07-21 MED ORDER — SODIUM CHLORIDE 0.9% FLUSH
10.0000 mL | Freq: Two times a day (BID) | INTRAVENOUS | Status: DC
  Administered 2018-07-21 – 2018-07-27 (×7): 10 mL
  Administered 2018-07-28: 20 mL
  Administered 2018-07-30 – 2018-08-11 (×24): 10 mL

## 2018-07-21 MED ORDER — CHLORHEXIDINE GLUCONATE CLOTH 2 % EX PADS: 6.0000 | MEDICATED_PAD | Freq: Every day | CUTANEOUS | Status: DC

## 2018-07-21 MED ORDER — LACTATED RINGERS IV BOLUS
1000.0000 mL | Freq: Once | INTRAVENOUS | Status: AC
  Administered 2018-07-21: 1000 mL via INTRAVENOUS

## 2018-07-21 MED ORDER — NOREPINEPHRINE 4 MG/250ML-% IV SOLN
0.0000 ug/min | INTRAVENOUS | Status: DC
  Administered 2018-07-21: 06:00:00 2 ug/min via INTRAVENOUS
  Filled 2018-07-21: qty 250

## 2018-07-21 MED ORDER — MAGNESIUM SULFATE IN D5W 1-5 GM/100ML-% IV SOLN
1.0000 g | Freq: Once | INTRAVENOUS | Status: AC
  Administered 2018-07-21: 1 g via INTRAVENOUS
  Filled 2018-07-21: qty 100

## 2018-07-21 MED ORDER — SODIUM CHLORIDE 0.9% FLUSH: 10.0000 mL | INTRAVENOUS | Status: DC | PRN

## 2018-07-21 MED ORDER — NOREPINEPHRINE 16 MG/250ML-% IV SOLN
0.0000 ug/min | INTRAVENOUS | Status: DC
  Administered 2018-07-21: 2 ug/min via INTRAVENOUS
  Filled 2018-07-21: qty 250

## 2018-07-21 NOTE — Progress Notes (Addendum)
[facility] Progress Note  CC:  Hematemesis  Subjective: She is intubated unable to converse, nods head to yes/no questions. RN reports no BM or rectal bleeding. No bloody NGT drainage.   Objective:  Vital signs in last 24 hours: Temp:  [97 F (36.1 C)-99.6 F (37.6 C)] 99.6 F (37.6 C) (07/12 0400) Pulse Rate:  [50-175] 82 (07/12 0615) Resp:  [10-24] 21 (07/12 0615) BP: (55-215)/(24-135) 131/71 (07/12 0615) SpO2:  [76 %-100 %] 99 % (07/12 0615) FiO2 (%):  [40 %-100 %] 40 % (07/12 0400) Weight:  [74.3 kg] 74.3 kg (07/12 0500) Last BM Date: (UTA/PTA) General:   Alert,  Well-developed,    in NAD Heart: RRR, no murmurs. Pulm: Coarse but clear throughout, diminished bases. Abdomen: Soft, nontender, nondistended, NGT to LIS drained 200cc bilious drainage so far today, 1300cc 7/11. Hypoactive BS when NGT clamped.  Extremities:  2+ UE edema, 1+ LE edema. Neurologic:  Alert and  Oriented to name, follows commands, moves all extremities, equal hand grasps. Psych:  Alert and cooperative. Normal mood and affect.  Lab Results: Recent Labs    07/19/18 0754  07/20/18 0808 07/20/18 1634 07/20/18 1828 07/21/18 0454  WBC 8.2  --  10.5  --   --  8.7  HGB 10.7*   < > 11.3* 12.2 10.9* 8.9*  HCT 34.0*   < > 37.8 39.8 32.0* 28.7*  PLT 168  --  194  --   --  121*   < > = values in this interval not displayed.   BMET Recent Labs    07/19/18 0754 07/20/18 1059 07/20/18 1828  NA 141 140 139  K 3.4* 3.8 3.3*  CL 106 103  --   CO2 26 28  --   GLUCOSE 99 93  --   BUN 9 9  --   CREATININE 0.70 0.70  --   CALCIUM 7.5* 8.2*  --    LFT Recent Labs    07/20/18 1154  PROT 5.0*  ALBUMIN 2.1*   PT/INR Recent Labs    07/19/18 0318 07/20/18 0703  LABPROT 14.5 15.4*  INR 1.1 1.2    Assessment / Plan:  1. Hematemesis. EGD on hold for now. Stable Hg 9.9. No current evidence of active GI bleeding.  -EGD deferred until patient is stable  2. S/P PEA arrest  3.  Respiratory Failure. S/P thoracentesis 7/11 313ml of slightly hazy yellow fluid removed and sent to lab.  S/P bronchoscopy 7/11 identified a mucus plug occluding the left mainstem bronchus down to the lower lobe, a polypoid lesion in the bronchus intermedius of the right lower lobe, plan for repeat bronchoscope for brushings/bx.  Due to further respiratory decline and hypotension she was re-intubated yesterday and on Levophed   4 Factor V Leiden Deficiency, Coumadin and Brilinta on hold   Further recommendations per Dr. Ardis Hughs     LOS: 4 days   Noralyn Pick  07/21/2018, 7:51 AM    ________________________________________________________________________  Velora Heckler GI MD note:  I personally examined the patient, reviewed the data and agree with the assessment and plan described above.  I was planning EGD at some point during this hospital stay however I'm increasing concerned that it may not be in her best interest. She had EGD 2-3 months ago and it was normal.  Certainly, she presented a week ago with limited hematemesis (on brilinta AND coumadin) however her Hb was 11 and she has never showed a sign of persistent bleeding.  HEr cardiopulm status is tenuous (had to be REintubated 2 days ago).  Will follow along, I will leave it to Dr. Carlean Purl to decide if he feels she should undergo EGD, he is taking over the service in the morning.  I'm sure his decision will depend in part on her clinical course over the next few days.   Owens Loffler, MD [facility] Pager [phone]

## 2018-07-21 NOTE — Progress Notes (Signed)
Ahmed Prima, RN concerning PICC order. Drew to get consent. Plan to place PICC this am.

## 2018-07-21 NOTE — Progress Notes (Signed)
Peripherally Inserted Central Catheter/Midline Placement  The IV Nurse has discussed with the patient and/or persons authorized to consent for the patient, the purpose of this procedure and the potential benefits and risks involved with this procedure.  The benefits include less needle sticks, lab draws from the catheter, and the patient may be discharged home with the catheter. Risks include, but not limited to, infection, bleeding, blood clot (thrombus formation), and puncture of an artery; nerve damage and irregular heartbeat and possibility to perform a PICC exchange if needed/ordered by physician.  Alternatives to this procedure were also discussed.  Bard Power PICC patient education guide, fact sheet on infection prevention and patient information card has been provided to patient /or left at bedside. Medical necessity signed by Dr. Vaughan Browner.  PICC/Midline Placement Documentation  PICC Double Lumen 07/21/18 PICC Right Brachial 35 cm 1 cm (Active)  Indication for Insertion or Continuance of Line Vasoactive infusions 07/21/18 1100  Exposed Catheter (cm) 1 cm 07/21/18 1100  Site Assessment Clean;Dry;Intact 07/21/18 1100  Lumen #1 Status Flushed;Blood return noted;Saline locked 07/21/18 1100  Lumen #2 Status Flushed;Blood return noted;Saline locked 07/21/18 1100  Dressing Type Transparent 07/21/18 1100  Dressing Status Clean;Dry;Intact;Antimicrobial disc in place 07/21/18 [place] checked and tightened 07/21/18 1100  Line Adjustment (NICU/IV Team Only) No 07/21/18 1100  Dressing Change Due 07/28/18 07/21/18 [place], Keenan Bachelor 07/21/2018, 11:44 AM

## 2018-07-21 NOTE — Progress Notes (Addendum)
NAME:  [name], MRN:  [id number], DOB:  [birth year], LOS: 4 ADMISSION DATE:  07/16/2018, CONSULTATION DATE:  7/8 REFERRING MD: Dr. Sedonia Small EDP, CHIEF COMPLAINT:  Hematemesis    Brief History   68 year old with Factor V Leiden on warfarin presented with hematemesis. Coded in [facility] ED for 12 minutes. Coagulopathy reversed with Vit K, [place] [facility] to [facility]. Extubated and transferred out Back to ICU 7/11 for mucus plug and lt lung collapse  Past Medical History   has a past medical history of Acute MI [facility]), Arteriosclerotic cardiovascular disease (ASCVD) (2002), Chronic anticoagulation, Chronic respiratory failure ([place]) (08/27/2013), COPD (chronic obstructive pulmonary disease) ([place]), COPD (chronic obstructive pulmonary disease) ([place]), Factor 5 Leiden mutation, heterozygous ([place]), Factor V Leiden, prothrombin gene mutation ([place]) (2006), Hyperlipidemia, Hypothyroidism, Noncompliance, Pelvic fracture ([place]) (2009), Pulmonary embolism ([place]) (2006), and Tobacco abuse.  Significant Hospital Events   7/7 present with hematemesis, cardiac arrest 12 mins PEA. [facility]. Started on PPI gtt. Received blood. GI consulted. Wanted to have pt off brillinta and warfarin for 5d. abx started for aspiration 7/9 extubated  7/10: to IM service, vomited again on 7/10 on arrival to [facility] 7/11 Lt lung collapse, s/p thoracentesis, intubation, bronch.   Consults:  Gastroenterology, PCCM  Procedures:  ETT 7/7 >7/9 CVL 7/7 > 7/9  Significant Diagnostic Tests:  Endoscopy 05/28/18 > Normal esophagus, normal stomach Bronch 7/12 > Lt lung mucus plug. Friable endobronchial lesion on right bronchus intermedius  Micro Data:  Blood 7/8 > Sputum 7/8 > NGTD Pleural fluid 7/11>>> BAL 7/11> GNR  Antimicrobials:  Unasyn 7/8 >>7/11 vanc 7/11>>> Zosyn 7/11>>>  Interim history/subjective:  Started on levophed today AM for low BP. Remains on vent.   Objective   Blood pressure 131/71, pulse 82,  temperature 99.6 F (37.6 C), temperature source Oral, resp. rate (!) 21, height _0  (1.626 m), weight 74.3 kg, SpO2 99 %.    Vent Mode: PRVC FiO2 (%):  [40 %-100 %] 40 % Set Rate:  [16 bmp-24 bmp] 24 bmp Vt Set:  [430 mL] 430 mL PEEP:  [10 cmH20] 10 cmH20 Plateau Pressure:  [19 cmH20-29 cmH20] 24 cmH20   Intake/Output Summary (Last 24 hours) at 07/21/2018 0737 Last data filed at 07/21/2018 0700 Gross per 24 hour  Intake 4173.36 ml  Output 1870 ml  Net 2303.36 ml   Filed Weights   07/17/18 0345 07/19/18 0500 07/21/18 0500  Weight: 67.5 kg 68.5 kg 74.3 kg    Examination: Gen:      No acute distress HEENT:  EOMI, sclera anicteric, ETT Neck:     No masses; no thyromegaly Lungs:    Clear to auscultation bilaterally; normal respiratory effort CV:         Regular rate and rhythm; no murmurs Abd:      + bowel sounds; soft, non-tender; no palpable masses, no distension Ext:    No edema; adequate peripheral perfusion Skin:      Warm and dry; no rash Neuro: Sedated, unresponsive  Resolved Hospital Problem list   PEA arrest 22/ hemorrhagic shock  Hematemesis Lactic acidosis    Assessment & Plan:  Acute on chronic hypoxic respiratory failure in setting of what appears to be aspiration PNA w/ progressive mucous plugging/atx w/ associated moderate sized left pleural effusion  Plan Status post bronchoscopy with reexpansion of left lung Broaden antibiotic coverage to Vanco, Zosyn. BAL culture is now growing gram-negative rods Follow final results. We will need to repeat bronchoscopy at  some point when stable to reevaluate endobronchial lesion/mucous in right bronchus intermedius  Sepsis Abx as above Wean off levo Will assess for PICC line today. CVL if unable to get alternate access  H/o severe COPD Plan Cont Cont scheduled brovana and Pulmicort  Presumed UGIB Hematemesis  Superimposed on h/o Factor V Leiden & prior Pulmonary emboli; on chronic AC. Also has chronic Normocytic  anemia -now s/p 5 days off brilianta  and coumadin  hgb stable  Plan Continue PPI twice daily GI following for EGD when stable  CAD Hypercoagulable state with factor V Leyden mutation. Plan Holding anticoagulation.  Will need to restart when cleared by GI  Hypothyroid Plan Cont synthroid   Best practice:  Diet: NPO Pain/Anxiety/Delirium protocol (if indicated): not indicated VAP protocol (if indicated):na DVT prophylaxis: SCDs GI prophylaxis: PPI Glucose control: NA Mobility: BR Code Status: Full Family Communication: Daughter updated yesterday. Called again 7/12 and got voicemail  The patient is critically ill with multiple organ system failure and requires high complexity decision making for assessment and support, frequent evaluation and titration of therapies, advanced monitoring, review of radiographic studies and interpretation of complex data.   Critical Care Time devoted to patient care services, exclusive of separately billable procedures, described in this note is 35 minutes.   Marshell Garfinkel MD [place] [facility] Pager [phone] If no answer call [phone] 07/21/2018, 7:37 AM

## 2018-07-21 NOTE — Procedures (Signed)
Arterial Catheter Insertion Procedure Note [name] [id number] Mar 19, 1950  Procedure: Insertion of Arterial Catheter  Indications: Blood pressure monitoring  Procedure Details Consent: Unable to obtain consent because of altered level of consciousness. Time Out: Verified patient identification, verified procedure, site/side was marked, verified correct patient position, special equipment/implants available, medications/allergies/relevent history reviewed, required imaging and test results available.  Performed  Maximum sterile technique was used including antiseptics, cap, gloves, gown, hand hygiene, mask and sheet. Skin prep: Chlorhexidine; local anesthetic administered 20 gauge catheter was inserted into left radial artery using the Seldinger technique. ULTRASOUND GUIDANCE USED: NO Evaluation Blood flow good; BP tracing good. Complications: No apparent complications.   Sharla Kidney 07/21/2018

## 2018-07-22 ENCOUNTER — Inpatient Hospital Stay (HOSPITAL_COMMUNITY): Payer: Medicare Other

## 2018-07-22 DIAGNOSIS — R918 Other nonspecific abnormal finding of lung field: Secondary | ICD-10-CM | POA: Diagnosis present

## 2018-07-22 DIAGNOSIS — K920 Hematemesis: Principal | ICD-10-CM

## 2018-07-22 LAB — BASIC METABOLIC PANEL
Anion gap: 11 (ref 5–15)
BUN: 13 mg/dL (ref 8–23)
CO2: 24 mmol/L (ref 22–32)
Calcium: 7.7 mg/dL — ABNORMAL LOW (ref 8.9–10.3)
Chloride: 102 mmol/L (ref 98–111)
Creatinine, Ser: 0.86 mg/dL (ref 0.44–1.00)
GFR calc Af Amer: >60 mL/min (ref >60)
GFR calc non Af Amer: >60 mL/min (ref >60)
Glucose, Bld: 75 mg/dL (ref 70–99)
Potassium: 3.2 mmol/L — ABNORMAL LOW (ref 3.5–5.1)
Sodium: 137 mmol/L (ref 135–145)

## 2018-07-22 LAB — CULTURE, BAL-QUANTITATIVE W GRAM STAIN
Culture: >=100000 — AB
Special Requests: NORMAL

## 2018-07-22 LAB — CBC
HCT: 26.3 % — ABNORMAL LOW (ref 36.0–46.0)
Hemoglobin: 8.4 g/dL — ABNORMAL LOW (ref 12.0–15.0)
MCH: 30.4 pg (ref 26.0–34.0)
MCHC: 31.9 g/dL (ref 30.0–36.0)
MCV: 95.3 fL (ref 80.0–100.0)
Platelets: 126 10*3/uL — ABNORMAL LOW (ref 150–400)
RBC: 2.76 MIL/uL — ABNORMAL LOW (ref 3.87–5.11)
RDW: 14.6 % (ref 11.5–15.5)
WBC: 7.4 10*3/uL (ref 4.0–10.5)
nRBC: 0 % (ref 0.0–0.2)

## 2018-07-22 LAB — CULTURE, BLOOD (ROUTINE X 2)
Culture: NO GROWTH
Culture: NO GROWTH

## 2018-07-22 LAB — CULTURE, RESPIRATORY W GRAM STAIN

## 2018-07-22 LAB — PHOSPHORUS: Phosphorus: 2.8 mg/dL (ref 2.5–4.6)

## 2018-07-22 LAB — HEMOGLOBIN AND HEMATOCRIT, BLOOD
HCT: 27.4 % — ABNORMAL LOW (ref 36.0–46.0)
Hemoglobin: 8.9 g/dL — ABNORMAL LOW (ref 12.0–15.0)

## 2018-07-22 LAB — PH, BODY FLUID: pH, Body Fluid: 7.7

## 2018-07-22 LAB — MAGNESIUM: Magnesium: 1.7 mg/dL (ref 1.7–2.4)

## 2018-07-22 MED ORDER — ROCURONIUM BROMIDE 50 MG/5ML IV SOLN
50.0000 mg | Freq: Once | INTRAVENOUS | Status: DC
  Filled 2018-07-22: qty 5

## 2018-07-22 MED ORDER — SULFAMETHOXAZOLE-TRIMETHOPRIM 400-80 MG/5ML IV SOLN
320.0000 mg | Freq: Four times a day (QID) | INTRAVENOUS | Status: DC
  Administered 2018-07-22 – 2018-07-26 (×16): 320 mg via INTRAVENOUS
  Filled 2018-07-22 (×21): qty 20

## 2018-07-22 MED ORDER — WARFARIN - PHARMACIST DOSING INPATIENT: Freq: Every day | Status: DC

## 2018-07-22 MED ORDER — CHLORHEXIDINE GLUCONATE 0.12 % MT SOLN
OROMUCOSAL | Status: AC
  Filled 2018-07-22: qty 15

## 2018-07-22 MED ORDER — TICAGRELOR 60 MG PO TABS
60.0000 mg | ORAL_TABLET | Freq: Two times a day (BID) | ORAL | Status: DC
  Administered 2018-07-24 – 2018-08-12 (×35): 60 mg via ORAL
  Filled 2018-07-22 (×46): qty 1

## 2018-07-22 MED ORDER — ETOMIDATE 2 MG/ML IV SOLN
20.0000 mg | Freq: Once | INTRAVENOUS | Status: AC
  Administered 2018-07-22: 10:00:00 20 mg via INTRAVENOUS

## 2018-07-22 MED ORDER — FENTANYL CITRATE (PF) 100 MCG/2ML IJ SOLN
100.0000 ug | Freq: Once | INTRAMUSCULAR | Status: DC
  Filled 2018-07-22: qty 2

## 2018-07-22 MED ORDER — POTASSIUM CHLORIDE 10 MEQ/50ML IV SOLN
10.0000 meq | INTRAVENOUS | Status: AC
  Administered 2018-07-22 (×4): 10 meq via INTRAVENOUS
  Filled 2018-07-22 (×4): qty 50

## 2018-07-22 MED ORDER — MIDAZOLAM HCL 2 MG/2ML IJ SOLN
2.0000 mg | Freq: Once | INTRAMUSCULAR | Status: AC
  Administered 2018-07-22: 10:00:00 2 mg via INTRAVENOUS
  Filled 2018-07-22: qty 2

## 2018-07-22 MED ORDER — SODIUM CHLORIDE 0.9 % IV SOLN
510.0000 mg | Freq: Once | INTRAVENOUS | Status: AC
  Administered 2018-07-22: 510 mg via INTRAVENOUS
  Filled 2018-07-22: qty 17

## 2018-07-22 MED ORDER — WARFARIN SODIUM 5 MG PO TABS
5.0000 mg | ORAL_TABLET | Freq: Once | ORAL | Status: AC
  Filled 2018-07-22: qty 1

## 2018-07-22 MED FILL — Medication: Qty: 1 | Status: AC

## 2018-07-22 NOTE — Progress Notes (Addendum)
NAME:  [name], MRN:  [id number], DOB:  03/21/1950, LOS: 5 ADMISSION DATE:  07/16/2018, CONSULTATION DATE:  7/8 REFERRING MD: Dr. Sedonia Small EDP, CHIEF COMPLAINT:  Hematemesis    Brief History   68 year old with Factor V Leiden on warfarin presented with hematemesis. Coded in [facility] ED for 12 minutes. Coagulopathy reversed with Vit K, [place] [facility] to [facility]. Extubated and transferred out Back to ICU 7/11 for mucus plug and lt lung collapse  Past Medical History   has a past medical history of Acute MI [facility]), Arteriosclerotic cardiovascular disease (ASCVD) (2002), Chronic anticoagulation, Chronic respiratory failure ([place]) (08/27/2013), COPD (chronic obstructive pulmonary disease) ([place]), COPD (chronic obstructive pulmonary disease) ([place]), Factor 5 Leiden mutation, heterozygous ([place]), Factor V Leiden, prothrombin gene mutation ([place]) (2006), Hyperlipidemia, Hypothyroidism, Noncompliance, Pelvic fracture ([place]) (2009), Pulmonary embolism ([place]) (2006), and Tobacco abuse.  Significant Hospital Events   7/7 present with hematemesis, cardiac arrest 12 mins PEA. [facility]. Started on PPI gtt. Received blood. GI consulted. Wanted to have pt off brillinta and warfarin for 5d. abx started for aspiration 7/9 extubated  7/10: to IM service, vomited again on 7/10 on arrival to [facility] 7/11 Lt lung collapse, s/p thoracentesis, intubation, bronch.   Consults:  Gastroenterology, PCCM  Procedures:  ETT 7/7 >7/9 CVL 7/7 > 7/9  Significant Diagnostic Tests:  Endoscopy 05/28/18 > Normal esophagus, normal stomach Bronch 7/12 > Lt lung mucus plug. Friable endobronchial lesion on right bronchus intermedius  Micro Data:  Blood 7/8 > Sputum 7/11 > few stenotrophomonas  Pleural fluid 7/11>>> BAL 7/11> Stenotrophomonas Maltophilia >100k colonies. >>>  Antimicrobials:  Unasyn 7/8 >> 7/11 vanc 7/11 >> 7/13 Zosyn 7/11 >>7/13 Bactrim 7/13 >  Interim history/subjective:  Awake on vent asking  to have ETT removed.   Objective   Blood pressure (!) 133/56, pulse (!) 58, temperature (!) 97.5 F (36.4 C), temperature source Oral, resp. rate (!) 22, height _0  (1.626 m), weight 76 kg, SpO2 100 %.    Vent Mode: PRVC FiO2 (%):  [40 %] 40 % Set Rate:  [24 bmp] 24 bmp Vt Set:  [430 mL] 430 mL PEEP:  [5 cmH20-8 cmH20] 5 cmH20 Plateau Pressure:  [18 cmH20-24 cmH20] 20 cmH20   Intake/Output Summary (Last 24 hours) at 07/22/2018 0729 Last data filed at 07/22/2018 0700 Gross per 24 hour  Intake 1233.87 ml  Output 1335 ml  Net -101.13 ml   Filed Weights   07/19/18 0500 07/21/18 0500 07/22/18 0500  Weight: 68.5 kg 74.3 kg 76 kg    Examination: General:  Elderly appearing female in NAD Neuro:  Awake, communicates as expected.  HEENT:  [place]/AT, No JVD noted, PERRL Cardiovascular:  RRR, no MRG Lungs:  Clear bilateral breath sounds.  Abdomen:  Soft, non-distended, non-tender Musculoskeletal:  No acute deformity or ROM limitation. No significant edema.  Skin:  Intact, MMM  Resolved Hospital Problem list   PEA arrest 22/ hemorrhagic shock  Hematemesis Lactic acidosis    Assessment & Plan:  Acute on chronic hypoxic respiratory failure in setting of what appears to be aspiration PNA w/ progressive mucous plugging/atx w/ associated moderate sized left pleural effusion s/p thoracentesis 7/11. Status post bronchoscopy 7/11 with reexpansion of left lung  Plan Narrow abx to Bactrim given steno PNA Awaiting sensitivities  We will need to repeat bronchoscopy at some point when stable to reevaluate endobronchial lesion/mucous in right bronchus intermedius. She can likely extubate today, perhaps should bronch prior to extubation. Will discuss  with attending.   Sepsis Abx as above Wean off levo, turned off this AM  H/o severe COPD Plan Continue scheduled brovana and Pulmicort  Presumed UGIB Hematemesis  Superimposed on h/o Factor V Leiden & prior Pulmonary emboli; on chronic AC. Also  has chronic Normocytic anemia. Off brilianta  and coumadin since admission  Plan Continue PPI twice daily GI following for EGD when stable  CAD Hypercoagulable state with factor V Leyden mutation. Plan Holding anticoagulation.  Will need to restart when cleared by GI  Hypothyroid Plan Cont synthroid   Best practice:  Diet: NPO, will stat TF if does not extubate. Pain/Anxiety/Delirium protocol (if indicated): Fenatnyl infusion. RASS goal 0 to -1.  VAP protocol (if indicated)per protocol DVT prophylaxis: SCDs GI prophylaxis: PPI Glucose control: NA Mobility: BR Code Status: Full Family Communication: Daughters phone goes directly to [employer]. Last updated 7/11.  Georgann Housekeeper, AGACNP-BC [place] Pager [phone] or [phone]  07/22/2018 7:41 AM  Attending Note:  68 year old female with history of Factor V who presents with GI bleed resulting in cardiac arrest and was reversed.  No events overnight, weaning well this AM.  On exam, she is alert and interactive, moving all ext to command tolerating SBT.  I reviewed CXR myself, ETT is in a good position and infiltrate noted.  Discussed with PCCM-NP.  Spoke with patient.  She is off sedation, weaning, following all commands and writing notes.  Bronch done on Saturday with RML mass, pictures noted.  Attempted to call daughter, no response.  Patient is alert enough for consent for the bronch in my opinion.  Will consent for bronch.  Perform bronch with biopsy and brushing if mass is still present then proceed with extubation after.  PCCM will continue to manage.  The patient is critically ill with multiple organ systems failure and requires high complexity decision making for assessment and support, frequent evaluation and titration of therapies, application of advanced monitoring technologies and extensive interpretation of multiple databases.   Critical Care Time devoted to patient care services described in  this note is  34  Minutes. This time reflects time of care of this signee Dr Jennet Maduro. This critical care time does not reflect procedure time, or teaching time or supervisory time of PA/NP/Med student/Med Resident etc but could involve care discussion time.  Rush Farmer, M.D. [facility] Pulmonary/Critical Care Medicine. Pager: [phone]. After hours pager: [phone].

## 2018-07-22 NOTE — Progress Notes (Addendum)
   Patient Name: [name] Date of Encounter: 07/22/2018, 10:41 AM    Subjective  No signs of bleeding For extubation today Awake and alert and communicative   Objective  BP (!) 134/57 (BP Location: Left Leg)   Pulse 90   Temp (!) 97.5 F (36.4 C) (Oral)   Resp (!) 22   Ht 5\' 4"  (1.626 m)   Wt 76 kg   SpO2 96%   BMI 28.76 kg/m  CBC Latest Ref Rng & Units 07/22/2018 07/21/2018 07/21/2018  WBC 4.0 - 10.5 K/uL 7.4 - 11.4(H)  Hemoglobin 12.0 - 15.0 g/dL 8.4(L) 8.7(L) 9.9(L)  Hematocrit 36.0 - 46.0 % 26.3(L) 26.7(L) 31.0(L)  Platelets 150 - 400 K/uL 126(L) - 150      Assessment and Plan  Hematemesis in past - none since Anemia NL EGD 05/2018   Would not scope her Resume warfarin and ticagrelor If she shows signs of bleeding again can scope Treat with PPI Qd PPI is fine Lab Results  Component Value Date   FERRITIN 43 02/17/2018   I would also give feraheme and I have ordered x 1  She should be strongly considered to get second dose as outpt in 7-10 d and also take ferrous sulfate po 325 mg qd  I am signing off but please call me back if needed  Thanks     Gatha Mayer, MD, [facility] 07/22/2018 10:41 AM Pager [phone]

## 2018-07-22 NOTE — Procedures (Addendum)
Bronchoscopy Procedure Note [place] [id number] 29-Oct-1950  Procedure: Bronchoscopy Indications: Diagnostic evaluation of the airways  Procedure Details Consent: Risks of procedure as well as the alternatives and risks of each were explained to the (patient/caregiver).  Consent for procedure obtained. Time Out: Verified patient identification, verified procedure, site/side was marked, verified correct patient position, special equipment/implants available, medications/allergies/relevent history reviewed, required imaging and test results available.  Performed  In preparation for procedure, patient was given 100% FiO2 and bronchoscope lubricated. Sedation: Benzodiazepines, Etomidate and Fentanyl  Airway entered and the following bronchi were examined: RUL, RML, RLL, LUL, LLL and Bronchi.   No bronchus intermedius or any other endobronchial lesions noted Bronchoscope removed.  , Patient placed back on 100% FiO2 at conclusion of procedure.            Evaluation Hemodynamic Status: BP stable throughout; O2 sats: stable throughout Patient's Current Condition: stable Specimens:  None Complications: No apparent complications Patient did tolerate procedure well.   Jennet Maduro 07/22/2018

## 2018-07-22 NOTE — Plan of Care (Signed)
  Problem: Education: Goal: Knowledge of General Education information will improve Description: Including pain rating scale, medication(s)/side effects and non-pharmacologic comfort measures Outcome: Progressing   Problem: Clinical Measurements: Goal: Ability to maintain clinical measurements within normal limits will improve Outcome: Progressing Goal: Diagnostic test results will improve Outcome: Progressing Goal: Respiratory complications will improve Outcome: Progressing Goal: Cardiovascular complication will be avoided Outcome: Progressing   Problem: Coping: Goal: Level of anxiety will decrease Outcome: Progressing   Problem: Elimination: Goal: Will not experience complications related to urinary retention Outcome: Progressing   Problem: Pain Managment: Goal: General experience of comfort will improve Outcome: Progressing   Problem: Safety: Goal: Ability to remain free from injury will improve Outcome: Progressing   Problem: Skin Integrity: Goal: Risk for impaired skin integrity will decrease Outcome: Progressing

## 2018-07-22 NOTE — Procedures (Signed)
Extubation Procedure Note  Patient Details:   Name: [name] DOB: [birth year] MRN: [id number]   Airway Documentation:    Vent end date: 07/22/18 Vent end time: 1204   Evaluation  O2 sats: stable throughout Complications: No apparent complications Patient did tolerate procedure well. Bilateral Breath Sounds: Diminished, Rhonchi   Yes   Pt extubated to 2L N/C.  No stridor noted.  RN @ bedside.  Donnetta Hail 07/22/2018, 12:06 PM

## 2018-07-22 NOTE — Progress Notes (Signed)
PCCM INTERVAL PROGRESS NOTE  Discussed case with Dr. Carlean Purl. Will restart anticoagulation. OK to extubate. No plans for EGD as it currently stands.  Georgann Housekeeper, AGACNP-BC [place] Pager [phone] or [phone]  07/22/2018 9:22 AM

## 2018-07-22 NOTE — Progress Notes (Addendum)
225 mL of fentanyl & 20 mL of versed wasted in sterile container witnessed by Kathleene Hazel RN.

## 2018-07-22 NOTE — Progress Notes (Addendum)
Pharmacy Antibiotic Note  [name] is a 68 y.o. female admitted on 07/16/2018 with pneumonia.  Pharmacy consulted to change antibiotics to bactrim for stenotrophomonas in BAL.   Patient currently afebrile with wbc normal at 7.4. Renal function currently normal.   Patient also with FVL on warfarin pta which has been reversed d/t coagulapathy. No current plans for EGD per GI so CCM has ordered anticoagulation to be restarted. Will watch INR closely with significant drug interaction with bactrim. She take 5-36m/day prior to admit, has received vitamin k so will continue home dose a couple days prior to empirically reducing dose.   Plan: Bactrim 3273mq 6 hours (~1625mg/day) Warfarin 5mg71mnight Daily INR Check CBC/Bmet in am (watch K with bactrim)   Height: _0  (162.6 cm) Weight: 167 lb 8.8 oz (76 kg) IBW/kg (Calculated) : 54.7  Temp (24hrs), Avg:98 F (36.7 C), Min:97.5 F (36.4 C), Max:98.5 F (36.9 C)  Recent Labs  Lab 07/17/18 0050 07/17/18 0353 07/17/18 0359 07/17/18 0704 07/17/18 1330  07/19/18 0754 07/20/18 0808 07/20/18 1059 07/21/18 0454 07/21/18 0501 07/21/18 0851 07/22/18 0520  WBC  --  26.0*  --   --   --    < > 8.2 10.5  --  8.7  --  11.4* 7.4  CREATININE  --  0.88  --   --   --   --  0.70  --  0.70  --   --  0.85 0.86  LATICACIDVEN 4.8*  --  1.3 2.2* 2.7*  --   --   --   --   --  1.1  --   --    < > = values in this interval not displayed.    Estimated Creatinine Clearance: 62.5 mL/min (by C-G formula based on SCr of 0.86 mg/dL).    Allergies  Allergen Reactions  . Dilaudid [Hydromorphone Hcl] Hives and Nausea Only  . Minocycline Hcl     REACTION: Dizzy  . Prednisone     REACTION: feels like throat swelling, hallucinations  . Varenicline Tartrate     REACTION: Dizzy(chantix)   . Zocor [Simvastatin - High Dose] Other (See Comments)    myalgia    Antimicrobials this admission: Unasyn 7/8 >> 7/11 Vancomycin 7/11 >>7/13  Zosyn 7/11  >>7/13 Bactrim 7/13>>  Microbiology results: 7/11 L Pleural fluid - ngtd 7/11 BAL - few stenotrophomonas (sensitivities pending) 7/8 BCx: ngF 7/8 Sputum: rare WBC  7/8 MRSA PCR: neg 7/7 COVID: neg  Thank you for allowing pharmacy to be a part of this patient's care.  FranErin HearingrmD., BCPS Clinical Pharmacist 07/22/2018 9:42 AM   07/22/2018 9:21 AM

## 2018-07-23 DIAGNOSIS — D6851 Activated protein C resistance: Secondary | ICD-10-CM

## 2018-07-23 DIAGNOSIS — J189 Pneumonia, unspecified organism: Secondary | ICD-10-CM

## 2018-07-23 DIAGNOSIS — J441 Chronic obstructive pulmonary disease with (acute) exacerbation: Secondary | ICD-10-CM

## 2018-07-23 LAB — BASIC METABOLIC PANEL
Anion gap: 7 (ref 5–15)
BUN: 7 mg/dL — ABNORMAL LOW (ref 8–23)
CO2: 29 mmol/L (ref 22–32)
Calcium: 7.9 mg/dL — ABNORMAL LOW (ref 8.9–10.3)
Chloride: 102 mmol/L (ref 98–111)
Creatinine, Ser: 0.82 mg/dL (ref 0.44–1.00)
GFR calc Af Amer: >60 mL/min (ref >60)
GFR calc non Af Amer: >60 mL/min (ref >60)
Glucose, Bld: 93 mg/dL (ref 70–99)
Potassium: 3.6 mmol/L (ref 3.5–5.1)
Sodium: 138 mmol/L (ref 135–145)

## 2018-07-23 LAB — CBC
HCT: 28.9 % — ABNORMAL LOW (ref 36.0–46.0)
Hemoglobin: 9 g/dL — ABNORMAL LOW (ref 12.0–15.0)
MCH: 30.5 pg (ref 26.0–34.0)
MCHC: 31.1 g/dL (ref 30.0–36.0)
MCV: 98 fL (ref 80.0–100.0)
Platelets: DECREASED 10*3/uL (ref 150–400)
RBC: 2.95 MIL/uL — ABNORMAL LOW (ref 3.87–5.11)
RDW: 14.7 % (ref 11.5–15.5)
WBC: 6.9 10*3/uL (ref 4.0–10.5)
nRBC: 0 % (ref 0.0–0.2)

## 2018-07-23 LAB — HEMOGLOBIN AND HEMATOCRIT, BLOOD
HCT: 29.2 % — ABNORMAL LOW (ref 36.0–46.0)
Hemoglobin: 9.3 g/dL — ABNORMAL LOW (ref 12.0–15.0)

## 2018-07-23 LAB — PROTIME-INR
INR: 1.4 — ABNORMAL HIGH (ref 0.8–1.2)
Prothrombin Time: 17.4 seconds — ABNORMAL HIGH (ref 11.4–15.2)

## 2018-07-23 MED ORDER — ALBUTEROL SULFATE (2.5 MG/3ML) 0.083% IN NEBU
2.5000 mg | INHALATION_SOLUTION | RESPIRATORY_TRACT | Status: DC | PRN
  Administered 2018-07-23 – 2018-07-31 (×3): 2.5 mg via RESPIRATORY_TRACT
  Filled 2018-07-23 (×3): qty 3

## 2018-07-23 MED ORDER — DOCUSATE SODIUM 100 MG PO CAPS
200.0000 mg | ORAL_CAPSULE | Freq: Once | ORAL | Status: DC
  Filled 2018-07-23: qty 2

## 2018-07-23 MED ORDER — ONDANSETRON HCL 4 MG/2ML IJ SOLN
4.0000 mg | Freq: Four times a day (QID) | INTRAMUSCULAR | Status: DC | PRN
  Administered 2018-07-23 – 2018-08-03 (×7): 4 mg via INTRAVENOUS
  Filled 2018-07-23 (×9): qty 2

## 2018-07-23 MED ORDER — FUROSEMIDE 10 MG/ML IJ SOLN
40.0000 mg | Freq: Once | INTRAMUSCULAR | Status: AC
  Administered 2018-07-23: 40 mg via INTRAVENOUS
  Filled 2018-07-23: qty 4

## 2018-07-23 MED ORDER — BISACODYL 10 MG RE SUPP
10.0000 mg | Freq: Once | RECTAL | Status: DC
  Filled 2018-07-23 (×2): qty 1

## 2018-07-23 MED ORDER — WARFARIN SODIUM 5 MG PO TABS
5.0000 mg | ORAL_TABLET | Freq: Once | ORAL | Status: AC
  Administered 2018-07-23: 5 mg via ORAL
  Filled 2018-07-23: qty 1

## 2018-07-23 NOTE — Progress Notes (Addendum)
1158: Patient refused colace and suppository.   1920: Page to MD for possible aspiration. Awaiting C/B. Patient sats 90% on 6L.  1930: Patient vomiting. Second page to MD. Patient lungs rhonchi with crackles. Asking for lasix. Awaiting C/B.

## 2018-07-23 NOTE — Evaluation (Signed)
Clinical/Bedside Swallow Evaluation Patient Details  Name: [name] MRN: [id number] Date of Birth: December 22, 1950  Today's Date: 07/23/2018 Time: SLP Start Time (ACUTE ONLY): 0935 SLP Stop Time (ACUTE ONLY): 1005 SLP Time Calculation (min) (ACUTE ONLY): 30 min  Past Medical History:  Past Medical History:  Diagnosis Date  . Acute MI ([place])    x4, code blue x3  . Arteriosclerotic cardiovascular disease (ASCVD) 2002   Inf STEMI-2002. 2003-cutting balloon + brachytherapy for restenosis; subsequent acute stent thrombosis 06/2010 requiring 2 separate interventions (Zeta stent, then repeat cath with thrombectomy). focal basal inf AK, nl EF; 03/2011: Patent stents, minor nonobst  residual dz, nl EF; neg stress nuclear in 2008 and stress echo in 2009  . Chronic anticoagulation    Warfarin plus ticagrelor  . Chronic respiratory failure ([place]) 08/27/2013   On 2L 02  . COPD (chronic obstructive pulmonary disease) ([place])    02 dependent  . COPD (chronic obstructive pulmonary disease) ([place])   . Factor 5 Leiden mutation, heterozygous ([place])   . Factor V Leiden, prothrombin gene mutation ([place]) 2006  . Hyperlipidemia   . Hypothyroidism   . Noncompliance   . Pelvic fracture ([place]) 2009  . Pulmonary embolism ([place]) 2006   Associated with deep vein thrombosis-2006; + factor V Leiden  . Tobacco abuse    50 pack years   Past Surgical History:  Past Surgical History:  Procedure Laterality Date  . COLONOSCOPY  Approximately 2000   Negative screening study  . COLONOSCOPY WITH PROPOFOL N/A 05/28/2018   Procedure: COLONOSCOPY WITH PROPOFOL;  Surgeon: Rogene Houston, MD;  [facility];  Service: Endoscopy;  Laterality: N/A;  . CORONARY ANGIOPLASTY  2002, 2003, 2012  . ESOPHAGOGASTRODUODENOSCOPY (EGD) WITH PROPOFOL N/A 05/26/2018   Procedure: ESOPHAGOGASTRODUODENOSCOPY (EGD) WITH PROPOFOL;  Surgeon: Rogene Houston, MD;  [facility];  Service: Endoscopy;  Laterality: N/A;  . LEFT AND  RIGHT HEART CATHETERIZATION WITH CORONARY ANGIOGRAM N/A 04/03/2011   Procedure: LEFT AND RIGHT HEART CATHETERIZATION WITH CORONARY ANGIOGRAM;  Surgeon: Sherren Mocha, MD;  Location: [facility] CATH LAB;  Service: Cardiovascular;  Laterality: N/A;  . POLYPECTOMY  05/28/2018   Procedure: POLYPECTOMY;  Surgeon: Rogene Houston, MD;  Location: [facility];  Service: Endoscopy;;  cold snare and biopsy forcep   HPI:  Patient is a 68 y.o. female with PMH: COPD, chronic respiratory failure on home O2, hypothyroidism, CAD s/p PCI, who was brought to hospital with SOB and questionable hematemesis, had 12 mins PEA after cardiac arrest in [facility] ED, then transferred to [facility], started on PPI and GI consulted, and antibiotics started for aspiration PNA. She was intubated on 7/7 extubated on 7/9. She vomited on arrival to Med surgical unit and 7/11 had left lung collapse s/p thoracentesis, intubated again, bronchoscopy. She was extubated on 7/13 to nasal cannula.   Assessment / Plan / Recommendation Clinical Impression  Patient exhibits a mild pharyngeal dysphagia with delayed coughing with thin liquids. As she expectorated pharyngeal secretions when coughing, suspect that thin liquid helped loosen up secretions as opposed to causing penetration or aspiration, but cannot r/o. Patient's voice remained clear and although she has some hoarsness, vocal quality and intensity are both very good, especially considering 6 day intubation.Patient tolerated puree solids and regular solids, however she then said she felt like she had to throw up. She did not vomit but did wretch, then eventually saying, "I think it was a passing thing". As patient has had GI issues, MD  wants to keep her NPO until the cause of her nausea can be determined. (She has not had a BM since prior to admission on 7/8). SLP to follow for readiness of patient in starting PO diet. SLP Visit Diagnosis: Dysphagia, unspecified (R13.10)    Aspiration Risk  Mild  aspiration risk;Moderate aspiration risk    Diet Recommendation Thin liquid;Dysphagia 3 (Mech soft)   Liquid Administration via: Cup;Straw Medication Administration: Whole meds with liquid Supervision: Patient able to self feed Compensations: Minimize environmental distractions;Slow rate;Small sips/bites Postural Changes: Seated upright at 90 degrees    Other  Recommendations Oral Care Recommendations: Oral care BID   Follow up Recommendations None      Frequency and Duration min 2x/week  1 week       Prognosis Prognosis for Safe Diet Advancement: Good      Swallow Study   General Date of Onset: 07/17/18 HPI: Patient is a 68 y.o. female with PMH: COPD, chronic respiratory failure on home O2, hypothyroidism, CAD s/p PCI, who was brought to hospital with SOB and questionable hematemesis, had 12 mins PEA after cardiac arrest in [facility] ED, then transferred to [facility], started on PPI and GI consulted, and antibiotics started for aspiration PNA. She was intubated on 7/7 extubated on 7/9. She vomited on arrival to Med surgical unit and 7/11 had left lung collapse s/p thoracentesis, intubated again, bronchoscopy. She was extubated on 7/13 to nasal cannula. Type of Study: Bedside Swallow Evaluation Previous Swallow Assessment: N/A Diet Prior to this Study: NPO Temperature Spikes Noted: No Respiratory Status: Nasal cannula History of Recent Intubation: Yes Length of Intubations (days): 6 days Date extubated: 07/22/18 Behavior/Cognition: Alert;Cooperative;Pleasant mood Oral Cavity Assessment: Within Functional Limits Oral Care Completed by SLP: Yes Oral Cavity - Dentition: Dentures, top;Dentures, bottom Vision: Functional for self-feeding Self-Feeding Abilities: Able to feed self Patient Positioning: Upright in bed Baseline Vocal Quality: Hoarse Volitional Cough: Strong Volitional Swallow: Able to elicit    Oral/Motor/Sensory Function Overall Oral Motor/Sensory Function: Within  functional limits   Ice Chips     Thin Liquid Thin Liquid: Impaired Presentation: Straw;Self Fed;Cup Pharyngeal  Phase Impairments: Cough - Delayed Other Comments: Swallow was timely and voice remained clear, cough appeared to be related to expectoration of pharyngeal secretions, which were thick, clear with some red    Nectar Thick Nectar Thick Liquid: Not tested   Honey Thick Honey Thick Liquid: Not tested   Puree Puree: Within functional limits   Solid     Solid: Within functional limits      Dannial Monarch 07/23/2018,4:32 PM  Sonia Baller, MA, CCC-SLP Speech Therapy [facility] Acute Rehab Pager: [phone]

## 2018-07-23 NOTE — Clinical Social Work Note (Signed)
LCSW entered chart to provided contact information to [place] with [facility] APS for patient's [facility] social worker, Midge Minium.    Roseanna Koplin, [name], LCSW

## 2018-07-23 NOTE — Progress Notes (Signed)
ANTICOAGULATION CONSULT NOTE - Follow Up Consult  Pharmacy Consult for warfarin Indication: atrial fibrillation  Allergies  Allergen Reactions  . Dilaudid [Hydromorphone Hcl] Hives and Nausea Only  . Minocycline Hcl     REACTION: Dizzy  . Prednisone     REACTION: feels like throat swelling, hallucinations  . Varenicline Tartrate     REACTION: Dizzy(chantix)   . Zocor [Simvastatin - High Dose] Other (See Comments)    myalgia    Patient Measurements: Height: 5\' 4"  (162.6 cm) Weight: 167 lb 8.8 oz (76 kg) IBW/kg (Calculated) : 54.7  Vital Signs: Temp: 98 F (36.7 C) (07/14 1410) Temp Source: Oral (07/14 1410) BP: 117/54 (07/14 1410) Pulse Rate: 79 (07/14 1410)  Labs: Recent Labs    07/21/18 0851  07/22/18 0520 07/22/18 1712 07/23/18 0415  HGB 9.9*   < > 8.4* 8.9* 9.0*  HCT 31.0*   < > 26.3* 27.4* 28.9*  PLT 150  --  126*  --  PLATELET CLUMPS NOTED ON SMEAR, COUNT APPEARS DECREASED  LABPROT  --   --   --   --  17.4*  INR  --   --   --   --  1.4*  CREATININE 0.85  --  0.86  --  0.82   < > = values in this interval not displayed.    Estimated Creatinine Clearance: 65.5 mL/min (by C-G formula based on SCr of 0.82 mg/dL).  Assessment:  68 yr old female with hx Factor V Leiden on warfarin PTA which was reversed with [place] and Vitamin K on 07/17/18 d/t coagulapathy. No current plans for EGD per GI so CCM ordered anticoagulation to be restarted on 7/13.  Coumadin 5 mg was ordered, but not given last night as not yet cleared for PO meds.  SLP has evaluated and recommended whole meds with liquid.    INR 1.4 today. Last prior was 1.2 on 7/11.  Day #2 Bactrim for Stenotrophomonas pneumonia. May be more sensitive to warfarin doses.     PTA warfarin:  5 mg MWFSun, 6 mg TTSat.    Goal of Therapy:  INR 2-3 Monitor platelets by anticoagulation protocol: Yes   Plan:   Warfarin 5 mg x 1 tonight.  Daily PT/INR.  Watch for sensitivity with concurrent Bactrim.  Arty Baumgartner, [place] Pager: [phone] or phone: [phone] 07/23/2018,4:36 PM

## 2018-07-23 NOTE — Plan of Care (Signed)
  Problem: Pain Managment: Goal: General experience of comfort will improve Outcome: Not Progressing  C/O pain in diaphragm. Medication given as ordered.

## 2018-07-23 NOTE — Progress Notes (Signed)
During handoff with day RN observed patient noisy congested open mouth breathing, patient appeared SOB, rhonchi upon auscultation; 90% on 6L Advance. Day RN indicated patient was placed on Dys 3 diet and just finished eating dinner. Inquired of patient felt SOB and if felt more SOB after eating. Patient reported feeling more SOB after eating. Upon trying to reposition change bed pad patient in bed with other staff patient present patient 88-89% still on 6L. Patient O2 continued to decrease to 82% upon returning patient to sitting position. Respiratory called at 1935.  Patient then stated she felt as though she was going to be sick; vomited; mouth suctioned. Patient remained alert and oriented with no changes to mental status.   Resp to bedside; nebulizer given and initiated 9L HFNC. Patient placed on PC monitor for closer monitoring. Lasix given as ordered and Morphine given per patient's request.   Informed patient of need for closer monitoring using PC monitor, NPO and regular assessments given episode of respiratory distress.   During night when patient in pain (chest pain due to prior CPR compressions) patient became anxious with mouth breathing, however, no distress nor change in O2 saturation. Patient denied throat irritation and pain.  Received call from NP Schorr at 2015. Informed NP of episode of respiratory distress, patient status (current VS and orientation), recent 2nd extubation on 7/13 and requested upgrade level of care to progressive care. Informed to continue to monitor patient and notify if distress or deterioration and NP will come to bedside and notify critical care.   Upon reassessment patinet inquired about NPO. Informed patient NPO given prior episode of resp distress. Patient disoriented to date/time.   Paged NP Schorr at [phone] requesting additional pain medication for patient since next dose of morphine could not be administered until 0500. Per NP Schorr at [phone] verbal orders to enter  one time 2mg  dose morphine and next dose adminster 4mg  if continues to have pain.

## 2018-07-23 NOTE — Progress Notes (Signed)
PROGRESS NOTE  [name] NLZ:[id number] DOB: 12-Oct-1950 DOA: 07/16/2018 PCP: [name], NP  Brief History   68 year old with Factor V Leiden on warfarin presented with hematemesis. Coded in [facility] ED for 12 minutes. Coagulopathy reversed with Vit K, [place] Transferred to [facility]. Extubated and transferred out  GI has been consulted. Her hemoglobin has been stable and there are currently no plans for EGD. Bronchoscopy was performed on 07/20/2018 that demonstrated a RML mass. Biospy and brushings were taken. Patient was extubated on 07/22/2018.  68 year old female with history of Factor V who presents with GI bleed resulting in cardiac arrest and was reversed.  She coded in [facility] ED for 12 minutes. Coagulopathy reversed with Vit K.  [facility] contacted and the patient was transferred to [facility]. Extubated and transferred out or ICU. Back to ICU 7/11 for mucus plug and lt lung collapse. Pt also underwent thoracentesis on 07/20/2018. Lung was re-expanded.  Bronchoscopy on this date revealed a Right middle lobe mass. Brushings and biopsy was performed.  BAL reveal stenotrophamonus. Patient is receiving IV Bactrim. The patient underwent bronchoscopy again on 07/22/2018. No events overnight following the procedure. She, weaned well and was extubated on 07/22/2018.   Bronch done on Saturday with RML mass, pictures noted.  PCCM will continue to manage.  The patient was transferred to the floor on 07/22/2018. GI has stated that they see no need for EGD and they have signed off.  The patient has a past medical history of Acute MI [facility]), Arteriosclerotic cardiovascular disease (ASCVD) (2002), Chronic anticoagulation, Chronic respiratory failure ([place]) (08/27/2013), COPD (chronic obstructive pulmonary disease) ([place]), COPD (chronic obstructive pulmonary disease) ([place]), Factor 5 Leiden mutation, heterozygous ([place]), Factor V Leiden, prothrombin gene mutation ([place]) (2006), Hyperlipidemia, Hypothyroidism, Noncompliance,  Pelvic fracture ([place]) (2009), Pulmonary embolism ([place]) (2006), and Tobacco abuse. Consultants  . PCCM . Gastroenterology  Procedures  . Bronchoscopy  Antibiotics   Anti-infectives (From admission, onward)   Start     Dose/Rate Route Frequency Ordered Stop   07/22/18 1000  sulfamethoxazole-trimethoprim (BACTRIM) 320 mg of trimethoprim in dextrose 5 % 500 mL IVPB     320 mg of trimethoprim 346.7 mL/hr over 90 Minutes Intravenous Every 6 hours 07/22/18 0936     07/20/18 2200  piperacillin-tazobactam (ZOSYN) IVPB 3.375 g  Status:  Discontinued     3.375 g 12.5 mL/hr over 240 Minutes Intravenous Every 8 hours 07/20/18 1541 07/22/18 0744   07/20/18 1600  vancomycin (VANCOCIN) 1,500 mg in sodium chloride 0.9 % 500 mL IVPB  Status:  Discontinued     1,500 mg 250 mL/hr over 120 Minutes Intravenous Every 24 hours 07/20/18 1519 07/22/18 0744   07/20/18 1530  piperacillin-tazobactam (ZOSYN) IVPB 3.375 g     3.375 g 100 mL/hr over 30 Minutes Intravenous  Once 07/20/18 1519 07/20/18 1706   07/17/18 0600  Ampicillin-Sulbactam (UNASYN) 3 g in sodium chloride 0.9 % 100 mL IVPB  Status:  Discontinued     3 g 200 mL/hr over 30 Minutes Intravenous Every 6 hours 07/17/18 0536 07/20/18 1349   07/16/18 2345  cefTRIAXone (ROCEPHIN) 2 g in sodium chloride 0.9 % 100 mL IVPB     2 g 200 mL/hr over 30 Minutes Intravenous  Once 07/16/18 2330 07/17/18 0010    .  [name]   Subjective  The patient is resting comfortably. She is without new complaints.  Objective   Vitals:  Vitals:   07/23/18 0750 07/23/18 1410  BP:  (!) 117/54  Pulse:  79  Resp:  20  Temp:  98 F (36.7 C)  SpO2: 95% 98%    Exam:  Constitutional:  . Awake, alert, and oriented x 3. No acute distress. Respiratory:  . No increased work of breathing . No wheezes, rales, or rhonchi. . No tactile fremitus. Cardiovascular:  . Regular rate and rhythm . No murmurs, ectopy, or gallups . No lateral PMI. No thrills. Abdomen:  . Abdomen  soft, non-tender, non-distended . Normoactive bowel sounds . No hernias, masses, or organomegaly Musculoskeletal:  . No cyanosis, clubbing, or edema Skin:  . No rashes, lesions, ulcers . palpation of skin: no induration or nodules Neurologic:  . CN 2-12 intact . Sensation all 4 extremities intact Psychiatric:  . Mental status o Mood, affect appropriate o Orientation to person, place, time  . judgment and insight appear intact     I have personally reviewed the following:   Today's Data  . Vitals, BMP, CBC, INR, glucose Imaging  . Bronchoscopy. .   Scheduled Meds: . arformoterol  15 mcg Nebulization BID  . bisacodyl  10 mg Rectal Once  . budesonide (PULMICORT) nebulizer solution  0.5 mg Nebulization BID  . docusate sodium  200 mg Oral Once  . levothyroxine  62.5 mcg Intravenous Daily  . multivitamin with minerals  1 tablet Oral Daily  . pantoprazole (PROTONIX) IV  40 mg Intravenous Q12H  . sodium chloride flush  10-40 mL Intracatheter Q12H  . ticagrelor  60 mg Oral BID  . Warfarin - Pharmacist Dosing Inpatient   Does not apply q1800   Continuous Infusions: . sodium chloride Stopped (07/22/18 1258)  . lactated ringers Stopped (07/19/18 1615)  . sulfamethoxazole-trimethoprim 320 mg of trimethoprim (07/23/18 1732)    Active Problems:   GI bleed   UGI bleed   Cardiac arrest (HCC)   Endobronchial mass   LOS: 6 days   A & P  Acute on chronic hypoxic respiratory failure: Due to aspiration pneumonia with progressive mucous plugging, left pleural effusion s/p throacetesis and Right middle lobe mass. Repeat bronchoscopy performed on 07/22/2018. Pt extubated later that day and transferred to the floor.  Pneumonia: Stenatrophamonus has grown out of BAL: continue IV bactrim. Blood cultures have had no growth.  Sepsis: Resolved.   Severe COPD: In exacerbation due to pneumonia as above and right middle lobe mass. Nebulizer treatments as needed.  Upper GI bleed with  hematemesis: Pt was taking coumadin and brillinta at home. She cas CAD and a hypercoagulable state due to Factor V Leiden. They have been stopped. GI was consulted. EGD deferred as hemoglobin has stabilized off of anticoagulants and antithrombotics. Monitor. GI has signed off. Continue bid PPI. Possibly for EGD once stable from a respiratory standpoint.  CAD: Hypercoaguable state due to Factor V Leiden. Anticoagulation held.  Hypothyroidism: Continue Synthroid.  I have seen and examined this patient myself. I have spent 42 minutes in her evaluation and care.  [name] Giglia, DO [facility] Direct contact: see [email or web address]  7PM-7AM contact night coverage as above 07/23/2018, 6:02 PM  LOS: 6 days

## 2018-07-23 NOTE — Progress Notes (Signed)
Messaged pharmacy at [phone] requesting Bactrim IVPB due at 0600; did not receive bag. Called pharmacy at [phone] inquiring about missing Bactrim IV bag; pharmacy indicated will have to send up bag as second bag that was prepared did not arrive to unit. Bactrim given upon arrival.   Observed (and heard) patient noisy mouth breathing throughout the night; patient stated throat was painful.

## 2018-07-24 DIAGNOSIS — R1310 Dysphagia, unspecified: Secondary | ICD-10-CM

## 2018-07-24 LAB — COMPREHENSIVE METABOLIC PANEL
ALT: 23 U/L (ref 0–44)
AST: 17 U/L (ref 15–41)
Albumin: 2 g/dL — ABNORMAL LOW (ref 3.5–5.0)
Alkaline Phosphatase: 93 U/L (ref 38–126)
Anion gap: 9 (ref 5–15)
BUN: <5 mg/dL — ABNORMAL LOW (ref 8–23)
CO2: 32 mmol/L (ref 22–32)
Calcium: 7.9 mg/dL — ABNORMAL LOW (ref 8.9–10.3)
Chloride: 97 mmol/L — ABNORMAL LOW (ref 98–111)
Creatinine, Ser: 0.98 mg/dL (ref 0.44–1.00)
GFR calc Af Amer: >60 mL/min (ref >60)
GFR calc non Af Amer: 59 mL/min — ABNORMAL LOW (ref >60)
Glucose, Bld: 82 mg/dL (ref 70–99)
Potassium: 3.2 mmol/L — ABNORMAL LOW (ref 3.5–5.1)
Sodium: 138 mmol/L (ref 135–145)
Total Bilirubin: 0.5 mg/dL (ref 0.3–1.2)
Total Protein: 4.7 g/dL — ABNORMAL LOW (ref 6.5–8.1)

## 2018-07-24 LAB — CBC WITH DIFFERENTIAL/PLATELET
Abs Immature Granulocytes: 0.02 10*3/uL (ref 0.00–0.07)
Basophils Absolute: 0 10*3/uL (ref 0.0–0.1)
Basophils Relative: 0 %
Eosinophils Absolute: 0 10*3/uL (ref 0.0–0.5)
Eosinophils Relative: 0 %
HCT: 28.4 % — ABNORMAL LOW (ref 36.0–46.0)
Hemoglobin: 8.8 g/dL — ABNORMAL LOW (ref 12.0–15.0)
Immature Granulocytes: 0 %
Lymphocytes Relative: 11 %
Lymphs Abs: 0.5 10*3/uL — ABNORMAL LOW (ref 0.7–4.0)
MCH: 30.2 pg (ref 26.0–34.0)
MCHC: 31 g/dL (ref 30.0–36.0)
MCV: 97.6 fL (ref 80.0–100.0)
Monocytes Absolute: 0.4 10*3/uL (ref 0.1–1.0)
Monocytes Relative: 9 %
Neutro Abs: 3.5 10*3/uL (ref 1.7–7.7)
Neutrophils Relative %: 80 %
Platelets: 119 10*3/uL — ABNORMAL LOW (ref 150–400)
RBC: 2.91 MIL/uL — ABNORMAL LOW (ref 3.87–5.11)
RDW: 14.4 % (ref 11.5–15.5)
WBC: 4.5 10*3/uL (ref 4.0–10.5)
nRBC: 0 % (ref 0.0–0.2)

## 2018-07-24 LAB — HEMOGLOBIN AND HEMATOCRIT, BLOOD
HCT: 28.4 % — ABNORMAL LOW (ref 36.0–46.0)
Hemoglobin: 8.9 g/dL — ABNORMAL LOW (ref 12.0–15.0)

## 2018-07-24 LAB — PROTIME-INR
INR: 1.7 — ABNORMAL HIGH (ref 0.8–1.2)
Prothrombin Time: 20 seconds — ABNORMAL HIGH (ref 11.4–15.2)

## 2018-07-24 MED ORDER — HEPARIN (PORCINE) 25000 UT/250ML-% IV SOLN
850.0000 [IU]/h | INTRAVENOUS | Status: DC
  Administered 2018-07-24: 18:00:00 850 [IU]/h via INTRAVENOUS
  Filled 2018-07-24: qty 250

## 2018-07-24 MED ORDER — ORAL CARE MOUTH RINSE
15.0000 mL | Freq: Two times a day (BID) | OROMUCOSAL | Status: DC
  Administered 2018-07-24 – 2018-07-25 (×2): 15 mL via OROMUCOSAL

## 2018-07-24 MED ORDER — MORPHINE SULFATE (PF) 2 MG/ML IV SOLN
2.0000 mg | Freq: Once | INTRAVENOUS | Status: AC
  Administered 2018-07-24: 2 mg via INTRAVENOUS

## 2018-07-24 NOTE — Consult Note (Signed)
   [facility] CM Inpatient Consult   07/24/2018  [name] 1950-06-22 [id number]  Patient screened for extreme high risk score [40%]  for unplanned readmission score  And long length of stay with [facility] Medicare.Checked for [facility] needs.  Review of patient's medical record reveals patient is for skilled nursing facility and being followed by [facility] APS per inpatient TOC SW notes. Notes for unsafe home with self neglect and bedbugs reported.  Patient currently with HFNC noted.  Primary Care Provider: [name], NP, [facility],  this office is listed to provide the transition of care follow up.  Plan: Inpatient TOC notes patient to go to SNF, if she [patient] agrees.  If disposition is for SNF there are no follow up needs at this time.  For questions contact:   Natividad Brood, RN BSN [facility] Liaison  [phone] business mobile phone Toll free office [phone]  Fax number: [phone] [email or web address]

## 2018-07-24 NOTE — TOC Progression Note (Signed)
Transition of Care [facility]) - Progression Note    Patient Details  Name: Eden Toohey MRN: [id number] Date of Birth: [birth year]  Transition of [facility]) CM/SW [place], LCSW Phone Number: 07/24/2018, 8:58 AM  Clinical Narrative:    CSW received call from patient's [facility] APS worker Colletta Maryland [phone] ex [phone]) to get a status update. CSW alerted her that the patient is not medically ready for discharge and therapies had not been able to work with her yet. She would like a call once patient is ready for discharge and to see if patient will consent to SNF placement.     Expected Discharge Plan: [place] Barriers to Discharge: Continued Medical Work up, Unsafe home situation(per report unsafe home situation/self neglect and bed bugs were reports last admission one week ago)  Expected Discharge Plan and Services Expected Discharge Plan: [place]   Discharge Planning Services: CM Consult   Living arrangements for the past 2 months: Single Family Home                                       Social Determinants of Health (SDOH) Interventions    Readmission Risk Interventions Readmission Risk Prevention Plan 07/19/2018 05/29/2018  Transportation Screening - Complete  Medication Review Press photographer) Referral to Pharmacy Complete  PCP or Specialist appointment within 3-5 days of discharge Not Complete -  PCP/Specialist Appt Not Complete comments Continued medical workup -  HRI or Home Care Consult Complete Complete  SW Recovery Care/Counseling Consult Complete Complete  Palliative Care Screening Not Applicable Not Applicable  Skilled Nursing Facility Complete Patient Refused  Some recent data might be hidden

## 2018-07-24 NOTE — Progress Notes (Signed)
1605: Unable to place NGT at this time d/t respiratory status. Patient placed on 10L NRB, sats 70s on HF. Remains SOB and dyspneic. MD paged with above information.

## 2018-07-24 NOTE — Progress Notes (Signed)
ANTICOAGULATION CONSULT NOTE - Follow Up Consult  Pharmacy Consult for Heparin and Warfarin Indication: atrial fibrillation  Allergies  Allergen Reactions  . Dilaudid [Hydromorphone Hcl] Hives and Nausea Only  . Minocycline Hcl     REACTION: Dizzy  . Prednisone     REACTION: feels like throat swelling, hallucinations  . Varenicline Tartrate     REACTION: Dizzy(chantix)   . Zocor [Simvastatin - High Dose] Other (See Comments)    myalgia    Patient Measurements: Height: 5\' 4"  (162.6 cm) Weight: 156 lb 15.5 oz (71.2 kg) IBW/kg (Calculated) : 54.7 Heparin dosing weight:  71 kg  Vital Signs: Temp: 98 F (36.7 C) (07/15 1215) Temp Source: Oral (07/15 1215) BP: 114/57 (07/15 1215) Pulse Rate: 86 (07/15 1215)  Labs: Recent Labs    07/22/18 0520  07/23/18 0415 07/23/18 1825 07/24/18 0437  HGB 8.4*   < > 9.0* 9.3* 8.8*  HCT 26.3*   < > 28.9* 29.2* 28.4*  PLT 126*  --  PLATELET CLUMPS NOTED ON SMEAR, COUNT APPEARS DECREASED  --  119*  LABPROT  --   --  17.4*  --  20.0*  INR  --   --  1.4*  --  1.7*  CREATININE 0.86  --  0.82  --  0.98   < > = values in this interval not displayed.    Estimated Creatinine Clearance: 53.2 mL/min (by C-G formula based on SCr of 0.98 mg/dL).  Assessment:  68 yr old female with hx Factor V Leiden on warfarin PTA which was reversed with [place] and Vitamin K on 07/17/18 d/t coagulapathy. No current plans for EGD per GI so CCM ordered anticoagulation to be restarted on 7/13.  Coumadin 5 mg was ordered on 7/13, but not given  as not yet been cleared for PO meds.  SLP has evaluated on 7/14 and recommended whole meds with liquid.    INR 1.4 > 1.7 after Coumadin 5 mg last night. Last prior was 1.2 on 7/11.  Day #3 Bactrim for Stenotrophomonas pneumonia. May be more sensitive to warfarin doses.   Aspirated last night and now NPO again.  Unable to place NG tube this afternoon due to respiratory status.  Per discussion with Dr. Benny Lennert, will begin IV  heparin. Hgb and platelet count low stable.     PTA warfarin:  5 mg MWFSun, 6 mg TTSat.    Goal of Therapy:  INR 2-3 Heparin level 0.3-0.7 units/ml Monitor platelets by anticoagulation protocol: Yes   Plan:   Begin heparin drip conservatively at 850 units/hr (~12 units/kg/hr)  Heparin level ~8 hours after drip begins.  Daily heparin level, PT/INR and CBC.  No warfarin tonight.  Will follow up for resuming warfarin when tube placed.  Watch for sensitivity to warfarin with concurrent Bactrim.  Arty Baumgartner, [place] Pager: [phone] or phone: [phone] 07/24/2018,4:55 PM

## 2018-07-24 NOTE — Plan of Care (Signed)
  Problem: Clinical Measurements: Goal: Respiratory complications will improve Outcome: Not Progressing  Desats with mild movement. Oxygen demand increased. O2 applied, increased as needed.  Problem: Pain Managment: Goal: General experience of comfort will improve Outcome: Not Progressing  C/O constant pain in abdomen. Medication given as ordered.

## 2018-07-24 NOTE — Care Management Important Message (Signed)
Important Message  Patient Details  Name: [name] MRN: [id number] Date of Birth: Oct 24, 1950   Medicare Important Message Given:  Yes     Orbie Pyo 07/24/2018, 2:13 PM

## 2018-07-24 NOTE — Progress Notes (Signed)
PROGRESS NOTE  [name] QQP:[id number] DOB: 24-Jun-1950 DOA: 07/16/2018 PCP: [name], NP  Brief History   68 year old with Factor V Leiden on warfarin presented with hematemesis. Coded in [facility] ED for 12 minutes. Coagulopathy reversed with Vit K, [place] Transferred to [facility]. Extubated and transferred out  GI has been consulted. Her hemoglobin has been stable and there are currently no plans for EGD. Bronchoscopy was performed on 07/20/2018 that demonstrated a RML mass. Biospy and brushings were taken. Patient was extubated on 07/22/2018.  68 year old female with history of Factor V who presents with GI bleed resulting in cardiac arrest and was reversed.  She coded in [facility] ED for 12 minutes. Coagulopathy reversed with Vit K.  [facility] contacted and the patient was transferred to [facility]. Extubated and transferred out or ICU. Back to ICU 7/11 for mucus plug and lt lung collapse. Pt also underwent thoracentesis on 07/20/2018. Lung was re-expanded.  Bronchoscopy on this date revealed a Right middle lobe mass. Brushings and biopsy was performed.  BAL reveal stenotrophamonus. Patient is receiving IV Bactrim. The patient underwent bronchoscopy again on 07/22/2018. No events overnight following the procedure. She, weaned well and was extubated on 07/22/2018.   Bronch done on Saturday with RML mass, pictures noted.  PCCM will continue to manage.  The patient was transferred to the floor on 07/22/2018. GI has stated that they see no need for EGD and they have signed off. On the evening of 7.14.2020 the patient had an episode of dysphagia with severe cough and increased oxygen requirements. After her diet had been advancd to DYS III, she was again made NPO. SLP was reconsulted, but currently the patient is saturating in the mid nineties on 9L by high flow nasal cannula. Will hold off for now. Pt will need feeing tube placed for now.  The patient has a past medical history of Acute MI [facility]), Arteriosclerotic  cardiovascular disease (ASCVD) (2002), Chronic anticoagulation, Chronic respiratory failure ([place]) (08/27/2013), COPD (chronic obstructive pulmonary disease) ([place]), COPD (chronic obstructive pulmonary disease) ([place]), Factor 5 Leiden mutation, heterozygous ([place]), Factor V Leiden, prothrombin gene mutation ([place]) (2006), Hyperlipidemia, Hypothyroidism, Noncompliance, Pelvic fracture ([place]) (2009), Pulmonary embolism ([place]) (2006), and Tobacco abuse. Consultants  . PCCM . Gastroenterology  Procedures  . Bronchoscopy  Antibiotics   Anti-infectives (From admission, onward)   Start     Dose/Rate Route Frequency Ordered Stop   07/22/18 1000  sulfamethoxazole-trimethoprim (BACTRIM) 320 mg of trimethoprim in dextrose 5 % 500 mL IVPB     320 mg of trimethoprim 346.7 mL/hr over 90 Minutes Intravenous Every 6 hours 07/22/18 0936     07/20/18 2200  piperacillin-tazobactam (ZOSYN) IVPB 3.375 g  Status:  Discontinued     3.375 g 12.5 mL/hr over 240 Minutes Intravenous Every 8 hours 07/20/18 1541 07/22/18 0744   07/20/18 1600  vancomycin (VANCOCIN) 1,500 mg in sodium chloride 0.9 % 500 mL IVPB  Status:  Discontinued     1,500 mg 250 mL/hr over 120 Minutes Intravenous Every 24 hours 07/20/18 1519 07/22/18 0744   07/20/18 1530  piperacillin-tazobactam (ZOSYN) IVPB 3.375 g     3.375 g 100 mL/hr over 30 Minutes Intravenous  Once 07/20/18 1519 07/20/18 1706   07/17/18 0600  Ampicillin-Sulbactam (UNASYN) 3 g in sodium chloride 0.9 % 100 mL IVPB  Status:  Discontinued     3 g 200 mL/hr over 30 Minutes Intravenous Every 6 hours 07/17/18 0536 07/20/18 1349   07/16/18 2345  cefTRIAXone (ROCEPHIN)  2 g in sodium chloride 0.9 % 100 mL IVPB     2 g 200 mL/hr over 30 Minutes Intravenous  Once 07/16/18 2330 07/17/18 0010     .   Subjective  The patient is resting comfortably. She is without new complaints.  Objective   Vitals:  Vitals:   07/24/18 0754 07/24/18 0758  BP:    Pulse:    Resp:    Temp:    SpO2:  97% 99%    Exam:  Constitutional:  . Awake, alert, and oriented x 3. No acute distress. Respiratory:  . No increased work of breathing . No wheezes, rales, or rhonchi. . No tactile fremitus. Cardiovascular:  . Regular rate and rhythm . No murmurs, ectopy, or gallups . No lateral PMI. No thrills. Abdomen:  . Abdomen soft, non-tender, non-distended . Normoactive bowel sounds . No hernias, masses, or organomegaly Musculoskeletal:  . No cyanosis, clubbing, or edema Skin:  . No rashes, lesions, ulcers . palpation of skin: no induration or nodules Neurologic:  . CN 2-12 intact . Sensation all 4 extremities intact Psychiatric:  . Mental status o Mood, affect appropriate o Orientation to person, place, time  . judgment and insight appear intact     I have personally reviewed the following:   Today's Data  . Vitals, BMP, CBC, INR, glucose Imaging  . Bronchoscopy. .   Scheduled Meds: . arformoterol  15 mcg Nebulization BID  . bisacodyl  10 mg Rectal Once  . budesonide (PULMICORT) nebulizer solution  0.5 mg Nebulization BID  . docusate sodium  200 mg Oral Once  . levothyroxine  62.5 mcg Intravenous Daily  . multivitamin with minerals  1 tablet Oral Daily  . pantoprazole (PROTONIX) IV  40 mg Intravenous Q12H  . sodium chloride flush  10-40 mL Intracatheter Q12H  . ticagrelor  60 mg Oral BID  . Warfarin - Pharmacist Dosing Inpatient   Does not apply q1800   Continuous Infusions: . sodium chloride 500 mL (07/24/18 0453)  . lactated ringers Stopped (07/19/18 1615)  . sulfamethoxazole-trimethoprim 320 mg of trimethoprim (07/24/18 1223)    Active Problems:   GI bleed   UGI bleed   Cardiac arrest (HCC)   Endobronchial mass   LOS: 7 days   A & P  Acute on chronic hypoxic respiratory failure: Due to aspiration pneumonia with progressive mucous plugging, left pleural effusion s/p throacetesis and Right middle lobe mass. Repeat bronchoscopy performed on 07/22/2018. Pt  extubated later that day and transferred to the floor.  Dysphagia/aspiration event: The patient's diet was advanced to Dys III on 07/23/2018. That evening the patient had an aspiration event associated with severe coughing and increased oxygen requirements. I will have corpak placed and start tube feeds. SLP is unable to perform MBS currently due to the patient's high O2 requirements.  Pneumonia: Stenatrophamonus has grown out of BAL: continue IV bactrim. Blood cultures have had no growth.  Sepsis: Resolved.   Severe COPD: In exacerbation due to pneumonia as above and right middle lobe mass. Nebulizer treatments as needed.  Upper GI bleed with hematemesis: Pt was taking coumadin and brillinta at home. She cas CAD and a hypercoagulable state due to Factor V Leiden. They have been stopped. GI was consulted. EGD deferred as hemoglobin has stabilized off of anticoagulants and antithrombotics. Monitor. GI has signed off. Continue bid PPI. Possibly for EGD once stable from a respiratory standpoint.  CAD: Hypercoaguable state due to Factor V Leiden. Anticoagulation held.  Hypothyroidism: Continue  Synthroid.  I have seen and examined this patient myself. I have spent 38 minutes in her evaluation and care.  Emmalie Haigh, DO [facility] Direct contact: see [email or web address]  7PM-7AM contact night coverage as above 07/24/2018, 12:57 PM  LOS: 6 days

## 2018-07-24 NOTE — Progress Notes (Signed)
SLP Cancellation Note  Patient Details Name: [name] MRN: [id number] DOB: May 06, 1950   Cancelled treatment:       Reason Eval/Treat Not Completed: Medical issues which prohibited therapy. Patient is at 90% SpO2 on 9L HFNC. MD requested hold off on PO's. Will attempt to see patient next date if she is medically able.    Nadara Mode Tarrell 07/24/2018, 2:58 PM  Sonia Baller, MA, CCC-SLP Speech Therapy [facility] Pager: [phone]

## 2018-07-25 ENCOUNTER — Inpatient Hospital Stay (HOSPITAL_COMMUNITY): Payer: Medicare Other

## 2018-07-25 ENCOUNTER — Other Ambulatory Visit: Payer: Self-pay | Admitting: Internal Medicine

## 2018-07-25 ENCOUNTER — Encounter (HOSPITAL_COMMUNITY): Payer: Self-pay | Admitting: Physician Assistant

## 2018-07-25 DIAGNOSIS — R042 Hemoptysis: Secondary | ICD-10-CM

## 2018-07-25 DIAGNOSIS — R0602 Shortness of breath: Secondary | ICD-10-CM

## 2018-07-25 DIAGNOSIS — J9811 Atelectasis: Secondary | ICD-10-CM

## 2018-07-25 DIAGNOSIS — J810 Acute pulmonary edema: Secondary | ICD-10-CM

## 2018-07-25 LAB — POCT I-STAT 7, (LYTES, BLD GAS, ICA,H+H)
Acid-Base Excess: 11 mmol/L — ABNORMAL HIGH (ref 0.0–2.0)
Acid-Base Excess: 14 mmol/L — ABNORMAL HIGH (ref 0.0–2.0)
Bicarbonate: 37.6 mmol/L — ABNORMAL HIGH (ref 20.0–28.0)
Bicarbonate: 40.6 mmol/L — ABNORMAL HIGH (ref 20.0–28.0)
Calcium, Ion: 1.04 mmol/L — ABNORMAL LOW (ref 1.15–1.40)
Calcium, Ion: 1.02 mmol/L — ABNORMAL LOW (ref 1.15–1.40)
HCT: 29 % — ABNORMAL LOW (ref 36.0–46.0)
HCT: 29 % — ABNORMAL LOW (ref 36.0–46.0)
Hemoglobin: 9.9 g/dL — ABNORMAL LOW (ref 12.0–15.0)
Hemoglobin: 9.9 g/dL — ABNORMAL LOW (ref 12.0–15.0)
O2 Saturation: 83 %
O2 Saturation: 95 %
Patient temperature: 36.9
Patient temperature: 37.3
Potassium: 3.6 mmol/L (ref 3.5–5.1)
Potassium: 3.5 mmol/L (ref 3.5–5.1)
Sodium: 135 mmol/L (ref 135–145)
Sodium: 136 mmol/L (ref 135–145)
TCO2: 40 mmol/L — ABNORMAL HIGH (ref 22–32)
TCO2: 42 mmol/L — ABNORMAL HIGH (ref 22–32)
pCO2 arterial: 64 mmHg — ABNORMAL HIGH (ref 32.0–48.0)
pCO2 arterial: 62.7 mmHg — ABNORMAL HIGH (ref 32.0–48.0)
pH, Arterial: 7.377 (ref 7.350–7.450)
pH, Arterial: 7.421 (ref 7.350–7.450)
pO2, Arterial: 50 mmHg — ABNORMAL LOW (ref 83.0–108.0)
pO2, Arterial: 77 mmHg — ABNORMAL LOW (ref 83.0–108.0)

## 2018-07-25 LAB — COMPREHENSIVE METABOLIC PANEL
ALT: 29 U/L (ref 0–44)
AST: 40 U/L (ref 15–41)
Albumin: 2.1 g/dL — ABNORMAL LOW (ref 3.5–5.0)
Alkaline Phosphatase: 107 U/L (ref 38–126)
Anion gap: 23 — ABNORMAL HIGH (ref 5–15)
BUN: <5 mg/dL — ABNORMAL LOW (ref 8–23)
CO2: 24 mmol/L (ref 22–32)
Calcium: 7.9 mg/dL — ABNORMAL LOW (ref 8.9–10.3)
Chloride: 93 mmol/L — ABNORMAL LOW (ref 98–111)
Creatinine, Ser: 1.1 mg/dL — ABNORMAL HIGH (ref 0.44–1.00)
GFR calc Af Amer: 60 mL/min — ABNORMAL LOW (ref >60)
GFR calc non Af Amer: 52 mL/min — ABNORMAL LOW (ref >60)
Glucose, Bld: 173 mg/dL — ABNORMAL HIGH (ref 70–99)
Potassium: 3.9 mmol/L (ref 3.5–5.1)
Sodium: 140 mmol/L (ref 135–145)
Total Bilirubin: 0.6 mg/dL (ref 0.3–1.2)
Total Protein: 4.9 g/dL — ABNORMAL LOW (ref 6.5–8.1)

## 2018-07-25 LAB — BASIC METABOLIC PANEL
Anion gap: 10 (ref 5–15)
Anion gap: 11 (ref 5–15)
BUN: <5 mg/dL — ABNORMAL LOW (ref 8–23)
BUN: <5 mg/dL — ABNORMAL LOW (ref 8–23)
CO2: 32 mmol/L (ref 22–32)
CO2: 27 mmol/L (ref 22–32)
Calcium: 7.9 mg/dL — ABNORMAL LOW (ref 8.9–10.3)
Calcium: 6.2 mg/dL — CL (ref 8.9–10.3)
Chloride: 95 mmol/L — ABNORMAL LOW (ref 98–111)
Chloride: 69 mmol/L — ABNORMAL LOW (ref 98–111)
Creatinine, Ser: 0.92 mg/dL (ref 0.44–1.00)
Creatinine, Ser: 0.86 mg/dL (ref 0.44–1.00)
GFR calc Af Amer: >60 mL/min (ref >60)
GFR calc Af Amer: >60 mL/min (ref >60)
GFR calc non Af Amer: >60 mL/min (ref >60)
GFR calc non Af Amer: >60 mL/min (ref >60)
Glucose, Bld: 100 mg/dL — ABNORMAL HIGH (ref 70–99)
Glucose, Bld: 1069 mg/dL (ref 70–99)
Potassium: 3.3 mmol/L — ABNORMAL LOW (ref 3.5–5.1)
Potassium: 2.7 mmol/L — CL (ref 3.5–5.1)
Sodium: 137 mmol/L (ref 135–145)
Sodium: 107 mmol/L — CL (ref 135–145)

## 2018-07-25 LAB — BLOOD GAS, ARTERIAL
Acid-base deficit: 3.6 mmol/L — ABNORMAL HIGH (ref 0.0–2.0)
Bicarbonate: 24.3 mmol/L (ref 20.0–28.0)
FIO2: 100
O2 Content: 15 L/min
O2 Saturation: 99.1 %
Patient temperature: 98.6
pCO2 arterial: 74.2 mmHg (ref 32.0–48.0)
pH, Arterial: 7.142 — CL (ref 7.350–7.450)
pO2, Arterial: 318 mmHg — ABNORMAL HIGH (ref 83.0–108.0)

## 2018-07-25 LAB — TROPONIN I (HIGH SENSITIVITY)
Troponin I (High Sensitivity): 14 ng/L (ref <18)
Troponin I (High Sensitivity): 12 ng/L (ref <18)
Troponin I (High Sensitivity): 34 ng/L — ABNORMAL HIGH (ref <18)

## 2018-07-25 LAB — HEPARIN LEVEL (UNFRACTIONATED): Heparin Unfractionated: 0.15 IU/mL — ABNORMAL LOW (ref 0.30–0.70)

## 2018-07-25 LAB — CBC
HCT: 27.5 % — ABNORMAL LOW (ref 36.0–46.0)
HCT: 29.6 % — ABNORMAL LOW (ref 36.0–46.0)
Hemoglobin: 8.5 g/dL — ABNORMAL LOW (ref 12.0–15.0)
Hemoglobin: 9 g/dL — ABNORMAL LOW (ref 12.0–15.0)
MCH: 30.1 pg (ref 26.0–34.0)
MCH: 30.4 pg (ref 26.0–34.0)
MCHC: 30.9 g/dL (ref 30.0–36.0)
MCHC: 30.4 g/dL (ref 30.0–36.0)
MCV: 97.5 fL (ref 80.0–100.0)
MCV: 100 fL (ref 80.0–100.0)
Platelets: 146 10*3/uL — ABNORMAL LOW (ref 150–400)
Platelets: 192 10*3/uL (ref 150–400)
RBC: 2.82 MIL/uL — ABNORMAL LOW (ref 3.87–5.11)
RBC: 2.96 MIL/uL — ABNORMAL LOW (ref 3.87–5.11)
RDW: 14.5 % (ref 11.5–15.5)
RDW: 14.2 % (ref 11.5–15.5)
WBC: 4.5 10*3/uL (ref 4.0–10.5)
WBC: 6.9 10*3/uL (ref 4.0–10.5)
nRBC: 0 % (ref 0.0–0.2)
nRBC: 0.3 % — ABNORMAL HIGH (ref 0.0–0.2)

## 2018-07-25 LAB — PROTIME-INR
INR: 2.8 — ABNORMAL HIGH (ref 0.8–1.2)
INR: 3.1 — ABNORMAL HIGH (ref 0.8–1.2)
Prothrombin Time: 29.3 seconds — ABNORMAL HIGH (ref 11.4–15.2)
Prothrombin Time: 31.6 seconds — ABNORMAL HIGH (ref 11.4–15.2)

## 2018-07-25 LAB — HEMOGLOBIN AND HEMATOCRIT, BLOOD
HCT: 30.5 % — ABNORMAL LOW (ref 36.0–46.0)
Hemoglobin: 9.4 g/dL — ABNORMAL LOW (ref 12.0–15.0)

## 2018-07-25 LAB — CULTURE, BODY FLUID W GRAM STAIN -BOTTLE: Culture: NO GROWTH

## 2018-07-25 LAB — GLUCOSE, CAPILLARY: Glucose-Capillary: 95 mg/dL (ref 70–99)

## 2018-07-25 LAB — APTT: aPTT: 32 seconds (ref 24–36)

## 2018-07-25 MED ORDER — NOREPINEPHRINE 4 MG/250ML-% IV SOLN
0.0000 ug/min | INTRAVENOUS | Status: DC
  Filled 2018-07-25: qty 250

## 2018-07-25 MED ORDER — POTASSIUM CHLORIDE 10 MEQ/50ML IV SOLN
10.0000 meq | INTRAVENOUS | Status: AC
  Administered 2018-07-25 (×2): 10 meq via INTRAVENOUS
  Filled 2018-07-25 (×4): qty 50

## 2018-07-25 MED ORDER — FUROSEMIDE 10 MG/ML IJ SOLN: 80.0000 mg | Freq: Once | INTRAMUSCULAR | Status: DC

## 2018-07-25 MED ORDER — FUROSEMIDE 10 MG/ML IJ SOLN
INTRAMUSCULAR | Status: AC
  Filled 2018-07-25: qty 8

## 2018-07-25 MED ORDER — GABAPENTIN 300 MG/6ML PO SOLN
100.0000 mg | Freq: Three times a day (TID) | ORAL | Status: DC
  Administered 2018-07-25 – 2018-07-30 (×14): 100 mg
  Filled 2018-07-25 (×23): qty 2

## 2018-07-25 MED ORDER — MAGNESIUM SULFATE 2 GM/50ML IV SOLN: 2.0000 g | Freq: Once | INTRAVENOUS | Status: DC

## 2018-07-25 MED ORDER — FUROSEMIDE 10 MG/ML IJ SOLN
80.0000 mg | Freq: Four times a day (QID) | INTRAMUSCULAR | Status: DC
  Administered 2018-07-25: 80 mg via INTRAVENOUS

## 2018-07-25 MED ORDER — WARFARIN - PHARMACIST DOSING INPATIENT: Freq: Every day | Status: DC

## 2018-07-25 MED ORDER — QUETIAPINE FUMARATE 50 MG PO TABS
50.0000 mg | ORAL_TABLET | Freq: Two times a day (BID) | ORAL | Status: DC
  Administered 2018-07-25 – 2018-07-30 (×10): 50 mg
  Filled 2018-07-25 (×10): qty 1

## 2018-07-25 MED ORDER — LIDOCAINE 5 % EX PTCH
1.0000 | MEDICATED_PATCH | CUTANEOUS | Status: DC
  Administered 2018-07-25 – 2018-08-12 (×19): 1 via TRANSDERMAL
  Filled 2018-07-25 (×19): qty 1

## 2018-07-25 MED ORDER — NOREPINEPHRINE 4 MG/250ML-% IV SOLN
0.0000 ug/min | INTRAVENOUS | Status: DC
  Administered 2018-07-25 (×2): 5 ug/min via INTRAVENOUS
  Administered 2018-07-26: 11:00:00 10 ug/min via INTRAVENOUS
  Administered 2018-07-26: 21:00:00 8 ug/min via INTRAVENOUS
  Administered 2018-07-27: 12:00:00 14 ug/min via INTRAVENOUS
  Administered 2018-07-27: 12 ug/min via INTRAVENOUS
  Administered 2018-07-27 – 2018-07-28 (×2): 10 ug/min via INTRAVENOUS
  Administered 2018-07-28: 4 ug/min via INTRAVENOUS
  Administered 2018-07-29: 5.013 ug/min via INTRAVENOUS
  Administered 2018-07-29: 4 ug/min via INTRAVENOUS
  Administered 2018-07-30: 11 ug/min via INTRAVENOUS
  Filled 2018-07-25 (×11): qty 250

## 2018-07-25 MED ORDER — POTASSIUM CHLORIDE 10 MEQ/50ML IV SOLN
10.0000 meq | Freq: Once | INTRAVENOUS | Status: AC
  Administered 2018-07-25: 16:00:00 10 meq via INTRAVENOUS

## 2018-07-25 MED ORDER — FENTANYL CITRATE (PF) 100 MCG/2ML IJ SOLN
25.0000 ug | INTRAMUSCULAR | Status: DC | PRN
  Administered 2018-07-30: 25 ug via INTRAVENOUS
  Filled 2018-07-25: qty 2

## 2018-07-25 MED ORDER — SODIUM BICARBONATE 8.4 % IV SOLN
INTRAVENOUS | Status: AC
  Administered 2018-07-25: 50 meq via INTRAVENOUS
  Filled 2018-07-25: qty 50

## 2018-07-25 MED ORDER — FUROSEMIDE 10 MG/ML IJ SOLN
40.0000 mg | Freq: Once | INTRAMUSCULAR | Status: AC
  Administered 2018-07-25: 15:00:00 40 mg via INTRAVENOUS
  Filled 2018-07-25: qty 4

## 2018-07-25 MED ORDER — HEPARIN (PORCINE) 25000 UT/250ML-% IV SOLN: 700.0000 [IU]/h | INTRAVENOUS | Status: DC

## 2018-07-25 MED ORDER — POTASSIUM CHLORIDE CRYS ER 20 MEQ PO TBCR: 40.0000 meq | EXTENDED_RELEASE_TABLET | Freq: Three times a day (TID) | ORAL | Status: DC

## 2018-07-25 MED ORDER — CHLORHEXIDINE GLUCONATE 0.12% ORAL RINSE (MEDLINE KIT)
15.0000 mL | Freq: Two times a day (BID) | OROMUCOSAL | Status: DC
  Administered 2018-07-25 – 2018-07-31 (×13): 15 mL via OROMUCOSAL

## 2018-07-25 MED ORDER — ORAL CARE MOUTH RINSE
15.0000 mL | OROMUCOSAL | Status: DC
  Administered 2018-07-26 – 2018-07-30 (×43): 15 mL via OROMUCOSAL

## 2018-07-25 MED ORDER — SODIUM BICARBONATE 8.4 % IV SOLN
50.0000 meq | Freq: Once | INTRAVENOUS | Status: AC
  Administered 2018-07-25: 20:00:00 50 meq via INTRAVENOUS

## 2018-07-25 MED ORDER — FENTANYL CITRATE (PF) 100 MCG/2ML IJ SOLN
25.0000 ug | INTRAMUSCULAR | Status: DC | PRN
  Administered 2018-07-25: 50 ug via INTRAVENOUS
  Administered 2018-07-26 – 2018-07-29 (×17): 100 ug via INTRAVENOUS
  Administered 2018-07-30: 15:00:00 50 ug via INTRAVENOUS
  Administered 2018-07-30 (×4): 100 ug via INTRAVENOUS
  Filled 2018-07-25 (×26): qty 2

## 2018-07-25 NOTE — Consult Note (Addendum)
Cardiology Consultation:   Patient ID: Ayodele Hartsock; [id number]; 23-Jul-1950   Admit date: 07/16/2018 Date of Consult: 07/25/2018  Primary Care Provider: [name], NP Primary Cardiologist: Kate Sable, MD Primary Electrophysiologist:  None  Chief Complaint: coughing up or vomiting blood  Patient Profile:   Rim Thatch is a 68 y.o. female with a hx of CAD (inf MI 2002 s/p RCA stent, ISR 2003 s/p CB+brachy therapy for ISR, acute stent thrombosis 06/2010 s/p thrombectomy/Zeta BMS), last cath 2013 with minor CAD, PE with factor V Leiden, ongoing tobacco abuse, COPD and chronic respiratory failure with home O2,  HLD, hypothyroidism, noncompliance, chronic back pain, poor living conditions who is being seen today for the evaluation of pulmonary edema at the request of Dr. Nelda Marseille.  History of Present Illness:   She has had multiple admissions in 2020 with COPD exacerbations, SOB, and a/c respiratory failure. During admission in 05/2018 she was there for AECOPD and also experienced acute blood loss anemia during admission requiring transfusion. EGD was performed and nonrevealing. Colonoscopy showed a 7 mm polyp and 2 clips were applied to achieve hemostasis. Also noted to have hemorrhoids and diverticulosis. She was most recently admitted 6/25-07/11/18 for recurrent respiratory decompensation. She had elevated troponin level to peak 1031 with normal EF 60-65% so medical management was recommended. She refused SNF, and apparently her living situation is too poor for [facility] services to safely come out.  She was admitted initially to [facility] 07/16/18 with large volume hematemesis in the late PM and EMS was called. In the ER she was hypoxemic in the 60s on RA, became unresponsive and suffered PEA arrest with ROSC after about 10-12 minutes. Per notes, she was given epi, bicarb, blood transfusion, IVF, and PCCs in hopes to reverse coagulopathy as well as Protonix and ostreotide. INR was 1.4 but unclear if  this was after she was reversed. GI was consulted who requested discontinuation of blood thinners x 5 days (Brilinta/Warfarin) She has had tumultuous course with extubation then reintubation with left lung collapse and mucus plug with friable endobronchial lesion on bronchoscopy. BAL was positive for Stenotrophomonas Maltophilia >100k colonies requiring antibiotics. She also required thoracentesis, path unrevealing. EGD was deferred given respiratory status with plans to reconsider once more stable. She was felt to need repeat bronchoscopy at some point to re-evaluate endobronchial lesion/mucous in right bronchus intermedius.  Earlier today she developed increasing oxygen demand and recurrent bright red hemoptysis prompting PCCM re-eval. She is on NRB so history is limited. She does indicate having some chest discomfort constant since last night but unable to detail further. She only answers with nodding or pointing. CXR showing pleural effusion, pulm edema, LLL opacity atelectasis vs PNA/aspiration. Cardiology consulted in case of cardiac event driving picture.  On labs she remains anemic at 8.5, hypoalbuminemia 2.0, hypokalemia of 3.3, normal hsTroponin at 14, INR 2.8. Most recent HR 110-120s, currently on NRB, BP 132/75. It appears her coumadin and Brilinta were resumed in the [facility]. She was given one dose of Coumadin on 7/14 and 1 dose of Brilinta on 7/15, otherwise Brilinta held by nursing due to being NPO. It appears her coumadin and Brilinta were resumed in the [facility]. Heparin was stopped. Bedside ED echo on 7/8 reported to show EF 40%.   Past Medical History:  Diagnosis Date   Acute MI ([place])    x4, code blue x3   CAD (coronary artery disease) 2002   Inf STEMI-2002. 2003-cutting balloon + brachytherapy for restenosis; subsequent acute stent thrombosis  06/2010 requiring 2 separate interventions (Zeta stent, then repeat cath with thrombectomy). focal basal inf AK, nl EF; 03/2011: Patent stents, minor  nonobst  residual dz, nl EF; neg stress nuclear in 2008 and stress echo in 2009   Chronic anticoagulation    Warfarin plus ticagrelor   Chronic respiratory failure ([place]) 08/27/2013   On 2L 02   COPD (chronic obstructive pulmonary disease) ([place])    02 dependent   Factor 5 Leiden mutation, heterozygous ([place])    Factor V Leiden, prothrombin gene mutation ([place]) 2006   Hyperlipidemia    Hypothyroidism    Noncompliance    Pelvic fracture ([place]) 2009   Pulmonary embolism ([place]) 2006   Associated with deep vein thrombosis-2006; + factor V Leiden   Tobacco abuse    50 pack years    Past Surgical History:  Procedure Laterality Date   COLONOSCOPY  Approximately 2000   Negative screening study   COLONOSCOPY WITH PROPOFOL N/A 05/28/2018   Procedure: COLONOSCOPY WITH PROPOFOL;  Surgeon: Rogene Houston, MD;  [facility];  Service: Endoscopy;  Laterality: N/A;   CORONARY ANGIOPLASTY  2002, 2003, 2012   ESOPHAGOGASTRODUODENOSCOPY (EGD) WITH PROPOFOL N/A 05/26/2018   Procedure: ESOPHAGOGASTRODUODENOSCOPY (EGD) WITH PROPOFOL;  Surgeon: Rogene Houston, MD;  Location: [facility];  Service: Endoscopy;  Laterality: N/A;   LEFT AND RIGHT HEART CATHETERIZATION WITH CORONARY ANGIOGRAM N/A 04/03/2011   Procedure: LEFT AND RIGHT HEART CATHETERIZATION WITH CORONARY ANGIOGRAM;  Surgeon: Sherren Mocha, MD;  Location: [facility] CATH LAB;  Service: Cardiovascular;  Laterality: N/A;   POLYPECTOMY  05/28/2018   Procedure: POLYPECTOMY;  Surgeon: Rogene Houston, MD;  Location: [facility];  Service: Endoscopy;;  cold snare and biopsy forcep     Inpatient Medications: Scheduled Meds:  arformoterol  15 mcg Nebulization BID   budesonide (PULMICORT) nebulizer solution  0.5 mg Nebulization BID   furosemide  80 mg Intravenous Q6H   levothyroxine  62.5 mcg Intravenous Daily   lidocaine  1 patch Transdermal Q24H   mouth rinse  15 mL Mouth Rinse BID   multivitamin with minerals  1  tablet Oral Daily   pantoprazole (PROTONIX) IV  40 mg Intravenous Q12H   sodium chloride flush  10-40 mL Intracatheter Q12H   ticagrelor  60 mg Oral BID   Warfarin - Pharmacist Dosing Inpatient   Does not apply q1800   Continuous Infusions:  sodium chloride Stopped (07/24/18 1747)   potassium chloride 10 mEq (07/25/18 1717)   sulfamethoxazole-trimethoprim 320 mg of trimethoprim (07/25/18 1713)   PRN Meds: sodium chloride, albuterol, morphine injection, ondansetron (ZOFRAN) IV, sodium chloride flush  Home Meds: Prior to Admission medications   Medication Sig Start Date End Date Taking? Authorizing Provider  albuterol (PROVENTIL) (2.5 MG/3ML) 0.083% nebulizer solution USE 1 VIAL BY NEBULIZER EVERY 4 HOURS AS NEEDED FOR WHEEZING. DX: J44.9 Patient taking differently: Take 2.5 mg by nebulization every 4 (four) hours as needed for wheezing or shortness of breath.  01/24/17  Yes Magdalen Spatz, NP  arformoterol (BROVANA) 15 MCG/2ML NEBU Take 2 mLs (15 mcg total) by nebulization 2 (two) times daily. 09/07/17  Yes Juanito Doom, MD  baclofen (LIORESAL) 10 MG tablet Take 1 tablet (10 mg total) by mouth 3 (three) times daily. 01/24/18  Yes Lavina Hamman, MD  bisacodyl (DULCOLAX) 5 MG EC tablet Take 1 tablet (5 mg total) by mouth daily as needed for mild constipation. 05/29/18  Yes Shah, Pratik D, DO  bisoprolol (ZEBETA)  5 MG tablet Take 0.5 tablets (2.5 mg total) by mouth daily. 07/12/18 08/11/18 Yes Shah, Pratik D, DO  budesonide (PULMICORT) 0.5 MG/2ML nebulizer solution Take 2 mLs (0.5 mg total) by nebulization 2 (two) times daily. Dx: J43.9 01/24/17  Yes Magdalen Spatz, NP  buPROPion [facility] SR) 100 MG 12 hr tablet Take 100 mg by mouth every morning.   Yes [provider]  citalopram (CELEXA) 10 MG tablet Take 10 mg by mouth daily.  04/29/18  Yes [provider]  enoxaparin (LOVENOX) 120 MG/0.8ML injection Inject 0.73 mLs (110 mg total) into the skin daily for 10 days.  07/11/18 07/21/18 Yes Shah, Pratik D, DO  Glycerin-Hypromellose-PEG 400 (DRY EYE RELIEF DROPS) 0.2-0.2-1 % SOLN Place 1-2 drops into both eyes 2 (two) times a day.   Yes [provider]  HYDROcodone-acetaminophen (NORCO/VICODIN) 5-325 MG tablet Take 1 tablet by mouth every 6 (six) hours as needed (BREAKTHROUGH PAIN). 02/19/18  Yes Johnson, Clanford L, MD  ipratropium-albuterol (DUONEB) 0.5-2.5 (3) MG/3ML SOLN Take 3 mLs by nebulization 4 (four) times daily. 03/20/17  Yes Magdalen Spatz, NP  levothyroxine (SYNTHROID, LEVOTHROID) 125 MCG tablet Take 1 tablet (125 mcg total) by mouth daily. 08/16/17  Yes Nafziger, Tommi Rumps, NP  nitroGLYCERIN (NITROSTAT) 0.4 MG SL tablet Place 1 tablet (0.4 mg total) under the tongue every 5 (five) minutes as needed for chest pain. 10/28/14  Yes Herminio Commons, MD  Omega-3 Fatty Acids (FISH OIL) 600 MG CAPS Take 1 capsule by mouth daily.    Yes [provider]  pantoprazole (PROTONIX) 40 MG tablet Take 1 tablet (40 mg total) by mouth daily. Patient taking differently: Take 40 mg by mouth daily as needed (reflux/heartburn).  02/20/18  Yes Johnson, Clanford L, MD  rosuvastatin (CRESTOR) 5 MG tablet Take 1 tablet (5 mg total) by mouth daily at 6 PM. 07/11/18 08/10/18 Yes Shah, Pratik D, DO  ticagrelor (BRILINTA) 60 MG TABS tablet Take 1 tablet (60 mg total) by mouth 2 (two) times daily. 02/01/18  Yes Johnson, Clanford L, MD  traZODone (DESYREL) 50 MG tablet Take 50 mg by mouth at bedtime as needed for sleep.   Yes [provider]  warfarin (COUMADIN) 1 MG tablet Take 5-6 mg by mouth See admin instructions. On Sunday, Tuesday, Thursday, and Saturday, patient takes 108m; on all other days, patient takes 550m  Yes [provider]  ferrous sulfate 325 (65 FE) MG tablet Take 1 tablet (325 mg total) by mouth daily with breakfast. Patient not taking: Reported on 07/19/2018 07/11/18 08/10/18  ShHeath Lark, DO  Respiratory Therapy Supplies (FLUTTER) DEVI Use as  directed 05/14/15   NeJavier GlazierMD    Allergies:    Allergies  Allergen Reactions   Dilaudid [Hydromorphone Hcl] Hives and Nausea Only   Minocycline Hcl     REACTION: Dizzy   Prednisone     REACTION: feels like throat swelling, hallucinations   Varenicline Tartrate     REACTION: Dizzy(chantix)    Zocor [Simvastatin - High Dose] Other (See Comments)    myalgia    Social History:   Social History   Socioeconomic History   Marital status: Single    Spouse name: Not on file   Number of children: Not on file   Years of education: Not on file   Highest education level: Not on file  Occupational History   Occupation: Disabled   Occupation: Truck drSolicitortrain: Patient  refused   Food insecurity    Worry: Patient refused    Inability: Patient refused   Transportation needs    Medical: Patient refused    Non-medical: Patient refused  Tobacco Use   Smoking status: Current Some Day Smoker    Packs/day: 0.50    Years: 50.00    Pack years: 25.00    Types: Cigarettes    Start date: 05/10/1968   Smokeless tobacco: Never Used  Substance and Sexual Activity   Alcohol use: No    Alcohol/week: 0.0 standard drinks   Drug use: No   Sexual activity: Not on file  Lifestyle   Physical activity    Days per week: Patient refused    Minutes per session: Patient refused   Stress: Not on file  Relationships   Social connections    Talks on phone: Patient refused    Gets together: Patient refused    Attends religious service: Patient refused    Active member of club or organization: Patient refused    Attends meetings of clubs or organizations: Patient refused    Relationship status: Patient refused   Intimate partner violence    Fear of current or ex partner: Patient refused    Emotionally abused: Patient refused    Physically abused: Patient refused    Forced sexual activity: Patient refused  Other Topics Concern     Not on file  Social History Narrative   Originally from [place]. Previously has lived in [place], [place], [place] & moved back to [place] in 1994. 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 Ronie Spies was in her current home. She also had an owl living in her house as a rehab pet. She also had chickens until Fall 2015. No mold exposure.     Family History:   The patient's family history includes Alzheimer's disease in her mother; Emphysema in her mother and sister; Factor V Leiden deficiency in her daughter, father, and sister; Heart disease in her father; Kidney cancer in her paternal grandmother.  ROS:  Please see the history of present illness.  All other ROS reviewed and negative.     Physical Exam/Data:   Vitals:   07/25/18 0725 07/25/18 0900 07/25/18 1445 07/25/18 1629  BP: (!) 164/68  132/75 (!) 118/42  Pulse: (!) 112 88 (!) 125 96  Resp: 17  (!) 27 18  Temp:   98.5 F (36.9 C)   TempSrc:   Axillary   SpO2: 100% 100% 97% 93%  Weight:      Height:        Intake/Output Summary (Last 24 hours) at 07/25/2018 1729 Last data filed at 07/25/2018 1624 Gross per 24 hour  Intake 4376.87 ml  Output 650 ml  Net 3726.87 ml   Last 3 Weights 07/25/2018 07/24/2018 07/22/2018  Weight (lbs) 162 lb 7.7 oz 156 lb 15.5 oz 167 lb 8.8 oz  Weight (kg) 73.7 kg 71.2 kg 76 kg    Body mass index is 27.89 kg/m.  General: Ill appearing WF in no acute distress on NRB. Flecks of remnants of BRB are seen on inside of NRB mask Head: Normocephalic, atraumatic, sclera non-icteric, no xanthomas, nares are without discharge Neck: Negative for carotid bruits. JVD not elevated. Lungs: Diffusely diminshed and coarse throughout, no wheezes. Difficult to appreciate any rales or rhonchi. Breathing is mildly labored. Heart: RRR, distant heart sounds, with S1 S2. No murmurs, rubs, or  gallops appreciated. Abdomen: Soft, non-tender, non-distended with normoactive  bowel sounds. No hepatomegaly. No rebound/guarding. No obvious abdominal masses. Msk:  Strength and tone appear normal for age. Extremities: No clubbing or cyanosis. No edema.  Distal pedal pulses are 2+ and equal bilaterally. Neuro: Alert and oriented for the moment but seems easy to close her eyes. No facial asymmetry. No focal deficit. Moves all extremities spontaneously. Psych: Stoic affect  EKG:  The EKG was personally reviewed and demonstrates sinus tachcyardia 101bpm, sinus tachycardia with frequent PVCs, TWI inferiorly, cannot exclude prior anterior infarct   Laboratory Data:  Chemistry Recent Labs  Lab 07/23/18 0415 07/24/18 0437 07/25/18 0336  NA 138 138 137  K 3.6 3.2* 3.3*  CL 102 97* 95*  CO2 29 32 32  GLUCOSE 93 82 100*  BUN 7* <5* <5*  CREATININE 0.82 0.98 0.92  CALCIUM 7.9* 7.9* 7.9*  GFRNONAA >60 59* >60  GFRAA >60 >60 >60  ANIONGAP _0 Recent Labs  Lab 07/20/18 1154 07/24/18 0437  PROT 5.0* 4.7*  ALBUMIN 2.1* 2.0*  AST  --  17  ALT  --  23  ALKPHOS  --  93  BILITOT  --  0.5   Hematology Recent Labs  Lab 07/23/18 0415  07/24/18 0437 07/24/18 1738 07/25/18 0336 07/25/18 1655  WBC 6.9  --  4.5  --  4.5  --   RBC 2.95*  --  2.91*  --  2.82*  --   HGB 9.0*   < > 8.8* 8.9* 8.5* 9.4*  HCT 28.9*   < > 28.4* 28.4* 27.5* 30.5*  MCV 98.0  --  97.6  --  97.5  --   MCH 30.5  --  30.2  --  30.1  --   MCHC 31.1  --  31.0  --  30.9  --   RDW 14.7  --  14.4  --  14.5  --   PLT PLATELET CLUMPS NOTED ON SMEAR, COUNT APPEARS DECREASED  --  119*  --  146*  --    < > = values in this interval not displayed.   Cardiac EnzymesNo results for input(s): TROPONINI in the last 168 hours. No results for input(s): TROPIPOC in the last 168 hours.  BNP Recent Labs  Lab 07/20/18 1059  BNP 565.2*    DDimer No results for input(s): DDIMER in the last 168 hours.  Radiology/Studies:  Dg Chest Port 1 View  Result Date: 07/25/2018 CLINICAL DATA:  Shortness of  breath.  History of pulmonary embolism. EXAM: PORTABLE CHEST 1 VIEW COMPARISON:  July 25, 2018 FINDINGS: Diffuse interstitial opacities. Small right effusion. Effusion and opacity in left base is similar to mildly worsened. The right PICC line is in stable position. Nipple shadows seen on the right. No other acute abnormalities. IMPRESSION: 1. Effusion and opacity in left base has worsened. The underlying opacity could be atelectasis. However, the findings are at least somewhat concerning for pneumonia or aspiration. Recommend clinical correlation. 2. Small right pleural effusion. 3. Mild pulmonary edema. Electronically Signed   By: Dorise Bullion III M.D   On: 07/25/2018 15:13   Dg Chest Port 1 View  Result Date: 07/25/2018 CLINICAL DATA:  Recent CPR.  Shortness of breath and hemoptysis EXAM: PORTABLE CHEST 1 VIEW COMPARISON:  June 22, 2018 FINDINGS: Endotracheal tube and nasogastric tube have been removed. Central catheter tip is in the superior vena cava. No pneumothorax. There is airspace consolidation in the left lower lobe  with left pleural effusion and atelectasis. There is a small right pleural effusion with mild right base atelectasis. There is underlying interstitial thickening which may represent mild interstitial edema. There is cardiomegaly with pulmonary venous hypertension. No adenopathy. There is aortic atherosclerosis. No bone lesions evident. IMPRESSION: Left lower lobe airspace consolidation consistent with pneumonia or aspiration. There is a small left pleural effusion. There is also a small right pleural effusion. There are areas of atelectatic change in each lower lobe. There is pulmonary vascular congestion with apparent mild interstitial edema. There may be a degree of superimposed congestive heart failure. Central catheter tip in superior vena cava. Aortic Atherosclerosis (ICD10-I70.0). Electronically Signed   By: Lowella Grip III M.D.   On: 07/25/2018 08:16   Dg Chest Port 1  View  Result Date: 07/22/2018 CLINICAL DATA:  68 year old female with respiratory failure EXAM: PORTABLE CHEST 1 VIEW COMPARISON:  Prior chest x-ray 07/21/2018 FINDINGS: The endotracheal tube is 2.2 cm above the carina. Interval placement of a right upper extremity PICC. The catheter tip overlies the mid SVC. A gastric tube is present, the tip lies off the field of view, below the diaphragm and presumably within the stomach. No significant interval change in the appearance of the lungs. Persistent background chronic bronchitic changes. Bibasilar airspace opacities are again evident more confluent on the left than the right. The left lower lobe airspace opacity may reflect a combination of pleural fluid and atelectasis or infiltrate. On the right, there is likely a small effusion combined with atelectasis. No pneumothorax. No acute osseous abnormality. IMPRESSION: 1. New right upper extremity PICC. Catheter tip projects over the mid SVC. 2. The tip of the endotracheal tube is 2.2 cm above the carina. 3. Persistent dense left basilar opacity likely representing a combination of pleural effusion and atelectasis versus infiltrate. 4. Small right pleural effusion with associated atelectasis. 5. Chronic bronchitic changes. Electronically Signed   By: Jacqulynn Cadet M.D.   On: 07/22/2018 07:44    Assessment and Plan:   1. Acute on chronic respiratory failure with underlying severe COPD with hematemesis/hemoptysis (both documented this admission), RML mass, stenatrophamonus PNA and dysphagia/aspiration event - PCCM on board. Her recurrent readmissions, complicated by competing complications, portend a poor prognosis. Do not see prior discussion of palliative GOC, which should be considered.  2. Pulmonary edema/effusions on CXR - ? Acute systolic CHF - EF 84% by bedside ED echo 7/8. Agree with need for diuresis. Would benefit from formal echocardiogram as well. Will review further plans with MD. Her clinical  status appears very tenuous.  3. UGIB - GI has deferred EGD for now, possibly to revisit when more stable. Question whether this actually represented hematemesis being vomited.  4. Significant h/o CAD on longterm Brilinta due to h/o ISR/stent thrombosis - hsTroponins repeatedly negative this admission arguing against ACS causing such dramatic decompensation. Will discuss Brilinta rx with Dr. Margaretann Loveless.  5. H/o Factor V Leiden/hypercoag state - anticoagulation management per primary teams. Difficult situation given bleeding issues this admission and anemia.  For questions or updates, please contact [place] Please consult [email or web address] for contact info under Cardiology/STEMI.    Signed, Charlie Pitter, PA-C  07/25/2018 5:29 PM   ---------------------------------------------------------------------------------------------   History and all data above reviewed.  Patient examined.  I agree with the findings as above.  Cyndie Woodbeck is a 68 yo female with a very complex past medical history with multiple admissions this year who presents after PEA arrest and hemoptysis. Cardiology was consulted  to assist with worsening respiratory status and pulmonary edema.   Constitutional: Alert, no distress. On BiPAP. Myoclonus seen at times. ENMT: BiPAP in place Cardiovascular: irregular rhythm, normal rate, no murmurs. No jugular venous distention.  Respiratory: coarse diffusely and bilaterally GI : normal bowel sounds, soft and nontender. No distention.   MSK: extremities warm, well perfused. No edema.  NEURO: Myoclonus seen intermittently. Non focal. PSYCH: alert and oriented x 3  All available labs, radiology testing, previous records reviewed. Agree with documented assessment and plan of my colleague as stated above with the following additions or changes:  Active Problems:   GI bleed   UGI bleed   Cardiac arrest [facility])   Endobronchial mass    Plan:  This is an extremely complex  situation.  Thank you for inviting [place] to participate in the patient's care.  The most pressing issue in the patient's care is her repeated admissions in 2020 for COPD exacerbations with respiratory failure.  She experienced another respiratory decompensation with evidence of pleural effusion and pulmonary edema.  In addition she had hemoptysis with bright red blood, and on a previous bronchoscopy has been noted to have a friable right middle lobe lesion.  While she has a history of coronary artery disease and has been continued on indefinite Brilinta, her pulmonary issues seem to be the primary driver of her illness at this time.  Addressing the questions for cardiology as follows: Antiplatelet therapy- the patient has been continued on Brilinta for complex coronary artery disease, she is only received 1 dose in the past several days.  It would certainly seem reasonable in the setting of bright red blood hemoptysis to discontinue Brilinta at this time.  If she were to make a recovery and regain her functional status, reinitiation could be considered, however it is unlikely given her significant morbidity and repeated hospitalizations that she will be low risk from a bleeding standpoint in the near future.  In addition she has a concerning lesion in her right middle lobe which seems to be bleeding as well.  For these reasons Brilinta can be held with the understanding that she has a high risk coronary history and may be subject to increased risk of cardiovascular events. If all parties agree that this is an acceptable risk in the setting of acute bleeding, Brilinta can be held.   Pulmonary edema/pleural effusion- question is raised about cardiovascular status in the setting of question acute pulmonary edema and pleural effusions.  I performed a bedside echocardiogram which demonstrates a preserved ejection fraction of approximately 55%.  She had very recently had an echocardiogram with similar findings.  Outside of acute diastolic dysfunction or mitral chordal rupture, it is unlikely that pulmonary edema is from primary cardiac process. However, if the patient fails to improve, could consider repeat very limited echocardiogram for valvular function.   Chest pain - guarded management with strong contraindications to intervention or angiography. Fortunately LV function appears preserved (with likely old regional wall motion abnormalities inferiorly on bedside echo) and troponins are relatively unremarkable this admission.   I would strongly recommend a palliative medicine consultation and I have discussed this with Dr. Marthenia Rolling. The patient has multiple high risk admissions, frequent decompensations and contraindications to recommended therapy due to overall medical illness. Goals of care need to be addressed this admission. We are happy to participate in discussions as needed.  Length of Stay:  LOS: 8 days   Elouise Munroe, MD [facility] 6:08 PM  07/25/2018

## 2018-07-25 NOTE — Progress Notes (Deleted)
ANTICOAGULATION CONSULT NOTE - Follow Up Consult  Pharmacy Consult for Heparin Indication: ACS / Factor V Leiden / Afib  Allergies  Allergen Reactions  . Dilaudid [Hydromorphone Hcl] Hives and Nausea Only  . Minocycline Hcl     REACTION: Dizzy  . Prednisone     REACTION: feels like throat swelling, hallucinations  . Varenicline Tartrate     REACTION: Dizzy(chantix)   . Zocor [Simvastatin - High Dose] Other (See Comments)    myalgia    Patient Measurements: Height: 5\' 4"  (162.6 cm) Weight: 162 lb 7.7 oz (73.7 kg) IBW/kg (Calculated) : 54.7  Vital Signs: Temp: 98.3 F (36.8 C) (07/16 1744) Temp Source: Axillary (07/16 1744) BP: 123/69 (07/16 1744) Pulse Rate: 95 (07/16 1741)  Labs: Recent Labs    07/23/18 0415  07/24/18 0437  07/25/18 0336 07/25/18 1605 07/25/18 1655 07/25/18 1825 07/25/18 1949  HGB 9.0*   < > 8.8*   < > 8.5*  --  9.4*  --  9.0*  HCT 28.9*   < > 28.4*   < > 27.5*  --  30.5*  --  29.6*  PLT PLATELET CLUMPS NOTED ON SMEAR, COUNT APPEARS DECREASED  --  119*  --  146*  --   --   --  192  LABPROT 17.4*  --  20.0*  --  29.3*  --   --   --   --   INR 1.4*  --  1.7*  --  2.8*  --   --   --   --   HEPARINUNFRC  --   --   --   --  0.15*  --   --   --   --   CREATININE 0.82  --  0.98  --  0.92  --   --  0.86  --   TROPONINIHS  --   --   --   --   --  14  --  12  --    < > = values in this interval not displayed.    Estimated Creatinine Clearance: 61.6 mL/min (by C-G formula based on SCr of 0.86 mg/dL).  Assessment: 68 yr old female with hx Factor V Leiden on warfarin PTA which was reversed with [place] and Vitamin K on 07/17/18 d/t coagulapathy. No current plans for EGD per GI so CCM ordered anticoagulation to be restarted on 7/13.  Warfarin 5 mg was ordered on 7/13, but unable to take due to NPO.  Warfarin 5 mg given 7/14 pm, then held again on 7/15 as NPO and unable to place NG tube due to respiratory status.  IV heparin begun 7/15 pm, then stopped this am  due to hemoptysis and INR up to 2.8, likely due to concurrent Bactrim IV for Stenotrophomonas pneumonia.    S/p code tonight, questionable new ACS event, to restart heparin at low rate despite INR of 2.8  Goal of Therapy:  Heparin level 0.3-0.5 units/ml Monitor platelets by anticoagulation protocol: Yes   Plan:  Heparin at 700 units / hr 8 hour heparin level Daily labs  Thank you  Anette Guarneri, PharmD [phone]  07/25/2018,8:10 PM

## 2018-07-25 NOTE — Progress Notes (Signed)
  Speech Language Pathology Treatment: Dysphagia  Patient Details Name: [name] MRN: [id number] DOB: 03/16/1950 Today's Date: 07/25/2018 Time: 6789-3810 SLP Time Calculation (min) (ACUTE ONLY): 23 min  Assessment / Plan / Recommendation Clinical Impression  Patient had a rapid response call this AM due to O2 sats dropping to 70. RN requested ST to return as pt has improved. Pt is currently on 8L O2 High flow nasal canula, RR 25-30. Pt's cough is weak and congested. After sips of water she coughed up red bloody secretions to her mouth. Trials of ice chips, water from cup and straw and puree given. No over s/sx of aspiration, however pt has a difficult time coordinating mastication with breathing and swallowing. Her RR increased as the session progressed. Concerned pt is at risk of aspiration due to respiratory status at this time. Recommend pt remain NPO. ST will continue to follow for readiness for PO.   HPI HPI: Patient is a 68 y.o. female with PMH: COPD, chronic respiratory failure on home O2, hypothyroidism, CAD s/p PCI, who was brought to hospital with SOB and questionable hematemesis, had 12 mins PEA after cardiac arrest in [facility] ED, then transferred to [facility], started on PPI and GI consulted, and antibiotics started for aspiration PNA. She was intubated on 7/7 extubated on 7/9. She vomited on arrival to Med surgical unit and 7/11 had left lung collapse s/p thoracentesis, intubated again, bronchoscopy. She was extubated on 7/13 to nasal cannula.      SLP Plan          Recommendations  Diet recommendations: NPO Medication Administration: Via alternative means                Oral Care Recommendations: Oral care BID Follow up Recommendations: None SLP Visit Diagnosis: Dysphagia, unspecified (R13.10)       [place], MA, CCC-SLP 07/25/2018 1:37 PM

## 2018-07-25 NOTE — Progress Notes (Signed)
SLP Cancellation Note  Patient Details Name: Madiline Saffran MRN: [id number] DOB: Nov 24, 1950   Cancelled treatment:        Medical issues, decline in respiratory status. Will re-attempt at next available time.    Charlynne Cousins Emry Barbato, MA, CCC-SLP 07/25/2018 8:58 AM

## 2018-07-25 NOTE — Progress Notes (Signed)
PT Cancellation Note  Patient Details Name: [name] MRN: [id number] DOB: 1950/10/04   Cancelled Treatment:    Reason Eval/Treat Not Completed: Medical issues which prohibited therapy Pt with decline in respiratory status. Will follow up as schedule allows.   Leighton Ruff, PT, DPT  Acute Rehabilitation Services  Pager: [phone] Office: [phone]    [name] 07/25/2018, 12:25 PM

## 2018-07-25 NOTE — Significant Event (Addendum)
Rapid Response Event Note  Overview: Time Called: 7591 Arrival Time: 1435 Event Type: Respiratory  Initial Focused Assessment: Paged to room for pt desating after receiving bath  Interventions: Repositioned pt in bed. MD placed orders for CXR and 40 mg Lasix IVP. Minimal results from Lasix. Request consult with CCM. Dr. Nelda Marseille to bedside, 80 mg Lasix and transfer to 2c   Event Summary: Name of Physician Notified: Dr. Marthenia Rolling at (PTA RRT)  Name of Consulting Physician Notified: Dr. Nelda Marseille at    Outcome: Transferred (Comment)  Event End Time: 1748  [place] Pickup

## 2018-07-25 NOTE — Progress Notes (Signed)
ABG collected by RN

## 2018-07-25 NOTE — Progress Notes (Signed)
RT responded to code blue page.  Upon arrival patient was on non-rebreather mask with sats of 98%.  Per RN, patient's sats had dropped to 80s.  Upon arrival patient was alert however noted to have diminished breath sounds in upper lobes and coarse in the lower lobes.  Patient given AM nebulizers.  Also noted that patient had coughed up bright red fresh blood.  Chest xray ordered.  Patient's work of breathing still slightly increased.  MD was paged.  Will continue to monitor and wait for further instructions.

## 2018-07-25 NOTE — Progress Notes (Signed)
Attending notified about pt's hemoptysis episode / desaturation episode. Hepatin gtt stopped

## 2018-07-25 NOTE — Progress Notes (Signed)
PROGRESS NOTE  [name] LZJ:[id number] DOB: 07-Sep-1950 DOA: 07/16/2018 PCP: [name], NP  Brief History   68 year old with Factor V Leiden on warfarin presented with hematemesis. Coded in [facility] ED for 12 minutes. Coagulopathy reversed with Vit K, [place] Transferred to [facility]. Extubated and transferred out  GI has been consulted. Her hemoglobin has been stable and there are currently no plans for EGD. Bronchoscopy was performed on 07/20/2018 that demonstrated a RML mass. Biospy and brushings were taken. Patient was extubated on 07/22/2018.  68 year old female with history of Factor V who presents with GI bleed resulting in cardiac arrest and was reversed.  She coded in [facility] ED for 12 minutes. Coagulopathy reversed with Vit K.  [facility] contacted and the patient was transferred to [facility]. Extubated and transferred out or ICU. Back to ICU 7/11 for mucus plug and lt lung collapse. Pt also underwent thoracentesis on 07/20/2018. Lung was re-expanded.  Bronchoscopy on this date revealed a Right middle lobe mass. Brushings and biopsy was performed.  BAL reveal stenotrophamonus. Patient is receiving IV Bactrim. The patient underwent bronchoscopy again on 07/22/2018. No events overnight following the procedure. She, weaned well and was extubated on 07/22/2018.   Bronch done on Saturday with RML mass, pictures noted.  PCCM will continue to manage.  The patient was transferred to the floor on 07/22/2018. GI has stated that they see no need for EGD and they have signed off. On the evening of 7.14.2020 the patient had an episode of dysphagia with severe cough and increased oxygen requirements. After her diet had been advancd to DYS III, she was again made NPO. SLP was reconsulted, but currently the patient is saturating in the mid nineties on 9L by high flow nasal cannula. Will hold off for now. Pt will need feeing tube placed for now.  The patient has a past medical history of Acute MI [facility]), Arteriosclerotic  cardiovascular disease (ASCVD) (2002), Chronic anticoagulation, Chronic respiratory failure ([place]) (08/27/2013), COPD (chronic obstructive pulmonary disease) ([place]), COPD (chronic obstructive pulmonary disease) ([place]), Factor 5 Leiden mutation, heterozygous ([place]), Factor V Leiden, prothrombin gene mutation ([place]) (2006), Hyperlipidemia, Hypothyroidism, Noncompliance, Pelvic fracture ([place]) (2009), Pulmonary embolism ([place]) (2006), and Tobacco abuse.  07/25/2018: Patient has had 2 episodes of significant respiratory distress, with O2 sat dropping to the 70s and patient requiring non-breather.  2 chest x-rays done today reveal possible pulmonary edema, atelectasis versus pneumonic process.  Hemoptysis reported earlier.  Heparin has been discontinued.  INR today is 2.8.  Patient is still n.p.o.  Patient was on warfarin and heparin up until 2 days ago.  Pharmacy is dosing warfarin, but none for now.  Critical care was consulted, and critical care input is appreciated.  Patient be transferred to [facility] (stepdown) as per critical care recommendation.  Consulted cardiology as this could be flash pulmonary edema/angina equivalent.  History of recent cardiac arrest noted.  Troponin is pending.  Patient has been aggressively diuresed.  Will start patient on BiPAP.  Further management depend on hospital course. Consultants  . PCCM . Gastroenterology  Procedures  . Bronchoscopy  Antibiotics   Anti-infectives (From admission, onward)   Start     Dose/Rate Route Frequency Ordered Stop   07/22/18 1000  sulfamethoxazole-trimethoprim (BACTRIM) 320 mg of trimethoprim in dextrose 5 % 500 mL IVPB     320 mg of trimethoprim 346.7 mL/hr over 90 Minutes Intravenous Every 6 hours 07/22/18 0936     07/20/18 2200  piperacillin-tazobactam (ZOSYN) IVPB  3.375 g  Status:  Discontinued     3.375 g 12.5 mL/hr over 240 Minutes Intravenous Every 8 hours 07/20/18 1541 07/22/18 0744   07/20/18 1600  vancomycin (VANCOCIN) 1,500 mg in sodium  chloride 0.9 % 500 mL IVPB  Status:  Discontinued     1,500 mg 250 mL/hr over 120 Minutes Intravenous Every 24 hours 07/20/18 1519 07/22/18 0744   07/20/18 1530  piperacillin-tazobactam (ZOSYN) IVPB 3.375 g     3.375 g 100 mL/hr over 30 Minutes Intravenous  Once 07/20/18 1519 07/20/18 1706   07/17/18 0600  Ampicillin-Sulbactam (UNASYN) 3 g in sodium chloride 0.9 % 100 mL IVPB  Status:  Discontinued     3 g 200 mL/hr over 30 Minutes Intravenous Every 6 hours 07/17/18 0536 07/20/18 1349   07/16/18 2345  cefTRIAXone (ROCEPHIN) 2 g in sodium chloride 0.9 % 100 mL IVPB     2 g 200 mL/hr over 30 Minutes Intravenous  Once 07/16/18 2330 07/17/18 0010     .   Subjective  The patient is resting comfortably. She is without new complaints.  Objective   Vitals:  Vitals:   07/25/18 0900 07/25/18 1445  BP:  132/75  Pulse: 88 (!) 125  Resp:  (!) 27  Temp:  98.5 F (36.9 C)  SpO2: 100% 97%    Exam:  Constitutional:  . Awake, alert, and oriented x 3.  Respiratory:  Decreased breath sound left lung base  cardiovascular:  . S1-S2 Abdomen:  . Abdomen soft, non-tender, non-distended . Organs are not palpable Musculoskeletal:  . No leg edema Neurologic:  . Awake and alert.   . Patient moves all extremities.    Today's Data  . Vitals, BMP, CBC, INR, glucose Imaging  . Bronchoscopy. .   Scheduled Meds: . arformoterol  15 mcg Nebulization BID  . budesonide (PULMICORT) nebulizer solution  0.5 mg Nebulization BID  . furosemide  80 mg Intravenous Q6H  . levothyroxine  62.5 mcg Intravenous Daily  . lidocaine  1 patch Transdermal Q24H  . mouth rinse  15 mL Mouth Rinse BID  . multivitamin with minerals  1 tablet Oral Daily  . pantoprazole (PROTONIX) IV  40 mg Intravenous Q12H  . sodium chloride flush  10-40 mL Intracatheter Q12H  . ticagrelor  60 mg Oral BID  . Warfarin - Pharmacist Dosing Inpatient   Does not apply q1800   Continuous Infusions: . sodium chloride Stopped  (07/24/18 1747)  . potassium chloride    . potassium chloride 10 mEq (07/25/18 1610)  . sulfamethoxazole-trimethoprim 320 mg of trimethoprim (07/25/18 1131)    Active Problems:   GI bleed   UGI bleed   Cardiac arrest (HCC)   Endobronchial mass   LOS: 8 days   A & P  Acute on chronic hypoxic respiratory failure:  Due to aspiration pneumonia with progressive mucous plugging, left pleural effusion s/p throacetesis and Right middle lobe mass. Repeat bronchoscopy performed on 07/22/2018. Pt extubated later that day and transferred to the floor. 07/25/2018: Patient has had 2 episodes of intermittent significant respiratory distress, with dropping O2 sat.  Kindly see above.  Dysphagia/aspiration: The patient's diet was advanced to Dys III on 07/23/2018. That evening the patient had an aspiration event associated with severe coughing and increased oxygen requirements. I will have corpak placed and start tube feeds. SLP is unable to perform MBS currently due to the patient's high O2 requirements. 07/25/2018: Patient remains n.p.o. for now.  Speech is following.  Pneumonia:  Stenatrophamonus  has grown out of BAL: continue IV bactrim. Blood cultures have had no growth.  Sepsis:  Resolved.   Severe COPD: In exacerbation due to pneumonia as above and right middle lobe mass. Nebulizer treatments as needed. 07/25/2018: Pulmonary examination not suggestive of severe COPD today.  Upper GI bleed with hematemesis:  Pt was taking coumadin and brillinta at home. She cas CAD and a hypercoagulable state due to Factor V Leiden. They have been stopped. GI was consulted. EGD deferred as hemoglobin has stabilized off of anticoagulants and antithrombotics. Monitor. GI has signed off. Continue bid PPI. Possibly for EGD once stable from a respiratory standpoint. 07/25/2018: Heparin discontinued.  INR is 2.8.  We will continue to monitor for now.  CAD:  Hypercoaguable state due to Factor V Leiden. Anticoagulation  held. 07/25/2018: Cardiology team consulted to rule out possible flash pulmonary edema/angina equivalent.  Recent cardiac arrest noted.  Hypothyroidism:  Continue Synthroid.   Dana Allan, MD. [facility] Direct contact: see [email or web address]  7PM-7AM contact night coverage as above

## 2018-07-25 NOTE — Progress Notes (Signed)
Chest pt not performed this round. Patient is being moved to another floor and waiting for a bed to be placed on bipap.

## 2018-07-25 NOTE — Progress Notes (Addendum)
NAME:  [name], MRN:  [id number], DOB:  1950-02-28, LOS: 8 ADMISSION DATE:  07/16/2018, CONSULTATION DATE:  7/8 REFERRING MD: Dr. Sedonia Small EDP, CHIEF COMPLAINT:  Hematemesis    Brief History   68 year old with Factor V Leiden on warfarin presented with hematemesis. Coded in [facility] ED for 12 minutes. Coagulopathy reversed with Vit K, [place] Transferred to [facility]. Extubated and transferred out Back to ICU 7/11 for mucus plug and lt lung collapse   PCCM re-engaged 7/16 for increasing oxygen demand. Clinically appears to be pulm edema. Will diurese and aim to support, hopefully avoid intubation  Past Medical History   has a past medical history of Acute MI ([place]), Arteriosclerotic cardiovascular disease (ASCVD) (2002), Chronic anticoagulation, Chronic respiratory failure ([place]) (08/27/2013), COPD (chronic obstructive pulmonary disease) ([place]), COPD (chronic obstructive pulmonary disease) ([place]), Factor 5 Leiden mutation, heterozygous ([place]), Factor V Leiden, prothrombin gene mutation ([place]) (2006), Hyperlipidemia, Hypothyroidism, Noncompliance, Pelvic fracture ([place]) (2009), Pulmonary embolism ([place]) (2006), and Tobacco abuse.  Significant Hospital Events   7/7 present with hematemesis, cardiac arrest 12 mins PEA. [facility]. Started on PPI gtt. Received blood. GI consulted. Wanted to have pt off brillinta and warfarin for 5d. abx started for aspiration 7/9 extubated  7/10: to IM service, vomited again on 7/10 on arrival to [facility] 7/11 Lt lung collapse, s/p thoracentesis, intubation, bronch.  7/16 increasing oxygen requirement. PCCM re-engaged. CXR shows pleural effusion, pulm edema, LLL opacity atelectasis vs PNA/aspiration. Will be diuresed aggressively and moved to [facility].   Consults:  Gastroenterology, PCCM  Procedures:  ETT 7/7 >7/9 CVL 7/7 > 7/9  Significant Diagnostic Tests:  Endoscopy 05/28/18 > Normal esophagus, normal stomach Bronch 7/12 > Lt lung mucus plug. Friable  endobronchial lesion on right bronchus intermedius CXR 7/16> Worsening LLL opacity, possibly atelectasis however can consider PNA/aspiration. Pulmonary edema. Small right pleural effusion  Micro Data:  Blood 7/8 > NGTD Sputum 7/11 > few stenotrophomonas  Pleural fluid 7/11> rare WBC, no organisms seen  BAL 7/11> Stenotrophomonas Maltophilia >100k colonies.   Antimicrobials:  Unasyn 7/8 >> 7/11 vanc 7/11 >> 7/13 Zosyn 7/11 >>7/13 Bactrim 7/13 >  Interim history/subjective:  Increasing oxygen requirement. Per chart review patient on 100% NRB however upon seeing patient, side flaps are open and this is not placed correctly; SpO2 96% and NAD.    PCCM re-engaged regarding increasing oxygen requirement   Objective   Blood pressure 132/75, pulse (!) 125, temperature 98.5 F (36.9 C), temperature source Axillary, resp. rate (!) 27, height _0  (1.626 m), weight 73.7 kg, SpO2 97 %.        Intake/Output Summary (Last 24 hours) at 07/25/2018 1526 Last data filed at 07/24/2018 1800 Gross per 24 hour  Intake 4376.87 ml  Output 500 ml  Net 3876.87 ml   Filed Weights   07/22/18 0500 07/24/18 0500 07/25/18 0558  Weight: 76 kg 71.2 kg 73.7 kg    Examination: General:  Elderly, chronically ill appearing adult F NAD  Neuro:  Awake alert following commands  HEENT:  NCAT anicteric sclera trachea midline pink mmm  Cardiovascular:  RRR s1s2 Capillary refill < 3 seconds  Lungs:  Diffuse crackles. No accessory muscle use. Symmetrical chest expansion  Abdomen:  Soft round ndnt  Musculoskeletal:  Symmetrical bulk and tone. No edema. No cyanosis no clubbing   Skin:  Clean dry warm intact    Resolved Hospital Problem list   PEA arrest 22/ hemorrhagic shock  Hematemesis Lactic acidosis  Assessment & Plan:   Acute on chronic hypoxic respiratory failure  -intially in setting of what appears to be aspiration PNA w/ progressive mucous plugging/atx w/ associated moderate sized left pleural  effusion s/p thoracentesis 7/11. Status post bronchoscopy 7/11 with reexpansion of left lung. Bronch on 7/13, extubated 7/13  -Re-engaged 7/16 for hypoxic respiratory failure -in setting of pulmonary edema and likely atelectasis on CXR -hx of hemoptysis -Etiology of pulmonary edema not clear at this time, cannot exclude cardiogenic etiology  Plan 30m Lasix q6 hr x 3 doses for pulmonary edema AM CXR IS q1-2 hours  CPT Continue bactrim Titrate supplemental O2 for SpO2 goal 88-92%  Continue to evaluate for possible intubation need, hopefully can manage without escalating to ETT  Troponins, ECG. No ECHO at this time, recommend awaiting result of troponins and ECG.   Hypokalemia 3.3 on 7/16 and has not been replaced P Will replete K+ as it is low at present. We will replete aggressively however in setting of diuresis above given expected kaliuresis  BMP 2100 on 7/16 and 7/17 AM   H/O COPD  Plan Continue brovana and pulmicort. PRN albuterol    Rest Per Primary   Best practice:   VAP protocol (if indicated) n/a DVT prophylaxis: SCDs GI prophylaxis: PPI Glucose control: NA Mobility: BR Code Status: Full Family Communication: Per primary      GEliseo GumMSN, AGACNP-BC [id number] no answer, [id number]/16/2020, 3:27 PM  Attending Note:  68year old female with PMH of a previous cardiac arrest who presents to PCCM for hypoxemia and hemoptysis.  Patient was previously intubated and was bronched at that point with no endobronchial lesions noted.  Reconsult for hypoxemia and hemoptysis.  No events other than above.  On exam, diffuse crackles up to her scapula and elevated JVD.  I reviewed CXR myself, pulmonary edema and LLL atelectasis noted.  On lab review BUN<5 and Cr 0.96.  Discussed with PCCM-NP and TRH-MD.  Acute respiratory failure with hypoxemia:             - Treat pulmonary edema             - Titrate O2 for sat of  88-92%             - Transfer to SDU for closer observation             - BiPAP for WOB and hypoxemia  Acute pulmonary edema:             - Lasix 80 mg IV q6 x3 doses             - Check troponins             - Check EKG             - Recommend cardiology involvement to evaluate if this is a cardiac event with CP (granted post CPR)   Hemoptysis:             - Hold heparin             - No bronchoscopy at this time             - If worsens may consider protamine to reverse her  Atelectasis:             - IS             - Up to chair  The patient is critically ill with multiple organ  systems failure and requires high complexity decision making for assessment and support, frequent evaluation and titration of therapies, application of advanced monitoring technologies and extensive interpretation of multiple databases.   Critical Care Time devoted to patient care services described in this note is  32  Minutes. This time reflects time of care of this signee Dr Jennet Maduro. This critical care time does not reflect procedure time, or teaching time or supervisory time of PA/NP/Med student/Med Resident etc but could involve care discussion time.  Rush Farmer, M.D. [facility] Pulmonary/Critical Care Medicine. Pager: [phone]. After hours pager: [phone].

## 2018-07-25 NOTE — Progress Notes (Signed)
Nutrition Follow-up  DOCUMENTATION CODES:   Non-severe (moderate) malnutrition in context of chronic illness  INTERVENTION:  When appropriate, recommend initiating:  Jevity 1.2 @ 2ml/hr, advancing 47ml every 4hrs to goal rate of 54ml/hr   Prostat daily  TF regimen provides; 1684 kcals, 88 grams protein and 1069 ml water; meeting greater than 90% of estimated needs   NUTRITION DIAGNOSIS:   Moderate Malnutrition related to chronic illness(COPD) as evidenced by mild fat depletion, mild muscle depletion, moderate muscle depletion, percent weight loss(16.9% weight loss in 5 months).  Addressing - pt NPO; TF recommendations made  GOAL:   Patient will meet greater than or equal to 90% of their needs  Unmet; progressing  MONITOR:   Diet advancement, PO intake, Supplement acceptance, Labs, Weight trends, Skin  REASON FOR ASSESSMENT:   Consult, Other (Comment)(follow up) Enteral/tube feeding initiation and management  ASSESSMENT:   68 year old female who presented to the ED on 7/07 with large volume hematemesis. Pt became unresponsive in PEA arrest and CPR initiated. ROSC was achieved. Pt required intubation in the ED. PMH of MI, factor V Leiden, COPD. TTM not pursued as pt was following commands post arrest.   RD consulted for recommendations for initiation of TF. Per RN note, NGT unable to be placed yesterday  SLP evaluation cancelled this morning d/t respiratory status  RT responded to code blue this morning; stats dropped to 80s. Patient on non-breather mask with improved stats of 98% and given AM nebulizer. Patient noted to have coughed up bright red blood - chest xray ordered   7/9 - extubated - CL diet 7/11 - returned to ICU for mucus plug, rt lung collapse; thoracentesis 7/11 - bronchoscopy; demonstrated a RML mass - biopsy and brushings taken 7/13 - bronchoscopy/extubated - transferred to floor 7/14 - DYSIII/NPO  Weights reviewed - current wt 73.3kg BUE; moderate  pitting edema, BLE; non-pitting trending up since admission  Unsure of UBW; estimated needs based on 67.5kg (wt on 7/8)  Medications reviewed and include: Synthroid, MVI, Protonix, Morphine, Zofran  Labs: K 3.3 (L)   Diet Order:   Diet Order            Diet NPO time specified  Diet effective now              EDUCATION NEEDS:   No education needs have been identified at this time  Skin:  Skin Assessment: Skin Integrity Issues: Skin Integrity Issues:: DTI DTI: buttocks  Last BM:  unknown  Height:   Ht Readings from Last 1 Encounters:  07/16/18 5\' 4"  (1.626 m)    Weight:   Wt Readings from Last 1 Encounters:  07/25/18 73.7 kg    Ideal Body Weight:  54.5 kg  BMI:  Body mass index is 27.89 kg/m.  Estimated Nutritional Needs:   Kcal:  1700-1900  Protein:  87-94  Fluid:  >/=1.7 L    Lajuan Lines, RD, LDN  After Hours/Weekend Pager: [phone]

## 2018-07-25 NOTE — Consult Note (Addendum)
..   NAME:  [name], MRN:  [id number], DOB:  12/15/1950, LOS: 8 ADMISSION DATE:  23-Jul-2018, CONSULTATION DATE:  07/25/2018 REFERRING MD:  Marthenia Rolling MD, CHIEF COMPLAINT:  S/p cardiac arrest   Brief History   68 yo F w/ PMHx Factor V Leiden on warfarin known to PCCM service seen earlier today. Re-consulted due to cardiac arrest  History of present illness   (History from account of other providers and EMR)  68 yr old with COPD, MI, HTN, and PE, Factor V Leiden on Warfarin (also on Brillinta) presented with large volume hematemesis on late hrs of 2018-07-23. Coded in [facility] ED for 12 minutes PEA. Coagulopathy reversed with Vit K, [place]. Transferred to [facility] started on PPI. Received transfusions and GI consulted for EGD ( wanted pt to be off AC for 5 days). Bronchoscopy on 7/13 showed a Extubated on 7/9 and transferred out to Internal Medicine service. Back to ICU 7/11 for mucus plug and lt lung collapse s/p thoracentesis was re-intubated and has a bronchoscopy on 7/11/ showed the whole left mainstem bronchus was occluded by mucous plug and thick secretions which extended all the way into the lower lobe. It was suctioned out and BAL was sent which was positive for Stenotrophomonas . Repeat Bronchosopy on 7/13 prior to extubation was negative. Extubated successfully on 7/13 and downgraded to Internal Medicine.   PCCM re-engaged 7/16 for increasing oxygen demand. Clinically appears to be pulm edema. Plan for diuresis and aim to support, hopefully avoid intubation.  Cardiac Arrest>>>07/25/2018 x 2  1st code 1918>>>1923 PEA - received 1 amp Epi and 1 Atropine Endotracheally intubated by CRNA at [place] 1929>>1937>> PEA>>> received 1 Epi ROSC at 1937>> in ST received 1 amp Bicarb Was on Dopamine at 10 mcg>>>Dopamine 5 mcg>>> off Transferring to ICU    Past Medical History  .[name] Active Ambulatory Problems    Diagnosis Date Noted  . Hypothyroidism 11/23/2006  . TOBACCO ABUSE 11/23/2006  . History of  pulmonary embolus (PE) 11/23/2006  . COPD (chronic obstructive pulmonary disease) with emphysema ([place]) 11/23/2006  . CAD S/P percutaneous coronary angioplasty   . Factor V Leiden, prothrombin gene mutation ([place])   . Hyperlipidemia   . Chronic anticoagulation   . Cerebrovascular disease 11/15/2011  . Abnormal weight loss 03/21/2012  . Encounter for therapeutic drug monitoring 02/13/2013  . Chest pain 08/24/2013  . Chronic respiratory failure ([place]) 08/27/2013  . Dyspnea 11/26/2014  . Loss of weight 11/26/2014  . COPD exacerbation ([place]) 02/19/2017  . Tachycardia 02/19/2017  . Influenza A 02/19/2017  . Acute respiratory failure ([place]) 02/19/2017  . Chronic back pain 05/16/2017  . Rectal bleeding 09/05/2017  . Left lower quadrant pain 09/05/2017  . Oxygen dependent 09/05/2017  . Vertebral fracture, osteoporotic ([place]) 01/20/2018  . Hyperglycemia 01/21/2018  . COPD with exacerbation ([place]) 01/21/2018  . Acute on chronic respiratory failure with hypoxia and hypercapnia ([place]) 01/29/2018  . Acute and chronic respiratory failure (acute-on-chronic) ([place]) 01/29/2018  . Pressure injury of skin 01/29/2018  . Acute on chronic respiratory failure ([place]) 05/23/2018  . Melena 05/23/2018  . Factor V deficiency ([place]) 05/23/2018  . Depression 05/23/2018  . Chronic pain syndrome 05/23/2018  . Hypercapnia 05/23/2018  . Malnutrition of moderate degree 05/24/2018  . Hematemesis   . Elevated troponin    Resolved Ambulatory Problems    Diagnosis Date Noted  . Obstructive chronic bronchitis with exacerbation ([place]) 11/26/2006  . DIZZINESS 05/06/2009  . UNSTEADY GAIT 06/09/2009  . CHEST PAIN 06/15/2009  .  DOE (dyspnea on exertion) 06/13/2011  . OSA (obstructive sleep apnea)   . Pneumonia 08/26/2013  . Acute bronchitis 11/03/2013   Past Medical History:  Diagnosis Date  . Acute MI ([place])   . CAD (coronary artery disease) 2002  . COPD (chronic obstructive pulmonary disease) ([place])   . Factor 5 Leiden  mutation, heterozygous ([place])   . Noncompliance   . Pelvic fracture ([place]) 2009  . Pulmonary embolism ([place]) 2006  . Tobacco abuse      Significant Hospital Events   Cardiac Arrest>>>07/25/2018 x 2  1st code 1918>>>1923 PEA - received 1 amp Epi and 1 Atropine Endotracheally intubated by CRNA at [place] 1929>>1937>> PEA>>> received 1 Epi ROSC at 1937>> in ST received 1 amp Bicarb Was on Dopamine at 10 mcg>>>Dopamine 5 mcg>>> off Labs during were pulled from double lumen PICC  Consults:  PCCM>>> Cardiology>>7/16  Procedures:  endotracheal intubation >>> 07/25/2018  Significant Diagnostic Tests:  Endoscopy 05/28/18 > Normal esophagus, normal stomach Bronch 7/12 > Lt lung mucus plug. Friable endobronchial lesion on right bronchus intermedius CXR 7/16> Worsening LLL opacity, possibly atelectasis however can consider PNA/aspiration. Pulmonary edema. Small right pleural effusion  CXR 07/25/2018  IMPRESSION: 1. Effusion and opacity in left base has worsened. The underlying opacity could be atelectasis. However, the findings are at least somewhat concerning for pneumonia or aspiration. Recommend clinical correlation. 2. Small right pleural effusion. 3. Mild pulmonary edema.  ECHO 07/05/2018  FINDINGS  Left Ventricle: The left ventricle has normal systolic function, with an ejection fraction of 60-65%. The cavity size was normal. Moderate basal septal hypertrophy. Otherwise, mild concentric LVH of remaining myocardium. Diastolic dysfunction, grade  indeterminate. Elevated left ventricular end-diastolic pressure No evidence of left ventricular regional wall motion abnormalities.. Right Ventricle: The right ventricle has normal systolic function. The cavity was normal. There is no increase in right ventricular wall thickness. Left Atrium: Left atrial size was mildly dilated. Right Atrium: Right atrial size was normal in size. Right atrial pressure is estimated at 10 mmHg. Interatrial Septum: No  atrial level shunt detected by color flow Doppler. Pericardium: Trivial pericardial effusion is present. Mitral Valve: The mitral valve is degenerative in appearance. Mild thickening of the mitral valve leaflet. Mild calcification of the mitral valve leaflet. There is mild mitral annular calcification present. Mitral valve regurgitation is mild by color  flow Doppler. Tricuspid Valve: The tricuspid valve is grossly normal. Tricuspid valve regurgitation is trivial by color flow Doppler. Aortic Valve: The aortic valve was not well visualized Mild thickening of the aortic valve. Mild calcification of the aortic valve. Aortic valve regurgitation was not visualized by color flow Doppler. Pulmonic Valve: The pulmonic valve was not well visualized. Pulmonic valve regurgitation is not visualized by color flow Doppler. Aorta: The aortic root is normal in size and structure. Venous: The inferior vena cava IVC is dilated. Unable to assess respiratory variation. Micro Data:  Blood 7/8 > NGTD Sputum 7/11 > few stenotrophomonas  Pleural fluid 7/11> rare WBC, no organisms seen  BAL 7/11> Stenotrophomonas Maltophilia >100k colonies.   Antimicrobials:  Unasyn 7/8 >> 7/11 vanc 7/11 >> 7/13 Zosyn 7/11 >>7/13 Bactrim 7/13 >  Objective   Blood pressure 123/69, pulse 95, temperature 98.3 F (36.8 C), temperature source Axillary, resp. rate 18, height _0  (1.626 m), weight 73.7 kg, SpO2 92 %.        Intake/Output Summary (Last 24 hours) at 07/25/2018 2008 Last data filed at 07/25/2018 1836 Gross per 24 hour  Intake 120  ml  Output 1550 ml  Net -1430 ml   Filed Weights   07/22/18 0500 07/24/18 0500 07/25/18 0558  Weight: 76 kg 71.2 kg 73.7 kg    Examination: General: thin female intubated  HENT: normocephalic with ETT <PZWCHENIDPOEUMPN>_3<\/IRWERXVQMGQQPYPP>_5  Lungs: coarse b/l with decreased breath sounds at left base Cardiovascular: S1 and S2 appreciated  Abdomen: soft with striae distended + BS Extremities: no edema, chronic  hypertrophic changes on shins Neuro: + corneal + gag moving upper ext, sluggish dilated pupils GU: pure wick   Assessment & Plan:  1. S/p cardiac arrest PEA x 2 total of 19 mins S/p A line placed for hemodynamic monitoring. In right femoral (unable to achieve ABGs at radials 2/2 to PVD) Transfer to ICU ECHO>>> Plan: No TTM>> normothermia avoid fevers Suspect Acute Coronary syndrome>> pt was c/o chest pain prior trops increased no changes on EKG Oxygenation ok with no elevated plateaus unlikely PE INR on repeat 3.1PT 31  2. Acute Resp failure with hypoxia and hypercarbia Endotracheally intubated during CODE CXR shows ETT in good position Pulmonary vasc congestion noted no lobar collapse Plan: Continue full MV support  TV 8cc/kg Increased MVE Titrate FiO2 to keep O2 sat >92% Repeat ABG in 1 hr post  3. Acute Coronary syndrome Troponin 12>> 34 No ST changes on EKG  Some T Wave abnormality ( old regional wall motion abnormality Was seen by Cardiology who states EF 55% with preserved LV Not a candidate for intervention or angio Brilinta was on hold due to UGIB INR 3.1 Plan: Continue on cardiac monitoring Will hold AC at this time given pt's complex clinical picture Pain mgmt with intermittent Fentanyl Limited echo in AM to assess for valvular function Will discuss with patient GOC  4. Hematemesis secondary to UGIB Was waiting on EGD Plan: F/u GI recs Continue PPI S/p OGT placement currently to Gravity INR 3.1 will not reverse at this time given clinical picture No signs of active bleeding Last Hgb 9.... transfuse ig Hgb <7 Continue to hold Brilinta at this time  Best practice:  Diet: NPO Pain/Anxiety/Delirium protocol (if indicated): intermittent fentanyl VAP protocol (if indicated): yes DVT prophylaxis: on Warfarin and Brilinta INR >3 only SCDs  GI prophylaxis: PPI Glucose control: if BG exceeds 144m/dl start ISS Mobility: Bedrest Code Status: FULL>>> needs  a palliative consult and GBerwynconversation Family Communication: notified and on their way to the hospital Disposition: ICU  Labs   CBC: Recent Labs  Lab 07/19/18 0754  07/22/18 0520  07/23/18 0415  07/24/18 0437 07/24/18 1738 07/25/18 0336 07/25/18 1655 07/25/18 1949  WBC 8.2   < > 7.4  --  6.9  --  4.5  --  4.5  --  6.9  NEUTROABS 6.2  --   --   --   --   --  3.5  --   --   --   --   HGB 10.7*   < > 8.4*   < > 9.0*   < > 8.8* 8.9* 8.5* 9.4* 9.0*  HCT 34.0*   < > 26.3*   < > 28.9*   < > 28.4* 28.4* 27.5* 30.5* 29.6*  MCV 96.9   < > 95.3  --  98.0  --  97.6  --  97.5  --  100.0  PLT 168   < > 126*  --  PLATELET CLUMPS NOTED ON SMEAR, COUNT APPEARS DECREASED  --  119*  --  146*  --  192   < > =  values in this interval not displayed.    Basic Metabolic Panel: Recent Labs  Lab 07/20/18 2211  07/22/18 0520 07/23/18 0415 07/24/18 0437 07/25/18 0336 07/25/18 1825  NA  --    < > 137 138 138 137 107*  K  --    < > 3.2* 3.6 3.2* 3.3* 2.7*  CL  --    < > 102 102 97* 95* 69*  CO2  --    < > 24 29 32 32 27  GLUCOSE  --    < > 75 93 82 100* 1,069*  BUN  --    < > 13 7* <5* <5* <5*  CREATININE  --    < > 0.86 0.82 0.98 0.92 0.86  CALCIUM  --    < > 7.7* 7.9* 7.9* 7.9* 6.2*  MG 1.6*  --  1.7  --   --   --   --   PHOS  --   --  2.8  --   --   --   --    < > = values in this interval not displayed.   GFR: Estimated Creatinine Clearance: 61.6 mL/min (by C-G formula based on SCr of 0.86 mg/dL). Recent Labs  Lab 07/21/18 0501  07/23/18 0415 07/24/18 0437 07/25/18 0336 07/25/18 1949  WBC  --    < > 6.9 4.5 4.5 6.9  LATICACIDVEN 1.1  --   --   --   --   --    < > = values in this interval not displayed.    Liver Function Tests: Recent Labs  Lab 07/20/18 1154 07/24/18 0437  AST  --  17  ALT  --  23  ALKPHOS  --  93  BILITOT  --  0.5  PROT 5.0* 4.7*  ALBUMIN 2.1* 2.0*   No results for input(s): LIPASE, AMYLASE in the last 168 hours. No results for input(s): AMMONIA in  the last 168 hours.  ABG    Component Value Date/Time   PHART 7.292 (L) 07/20/2018 1828   PCO2ART 66.5 (HH) 07/20/2018 1828   PO2ART 383.0 (H) 07/20/2018 1828   HCO3 32.1 (H) 07/20/2018 1828   TCO2 34 (H) 07/20/2018 1828   ACIDBASEDEF 0.5 02/19/2017 2100   O2SAT 100.0 07/20/2018 1828     Coagulation Profile: Recent Labs  Lab 07/19/18 0318 07/20/18 0703 07/23/18 0415 07/24/18 0437 07/25/18 0336  INR 1.1 1.2 1.4* 1.7* 2.8*    Cardiac Enzymes: No results for input(s): CKTOTAL, CKMB, CKMBINDEX, TROPONINI in the last 168 hours.  HbA1C: Hgb A1c MFr Bld  Date/Time Value Ref Range Status  08/25/2013 08:24 PM 6.0 (H) <5.7 % Final    Comment:    (NOTE)                                                                       According to the ADA Clinical Practice Recommendations for 2011, when HbA1c is used as a screening test:  >=6.5%   Diagnostic of Diabetes Mellitus           (if abnormal result is confirmed) 5.7-6.4%   Increased risk of developing Diabetes Mellitus References:Diagnosis and Classification of Diabetes Mellitus,Diabetes KMMN,8177,11(AFBXU 1):S62-S69 and Standards of Medical Care in  Diabetes - 2011,Diabetes Care,2011,34 (Suppl 1):S11-S61.  06/22/2010 07:35 PM 5.7 (H) <5.7 % Final    Comment:    (NOTE)                                                                       According to the ADA Clinical Practice Recommendations for 2011, when HbA1c is used as a screening test:  >=6.5%   Diagnostic of Diabetes Mellitus           (if abnormal result is confirmed) 5.7-6.4%   Increased risk of developing Diabetes Mellitus References:Diagnosis and Classification of Diabetes Mellitus,Diabetes ZOXW,9604,54(UJWJX 1):S62-S69 and Standards of Medical Care in         Diabetes - 2011,Diabetes BJYN,8295,62 (Suppl 1):S11-S61.    CBG: No results for input(s): GLUCAP in the last 168 hours.  Review of Systems:   Marland KitchenMarland KitchenReview of Systems  Unable to perform ROS: Intubated      Past Medical History  She,  has a past medical history of Acute MI ([place]), CAD (coronary artery disease) (2002), Chronic anticoagulation, Chronic respiratory failure ([place]) (08/27/2013), COPD (chronic obstructive pulmonary disease) ([place]), Factor 5 Leiden mutation, heterozygous ([place]), Factor V Leiden, prothrombin gene mutation ([place]) (2006), Hyperlipidemia, Hypothyroidism, Noncompliance, Pelvic fracture ([place]) (2009), Pulmonary embolism ([place]) (2006), and Tobacco abuse.   Surgical History    Past Surgical History:  Procedure Laterality Date  . COLONOSCOPY  Approximately 2000   Negative screening study  . COLONOSCOPY WITH PROPOFOL N/A 05/28/2018   Procedure: COLONOSCOPY WITH PROPOFOL;  Surgeon: Rogene Houston, MD;  [facility];  Service: Endoscopy;  Laterality: N/A;  . CORONARY ANGIOPLASTY  2002, 2003, 2012  . ESOPHAGOGASTRODUODENOSCOPY (EGD) WITH PROPOFOL N/A 05/26/2018   Procedure: ESOPHAGOGASTRODUODENOSCOPY (EGD) WITH PROPOFOL;  Surgeon: Rogene Houston, MD;  Location: [facility];  Service: Endoscopy;  Laterality: N/A;  . LEFT AND RIGHT HEART CATHETERIZATION WITH CORONARY ANGIOGRAM N/A 04/03/2011   Procedure: LEFT AND RIGHT HEART CATHETERIZATION WITH CORONARY ANGIOGRAM;  Surgeon: Sherren Mocha, MD;  Location: [facility] CATH LAB;  Service: Cardiovascular;  Laterality: N/A;  . POLYPECTOMY  05/28/2018   Procedure: POLYPECTOMY;  Surgeon: Rogene Houston, MD;  Location: [facility];  Service: Endoscopy;;  cold snare and biopsy forcep     Social History   reports that she has been smoking cigarettes. She started smoking about 50 years ago. She has a 25.00 pack-year smoking history. She has never used smokeless tobacco. She reports that she does not drink alcohol or use drugs.   Family History   Her family history includes Alzheimer's disease in her mother; Emphysema in her mother and sister; Factor V Leiden deficiency in her daughter, father, and sister; Heart disease in her father;  Kidney cancer in her paternal grandmother.   Allergies Allergies  Allergen Reactions  . Dilaudid [Hydromorphone Hcl] Hives and Nausea Only  . Minocycline Hcl     REACTION: Dizzy  . Prednisone     REACTION: feels like throat swelling, hallucinations  . Varenicline Tartrate     REACTION: Dizzy(chantix)   . Zocor [Simvastatin - High Dose] Other (See Comments)    myalgia     Home Medications  Prior to Admission medications   Medication Sig Start Date End Date  Taking? Authorizing Provider  albuterol (PROVENTIL) (2.5 MG/3ML) 0.083% nebulizer solution USE 1 VIAL BY NEBULIZER EVERY 4 HOURS AS NEEDED FOR WHEEZING. DX: J44.9 Patient taking differently: Take 2.5 mg by nebulization every 4 (four) hours as needed for wheezing or shortness of breath.  01/24/17  Yes Magdalen Spatz, NP  arformoterol (BROVANA) 15 MCG/2ML NEBU Take 2 mLs (15 mcg total) by nebulization 2 (two) times daily. 09/07/17  Yes Juanito Doom, MD  baclofen (LIORESAL) 10 MG tablet Take 1 tablet (10 mg total) by mouth 3 (three) times daily. 01/24/18  Yes Lavina Hamman, MD  bisacodyl (DULCOLAX) 5 MG EC tablet Take 1 tablet (5 mg total) by mouth daily as needed for mild constipation. 05/29/18  Yes Shah, Pratik D, DO  bisoprolol (ZEBETA) 5 MG tablet Take 0.5 tablets (2.5 mg total) by mouth daily. 07/12/18 08/11/18 Yes Shah, Pratik D, DO  budesonide (PULMICORT) 0.5 MG/2ML nebulizer solution Take 2 mLs (0.5 mg total) by nebulization 2 (two) times daily. Dx: J43.9 01/24/17  Yes Magdalen Spatz, NP  buPROPion [facility] SR) 100 MG 12 hr tablet Take 100 mg by mouth every morning.   Yes [provider]  citalopram (CELEXA) 10 MG tablet Take 10 mg by mouth daily.  04/29/18  Yes [provider]  enoxaparin (LOVENOX) 120 MG/0.8ML injection Inject 0.73 mLs (110 mg total) into the skin daily for 10 days. 07/11/18 07/21/18 Yes Shah, Pratik D, DO  Glycerin-Hypromellose-PEG 400 (DRY EYE RELIEF DROPS) 0.2-0.2-1 % SOLN Place 1-2 drops into  both eyes 2 (two) times a day.   Yes [provider]  HYDROcodone-acetaminophen (NORCO/VICODIN) 5-325 MG tablet Take 1 tablet by mouth every 6 (six) hours as needed (BREAKTHROUGH PAIN). 02/19/18  Yes Johnson, Clanford L, MD  ipratropium-albuterol (DUONEB) 0.5-2.5 (3) MG/3ML SOLN Take 3 mLs by nebulization 4 (four) times daily. 03/20/17  Yes Magdalen Spatz, NP  levothyroxine (SYNTHROID, LEVOTHROID) 125 MCG tablet Take 1 tablet (125 mcg total) by mouth daily. 08/16/17  Yes Nafziger, Tommi Rumps, NP  nitroGLYCERIN (NITROSTAT) 0.4 MG SL tablet Place 1 tablet (0.4 mg total) under the tongue every 5 (five) minutes as needed for chest pain. 10/28/14  Yes Herminio Commons, MD  Omega-3 Fatty Acids (FISH OIL) 600 MG CAPS Take 1 capsule by mouth daily.    Yes [provider]  pantoprazole (PROTONIX) 40 MG tablet Take 1 tablet (40 mg total) by mouth daily. Patient taking differently: Take 40 mg by mouth daily as needed (reflux/heartburn).  02/20/18  Yes Johnson, Clanford L, MD  rosuvastatin (CRESTOR) 5 MG tablet Take 1 tablet (5 mg total) by mouth daily at 6 PM. 07/11/18 08/10/18 Yes Shah, Pratik D, DO  ticagrelor (BRILINTA) 60 MG TABS tablet Take 1 tablet (60 mg total) by mouth 2 (two) times daily. 02/01/18  Yes Johnson, Clanford L, MD  traZODone (DESYREL) 50 MG tablet Take 50 mg by mouth at bedtime as needed for sleep.   Yes [provider]  warfarin (COUMADIN) 1 MG tablet Take 5-6 mg by mouth See admin instructions. On Sunday, Tuesday, Thursday, and Saturday, patient takes 57m; on all other days, patient takes 567m  Yes [provider]  ferrous sulfate 325 (65 FE) MG tablet Take 1 tablet (325 mg total) by mouth daily with breakfast. Patient not taking: Reported on 07/19/2018 07/11/18 08/10/18  ShHeath Lark, DO  Respiratory Therapy Supplies (FLUTTER) DEVI Use as directed 05/14/15   NeJavier GlazierMD      I,  Dr Seward Carol have personally reviewed patient's available data,  including medical history, events of note, physical examination and test results as part of my evaluation. I have discussed with NP Moshe Cipro and other care providers such as pharmacist, RN and Elink.  In addition,  I personally evaluated patient  The patient is critically ill with multiple organ systems failure and requires high complexity decision making for assessment and support, frequent evaluation and titration of therapies, application of advanced monitoring technologies and extensive interpretation of multiple databases.   Critical Care Time devoted to patient care services described in this note is 65 Minutes. This time reflects time of care of this signee Dr Seward Carol. This critical care time does not reflect procedure time, or teaching time or supervisory time of NP but could involve care discussion time   Dr. Seward Carol Pulmonary Critical Care Medicine  07/25/2018 9:03 PM      Critical care time: 65 mins

## 2018-07-25 NOTE — Progress Notes (Signed)
Patient transferred to 2C-12 from 5W-37. Report given to [facility], [place]. Patient alert at the time of transport with some dyspnea on the nonrebreather. Bipap delivered to new room with RT at bedside.

## 2018-07-25 NOTE — Progress Notes (Signed)
Chaplain responded to [place]. Provided ministry of support and presence to staff.  Chaplain provided phone number of NOK, a daughter, as well as friends' number from the [place], in order for doctor to speak with loved ones. Daughter could not be contacted and only a friend, Shauna Hugh, was notified as of this writing of pt's condition.   Will continue to be available. Rev. Tamsen Snider Pager [phone]

## 2018-07-25 NOTE — Procedures (Signed)
Arterial Catheter Insertion Procedure Note [name] [id number] May 07, 1950  Procedure: Insertion of Arterial Catheter  Indications: Blood pressure monitoring and Frequent blood sampling  Procedure Details Consent: Unable to obtain consent because of emergent medical necessity. Time Out: Verified patient identification, verified procedure, site/side was marked, verified correct patient position, special equipment/implants available, medications/allergies/relevent history reviewed, required imaging and test results available.  Performed  Maximum sterile technique was used including antiseptics, cap, gloves, gown, hand hygiene, mask and sheet. Skin prep: Chlorhexidine; 20 gauge catheter was inserted into right femoral artery using the Seldinger technique. ULTRASOUND GUIDANCE USED: YES Evaluation Blood flow good; BP tracing good. Complications: No apparent complications.  Kennieth Rad, MSN, AGACNP-BC [facility] Pgr: [phone] or if no answer [phone] 07/25/2018, 8:37 PM

## 2018-07-25 NOTE — Code Documentation (Signed)
  Patient Name: [name]   MRN: [id number]   Date of Birth/ Sex: January 25, 1950 , female      Admission Date: 07/16/2018  Attending Provider: Bonnell Public, MD  Primary Diagnosis: <principal problem not specified>   Indication: Pt was in her usual state of health until this PM, when she was noted to be PEA. Code blue was subsequently called. At the time of arrival on scene, ACLS protocol was underway.   Technical Description:  - CPR performance duration:  30 minutes  - Was defibrillation or cardioversion used? No   - Was external pacer placed? Yes  - Was patient intubated pre/post CPR? Yes   Medications Administered: Y = Yes; Blank = No Amiodarone    Atropine  Y  Calcium    Epinephrine  Y  Lidocaine    Magnesium    Norepinephrine    Phenylephrine    Sodium bicarbonate  Y  Vasopressin     Post CPR evaluation:  - Final Status - Was patient successfully resuscitated ? Yes - What is current rhythm? SVT - What is current hemodynamic status? *90/60  Miscellaneous Information:  - Labs sent, including: ABGs, aPTT, INR   - Primary team notified?  Yes  - Family Notified? Yes  - Additional notes/ transfer status: xfer to [facility]     Maudie Mercury, MD  07/25/2018, 8:05 PM

## 2018-07-25 NOTE — Progress Notes (Signed)
ANTICOAGULATION CONSULT NOTE - Follow Up Consult  Pharmacy Consult for Warfarin Indication: atrial fibrillation  Allergies  Allergen Reactions  . Dilaudid [Hydromorphone Hcl] Hives and Nausea Only  . Minocycline Hcl     REACTION: Dizzy  . Prednisone     REACTION: feels like throat swelling, hallucinations  . Varenicline Tartrate     REACTION: Dizzy(chantix)   . Zocor [Simvastatin - High Dose] Other (See Comments)    myalgia    Patient Measurements: Height: 5\' 4"  (162.6 cm) Weight: 162 lb 7.7 oz (73.7 kg) IBW/kg (Calculated) : 54.7  Vital Signs: Temp: 98.5 F (36.9 C) (07/16 1445) Temp Source: Axillary (07/16 1445) BP: 132/75 (07/16 1445) Pulse Rate: 125 (07/16 1445)  Labs: Recent Labs    07/23/18 0415  07/24/18 0437 07/24/18 1738 07/25/18 0336  HGB 9.0*   < > 8.8* 8.9* 8.5*  HCT 28.9*   < > 28.4* 28.4* 27.5*  PLT PLATELET CLUMPS NOTED ON SMEAR, COUNT APPEARS DECREASED  --  119*  --  146*  LABPROT 17.4*  --  20.0*  --  29.3*  INR 1.4*  --  1.7*  --  2.8*  HEPARINUNFRC  --   --   --   --  0.15*  CREATININE 0.82  --  0.98  --  0.92   < > = values in this interval not displayed.    Estimated Creatinine Clearance: 57.6 mL/min (by C-G formula based on SCr of 0.92 mg/dL).  Assessment:  68 yr old female with hx Factor V Leiden on warfarin PTA which was reversed with [place] and Vitamin K on 07/17/18 d/t coagulapathy. No current plans for EGD per GI so CCM ordered anticoagulation to be restarted on 7/13.  Warfarin 5 mg was ordered on 7/13, but unable to take due to NPO.  Warfarin 5 mg given 7/14 pm, then held again on 7/15 as NPO and unable to place NG tube due to respiratory status.  IV heparin begun 7/15 pm, then stopped this am due to hemoptysis and INR up to 2.8, likely due to concurrent Bactrim IV for Stenotrophomonas pneumonia.        PTA warfarin:  5 mg MWFSun, 6 mg TTSat.    Goal of Therapy:  INR 2-3 Monitor platelets by anticoagulation protocol: Yes   Plan:    No warfarin tonight.  Daily PT/INR.  Will follow up for resuming warfarin when tube placed or able to take PO.  Watch for sensitivity to warfarin with concurrent Bactrim.  Arty Baumgartner, [place] Pager: [phone] or phone: [phone] 07/25/2018,4:31 PM

## 2018-07-26 ENCOUNTER — Inpatient Hospital Stay (HOSPITAL_COMMUNITY): Payer: Medicare Other

## 2018-07-26 DIAGNOSIS — I340 Nonrheumatic mitral (valve) insufficiency: Secondary | ICD-10-CM

## 2018-07-26 DIAGNOSIS — I361 Nonrheumatic tricuspid (valve) insufficiency: Secondary | ICD-10-CM

## 2018-07-26 DIAGNOSIS — J9809 Other diseases of bronchus, not elsewhere classified: Secondary | ICD-10-CM

## 2018-07-26 DIAGNOSIS — J9621 Acute and chronic respiratory failure with hypoxia: Secondary | ICD-10-CM

## 2018-07-26 LAB — BASIC METABOLIC PANEL
Anion gap: 10 (ref 5–15)
BUN: 7 mg/dL — ABNORMAL LOW (ref 8–23)
CO2: 38 mmol/L — ABNORMAL HIGH (ref 22–32)
Calcium: 7.9 mg/dL — ABNORMAL LOW (ref 8.9–10.3)
Chloride: 89 mmol/L — ABNORMAL LOW (ref 98–111)
Creatinine, Ser: 1.12 mg/dL — ABNORMAL HIGH (ref 0.44–1.00)
GFR calc Af Amer: 58 mL/min — ABNORMAL LOW (ref >60)
GFR calc non Af Amer: 50 mL/min — ABNORMAL LOW (ref >60)
Glucose, Bld: 78 mg/dL (ref 70–99)
Potassium: 3.6 mmol/L (ref 3.5–5.1)
Sodium: 137 mmol/L (ref 135–145)

## 2018-07-26 LAB — PROTIME-INR
INR: 3.8 — ABNORMAL HIGH (ref 0.8–1.2)
Prothrombin Time: 36.9 seconds — ABNORMAL HIGH (ref 11.4–15.2)

## 2018-07-26 LAB — CBC
HCT: 27.7 % — ABNORMAL LOW (ref 36.0–46.0)
Hemoglobin: 8.8 g/dL — ABNORMAL LOW (ref 12.0–15.0)
MCH: 30 pg (ref 26.0–34.0)
MCHC: 31.8 g/dL (ref 30.0–36.0)
MCV: 94.5 fL (ref 80.0–100.0)
Platelets: 189 10*3/uL (ref 150–400)
RBC: 2.93 MIL/uL — ABNORMAL LOW (ref 3.87–5.11)
RDW: 14.2 % (ref 11.5–15.5)
WBC: 7.9 10*3/uL (ref 4.0–10.5)
nRBC: 0 % (ref 0.0–0.2)

## 2018-07-26 LAB — ECHOCARDIOGRAM LIMITED
Height: 64 in
Weight: 2483.26 oz

## 2018-07-26 LAB — HEMOGLOBIN AND HEMATOCRIT, BLOOD
HCT: 26.1 % — ABNORMAL LOW (ref 36.0–46.0)
Hemoglobin: 8.6 g/dL — ABNORMAL LOW (ref 12.0–15.0)

## 2018-07-26 LAB — MAGNESIUM: Magnesium: 1.6 mg/dL — ABNORMAL LOW (ref 1.7–2.4)

## 2018-07-26 LAB — PHOSPHORUS: Phosphorus: 2 mg/dL — ABNORMAL LOW (ref 2.5–4.6)

## 2018-07-26 LAB — GLUCOSE, CAPILLARY: Glucose-Capillary: 101 mg/dL — ABNORMAL HIGH (ref 70–99)

## 2018-07-26 MED ORDER — MIDAZOLAM HCL 2 MG/2ML IJ SOLN
2.0000 mg | Freq: Once | INTRAMUSCULAR | Status: AC
  Administered 2018-07-26: 2 mg via INTRAVENOUS

## 2018-07-26 MED ORDER — POTASSIUM PHOSPHATES 15 MMOLE/5ML IV SOLN
30.0000 mmol | Freq: Once | INTRAVENOUS | Status: AC
  Administered 2018-07-26: 30 mmol via INTRAVENOUS
  Filled 2018-07-26: qty 10

## 2018-07-26 MED ORDER — MIDAZOLAM HCL 2 MG/2ML IJ SOLN
INTRAMUSCULAR | Status: AC
  Administered 2018-07-26: 10:00:00 2 mg via INTRAVENOUS
  Filled 2018-07-26: qty 4

## 2018-07-26 MED ORDER — SULFAMETHOXAZOLE-TRIMETHOPRIM 400-80 MG/5ML IV SOLN
320.0000 mg | Freq: Three times a day (TID) | INTRAVENOUS | Status: AC
  Administered 2018-07-26 – 2018-08-01 (×19): 320 mg via INTRAVENOUS
  Filled 2018-07-26 (×21): qty 20

## 2018-07-26 MED ORDER — VITAL AF 1.2 CAL PO LIQD
1000.0000 mL | ORAL | Status: DC
  Administered 2018-07-26 – 2018-07-29 (×4): 1000 mL

## 2018-07-26 MED ORDER — MAGNESIUM SULFATE 4 GM/100ML IV SOLN
4.0000 g | Freq: Once | INTRAVENOUS | Status: AC
  Administered 2018-07-26: 12:00:00 4 g via INTRAVENOUS
  Filled 2018-07-26: qty 100

## 2018-07-26 MED ORDER — FENTANYL CITRATE (PF) 100 MCG/2ML IJ SOLN
100.0000 ug | Freq: Once | INTRAMUSCULAR | Status: AC
  Administered 2018-07-26: 10:00:00 100 ug via INTRAVENOUS

## 2018-07-26 MED ORDER — ETOMIDATE 2 MG/ML IV SOLN
20.0000 mg | Freq: Once | INTRAVENOUS | Status: AC
  Administered 2018-07-26: 20 mg via INTRAVENOUS

## 2018-07-26 MED ORDER — POTASSIUM CHLORIDE 20 MEQ/15ML (10%) PO SOLN
40.0000 meq | Freq: Three times a day (TID) | ORAL | Status: AC
  Administered 2018-07-26 (×2): 40 meq
  Filled 2018-07-26 (×2): qty 30

## 2018-07-26 MED ORDER — ETOMIDATE 2 MG/ML IV SOLN
INTRAVENOUS | Status: AC
  Administered 2018-07-26: 10:00:00 20 mg via INTRAVENOUS
  Filled 2018-07-26: qty 10

## 2018-07-26 MED ORDER — FUROSEMIDE 10 MG/ML IJ SOLN
40.0000 mg | Freq: Four times a day (QID) | INTRAMUSCULAR | Status: AC
  Administered 2018-07-26 (×3): 40 mg via INTRAVENOUS
  Filled 2018-07-26 (×3): qty 4

## 2018-07-26 MED FILL — Medication: Qty: 1 | Status: AC

## 2018-07-26 NOTE — Progress Notes (Addendum)
NAME:  [name], MRN:  [id number], DOB:  08/04/1950, LOS: 9 ADMISSION DATE:  07/29/18, CONSULTATION DATE:  07/25/2018 REFERRING MD:  Marthenia Rolling MD, CHIEF COMPLAINT: S/p cardiac arrest    Brief History   68 yo F w/ PMHx Factor V Leiden on warfarin known to PCCM service seen earlier today. Re-consulted due to cardiac arrest  History of present illness   (History from account of other providers and EMR)  68 yr old with COPD, MI, HTN, and PE, Factor V Leiden on Warfarin (also on Brillinta) presented with large volume hematemesis on late hrs of 29-Jul-2018. Coded in [facility] ED for 12 minutes PEA. Coagulopathy reversed with Vit K, [place]. Transferred to [facility] started on PPI. Received transfusions and GI consulted for EGD ( wanted pt to be off AC for 5 days). Bronchoscopy on 7/13 showed a Extubated on 7/9 and transferred out to Internal Medicine service. Back to ICU 7/11 for mucus plug and lt lung collapse s/p thoracentesis was re-intubated and has a bronchoscopy on 7/11/ showed the whole left mainstem bronchus was occluded by mucous plug and thick secretions which extended all the way into the lower lobe. It was suctioned out and BAL was sent which was positive for Stenotrophomonas . Repeat Bronchosopy on 7/13 prior to extubation was negative. Extubated successfully on 7/13 and downgraded to Internal Medicine.   PCCM re-engaged 7/16 for increasing oxygen demand. Clinically appears to be pulm edema. Plan for diuresis and aim to support, hopefully avoid intubation. Evening of 7/16 experienced PEA arrest. Got epi, atropine, and dopamine. Intubated and transferred to the ICU for further evaluation and management.   Past Medical History   Past Medical History:  Diagnosis Date   Acute MI ([place])    x4, code blue x3   CAD (coronary artery disease) 2002   Inf STEMI-2002. 2003-cutting balloon + brachytherapy for restenosis; subsequent acute stent thrombosis 06/2010 requiring 2 separate interventions (Zeta  stent, then repeat cath with thrombectomy). focal basal inf AK, nl EF; 03/2011: Patent stents, minor nonobst  residual dz, nl EF; neg stress nuclear in 2008 and stress echo in 2009   Chronic anticoagulation    Warfarin plus ticagrelor   Chronic respiratory failure ([place]) 08/27/2013   On 2L 02   COPD (chronic obstructive pulmonary disease) ([place])    02 dependent   Factor 5 Leiden mutation, heterozygous ([place])    Factor V Leiden, prothrombin gene mutation ([place]) 2006   Hyperlipidemia    Hypothyroidism    Noncompliance    Pelvic fracture ([place]) 2009   Pulmonary embolism ([place]) 2006   Associated with deep vein thrombosis-2006; + factor V Leiden   Tobacco abuse    50 pack years   Significant Hospital Events   7/16 PEA arrest  7/16 intubated  Consults:  Cardiology   Procedures:  Intubation 7/16  Significant Diagnostic Tests:  Endoscopy 05/28/18 > Normal esophagus, normal stomach Bronch 7/12 > Lt lung mucus plug. Friable endobronchial lesion on right bronchus intermedius CXR 7/16> Worsening LLL opacity, possibly atelectasis however can consider PNA/aspiration. Pulmonary edema. Small right pleural effusion  CXR 07/25/2018  IMPRESSION: 1. Effusion and opacity in left base has worsened. The underlying opacity could be atelectasis. However, the findings are at least somewhat concerning for pneumonia or aspiration. Recommend clinical correlation. 2. Small right pleural effusion. 3. Mild pulmonary edema.  ECHO 07/05/2018  FINDINGS Left Ventricle: The left ventricle has normal systolic function, with an ejection fraction of 60-65%. The cavity size was normal. Moderate basal  septal hypertrophy. Otherwise, mild concentric LVH of remaining myocardium. Diastolic dysfunction, grade  indeterminate. Elevated left ventricular end-diastolic pressure No evidence of left ventricular regional wall motion abnormalities.. Right Ventricle: The right ventricle has normal systolic function. The cavity  was normal. There is no increase in right ventricular wall thickness. Left Atrium: Left atrial size was mildly dilated. Right Atrium: Right atrial size was normal in size. Right atrial pressure is estimated at 10 mmHg. Interatrial Septum: No atrial level shunt detected by color flow Doppler. Pericardium: Trivial pericardial effusion is present. Mitral Valve: The mitral valve is degenerative in appearance. Mild thickening of the mitral valve leaflet. Mild calcification of the mitral valve leaflet. There is mild mitral annular calcification present. Mitral valve regurgitation is mild by color  flow Doppler. Tricuspid Valve: The tricuspid valve is grossly normal. Tricuspid valve regurgitation is trivial by color flow Doppler. Aortic Valve: The aortic valve was not well visualized Mild thickening of the aortic valve. Mild calcification of the aortic valve. Aortic valve regurgitation was not visualized by color flow Doppler. Pulmonic Valve: The pulmonic valve was not well visualized. Pulmonic valve regurgitation is not visualized by color flow Doppler. Aorta: The aortic root is normal in size and structure. Venous: The inferior vena cava IVC is dilated. Unable to assess respiratory variation.  Micro Data:  Blood 7/8 >NGTD Sputum 7/11 >few stenotrophomonas  Pleural fluid 7/11>rare WBC, no organisms seen BAL 7/11>Stenotrophomonas Maltophilia >100k colonies.   Antimicrobials:  Unasyn 7/8 >> 7/11 vanc 7/11 >> 7/13 Zosyn 7/11 >>7/13 Bactrim 7/13 >   Interim history/subjective:  Patient doing well this AM. Responds to questions.  Off pressors.  CXR reviewed illustrating complete whiteout of the left hemithorax.   Objective   Blood pressure 138/67, pulse 76, temperature 99.1 F (37.3 C), resp. rate 20, height _0  (1.626 m), weight 70.4 kg, SpO2 99 %.    Vent Mode: PRVC FiO2 (%):  [60 %-100 %] 60 % Set Rate:  [20 bmp-24 bmp] 20 bmp Vt Set:  [430 mL] 430 mL PEEP:  [5 cmH20-10 cmH20] 5  cmH20 Plateau Pressure:  [20 cmH20-28 cmH20] 20 cmH20   Intake/Output Summary (Last 24 hours) at 07/26/2018 0857 Last data filed at 07/26/2018 0800 Gross per 24 hour  Intake 2335.86 ml  Output 4610 ml  Net -2274.14 ml   Filed Weights   07/25/18 0558 07/25/18 2009 07/26/18 0415  Weight: 73.7 kg 69.1 kg 70.4 kg   Examination: General: Obese female in no acute distress HENT: ET tube in place Pulm: Rhonchi on the right with absent breath sounds on the left   CV: RRR, no murmurs, no rubs  Abdomen: Active bowel sounds, soft, non-distended, no tenderness to palpation  Extremities: Pulses palpable in all extremities, no LE edema  Skin: Warm and dry  Neuro: Alert and follows commands  Resolved Hospital Problem list   None  Assessment & Plan:   S/p PEA arrest  Acute on Chronic Hypoxic Hypercarbic Respiratory Failure  - Suspect respiratory driven. HS-troponin and EKG not consistent with acute ischemic event. Limited echocardiogram pending  - FiO2 60%. Wean as able with target SaO2 of 88-92%  - AM ABG 7.42/62/77/42. At baseline with her chronic hypercarbia.  - CXR with complete whiteout of the left hemithorax likely secondary to mucus plug given hx. Will discuss bronch with Dr. Nelda Marseille - Continue brovana and pulmicort. PRN albuterol  - Continue Bractrim for Stenotrophomonas Maltophilia  CAD Atrial Fibrillation  - Continuing on ASA and Brilinta  - Follow-up  echocardiogram  - Cardiology following   Hematemesis 2/2 UGIB  - Continuing warfarin, INR 3.8 this AM  - Continue PPI  - Hgb has remained stable - Will need to follow-up with GI  Best practice:  Diet: NPO Pain/Anxiety/Delirium protocol (if indicated): intermittent fentanyl VAP protocol (if indicated): yes DVT prophylaxis: on Warfarin and Brilinta INR >3 only SCDs  GI prophylaxis: PPI Glucose control: if BG exceeds 144m/dl start ISS Mobility: Bedrest Code Status: FULL>>> needs a palliative consult and [place] conversation Family Communication: notified and on their way to the hospital Disposition: ICU  Labs   CBC: Recent Labs  Lab 07/23/18 0415  07/24/18 0437  07/25/18 0336 07/25/18 1655 07/25/18 1949 07/25/18 2143 07/25/18 2308 07/26/18 0438  WBC 6.9  --  4.5  --  4.5  --  6.9  --   --  7.9  NEUTROABS  --   --  3.5  --   --   --   --   --   --   --   HGB 9.0*   < > 8.8*   < > 8.5* 9.4* 9.0* 9.9* 9.9* 8.8*  HCT 28.9*   < > 28.4*   < > 27.5* 30.5* 29.6* 29.0* 29.0* 27.7*  MCV 98.0  --  97.6  --  97.5  --  100.0  --   --  94.5  PLT PLATELET CLUMPS NOTED ON SMEAR, COUNT APPEARS DECREASED  --  119*  --  146*  --  192  --   --  189   < > = values in this interval not displayed.   Basic Metabolic Panel: Recent Labs  Lab 07/20/18 2211  07/22/18 0520  07/24/18 0437 07/25/18 0336 07/25/18 1825 07/25/18 1949 07/25/18 2143 07/25/18 2308 07/26/18 0431  NA  --    < > 137   < > 138 137 107* 140 135 136 137  K  --    < > 3.2*   < > 3.2* 3.3* 2.7* 3.9 3.6 3.5 3.6  CL  --    < > 102   < > 97* 95* 69* 93*  --   --  89*  CO2  --    < > 24   < > 32 32 27 24  --   --  38*  GLUCOSE  --    < > 75   < > 82 100* 1,069* 173*  --   --  78  BUN  --    < > 13   < > <5* <5* <5* <5*  --   --  7*  CREATININE  --    < > 0.86   < > 0.98 0.92 0.86 1.10*  --   --  1.12*  CALCIUM  --    < > 7.7*   < > 7.9* 7.9* 6.2* 7.9*  --   --  7.9*  MG 1.6*  --  1.7  --   --   --   --   --   --   --  1.6*  PHOS  --   --  2.8  --   --   --   --   --   --   --  2.0*   < > = values in this interval not displayed.   GFR: Estimated Creatinine Clearance: 46.3 mL/min (A) (by C-G formula based on SCr of 1.12 mg/dL (H)). Recent Labs  Lab 07/21/18 0501  07/24/18 0437 07/25/18 0336 07/25/18 1949  07/26/18 0438  WBC  --    < > 4.5 4.5 6.9 7.9  LATICACIDVEN 1.1  --   --   --   --   --    < > = values in this interval not displayed.   Liver Function Tests: Recent Labs  Lab 07/20/18 1154 07/24/18 0437 07/25/18 1949  AST   --  17 40  ALT  --  23 29  ALKPHOS  --  93 107  BILITOT  --  0.5 0.6  PROT 5.0* 4.7* 4.9*  ALBUMIN 2.1* 2.0* 2.1*   No results for input(s): LIPASE, AMYLASE in the last 168 hours. No results for input(s): AMMONIA in the last 168 hours.  ABG    Component Value Date/Time   PHART 7.421 07/25/2018 2308   PCO2ART 62.7 (H) 07/25/2018 2308   PO2ART 77.0 (L) 07/25/2018 2308   HCO3 40.6 (H) 07/25/2018 2308   TCO2 42 (H) 07/25/2018 2308   ACIDBASEDEF 3.6 (H) 07/25/2018 1950   O2SAT 95.0 07/25/2018 2308    Coagulation Profile: Recent Labs  Lab 07/23/18 0415 07/24/18 0437 07/25/18 0336 07/25/18 2000 07/26/18 0431  INR 1.4* 1.7* 2.8* 3.1* 3.8*   Cardiac Enzymes: No results for input(s): CKTOTAL, CKMB, CKMBINDEX, TROPONINI in the last 168 hours.  HbA1C: Hgb A1c MFr Bld  Date/Time Value Ref Range Status  08/25/2013 08:24 PM 6.0 (H) <5.7 % Final    Comment:    (NOTE)                                                                       According to the ADA Clinical Practice Recommendations for 2011, when HbA1c is used as a screening test:  >=6.5%   Diagnostic of Diabetes Mellitus           (if abnormal result is confirmed) 5.7-6.4%   Increased risk of developing Diabetes Mellitus References:Diagnosis and Classification of Diabetes Mellitus,Diabetes Care,2011,34(Suppl 1):S62-S69 and Standards of Medical Care in         Diabetes - 2011,Diabetes DEYC,1448,18 (Suppl 1):S11-S61.  06/22/2010 07:35 PM 5.7 (H) <5.7 % Final    Comment:    (NOTE)                                                                       According to the ADA Clinical Practice Recommendations for 2011, when HbA1c is used as a screening test:  >=6.5%   Diagnostic of Diabetes Mellitus           (if abnormal result is confirmed) 5.7-6.4%   Increased risk of developing Diabetes Mellitus References:Diagnosis and Classification of Diabetes Mellitus,Diabetes HUDJ,4970,26(VZCHY 1):S62-S69 and Standards of Medical  Care in         Diabetes - 2011,Diabetes IFOY,7741,28 (Suppl 1):S11-S61.   CBG: Recent Labs  Lab 07/25/18 2309 07/26/18 0830  GLUCAP 95 101*   Review of Systems:   Unable to obtained. Patient intubated.   Past Medical History  She,  has a past medical history  of Acute MI ([place]), CAD (coronary artery disease) (2002), Chronic anticoagulation, Chronic respiratory failure ([place]) (08/27/2013), COPD (chronic obstructive pulmonary disease) ([place]), Factor 5 Leiden mutation, heterozygous ([place]), Factor V Leiden, prothrombin gene mutation ([place]) (2006), Hyperlipidemia, Hypothyroidism, Noncompliance, Pelvic fracture ([place]) (2009), Pulmonary embolism ([place]) (2006), and Tobacco abuse.   Surgical History    Past Surgical History:  Procedure Laterality Date   COLONOSCOPY  Approximately 2000   Negative screening study   COLONOSCOPY WITH PROPOFOL N/A 05/28/2018   Procedure: COLONOSCOPY WITH PROPOFOL;  Surgeon: Rogene Houston, MD;  Location: [facility];  Service: Endoscopy;  Laterality: N/A;   CORONARY ANGIOPLASTY  2002, 2003, 2012   ESOPHAGOGASTRODUODENOSCOPY (EGD) WITH PROPOFOL N/A 05/26/2018   Procedure: ESOPHAGOGASTRODUODENOSCOPY (EGD) WITH PROPOFOL;  Surgeon: Rogene Houston, MD;  Location: [facility];  Service: Endoscopy;  Laterality: N/A;   LEFT AND RIGHT HEART CATHETERIZATION WITH CORONARY ANGIOGRAM N/A 04/03/2011   Procedure: LEFT AND RIGHT HEART CATHETERIZATION WITH CORONARY ANGIOGRAM;  Surgeon: Sherren Mocha, MD;  Location: [facility] CATH LAB;  Service: Cardiovascular;  Laterality: N/A;   POLYPECTOMY  05/28/2018   Procedure: POLYPECTOMY;  Surgeon: Rogene Houston, MD;  Location: [facility];  Service: Endoscopy;;  cold snare and biopsy forcep    Social History   reports that she has been smoking cigarettes. She started smoking about 50 years ago. She has a 25.00 pack-year smoking history. She has never used smokeless tobacco. She reports that she does not drink alcohol or use drugs.    Family History   Her family history includes Alzheimer's disease in her mother; Emphysema in her mother and sister; Factor V Leiden deficiency in her daughter, father, and sister; Heart disease in her father; Kidney cancer in her paternal grandmother.   Allergies Allergies  Allergen Reactions   Dilaudid [Hydromorphone Hcl] Hives and Nausea Only   Minocycline Hcl     REACTION: Dizzy   Prednisone     REACTION: feels like throat swelling, hallucinations   Varenicline Tartrate     REACTION: Dizzy(chantix)    Zocor [Simvastatin - High Dose] Other (See Comments)    myalgia    Home Medications  Prior to Admission medications   Medication Sig Start Date End Date Taking? Authorizing Provider  albuterol (PROVENTIL) (2.5 MG/3ML) 0.083% nebulizer solution USE 1 VIAL BY NEBULIZER EVERY 4 HOURS AS NEEDED FOR WHEEZING. DX: J44.9 Patient taking differently: Take 2.5 mg by nebulization every 4 (four) hours as needed for wheezing or shortness of breath.  01/24/17  Yes Magdalen Spatz, NP  arformoterol (BROVANA) 15 MCG/2ML NEBU Take 2 mLs (15 mcg total) by nebulization 2 (two) times daily. 09/07/17  Yes Juanito Doom, MD  baclofen (LIORESAL) 10 MG tablet Take 1 tablet (10 mg total) by mouth 3 (three) times daily. 01/24/18  Yes Lavina Hamman, MD  bisacodyl (DULCOLAX) 5 MG EC tablet Take 1 tablet (5 mg total) by mouth daily as needed for mild constipation. 05/29/18  Yes Shah, Pratik D, DO  bisoprolol (ZEBETA) 5 MG tablet Take 0.5 tablets (2.5 mg total) by mouth daily. 07/12/18 08/11/18 Yes Shah, Pratik D, DO  budesonide (PULMICORT) 0.5 MG/2ML nebulizer solution Take 2 mLs (0.5 mg total) by nebulization 2 (two) times daily. Dx: J43.9 01/24/17  Yes Magdalen Spatz, NP  buPROPion [facility] SR) 100 MG 12 hr tablet Take 100 mg by mouth every morning.   Yes [provider]  citalopram (CELEXA) 10 MG tablet Take 10 mg by mouth daily.  04/29/18  Yes [provider]  enoxaparin (LOVENOX) 120  MG/0.8ML injection Inject 0.73 mLs (110 mg total) into the skin daily for 10 days. 07/11/18 07/21/18 Yes Shah, Pratik D, DO  Glycerin-Hypromellose-PEG 400 (DRY EYE RELIEF DROPS) 0.2-0.2-1 % SOLN Place 1-2 drops into both eyes 2 (two) times a day.   Yes [provider]  HYDROcodone-acetaminophen (NORCO/VICODIN) 5-325 MG tablet Take 1 tablet by mouth every 6 (six) hours as needed (BREAKTHROUGH PAIN). 02/19/18  Yes Johnson, Clanford L, MD  ipratropium-albuterol (DUONEB) 0.5-2.5 (3) MG/3ML SOLN Take 3 mLs by nebulization 4 (four) times daily. 03/20/17  Yes Magdalen Spatz, NP  levothyroxine (SYNTHROID, LEVOTHROID) 125 MCG tablet Take 1 tablet (125 mcg total) by mouth daily. 08/16/17  Yes Nafziger, Tommi Rumps, NP  nitroGLYCERIN (NITROSTAT) 0.4 MG SL tablet Place 1 tablet (0.4 mg total) under the tongue every 5 (five) minutes as needed for chest pain. 10/28/14  Yes Herminio Commons, MD  Omega-3 Fatty Acids (FISH OIL) 600 MG CAPS Take 1 capsule by mouth daily.    Yes [provider]  pantoprazole (PROTONIX) 40 MG tablet Take 1 tablet (40 mg total) by mouth daily. Patient taking differently: Take 40 mg by mouth daily as needed (reflux/heartburn).  02/20/18  Yes Johnson, Clanford L, MD  rosuvastatin (CRESTOR) 5 MG tablet Take 1 tablet (5 mg total) by mouth daily at 6 PM. 07/11/18 08/10/18 Yes Shah, Pratik D, DO  ticagrelor (BRILINTA) 60 MG TABS tablet Take 1 tablet (60 mg total) by mouth 2 (two) times daily. 02/01/18  Yes Johnson, Clanford L, MD  traZODone (DESYREL) 50 MG tablet Take 50 mg by mouth at bedtime as needed for sleep.   Yes [provider]  warfarin (COUMADIN) 1 MG tablet Take 5-6 mg by mouth See admin instructions. On Sunday, Tuesday, Thursday, and Saturday, patient takes 32m; on all other days, patient takes 525m  Yes [provider]  ferrous sulfate 325 (65 FE) MG tablet Take 1 tablet (325 mg total) by mouth daily with breakfast. Patient not taking: Reported on 07/19/2018  07/11/18 08/10/18  ShHeath Lark, DO  Respiratory Therapy Supplies (FLUTTER) DEVI Use as directed 05/14/15   NeJavier GlazierMD        JuIna HomesMD IMTS PGY3 Pager [phone] Note:  [age]ear old female with a left main stem obstruction, respiratory failure and cardiac arrest.  No further events since transfer to the ICU.  On exam, no BS on the left.  I reviewed CXR myself, left lung whiteout with ETT is in a good position.  Discussed with PCCM-NP.  Performed bronchoscopy today, left mainstem is obstructed with a combination of blood clot and mucus.  Attempted for 45 to remove the obstruction completely but was only able to remove some of it.  Will allow more time for breakdown of the obstruction and reattempt bronch in AM and frequent suction.  Continue full vent support.  Titrate O2 for sat of 88-92% and PEEP to 5.  Hold off weaning.  Diureses as BP and renal function allows.  Replace electrolytes.  The patient is critically ill with multiple organ systems failure and requires high complexity decision making for assessment and support, frequent evaluation and titration of therapies, application of advanced monitoring technologies and extensive interpretation of multiple databases.   Critical Care Time devoted to patient care services described in this note is  32  Minutes. This time reflects time of care of this signee Dr WeJennet MaduroThis critical  care time does not reflect procedure time, or teaching time or supervisory time of PA/NP/Med student/Med Resident etc but could involve care discussion time.  Rush Farmer, M.D. [facility] Pulmonary/Critical Care Medicine. Pager: [phone]. After hours pager: [phone].

## 2018-07-26 NOTE — Progress Notes (Signed)
PT Cancellation Note  Patient Details Name: [name] MRN: [id number] DOB: 1950/08/15   Cancelled Treatment:    Reason Eval/Treat Not Completed: Medical issues which prohibited therapy PEA arrest yesterday after we received OT/PT order. Spoke with RN who reports pt is on vent but not sedated, but also not following any commands--will open eyes briefly when name is called. RN also reports pt to get bronched today at some point. Will check back as able and proceed with evaluation when pt is ready.    Godfrey Pick Shakisha Abend 07/26/2018, 11:02 AM [facility] Pager:  [phone]  Office:  [phone]

## 2018-07-26 NOTE — Progress Notes (Signed)
Nutrition Follow-up  DOCUMENTATION CODES:   Non-severe (moderate) malnutrition in context of chronic illness  INTERVENTION:   Tube feeding via OGT: - Start Vital AF 1.2 @ 20 ml/hr and increase rate by 10 ml q 4 hours until goal rate of 50 ml/hr (1200 ml/day)  Tube feeding regimen at goal provides 1440 kcal, 90 grams of protein, and 973 ml of H2O.  Monitor magnesium, potassium, and phosphorus daily for at least 3 days, MD to replete as needed, as pt is at risk for refeeding syndrome given inadequate nutrition x 9 days.  - d/c MVI with minerals as TF provides 100% of RDIs  NUTRITION DIAGNOSIS:   Moderate Malnutrition related to chronic illness (COPD) as evidenced by mild fat depletion, mild muscle depletion, moderate muscle depletion, percent weight loss (16.9% weight loss in 5 months).  Ongoing, being addressed via TF  GOAL:   Patient will meet greater than or equal to 90% of their needs  Met via TF at goal  MONITOR:   Labs, I & O's, Weight trends, TF tolerance, Skin, Vent status  REASON FOR ASSESSMENT:   Consult Enteral/tube feeding initiation and management  ASSESSMENT:   68 year old female who presented to the ED on 7/07 with large volume hematemesis. Pt became unresponsive in PEA arrest and CPR initiated. ROSC was achieved. Pt required intubation in the ED. PMH of MI, factor V Leiden, COPD. TTM not pursued as pt was following commands post arrest.  7/09 - extubated, clear liquid diet 7/11 - returned to ICU for mucus plug, right lung collapse, s/p thoracentesis, bronchoscopy revealing RML mass 7/13 - bronchoscopy, extubated 7/14 - diet briefly advanced to Dysphagia 3 then back to NPO 7/16 - 2 episodes of respiratory distress, chest x-rays revealing pulmonary edema, hemoptysis, Code Blue due to PEA arrest x 2, intubated, no TTM 7/17 - bronchoscopy revealing left mainstem obstructed with blood clot and mucus  Discussed pt with RN and during ICU rounds.  Discussed pt  with CCM. Verbal with readback order received to place consult for intiation and management of tube feeds. Discussed plan with RN.  Weight stable since admission with some fluctuations. Will continue to monitor trends.  OGT in place, currently to low intermittent suction.  Patient is currently intubated on ventilator support MV: 8.9 L/min Temp (24hrs), Avg:99.3 F (37.4 C), Min:97 F (36.1 C), Max:100.2 F (37.9 C)  Drips: Levophed: 37.5 ml/hr NS: 10 ml/hr  Medications reviewed and include: Lasix, MVI with minerals, Protonix, KCl 40 mEq x 3, warfarin, IV magnesium sulfate 4 grams once, IV potassium phosphate 30 mmol once, IV abx  Labs reviewed: hemoglobin 8.8, CO2 38, creatinine 1.12, phosphorus 2.0, magnesium 1.6 CBG's: 95-158 x 24 hours  UOP: 4435 ml x 24 hours I/O's: +3.2 L since admit  Diet Order:   Diet Order            Diet NPO time specified  Diet effective now              EDUCATION NEEDS:   No education needs have been identified at this time  Skin:  Skin Assessment: Skin Integrity Issues: DTI: buttocks  Last BM:  no documented BM  Height:   Ht Readings from Last 1 Encounters:  07/16/18 5' 4"  (1.626 m)    Weight:   Wt Readings from Last 1 Encounters:  07/26/18 70.4 kg    Ideal Body Weight:  54.5 kg  BMI:  Body mass index is 26.64 kg/m.  Estimated Nutritional Needs:   Kcal:  1485  Protein:  85-100 grams  Fluid:  >/=1.7 L    Gaynell Face, MS, RD, LDN Inpatient Clinical Dietitian Pager: [phone] Weekend/After Hours: [phone]

## 2018-07-26 NOTE — Progress Notes (Signed)
ANTICOAGULATION CONSULT NOTE - Follow Up Consult  Pharmacy Consult for Warfarin Indication: atrial fibrillation  Allergies  Allergen Reactions  . Dilaudid [Hydromorphone Hcl] Hives and Nausea Only  . Minocycline Hcl     REACTION: Dizzy  . Prednisone     REACTION: feels like throat swelling, hallucinations  . Varenicline Tartrate     REACTION: Dizzy(chantix)   . Zocor [Simvastatin - High Dose] Other (See Comments)    myalgia    Patient Measurements: Height: 5\' 4"  (162.6 cm) Weight: 155 lb 3.3 oz (70.4 kg) IBW/kg (Calculated) : 54.7  Vital Signs: Temp: 98.4 F (36.9 C) (07/17 1300) Temp Source: Core (07/17 1125) BP: 89/43 (07/17 1300) Pulse Rate: 72 (07/17 1300)  Labs: Recent Labs    07/25/18 0336 07/25/18 1605  07/25/18 1825 07/25/18 1949 07/25/18 2000 07/25/18 2143 07/25/18 2308 07/26/18 0431 07/26/18 0438  HGB 8.5*  --    < >  --  9.0*  --  9.9* 9.9*  --  8.8*  HCT 27.5*  --    < >  --  29.6*  --  29.0* 29.0*  --  27.7*  PLT 146*  --   --   --  192  --   --   --   --  189  APTT  --   --   --   --   --  32  --   --   --   --   LABPROT 29.3*  --   --   --   --  31.6*  --   --  36.9*  --   INR 2.8*  --   --   --   --  3.1*  --   --  3.8*  --   HEPARINUNFRC 0.15*  --   --   --   --   --   --   --   --   --   CREATININE 0.92  --   --  0.86 1.10*  --   --   --  1.12*  --   TROPONINIHS  --  14  --  12 34*  --   --   --   --   --    < > = values in this interval not displayed.    Estimated Creatinine Clearance: 46.3 mL/min (A) (by C-G formula based on SCr of 1.12 mg/dL (H)).  Assessment:  68 yr old female with hx Factor V Leiden on warfarin PTA which was reversed with [place] and Vitamin K on 07/17/18 d/t coagulapathy. No current plans for EGD per GI so CCM ordered anticoagulation to be restarted on 7/13.   IV heparin begun 7/15 pm, then stopped 7/16 am due to hemoptysis and INR up to 2.8, likely due to concurrent Bactrim IV for Stenotrophomonas pneumonia. Code blue  overnight, INR has trended up to 3.8.   Will hold warfarin for now. Hgb stable overall at 8.8.   PTA warfarin:  5 mg MWFSun, 6 mg TTSat.    Patient continues on bactrim for stenotrophomonas infection. Tmax 100.2, wbc wnl. Scr slowly trending up to 1.1. Will make adjustment to bactrim dosing.   Goal of Therapy:  INR 2-3 Monitor platelets by anticoagulation protocol: Yes   Plan:   No warfarin tonight.  Daily PT/INR.  Will follow up for resuming warfarin when tube placed or able to take PO.  Watch for sensitivity to warfarin with concurrent Bactrim. Bactrim 320mg  IV q8 hours (~4.6mg /kg q8)  Pilar Plate  Redmond Pulling PharmD., BCPS Clinical Pharmacist 07/26/2018 1:39 PM

## 2018-07-26 NOTE — Procedures (Signed)
Bronchoscopy Procedure Note Marthe Dant [id number] Dec 18, 1950  Procedure: Bronchoscopy Indications: Diagnostic evaluation of the airways and Remove secretions  Procedure Details Consent: Unable to obtain consent because of emergent medical necessity. Time Out: Verified patient identification, verified procedure, site/side was marked, verified correct patient position, special equipment/implants available, medications/allergies/relevent history reviewed, required imaging and test results available.  Performed  In preparation for procedure, patient was given 100% FiO2 and bronchoscope lubricated. Sedation: Versed, fentanyl and etomidate  Airway entered and the following bronchi were examined: RUL, RML, RLL, LUL, LLL and Bronchi.   Procedures performed: Brushings performed Bronchoscope removed.  , Patient placed back on 100% FiO2 at conclusion of procedure.    Evaluation Hemodynamic Status: BP stable throughout; O2 sats: stable throughout Patient's Current Condition: stable Specimens:  None Complications: No apparent complications Patient did tolerate procedure well.   Jennet Maduro 07/26/2018

## 2018-07-26 NOTE — Progress Notes (Signed)
Pt's temp 100.2 with orders to keep pt normothermic.  Cooling blanket ordered however none available.  Ice packs applied to patient. Will continue to monitor.

## 2018-07-26 NOTE — Procedures (Signed)
Bedside Bronchoscopy Procedure Note [name] [id number] [birth year]  Procedure: Bronchoscopy Indications: Remove secretions  Procedure Details: ET Tube Size: ET Tube secured at lip (cm): Bite block in place: No In preparation for procedure, Patient hyper-oxygenated with 100 % FiO2 and Saline given via ETT (30 ml) Airway entered and the following bronchi were examined: Bronchi.   Bronchoscope removed.  , Patient placed back on 100% FiO2 at conclusion of procedure.    Evaluation BP 138/67   Pulse 76   Temp 99.1 F (37.3 C)   Resp 20   Ht 5\' 4"  (1.626 m)   Wt 70.4 kg   SpO2 100%   BMI 26.64 kg/m  Breath Sounds:Diminished O2 sats: stable throughout Patient's Current Condition: stable Specimens:  None Complications: No apparent complications Patient did tolerate procedure well.   Littie Deeds [facility] [place] 07/26/2018, 10:38 AM

## 2018-07-26 NOTE — Plan of Care (Signed)
  Problem: Clinical Measurements: Goal: Respiratory complications will improve Outcome: Progressing   Problem: Clinical Measurements: Goal: Cardiovascular complication will be avoided Outcome: Progressing   Problem: Respiratory: Goal: Ability to maintain a clear airway and adequate ventilation will improve Outcome: Progressing

## 2018-07-26 NOTE — Progress Notes (Signed)
Pt does not tolerate being turned to the left. O2 sat dropped to 70s.

## 2018-07-26 NOTE — Progress Notes (Signed)
Echocardiogram 2D Echocardiogram has been performed.  Oneal Deputy Arjay Jaskiewicz 07/26/2018, 9:10 AM

## 2018-07-26 NOTE — Progress Notes (Signed)
CRITICAL VALUE ALERT  Critical Value:  Trop-34  Date & Time Notied:  07/25/18 @ 2048  Provider Notified: Dr. Gilford Raid  Orders Received/Actions taken: none at this time

## 2018-07-26 NOTE — Progress Notes (Signed)
Pts wallets(3), phone, and necklace sent home with dtr [name].

## 2018-07-26 NOTE — Progress Notes (Signed)
OT Cancellation Note  Patient Details Name: [name] MRN: [id number] DOB: October 28, 1950   Cancelled Treatment:    Reason Eval/Treat Not Completed: Patient not medically ready. Pt with PEA arrest yesterday after we received OT/PT order. Spoke with RN who reports pt is on vent but not sedated, but also not following any commands--will open eyes briefly when name is called. RN also reports pt to get bronched today at some point. We will hold on eval for now and re-check on Monday for appropriateness for eval.  Golden Circle, OTR/L Acute Rehab Services Pager [phone] Office [phone]     Almon Register 07/26/2018, 9:28 AM

## 2018-07-26 NOTE — Plan of Care (Signed)
  Problem: Pain Managment: Goal: General experience of comfort will improve Outcome: Progressing   

## 2018-07-26 NOTE — Progress Notes (Signed)
Chart reviewed. Patient had an additional arrest on 07/25/2018.  Echocardiogram today shows new regional wall motion abnormality.   This is a very challenging situation. She has known CAD, and post arrest now appears to have a new regional wall motion abnormality. With her critical illness and bleeding, she is not a candidate for coronary interventions, due to inability to provide dual antiplatelet therapy or heparin safely. In addition, medical therapy for coronary artery disease will be extremely challenging in the setting of repeated episodes of hypotension and likely respiratory driven arrest.   Cardiology will sign off at this time, however do not hesitate to contact us with further questions or concerns.

## 2018-07-27 LAB — PROTIME-INR
INR: 4.1 (ref 0.8–1.2)
Prothrombin Time: 38.8 seconds — ABNORMAL HIGH (ref 11.4–15.2)

## 2018-07-27 LAB — CBC
HCT: 26.2 % — ABNORMAL LOW (ref 36.0–46.0)
Hemoglobin: 8.7 g/dL — ABNORMAL LOW (ref 12.0–15.0)
MCH: 30.4 pg (ref 26.0–34.0)
MCHC: 33.2 g/dL (ref 30.0–36.0)
MCV: 91.6 fL (ref 80.0–100.0)
Platelets: 244 10*3/uL (ref 150–400)
RBC: 2.86 MIL/uL — ABNORMAL LOW (ref 3.87–5.11)
RDW: 14.7 % (ref 11.5–15.5)
WBC: 8 10*3/uL (ref 4.0–10.5)
nRBC: 0 % (ref 0.0–0.2)

## 2018-07-27 LAB — RENAL FUNCTION PANEL
Albumin: 2.3 g/dL — ABNORMAL LOW (ref 3.5–5.0)
Anion gap: 11 (ref 5–15)
BUN: 9 mg/dL (ref 8–23)
CO2: 39 mmol/L — ABNORMAL HIGH (ref 22–32)
Calcium: 7.8 mg/dL — ABNORMAL LOW (ref 8.9–10.3)
Chloride: 86 mmol/L — ABNORMAL LOW (ref 98–111)
Creatinine, Ser: 1.37 mg/dL — ABNORMAL HIGH (ref 0.44–1.00)
GFR calc Af Amer: 46 mL/min — ABNORMAL LOW (ref >60)
GFR calc non Af Amer: 40 mL/min — ABNORMAL LOW (ref >60)
Glucose, Bld: 114 mg/dL — ABNORMAL HIGH (ref 70–99)
Phosphorus: 4 mg/dL (ref 2.5–4.6)
Potassium: 4 mmol/L (ref 3.5–5.1)
Sodium: 136 mmol/L (ref 135–145)

## 2018-07-27 LAB — MAGNESIUM: Magnesium: 2.1 mg/dL (ref 1.7–2.4)

## 2018-07-27 MED ORDER — DEXMEDETOMIDINE HCL IN NACL 200 MCG/50ML IV SOLN
0.0000 ug/kg/h | INTRAVENOUS | Status: AC
  Administered 2018-07-27: 0.4 ug/kg/h via INTRAVENOUS
  Administered 2018-07-27 – 2018-07-28 (×10): 0.8 ug/kg/h via INTRAVENOUS
  Administered 2018-07-29 (×2): 0.801 ug/kg/h via INTRAVENOUS
  Administered 2018-07-29 (×2): 0.8 ug/kg/h via INTRAVENOUS
  Administered 2018-07-29: 0.801 ug/kg/h via INTRAVENOUS
  Administered 2018-07-29 – 2018-07-30 (×3): 0.8 ug/kg/h via INTRAVENOUS
  Filled 2018-07-27 (×7): qty 50
  Filled 2018-07-27: qty 100
  Filled 2018-07-27 (×11): qty 50

## 2018-07-27 MED ORDER — CHLORHEXIDINE GLUCONATE CLOTH 2 % EX PADS
6.0000 | MEDICATED_PAD | Freq: Every day | CUTANEOUS | Status: DC
  Administered 2018-07-28 – 2018-07-31 (×5): 6 via TOPICAL

## 2018-07-27 MED ORDER — LEVOTHYROXINE SODIUM 25 MCG PO TABS
125.0000 ug | ORAL_TABLET | Freq: Every day | ORAL | Status: DC
  Administered 2018-07-28 – 2018-08-01 (×4): 125 ug
  Filled 2018-07-27 (×4): qty 1

## 2018-07-27 MED ORDER — MIDAZOLAM HCL 2 MG/2ML IJ SOLN
1.0000 mg | INTRAMUSCULAR | Status: DC | PRN
  Administered 2018-07-27 – 2018-07-30 (×14): 1 mg via INTRAVENOUS
  Filled 2018-07-27 (×16): qty 2

## 2018-07-27 MED ORDER — PANTOPRAZOLE SODIUM 40 MG PO PACK
40.0000 mg | PACK | Freq: Two times a day (BID) | ORAL | Status: DC
  Administered 2018-07-27 – 2018-07-31 (×7): 40 mg
  Filled 2018-07-27 (×9): qty 20

## 2018-07-27 NOTE — Progress Notes (Addendum)
CRITICAL VALUE ALERT  Critical Value: PT/INR 38.8/4.1   Date & Time Notied:  07/27/2018 0400   Provider Notified: Warren Lacy PCCM  Orders Received/Actions taken: N/A

## 2018-07-27 NOTE — Progress Notes (Signed)
NAME:  [name], MRN:  [id number], DOB:  06-12-1950, LOS: 56 ADMISSION DATE:  2018-08-01, CONSULTATION DATE:  07/25/2018 REFERRING MD:  Marthenia Rolling MD, CHIEF COMPLAINT: S/p cardiac arrest    Brief History   68 yo F w/ PMHx Factor V Leiden on warfarin known to PCCM service seen earlier today. Re-consulted due to cardiac arrest  History of present illness   (History from account of other providers and EMR)  68 yr old with COPD, MI, HTN, and PE, Factor V Leiden on Warfarin (also on Brillinta) presented with large volume hematemesis on late hrs of 2018/08/01. Coded in [facility] ED for 12 minutes PEA. Coagulopathy reversed with Vit K, [place]. Transferred to [facility] started on PPI. Received transfusions and GI consulted for EGD ( wanted pt to be off AC for 5 days). Bronchoscopy on 7/13 showed a Extubated on 7/9 and transferred out to Internal Medicine service. Back to ICU 7/11 for mucus plug and lt lung collapse s/p thoracentesis was re-intubated and has a bronchoscopy on 7/11/ showed the whole left mainstem bronchus was occluded by mucous plug and thick secretions which extended all the way into the lower lobe. It was suctioned out and BAL was sent which was positive for Stenotrophomonas . Repeat Bronchosopy on 7/13 prior to extubation was negative. Extubated successfully on 7/13 and downgraded to Internal Medicine.   PCCM re-engaged 7/16 for increasing oxygen demand. Clinically appears to be pulm edema. Plan for diuresis and aim to support, hopefully avoid intubation. Evening of 7/16 experienced PEA arrest. Got epi, atropine, and dopamine. Intubated and transferred to the ICU for further evaluation and management.   Past Medical History   Past Medical History:  Diagnosis Date  . Acute MI ([place])    x4, code blue x3  . CAD (coronary artery disease) 2002   Inf STEMI-2002. 2003-cutting balloon + brachytherapy for restenosis; subsequent acute stent thrombosis 06/2010 requiring 2 separate interventions (Zeta  stent, then repeat cath with thrombectomy). focal basal inf AK, nl EF; 03/2011: Patent stents, minor nonobst  residual dz, nl EF; neg stress nuclear in 2008 and stress echo in 2009  . Chronic anticoagulation    Warfarin plus ticagrelor  . Chronic respiratory failure ([place]) 08/27/2013   On 2L 02  . COPD (chronic obstructive pulmonary disease) ([place])    02 dependent  . Factor 5 Leiden mutation, heterozygous ([place])   . Factor V Leiden, prothrombin gene mutation ([place]) 2006  . Hyperlipidemia   . Hypothyroidism   . Noncompliance   . Pelvic fracture ([place]) 2009  . Pulmonary embolism ([place]) 2006   Associated with deep vein thrombosis-2006; + factor V Leiden  . Tobacco abuse    50 pack years   Significant Hospital Events   7/16 PEA arrest  7/16 intubated  Consults:  Cardiology   Procedures:  Intubation 7/16  Significant Diagnostic Tests:  Endoscopy 05/28/18 > Normal esophagus, normal stomach Bronch 7/12 > Lt lung mucus plug. Friable endobronchial lesion on right bronchus intermedius CXR 7/16> Worsening LLL opacity, possibly atelectasis however can consider PNA/aspiration. Pulmonary edema. Small right pleural effusion  CXR 07/25/2018  IMPRESSION: 1. Effusion and opacity in left base has worsened. The underlying opacity could be atelectasis. However, the findings are at least somewhat concerning for pneumonia or aspiration. Recommend clinical correlation. 2. Small right pleural effusion. 3. Mild pulmonary edema.  ECHO 07/05/2018  FINDINGS Left Ventricle: The left ventricle has normal systolic function, with an ejection fraction of 60-65%. The cavity size was normal. Moderate basal  septal hypertrophy. Otherwise, mild concentric LVH of remaining myocardium. Diastolic dysfunction, grade  indeterminate. Elevated left ventricular end-diastolic pressure No evidence of left ventricular regional wall motion abnormalities.. Right Ventricle: The right ventricle has normal systolic function. The cavity  was normal. There is no increase in right ventricular wall thickness. Left Atrium: Left atrial size was mildly dilated. Right Atrium: Right atrial size was normal in size. Right atrial pressure is estimated at 10 mmHg. Interatrial Septum: No atrial level shunt detected by color flow Doppler. Pericardium: Trivial pericardial effusion is present. Mitral Valve: The mitral valve is degenerative in appearance. Mild thickening of the mitral valve leaflet. Mild calcification of the mitral valve leaflet. There is mild mitral annular calcification present. Mitral valve regurgitation is mild by color  flow Doppler. Tricuspid Valve: The tricuspid valve is grossly normal. Tricuspid valve regurgitation is trivial by color flow Doppler. Aortic Valve: The aortic valve was not well visualized Mild thickening of the aortic valve. Mild calcification of the aortic valve. Aortic valve regurgitation was not visualized by color flow Doppler. Pulmonic Valve: The pulmonic valve was not well visualized. Pulmonic valve regurgitation is not visualized by color flow Doppler. Aorta: The aortic root is normal in size and structure. Venous: The inferior vena cava IVC is dilated. Unable to assess respiratory variation.  Micro Data:  Blood 7/8 >NGTD Sputum 7/11 >few stenotrophomonas  Pleural fluid 7/11>rare WBC, no organisms seen BAL 7/11>Stenotrophomonas Maltophilia >100k colonies.   Antimicrobials:  Unasyn 7/8 >> 7/11 vanc 7/11 >> 7/13 Zosyn 7/11 >>7/13 Bactrim 7/13 >   Interim history/subjective:  Only mild improvement L complete atx 7/17, no CXR this am New wall motion abnormality noted on TTE 7/17, cardiology indicates no interventions can be made, at least at this time.   Objective   Blood pressure (!) 96/44, pulse 88, temperature 99 F (37.2 C), resp. rate (!) 21, height _0  (1.626 m), weight 66.4 kg, SpO2 91 %.    Vent Mode: PRVC FiO2 (%):  [50 %-70 %] 70 % Set Rate:  [20 bmp] 20 bmp Vt Set:  [430  mL] 430 mL PEEP:  [5 cmH20-8 cmH20] 8 cmH20 Plateau Pressure:  [17 cmH20-27 cmH20] 22 cmH20   Intake/Output Summary (Last 24 hours) at 07/27/2018 0918 Last data filed at 07/27/2018 0900 Gross per 24 hour  Intake 3670.67 ml  Output 5185 ml  Net -1514.33 ml   Filed Weights   07/25/18 2009 07/26/18 0415 07/27/18 0500  Weight: 69.1 kg 70.4 kg 66.4 kg   Examination: General: Intubated, awake HENT: ET tube in place Pulm: no breath sounds on the left, right for the most part clear CV: Regular, no murmur Abdomen: Soft, nondistended, positive bowel sounds Extremities: No significant edema Skin: No rash Neuro: Alert, awake, nods to questions and follows commands.  Some mild agitation, picking at her lines and tubes  Resolved Hospital Problem list   None  Assessment & Plan:   S/p PEA arrest  Acute on Chronic Hypoxic Hypercarbic Respiratory Failure  Severe L atelectasis / collapse - Suspect respiratory driven. HS-troponin and EKG not consistent with acute ischemic event. - push pulmonary hygiene, secretion mobilization. May require repeat FOB for clearance in next few days if ineffective. Repeat CXR -wean PEEP and FiO2 as able.  -BD's as ordered -remains on bactrim for Stenotrophomonas    CAD; new wall motion abnormality on TTE 7/17 Atrial Fibrillation  - Continuing on ASA and Brilinta  - new wall motion abnormality in setting arrest, ? primary cause decompensation  or secondary.  Likely the latter.  No intervention possible per Cards at least for now. They have signed off.   Hematemesis 2/2 UGIB  -Remains on warfarin per pharmacy, INR rising.  May require vitamin K -PPI as ordered -Follow CBC, coags -GI follow-up when stable to do so  Agitation -Initiate low-dose Precedex, continue fentanyl pushes as needed  Best practice:  Diet: NPO Pain/Anxiety/Delirium protocol (if indicated): intermittent fentanyl, add precedex VAP protocol (if indicated): yes DVT prophylaxis: on  Warfarin and Brilinta INR >3; only SCDs  GI prophylaxis: PPI Glucose control: at goal on no Rx Mobility: Bedrest Code Status: FULL>>> needs a palliative consult and [place] conversation Family Communication: discussed 7/17 Disposition: ICU  Labs   CBC: Recent Labs  Lab 07/24/18 0437  07/25/18 0336  07/25/18 1949 07/25/18 2143 07/25/18 2308 07/26/18 0438 07/26/18 1714 07/27/18 0239  WBC 4.5  --  4.5  --  6.9  --   --  7.9  --  8.0  NEUTROABS 3.5  --   --   --   --   --   --   --   --   --   HGB 8.8*   < > 8.5*   < > 9.0* 9.9* 9.9* 8.8* 8.6* 8.7*  HCT 28.4*   < > 27.5*   < > 29.6* 29.0* 29.0* 27.7* 26.1* 26.2*  MCV 97.6  --  97.5  --  100.0  --   --  94.5  --  91.6  PLT 119*  --  146*  --  192  --   --  189  --  244   < > = values in this interval not displayed.   Basic Metabolic Panel: Recent Labs  Lab 07/20/18 2211  07/22/18 0520  07/25/18 0336 07/25/18 1825 07/25/18 1949 07/25/18 2143 07/25/18 2308 07/26/18 0431 07/27/18 0239  NA  --    < > 137   < > 137 107* 140 135 136 137 136  K  --    < > 3.2*   < > 3.3* 2.7* 3.9 3.6 3.5 3.6 4.0  CL  --    < > 102   < > 95* 69* 93*  --   --  89* 86*  CO2  --    < > 24   < > 32 27 24  --   --  38* 39*  GLUCOSE  --    < > 75   < > 100* 1,069* 173*  --   --  78 114*  BUN  --    < > 13   < > <5* <5* <5*  --   --  7* 9  CREATININE  --    < > 0.86   < > 0.92 0.86 1.10*  --   --  1.12* 1.37*  CALCIUM  --    < > 7.7*   < > 7.9* 6.2* 7.9*  --   --  7.9* 7.8*  MG 1.6*  --  1.7  --   --   --   --   --   --  1.6* 2.1  PHOS  --   --  2.8  --   --   --   --   --   --  2.0* 4.0   < > = values in this interval not displayed.   GFR: Estimated Creatinine Clearance: 36.9 mL/min (A) (by C-G formula based on SCr of 1.37 mg/dL (H)). Recent Labs  Lab 07/21/18 0501  07/25/18 0336 07/25/18 1949 07/26/18 0438 07/27/18 0239  WBC  --    < > 4.5 6.9 7.9 8.0  LATICACIDVEN 1.1  --   --   --   --   --    < > = values in this interval not displayed.    Liver Function Tests: Recent Labs  Lab 07/20/18 1154 07/24/18 0437 07/25/18 1949 07/27/18 0239  AST  --  17 40  --   ALT  --  23 29  --   ALKPHOS  --  93 107  --   BILITOT  --  0.5 0.6  --   PROT 5.0* 4.7* 4.9*  --   ALBUMIN 2.1* 2.0* 2.1* 2.3*   No results for input(s): LIPASE, AMYLASE in the last 168 hours. No results for input(s): AMMONIA in the last 168 hours.  ABG    Component Value Date/Time   PHART 7.421 07/25/2018 2308   PCO2ART 62.7 (H) 07/25/2018 2308   PO2ART 77.0 (L) 07/25/2018 2308   HCO3 40.6 (H) 07/25/2018 2308   TCO2 42 (H) 07/25/2018 2308   ACIDBASEDEF 3.6 (H) 07/25/2018 1950   O2SAT 95.0 07/25/2018 2308    Coagulation Profile: Recent Labs  Lab 07/24/18 0437 07/25/18 0336 07/25/18 2000 07/26/18 0431 07/27/18 0239  INR 1.7* 2.8* 3.1* 3.8* 4.1*   Cardiac Enzymes: No results for input(s): CKTOTAL, CKMB, CKMBINDEX, TROPONINI in the last 168 hours.  HbA1C: Hgb A1c MFr Bld  Date/Time Value Ref Range Status  08/25/2013 08:24 PM 6.0 (H) <5.7 % Final    Comment:    (NOTE)                                                                       According to the ADA Clinical Practice Recommendations for 2011, when HbA1c is used as a screening test:  >=6.5%   Diagnostic of Diabetes Mellitus           (if abnormal result is confirmed) 5.7-6.4%   Increased risk of developing Diabetes Mellitus References:Diagnosis and Classification of Diabetes Mellitus,Diabetes Care,2011,34(Suppl 1):S62-S69 and Standards of Medical Care in         Diabetes - 2011,Diabetes BDZH,2992,42 (Suppl 1):S11-S61.  06/22/2010 07:35 PM 5.7 (H) <5.7 % Final    Comment:    (NOTE)                                                                       According to the ADA Clinical Practice Recommendations for 2011, when HbA1c is used as a screening test:  >=6.5%   Diagnostic of Diabetes Mellitus           (if abnormal result is confirmed) 5.7-6.4%   Increased risk of developing  Diabetes Mellitus References:Diagnosis and Classification of Diabetes Mellitus,Diabetes ASTM,1962,22(LNLGX 1):S62-S69 and Standards of Medical Care in         Diabetes - 2011,Diabetes QJJH,4174,08 (Suppl 1):S11-S61.   CBG: Recent Labs  Lab 07/25/18 2309 07/26/18 0830  GLUCAP 95 101*   Review of Systems:   Unable to obtained. Patient intubated.   Past Medical History  She,  has a past medical history of Acute MI ([place]), CAD (coronary artery disease) (2002), Chronic anticoagulation, Chronic respiratory failure ([place]) (08/27/2013), COPD (chronic obstructive pulmonary disease) ([place]), Factor 5 Leiden mutation, heterozygous ([place]), Factor V Leiden, prothrombin gene mutation ([place]) (2006), Hyperlipidemia, Hypothyroidism, Noncompliance, Pelvic fracture ([place]) (2009), Pulmonary embolism ([place]) (2006), and Tobacco abuse.   Surgical History    Past Surgical History:  Procedure Laterality Date  . COLONOSCOPY  Approximately 2000   Negative screening study  . COLONOSCOPY WITH PROPOFOL N/A 05/28/2018   Procedure: COLONOSCOPY WITH PROPOFOL;  Surgeon: Rogene Houston, MD;  Location: [facility];  Service: Endoscopy;  Laterality: N/A;  . CORONARY ANGIOPLASTY  2002, 2003, 2012  . ESOPHAGOGASTRODUODENOSCOPY (EGD) WITH PROPOFOL N/A 05/26/2018   Procedure: ESOPHAGOGASTRODUODENOSCOPY (EGD) WITH PROPOFOL;  Surgeon: Rogene Houston, MD;  Location: [facility];  Service: Endoscopy;  Laterality: N/A;  . LEFT AND RIGHT HEART CATHETERIZATION WITH CORONARY ANGIOGRAM N/A 04/03/2011   Procedure: LEFT AND RIGHT HEART CATHETERIZATION WITH CORONARY ANGIOGRAM;  Surgeon: Sherren Mocha, MD;  Location: [facility] CATH LAB;  Service: Cardiovascular;  Laterality: N/A;  . POLYPECTOMY  05/28/2018   Procedure: POLYPECTOMY;  Surgeon: Rogene Houston, MD;  Location: [facility];  Service: Endoscopy;;  cold snare and biopsy forcep    Social History   reports that she has been smoking cigarettes. She started smoking about 50 years ago.  She has a 25.00 pack-year smoking history. She has never used smokeless tobacco. She reports that she does not drink alcohol or use drugs.   Family History   Her family history includes Alzheimer's disease in her mother; Emphysema in her mother and sister; Factor V Leiden deficiency in her daughter, father, and sister; Heart disease in her father; Kidney cancer in her paternal grandmother.   Allergies Allergies  Allergen Reactions  . Dilaudid [Hydromorphone Hcl] Hives and Nausea Only  . Minocycline Hcl     REACTION: Dizzy  . Prednisone     REACTION: feels like throat swelling, hallucinations  . Varenicline Tartrate     REACTION: Dizzy(chantix)   . Zocor [Simvastatin - High Dose] Other (See Comments)    myalgia    Home Medications  Prior to Admission medications   Medication Sig Start Date End Date Taking? Authorizing Provider  albuterol (PROVENTIL) (2.5 MG/3ML) 0.083% nebulizer solution USE 1 VIAL BY NEBULIZER EVERY 4 HOURS AS NEEDED FOR WHEEZING. DX: J44.9 Patient taking differently: Take 2.5 mg by nebulization every 4 (four) hours as needed for wheezing or shortness of breath.  01/24/17  Yes Magdalen Spatz, NP  arformoterol (BROVANA) 15 MCG/2ML NEBU Take 2 mLs (15 mcg total) by nebulization 2 (two) times daily. 09/07/17  Yes Juanito Doom, MD  baclofen (LIORESAL) 10 MG tablet Take 1 tablet (10 mg total) by mouth 3 (three) times daily. 01/24/18  Yes Lavina Hamman, MD  bisacodyl (DULCOLAX) 5 MG EC tablet Take 1 tablet (5 mg total) by mouth daily as needed for mild constipation. 05/29/18  Yes Shah, Pratik D, DO  bisoprolol (ZEBETA) 5 MG tablet Take 0.5 tablets (2.5 mg total) by mouth daily. 07/12/18 08/11/18 Yes Shah, Pratik D, DO  budesonide (PULMICORT) 0.5 MG/2ML nebulizer solution Take 2 mLs (0.5 mg total) by nebulization 2 (two) times daily. Dx: J43.9 01/24/17  Yes Magdalen Spatz, NP  buPROPion [facility] SR) 100 MG 12 hr  tablet Take 100 mg by mouth every morning.   Yes [provider]  citalopram (CELEXA) 10 MG tablet Take 10 mg by mouth daily.  04/29/18  Yes [provider]  enoxaparin (LOVENOX) 120 MG/0.8ML injection Inject 0.73 mLs (110 mg total) into the skin daily for 10 days. 07/11/18 07/21/18 Yes Shah, Pratik D, DO  Glycerin-Hypromellose-PEG 400 (DRY EYE RELIEF DROPS) 0.2-0.2-1 % SOLN Place 1-2 drops into both eyes 2 (two) times a day.   Yes [provider]  HYDROcodone-acetaminophen (NORCO/VICODIN) 5-325 MG tablet Take 1 tablet by mouth every 6 (six) hours as needed (BREAKTHROUGH PAIN). 02/19/18  Yes Johnson, Clanford L, MD  ipratropium-albuterol (DUONEB) 0.5-2.5 (3) MG/3ML SOLN Take 3 mLs by nebulization 4 (four) times daily. 03/20/17  Yes Magdalen Spatz, NP  levothyroxine (SYNTHROID, LEVOTHROID) 125 MCG tablet Take 1 tablet (125 mcg total) by mouth daily. 08/16/17  Yes Nafziger, Tommi Rumps, NP  nitroGLYCERIN (NITROSTAT) 0.4 MG SL tablet Place 1 tablet (0.4 mg total) under the tongue every 5 (five) minutes as needed for chest pain. 10/28/14  Yes Herminio Commons, MD  Omega-3 Fatty Acids (FISH OIL) 600 MG CAPS Take 1 capsule by mouth daily.    Yes [provider]  pantoprazole (PROTONIX) 40 MG tablet Take 1 tablet (40 mg total) by mouth daily. Patient taking differently: Take 40 mg by mouth daily as needed (reflux/heartburn).  02/20/18  Yes Johnson, Clanford L, MD  rosuvastatin (CRESTOR) 5 MG tablet Take 1 tablet (5 mg total) by mouth daily at 6 PM. 07/11/18 08/10/18 Yes Shah, Pratik D, DO  ticagrelor (BRILINTA) 60 MG TABS tablet Take 1 tablet (60 mg total) by mouth 2 (two) times daily. 02/01/18  Yes Johnson, Clanford L, MD  traZODone (DESYREL) 50 MG tablet Take 50 mg by mouth at bedtime as needed for sleep.   Yes [provider]  warfarin (COUMADIN) 1 MG tablet Take 5-6 mg by mouth See admin instructions. On Sunday, Tuesday, Thursday, and Saturday, patient takes 94m; on all other days, patient takes 547m  Yes [provider]   ferrous sulfate 325 (65 FE) MG tablet Take 1 tablet (325 mg total) by mouth daily with breakfast. Patient not taking: Reported on 07/19/2018 07/11/18 08/10/18  ShHeath Lark, DO  Respiratory Therapy Supplies (FLUTTER) DEVI Use as directed 05/14/15   NeJavier GlazierMD        Independent CC time 32 minutes  RoBaltazar ApoMD, PhD 07/27/2018, 9:37 AM [facility] [phone] if no answer [phone]

## 2018-07-27 NOTE — Progress Notes (Signed)
eLink Physician-Brief Progress Note Patient Name: Lamar Naef DOB: Jun 24, 1950 MRN: [id number]   Date of Service  07/27/2018  HPI/Events of Note  Agitation - Request for bilateral soft wrist restraints and increased in Precedex IV infusion rate. HR = 63, therefore, can't increase Precedex IV infusion. Already on Fentanyl IV PRN.   eICU Interventions  Will order: 1. Bilateral soft wrist restraints.  2. Versed 1 mg Q 2 hours PRN agitation.      Intervention Category Major Interventions: Delirium, psychosis, severe agitation - evaluation and management  Belvin Gauss [name] 07/27/2018, 8:34 PM

## 2018-07-27 NOTE — Evaluation (Signed)
Physical Therapy Evaluation Patient Details Name: [name] MRN: [id number] DOB: Aug 25, 1950 Today's Date: 07/27/2018   History of Present Illness  68 year old with Factor V Leiden on warfarin presented with hematemesis. Coded in [facility] ED for 12 minutes.  PMH: COPD, MI, HTN,anteriosclerotic cardiovascular disease, chronic respiratory failure, tobacco use, and PE  Clinical Impression  Pt admitted with above. Pt intubated and has R groin art line limiting mobility advancement. Pt very frustrated and agitated being intubated at this time. Pt moving all 4 extremities and initiating EOB mobility. Pt following commands consistently. Suspect once pt is extubated and no longer has a groin line pt will progress quickly from mobility stand point. Acute PT to cont to follow to advance mobility as able.    Follow Up Recommendations Home health PT;Supervision/Assistance - 24 hour(pending progression once off vent)    Equipment Recommendations  None recommended by PT    Recommendations for Other Services       Precautions / Restrictions Precautions Precautions: Fall Precaution Comments: pt agitated, intubated Restrictions Weight Bearing Restrictions: No      Mobility  Bed Mobility Overal bed mobility: Needs Assistance Bed Mobility: Supine to Sit     Supine to sit: Min assist     General bed mobility comments: pt attempted to sit up on EOB but began coughing and then PT/OT saw the art line in R groin and returned pt to supine to minimize occlusion of line  Transfers                 General transfer comment: unable to advance mobility due to R groin art line  Ambulation/Gait             General Gait Details: unable to advance due to intubated and R groin art line  Stairs            Wheelchair Mobility    Modified Rankin (Stroke Patients Only)       Balance Overall balance assessment: (unable to address due to R groin art line)                                            Pertinent Vitals/Pain Pain Assessment: No/denies pain    Home Living Family/patient expects to be discharged to:: Private residence Living Arrangements: Alone Available Help at Discharge: Neighbor;Available PRN/intermittently Type of Home: House Home Access: Stairs to enter Entrance Stairs-Rails: Right;Left;Can reach both Entrance Stairs-Number of Steps: 6 in front (rails to wide to reach both), 3 on the side (can reach both siderails) Home Layout: One level Home Equipment: Hand held shower head;Walker - 2 wheels;Cane - single point;Shower seat;Bedside commode;Walker - 4 wheels Additional Comments: pt stated lives alone in 1 story home, other info taken from recent admission    Prior Function Level of Independence: Independent with assistive device(s)         Comments: household ambulator with RW     Hand Dominance   Dominant Hand: Right    Extremity/Trunk Assessment   Upper Extremity Assessment Upper Extremity Assessment: Generalized weakness    Lower Extremity Assessment Lower Extremity Assessment: Generalized weakness       Communication   Communication: (intubated)  Cognition Arousal/Alertness: Awake/alert Behavior During Therapy: Agitated;Impulsive Overall Cognitive Status: No family/caregiver present to determine baseline cognitive functioning  General Comments: pt very irritated being intubated, calms down once pt is educated on importance of tube, pt also very agitated regarding mittens, pt frustrated, pt did follow all simple commands and write needs on paper      General Comments General comments (skin integrity, edema, etc.): [facility]    Exercises     Assessment/Plan    PT Assessment Patient needs continued PT services  PT Problem List Decreased strength;Decreased activity tolerance;Decreased balance;Decreased mobility       PT Treatment Interventions Gait  training;Stair training;Functional mobility training;Patient/family education;Therapeutic exercise;Therapeutic activities    PT Goals (Current goals can be found in the Care Plan section)  Acute Rehab PT Goals Patient Stated Goal: get extubated PT Goal Formulation: With patient Time For Goal Achievement: 08/10/18 Potential to Achieve Goals: Good    Frequency Min 3X/week   Barriers to discharge Decreased caregiver support lives alone    Co-evaluation PT/OT/SLP Co-Evaluation/Treatment: Yes Reason for Co-Treatment: Necessary to address cognition/behavior during functional activity PT goals addressed during session: Mobility/safety with mobility         AM-PAC PT "6 Clicks" Mobility  Outcome Measure Help needed turning from your back to your side while in a flat bed without using bedrails?: None Help needed moving from lying on your back to sitting on the side of a flat bed without using bedrails?: A Little Help needed moving to and from a bed to a chair (including a wheelchair)?: A Little Help needed standing up from a chair using your arms (e.g., wheelchair or bedside chair)?: A Little Help needed to walk in hospital room?: A Little Help needed climbing 3-5 steps with a railing? : A Lot 6 Click Score: 18    End of Session Equipment Utilized During Treatment: (intubated) Activity Tolerance: Patient tolerated treatment well Patient left: in bed;with call bell/phone within reach;with restraints reapplied(bilat mittens) Nurse Communication: Mobility status PT Visit Diagnosis: Difficulty in walking, not elsewhere classified (R26.2)    Time: 4827-0786 PT Time Calculation (min) (ACUTE ONLY): 25 min   Charges:   PT Evaluation $PT Eval Moderate Complexity: 1 Mod          Kittie Plater, PT, DPT Acute Rehabilitation Services Pager #: [phone] Office #: [phone]   [place] 07/27/2018, 10:08 AM

## 2018-07-27 NOTE — Plan of Care (Signed)
  Problem: Elimination: Goal: Will not experience complications related to bowel motility Outcome: Progressing   

## 2018-07-27 NOTE — Evaluation (Signed)
Occupational Therapy Evaluation Patient Details Name: [name] MRN: [id number] DOB: March 14, 1950 Today's Date: 07/27/2018    History of Present Illness 68 year old with Factor V Leiden on warfarin presented with hematemesis. Coded in [facility] ED for 12 minutes.  PMH: COPD, MI, HTN,anteriosclerotic cardiovascular disease, chronic respiratory failure, tobacco use, and PE   Clinical Impression   PTA, pt was living alone (per patient) and was independent and using RW. Pt currently requiring Max A for UB ADLs, LB ADLs, and toileting. Due to intubation and R groin art line, limited to bed level. Pt easily frustrated and agitated, moving all four extremities, and following simple commands. Pt engaging in BUE exercises and readjusting in bed to optimize comfort. Pt would benefit from further acute OT to facilitate safe dc. Pending pt progress, recommend dc to home with HHOT for further OT to optimize safety, independence with ADLs, and return to PLOF.      Follow Up Recommendations  Home health OT;Supervision/Assistance - 24 hour(Pending progress once off the vent)    Equipment Recommendations  None recommended by OT(Confirm home DME)    Recommendations for Other Services PT consult     Precautions / Restrictions Precautions Precautions: Fall Precaution Comments: pt agitated, intubated Restrictions Weight Bearing Restrictions: No      Mobility Bed Mobility Overal bed mobility: Needs Assistance Bed Mobility: Supine to Sit     Supine to sit: Min assist     General bed mobility comments: pt attempted to sit up on EOB but began coughing and then PT/OT saw the art line in R groin and returned pt to supine to minimize occlusion of line  Transfers                 General transfer comment: unable to advance mobility due to R groin art line    Balance                                           ADL either performed or assessed with clinical judgement    ADL Overall ADL's : Needs assistance/impaired Eating/Feeding: NPO   Grooming: Minimal assistance;Bed level   Upper Body Bathing: Maximal assistance;Bed level   Lower Body Bathing: Maximal assistance;Bed level   Upper Body Dressing : Maximal assistance;Bed level   Lower Body Dressing: Maximal assistance;Bed level Lower Body Dressing Details (indicate cue type and reason): Pt attempting to bring ankle to knees in bed to don socks for left leg but became distracted and frustrated easily. Max A               General ADL Comments: Attempting to engage patient in exercises as RN reporting increased agitation.      Vision Baseline Vision/History: Wears glasses       Perception     Praxis      Pertinent Vitals/Pain Pain Assessment: No/denies pain     Hand Dominance Right   Extremity/Trunk Assessment Upper Extremity Assessment Upper Extremity Assessment: Generalized weakness   Lower Extremity Assessment Lower Extremity Assessment: Generalized weakness   Cervical / Trunk Assessment Cervical / Trunk Assessment: Kyphotic   Communication Communication Communication: (intubated)   Cognition Arousal/Alertness: Awake/alert Behavior During Therapy: Agitated;Impulsive Overall Cognitive Status: No family/caregiver present to determine baseline cognitive functioning  General Comments: pt very irritated being intubated, calms down once pt is educated on importance of tube, pt also very agitated regarding mittens, pt frustrated, pt did follow all simple commands and write needs on paper   General Comments  VSS on vent    Exercises Exercises: General Upper Extremity General Exercises - Upper Extremity Shoulder Flexion: AAROM;Both;10 reps;Supine Elbow Flexion: AROM;Both;10 reps;Supine Elbow Extension: AROM;Both;10 reps;Supine Wrist Flexion: AROM;Both;10 reps;Supine Wrist Extension: AROM;Both;10 reps;Supine Digit Composite  Flexion: AROM;Both;10 reps;Supine Composite Extension: AROM;Both;10 reps;Supine   Shoulder Instructions      Home Living Family/patient expects to be discharged to:: Private residence Living Arrangements: Alone Available Help at Discharge: Neighbor;Available PRN/intermittently Type of Home: House Home Access: Stairs to enter [employer] of Steps: 6 in front (rails to wide to reach both), 3 on the side (can reach both siderails) Entrance Stairs-Rails: Right;Left;Can reach both Home Layout: One level     Bathroom Shower/Tub: Teacher, early years/pre: Standard Bathroom Accessibility: Yes   Home Equipment: Hand held shower head;Walker - 2 wheels;Cane - single point;Shower seat;Bedside commode;Walker - 4 wheels   Additional Comments: pt stated lives alone in 1 story home, other info taken from recent admission      Prior Functioning/Environment Level of Independence: Independent with assistive device(s)        Comments: household ambulator with RW        OT Problem List: Decreased strength;Decreased range of motion;Decreased activity tolerance;Impaired balance (sitting and/or standing);Decreased cognition;Decreased safety awareness;Decreased knowledge of use of DME or AE;Decreased knowledge of precautions;Cardiopulmonary status limiting activity      OT Treatment/Interventions: Self-care/ADL training;Therapeutic exercise;Energy conservation;DME and/or AE instruction;Therapeutic activities;Patient/family education    OT Goals(Current goals can be found in the care plan section) Acute Rehab OT Goals Patient Stated Goal: "I want to eat. I am starving" - pt wrote on paper OT Goal Formulation: With patient Time For Goal Achievement: 08/10/18 Potential to Achieve Goals: Good  OT Frequency: Min 2X/week   Barriers to D/C:            Co-evaluation PT/OT/SLP Co-Evaluation/Treatment: Yes Reason for Co-Treatment: For patient/therapist safety;To address  functional/ADL transfers   OT goals addressed during session: ADL's and self-care      AM-PAC OT "6 Clicks" Daily Activity     Outcome Measure Help from another person eating meals?: Total Help from another person taking care of personal grooming?: A Little Help from another person toileting, which includes using toliet, bedpan, or urinal?: A Lot Help from another person bathing (including washing, rinsing, drying)?: A Lot Help from another person to put on and taking off regular upper body clothing?: A Lot Help from another person to put on and taking off regular lower body clothing?: A Lot 6 Click Score: 12   End of Session Nurse Communication: Mobility status  Activity Tolerance: Treatment limited secondary to agitation Patient left: in bed;with call bell/phone within reach;with nursing/sitter in room  OT Visit Diagnosis: Unsteadiness on feet (R26.81);Other abnormalities of gait and mobility (R26.89);Muscle weakness (generalized) (M62.81);Other symptoms and signs involving cognitive function                Time: 4174-0814 OT Time Calculation (min): 28 min Charges:  OT General Charges $OT Visit: 1 Visit OT Evaluation $OT Eval Moderate Complexity: [place], OTR/L Acute Rehab Pager: [phone] Office: [place] 07/27/2018, 1:12 PM

## 2018-07-28 ENCOUNTER — Inpatient Hospital Stay (HOSPITAL_COMMUNITY): Payer: Medicare Other

## 2018-07-28 LAB — CBC
HCT: 23.9 % — ABNORMAL LOW (ref 36.0–46.0)
HCT: 24.3 % — ABNORMAL LOW (ref 36.0–46.0)
Hemoglobin: 7.9 g/dL — ABNORMAL LOW (ref 12.0–15.0)
Hemoglobin: 7.7 g/dL — ABNORMAL LOW (ref 12.0–15.0)
MCH: 30.4 pg (ref 26.0–34.0)
MCH: 29.8 pg (ref 26.0–34.0)
MCHC: 33.1 g/dL (ref 30.0–36.0)
MCHC: 31.7 g/dL (ref 30.0–36.0)
MCV: 91.9 fL (ref 80.0–100.0)
MCV: 94.2 fL (ref 80.0–100.0)
Platelets: 198 10*3/uL (ref 150–400)
Platelets: 184 10*3/uL (ref 150–400)
RBC: 2.6 MIL/uL — ABNORMAL LOW (ref 3.87–5.11)
RBC: 2.58 MIL/uL — ABNORMAL LOW (ref 3.87–5.11)
RDW: 14.8 % (ref 11.5–15.5)
RDW: 15.2 % (ref 11.5–15.5)
WBC: 8.6 10*3/uL (ref 4.0–10.5)
WBC: 8.6 10*3/uL (ref 4.0–10.5)
nRBC: 0 % (ref 0.0–0.2)
nRBC: 0 % (ref 0.0–0.2)

## 2018-07-28 LAB — RENAL FUNCTION PANEL
Albumin: 2.2 g/dL — ABNORMAL LOW (ref 3.5–5.0)
Anion gap: 9 (ref 5–15)
BUN: 10 mg/dL (ref 8–23)
CO2: 31 mmol/L (ref 22–32)
Calcium: 8 mg/dL — ABNORMAL LOW (ref 8.9–10.3)
Chloride: 95 mmol/L — ABNORMAL LOW (ref 98–111)
Creatinine, Ser: 1.07 mg/dL — ABNORMAL HIGH (ref 0.44–1.00)
GFR calc Af Amer: >60 mL/min (ref >60)
GFR calc non Af Amer: 53 mL/min — ABNORMAL LOW (ref >60)
Glucose, Bld: 107 mg/dL — ABNORMAL HIGH (ref 70–99)
Phosphorus: 3.3 mg/dL (ref 2.5–4.6)
Potassium: 4.3 mmol/L (ref 3.5–5.1)
Sodium: 135 mmol/L (ref 135–145)

## 2018-07-28 LAB — PROTIME-INR
INR: 1.8 — ABNORMAL HIGH (ref 0.8–1.2)
INR: 1.4 — ABNORMAL HIGH (ref 0.8–1.2)
Prothrombin Time: 20.4 seconds — ABNORMAL HIGH (ref 11.4–15.2)
Prothrombin Time: 17.4 seconds — ABNORMAL HIGH (ref 11.4–15.2)

## 2018-07-28 LAB — HEPARIN LEVEL (UNFRACTIONATED): Heparin Unfractionated: <0.1 IU/mL — ABNORMAL LOW (ref 0.30–0.70)

## 2018-07-28 LAB — MAGNESIUM: Magnesium: 1.9 mg/dL (ref 1.7–2.4)

## 2018-07-28 MED ORDER — HEPARIN (PORCINE) 25000 UT/250ML-% IV SOLN
950.0000 [IU]/h | INTRAVENOUS | Status: DC
  Administered 2018-07-28: 600 [IU]/h via INTRAVENOUS
  Administered 2018-07-29: 16:00:00 1050 [IU]/h via INTRAVENOUS
  Administered 2018-07-30: 1200 [IU]/h via INTRAVENOUS
  Administered 2018-07-31: 1150 [IU]/h via INTRAVENOUS
  Administered 2018-08-01: 1100 [IU]/h via INTRAVENOUS
  Administered 2018-08-03: 14:00:00 1200 [IU]/h via INTRAVENOUS
  Administered 2018-08-04 – 2018-08-09 (×5): 1000 [IU]/h via INTRAVENOUS
  Administered 2018-08-10: 22:00:00 950 [IU]/h via INTRAVENOUS
  Filled 2018-07-28 (×15): qty 250

## 2018-07-28 MED ORDER — MAGNESIUM SULFATE 2 GM/50ML IV SOLN
2.0000 g | Freq: Once | INTRAVENOUS | Status: AC
  Administered 2018-07-28: 2 g via INTRAVENOUS
  Filled 2018-07-28: qty 50

## 2018-07-28 NOTE — Progress Notes (Signed)
ANTICOAGULATION CONSULT NOTE - Follow Up Consult  Pharmacy Consult for Warfarin/Heparin  Indication: atrial fibrillation  Allergies  Allergen Reactions  . Dilaudid [Hydromorphone Hcl] Hives and Nausea Only  . Minocycline Hcl     REACTION: Dizzy  . Prednisone     REACTION: feels like throat swelling, hallucinations  . Varenicline Tartrate     REACTION: Dizzy(chantix)   . Zocor [Simvastatin - High Dose] Other (See Comments)    myalgia    Patient Measurements: Height: 5\' 4"  (162.6 cm) Weight: 146 lb 2.6 oz (66.3 kg) IBW/kg (Calculated) : 54.7  Vital Signs: Temp: 97.7 F (36.5 C) (07/19 1345) Temp Source: Bladder (07/19 1200) BP: 85/44 (07/19 1333) Pulse Rate: 65 (07/19 1345)  Labs: Recent Labs    07/25/18 1605  07/25/18 1825  07/25/18 1949  07/25/18 2000  07/26/18 0431 07/26/18 0438 07/26/18 1714 07/27/18 0239 07/28/18 0350  HGB  --    < >  --   --  9.0*  --   --    < >  --  8.8* 8.6* 8.7* 7.9*  HCT  --    < >  --   --  29.6*  --   --    < >  --  27.7* 26.1* 26.2* 23.9*  PLT  --   --   --    < > 192  --   --   --   --  189  --  244 198  APTT  --   --   --   --   --   --  32  --   --   --   --   --   --   LABPROT  --   --   --   --   --    < > 31.6*  --  36.9*  --   --  38.8* 20.4*  INR  --   --   --   --   --    < > 3.1*  --  3.8*  --   --  4.1* 1.8*  CREATININE  --    < > 0.86  --  1.10*  --   --   --  1.12*  --   --  1.37* 1.07*  TROPONINIHS 14  --  12  --  34*  --   --   --   --   --   --   --   --    < > = values in this interval not displayed.    Estimated Creatinine Clearance: 47.1 mL/min (A) (by C-G formula based on SCr of 1.07 mg/dL (H)).  Assessment:  68 yr old female with hx PE/DVT in setting of  Factor V Leiden on warfarin PTA which was reversed with [place] and Vitamin K on 07/17/18 d/t coagulapathy. No current plans for EGD per GI so CCM ordered anticoagulation to be restarted on 7/13.   IV heparin begun 7/15 pm, then stopped 7/16 am due to  hemoptysis and INR up to 2.8, likely due to concurrent Bactrim IV for Stenotrophomonas pneumonia. PEA arrest 7/16, INR has trended up to 4 warfarin held INR > down to 1.8 today  Will hold warfarin for now. Hgb stable overall at 8.5 Discussed with CCM will start very low dose heparin drip to balance clot vs bleed risk   PTA warfarin:  5 mg MWFSun, 6 mg TTSat.      Goal of Therapy:  HL 0.2ish  INR 2-3 Monitor platelets by anticoagulation protocol: Yes   Plan:  Holding warfarin for now - follow up restart when more stable   Daily PT/INR. Heparin drip 600 uts/hr  HL and CBC 6hr after start     [place].D. CPP, BCPS Clinical Pharmacist [phone] 07/28/2018 1:56 PM

## 2018-07-28 NOTE — Progress Notes (Signed)
ANTICOAGULATION CONSULT NOTE - Follow Up Consult  Pharmacy Consult for Warfarin/Heparin  Indication: atrial fibrillation  Allergies  Allergen Reactions  . Dilaudid [Hydromorphone Hcl] Hives and Nausea Only  . Minocycline Hcl     REACTION: Dizzy  . Prednisone     REACTION: feels like throat swelling, hallucinations  . Varenicline Tartrate     REACTION: Dizzy(chantix)   . Zocor [Simvastatin - High Dose] Other (See Comments)    myalgia    Patient Measurements: Height: 5\' 4"  (162.6 cm) Weight: 146 lb 2.6 oz (66.3 kg) IBW/kg (Calculated) : 54.7  Vital Signs: Temp: 100 F (37.8 C) (07/19 2130) Temp Source: Bladder (07/19 1200) BP: 106/46 (07/19 2130) Pulse Rate: 74 (07/19 2130)  Labs: Recent Labs    07/26/18 0431  07/27/18 0239 07/28/18 0350 07/28/18 1412 07/28/18 2020  HGB  --    < > 8.7* 7.9* 7.7*  --   HCT  --    < > 26.2* 23.9* 24.3*  --   PLT  --    < > 244 198 184  --   LABPROT 36.9*  --  38.8* 20.4*  --  17.4*  INR 3.8*  --  4.1* 1.8*  --  1.4*  HEPARINUNFRC  --   --   --   --   --  <0.10*  CREATININE 1.12*  --  1.37* 1.07*  --   --    < > = values in this interval not displayed.    Estimated Creatinine Clearance: 47.1 mL/min (A) (by C-G formula based on SCr of 1.07 mg/dL (H)).  Assessment: 68 yr old female with hx PE/DVT in setting of  Factor V Leiden on warfarin PTA which was reversed with [place] and Vitamin K on 07/17/18 d/t coagulapathy. No current plans for EGD per GI so CCM ordered anticoagulation to be restarted on 7/13.   IV heparin begun 7/15 pm, then stopped 7/16 am due to hemoptysis and INR up to 2.8, likely due to concurrent Bactrim IV for Stenotrophomonas pneumonia. PEA arrest 7/16, INR has trended up to 4 warfarin held INR > down to 1.4 today. Will hold warfarin for now. Discussed with CCM will start very low dose heparin drip to balance clot vs bleed risk.   PTA warfarin:  5 mg MWFSun, 6 mg TTSat.    Heparin level <0.1 on heparin 600 units/hr.  Hgb 7.7. Plt WNL. No reported bleeding.   Goal of Therapy:  HL 0.15 - 0.2  INR 2-3 Monitor platelets by anticoagulation protocol: Yes   Plan:  Increase heparin to 850 units/hr  Heparin level at 0500 on 7/20 Monitor heparin level, CBC, and S/S of bleeding daily  Holding warfarin for now - follow up restart when more stable  Daily PT/INR   Cristela Felt, PharmD PGY1 Pharmacy Resident [employer]: [phone]  07/28/2018 10:01 PM

## 2018-07-28 NOTE — Plan of Care (Signed)
  Problem: Clinical Measurements: Goal: Ability to maintain clinical measurements within normal limits will improve 07/28/2018 0103 by Orpah Greek, RN Outcome: Progressing 07/28/2018 0102 by Orpah Greek, RN Outcome: Progressing Goal: Respiratory complications will improve 07/28/2018 0103 by Orpah Greek, RN Outcome: Not Progressing 07/28/2018 0102 by Orpah Greek, RN Outcome: Not Progressing Goal: Cardiovascular complication will be avoided 07/28/2018 0103 by Orpah Greek, RN Outcome: Progressing 07/28/2018 0102 by Orpah Greek, RN Outcome: Progressing   Problem: Activity: Goal: Risk for activity intolerance will decrease 07/28/2018 0103 by Orpah Greek, RN Outcome: Progressing 07/28/2018 0102 by Orpah Greek, RN Outcome: Progressing   Problem: Nutrition: Goal: Adequate nutrition will be maintained 07/28/2018 0103 by Orpah Greek, RN Outcome: Progressing 07/28/2018 0102 by Orpah Greek, RN Outcome: Progressing   Problem: Coping: Goal: Level of anxiety will decrease 07/28/2018 0103 by Orpah Greek, RN Outcome: Not Progressing 07/28/2018 0102 by Orpah Greek, RN Outcome: Not Progressing   Problem: Elimination: Goal: Will not experience complications related to urinary retention 07/28/2018 0103 by Orpah Greek, RN Outcome: Progressing 07/28/2018 0102 by Orpah Greek, RN Outcome: Progressing   Problem: Pain Managment: Goal: General experience of comfort will improve 07/28/2018 0103 by Orpah Greek, RN Outcome: Not Progressing 07/28/2018 0102 by Orpah Greek, RN Outcome: Not Progressing   Problem: Safety: Goal: Ability to remain free from injury will improve 07/28/2018 0103 by Orpah Greek, RN Outcome: Progressing 07/28/2018 0102 by Orpah Greek, RN Outcome: Progressing   Problem: Skin Integrity: Goal: Risk for impaired skin integrity will  decrease 07/28/2018 0103 by Orpah Greek, RN Outcome: Progressing 07/28/2018 0102 by Orpah Greek, RN Outcome: Progressing   Problem: Respiratory: Goal: Ability to maintain a clear airway and adequate ventilation will improve 07/28/2018 0103 by Orpah Greek, RN Outcome: Progressing 07/28/2018 0102 by Orpah Greek, RN Outcome: Progressing   Problem: Role Relationship: Goal: Method of communication will improve 07/28/2018 0103 by Orpah Greek, RN Outcome: Not Progressing 07/28/2018 0102 by Orpah Greek, RN Outcome: Not Progressing

## 2018-07-28 NOTE — Plan of Care (Signed)
  Problem: Coping: Goal: Level of anxiety will decrease Outcome: Not Progressing Note: Patient very easily agitated.  RN was able to discontinue restraints however patient intermittently still reaching and pulling at ETT.  Patient able to be reoriented but appears to reaching up at ETT when awakening/potentially not understanding her actions.  RN placed soft mitts back on patient and patient very upset about having to wear the mitts.  RN provided education to patient and emotional support.     Problem: Pain Managment: Goal: General experience of comfort will improve Outcome: Not Progressing

## 2018-07-28 NOTE — Progress Notes (Signed)
[place] Progress Note Patient Name: [name] DOB: Aug 28, 1950 MRN: [id number]   Date of Service  07/28/2018  HPI/Events of Note  Agitation - Request to reorder bilateral soft wrist restraints which have expired.  eICU Interventions  Will order: 1. Bilateral soft wrist restraints for 11 hours.  2. Rounding team to assess at bedside in AM and reorder for 24 hours if appropriate.      Intervention Category Major Interventions: Delirium, psychosis, severe agitation - evaluation and management  [name] 07/28/2018, 8:40 PM

## 2018-07-28 NOTE — Progress Notes (Signed)
NAME:  [name], MRN:  [id number], DOB:  01-15-1950, LOS: 58 ADMISSION DATE:  08/02/2018, CONSULTATION DATE:  07/25/2018 REFERRING MD:  Marthenia Rolling MD, CHIEF COMPLAINT: S/p cardiac arrest    Brief History   68 yo F w/ PMHx Factor V Leiden on warfarin known to PCCM service seen earlier today. Re-consulted due to cardiac arrest  History of present illness   (History from account of other providers and EMR)  68 yr old with COPD, MI, HTN, and PE, Factor V Leiden on Warfarin (also on Brillinta) presented with large volume hematemesis on late hrs of 08/02/18. Coded in [facility] for 12 minutes PEA. Coagulopathy reversed with Vit K, [place]. Transferred to [facility] started on PPI. Received transfusions and GI consulted for EGD ( wanted pt to be off AC for 5 days). Extubated on 7/9 and transferred out to Internal Medicine service. Back to ICU 7/11 for mucus plug and left lung collapse s/p thoracentesis was re-intubated and has a bronchoscopy on 7/11/ showed the whole left mainstem bronchus was occluded by mucous plug and thick secretions which extended all the way into the lower lobe. It was suctioned out and BAL was sent which was positive for Stenotrophomonas . Repeat Bronchosopy on 7/13 prior to extubation was negative. Extubated successfully on 7/13 and downgraded to Internal Medicine.   PCCM re-engaged 7/16 for increasing oxygen demand. Clinically appears to be pulm edema. Plan for diuresis and aim to support, hopefully avoid intubation. Evening of 7/16 experienced PEA arrest. Got epi, atropine, and dopamine. Intubated and transferred to the ICU for further evaluation and management. Repeat bronchoscopy 7/17.   Past Medical History   Past Medical History:  Diagnosis Date  . Acute MI ([place])    x4, code blue x3  . CAD (coronary artery disease) 2002   Inf STEMI-2002. 2003-cutting balloon + brachytherapy for restenosis; subsequent acute stent thrombosis 06/2010 requiring 2 separate interventions (Zeta  stent, then repeat cath with thrombectomy). focal basal inf AK, nl EF; 03/2011: Patent stents, minor nonobst  residual dz, nl EF; neg stress nuclear in 2008 and stress echo in 2009  . Chronic anticoagulation    Warfarin plus ticagrelor  . Chronic respiratory failure ([place]) 08/27/2013   On 2L 02  . COPD (chronic obstructive pulmonary disease) ([place])    02 dependent  . Factor 5 Leiden mutation, heterozygous ([place])   . Factor V Leiden, prothrombin gene mutation ([place]) 2006  . Hyperlipidemia   . Hypothyroidism   . Noncompliance   . Pelvic fracture ([place]) 2009  . Pulmonary embolism ([place]) 2006   Associated with deep vein thrombosis-2006; + factor V Leiden  . Tobacco abuse    50 pack years   Significant Hospital Events   7/16 PEA arrest  7/16 intubated  Consults:  Cardiology   Procedures:  Intubation 7/16  Significant Diagnostic Tests:  Endoscopy 05/28/18 > Normal esophagus, normal stomach Bronch 7/12 > Lt lung mucus plug. Friable endobronchial lesion on right bronchus intermedius CXR 7/16> Worsening LLL opacity, possibly atelectasis however can consider PNA/aspiration. Pulmonary edema. Small right pleural effusion  CXR 07/25/2018  IMPRESSION: 1. Effusion and opacity in left base has worsened. The underlying opacity could be atelectasis. However, the findings are at least somewhat concerning for pneumonia or aspiration. Recommend clinical correlation. 2. Small right pleural effusion. 3. Mild pulmonary edema.   CXR 7/17: worsening L lung consolidation, pleural effusion   CXR 7/19: personal impression. ETT pulled back a couple of cm. LUL consolidation improved, LLL still significantly  atelectatic. ? Pleural effusion increasing.    ECHO 07/05/2018  FINDINGS Left Ventricle: The left ventricle has normal systolic function, with an ejection fraction of 60-65%. The cavity size was normal. Moderate basal septal hypertrophy. Otherwise, mild concentric LVH of remaining myocardium. Diastolic  dysfunction, grade  indeterminate. Elevated left ventricular end-diastolic pressure No evidence of left ventricular regional wall motion abnormalities.. Right Ventricle: The right ventricle has normal systolic function. The cavity was normal. There is no increase in right ventricular wall thickness. Left Atrium: Left atrial size was mildly dilated. Right Atrium: Right atrial size was normal in size. Right atrial pressure is estimated at 10 mmHg. Interatrial Septum: No atrial level shunt detected by color flow Doppler. Pericardium: Trivial pericardial effusion is present. Mitral Valve: The mitral valve is degenerative in appearance. Mild thickening of the mitral valve leaflet. Mild calcification of the mitral valve leaflet. There is mild mitral annular calcification present. Mitral valve regurgitation is mild by color  flow Doppler. Tricuspid Valve: The tricuspid valve is grossly normal. Tricuspid valve regurgitation is trivial by color flow Doppler. Aortic Valve: The aortic valve was not well visualized Mild thickening of the aortic valve. Mild calcification of the aortic valve. Aortic valve regurgitation was not visualized by color flow Doppler. Pulmonic Valve: The pulmonic valve was not well visualized. Pulmonic valve regurgitation is not visualized by color flow Doppler. Aorta: The aortic root is normal in size and structure. Venous: The inferior vena cava IVC is dilated. Unable to assess respiratory variation.  Micro Data:  Blood 7/8 >NGTD Sputum 7/11 >few stenotrophomonas  Pleural fluid 7/11>rare WBC, no organisms seen BAL 7/11>Stenotrophomonas Maltophilia >100k colonies.   Antimicrobials:  Unasyn 7/8 >> 7/11 vanc 7/11 >> 7/13 Zosyn 7/11 >>7/13 Bactrim 7/13 >   Interim history/subjective:  Oxygenation mild improvement L complete atx 7/17,  New wall motion abnormality noted on TTE 7/17, cardiology indicates no interventions can be made, at least at this time. Increased agitation  o/n, orders placed for restraints, sedation. Levophed weaning 12-->5 since yesterday  Objective   Blood pressure (!) 101/43, pulse 61, temperature 97.7 F (36.5 C), resp. rate 20, height _0  (1.626 m), weight 66.3 kg, SpO2 94 %.    Vent Mode: PRVC FiO2 (%):  [50 %-70 %] 50 % Set Rate:  [20 bmp] 20 bmp Vt Set:  [430 mL] 430 mL PEEP:  [8 cmH20] 8 cmH20 Plateau Pressure:  [20 cmH20-24 cmH20] 22 cmH20   Intake/Output Summary (Last 24 hours) at 07/28/2018 0840 Last data filed at 07/28/2018 0800 Gross per 24 hour  Intake 4842.5 ml  Output 3580 ml  Net 1262.5 ml   Filed Weights   07/26/18 0415 07/27/18 0500 07/28/18 0500  Weight: 70.4 kg 66.4 kg 66.3 kg   Examination: General: Intubated, modestly responsive; NAD HENT: AT/[place]; pupils reactive, ET tube in place Pulm: nominal breath sounds on the left, right clear CV: Regular, no murmur Abdomen: Soft, nondistended, positive bowel sounds Extremities: No significant edema Skin: No rash Neuro:CNII-XII grossly intact to confrontation. Sedated enough to follow some commands, w/d to nox. Stim.     Resolved Hospital Problem list   None  Assessment & Plan:   S/p PEA arrest  Acute on Chronic Hypoxic Hypercarbic Respiratory Failure  Severe L atelectasis / collapse - Suspect respiratory driven. HS-troponin and EKG not consistent with acute ischemic event. - push pulmonary hygiene, secretion mobilization. May require repeat FOB for clearance in next few days if ineffective. Repeat CXR qd; consider repeat bronch and/or thoracentesis if  effusion continues to accumulate -wean PEEP and FiO2 as able.  -BD's as ordered -remains on bactrim for Stenotrophomonas    CAD; new wall motion abnormality on TTE 7/17 Atrial Fibrillation  - Continuing on ASA and Brilinta  - new wall motion abnormality in setting arrest, ? primary cause decompensation or secondary.  Likely the latter.  No intervention possible per Cards at least for now. They have signed  off.   Hematemesis 2/2 UGIB  -Remains on warfarin per pharmacy, INR rising.  May require vitamin K -PPI as ordered -Follow CBC, coags -GI follow-up when stable to do so  Agitation -Initiate low-dose Precedex, versed;  continue fentanyl pushes as needed  Best practice:  Diet: NPO Pain/Anxiety/Delirium protocol (if indicated): intermittent fentanyl, add precedex VAP protocol (if indicated): yes DVT prophylaxis: on Warfarin and Brilinta INR >3; only SCDs  GI prophylaxis: PPI Glucose control: at goal on no Rx Mobility: Bedrest Code Status: FULL>>> needs a palliative consult and [place] conversation Family Communication: discussed 7/17 Disposition: ICU  Labs   CBC: Recent Labs  Lab 07/24/18 0437  07/25/18 0336  07/25/18 1949  07/25/18 2308 07/26/18 0438 07/26/18 1714 07/27/18 0239 07/28/18 0350  WBC 4.5  --  4.5  --  6.9  --   --  7.9  --  8.0 8.6  NEUTROABS 3.5  --   --   --   --   --   --   --   --   --   --   HGB 8.8*   < > 8.5*   < > 9.0*   < > 9.9* 8.8* 8.6* 8.7* 7.9*  HCT 28.4*   < > 27.5*   < > 29.6*   < > 29.0* 27.7* 26.1* 26.2* 23.9*  MCV 97.6  --  97.5  --  100.0  --   --  94.5  --  91.6 91.9  PLT 119*  --  146*  --  192  --   --  189  --  244 198   < > = values in this interval not displayed.   Basic Metabolic Panel: Recent Labs  Lab 07/22/18 0520  07/25/18 1825 07/25/18 1949 07/25/18 2143 07/25/18 2308 07/26/18 0431 07/27/18 0239 07/28/18 0350  NA 137   < > 107* 140 135 136 137 136 135  K 3.2*   < > 2.7* 3.9 3.6 3.5 3.6 4.0 4.3  CL 102   < > 69* 93*  --   --  89* 86* 95*  CO2 24   < > 27 24  --   --  38* 39* 31  GLUCOSE 75   < > 1,069* 173*  --   --  78 114* 107*  BUN 13   < > <5* <5*  --   --  7* 9 10  CREATININE 0.86   < > 0.86 1.10*  --   --  1.12* 1.37* 1.07*  CALCIUM 7.7*   < > 6.2* 7.9*  --   --  7.9* 7.8* 8.0*  MG 1.7  --   --   --   --   --  1.6* 2.1 1.9  PHOS 2.8  --   --   --   --   --  2.0* 4.0 3.3   < > = values in this interval not  displayed.   GFR: Estimated Creatinine Clearance: 47.1 mL/min (A) (by C-G formula based on SCr of 1.07 mg/dL (H)). Recent Labs  Lab 07/25/18 1949  07/26/18 0438 07/27/18 0239 07/28/18 0350  WBC 6.9 7.9 8.0 8.6   Liver Function Tests: Recent Labs  Lab 07/24/18 0437 07/25/18 1949 07/27/18 0239 07/28/18 0350  AST 17 40  --   --   ALT 23 29  --   --   ALKPHOS 93 107  --   --   BILITOT 0.5 0.6  --   --   PROT 4.7* 4.9*  --   --   ALBUMIN 2.0* 2.1* 2.3* 2.2*   No results for input(s): LIPASE, AMYLASE in the last 168 hours. No results for input(s): AMMONIA in the last 168 hours.  ABG    Component Value Date/Time   PHART 7.421 07/25/2018 2308   PCO2ART 62.7 (H) 07/25/2018 2308   PO2ART 77.0 (L) 07/25/2018 2308   HCO3 40.6 (H) 07/25/2018 2308   TCO2 42 (H) 07/25/2018 2308   ACIDBASEDEF 3.6 (H) 07/25/2018 1950   O2SAT 95.0 07/25/2018 2308    Coagulation Profile: Recent Labs  Lab 07/25/18 0336 07/25/18 2000 07/26/18 0431 07/27/18 0239 07/28/18 0350  INR 2.8* 3.1* 3.8* 4.1* 1.8*   Cardiac Enzymes: No results for input(s): CKTOTAL, CKMB, CKMBINDEX, TROPONINI in the last 168 hours.  HbA1C: Hgb A1c MFr Bld  Date/Time Value Ref Range Status  08/25/2013 08:24 PM 6.0 (H) <5.7 % Final    Comment:    (NOTE)                                                                       According to the ADA Clinical Practice Recommendations for 2011, when HbA1c is used as a screening test:  >=6.5%   Diagnostic of Diabetes Mellitus           (if abnormal result is confirmed) 5.7-6.4%   Increased risk of developing Diabetes Mellitus References:Diagnosis and Classification of Diabetes Mellitus,Diabetes Care,2011,34(Suppl 1):S62-S69 and Standards of Medical Care in         Diabetes - 2011,Diabetes SNKN,3976,73 (Suppl 1):S11-S61.  06/22/2010 07:35 PM 5.7 (H) <5.7 % Final    Comment:    (NOTE)                                                                       According to the ADA  Clinical Practice Recommendations for 2011, when HbA1c is used as a screening test:  >=6.5%   Diagnostic of Diabetes Mellitus           (if abnormal result is confirmed) 5.7-6.4%   Increased risk of developing Diabetes Mellitus References:Diagnosis and Classification of Diabetes Mellitus,Diabetes ALPF,7902,40(XBDZH 1):S62-S69 and Standards of Medical Care in         Diabetes - 2011,Diabetes GDJM,4268,34 (Suppl 1):S11-S61.   CBG: Recent Labs  Lab 07/25/18 2309 07/26/18 0830  GLUCAP 95 101*   Review of Systems:   Unable to obtained. Patient intubated.   Past Medical History  She,  has a past medical history of Acute MI ([place]), CAD (coronary artery disease) (2002), Chronic anticoagulation, Chronic respiratory  failure ([place]) (08/27/2013), COPD (chronic obstructive pulmonary disease) ([place]), Factor 5 Leiden mutation, heterozygous ([place]), Factor V Leiden, prothrombin gene mutation ([place]) (2006), Hyperlipidemia, Hypothyroidism, Noncompliance, Pelvic fracture ([place]) (2009), Pulmonary embolism ([place]) (2006), and Tobacco abuse.   Surgical History    Past Surgical History:  Procedure Laterality Date  . COLONOSCOPY  Approximately 2000   Negative screening study  . COLONOSCOPY WITH PROPOFOL N/A 05/28/2018   Procedure: COLONOSCOPY WITH PROPOFOL;  Surgeon: Rogene Houston, MD;  [facility];  Service: Endoscopy;  Laterality: N/A;  . CORONARY ANGIOPLASTY  2002, 2003, 2012  . ESOPHAGOGASTRODUODENOSCOPY (EGD) WITH PROPOFOL N/A 05/26/2018   Procedure: ESOPHAGOGASTRODUODENOSCOPY (EGD) WITH PROPOFOL;  Surgeon: Rogene Houston, MD;  Location[facility];  Service: Endoscopy;  Laterality: N/A;  . LEFT AND RIGHT HEART CATHETERIZATION WITH CORONARY ANGIOGRAM N/A 04/03/2011   Procedure: LEFT AND RIGHT HEART CATHETERIZATION WITH CORONARY ANGIOGRAM;  Surgeon: Sherren Mocha, MD;  Location: [facility] CATH LAB;  Service: Cardiovascular;  Laterality: N/A;  . POLYPECTOMY  05/28/2018   Procedure: POLYPECTOMY;   Surgeon: Rogene Houston, MD;  Location[facility];  Service: Endoscopy;;  cold snare and biopsy forcep    Social History   reports that she has been smoking cigarettes. She started smoking about 50 years ago. She has a 25.00 pack-year smoking history. She has never used smokeless tobacco. She reports that she does not drink alcohol or use drugs.   Family History   Her family history includes Alzheimer's disease in her mother; Emphysema in her mother and sister; Factor V Leiden deficiency in her daughter, father, and sister; Heart disease in her father; Kidney cancer in her paternal grandmother.   Allergies Allergies  Allergen Reactions  . Dilaudid [Hydromorphone Hcl] Hives and Nausea Only  . Minocycline Hcl     REACTION: Dizzy  . Prednisone     REACTION: feels like throat swelling, hallucinations  . Varenicline Tartrate     REACTION: Dizzy(chantix)   . Zocor [Simvastatin - High Dose] Other (See Comments)    myalgia    Home Medications  Prior to Admission medications   Medication Sig Start Date End Date Taking? Authorizing Provider  albuterol (PROVENTIL) (2.5 MG/3ML) 0.083% nebulizer solution USE 1 VIAL BY NEBULIZER EVERY 4 HOURS AS NEEDED FOR WHEEZING. DX: J44.9 Patient taking differently: Take 2.5 mg by nebulization every 4 (four) hours as needed for wheezing or shortness of breath.  01/24/17  Yes Magdalen Spatz, NP  arformoterol (BROVANA) 15 MCG/2ML NEBU Take 2 mLs (15 mcg total) by nebulization 2 (two) times daily. 09/07/17  Yes Juanito Doom, MD  baclofen (LIORESAL) 10 MG tablet Take 1 tablet (10 mg total) by mouth 3 (three) times daily. 01/24/18  Yes Lavina Hamman, MD  bisacodyl (DULCOLAX) 5 MG EC tablet Take 1 tablet (5 mg total) by mouth daily as needed for mild constipation. 05/29/18  Yes Shah, Pratik D, DO  bisoprolol (ZEBETA) 5 MG tablet Take 0.5 tablets (2.5 mg total) by mouth daily. 07/12/18 08/11/18 Yes Shah, Pratik D, DO  budesonide (PULMICORT) 0.5 MG/2ML nebulizer  solution Take 2 mLs (0.5 mg total) by nebulization 2 (two) times daily. Dx: J43.9 01/24/17  Yes Magdalen Spatz, NP  buPROPion [facility] SR) 100 MG 12 hr tablet Take 100 mg by mouth every morning.   Yes [provider]  citalopram (CELEXA) 10 MG tablet Take 10 mg by mouth daily.  04/29/18  Yes [provider]  enoxaparin (LOVENOX) 120 MG/0.8ML injection Inject  0.73 mLs (110 mg total) into the skin daily for 10 days. 07/11/18 07/21/18 Yes Shah, Pratik D, DO  Glycerin-Hypromellose-PEG 400 (DRY EYE RELIEF DROPS) 0.2-0.2-1 % SOLN Place 1-2 drops into both eyes 2 (two) times a day.   Yes [provider]  HYDROcodone-acetaminophen (NORCO/VICODIN) 5-325 MG tablet Take 1 tablet by mouth every 6 (six) hours as needed (BREAKTHROUGH PAIN). 02/19/18  Yes Johnson, Clanford L, MD  ipratropium-albuterol (DUONEB) 0.5-2.5 (3) MG/3ML SOLN Take 3 mLs by nebulization 4 (four) times daily. 03/20/17  Yes Magdalen Spatz, NP  levothyroxine (SYNTHROID, LEVOTHROID) 125 MCG tablet Take 1 tablet (125 mcg total) by mouth daily. 08/16/17  Yes Nafziger, Tommi Rumps, NP  nitroGLYCERIN (NITROSTAT) 0.4 MG SL tablet Place 1 tablet (0.4 mg total) under the tongue every 5 (five) minutes as needed for chest pain. 10/28/14  Yes Herminio Commons, MD  Omega-3 Fatty Acids (FISH OIL) 600 MG CAPS Take 1 capsule by mouth daily.    Yes [provider]  pantoprazole (PROTONIX) 40 MG tablet Take 1 tablet (40 mg total) by mouth daily. Patient taking differently: Take 40 mg by mouth daily as needed (reflux/heartburn).  02/20/18  Yes Johnson, Clanford L, MD  rosuvastatin (CRESTOR) 5 MG tablet Take 1 tablet (5 mg total) by mouth daily at 6 PM. 07/11/18 08/10/18 Yes Shah, Pratik D, DO  ticagrelor (BRILINTA) 60 MG TABS tablet Take 1 tablet (60 mg total) by mouth 2 (two) times daily. 02/01/18  Yes Johnson, Clanford L, MD  traZODone (DESYREL) 50 MG tablet Take 50 mg by mouth at bedtime as needed for sleep.   Yes [provider]   warfarin (COUMADIN) 1 MG tablet Take 5-6 mg by mouth See admin instructions. On Sunday, Tuesday, Thursday, and Saturday, patient takes 36m; on all other days, patient takes 571m  Yes [provider]  ferrous sulfate 325 (65 FE) MG tablet Take 1 tablet (325 mg total) by mouth daily with breakfast. Patient not taking: Reported on 07/19/2018 07/11/18 08/10/18  ShHeath Lark, DO  Respiratory Therapy Supplies (FLUTTER) DEVI Use as directed 05/14/15   NeJavier GlazierMD        Independent CC time 3859inutes  PaBonna GainsMD PhD 07/28/2018, 9:00 AM

## 2018-07-29 ENCOUNTER — Inpatient Hospital Stay (HOSPITAL_COMMUNITY): Payer: Medicare Other

## 2018-07-29 DIAGNOSIS — G9340 Encephalopathy, unspecified: Secondary | ICD-10-CM

## 2018-07-29 LAB — CBC
HCT: 23.7 % — ABNORMAL LOW (ref 36.0–46.0)
Hemoglobin: 7.6 g/dL — ABNORMAL LOW (ref 12.0–15.0)
MCH: 30 pg (ref 26.0–34.0)
MCHC: 32.1 g/dL (ref 30.0–36.0)
MCV: 93.7 fL (ref 80.0–100.0)
Platelets: 182 10*3/uL (ref 150–400)
RBC: 2.53 MIL/uL — ABNORMAL LOW (ref 3.87–5.11)
RDW: 15.2 % (ref 11.5–15.5)
WBC: 8.4 10*3/uL (ref 4.0–10.5)
nRBC: 0 % (ref 0.0–0.2)

## 2018-07-29 LAB — POCT I-STAT 7, (LYTES, BLD GAS, ICA,H+H)
Acid-Base Excess: 1 mmol/L (ref 0.0–2.0)
Bicarbonate: 26.7 mmol/L (ref 20.0–28.0)
Calcium, Ion: 1.21 mmol/L (ref 1.15–1.40)
HCT: 19 % — ABNORMAL LOW (ref 36.0–46.0)
Hemoglobin: 6.5 g/dL — CL (ref 12.0–15.0)
O2 Saturation: 96 %
Patient temperature: 36.9
Potassium: 4.4 mmol/L (ref 3.5–5.1)
Sodium: 135 mmol/L (ref 135–145)
TCO2: 28 mmol/L (ref 22–32)
pCO2 arterial: 46 mmHg (ref 32.0–48.0)
pH, Arterial: 7.371 (ref 7.350–7.450)
pO2, Arterial: 85 mmHg (ref 83.0–108.0)

## 2018-07-29 LAB — PROTIME-INR
INR: 1.4 — ABNORMAL HIGH (ref 0.8–1.2)
Prothrombin Time: 16.5 seconds — ABNORMAL HIGH (ref 11.4–15.2)

## 2018-07-29 LAB — HEPARIN LEVEL (UNFRACTIONATED)
Heparin Unfractionated: <0.1 IU/mL — ABNORMAL LOW (ref 0.30–0.70)
Heparin Unfractionated: 0.1 IU/mL — ABNORMAL LOW (ref 0.30–0.70)
Heparin Unfractionated: 0.14 IU/mL — ABNORMAL LOW (ref 0.30–0.70)

## 2018-07-29 LAB — RENAL FUNCTION PANEL
Albumin: 2.1 g/dL — ABNORMAL LOW (ref 3.5–5.0)
Anion gap: 9 (ref 5–15)
BUN: 10 mg/dL (ref 8–23)
CO2: 25 mmol/L (ref 22–32)
Calcium: 8 mg/dL — ABNORMAL LOW (ref 8.9–10.3)
Chloride: 101 mmol/L (ref 98–111)
Creatinine, Ser: 0.94 mg/dL (ref 0.44–1.00)
GFR calc Af Amer: >60 mL/min (ref >60)
GFR calc non Af Amer: >60 mL/min (ref >60)
Glucose, Bld: 106 mg/dL — ABNORMAL HIGH (ref 70–99)
Phosphorus: 3.3 mg/dL (ref 2.5–4.6)
Potassium: 4.7 mmol/L (ref 3.5–5.1)
Sodium: 135 mmol/L (ref 135–145)

## 2018-07-29 LAB — MAGNESIUM: Magnesium: 1.9 mg/dL (ref 1.7–2.4)

## 2018-07-29 MED ORDER — BUPROPION HCL 100 MG PO TABS
50.0000 mg | ORAL_TABLET | Freq: Two times a day (BID) | ORAL | Status: DC
  Administered 2018-07-29 – 2018-07-31 (×4): 50 mg
  Filled 2018-07-29 (×7): qty 0.5

## 2018-07-29 MED ORDER — FENTANYL CITRATE (PF) 100 MCG/2ML IJ SOLN
100.0000 ug | Freq: Once | INTRAMUSCULAR | Status: AC
  Administered 2018-07-29: 100 ug via INTRAVENOUS

## 2018-07-29 MED ORDER — ROCURONIUM BROMIDE 50 MG/5ML IV SOLN
70.0000 mg | Freq: Once | INTRAVENOUS | Status: AC
  Administered 2018-07-29: 70 mg via INTRAVENOUS
  Filled 2018-07-29: qty 7

## 2018-07-29 MED ORDER — MIDAZOLAM HCL 2 MG/2ML IJ SOLN
2.0000 mg | Freq: Once | INTRAMUSCULAR | Status: AC
  Administered 2018-07-29: 2 mg via INTRAVENOUS

## 2018-07-29 MED ORDER — ETOMIDATE 2 MG/ML IV SOLN
20.0000 mg | Freq: Once | INTRAVENOUS | Status: AC
  Administered 2018-07-29: 20 mg via INTRAVENOUS

## 2018-07-29 MED ORDER — MIDAZOLAM HCL 2 MG/2ML IJ SOLN
2.0000 mg | Freq: Once | INTRAMUSCULAR | Status: AC
  Administered 2018-07-29: 10:00:00 2 mg via INTRAVENOUS

## 2018-07-29 MED ORDER — CITALOPRAM HYDROBROMIDE 10 MG PO TABS
10.0000 mg | ORAL_TABLET | Freq: Every day | ORAL | Status: DC
  Administered 2018-07-29 – 2018-08-01 (×4): 10 mg
  Filled 2018-07-29 (×4): qty 1

## 2018-07-29 MED ORDER — ETOMIDATE 2 MG/ML IV SOLN
20.0000 mg | Freq: Once | INTRAVENOUS | Status: AC
  Administered 2018-07-29: 20 mg via INTRAVENOUS
  Filled 2018-07-29: qty 10

## 2018-07-29 MED ORDER — CHLORHEXIDINE GLUCONATE 0.12 % MT SOLN
OROMUCOSAL | Status: AC
  Administered 2018-07-29: 15 mL via OROMUCOSAL
  Filled 2018-07-29: qty 30

## 2018-07-29 NOTE — Progress Notes (Signed)
PT Cancellation Note  Patient Details Name: [name] MRN: [id number] DOB: August 02, 1950   Cancelled Treatment:    Reason Eval/Treat Not Completed: Medical issues which prohibited therapy(Pt restless, getting bronchoscopy on arrival. HOLD per nsg)   Denice Paradise 07/29/2018, 10:08 AM [facility] Pager:  [phone]  Office:  [phone]

## 2018-07-29 NOTE — Progress Notes (Signed)
CPT held due to increased pt agitation.

## 2018-07-29 NOTE — Procedures (Addendum)
Bronchoscopy Procedure Note [name] [id number] 10-11-1950  Procedure: Bronchoscopy Indications: Diagnostic evaluation of the airways and Remove secretions  Procedure Details Consent: Unable to obtain consent because of emergent medical necessity. Time Out: Verified patient identification, verified procedure, site/side was marked, verified correct patient position, special equipment/implants available, medications/allergies/relevent history reviewed, required imaging and test results available.  Performed  In preparation for procedure, patient was given 100% FiO2 and bronchoscope lubricated. Sedation: Versed, fentanyl and etomidate  Airway entered and the following bronchi were examined: RUL, RML, RLL, LUL, LLL and Bronchi.   Procedures performed: Suction Bronchoscope removed, Patient placed back on 100% FiO2 at conclusion of procedure.    Evaluation Hemodynamic Status: BP stable throughout; O2 sats: stable throughout Patient's Current Condition: stable Specimens:  None Complications: No apparent complications Patient did tolerate procedure well.  [name] Homes, MD  IM[facility] PGY 3  I was present and performed the majority of the procedure.  Left main stem obstructed with a clot that was removed after multiple attempts and was sent to the lab.  Rush Farmer, M.D. [facility] Pulmonary/Critical Care Medicine. Pager: [phone]. After hours pager: [phone].

## 2018-07-29 NOTE — Progress Notes (Signed)
eLink Physician-Brief Progress Note Patient Name: [name] DOB: 10/20/1950 MRN: [id number]   Date of Service  07/29/2018  HPI/Events of Note  Agitation - Rounding team/Nursing have allowed restraint orders to expire.  eICU Interventions  Will order: 1. Will renew soft bilateral wrist restraint orders for 12 hours.  2. Please have rounding team reassess the patient in the AM for renewal for 24 hours.      Intervention Category Major Interventions: Delirium, psychosis, severe agitation - evaluation and management  [name] [name] 07/29/2018, 7:39 PM

## 2018-07-29 NOTE — Progress Notes (Signed)
Pt bronched at bedside by Dr. Nathanial Rancher and Dr. Tarri Abernethy. Large clot removed and sent down to lab. During procedure ETT removed and 8.5ETT placed. Pt tolerated procedure well. ETT resecured at 22.5 cm at the lip, placement verified with bronchoscope. RT will continue to monitor. CXR pending.

## 2018-07-29 NOTE — Progress Notes (Signed)
ANTICOAGULATION CONSULT NOTE - Follow Up Consult  Pharmacy Consult for Heparin  Indication: atrial fibrillation  Allergies  Allergen Reactions  . Dilaudid [Hydromorphone Hcl] Hives and Nausea Only  . Minocycline Hcl     REACTION: Dizzy  . Prednisone     REACTION: feels like throat swelling, hallucinations  . Varenicline Tartrate     REACTION: Dizzy(chantix)   . Zocor [Simvastatin - High Dose] Other (See Comments)    myalgia    Patient Measurements: Height: 5\' 4"  (162.6 cm) Weight: 138 lb 0.1 oz (62.6 kg) IBW/kg (Calculated) : 54.7  Vital Signs: Temp: 97.7 F (36.5 C) (07/20 2130) Temp Source: Core (07/20 1516) BP: 123/52 (07/20 2130) Pulse Rate: 64 (07/20 2130)  Labs: Recent Labs    07/27/18 0239 07/28/18 0350 07/28/18 1412  07/28/18 2020 07/29/18 0459 07/29/18 0807 07/29/18 1239 07/29/18 2049  HGB 8.7* 7.9* 7.7*  --   --  7.6* 6.5*  --   --   HCT 26.2* 23.9* 24.3*  --   --  23.7* 19.0*  --   --   PLT 244 198 184  --   --  182  --   --   --   LABPROT 38.8* 20.4*  --   --  17.4* 16.5*  --   --   --   INR 4.1* 1.8*  --   --  1.4* 1.4*  --   --   --   HEPARINUNFRC  --   --   --    < > <0.10* <0.10*  --  0.10* 0.14*  CREATININE 1.37* 1.07*  --   --   --  0.94  --   --   --    < > = values in this interval not displayed.    Estimated Creatinine Clearance: 49.5 mL/min (by C-G formula based on SCr of 0.94 mg/dL).  Assessment: 68 yr old female with hx PE/DVT in setting of  Factor V Leiden on warfarin PTA which was reversed with [place] and Vitamin K on 07/17/18 d/t coagulapathy. No current plans for EGD per GI so CCM ordered anticoagulation to be restarted on 7/13.   IV heparin begun 7/15 pm, then stopped 7/16 am due to hemoptysis and INR up to 2.8, likely due to concurrent Bactrim IV for Stenotrophomonas pneumonia. PEA arrest 7/16, INR has trended up to 4 warfarin held INR > down to 1.4 today. Will hold warfarin for now. Discussed with CCM will start very low dose  heparin drip to balance clot vs bleed risk.   Heparin level is slightly low (0.14) this PM on 1050 units/hr for lower goal of 0.15 to 2. S/p bronch with clot removed from left mainstem. Will make slight heparin rate adjustment. Warfarin remains on hold.   Goal of Therapy:  HL 0.15 - 0.2  INR 2-3 Monitor platelets by anticoagulation protocol: Yes   Plan:  Increase heparin slightly to 1100 units/hr  Next level with AM labs Monitor heparin level, CBC, and S/S of bleeding daily   Sloan Leiter, PharmD, BCPS, BCCCP Clinical Pharmacist Please refer to [facility] for [place] numbers 07/29/2018 9:47 PM

## 2018-07-29 NOTE — Progress Notes (Signed)
ANTICOAGULATION CONSULT NOTE - Follow Up Consult  Pharmacy Consult for Warfarin/Heparin  Indication: atrial fibrillation  Allergies  Allergen Reactions  . Dilaudid [Hydromorphone Hcl] Hives and Nausea Only  . Minocycline Hcl     REACTION: Dizzy  . Prednisone     REACTION: feels like throat swelling, hallucinations  . Varenicline Tartrate     REACTION: Dizzy(chantix)   . Zocor [Simvastatin - High Dose] Other (See Comments)    myalgia    Patient Measurements: Height: 5\' 4"  (162.6 cm) Weight: 138 lb 0.1 oz (62.6 kg) IBW/kg (Calculated) : 54.7  Vital Signs: Temp: 97.9 F (36.6 C) (07/20 0645) BP: 112/57 (07/20 0630) Pulse Rate: 65 (07/20 0645)  Labs: Recent Labs    07/27/18 0239 07/28/18 0350 07/28/18 1412 07/28/18 2020 07/29/18 0459  HGB 8.7* 7.9* 7.7*  --  7.6*  HCT 26.2* 23.9* 24.3*  --  23.7*  PLT 244 198 184  --  182  LABPROT 38.8* 20.4*  --  17.4* 16.5*  INR 4.1* 1.8*  --  1.4* 1.4*  HEPARINUNFRC  --   --   --  <0.10* <0.10*  CREATININE 1.37* 1.07*  --   --  0.94    Estimated Creatinine Clearance: 49.5 mL/min (by C-G formula based on SCr of 0.94 mg/dL).  Assessment: 68 yr old female with hx PE/DVT in setting of  Factor V Leiden on warfarin PTA which was reversed with [place] and Vitamin K on 07/17/18 d/t coagulapathy. No current plans for EGD per GI so CCM ordered anticoagulation to be restarted on 7/13.   IV heparin begun 7/15 pm, then stopped 7/16 am due to hemoptysis and INR up to 2.8, likely due to concurrent Bactrim IV for Stenotrophomonas pneumonia. PEA arrest 7/16, INR has trended up to 4 warfarin held INR > down to 1.4 today. Will hold warfarin for now. Discussed with CCM will start very low dose heparin drip to balance clot vs bleed risk.   Heparin level at low end of detectable (0.1) this afternoon on 950 units/hr. S/p bronch with clot removed from left mainstem. Will make slight heparin rate adjustment.   PTA warfarin:  5 mg MWFSun, 6 mg TTSat.     Goal of Therapy:  HL 0.15 - 0.2  INR 2-3 Monitor platelets by anticoagulation protocol: Yes   Plan:  Increase heparin to 1050 units/hr  Re-check heparin level in 6-8 hours Monitor heparin level, CBC, and S/S of bleeding daily  Holding warfarin for now - follow up restart when more stable  Daily PT/INR  Erin Hearing PharmD., BCPS Clinical Pharmacist 07/29/2018 8:11 AM

## 2018-07-29 NOTE — Progress Notes (Signed)
OT Cancellation Note  Patient Details Name: Celisse Ciulla MRN: [id number] DOB: 15-Oct-1950   Cancelled Treatment:    Reason Eval/Treat Not Completed: Medical issues which prohibited therapy (Pt restless, getting bronchoscopy on arrival. HOLD per nsg).   Delight Stare, OT Acute Rehabilitation Services Pager [phone] Office [phone]   Delight Stare 07/29/2018, 10:08 AM

## 2018-07-29 NOTE — Progress Notes (Signed)
ANTICOAGULATION CONSULT NOTE - Follow Up Consult  Pharmacy Consult for Warfarin/Heparin  Indication: atrial fibrillation  Allergies  Allergen Reactions  . Dilaudid [Hydromorphone Hcl] Hives and Nausea Only  . Minocycline Hcl     REACTION: Dizzy  . Prednisone     REACTION: feels like throat swelling, hallucinations  . Varenicline Tartrate     REACTION: Dizzy(chantix)   . Zocor [Simvastatin - High Dose] Other (See Comments)    myalgia    Patient Measurements: Height: 5\' 4"  (162.6 cm) Weight: 138 lb 0.1 oz (62.6 kg) IBW/kg (Calculated) : 54.7  Vital Signs: Temp: 97 F (36.1 C) (07/20 0545) BP: 100/51 (07/20 0530) Pulse Rate: 68 (07/20 0545)  Labs: Recent Labs    07/27/18 0239 07/28/18 0350 07/28/18 1412 07/28/18 2020 07/29/18 0459  HGB 8.7* 7.9* 7.7*  --  7.6*  HCT 26.2* 23.9* 24.3*  --  23.7*  PLT 244 198 184  --  182  LABPROT 38.8* 20.4*  --  17.4* 16.5*  INR 4.1* 1.8*  --  1.4* 1.4*  HEPARINUNFRC  --   --   --  <0.10* <0.10*  CREATININE 1.37* 1.07*  --   --  0.94    Estimated Creatinine Clearance: 49.5 mL/min (by C-G formula based on SCr of 0.94 mg/dL).  Assessment: 68 yr old female with hx PE/DVT in setting of  Factor V Leiden on warfarin PTA which was reversed with [place] and Vitamin K on 07/17/18 d/t coagulapathy. No current plans for EGD per GI so CCM ordered anticoagulation to be restarted on 7/13.   IV heparin begun 7/15 pm, then stopped 7/16 am due to hemoptysis and INR up to 2.8, likely due to concurrent Bactrim IV for Stenotrophomonas pneumonia. PEA arrest 7/16, INR has trended up to 4 warfarin held INR > down to 1.4 today. Will hold warfarin for now. Discussed with CCM will start very low dose heparin drip to balance clot vs bleed risk.   PTA warfarin:  5 mg MWFSun, 6 mg TTSat.    7/20 AM update: heparin level undetectable, Hgb low but stable, no issues per RN  Goal of Therapy:  HL 0.15 - 0.2  INR 2-3 Monitor platelets by anticoagulation  protocol: Yes   Plan:  Increase heparin to 950 units/hr  Re-check heparin level in 6-8 hours Monitor heparin level, CBC, and S/S of bleeding daily  Holding warfarin for now - follow up restart when more stable  Daily PT/INR  Narda Bonds, PharmD, BCPS Clinical Pharmacist Phone: [phone]

## 2018-07-29 NOTE — Procedures (Addendum)
Intubation Procedure Note [name]  [id number]  04-29-1950    Procedure: Intubation Indications: ETT needed to be changed   Procedure Details Consent: Unable to obtain consent because of altered level of consciousness. Time Out: Performed   Maximum clean technique was used including gloves, hand hygiene and mask  Laryngoscopy equipment used: MAC 3 Glidescope   Medications: Fentanyl 100 mcg, Versed 4 mg, Etomidate 40 mg, Rocuronium 70 mg   Grade 1 airway view   Evaluation Hemodynamic Status: BP stable throughout, stable throughout Patient's Current Condition: stable Patient did tolerate procedure well Chest X-ray ordered to verify placement.  pending  [name], MD IMTS PGY3  07/29/2018 10:26 AM  Attending Note:  I was present and supervised the entire procedure and assisted  Rush Farmer, M.D. [facility] Pulmonary/Critical Care Medicine. Pager: [phone]. After hours pager: [phone].

## 2018-07-29 NOTE — Progress Notes (Addendum)
NAME:  [name], MRN:  [id number], DOB:  [birth year], LOS: 12 ADMISSION DATE:  08-14-2018, CONSULTATION DATE:  07/25/2018 REFERRING MD:  Marthenia Rolling MD, CHIEF COMPLAINT: S/p cardiac arrest    Brief History   68 yo F w/ PMHx Factor V Leiden on warfarin known to PCCM service seen earlier today. Re-consulted due to cardiac arrest  History of present illness   (History from account of other providers and EMR)  68 yr old with COPD, MI, HTN, and PE, Factor V Leiden on Warfarin (also on Brillinta) presented with large volume hematemesis on late hrs of Aug 14, 2018. Coded in [facility] ED for 12 minutes PEA. Coagulopathy reversed with Vit K, [place]. Transferred to [facility] started on PPI. Received transfusions and GI consulted for EGD ( wanted pt to be off AC for 5 days). Extubated on 7/9 and transferred out to Internal Medicine service. Back to ICU 7/11 for mucus plug and left lung collapse s/p thoracentesis was re-intubated and has a bronchoscopy on 7/11/ showed the whole left mainstem bronchus was occluded by mucous plug and thick secretions which extended all the way into the lower lobe. It was suctioned out and BAL was sent which was positive for Stenotrophomonas . Repeat Bronchosopy on 7/13 prior to extubation was negative. Extubated successfully on 7/13 and downgraded to Internal Medicine.   PCCM re-engaged 7/16 for increasing oxygen demand. Clinically appears to be pulm edema. Plan for diuresis and aim to support, hopefully avoid intubation. Evening of 7/16 experienced PEA arrest. Got epi, atropine, and dopamine. Intubated and transferred to the ICU for further evaluation and management. Repeat bronchoscopy 7/17.   Past Medical History   Past Medical History:  Diagnosis Date  . Acute MI ([place])    x4, code blue x3  . CAD (coronary artery disease) 2002   Inf STEMI-2002. 2003-cutting balloon + brachytherapy for restenosis; subsequent acute stent thrombosis 06/2010 requiring 2 separate interventions (Zeta  stent, then repeat cath with thrombectomy). focal basal inf AK, nl EF; 03/2011: Patent stents, minor nonobst  residual dz, nl EF; neg stress nuclear in 2008 and stress echo in 2009  . Chronic anticoagulation    Warfarin plus ticagrelor  . Chronic respiratory failure ([place]) 08/27/2013   On 2L 02  . COPD (chronic obstructive pulmonary disease) ([name])    02 dependent  . Factor 5 Leiden mutation, heterozygous ([place])   . Factor V Leiden, prothrombin gene mutation ([place]) 2006  . Hyperlipidemia   . Hypothyroidism   . Noncompliance   . Pelvic fracture ([place]) 2009  . Pulmonary embolism ([place]) 2006   Associated with deep vein thrombosis-2006; + factor V Leiden  . Tobacco abuse    50 pack years   Significant Hospital Events   7/16 PEA arrest  7/16 intubated  Consults:  Cardiology   Procedures:  Intubation 7/16  Significant Diagnostic Tests:  Endoscopy 05/28/18 > Normal esophagus, normal stomach Bronch 7/12 > Lt lung mucus plug. Friable endobronchial lesion on right bronchus intermedius CXR 7/16> Worsening LLL opacity, possibly atelectasis however can consider PNA/aspiration. Pulmonary edema. Small right pleural effusion  CXR 07/25/2018  IMPRESSION: 1. Effusion and opacity in left base has worsened. The underlying opacity could be atelectasis. However, the findings are at least somewhat concerning for pneumonia or aspiration. Recommend clinical correlation. 2. Small right pleural effusion. 3. Mild pulmonary edema.  CXR 7/17: worsening L lung consolidation, pleural effusion   CXR 7/19: personal impression. ETT pulled back a couple of cm. LUL consolidation improved, LLL still significantly atelectatic. ?  Pleural effusion increasing.   ECHO 07/05/2018  FINDINGS Left Ventricle: The left ventricle has normal systolic function, with an ejection fraction of 60-65%. The cavity size was normal. Moderate basal septal hypertrophy. Otherwise, mild concentric LVH of remaining myocardium. Diastolic  dysfunction, grade  indeterminate. Elevated left ventricular end-diastolic pressure No evidence of left ventricular regional wall motion abnormalities.. Right Ventricle: The right ventricle has normal systolic function. The cavity was normal. There is no increase in right ventricular wall thickness. Left Atrium: Left atrial size was mildly dilated. Right Atrium: Right atrial size was normal in size. Right atrial pressure is estimated at 10 mmHg. Interatrial Septum: No atrial level shunt detected by color flow Doppler. Pericardium: Trivial pericardial effusion is present. Mitral Valve: The mitral valve is degenerative in appearance. Mild thickening of the mitral valve leaflet. Mild calcification of the mitral valve leaflet. There is mild mitral annular calcification present. Mitral valve regurgitation is mild by color  flow Doppler. Tricuspid Valve: The tricuspid valve is grossly normal. Tricuspid valve regurgitation is trivial by color flow Doppler. Aortic Valve: The aortic valve was not well visualized Mild thickening of the aortic valve. Mild calcification of the aortic valve. Aortic valve regurgitation was not visualized by color flow Doppler. Pulmonic Valve: The pulmonic valve was not well visualized. Pulmonic valve regurgitation is not visualized by color flow Doppler. Aorta: The aortic root is normal in size and structure. Venous: The inferior vena cava IVC is dilated. Unable to assess respiratory variation.  TTE 07/26/18   1. Severe hypokinesis of the left ventricular, basal-mid inferior wall, inferolateral wall and anterolateral wall.  2. The left ventricle has moderately reduced systolic function, with an ejection fraction of 35-40%. The cavity size was normal. Left ventricular diastolic function could not be evaluated.  3. Left atrial size was mildly dilated.  4. There is moderate mitral annular calcification present. Mitral valve regurgitation is moderate by color flow Doppler. The MR jet  is centrally-directed.  5. The aortic root is normal in size and structure.  6. When compared to the prior study: 07/05/2018, there is an extensive new wall motion abnormality involving the left circumflex artery and possibly also the right coronary artery distribution; there is a merked reduction in left ventricular function  and worsened mitral insufficiency.     Diastolic function was not assessed.  Micro Data:  Blood 7/8 >NGTD Sputum 7/11 >few stenotrophomonas  Pleural fluid 7/11>rare WBC, no organisms seen BAL 7/11>Stenotrophomonas Maltophilia >100k colonies.   Antimicrobials:  Unasyn 7/8 >> 7/11 vanc 7/11 >> 7/13 Zosyn 7/11 >>7/13 Bactrim 7/13 >   Interim history/subjective:  Agitated overnight.  Wants the rube out this AM.   Objective   Blood pressure (!) 112/57, pulse 65, temperature 97.9 F (36.6 C), resp. rate 15, height _0  (1.626 m), weight 62.6 kg, SpO2 97 %.    Vent Mode: PRVC FiO2 (%):  [50 %] 50 % Set Rate:  [20 bmp] 20 bmp Vt Set:  [430 mL] 430 mL PEEP:  [8 cmH20] 8 cmH20 Plateau Pressure:  [22 cmH20] 22 cmH20   Intake/Output Summary (Last 24 hours) at 07/29/2018 0720 Last data filed at 07/29/2018 0700 Gross per 24 hour  Intake 3320.13 ml  Output 3820 ml  Net -499.87 ml   Filed Weights   07/27/18 0500 07/28/18 0500 07/29/18 0455  Weight: 66.4 kg 66.3 kg 62.6 kg   Examination: General: Intubated, modestly responsive; NAD HENT: AT/[place]; pupils reactive, ET tube in place Pulm: Rhonchi on the right with crackles  of the left upper and absent breath sounds at the left base.  CV: Regular, no murmur Abdomen: Soft, nondistended, positive bowel sounds Extremities: No significant edema Skin: No rash Neuro:CNII-XII grossly intact to confrontation. Sedated enough to follow some commands, w/d to nox. Stim.    Resolved Hospital Problem list   None  Assessment & Plan:   S/p PEA arrest  Acute on Chronic Hypoxic Hypercarbic Respiratory Failure  Severe L  atelectasis / collapse - Suspect respiratory driven. HS-troponin and EKG not consistent with acute ischemic event. - push pulmonary hygiene, secretion mobilization. May require repeat FOB for clearance in next few days if ineffective. Repeat CXR qd; consider repeat bronch and/or thoracentesis if effusion continues to accumulate -Wean PEEP and FiO2 as able.   -BD's as ordered -remains on bactrim for Stenotrophomonas   CAD; new wall motion abnormality on TTE 7/17 Atrial Fibrillation  - Continuing on ASA and Brilinta  - new wall motion abnormality in setting arrest, ? primary cause decompensation or secondary.  Likely the latter. No intervention possible per Cards at least for now. They have signed off.   Hematemesis 2/2 UGIB  -Remains on warfarin per pharmacy, INR rising.  May require vitamin K -PPI as ordered -Follow CBC, coags -GI follow-up when stable to do so  Agitation -Low dose Precedex, versed;  continue fentanyl pushes as needed  Best practice:  Diet: NPO Pain/Anxiety/Delirium protocol (if indicated): intermittent fentanyl, add precedex VAP protocol (if indicated): yes DVT prophylaxis: on Warfarin and Brilinta INR >3; only SCDs  GI prophylaxis: PPI Glucose control: at goal on no Rx Mobility: Bedrest Code Status: FULL>>> needs a palliative consult and [place] conversation Family Communication: discussed 7/17 Disposition: ICU  Labs   CBC: Recent Labs  Lab 07/24/18 0437  07/26/18 0438 07/26/18 1714 07/27/18 0239 07/28/18 0350 07/28/18 1412 07/29/18 0459  WBC 4.5   < > 7.9  --  8.0 8.6 8.6 8.4  NEUTROABS 3.5  --   --   --   --   --   --   --   HGB 8.8*   < > 8.8* 8.6* 8.7* 7.9* 7.7* 7.6*  HCT 28.4*   < > 27.7* 26.1* 26.2* 23.9* 24.3* 23.7*  MCV 97.6   < > 94.5  --  91.6 91.9 94.2 93.7  PLT 119*   < > 189  --  244 198 184 182   < > = values in this interval not displayed.   Basic Metabolic Panel: Recent Labs  Lab 07/25/18 1949  07/25/18 2308 07/26/18 0431  07/27/18 0239 07/28/18 0350 07/29/18 0459  NA 140   < > 136 137 136 135 135  K 3.9   < > 3.5 3.6 4.0 4.3 4.7  CL 93*  --   --  89* 86* 95* 101  CO2 24  --   --  38* 39* 31 25  GLUCOSE 173*  --   --  78 114* 107* 106*  BUN <5*  --   --  7* _0 CREATININE 1.10*  --   --  1.12* 1.37* 1.07* 0.94  CALCIUM 7.9*  --   --  7.9* 7.8* 8.0* 8.0*  MG  --   --   --  1.6* 2.1 1.9 1.9  PHOS  --   --   --  2.0* 4.0 3.3 3.3   < > = values in this interval not displayed.   GFR: Estimated Creatinine Clearance: 49.5 mL/min (by C-G formula based on  SCr of 0.94 mg/dL). Recent Labs  Lab 07/27/18 0239 07/28/18 0350 07/28/18 1412 07/29/18 0459  WBC 8.0 8.6 8.6 8.4   Liver Function Tests: Recent Labs  Lab 07/24/18 0437 07/25/18 1949 07/27/18 0239 07/28/18 0350 07/29/18 0459  AST 17 40  --   --   --   ALT 23 29  --   --   --   ALKPHOS 93 107  --   --   --   BILITOT 0.5 0.6  --   --   --   PROT 4.7* 4.9*  --   --   --   ALBUMIN 2.0* 2.1* 2.3* 2.2* 2.1*   No results for input(s): LIPASE, AMYLASE in the last 168 hours. No results for input(s): AMMONIA in the last 168 hours.  ABG    Component Value Date/Time   PHART 7.421 07/25/2018 2308   PCO2ART 62.7 (H) 07/25/2018 2308   PO2ART 77.0 (L) 07/25/2018 2308   HCO3 40.6 (H) 07/25/2018 2308   TCO2 42 (H) 07/25/2018 2308   ACIDBASEDEF 3.6 (H) 07/25/2018 1950   O2SAT 95.0 07/25/2018 2308    Coagulation Profile: Recent Labs  Lab 07/26/18 0431 07/27/18 0239 07/28/18 0350 07/28/18 2020 07/29/18 0459  INR 3.8* 4.1* 1.8* 1.4* 1.4*   Cardiac Enzymes: No results for input(s): CKTOTAL, CKMB, CKMBINDEX, TROPONINI in the last 168 hours.  HbA1C: Hgb A1c MFr Bld  Date/Time Value Ref Range Status  08/25/2013 08:24 PM 6.0 (H) <5.7 % Final    Comment:    (NOTE)                                                                       According to the ADA Clinical Practice Recommendations for 2011, when HbA1c is used as a screening test:   >=6.5%   Diagnostic of Diabetes Mellitus           (if abnormal result is confirmed) 5.7-6.4%   Increased risk of developing Diabetes Mellitus References:Diagnosis and Classification of Diabetes Mellitus,Diabetes Care,2011,34(Suppl 1):S62-S69 and Standards of Medical Care in         Diabetes - 2011,Diabetes GNFA,2130,86 (Suppl 1):S11-S61.  06/22/2010 07:35 PM 5.7 (H) <5.7 % Final    Comment:    (NOTE)                                                                       According to the ADA Clinical Practice Recommendations for 2011, when HbA1c is used as a screening test:  >=6.5%   Diagnostic of Diabetes Mellitus           (if abnormal result is confirmed) 5.7-6.4%   Increased risk of developing Diabetes Mellitus References:Diagnosis and Classification of Diabetes Mellitus,Diabetes VHQI,6962,95(MWUXL 1):S62-S69 and Standards of Medical Care in         Diabetes - 2011,Diabetes KGMW,1027,25 (Suppl 1):S11-S61.   CBG: Recent Labs  Lab 07/25/18 2309 07/26/18 0830  GLUCAP 95 101*   Review of Systems:   Unable to obtained.  Patient intubated.   Past Medical History  She,  has a past medical history of Acute MI ([place]), CAD (coronary artery disease) (2002), Chronic anticoagulation, Chronic respiratory failure ([place]) (08/27/2013), COPD (chronic obstructive pulmonary disease) ([place]), Factor 5 Leiden mutation, heterozygous ([place]), Factor V Leiden, prothrombin gene mutation ([place]) (2006), Hyperlipidemia, Hypothyroidism, Noncompliance, Pelvic fracture ([place]) (2009), Pulmonary embolism ([place]) (2006), and Tobacco abuse.   Surgical History    Past Surgical History:  Procedure Laterality Date  . COLONOSCOPY  Approximately 2000   Negative screening study  . COLONOSCOPY WITH PROPOFOL N/A 05/28/2018   Procedure: COLONOSCOPY WITH PROPOFOL;  Surgeon: Rogene Houston, MD;  Location: [facility];  Service: Endoscopy;  Laterality: N/A;  . CORONARY ANGIOPLASTY  2002, 2003, 2012  . ESOPHAGOGASTRODUODENOSCOPY  (EGD) WITH PROPOFOL N/A 05/26/2018   Procedure: ESOPHAGOGASTRODUODENOSCOPY (EGD) WITH PROPOFOL;  Surgeon: Rogene Houston, MD;  Location: [facility];  Service: Endoscopy;  Laterality: N/A;  . LEFT AND RIGHT HEART CATHETERIZATION WITH CORONARY ANGIOGRAM N/A 04/03/2011   Procedure: LEFT AND RIGHT HEART CATHETERIZATION WITH CORONARY ANGIOGRAM;  Surgeon: Sherren Mocha, MD;  Location: [facility] CATH LAB;  Service: Cardiovascular;  Laterality: N/A;  . POLYPECTOMY  05/28/2018   Procedure: POLYPECTOMY;  Surgeon: Rogene Houston, MD;  Location: [facility];  Service: Endoscopy;;  cold snare and biopsy forcep    Social History   reports that she has been smoking cigarettes. She started smoking about 50 years ago. She has a 25.00 pack-year smoking history. She has never used smokeless tobacco. She reports that she does not drink alcohol or use drugs.   Family History   Her family history includes Alzheimer's disease in her mother; Emphysema in her mother and sister; Factor V Leiden deficiency in her daughter, father, and sister; Heart disease in her father; Kidney cancer in her paternal grandmother.   Allergies Allergies  Allergen Reactions  . Dilaudid [Hydromorphone Hcl] Hives and Nausea Only  . Minocycline Hcl     REACTION: Dizzy  . Prednisone     REACTION: feels like throat swelling, hallucinations  . Varenicline Tartrate     REACTION: Dizzy(chantix)   . Zocor [Simvastatin - High Dose] Other (See Comments)    myalgia    Home Medications  Prior to Admission medications   Medication Sig Start Date End Date Taking? Authorizing Provider  albuterol (PROVENTIL) (2.5 MG/3ML) 0.083% nebulizer solution USE 1 VIAL BY NEBULIZER EVERY 4 HOURS AS NEEDED FOR WHEEZING. DX: J44.9 Patient taking differently: Take 2.5 mg by nebulization every 4 (four) hours as needed for wheezing or shortness of breath.  01/24/17  Yes Magdalen Spatz, NP  arformoterol (BROVANA) 15 MCG/2ML NEBU Take 2 mLs (15 mcg total) by  nebulization 2 (two) times daily. 09/07/17  Yes Juanito Doom, MD  baclofen (LIORESAL) 10 MG tablet Take 1 tablet (10 mg total) by mouth 3 (three) times daily. 01/24/18  Yes Lavina Hamman, MD  bisacodyl (DULCOLAX) 5 MG EC tablet Take 1 tablet (5 mg total) by mouth daily as needed for mild constipation. 05/29/18  Yes Shah, Pratik D, DO  bisoprolol (ZEBETA) 5 MG tablet Take 0.5 tablets (2.5 mg total) by mouth daily. 07/12/18 08/11/18 Yes Shah, Pratik D, DO  budesonide (PULMICORT) 0.5 MG/2ML nebulizer solution Take 2 mLs (0.5 mg total) by nebulization 2 (two) times daily. Dx: J43.9 01/24/17  Yes Magdalen Spatz, NP  buPROPion [facility] SR) 100 MG 12 hr tablet Take 100 mg by mouth every morning.   Yes [provider]  citalopram (CELEXA) 10 MG tablet Take 10 mg by mouth daily.  04/29/18  Yes [provider]  enoxaparin (LOVENOX) 120 MG/0.8ML injection Inject 0.73 mLs (110 mg total) into the skin daily for 10 days. 07/11/18 07/21/18 Yes Shah, Pratik D, DO  Glycerin-Hypromellose-PEG 400 (DRY EYE RELIEF DROPS) 0.2-0.2-1 % SOLN Place 1-2 drops into both eyes 2 (two) times a day.   Yes [provider]  HYDROcodone-acetaminophen (NORCO/VICODIN) 5-325 MG tablet Take 1 tablet by mouth every 6 (six) hours as needed (BREAKTHROUGH PAIN). 02/19/18  Yes Johnson, Clanford L, MD  ipratropium-albuterol (DUONEB) 0.5-2.5 (3) MG/3ML SOLN Take 3 mLs by nebulization 4 (four) times daily. 03/20/17  Yes Magdalen Spatz, NP  levothyroxine (SYNTHROID, LEVOTHROID) 125 MCG tablet Take 1 tablet (125 mcg total) by mouth daily. 08/16/17  Yes Nafziger, Tommi Rumps, NP  nitroGLYCERIN (NITROSTAT) 0.4 MG SL tablet Place 1 tablet (0.4 mg total) under the tongue every 5 (five) minutes as needed for chest pain. 10/28/14  Yes Herminio Commons, MD  Omega-3 Fatty Acids (FISH OIL) 600 MG CAPS Take 1 capsule by mouth daily.    Yes [provider]  pantoprazole (PROTONIX) 40 MG tablet Take 1 tablet (40 mg total) by mouth  daily. Patient taking differently: Take 40 mg by mouth daily as needed (reflux/heartburn).  02/20/18  Yes Johnson, Clanford L, MD  rosuvastatin (CRESTOR) 5 MG tablet Take 1 tablet (5 mg total) by mouth daily at 6 PM. 07/11/18 08/10/18 Yes Shah, Pratik D, DO  ticagrelor (BRILINTA) 60 MG TABS tablet Take 1 tablet (60 mg total) by mouth 2 (two) times daily. 02/01/18  Yes Johnson, Clanford L, MD  traZODone (DESYREL) 50 MG tablet Take 50 mg by mouth at bedtime as needed for sleep.   Yes [provider]  warfarin (COUMADIN) 1 MG tablet Take 5-6 mg by mouth See admin instructions. On Sunday, Tuesday, Thursday, and Saturday, patient takes 59m; on all other days, patient takes 569m  Yes [provider]  ferrous sulfate 325 (65 FE) MG tablet Take 1 tablet (325 mg total) by mouth daily with breakfast. Patient not taking: Reported on 07/19/2018 07/11/18 08/10/18  ShHeath Lark, DO  Respiratory Therapy Supplies (FLUTTER) DEVI Use as directed 05/14/15   NeJavier GlazierMD        JuIna HomesMD IMTS PGY 3  Pager: [phone] Note:  [age]ear old female s/p cardiac arrest who was reintubated after an episode of hemoptysis and left mainstem occlusion.  No events overnight, continues to require high O2.  On exam, no BS on the left.  I reviewed CXR myself, left cut off sign noted.  Discussed with PCCM-NP.  Performed bronchoscopy with clot removal of the left mainstem and was sent down to the lab.  Maintain fluid even at this point.  Transfuse 1 unit pRBC.  The CXR post bronch the left mainstem is now re-opened.  Will attempt weaning once patient is more awake from the sedation given for reintubation and bronchoscopy.  The patient is critically ill with multiple organ systems failure and requires high complexity decision making for assessment and support, frequent evaluation and titration of therapies, application of advanced monitoring technologies and extensive interpretation of multiple  databases.   Critical Care Time devoted to patient care services described in this note is  45  Minutes. This time reflects time of care of this signee Dr WeJennet MaduroThis critical care time does not reflect procedure time, or  teaching time or supervisory time of PA/NP/Med student/Med Resident etc but could involve care discussion time.  Rush Farmer, M.D. [facility] Pulmonary/Critical Care Medicine. Pager: [phone]. After hours pager: [phone].

## 2018-07-30 ENCOUNTER — Inpatient Hospital Stay (HOSPITAL_COMMUNITY): Payer: Medicare Other

## 2018-07-30 DIAGNOSIS — R5381 Other malaise: Secondary | ICD-10-CM

## 2018-07-30 DIAGNOSIS — J988 Other specified respiratory disorders: Secondary | ICD-10-CM

## 2018-07-30 LAB — BASIC METABOLIC PANEL
Anion gap: 8 (ref 5–15)
Anion gap: 7 (ref 5–15)
BUN: 10 mg/dL (ref 8–23)
BUN: 11 mg/dL (ref 8–23)
CO2: 26 mmol/L (ref 22–32)
CO2: 25 mmol/L (ref 22–32)
Calcium: 8.2 mg/dL — ABNORMAL LOW (ref 8.9–10.3)
Calcium: 8.1 mg/dL — ABNORMAL LOW (ref 8.9–10.3)
Chloride: 100 mmol/L (ref 98–111)
Chloride: 99 mmol/L (ref 98–111)
Creatinine, Ser: 0.87 mg/dL (ref 0.44–1.00)
Creatinine, Ser: 0.89 mg/dL (ref 0.44–1.00)
GFR calc Af Amer: >60 mL/min (ref >60)
GFR calc Af Amer: >60 mL/min (ref >60)
GFR calc non Af Amer: >60 mL/min (ref >60)
GFR calc non Af Amer: >60 mL/min (ref >60)
Glucose, Bld: 100 mg/dL — ABNORMAL HIGH (ref 70–99)
Glucose, Bld: 123 mg/dL — ABNORMAL HIGH (ref 70–99)
Potassium: 5.2 mmol/L — ABNORMAL HIGH (ref 3.5–5.1)
Potassium: 4.9 mmol/L (ref 3.5–5.1)
Sodium: 134 mmol/L — ABNORMAL LOW (ref 135–145)
Sodium: 131 mmol/L — ABNORMAL LOW (ref 135–145)

## 2018-07-30 LAB — CBC
HCT: 23.6 % — ABNORMAL LOW (ref 36.0–46.0)
Hemoglobin: 7.3 g/dL — ABNORMAL LOW (ref 12.0–15.0)
MCH: 29.7 pg (ref 26.0–34.0)
MCHC: 30.9 g/dL (ref 30.0–36.0)
MCV: 95.9 fL (ref 80.0–100.0)
Platelets: 204 10*3/uL (ref 150–400)
RBC: 2.46 MIL/uL — ABNORMAL LOW (ref 3.87–5.11)
RDW: 15.2 % (ref 11.5–15.5)
WBC: 7.2 10*3/uL (ref 4.0–10.5)
nRBC: 0 % (ref 0.0–0.2)

## 2018-07-30 LAB — PROTIME-INR
INR: 1.3 — ABNORMAL HIGH (ref 0.8–1.2)
Prothrombin Time: 16 seconds — ABNORMAL HIGH (ref 11.4–15.2)

## 2018-07-30 LAB — HEPARIN LEVEL (UNFRACTIONATED)
Heparin Unfractionated: <0.1 IU/mL — ABNORMAL LOW (ref 0.30–0.70)
Heparin Unfractionated: 0.19 IU/mL — ABNORMAL LOW (ref 0.30–0.70)

## 2018-07-30 LAB — MAGNESIUM: Magnesium: 1.7 mg/dL (ref 1.7–2.4)

## 2018-07-30 MED ORDER — FENTANYL CITRATE (PF) 100 MCG/2ML IJ SOLN
25.0000 ug | INTRAMUSCULAR | Status: DC | PRN
  Administered 2018-07-30: 25 ug via INTRAVENOUS
  Administered 2018-07-30 – 2018-07-31 (×2): 50 ug via INTRAVENOUS
  Filled 2018-07-30 (×3): qty 2

## 2018-07-30 MED ORDER — ORAL CARE MOUTH RINSE
15.0000 mL | OROMUCOSAL | Status: DC
  Administered 2018-07-30 – 2018-07-31 (×6): 15 mL via OROMUCOSAL

## 2018-07-30 MED ORDER — ALBUTEROL SULFATE (2.5 MG/3ML) 0.083% IN NEBU
2.5000 mg | INHALATION_SOLUTION | Freq: Three times a day (TID) | RESPIRATORY_TRACT | Status: DC
  Administered 2018-07-30 – 2018-08-06 (×19): 2.5 mg via RESPIRATORY_TRACT
  Filled 2018-07-30 (×20): qty 3

## 2018-07-30 NOTE — Progress Notes (Addendum)
NAME:  [name], MRN:  [id number], DOB:  1950/10/09, LOS: 36 ADMISSION DATE:  08-04-18, CONSULTATION DATE:  07/25/2018 REFERRING MD:  Marthenia Rolling MD, CHIEF COMPLAINT: S/p cardiac arrest    Brief History   68 yo F w/ PMHx Factor V Leiden on warfarin known to PCCM service seen earlier today. Re-consulted due to cardiac arrest  History of present illness   (History from account of other providers and EMR)  68 yr old with COPD, MI, HTN, and PE, Factor V Leiden on Warfarin (also on Brillinta) presented with large volume hematemesis on late hrs of 04-Aug-2018. Coded in [facility] ED for 12 minutes PEA. Coagulopathy reversed with Vit K, [place]. Transferred to [facility] started on PPI. Received transfusions and GI consulted for EGD ( wanted pt to be off AC for 5 days). Extubated on 7/9 and transferred out to Internal Medicine service. Back to ICU 7/11 for mucus plug and left lung collapse s/p thoracentesis was re-intubated and has a bronchoscopy on 7/11/ showed the whole left mainstem bronchus was occluded by mucous plug and thick secretions which extended all the way into the lower lobe. It was suctioned out and BAL was sent which was positive for Stenotrophomonas . Repeat Bronchosopy on 7/13 prior to extubation was negative. Extubated successfully on 7/13 and downgraded to Internal Medicine.   PCCM re-engaged 7/16 for increasing oxygen demand. Clinically appears to be pulm edema. Plan for diuresis and aim to support, hopefully avoid intubation. Evening of 7/16 experienced PEA arrest. Got epi, atropine, and dopamine. Intubated and transferred to the ICU for further evaluation and management. Repeat bronchoscopy 7/17.   Past Medical History   Past Medical History:  Diagnosis Date  . Acute MI ([place])    x4, code blue x3  . CAD (coronary artery disease) 2002   Inf STEMI-2002. 2003-cutting balloon + brachytherapy for restenosis; subsequent acute stent thrombosis 06/2010 requiring 2 separate interventions (Zeta  stent, then repeat cath with thrombectomy). focal basal inf AK, nl EF; 03/2011: Patent stents, minor nonobst  residual dz, nl EF; neg stress nuclear in 2008 and stress echo in 2009  . Chronic anticoagulation    Warfarin plus ticagrelor  . Chronic respiratory failure ([place]) 08/27/2013   On 2L 02  . COPD (chronic obstructive pulmonary disease) ([place])    02 dependent  . Factor 5 Leiden mutation, heterozygous ([place])   . Factor V Leiden, prothrombin gene mutation ([place]) 2006  . Hyperlipidemia   . Hypothyroidism   . Noncompliance   . Pelvic fracture ([place]) 2009  . Pulmonary embolism ([place]) 2006   Associated with deep vein thrombosis-2006; + factor V Leiden  . Tobacco abuse    50 pack years   Significant Hospital Events   7/16 PEA arrest  7/16 intubated  Consults:  Cardiology   Procedures:  Intubation 7/16  Significant Diagnostic Tests:  Endoscopy 05/28/18 > Normal esophagus, normal stomach Bronch 7/12 > Lt lung mucus plug. Friable endobronchial lesion on right bronchus intermedius CXR 7/16> Worsening LLL opacity, possibly atelectasis however can consider PNA/aspiration. Pulmonary edema. Small right pleural effusion  CXR 07/25/2018  IMPRESSION: 1. Effusion and opacity in left base has worsened. The underlying opacity could be atelectasis. However, the findings are at least somewhat concerning for pneumonia or aspiration. Recommend clinical correlation. 2. Small right pleural effusion. 3. Mild pulmonary edema.  CXR 7/17: worsening L lung consolidation, pleural effusion   CXR 7/19: personal impression. ETT pulled back a couple of cm. LUL consolidation improved, LLL still significantly atelectatic. ?  Pleural effusion increasing.   ECHO 07/05/2018  FINDINGS Left Ventricle: The left ventricle has normal systolic function, with an ejection fraction of 60-65%. The cavity size was normal. Moderate basal septal hypertrophy. Otherwise, mild concentric LVH of remaining myocardium. Diastolic  dysfunction, grade  indeterminate. Elevated left ventricular end-diastolic pressure No evidence of left ventricular regional wall motion abnormalities.. Right Ventricle: The right ventricle has normal systolic function. The cavity was normal. There is no increase in right ventricular wall thickness. Left Atrium: Left atrial size was mildly dilated. Right Atrium: Right atrial size was normal in size. Right atrial pressure is estimated at 10 mmHg. Interatrial Septum: No atrial level shunt detected by color flow Doppler. Pericardium: Trivial pericardial effusion is present. Mitral Valve: The mitral valve is degenerative in appearance. Mild thickening of the mitral valve leaflet. Mild calcification of the mitral valve leaflet. There is mild mitral annular calcification present. Mitral valve regurgitation is mild by color  flow Doppler. Tricuspid Valve: The tricuspid valve is grossly normal. Tricuspid valve regurgitation is trivial by color flow Doppler. Aortic Valve: The aortic valve was not well visualized Mild thickening of the aortic valve. Mild calcification of the aortic valve. Aortic valve regurgitation was not visualized by color flow Doppler. Pulmonic Valve: The pulmonic valve was not well visualized. Pulmonic valve regurgitation is not visualized by color flow Doppler. Aorta: The aortic root is normal in size and structure. Venous: The inferior vena cava IVC is dilated. Unable to assess respiratory variation.  TTE 07/26/18   1. Severe hypokinesis of the left ventricular, basal-mid inferior wall, inferolateral wall and anterolateral wall.  2. The left ventricle has moderately reduced systolic function, with an ejection fraction of 35-40%. The cavity size was normal. Left ventricular diastolic function could not be evaluated.  3. Left atrial size was mildly dilated.  4. There is moderate mitral annular calcification present. Mitral valve regurgitation is moderate by color flow Doppler. The MR jet  is centrally-directed.  5. The aortic root is normal in size and structure.  6. When compared to the prior study: 07/05/2018, there is an extensive new wall motion abnormality involving the left circumflex artery and possibly also the right coronary artery distribution; there is a merked reduction in left ventricular function  and worsened mitral insufficiency.     Diastolic function was not assessed.  Micro Data:  Blood 7/8 >NGTD Sputum 7/11 >few stenotrophomonas  Pleural fluid 7/11>rare WBC, no organisms seen BAL 7/11>Stenotrophomonas Maltophilia >100k colonies.   Antimicrobials:  Unasyn 7/8 >> 7/11 vanc 7/11 >> 7/13 Zosyn 7/11 >>7/13 Bactrim 7/13 >   Interim history/subjective:  Agitated overnight.  No other acute events.  Objective   Blood pressure (!) 110/47, pulse 68, temperature 98.8 F (37.1 C), resp. rate (!) 21, height _0  (1.626 m), weight 62.8 kg, SpO2 96 %.    Vent Mode: PRVC FiO2 (%):  [40 %-60 %] 40 % Set Rate:  [20 bmp] 20 bmp Vt Set:  [430 mL] 430 mL PEEP:  [5 cmH20-8 cmH20] 5 cmH20 Plateau Pressure:  [14 cmH20-19 cmH20] 16 cmH20   Intake/Output Summary (Last 24 hours) at 07/30/2018 0657 Last data filed at 07/30/2018 5027 Gross per 24 hour  Intake 4426.07 ml  Output 3675 ml  Net 751.07 ml   Filed Weights   07/28/18 0500 07/29/18 0455 07/30/18 0500  Weight: 66.3 kg 62.6 kg 62.8 kg   Examination: General: Intubated, modestly responsive; NAD HENT: AT/[place]; pupils reactive, ET tube in place Pulm: Rhonchi bilaterally CV: Regular, no  murmur Abdomen: Soft, nondistended, positive bowel sounds Extremities: No significant edema Skin: No rash Neuro:CNII-XII grossly intact to confrontation. Sedated enough to follow some commands, w/d to nox. Stim.    Resolved Hospital Problem list   None  Assessment & Plan:   S/p PEA arrest  Acute on Chronic Hypoxic Hypercarbic Respiratory Failure  Severe L atelectasis / collapse - Suspect respiratory driven.  HS-troponin and EKG not consistent with acute ischemic event. - Repeat bronchoscopy on 7/20. Mucus plug removed. Significantly improved aeration of the left lung. - Tolerating PS this AM with FiO2 of 40% and 5/5. Will need to start weaning sedation.  - On bactrim for Stenotrophomonas   CAD; new wall motion abnormality on TTE 7/17 Atrial Fibrillation  - Continuing on ASA and Brilinta  - New wall motion abnormality in setting arrest, ? primary cause decompensation or secondary.  Likely the latter. No intervention possible per Cards at least for now. They have signed off.   Hematemesis 2/2 UGIB  - Remains on warfarin per pharmacy, INR rising.  May require vitamin K - PPI as ordered - Follow CBC, coags - GI follow-up when stable to do so  Agitation - Low dose Precedex, versed;  continue fentanyl pushes as needed - Will attempt to wean   Best practice:  Diet: NPO Pain/Anxiety/Delirium protocol (if indicated): intermittent fentanyl and versed, precedex VAP protocol (if indicated): yes DVT prophylaxis: on Warfarin and Brilinta INR >3; only SCDs  GI prophylaxis: PPI Glucose control: at goal on no Rx Mobility: Bedrest Code Status: FULL>>> needs a palliative consult and [place] conversation Family Communication: Unable to reach Disposition: ICU  Labs   CBC: Recent Labs  Lab 07/24/18 0437  07/27/18 0239 07/28/18 0350 07/28/18 1412 07/29/18 0459 07/29/18 0807 07/30/18 0423  WBC 4.5   < > 8.0 8.6 8.6 8.4  --  7.2  NEUTROABS 3.5  --   --   --   --   --   --   --   HGB 8.8*   < > 8.7* 7.9* 7.7* 7.6* 6.5* 7.3*  HCT 28.4*   < > 26.2* 23.9* 24.3* 23.7* 19.0* 23.6*  MCV 97.6   < > 91.6 91.9 94.2 93.7  --  95.9  PLT 119*   < > 244 198 184 182  --  204   < > = values in this interval not displayed.   Basic Metabolic Panel: Recent Labs  Lab 07/26/18 0431 07/27/18 0239 07/28/18 0350 07/29/18 0459 07/29/18 0807 07/30/18 0449  NA 137 136 135 135 135 134*  K 3.6 4.0 4.3 4.7 4.4 5.2*   CL 89* 86* 95* 101  --  100  CO2 38* 39* 31 25  --  26  GLUCOSE 78 114* 107* 106*  --  100*  BUN 7* _0 --  10  CREATININE 1.12* 1.37* 1.07* 0.94  --  0.87  CALCIUM 7.9* 7.8* 8.0* 8.0*  --  8.2*  MG 1.6* 2.1 1.9 1.9  --  1.7  PHOS 2.0* 4.0 3.3 3.3  --   --    GFR: Estimated Creatinine Clearance: 53.4 mL/min (by C-G formula based on SCr of 0.87 mg/dL). Recent Labs  Lab 07/28/18 0350 07/28/18 1412 07/29/18 0459 07/30/18 0423  WBC 8.6 8.6 8.4 7.2   Liver Function Tests: Recent Labs  Lab 07/24/18 0437 07/25/18 1949 07/27/18 0239 07/28/18 0350 07/29/18 0459  AST 17 40  --   --   --   ALT 23 29  --   --   --  ALKPHOS 93 107  --   --   --   BILITOT 0.5 0.6  --   --   --   PROT 4.7* 4.9*  --   --   --   ALBUMIN 2.0* 2.1* 2.3* 2.2* 2.1*   No results for input(s): LIPASE, AMYLASE in the last 168 hours. No results for input(s): AMMONIA in the last 168 hours.  ABG    Component Value Date/Time   PHART 7.371 07/29/2018 0807   PCO2ART 46.0 07/29/2018 0807   PO2ART 85.0 07/29/2018 0807   HCO3 26.7 07/29/2018 0807   TCO2 28 07/29/2018 0807   ACIDBASEDEF 3.6 (H) 07/25/2018 1950   O2SAT 96.0 07/29/2018 0807    Coagulation Profile: Recent Labs  Lab 07/27/18 0239 07/28/18 0350 07/28/18 2020 07/29/18 0459 07/30/18 0423  INR 4.1* 1.8* 1.4* 1.4* 1.3*   Cardiac Enzymes: No results for input(s): CKTOTAL, CKMB, CKMBINDEX, TROPONINI in the last 168 hours.  HbA1C: Hgb A1c MFr Bld  Date/Time Value Ref Range Status  08/25/2013 08:24 PM 6.0 (H) <5.7 % Final    Comment:    (NOTE)                                                                       According to the ADA Clinical Practice Recommendations for 2011, when HbA1c is used as a screening test:  >=6.5%   Diagnostic of Diabetes Mellitus           (if abnormal result is confirmed) 5.7-6.4%   Increased risk of developing Diabetes Mellitus References:Diagnosis and Classification of Diabetes Mellitus,Diabetes  Care,2011,34(Suppl 1):S62-S69 and Standards of Medical Care in         Diabetes - 2011,Diabetes IEPP,2951,88 (Suppl 1):S11-S61.  06/22/2010 07:35 PM 5.7 (H) <5.7 % Final    Comment:    (NOTE)                                                                       According to the ADA Clinical Practice Recommendations for 2011, when HbA1c is used as a screening test:  >=6.5%   Diagnostic of Diabetes Mellitus           (if abnormal result is confirmed) 5.7-6.4%   Increased risk of developing Diabetes Mellitus References:Diagnosis and Classification of Diabetes Mellitus,Diabetes CZYS,0630,16(WFUXN 1):S62-S69 and Standards of Medical Care in         Diabetes - 2011,Diabetes ATFT,7322,02 (Suppl 1):S11-S61.   CBG: Recent Labs  Lab 07/25/18 2309 07/26/18 0830  GLUCAP 95 101*   Review of Systems:   Unable to obtained. Patient intubated.   Past Medical History  She,  has a past medical history of Acute MI ([place]), CAD (coronary artery disease) (2002), Chronic anticoagulation, Chronic respiratory failure ([place]) (08/27/2013), COPD (chronic obstructive pulmonary disease) ([place]), Factor 5 Leiden mutation, heterozygous ([place]), Factor V Leiden, prothrombin gene mutation ([place]) (2006), Hyperlipidemia, Hypothyroidism, Noncompliance, Pelvic fracture ([place]) (2009), Pulmonary embolism ([place]) (2006), and Tobacco abuse.   Surgical History  Past Surgical History:  Procedure Laterality Date  . COLONOSCOPY  Approximately 2000   Negative screening study  . COLONOSCOPY WITH PROPOFOL N/A 05/28/2018   Procedure: COLONOSCOPY WITH PROPOFOL;  Surgeon: Rogene Houston, MD;  [facility];  Service: Endoscopy;  Laterality: N/A;  . CORONARY ANGIOPLASTY  2002, 2003, 2012  . ESOPHAGOGASTRODUODENOSCOPY (EGD) WITH PROPOFOL N/A 05/26/2018   Procedure: ESOPHAGOGASTRODUODENOSCOPY (EGD) WITH PROPOFOL;  Surgeon: Rogene Houston, MD;  Location[facility];  Service: Endoscopy;  Laterality: N/A;  . LEFT AND RIGHT  HEART CATHETERIZATION WITH CORONARY ANGIOGRAM N/A 04/03/2011   Procedure: LEFT AND RIGHT HEART CATHETERIZATION WITH CORONARY ANGIOGRAM;  Surgeon: Sherren Mocha, MD;  Location: [facility] CATH LAB;  Service: Cardiovascular;  Laterality: N/A;  . POLYPECTOMY  05/28/2018   Procedure: POLYPECTOMY;  Surgeon: Rogene Houston, MD;  Location: [facility];  Service: Endoscopy;;  cold snare and biopsy forcep    Social History   reports that she has been smoking cigarettes. She started smoking about 50 years ago. She has a 25.00 pack-year smoking history. She has never used smokeless tobacco. She reports that she does not drink alcohol or use drugs.   Family History   Her family history includes Alzheimer's disease in her mother; Emphysema in her mother and sister; Factor V Leiden deficiency in her daughter, father, and sister; Heart disease in her father; Kidney cancer in her paternal grandmother.   Allergies Allergies  Allergen Reactions  . Dilaudid [Hydromorphone Hcl] Hives and Nausea Only  . Minocycline Hcl     REACTION: Dizzy  . Prednisone     REACTION: feels like throat swelling, hallucinations  . Varenicline Tartrate     REACTION: Dizzy(chantix)   . Zocor [Simvastatin - High Dose] Other (See Comments)    myalgia    Home Medications  Prior to Admission medications   Medication Sig Start Date End Date Taking? Authorizing Provider  albuterol (PROVENTIL) (2.5 MG/3ML) 0.083% nebulizer solution USE 1 VIAL BY NEBULIZER EVERY 4 HOURS AS NEEDED FOR WHEEZING. DX: J44.9 Patient taking differently: Take 2.5 mg by nebulization every 4 (four) hours as needed for wheezing or shortness of breath.  01/24/17  Yes Magdalen Spatz, NP  arformoterol (BROVANA) 15 MCG/2ML NEBU Take 2 mLs (15 mcg total) by nebulization 2 (two) times daily. 09/07/17  Yes Juanito Doom, MD  baclofen (LIORESAL) 10 MG tablet Take 1 tablet (10 mg total) by mouth 3 (three) times daily. 01/24/18  Yes Lavina Hamman, MD  bisacodyl (DULCOLAX)  5 MG EC tablet Take 1 tablet (5 mg total) by mouth daily as needed for mild constipation. 05/29/18  Yes Shah, Pratik D, DO  bisoprolol (ZEBETA) 5 MG tablet Take 0.5 tablets (2.5 mg total) by mouth daily. 07/12/18 08/11/18 Yes Shah, Pratik D, DO  budesonide (PULMICORT) 0.5 MG/2ML nebulizer solution Take 2 mLs (0.5 mg total) by nebulization 2 (two) times daily. Dx: J43.9 01/24/17  Yes Magdalen Spatz, NP  buPROPion [facility] SR) 100 MG 12 hr tablet Take 100 mg by mouth every morning.   Yes [provider]  citalopram (CELEXA) 10 MG tablet Take 10 mg by mouth daily.  04/29/18  Yes [provider]  enoxaparin (LOVENOX) 120 MG/0.8ML injection Inject 0.73 mLs (110 mg total) into the skin daily for 10 days. 07/11/18 07/21/18 Yes Shah, Pratik D, DO  Glycerin-Hypromellose-PEG 400 (DRY EYE RELIEF DROPS) 0.2-0.2-1 % SOLN Place 1-2 drops into both eyes 2 (two) times a day.   Yes [provider]  HYDROcodone-acetaminophen (NORCO/VICODIN) 5-325 MG tablet Take 1 tablet by mouth every 6 (six) hours as needed (BREAKTHROUGH PAIN). 02/19/18  Yes Johnson, Clanford L, MD  ipratropium-albuterol (DUONEB) 0.5-2.5 (3) MG/3ML SOLN Take 3 mLs by nebulization 4 (four) times daily. 03/20/17  Yes Magdalen Spatz, NP  levothyroxine (SYNTHROID, LEVOTHROID) 125 MCG tablet Take 1 tablet (125 mcg total) by mouth daily. 08/16/17  Yes Nafziger, Tommi Rumps, NP  nitroGLYCERIN (NITROSTAT) 0.4 MG SL tablet Place 1 tablet (0.4 mg total) under the tongue every 5 (five) minutes as needed for chest pain. 10/28/14  Yes Herminio Commons, MD  Omega-3 Fatty Acids (FISH OIL) 600 MG CAPS Take 1 capsule by mouth daily.    Yes [provider]  pantoprazole (PROTONIX) 40 MG tablet Take 1 tablet (40 mg total) by mouth daily. Patient taking differently: Take 40 mg by mouth daily as needed (reflux/heartburn).  02/20/18  Yes Johnson, Clanford L, MD  rosuvastatin (CRESTOR) 5 MG tablet Take 1 tablet (5 mg total) by mouth daily at 6 PM. 07/11/18  08/10/18 Yes Shah, Pratik D, DO  ticagrelor (BRILINTA) 60 MG TABS tablet Take 1 tablet (60 mg total) by mouth 2 (two) times daily. 02/01/18  Yes Johnson, Clanford L, MD  traZODone (DESYREL) 50 MG tablet Take 50 mg by mouth at bedtime as needed for sleep.   Yes [provider]  warfarin (COUMADIN) 1 MG tablet Take 5-6 mg by mouth See admin instructions. On Sunday, Tuesday, Thursday, and Saturday, patient takes 53m; on all other days, patient takes 562m  Yes [provider]  ferrous sulfate 325 (65 FE) MG tablet Take 1 tablet (325 mg total) by mouth daily with breakfast. Patient not taking: Reported on 07/19/2018 07/11/18 08/10/18  ShHeath Lark, DO  Respiratory Therapy Supplies (FLUTTER) DEVI Use as directed 05/14/15   NeJavier GlazierMD        JuIna HomesMD IMTS PGY 3  Pager: [phone] Note:  [age]ear old female s/p cardiac arrest who presents to PCCM with recurrent respiratory failure with complete left main obstruction due to blood clots.  No events overnight.  Weaning well this AM with coarse BS on exam.  I reviewed CXR myself, left lung is open.  Discussed with resident.  Will proceed with extubation today.  If fails or obstructs again then will need to discuss trach placement with a size 8 as patient is not likely to be able to expectorate effectively.  Hold in the ICU overnight.  PCCM will continue to manage.  The patient is critically ill with multiple organ systems failure and requires high complexity decision making for assessment and support, frequent evaluation and titration of therapies, application of advanced monitoring technologies and extensive interpretation of multiple databases.   Critical Care Time devoted to patient care services described in this note is  32  Minutes. This time reflects time of care of this signee Dr WeJennet MaduroThis critical care time does not reflect procedure time, or teaching time or supervisory time of PA/NP/Med student/Med  Resident etc but could involve care discussion time.  WeRush FarmerM.D. [facility]/Critical Care Medicine. Pager: [phone] hours pager: [phone]

## 2018-07-30 NOTE — Progress Notes (Signed)
Chart reviewed for LOS; B Beulah Capobianco RN,MHA,BSN Advanced Care Supervisor [phone] 

## 2018-07-30 NOTE — Progress Notes (Signed)
ANTICOAGULATION CONSULT NOTE - Follow Up Consult  Pharmacy Consult for Heparin   Indication: atrial fibrillation  Allergies  Allergen Reactions  . Dilaudid [Hydromorphone Hcl] Hives and Nausea Only  . Minocycline Hcl     REACTION: Dizzy  . Prednisone     REACTION: feels like throat swelling, hallucinations  . Varenicline Tartrate     REACTION: Dizzy(chantix)   . Zocor [Simvastatin - High Dose] Other (See Comments)    myalgia    Patient Measurements: Height: 5\' 4"  (162.6 cm) Weight: 138 lb 0.1 oz (62.6 kg) IBW/kg (Calculated) : 54.7  Vital Signs: Temp: 99.3 F (37.4 C) (07/21 0500) Temp Source: Core (07/21 0002) BP: 119/50 (07/21 0500) Pulse Rate: 70 (07/21 0500)  Labs: Recent Labs    07/28/18 0350 07/28/18 1412  07/28/18 2020 07/29/18 0459 07/29/18 0807 07/29/18 1239 07/29/18 2049 07/30/18 0423  HGB 7.9* 7.7*  --   --  7.6* 6.5*  --   --  7.3*  HCT 23.9* 24.3*  --   --  23.7* 19.0*  --   --  23.6*  PLT 198 184  --   --  182  --   --   --  204  LABPROT 20.4*  --   --  17.4* 16.5*  --   --   --  16.0*  INR 1.8*  --   --  1.4* 1.4*  --   --   --  1.3*  HEPARINUNFRC  --   --    < > <0.10* <0.10*  --  0.10* 0.14* <0.10*  CREATININE 1.07*  --   --   --  0.94  --   --   --   --    < > = values in this interval not displayed.    Estimated Creatinine Clearance: 49.5 mL/min (by C-G formula based on SCr of 0.94 mg/dL).  Assessment: 68 yr old female with hx PE/DVT in setting of  Factor V Leiden on warfarin PTA which was reversed with [place] and Vitamin K on 07/17/18 d/t coagulapathy. No current plans for EGD per GI so CCM ordered anticoagulation to be restarted on 7/13.   IV heparin begun 7/15 pm, then stopped 7/16 am due to hemoptysis and INR up to 2.8, likely due to concurrent Bactrim IV for Stenotrophomonas pneumonia. PEA arrest 7/16, INR has trended up to 4 warfarin held INR > down to 1.4 today. Will hold warfarin for now. Discussed with CCM will start very low dose  heparin drip to balance clot vs bleed risk.   7/21 AM update: heparin level undetectable this morning, no issues per RN.   Goal of Therapy:  HL 0.15 - 0.2 units/mL Monitor platelets by anticoagulation protocol: Yes   Plan:  Increase heparin slightly to 1200 units/hr  Re-check heparin level in 6-8 hours Monitor heparin level, CBC, and S/S of bleeding daily   Narda Bonds, PharmD, BCPS Clinical Pharmacist Phone: [phone]

## 2018-07-30 NOTE — Progress Notes (Signed)
ANTICOAGULATION CONSULT NOTE - Follow Up Consult  Pharmacy Consult for Heparin   Indication: atrial fibrillation  Allergies  Allergen Reactions  . Dilaudid [Hydromorphone Hcl] Hives and Nausea Only  . Minocycline Hcl     REACTION: Dizzy  . Prednisone     REACTION: feels like throat swelling, hallucinations  . Varenicline Tartrate     REACTION: Dizzy(chantix)   . Zocor [Simvastatin - High Dose] Other (See Comments)    myalgia    Patient Measurements: Height: 5\' 4"  (162.6 cm) Weight: 138 lb 7.2 oz (62.8 kg) IBW/kg (Calculated) : 54.7  Vital Signs: Temp: 97.5 F (36.4 C) (07/21 1400) Temp Source: Core (07/21 1204) BP: 106/48 (07/21 1400) Pulse Rate: 89 (07/21 1400)  Labs: Recent Labs    07/28/18 1412  07/28/18 2020 07/29/18 0459 07/29/18 0807  07/29/18 2049 07/30/18 0423 07/30/18 0449 07/30/18 0636 07/30/18 1450  HGB 7.7*  --   --  7.6* 6.5*  --   --  7.3*  --   --   --   HCT 24.3*  --   --  23.7* 19.0*  --   --  23.6*  --   --   --   PLT 184  --   --  182  --   --   --  204  --   --   --   LABPROT  --   --  17.4* 16.5*  --   --   --  16.0*  --   --   --   INR  --   --  1.4* 1.4*  --   --   --  1.3*  --   --   --   HEPARINUNFRC  --    < > <0.10* <0.10*  --    < > 0.14* <0.10*  --   --  0.19*  CREATININE  --   --   --  0.94  --   --   --   --  0.87 0.89  --    < > = values in this interval not displayed.    Estimated Creatinine Clearance: 52.2 mL/min (by C-G formula based on SCr of 0.89 mg/dL).  Assessment: 68 yr old female with hx PE/DVT in setting of  Factor V Leiden on warfarin PTA which was reversed with [place] and Vitamin K on 07/17/18 d/t coagulapathy. No current plans for EGD per GI so CCM ordered anticoagulation to be restarted on 7/13.   IV heparin begun 7/15 pm, then stopped 7/16 am due to hemoptysis and INR up to 2.8, likely due to concurrent Bactrim IV for Stenotrophomonas pneumonia. PEA arrest 7/16, INR has trended up to 4 warfarin held INR > down to  1.4 today. Holding warfarin for now. Discussed with CCM: continuing ow dose heparin drip to balance clot vs bleed risk.  -heparin level= 0.19, INR= 1.3, hg= 7.3   Goal of Therapy:  HL 0.15 - 0.2 units/mL Monitor platelets by anticoagulation protocol: Yes   Plan:  No heparin changes needed Monitor heparin level, CBC   Hildred Laser, PharmD Clinical Pharmacist **Pharmacist phone directory can now be found on [email or web address] ([email or web address]).  Listed under [place].

## 2018-07-30 NOTE — Progress Notes (Signed)
OT Cancellation Note  Patient Details Name: [name] MRN: [id number] DOB: January 10, 1950   Cancelled Treatment:    Reason Eval/Treat Not Completed: Patient declined, no reason specified- pt declined participation due to pain and fatigue.  Will follow and see as able.   Delight Stare, OT Acute Rehabilitation Services Pager [phone] Office [phone]   Delight Stare 07/30/2018, 1:03 PM

## 2018-07-30 NOTE — Progress Notes (Signed)
[facility] Progress Note Patient Name: [name] DOB: 10-01-1950 MRN: [id number]   Date of Service  07/30/2018  HPI/Events of Note  Hyperkalemia - K+ = 5.2 and Creatinine = 0.87  eICU Interventions  Will order: 1. Repeat BMP at 7:15 AM.     Intervention Category Major Interventions: Electrolyte abnormality - evaluation and management  Sommer,Steven [name] 07/30/2018, 6:00 AM

## 2018-07-30 NOTE — Progress Notes (Signed)
Pt severely agitated and combative towards this RN and other staff many times tonight. Required multiple PRN medications to keep patient calm, relaxed, and cooperative. Will hold on decreasing sedation until morning rounds with MDs.

## 2018-07-30 NOTE — Procedures (Signed)
Extubation Procedure Note  Patient Details:   Name: [name] DOB: 1950/11/10 MRN: [id number]   Airway Documentation:  Airway 8.5 mm (Active)  Secured at (cm) 23 cm 07/30/18 0716  Measured From Lips 07/30/18 0716  Secured Location Left 07/30/18 0716  Secured By [employer] 07/30/18 0716  Tube Holder Repositioned Yes 07/30/18 0716  Cuff Pressure (cm H2O) 26 cm H2O 07/29/18 1948  Site Condition Dry 07/30/18 0716   Vent end date: 07/22/18 Vent end time: 1204   Evaluation  O2 sats: stable throughout Complications: No apparent complications Patient did tolerate procedure well. Bilateral Breath Sounds: Clear, Diminished   Yes  Leak test positive.  No stridor noted. Bilateral breath sounds present.  Ion Gonnella 07/30/2018, 10:34 AM

## 2018-07-30 NOTE — Progress Notes (Signed)
PT Cancellation Note  Patient Details Name: [name] MRN: [id number] DOB: March 21, 1950   Cancelled Treatment:    Reason Eval/Treat Not Completed: Patient declined, no reason specified.  Cancelled x2, One for chest pain and the afternoon because she didn't feel well. 07/30/2018  Donnella Sham, [employer] [phone]  (pager) [phone]  (office)   Tessie Fass Alonzo Owczarzak 07/30/2018, 5:58 PM

## 2018-07-31 ENCOUNTER — Inpatient Hospital Stay (HOSPITAL_COMMUNITY): Payer: Medicare Other

## 2018-07-31 DIAGNOSIS — R0902 Hypoxemia: Secondary | ICD-10-CM

## 2018-07-31 LAB — CBC
HCT: 24.8 % — ABNORMAL LOW (ref 36.0–46.0)
Hemoglobin: 7.4 g/dL — ABNORMAL LOW (ref 12.0–15.0)
MCH: 30 pg (ref 26.0–34.0)
MCHC: 29.8 g/dL — ABNORMAL LOW (ref 30.0–36.0)
MCV: 100.4 fL — ABNORMAL HIGH (ref 80.0–100.0)
Platelets: 247 10*3/uL (ref 150–400)
RBC: 2.47 MIL/uL — ABNORMAL LOW (ref 3.87–5.11)
RDW: 15 % (ref 11.5–15.5)
WBC: 5.8 10*3/uL (ref 4.0–10.5)
nRBC: 0 % (ref 0.0–0.2)

## 2018-07-31 LAB — BASIC METABOLIC PANEL
Anion gap: 8 (ref 5–15)
BUN: 9 mg/dL (ref 8–23)
CO2: 28 mmol/L (ref 22–32)
Calcium: 8.7 mg/dL — ABNORMAL LOW (ref 8.9–10.3)
Chloride: 97 mmol/L — ABNORMAL LOW (ref 98–111)
Creatinine, Ser: 0.91 mg/dL (ref 0.44–1.00)
GFR calc Af Amer: >60 mL/min (ref >60)
GFR calc non Af Amer: >60 mL/min (ref >60)
Glucose, Bld: 81 mg/dL (ref 70–99)
Potassium: 5.4 mmol/L — ABNORMAL HIGH (ref 3.5–5.1)
Sodium: 133 mmol/L — ABNORMAL LOW (ref 135–145)

## 2018-07-31 LAB — MAGNESIUM: Magnesium: 1.6 mg/dL — ABNORMAL LOW (ref 1.7–2.4)

## 2018-07-31 LAB — PROTIME-INR
INR: 1.2 (ref 0.8–1.2)
Prothrombin Time: 15.3 seconds — ABNORMAL HIGH (ref 11.4–15.2)

## 2018-07-31 LAB — PHOSPHORUS: Phosphorus: 4.8 mg/dL — ABNORMAL HIGH (ref 2.5–4.6)

## 2018-07-31 LAB — HEPARIN LEVEL (UNFRACTIONATED): Heparin Unfractionated: 0.26 IU/mL — ABNORMAL LOW (ref 0.30–0.70)

## 2018-07-31 MED ORDER — GABAPENTIN 100 MG PO CAPS: 100.0000 mg | ORAL_CAPSULE | Freq: Three times a day (TID) | ORAL | Status: DC

## 2018-07-31 MED ORDER — GLYCOPYRROLATE 0.2 MG/ML IJ SOLN
0.2000 mg | Freq: Two times a day (BID) | INTRAMUSCULAR | Status: DC
  Administered 2018-07-31 – 2018-08-12 (×23): 0.2 mg via INTRAVENOUS
  Filled 2018-07-31 (×23): qty 1

## 2018-07-31 MED ORDER — RESOURCE THICKENUP CLEAR PO POWD
ORAL | Status: DC | PRN
  Filled 2018-07-31: qty 125

## 2018-07-31 MED ORDER — OXYCODONE-ACETAMINOPHEN 5-325 MG PO TABS: 1.0000 | ORAL_TABLET | Freq: Four times a day (QID) | ORAL | Status: DC | PRN

## 2018-07-31 MED ORDER — OXYCODONE-ACETAMINOPHEN 5-325 MG PO TABS
1.0000 | ORAL_TABLET | Freq: Four times a day (QID) | ORAL | Status: DC | PRN
  Administered 2018-07-31 – 2018-08-12 (×24): 1 via ORAL
  Filled 2018-07-31 (×24): qty 1

## 2018-07-31 MED ORDER — ORAL CARE MOUTH RINSE
15.0000 mL | Freq: Two times a day (BID) | OROMUCOSAL | Status: DC
  Administered 2018-07-31 – 2018-08-10 (×13): 15 mL via OROMUCOSAL

## 2018-07-31 MED ORDER — MORPHINE SULFATE (PF) 2 MG/ML IV SOLN
2.0000 mg | INTRAVENOUS | Status: DC | PRN
  Administered 2018-07-31 – 2018-08-04 (×16): 2 mg via INTRAVENOUS
  Filled 2018-07-31 (×16): qty 1

## 2018-07-31 MED ORDER — MORPHINE SULFATE (PF) 2 MG/ML IV SOLN
2.0000 mg | INTRAVENOUS | Status: DC | PRN
  Administered 2018-07-31 (×2): 2 mg via INTRAVENOUS
  Filled 2018-07-31 (×2): qty 1

## 2018-07-31 MED ORDER — GUAIFENESIN ER 600 MG PO TB12
600.0000 mg | ORAL_TABLET | Freq: Two times a day (BID) | ORAL | Status: DC
  Administered 2018-07-31 – 2018-08-12 (×24): 600 mg via ORAL
  Filled 2018-07-31 (×24): qty 1

## 2018-07-31 NOTE — Progress Notes (Addendum)
NAME:  [name], MRN:  [id number], DOB:  1950-06-02, LOS: 26 ADMISSION DATE:  August 14, 2018, CONSULTATION DATE:  07/25/2018 REFERRING MD:  [name] Rolling MD, CHIEF COMPLAINT: S/p cardiac arrest    Brief History   68 yo F w/ PMHx Factor V Leiden on warfarin known to PCCM service seen earlier today. Re-consulted due to cardiac arrest  History of present illness   (History from account of other providers and EMR)  68 yr old with COPD, MI, HTN, and PE, Factor V Leiden on Warfarin (also on Brillinta) presented with large volume hematemesis on late hrs of Aug 14, 2018. Coded in [facility] ED for 12 minutes PEA. Coagulopathy reversed with Vit K, [place]. Transferred to [facility] started on PPI. Received transfusions and GI consulted for EGD ( wanted pt to be off AC for 5 days). Extubated on 7/9 and transferred out to Internal Medicine service. Back to ICU 7/11 for mucus plug and left lung collapse s/p thoracentesis was re-intubated and has a bronchoscopy on 7/11/ showed the whole left mainstem bronchus was occluded by mucous plug and thick secretions which extended all the way into the lower lobe. It was suctioned out and BAL was sent which was positive for Stenotrophomonas . Repeat Bronchosopy on 7/13 prior to extubation was negative. Extubated successfully on 7/13 and downgraded to Internal Medicine.   PCCM re-engaged 7/16 for increasing oxygen demand. Clinically appears to be pulm edema. Plan for diuresis and aim to support, hopefully avoid intubation. Evening of 7/16 experienced PEA arrest. Got epi, atropine, and dopamine. Intubated and transferred to the ICU for further evaluation and management. Repeat bronchoscopy 7/17.   Past Medical History   Past Medical History:  Diagnosis Date   Acute MI ([name])    x4, code blue x3   CAD (coronary artery disease) 2002   Inf STEMI-2002. 2003-cutting balloon + brachytherapy for restenosis; subsequent acute stent thrombosis 06/2010 requiring 2 separate interventions (Zeta  stent, then repeat cath with thrombectomy). focal basal inf AK, nl EF; 03/2011: Patent stents, minor nonobst  residual dz, nl EF; neg stress nuclear in 2008 and stress echo in 2009   Chronic anticoagulation    Warfarin plus ticagrelor   Chronic respiratory failure ([name]) 08/27/2013   On 2L 02   COPD (chronic obstructive pulmonary disease) ([place])    02 dependent   Factor 5 Leiden mutation, heterozygous ([name])    Factor V Leiden, prothrombin gene mutation ([name]) 2006   Hyperlipidemia    Hypothyroidism    Noncompliance    Pelvic fracture ([name]) 2009   Pulmonary embolism ([name]) 2006   Associated with deep vein thrombosis-2006; + factor V Leiden   Tobacco abuse    50 pack years   Significant Hospital Events   7/16 PEA arrest  7/16 intubated  Consults:  Cardiology   Procedures:  Intubation 7/16  Significant Diagnostic Tests:  Endoscopy 05/28/18 > Normal esophagus, normal stomach Bronch 7/12 > Lt lung mucus plug. Friable endobronchial lesion on right bronchus intermedius CXR 7/16> Worsening LLL opacity, possibly atelectasis however can consider PNA/aspiration. Pulmonary edema. Small right pleural effusion  CXR 07/25/2018  IMPRESSION: 1. Effusion and opacity in left base has worsened. The underlying opacity could be atelectasis. However, the findings are at least somewhat concerning for pneumonia or aspiration. Recommend clinical correlation. 2. Small right pleural effusion. 3. Mild pulmonary edema.  CXR 7/17: worsening L lung consolidation, pleural effusion   CXR 7/19: personal impression. ETT pulled back a couple of cm. LUL consolidation improved, LLL still significantly atelectatic. ?  Pleural effusion increasing.   ECHO 07/05/2018  FINDINGS Left Ventricle: The left ventricle has normal systolic function, with an ejection fraction of 60-65%. The cavity size was normal. Moderate basal septal hypertrophy. Otherwise, mild concentric LVH of remaining myocardium. Diastolic  dysfunction, grade  indeterminate. Elevated left ventricular end-diastolic pressure No evidence of left ventricular regional wall motion abnormalities.. Right Ventricle: The right ventricle has normal systolic function. The cavity was normal. There is no increase in right ventricular wall thickness. Left Atrium: Left atrial size was mildly dilated. Right Atrium: Right atrial size was normal in size. Right atrial pressure is estimated at 10 mmHg. Interatrial Septum: No atrial level shunt detected by color flow Doppler. Pericardium: Trivial pericardial effusion is present. Mitral Valve: The mitral valve is degenerative in appearance. Mild thickening of the mitral valve leaflet. Mild calcification of the mitral valve leaflet. There is mild mitral annular calcification present. Mitral valve regurgitation is mild by color  flow Doppler. Tricuspid Valve: The tricuspid valve is grossly normal. Tricuspid valve regurgitation is trivial by color flow Doppler. Aortic Valve: The aortic valve was not well visualized Mild thickening of the aortic valve. Mild calcification of the aortic valve. Aortic valve regurgitation was not visualized by color flow Doppler. Pulmonic Valve: The pulmonic valve was not well visualized. Pulmonic valve regurgitation is not visualized by color flow Doppler. Aorta: The aortic root is normal in size and structure. Venous: The inferior vena cava IVC is dilated. Unable to assess respiratory variation.  TTE 07/26/18   1. Severe hypokinesis of the left ventricular, basal-mid inferior wall, inferolateral wall and anterolateral wall.  2. The left ventricle has moderately reduced systolic function, with an ejection fraction of 35-40%. The cavity size was normal. Left ventricular diastolic function could not be evaluated.  3. Left atrial size was mildly dilated.  4. There is moderate mitral annular calcification present. Mitral valve regurgitation is moderate by color flow Doppler. The MR jet  is centrally-directed.  5. The aortic root is normal in size and structure.  6. When compared to the prior study: 07/05/2018, there is an extensive new wall motion abnormality involving the left circumflex artery and possibly also the right coronary artery distribution; there is a merked reduction in left ventricular function  and worsened mitral insufficiency.     Diastolic function was not assessed.  Micro Data:  Blood 7/8 >NGTD Sputum 7/11 >few stenotrophomonas  Pleural fluid 7/11>rare WBC, no organisms seen BAL 7/11>Stenotrophomonas Maltophilia >100k colonies.   Antimicrobials:  Unasyn 7/8 >> 7/11 vanc 7/11 >> 7/13 Zosyn 7/11 >>7/13 Bactrim 7/13 >   Interim history/subjective:  No acute events overnight.  Extubated on 7/21 and tolerating [place].  Off sedation and pressors.   Discussed code status. She is unsure if she wants to go back to a DNR. She is interested in talking with palliative care.   Objective   Blood pressure (!) 128/59, pulse (!) 112, temperature 98.1 F (36.7 C), temperature source Oral, resp. rate (!) 21, height _0  (1.626 m), weight 61 kg, SpO2 97 %.    Vent Mode: PSV;CPAP FiO2 (%):  [40 %] 40 % PEEP:  [5 cmH20] 5 cmH20 Pressure Support:  [5 cmH20] 5 cmH20   Intake/Output Summary (Last 24 hours) at 07/31/2018 0710 Last data filed at 07/31/2018 0500 Gross per 24 hour  Intake 1775.7 ml  Output 2775 ml  Net -999.3 ml   Filed Weights   07/29/18 0455 07/30/18 0500 07/31/18 0500  Weight: 62.6 kg 62.8 kg 61 kg  Examination: General: Elderly female in no acute distress HENT: AT/[place]; pupils reactive Pulm: Rhonchi bilaterally but improved breath sounds compared to prior  CV: Regular, no murmur Abdomen: Soft, nondistended, positive bowel sounds Extremities: No significant edema Skin: No rash Neuro: Alert and oriented x 3     Resolved Hospital Problem list   None  Assessment & Plan:   S/p PEA arrest  Acute on Chronic Hypoxic Hypercarbic  Respiratory Failure  Severe L atelectasis / collapse - Suspect respiratory driven. HS-troponin and EKG not consistent with acute ischemic event. - Repeat bronchoscopy on 7/20. Mucus plug removed. Significantly improved aeration of the left lung. - Extubated on 7/21. Tolerating [place] well.  - Continue guaifenesin and add glycopyrrolate  - Switch from fentanyl to morphine  - Consult palliative care for GOC - On bactrim for Stenotrophomonas   CAD; new wall motion abnormality on TTE 7/17 Atrial Fibrillation  - Continuing on ASA and Brilinta  - New wall motion abnormality in setting arrest, ? primary cause decompensation or secondary.  Likely the latter. No intervention possible per Cards at least for now. They have signed off.   Hematemesis 2/2 UGIB  - Remains on warfarin per pharmacy, INR rising.  May require vitamin K - PPI as ordered - Follow CBC, coags - GI follow-up when stable to do so  Dysphagia  - Speech consult today   Best practice:  Diet: Regular Pain/Anxiety/Delirium protocol (if indicated): intermittent fentanyl and versed VAP protocol (if indicated): Extubated DVT prophylaxis: on Warfarin and Brilinta INR >3; only SCDs  GI prophylaxis: PPI Glucose control: at goal on no Rx Mobility: Bedrest Code Status: FULL>>> needs a palliative consult and [place] conversation Family Communication: Unable to reach Disposition: Transfer out of ICU today   Labs   CBC: Recent Labs  Lab 07/28/18 0350 07/28/18 1412 07/29/18 0459 07/29/18 0807 07/30/18 0423 07/31/18 0434  WBC 8.6 8.6 8.4  --  7.2 5.8  HGB 7.9* 7.7* 7.6* 6.5* 7.3* 7.4*  HCT 23.9* 24.3* 23.7* 19.0* 23.6* 24.8*  MCV 91.9 94.2 93.7  --  95.9 100.4*  PLT 198 184 182  --  204 371   Basic Metabolic Panel: Recent Labs  Lab 07/26/18 0431 07/27/18 0239 07/28/18 0350 07/29/18 0459 07/29/18 0807 07/30/18 0449 07/30/18 0636 07/31/18 0434  NA 137 136 135 135 135 134* 131* 133*  K 3.6 4.0 4.3 4.7 4.4 5.2* 4.9 5.4*  CL  89* 86* 95* 101  --  100 99 97*  CO2 38* 39* 31 25  --  _0 GLUCOSE 78 114* 107* 106*  --  100* 123* 81  BUN 7* _1 --  _2 CREATININE 1.12* 1.37* 1.07* 0.94  --  0.87 0.89 0.91  CALCIUM 7.9* 7.8* 8.0* 8.0*  --  8.2* 8.1* 8.7*  MG 1.6* 2.1 1.9 1.9  --  1.7  --  1.6*  PHOS 2.0* 4.0 3.3 3.3  --   --   --  4.8*   GFR: Estimated Creatinine Clearance: 51.1 mL/min (by C-G formula based on SCr of 0.91 mg/dL). Recent Labs  Lab 07/28/18 1412 07/29/18 0459 07/30/18 0423 07/31/18 0434  WBC 8.6 8.4 7.2 5.8   Liver Function Tests: Recent Labs  Lab 07/25/18 1949 07/27/18 0239 07/28/18 0350 07/29/18 0459  AST 40  --   --   --   ALT 29  --   --   --   ALKPHOS 107  --   --   --  BILITOT 0.6  --   --   --   PROT 4.9*  --   --   --   ALBUMIN 2.1* 2.3* 2.2* 2.1*   No results for input(s): LIPASE, AMYLASE in the last 168 hours. No results for input(s): AMMONIA in the last 168 hours.  ABG    Component Value Date/Time   PHART 7.371 07/29/2018 0807   PCO2ART 46.0 07/29/2018 0807   PO2ART 85.0 07/29/2018 0807   HCO3 26.7 07/29/2018 0807   TCO2 28 07/29/2018 0807   ACIDBASEDEF 3.6 (H) 07/25/2018 1950   O2SAT 96.0 07/29/2018 0807    Coagulation Profile: Recent Labs  Lab 07/28/18 0350 07/28/18 2020 07/29/18 0459 07/30/18 0423 07/31/18 0434  INR 1.8* 1.4* 1.4* 1.3* 1.2   Cardiac Enzymes: No results for input(s): CKTOTAL, CKMB, CKMBINDEX, TROPONINI in the last 168 hours.  HbA1C: Hgb A1c MFr Bld  Date/Time Value Ref Range Status  08/25/2013 08:24 PM 6.0 (H) <5.7 % Final    Comment:    (NOTE)                                                                       According to the ADA Clinical Practice Recommendations for 2011, when HbA1c is used as a screening test:  >=6.5%   Diagnostic of Diabetes Mellitus           (if abnormal result is confirmed) 5.7-6.4%   Increased risk of developing Diabetes Mellitus References:Diagnosis and Classification of Diabetes  [place],Diabetes Care,2011,34(Suppl 1):S62-S69 and Standards of Medical Care in         Diabetes - 2011,Diabetes TXMI,6803,21 (Suppl 1):S11-S61.  06/22/2010 07:35 PM 5.7 (H) <5.7 % Final    Comment:    (NOTE)                                                                       According to the ADA Clinical Practice Recommendations for 2011, when HbA1c is used as a screening test:  >=6.5%   Diagnostic of Diabetes Mellitus           (if abnormal result is confirmed) 5.7-6.4%   Increased risk of developing Diabetes Mellitus References:Diagnosis and Classification of Diabetes Mellitus,Diabetes YYQM,2500,37(CWUGQ 1):S62-S69 and Standards of Medical Care in         Diabetes - 2011,Diabetes BVQX,4503,88 (Suppl 1):S11-S61.   CBG: Recent Labs  Lab 07/25/18 2309 07/26/18 0830  GLUCAP 95 101*   Review of Systems:   Unable to obtained. Patient intubated.   Past Medical History  She,  has a past medical history of Acute MI ([place]), CAD (coronary artery disease) (2002), Chronic anticoagulation, Chronic respiratory failure ([place]) (08/27/2013), COPD (chronic obstructive pulmonary disease) ([place]), Factor 5 Leiden mutation, heterozygous ([place]), Factor V Leiden, prothrombin gene mutation ([place]) (2006), Hyperlipidemia, Hypothyroidism, Noncompliance, Pelvic fracture ([place]) (2009), Pulmonary embolism ([place]) (2006), and Tobacco abuse.   Surgical History    Past Surgical History:  Procedure Laterality Date   COLONOSCOPY  Approximately 2000   Negative  screening study   COLONOSCOPY WITH PROPOFOL N/A 05/28/2018   Procedure: COLONOSCOPY WITH PROPOFOL;  Surgeon: Rogene Houston, MD;  Location[facility];  Service: Endoscopy;  Laterality: N/A;   CORONARY ANGIOPLASTY  2002, 2003, 2012   ESOPHAGOGASTRODUODENOSCOPY (EGD) WITH PROPOFOL N/A 05/26/2018   Procedure: ESOPHAGOGASTRODUODENOSCOPY (EGD) WITH PROPOFOL;  Surgeon: Rogene Houston, MD;  Location: [facility];  Service: Endoscopy;  Laterality: N/A;    LEFT AND RIGHT HEART CATHETERIZATION WITH CORONARY ANGIOGRAM N/A 04/03/2011   Procedure: LEFT AND RIGHT HEART CATHETERIZATION WITH CORONARY ANGIOGRAM;  Surgeon: Sherren Mocha, MD;  Location: [facility] CATH LAB;  Service: Cardiovascular;  Laterality: N/A;   POLYPECTOMY  05/28/2018   Procedure: POLYPECTOMY;  Surgeon: Rogene Houston, MD;  Location: [facility];  Service: Endoscopy;;  cold snare and biopsy forcep    Social History   reports that she has been smoking cigarettes. She started smoking about 50 years ago. She has a 25.00 pack-year smoking history. She has never used smokeless tobacco. She reports that she does not drink alcohol or use drugs.   Family History   Her family history includes Alzheimer's disease in her mother; Emphysema in her mother and sister; Factor V Leiden deficiency in her daughter, father, and sister; Heart disease in her father; Kidney cancer in her paternal grandmother.   Allergies Allergies  Allergen Reactions   Dilaudid [Hydromorphone Hcl] Hives and Nausea Only   Minocycline Hcl     REACTION: Dizzy   Prednisone     REACTION: feels like throat swelling, hallucinations   Varenicline Tartrate     REACTION: Dizzy(chantix)    Zocor [Simvastatin - High Dose] Other (See Comments)    myalgia    Home Medications  Prior to Admission medications   Medication Sig Start Date End Date Taking? Authorizing Provider  albuterol (PROVENTIL) (2.5 MG/3ML) 0.083% nebulizer solution USE 1 VIAL BY NEBULIZER EVERY 4 HOURS AS NEEDED FOR WHEEZING. DX: J44.9 Patient taking differently: Take 2.5 mg by nebulization every 4 (four) hours as needed for wheezing or shortness of breath.  01/24/17  Yes Magdalen Spatz, NP  arformoterol (BROVANA) 15 MCG/2ML NEBU Take 2 mLs (15 mcg total) by nebulization 2 (two) times daily. 09/07/17  Yes Juanito Doom, MD  baclofen (LIORESAL) 10 MG tablet Take 1 tablet (10 mg total) by mouth 3 (three) times daily. 01/24/18  Yes Lavina Hamman, MD    bisacodyl (DULCOLAX) 5 MG EC tablet Take 1 tablet (5 mg total) by mouth daily as needed for mild constipation. 05/29/18  Yes Shah, Pratik D, DO  bisoprolol (ZEBETA) 5 MG tablet Take 0.5 tablets (2.5 mg total) by mouth daily. 07/12/18 08/11/18 Yes Shah, Pratik D, DO  budesonide (PULMICORT) 0.5 MG/2ML nebulizer solution Take 2 mLs (0.5 mg total) by nebulization 2 (two) times daily. Dx: J43.9 01/24/17  Yes Magdalen Spatz, NP  buPROPion [facility] SR) 100 MG 12 hr tablet Take 100 mg by mouth every morning.   Yes [provider]  citalopram (CELEXA) 10 MG tablet Take 10 mg by mouth daily.  04/29/18  Yes [provider]  enoxaparin (LOVENOX) 120 MG/0.8ML injection Inject 0.73 mLs (110 mg total) into the skin daily for 10 days. 07/11/18 07/21/18 Yes Shah, Pratik D, DO  Glycerin-Hypromellose-PEG 400 (DRY EYE RELIEF DROPS) 0.2-0.2-1 % SOLN Place 1-2 drops into both eyes 2 (two) times a day.   Yes [provider]  HYDROcodone-acetaminophen (NORCO/VICODIN) 5-325 MG tablet Take 1 tablet by mouth every 6 (  six) hours as needed (BREAKTHROUGH PAIN). 02/19/18  Yes Johnson, Clanford L, MD  ipratropium-albuterol (DUONEB) 0.5-2.5 (3) MG/3ML SOLN Take 3 mLs by nebulization 4 (four) times daily. 03/20/17  Yes Magdalen Spatz, NP  levothyroxine (SYNTHROID, LEVOTHROID) 125 MCG tablet Take 1 tablet (125 mcg total) by mouth daily. 08/16/17  Yes Nafziger, Tommi Rumps, NP  nitroGLYCERIN (NITROSTAT) 0.4 MG SL tablet Place 1 tablet (0.4 mg total) under the tongue every 5 (five) minutes as needed for chest pain. 10/28/14  Yes Herminio Commons, MD  Omega-3 Fatty Acids (FISH OIL) 600 MG CAPS Take 1 capsule by mouth daily.    Yes [provider]  pantoprazole (PROTONIX) 40 MG tablet Take 1 tablet (40 mg total) by mouth daily. Patient taking differently: Take 40 mg by mouth daily as needed (reflux/heartburn).  02/20/18  Yes Johnson, Clanford L, MD  rosuvastatin (CRESTOR) 5 MG tablet Take 1 tablet (5 mg total) by  mouth daily at 6 PM. 07/11/18 08/10/18 Yes Shah, Pratik D, DO  ticagrelor (BRILINTA) 60 MG TABS tablet Take 1 tablet (60 mg total) by mouth 2 (two) times daily. 02/01/18  Yes Johnson, Clanford L, MD  traZODone (DESYREL) 50 MG tablet Take 50 mg by mouth at bedtime as needed for sleep.   Yes [provider]  warfarin (COUMADIN) 1 MG tablet Take 5-6 mg by mouth See admin instructions. On Sunday, Tuesday, Thursday, and Saturday, patient takes 62m; on all other days, patient takes 523m  Yes [provider]  ferrous sulfate 325 (65 FE) MG tablet Take 1 tablet (325 mg total) by mouth daily with breakfast. Patient not taking: Reported on 07/19/2018 07/11/18 08/10/18  ShHeath Lark, DO  Respiratory Therapy Supplies (FLUTTER) DEVI Use as directed 05/14/15   NeJavier GlazierMD        JuIna HomesMD IMTS PGY 3  Pager: [phone] Note:  [age]ear old female s/p cardiac arrest who presents to PCCM with repeated respiratory failure due to left main stem obstruction with phlegm and blood.  Has done well since extubation yesterday with no new complaints.  On exam, coarse BS with wet cough.  I reviewed CXR myself, infiltrate noted.  Discussed with PCCM-NP.    Acute respiratory failure:  - Monitor closely for airway protection  - If reintubated again will need trach  Airway obstruction due to inability to clear airway  - Flutter valve  - Vibravest  - Encourage cough and airway clearance  Hypoxemia:  - Titrate O2 for sat of 88-92%  Hemoptysis:  - Will need long term anticoagulation, will hold off for now  [facility]  Patient seen and examined, agree with above note.  I dictated the care and orders written for this patient under my direction.  YaRush FarmerMDEast [place]

## 2018-07-31 NOTE — Progress Notes (Signed)
Occupational Therapy Re-eval  Patient Details Name: [name] MRN: [id number] DOB: August 22, 1950 Today's Date: 07/31/2018    History of present illness 68 year old with Factor V Leiden on warfarin presented with hematemesis. Coded in [facility] ED for 12 minutes. Extubated 7/9, back to ICU 7/11 for mucus plug and L lung collapse s/p thoracentesis and reintubated 7/11-7/13. 7/16 PEA arrest. Intubated and transferred to ICU, repeat broncoscopy 7/17. Extubated 7/21. PMH: COPD, MI, HTN,anteriosclerotic cardiovascular disease, chronic respiratory failure, tobacco use, and PE.    OT comments  Patient supine in bed and agreeable to OT/PT with maximal encouragement.  Patient soiled in urine and assisted to [facility].  Patient requires min assist for bed mobility, mod assist +2 hand held assist to [facility], total assist for toielting and max-total assist for LB ADLs.  She is limited by decreased activity tolerance, generalized weakness, impaired balance and impaired cognition.  Attempted short blessed test (for cognition) but unable to complete-- recommend further assessment, but noted poor memory, attention, sequencing and safety. Updated dc plan to SNF. Will follow acutely.    Follow Up Recommendations  SNF;Supervision/Assistance - 24 hour    Equipment Recommendations  Other (comment)(TBD )    Recommendations for Other Services      Precautions / Restrictions Precautions Precautions: Fall Restrictions Weight Bearing Restrictions: No       Mobility Bed Mobility Overal bed mobility: Needs Assistance Bed Mobility: Supine to Sit;Sit to Supine     Supine to sit: Min assist Sit to supine: Min assist   General bed mobility comments: attempted to engage pt in log rolling technique for comfort and decreased pain, pt declined and transitioned to min assist for trunk support  Transfers Overall transfer level: Needs assistance Equipment used: 2 person hand held assist Transfers: Sit to/from [employer] Transfers Sit to Stand: Mod assist;+2 safety/equipment;+2 physical assistance Stand pivot transfers: Min assist;+2 physical assistance;+2 safety/equipment       General transfer comment: patient requires support to ascend, safety and balance, cueing for techniques    Balance Overall balance assessment: Needs assistance Sitting-balance support: No upper extremity supported;Feet supported Sitting balance-Leahy Scale: Fair     Standing balance support: Bilateral upper extremity supported;During functional activity Standing balance-Leahy Scale: Poor Standing balance comment: relaint on UE and external support                           ADL either performed or assessed with clinical judgement   ADL Overall ADL's : Needs assistance/impaired     Grooming: Minimal assistance;Sitting   Upper Body Bathing: Moderate assistance;Sitting   Lower Body Bathing: Maximal assistance;+2 for physical assistance;+2 for safety/equipment;Sit to/from stand   Upper Body Dressing : Moderate assistance;Sitting   Lower Body Dressing: Maximal assistance;+2 for physical assistance;+2 for safety/equipment;Sit to/from stand Lower Body Dressing Details (indicate cue type and reason): requires +2 mod assist sit<>stand, poor balance Toilet Transfer: Moderate assistance;+2 for physical assistance;+2 for safety/equipment;Stand-pivot;BSC   Toileting- Clothing Manipulation and Hygiene: +2 for physical assistance;Total assistance;Sit to/from stand Toileting - Clothing Manipulation Details (indicate cue type and reason): soiled in urine upon entry, total assist for hygiene and 2nd person to support balance in standing      Functional mobility during ADLs: Moderate assistance;+2 for physical assistance;+2 for safety/equipment General ADL Comments: pt limited by soreness, decreased act tolerance, impaired balance     Vision       Perception     Praxis  Cognition Arousal/Alertness:  Awake/alert Behavior During Therapy: Flat affect;Impulsive Overall Cognitive Status: No family/caregiver present to determine baseline cognitive functioning                                 General Comments: pt easily agitated, but cooperative today.  attempted short blessed test (unable to repeat name/address after 3 trials) reveals poor immediate memory, attention; requires 1 step commands         Exercises     Shoulder Instructions       General Comments VSS    Pertinent Vitals/ Pain       Pain Assessment: Faces Faces Pain Scale: Hurts little more Pain Location: chest soreness from CPR Pain Descriptors / Indicators: Discomfort;Guarding;Sore Pain Intervention(s): Monitored during session;Repositioned  Home Living                                          Prior Functioning/Environment              Frequency  Min 2X/week        Progress Toward Goals  OT Goals(current goals can now be found in the care plan section)     Acute Rehab OT Goals Patient Stated Goal: none stated Time For Goal Achievement: 08/14/18 Potential to Achieve Goals: Good  Plan Frequency remains appropriate;Discharge plan needs to be updated    Co-evaluation    PT/OT/SLP Co-Evaluation/Treatment: Yes Reason for Co-Treatment: For patient/therapist safety;Necessary to address cognition/behavior during functional activity;To address functional/ADL transfers   OT goals addressed during session: ADL's and self-care      AM-PAC OT "6 Clicks" Daily Activity     Outcome Measure   Help from another person eating meals?: Total Help from another person taking care of personal grooming?: A Little Help from another person toileting, which includes using toliet, bedpan, or urinal?: Total Help from another person bathing (including washing, rinsing, drying)?: A Lot Help from another person to put on and taking off regular upper body clothing?: A Lot Help from another  person to put on and taking off regular lower body clothing?: Total 6 Click Score: 10    End of Session Equipment Utilized During Treatment: Oxygen  OT Visit Diagnosis: Unsteadiness on feet (R26.81);Other abnormalities of gait and mobility (R26.89);Muscle weakness (generalized) (M62.81);Other symptoms and signs involving cognitive function   Activity Tolerance Patient tolerated treatment well   Patient Left in bed;with call bell/phone within reach;with bed alarm set   Nurse Communication Mobility status        Time: 8938-1017 OT Time Calculation (min): 24 min  Charges: OT General Charges $OT Visit: 1 Visit OT Evaluation $OT Re-eval: 1 Re-eval  Delight Stare, [place] Pager [phone] Office [phone]    Delight Stare 07/31/2018, 2:11 PM

## 2018-07-31 NOTE — Progress Notes (Signed)
Modified Barium Swallow Progress Note  Patient Details  Name: [name] MRN: [id number] Date of Birth: October 31, 1950  Today's Date: 07/31/2018  Modified Barium Swallow completed.  Full report located under Chart Review in the Imaging Section.  Brief recommendations include the following:  Clinical Impression  Pt presents with mild oropharyngeal dysphagia c/b delayed swallow initiation, decreased hyolaryngeal elevation and excursion, reduced laryngeal closure, and diminished sensation.  These deficits resulted in trace, silent penetration of thin liquid above the vocal folds.  Cued cough was beneficial to clear penetration, but was extremely uncomfortable for pt given hx of chest compressions x2.  Chin tuck did not prevent penetration.  Cup presentation did not prevent penetration.  Alternating liquid with heavier bolus did not clear penetration.  There was no penetration of puree, regular solid texture, or nectar thick liquid by cup or straw.  During pill simulation, there was brief vallecular stasis of tablet which cleared with spontaneous subsequent swallow.  Recommend regular texture diet with nectar thick liquids.   Swallow Evaluation Recommendations       SLP Diet Recommendations: Regular solids;Nectar thick liquid   Liquid Administration via: Cup;Straw   Medication Administration: Whole meds with liquid       Compensations: Slow rate;Small sips/bites   Postural Changes: Remain semi-upright after after feeds/meals (Comment)            Celedonio Savage, [place], [place] Office: [phone]; Pager (7/22): [phone] 07/31/2018,3:09 PM

## 2018-07-31 NOTE — Progress Notes (Signed)
ANTICOAGULATION CONSULT NOTE - Follow Up Consult  Pharmacy Consult for Heparin   Indication: atrial fibrillation  Allergies  Allergen Reactions  . Dilaudid [Hydromorphone Hcl] Hives and Nausea Only  . Minocycline Hcl     REACTION: Dizzy  . Prednisone     REACTION: feels like throat swelling, hallucinations  . Varenicline Tartrate     REACTION: Dizzy(chantix)   . Zocor [Simvastatin - High Dose] Other (See Comments)    myalgia    Patient Measurements: Height: 5\' 4"  (162.6 cm) Weight: 134 lb 7.7 oz (61 kg) IBW/kg (Calculated) : 54.7  Vital Signs: Temp: 97.2 F (36.2 C) (07/22 0803) Temp Source: Axillary (07/22 0803) BP: 128/59 (07/22 0600) Pulse Rate: 112 (07/22 0600)  Labs: Recent Labs    07/29/18 0459 07/29/18 0807  07/30/18 0423 07/30/18 0449 07/30/18 0636 07/30/18 1450 07/31/18 0434  HGB 7.6* 6.5*  --  7.3*  --   --   --  7.4*  HCT 23.7* 19.0*  --  23.6*  --   --   --  24.8*  PLT 182  --   --  204  --   --   --  247  LABPROT 16.5*  --   --  16.0*  --   --   --  15.3*  INR 1.4*  --   --  1.3*  --   --   --  1.2  HEPARINUNFRC <0.10*  --    < > <0.10*  --   --  0.19* 0.26*  CREATININE 0.94  --   --   --  0.87 0.89  --  0.91   < > = values in this interval not displayed.    Estimated Creatinine Clearance: 51.1 mL/min (by C-G formula based on SCr of 0.91 mg/dL).  Assessment: 68 yr old female with hx PE/DVT in setting of  Factor V Leiden on warfarin PTA which was reversed with [place] and Vitamin K on 07/17/18 d/t coagulapathy. No current plans for EGD per GI so CCM ordered anticoagulation to be restarted on 7/13.   IV heparin begun 7/15 pm, then stopped 7/16 am due to hemoptysis and INR up to 2.8, likely due to concurrent Bactrim IV for Stenotrophomonas pneumonia. PEA arrest 7/16, INR has trended up to 4 on 7/18 and holding warfarin for now. Discussed with CCM: continuing ow dose heparin drip to balance clot vs bleed risk.  -heparin level= 0.26, INR= 1.2, hg=  7.4 -RN reported nose bleed this am   Goal of Therapy:  HL 0.15 - 0.2 units/mL Monitor platelets by anticoagulation protocol: Yes   Plan:  Decrease heparin to 1150 units/hr Monitor heparin level, CBC   Hildred Laser, PharmD Clinical Pharmacist **Pharmacist phone directory can now be found on [email or web address] ([email or web address]).  Listed under [place].

## 2018-07-31 NOTE — Progress Notes (Deleted)
Physical Therapy Treatment Patient Details Name: [name] MRN: [id number] DOB: 12-Mar-1950 Today's Date: 07/31/2018    History of Present Illness 68 year old with Factor V Leiden on warfarin presented with hematemesis. Coded in [facility] ED for 12 minutes. Extubated 7/9, back to ICU 7/11 for mucus plug and L lung collapse s/p thoracentesis and reintubated 7/11-7/13. 7/16 PEA arrest. Intubated and transferred to ICU, repeat broncoscopy 7/17. Extubated 7/21. PMH: COPD, MI, HTN,anteriosclerotic cardiovascular disease, chronic respiratory failure, tobacco use, and PE.     PT Comments    Pt needing extra encouragement to get her to participate.  Pt agreed to get up to the toilet and this was used for treatment.  Emphasis on transition to EOB, sit to stand, pivot transfers, standing tolernace   Follow Up Recommendations  Supervision/Assistance - 24 hour;SNF     Equipment Recommendations  None recommended by PT    Recommendations for Other Services       Precautions / Restrictions Precautions Precautions: Fall Restrictions Weight Bearing Restrictions: No    Mobility  Bed Mobility Overal bed mobility: Needs Assistance Bed Mobility: Supine to Sit;Sit to Supine     Supine to sit: Min assist Sit to supine: Min assist   General bed mobility comments: attempted to engage pt in log rolling technique for comfort and decreased pain, pt declined and transitioned to min assist for trunk support  Transfers Overall transfer level: Needs assistance Equipment used: 2 person hand held assist Transfers: Sit to/from [employer] Transfers Sit to Stand: Mod assist;+2 safety/equipment;+2 physical assistance Stand pivot transfers: Min assist;+2 physical assistance;+2 safety/equipment       General transfer comment: patient requires support to ascend, safety and balance, cueing for techniques  Ambulation/Gait                 Stairs             Wheelchair Mobility     Modified Rankin (Stroke Patients Only)       Balance Overall balance assessment: Needs assistance Sitting-balance support: No upper extremity supported;Feet supported Sitting balance-Leahy Scale: Fair     Standing balance support: Bilateral upper extremity supported;During functional activity Standing balance-Leahy Scale: Poor Standing balance comment: relaint on UE and external support.  Stood with assist while compleating zj                            Cognition Arousal/Alertness: Awake/alert Behavior During Therapy: Flat affect;Impulsive Overall Cognitive Status: No family/caregiver present to determine baseline cognitive functioning                                 General Comments: pt easily agitated, but cooperative today.  attempted short blessed test (unable to repeat name/address after 3 trials) reveals poor immediate memory, attention; requires 1 step commands       Exercises      General Comments General comments (skin integrity, edema, etc.): vss      Pertinent Vitals/Pain Pain Assessment: Faces Faces Pain Scale: Hurts little more Pain Location: chest soreness from CPR Pain Descriptors / Indicators: Discomfort;Guarding;Sore Pain Intervention(s): Monitored during session;Repositioned    Home Living                      Prior Function            PT Goals (current goals can now be found  in the care plan section) Acute Rehab PT Goals Patient Stated Goal: none stated PT Goal Formulation: With patient Time For Goal Achievement: 08/10/18 Potential to Achieve Goals: Good Progress towards PT goals: Not progressing toward goals - comment(self limiting)    Frequency    Min 3X/week      PT Plan Current plan remains appropriate    Co-evaluation PT/OT/SLP Co-Evaluation/Treatment: Yes Reason for Co-Treatment: For patient/therapist safety PT goals addressed during session: Mobility/safety with mobility OT goals  addressed during session: ADL's and self-care      AM-PAC PT "6 Clicks" Mobility   Outcome Measure  Help needed turning from your back to your side while in a flat bed without using bedrails?: A Little Help needed moving from lying on your back to sitting on the side of a flat bed without using bedrails?: A Lot Help needed moving to and from a bed to a chair (including a wheelchair)?: A Lot Help needed standing up from a chair using your arms (e.g., wheelchair or bedside chair)?: A Little Help needed to walk in hospital room?: A Lot Help needed climbing 3-5 steps with a railing? : A Lot 6 Click Score: 14    End of Session   Activity Tolerance: Patient limited by pain;Other (comment)(and self limiting) Patient left: in bed;with call bell/phone within reach Nurse Communication: Mobility status PT Visit Diagnosis: Other abnormalities of gait and mobility (R26.89);Difficulty in walking, not elsewhere classified (R26.2)     Time: 1438-8875 PT Time Calculation (min) (ACUTE ONLY): 24 min  Charges:  $Therapeutic Activity: 8-22 mins                     07/31/2018  Donnella Sham, PT Acute Rehabilitation Services [phone]  (pager) [phone]  (office)   Tessie Fass Quintavious Rinck 07/31/2018, 2:35 PM

## 2018-07-31 NOTE — Progress Notes (Signed)
Nutrition Follow-up  DOCUMENTATION CODES:   Non-severe (moderate) malnutrition in context of chronic illness  INTERVENTION:   - Magic cup TID with meals, each supplement provides 290 kcal and 9 grams of protein  - d/c tube feeding orders  NUTRITION DIAGNOSIS:   Moderate Malnutrition related to chronic illness (COPD) as evidenced by mild fat depletion, mild muscle depletion, moderate muscle depletion, percent weight loss (16.9% weight loss in 5 months).  Ongoing, being addressed via diet advancement and oral nutrition supplements  GOAL:   Patient will meet greater than or equal to 90% of their needs  Progressing  MONITOR:   PO intake, Supplement acceptance, Diet advancement, Labs, I & O's, Weight trends  REASON FOR ASSESSMENT:   Consult Enteral/tube feeding initiation and management  ASSESSMENT:   68 year old female who presented to the ED on 7/07 with large volume hematemesis. Pt became unresponsive in PEA arrest and CPR initiated. ROSC was achieved. Pt required intubation in the ED. PMH of MI, factor V Leiden, COPD. TTM not pursued as pt was following commands post arrest.  7/09 - extubated, clear liquid diet 7/11 - returned to ICU for mucus plug, right lung collapse, s/p thoracentesis, bronchoscopy revealing RML mass 7/13 - bronchoscopy, extubated 7/14 - diet briefly advanced to Dysphagia 3 then back to NPO 7/16 - 2 episodes of respiratory distress, chest x-rays revealing pulmonary edema, hemoptysis, Code Blue due to PEA arrest x 2, intubated, no TTM 7/17 - bronchoscopy revealing left mainstem obstructed with blood clot and mucus 7/20 - bronchoscopy with clot removal of left mainstem 7/21 - extubated 7/22 - MBS, diet advanced to Heart healthy with nectar-thick liquids  Discussed pt with RN and during ICU rounds. Palliative Care Team consult pending.  Weight down a total of 20 lbs when compared to admission weight. Will continue to monitor trends. Pt continues to  have edema to BUE and BLE.  Spoke with pt at beside. Pt reports that she isn't feeling well but that she does think she could eat if she were given some food. Pt is amenable to RD ordering an oral nutrition supplement to aid her in meeting kcal and protein needs. Will order Magic Cups with meals as pt is on nectar-thick liquids and follow for acceptance.  Prior to reintubation, pt was going to have an NG tube placed for enteral nutrition due to concerns for aspiration. RD will monitor for pt's ability to meet nutritional needs via PO intake alone.  Medications reviewed and include: Protonix, heparin, IV abx  Labs reviewed: sodium 133, potassium 5.4, phosphorus 4.8, magnesium 1.6, hemoglobin 7.4  UOP 2775 ml x 24 hours I/O's: +3.2 L since admit  Diet Order:   Diet Order            Diet Heart Room service appropriate? Yes; Fluid consistency: Nectar Thick  Diet effective now              EDUCATION NEEDS:   No education needs have been identified at this time  Skin:  Skin Assessment: Reviewed RN Assessment  Last BM:  07/29/18  Height:   Ht Readings from Last 1 Encounters:  07/16/18 5\' 4"  (1.626 m)    Weight:   Wt Readings from Last 1 Encounters:  07/31/18 61 kg    Ideal Body Weight:  54.5 kg  BMI:  Body mass index is 23.08 kg/m.  Estimated Nutritional Needs:   Kcal:  1700-1900  Protein:  85-100 grams  Fluid:  >/=1.7 L    Jeb Levering  Vertell Limber, MS, RD, [place] Dietitian Pager: [phone] Weekend/After Hours: [phone]

## 2018-07-31 NOTE — Progress Notes (Signed)
Pt transported to 6N 06 via bed, transferred to unit bed x 3 assist. Pt tolerated NAD.

## 2018-07-31 NOTE — Evaluation (Signed)
Clinical/Bedside Swallow Evaluation Patient Details  Name: [name] MRN: [id number] Date of Birth: 1950/02/06  Today's Date: 07/31/2018 Time: SLP Start Time (ACUTE ONLY): 0908 SLP Stop Time (ACUTE ONLY): 0915 SLP Time Calculation (min) (ACUTE ONLY): 7 min  Past Medical History:  Past Medical History:  Diagnosis Date  . Acute MI ([place])    x4, code blue x3  . CAD (coronary artery disease) 2002   Inf STEMI-2002. 2003-cutting balloon + brachytherapy for restenosis; subsequent acute stent thrombosis 06/2010 requiring 2 separate interventions (Zeta stent, then repeat cath with thrombectomy). focal basal inf AK, nl EF; 03/2011: Patent stents, minor nonobst  residual dz, nl EF; neg stress nuclear in 2008 and stress echo in 2009  . Chronic anticoagulation    Warfarin plus ticagrelor  . Chronic respiratory failure ([place]) 08/27/2013   On 2L 02  . COPD (chronic obstructive pulmonary disease) ([place])    02 dependent  . Factor 5 Leiden mutation, heterozygous ([place])   . Factor V Leiden, prothrombin gene mutation ([place]) 2006  . Hyperlipidemia   . Hypothyroidism   . Noncompliance   . Pelvic fracture ([place]) 2009  . Pulmonary embolism ([place]) 2006   Associated with deep vein thrombosis-2006; + factor V Leiden  . Tobacco abuse    50 pack years   Past Surgical History:  Past Surgical History:  Procedure Laterality Date  . COLONOSCOPY  Approximately 2000   Negative screening study  . COLONOSCOPY WITH PROPOFOL N/A 05/28/2018   Procedure: COLONOSCOPY WITH PROPOFOL;  Surgeon: Rogene Houston, MD;  Location: [facility];  Service: Endoscopy;  Laterality: N/A;  . CORONARY ANGIOPLASTY  2002, 2003, 2012  . ESOPHAGOGASTRODUODENOSCOPY (EGD) WITH PROPOFOL N/A 05/26/2018   Procedure: ESOPHAGOGASTRODUODENOSCOPY (EGD) WITH PROPOFOL;  Surgeon: Rogene Houston, MD;  Location: [facility];  Service: Endoscopy;  Laterality: N/A;  . LEFT AND RIGHT HEART CATHETERIZATION WITH CORONARY ANGIOGRAM N/A 04/03/2011   Procedure: LEFT AND RIGHT HEART CATHETERIZATION WITH CORONARY ANGIOGRAM;  Surgeon: Sherren Mocha, MD;  Location: [facility] CATH LAB;  Service: Cardiovascular;  Laterality: N/A;  . POLYPECTOMY  05/28/2018   Procedure: POLYPECTOMY;  Surgeon: Rogene Houston, MD;  Location: [facility];  Service: Endoscopy;;  cold snare and biopsy forcep   HPI:  On 7/16 pt was intubated for a third time with extubation on 7/21.  Mucous plugging removed with bronchoscopy. Pt has had CPR twice during this admission. RN reports improving cough and vocal quality from 7/21 PM to 7/22 AM.   Assessment / Plan / Recommendation Clinical Impression  Pt presents with moderate risk for aspiration in setting of multiple prolonged intubations.  Pt's vocal quality is hoarse and breath.  Pt exhibits weak voluntary cough.  Per RN, pt was unable/unwilling to cough while in respiratory distress last night.  With encouragement she was able to cough up some secretions, and she has continued to improve since then.  Pt tolerated consistencies trialed with no overt s/s of aspiration; however, laryngeal elevation was seemingly decreased on palpation.  Given pt hx, including multiple prolonged intubation, CPR x2, recent bronchoscopies to remove mucous plugging, pt would benefit from MBSS prior to initiation of PO diet. SLP Visit Diagnosis: Dysphagia, unspecified (R13.10)    Aspiration Risk  Moderate aspiration risk    Diet Recommendation NPO        Other  Recommendations Oral Care Recommendations: Oral care BID   Follow up Recommendations   pending results of MBSS     Frequency and Duration  pending results of MBSS       Prognosis   Good     Swallow Study   General Date of Onset: 07/17/18 HPI: On 7/16 pt was intubated for a third time with extubation on 7/21.  Mucous plugging removed with bronchoscopy. Pt has had CPR twice during this admission. RN reports improving cough and vocal quality from 7/21 PM to 7/22 AM. Type of  Study: Bedside Swallow Evaluation Previous Swallow Assessment: evaluated prior to reintubation Diet Prior to this Study: NPO Temperature Spikes Noted: No Respiratory Status: Nasal cannula History of Recent Intubation: Yes Length of Intubations (days): 11 days(across three intubations) Date extubated: 07/30/18 Behavior/Cognition: Alert;Cooperative;Pleasant mood Oral Cavity Assessment: Within Functional Limits Oral Care Completed by SLP: No Oral Cavity - Dentition: Dentures, top(bottom dentures not in place) Vision: Functional for self-feeding Patient Positioning: Upright in bed Baseline Vocal Quality: Breathy;Hoarse Volitional Cough: Weak Volitional Swallow: Able to elicit    Oral/Motor/Sensory Function Facial ROM: Within Functional Limits Facial Symmetry: Within Functional Limits Lingual ROM: Within Functional Limits Lingual Symmetry: Within Functional Limits Lingual Strength: Reduced Velum: Within Functional Limits Mandible: Within Functional Limits   Ice Chips     Thin Liquid Thin Liquid: Impaired Presentation: Cup Pharyngeal  Phase Impairments: Decreased hyoid-laryngeal movement    Nectar Thick Nectar Thick Liquid: Not tested   Honey Thick Honey Thick Liquid: Not tested   Puree Puree: Within functional limits Presentation: Spoon   Solid     Solid: Within functional limits Presentation: [place], [place], [place] Office: [phone]; Pager (7/22): [phone] 07/31/2018,10:04 AM

## 2018-07-31 NOTE — Evaluation (Signed)
Physical Therapy Evaluation Patient Details Name: [name] MRN: [id number] DOB: [birth year] Today's Date: 07/31/2018   History of Present Illness  68 year old with Factor V Leiden on warfarin presented with hematemesis. Coded in [facility] ED for 12 minutes. Extubated 7/9, back to ICU 7/11 for mucus plug and L lung collapse s/p thoracentesis and reintubated 7/11-7/13. 7/16 PEA arrest. Intubated and transferred to ICU, repeat broncoscopy 7/17. Extubated 7/21. PMH: COPD, MI, HTN,anteriosclerotic cardiovascular disease, chronic respiratory failure, tobacco use, and PE.   Clinical Impression  Pt needing extra encouragement lately after the most recent events --PEA arrest and intubation.  She is displaying self limiting behaviors connected with increases in her chest pain post CPR.    Follow Up Recommendations Supervision/Assistance - 24 hour;SNF    Equipment Recommendations  None recommended by PT    Recommendations for Other Services       Precautions / Restrictions Precautions Precautions: Fall Restrictions Weight Bearing Restrictions: No      Mobility  Bed Mobility Overal bed mobility: Needs Assistance Bed Mobility: Supine to Sit;Sit to Supine     Supine to sit: Min assist Sit to supine: Min assist   General bed mobility comments: attempted to engage pt in log rolling technique for comfort and decreased pain, pt declined and transitioned to min assist for trunk support  Transfers Overall transfer level: Needs assistance Equipment used: 2 person hand held assist Transfers: Sit to/from [employer] Transfers Sit to Stand: Mod assist;+2 safety/equipment;+2 physical assistance Stand pivot transfers: Min assist;+2 physical assistance;+2 safety/equipment       General transfer comment: patient requires support to ascend, safety and balance, cueing for techniques  Ambulation/Gait                Stairs            Wheelchair Mobility    Modified  [place]in (Stroke Patients Only)       Balance Overall balance assessment: Needs assistance Sitting-balance support: No upper extremity supported;Feet supported Sitting balance-Leahy Scale: Fair     Standing balance support: Bilateral upper extremity supported;During functional activity Standing balance-Leahy Scale: Poor Standing balance comment: relaint on UE and external support.  Stood with assist while compleating zj                             Pertinent Vitals/Pain Pain Assessment: Faces Faces Pain Scale: Hurts little more Pain Location: chest soreness from CPR Pain Descriptors / Indicators: Discomfort;Guarding;Sore Pain Intervention(s): Monitored during session;Repositioned    Home Living                        Prior Function                 Hand Dominance        Extremity/Trunk Assessment   Upper Extremity Assessment Upper Extremity Assessment: Generalized weakness    Lower Extremity Assessment Lower Extremity Assessment: Generalized weakness       Communication      Cognition Arousal/Alertness: Awake/alert Behavior During Therapy: Flat affect;Impulsive Overall Cognitive Status: No family/caregiver present to determine baseline cognitive functioning                                 General Comments: pt easily agitated, but cooperative today.  attempted short blessed test (unable to repeat name/address after 3 trials) reveals poor immediate  memory, attention; requires 1 step commands       General Comments General comments (skin integrity, edema, etc.): vss    Exercises     Assessment/Plan    PT Assessment    PT Problem List         PT Treatment Interventions      PT Goals (Current goals can be found in the Care Plan section)  Acute Rehab PT Goals Patient Stated Goal: none stated PT Goal Formulation: With patient Time For Goal Achievement: 08/10/18 Potential to Achieve Goals: Good    Frequency Min  3X/week   Barriers to discharge        Co-evaluation PT/OT/SLP Co-Evaluation/Treatment: Yes Reason for Co-Treatment: For patient/therapist safety PT goals addressed during session: Mobility/safety with mobility OT goals addressed during session: ADL's and self-care       AM-PAC PT "6 Clicks" Mobility  Outcome Measure Help needed turning from your back to your side while in a flat bed without using bedrails?: A Little Help needed moving from lying on your back to sitting on the side of a flat bed without using bedrails?: A Lot Help needed moving to and from a bed to a chair (including a wheelchair)?: A Lot Help needed standing up from a chair using your arms (e.g., wheelchair or bedside chair)?: A Little Help needed to walk in hospital room?: A Lot Help needed climbing 3-5 steps with a railing? : A Lot 6 Click Score: 14    End of Session   Activity Tolerance: Patient limited by pain;Other (comment)(and self limiting) Patient left: in bed;with call bell/phone within reach Nurse Communication: Mobility status PT Visit Diagnosis: Other abnormalities of gait and mobility (R26.89);Difficulty in walking, not elsewhere classified (R26.2)    Time: 6484-7207 PT Time Calculation (min) (ACUTE ONLY): 24 min   Charges:              07/31/2018  Donnella Sham, PT Acute Rehabilitation Services [phone]  (pager) [phone]  (office)  Tessie Fass Lillianna Sabel 07/31/2018, 2:42 PM

## 2018-07-31 NOTE — Progress Notes (Signed)
Patient to be transferred out of the ICU today. Spoke with Dr. Darliss Cheney. [facility] will assume care on 08/01/2018 at [place].

## 2018-08-01 ENCOUNTER — Inpatient Hospital Stay (HOSPITAL_COMMUNITY): Payer: Medicare Other

## 2018-08-01 DIAGNOSIS — Z7189 Other specified counseling: Secondary | ICD-10-CM

## 2018-08-01 DIAGNOSIS — R079 Chest pain, unspecified: Secondary | ICD-10-CM

## 2018-08-01 DIAGNOSIS — Z515 Encounter for palliative care: Secondary | ICD-10-CM

## 2018-08-01 LAB — MAGNESIUM: Magnesium: 1.7 mg/dL (ref 1.7–2.4)

## 2018-08-01 LAB — CBC
HCT: 25.4 % — ABNORMAL LOW (ref 36.0–46.0)
Hemoglobin: 7.9 g/dL — ABNORMAL LOW (ref 12.0–15.0)
MCH: 30.2 pg (ref 26.0–34.0)
MCHC: 31.1 g/dL (ref 30.0–36.0)
MCV: 96.9 fL (ref 80.0–100.0)
Platelets: 291 10*3/uL (ref 150–400)
RBC: 2.62 MIL/uL — ABNORMAL LOW (ref 3.87–5.11)
RDW: 14.8 % (ref 11.5–15.5)
WBC: 5.1 10*3/uL (ref 4.0–10.5)
nRBC: 0 % (ref 0.0–0.2)

## 2018-08-01 LAB — BASIC METABOLIC PANEL
Anion gap: 11 (ref 5–15)
BUN: 9 mg/dL (ref 8–23)
CO2: 29 mmol/L (ref 22–32)
Calcium: 9 mg/dL (ref 8.9–10.3)
Chloride: 92 mmol/L — ABNORMAL LOW (ref 98–111)
Creatinine, Ser: 1 mg/dL (ref 0.44–1.00)
GFR calc Af Amer: >60 mL/min (ref >60)
GFR calc non Af Amer: 58 mL/min — ABNORMAL LOW (ref >60)
Glucose, Bld: 84 mg/dL (ref 70–99)
Potassium: 5.3 mmol/L — ABNORMAL HIGH (ref 3.5–5.1)
Sodium: 132 mmol/L — ABNORMAL LOW (ref 135–145)

## 2018-08-01 LAB — PROCALCITONIN: Procalcitonin: 0.14 ng/mL

## 2018-08-01 LAB — PROTIME-INR
INR: 1.1 (ref 0.8–1.2)
Prothrombin Time: 13.6 seconds (ref 11.4–15.2)

## 2018-08-01 LAB — TROPONIN I (HIGH SENSITIVITY)
Troponin I (High Sensitivity): 16 ng/L
Troponin I (High Sensitivity): 18 ng/L — ABNORMAL HIGH

## 2018-08-01 LAB — PHOSPHORUS: Phosphorus: 4.3 mg/dL (ref 2.5–4.6)

## 2018-08-01 LAB — HEPARIN LEVEL (UNFRACTIONATED): Heparin Unfractionated: 0.24 IU/mL — ABNORMAL LOW (ref 0.30–0.70)

## 2018-08-01 MED ORDER — CITALOPRAM HYDROBROMIDE 10 MG PO TABS
10.0000 mg | ORAL_TABLET | Freq: Every day | ORAL | Status: DC
  Administered 2018-08-01 – 2018-08-12 (×12): 10 mg via ORAL
  Filled 2018-08-01 (×12): qty 1

## 2018-08-01 MED ORDER — LEVOTHYROXINE SODIUM 25 MCG PO TABS
125.0000 ug | ORAL_TABLET | Freq: Every day | ORAL | Status: DC
  Administered 2018-08-02 – 2018-08-12 (×10): 125 ug via ORAL
  Filled 2018-08-01 (×10): qty 1

## 2018-08-01 MED ORDER — BUPROPION HCL 100 MG PO TABS
50.0000 mg | ORAL_TABLET | Freq: Two times a day (BID) | ORAL | Status: DC
  Administered 2018-08-02 – 2018-08-07 (×4): 50 mg via ORAL
  Filled 2018-08-01 (×24): qty 0.5

## 2018-08-01 MED ORDER — PANTOPRAZOLE SODIUM 40 MG PO TBEC
40.0000 mg | DELAYED_RELEASE_TABLET | Freq: Two times a day (BID) | ORAL | Status: DC
  Administered 2018-08-01 – 2018-08-12 (×23): 40 mg via ORAL
  Filled 2018-08-01 (×23): qty 1

## 2018-08-01 NOTE — Progress Notes (Addendum)
PROGRESS NOTE    [name]  BMW:[id number] DOB: [birth year] DOA: 2018/07/19 PCP: [name], NP   Brief Narrative:  68 yr old withCOPD, MI, HTN, and PE,Factor V Leiden onWarfarin(also on Brillinta)presented with large volumehematemesison late hrs of 2018/07/19. Coded in [facility] ED for [place]. Coagulopathy reversed with Vit K, [place].Transferred to [place] PPI. Received transfusions and GI consulted for EGD ( wanted pt to be off AC for 5 days).Extubatedon 7/9and transferred out to Internal Medicine service.Back to ICU 7/11 for mucus plug and left lung collapses/p thoracentesis was re-intubated and has a bronchoscopy on 7/11/ showed the whole left mainstem bronchus was occluded by mucous plug and thick secretions which extended all the way into the lower lobe. It was suctioned out and BAL was sent which was positive for Stenotrophomonas . Repeat Bronchosopy on 7/13 prior to extubation was negative. Extubated successfully on 7/13 and downgraded to Internal Medicine.  PCCM re-engaged 7/16 for increasing oxygen demand. Clinically appears to be pulm edema.Plan fordiuresisand aim to support, hopefully avoid intubation. Evening of 7/16 experienced PEA arrest. Got epi, atropine, and dopamine. Intubated and transferred to the ICU for further evaluation and management. Repeat bronchoscopy 7/17.  And transfer back to [facility] on 08/01/2018  Consultants:   None  Procedures:   Intubated 19-Jul-2018 and extubated 07/18/2018  Left-sided thoracentesis on 07/20/2018  Bronchoscopy on 07/20/2018  Reintubated on 07/20/2018 and extubated on 07/22/2018  Cardiac arrest on 07/25/2018.  Repeat bronchoscopy on 07/26/2018  Antimicrobials:   Bactrim DS   Subjective: Patient seen and examined at around 8 in the morning.  She complained of chest pain which has been ongoing since last night and has been pressure-like and 8 out of 10, centrally located with no radiation and no aggravating  or relieving factors.  She also had some nausea.  No other complaint.  Objective: Vitals:   07/31/18 2036 08/01/18 0004 08/01/18 0420 08/01/18 0816  BP:  129/63 (!) 120/50   Pulse:  (!) 103 98   Resp:  19 20   Temp:      TempSrc:      SpO2: 97% 94% 92% 93%  Weight:   61.4 kg   Height:        Intake/Output Summary (Last 24 hours) at 08/01/2018 1013 Last data filed at 08/01/2018 0415 Gross per 24 hour  Intake 1125.29 ml  Output 200 ml  Net 925.29 ml   Filed Weights   07/30/18 0500 07/31/18 0500 08/01/18 0420  Weight: 62.8 kg 61 kg 61.4 kg    Examination:  General exam: Appears calm and comfortable  Respiratory system: Poor inspiratory effort due to pain.  Diminished breath sounds at the middle and lower lobes bilaterally. Cardiovascular system: S1 & S2 heard, RRR. No JVD, murmurs, rubs, gallops or clicks. No pedal edema. Gastrointestinal system: Abdomen is nondistended, soft and nontender. No organomegaly or masses felt. Normal bowel sounds heard. Central nervous system: Alert and oriented. No focal neurological deficits. Extremities: Symmetric 5 x 5 power. Skin: No rashes, lesions or ulcers Psychiatry: Judgement and insight appear normal. Mood & affect appropriate.    Data Reviewed: I have personally reviewed following labs and imaging studies  CBC: Recent Labs  Lab 07/28/18 1412 07/29/18 0459 07/29/18 0807 07/30/18 0423 07/31/18 0434 08/01/18 0425  WBC 8.6 8.4  --  7.2 5.8 5.1  HGB 7.7* 7.6* 6.5* 7.3* 7.4* 7.9*  HCT 24.3* 23.7* 19.0* 23.6* 24.8* 25.4*  MCV 94.2 93.7  --  95.9 100.4* 96.9  PLT  184 182  --  204 247 867   Basic Metabolic Panel: Recent Labs  Lab 07/27/18 0239 07/28/18 0350 07/29/18 0459 07/29/18 0807 07/30/18 0449 07/30/18 0636 07/31/18 0434 08/01/18 0425  NA 136 135 135 135 134* 131* 133* 132*  K 4.0 4.3 4.7 4.4 5.2* 4.9 5.4* 5.3*  CL 86* 95* 101  --  100 99 97* 92*  CO2 39* 31 25  --  _0 GLUCOSE 114* 107* 106*  --  100*  123* 81 84  BUN _1 --  _2 CREATININE 1.37* 1.07* 0.94  --  0.87 0.89 0.91 1.00  CALCIUM 7.8* 8.0* 8.0*  --  8.2* 8.1* 8.7* 9.0  MG 2.1 1.9 1.9  --  1.7  --  1.6* 1.7  PHOS 4.0 3.3 3.3  --   --   --  4.8* 4.3   GFR: Estimated Creatinine Clearance: 46.5 mL/min (by C-G formula based on SCr of 1 mg/dL). Liver Function Tests: Recent Labs  Lab 07/25/18 1949 07/27/18 0239 07/28/18 0350 07/29/18 0459  AST 40  --   --   --   ALT 29  --   --   --   ALKPHOS 107  --   --   --   BILITOT 0.6  --   --   --   PROT 4.9*  --   --   --   ALBUMIN 2.1* 2.3* 2.2* 2.1*   No results for input(s): LIPASE, AMYLASE in the last 168 hours. No results for input(s): AMMONIA in the last 168 hours. Coagulation Profile: Recent Labs  Lab 07/28/18 2020 07/29/18 0459 07/30/18 0423 07/31/18 0434 08/01/18 0425  INR 1.4* 1.4* 1.3* 1.2 1.1   Cardiac Enzymes: No results for input(s): CKTOTAL, CKMB, CKMBINDEX, TROPONINI in the last 168 hours. BNP (last 3 results) No results for input(s): PROBNP in the last 8760 hours. HbA1C: No results for input(s): HGBA1C in the last 72 hours. CBG: Recent Labs  Lab 07/25/18 2309 07/26/18 0830  GLUCAP 95 101*   Lipid Profile: No results for input(s): CHOL, HDL, LDLCALC, TRIG, CHOLHDL, LDLDIRECT in the last 72 hours. Thyroid Function Tests: No results for input(s): TSH, T4TOTAL, FREET4, T3FREE, THYROIDAB in the last 72 hours. Anemia Panel: No results for input(s): VITAMINB12, FOLATE, FERRITIN, TIBC, IRON, RETICCTPCT in the last 72 hours. Sepsis Labs: No results for input(s): PROCALCITON, LATICACIDVEN in the last 168 hours.  No results found for this or any previous visit (from the past 240 hour(s)).    Radiology Studies: Dg Abd 1 View  Result Date: 08/01/2018 CLINICAL DATA:  Pain. EXAM: ABDOMEN - 1 VIEW COMPARISON:  None. FINDINGS: No free air, portal venous gas, or pneumatosis identified. No evidence of bowel obstruction. No cause for the patient's  pain identified. IMPRESSION: No cause for pain identified. Electronically Signed   By: Dorise Bullion III M.D   On: 08/01/2018 09:02   Dg Chest Port 1 View  Result Date: 08/01/2018 CLINICAL DATA:  Shortness of breath and upper abdominal pain. EXAM: PORTABLE CHEST 1 VIEW COMPARISON:  July 30, 2018 FINDINGS: The ET and NG tubes have been removed. The right PICC line terminates in the central SVC. Small bilateral pleural effusions are identified with underlying atelectasis. Increased interstitial opacities are seen in the lungs, right greater than left. No other acute abnormalities identified. IMPRESSION: 1. Support apparatus as above. 2. Small bilateral pleural effusions with underlying atelectasis. 3. Mild pulmonary edema. Electronically Signed  By: Dorise Bullion III M.D   On: 08/01/2018 09:01   Dg Swallowing Func-speech Pathology  Result Date: 07/31/2018 Objective Swallowing Evaluation: Type of Study: MBS-Modified Barium Swallow Study  Patient Details Name: Lark Langenfeld MRN: [id number] Date of Birth: 28-May-1950 Today's Date: 07/31/2018 Time: SLP Start Time (ACUTE ONLY): 0623 -SLP Stop Time (ACUTE ONLY): 1408 SLP Time Calculation (min) (ACUTE ONLY): 13 min Past Medical History: Past Medical History: Diagnosis Date . Acute MI ([place])   x4, code blue x3 . CAD (coronary artery disease) 2002  Inf STEMI-2002. 2003-cutting balloon + brachytherapy for restenosis; subsequent acute stent thrombosis 06/2010 requiring 2 separate interventions (Zeta stent, then repeat cath with thrombectomy). focal basal inf AK, nl EF; 03/2011: Patent stents, minor nonobst  residual dz, nl EF; neg stress nuclear in 2008 and stress echo in 2009 . Chronic anticoagulation   Warfarin plus ticagrelor . Chronic respiratory failure ([place]) 08/27/2013  On 2L 02 . COPD (chronic obstructive pulmonary disease) ([place])   02 dependent . Factor 5 Leiden mutation, heterozygous ([place])  . Factor V Leiden, prothrombin gene mutation ([place]) 2006 . Hyperlipidemia   . Hypothyroidism  . Noncompliance  . Pelvic fracture ([place]) 2009 . Pulmonary embolism ([place]) 2006  Associated with deep vein thrombosis-2006; + factor V Leiden . Tobacco abuse   50 pack years Past Surgical History: Past Surgical History: Procedure Laterality Date . COLONOSCOPY  Approximately 2000  Negative screening study . COLONOSCOPY WITH PROPOFOL N/A 05/28/2018  Procedure: COLONOSCOPY WITH PROPOFOL;  Surgeon: Rogene Houston, MD;  Location: [facility];  Service: Endoscopy;  Laterality: N/A; . CORONARY ANGIOPLASTY  2002, 2003, 2012 . ESOPHAGOGASTRODUODENOSCOPY (EGD) WITH PROPOFOL N/A 05/26/2018  Procedure: ESOPHAGOGASTRODUODENOSCOPY (EGD) WITH PROPOFOL;  Surgeon: Rogene Houston, MD;  Location: [facility];  Service: Endoscopy;  Laterality: N/A; . LEFT AND RIGHT HEART CATHETERIZATION WITH CORONARY ANGIOGRAM N/A 04/03/2011  Procedure: LEFT AND RIGHT HEART CATHETERIZATION WITH CORONARY ANGIOGRAM;  Surgeon: Sherren Mocha, MD;  Location: [facility] CATH LAB;  Service: Cardiovascular;  Laterality: N/A; . POLYPECTOMY  05/28/2018  Procedure: POLYPECTOMY;  Surgeon: Rogene Houston, MD;  Location: [facility];  Service: Endoscopy;;  cold snare and biopsy forcep HPI: On 7/16 pt was intubated for a third time with extubation on 7/21.  Mucous plugging removed with bronchoscopy. Pt has had CPR twice during this admission. RN reports improving cough and vocal quality from 7/21 PM to 7/22 AM.  Subjective: Pt awake, alert, pleasant, participative. Assessment / Plan / Recommendation CHL IP CLINICAL IMPRESSIONS 07/31/2018 Clinical Impression Pt presents with mild oropharyngeal dysphagia c/b delayed swallow initiation, decreased hyolaryngeal elevation and excursion, reduced laryngeal closure, and diminished sensation.  These deficits resulted in trace, silent penetration of thin liquid above the vocal folds.  Cued cough was beneficial to clear penetration, but was extremely uncomfortable for pt given hx of chest compressions x2.  Chin  tuck did not prevent penetration.  Cup presentation did not prevent penetration.  Alternating liquid with heavier bolus did not clear penetration.  There was no penetration of puree, regular solid texture, or nectar thick liquid by cup or straw.  During pill simulation, there was brief vallecular stasis of tablet which cleared with spontaneous subsequent swallow.  Recommend regular texture diet with nectar thick liquids. SLP Visit Diagnosis Dysphagia, oropharyngeal phase (R13.12) Attention and concentration deficit following -- Frontal lobe and executive function deficit following -- Impact on safety and function --   CHL IP TREATMENT RECOMMENDATION 07/31/2018 Treatment Recommendations Therapy as outlined in treatment plan  below   Prognosis 07/31/2018 Prognosis for Safe Diet Advancement Good Barriers to Reach Goals -- Barriers/Prognosis Comment -- CHL IP DIET RECOMMENDATION 07/31/2018 SLP Diet Recommendations Regular solids;Nectar thick liquid Liquid Administration via Cup;Straw Medication Administration Whole meds with liquid Compensations Slow rate;Small sips/bites Postural Changes Remain semi-upright after after feeds/meals (Comment)   CHL IP OTHER RECOMMENDATIONS 03/02/2017 Recommended Consults -- Oral Care Recommendations Oral care BID;Staff/trained caregiver to provide oral care Other Recommendations Clarify dietary restrictions   CHL IP FOLLOW UP RECOMMENDATIONS 07/25/2018 Follow up Recommendations None   CHL IP FREQUENCY AND DURATION 07/31/2018 Speech Therapy Frequency (ACUTE ONLY) min 2x/week Treatment Duration 1 week      CHL IP ORAL PHASE 07/31/2018 Oral Phase WFL Oral - Pudding Teaspoon -- Oral - Pudding Cup -- Oral - Honey Teaspoon -- Oral - Honey Cup -- Oral - Nectar Teaspoon -- Oral - Nectar Cup WFL Oral - Nectar Straw WFL Oral - Thin Teaspoon -- Oral - Thin Cup WFL Oral - Thin Straw WFL Oral - Puree WFL Oral - Mech Soft -- Oral - Regular WFL Oral - Multi-Consistency -- Oral - Pill WFL Oral Phase - Comment  --  CHL IP PHARYNGEAL PHASE 07/31/2018 Pharyngeal Phase Impaired Pharyngeal- Pudding Teaspoon -- Pharyngeal -- Pharyngeal- Pudding Cup -- Pharyngeal -- Pharyngeal- Honey Teaspoon -- Pharyngeal -- Pharyngeal- Honey Cup -- Pharyngeal -- Pharyngeal- Nectar Teaspoon -- Pharyngeal -- Pharyngeal- Nectar Cup [facility] Pharyngeal Material does not enter airway Pharyngeal- Nectar Straw WFL Pharyngeal Material does not enter airway Pharyngeal- Thin Teaspoon -- Pharyngeal -- Pharyngeal- Thin Cup Reduced airway/laryngeal closure;Delayed swallow initiation-vallecula Pharyngeal Material enters airway, remains ABOVE vocal cords and not ejected out Pharyngeal- Thin Straw Reduced airway/laryngeal closure;Delayed swallow initiation-vallecula Pharyngeal Material enters airway, remains ABOVE vocal cords and not ejected out Pharyngeal- Puree Delayed swallow initiation-vallecula Pharyngeal Material does not enter airway Pharyngeal- Mechanical Soft -- Pharyngeal -- Pharyngeal- Regular Delayed swallow initiation-vallecula Pharyngeal Material does not enter airway Pharyngeal- Multi-consistency -- Pharyngeal -- Pharyngeal- Pill Pharyngeal residue - valleculae Pharyngeal Material does not enter airway Pharyngeal Comment --  CHL IP CERVICAL ESOPHAGEAL PHASE 07/31/2018 Cervical Esophageal Phase WFL Pudding Teaspoon -- Pudding Cup -- Honey Teaspoon -- Honey Cup -- Nectar Teaspoon -- Nectar Cup -- Nectar Straw -- Thin Teaspoon -- Thin Cup -- Thin Straw -- Puree -- Mechanical Soft -- Regular -- Multi-consistency -- Pill -- Cervical Esophageal Comment -- Celedonio Savage, MA, CCC-SLP Acute Rehabilitation Services Office: [phone] 07/31/2018, 3:10 PM               Scheduled Meds: . albuterol  2.5 mg Nebulization TID  . arformoterol  15 mcg Nebulization BID  . budesonide (PULMICORT) nebulizer solution  0.5 mg Nebulization BID  . buPROPion  50 mg Oral BID  . Chlorhexidine Gluconate Cloth  6 each Topical Daily  . citalopram  10 mg Oral Daily  .  glycopyrrolate  0.2 mg Intravenous BID  . guaiFENesin  600 mg Oral BID  . [START ON 08/02/2018] levothyroxine  125 mcg Oral Q0600  . lidocaine  1 patch Transdermal Q24H  . mouth rinse  15 mL Mouth Rinse BID  . pantoprazole  40 mg Oral BID  . sodium chloride flush  10-40 mL Intracatheter Q12H  . ticagrelor  60 mg Oral BID   Continuous Infusions: . sodium chloride 10 mL/hr at 07/31/18 1800  . heparin 1,100 Units/hr (08/01/18 0935)  . sulfamethoxazole-trimethoprim 320 mg of trimethoprim (08/01/18 0543)     LOS: 15 days   Assessment &  Plan:   Active Problems:   Respiratory failure requiring intubation (HCC)   GI bleed   UGI bleed   Cardiac arrest (HCC)   Endobronchial mass   Acute on chronic hypoxic respiratory failure/severe left atelectasis/collapse: Patient has had 2 intubations followed by extubations and 2 bronchoscopies so far during this complicated hospitalization.  Her sputum culture were positive for stenotrophomonas maltophilia  and she has been on Bactrim for that.  PCCM signed off and patient has been transferred to [facility] starting 08/01/2018.  Currently on 3 L of nasal cannula oxygen.  CAD, a new wall motion abnormality on TTE 07/26/2018/now with chest pain: Patient was seen by cardiology at that point in time but she was too sick to handle any sort of intervention so cardiology signed off.  Now she has chest pain which is pressure-like since last night.  I ordered stat chest x-ray to look for lung pathology but that is a stable compared to yesterday with only minimal increase in left pleural effusion.  I will order serial cardiac enzymes at least x3 and EKG.  Status post cardiac arrest: Doing well now.  Paroxysmal atrial fibrillation: She is in sinus rhythm now.  Continue aspirin and Brilinta.  She was on Coumadin but she had elevated INR so this was reversed and her INR has been subtherapeutic and she has been bridged on heparin to be dosed by pharmacy.  Dysphagia: SLP on  board.  Currently on heart healthy diet.  History of hematemesis secondary to upper GI bleed: She is on warfarin however has subtherapeutic INR so she is on heparin bridging dose.  Remains on PPI.  Daily CBC.  Hemoglobin is stable.  Outpatient follow-up with GI.   DVT prophylaxis: Heparin drip Code Status: Full code Family Communication: None present at bedside. Disposition Plan: To be determined.   Time spent: 40 min    Darliss Cheney, MD [facility] Pager [phone]  If 7PM-7AM, please contact night-coverage [email or web address] Password [facility] 08/01/2018, 10:13 AM

## 2018-08-01 NOTE — Plan of Care (Signed)
  Problem: Education: Goal: Knowledge of General Education information will improve Description: Including pain rating scale, medication(s)/side effects and non-pharmacologic comfort measures Outcome: Progressing   Problem: Health Behavior/Discharge Planning: Goal: Ability to manage health-related needs will improve Outcome: Progressing   Problem: Clinical Measurements: Goal: Ability to maintain clinical measurements within normal limits will improve Outcome: Progressing Goal: Will remain free from infection Outcome: Progressing Goal: Diagnostic test results will improve Outcome: Progressing Goal: Respiratory complications will improve Outcome: Progressing Goal: Cardiovascular complication will be avoided Outcome: Progressing   Problem: Activity: Goal: Risk for activity intolerance will decrease Outcome: Progressing   Problem: Nutrition: Goal: Adequate nutrition will be maintained Outcome: Progressing   Problem: Coping: Goal: Level of anxiety will decrease Outcome: Progressing   Problem: Elimination: Goal: Will not experience complications related to bowel motility Outcome: Progressing Goal: Will not experience complications related to urinary retention Outcome: Progressing   Problem: Pain Managment: Goal: General experience of comfort will improve Outcome: Not Progressing   Problem: Safety: Goal: Ability to remain free from injury will improve Outcome: Progressing   Problem: Skin Integrity: Goal: Risk for impaired skin integrity will decrease Outcome: Not Progressing   Problem: Activity: Goal: Ability to tolerate increased activity will improve Outcome: Not Progressing   Problem: Respiratory: Goal: Ability to maintain a clear airway and adequate ventilation will improve Outcome: Progressing

## 2018-08-01 NOTE — Plan of Care (Signed)
°  Problem: Clinical Measurements: °Goal: Will remain free from infection °Outcome: Progressing °Goal: Respiratory complications will improve °Outcome: Progressing °  °
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
## 2018-08-01 NOTE — Progress Notes (Signed)
  Speech Language Pathology Treatment: Dysphagia  Patient Details Name: [name] MRN: [id number] DOB: 1950/08/20 Today's Date: 08/01/2018 Time: 9295-7473 SLP Time Calculation (min) (ACUTE ONLY): 18 min  Assessment / Plan / Recommendation Clinical Impression  Patient is refusing to eat and drink at this time. She states she will drink, but not the nectar thickened liquids. Patient re-education of MBS results and risk of aspiration, PNA and decline in medical status.  Swallowing exercises were written out and demonstrated for patient. She made one attempt to do a "tongue hold swallow" and said she can't and refused any further attempts. If patient continues to refuse nectar thickened liquids, consider changing liquids to thin with patient education on aspiration risk.    HPI HPI: On 7/16 pt was intubated for a third time with extubation on 7/21.  Mucous plugging removed with bronchoscopy. Pt has had CPR twice during this admission. RN reports improving cough and vocal quality from 7/21 PM to 7/22 AM.      SLP Plan  Continue with current plan of care       Recommendations  Diet recommendations: Regular;Nectar-thick liquid Liquids provided via: Cup Medication Administration: Whole meds with liquid Compensations: Slow rate;Small sips/bites Postural Changes and/or Swallow Maneuvers: Seated upright 90 degrees                SLP Visit Diagnosis: Dysphagia, oropharyngeal phase (R13.12) Plan: Continue with current plan of care       [place], MA, CCC-SLP 08/01/2018 9:42 AM

## 2018-08-01 NOTE — Progress Notes (Signed)
ANTICOAGULATION CONSULT NOTE - Follow Up Consult  Pharmacy Consult for Heparin   Indication: atrial fibrillation  Patient Measurements: Height: 5\' 4"  (162.6 cm) Weight: 135 lb 6.4 oz (61.4 kg) IBW/kg (Calculated) : 54.7  Vital Signs: BP: 120/50 (07/23 0420) Pulse Rate: 98 (07/23 0420)  Labs: Recent Labs    07/30/18 0423  07/30/18 0636 07/30/18 1450 07/31/18 0434 08/01/18 0425  HGB 7.3*  --   --   --  7.4* 7.9*  HCT 23.6*  --   --   --  24.8* 25.4*  PLT 204  --   --   --  247 291  LABPROT 16.0*  --   --   --  15.3* 13.6  INR 1.3*  --   --   --  1.2 1.1  HEPARINUNFRC <0.10*  --   --  0.19* 0.26* 0.24*  CREATININE  --    < > 0.89  --  0.91 1.00   < > = values in this interval not displayed.    Estimated Creatinine Clearance: 46.5 mL/min (by C-G formula based on SCr of 1 mg/dL).  Assessment: 68 yr old female with hx PE/DVT in setting of  Factor V Leiden on warfarin PTA which was reversed with [place] and Vitamin K on 07/17/18 d/t coagulapathy. No current plans for EGD per GI so CCM ordered anticoagulation to be restarted on 7/13.   Pt continues on IV heparin. Heparin level is slightly above low goal today. No new bleeding noted. CBC is stable.   Goal of Therapy:  HL 0.15 - 0.2 units/mL Monitor platelets by anticoagulation protocol: Yes   Plan:  Decrease heparin to 1100 units/hr Monitor heparin level, CBC  Salome Arnt, PharmD, BCPS Please see AMION for all pharmacy numbers 08/01/2018 8:53 AM

## 2018-08-01 NOTE — Care Management Important Message (Signed)
Important Message  Patient Details  Name: [name] MRN: [id number] Date of Birth: 1950/01/22   Medicare Important Message Given:  Yes     [name] 08/01/2018, 1:42 PM

## 2018-08-02 LAB — CBC
HCT: 24.6 % — ABNORMAL LOW (ref 36.0–46.0)
Hemoglobin: 7.8 g/dL — ABNORMAL LOW (ref 12.0–15.0)
MCH: 31 pg (ref 26.0–34.0)
MCHC: 31.7 g/dL (ref 30.0–36.0)
MCV: 97.6 fL (ref 80.0–100.0)
Platelets: 264 10*3/uL (ref 150–400)
RBC: 2.52 MIL/uL — ABNORMAL LOW (ref 3.87–5.11)
RDW: 14.6 % (ref 11.5–15.5)
WBC: 4 10*3/uL (ref 4.0–10.5)
nRBC: 0 % (ref 0.0–0.2)

## 2018-08-02 LAB — HEPARIN LEVEL (UNFRACTIONATED)
Heparin Unfractionated: 0.75 IU/mL — ABNORMAL HIGH (ref 0.30–0.70)
Heparin Unfractionated: 0.14 IU/mL — ABNORMAL LOW (ref 0.30–0.70)

## 2018-08-02 LAB — BASIC METABOLIC PANEL
Anion gap: 9 (ref 5–15)
BUN: 8 mg/dL (ref 8–23)
CO2: 30 mmol/L (ref 22–32)
Calcium: 9 mg/dL (ref 8.9–10.3)
Chloride: 91 mmol/L — ABNORMAL LOW (ref 98–111)
Creatinine, Ser: 1.04 mg/dL — ABNORMAL HIGH (ref 0.44–1.00)
GFR calc Af Amer: >60 mL/min (ref >60)
GFR calc non Af Amer: 55 mL/min — ABNORMAL LOW (ref >60)
Glucose, Bld: 95 mg/dL (ref 70–99)
Potassium: 5 mmol/L (ref 3.5–5.1)
Sodium: 130 mmol/L — ABNORMAL LOW (ref 135–145)

## 2018-08-02 LAB — PROTIME-INR
INR: 1.1 (ref 0.8–1.2)
Prothrombin Time: 14.4 seconds (ref 11.4–15.2)

## 2018-08-02 LAB — MAGNESIUM: Magnesium: 1.6 mg/dL — ABNORMAL LOW (ref 1.7–2.4)

## 2018-08-02 MED ORDER — WARFARIN - PHARMACIST DOSING INPATIENT
Freq: Every day | Status: DC
  Administered 2018-08-07 – 2018-08-10 (×4)

## 2018-08-02 MED ORDER — WARFARIN SODIUM 2.5 MG PO TABS
2.5000 mg | ORAL_TABLET | Freq: Once | ORAL | Status: AC
  Administered 2018-08-02: 2.5 mg via ORAL
  Filled 2018-08-02: qty 1

## 2018-08-02 NOTE — Progress Notes (Signed)
ANTICOAGULATION CONSULT NOTE - Follow Up Consult  Pharmacy Consult for Heparin   Indication: atrial fibrillation  Patient Measurements: Height: 5\' 4"  (162.6 cm) Weight: 135 lb 6.4 oz (61.4 kg) IBW/kg (Calculated) : 54.7  Vital Signs: Temp: 98.4 F (36.9 C) (07/23 1930) Temp Source: Oral (07/23 1930) BP: 135/61 (07/23 1930) Pulse Rate: 97 (07/23 1930)  Labs: Recent Labs    07/30/18 0636  07/31/18 0434 08/01/18 0425 08/01/18 1050 08/01/18 1500 08/02/18 0453  HGB  --    < > 7.4* 7.9*  --   --  7.8*  HCT  --   --  24.8* 25.4*  --   --  24.6*  PLT  --   --  247 291  --   --  264  LABPROT  --   --  15.3* 13.6  --   --   --   INR  --   --  1.2 1.1  --   --   --   HEPARINUNFRC  --    < > 0.26* 0.24*  --   --  0.75*  CREATININE 0.89  --  0.91 1.00  --   --   --   TROPONINIHS  --   --   --   --  16 18*  --    < > = values in this interval not displayed.    Estimated Creatinine Clearance: 46.5 mL/min (by C-G formula based on SCr of 1 mg/dL).  Assessment: 68 yr old female with hx PE/DVT in setting of  Factor V Leiden on warfarin PTA which was reversed with [place] and Vitamin K on 07/17/18 d/t coagulapathy. No current plans for EGD per GI so CCM ordered anticoagulation to be restarted on 7/13.   Pt continues on IV heparin. Heparin level currently elevated, no S/Sx bleeding per RN.  Goal of Therapy:  HL 0.15 - 0.2 units/mL Monitor platelets by anticoagulation protocol: Yes   Plan:  Decrease heparin to 1000 units/hr Recheck in [place], PharmD, BCPS Clinical Pharmacist Please check AMION for all [place] numbers 08/02/2018

## 2018-08-02 NOTE — Progress Notes (Signed)
OT Cancellation Note  Patient Details Name: [name] MRN: [id number] DOB: 08/29/1950   Cancelled Treatment:    Reason Eval/Treat Not Completed: Pain limiting ability to participate;Other (comment).  Attempted skilled OT treatment.  Pt. Declined stating she is in pain (L side greater than R)  and thinks she "has another clot and needs sx., ive had them enough times in my life to know and while you're at it tell my nurse I need something for pain".  Passed along both messages/requests to her rn.  Will attempt back as pt. Able.    Janice Coffin, COTA/L 08/02/2018, 9:29 AM

## 2018-08-02 NOTE — Progress Notes (Signed)
Palliative:  HPI: 68 y.o. female   admitted on 07/16/2018  from [place], [place].  She has 1 daughter [name] and also has a son (estranged).  She states that she lives by herself.  She has past medical history significant for COPD for which she sees outpatient pulmonary specialist, also has a history of coronary artery disease for which she has followed up with outpatient cardiology.  She has history of hypertension PE and factor V Leiden and is maintained on oral anticoagulation in the outpatient setting.  Patient presented initially to [facility] with large volume hematemesis and underwent a PEA arrest shortly thereafter with second PEA arrest 07/25/18.  She was resuscitated and transferred to [facility].  She has been seen gastroenterologist, pulmonary medicine and critical care medicine.  This has been a prolonged and complicated hospital course.  She has required several episodes of intubations and extubations.  She has been found to have mucous plug and lung collapse for which she required thoracenteses and bronchoscopy.  She had mucous plug causing mainstem bronchus blockage.  She continues to have weakness, ongoing symptoms of such as chest pain, and is not eating well.  Palliative medicine consultation for goals of care discussions has been requested.   I met today with [name]. She is alert and conversant but does have some underlying confusion and even talks of bugs on the wall (this could have been a screw in the wall but she did think it was moving??). She does have some understanding of her situation although she refers to her arrests as "heart attacks." We reviewed all that she has been through in her life but in particular this admission with severe health conditions. She begins to tell me that she does not feel that she would want to go through all of this again. She talks of being tired. She talks of not having anything more left to give. She is open to even hospice options.  She tells me that she feels that she does not have much time left to live. Although part of her conversation is appropriate she is somewhat tangential at times. Emotional support provided.   [name] shares with me that she needs to speak with her daughter, [name], about these decisions. She gives me permission to reach out to [facility] to discuss further. I spoke with [name] and discussed her current status but [name] was not even aware that her mother was extubated. She was not very up to date on current status and was updated to best of my ability. [name] tells me that the last she had heard was "when they were trying to talk me into pulling the plug on my mom." [name] tells me that it was not the right time to do this as there were many other issues going on in their lives with other loss and health issues.   I did explain to [name] my conversation with her mother above and her wishes for DNR. [name] tells me that her mother has been DNR in the past but then tells [name] that she does not want that. [name] says that her mother says and does things to get attention and she is concerned that she may not be sincere and therefore does not at this time support DNR. I gave [name] phone number to unit and her mother's room and encouraged her to speak with her. We did discuss family meeting to discuss [place] on Monday 08/05/18. [name] is not available this weekend and I am not available  again until Monday as well. All questions/concerns addressed. Emotional support provided.   Exam: Alert, mostly oriented although moments of confusion and possible visual hallucinations. Central chest pain tender to touch (likely s/t compressions). No distress. Thin, frail. Breathing regular and unlabored.   Plan: - Contradictory wishes expressed by patient and family. Patient wishes involvement from daughter but daughter has not been available to participate in discussions or up to date on status.  -  Reassess and attempt for family meeting Monday 08/05/18 if daughter [name] able and willing. I will reach out to her Monday. She is not available until Monday.    [id number], [phone] 75 min  Vinie Sill, NP Palliative Medicine Team Pager [phone] (Please see [email or web address] for schedule) Team Phone [phone]    Greater than 50%  of this time was spent counseling and coordinating care related to the above assessment and plan.   The above conversation was completed via telephone due to the visitor restrictions during the COVID-19 pandemic. Thorough chart review and discussion with necessary members of the care team was completed as part of assessment.

## 2018-08-02 NOTE — Progress Notes (Signed)
PT Cancellation Note  Patient Details Name: [name] MRN: [id number] DOB: April 11, 1950   Cancelled Treatment:    Reason Eval/Treat Not Completed: Medical issues which prohibited therapy patient with HR 127 at rest and c/o chest pain (feels like having another heart attack).  RN made aware.  Will attempt another day.   Reginia Naas 08/02/2018, 12:11 PM  Magda Kiel, [place] [phone] 08/02/2018

## 2018-08-02 NOTE — Progress Notes (Signed)
PROGRESS NOTE    [name]  HFW:[id number] DOB: 09-07-1950 DOA: 23-Jul-2018 PCP: [name], NP   Brief Narrative:  68 yr old withCOPD, MI, HTN, and PE,Factor V Leiden onWarfarin(also on Brillinta)presented with large volumehematemesison late hrs of 07/23/18. Coded in [facility] ED for [place]. Coagulopathy reversed with Vit K, [place].Transferred to [facility] on PPI. Received transfusions and GI consulted for EGD ( wanted pt to be off AC for 5 days).Extubatedon 7/9and transferred out to Internal Medicine service.Back to ICU 7/11 for mucus plug and left lung collapses/p thoracentesis was re-intubated and has a bronchoscopy on 7/11/ showed the whole left mainstem bronchus was occluded by mucous plug and thick secretions which extended all the way into the lower lobe. It was suctioned out and BAL was sent which was positive for Stenotrophomonas . Repeat Bronchosopy on 7/13 prior to extubation was negative. Extubated successfully on 7/13 and downgraded to Internal Medicine.  PCCM re-engaged 7/16 for increasing oxygen demand. Clinically appears to be pulm edema.Plan fordiuresisand aim to support, hopefully avoid intubation. Evening of 7/16 experienced PEA arrest. Got epi, atropine, and dopamine. Intubated and transferred to the ICU for further evaluation and management. Repeat bronchoscopy 7/17.  And transfer back to [facility] on 08/01/2018  Consultants:   None  Procedures:   Intubated 2018/07/23 and extubated 07/18/2018  Left-sided thoracentesis on 07/20/2018  Bronchoscopy on 07/20/2018  Reintubated on 07/20/2018 and extubated on 07/22/2018  Cardiac arrest on 07/25/2018.  Repeat bronchoscopy on 07/26/2018  Antimicrobials:   Bactrim DS   Subjective: Patient seen and examined.  She feels better today.  Did not complain of any shortness of breath or chest pain.  She made with the palliative care yesterday.  She is agreeable with going to SNF.  Objective: Vitals:   08/02/18 0600 08/02/18 0841 08/02/18 0842 08/02/18 1403  BP:  (!) 114/55  114/76  Pulse:  (!) 102 99 (!) 104  Resp:  _0 Temp:  99 F (37.2 C)    TempSrc:  Oral    SpO2:  91% 98% 98%  Weight: 59.6 kg     Height:        Intake/Output Summary (Last 24 hours) at 08/02/2018 1441 Last data filed at 08/02/2018 0400 Gross per 24 hour  Intake 10 ml  Output 1225 ml  Net -1215 ml   Filed Weights   07/31/18 0500 08/01/18 0420 08/02/18 0600  Weight: 61 kg 61.4 kg 59.6 kg    Examination:  General exam: Appears calm and comfortable  Respiratory system: Diminished breath sounds due to poor inspiratory effort. Respiratory effort normal. Cardiovascular system: S1 & S2 heard, RRR. No JVD, murmurs, rubs, gallops or clicks. No pedal edema. Gastrointestinal system: Abdomen is nondistended, soft and nontender. No organomegaly or masses felt. Normal bowel sounds heard. Central nervous system: Alert and oriented. No focal neurological deficits. Extremities: Symmetric 5 x 5 power. Skin: No rashes, lesions or ulcers Psychiatry: Judgement and insight appear normal. Mood & affect appropriate.    Data Reviewed: I have personally reviewed following labs and imaging studies  CBC: Recent Labs  Lab 07/29/18 0459 07/29/18 0807 07/30/18 0423 07/31/18 0434 08/01/18 0425 08/02/18 0453  WBC 8.4  --  7.2 5.8 5.1 4.0  HGB 7.6* 6.5* 7.3* 7.4* 7.9* 7.8*  HCT 23.7* 19.0* 23.6* 24.8* 25.4* 24.6*  MCV 93.7  --  95.9 100.4* 96.9 97.6  PLT 182  --  204 247 291 027   Basic Metabolic Panel: Recent Labs  Lab 07/27/18 0239  07/28/18 0350 07/29/18 0459  07/30/18 0449 07/30/18 0636 07/31/18 0434 08/01/18 0425 08/02/18 1125  NA 136 135 135   < > 134* 131* 133* 132* 130*  K 4.0 4.3 4.7   < > 5.2* 4.9 5.4* 5.3* 5.0  CL 86* 95* 101  --  100 99 97* 92* 91*  CO2 39* 31 25  --  _0 GLUCOSE 114* 107* 106*  --  100* 123* 81 84 95  BUN _1 --  _2 CREATININE 1.37* 1.07* 0.94  --   0.87 0.89 0.91 1.00 1.04*  CALCIUM 7.8* 8.0* 8.0*  --  8.2* 8.1* 8.7* 9.0 9.0  MG 2.1 1.9 1.9  --  1.7  --  1.6* 1.7 1.6*  PHOS 4.0 3.3 3.3  --   --   --  4.8* 4.3  --    < > = values in this interval not displayed.   GFR: Estimated Creatinine Clearance: 44.7 mL/min (A) (by C-G formula based on SCr of 1.04 mg/dL (H)). Liver Function Tests: Recent Labs  Lab 07/27/18 0239 07/28/18 0350 07/29/18 0459  ALBUMIN 2.3* 2.2* 2.1*   No results for input(s): LIPASE, AMYLASE in the last 168 hours. No results for input(s): AMMONIA in the last 168 hours. Coagulation Profile: Recent Labs  Lab 07/29/18 0459 07/30/18 0423 07/31/18 0434 08/01/18 0425 08/02/18 0453  INR 1.4* 1.3* 1.2 1.1 1.1   Cardiac Enzymes: No results for input(s): CKTOTAL, CKMB, CKMBINDEX, TROPONINI in the last 168 hours. BNP (last 3 results) No results for input(s): PROBNP in the last 8760 hours. HbA1C: No results for input(s): HGBA1C in the last 72 hours. CBG: No results for input(s): GLUCAP in the last 168 hours. Lipid Profile: No results for input(s): CHOL, HDL, LDLCALC, TRIG, CHOLHDL, LDLDIRECT in the last 72 hours. Thyroid Function Tests: No results for input(s): TSH, T4TOTAL, FREET4, T3FREE, THYROIDAB in the last 72 hours. Anemia Panel: No results for input(s): VITAMINB12, FOLATE, FERRITIN, TIBC, IRON, RETICCTPCT in the last 72 hours. Sepsis Labs: Recent Labs  Lab 08/01/18 1050  PROCALCITON 0.14    No results found for this or any previous visit (from the past 240 hour(s)).    Radiology Studies: Dg Abd 1 View  Result Date: 08/01/2018 CLINICAL DATA:  Pain. EXAM: ABDOMEN - 1 VIEW COMPARISON:  None. FINDINGS: No free air, portal venous gas, or pneumatosis identified. No evidence of bowel obstruction. No cause for the patient's pain identified. IMPRESSION: No cause for pain identified. Electronically Signed   By: Dorise Bullion III M.D   On: 08/01/2018 09:02   Dg Chest Port 1 View  Result Date:  08/01/2018 CLINICAL DATA:  Shortness of breath and upper abdominal pain. EXAM: PORTABLE CHEST 1 VIEW COMPARISON:  July 30, 2018 FINDINGS: The ET and NG tubes have been removed. The right PICC line terminates in the central SVC. Small bilateral pleural effusions are identified with underlying atelectasis. Increased interstitial opacities are seen in the lungs, right greater than left. No other acute abnormalities identified. IMPRESSION: 1. Support apparatus as above. 2. Small bilateral pleural effusions with underlying atelectasis. 3. Mild pulmonary edema. Electronically Signed   By: Dorise Bullion III M.D   On: 08/01/2018 09:01    Scheduled Meds:  albuterol  2.5 mg Nebulization TID   arformoterol  15 mcg Nebulization BID   budesonide (PULMICORT) nebulizer solution  0.5 mg Nebulization BID   buPROPion  50 mg Oral BID  Chlorhexidine Gluconate Cloth  6 each Topical Daily   citalopram  10 mg Oral Daily   glycopyrrolate  0.2 mg Intravenous BID   guaiFENesin  600 mg Oral BID   levothyroxine  125 mcg Oral Q0600   lidocaine  1 patch Transdermal Q24H   mouth rinse  15 mL Mouth Rinse BID   pantoprazole  40 mg Oral BID   sodium chloride flush  10-40 mL Intracatheter Q12H   ticagrelor  60 mg Oral BID   warfarin  2.5 mg Oral ONCE-1800   Warfarin - Pharmacist Dosing Inpatient   Does not apply q1800   Continuous Infusions:  sodium chloride 10 mL/hr at 07/31/18 1800   heparin 1,000 Units/hr (08/02/18 0534)     LOS: 16 days   Assessment & Plan:   Active Problems:   Respiratory failure requiring intubation (HCC)   GI bleed   UGI bleed   Cardiac arrest (HCC)   Endobronchial mass   Acute on chronic hypoxic respiratory failure/severe left atelectasis/collapse: Patient has had 2 intubations followed by extubations and 2 bronchoscopies so far during this complicated hospitalization.  Her sputum culture were positive for stenotrophomonas maltophilia  and she has been on Bactrim for  that.  PCCM signed off and patient has been transferred to [facility] starting 08/01/2018.  Currently on 3 L of nasal cannula oxygen which is her baseline.  CAD, a new wall motion abnormality on TTE 07/26/2018/now with chest pain: Patient was seen by cardiology at some point in time during this hospitalization but she was too sick to handle any sort of intervention so cardiology signed off.  She had chest pain on 08/01/2018.  EKG was done which showed no ST-T wave changes.  Cardiac enzymes were negative.  Her chest pain has resolved.  Status post cardiac arrest: Doing well now.  Paroxysmal atrial fibrillation: She is in sinus rhythm now.  Continue aspirin and Brilinta.  She was on Coumadin but she had elevated INR so this was reversed and she has been on heparin.  Consulted pharmacy to resume Coumadin.  Dysphagia: SLP on board.  Currently on heart healthy diet.  History of hematemesis secondary to upper GI bleed: She is on warfarin however has subtherapeutic INR so she is on heparin bridging dose.  Remains on PPI.  Daily CBC.  Hemoglobin is stable.  Outpatient follow-up with GI.  Hypomagnesemia: We will replace with magnesium sulfate.  Hyponatremia: 130.  Her baseline is around 132.  Continue to watch.   DVT prophylaxis: Heparin drip Code Status: Full code Family Communication: None present at bedside. Disposition Plan: PT recommends discharging to SNF.  She seems to be medically stabilized now.  Discussed with case manager, working on placement.  Hoping for discharge tomorrow.   Time spent: 28 min    Darliss Cheney, MD [facility] Pager [phone]  If 7PM-7AM, please contact night-coverage [email or web address] Password [facility] 08/02/2018, 2:41 PM
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
## 2018-08-02 NOTE — Progress Notes (Signed)
ANTICOAGULATION CONSULT NOTE - Follow Up Consult  Pharmacy Consult for Heparin   Indication: atrial fibrillation  Patient Measurements: Height: 5\' 4"  (162.6 cm) Weight: 131 lb 6.3 oz (59.6 kg) IBW/kg (Calculated) : 54.7  Vital Signs: Temp: 99 F (37.2 C) (07/24 0841) Temp Source: Oral (07/24 0841) BP: 114/55 (07/24 0841) Pulse Rate: 99 (07/24 0842)  Labs: Recent Labs    07/31/18 0434 08/01/18 0425 08/01/18 1050 08/01/18 1500 08/02/18 0453 08/02/18 1125  HGB 7.4* 7.9*  --   --  7.8*  --   HCT 24.8* 25.4*  --   --  24.6*  --   PLT 247 291  --   --  264  --   LABPROT 15.3* 13.6  --   --  14.4  --   INR 1.2 1.1  --   --  1.1  --   HEPARINUNFRC 0.26* 0.24*  --   --  0.75* 0.14*  CREATININE 0.91 1.00  --   --   --   --   TROPONINIHS  --   --  16 18*  --   --     Estimated Creatinine Clearance: 46.5 mL/min (by C-G formula based on SCr of 1 mg/dL).  Assessment: 68 yr old female with hx PE/DVT in setting of  Factor V Leiden on warfarin PTA which was reversed with [place] and Vitamin K on 07/17/18 d/t coagulapathy. No current plans for EGD per GI so CCM ordered heparin to start on 7/13. Now restarting warfarin with heparin bridge.  Warfarin regimen PTA: 5 mg MWFSun, 6 mg TTSat (1 mg tablet).   Heparin level (0.14) slightly subtherapeutic after recent drip rate decrease to 1000 units/hr. CBC stable, no bleeding noted. Will be conservative on warfarin dosing given recent bleeding.  Goal of Therapy:  HL 0.15 - 0.2 units/mL Monitor platelets by anticoagulation protocol: Yes   Plan:  Increase heparin IV to 1050 units/hr Warfarin 2.5 mg PO x1 tonight Daily heparin level, CBC, and INR  Richardine Service, PharmD PGY1 Pharmacy Resident Phone: [phone] 08/02/2018  1:41 PM  Please check [email or web address] for unit-specific pharmacy phone numbers.

## 2018-08-03 DIAGNOSIS — J9690 Respiratory failure, unspecified, unspecified whether with hypoxia or hypercapnia: Secondary | ICD-10-CM

## 2018-08-03 LAB — CBC
HCT: 23.7 % — ABNORMAL LOW (ref 36.0–46.0)
Hemoglobin: 7.6 g/dL — ABNORMAL LOW (ref 12.0–15.0)
MCH: 30.2 pg (ref 26.0–34.0)
MCHC: 32.1 g/dL (ref 30.0–36.0)
MCV: 94 fL (ref 80.0–100.0)
Platelets: 253 10*3/uL (ref 150–400)
RBC: 2.52 MIL/uL — ABNORMAL LOW (ref 3.87–5.11)
RDW: 14.4 % (ref 11.5–15.5)
WBC: 4.2 10*3/uL (ref 4.0–10.5)
nRBC: 0 % (ref 0.0–0.2)

## 2018-08-03 LAB — MAGNESIUM: Magnesium: 1.7 mg/dL (ref 1.7–2.4)

## 2018-08-03 LAB — HEPARIN LEVEL (UNFRACTIONATED)
Heparin Unfractionated: 0.99 IU/mL — ABNORMAL HIGH (ref 0.30–0.70)
Heparin Unfractionated: 0.19 IU/mL — ABNORMAL LOW (ref 0.30–0.70)
Heparin Unfractionated: 0.3 IU/mL (ref 0.30–0.70)
Heparin Unfractionated: 0.36 IU/mL (ref 0.30–0.70)

## 2018-08-03 LAB — BASIC METABOLIC PANEL
Anion gap: 10 (ref 5–15)
BUN: 8 mg/dL (ref 8–23)
CO2: 30 mmol/L (ref 22–32)
Calcium: 8.8 mg/dL — ABNORMAL LOW (ref 8.9–10.3)
Chloride: 90 mmol/L — ABNORMAL LOW (ref 98–111)
Creatinine, Ser: 1.15 mg/dL — ABNORMAL HIGH (ref 0.44–1.00)
GFR calc Af Amer: 57 mL/min — ABNORMAL LOW (ref >60)
GFR calc non Af Amer: 49 mL/min — ABNORMAL LOW (ref >60)
Glucose, Bld: 88 mg/dL (ref 70–99)
Potassium: 4.8 mmol/L (ref 3.5–5.1)
Sodium: 130 mmol/L — ABNORMAL LOW (ref 135–145)

## 2018-08-03 LAB — PROTIME-INR
INR: 1.2 (ref 0.8–1.2)
Prothrombin Time: 14.9 seconds (ref 11.4–15.2)

## 2018-08-03 MED ORDER — POLYETHYLENE GLYCOL 3350 17 GM/SCOOP PO POWD
1.0000 | Freq: Once | ORAL | Status: AC
  Administered 2018-08-03: 255 g via ORAL
  Filled 2018-08-03: qty 255

## 2018-08-03 NOTE — Progress Notes (Signed)
Patient is having bigeminy and trigeminy rhythms with high PVCs, this is new for the patient.  I have paged [facility] and notify MD about the change.  I will keep monitoring patient.

## 2018-08-03 NOTE — Progress Notes (Signed)
PROGRESS NOTE    [name]  KCL:[id number] DOB: [birth year] DOA: 08-08-18 PCP: [name], NP   Brief Narrative:  68 yr old withCOPD, MI, HTN, and PE,Factor V Leiden onWarfarin(also on Brillinta)presented with large volumehematemesison late hrs of 2018/08/08. Coded in [facility] ED for [place]. Coagulopathy reversed with Vit K, [place].[facility]red to [facility] [place] [facility]. Received transfusions and GI consulted for EGD ( wanted pt to be off AC for 5 days).Extubatedon 7/9and transferred out to Internal Medicine service.Back to ICU 7/11 for mucus plug and left lung collapses/p thoracentesis was re-intubated and has a bronchoscopy on 7/11/ showed the whole left mainstem bronchus was occluded by mucous plug and thick secretions which extended all the way into the lower lobe. It was suctioned out and BAL was sent which was positive for Stenotrophomonas . Repeat Bronchosopy on 7/13 prior to extubation was negative. Extubated successfully on 7/13 and downgraded to Internal Medicine.  PCCM re-engaged 7/16 for increasing oxygen demand. Clinically appears to be pulm edema.Plan fordiuresisand aim to support, hopefully avoid intubation. Evening of 7/16 experienced PEA arrest. Got epi, atropine, and dopamine. Intubated and transferred to the ICU for further evaluation and management. Repeat bronchoscopy 7/17.  And transfer back to [facility] on 08/01/2018  Consultants:   None  Procedures:   Intubated 08/08/2018 and extubated 07/18/2018  Left-sided thoracentesis on 07/20/2018  Bronchoscopy on 07/20/2018  Reintubated on 07/20/2018 and extubated on 07/22/2018  Cardiac arrest on 07/25/2018.  Repeat bronchoscopy on 07/26/2018  Antimicrobials:   Bactrim DS   Subjective: Patient seen and examined.  She has no complaints.  Looks much better and comfortable today.  She tells me that she talked to the palliative care yesterday and that she told them that she wants to be DNR and wants to pursue  hospice however according to the note documented by palliative care nurse practitioner, they talk to patient's daughter who was against DNR and since there was no conclusion made so no CODE STATUS was changed and in fact they are working on arranging a meeting with the family for final decisions.  Patient herself seems to be very competent to me.  When I asked her if she had talked to her daughter about her DNR, she said yes.  When I asked her if she was agreeable to DNR status and she said " I do not have to ask her, I just had to tell her, it is my decision not hers".   Objective: Vitals:   08/03/18 0835 08/03/18 0859 08/03/18 1316 08/03/18 1323  BP:  (!) 111/52 122/61   Pulse:  90 93   Resp:  14    Temp:  98.4 F (36.9 C)    TempSrc:  Oral    SpO2: 93% 99% 99% 92%  Weight:      Height:        Intake/Output Summary (Last 24 hours) at 08/03/2018 1440 Last data filed at 08/03/2018 0900 Gross per 24 hour  Intake 310 ml  Output 400 ml  Net -90 ml   Filed Weights   08/01/18 0420 08/02/18 0600 08/03/18 0241  Weight: 61.4 kg 59.6 kg 60.2 kg    Examination:  General exam: Appears calm and comfortable  Respiratory system: Diminished breath sounds bilaterally with poor inspiratory effort Cardiovascular system: S1 & S2 heard, RRR. No JVD, murmurs, rubs, gallops or clicks. No pedal edema. Gastrointestinal system: Abdomen is nondistended, soft and nontender. No organomegaly or masses felt. Normal bowel sounds heard. Central nervous system: Alert and oriented.  No focal neurological deficits. Extremities: Symmetric 5 x 5 power. Skin: No rashes, lesions or ulcers Psychiatry: Judgement and insight appear normal. Mood & affect appropriate.    Data Reviewed: I have personally reviewed following labs and imaging studies  CBC: Recent Labs  Lab 07/30/18 0423 07/31/18 0434 08/01/18 0425 08/02/18 0453 08/03/18 0420  WBC 7.2 5.8 5.1 4.0 4.2  HGB 7.3* 7.4* 7.9* 7.8* 7.6*  HCT 23.6* 24.8*  25.4* 24.6* 23.7*  MCV 95.9 100.4* 96.9 97.6 94.0  PLT 204 247 291 264 433   Basic Metabolic Panel: Recent Labs  Lab 07/28/18 0350 07/29/18 0459  07/30/18 0449 07/30/18 0636 07/31/18 0434 08/01/18 0425 08/02/18 1125 08/03/18 0420  NA 135 135   < > 134* 131* 133* 132* 130* 130*  K 4.3 4.7   < > 5.2* 4.9 5.4* 5.3* 5.0 4.8  CL 95* 101  --  100 99 97* 92* 91* 90*  CO2 31 25  --  _0 GLUCOSE 107* 106*  --  100* 123* 81 84 95 88  BUN 10 10  --  _1 CREATININE 1.07* 0.94  --  0.87 0.89 0.91 1.00 1.04* 1.15*  CALCIUM 8.0* 8.0*  --  8.2* 8.1* 8.7* 9.0 9.0 8.8*  MG 1.9 1.9  --  1.7  --  1.6* 1.7 1.6* 1.7  PHOS 3.3 3.3  --   --   --  4.8* 4.3  --   --    < > = values in this interval not displayed.   GFR: Estimated Creatinine Clearance: 40.4 mL/min (A) (by C-G formula based on SCr of 1.15 mg/dL (H)). Liver Function Tests: Recent Labs  Lab 07/28/18 0350 07/29/18 0459  ALBUMIN 2.2* 2.1*   No results for input(s): LIPASE, AMYLASE in the last 168 hours. No results for input(s): AMMONIA in the last 168 hours. Coagulation Profile: Recent Labs  Lab 07/30/18 0423 07/31/18 0434 08/01/18 0425 08/02/18 0453 08/03/18 0420  INR 1.3* 1.2 1.1 1.1 1.2   Cardiac Enzymes: No results for input(s): CKTOTAL, CKMB, CKMBINDEX, TROPONINI in the last 168 hours. BNP (last 3 results) No results for input(s): PROBNP in the last 8760 hours. HbA1C: No results for input(s): HGBA1C in the last 72 hours. CBG: No results for input(s): GLUCAP in the last 168 hours. Lipid Profile: No results for input(s): CHOL, HDL, LDLCALC, TRIG, CHOLHDL, LDLDIRECT in the last 72 hours. Thyroid Function Tests: No results for input(s): TSH, T4TOTAL, FREET4, T3FREE, THYROIDAB in the last 72 hours. Anemia Panel: No results for input(s): VITAMINB12, FOLATE, FERRITIN, TIBC, IRON, RETICCTPCT in the last 72 hours. Sepsis Labs: Recent Labs  Lab 08/01/18 1050  PROCALCITON 0.14    No results  found for this or any previous visit (from the past 240 hour(s)).    Radiology Studies: No results found.  Scheduled Meds: . albuterol  2.5 mg Nebulization TID  . arformoterol  15 mcg Nebulization BID  . budesonide (PULMICORT) nebulizer solution  0.5 mg Nebulization BID  . buPROPion  50 mg Oral BID  . citalopram  10 mg Oral Daily  . glycopyrrolate  0.2 mg Intravenous BID  . guaiFENesin  600 mg Oral BID  . levothyroxine  125 mcg Oral Q0600  . lidocaine  1 patch Transdermal Q24H  . mouth rinse  15 mL Mouth Rinse BID  . pantoprazole  40 mg Oral BID  . sodium chloride flush  10-40 mL Intracatheter Q12H  . ticagrelor  60 mg Oral BID  . Warfarin - Pharmacist Dosing Inpatient   Does not apply q1800   Continuous Infusions: . sodium chloride 10 mL/hr at 07/31/18 1800  . heparin 1,200 Units/hr (08/03/18 1403)     LOS: 17 days   Assessment & Plan:   Active Problems:   Respiratory failure requiring intubation (HCC)   GI bleed   UGI bleed   Cardiac arrest (HCC)   Endobronchial mass   Acute on chronic hypoxic respiratory failure/severe left atelectasis/collapse: Patient has had 2 intubations followed by extubations and 2 bronchoscopies so far during this complicated hospitalization.  Her sputum culture were positive for stenotrophomonas maltophilia  and she has been on Bactrim for that.  PCCM signed off and patient has been transferred to [facility] starting 08/01/2018.  Currently on 3 L of nasal cannula oxygen which is her baseline.  CAD, a new wall motion abnormality on TTE 07/26/2018/now with chest pain: Patient was seen by cardiology at some point in time during this hospitalization but she was too sick to handle any sort of intervention so cardiology signed off.  She had chest pain on 08/01/2018.  EKG was done which showed no ST-T wave changes.  Cardiac enzymes were negative.  Her chest pain has resolved.  Status post cardiac arrest: Doing well now.  Paroxysmal atrial fibrillation: She is  in sinus rhythm now.  Continue aspirin and Brilinta.  She was on Coumadin but she had elevated INR so this was reversed and she has been on heparin.  I consulted pharmacy yesterday and resumed her Coumadin.  Dysphagia: SLP on board.  Currently on heart healthy diet.  History of hematemesis secondary to upper GI bleed: She is on warfarin however has subtherapeutic INR so she is on heparin bridging dose.  Remains on PPI.  Daily CBC.  Hemoglobin is stable.  Outpatient follow-up with GI.  Hypomagnesemia: Resolved.  Hyponatremia: 130.  Her baseline is around 132.  Continue to watch.   DVT prophylaxis: Heparin drip Code Status: Full code.  I talked to the palliative care nurse practitioner on call today who reassured me that she will talk to our colleague to get more information on patient and will talk to the patient and perhaps the family and figure out the CODE STATUS.  I will continue her on full code until palliative care figures out from family conversations. Family Communication: None present at bedside. Disposition Plan: PT recommends discharging to SNF.  She seems to be medically stabilized now.  Discussed with case manager, working on placement.  Hoping for discharge tomorrow.   Time spent: 32 min    Darliss Cheney, MD [facility] Pager [phone]  If 7PM-7AM, please contact night-coverage [email or web address] Password [email or web address] 08/03/2018, 2:40 PM

## 2018-08-03 NOTE — Progress Notes (Signed)
ANTICOAGULATION CONSULT NOTE - Follow Up Consult  Pharmacy Consult for Heparin   Indication: atrial fibrillation  Patient Measurements: Height: 5\' 4"  (162.6 cm) Weight: 132 lb 11.5 oz (60.2 kg) IBW/kg (Calculated) : 54.7  Vital Signs: Temp: 98.4 F (36.9 C) (07/25 0859) Temp Source: Oral (07/25 0859) BP: 122/61 (07/25 1316) Pulse Rate: 93 (07/25 1316)  Labs: Recent Labs    08/01/18 0425 08/01/18 1050 08/01/18 1500 08/02/18 0453 08/02/18 1125 08/03/18 0420 08/03/18 0537 08/03/18 1505  HGB 7.9*  --   --  7.8*  --  7.6*  --   --   HCT 25.4*  --   --  24.6*  --  23.7*  --   --   PLT 291  --   --  264  --  253  --   --   LABPROT 13.6  --   --  14.4  --  14.9  --   --   INR 1.1  --   --  1.1  --  1.2  --   --   HEPARINUNFRC 0.24*  --   --  0.75* 0.14* 0.99* 0.19* 0.30  CREATININE 1.00  --   --   --  1.04* 1.15*  --   --   TROPONINIHS  --  16 18*  --   --   --   --   --     Estimated Creatinine Clearance: 40.4 mL/min (A) (by C-G formula based on SCr of 1.15 mg/dL (H)).  Assessment: 68 yr old female with hx PE/DVT in setting of  Factor V Leiden on warfarin PTA which was reversed with [place] and Vitamin K on 07/17/18 d/t coagulapathy. No current plans for EGD per GI so CCM ordered heparin to start on 7/13. Now restarting warfarin with heparin bridge.  Warfarin regimen PTA: 5 mg MWFSun, 6 mg TTSat (1 mg tablet).   Heparin level remains subtherapeutic despite rate adjustment, CBC stable.  Goal of Therapy:  HL 0.15 - 0.2 units/mL Monitor platelets by anticoagulation protocol: Yes   Plan:  -Decrease IV heparin to 1100 units/hr. -Recheck heparin level in 6 hr -Daily heparin level and CBC.   Marguerite Olea, [facility] Clinical Pharmacist Phone [phone]  08/03/2018 5:03 PM

## 2018-08-03 NOTE — Progress Notes (Signed)
ANTICOAGULATION CONSULT NOTE - Follow Up Consult  Pharmacy Consult for heparin Indication: atrial fibrillation and Factor V Leiden  Labs: Recent Labs    08/01/18 0425 08/01/18 1050 08/01/18 1500 08/02/18 0453 08/02/18 1125 08/03/18 0420 08/03/18 0537 08/03/18 1505 08/03/18 2231  HGB 7.9*  --   --  7.8*  --  7.6*  --   --   --   HCT 25.4*  --   --  24.6*  --  23.7*  --   --   --   PLT 291  --   --  264  --  253  --   --   --   LABPROT 13.6  --   --  14.4  --  14.9  --   --   --   INR 1.1  --   --  1.1  --  1.2  --   --   --   HEPARINUNFRC 0.24*  --   --  0.75* 0.14* 0.99* 0.19* 0.30 0.36  CREATININE 1.00  --   --   --  1.04* 1.15*  --   --   --   TROPONINIHS  --  16 18*  --   --   --   --   --   --     Assessment: 68yo female supratherapeutic on heparin with higher heparin level despite decreased rate; no gtt issues or signs of bleeding per RN.  Goal of Therapy:  Heparin level 0.15-0.2 units/ml   Plan:  Will decrease heparin gtt by ~2 units/kg/hr to 1000 units/hr and check level in 8 hours.    Wynona Neat, PharmD, BCPS  08/03/2018,11:22 PM

## 2018-08-03 NOTE — Progress Notes (Signed)
ANTICOAGULATION CONSULT NOTE - Follow Up Consult  Pharmacy Consult for Heparin   Indication: atrial fibrillation  Patient Measurements: Height: 5\' 4"  (162.6 cm) Weight: 132 lb 11.5 oz (60.2 kg) IBW/kg (Calculated) : 54.7  Vital Signs: Temp: 98.7 F (37.1 C) (07/25 0500) Temp Source: Oral (07/25 0500) BP: 121/63 (07/25 0500) Pulse Rate: 93 (07/25 0500)  Labs: Recent Labs    08/01/18 0425 08/01/18 1050 08/01/18 1500 08/02/18 0453 08/02/18 1125 08/03/18 0420 08/03/18 0537  HGB 7.9*  --   --  7.8*  --  7.6*  --   HCT 25.4*  --   --  24.6*  --  23.7*  --   PLT 291  --   --  264  --  253  --   LABPROT 13.6  --   --  14.4  --  14.9  --   INR 1.1  --   --  1.1  --  1.2  --   HEPARINUNFRC 0.24*  --   --  0.75* 0.14* 0.99* 0.19*  CREATININE 1.00  --   --   --  1.04* 1.15*  --   TROPONINIHS  --  16 18*  --   --   --   --     Estimated Creatinine Clearance: 40.4 mL/min (A) (by C-G formula based on SCr of 1.15 mg/dL (H)).  Assessment: 68 yr old female with hx PE/DVT in setting of  Factor V Leiden on warfarin PTA which was reversed with [place] and Vitamin K on 07/17/18 d/t coagulapathy. No current plans for EGD per GI so CCM ordered heparin to start on 7/13. Now restarting warfarin with heparin bridge.  Warfarin regimen PTA: 5 mg MWFSun, 6 mg TTSat (1 mg tablet).   Heparin level remains subtherapeutic despite rate adjustment, CBC stable.  Goal of Therapy:  HL 0.15 - 0.2 units/mL Monitor platelets by anticoagulation protocol: Yes   Plan:  -Increase heparin to 1200 units/hr -Recheck heparin level in [place], PharmD, BCPS Clinical Pharmacist Please check AMION for all [place] numbers 08/03/2018

## 2018-08-03 NOTE — Progress Notes (Signed)
I have reviewed patients chart in detail and also her ACP records available in Epic (Forensic psychologist). She has an active DNR form on file and this was completed during an outpatient visit with pulmonology including goals of care conversation regarding her very severe COPD.  Given patient's stated wishes and historical documentation I have placed a DNR order for this hospitalization. Her daughter is not available this weekend. I am unclear about the role of APS in this case but we will need to further discuss the best and safest discharge plan for this patient. She meets general hospice criteria of <6 month prognosis, depending on her hospital course she may or may not be eligible for a hospice facility.  Lane Hacker, DO Palliative Medicine [phone]

## 2018-08-04 LAB — PROTIME-INR
INR: 1.2 (ref 0.8–1.2)
Prothrombin Time: 14.8 seconds (ref 11.4–15.2)

## 2018-08-04 LAB — CBC
HCT: 26.5 % — ABNORMAL LOW (ref 36.0–46.0)
Hemoglobin: 8.1 g/dL — ABNORMAL LOW (ref 12.0–15.0)
MCH: 29.9 pg (ref 26.0–34.0)
MCHC: 30.6 g/dL (ref 30.0–36.0)
MCV: 97.8 fL (ref 80.0–100.0)
Platelets: 262 10*3/uL (ref 150–400)
RBC: 2.71 MIL/uL — ABNORMAL LOW (ref 3.87–5.11)
RDW: 14.6 % (ref 11.5–15.5)
WBC: 5.4 10*3/uL (ref 4.0–10.5)
nRBC: 0 % (ref 0.0–0.2)

## 2018-08-04 LAB — HEPARIN LEVEL (UNFRACTIONATED)
Heparin Unfractionated: 0.29 IU/mL — ABNORMAL LOW (ref 0.30–0.70)
Heparin Unfractionated: 0.28 IU/mL — ABNORMAL LOW (ref 0.30–0.70)

## 2018-08-04 MED ORDER — WARFARIN SODIUM 2.5 MG PO TABS
2.5000 mg | ORAL_TABLET | Freq: Once | ORAL | Status: AC
  Administered 2018-08-04: 2.5 mg via ORAL
  Filled 2018-08-04: qty 1

## 2018-08-04 NOTE — Progress Notes (Signed)
ANTICOAGULATION CONSULT NOTE - Follow Up Consult  Pharmacy Consult for Heparin   Indication: atrial fibrillation  Patient Measurements: Height: 5\' 4"  (162.6 cm) Weight: 132 lb 11.5 oz (60.2 kg) IBW/kg (Calculated) : 54.7  Vital Signs: Temp: 98.5 F (36.9 C) (07/26 0801) Temp Source: Oral (07/26 1122) BP: 129/68 (07/26 1510) Pulse Rate: 88 (07/26 1510)  Labs: Recent Labs    08/02/18 0453 08/02/18 1125 08/03/18 0420  08/03/18 2231 08/04/18 0919 08/04/18 1704  HGB 7.8*  --  7.6*  --   --  8.1*  --   HCT 24.6*  --  23.7*  --   --  26.5*  --   PLT 264  --  253  --   --  262  --   LABPROT 14.4  --  14.9  --   --  14.8  --   INR 1.1  --  1.2  --   --  1.2  --   HEPARINUNFRC 0.75* 0.14* 0.99*   < > 0.36 0.29* 0.28*  CREATININE  --  1.04* 1.15*  --   --   --   --    < > = values in this interval not displayed.    Estimated Creatinine Clearance: 40.4 mL/min (A) (by C-G formula based on SCr of 1.15 mg/dL (H)).  Assessment: 68 yr old female with hx PE/DVT in setting of  Factor V Leiden on warfarin PTA which was reversed with [place] and Vitamin K on 07/17/18 d/t coagulapathy. No current plans for EGD per GI so CCM ordered heparin to start on 7/13. Restarted warfarin with heparin bridge. After discussion with MD, ok to increase heparin goal from 0.15-0.2 given resolution of bleeding and restarting warfarin.   Warfarin regimen PTA: 5 mg MWFSun, 6 mg TTSat (1 mg tablet).   Heparin level remains therapeutic at 0.28 on 1000 units/hr with tight goals as below  Goal of Therapy:  HL 0.2-0.5 units/mL INR 2-3 Monitor platelets by anticoagulation protocol: Yes   Plan:  Continue heparin gtt at 1000 units/hr Daily heparin level, CBC, s/s bleeding  Bertis Ruddy, PharmD Clinical Pharmacist Please check AMION for all [place] numbers 08/04/2018 5:54 PM

## 2018-08-04 NOTE — Progress Notes (Signed)
ANTICOAGULATION CONSULT NOTE - Follow Up Consult  Pharmacy Consult for Heparin   Indication: atrial fibrillation  Patient Measurements: Height: 5\' 4"  (162.6 cm) Weight: 132 lb 11.5 oz (60.2 kg) IBW/kg (Calculated) : 54.7  Vital Signs: Temp: 98.5 F (36.9 C) (07/26 0801) Temp Source: Oral (07/26 0801) BP: 125/61 (07/26 0801) Pulse Rate: 93 (07/26 0801)  Labs: Recent Labs    08/01/18 1050 08/01/18 1500  08/02/18 0453 08/02/18 1125 08/03/18 0420  08/03/18 1505 08/03/18 2231 08/04/18 0919  HGB  --   --    < > 7.8*  --  7.6*  --   --   --  8.1*  HCT  --   --   --  24.6*  --  23.7*  --   --   --  26.5*  PLT  --   --   --  264  --  253  --   --   --  262  LABPROT  --   --   --  14.4  --  14.9  --   --   --  14.8  INR  --   --   --  1.1  --  1.2  --   --   --  1.2  HEPARINUNFRC  --   --    < > 0.75* 0.14* 0.99*   < > 0.30 0.36 0.29*  CREATININE  --   --   --   --  1.04* 1.15*  --   --   --   --   TROPONINIHS 16 18*  --   --   --   --   --   --   --   --    < > = values in this interval not displayed.    Estimated Creatinine Clearance: 40.4 mL/min (A) (by C-G formula based on SCr of 1.15 mg/dL (H)).  Assessment: 68 yr old female with hx PE/DVT in setting of  Factor V Leiden on warfarin PTA which was reversed with [place] and Vitamin K on 07/17/18 d/t coagulapathy. No current plans for EGD per GI so CCM ordered heparin to start on 7/13. Restarted warfarin with heparin bridge. After discussion with MD, ok to increase heparin goal from 0.15-0.2 given resolution of bleeding and restarting warfarin.   Warfarin regimen PTA: 5 mg MWFSun, 6 mg TTSat (1 mg tablet).   Heparin level (0.29) therapeutic on drip rate 1000 units/hr. INR (1.2) subtherapeutic. Patient has only had one dose of warfarin, missed second dose last night. Dosing warfarin conservatively given recent bleed. CBC stable. No bleeding noted  Goal of Therapy:  HL 0.2-0.5 units/mL INR 2-3 Monitor platelets by  anticoagulation protocol: Yes   Plan:  -Continue IV heparin 1000 units/hr. -Warfarin 2.5 mg PO x1 tonight -Confirmatory heparin level in 6 hr -Daily heparin level and CBC.  Richardine Service, PharmD PGY1 Pharmacy Resident Phone: [phone] 08/04/2018  10:18 AM  Please check [email or web address] for unit-specific pharmacy phone numbers.

## 2018-08-04 NOTE — Progress Notes (Addendum)
PROGRESS NOTE  [name] JQB:[id number] DOB: August 25, 1950 DOA: Aug 08, 2018 PCP: [name], NP  HPI/Recap of past 24 hours: 68 yr old withCOPD, MI, HTN, and PE,Factor V Leiden onWarfarin(also on Brillinta)presented with large volumehematemesison late hrs of 08/08/2018. Coded in [facility] ED for [place]. Coagulopathy reversed with Vit K, [place].Transferred to [place] [place] [facility]. Received transfusions and GI consulted for EGD ( wanted pt to be off AC for 5 days).Extubatedon 7/9and transferred out to Internal Medicine service.Back to ICU 7/11 for mucus plug and left lung collapses/p thoracentesis was re-intubated and has a bronchoscopy on 7/11/ showed the whole left mainstem bronchus was occluded by mucous plug and thick secretions which extended all the way into the lower lobe. It was suctioned out and BAL was sent which was positive for Stenotrophomonas . Repeat Bronchosopy on 7/13 prior to extubation was negative. Extubated successfully on 7/13 and downgraded to Internal Medicine.  PCCM re-engaged 7/16 for increasing oxygen demand. Clinically appears to be pulm edema.Plan fordiuresisand aim to support, hopefully avoid intubation. Evening of 7/16 experienced PEA arrest. Got epi, atropine, and dopamine. Intubated and transferred to the ICU for further evaluation and management. Repeat bronchoscopy 7/17.  And transfer back to [facility] on 08/01/2018.  08/04/18: Patient was seen and examined at her bedside.  No acute events overnight.  No hemoptysis or hematemesis.  Denies chest pain, abdominal pain or nausea.  No bowel movement more than 2 days.  She has no new complaints at this time.  Assessment/Plan: Active Problems:   Respiratory failure requiring intubation (HCC)   GI bleed   UGI bleed   Cardiac arrest (HCC)   Endobronchial mass  Acute on chronic hypoxic respiratory failure post extubation likely secondary to severe left atelectasis and left lung collapse She has had prior  two intubations followed by extubation and 2 bronchoscopies during this hospitalization. Continue to maintain O2 saturation greater than 90% Continue nebs  Factor V [place] on Coumadin INR subtherapeutic Continue bridging with heparin drip Pharmacy managing.  Highly appreciated.  Paroxysmal A. fib In sinus rhythm Rate controlled Subtherapeutic on Coumadin Coumadin bridge with heparin drip  Acute blood loss anemia from GI bleed Hemoglobin stable at 8.1 today from 7.6 yesterday, MCV 97 No sign of overt bleeding at this time We will continue to monitor H&H  Recent massive GI bleed on Coumadin and Brilinta Prior hematemesis Continue PPI Currently stable  Recent cardiac arrest x2 08-08-2018 [facility] and 07/25/18 [facility] Closely monitor on telemetry Now DNR  Left mainstem bronchus Mucous plug/left lung collapse post thoracentesis 07/20/18 (320cc) and bronchoscopy Continue flutter valve Encourage cough and airway clearance  Coronary artery disease Denies anginal symptoms On Brilinta  Hypothyroidism-continue levothyroxine  Generalized weakness/physical debility PT to assess Fall precautions  Chronic anxiety/depression Continue home medication  Goals of care Palliative care team following.  Highly appreciated. Patient is DNR     Code Status: DNR  Family Communication: We will call family if okay with the patient  Disposition Plan: Home possibly in 1 to 2 days when INR is therapeutic   Consultants:  None   Procedures:  Intubated 2018-08-08 and extubated 07/18/2018  Left-sided thoracentesis on 07/20/2018  Bronchoscopy on 07/20/2018  Reintubated on 07/20/2018 and extubated on 07/22/2018  Cardiac arrest on 07/25/2018.  Repeat bronchoscopy on 07/26/2018   Antimicrobials:  None   DVT prophylaxis: Coumadin bridged with heparin.   Objective: Vitals:   08/04/18 0718 08/04/18 0801 08/04/18 1122 08/04/18 1341  BP:  125/61 105/89   Pulse:  93 (!) 108   Resp:  20 17    Temp:  98.5 F (36.9 C)    TempSrc:  Oral Oral   SpO2: 94% 99% 92% 100%  Weight:      Height:        Intake/Output Summary (Last 24 hours) at 08/04/2018 1404 Last data filed at 08/04/2018 1100 Gross per 24 hour  Intake 989.43 ml  Output 400 ml  Net 589.43 ml   Filed Weights   08/01/18 0420 08/02/18 0600 08/03/18 0241  Weight: 61.4 kg 59.6 kg 60.2 kg    Exam:  . General: 68 y.o. year-old female well developed well nourished in no acute distress.  Alert and oriented x3. . Cardiovascular: Regular rate and rhythm with no rubs or gallops.  No thyromegaly or JVD noted.   [name] Respiratory: Mild rales at bases.  No wheezes noted.  Poor inspiratory effort.  . Abdomen: Soft nontender nondistended with normal bowel sounds x4 quadrants. . Musculoskeletal: Trace lower extremity edema. 2/4 pulses in all 4 extremities. [name] Psychiatry: Mood is appropriate for condition and setting   Data Reviewed: CBC: Recent Labs  Lab 07/31/18 0434 08/01/18 0425 08/02/18 0453 08/03/18 0420 08/04/18 0919  WBC 5.8 5.1 4.0 4.2 5.4  HGB 7.4* 7.9* 7.8* 7.6* 8.1*  HCT 24.8* 25.4* 24.6* 23.7* 26.5*  MCV 100.4* 96.9 97.6 94.0 97.8  PLT 247 291 264 253 540   Basic Metabolic Panel: Recent Labs  Lab 07/29/18 0459  07/30/18 0449 07/30/18 0636 07/31/18 0434 08/01/18 0425 08/02/18 1125 08/03/18 0420  NA 135   < > 134* 131* 133* 132* 130* 130*  K 4.7   < > 5.2* 4.9 5.4* 5.3* 5.0 4.8  CL 101  --  100 99 97* 92* 91* 90*  CO2 25  --  _0 GLUCOSE 106*  --  100* 123* 81 84 95 88  BUN 10  --  _1 CREATININE 0.94  --  0.87 0.89 0.91 1.00 1.04* 1.15*  CALCIUM 8.0*  --  8.2* 8.1* 8.7* 9.0 9.0 8.8*  MG 1.9  --  1.7  --  1.6* 1.7 1.6* 1.7  PHOS 3.3  --   --   --  4.8* 4.3  --   --    < > = values in this interval not displayed.   GFR: Estimated Creatinine Clearance: 40.4 mL/min (A) (by C-G formula based on SCr of 1.15 mg/dL (H)). Liver Function Tests: Recent Labs  Lab 07/29/18 0459   ALBUMIN 2.1*   No results for input(s): LIPASE, AMYLASE in the last 168 hours. No results for input(s): AMMONIA in the last 168 hours. Coagulation Profile: Recent Labs  Lab 07/31/18 0434 08/01/18 0425 08/02/18 0453 08/03/18 0420 08/04/18 0919  INR 1.2 1.1 1.1 1.2 1.2   Cardiac Enzymes: No results for input(s): CKTOTAL, CKMB, CKMBINDEX, TROPONINI in the last 168 hours. BNP (last 3 results) No results for input(s): PROBNP in the last 8760 hours. HbA1C: No results for input(s): HGBA1C in the last 72 hours. CBG: No results for input(s): GLUCAP in the last 168 hours. Lipid Profile: No results for input(s): CHOL, HDL, LDLCALC, TRIG, CHOLHDL, LDLDIRECT in the last 72 hours. Thyroid Function Tests: No results for input(s): TSH, T4TOTAL, FREET4, T3FREE, THYROIDAB in the last 72 hours. Anemia Panel: No results for input(s): VITAMINB12, FOLATE, FERRITIN, TIBC, IRON, RETICCTPCT in the last 72 hours. Urine analysis:    Component Value Date/Time  COLORURINE YELLOW 05/23/2018 [place] 05/23/2018 0934   LABSPEC 1.014 05/23/2018 [place] 7.0 05/23/2018 0934   GLUCOSEU NEGATIVE 05/23/2018 0934   HGBUR SMALL (A) 05/23/2018 0934   BILIRUBINUR NEGATIVE 05/23/2018 0934   BILIRUBINUR n 09/21/2016 1432   KETONESUR NEGATIVE 05/23/2018 0934   PROTEINUR 30 (A) 05/23/2018 0934   UROBILINOGEN 0.2 09/21/2016 1432   UROBILINOGEN 0.2 04/13/2009 2025   NITRITE NEGATIVE 05/23/2018 0934   LEUKOCYTESUR NEGATIVE 05/23/2018 0934   Sepsis Labs: _0 (procalcitonin:4,lacticidven:4)  )No results found for this or any previous visit (from the past 240 hour(s)).    Studies: No results found.  Scheduled Meds: . albuterol  2.5 mg Nebulization TID  . arformoterol  15 mcg Nebulization BID  . budesonide (PULMICORT) nebulizer solution  0.5 mg Nebulization BID  . buPROPion  50 mg Oral BID  . citalopram  10 mg Oral Daily  . glycopyrrolate  0.2 mg Intravenous BID  .  guaiFENesin  600 mg Oral BID  . levothyroxine  125 mcg Oral Q0600  . lidocaine  1 patch Transdermal Q24H  . mouth rinse  15 mL Mouth Rinse BID  . pantoprazole  40 mg Oral BID  . sodium chloride flush  10-40 mL Intracatheter Q12H  . ticagrelor  60 mg Oral BID  . warfarin  2.5 mg Oral ONCE-1800  . Warfarin - Pharmacist Dosing Inpatient   Does not apply q1800    Continuous Infusions: . sodium chloride 10 mL/hr at 07/31/18 1800  . heparin 1,000 Units/hr (08/04/18 1243)     LOS: 18 days     Kayleen Memos, MD [facility] Pager [phone]  If 7PM-7AM, please contact night-coverage [email or web address] Password [email or web address] 08/04/2018, 2:04 PM

## 2018-08-05 LAB — PROTIME-INR
INR: 1.1 (ref 0.8–1.2)
Prothrombin Time: 13.6 seconds (ref 11.4–15.2)

## 2018-08-05 LAB — CBC
HCT: 27.8 % — ABNORMAL LOW (ref 36.0–46.0)
Hemoglobin: 8.8 g/dL — ABNORMAL LOW (ref 12.0–15.0)
MCH: 30.4 pg (ref 26.0–34.0)
MCHC: 31.7 g/dL (ref 30.0–36.0)
MCV: 96.2 fL (ref 80.0–100.0)
Platelets: 229 10*3/uL (ref 150–400)
RBC: 2.89 MIL/uL — ABNORMAL LOW (ref 3.87–5.11)
RDW: 14.6 % (ref 11.5–15.5)
WBC: 5.2 10*3/uL (ref 4.0–10.5)
nRBC: 0 % (ref 0.0–0.2)

## 2018-08-05 LAB — HEPARIN LEVEL (UNFRACTIONATED): Heparin Unfractionated: 0.34 IU/mL (ref 0.30–0.70)

## 2018-08-05 MED ORDER — ALUM & MAG HYDROXIDE-SIMETH 200-200-20 MG/5ML PO SUSP: 15.0000 mL | ORAL | Status: DC | PRN

## 2018-08-05 MED ORDER — WARFARIN SODIUM 5 MG PO TABS
5.0000 mg | ORAL_TABLET | Freq: Once | ORAL | Status: AC
  Administered 2018-08-05: 5 mg via ORAL
  Filled 2018-08-05: qty 1

## 2018-08-05 NOTE — Progress Notes (Signed)
ANTICOAGULATION CONSULT NOTE - Follow Up Consult  Pharmacy Consult for Heparin   Indication: atrial fibrillation  Patient Measurements: Height: 5\' 4"  (162.6 cm) Weight: 135 lb 12.9 oz (61.6 kg) IBW/kg (Calculated) : 54.7  Vital Signs: Temp: 98.6 F (37 C) (07/27 0846) Temp Source: Oral (07/27 0846) BP: 119/45 (07/27 0912) Pulse Rate: 79 (07/27 0912)  Labs: Recent Labs    08/02/18 1125  08/03/18 0420  08/04/18 0919 08/04/18 1704 08/05/18 0411  HGB  --    < > 7.6*  --  8.1*  --  8.8*  HCT  --   --  23.7*  --  26.5*  --  27.8*  PLT  --   --  253  --  262  --  229  LABPROT  --   --  14.9  --  14.8  --  13.6  INR  --   --  1.2  --  1.2  --  1.1  HEPARINUNFRC 0.14*  --  0.99*   < > 0.29* 0.28* 0.34  CREATININE 1.04*  --  1.15*  --   --   --   --    < > = values in this interval not displayed.    Estimated Creatinine Clearance: 40.4 mL/min (A) (by C-G formula based on SCr of 1.15 mg/dL (H)).  Assessment: 68 yr old female with hx PE/DVT in setting of  Factor V Leiden on warfarin PTA which was reversed with [place] and Vitamin K on 07/17/18 d/t coagulapathy. No current plans for EGD per GI so CCM ordered heparin to start on 7/13. Restarted warfarin with heparin bridge. After discussion with MD, ok to increase heparin goal from 0.15-0.2 given resolution of bleeding and restarting warfarin.   Warfarin regimen PTA: 5 mg MWFSun, 6 mg TTSat (1 mg tablet).   Heparin level remains therapeutic (HL 0.34 << 0.28, goal of 0.2-0.5). INR remains SUBtherapeutic and trending down (INR 1.1 << 1.2, goal of 2-3). Hgb/Hct trending up, plts wnl - no overt bleeding noted.   Goal of Therapy:  HL 0.2-0.5 units/mL INR 2-3 Monitor platelets by anticoagulation protocol: Yes   Plan:  - Continue heparin gtt at 1000 units/hr - Warfarin 5 mg x 1 dose at 1800 today - Will continue to monitor for any signs/symptoms of bleeding and will follow up with heparin level and PT/INR in the a.m.   Thank you for  allowing pharmacy to be a part of this patient's care.  Alycia Rossetti, PharmD, BCPS Clinical Pharmacist Clinical phone for 08/05/2018: [phone] 08/05/2018 9:24 AM   **Pharmacist phone directory can now be found on [email or web address] ([email or web address]).  Listed under [facility].

## 2018-08-05 NOTE — Progress Notes (Signed)
Palliative:  HPI: 68 y.o.femaleadmitted on 7/7/2020from [place], [place]. She has 1 daughter [name] and also has a son (estranged). She states that she lives by herself. She has past medical history significant for COPD for which she sees outpatient pulmonary specialist, also has a history of coronary artery disease for which she has followed up with outpatient cardiology. She has history of hypertension PE and factor V Leiden and is maintained on oral anticoagulation in the outpatient setting. Patient presented initially to [facility] with large volume hematemesis and underwent a PEA arrest shortly thereafter with second PEA arrest 07/25/18. She was resuscitated and transferred to [facility]. She has been seen gastroenterologist, pulmonary medicine and critical care medicine. This has been a prolonged and complicated hospital course. She has required several episodes of intubations and extubations. She has been found to have mucous plug and lung collapse for which she required thoracenteses and bronchoscopy. She had mucous plug causing mainstem bronchus blockage. She continues to have weakness, ongoing symptoms of chest pain, and is not eating well. She continues with chest pain but her eating has improved although her weakness and motivation continues to be poor.   I met again today with [name]. We were able to have a conversation again. [name] reconfirms her wishes for DNR and interest in hospice. She would like to continue anticoagulation and current treatments. She tells me "I'm ready to go but I don't want to rush it." She seems much more focused in conversation today and no signs of hallucinations. She is able to hold coherent conversation. Problem now is that she is not yet eligible for hospice facility (< 2 weeks prognosis) and does not have support to have adequate care even with hospice at home. Unsure if there are many other options other than SNF with palliative to  follow and could assist with adding hospice if she declines.   We also discussed her wishes for thin liquids. She has been begging for thin liquids. She tells me that she was having a bad moment when SLP recommended thickened liquids. I verified that she understand that she could aspirate on thin liquids and willing to accept this risk - she says yes. She does not desire transition to ICU or further ventilator support.   I called and spoke with daughter, [name], with [name]'s permission. [name] is able to make her own decisions but is concerned with her daughters feelings about these decisions. She asked me to explain this further to [place]. I did explain to [place] via telephone. I encouraged communication and [name] would like to come visit her mother but shares a car and unable to come until this evening. I did speak with unit staff and had this approved. I explained that [name] has been consistent about her wishes for DNR and this has been put in place. [name] understands. I explained to her that her mother is tired and does not feel that she can go through another resuscitation attempt. She has low motivation and feels that her time left to live could be limited. [name] understands.   All questions/concerns addressed. Emotional support provided.   Exam: Alert, oriented. More focused today. No distress. + central chest pain tender to touch. Breathing regular, unlabored. Abd flat. Generalized weakness.   Plan: - DNR confirmed and family agree with plan.  - Maalox/Mylanta prn for chest pain that improved after belching. Also on protonix BID.  - Chest pain tender to palpation likely s/t chest compressions: Lidoderm patch. OxyIR for moderate and  morphine for severe prn relief.  - SNF with palliative to follow unless she further declines. Open to hospice.   [place], NP Palliative Medicine Team Pager [phone] (Please see [email or web address] for schedule) Team Phone  [phone]    Greater than 50%  of this time was spent counseling and coordinating care related to the above assessment and plan   The above conversation was completed via telephone due to the visitor restrictions during the COVID-19 pandemic. Thorough chart review and discussion with necessary members of the care team was completed as part of assessment.

## 2018-08-05 NOTE — Progress Notes (Addendum)
Pt daughter and grand daughter came to visit with permission from palliative. During the visit patient stated for me to"cut of DNR band that her daughter was not ready." The daughter told her mother that "it was up to her." Pt wanted me to contact the doctor to get code status changed to full code. I paged [facility] on call

## 2018-08-05 NOTE — Care Management Important Message (Signed)
Important Message  Patient Details  Name: [name] MRN: [id number] Date of Birth: October 27, 1950   Medicare Important Message Given:  Yes     [name] 08/05/2018, 1:32 PM

## 2018-08-05 NOTE — Progress Notes (Signed)
Physical Therapy Treatment Patient Details Name: [name] MRN: [id number] DOB: 03/29/1950 Today's Date: 08/05/2018    History of Present Illness 68 year old with Factor V Leiden on warfarin presented with hematemesis. Coded in [facility] ED for 12 minutes. Extubated 7/9, back to ICU 7/11 for mucus plug and L lung collapse s/p thoracentesis and reintubated 7/11-7/13. 7/16 PEA arrest. Intubated and transferred to ICU, repeat broncoscopy 7/17. Extubated 7/21. PMH: COPD, MI, HTN,anteriosclerotic cardiovascular disease, chronic respiratory failure, tobacco use, and PE.     PT Comments    Patient received in bed. Declined all PT initially, with some encouragement she agrees to bed LE strengthening exercises, but no out of bed activities.  Patient tolerated exercises well. She will benefit from continued skilled PT to improve functional independence, strength and activity tolerance.      Follow Up Recommendations  SNF     Equipment Recommendations  None recommended by PT    Recommendations for Other Services       Precautions / Restrictions Precautions Precautions: Fall Restrictions Weight Bearing Restrictions: No    Mobility  Bed Mobility               General bed mobility comments: patient declined all mobility this day  Transfers                    Ambulation/Gait                 Stairs             Wheelchair Mobility    Modified Rankin (Stroke Patients Only)       Balance                                            Cognition Arousal/Alertness: Awake/alert Behavior During Therapy: WFL for tasks assessed/performed Overall Cognitive Status: Within Functional Limits for tasks assessed                                        Exercises Other Exercises Other Exercises: supine LE : ap, heel slides, hip abd, SLR x 10 reps bilaterally.    General Comments        Pertinent Vitals/Pain Pain  Assessment: Faces Faces Pain Scale: Hurts little more Pain Location: chest soreness from CPR Pain Descriptors / Indicators: Discomfort;Guarding;Sore Pain Intervention(s): Monitored during session    Home Living                      Prior Function            PT Goals (current goals can now be found in the care plan section) Acute Rehab PT Goals Patient Stated Goal: none stated PT Goal Formulation: With patient Time For Goal Achievement: 08/10/18 Potential to Achieve Goals: Good Progress towards PT goals: Not progressing toward goals - comment(declining out of bed activities despite encouragement)    Frequency    Min 3X/week      PT Plan Current plan remains appropriate    Co-evaluation              AM-PAC PT "6 Clicks" Mobility   Outcome Measure  Help needed turning from your back to your side while in a flat bed without using bedrails?: A Little  Help needed moving from lying on your back to sitting on the side of a flat bed without using bedrails?: A Lot Help needed moving to and from a bed to a chair (including a wheelchair)?: A Lot Help needed standing up from a chair using your arms (e.g., wheelchair or bedside chair)?: A Lot Help needed to walk in hospital room?: A Lot Help needed climbing 3-5 steps with a railing? : A Lot 6 Click Score: 13    End of Session   Activity Tolerance: Patient tolerated treatment well Patient left: in bed;with call bell/phone within reach Nurse Communication: Mobility status PT Visit Diagnosis: Muscle weakness (generalized) (M62.81);Difficulty in walking, not elsewhere classified (R26.2);Other abnormalities of gait and mobility (R26.89);Pain Pain - part of body: (chest)     Time: 1300-1315 PT Time Calculation (min) (ACUTE ONLY): 15 min  Charges:  $Therapeutic Exercise: 8-22 mins                      Grosser, PT, GCS 08/05/18,1:38 PM

## 2018-08-05 NOTE — Progress Notes (Signed)
PROGRESS NOTE  [name] OVF:[id number] DOB: 1950/09/27 DOA: 2018-08-03 PCP: [name], NP  HPI/Recap of past 24 hours: 68 yr old withCOPD, MI, HTN, and PE,Factor V Leiden onWarfarin(also on Brillinta)presented with large volumehematemesison late hrs of August 03, 2018. Coded in [facility] ED for [place]. Coagulopathy reversed with Vit K, [place].Transferred to [place] on PPI. Received transfusions and GI consulted for EGD ( wanted pt to be off AC for 5 days).Extubatedon 7/9and transferred out to Internal Medicine service.Back to ICU 7/11 for mucus plug and left lung collapses/p thoracentesis was re-intubated and has a bronchoscopy on 7/11/ showed the whole left mainstem bronchus was occluded by mucous plug and thick secretions which extended all the way into the lower lobe. It was suctioned out and BAL was sent which was positive for Stenotrophomonas . Repeat Bronchosopy on 7/13 prior to extubation was negative. Extubated successfully on 7/13 and downgraded to Internal Medicine.  PCCM re-engaged 7/16 for increasing oxygen demand. Clinically appears to be pulm edema.Plan fordiuresisand aim to support, hopefully avoid intubation. Evening of 7/16 experienced PEA arrest. Got epi, atropine, and dopamine. Intubated and transferred to the ICU for further evaluation and management. Repeat bronchoscopy 7/17.  And transfer back to [facility] on 08/01/2018.  08/05/18: Patient was seen and examined at her bedside.  No acute events overnight.  No hemoptysis or hematemesis.  Reports sharp pain on mid chest that is reproducible on palpation.  No other complaints.  INR subtherapeutic 1.1, Coumadin bridged with heparin drip.  Assessment/Plan: Active Problems:   Respiratory failure requiring intubation (HCC)   GI bleed   UGI bleed   Cardiac arrest (HCC)   Endobronchial mass  Acute on chronic hypoxic respiratory failure post extubation likely secondary to severe left atelectasis and left lung  collapse She has had prior two intubations followed by extubation and 2 bronchoscopies during this hospitalization. Continue to maintain O2 saturation greater than 90% Continue nebs Currently on 3 L of oxygen by nasal cannula saturating 98%.  Factor V [place] on Coumadin INR subtherapeutic 1.1 on 08/05/2018 Continue bridging with heparin drip Pharmacy managing.  Highly appreciated.  Paroxysmal A. fib In sinus rhythm Rate controlled Subtherapeutic on Coumadin Coumadin bridge with heparin drip  Mid chest tenderness with palpation possibly related to recent chest compression with cardiac arrest Closely monitor on telemetry and treat symptomatically   Acute blood loss anemia from GI bleed Hemoglobin stable at 8.8 on 08/05/2018 from 8.1 from 7.6 yesterday, MCV 97 No sign of overt bleeding at this time We will continue to monitor H&H  Recent massive GI bleed on Coumadin and Brilinta Prior hematemesis Continue PPI Currently stable  Recent cardiac arrest x2 08/03/2018 [facility] and 07/25/18 [facility] Closely monitor on telemetry Now DNR  Left mainstem bronchus Mucous plug/left lung collapse post thoracentesis 07/20/18 (320cc) and bronchoscopy Continue flutter valve Encourage cough and airway clearance  Coronary artery disease Denies anginal symptoms On Brilinta  Hypothyroidism-continue levothyroxine  Generalized weakness/physical debility PT to assess Fall precautions  Chronic anxiety/depression Continue home medication  Goals of care Palliative care team following.  Highly appreciated. Patient is DNR     Code Status: DNR  Family Communication: We will call family if okay with the patient  Disposition Plan: Home possibly in 1 to 2 days when INR is therapeutic   Consultants:  None   Procedures:  Intubated 08/03/2018 and extubated 07/18/2018  Left-sided thoracentesis on 07/20/2018  Bronchoscopy on 07/20/2018  Reintubated on 07/20/2018 and extubated on 07/22/2018   Cardiac arrest on  07/25/2018.  Repeat bronchoscopy on 07/26/2018   Antimicrobials:  None   DVT prophylaxis: Coumadin bridged with heparin.   Objective: Vitals:   08/05/18 0846 08/05/18 0911 08/05/18 0912 08/05/18 1330  BP: (!) 119/45  (!) 119/45   Pulse: 79  79   Resp: 19  15   Temp: 98.6 F (37 C)     TempSrc: Oral     SpO2: 90% 97% 97% 100%  Weight:      Height:        Intake/Output Summary (Last 24 hours) at 08/05/2018 1400 Last data filed at 08/05/2018 1158 Gross per 24 hour  Intake 1105.34 ml  Output 200 ml  Net 905.34 ml   Filed Weights   08/02/18 0600 08/03/18 0241 08/05/18 0621  Weight: 59.6 kg 60.2 kg 61.6 kg    Exam:  . General: 68 y.o. year-old female well developed well nourished in no acute distress. Alert and interactive. . Cardiovascular: Regular rate and rhythm no rubs or gallops no JVD or thyromegaly noted.  Tenderness on palpation mid chest, likely from recent CPR. [name] Respiratory: Clear to auscultation.  No wheezes or rales.  Poor inspiratory effort. . Abdomen: Soft Nontender Nondistended Normal Bowel Sounds Present.  Musculoskeletal: Trace lower extremity edema.  2 out of 4 pulses in all 4 extremities. [name] Psychiatry: Mood is appropriate for condition and setting.  Data Reviewed: CBC: Recent Labs  Lab 08/01/18 0425 08/02/18 0453 08/03/18 0420 08/04/18 0919 08/05/18 0411  WBC 5.1 4.0 4.2 5.4 5.2  HGB 7.9* 7.8* 7.6* 8.1* 8.8*  HCT 25.4* 24.6* 23.7* 26.5* 27.8*  MCV 96.9 97.6 94.0 97.8 96.2  PLT 291 264 253 262 071   Basic Metabolic Panel: Recent Labs  Lab 07/30/18 0449 07/30/18 0636 07/31/18 0434 08/01/18 0425 08/02/18 1125 08/03/18 0420  NA 134* 131* 133* 132* 130* 130*  K 5.2* 4.9 5.4* 5.3* 5.0 4.8  CL 100 99 97* 92* 91* 90*  CO2 _0 GLUCOSE 100* 123* 81 84 95 88  BUN _1 CREATININE 0.87 0.89 0.91 1.00 1.04* 1.15*  CALCIUM 8.2* 8.1* 8.7* 9.0 9.0 8.8*  MG 1.7  --  1.6* 1.7 1.6* 1.7  PHOS  --   --  4.8*  4.3  --   --    GFR: Estimated Creatinine Clearance: 40.4 mL/min (A) (by C-G formula based on SCr of 1.15 mg/dL (H)). Liver Function Tests: No results for input(s): AST, ALT, ALKPHOS, BILITOT, PROT, ALBUMIN in the last 168 hours. No results for input(s): LIPASE, AMYLASE in the last 168 hours. No results for input(s): AMMONIA in the last 168 hours. Coagulation Profile: Recent Labs  Lab 08/01/18 0425 08/02/18 0453 08/03/18 0420 08/04/18 0919 08/05/18 0411  INR 1.1 1.1 1.2 1.2 1.1   Cardiac Enzymes: No results for input(s): CKTOTAL, CKMB, CKMBINDEX, TROPONINI in the last 168 hours. BNP (last 3 results) No results for input(s): PROBNP in the last 8760 hours. HbA1C: No results for input(s): HGBA1C in the last 72 hours. CBG: No results for input(s): GLUCAP in the last 168 hours. Lipid Profile: No results for input(s): CHOL, HDL, LDLCALC, TRIG, CHOLHDL, LDLDIRECT in the last 72 hours. Thyroid Function Tests: No results for input(s): TSH, T4TOTAL, FREET4, T3FREE, THYROIDAB in the last 72 hours. Anemia Panel: No results for input(s): VITAMINB12, FOLATE, FERRITIN, TIBC, IRON, RETICCTPCT in the last 72 hours. Urine analysis:    Component Value Date/Time   COLORURINE YELLOW 05/23/2018 0934  APPEARANCEUR CLEAR 05/23/2018 0934   LABSPEC 1.014 05/23/2018 [place] 7.0 05/23/2018 0934   GLUCOSEU NEGATIVE 05/23/2018 0934   HGBUR SMALL (A) 05/23/2018 0934   BILIRUBINUR NEGATIVE 05/23/2018 0934   BILIRUBINUR n 09/21/2016 1432   KETONESUR NEGATIVE 05/23/2018 0934   PROTEINUR 30 (A) 05/23/2018 0934   UROBILINOGEN 0.2 09/21/2016 1432   UROBILINOGEN 0.2 04/13/2009 2025   NITRITE NEGATIVE 05/23/2018 0934   LEUKOCYTESUR NEGATIVE 05/23/2018 0934   Sepsis Labs: _0 (procalcitonin:4,lacticidven:4)  )No results found for this or any previous visit (from the past 240 hour(s)).    Studies: No results found.  Scheduled Meds: . albuterol  2.5 mg Nebulization TID  .  arformoterol  15 mcg Nebulization BID  . budesonide (PULMICORT) nebulizer solution  0.5 mg Nebulization BID  . buPROPion  50 mg Oral BID  . citalopram  10 mg Oral Daily  . glycopyrrolate  0.2 mg Intravenous BID  . guaiFENesin  600 mg Oral BID  . levothyroxine  125 mcg Oral Q0600  . lidocaine  1 patch Transdermal Q24H  . mouth rinse  15 mL Mouth Rinse BID  . pantoprazole  40 mg Oral BID  . sodium chloride flush  10-40 mL Intracatheter Q12H  . ticagrelor  60 mg Oral BID  . warfarin  5 mg Oral ONCE-1800  . Warfarin - Pharmacist Dosing Inpatient   Does not apply q1800    Continuous Infusions: . sodium chloride 10 mL/hr at 07/31/18 1800  . heparin 1,000 Units/hr (08/04/18 2300)     LOS: 19 days     Kayleen Memos, MD [facility] Pager [phone]  If 7PM-7AM, please contact night-coverage [email or web address] Password [email or web address] 08/05/2018, 2:00 PM

## 2018-08-05 NOTE — Progress Notes (Signed)
After family visited, pt changed her mind about code status. She rescinded DNR in favor of FULL CODE. Order changed.  KJKG, NP [facility]

## 2018-08-06 LAB — CBC
HCT: 27.3 % — ABNORMAL LOW (ref 36.0–46.0)
Hemoglobin: 8.4 g/dL — ABNORMAL LOW (ref 12.0–15.0)
MCH: 30.5 pg (ref 26.0–34.0)
MCHC: 30.8 g/dL (ref 30.0–36.0)
MCV: 99.3 fL (ref 80.0–100.0)
Platelets: 228 10*3/uL (ref 150–400)
RBC: 2.75 MIL/uL — ABNORMAL LOW (ref 3.87–5.11)
RDW: 15.1 % (ref 11.5–15.5)
WBC: 4.9 10*3/uL (ref 4.0–10.5)
nRBC: 0.4 % — ABNORMAL HIGH (ref 0.0–0.2)

## 2018-08-06 LAB — PROTIME-INR
INR: 1.1 (ref 0.8–1.2)
Prothrombin Time: 13.9 seconds (ref 11.4–15.2)

## 2018-08-06 LAB — HEPARIN LEVEL (UNFRACTIONATED)
Heparin Unfractionated: 0.76 IU/mL — ABNORMAL HIGH (ref 0.30–0.70)
Heparin Unfractionated: 0.37 IU/mL (ref 0.30–0.70)

## 2018-08-06 MED ORDER — BISACODYL 10 MG RE SUPP: 10.0000 mg | Freq: Every day | RECTAL | Status: DC | PRN

## 2018-08-06 MED ORDER — POLYETHYLENE GLYCOL 3350 17 G PO PACK
17.0000 g | PACK | Freq: Every day | ORAL | Status: DC
  Administered 2018-08-06 – 2018-08-07 (×2): 17 g via ORAL
  Filled 2018-08-06 (×2): qty 1

## 2018-08-06 MED ORDER — ALBUTEROL SULFATE (2.5 MG/3ML) 0.083% IN NEBU
2.5000 mg | INHALATION_SOLUTION | Freq: Two times a day (BID) | RESPIRATORY_TRACT | Status: DC
  Administered 2018-08-06 – 2018-08-12 (×12): 2.5 mg via RESPIRATORY_TRACT
  Filled 2018-08-06 (×12): qty 3

## 2018-08-06 MED ORDER — WARFARIN SODIUM 5 MG PO TABS
5.0000 mg | ORAL_TABLET | Freq: Once | ORAL | Status: AC
  Administered 2018-08-06: 5 mg via ORAL
  Filled 2018-08-06: qty 1

## 2018-08-06 MED ORDER — SENNA 8.6 MG PO TABS
2.0000 | ORAL_TABLET | Freq: Two times a day (BID) | ORAL | Status: DC
  Administered 2018-08-06 – 2018-08-09 (×6): 17.2 mg via ORAL
  Filled 2018-08-06 (×7): qty 2

## 2018-08-06 MED ORDER — BISACODYL 10 MG RE SUPP
10.0000 mg | Freq: Once | RECTAL | Status: AC
  Administered 2018-08-06: 10 mg via RECTAL
  Filled 2018-08-06: qty 1

## 2018-08-06 NOTE — NC FL2 (Signed)
[place] LEVEL OF CARE SCREENING TOOL     IDENTIFICATION  Patient Name: [name] Birthdate: 05-23-1950 Sex: female Admission Date (Current Location): 07/16/2018  [facility] and [place] Number:  Herbalist and Address:  [facility] [place], [place] [address] [place], [place] [address]      Provider Number: [id number]  Attending Physician Name and Address:  Kayleen Memos, DO  Relative Name and Phone Number:       Current Level of Care: Hospital Recommended Level of Care: [place] Prior Approval Number:    Date Approved/Denied:   PASRR Number: [id number]  Discharge Plan: SNF    Current Diagnoses: Patient Active Problem List   Diagnosis Date Noted  . Endobronchial mass   . GI bleed 07/17/2018  . UGI bleed 07/17/2018  . Cardiac arrest ([place])   . Hematemesis   . Elevated troponin   . Malnutrition of moderate degree 05/24/2018  . Acute on chronic respiratory failure ([place]) 05/23/2018  . Melena 05/23/2018  . Factor V deficiency ([place]) 05/23/2018  . Depression 05/23/2018  . Chronic pain syndrome 05/23/2018  . Hypercapnia 05/23/2018  . Acute on chronic respiratory failure with hypoxia and hypercapnia ([place]) 01/29/2018  . Acute and chronic respiratory failure (acute-on-chronic) ([place]) 01/29/2018  . Pressure injury of skin 01/29/2018  . Hyperglycemia 01/21/2018  . COPD with exacerbation ([place]) 01/21/2018  . Vertebral fracture, osteoporotic ([place]) 01/20/2018  . Rectal bleeding 09/05/2017  . Left lower quadrant pain 09/05/2017  . Oxygen dependent 09/05/2017  . Chronic back pain 05/16/2017  . COPD exacerbation ([place]) 02/19/2017  . Tachycardia 02/19/2017  . Influenza A 02/19/2017  . Acute respiratory failure ([place]) 02/19/2017  . Dyspnea 11/26/2014  . Loss of weight 11/26/2014  . Respiratory failure requiring intubation ([place]) 08/27/2013  . Chest pain 08/24/2013  . Encounter for therapeutic drug monitoring 02/13/2013  .  Abnormal weight loss 03/21/2012  . Cerebrovascular disease 11/15/2011  . CAD S/P percutaneous coronary angioplasty   . Factor V Leiden, prothrombin gene mutation ([place])   . Hyperlipidemia   . Chronic anticoagulation   . Hypothyroidism 11/23/2006  . TOBACCO ABUSE 11/23/2006  . History of pulmonary embolus (PE) 11/23/2006  . COPD (chronic obstructive pulmonary disease) with emphysema ([place]) 11/23/2006    Orientation RESPIRATION BLADDER Height & Weight     Time, Self, Situation, Place  Normal External catheter, Incontinent(placed 7.28) Weight: 134 lb 11.2 oz (61.1 kg) Height:  5\' 4"  (162.6 cm)  BEHAVIORAL SYMPTOMS/MOOD NEUROLOGICAL BOWEL NUTRITION STATUS      Continent Diet(heart healthy, thin liquids)  AMBULATORY STATUS COMMUNICATION OF NEEDS Skin   Extensive Assist Verbally Normal                       Personal Care Assistance Level of Assistance  Bathing, Feeding, Dressing Bathing Assistance: Maximum assistance Feeding assistance: Limited assistance Dressing Assistance: Maximum assistance     Functional Limitations Info  Sight, Speech, Hearing Sight Info: Adequate Hearing Info: Adequate Speech Info: Adequate    SPECIAL CARE FACTORS FREQUENCY  PT (By licensed PT), OT (By licensed OT)     PT Frequency: 5x OT Frequency: 5x            Contractures Contractures Info: Not present    Additional Factors Info  Code Status, Allergies Code Status Info: Full Code Allergies Info: Dilaudid (Hydromorphone Hcl), Minocycline Hcl, Prednisone, Varenicline Tartrate, Zocor (Simvastatin - High Dose)  Current Medications (08/06/2018):  This is the current hospital active medication list Current Facility-Administered Medications  Medication Dose Route Frequency Provider Last Rate Last Dose  . 0.9 %  sodium chloride infusion   Intravenous PRN Schorr, Rhetta Mura, NP 10 mL/hr at 07/31/18 1800    . albuterol (PROVENTIL) (2.5 MG/3ML) 0.083% nebulizer solution 2.5 mg  2.5  mg Nebulization Q4H PRN Schorr, Rhetta Mura, NP   2.5 mg at 07/31/18 1831  . albuterol (PROVENTIL) (2.5 MG/3ML) 0.083% nebulizer solution 2.5 mg  2.5 mg Nebulization TID Rush Farmer, MD   2.5 mg at 08/06/18 0805  . alum & mag hydroxide-simeth (MAALOX/MYLANTA) 200-200-20 MG/5ML suspension 15 mL  15 mL Oral I0X PRN Pershing Proud, NP      . arformoterol (BROVANA) nebulizer solution 15 mcg  15 mcg Nebulization BID Schorr, Rhetta Mura, NP   15 mcg at 08/06/18 0805  . budesonide (PULMICORT) nebulizer solution 0.5 mg  0.5 mg Nebulization BID Schorr, Rhetta Mura, NP   0.5 mg at 08/06/18 0805  . buPROPion (WELLBUTRIN) tablet 50 mg  50 mg Oral BID Darliss Cheney, MD   50 mg at 08/05/18 1136  . citalopram (CELEXA) tablet 10 mg  10 mg Oral Daily Darliss Cheney, MD   10 mg at 08/05/18 1138  . glycopyrrolate (ROBINUL) injection 0.2 mg  0.2 mg Intravenous BID [name], MD   0.2 mg at 08/05/18 2327  . guaiFENesin (MUCINEX) 12 hr tablet 600 mg  600 mg Oral BID Helberg, Justin, MD   600 mg at 08/05/18 2326  . heparin ADULT infusion 100 units/mL (25000 units/240mL sodium chloride 0.45%)  1,000 Units/hr Intravenous Continuous Laren Everts, RPH 10 mL/hr at 08/06/18 0100 1,000 Units/hr at 08/06/18 0100  . levothyroxine (SYNTHROID) tablet 125 mcg  125 mcg Oral Q0600 Darliss Cheney, MD   125 mcg at 08/06/18 0616  . lidocaine (LIDODERM) 5 % 1 patch  1 patch Transdermal Q24H Dana Allan I, MD   1 patch at 08/05/18 1142  . MEDLINE mouth rinse  15 mL Mouth Rinse BID Rush Farmer, MD   15 mL at 08/05/18 1157  . morphine 2 MG/ML injection 2 mg  2 mg Intravenous Q2H PRN Rush Farmer, MD   2 mg at 08/04/18 1643  . ondansetron (ZOFRAN) injection 4 mg  4 mg Intravenous Q6H PRN Schorr, Rhetta Mura, NP   4 mg at 08/03/18 2123  . oxyCODONE-acetaminophen (PERCOCET/ROXICET) 5-325 MG per tablet 1 tablet  1 tablet Oral Q6H PRN Rush Farmer, MD   1 tablet at 08/06/18 [phone]  . pantoprazole (PROTONIX) EC tablet 40  mg  40 mg Oral BID Darliss Cheney, MD   40 mg at 08/05/18 2326  . polyethylene glycol (MIRALAX / GLYCOLAX) packet 17 g  17 g Oral Daily [place], [place] N, DO      . Resource ThickenUp Clear   Oral PRN Rush Farmer, MD      . senna [facility]) tablet 17.2 mg  2 tablet Oral BID Irene Pap N, DO      . sodium chloride flush (NS) 0.9 % injection 10-40 mL  10-40 mL Intracatheter Q12H Schorr, Rhetta Mura, NP   10 mL at 08/05/18 2200  . sodium chloride flush (NS) 0.9 % injection 10-40 mL  10-40 mL Intracatheter PRN Schorr, Rhetta Mura, NP      . ticagrelor (BRILINTA) tablet 60 mg  60 mg Oral BID Schorr, Rhetta Mura, NP   60 mg at 08/05/18  2327  . Warfarin - Pharmacist Dosing Inpatient   Does not apply q1800 Richardine Service, [facility]         Discharge Medications: Please see discharge summary for a list of discharge medications.  Relevant Imaging Results:  Relevant Lab Results:   Additional Information [id number]  Gerrianne Scale Henli Hey, LCSW

## 2018-08-06 NOTE — Progress Notes (Addendum)
  Speech Language Pathology Treatment: Dysphagia  Patient Details Name: Renelle Stegenga MRN: [id number] DOB: [birth year] Today's Date: 08/06/2018 Time: 3818-2993 SLP Time Calculation (min) (ACUTE ONLY): 21 min  Assessment / Plan / Recommendation Clinical Impression  Pt's voice sounds clear and she reports that it has improved since extubation, now near her baseline. Per chart review, note that her diet had been liberated to include thin liquids on previous date, at which time she was opting for DNR and acceptance of aspiration risk. Today, this has been reversed back to full code, and palliative care provider Elmo Putt) has requested that we reassess her safety with thin liquids.   Pt was resistant to sitting upright for trials and resistant to trials themselves, insistent that she would only drink thin liquids. She did ultimately participate in a three-ounce water screen, but this unfortunately resulted in coughing, which is concerning for possible aspiration, particularly given penetration of thin liquids noted on MBS. Penetration on MBS was trace and there was no aspiration observed, but she could not use a volitional cough consistently to clear penetrates from her laryngeal vestibule secondary to pain s/p CPR. She tells me today that she will not use a cough post-swallow as a compensatory measure to increase safety with thin liquids.   Given that she is not open to strategies, risk for aspiration would be present with thin liquids. However, she would be at a high risk for dehydration or noncompliance with diet modification as well. She could be appropriate for accepting aspiration risk with thin liquids if inline with overall GOC, but will likely need more input from palliative care team in order to determine if this is the case.  After discussion with Elmo Putt, will leave pt on thin liquids for now with careful monitoring, as they had an extensive conversation about aspiration already. She plans to  discuss this with her further as well.   HPI HPI: On 7/16 pt was intubated for a third time with extubation on 7/21.  Mucous plugging removed with bronchoscopy. Pt has had CPR twice during this admission. RN reports improving cough and vocal quality from 7/21 PM to 7/22 AM.      SLP Plan  Continue with current plan of care       Recommendations  Diet recommendations: Regular;Nectar-thick liquid(could do thin liquids depending on overall GOC) Liquids provided via: Cup Medication Administration: Whole meds with liquid Supervision: Patient able to self feed;Intermittent supervision to cue for compensatory strategies Compensations: Slow rate;Small sips/bites Postural Changes and/or Swallow Maneuvers: Seated upright 90 degrees                Oral Care Recommendations: Oral care BID SLP Visit Diagnosis: Dysphagia, oropharyngeal phase (R13.12) Plan: Continue with current plan of care       GO                Venita Sheffield Brysten Reister 08/06/2018, 2:59 PM  Pollyann Glen, M.A. [place] Acute Environmental education officer [phone] Office [phone]

## 2018-08-06 NOTE — TOC Initial Note (Signed)
Transition of Care [facility]) - Initial/Assessment Note    Patient Details  Name: [name] MRN: [id number] Date of Birth: 1950/10/18  Transition of Care [facility]) CM/SW Contact:    Eileen Stanford, LCSW Phone Number: 08/06/2018, 9:55 AM  Clinical Narrative:  CSW spoke with pt at bedside. Pt is alert and oriented, although at some points during conversation pt would state "I don't remember." Pt is agreeable to SNF at d/c. Pt would like to go somewhere in [place] or [place]. Pt agreeable to f/o. CSW to follow up with bed offers.          Expected Discharge Plan: Skilled Nursing Facility Barriers to Discharge: Continued Medical Work up   Patient Goals and CMS Choice Patient states their goals for this hospitalization and ongoing recovery are:: "to be normal"   Choice offered to / list presented to : Patient  Expected Discharge Plan and Services Expected Discharge Plan: [place] In-house Referral: NA Discharge Planning Services: CM Consult Post Acute Care Choice: [place] Living arrangements for the past 2 months: Single Family Home                           HH Arranged: NA          Prior Living Arrangements/Services Living arrangements for the past 2 months: Single Family Home Lives with:: Self Patient language and need for interpreter reviewed:: Yes Do you feel safe going back to the place where you live?: Yes      Need for Family Participation in Patient Care: Yes (Comment) Care giver support system in place?: Yes (comment)   Criminal Activity/Legal Involvement Pertinent to Current Situation/Hospitalization: No - Comment as needed  Activities of Daily Living Home Assistive Devices/Equipment: Oxygen ADL Screening (condition at time of admission) Patient's cognitive ability adequate to safely complete daily activities?: Yes Is the patient deaf or have difficulty hearing?: No Does the patient have difficulty seeing, even when  wearing glasses/contacts?: No Does the patient have difficulty concentrating, remembering, or making decisions?: No Patient able to express need for assistance with ADLs?: Yes Does the patient have difficulty dressing or bathing?: No Independently performs ADLs?: Yes (appropriate for developmental age) Does the patient have difficulty walking or climbing stairs?: Yes Weakness of Legs: Both Weakness of Arms/Hands: Both  Permission Sought/Granted   Permission granted to share information with : Yes, Verbal Permission Granted  Share Information with NAME: [name]     Permission granted to share info w Relationship: Daughter     Emotional Assessment Appearance:: Appears stated age Attitude/Demeanor/Rapport: Engaged Affect (typically observed): Accepting, Appropriate Orientation: : Oriented to Self, Oriented to Place, Oriented to  Time, Oriented to Situation Alcohol / Substance Use: Not Applicable Psych Involvement: No (comment)  Admission diagnosis:  Cardiac arrest ([place]) [I46.9] Hypovolemic shock ([place]) [R57.1] Anticoagulated [Z79.01] Encounter for intubation [Z01.818] Gastrointestinal hemorrhage, unspecified gastrointestinal hemorrhage type [K92.2] Hematemesis, presence of nausea not specified [K92.0] Patient Active Problem List   Diagnosis Date Noted  . Endobronchial mass   . GI bleed 07/17/2018  . UGI bleed 07/17/2018  . Cardiac arrest ([place])   . Hematemesis   . Elevated troponin   . Malnutrition of moderate degree 05/24/2018  . Acute on chronic respiratory failure ([place]) 05/23/2018  . Melena 05/23/2018  . 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 Depression 05/23/2018  . Chronic pain syndrome 05/23/2018  . Hypercapnia 05/23/2018  . Acute on chronic respiratory failure with hypoxia and hypercapnia (  [place]) 01/29/2018  . Acute and chronic respiratory failure (acute-on-chronic) ([place]) 01/29/2018  . Pressure injury of skin 01/29/2018  . Hyperglycemia 01/21/2018  . COPD with  exacerbation ([place]) 01/21/2018  . Vertebral fracture, osteoporotic ([place]) 01/20/2018  . Rectal bleeding 09/05/2017  . Left lower quadrant pain 09/05/2017  . Oxygen dependent 09/05/2017  . Chronic back pain 05/16/2017  . COPD exacerbation ([place]) 02/19/2017  . Tachycardia 02/19/2017  . Influenza A 02/19/2017  . Acute respiratory failure ([place]) 02/19/2017  . Dyspnea 11/26/2014  . Loss of weight 11/26/2014  . Respiratory failure requiring intubation ([place]) 08/27/2013  . Chest pain 08/24/2013  . Encounter for therapeutic drug monitoring 02/13/2013  . Abnormal weight loss 03/21/2012  . Cerebrovascular disease 11/15/2011  . CAD S/P percutaneous coronary angioplasty   . Factor V Leiden, prothrombin gene mutation ([place])   . Hyperlipidemia   . Chronic anticoagulation   . Hypothyroidism 11/23/2006  . TOBACCO ABUSE 11/23/2006  . History of pulmonary embolus (PE) 11/23/2006  . 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/2006   PCP:  [name], NP Pharmacy:   [place], [place] [address] [place] [address] Phone: [phone] Fax: [place], [place] [address] Phone: [phone] Fax: [phone]     Social Determinants of Health (SDOH) Interventions    Readmission Risk Interventions Readmission Risk Prevention Plan 08/06/2018 07/19/2018 05/29/2018  Transportation Screening Complete - Complete  Medication Review (RN Care Manager) Complete Referral to Pharmacy Complete  PCP or Specialist appointment within 3-5 days of discharge Complete Not Complete -  PCP/Specialist Appt Not Complete comments - Continued medical workup -  HRI or Home Care Consult Complete Complete Complete  SW Recovery Care/Counseling Consult Complete Complete Complete  Palliative Care Screening Not Applicable Not Applicable Not Applicable  Skilled Nursing  Facility Complete Complete Patient Refused  Some recent data might be hidden

## 2018-08-06 NOTE — Progress Notes (Addendum)
Palliative:  HPI:68 [place] has 1 daughter [name] and also has a son(estranged). She states that she lives by herself. She has past medical history significant for COPD for which she sees outpatient pulmonary specialist, also has a history of coronary artery disease for which she has followed up with outpatient cardiology. She has history of hypertension PE and factor V Leiden and is maintained on oral anticoagulation in the outpatient setting. Patient presented [facility] to [facility] with large volume hematemesis and underwent a PEA arrest shortly thereafterwith second PEA arrest 07/25/18. She was resuscitated and transferred to [facility]. She has been seen gastroenterologist, pulmonary medicine and critical care medicine. This has been a prolonged and complicated hospital course. She has required several episodes of intubations and extubations. She has been found to have mucous plug and lung collapse for which she required thoracenteses and bronchoscopy. She had mucous plug causing mainstem bronchus blockage. She continues to have weakness, ongoing symptoms of chest pain,and is not eating well. She continues with chest pain but her eating has improved although her weakness and motivation continues to be poor. Reversed DNR to FULL code after speaking with family evening of 08/05/18.   I met again today with [name]. I explained to her that I understand that there have been many changes since we spoke yesterday. [name] tells me that there have been. She goes on to explain that her daughter, [name], will not "let her" be DNR and I explained that this is her decision and not her daughter's decision. I go on to tell her that I have explained to her daughter her wishes and we are willing to follow her wishes but she has to be willing to stand firm on these wishes if this is what she really desires. [name] confirms to me that she  would like to maintain full code and full aggressive care if this is what her daughter wants for her. She tells me that she does not really want this but is willing to do this for her daughter.   I further explained that we need to discuss what this means. [name] was confronted with the decision to remove life support this admission and was not ready and able to do this. I fear that [name] would struggle to make these decisions and [name] needs to make these desires known. [name] is firm that she is willing for trial of ventilator support but would not want long term support. We further defined long term as > 2 weeks and need for tracheostomy. She does NOT want tracheostomy - "let me go." RN Colletta Maryland at bedside and witnessed these wishes. I encouraged [name] that she needs to document these wishes and take this burden off of her daughter and she agrees.   I also discussed with [name] given her desire for full aggressive care we need to re-evaluate safety of thin liquids. She seems to be tolerating per RN but I recommend SLP to evaluate for recommendations and [name] agrees with this plan. I am hoping that she will do better with thin liquids as this is her desire to have thin liquids.   All questions/concerns addressed. Emotional support provided.   Exam: Alert, oriented. No distress. Central chest pain to touch (likely s/t CPR). Breathing regular, unlabored. Abd flat. Generalized weakness.   Plan: - Full code, full aggressive care.  - Will work on documentation of wishes (in particular NO tracheostomy) - SLP to f/u for recs  30 min  Elmo Putt  Jerline Pain, NP Palliative Medicine Team Pager [phone] (Please see [email or web address] for schedule) Team Phone [phone]    Greater than 50%  of this time was spent counseling and coordinating care related to the above assessment and plan

## 2018-08-06 NOTE — Progress Notes (Signed)
PROGRESS NOTE  [name] MAU:[id number] DOB: 21-Oct-1950 DOA: Jul 31, 2018 PCP: [name], NP  HPI/Recap of past 24 hours: 68 yr old withCOPD, MI, HTN, and PE,Factor V Leiden onWarfarin(also on Brillinta)presented with large volumehematemesison late hrs of 07-31-18. Coded in [facility] ED for [place]. Coagulopathy reversed with Vit K, [place].Transferred to [place] [facility]. Received transfusions and GI consulted for EGD ( wanted pt to be off AC for 5 days).Extubatedon 7/9and transferred out to Internal Medicine service.Back to ICU 7/11 for mucus plug and left lung collapses/p thoracentesis was re-intubated and has a bronchoscopy on 7/11/ showed the whole left mainstem bronchus was occluded by mucous plug and thick secretions which extended all the way into the lower lobe. It was suctioned out and BAL was sent which was positive for Stenotrophomonas . Repeat Bronchosopy on 7/13 prior to extubation was negative. Extubated successfully on 7/13 and downgraded to Internal Medicine.  PCCM re-engaged 7/16 for increasing oxygen demand. Clinically appears to be pulm edema.Plan fordiuresisand aim to support, hopefully avoid intubation. Evening of 7/16 experienced PEA arrest. Got epi, atropine, and dopamine. Intubated and transferred to the ICU for further evaluation and management. Repeat bronchoscopy 7/17.  And transfer back to [facility] on 08/01/2018.  08/05/18: Patient was seen and examined at her bedside.  No acute events overnight.  No hemoptysis or hematemesis.  Reports sharp pain on mid chest that is reproducible on palpation.  No other complaints.  INR subtherapeutic 1.1, Coumadin bridged with heparin drip.  08/06/18: Patient seen and examined at bedside.  No acute events overnight.  States she feels constipated.  States she does not feel like working with physical therapy today.  Assessment/Plan: Active Problems:   Respiratory failure requiring intubation (HCC)   GI bleed  UGI bleed   Cardiac arrest (HCC)   Endobronchial mass  Acute on chronic hypoxic respiratory failure post extubation likely secondary to severe left atelectasis and left lung collapse She has had prior two intubations followed by extubation and 2 bronchoscopies during this hospitalization. Continue to maintain O2 saturation greater than 90% Continue nebs Currently on 3 L of oxygen by nasal cannula saturating 98%.  Factor V Le[place] on Coumadin INR subtherapeutic 1.1 on 08/06/2018 Continue bridging with heparin drip Pharmacy managing.  Highly appreciated.  Paroxysmal A. fib In sinus rhythm Rate controlled Subtherapeutic on Coumadin Coumadin bridge with heparin drip  Mid chest tenderness with palpation possibly related to recent chest compression with cardiac arrest Closely monitor on telemetry and treat symptomatically   Acute blood loss anemia from GI bleed Hemoglobin stable at 8.8 on 08/05/2018 from 8.1 from 7.6 yesterday, MCV 97 No sign of overt bleeding at this time We will continue to monitor H&H  Recent massive GI bleed on Coumadin and Brilinta Prior hematemesis Continue PPI Currently stable  Recent cardiac arrest x2 07-31-2018 [facility] and 07/25/18 [facility] Closely monitor on telemetry Now DNR  Left mainstem bronchus Mucous plug/left lung collapse post thoracentesis 07/20/18 (320cc) and bronchoscopy Continue flutter valve Encourage cough and airway clearance  Coronary artery disease Denies anginal symptoms On Brilinta  Hypothyroidism-continue levothyroxine  Generalized weakness/physical debility Patient not receptive to PT Fall precautions  Chronic anxiety/depression Continue home medication  Goals of care Palliative care team following.  DNR changed to full code on 08/06/2018    Code Status: DNR  Family Communication: We will call family if okay with the patient  Disposition Plan: Home possibly in 1 to 2 days when INR is therapeutic   Consultants:  None  Procedures:  Intubated 07/16/2018 and extubated 07/18/2018  Left-sided thoracentesis on 07/20/2018  Bronchoscopy on 07/20/2018  Reintubated on 07/20/2018 and extubated on 07/22/2018  Cardiac arrest on 07/25/2018.  Repeat bronchoscopy on 07/26/2018   Antimicrobials:  None   DVT prophylaxis: Coumadin bridged with heparin.   Objective: Vitals:   08/06/18 0425 08/06/18 0645 08/06/18 0807 08/06/18 0837  BP: (!) 159/77  110/60 (!) 115/51  Pulse: 85  85 86  Resp: _0 Temp: 98.5 F (36.9 C)   98.8 F (37.1 C)  TempSrc: Oral   Oral  SpO2: 100%  100% 100%  Weight:  61.1 kg    Height:        Intake/Output Summary (Last 24 hours) at 08/06/2018 1548 Last data filed at 08/06/2018 1000 Gross per 24 hour  Intake 1335.98 ml  Output 100 ml  Net 1235.98 ml   Filed Weights   08/03/18 0241 08/05/18 0621 08/06/18 0645  Weight: 60.2 kg 61.6 kg 61.1 kg    Exam:  . General: 68 y.o. year-old female well developed well nourished in no acute distress.  Alert and interactive. . Cardiovascular: Regular rate and rhythm no rubs or gallops no JVD or thyromegaly noted.  Tenderness on palpation of the chest.   . Respiratory: Clear to auscultation no wheezes no rales.  Poor inspiratory effort.   . Abdomen: Soft obese nontender nondistended normal bowel sounds present. .   Musculoskeletal: Trace lower extremity edema.   [name] Psychiatry: Mood is appropriate for condition and setting.  Data Reviewed: CBC: Recent Labs  Lab 08/02/18 0453 08/03/18 0420 08/04/18 0919 08/05/18 0411 08/06/18 0630  WBC 4.0 4.2 5.4 5.2 4.9  HGB 7.8* 7.6* 8.1* 8.8* 8.4*  HCT 24.6* 23.7* 26.5* 27.8* 27.3*  MCV 97.6 94.0 97.8 96.2 99.3  PLT 264 253 262 229 025   Basic Metabolic Panel: Recent Labs  Lab 07/31/18 0434 08/01/18 0425 08/02/18 1125 08/03/18 0420  NA 133* 132* 130* 130*  K 5.4* 5.3* 5.0 4.8  CL 97* 92* 91* 90*  CO2 _1 GLUCOSE 81 84 95 88  BUN _2 CREATININE 0.91 1.00 1.04* 1.15*   CALCIUM 8.7* 9.0 9.0 8.8*  MG 1.6* 1.7 1.6* 1.7  PHOS 4.8* 4.3  --   --    GFR: Estimated Creatinine Clearance: 40.4 mL/min (A) (by C-G formula based on SCr of 1.15 mg/dL (H)). Liver Function Tests: No results for input(s): AST, ALT, ALKPHOS, BILITOT, PROT, ALBUMIN in the last 168 hours. No results for input(s): LIPASE, AMYLASE in the last 168 hours. No results for input(s): AMMONIA in the last 168 hours. Coagulation Profile: Recent Labs  Lab 08/02/18 0453 08/03/18 0420 08/04/18 0919 08/05/18 0411 08/06/18 0630  INR 1.1 1.2 1.2 1.1 1.1   Cardiac Enzymes: No results for input(s): CKTOTAL, CKMB, CKMBINDEX, TROPONINI in the last 168 hours. BNP (last 3 results) No results for input(s): PROBNP in the last 8760 hours. HbA1C: No results for input(s): HGBA1C in the last 72 hours. CBG: No results for input(s): GLUCAP in the last 168 hours. Lipid Profile: No results for input(s): CHOL, HDL, LDLCALC, TRIG, CHOLHDL, LDLDIRECT in the last 72 hours. Thyroid Function Tests: No results for input(s): TSH, T4TOTAL, FREET4, T3FREE, THYROIDAB in the last 72 hours. Anemia Panel: No results for input(s): VITAMINB12, FOLATE, FERRITIN, TIBC, IRON, RETICCTPCT in the last 72 hours. Urine analysis:    Component Value Date/Time   COLORURINE YELLOW 05/23/2018 [place]  05/23/2018 0934   LABSPEC 1.014 05/23/2018 [place] 7.0 05/23/2018 0934   GLUCOSEU NEGATIVE 05/23/2018 0934   HGBUR SMALL (A) 05/23/2018 0934   BILIRUBINUR NEGATIVE 05/23/2018 0934   BILIRUBINUR n 09/21/2016 1432   KETONESUR NEGATIVE 05/23/2018 0934   PROTEINUR 30 (A) 05/23/2018 0934   UROBILINOGEN 0.2 09/21/2016 1432   UROBILINOGEN 0.2 04/13/2009 2025   NITRITE NEGATIVE 05/23/2018 0934   LEUKOCYTESUR NEGATIVE 05/23/2018 0934   Sepsis Labs: _0 (procalcitonin:4,lacticidven:4)  )No results found for this or any previous visit (from the past 240 hour(s)).    Studies: No results found.   Scheduled Meds: . albuterol  2.5 mg Nebulization BID  . arformoterol  15 mcg Nebulization BID  . budesonide (PULMICORT) nebulizer solution  0.5 mg Nebulization BID  . buPROPion  50 mg Oral BID  . citalopram  10 mg Oral Daily  . glycopyrrolate  0.2 mg Intravenous BID  . guaiFENesin  600 mg Oral BID  . levothyroxine  125 mcg Oral Q0600  . lidocaine  1 patch Transdermal Q24H  . mouth rinse  15 mL Mouth Rinse BID  . pantoprazole  40 mg Oral BID  . polyethylene glycol  17 g Oral Daily  . senna  2 tablet Oral BID  . sodium chloride flush  10-40 mL Intracatheter Q12H  . ticagrelor  60 mg Oral BID  . warfarin  5 mg Oral ONCE-1800  . Warfarin - Pharmacist Dosing Inpatient   Does not apply q1800    Continuous Infusions: . sodium chloride 10 mL/hr at 07/31/18 1800  . heparin 1,000 Units/hr (08/06/18 0100)     LOS: 20 days     Kayleen Memos, MD [facility] Pager [phone]  If 7PM-7AM, please contact night-coverage [email or web address] Password [facility] 08/06/2018, 3:48 PM

## 2018-08-06 NOTE — Progress Notes (Signed)
ANTICOAGULATION CONSULT NOTE - Follow Up Consult  Pharmacy Consult for Heparin   Indication: atrial fibrillation  Patient Measurements: Height: 5\' 4"  (162.6 cm) Weight: 134 lb 11.2 oz (61.1 kg) IBW/kg (Calculated) : 54.7  Vital Signs: Temp: 98.8 F (37.1 C) (07/28 0837) Temp Source: Oral (07/28 0837) BP: 115/51 (07/28 0837) Pulse Rate: 86 (07/28 0837)  Labs: Recent Labs    08/04/18 0919  08/05/18 0411 08/06/18 0630 08/06/18 0923  HGB 8.1*  --  8.8* 8.4*  --   HCT 26.5*  --  27.8* 27.3*  --   PLT 262  --  229 228  --   LABPROT 14.8  --  13.6 13.9  --   INR 1.2  --  1.1 1.1  --   HEPARINUNFRC 0.29*   < > 0.34 0.76* 0.37   < > = values in this interval not displayed.    Estimated Creatinine Clearance: 40.4 mL/min (A) (by C-G formula based on SCr of 1.15 mg/dL (H)).  Assessment: 68 yr old female with hx PE/DVT in setting of  Factor V Leiden on warfarin PTA which was reversed with [place] and Vitamin K on 07/17/18 d/t coagulapathy. No current plans for EGD per GI so CCM ordered heparin to start on 7/13. Restarted warfarin with heparin bridge. After discussion with MD, ok to increase heparin goal from 0.15-0.2 given resolution of bleeding and restarting warfarin.   Warfarin regimen PTA: 5 mg MWFSun, 6 mg TTSat (1 mg tablet).   Heparin level of 0.76 falsely elevated as level was drawn from PICC line rather than a peripheral stick. Upon repeat level, heparin level remains therapeutic at 0.37 (goal of 0.2-0.5). INR remains SUBtherapeutic at 1.1 (goal of 2-3). Hgb/Hct are stable at 8.4/27.3 and plts are stable at 228. No signs of bleeding or infusion issues per nursing.   Goal of Therapy:  HL 0.2-0.5 units/mL INR 2-3 Monitor platelets by anticoagulation protocol: Yes   Plan:  - Continue heparin gtt at 1000 units/hr - Warfarin 5 mg x 1 dose at 1800 today - Will continue to monitor for any signs/symptoms of bleeding and will follow up with heparin level and PT/INR in the a.m.     Thank you for allowing pharmacy to be a part of this patient's care.  Eddie Candle, PharmD PGY-1 Pharmacy Resident  Phone: [phone]

## 2018-08-07 LAB — HEPARIN LEVEL (UNFRACTIONATED): Heparin Unfractionated: 0.22 IU/mL — ABNORMAL LOW (ref 0.30–0.70)

## 2018-08-07 LAB — CBC
HCT: 27.7 % — ABNORMAL LOW (ref 36.0–46.0)
Hemoglobin: 8.4 g/dL — ABNORMAL LOW (ref 12.0–15.0)
MCH: 30.2 pg (ref 26.0–34.0)
MCHC: 30.3 g/dL (ref 30.0–36.0)
MCV: 99.6 fL (ref 80.0–100.0)
Platelets: 225 10*3/uL (ref 150–400)
RBC: 2.78 MIL/uL — ABNORMAL LOW (ref 3.87–5.11)
RDW: 15.5 % (ref 11.5–15.5)
WBC: 5 10*3/uL (ref 4.0–10.5)
nRBC: 0 % (ref 0.0–0.2)

## 2018-08-07 LAB — PROTIME-INR
INR: 1.2 (ref 0.8–1.2)
Prothrombin Time: 15.3 seconds — ABNORMAL HIGH (ref 11.4–15.2)

## 2018-08-07 MED ORDER — MAGNESIUM HYDROXIDE 400 MG/5ML PO SUSP
15.0000 mL | Freq: Every day | ORAL | Status: DC
  Administered 2018-08-07: 15 mL via ORAL
  Filled 2018-08-07 (×2): qty 30

## 2018-08-07 MED ORDER — POLYETHYLENE GLYCOL 3350 17 G PO PACK
17.0000 g | PACK | Freq: Two times a day (BID) | ORAL | Status: DC
  Administered 2018-08-07 – 2018-08-09 (×4): 17 g via ORAL
  Filled 2018-08-07 (×5): qty 1

## 2018-08-07 MED ORDER — WARFARIN SODIUM 7.5 MG PO TABS
7.5000 mg | ORAL_TABLET | Freq: Once | ORAL | Status: AC
  Administered 2018-08-07: 7.5 mg via ORAL
  Filled 2018-08-07: qty 1

## 2018-08-07 NOTE — Progress Notes (Signed)
AD was notarized after reviewed w/ pt to confirm.    Original and 2 copies returned to pt, another copy placed by chaplain in pt's shadow chart on [place].  Myra Gianotti resident

## 2018-08-07 NOTE — Progress Notes (Signed)
ANTICOAGULATION CONSULT NOTE - Follow Up Consult  Pharmacy Consult for Heparin   Indication: atrial fibrillation  Patient Measurements: Height: 5\' 4"  (162.6 cm) Weight: 135 lb 2.3 oz (61.3 kg) IBW/kg (Calculated) : 54.7  Vital Signs: Temp: 98 F (36.7 C) (07/29 0650) Temp Source: Oral (07/29 0650) BP: 122/63 (07/29 0650) Pulse Rate: 87 (07/29 0650)  Labs: Recent Labs    08/05/18 0411 08/06/18 0630 08/06/18 0923 08/07/18 0501 08/07/18 0502  HGB 8.8* 8.4*  --   --  8.4*  HCT 27.8* 27.3*  --   --  27.7*  PLT 229 228  --   --  225  LABPROT 13.6 13.9  --   --  15.3*  INR 1.1 1.1  --   --  1.2  HEPARINUNFRC 0.34 0.76* 0.37 0.22*  --     Estimated Creatinine Clearance: 40.4 mL/min (A) (by C-G formula based on SCr of 1.15 mg/dL (H)).  Assessment: 68 yr old female with hx PE/DVT in setting of  Factor V Leiden on warfarin PTA which was reversed with [place] and Vitamin K on 07/17/18 d/t coagulapathy. No current plans for EGD per GI so CCM ordered heparin to start on 7/13. Restarted warfarin with heparin bridge. After discussion with MD, ok to increase heparin goal from 0.15-0.2 given resolution of bleeding and restarting warfarin.   Warfarin regimen PTA: 5 mg MWFSun, 6 mg TTSat (1 mg tablet).    Today's heparin level is therapeutic at 0.22 on 1000 units/hour (goal of 0.2-0.5). INR remains SUBtherapeutic at 1.2 (goal of 2-3), but is slightly increased from yesterday's INR of 1.1.   Hgb/Hct are stable at 8.4/27.7 and plts are stable at 225. No signs of bleeding or infusion issues per nursing.   Goal of Therapy:  HL 0.2-0.5 units/mL INR 2-3 Monitor platelets by anticoagulation protocol: Yes   Plan:  - Continue heparin gtt at 1000 units/hr - Warfarin 7.5 mg x 1 dose at 1800 today - Will continue to monitor for any signs/symptoms of bleeding and will follow up with heparin level and PT/INR in the a.m.    Thank you for allowing pharmacy to be a part of this patient's  care.  Eddie Candle, PharmD PGY-1 Pharmacy Resident  Phone: [phone]

## 2018-08-07 NOTE — Progress Notes (Addendum)
   08/07/18 1500  Clinical Encounter Type  Visited With Patient  Visit Type Initial;Spiritual support  Referral From Palliative care team;Chaplain  Spiritual Encounters  Spiritual Needs Emotional;Other (Comment) (AD)  Stress Factors  Patient Stress Factors Loss of control;Family relationships;Exhausted;Health changes;Major life changes   Pt had spoken w/ PMT about not wanting to be on "life support" for greater than 2 weeks.  She also does not want trach.  Consulted w/ PMT, RN Melanie who spoke w/ Elmo Putt, to confirm way pt had filled it out w/ light of pt's desires.  Pt very concerned that she get her own say in how she continues or not to receive medical care.  States that her daughter won't like what pt wants and concerned that daughter may try to contest decisions pt stipulates.    Pt very concerned about the levels of pain and multiple ways that her body is failing her.  It is important to her that she remain independent and get to make her own decisions.  Questions why she has to keep going through very difficult medical situations.  [place] resident, [phone]  See other note for rest of AD info.  One extended visit in total.

## 2018-08-07 NOTE — Progress Notes (Signed)
Nutrition Follow-up  DOCUMENTATION CODES:   Non-severe (moderate) malnutrition in context of chronic illness  INTERVENTION:   -Continue Magic cup TID with meals, each supplement provides 290 kcal and 9 grams of protein  NUTRITION DIAGNOSIS:   Moderate Malnutrition related to chronic illness(COPD) as evidenced by mild fat depletion, mild muscle depletion, moderate muscle depletion, percent weight loss(16.9% weight loss in 5 months).  Ongoing.  GOAL:   Patient will meet greater than or equal to 90% of their needs  Progressing.  MONITOR:   PO intake, Supplement acceptance, Diet advancement, Labs, I & O's, Weight trends  ASSESSMENT:   68 year old female who presented to the ED on 7/07 with large volume hematemesis. Pt became unresponsive in PEA arrest and CPR initiated. ROSC was achieved. Pt required intubation in the ED. PMH of MI, factor V Leiden, COPD. TTM not pursued as pt was following commands post arrest.  7/09 - extubated, clear liquid diet 7/11 - returned to ICU for mucus plug, right lung collapse, s/p thoracentesis, bronchoscopy revealing RML mass 7/13 - bronchoscopy, extubated 7/14 - diet briefly advanced to Dysphagia 3 then back to NPO 7/16 - 2 episodes of respiratory distress, chest x-rays revealing pulmonary edema, hemoptysis, Code Blue due to PEA arrest x 2, intubated, no TTM 7/17 - bronchoscopy revealing left mainstem obstructed with blood clot and mucus 7/20 - bronchoscopy with clot removal of left mainstem 7/21 - extubated 7/22 - MBS, diet advanced to Heart healthy with nectar-thick liquids  **RD working remotely**  Patient currently consuming 100% of meals. SLP approved for patient to have thin liquids 7/28. Palliative care following for GOC.   Per weight records, weight has decreased -11 lbs since 7/19.  Labs reviewed. Medications reviewed.  Diet Order:   Diet Order            Diet Heart Room service appropriate? No; Fluid consistency: Thin  Diet  effective now              EDUCATION NEEDS:   No education needs have been identified at this time  Skin:  Skin Assessment: Reviewed RN Assessment Skin Integrity Issues:: DTI DTI: buttocks  Last BM:  07/29/18  Height:   Ht Readings from Last 1 Encounters:  07/16/18 5\' 4"  (1.626 m)    Weight:   Wt Readings from Last 1 Encounters:  08/07/18 61.3 kg    Ideal Body Weight:  54.5 kg  BMI:  Body mass index is 23.2 kg/m.  Estimated Nutritional Needs:   Kcal:  1700-1900  Protein:  85-100 grams  Fluid:  >/=1.7 L  Clayton Bibles, MS, RD, LDN [place] Dietitian Pager: [phone] After Hours Pager: [phone]

## 2018-08-07 NOTE — TOC Progression Note (Signed)
Transition of Care [facility]) - Progression Note    Patient Details  Name: [name] MRN: [id number] Date of Birth: [birth year]  [facility] CM/SW Contact  Eileen Stanford, LCSW Phone Number: 08/07/2018, 11:39 AM  Clinical Narrative:   Pt has agreed to [employer] in [place]. CSW to reach out to SNF to determine if they can start insurance auth.    Expected Discharge Plan: [place] Barriers to Discharge: Continued Medical Work up  Expected Discharge Plan and Services Expected Discharge Plan: [place] In-house Referral: NA Discharge Planning Services: CM Consult Post Acute Care Choice: [place] Living arrangements for the past 2 months: Single Family Home                           HH Arranged: NA           Social Determinants of Health (SDOH) Interventions    Readmission Risk Interventions Readmission Risk Prevention Plan 08/06/2018 07/19/2018 05/29/2018  Transportation Screening Complete - Complete  Medication Review (RN Care Manager) Complete Referral to Pharmacy Complete  PCP or Specialist appointment within 3-5 days of discharge Complete Not Complete -  PCP/Specialist Appt Not Complete comments - Continued medical workup -  HRI or Home Care Consult Complete Complete Complete  SW Recovery Care/Counseling Consult Complete Complete Complete  Palliative Care Screening Not Applicable Not Applicable Not Applicable  Skilled Nursing Facility Complete Complete Patient Refused  Some recent data might be hidden

## 2018-08-07 NOTE — Progress Notes (Signed)
  Speech Language Pathology Treatment: Dysphagia  Patient Details Name: [name] MRN: [id number] DOB: 07-20-1950 Today's Date: 08/07/2018 Time: 9935-7017 SLP Time Calculation (min) (ACUTE ONLY): 10 min  Assessment / Plan / Recommendation Clinical Impression  Pt continues to deny any difficulties with thin liquids or regular solids. She is afebrile and her lungs remain clear per documentation. SLP provided Min cues for use of aspiration precautions during PO intake, primarily due to pt not wanting to sit upright out of fear of pain in doing so. She has no outward signs of aspiration, although this does not mean she is not at risk given potential for silent aspiration per MBS. Education was reinforced about aspiration precautions, swallowing strategies, and the importance of oral care to reduce oral bacterial load. Will f/u briefly for additional education.   HPI HPI: On 7/16 pt was intubated for a third time with extubation on 7/21.  Mucous plugging removed with bronchoscopy. Pt has had CPR twice during this admission. RN reports improving cough and vocal quality from 7/21 PM to 7/22 AM.      SLP Plan  Continue with current plan of care       Recommendations  Diet recommendations: Regular;Thin liquid(per pt wishes and discussed with NP on previous date) Liquids provided via: Cup;Straw Medication Administration: Whole meds with liquid Supervision: Patient able to self feed;Intermittent supervision to cue for compensatory strategies Compensations: Slow rate;Small sips/bites Postural Changes and/or Swallow Maneuvers: Seated upright 90 degrees                Oral Care Recommendations: Oral care BID Follow up Recommendations: None SLP Visit Diagnosis: Dysphagia, oropharyngeal phase (R13.12) Plan: Continue with current plan of care       GO                Venita Sheffield Kyon Bentler 08/07/2018, 3:06 PM  Pollyann Glen, M.A. [place] Acute Environmental education officer  [phone] Office [phone]

## 2018-08-07 NOTE — Progress Notes (Signed)
This chaplain attempted spiritual care visit to complete PMT consult for AD.  Pt. sleeping at time of chaplain visit and didn't awaken to the call of her name. The chaplain will F/U with spiritual care as needed.

## 2018-08-07 NOTE — Progress Notes (Signed)
Physical Therapy Treatment Patient Details Name: [name] MRN: [id number] DOB: 15-Aug-1950 Today's Date: 08/07/2018    History of Present Illness 68 year old with Factor V Leiden on warfarin presented with hematemesis. Coded in [facility] ED for 12 minutes. Extubated 7/9, back to ICU 7/11 for mucus plug and L lung collapse s/p thoracentesis and reintubated 7/11-7/13. 7/16 PEA arrest. Intubated and transferred to ICU, repeat broncoscopy 7/17. Extubated 7/21. PMH: COPD, MI, HTN,anteriosclerotic cardiovascular disease, chronic respiratory failure, tobacco use, and PE.     PT Comments    Patient received in bed, finishing lunch. Agrees to bed exercises only. "If you can guarantee me that it won't hurt, then I will do it." referring to sitting up on the side of the bed.  Patient self limited by pain and fear of pain. She reports she lives in pain and she is not going to do it anymore. Tolerated LE exercises well. Continue to progress as able with functional mobility.     Follow Up Recommendations  SNF     Equipment Recommendations  None recommended by PT    Recommendations for Other Services       Precautions / Restrictions Precautions Precautions: Fall Restrictions Weight Bearing Restrictions: No    Mobility  Bed Mobility               General bed mobility comments: patient refuses  Transfers                 General transfer comment: patient refuses  Ambulation/Gait             General Gait Details: patient refuses out of bed activity   Stairs             Wheelchair Mobility    Modified Rankin (Stroke Patients Only)       Balance                                            Cognition Arousal/Alertness: Awake/alert Behavior During Therapy: WFL for tasks assessed/performed(seems depressed, unmotivated, declines OOB due to pain) Overall Cognitive Status: Within Functional Limits for tasks assessed                                 General Comments: somewhat cooperative with bed exercises, but will not do anything else despite encouragement.      Exercises Other Exercises Other Exercises: supine B LE exercises: ap, heel slides, hip abd/add. SLR x 10 reps each    General Comments        Pertinent Vitals/Pain Pain Assessment: 0-10 Pain Score: 7  Pain Location: chest and back Pain Descriptors / Indicators: Aching;Discomfort;Sore;Grimacing Pain Intervention(s): Monitored during session;Repositioned    Home Living                      Prior Function            PT Goals (current goals can now be found in the care plan section) Acute Rehab PT Goals Patient Stated Goal: decrease pain PT Goal Formulation: With patient Time For Goal Achievement: 08/10/18 Potential to Achieve Goals: Fair Progress towards PT goals: Not progressing toward goals - comment(patient declines progression to out of bed activities despite encouragement.)    Frequency    Min 3X/week  PT Plan Current plan remains appropriate    Co-evaluation              AM-PAC PT "6 Clicks" Mobility   Outcome Measure  Help needed turning from your back to your side while in a flat bed without using bedrails?: A Lot Help needed moving from lying on your back to sitting on the side of a flat bed without using bedrails?: A Lot Help needed moving to and from a bed to a chair (including a wheelchair)?: A Lot Help needed standing up from a chair using your arms (e.g., wheelchair or bedside chair)?: Total Help needed to walk in hospital room?: Total Help needed climbing 3-5 steps with a railing? : Total 6 Click Score: 9    End of Session   Activity Tolerance: Patient limited by pain Patient left: in bed;with bed alarm set Nurse Communication: Mobility status PT Visit Diagnosis: Muscle weakness (generalized) (M62.81);Difficulty in walking, not elsewhere classified (R26.2);Pain Pain - part of body:  (back, chest)     Time: 7680-8811 PT Time Calculation (min) (ACUTE ONLY): 10 min  Charges:  $Therapeutic Exercise: 8-22 mins                     Toney Lizaola, PT, GCS 08/07/18,12:58 PM

## 2018-08-07 NOTE — Progress Notes (Addendum)
Palliative:  HPI:68 [place] has 1 daughter [name] and also has a son(estranged). She states that she lives by herself. She has past medical history significant for COPD for which she sees outpatient pulmonary specialist, also has a history of coronary artery disease for which she has followed up with outpatient cardiology. She has history of hypertension PE and factor V Leiden and is maintained on oral anticoagulation in the outpatient setting. Patient presented initially to [facility] with large volume hematemesis and underwent a PEA arrest shortly thereafterwith second PEA arrest 07/25/18. She was resuscitated and transferred to [facility]. She has been seen gastroenterologist, pulmonary medicine and critical care medicine. This has been a prolonged and complicated hospital course. She has required several episodes of intubations and extubations. She has been found to have mucous plug and lung collapse for which she required thoracenteses and bronchoscopy. She had mucous plug causing mainstem bronchus blockage. She continues to have weakness, ongoing symptoms of chest pain,and is not eating well.She continues with chest pain but her eating has improved although her weakness and motivation continues to be poor. Reversed DNR to FULL code after speaking with family evening of 08/05/18.    I met again today with [name]. We discussed thin liquids and she feels like she is tolerating. I again reiterated the potential risks of aspiration and leading to breathing issues, infection, and even ventilator but she is willing to accept risk and continue with thin liquids. This will need to be re-evaluated if she appears to be aspirating. For now I recommend careful monitoring and continued thin liquids as she does appear to be compensating well.   We also discussed HCPOA/Living Will and we discussed all scenarios. She names daughter,  [name], as her HCPOA. She indicates that she would NOT want to be left on life support > 2 weeks and would not want tracheostomy. She indicates she would not want her life prolonged in all 3 scenarios. Would not artificial feeding. Would want to donate her organs. We have clarified that she wishes to remain full code to appease her daughter even though this is not really what she wishes for herself. She believes it is important to document these wishes so she is not on long term ventilator support.   All questions/concerns addressed. Emotional support provided.   Exam: Alert, oriented. No distress. Central chest pain tender to touch (s/t CPR likely). Breathing regular, unlabored. Abd flat. Generalized weakness.   Plan: - FULL code.  - HCPOA/Living Will to be notarized today for completion.  - Palliative care to follow peripherally. Please call for acute palliative needs.   25 min  [name] Sill, NP Palliative Medicine Team Pager [phone] (Please see [email or web address] for schedule) Team Phone [phone]    Greater than 50%  of this time was spent counseling and coordinating care related to the above assessment and plan

## 2018-08-07 NOTE — Progress Notes (Signed)
PROGRESS NOTE  [name] LYY:[id number] DOB: Jan 24, 1950 DOA: Jul 21, 2018 PCP: [name], NP  HPI/Recap of past 24 hours: 68 yr old withCOPD, MI, HTN, and PE,Factor V Leiden onWarfarin(also on Brillinta)presented with large volumehematemesison late hrs of 07-21-2018. Coded in [facility] ED for [place]. Coagulopathy reversed with Vit K, [place]. Received transfusions and GI consulted for EGD ( wanted pt to be off AC for 5 days).Extubatedon 7/9and transferred out to Internal Medicine service.Back to ICU 7/11 for mucus plug and left lung collapses/p thoracentesis was re-intubated and has a bronchoscopy on 7/11/ showed the whole left mainstem bronchus was occluded by mucous plug and thick secretions which extended all the way into the lower lobe. It was suctioned out and BAL was sent which was positive for Stenotrophomonas . Repeat Bronchosopy on 7/13 prior to extubation was negative. Extubated successfully on 7/13 and downgraded to Internal Medicine.  PCCM re-engaged 7/16 for increasing oxygen demand. Clinically appears to be pulm edema.Plan fordiuresisand aim to support, hopefully avoid intubation. Evening of 7/16 experienced PEA arrest. Got epi, atropine, and dopamine. Intubated and transferred to the ICU for further evaluation and management. Repeat bronchoscopy 7/17.  And transfer back to [facility] on 08/01/2018.  08/07/18: Patient was seen and examined at her bedside.  No acute events overnight.  States she feels constipated.  Bowel regimen in place will add Dulcolax and milk of magnesia.    Assessment/Plan: Active Problems:   Respiratory failure requiring intubation (HCC)   GI bleed   UGI bleed   Cardiac arrest (HCC)   Endobronchial mass  Acute on chronic hypoxic respiratory failure post extubation likely secondary to severe left atelectasis and left lung collapse She has had prior two intubations followed by extubation and 2  bronchoscopies during this hospitalization. Continue to maintain O2 saturation greater than 90% Continue nebs Currently on 3 L of oxygen by nasal cannula saturating 98%.  Factor V Leyden on Coumadin INR is subtherapeutic 1.2 on 08/07/2018 Continue bridging with heparin drip Pharmacy managing.  Highly appreciated.  Paroxysmal A. fib In sinus rhythm Rate controlled Subtherapeutic on Coumadin Coumadin bridge with heparin drip  Chronic constipation Continue Senokot 2 tablets twice daily Continue MiraLAX twice daily Add Dulcolax as needed and milk of magnesia  Mid chest tenderness with palpation possibly related to recent chest compression with cardiac arrest Closely monitor on telemetry and treat symptomatically   Acute blood loss anemia from GI bleed Hemoglobin stable at 8.8 on 08/05/2018 from 8.1 from 7.6 yesterday, MCV 97 No sign of overt bleeding at this time We will continue to monitor H&H  Recent massive GI bleed on Coumadin and Brilinta Prior hematemesis Continue PPI Currently stable  Recent cardiac arrest x2 July 21, 2018 [facility] and 07/25/18 [facility] Closely monitor on telemetry Now DNR  Left mainstem bronchus Mucous plug/left lung collapse post thoracentesis 07/20/18 (320cc) and bronchoscopy Continue flutter valve Encourage cough and airway clearance  Coronary artery disease Denies anginal symptoms On Brilinta  Hypothyroidism-continue levothyroxine  Generalized weakness/physical debility Patient not receptive to PT Fall precautions  Chronic anxiety/depression Continue home medication  Goals of care Palliative care team following.  DNR changed to full code on 08/06/2018    Code Status: DNR  Family Communication: We will call family if okay with the patient  Disposition Plan: Home possibly in 1 to 2 days when INR is therapeutic   Consultants:  None   Procedures:  Intubated 21-Jul-2018 and extubated 07/18/2018  Left-sided thoracentesis on 07/20/2018   Bronchoscopy  on 07/20/2018  Reintubated on 07/20/2018 and extubated on 07/22/2018  Cardiac arrest on 07/25/2018.  Repeat bronchoscopy on 07/26/2018   Antimicrobials:  None   DVT prophylaxis: Coumadin bridged with heparin.   Objective: Vitals:   08/06/18 2117 08/07/18 0650 08/07/18 0900 08/07/18 1329  BP: 124/64 122/63 (!) 132/56 124/62  Pulse: (!) 105 87 81 97  Resp: (!) _0 (!) 21  Temp: 98.2 F (36.8 C) 98 F (36.7 C)  99.1 F (37.3 C)  TempSrc: Oral Oral  Oral  SpO2: 92% 97% 98% 97%  Weight:  61.3 kg    Height:        Intake/Output Summary (Last 24 hours) at 08/07/2018 1538 Last data filed at 08/07/2018 1200 Gross per 24 hour  Intake 1700 ml  Output 1500 ml  Net 200 ml   Filed Weights   08/05/18 0621 08/06/18 0645 08/07/18 0650  Weight: 61.6 kg 61.1 kg 61.3 kg    Exam:  . General: 68 y.o. year-old female developed well-nourished in no acute distress.  Alert and interactive.   . Cardiovascular: Regular rate and rhythm.  No rubs or gallops.  No JVD or thyromegaly.  [name] Respiratory: Clear to auscultation no wheezes no rales. Poor inspiratory effort.   . Abdomen: Soft Obese Nontender Nondistended Normal Bowel Sounds Present. .   Musculoskeletal: Trace lower extremity edema.   [name] Psychiatry: Mood is appropriate for condition and setting. .  Data Reviewed: CBC: Recent Labs  Lab 08/03/18 0420 08/04/18 0919 08/05/18 0411 08/06/18 0630 08/07/18 0502  WBC 4.2 5.4 5.2 4.9 5.0  HGB 7.6* 8.1* 8.8* 8.4* 8.4*  HCT 23.7* 26.5* 27.8* 27.3* 27.7*  MCV 94.0 97.8 96.2 99.3 99.6  PLT 253 262 229 228 355   Basic Metabolic Panel: Recent Labs  Lab 08/01/18 0425 08/02/18 1125 08/03/18 0420  NA 132* 130* 130*  K 5.3* 5.0 4.8  CL 92* 91* 90*  CO2 _1 GLUCOSE 84 95 88  BUN _2 CREATININE 1.00 1.04* 1.15*  CALCIUM 9.0 9.0 8.8*  MG 1.7 1.6* 1.7  PHOS 4.3  --   --    GFR: Estimated Creatinine Clearance: 40.4 mL/min (A) (by C-G formula based on SCr of 1.15  mg/dL (H)). Liver Function Tests: No results for input(s): AST, ALT, ALKPHOS, BILITOT, PROT, ALBUMIN in the last 168 hours. No results for input(s): LIPASE, AMYLASE in the last 168 hours. No results for input(s): AMMONIA in the last 168 hours. Coagulation Profile: Recent Labs  Lab 08/03/18 0420 08/04/18 0919 08/05/18 0411 08/06/18 0630 08/07/18 0502  INR 1.2 1.2 1.1 1.1 1.2   Cardiac Enzymes: No results for input(s): CKTOTAL, CKMB, CKMBINDEX, TROPONINI in the last 168 hours. BNP (last 3 results) No results for input(s): PROBNP in the last 8760 hours. HbA1C: No results for input(s): HGBA1C in the last 72 hours. CBG: No results for input(s): GLUCAP in the last 168 hours. Lipid Profile: No results for input(s): CHOL, HDL, LDLCALC, TRIG, CHOLHDL, LDLDIRECT in the last 72 hours. Thyroid Function Tests: No results for input(s): TSH, T4TOTAL, FREET4, T3FREE, THYROIDAB in the last 72 hours. Anemia Panel: No results for input(s): VITAMINB12, FOLATE, FERRITIN, TIBC, IRON, RETICCTPCT in the last 72 hours. Urine analysis:    Component Value Date/Time   COLORURINE YELLOW 05/23/2018 [place] 05/23/2018 0934   LABSPEC 1.014 05/23/2018 0934   PHURINE 7.0 05/23/2018 0934   GLUCOSEU NEGATIVE 05/23/2018 0934   HGBUR SMALL (A) 05/23/2018 7322  BILIRUBINUR NEGATIVE 05/23/2018 0934   BILIRUBINUR n 09/21/2016 1432   KETONESUR NEGATIVE 05/23/2018 0934   PROTEINUR 30 (A) 05/23/2018 0934   UROBILINOGEN 0.2 09/21/2016 1432   UROBILINOGEN 0.2 04/13/2009 2025   NITRITE NEGATIVE 05/23/2018 0934   LEUKOCYTESUR NEGATIVE 05/23/2018 0934   Sepsis Labs: _0 (procalcitonin:4,lacticidven:4)  )No results found for this or any previous visit (from the past 240 hour(s)).    Studies: No results found.  Scheduled Meds: . albuterol  2.5 mg Nebulization BID  . arformoterol  15 mcg Nebulization BID  . budesonide (PULMICORT) nebulizer solution  0.5 mg Nebulization BID  .  buPROPion  50 mg Oral BID  . citalopram  10 mg Oral Daily  . glycopyrrolate  0.2 mg Intravenous BID  . guaiFENesin  600 mg Oral BID  . levothyroxine  125 mcg Oral Q0600  . lidocaine  1 patch Transdermal Q24H  . mouth rinse  15 mL Mouth Rinse BID  . pantoprazole  40 mg Oral BID  . polyethylene glycol  17 g Oral Daily  . senna  2 tablet Oral BID  . sodium chloride flush  10-40 mL Intracatheter Q12H  . ticagrelor  60 mg Oral BID  . warfarin  7.5 mg Oral ONCE-1800  . Warfarin - Pharmacist Dosing Inpatient   Does not apply q1800    Continuous Infusions: . sodium chloride 10 mL/hr at 07/31/18 1800  . heparin 1,000 Units/hr (08/06/18 1601)     LOS: 21 days     Kayleen Memos, MD [facility] Pager [phone]  If 7PM-7AM, please contact night-coverage [email or web address] Password [facility] 08/07/2018, 3:38 PM

## 2018-08-08 LAB — CBC
HCT: 27.7 % — ABNORMAL LOW (ref 36.0–46.0)
Hemoglobin: 8.5 g/dL — ABNORMAL LOW (ref 12.0–15.0)
MCH: 30.6 pg (ref 26.0–34.0)
MCHC: 30.7 g/dL (ref 30.0–36.0)
MCV: 99.6 fL (ref 80.0–100.0)
Platelets: 207 10*3/uL (ref 150–400)
RBC: 2.78 MIL/uL — ABNORMAL LOW (ref 3.87–5.11)
RDW: 15.9 % — ABNORMAL HIGH (ref 11.5–15.5)
WBC: 5.2 10*3/uL (ref 4.0–10.5)
nRBC: 0 % (ref 0.0–0.2)

## 2018-08-08 LAB — PROTIME-INR
INR: 1.4 — ABNORMAL HIGH (ref 0.8–1.2)
Prothrombin Time: 16.7 seconds — ABNORMAL HIGH (ref 11.4–15.2)

## 2018-08-08 LAB — HEPARIN LEVEL (UNFRACTIONATED): Heparin Unfractionated: 0.29 IU/mL — ABNORMAL LOW (ref 0.30–0.70)

## 2018-08-08 MED ORDER — WARFARIN SODIUM 7.5 MG PO TABS
7.5000 mg | ORAL_TABLET | Freq: Once | ORAL | Status: AC
  Administered 2018-08-08: 7.5 mg via ORAL
  Filled 2018-08-08: qty 1

## 2018-08-08 NOTE — Progress Notes (Signed)
ANTICOAGULATION CONSULT NOTE - Follow Up Consult  Pharmacy Consult for Heparin   Indication: atrial fibrillation  Patient Measurements: Height: 5\' 4"  (162.6 cm) Weight: 137 lb 9.1 oz (62.4 kg) IBW/kg (Calculated) : 54.7  Vital Signs: Temp: 98.1 F (36.7 C) (07/30 0651) Temp Source: Oral (07/30 0651) BP: 113/61 (07/30 0651) Pulse Rate: 90 (07/30 0651)  Labs: Recent Labs    08/06/18 0630 08/06/18 0923 08/07/18 0501 08/07/18 0502 08/08/18 0423  HGB 8.4*  --   --  8.4* 8.5*  HCT 27.3*  --   --  27.7* 27.7*  PLT 228  --   --  225 207  LABPROT 13.9  --   --  15.3* 16.7*  INR 1.1  --   --  1.2 1.4*  HEPARINUNFRC 0.76* 0.37 0.22*  --  0.29*    Estimated Creatinine Clearance: 40.4 mL/min (A) (by C-G formula based on SCr of 1.15 mg/dL (H)).  Assessment: 68 yr old female with hx PE/DVT in setting of  Factor V Leiden on warfarin PTA which was reversed with [place] and Vitamin K on 07/17/18 d/t coagulapathy. No current plans for EGD per GI so CCM ordered heparin to start on 7/13. Restarted warfarin with heparin bridge. After discussion with MD, ok to increase heparin goal from 0.15-0.2 given resolution of bleeding and restarting warfarin.   Warfarin regimen PTA: 5 mg MWFSun, 6 mg TTSat (1 mg tablet).   Today's heparin level is therapeutic at 0.29 on 1000 units/hour (goal of 0.2-0.5). INR remains subtherapeutic at 1.4 (goal of 2-3) but trending up slowly.  Hgb/Hct are stable at 8.5/27.7 and plts are stable at 207. No signs of bleeding or infusion issues per nursing.   Goal of Therapy:  HL 0.2-0.5 units/mL INR 2-3 Monitor platelets by anticoagulation protocol: Yes   Plan:  - Continue heparin gtt at 1000 units/hr - Warfarin 7.5 mg x 1 dose at 1800 today - Will continue to monitor for any signs/symptoms of bleeding and will follow up with heparin level and PT/INR in the a.m.   Thank you for allowing pharmacy to be a part of this patient's care.  Berenice Bouton, PharmD PGY1  Pharmacy Resident Office phone: [phone]

## 2018-08-08 NOTE — Progress Notes (Signed)
OT Cancellation Note  Patient Details Name: [name] MRN: [id number] DOB: [birth year]   Cancelled Treatment:    Reason Eval/Treat Not Completed: Patient declined, no reason specified;Pain limiting ability to participate. Pt declined to work with therapy. Pt reports pain and fear of increased pain with movement. States she feels shaky standing, offered to bring in a second person for at least safety/comfort which she declined. Offered to work with her sitting EOB only, pt declined. Discussed benefits of mobility and risks of immobility. Discussed goal of sitting up on the side of the bed next therapy session, pt nodded. Plan to reattempt at a later time.   Tyrone Schimke, OT Acute Rehabilitation Services Pager: [phone] Office: [phone]  08/08/2018, 12:26 PM

## 2018-08-08 NOTE — Progress Notes (Addendum)
Pt has been having more frequent PVCs.  Cardiology made aware. Pt denied CP/SOB/palpitations; however, c/o malaise.  Idolina Primer, RN

## 2018-08-08 NOTE — Care Management Important Message (Signed)
Important Message  Patient Details  Name: [name] MRN: [id number] Date of Birth: [birth year]   Medicare Important Message Given:  Yes     [name] 08/08/2018, 12:41 PM

## 2018-08-08 NOTE — Progress Notes (Signed)
PROGRESS NOTE  [name] PHX:[id number] DOB: Feb 12, 1950 DOA: 14-Aug-2018 PCP: [name], NP  HPI/Recap of past 24 hours: 68 yr old withCOPD, MI, HTN, and PE,Factor V Leiden onWarfarin(also on Brillinta)presented with large volumehematemesison late hrs of August 14, 2018. Coded in [facility] ED for [place]. Coagulopathy reversed with Vit K, [place].[facility]red to [facility] [place]. Received transfusions and GI consulted for EGD ( wanted pt to be off AC for 5 days).Extubatedon 7/9and transferred out to Internal Medicine service.Back to ICU 7/11 for mucus plug and left lung collapses/p thoracentesis was re-intubated and has a bronchoscopy on 7/11/ showed the whole left mainstem bronchus was occluded by mucous plug and thick secretions which extended all the way into the lower lobe. It was suctioned out and BAL was sent which was positive for Stenotrophomonas . Repeat Bronchosopy on 7/13 prior to extubation was negative. Extubated successfully on 7/13 and downgraded to Internal Medicine.  PCCM re-engaged 7/16 for increasing oxygen demand. Clinically appears to be pulm edema.Plan fordiuresisand aim to support, hopefully avoid intubation. Evening of 7/16 experienced PEA arrest. Got epi, atropine, and dopamine. Intubated and transferred to the ICU for further evaluation and management. Repeat bronchoscopy 7/17.  And transfer back to [facility] on 08/01/2018.  08/08/18: Feels constipated. Positive flatus. Will order enema.      Assessment/Plan: Active Problems:   Respiratory failure requiring intubation (HCC)   GI bleed   UGI bleed   Cardiac arrest (HCC)   Endobronchial mass  Acute on chronic hypoxic respiratory failure post extubation likely secondary to severe left atelectasis and left lung collapse She has had prior two intubations followed by extubation and 2 bronchoscopies during this hospitalization. Continue to maintain O2 saturation greater than 90% Continue nebs  Currently on 3 L of oxygen by nasal cannula saturating 98%.  Factor V Leyden on Coumadin INR is subtherapeutic 1.2 on 08/07/2018 Continue bridging with heparin drip Pharmacy managing.  Highly appreciated.  Paroxysmal A. fib In sinus rhythm Rate controlled Subtherapeutic on Coumadin Coumadin bridge with heparin drip  Chronic constipation Continue Senokot 2 tablets twice daily Continue MiraLAX twice daily c/w Dulcolax as needed and milk of magnesia Add enema  Mid chest tenderness with palpation possibly related to recent chest compression with cardiac arrest Closely monitor on telemetry and treat symptomatically   Acute blood loss anemia from GI bleed Hemoglobin stable at 8.8 on 08/05/2018 from 8.1 from 7.6 yesterday, MCV 97 No sign of overt bleeding at this time We will continue to monitor H&H  Recent massive GI bleed on Coumadin and Brilinta Prior hematemesis Continue PPI Currently stable  Recent cardiac arrest x2 2018/08/14 [place] [facility] and 07/25/18 [facility] Closely monitor on telemetry Now DNR  Left mainstem bronchus Mucous plug/left lung collapse post thoracentesis 07/20/18 (320cc) and bronchoscopy Continue flutter valve Encourage cough and airway clearance  Coronary artery disease Denies anginal symptoms On Brilinta  Hypothyroidism-continue levothyroxine  Generalized weakness/physical debility Patient not receptive to PT Fall precautions  Chronic anxiety/depression Continue home medication  Goals of care Palliative care team following.  DNR changed to full code on 08/06/2018    Code Status: DNR  Family Communication: We will call family if okay with the patient  Disposition Plan: Home possibly in 1 to 2 days when INR is therapeutic   Consultants:  None   Procedures:  Intubated August 14, 2018 and extubated 07/18/2018  Left-sided thoracentesis on 07/20/2018  Bronchoscopy on 07/20/2018  Reintubated on 07/20/2018 and extubated on 07/22/2018  Cardiac arrest on  07/25/2018.  Repeat bronchoscopy on  07/26/2018   Antimicrobials:  None   DVT prophylaxis: Coumadin bridged with heparin.   Objective: Vitals:   08/08/18 0651 08/08/18 0922 08/08/18 0924 08/08/18 0926  BP: 113/61     Pulse: 90     Resp: 20     Temp: 98.1 F (36.7 C)     TempSrc: Oral     SpO2: 100% 98% 97% 97%  Weight: 62.4 kg     Height:        Intake/Output Summary (Last 24 hours) at 08/08/2018 1442 Last data filed at 08/08/2018 1142 Gross per 24 hour  Intake 1580.39 ml  Output 800 ml  Net 780.39 ml   Filed Weights   08/06/18 0645 08/07/18 0650 08/08/18 0651  Weight: 61.1 kg 61.3 kg 62.4 kg    Exam:  . General: 68 y.o. year-old female WD WN NAD A& interactive . Cardiovascular: RRR no rubs or gallops . Respiratory: CTA no wheezes or rales . Abdomen: Obese, hypoactive BS nontender .   Musculoskeletal: Trace LE edema . Psychiatry: Mood is appropriate .  Data Reviewed: CBC: Recent Labs  Lab 08/04/18 0919 08/05/18 0411 08/06/18 0630 08/07/18 0502 08/08/18 0423  WBC 5.4 5.2 4.9 5.0 5.2  HGB 8.1* 8.8* 8.4* 8.4* 8.5*  HCT 26.5* 27.8* 27.3* 27.7* 27.7*  MCV 97.8 96.2 99.3 99.6 99.6  PLT 262 229 228 225 086   Basic Metabolic Panel: Recent Labs  Lab 08/02/18 1125 08/03/18 0420  NA 130* 130*  K 5.0 4.8  CL 91* 90*  CO2 30 30  GLUCOSE 95 88  BUN 8 8  CREATININE 1.04* 1.15*  CALCIUM 9.0 8.8*  MG 1.6* 1.7   GFR: Estimated Creatinine Clearance: 40.4 mL/min (A) (by C-G formula based on SCr of 1.15 mg/dL (H)). Liver Function Tests: No results for input(s): AST, ALT, ALKPHOS, BILITOT, PROT, ALBUMIN in the last 168 hours. No results for input(s): LIPASE, AMYLASE in the last 168 hours. No results for input(s): AMMONIA in the last 168 hours. Coagulation Profile: Recent Labs  Lab 08/04/18 0919 08/05/18 0411 08/06/18 0630 08/07/18 0502 08/08/18 0423  INR 1.2 1.1 1.1 1.2 1.4*   Cardiac Enzymes: No results for input(s): CKTOTAL, CKMB, CKMBINDEX,  TROPONINI in the last 168 hours. BNP (last 3 results) No results for input(s): PROBNP in the last 8760 hours. HbA1C: No results for input(s): HGBA1C in the last 72 hours. CBG: No results for input(s): GLUCAP in the last 168 hours. Lipid Profile: No results for input(s): CHOL, HDL, LDLCALC, TRIG, CHOLHDL, LDLDIRECT in the last 72 hours. Thyroid Function Tests: No results for input(s): TSH, T4TOTAL, FREET4, T3FREE, THYROIDAB in the last 72 hours. Anemia Panel: No results for input(s): VITAMINB12, FOLATE, FERRITIN, TIBC, IRON, RETICCTPCT in the last 72 hours. Urine analysis:    Component Value Date/Time   COLORURINE YELLOW 05/23/2018 0934   APPEARANCEUR CLEAR 05/23/2018 0934   LABSPEC 1.014 05/23/2018 0934   PHURINE 7.0 05/23/2018 0934   GLUCOSEU NEGATIVE 05/23/2018 0934   HGBUR SMALL (A) 05/23/2018 0934   BILIRUBINUR NEGATIVE 05/23/2018 0934   BILIRUBINUR n 09/21/2016 1432   KETONESUR NEGATIVE 05/23/2018 0934   PROTEINUR 30 (A) 05/23/2018 0934   UROBILINOGEN 0.2 09/21/2016 1432   UROBILINOGEN 0.2 04/13/2009 2025   NITRITE NEGATIVE 05/23/2018 0934   LEUKOCYTESUR NEGATIVE 05/23/2018 0934   Sepsis Labs: _0 (procalcitonin:4,lacticidven:4)  )No results found for this or any previous visit (from the past 240 hour(s)).    Studies: No results found.  Scheduled Meds: . albuterol  2.5 mg Nebulization  BID  . arformoterol  15 mcg Nebulization BID  . budesonide (PULMICORT) nebulizer solution  0.5 mg Nebulization BID  . buPROPion  50 mg Oral BID  . citalopram  10 mg Oral Daily  . glycopyrrolate  0.2 mg Intravenous BID  . guaiFENesin  600 mg Oral BID  . levothyroxine  125 mcg Oral Q0600  . lidocaine  1 patch Transdermal Q24H  . magnesium hydroxide  15 mL Oral Daily  . mouth rinse  15 mL Mouth Rinse BID  . pantoprazole  40 mg Oral BID  . polyethylene glycol  17 g Oral BID  . senna  2 tablet Oral BID  . sodium chloride flush  10-40 mL Intracatheter Q12H  . ticagrelor  60  mg Oral BID  . warfarin  7.5 mg Oral ONCE-1800  . Warfarin - Pharmacist Dosing Inpatient   Does not apply q1800    Continuous Infusions: . sodium chloride 10 mL/hr at 07/31/18 1800  . heparin 1,000 Units/hr (08/08/18 0200)     LOS: 22 days     Kayleen Memos, MD [facility] Pager [phone]  If 7PM-7AM, please contact night-coverage [email or web address] Password [email or web address] 08/08/2018, 2:42 PM

## 2018-08-08 NOTE — Plan of Care (Signed)

## 2018-08-09 LAB — BASIC METABOLIC PANEL
Anion gap: 7 (ref 5–15)
BUN: 17 mg/dL (ref 8–23)
CO2: 31 mmol/L (ref 22–32)
Calcium: 8.7 mg/dL — ABNORMAL LOW (ref 8.9–10.3)
Chloride: 98 mmol/L (ref 98–111)
Creatinine, Ser: 0.88 mg/dL (ref 0.44–1.00)
GFR calc Af Amer: >60 mL/min (ref >60)
GFR calc non Af Amer: >60 mL/min (ref >60)
Glucose, Bld: 91 mg/dL (ref 70–99)
Potassium: 5.4 mmol/L — ABNORMAL HIGH (ref 3.5–5.1)
Sodium: 136 mmol/L (ref 135–145)

## 2018-08-09 LAB — CBC
HCT: 28.2 % — ABNORMAL LOW (ref 36.0–46.0)
Hemoglobin: 8.6 g/dL — ABNORMAL LOW (ref 12.0–15.0)
MCH: 31.2 pg (ref 26.0–34.0)
MCHC: 30.5 g/dL (ref 30.0–36.0)
MCV: 102.2 fL — ABNORMAL HIGH (ref 80.0–100.0)
Platelets: 220 10*3/uL (ref 150–400)
RBC: 2.76 MIL/uL — ABNORMAL LOW (ref 3.87–5.11)
RDW: 16.3 % — ABNORMAL HIGH (ref 11.5–15.5)
WBC: 5.2 10*3/uL (ref 4.0–10.5)
nRBC: 0 % (ref 0.0–0.2)

## 2018-08-09 LAB — PHOSPHORUS: Phosphorus: 4.5 mg/dL (ref 2.5–4.6)

## 2018-08-09 LAB — PROTIME-INR
INR: 1.5 — ABNORMAL HIGH (ref 0.8–1.2)
Prothrombin Time: 18 seconds — ABNORMAL HIGH (ref 11.4–15.2)

## 2018-08-09 LAB — HEPARIN LEVEL (UNFRACTIONATED)
Heparin Unfractionated: 0.98 IU/mL — ABNORMAL HIGH (ref 0.30–0.70)
Heparin Unfractionated: 0.44 IU/mL (ref 0.30–0.70)

## 2018-08-09 LAB — MAGNESIUM: Magnesium: 1.8 mg/dL (ref 1.7–2.4)

## 2018-08-09 MED ORDER — WARFARIN SODIUM 7.5 MG PO TABS
7.5000 mg | ORAL_TABLET | Freq: Once | ORAL | Status: AC
  Administered 2018-08-09: 7.5 mg via ORAL
  Filled 2018-08-09: qty 1

## 2018-08-09 MED ORDER — MAGNESIUM HYDROXIDE 400 MG/5ML PO SUSP
15.0000 mL | Freq: Every evening | ORAL | Status: DC | PRN
  Administered 2018-08-10: 18:00:00 15 mL via ORAL
  Filled 2018-08-09: qty 30

## 2018-08-09 MED ORDER — FUROSEMIDE 10 MG/ML IJ SOLN
20.0000 mg | Freq: Two times a day (BID) | INTRAMUSCULAR | Status: AC
  Administered 2018-08-09 – 2018-08-10 (×2): 20 mg via INTRAVENOUS
  Filled 2018-08-09 (×2): qty 2

## 2018-08-09 MED ORDER — CALCIUM GLUCONATE-NACL 1-0.675 GM/50ML-% IV SOLN
1.0000 g | Freq: Once | INTRAVENOUS | Status: AC
  Administered 2018-08-09: 1000 mg via INTRAVENOUS
  Filled 2018-08-09: qty 50

## 2018-08-09 MED ORDER — POLYETHYLENE GLYCOL 3350 17 G PO PACK
17.0000 g | PACK | Freq: Every day | ORAL | Status: DC
  Administered 2018-08-10 – 2018-08-12 (×3): 17 g via ORAL
  Filled 2018-08-09 (×3): qty 1

## 2018-08-09 MED ORDER — SENNA 8.6 MG PO TABS
2.0000 | ORAL_TABLET | Freq: Every day | ORAL | Status: DC
  Administered 2018-08-10 – 2018-08-11 (×2): 17.2 mg via ORAL
  Filled 2018-08-09 (×2): qty 2

## 2018-08-09 NOTE — Progress Notes (Signed)
PROGRESS NOTE  [name] YDX:[id number] DOB: May 19, 1950 DOA: 08-08-18 PCP: [name], NP  HPI/Recap of past 24 hours: 68 yr old withCOPD, MI, HTN, and PE,Factor V Leiden onWarfarin(also on Brillinta)presented with large volumehematemesison late hrs of Aug 08, 2018. Coded in [facility] ED for [place]. Coagulopathy reversed with Vit K, [place].Transferred to [facility] on PPI. Received transfusions and GI consulted for EGD ( wanted pt to be off AC for 5 days).Extubatedon 7/9and transferred out to Internal Medicine service.Back to ICU 7/11 for mucus plug and left lung collapses/p thoracentesis was re-intubated and has a bronchoscopy on 7/11/ showed the whole left mainstem bronchus was occluded by mucous plug and thick secretions which extended all the way into the lower lobe. It was suctioned out and BAL was sent which was positive for Stenotrophomonas . Repeat Bronchosopy on 7/13 prior to extubation was negative. Extubated successfully on 7/13 and downgraded to Internal Medicine.  PCCM re-engaged 7/16 for increasing oxygen demand. Clinically appears to be pulm edema.Plan fordiuresisand aim to support, hopefully avoid intubation. Evening of 7/16 experienced PEA arrest. Got epi, atropine, and dopamine. Intubated and transferred to the ICU for further evaluation and management. Repeat bronchoscopy 7/17.  And transfer back to [facility] on 08/01/2018.  08/09/18: Patient was seen and examined at her bedside.  Reports constipation since she has been admitted.  Per chart review patient had a bowel movement on the 29th.  Inconsistent history, unreliable historian, however alert and oriented x3.   Assessment/Plan: Active Problems:   Respiratory failure requiring intubation (HCC)   GI bleed   UGI bleed   Cardiac arrest (HCC)   Endobronchial mass  Acute on chronic hypoxic respiratory failure post extubation likely secondary to severe left atelectasis and left lung collapse She has had  prior two intubations followed by extubation and 2 bronchoscopies during this hospitalization. Continue to maintain O2 saturation greater than 90% Continue nebs Currently on 3 L of oxygen by nasal cannula and saturating 100% Wean off oxygen supplementation as tolerated Obtain home O2 evaluation prior to discharge  Frequent PVCs Cardiology made aware by nursing Continue to closely monitor on telemetry  Hyperkalemia Potassium 5.4 on 08/09/2018 Not on potassium supplement Obtain twelve-lead EKG Give IV 1 g calcium gluconate Give 1 dose of IV Lasix 40 mg x1 Repeat BMP in the morning  Factor V Leyden on Coumadin INR is subtherapeutic 1.5 on 08/09/2018 Pharmacy managing Coumadin bridged with heparin drip Daily INR  Paroxysmal A. fib In sinus rhythm Rate controlled Therapeutic INR goal 2-3 Coumadin bridge with heparin drip  Resolved chronic constipation Continue Senokot 2 tablets twice daily MiraLAX daily as needed  Mid chest tenderness with palpation possibly related to recent chest compression with cardiac arrest Closely monitor on telemetry and treat symptomatically   Acute blood loss anemia from GI bleed Hemoglobin stable 8.6 on 08/09/2018 No sign of overt bleeding.  Recent massive GI bleed on Coumadin and Brilinta Prior hematemesis Continue PPI Currently stable  Recent cardiac arrest x2 2018/08/08 [place] [facility] and 07/25/18 [facility] Closely monitor on telemetry Now full code Continue Lidoderm patch  Left mainstem bronchus Mucous plug/left lung collapse post thoracentesis 07/20/18 (320cc) and bronchoscopy Continue flutter valve Encourage cough and airway clearance  Coronary artery disease On Brilinta  Hypothyroidism-continue levothyroxine  Generalized weakness/physical debility Patient not receptive to PT Fall precautions  Chronic anxiety/depression Continue Celexa and Wellbutrin.  Goals of care Palliative care team following.  DNR changed to full code on 08/06/2018     Code Status: Full code  Family Communication: We will call family if okay with the patient  Disposition Plan: SNF when INR is therapeutic possibly 1 to 2 days.  Consultants:  Pharmacy managing Coumadin  Procedures:  Intubated 07/16/2018 and extubated 07/18/2018  Left-sided thoracentesis on 07/20/2018  Bronchoscopy on 07/20/2018  Reintubated on 07/20/2018 and extubated on 07/22/2018  Cardiac arrest on 07/25/2018.  Repeat bronchoscopy on 07/26/2018   Antimicrobials:  None   DVT prophylaxis: Coumadin bridged with heparin.   Objective: Vitals:   08/09/18 0541 08/09/18 0619 08/09/18 0851 08/09/18 0854  BP: (!) 135/57     Pulse: 77     Resp: (!) 21     Temp: 98.2 F (36.8 C)     TempSrc: Oral     SpO2: 100%  98% 98%  Weight:  61.8 kg    Height:        Intake/Output Summary (Last 24 hours) at 08/09/2018 1316 Last data filed at 08/09/2018 0916 Gross per 24 hour  Intake 533.7 ml  Output 750 ml  Net -216.3 ml   Filed Weights   08/07/18 0650 08/08/18 0651 08/09/18 0619  Weight: 61.3 kg 62.4 kg 61.8 kg    Exam:  . General: 68 y.o. year-old female well-developed well-nourished in no acute distress.  Alert and oriented x3. . Cardiovascular: Regular rate and rhythm no rubs or gallops.  No JVD or thyromegaly noted.   [name] Respiratory: Clear to auscultation with no wheezes or rales.  Poor inspiratory effort.   . Abdomen: Obese with hypoactive bowel sounds.  Nontender on palpation. .  Musculoskeletal: Trace lower extremity edema.  2 out of 4 pulses in all 4 extremities.   [name] Psychiatry: Mood is appropriate for condition and setting. .  Data Reviewed: CBC: Recent Labs  Lab 08/05/18 0411 08/06/18 0630 08/07/18 0502 08/08/18 0423 08/09/18 0500  WBC 5.2 4.9 5.0 5.2 5.2  HGB 8.8* 8.4* 8.4* 8.5* 8.6*  HCT 27.8* 27.3* 27.7* 27.7* 28.2*  MCV 96.2 99.3 99.6 99.6 102.2*  PLT 229 228 225 207 195   Basic Metabolic Panel: Recent Labs  Lab 08/03/18 0420 08/09/18 0500  NA  130* 136  K 4.8 5.4*  CL 90* 98  CO2 30 31  GLUCOSE 88 91  BUN 8 17  CREATININE 1.15* 0.88  CALCIUM 8.8* 8.7*  MG 1.7 1.8  PHOS  --  4.5   GFR: Estimated Creatinine Clearance: 52.8 mL/min (by C-G formula based on SCr of 0.88 mg/dL). Liver Function Tests: No results for input(s): AST, ALT, ALKPHOS, BILITOT, PROT, ALBUMIN in the last 168 hours. No results for input(s): LIPASE, AMYLASE in the last 168 hours. No results for input(s): AMMONIA in the last 168 hours. Coagulation Profile: Recent Labs  Lab 08/05/18 0411 08/06/18 0630 08/07/18 0502 08/08/18 0423 08/09/18 0500  INR 1.1 1.1 1.2 1.4* 1.5*   Cardiac Enzymes: No results for input(s): CKTOTAL, CKMB, CKMBINDEX, TROPONINI in the last 168 hours. BNP (last 3 results) No results for input(s): PROBNP in the last 8760 hours. HbA1C: No results for input(s): HGBA1C in the last 72 hours. CBG: No results for input(s): GLUCAP in the last 168 hours. Lipid Profile: No results for input(s): CHOL, HDL, LDLCALC, TRIG, CHOLHDL, LDLDIRECT in the last 72 hours. Thyroid Function Tests: No results for input(s): TSH, T4TOTAL, FREET4, T3FREE, THYROIDAB in the last 72 hours. Anemia Panel: No results for input(s): VITAMINB12, FOLATE, FERRITIN, TIBC, IRON, RETICCTPCT in the last 72 hours. Urine analysis:    Component Value Date/Time   COLORURINE YELLOW 05/23/2018  [place] 05/23/2018 0934   LABSPEC 1.014 05/23/2018 [place] 7.0 05/23/2018 0934   GLUCOSEU NEGATIVE 05/23/2018 0934   HGBUR SMALL (A) 05/23/2018 0934   BILIRUBINUR NEGATIVE 05/23/2018 0934   BILIRUBINUR n 09/21/2016 1432   KETONESUR NEGATIVE 05/23/2018 0934   PROTEINUR 30 (A) 05/23/2018 0934   UROBILINOGEN 0.2 09/21/2016 1432   UROBILINOGEN 0.2 04/13/2009 2025   NITRITE NEGATIVE 05/23/2018 0934   LEUKOCYTESUR NEGATIVE 05/23/2018 0934   Sepsis Labs: _0 (procalcitonin:4,lacticidven:4)  )No results found for this or any previous visit (from the  past 240 hour(s)).    Studies: No results found.  Scheduled Meds: . albuterol  2.5 mg Nebulization BID  . arformoterol  15 mcg Nebulization BID  . budesonide (PULMICORT) nebulizer solution  0.5 mg Nebulization BID  . buPROPion  50 mg Oral BID  . citalopram  10 mg Oral Daily  . glycopyrrolate  0.2 mg Intravenous BID  . guaiFENesin  600 mg Oral BID  . levothyroxine  125 mcg Oral Q0600  . lidocaine  1 patch Transdermal Q24H  . magnesium hydroxide  15 mL Oral Daily  . mouth rinse  15 mL Mouth Rinse BID  . pantoprazole  40 mg Oral BID  . polyethylene glycol  17 g Oral BID  . senna  2 tablet Oral BID  . sodium chloride flush  10-40 mL Intracatheter Q12H  . ticagrelor  60 mg Oral BID  . warfarin  7.5 mg Oral ONCE-1800  . Warfarin - Pharmacist Dosing Inpatient   Does not apply q1800    Continuous Infusions: . sodium chloride 10 mL/hr at 07/31/18 1800  . heparin 1,000 Units/hr (08/08/18 1957)     LOS: 23 days     Kayleen Memos, MD [facility] Pager [phone]  If 7PM-7AM, please contact night-coverage [email or web address] Password [email or web address] 08/09/2018, 1:16 PM

## 2018-08-09 NOTE — Progress Notes (Signed)
ANTICOAGULATION CONSULT NOTE - Follow Up Consult  Pharmacy Consult for Heparin   Indication: atrial fibrillation  Patient Measurements: Height: 5\' 4"  (162.6 cm) Weight: 136 lb 3.9 oz (61.8 kg) IBW/kg (Calculated) : 54.7  Vital Signs: Temp: 98.2 F (36.8 C) (07/31 0541) Temp Source: Oral (07/31 0541) BP: 135/57 (07/31 0541) Pulse Rate: 77 (07/31 0541)  Labs: Recent Labs    08/07/18 0502 08/08/18 0423 08/09/18 0500 08/09/18 0747  HGB 8.4* 8.5* 8.6*  --   HCT 27.7* 27.7* 28.2*  --   PLT 225 207 220  --   LABPROT 15.3* 16.7* 18.0*  --   INR 1.2 1.4* 1.5*  --   HEPARINUNFRC  --  0.29* 0.98* 0.44  CREATININE  --   --  0.88  --     Estimated Creatinine Clearance: 52.8 mL/min (by C-G formula based on SCr of 0.88 mg/dL).  Assessment: 70 yoF with hx PE/DVT in setting of Factor V Leiden on warfarin PTA which was reversed with KCentra and Vitamin K on 07/17/18 d/t coagulapathy. No current plans for EGD per GI so CCM ordered heparin to start on 7/13.   Restarted warfarin with heparin bridge on 7/24. Warfarin was held on 7/25. After discussion with MD, ok to increase heparin goal from 0.15-0.2 given resolution of bleeding and restarting warfarin.   Warfarin regimen PTA: 5 mg MWFSun, 6 mg TTSat (1 mg tablet).   Today's heparin level is therapeutic at 0.44 on 1000 units/hour (goal of 0.2-0.5). This morning there was a falsely elevated heparin level of 0.98 which was drawn from the PICC line so another lab draw was ordered.   INR remains subtherapeutic at 1.5 (goal of 2-3) but trending up slowly.  Hgb/Hct are low but stable at 8.6/28.2 and plts are stable at 220. No signs of bleeding or infusion issues per nursing.   Goal of Therapy:  Heparin level goal 0.2-0.5 units/mL INR goal 2-3 Monitor platelets by anticoagulation protocol: Yes   Plan:  - Continue heparin gtt at 1000 units/hr - Warfarin 7.5 mg x 1 dose at 1800 today - Will continue to monitor CBC and for any signs/symptoms  of bleeding - Daily heparin level and PT/INR - F/u plan to d/c heparin when INR therapeutic >24 hrs  Thank you for allowing pharmacy to be a part of this patient's care.  Kennon Holter, PharmD PGY1 Ambulatory Care Pharmacy Resident [employer] Phone: [phone]

## 2018-08-09 NOTE — TOC Progression Note (Signed)
Transition of Care [facility]) - Progression Note    Patient Details  Name: [name] MRN: [id number] Date of Birth: 06/02/1950  Transition of Care [facility]) CM/SW [place], [place] Phone Number: 08/09/2018, 2:36 PM  Clinical Narrative:     [place] w/[facility] in [place]/SNF called to inquire if the patient was ready for discharge today. CSW advised per MD notes possible 1-2 days, when INR is theapeutic. SNF requested Covid 24-48 hrs of d/c. CSW updated RN. CSW will keep SNF posted.   Thurmond Butts, MSW, [facility] Clinical Social Worker [phone]    Expected Discharge Plan: Skilled Nursing Facility Barriers to Discharge: Continued Medical Work up  Expected Discharge Plan and Services Expected Discharge Plan: [place] In-house Referral: NA Discharge Planning Services: CM Consult Post Acute Care Choice: [place] Living arrangements for the past 2 months: Single Family Home                           HH Arranged: NA           Social Determinants of Health (SDOH) Interventions    Readmission Risk Interventions Readmission Risk Prevention Plan 08/06/2018 07/19/2018 05/29/2018  Transportation Screening Complete - Complete  Medication Review (RN Care Manager) Complete Referral to Pharmacy Complete  PCP or Specialist appointment within 3-5 days of discharge Complete Not Complete -  PCP/Specialist Appt Not Complete comments - Continued medical workup -  HRI or Home Care Consult Complete Complete Complete  SW Recovery Care/Counseling Consult Complete Complete Complete  Palliative Care Screening Not Applicable Not Applicable Not Applicable  Skilled Nursing Facility Complete Complete Patient Refused  Some recent data might be hidden

## 2018-08-09 NOTE — Progress Notes (Addendum)
Patient bathed, sat on side of bed and was able to stand for morning weight. Spoke to the patient about the importance of getting up and working with OT/PT this morning. Patient agreed about getting up this morning as this will help her go home. Will pass on to day shift nurse.

## 2018-08-09 NOTE — Progress Notes (Signed)
Physical Therapy Treatment Patient Details Name: [name] MRN: [id number] DOB: [birth year] Today's Date: 08/09/2018    History of Present Illness 68 year old with Factor V Leiden on warfarin presented with hematemesis. Coded in [facility] ED for 12 minutes. Extubated 7/9, back to ICU 7/11 for mucus plug and L lung collapse s/p thoracentesis and reintubated 7/11-7/13. 7/16 PEA arrest. Intubated and transferred to ICU, repeat broncoscopy 7/17. Extubated 7/21. PMH: COPD, MI, HTN, artherosclerotic cardiovascular disease, chronic respiratory failure, tobacco use, and PE.     PT Comments    Pt  Initially resistant to therapy but with some education and encouragement pt willing to participate stating she did use BSC for the first time today. Pt educated for sequence of mobility to ease pain in chest with transfers and for benefit of mobility. Pt able to walk short distance and perform HEp at EOB and agreeable to continue to progress current mobility as long as she doesn't have to sit in chair. Pt placed in chair position in bed end of session.  HR 102 with activity SpO2 96-99% on 3L   Follow Up Recommendations  SNF;Supervision for mobility/OOB     Equipment Recommendations  Rolling walker with 5" wheels    Recommendations for Other Services       Precautions / Restrictions Precautions Precautions: Fall    Mobility  Bed Mobility Overal bed mobility: Needs Assistance Bed Mobility: Rolling;Sidelying to Sit;Sit to Supine Rolling: Min guard Sidelying to sit: Min assist   Sit to supine: Min guard   General bed mobility comments: cues for sequence with reliance on rail with cues for use of bil LE to push to roll and use momentum to rise with min assist to elevate trunk. pt able to return to supine with increased time  Transfers Overall transfer level: Needs assistance   Transfers: Sit to/from Stand Sit to Stand: Min guard         General transfer comment: cues for hand  placement to rise from EOB  Ambulation/Gait Ambulation/Gait assistance: Min guard Gait Distance (Feet): 22 Feet Assistive device: Rolling walker (2 wheeled) Gait Pattern/deviations: Step-through pattern;Decreased stride length;Trunk flexed   Gait velocity interpretation: >2.62 ft/sec, indicative of community ambulatory General Gait Details: pt with encouragement to walk with cues for posture and position in RW. Pt declined further distance   Stairs             Wheelchair Mobility    Modified Rankin (Stroke Patients Only)       Balance Overall balance assessment: Needs assistance   Sitting balance-Leahy Scale: Good     Standing balance support: Bilateral upper extremity supported;During functional activity Standing balance-Leahy Scale: Poor                              Cognition Arousal/Alertness: Awake/alert Behavior During Therapy: Flat affect Overall Cognitive Status: Impaired/Different from baseline Area of Impairment: Orientation                 Orientation Level: Situation             General Comments: pt not fully oriented to situation but aware she coded. Pt somewhat self limiting      Exercises General Exercises - Lower Extremity Long Arc Quad: AROM;15 reps;Both;Seated Hip Flexion/Marching: AROM;15 reps;Both;Seated    General Comments        Pertinent Vitals/Pain Pain Score: 8  Pain Location: chest and back Pain Descriptors / Indicators:  Aching;Discomfort;Sore;Grimacing Pain Intervention(s): Limited activity within patient's tolerance;Repositioned;Monitored during session    Home Living                      Prior Function            PT Goals (current goals can now be found in the care plan section) Progress towards PT goals: Progressing toward goals    Frequency           PT Plan Current plan remains appropriate    Co-evaluation              AM-PAC PT "6 Clicks" Mobility   Outcome  Measure  Help needed turning from your back to your side while in a flat bed without using bedrails?: A Little Help needed moving from lying on your back to sitting on the side of a flat bed without using bedrails?: A Little Help needed moving to and from a bed to a chair (including a wheelchair)?: A Little Help needed standing up from a chair using your arms (e.g., wheelchair or bedside chair)?: A Little Help needed to walk in hospital room?: A Little Help needed climbing 3-5 steps with a railing? : A Lot 6 Click Score: 17    End of Session Equipment Utilized During Treatment: Oxygen Activity Tolerance: Patient tolerated treatment well Patient left: in bed;with call bell/phone within reach;with bed alarm set(pt refused OOB to chair stating discomfort in chair) Nurse Communication: Mobility status PT Visit Diagnosis: Muscle weakness (generalized) (M62.81);Difficulty in walking, not elsewhere classified (R26.2);Pain     Time: 6578-4696 PT Time Calculation (min) (ACUTE ONLY): 33 min  Charges:  $Gait Training: 8-22 mins $Therapeutic Activity: 8-22 mins                     [name] Pam Drown, PT Acute Rehabilitation Services Pager: [phone] Office: [place] 08/09/2018, 1:10 PM

## 2018-08-10 LAB — CBC
HCT: 29.9 % — ABNORMAL LOW (ref 36.0–46.0)
Hemoglobin: 9 g/dL — ABNORMAL LOW (ref 12.0–15.0)
MCH: 30.6 pg (ref 26.0–34.0)
MCHC: 30.1 g/dL (ref 30.0–36.0)
MCV: 101.7 fL — ABNORMAL HIGH (ref 80.0–100.0)
Platelets: 228 10*3/uL (ref 150–400)
RBC: 2.94 MIL/uL — ABNORMAL LOW (ref 3.87–5.11)
RDW: 16.7 % — ABNORMAL HIGH (ref 11.5–15.5)
WBC: 4.6 10*3/uL (ref 4.0–10.5)
nRBC: 0 % (ref 0.0–0.2)

## 2018-08-10 LAB — PROTIME-INR
INR: 1.8 — ABNORMAL HIGH (ref 0.8–1.2)
Prothrombin Time: 21 seconds — ABNORMAL HIGH (ref 11.4–15.2)

## 2018-08-10 LAB — HEPARIN LEVEL (UNFRACTIONATED): Heparin Unfractionated: 0.52 IU/mL (ref 0.30–0.70)

## 2018-08-10 MED ORDER — WARFARIN SODIUM 7.5 MG PO TABS
7.5000 mg | ORAL_TABLET | Freq: Once | ORAL | Status: AC
  Administered 2018-08-10: 7.5 mg via ORAL
  Filled 2018-08-10: qty 1

## 2018-08-10 NOTE — Progress Notes (Signed)
   08/10/18 1900  Precautions / Armbands  Precautions Aspiration;Fall risk  Patient armbands applied: Patient Identification (White)  Risk for Aspiration  Aspiration Assessment - High Risk History of Aspiration Pneumonia  Aspiration Assessment - At Risk Poor Dentition  Aspiration Risk Interventions High risk interventions implemented  Adult Fall Risk Assessment  Risk Factor Category (scoring not indicated) Not Applicable  Age 68  Fall History: Fall within 6 months prior to admission 5  Elimination; Bowel and/or Urine Incontinence 2  Elimination; Bowel and/or Urine Urgency/Frequency 0  Medications: includes PCA/Opiates, Anti-convulsants, Anti-hypertensives, Diuretics, Hypnotics, Laxatives, Sedatives, and Psychotropics 5  Patient Care Equipment 2  Mobility-Assistance 2  Mobility-Gait 2  Mobility-Sensory Deficit 2  Altered awareness of immediate physical environment 0  Impulsiveness 0  Lack of understanding of one's physical/cognitive limitations 0  Total Score 21  Patient Fall Risk Level High fall risk  Adult Fall Risk Interventions  Required Bundle Interventions *See Row Information* High fall risk - low, moderate, and high requirements implemented  Additional Interventions Use of appropriate toileting equipment (bedpan, BSC, etc.)  Screening for Fall Injury Risk (To be completed on HIGH fall risk patients) - Assessing Need for Low Bed  Risk For Fall Injury- Low Bed Criteria None identified - Continue screening  Screening for Fall Injury Risk (To be completed on HIGH fall risk patients who do not meet crieteria for Low Bed) - Assessing Need for Floor Mats Only  Risk For Fall Injury- Criteria for Floor Mats Bleeding risk-anticoagulation (not prophylaxis)  Will Implement Floor Mats Yes  Safe Patient Handling Required Documentation-Repositioning Needs  Assist with movement in bed Yes  Fragile skin with pressure ulcer No  Unresponsive No  Maxislide indicated Yes  Safe Patient Handling  Assessment  Ambulates independently No  Pt pulls to standing position & wt <120kg (265lbs) No  Safe Patient Handling Equipment One assist recommended  Safe Patient Handling - Sara/Sabina Lift Assessment  The patient is able to grip with one hand (2nd staff member available);less than 190kg (420lbs) in weight;able to follow simple commands  Safe Patient Handling - Maximove / Viking Total Lift Assessment  Can tolerate a semi reclined position Yes  Weighs less than 227kg (500lbs) Yes  Safe Patient Handling Equipment Maximove / Viking Total Lift recommended  Safety Interventions  Less Restrictive Interventions Active listening;Bed alarm;Frequent verbal contacts  Diversional Activities Patient education TV channels  Violence Assessment Tool  History of Violence 0  Confused 0  Irritable 0  Boisterous 0  Verbal Threats 0  Physical Threats 0  Attacking Objects 0  Agitate/Impulsive 0  Paranoid/Suspicious 0  Intoxication/Withdrawal 0  Socially Inappropriate/Disruptive Behavior 0  Body Language 0  VAT Score 0  VAT Category Low Risk  VAT Interventions Low - Universal Precautions (Score <1) implemented  Safe Environment  Patient oriented to unit and equipment Yes

## 2018-08-10 NOTE — Progress Notes (Signed)
ANTICOAGULATION CONSULT NOTE - Follow Up Consult  Pharmacy Consult for Heparin   Indication: hx PE/DVT in setting of Factor V Leiden  Patient Measurements: Height: 5\' 4"  (162.6 cm) Weight: 133 lb 6.1 oz (60.5 kg) IBW/kg (Calculated) : 54.7  Vital Signs: Temp: 98.4 F (36.9 C) (08/01 0450) Temp Source: Oral (08/01 0450) BP: 127/65 (08/01 0450) Pulse Rate: 83 (08/01 0450)  Labs: Recent Labs    08/08/18 0423 08/09/18 0500 08/09/18 0747 08/10/18 0513  HGB 8.5* 8.6*  --  9.0*  HCT 27.7* 28.2*  --  29.9*  PLT 207 220  --  228  LABPROT 16.7* 18.0*  --  21.0*  INR 1.4* 1.5*  --  1.8*  HEPARINUNFRC 0.29* 0.98* 0.44 0.52  CREATININE  --  0.88  --   --     Estimated Creatinine Clearance: 52.8 mL/min (by C-G formula based on SCr of 0.88 mg/dL).  Assessment: 75 yoF with hx PE/DVT in setting of Factor V Leiden on warfarin PTA which was reversed with KCentra and Vitamin K on 07/17/18 d/t coagulapathy. No current plans for EGD per GI so CCM ordered heparin to start on 7/13.   Restarted warfarin with heparin bridge on 7/24. Warfarin was held on 7/25. After discussion with MD, ok to increase heparin goal from 0.15-0.2 given resolution of bleeding and restarting warfarin.   Warfarin regimen PTA: 5 mg MWFSun, 6 mg TTSat (1 mg tablet).   Today's heparin level is slightly above goal at 0.52 on 1000 units/hour (goal of 0.2-0.5).  INR remains subtherapeutic at 1.8 (goal of 2-3) but trending up.  Hgb/Hct are low but stable at 9.0/29.9 and plts are stable at 228. No signs of bleeding or infusion issues per nursing.   Goal of Therapy:  Heparin level goal 0.2-0.5 units/mL INR goal 2-3 Monitor platelets by anticoagulation protocol: Yes   Plan:  - Decrease heparin drip to 950 units/hr - Warfarin 7.5 mg x 1 dose at 1800 today - Will continue to monitor CBC and for any signs/symptoms of bleeding - Daily heparin level and PT/INR - F/u plan to d/c heparin when INR therapeutic >24 hrs  Thank  you for allowing pharmacy to be a part of this patient's care.  Kennon Holter, PharmD PGY1 Ambulatory Care Pharmacy Resident [employer] Phone: [phone]

## 2018-08-10 NOTE — Plan of Care (Signed)

## 2018-08-10 NOTE — Discharge Summary (Signed)
PROGRESS NOTE  Lattie Riege ESP:[id number] DOB: 1950-04-08 DOA: 2018-07-20 PCP: [name], NP  HPI/Recap of past 24 hours: 68 yr old withCOPD, MI, HTN, and PE,Factor V Leiden onWarfarin(also on Brillinta)presented with large volumehematemesison late hrs of 07-20-2018. Coded in [facility] ED for [place]. Coagulopathy reversed with Vit K, [place] to [place] [facility]. Received transfusions and GI consulted for EGD ( wanted pt to be off AC for 5 days).Extubatedon 7/9and transferred out to Internal Medicine service.Back to ICU 7/11 for mucus plug and left lung collapses/p thoracentesis was re-intubated and has a bronchoscopy on 7/11/ showed the whole left mainstem bronchus was occluded by mucous plug and thick secretions which extended all the way into the lower lobe. It was suctioned out and BAL was sent which was positive for Stenotrophomonas . Repeat Bronchosopy on 7/13 prior to extubation was negative. Extubated successfully on 7/13 and downgraded to Internal Medicine.  PCCM re-engaged 07/25/18 for increasing oxygen demand, likely pulmonary edema. Evening of 7/16 experienced PEA arrest. Got epi, atropine, and dopamine. Intubated and transferred to the ICU. Repeat bronchoscopy 7/17. And transfer back to [facility] on 08/01/2018.  O2 sat stable on 3L, 100%. Course complicated by slow to improve INR on coumadin requiring bridging with heparin drip, managed by pharmacy.  08/10/18: Patient was seen and examined at her bedside.  She has no new complaints.  No acute events overnight.  COVID-19 test for SNF pending.  Clinically stable to discharge to SNF.  Awaiting results of COVID-19 Send out.   Assessment/Plan: Active Problems:   Respiratory failure requiring intubation (HCC)   GI bleed   UGI bleed   Cardiac arrest (HCC)   Endobronchial mass  Acute on chronic hypoxic respiratory failure post extubation likely secondary to severe left atelectasis and left lung collapse  She has had prior two intubations followed by extubation and 2 bronchoscopies during this hospitalization. Continue to maintain O2 saturation greater than 90% Currently on 3 L and saturating 100% Continue nebs scheduled and as needed Wean off oxygen as tolerated Home O2 evaluation ordered for DC planning  Frequent PVCs Denies anginal symptoms Cardiology made aware by nursing Continue to closely monitor on telemetry  Hyperkalemia Potassium 5.4 on 08/09/2018 Not on potassium supplement Twelve-lead EKG showed no peak T waves and no specific ST-T changes Treated with 1 g IV calcium gluconate once and IV Lasix 40 mg once on 08/09/2018 Repeat BMP  Factor V Leyden on Coumadin INR is improved 1.8 on 08/10/2018 Pharmacy managing Coumadin bridged with heparin drip Goal INR between 2 and 3  Paroxysmal A. fib In sinus rhythm Rate controlled Therapeutic INR goal 2-3  Resolved chronic constipation Continue Senokot 2 tablets twice daily MiraLAX daily as needed  Mid chest tenderness with palpation possibly related to recent chest compression with cardiac arrest Closely monitor on telemetry and treat symptomatically   Acute blood loss anemia from GI bleed Hemoglobin stable 9.0 on 08/10/2018 No sign of overt bleeding.  Recent massive GI bleed on Coumadin and Brilinta Prior hematemesis Continue PPI Currently stable  Recent cardiac arrest x2 07/20/18 [facility] and 07/25/18 [facility] Closely monitor on telemetry Now full code Continue Lidoderm patch  Left mainstem bronchus Mucous plug/left lung collapse post thoracentesis 07/20/18 (320cc) and bronchoscopy Continue flutter valve Encourage cough and airway clearance  Coronary artery disease On Brilinta  Hypothyroidism-continue levothyroxine  Generalized weakness/physical debility PT recommended SNF, DME rolling walker with 5 inches wheels Continue physical therapy Fall precautions  Chronic anxiety/depression Continue Celexa and  Wellbutrin.  Goals of care Palliative care team following.  DNR changed to full code on 08/06/2018    Code Status: Full code  Family Communication: We will call family if okay with the patient  Disposition Plan: Medically stable for discharge, pending COVID-19 Send out for SNF.  Consultants:  Pharmacy managing Coumadin  Procedures:  Intubated 07/16/2018 and extubated 07/18/2018  Left-sided thoracentesis on 07/20/2018  Bronchoscopy on 07/20/2018  Reintubated on 07/20/2018 and extubated on 07/22/2018  Cardiac arrest on 07/25/2018.  Repeat bronchoscopy on 07/26/2018   Antimicrobials:  None   DVT prophylaxis: Coumadin bridged with heparin.   Objective: Vitals:   08/10/18 0556 08/10/18 0743 08/10/18 0745 08/10/18 0746  BP:      Pulse:      Resp:      Temp:      TempSrc:      SpO2:  100% 100% 100%  Weight: 60.5 kg     Height:        Intake/Output Summary (Last 24 hours) at 08/10/2018 0928 Last data filed at 08/10/2018 0600 Gross per 24 hour  Intake 556.24 ml  Output 1825 ml  Net -1268.76 ml   Filed Weights   08/08/18 0651 08/09/18 0619 08/10/18 0556  Weight: 62.4 kg 61.8 kg 60.5 kg    Exam:  . General: 68 y.o. year-old female well-developed well-nourished in no acute distress.  Alert and oriented x3.   . Cardiovascular: Regular rate and rhythm no rubs or gallops no JVD or thyromegaly.   [name] Respiratory: Clear to auscultation.  No wheezes or rales.  Poor inspiratory effort.   . Abdomen: Obese nondistended, nontender.  Positive bowel sounds present.. .  Musculoskeletal: Trace lower extremity edema.  2 out of 4 pulses in all 4 extremities.   [name] Psychiatry: Mood is appropriate for condition and setting.  Data Reviewed: CBC: Recent Labs  Lab 08/06/18 0630 08/07/18 0502 08/08/18 0423 08/09/18 0500 08/10/18 0513  WBC 4.9 5.0 5.2 5.2 4.6  HGB 8.4* 8.4* 8.5* 8.6* 9.0*  HCT 27.3* 27.7* 27.7* 28.2* 29.9*  MCV 99.3 99.6 99.6 102.2* 101.7*  PLT 228 225 207 220 213    Basic Metabolic Panel: Recent Labs  Lab 08/09/18 0500  NA 136  K 5.4*  CL 98  CO2 31  GLUCOSE 91  BUN 17  CREATININE 0.88  CALCIUM 8.7*  MG 1.8  PHOS 4.5   GFR: Estimated Creatinine Clearance: 52.8 mL/min (by C-G formula based on SCr of 0.88 mg/dL). Liver Function Tests: No results for input(s): AST, ALT, ALKPHOS, BILITOT, PROT, ALBUMIN in the last 168 hours. No results for input(s): LIPASE, AMYLASE in the last 168 hours. No results for input(s): AMMONIA in the last 168 hours. Coagulation Profile: Recent Labs  Lab 08/06/18 0630 08/07/18 0502 08/08/18 0423 08/09/18 0500 08/10/18 0513  INR 1.1 1.2 1.4* 1.5* 1.8*   Cardiac Enzymes: No results for input(s): CKTOTAL, CKMB, CKMBINDEX, TROPONINI in the last 168 hours. BNP (last 3 results) No results for input(s): PROBNP in the last 8760 hours. HbA1C: No results for input(s): HGBA1C in the last 72 hours. CBG: No results for input(s): GLUCAP in the last 168 hours. Lipid Profile: No results for input(s): CHOL, HDL, LDLCALC, TRIG, CHOLHDL, LDLDIRECT in the last 72 hours. Thyroid Function Tests: No results for input(s): TSH, T4TOTAL, FREET4, T3FREE, THYROIDAB in the last 72 hours. Anemia Panel: No results for input(s): VITAMINB12, FOLATE, FERRITIN, TIBC, IRON, RETICCTPCT in the last 72 hours. Urine analysis:    Component Value Date/Time  COLORURINE YELLOW 05/23/2018 [place] 05/23/2018 0934   LABSPEC 1.014 05/23/2018 [place] 7.0 05/23/2018 0934   GLUCOSEU NEGATIVE 05/23/2018 0934   HGBUR SMALL (A) 05/23/2018 0934   BILIRUBINUR NEGATIVE 05/23/2018 0934   BILIRUBINUR n 09/21/2016 1432   KETONESUR NEGATIVE 05/23/2018 0934   PROTEINUR 30 (A) 05/23/2018 0934   UROBILINOGEN 0.2 09/21/2016 1432   UROBILINOGEN 0.2 04/13/2009 2025   NITRITE NEGATIVE 05/23/2018 0934   LEUKOCYTESUR NEGATIVE 05/23/2018 0934   Sepsis Labs: _0 (procalcitonin:4,lacticidven:4)  )No results found for this or any  previous visit (from the past 240 hour(s)).    Studies: No results found.  Scheduled Meds: . albuterol  2.5 mg Nebulization BID  . arformoterol  15 mcg Nebulization BID  . budesonide (PULMICORT) nebulizer solution  0.5 mg Nebulization BID  . buPROPion  50 mg Oral BID  . citalopram  10 mg Oral Daily  . glycopyrrolate  0.2 mg Intravenous BID  . guaiFENesin  600 mg Oral BID  . levothyroxine  125 mcg Oral Q0600  . lidocaine  1 patch Transdermal Q24H  . mouth rinse  15 mL Mouth Rinse BID  . pantoprazole  40 mg Oral BID  . polyethylene glycol  17 g Oral Daily  . senna  2 tablet Oral QHS  . sodium chloride flush  10-40 mL Intracatheter Q12H  . ticagrelor  60 mg Oral BID  . warfarin  7.5 mg Oral ONCE-1800  . Warfarin - Pharmacist Dosing Inpatient   Does not apply q1800    Continuous Infusions: . sodium chloride 10 mL/hr at 07/31/18 1800  . heparin 950 Units/hr (08/10/18 0732)     LOS: 24 days     Kayleen Memos, MD [facility] Pager [phone]  If 7PM-7AM, please contact night-coverage [email or web address] Password [facility] 08/10/2018, 9:28 AM

## 2018-08-11 LAB — PROTIME-INR
INR: 2.4 — ABNORMAL HIGH (ref 0.8–1.2)
Prothrombin Time: 25.7 seconds — ABNORMAL HIGH (ref 11.4–15.2)

## 2018-08-11 LAB — CBC
HCT: 28.2 % — ABNORMAL LOW (ref 36.0–46.0)
Hemoglobin: 8.5 g/dL — ABNORMAL LOW (ref 12.0–15.0)
MCH: 30.8 pg (ref 26.0–34.0)
MCHC: 30.1 g/dL (ref 30.0–36.0)
MCV: 102.2 fL — ABNORMAL HIGH (ref 80.0–100.0)
Platelets: 211 10*3/uL (ref 150–400)
RBC: 2.76 MIL/uL — ABNORMAL LOW (ref 3.87–5.11)
RDW: 16.7 % — ABNORMAL HIGH (ref 11.5–15.5)
WBC: 3.7 10*3/uL — ABNORMAL LOW (ref 4.0–10.5)
nRBC: 0 % (ref 0.0–0.2)

## 2018-08-11 LAB — HEPARIN LEVEL (UNFRACTIONATED): Heparin Unfractionated: 0.47 IU/mL (ref 0.30–0.70)

## 2018-08-11 LAB — NOVEL CORONAVIRUS, NAA (HOSP ORDER, SEND-OUT TO REF LAB; TAT 18-24 HRS): SARS-CoV-2, NAA: NOT DETECTED

## 2018-08-11 MED ORDER — WARFARIN SODIUM 7.5 MG PO TABS
7.5000 mg | ORAL_TABLET | Freq: Once | ORAL | Status: AC
  Administered 2018-08-11: 7.5 mg via ORAL
  Filled 2018-08-11: qty 1

## 2018-08-11 NOTE — Progress Notes (Signed)
PROGRESS NOTE  [name] LOV:[id number] DOB: 1950-06-08 DOA: 08-07-2018 PCP: [name], NP  HPI/Recap of past 24 hours: 68 yr old withCOPD, MI, HTN, and PE,Factor V Leiden onWarfarin(also on Brillinta)presented with large volumehematemesison late hrs of August 07, 2018. Coded in [facility] ED for [place]. Coagulopathy reversed with Vit K, [place].Transferred to [place] PPI. Received transfusions and GI consulted for EGD ( wanted pt to be off AC for 5 days).Extubatedon 7/9and transferred out to Internal Medicine service.Back to ICU 7/11 for mucus plug and left lung collapses/p thoracentesis was re-intubated and has a bronchoscopy on 7/11/ showed the whole left mainstem bronchus was occluded by mucous plug and thick secretions which extended all the way into the lower lobe. It was suctioned out and BAL was sent which was positive for Stenotrophomonas . Repeat Bronchosopy on 7/13 prior to extubation was negative. Extubated successfully on 7/13 and downgraded to Internal Medicine.  PCCM re-engaged 07/25/18 for increasing oxygen demand, likely pulmonary edema. Evening of 7/16 experienced PEA arrest. Got epi, atropine, and dopamine. Intubated and transferred to the ICU. Repeat bronchoscopy 7/17. And transfer back to [facility] on 08/01/2018.  O2 sat stable on 3L, 100%. Course complicated by slow to improve INR on coumadin requiring bridging with heparin drip, managed by pharmacy.  08/10/18: Patient was seen and examined at her bedside.  She has no new complaints.  No acute events overnight.  COVID-19 test for SNF pending.  Clinically stable to discharge to SNF.  Awaiting results of COVID-19 Send out.   Assessment/Plan: Active Problems:   Respiratory failure requiring intubation (HCC)   GI bleed   UGI bleed   Cardiac arrest (HCC)   Endobronchial mass  Acute on chronic hypoxic respiratory failure post extubation likely secondary to severe left atelectasis and left lung collapse  She has had prior two intubations followed by extubation and 2 bronchoscopies during this hospitalization. Continue to maintain O2 saturation greater than 90% Currently on 3 L and saturating 100% Continue nebs scheduled and as needed Wean off oxygen as tolerated Home O2 evaluation ordered for DC planning  Frequent PVCs Denies anginal symptoms Cardiology made aware by nursing Continue to closely monitor on telemetry  Hyperkalemia Potassium 5.4 on 08/09/2018 Not on potassium supplement Twelve-lead EKG showed no peak T waves and no specific ST-T changes Treated with 1 g IV calcium gluconate once and IV Lasix 40 mg once on 08/09/2018 Repeat BMP in am  Factor V Leyden on Coumadin INR is improved 2.4 on 08/11/2018 Pharmacy managing Coumadin, Goal INR between 2 and 3  Paroxysmal A. fib In sinus rhythm Rate controlled Therapeutic INR goal 2-3  Resolved chronic constipation Continue Senokot 2 tablets twice daily MiraLAX daily as needed  Mid chest tenderness with palpation possibly related to recent chest compression with cardiac arrest Closely monitor on telemetry and treat symptomatically   Acute blood loss anemia from GI bleed Hemoglobin stable 8.5 on 08/11/2018 No sign of overt bleeding.  Recent massive GI bleed on Coumadin and Brilinta Prior hematemesis Continue PPI Currently stable  Recent cardiac arrest x2 2018-08-07 [facility] and 07/25/18 [facility] Closely monitor on telemetry Now full code Continue Lidoderm patch  Left mainstem bronchus Mucous plug/left lung collapse post thoracentesis 07/20/18 (320cc) and bronchoscopy Continue flutter valve Encourage cough and airway clearance  Coronary artery disease On Brilinta  Hypothyroidism-continue levothyroxine  Generalized weakness/physical debility PT recommended SNF, DME rolling walker with 5 inches wheels Continue physical therapy Fall precautions  Chronic anxiety/depression Continue Celexa and Wellbutrin.  Goals  of care  Palliative care team following.  DNR changed to full code on 08/06/2018    Code Status: Full code Family Communication: none Disposition Plan: Medically stable for discharge, pending COVID-19 Send out for SNF.  Consultants:  Pharmacy managing Coumadin  Procedures:  Intubated 07/16/2018 and extubated 07/18/2018  Left-sided thoracentesis on 07/20/2018  Bronchoscopy on 07/20/2018  Reintubated on 07/20/2018 and extubated on 07/22/2018  Cardiac arrest on 07/25/2018.  Repeat bronchoscopy on 07/26/2018   Antimicrobials:  None   DVT prophylaxis: Coumadin    Objective: Vitals:   08/11/18 0734 08/11/18 1200 08/11/18 1248 08/11/18 1500  BP:    (!) 114/56  Pulse:  73 89 80  Resp:    19  Temp:    98.6 F (37 C)  TempSrc:    Oral  SpO2: 100% 100% 96% 99%  Weight:      Height:        Intake/Output Summary (Last 24 hours) at 08/11/2018 1903 Last data filed at 08/11/2018 1700 Gross per 24 hour  Intake 1090.44 ml  Output 2000 ml  Net -909.56 ml   Filed Weights   08/09/18 0619 08/10/18 0556 08/11/18 0700  Weight: 61.8 kg 60.5 kg 64.1 kg    Exam:  . General: 68 y.o. year-old female well-developed well-nourished in no acute distress.  Alert and oriented x3.   . Cardiovascular: Regular rate and rhythm no rubs or gallops no JVD or thyromegaly.   [name] Respiratory: Clear to auscultation.  No wheezes or rales.  Poor inspiratory effort.   . Abdomen: Obese nondistended, nontender.  Positive bowel sounds present.. .  Musculoskeletal: Trace lower extremity edema.  2 out of 4 pulses in all 4 extremities.   [name] Psychiatry: Mood is appropriate for condition and setting.  Data Reviewed: CBC: Recent Labs  Lab 08/07/18 0502 08/08/18 0423 08/09/18 0500 08/10/18 0513 08/11/18 0543  WBC 5.0 5.2 5.2 4.6 3.7*  HGB 8.4* 8.5* 8.6* 9.0* 8.5*  HCT 27.7* 27.7* 28.2* 29.9* 28.2*  MCV 99.6 99.6 102.2* 101.7* 102.2*  PLT 225 207 220 228 833   Basic Metabolic Panel: Recent Labs  Lab 08/09/18 0500   NA 136  K 5.4*  CL 98  CO2 31  GLUCOSE 91  BUN 17  CREATININE 0.88  CALCIUM 8.7*  MG 1.8  PHOS 4.5   GFR: Estimated Creatinine Clearance: 52.8 mL/min (by C-G formula based on SCr of 0.88 mg/dL). Liver Function Tests: No results for input(s): AST, ALT, ALKPHOS, BILITOT, PROT, ALBUMIN in the last 168 hours. No results for input(s): LIPASE, AMYLASE in the last 168 hours. No results for input(s): AMMONIA in the last 168 hours. Coagulation Profile: Recent Labs  Lab 08/07/18 0502 08/08/18 0423 08/09/18 0500 08/10/18 0513 08/11/18 0543  INR 1.2 1.4* 1.5* 1.8* 2.4*   Cardiac Enzymes: No results for input(s): CKTOTAL, CKMB, CKMBINDEX, TROPONINI in the last 168 hours. BNP (last 3 results) No results for input(s): PROBNP in the last 8760 hours. HbA1C: No results for input(s): HGBA1C in the last 72 hours. CBG: No results for input(s): GLUCAP in the last 168 hours. Lipid Profile: No results for input(s): CHOL, HDL, LDLCALC, TRIG, CHOLHDL, LDLDIRECT in the last 72 hours. Thyroid Function Tests: No results for input(s): TSH, T4TOTAL, FREET4, T3FREE, THYROIDAB in the last 72 hours. Anemia Panel: No results for input(s): VITAMINB12, FOLATE, FERRITIN, TIBC, IRON, RETICCTPCT in the last 72 hours. Urine analysis:    Component Value Date/Time   COLORURINE YELLOW 05/23/2018 [place] 05/23/2018 0934  LABSPEC 1.014 05/23/2018 0934   PHURINE 7.0 05/23/2018 0934   GLUCOSEU NEGATIVE 05/23/2018 0934   HGBUR SMALL (A) 05/23/2018 0934   BILIRUBINUR NEGATIVE 05/23/2018 0934   BILIRUBINUR n 09/21/2016 1432   KETONESUR NEGATIVE 05/23/2018 0934   PROTEINUR 30 (A) 05/23/2018 0934   UROBILINOGEN 0.2 09/21/2016 1432   UROBILINOGEN 0.2 04/13/2009 2025   NITRITE NEGATIVE 05/23/2018 0934   LEUKOCYTESUR NEGATIVE 05/23/2018 0934   Sepsis Labs: _0 (procalcitonin:4,lacticidven:4)  )No results found for this or any previous visit (from the past 240 hour(s)).    Studies:  No results found.  Scheduled Meds: . albuterol  2.5 mg Nebulization BID  . arformoterol  15 mcg Nebulization BID  . budesonide (PULMICORT) nebulizer solution  0.5 mg Nebulization BID  . buPROPion  50 mg Oral BID  . citalopram  10 mg Oral Daily  . glycopyrrolate  0.2 mg Intravenous BID  . guaiFENesin  600 mg Oral BID  . levothyroxine  125 mcg Oral Q0600  . lidocaine  1 patch Transdermal Q24H  . mouth rinse  15 mL Mouth Rinse BID  . pantoprazole  40 mg Oral BID  . polyethylene glycol  17 g Oral Daily  . senna  2 tablet Oral QHS  . sodium chloride flush  10-40 mL Intracatheter Q12H  . ticagrelor  60 mg Oral BID  . Warfarin - Pharmacist Dosing Inpatient   Does not apply q1800    Continuous Infusions: . sodium chloride 10 mL/hr at 07/31/18 1800     LOS: 25 days     Torunn Chancellor J [place] DO [facility] Pager [phone]  If 7PM-7AM, please contact night-coverage [email or web address] Password [email or web address] 08/11/2018, 7:03 PM

## 2018-08-11 NOTE — Progress Notes (Signed)
Occupational Therapy Treatment Patient Details Name: [name] MRN: [id number] DOB: January 24, 1950 Today's Date: 08/11/2018    History of present illness 68 year old with Factor V Leiden on warfarin presented with hematemesis. Coded in [facility] ED for 12 minutes. Extubated 7/9, back to ICU 7/11 for mucus plug and L lung collapse s/p thoracentesis and reintubated 7/11-7/13. 7/16 PEA arrest. Intubated and transferred to ICU, repeat broncoscopy 7/17. Extubated 7/21. PMH: COPD, MI, HTN,anteriosclerotic cardiovascular disease, chronic respiratory failure, tobacco use, and PE.    OT comments  Pt significantly limited by pain this session. Upon arrival pt saturated in urine, NT arrived to assist with pericare. Pt required minguard for rolling and totalA for posterior pericare. Pt required setupA for grooming while seated in bed. Continued to educate pt on general UE HEP. Pt will continue to benefit from skilled OT services to maximize safety and independence with ADL/IADL and functional mobility. Will continue to follow acutely and progress as tolerated.    Follow Up Recommendations  SNF;Supervision/Assistance - 24 hour    Equipment Recommendations  Other (comment)(defer to next venue)    Recommendations for Other Services PT consult    Precautions / Restrictions Precautions Precautions: Fall Restrictions Weight Bearing Restrictions: No       Mobility Bed Mobility Overal bed mobility: Needs Assistance Bed Mobility: Rolling Rolling: Min guard         General bed mobility comments: minguard for rolling in bed  Transfers                 General transfer comment: deferred    Balance                                           ADL either performed or assessed with clinical judgement   ADL Overall ADL's : Needs assistance/impaired     Grooming: Set up;Sitting Grooming Details (indicate cue type and reason): set;sitting supported in bed                  Toilet Transfer: Total assistance;+2 for physical assistance;Min guard   Toileting- Clothing Manipulation and Hygiene: Total assistance;+2 for physical assistance;Min guard Toileting - Clothing Manipulation Details (indicate cue type and reason): pt incontinent upon arrival;totalA for pericare but minguard for rolling       General ADL Comments: significantly limited by pain;NT present to assist      Vision       Perception     Praxis      Cognition Arousal/Alertness: Awake/alert Behavior During Therapy: Flat affect Overall Cognitive Status: Within Functional Limits for tasks assessed                                          Exercises Exercises: General Upper Extremity General Exercises - Upper Extremity Shoulder Flexion: AAROM;Both;10 reps;Supine Elbow Flexion: AROM;Both;10 reps;Supine Elbow Extension: AROM;Both;10 reps;Supine Wrist Flexion: AROM;Both;10 reps;Supine Wrist Extension: AROM;Both;10 reps;Supine Digit Composite Flexion: AROM;Both;10 reps;Supine Composite Extension: AROM;Both;10 reps;Supine   Shoulder Instructions       General Comments VSS;educated pt on importance of participating with therapy, pt stated she  "will do it if I get pain meds first"    Pertinent Vitals/ Pain       Pain Assessment: 0-10 Pain Score: 10-Worst pain ever Pain Location: chest  primarily but all over Pain Descriptors / Indicators: Aching;Discomfort;Sore;Grimacing Pain Intervention(s): Limited activity within patient's tolerance;Monitored during session  Home Living                                          Prior Functioning/Environment              Frequency  Min 2X/week        Progress Toward Goals  OT Goals(current goals can now be found in the care plan section)  Progress towards OT goals: Not progressing toward goals - comment(limited by pain)  Acute Rehab OT Goals Patient Stated Goal: decrease pain OT Goal  Formulation: With patient Time For Goal Achievement: 08/14/18 Potential to Achieve Goals: Good ADL Goals Pt Will Perform Grooming: with set-up;with supervision;standing Pt Will Perform Lower Body Dressing: sit to/from stand;with supervision;with set-up Pt Will Transfer to Toilet: with set-up;with supervision;ambulating;regular height toilet Pt Will Perform Toileting - Clothing Manipulation and hygiene: with set-up;with supervision;sit to/from stand;sitting/lateral leans Additional ADL Goal #1: Pt will demonstrate selective attention to perform ADL in distracting environment with 2-3 cues  Plan Frequency remains appropriate;Discharge plan needs to be updated    Co-evaluation                 AM-PAC OT "6 Clicks" Daily Activity     Outcome Measure   Help from another person eating meals?: A Little Help from another person taking care of personal grooming?: A Little Help from another person toileting, which includes using toliet, bedpan, or urinal?: Total Help from another person bathing (including washing, rinsing, drying)?: A Lot Help from another person to put on and taking off regular upper body clothing?: A Lot Help from another person to put on and taking off regular lower body clothing?: Total 6 Click Score: 12    End of Session Equipment Utilized During Treatment: Oxygen  OT Visit Diagnosis: Unsteadiness on feet (R26.81);Other abnormalities of gait and mobility (R26.89);Muscle weakness (generalized) (M62.81);Other symptoms and signs involving cognitive function   Activity Tolerance Patient tolerated treatment well   Patient Left in bed;with call bell/phone within reach;with bed alarm set   Nurse Communication Mobility status        Time: 6546-5035 OT Time Calculation (min): 16 min  Charges: OT General Charges $OT Visit: 1 Visit OT Treatments $Self Care/Home Management : 8-22 mins  Dorinda Hill OTR/L [place] Office:  [place] 08/11/2018, 3:33 PM

## 2018-08-11 NOTE — Progress Notes (Signed)
ANTICOAGULATION CONSULT NOTE - Follow Up Consult  Pharmacy Consult for Heparin   Indication: hx PE/DVT in setting of Factor V Leiden  Patient Measurements: Height: 5\' 4"  (162.6 cm) Weight: 141 lb 5 oz (64.1 kg) IBW/kg (Calculated) : 54.7  Vital Signs: Temp: 98 F (36.7 C) (08/02 0700) Temp Source: Oral (08/02 0700) BP: 97/50 (08/02 0700) Pulse Rate: 75 (08/02 0700)  Labs: Recent Labs    08/09/18 0500 08/09/18 0747 08/10/18 0513 08/11/18 0543  HGB 8.6*  --  9.0* 8.5*  HCT 28.2*  --  29.9* 28.2*  PLT 220  --  228 211  LABPROT 18.0*  --  21.0* 25.7*  INR 1.5*  --  1.8* 2.4*  HEPARINUNFRC 0.98* 0.44 0.52 0.47  CREATININE 0.88  --   --   --     Estimated Creatinine Clearance: 52.8 mL/min (by C-G formula based on SCr of 0.88 mg/dL).  Assessment: 43 yoF with hx PE/DVT in setting of Factor V Leiden on warfarin PTA which was reversed with KCentra and Vitamin K on 07/17/18 d/t coagulapathy. No current plans for EGD per GI so CCM ordered heparin to start on 7/13.   Restarted warfarin with heparin bridge on 7/24. Warfarin was held on 7/25. After discussion with MD, ok to increase heparin goal from 0.15-0.2 given resolution of bleeding and restarting warfarin.   Warfarin regimen PTA: 5 mg MWFSun, 6 mg TTSat (1 mg tablet).   Today's heparin level is therapeutic at 0.47 after rate decrease to 950 units/hour (goal of 0.2-0.5).  INR is therapeutic at 2.4 (goal of 2-3).  Hgb low but stable at 8.5. Plt 211. No signs of bleeding or infusion issues per nursing.   Goal of Therapy:  Heparin level goal 0.2-0.5 units/mL INR goal 2-3 Monitor platelets by anticoagulation protocol: Yes   Plan:  - Discontinue heparin infusion - Warfarin 7.5 mg x 1 dose at 1800 today - Monitor daily PT/INR, CBC and s/sx of bleeding  Thank you for allowing pharmacy to be a part of this patient's care.  Berenice Bouton, PharmD PGY1 Pharmacy Resident Office phone: [phone] 08/11/2018

## 2018-08-12 DIAGNOSIS — I2510 Atherosclerotic heart disease of native coronary artery without angina pectoris: Secondary | ICD-10-CM

## 2018-08-12 DIAGNOSIS — I10 Essential (primary) hypertension: Secondary | ICD-10-CM

## 2018-08-12 LAB — CBC
HCT: 29.5 % — ABNORMAL LOW (ref 36.0–46.0)
Hemoglobin: 9 g/dL — ABNORMAL LOW (ref 12.0–15.0)
MCH: 30.9 pg (ref 26.0–34.0)
MCHC: 30.5 g/dL (ref 30.0–36.0)
MCV: 101.4 fL — ABNORMAL HIGH (ref 80.0–100.0)
Platelets: 218 10*3/uL (ref 150–400)
RBC: 2.91 MIL/uL — ABNORMAL LOW (ref 3.87–5.11)
RDW: 16.2 % — ABNORMAL HIGH (ref 11.5–15.5)
WBC: 4.8 10*3/uL (ref 4.0–10.5)
nRBC: 0 % (ref 0.0–0.2)

## 2018-08-12 LAB — BASIC METABOLIC PANEL
Anion gap: 9 (ref 5–15)
BUN: 24 mg/dL — ABNORMAL HIGH (ref 8–23)
CO2: 31 mmol/L (ref 22–32)
Calcium: 8.8 mg/dL — ABNORMAL LOW (ref 8.9–10.3)
Chloride: 92 mmol/L — ABNORMAL LOW (ref 98–111)
Creatinine, Ser: 1.21 mg/dL — ABNORMAL HIGH (ref 0.44–1.00)
GFR calc Af Amer: 53 mL/min — ABNORMAL LOW (ref >60)
GFR calc non Af Amer: 46 mL/min — ABNORMAL LOW (ref >60)
Glucose, Bld: 89 mg/dL (ref 70–99)
Potassium: 4.7 mmol/L (ref 3.5–5.1)
Sodium: 132 mmol/L — ABNORMAL LOW (ref 135–145)

## 2018-08-12 LAB — PROTIME-INR
INR: 2.4 — ABNORMAL HIGH (ref 0.8–1.2)
Prothrombin Time: 25.9 seconds — ABNORMAL HIGH (ref 11.4–15.2)

## 2018-08-12 MED ORDER — SENNA 8.6 MG PO TABS: 2.0000 | ORAL_TABLET | Freq: Every day | ORAL | 0 refills | Status: DC

## 2018-08-12 MED ORDER — ARFORMOTEROL TARTRATE 15 MCG/2ML IN NEBU: 15.0000 ug | INHALATION_SOLUTION | Freq: Two times a day (BID) | RESPIRATORY_TRACT | 6 refills | Status: DC

## 2018-08-12 MED ORDER — OXYCODONE-ACETAMINOPHEN 5-325 MG PO TABS: 1.0000 | ORAL_TABLET | Freq: Four times a day (QID) | ORAL | 0 refills | Status: AC | PRN

## 2018-08-12 MED ORDER — BISOPROLOL FUMARATE 5 MG PO TABS: 2.5000 mg | ORAL_TABLET | Freq: Every day | ORAL | 0 refills | Status: DC

## 2018-08-12 MED ORDER — WARFARIN SODIUM 7.5 MG PO TABS: ORAL_TABLET | ORAL | 11 refills | Status: DC

## 2018-08-12 MED ORDER — BISACODYL 5 MG PO TBEC: 5.0000 mg | DELAYED_RELEASE_TABLET | Freq: Every day | ORAL | 0 refills | Status: DC | PRN

## 2018-08-12 MED ORDER — POLYETHYLENE GLYCOL 3350 17 G PO PACK: 17.0000 g | PACK | Freq: Every day | ORAL | 0 refills | Status: DC | PRN

## 2018-08-12 MED ORDER — WARFARIN SODIUM 7.5 MG PO TABS: 7.5000 mg | ORAL_TABLET | ORAL | Status: DC

## 2018-08-12 MED ORDER — RESOURCE THICKENUP CLEAR PO POWD: ORAL | 0 refills | Status: AC

## 2018-08-12 MED ORDER — BUPROPION HCL 100 MG PO TABS: 50.0000 mg | ORAL_TABLET | Freq: Two times a day (BID) | ORAL | 0 refills | Status: DC

## 2018-08-12 MED ORDER — WARFARIN SODIUM 5 MG PO TABS: ORAL_TABLET | ORAL | 11 refills | Status: DC

## 2018-08-12 MED ORDER — PANTOPRAZOLE SODIUM 40 MG PO TBEC: 40.0000 mg | DELAYED_RELEASE_TABLET | Freq: Two times a day (BID) | ORAL | 0 refills | Status: AC

## 2018-08-12 MED ORDER — LEVOTHYROXINE SODIUM 125 MCG PO TABS: 125.0000 ug | ORAL_TABLET | Freq: Every day | ORAL | 0 refills | Status: DC

## 2018-08-12 MED ORDER — LIDOCAINE 5 % EX PTCH: 1.0000 | MEDICATED_PATCH | CUTANEOUS | 0 refills | Status: DC

## 2018-08-12 MED ORDER — TICAGRELOR 60 MG PO TABS: 60.0000 mg | ORAL_TABLET | Freq: Two times a day (BID) | ORAL | 0 refills | Status: DC

## 2018-08-12 MED ORDER — ROSUVASTATIN CALCIUM 5 MG PO TABS: 5.0000 mg | ORAL_TABLET | Freq: Every day | ORAL | 0 refills | Status: DC

## 2018-08-12 MED ORDER — CITALOPRAM HYDROBROMIDE 10 MG PO TABS: 10.0000 mg | ORAL_TABLET | Freq: Every day | ORAL | 0 refills | Status: DC

## 2018-08-12 NOTE — Consult Note (Signed)
   [facility] CM Inpatient Consult   08/12/2018  [name] Sep 17, 1950 [id number]  Follow up:  LLOS/Disposition  Chart reviewed for disposition and the patient is currently for skilled nursing facility at [facility].  No post [facility] Care Management needs at this time patient disposition needs will be met in a facility .  For questions, please contact:  Natividad Brood, RN BSN [facility] Liaison  [phone] business mobile phone Toll free office [phone]  Fax number: [phone] [name]

## 2018-08-12 NOTE — TOC Transition Note (Signed)
Transition of Care [facility]) - CM/SW Discharge Note   Patient Details  Name: [name] MRN: [id number] Date of Birth: 05-04-1950  [facility] CM/SW Contact:  Eileen Stanford, LCSW Phone Number: 08/12/2018, 11:28 AM   Clinical Narrative:   Clinical Social Worker facilitated patient discharge including contacting patient family and facility to confirm patient discharge plans.  Clinical information faxed to facility and family agreeable with plan.  CSW arranged ambulance transport via [facility] to [facility].  RN to call [phone] for report prior to discharge.   Final next level of care: Skilled Nursing Facility Barriers to Discharge: No Barriers Identified   Patient Goals and CMS Choice Patient states their goals for this hospitalization and ongoing recovery are:: "to be normal"   Choice offered to / list presented to : Patient  Discharge Placement              Patient chooses bed at: [facility] Patient to be transferred to facility by: [place] Name of family member notified: pt alert and oriented Patient and family notified of of transfer: 08/12/18  Discharge Plan and Services In-house Referral: NA Discharge Planning Services: CM Consult Post Acute Care Choice: [place] Arranged: NA          Social Determinants of Health (SDOH) Interventions     Readmission Risk Interventions Readmission Risk Prevention Plan 08/06/2018 07/19/2018 05/29/2018  Transportation Screening Complete - Complete  Medication Review (RN Care Manager) Complete Referral to Pharmacy Complete  PCP or Specialist appointment within 3-5 days of discharge Complete Not Complete -  PCP/Specialist Appt Not Complete comments - Continued medical workup -  HRI or Home Care Consult Complete Complete Complete  SW Recovery Care/Counseling Consult Complete Complete Complete  Palliative Care Screening Not Applicable Not  Applicable Not Applicable  Skilled Nursing Facility Complete Complete Patient Refused  Some recent data might be hidden

## 2018-08-12 NOTE — Progress Notes (Signed)
  Speech Language Pathology Treatment: Dysphagia  Patient Details Name: [name] MRN: [id number] DOB: [birth year] Today's Date: 08/12/2018 Time: 0850-0902 SLP Time Calculation (min) (ACUTE ONLY): 12 min  Assessment / Plan / Recommendation Clinical Impression  Pt has been consuming thin liquids now for approximately a week with no overt indicators of intolerance. She has no overt signs of aspiration, although silent aspiration would not necessarily be detected. She has remained firm in her desire for thin liquids. Education was reinforced about sitting upright, using precautions as risk may still be present for aspiration even intermittently. SLP to sign off acutely.    HPI HPI: On 7/16 pt was intubated for a third time with extubation on 7/21.  Mucous plugging removed with bronchoscopy. Pt has had CPR twice during this admission. RN reports improving cough and vocal quality from 7/21 PM to 7/22 AM.      SLP Plan  All goals met       Recommendations  Diet recommendations: Regular;Thin liquid Liquids provided via: Cup;Straw Medication Administration: Whole meds with liquid Supervision: Patient able to self feed;Intermittent supervision to cue for compensatory strategies Compensations: Slow rate;Small sips/bites Postural Changes and/or Swallow Maneuvers: Seated upright 90 degrees                Oral Care Recommendations: Oral care BID Follow up Recommendations: None SLP Visit Diagnosis: Dysphagia, oropharyngeal phase (R13.12) Plan: All goals met       GO                Venita Sheffield Clancey [place] 08/12/2018, 9:38 AM  Pollyann Glen, M.A. [place] Acute Environmental education officer [phone] Office [phone]

## 2018-08-12 NOTE — Discharge Summary (Signed)
Physician Discharge Summary  Naquita Nappier TWS:[id number] DOB: 15-Aug-1950 DOA: Aug 13, 2018  PCP: [name], NP  Admit date: August 13, 2018 Discharge date: 08/12/2018  Admitted From: Home Disposition: [facility]  Recommendations for Outpatient Follow-up:  1. Follow up with PCP in 1 week 2. Please obtain INR in 3-5 days 3. Continue to monitor INR and titrate Coumadin as needed  Home Health: no Equipment/Devices: None  Discharge Condition: Stable CODE STATUS: Full code Diet recommendation: Regular diet with thin liquids  History of present illness:  68 yr old withCOPD, MI, HTN, and PE,Factor V Leiden onWarfarin(also on Brillinta)presented with large volumehematemesison late hrs of 2018-08-13. Coded in [facility] ED for [place]. Coagulopathy reversed with Vit K, [place].Transferred to [facility]. Received transfusions and GI consulted for EGD ( wanted pt to be off AC for 5 days).Extubatedon 7/9and transferred out to Internal Medicine service.Back to ICU 7/11 for mucus plug and left lung collapses/p thoracentesis was re-intubated and has a bronchoscopy on 7/11/ showed the whole left mainstem bronchus was occluded by mucous plug and thick secretions which extended all the way into the lower lobe. It was suctioned out and BAL was sent which was positive for Stenotrophomonas . Repeat Bronchosopy on 7/13 prior to extubation was negative. Extubated successfully on 7/13 and downgraded to Internal Medicine.  PCCM re-engaged 07/25/18 for increasing oxygen demand, likely pulmonary edema. Evening of 7/16 experienced PEA arrest. Got epi, atropine, and dopamine. Intubated and transferred to the ICU. Repeat bronchoscopy 7/17. And transfer back to [facility] on 08/01/2018.  O2 sat stable on 3L, 100%. Course complicated by slow to improve INR on coumadin requiring bridging with heparin drip, managed by pharmacy.  Hospital course:  Acute on chronic hypoxic respiratory failure post  extubation likely secondary to severe left atelectasis and left lung collapse She has had prior two intubations followed by extubation and 2 bronchoscopies during this hospitalization. Her sputum culture were positive for stenotrophomonas maltophilia and patient completed a course of Bactrim.  Continues on 3 L nasal cannula with adequate oxygenation, recommend continue to likely titrate off.  CAD A new wall motion abnormality on TTE 07/26/2018/now with chest pain: Patient was seen by cardiology at  during this hospitalization but she was too sick to handle any sort of intervention so cardiology signed off. She had chest pain on 08/01/2018.  EKG was done which showed no ST-T wave changes.  Cardiac enzymes were negative.  Her chest pain has resolved.  Cardiology recommended to continue [place].  Factor V Leyden on Coumadin INR is improved 2.4 on 08/11/2018.  Continue Coumadin 5 mg p.o. daily on Monday/Wednesday/Friday/Sunday.  7.5 mg p.o. on Tuesday, Thursday, Saturday.  Goal INR between 2-3.  Recommend repeat INR in 3-5 days following hospitalization and further titration if necessary.  Dysphasia Evaluated by speech therapy, recommendations on discharge are heart healthy diet with thin liquids.  Continue aspiration precautions.  Paroxysmal A. fib Rate controlled continue anticoagulation with Coumadin.  INR therapeutic on discharge, 2.4.  Resolved chronic constipation Continue stool softeners and MiraLAX as needed  Mid chest tenderness with palpation possibly related to recent chest compression with cardiac arrest Percocet as needed  Acute blood loss anemia from GI bleed Patient presenting with hematemesis on admission.  Had previous EGD done in May 2020 without any significant findings.  Gastroenterology was consulted for possible repeat EGD during this admission.  Her anticoagulants were held.  She did not require any blood transfusion.  Her hemoglobin remained stable and GI deferred any  further invasive procedures such as EGD at this point.  Her hemoglobin was closely monitored and her anticoagulants with Coumadin, Brilinta were restarted.  Hemoglobin at time of discharge was 9.0.  Recent cardiac arrest x2 07/16/18 [facility] and 07/25/18 [facility] Continue Lidoderm patch.  Left mainstem bronchus Mucous plug/left lung collapse post thoracentesis 07/20/18 (320cc) and bronchoscopy Underwent bronchoscopies x2 while inpatient.  Continue to encourage cough and airway clearance.  Coronary artery disease Continue Brilinta  Hypothyroidism-continue levothyroxine  Generalized weakness/physical debility Discharge to SNF.  Chronic anxiety/depression Continue Celexa and Wellbutrin.  Goals of care Patient was followed by palliative care while inpatient. DNR changed to full code on 08/06/2018  Discharge Diagnoses:  Active Problems:   Endobronchial mass    Discharge Instructions  Discharge Instructions    Call MD for:  difficulty breathing, headache or visual disturbances   Complete by: As directed    Call MD for:  extreme fatigue   Complete by: As directed    Call MD for:  hives   Complete by: As directed    Call MD for:  persistant dizziness or light-headedness   Complete by: As directed    Call MD for:  persistant nausea and vomiting   Complete by: As directed    Call MD for:  redness, tenderness, or signs of infection (pain, swelling, redness, odor or green/yellow discharge around incision site)   Complete by: As directed    Call MD for:  severe uncontrolled pain   Complete by: As directed    Call MD for:  temperature >100.4   Complete by: As directed    Diet - low sodium heart healthy   Complete by: As directed    Increase activity slowly   Complete by: As directed      Allergies as of 08/12/2018      Reactions   Dilaudid [hydromorphone Hcl] Hives, Nausea Only   Minocycline Hcl    REACTION: Dizzy   Prednisone    REACTION: feels like throat swelling,  hallucinations   Varenicline Tartrate    REACTION: Dizzy(chantix)   Zocor [simvastatin - High Dose] Other (See Comments)   myalgia      Medication List    STOP taking these medications   baclofen 10 MG tablet Commonly known as: LIORESAL   buPROPion 100 MG 12 hr tablet Commonly known as: WELLBUTRIN SR Replaced by: buPROPion 100 MG tablet   enoxaparin 120 MG/0.8ML injection Commonly known as: LOVENOX   ferrous sulfate 325 (65 FE) MG tablet   HYDROcodone-acetaminophen 5-325 MG tablet Commonly known as: NORCO/VICODIN   traZODone 50 MG tablet Commonly known as: DESYREL     TAKE these medications   albuterol (2.5 MG/3ML) 0.083% nebulizer solution Commonly known as: PROVENTIL USE 1 VIAL BY NEBULIZER EVERY 4 HOURS AS NEEDED FOR WHEEZING. DX: J44.9 What changed:   how much to take  how to take this  when to take this  reasons to take this  additional instructions   arformoterol 15 MCG/2ML Nebu Commonly known as: BROVANA Take 2 mLs (15 mcg total) by nebulization 2 (two) times daily.   bisacodyl 5 MG EC tablet Commonly known as: DULCOLAX Take 1 tablet (5 mg total) by mouth daily as needed for mild constipation.   bisoprolol 5 MG tablet Commonly known as: ZEBETA Take 0.5 tablets (2.5 mg total) by mouth daily.   budesonide 0.5 MG/2ML nebulizer solution Commonly known as: Pulmicort Take 2 mLs (0.5 mg total) by nebulization 2 (two) times daily. Dx: L97.9  buPROPion 100 MG tablet Commonly known as: WELLBUTRIN Take 0.5 tablets (50 mg total) by mouth 2 (two) times daily. Replaces: buPROPion 100 MG 12 hr tablet   citalopram 10 MG tablet Commonly known as: CELEXA Take 1 tablet (10 mg total) by mouth daily.   Dry Eye Relief Drops 0.2-0.2-1 % Soln Generic drug: Glycerin-Hypromellose-PEG 400 Place 1-2 drops into both eyes 2 (two) times a day.   Fish Oil 600 MG Caps Take 1 capsule by mouth daily.   Flutter Devi Use as directed   ipratropium-albuterol 0.5-2.5  (3) MG/3ML Soln Commonly known as: DUONEB Take 3 mLs by nebulization 4 (four) times daily.   levothyroxine 125 MCG tablet Commonly known as: SYNTHROID Take 1 tablet (125 mcg total) by mouth daily.   lidocaine 5 % Commonly known as: LIDODERM Place 1 patch onto the skin daily. Remove & Discard patch within 12 hours or as directed by MD Start taking on: August 13, 2018   nitroGLYCERIN 0.4 MG SL tablet Commonly known as: NITROSTAT Place 1 tablet (0.4 mg total) under the tongue every 5 (five) minutes as needed for chest pain.   oxyCODONE-acetaminophen 5-325 MG tablet Commonly known as: PERCOCET/ROXICET Take 1 tablet by mouth every 6 (six) hours as needed for up to 7 days for moderate pain.   pantoprazole 40 MG tablet Commonly known as: PROTONIX Take 1 tablet (40 mg total) by mouth 2 (two) times daily. What changed: when to take this   polyethylene glycol 17 g packet Commonly known as: MIRALAX / GLYCOLAX Take 17 g by mouth daily as needed for mild constipation.   Resource ThickenUp Clear Powd Use with meals   rosuvastatin 5 MG tablet Commonly known as: CRESTOR Take 1 tablet (5 mg total) by mouth daily at 6 PM.   senna 8.6 MG Tabs tablet Commonly known as: SENOKOT Take 2 tablets (17.2 mg total) by mouth at bedtime.   ticagrelor 60 MG Tabs tablet Commonly known as: Brilinta Take 1 tablet (60 mg total) by mouth 2 (two) times daily.   warfarin 5 MG tablet Commonly known as: Coumadin Take as directed. If you are unsure how to take this medication, talk to your nurse or doctor. Original instructions: 59m on Monday, Wednesday, Friday, Sunday What changed:   medication strength  how much to take  how to take this  when to take this  additional instructions   warfarin 7.5 MG tablet Commonly known as: Coumadin Take as directed. If you are unsure how to take this medication, talk to your nurse or doctor. Original instructions: 7.5 mg on Tuesday, Thursday, Saturday What  changed: You were already taking a medication with the same name, and this prescription was added. Make sure you understand how and when to take each.       Contact information for follow-up providers    NDorothyann Peng NP. Schedule an appointment as soon as possible for a visit in 1 week(s).   Specialty: Family Medicine Contact information: [phone]        KHerminio Commons MD .   Specialty: Cardiology Contact information: [place] [phone]           Contact information for after-discharge care    [facility]   Service: Skilled NChiropodistinformation: [phone]  Allergies  Allergen Reactions  . Dilaudid [Hydromorphone Hcl] Hives and Nausea Only  . Minocycline Hcl     REACTION: Dizzy  . Prednisone     REACTION: feels like throat swelling, hallucinations  . Varenicline Tartrate     REACTION: Dizzy(chantix)   . Zocor [Simvastatin - High Dose] Other (See Comments)    myalgia    Consultations:  Gastroenterology  PCCM  Cardiology   Procedures/Studies: Dg Chest 1 View  Result Date: 07/25/2018 CLINICAL DATA:  ETT placement EXAM: CHEST  1 VIEW COMPARISON:  07/25/2018, 07/22/2018, 07/21/2018 FINDINGS: Interval intubation, tip of the endotracheal tube is about 4.5 cm superior to the carina. Right upper extremity catheter tip over the SVC. Esophageal tube tip below the diaphragm but non included. Small right and small moderate left pleural effusion. Cardiomegaly with vascular congestion. Diffuse interstitial opacity suspect for edema. Continued consolidation at the left base. IMPRESSION: 1. Endotracheal tube tip about 4.5 cm superior to carina. Esophageal tube tip below the diaphragm but incompletely visualized 2. Cardiomegaly with small right and small moderate  left pleural effusions. Cardiomegaly with vascular congestion and interstitial pulmonary edema 3. No change in dense atelectasis or pneumonia at the left base Electronically Signed   By: Donavan Foil M.D.   On: 07/25/2018 20:47   Dg Chest 1 View  Result Date: 07/20/2018 CLINICAL DATA:  Status post left thoracentesis today. EXAM: CHEST  1 VIEW COMPARISON:  Single-view of the chest 07/20/2018. FINDINGS: Left pleural effusion is slightly decreased after thoracentesis. No pneumothorax. Only the upper left chest is aerated. Very small right pleural effusion and basilar airspace disease noted. Lungs are emphysematous. Cardiac silhouette is shifted to the left. IMPRESSION: Negative for pneumothorax after left thoracentesis. Slight decrease in left pleural effusion and airspace disease. Small right pleural effusion and basilar atelectasis. Emphysema. Electronically Signed   By: Inge Rise M.D.   On: 07/20/2018 15:02   Dg Chest 1 View  Result Date: 07/18/2018 CLINICAL DATA:  Respiratory failure. EXAM: CHEST  1 VIEW COMPARISON:  Multiple previous chest x-rays. The most recent is 07/16/2018 FINDINGS: The endotracheal tube is 4.8 cm above the carina. External pacer paddles are noted. The heart is enlarged but stable. The lungs demonstrate much improved aeration with resolving edema and atelectasis. Persistent small effusions. IMPRESSION: 1. ET tube in good position. 2. Much improved lung aeration with resolving edema and atelectasis. Persistent small effusions. Electronically Signed   By: Marijo Sanes M.D.   On: 07/18/2018 11:51   Dg Abd 1 View  Result Date: 08/01/2018 CLINICAL DATA:  Pain. EXAM: ABDOMEN - 1 VIEW COMPARISON:  None. FINDINGS: No free air, portal venous gas, or pneumatosis identified. No evidence of bowel obstruction. No cause for the patient's pain identified. IMPRESSION: No cause for pain identified. Electronically Signed   By: Dorise Bullion III M.D   On: 08/01/2018 09:02   Dg Abd 1  View  Result Date: 07/20/2018 CLINICAL DATA:  Evaluate OG tube placement EXAM: ABDOMEN - 1 VIEW COMPARISON:  None. FINDINGS: The distal tip the OG tube is in the distal stomach. IMPRESSION: The distal tip of the OG tube is in the distal stomach. Electronically Signed   By: Dorise Bullion III M.D   On: 07/20/2018 16:49   Dg Chest Port 1 View  Result Date: 08/01/2018 CLINICAL DATA:  Shortness of breath and upper abdominal pain. EXAM: PORTABLE CHEST 1 VIEW COMPARISON:  July 30, 2018 FINDINGS: The ET and NG tubes have been removed. The right PICC  line terminates in the central SVC. Small bilateral pleural effusions are identified with underlying atelectasis. Increased interstitial opacities are seen in the lungs, right greater than left. No other acute abnormalities identified. IMPRESSION: 1. Support apparatus as above. 2. Small bilateral pleural effusions with underlying atelectasis. 3. Mild pulmonary edema. Electronically Signed   By: Dorise Bullion III M.D   On: 08/01/2018 09:01   Dg Chest Port 1 View  Result Date: 07/30/2018 CLINICAL DATA:  Respiratory failure EXAM: PORTABLE CHEST 1 VIEW COMPARISON:  Yesterday FINDINGS: Endotracheal tube tip between the clavicular heads and carina. The orogastric tube reaches the stomach. Right PICC with tip at the distal SVC. Cardiomegaly with diffuse interstitial coarsening and small pleural effusions. No pneumothorax. Chronic hyperinflation. IMPRESSION: 1. Unremarkable hardware positioning. 2. Borderline edema when accounting for emphysema. Small pleural effusions and asymmetric retrocardiac density. 3. No significant change from yesterday. Electronically Signed   By: Monte Fantasia M.D.   On: 07/30/2018 07:45   Dg Chest Port 1 View  Result Date: 07/29/2018 CLINICAL DATA:  ET tube placement. EXAM: PORTABLE CHEST 1 VIEW COMPARISON:  07/29/2018 FINDINGS: Advancement of the endotracheal tube since earlier study, now 2.6 cm above the carina. NG tube is in the  stomach. Right PICC line is in place with the tip in the SVC. Mild cardiomegaly. Improving aeration in the left lung. Residual left perihilar and lower lobe atelectasis or filtrate. Small bilateral effusions. IMPRESSION: Improving aeration on the left. Continued perihilar and lower lobe atelectasis or infiltrates on the left. Small bilateral effusions. Electronically Signed   By: Rolm Baptise M.D.   On: 07/29/2018 10:56   Dg Chest Port 1 View  Result Date: 07/29/2018 CLINICAL DATA:  Hypoxia and respiratory failure. History of left pneumothorax and mucous plugging. EXAM: PORTABLE CHEST 1 VIEW COMPARISON:  Single-view of the chest 07/28/2018 and 07/26/2018. FINDINGS: Support tubes and lines are unchanged. Left effusion and extensive airspace disease persist but have mildly improved since the most recent study. Small right effusion is noted. Right lung is clear. Emphysema. Cardiac silhouette is largely obscured. IMPRESSION: Extensive left-sided airspace disease and a left pleural effusion have slightly improved since the most recent study. Trace right pleural effusion. Emphysema. No change in support apparatus. Electronically Signed   By: Inge Rise M.D.   On: 07/29/2018 07:56   Dg Chest Port 1 View  Result Date: 07/28/2018 CLINICAL DATA:  Acute respiratory failure with hypoxia. EXAM: PORTABLE CHEST 1 VIEW COMPARISON:  Chest x-rays dated 07/26/2018. FINDINGS: Support apparatus is stable in position. Continued improvement of aeration within the LEFT upper lung. RIGHT lung remains clear. IMPRESSION: Continued improvement of aeration within the LEFT upper lung. Electronically Signed   By: Franki Cabot M.D.   On: 07/28/2018 08:38   Dg Chest Port 1 View  Result Date: 07/26/2018 CLINICAL DATA:  Status post bronchoscopy, respiratory distress EXAM: PORTABLE CHEST 1 VIEW COMPARISON:  Film from earlier in the same day. FINDINGS: Endotracheal tube, nasogastric catheter and right-sided PICC line are again seen  and stable. There is some improved aeration in the left lung following bronchoscopy although some persistent effusion remains. Right lung is hyperinflated but clear. No other focal abnormality is noted. IMPRESSION: Improved aeration in the left lung following bronchoscopy although persistent effusion remains. Electronically Signed   By: Inez Catalina M.D.   On: 07/26/2018 11:27   Dg Chest Port 1 View  Result Date: 07/26/2018 CLINICAL DATA:  Order for acute on chronic respiratory failure with hypoxia EXAM: PORTABLE CHEST  1 VIEW COMPARISON:  07/25/2018 and earlier studies. FINDINGS: There is now complete opacification of the left hemithorax. Right lung is hyperexpanded prominent bronchovascular markings, but otherwise clear and stable. Probable small right effusion. No pneumothorax. Endotracheal tube, right PICC and nasal/orogastric tube are stable. IMPRESSION: 1. Complete opacification of the left hemithorax developing since the previous day's study. Findings are likely a combination of pleural fluid and atelectasis/pneumonia. 2. No other change.  Stable support apparatus. Electronically Signed   By: Lajean Manes M.D.   On: 07/26/2018 09:10   Dg Chest Port 1 View  Result Date: 07/25/2018 CLINICAL DATA:  Shortness of breath.  History of pulmonary embolism. EXAM: PORTABLE CHEST 1 VIEW COMPARISON:  July 25, 2018 FINDINGS: Diffuse interstitial opacities. Small right effusion. Effusion and opacity in left base is similar to mildly worsened. The right PICC line is in stable position. Nipple shadows seen on the right. No other acute abnormalities. IMPRESSION: 1. Effusion and opacity in left base has worsened. The underlying opacity could be atelectasis. However, the findings are at least somewhat concerning for pneumonia or aspiration. Recommend clinical correlation. 2. Small right pleural effusion. 3. Mild pulmonary edema. Electronically Signed   By: Dorise Bullion III M.D   On: 07/25/2018 15:13   Dg Chest Port  1 View  Result Date: 07/25/2018 CLINICAL DATA:  Recent CPR.  Shortness of breath and hemoptysis EXAM: PORTABLE CHEST 1 VIEW COMPARISON:  June 22, 2018 FINDINGS: Endotracheal tube and nasogastric tube have been removed. Central catheter tip is in the superior vena cava. No pneumothorax. There is airspace consolidation in the left lower lobe with left pleural effusion and atelectasis. There is a small right pleural effusion with mild right base atelectasis. There is underlying interstitial thickening which may represent mild interstitial edema. There is cardiomegaly with pulmonary venous hypertension. No adenopathy. There is aortic atherosclerosis. No bone lesions evident. IMPRESSION: Left lower lobe airspace consolidation consistent with pneumonia or aspiration. There is a small left pleural effusion. There is also a small right pleural effusion. There are areas of atelectatic change in each lower lobe. There is pulmonary vascular congestion with apparent mild interstitial edema. There may be a degree of superimposed congestive heart failure. Central catheter tip in superior vena cava. Aortic Atherosclerosis (ICD10-I70.0). Electronically Signed   By: Lowella Grip III M.D.   On: 07/25/2018 08:16   Dg Chest Port 1 View  Result Date: 07/22/2018 CLINICAL DATA:  68 year old female with respiratory failure EXAM: PORTABLE CHEST 1 VIEW COMPARISON:  Prior chest x-ray 07/21/2018 FINDINGS: The endotracheal tube is 2.2 cm above the carina. Interval placement of a right upper extremity PICC. The catheter tip overlies the mid SVC. A gastric tube is present, the tip lies off the field of view, below the diaphragm and presumably within the stomach. No significant interval change in the appearance of the lungs. Persistent background chronic bronchitic changes. Bibasilar airspace opacities are again evident more confluent on the left than the right. The left lower lobe airspace opacity may reflect a combination of pleural  fluid and atelectasis or infiltrate. On the right, there is likely a small effusion combined with atelectasis. No pneumothorax. No acute osseous abnormality. IMPRESSION: 1. New right upper extremity PICC. Catheter tip projects over the mid SVC. 2. The tip of the endotracheal tube is 2.2 cm above the carina. 3. Persistent dense left basilar opacity likely representing a combination of pleural effusion and atelectasis versus infiltrate. 4. Small right pleural effusion with associated atelectasis. 5. Chronic bronchitic changes.  Electronically Signed   By: Jacqulynn Cadet M.D.   On: 07/22/2018 07:44   Dg Chest Port 1 View  Result Date: 07/21/2018 CLINICAL DATA:  Follow-up exam.  Atelectasis. EXAM: PORTABLE CHEST 1 VIEW COMPARISON:  07/20/2018 and multiple earlier exams. FINDINGS: Bilateral interstitial thickening is stable. Lungs are hyperexpanded. Left lung base consolidation has mildly increased from the previous day's exam, now obscuring the entire left hemidiaphragm and more of the left heart border. Endotracheal tube and nasal/orogastric tube are stable and well positioned. IMPRESSION: 1. Mild interval increase in the left lung base consolidation when compared to the most recent prior exam. 2. No other change.  Stable well-positioned support apparatus. Electronically Signed   By: Lajean Manes M.D.   On: 07/21/2018 05:36   Dg Chest Port 1 View  Result Date: 07/20/2018 CLINICAL DATA:  S/p bronchoscopy with bronchoalveolar lavage. On ventilator. EXAM: PORTABLE CHEST 1 VIEW COMPARISON:  07/20/2018 FINDINGS: Endotracheal tube and orogastric tube remain in place. Significant improvement of left lung collapse is seen since previous study. There is persistent atelectasis or consolidation in the left lower lobe. Small pleural effusions are seen bilaterally. No evidence of pneumothorax. IMPRESSION: Significant improvement in left lung collapse compared to prior. Persistent left lower lobe atelectasis versus  consolidation, and small bilateral pleural effusions. Electronically Signed   By: Marlaine Hind M.D.   On: 07/20/2018 17:51   Dg Chest Port 1 View  Result Date: 07/20/2018 CLINICAL DATA:  Encounter for intubation EXAM: PORTABLE CHEST 1 VIEW COMPARISON:  July 20, 2018 FINDINGS: The ETT is in good position. The NG tube terminates below today's film. The left lung is completely opacified. There is a small right pleural effusion. The remainder of the right lung is clear. The cardiomediastinal silhouette is not well assessed due to left lung opacification. IMPRESSION: 1. The ETT is in good position. 2. Complete opacification of the left hemithorax. 3. Small right pleural effusion. Electronically Signed   By: Dorise Bullion III M.D   On: 07/20/2018 16:49   Dg Chest Port 1 View  Result Date: 07/20/2018 CLINICAL DATA:  Respiratory distress. EXAM: PORTABLE CHEST 1 VIEW COMPARISON:  07/18/2018 FINDINGS: Internal extubation. The cardiac and mediastinal silhouettes are obscured by complete white out of the left lung. Slight shift of the mediastinal structures to the left. Small right pleural effusion. Possible mild pulmonary edema of the right lung. IMPRESSION: 1. Complete opacification of the left lung with mild leftward mediastinal shift. This may be due to complete collapse of the left lung and/or layering left pleural effusion/left hemothorax. Please correlate clinically. 2. Small right pleural effusion. 3. Possible mild pulmonary edema of the right lung. These results will be called to the ordering clinician or representative by the Radiologist Assistant, and communication documented in the PACS or zVision Dashboard. Electronically Signed   By: Fidela Salisbury M.D.   On: 07/20/2018 11:41   Dg Chest Port 1 View  Result Date: 07/17/2018 CLINICAL DATA:  Post intubation. EXAM: PORTABLE CHEST 1 VIEW COMPARISON:  July 04, 2018 FINDINGS: The endotracheal tube terminates above the carina by approximately 4.8 cm. The  enteric tube appears to extend below the left hemidiaphragm. The heart size is enlarged. There is a dense retrocardiac opacity. There are worsening interstitial lung markings bilaterally. There is no pneumothorax. IMPRESSION: 1. Lines and tubes as above. 2. Interval worsening of a dense retrocardiac opacity as seen on prior studies. 3. Worsening interstitial lung markings bilaterally. 4. Stable cardiac enlargement. Electronically Signed   By:  Constance Holster M.D.   On: 07/17/2018 00:08   Dg Swallowing Func-speech Pathology  Result Date: 07/31/2018 Objective Swallowing Evaluation: Type of Study: MBS-Modified Barium Swallow Study  Patient Details Name: [name] MRN: [id number] Date of Birth: 02-06-1950 Today's Date: 07/31/2018 Time: SLP Start Time (ACUTE ONLY): 2993 -SLP Stop Time (ACUTE ONLY): 1408 SLP Time Calculation (min) (ACUTE ONLY): 13 min Past Medical History: Past Medical History: Diagnosis Date . Acute MI ([place])   x4, code blue x3 . CAD (coronary artery disease) 2002  Inf STEMI-2002. 2003-cutting balloon + brachytherapy for restenosis; subsequent acute stent thrombosis 06/2010 requiring 2 separate interventions (Zeta stent, then repeat cath with thrombectomy). focal basal inf AK, nl EF; 03/2011: Patent stents, minor nonobst  residual dz, nl EF; neg stress nuclear in 2008 and stress echo in 2009 . Chronic anticoagulation   Warfarin plus ticagrelor . Chronic respiratory failure ([place]) 08/27/2013  On 2L 02 . COPD (chronic obstructive pulmonary disease) ([place])   02 dependent . Factor 5 Leiden mutation, heterozygous ([place])  . Factor V Leiden, prothrombin gene mutation ([place]) 2006 . Hyperlipidemia  . Hypothyroidism  . Noncompliance  . Pelvic fracture ([place]) 2009 . Pulmonary embolism ([place]) 2006  Associated with deep vein thrombosis-2006; + factor V Leiden . Tobacco abuse   50 pack years Past Surgical History: Past Surgical History: Procedure Laterality Date . COLONOSCOPY  Approximately 2000  Negative screening  study . COLONOSCOPY WITH PROPOFOL N/A 05/28/2018  Procedure: COLONOSCOPY WITH PROPOFOL;  Surgeon: Rogene Houston, MD;  [facility];  Service: Endoscopy;  Laterality: N/A; . CORONARY ANGIOPLASTY  2002, 2003, 2012 . ESOPHAGOGASTRODUODENOSCOPY (EGD) WITH PROPOFOL N/A 05/26/2018  Procedure: ESOPHAGOGASTRODUODENOSCOPY (EGD) WITH PROPOFOL;  Surgeon: Rogene Houston, MD;  [facility];  Service: Endoscopy;  Laterality: N/A; . LEFT AND RIGHT HEART CATHETERIZATION WITH CORONARY ANGIOGRAM N/A 04/03/2011  Procedure: LEFT AND RIGHT HEART CATHETERIZATION WITH CORONARY ANGIOGRAM;  Surgeon: Sherren Mocha, MD;  Location: [facility] CATH LAB;  Service: Cardiovascular;  Laterality: N/A; . POLYPECTOMY  05/28/2018  Procedure: POLYPECTOMY;  Surgeon: Rogene Houston, MD;  [facility];  Service: Endoscopy;;  cold snare and biopsy forcep HPI: On 7/16 pt was intubated for a third time with extubation on 7/21.  Mucous plugging removed with bronchoscopy. Pt has had CPR twice during this admission. RN reports improving cough and vocal quality from 7/21 PM to 7/22 AM.  Subjective: Pt awake, alert, pleasant, participative. Assessment / Plan / Recommendation CHL IP CLINICAL IMPRESSIONS 07/31/2018 Clinical Impression Pt presents with mild oropharyngeal dysphagia c/b delayed swallow initiation, decreased hyolaryngeal elevation and excursion, reduced laryngeal closure, and diminished sensation.  These deficits resulted in trace, silent penetration of thin liquid above the vocal folds.  Cued cough was beneficial to clear penetration, but was extremely uncomfortable for pt given hx of chest compressions x2.  Chin tuck did not prevent penetration.  Cup presentation did not prevent penetration.  Alternating liquid with heavier bolus did not clear penetration.  There was no penetration of puree, regular solid texture, or nectar thick liquid by cup or straw.  During pill simulation, there was brief vallecular stasis of tablet  which cleared with spontaneous subsequent swallow.  Recommend regular texture diet with nectar thick liquids. SLP Visit Diagnosis Dysphagia, oropharyngeal phase (R13.12) Attention and concentration deficit following -- Frontal lobe and executive function deficit following -- Impact on safety and function --   CHL IP TREATMENT RECOMMENDATION 07/31/2018 Treatment Recommendations Therapy as outlined in treatment plan below  Prognosis 07/31/2018 Prognosis for Safe Diet Advancement Good Barriers to Reach Goals -- Barriers/Prognosis Comment -- CHL IP DIET RECOMMENDATION 07/31/2018 SLP Diet Recommendations Regular solids;Nectar thick liquid Liquid Administration via Cup;Straw Medication Administration Whole meds with liquid Compensations Slow rate;Small sips/bites Postural Changes Remain semi-upright after after feeds/meals (Comment)   CHL IP OTHER RECOMMENDATIONS 03/02/2017 Recommended Consults -- Oral Care Recommendations Oral care BID;Staff/trained caregiver to provide oral care Other Recommendations Clarify dietary restrictions   CHL IP FOLLOW UP RECOMMENDATIONS 07/25/2018 Follow up Recommendations None   CHL IP FREQUENCY AND DURATION 07/31/2018 Speech Therapy Frequency (ACUTE ONLY) min 2x/week Treatment Duration 1 week      CHL IP ORAL PHASE 07/31/2018 Oral Phase WFL Oral - Pudding Teaspoon -- Oral - Pudding Cup -- Oral - Honey Teaspoon -- Oral - Honey Cup -- Oral - Nectar Teaspoon -- Oral - Nectar Cup WFL Oral - Nectar Straw WFL Oral - Thin Teaspoon -- Oral - Thin Cup WFL Oral - Thin Straw WFL Oral - Puree WFL Oral - Mech Soft -- Oral - Regular WFL Oral - Multi-Consistency -- Oral - Pill WFL Oral Phase - Comment --  CHL IP PHARYNGEAL PHASE 07/31/2018 Pharyngeal Phase Impaired Pharyngeal- Pudding Teaspoon -- Pharyngeal -- Pharyngeal- Pudding Cup -- Pharyngeal -- Pharyngeal- Honey Teaspoon -- Pharyngeal -- Pharyngeal- Honey Cup -- Pharyngeal -- Pharyngeal- Nectar Teaspoon -- Pharyngeal -- Pharyngeal- Nectar Cup [facility] Pharyngeal  Material does not enter airway Pharyngeal- Nectar Straw WFL Pharyngeal Material does not enter airway Pharyngeal- Thin Teaspoon -- Pharyngeal -- Pharyngeal- Thin Cup Reduced airway/laryngeal closure;Delayed swallow initiation-vallecula Pharyngeal Material enters airway, remains ABOVE vocal cords and not ejected out Pharyngeal- Thin Straw Reduced airway/laryngeal closure;Delayed swallow initiation-vallecula Pharyngeal Material enters airway, remains ABOVE vocal cords and not ejected out Pharyngeal- Puree Delayed swallow initiation-vallecula Pharyngeal Material does not enter airway Pharyngeal- Mechanical Soft -- Pharyngeal -- Pharyngeal- Regular Delayed swallow initiation-vallecula Pharyngeal Material does not enter airway Pharyngeal- Multi-consistency -- Pharyngeal -- Pharyngeal- Pill Pharyngeal residue - valleculae Pharyngeal Material does not enter airway Pharyngeal Comment --  CHL IP CERVICAL ESOPHAGEAL PHASE 07/31/2018 Cervical Esophageal Phase WFL Pudding Teaspoon -- Pudding Cup -- Honey Teaspoon -- Honey Cup -- Nectar Teaspoon -- Nectar Cup -- Nectar Straw -- Thin Teaspoon -- Thin Cup -- Thin Straw -- Puree -- Mechanical Soft -- Regular -- Multi-consistency -- Pill -- Cervical Esophageal Comment -- Celedonio Savage, MA, CCC-SLP Acute Rehabilitation Services Office: [phone] 07/31/2018, 3:10 PM              [place] Ekg Site Rite  Result Date: 07/21/2018 If Site Rite image not attached, placement could not be confirmed due to current cardiac rhythm.  US Thoracentesis Asp Pleural Space W/img Guide  Result Date: 07/20/2018 INDICATION: Shortness of breath. Complete opacification of the left lung possibly secondary to underlying effusion. Request for image guided diagnostic and therapeutic thoracentesis. EXAM: ULTRASOUND GUIDED LEFT THORACENTESIS MEDICATIONS: None. COMPLICATIONS: None immediate. PROCEDURE: An ultrasound guided thoracentesis was thoroughly discussed with the patient and questions answered. The  benefits, risks, alternatives and complications were also discussed. The patient understands and wishes to proceed with the procedure. Written consent was obtained. Ultrasound of the left chest finds predominantly consolidated lung fields essentially throughout, except superiorly, where small effusion was approachable for thoracentesis. Ultrasound was performed to localize and mark an adequate pocket of fluid in the left chest. The area was then prepped and draped in the normal sterile fashion. 1% Lidocaine was used for local anesthesia. Under ultrasound guidance a 6  Fr Safe-T-Centesis catheter was introduced. Thoracentesis was performed. The catheter was removed and a dressing applied. FINDINGS: A total of approximately only 320 mL of slightly hazy, yellow fluid was removed. Samples were sent to the laboratory as requested by the clinical team. IMPRESSION: Successful ultrasound guided left thoracentesis yielding 320 mL of pleural fluid. Given complete opacification on chest x-ray and minimal effusion noted on ultrasound, findings could suggest mucous plugging. Read by: Ascencion Dike PA-C Electronically Signed   By: Sandi Mariscal M.D.   On: 07/20/2018 14:18     Procedures:  Intubated 07/16/2018 and extubated 07/18/2018  Left-sided thoracentesis on 07/20/2018  Bronchoscopy on 07/20/2018  Reintubated on 07/20/2018 and extubated on 07/22/2018  Cardiac arrest on 07/25/2018.  Repeat bronchoscopy on 07/26/2018   Transthoracic echocardiogram 07/26/2018: IMPRESSIONS    1. Severe hypokinesis of the left ventricular, basal-mid inferior wall, inferolateral wall and anterolateral wall.  2. The left ventricle has moderately reduced systolic function, with an ejection fraction of 35-40%. The cavity size was normal. Left ventricular diastolic function could not be evaluated.  3. Left atrial size was mildly dilated.  4. There is moderate mitral annular calcification present. Mitral valve regurgitation is moderate by  color flow Doppler. The MR jet is centrally-directed.  5. The aortic root is normal in size and structure.  6. When compared to the prior study: 07/05/2018, there is an extensive new wall motion abnormality involving the left circumflex artery and possibly also the right coronary artery distribution; there is a merked reduction in left ventricular function  and worsened mitral insufficiency.     Diastolic function was not assessed.   Subjective: Patient seen and examined at bedside, resting comfortably.  No complaints this morning.  Ready for discharge to SNF today.  Denies headache, no fever/chills/night sweats, no nausea some vomiting/diarrhea, no chest pain, no palpitations, no shortness of breath, no abdominal pain.  No acute events overnight per nurse staff.   Discharge Exam: Vitals:   08/11/18 2052 08/12/18 0607  BP: 137/60 (!) 126/56  Pulse: (!) 104 73  Resp: 19 (!) 21  Temp: 98.5 F (36.9 C) 97.6 F (36.4 C)  SpO2: 99% 100%   Vitals:   08/11/18 2045 08/11/18 2052 08/12/18 0500 08/12/18 0607  BP: 137/60 137/60  (!) 126/56  Pulse: 83 (!) 104  73  Resp: (!) 22 19  (!) 21  Temp:  98.5 F (36.9 C)  97.6 F (36.4 C)  TempSrc:  Oral  Oral  SpO2: 99% 99%  100%  Weight:   68.7 kg   Height:        General: Pt is alert, awake, not in acute distress Cardiovascular: RRR, S1/S2 +, no rubs, no gallops Respiratory: CTA bilaterally, no wheezing, no rhonchi Abdominal: Soft, NT, ND, bowel sounds + Extremities: Trace lower extremity edema, no cyanosis    The results of significant diagnostics from this hospitalization (including imaging, microbiology, ancillary and laboratory) are listed below for reference.     Microbiology: Recent Results (from the past 240 hour(s))  Novel Coronavirus, NAA (hospital order; send-out to ref lab)     Status: None   Collection Time: 08/10/18  1:04 PM   Specimen: Nasopharyngeal Swab; Respiratory  Result Value Ref Range Status   SARS-CoV-2, NAA  NOT DETECTED NOT DETECTED Final    Comment: (NOTE) This test was developed and its performance characteristics determined by [employer]. This test has not been FDA cleared or approved. This test has been authorized by FDA under an Emergency  Use Authorization (EUA). This test is only authorized for the duration of time the declaration that circumstances exist justifying the authorization of the emergency use of in vitro diagnostic tests for detection of SARS-CoV-2 virus and/or diagnosis of COVID-19 infection under section 564(b)(1) of the Act, 21 U.S.C. 315QMG-8(Q)(7), unless the authorization is terminated or revoked sooner. When diagnostic testing is negative, the possibility of a false negative result should be considered in the context of a patient's recent exposures and the presence of clinical signs and symptoms consistent with COVID-19. An individual without symptoms of COVID-19 and who is not shedding SARS-CoV-2 virus would expect to have a negative (not detected) result in this assay. Performed  At: [facility] [address] [place], [place] [id number] Rush Farmer MD ZT:[id number]    [place]  Final    Comment: Performed at [facility], [place] [address], [place], [place] [address]     Labs: BNP (last 3 results) Recent Labs    05/23/18 1027 07/04/18 2020 07/20/18 1059  BNP 80.0 117.0* 397.6*   Basic Metabolic Panel: Recent Labs  Lab 08/09/18 0500 08/12/18 0500  NA 136 132*  K 5.4* 4.7  CL 98 92*  CO2 31 31  GLUCOSE 91 89  BUN 17 24*  CREATININE 0.88 1.21*  CALCIUM 8.7* 8.8*  MG 1.8  --   PHOS 4.5  --    Liver Function Tests: No results for input(s): AST, ALT, ALKPHOS, BILITOT, PROT, ALBUMIN in the last 168 hours. No results for input(s): LIPASE, AMYLASE in the last 168 hours. No results for input(s): AMMONIA in the last 168 hours. CBC: Recent Labs  Lab 08/08/18 0423 08/09/18 0500 08/10/18 0513  08/11/18 0543 08/12/18 0500  WBC 5.2 5.2 4.6 3.7* 4.8  HGB 8.5* 8.6* 9.0* 8.5* 9.0*  HCT 27.7* 28.2* 29.9* 28.2* 29.5*  MCV 99.6 102.2* 101.7* 102.2* 101.4*  PLT 207 220 228 211 218   Cardiac Enzymes: No results for input(s): CKTOTAL, CKMB, CKMBINDEX, TROPONINI in the last 168 hours. BNP: Invalid input(s): POCBNP CBG: No results for input(s): GLUCAP in the last 168 hours. D-Dimer No results for input(s): DDIMER in the last 72 hours. Hgb A1c No results for input(s): HGBA1C in the last 72 hours. Lipid Profile No results for input(s): CHOL, HDL, LDLCALC, TRIG, CHOLHDL, LDLDIRECT in the last 72 hours. Thyroid function studies No results for input(s): TSH, T4TOTAL, T3FREE, THYROIDAB in the last 72 hours.  Invalid input(s): FREET3 Anemia work up No results for input(s): VITAMINB12, FOLATE, FERRITIN, TIBC, IRON, RETICCTPCT in the last 72 hours. Urinalysis    Component Value Date/Time   COLORURINE YELLOW 05/23/2018 0934   APPEARANCEUR CLEAR 05/23/2018 0934   LABSPEC 1.014 05/23/2018 0934   PHURINE 7.0 05/23/2018 0934   GLUCOSEU NEGATIVE 05/23/2018 0934   HGBUR SMALL (A) 05/23/2018 0934   BILIRUBINUR NEGATIVE 05/23/2018 0934   BILIRUBINUR n 09/21/2016 1432   KETONESUR NEGATIVE 05/23/2018 0934   PROTEINUR 30 (A) 05/23/2018 0934   UROBILINOGEN 0.2 09/21/2016 1432   UROBILINOGEN 0.2 04/13/2009 2025   NITRITE NEGATIVE 05/23/2018 0934   LEUKOCYTESUR NEGATIVE 05/23/2018 0934   Sepsis Labs Invalid input(s): PROCALCITONIN,  WBC,  LACTICIDVEN Microbiology Recent Results (from the past 240 hour(s))  Novel Coronavirus, NAA (hospital order; send-out to ref lab)     Status: None   Collection Time: 08/10/18  1:04 PM   Specimen: Nasopharyngeal Swab; Respiratory  Result Value Ref Range Status   SARS-CoV-2, NAA NOT DETECTED NOT DETECTED Final  Comment: (NOTE) This test was developed and its performance characteristics determined by [employer]. This test has not been  FDA cleared or approved. This test has been authorized by FDA under an Emergency Use Authorization (EUA). This test is only authorized for the duration of time the declaration that circumstances exist justifying the authorization of the emergency use of in vitro diagnostic tests for detection of SARS-CoV-2 virus and/or diagnosis of COVID-19 infection under section 564(b)(1) of the Act, 21 U.S.C. 962XBM-8(U)(1), unless the authorization is terminated or revoked sooner. When diagnostic testing is negative, the possibility of a false negative result should be considered in the context of a patient's recent exposures and the presence of clinical signs and symptoms consistent with COVID-19. An individual without symptoms of COVID-19 and who is not shedding SARS-CoV-2 virus would expect to have a negative (not detected) result in this assay. Performed  At: [facility] [address] [place], [place] [id number] Rush Farmer MD OZ:[id number]    [place]  Final    Comment: Performed at [facility], [place] [address] [place], [place] [address]     Time coordinating discharge: Over 30 minutes  SIGNED:   [place] Kadlec J [place] DO  [facility] 08/12/2018, 10:53 AM

## 2018-09-13 ENCOUNTER — Telehealth (INDEPENDENT_AMBULATORY_CARE_PROVIDER_SITE_OTHER): Payer: Medicare Other | Admitting: Family Medicine

## 2018-09-13 ENCOUNTER — Other Ambulatory Visit: Payer: Self-pay

## 2018-09-13 DIAGNOSIS — J439 Emphysema, unspecified: Secondary | ICD-10-CM | POA: Diagnosis not present

## 2018-09-13 DIAGNOSIS — R0602 Shortness of breath: Secondary | ICD-10-CM | POA: Diagnosis not present

## 2018-09-13 DIAGNOSIS — R8290 Unspecified abnormal findings in urine: Secondary | ICD-10-CM

## 2018-09-13 NOTE — Progress Notes (Signed)
Virtual Visit via Telephone Note  I connected with [name] on 09/13/18 at  9:00 AM EDT by telephone and verified that I am speaking with the correct person using two identifiers.   I discussed the limitations, risks, security and privacy concerns of performing an evaluation and management service by telephone and the availability of in person appointments. I also discussed with the patient that there may be a patient responsible charge related to this service. The patient expressed understanding and agreed to proceed.  Location patient: home Location provider: work or home office Participants present for the call: patient, provider Patient did not have a visit in the prior 7 days to address this/these issue(s).   History of Present Illness: Pt is a 68 yo female with pmh sig for h/o PE, CAD, rib fx s/p CPR, COPD on O2, MI, hypothyroidism, factor V leiden def, tobacco use, HLD, chronic back pain.  Pt states she was released from the NH on Wednesday.  Pt feels like the NH using powder on her legs caused a UTI. Pt endorses strong odor to urine, nausea, abdominal pain, flatus, chills, subjective fever.  Pt with urinary dribbling when sitting on the toilet but puts a diaper on and the "urine gushes" when sitting in a chair.  Pt considering taking 3 expired Cipro pills.  Pt states she called EMS last night 2/2 SOB.  Pt wanted a breathing treatment but they wanted her to go to the ED.  Pt endorses feeling SOB again.  Pt is on 3L O2 at baseline.  Pt unsure of O2 sats.  O2 supplied by [facility].  Pt thinks while she was in the NH someone came and got all her O2 tanks.  Pt is almost out of O2.  Pt endorses transportation issues as her daughter is in [place] and her granddaughter is leaving for school in [place].   Observations/Objective: Patient sounds cheerful and well on the phone. I do not appreciate any SOB. Speech and thought processing are grossly intact. Patient reported  vitals:  Assessment and Plan: Abnormal urine odor -discussed symptoms possibly 2/2 UTI with potential for urosepsis given chills and subjective fever -Discussed need for UA, UCx  SOB (shortness of breath) -possible COPD exacerbation, CAP, etc  Pulmonary emphysema, unspecified emphysema type ([place]) -continue O2 -contact [facility] regarding O2 supply   Given the above and recent d/c from SNF pt advised to go to the nearest UC or ED for further evaluation.  Pt declines 2/2 transportation concerns.  Pt again strongly advised on the need for in person evaluation.   Follow Up Instructions: Pt advised to proceed to nearest ED for further eval.   I did not refer this patient for an OV in the next 24 hours for this/these issue(s).  I discussed the assessment and treatment plan with the patient. The patient was provided an opportunity to ask questions and all were answered. The patient agreed with the plan and demonstrated an understanding of the instructions.   The patient was advised to call back or seek an in-person evaluation if the symptoms worsen or if the condition fails to improve as anticipated.  I provided 25 minutes of non-face-to-face time during this encounter.   Billie Ruddy, MD

## 2018-09-17 ENCOUNTER — Ambulatory Visit: Payer: Medicare Other | Admitting: Adult Health

## 2018-09-18 ENCOUNTER — Telehealth: Payer: Self-pay | Admitting: Adult Health

## 2018-09-19 ENCOUNTER — Ambulatory Visit: Payer: Medicare Other | Admitting: Adult Health

## 2018-10-11 ENCOUNTER — Encounter: Payer: Self-pay | Admitting: Primary Care

## 2018-10-11 ENCOUNTER — Ambulatory Visit (INDEPENDENT_AMBULATORY_CARE_PROVIDER_SITE_OTHER): Payer: Medicare Other | Admitting: Primary Care

## 2018-10-11 ENCOUNTER — Other Ambulatory Visit: Payer: Self-pay

## 2018-10-11 DIAGNOSIS — J449 Chronic obstructive pulmonary disease, unspecified: Secondary | ICD-10-CM | POA: Diagnosis not present

## 2018-10-11 DIAGNOSIS — J9611 Chronic respiratory failure with hypoxia: Secondary | ICD-10-CM

## 2018-10-11 MED ORDER — ARFORMOTEROL TARTRATE 15 MCG/2ML IN NEBU: 15.0000 ug | INHALATION_SOLUTION | Freq: Two times a day (BID) | RESPIRATORY_TRACT | 6 refills | Status: AC

## 2018-10-11 NOTE — Patient Instructions (Signed)
Prescription sent for Inogen Continue 3-4 L oxygen to keep O2 >88-90%  Follow-up 1-3 months with new pulmonary provider (previous [facility] patient)

## 2018-10-11 NOTE — Progress Notes (Signed)
Virtual Visit via Telephone Note  I connected with [name] on 10/11/18 at  2:30 PM EDT by telephone and verified that I am speaking with the correct person using two identifiers.  Location: Patient: Home Provider: Office    I discussed the limitations, risks, security and privacy concerns of performing an evaluation and management service by telephone and the availability of in person appointments. I also discussed with the patient that there may be a patient responsible charge related to this service. The patient expressed understanding and agreed to proceed.  Synopsis: Former patient of Dr. Ashok Cordia with severe COPD, pulmonary embolism, factor V Leidin and CAD.  She has been smoking since age 89, was still smoking 1/2-1 ppd in 08/2017.  She has had multiple blood clots in her lungs since the 1990's.  She has been on blood thinners (warfarin) since 2006.    History of Present Illness: 68 year old female, former smoker quit in July 2020. PMH significant for severe COPD with emphysema, chronic respiratory failure (oxygen dependent), pulmonary embolism (on coumadin), CAD s/p percutaneous coronary angioplasty, cerebrovascular disease, factor V deficiency, hyperlipidemia. Patient of Dr. Lake Bells, last seen on 10/04/17. Maintained on Pulmicort and Brovana twice daily.   10/11/2018 Patient contacted today for follow-up telephone visit. She is interested in getting [employer] portable oxygen concentrator to help with her mobility. Reports O2 saturation 90-92%. She is using 3-4 L oxygen. States that she has difficulty moving around d/t large oxygen tanks, she has trouble carrying them. She was hospitalized from 07/16/18-08/12/18 for GI hemorrhage, PEA, acute on chronic respiratory failure requiring intubation. She reports doing a lot better since discharge from SNF. States that she she is able to walk to the back of the house to use the bathroom, before she was needing to use her bedtime commode. No new  respiratory symptoms. Using flutter daily. Needs refill Brovana. Denies active wheezing or cough.   Observations/Objective:  - No significant shortness of breath, wheezing or cough noted during phone conversatoin  Testing reviewed: 6MWT 11/26/14: Walked 48 meters / Baseline Sat 90% on RA / Nadir Sat 88% on RA (heart rate was irregular &pt ambulated for <7min due to dyspnea)  CARDIAC February 2019 echocardiogram grade 2 diastolic dysfunction with normal LVEF, no comment on pulmonary artery pressures. TTE (04/29/13):Mild LVH. EF 50-55 %. grade 1 diastolic dysfunction. Akinesis of basal-mid inferior myocardium. LA &RA normal in size. RV normal in size and function. No aortic stenosis or regurgitation. Trivial mitral regurgitation. Trivial tricuspid regurgitation. IVC normal in size. Small pericardial effusion  Assessment and Plan:  Severe COPD - Stable interval - Continues Brovana and Pulmicort twice daily (refill sent)  Chronic respiratory failure with hypoxia - RX Inogen portable oxygen concentrator to improve mobility and access  - Continue 3-4L continuous to keep O2 <88-90%  Follow Up Instructions:  - Follow up in 1-3 months with new pulmonary provider (previous [facility] patient)   I discussed the assessment and treatment plan with the patient. The patient was provided an opportunity to ask questions and all were answered. The patient agreed with the plan and demonstrated an understanding of the instructions.   The patient was advised to call back or seek an in-person evaluation if the symptoms worsen or if the condition fails to improve as anticipated.  I provided 15 minutes of non-face-to-face time during this encounter.   Martyn Ehrich, NP

## 2018-10-14 ENCOUNTER — Emergency Department (HOSPITAL_COMMUNITY)
Admission: EM | Admit: 2018-10-14 | Discharge: 2018-10-16 | Disposition: A | Discharge status: Home / Self Care | Payer: Medicare Other | Attending: Emergency Medicine | Admitting: Emergency Medicine

## 2018-10-14 ENCOUNTER — Emergency Department (HOSPITAL_COMMUNITY): Payer: Medicare Other

## 2018-10-14 ENCOUNTER — Encounter (HOSPITAL_COMMUNITY): Payer: Self-pay

## 2018-10-14 DIAGNOSIS — Z79899 Other long term (current) drug therapy: Secondary | ICD-10-CM | POA: Insufficient documentation

## 2018-10-14 DIAGNOSIS — R0602 Shortness of breath: Secondary | ICD-10-CM | POA: Diagnosis present

## 2018-10-14 DIAGNOSIS — E039 Hypothyroidism, unspecified: Secondary | ICD-10-CM | POA: Insufficient documentation

## 2018-10-14 DIAGNOSIS — J449 Chronic obstructive pulmonary disease, unspecified: Secondary | ICD-10-CM | POA: Insufficient documentation

## 2018-10-14 DIAGNOSIS — Z20828 Contact with and (suspected) exposure to other viral communicable diseases: Secondary | ICD-10-CM | POA: Insufficient documentation

## 2018-10-14 DIAGNOSIS — I252 Old myocardial infarction: Secondary | ICD-10-CM | POA: Insufficient documentation

## 2018-10-14 DIAGNOSIS — Z7901 Long term (current) use of anticoagulants: Secondary | ICD-10-CM | POA: Insufficient documentation

## 2018-10-14 DIAGNOSIS — R059 Cough, unspecified: Secondary | ICD-10-CM

## 2018-10-14 DIAGNOSIS — R05 Cough: Secondary | ICD-10-CM | POA: Insufficient documentation

## 2018-10-14 DIAGNOSIS — Z87891 Personal history of nicotine dependence: Secondary | ICD-10-CM | POA: Diagnosis not present

## 2018-10-14 DIAGNOSIS — I2510 Atherosclerotic heart disease of native coronary artery without angina pectoris: Secondary | ICD-10-CM | POA: Diagnosis not present

## 2018-10-14 DIAGNOSIS — R404 Transient alteration of awareness: Secondary | ICD-10-CM | POA: Diagnosis not present

## 2018-10-14 LAB — COMPREHENSIVE METABOLIC PANEL
ALT: 12 U/L (ref 0–44)
AST: 17 U/L (ref 15–41)
Albumin: 3.5 g/dL (ref 3.5–5.0)
Alkaline Phosphatase: 119 U/L (ref 38–126)
Anion gap: 11 (ref 5–15)
BUN: 32 mg/dL — ABNORMAL HIGH (ref 8–23)
CO2: 36 mmol/L — ABNORMAL HIGH (ref 22–32)
Calcium: 9 mg/dL (ref 8.9–10.3)
Chloride: 97 mmol/L — ABNORMAL LOW (ref 98–111)
Creatinine, Ser: 0.72 mg/dL (ref 0.44–1.00)
GFR calc Af Amer: >60 mL/min (ref >60)
GFR calc non Af Amer: >60 mL/min (ref >60)
Glucose, Bld: 98 mg/dL (ref 70–99)
Potassium: 4.3 mmol/L (ref 3.5–5.1)
Sodium: 144 mmol/L (ref 135–145)
Total Bilirubin: 0.5 mg/dL (ref 0.3–1.2)
Total Protein: 7.3 g/dL (ref 6.5–8.1)

## 2018-10-14 LAB — CBC WITH DIFFERENTIAL/PLATELET
Abs Immature Granulocytes: 0.02 10*3/uL (ref 0.00–0.07)
Basophils Absolute: 0 10*3/uL (ref 0.0–0.1)
Basophils Relative: 0 %
Eosinophils Absolute: 0.1 10*3/uL (ref 0.0–0.5)
Eosinophils Relative: 2 %
HCT: 41.2 % (ref 36.0–46.0)
Hemoglobin: 11.8 g/dL — ABNORMAL LOW (ref 12.0–15.0)
Immature Granulocytes: 0 %
Lymphocytes Relative: 13 %
Lymphs Abs: 0.8 10*3/uL (ref 0.7–4.0)
MCH: 30.2 pg (ref 26.0–34.0)
MCHC: 28.6 g/dL — ABNORMAL LOW (ref 30.0–36.0)
MCV: 105.4 fL — ABNORMAL HIGH (ref 80.0–100.0)
Monocytes Absolute: 0.6 10*3/uL (ref 0.1–1.0)
Monocytes Relative: 9 %
Neutro Abs: 4.5 10*3/uL (ref 1.7–7.7)
Neutrophils Relative %: 76 %
Platelets: 228 10*3/uL (ref 150–400)
RBC: 3.91 MIL/uL (ref 3.87–5.11)
RDW: 14 % (ref 11.5–15.5)
WBC: 6 10*3/uL (ref 4.0–10.5)
nRBC: 0 % (ref 0.0–0.2)

## 2018-10-14 LAB — LACTIC ACID, PLASMA
Lactic Acid, Venous: 0.8 mmol/L (ref 0.5–1.9)
Lactic Acid, Venous: 0.8 mmol/L (ref 0.5–1.9)

## 2018-10-14 LAB — BRAIN NATRIURETIC PEPTIDE: B Natriuretic Peptide: 39 pg/mL (ref 0.0–100.0)

## 2018-10-14 LAB — SARS CORONAVIRUS 2 BY RT PCR (HOSPITAL ORDER, PERFORMED IN ~~LOC~~ HOSPITAL LAB): SARS Coronavirus 2: NEGATIVE

## 2018-10-14 LAB — AMMONIA: Ammonia: 36 umol/L — ABNORMAL HIGH (ref 9–35)

## 2018-10-14 LAB — D-DIMER, QUANTITATIVE: D-Dimer, Quant: 1.35 ug/mL-FEU — ABNORMAL HIGH (ref 0.00–0.50)

## 2018-10-14 MED ORDER — IOHEXOL 350 MG/ML SOLN
100.0000 mL | Freq: Once | INTRAVENOUS | Status: AC | PRN
  Administered 2018-10-14: 19:00:00 100 mL via INTRAVENOUS

## 2018-10-14 NOTE — ED Provider Notes (Addendum)
16:30: Assumed care of patient from [facility] PA-C at change of shift pending labs, imaging, re-assessment, & disposition.   Please see prior provider note for full H&P.  Briefly patient is a 68 year old female with chronic respiratory failure on home oxygen, prior VTE on coumadin, CAD, hyperlipidemia, and COPD who presented to the emergency department for cough and dyspnea.  Initially fairly uncooperative with questioning and exam.    Physical Exam  BP (!) 148/66   Pulse 98   Temp 98.5 F (36.9 C) (Oral)   Resp (!) 23   SpO2 100%   Physical Exam Vitals signs and nursing note reviewed.  Constitutional:      Appearance: She is well-developed.  HENT:     Head: Normocephalic and atraumatic.  Eyes:     General:        Right eye: No discharge.        Left eye: No discharge.     Conjunctiva/sclera: Conjunctivae normal.  Neck:     Musculoskeletal: Neck supple.  Cardiovascular:     Rate and Rhythm: Normal rate and regular rhythm.  Pulmonary:     Effort: Pulmonary effort is normal.     Breath sounds: Rhonchi (scattered) present.  Neurological:     Mental Status: She is alert.     Comments: Clear speech.  CN III through XII grossly intact.  Sensation grossly intact bilateral upper and lower extremities.  5 out of 5 symmetric grip strength.  5 out of 5 strength with plantar dorsiflexion bilaterally.  Patient is ambulatory with assistance.  Psychiatric:        Behavior: Behavior normal.        Thought Content: Thought content normal.     ED Course/Procedures     Results for orders placed or performed during the hospital encounter of 10/14/18  SARS Coronavirus 2 [facility] order, Performed in [facility] hospital lab) Nasopharyngeal Nasopharyngeal Swab   Specimen: Nasopharyngeal Swab  Result Value Ref Range   SARS Coronavirus 2 NEGATIVE NEGATIVE  Comprehensive metabolic panel  Result Value Ref Range   Sodium 144 135 - 145 mmol/L   Potassium 4.3 3.5 - 5.1 mmol/L   Chloride 97  (L) 98 - 111 mmol/L   CO2 36 (H) 22 - 32 mmol/L   Glucose, Bld 98 70 - 99 mg/dL   BUN 32 (H) 8 - 23 mg/dL   Creatinine, Ser 0.72 0.44 - 1.00 mg/dL   Calcium 9.0 8.9 - 10.3 mg/dL   Total Protein 7.3 6.5 - 8.1 g/dL   Albumin 3.5 3.5 - 5.0 g/dL   AST 17 15 - 41 U/L   ALT 12 0 - 44 U/L   Alkaline Phosphatase 119 38 - 126 U/L   Total Bilirubin 0.5 0.3 - 1.2 mg/dL   GFR calc non Af Amer >60 >60 mL/min   GFR calc Af Amer >60 >60 mL/min   Anion gap 11 5 - 15  CBC with Differential  Result Value Ref Range   WBC 6.0 4.0 - 10.5 K/uL   RBC 3.91 3.87 - 5.11 MIL/uL   Hemoglobin 11.8 (L) 12.0 - 15.0 g/dL   HCT 41.2 36.0 - 46.0 %   MCV 105.4 (H) 80.0 - 100.0 fL   MCH 30.2 26.0 - 34.0 pg   MCHC 28.6 (L) 30.0 - 36.0 g/dL   RDW 14.0 11.5 - 15.5 %   Platelets 228 150 - 400 K/uL   nRBC 0.0 0.0 - 0.2 %   Neutrophils Relative % 76 %  Neutro Abs 4.5 1.7 - 7.7 K/uL   Lymphocytes Relative 13 %   Lymphs Abs 0.8 0.7 - 4.0 K/uL   Monocytes Relative 9 %   Monocytes Absolute 0.6 0.1 - 1.0 K/uL   Eosinophils Relative 2 %   Eosinophils Absolute 0.1 0.0 - 0.5 K/uL   Basophils Relative 0 %   Basophils Absolute 0.0 0.0 - 0.1 K/uL   Immature Granulocytes 0 %   Abs Immature Granulocytes 0.02 0.00 - 0.07 K/uL  Brain natriuretic peptide  Result Value Ref Range   B Natriuretic Peptide 39.0 0.0 - 100.0 pg/mL  Ammonia  Result Value Ref Range   Ammonia 36 (H) 9 - 35 umol/L  D-dimer, quantitative  Result Value Ref Range   D-Dimer, Quant 1.35 (H) 0.00 - 0.50 ug/mL-FEU  Lactic acid, plasma  Result Value Ref Range   Lactic Acid, Venous 0.8 0.5 - 1.9 mmol/L  Lactic acid, plasma  Result Value Ref Range   Lactic Acid, Venous 0.8 0.5 - 1.9 mmol/L   Ct Head Wo Contrast  Result Date: 10/14/2018 CLINICAL DATA:  Altered level consciousness, EXAM: CT HEAD WITHOUT CONTRAST TECHNIQUE: Contiguous axial images were obtained from the base of the skull through the vertex without intravenous contrast. COMPARISON:  None.  FINDINGS: Brain: No evidence of acute territorial infarction, hemorrhage, hydrocephalus,extra-axial collection or mass lesion/mass effect. Normal gray-white differentiation. Ventricles are normal in size and contour. Vascular: No hyperdense vessel or unexpected calcification. Skull: The skull is intact. No fracture or focal lesion identified. Sinuses/Orbits: The visualized paranasal sinuses and mastoid air cells are clear. The orbits and globes intact. Other: None IMPRESSION: No acute intracranial abnormality. Electronically Signed   By: Prudencio Pair M.D.   On: 10/14/2018 19:08   Ct Angio Chest Pe W/cm &/or Wo Cm  Result Date: 10/14/2018 CLINICAL DATA:  Shortness of breath and altered level consciousness EXAM: CT ANGIOGRAPHY CHEST WITH CONTRAST TECHNIQUE: Multidetector CT imaging of the chest was performed using the standard protocol during bolus administration of intravenous contrast. Multiplanar CT image reconstructions and MIPs were obtained to evaluate the vascular anatomy. CONTRAST:  134mL OMNIPAQUE IOHEXOL 350 MG/ML SOLN COMPARISON:  November 14, 2017 FINDINGS: Cardiovascular: There is a optimal opacification of the pulmonary arteries. There is no central,segmental, or subsegmental filling defects within the pulmonary arteries. The heart is normal in size. No pericardial effusion thickening. No evidence right heart strain. There is a normal 3 vessel brachiocephalic arch. Coronary artery and aortic atherosclerosis seen. No aneurysmal dilatation the intrathoracic aorta. Mediastinum/Nodes: No hilar, mediastinal, or axillary adenopathy. Thyroid gland, trachea, and esophagus demonstrate no significant findings. Lungs/Pleura: Extensive centrilobular emphysematous changes are seen again in both upper lung. There is pleural thickening and streaky scarring seen at the left lung base as on the prior exam. Focal area of subpleural nodularity seen at the posterior right lower lung. The lungs are otherwise clear. Upper  Abdomen: No acute abnormalities present in the visualized portions of the upper abdomen. Musculoskeletal: No chest wall abnormality. No acute or significant osseous findings. Review of the MIP images confirms the above findings. IMPRESSION: No central, segmental, or subsegmental pulmonary embolism. Extensive centrilobular emphysematous changes throughout both lungs with scarring and pleural thickening at the left lung base. Tiny non-specific focal subpleural nodularity in the posterior right lower lung, likely focal area of atelectasis. Electronically Signed   By: Prudencio Pair M.D.   On: 10/14/2018 19:12   Dg Chest Port 1 View  Result Date: 10/14/2018 CLINICAL DATA:  Cough and shortness  of breath. EXAM: PORTABLE CHEST 1 VIEW COMPARISON:  08/01/2018 FINDINGS: The heart is mildly enlarged but stable. There is mild tortuosity and calcification of the thoracic aorta. Underlying emphysematous changes are noted. Superimposed peribronchial thickening and increased interstitial markings may suggest superimposed interstitial edema. Possible small effusions. No definite infiltrates. IMPRESSION: 1. Underlying emphysematous changes. 2. Suspect superimposed interstitial pulmonary edema and small effusions. Electronically Signed   By: Marijo Sanes M.D.   On: 10/14/2018 14:49     Procedures  MDM   17:00: On initial evaluation of the patient prior to CT imaging she is refusing to answer any my questions or cooperate with exam   I discussed w/ patient's daughter via telephone who confirms that patient frequently gets mad or frustrated and refuses to communicate, this is not an acute change in her mental status, she feels that the patient could benefit from someone coming to the house to help her, she is unsure if the patient would be willing to have this occur, due to her own current health and is not something she is able to handle.  The patient does live at home alone.  She typically gets around with a walker and  uses oxygen at baseline.  Work-up has been reviewed: CBC: No leukocytosis, anemia improved from prior CMP: Mild electrolyte abnormalities without significant derangement.  BUN slightly elevated, creatinine WNL. Chest x-ray with question for pulmonary edema and small effusions--> BNP obtained WNL. Lactic acid: WNL Ammonia very minimally elevated at 36 D-dimer: Elevated at 1.35 ---> Proceed w/ CTA  CT of the head without acute intracranial abnormality CT Angie of the chest with no PE, findings consistent with emphysema, possible atelectasis.-->  No pneumonia, PE, or signs of fluid overload.  On repeat assessment as detailed in physical exam above patient is alert, oriented x3, and able to communicate with me more appropriately as soon as I discussed possibly of discharge home.  She states that she came in for some mild cough, she states she feels better since being in the emergency department, she is ambulatory with assistance, states she is a walker at home.  She is amicable to the concept of home health, will place order for face-to-face and consult to case management.  At this time patient does not seem to require admission to the hospital, will discharge home with close PCP follow-up and strict return precautions. I discussed results, treatment plan, need for follow-up, and return precautions with the patient. Provided opportunity for questions, patient confirmed understanding and is in agreement with plan.   Findings and plan of care discussed with supervising physician Dr. Sedonia Small who has evaluated patient & is in agreement.   Leafy Kindle 10/14/18 2014  Following discharge patient was transported via [facility] upon arrival to he house patient seemed confused, there was no one home and she was brought back to the emergency department due to concern for safety. No focal neuro deficits. Will board in the ER overnight.    Amaryllis Dyke, PA-C 10/15/18 0131     Maudie Flakes, MD 10/15/18 2250

## 2018-10-14 NOTE — ED Provider Notes (Signed)
[facility] EMERGENCY DEPARTMENT Provider Note   CSN: [id number] Arrival date & time: 10/14/18  1305     History   Chief Complaint Chief Complaint  Patient presents with  . Cough    HPI [name] is a 68 y.o. female.     HPI Patient presents to the emergency department with shortness of breath that the patient states she is unsure when it started.  The patient is unable to answer many questions.  Patient was found by EMS sitting in urine and feces.  The daughter states that the patient does this and some days she is better than others.  The patient is unable to answer many my questions.  She states that her shortness of breath started when it started.  Patient states she uses 400 of oxygen a day at home. Past Medical History:  Diagnosis Date  . Acute MI ([place])    x4, code blue x3  . CAD (coronary artery disease) 2002   Inf STEMI-2002. 2003-cutting balloon + brachytherapy for restenosis; subsequent acute stent thrombosis 06/2010 requiring 2 separate interventions (Zeta stent, then repeat cath with thrombectomy). focal basal inf AK, nl EF; 03/2011: Patent stents, minor nonobst  residual dz, nl EF; neg stress nuclear in 2008 and stress echo in 2009  . Chronic anticoagulation    Warfarin plus ticagrelor  . Chronic respiratory failure ([place]) 08/27/2013   On 2L 02  . COPD (chronic obstructive pulmonary disease) ([place])    02 dependent  . Factor 5 Leiden mutation, heterozygous ([place])   . Factor V Leiden, prothrombin gene mutation ([place]) 2006  . Hyperlipidemia   . Hypothyroidism   . Noncompliance   . Pelvic fracture ([place]) 2009  . Pulmonary embolism ([place]) 2006   Associated with deep vein thrombosis-2006; + factor V Leiden  . Tobacco abuse    50 pack years    Patient Active Problem List   Diagnosis Date Noted  . Endobronchial mass   . Hematemesis   . Elevated troponin   . Malnutrition of moderate degree 05/24/2018  . Acute on chronic respiratory failure ([place]) 05/23/2018  .  Melena 05/23/2018  . 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 Depression 05/23/2018  . Chronic pain syndrome 05/23/2018  . Hypercapnia 05/23/2018  . Acute on chronic respiratory failure with hypoxia and hypercapnia ([place]) 01/29/2018  . Acute and chronic respiratory failure (acute-on-chronic) ([place]) 01/29/2018  . Pressure injury of skin 01/29/2018  . Hyperglycemia 01/21/2018  . COPD with exacerbation ([place]) 01/21/2018  . Vertebral fracture, osteoporotic ([place]) 01/20/2018  . Rectal bleeding 09/05/2017  . Left lower quadrant pain 09/05/2017  . Oxygen dependent 09/05/2017  . Chronic back pain 05/16/2017  . COPD exacerbation ([place]) 02/19/2017  . Tachycardia 02/19/2017  . Influenza A 02/19/2017  . Acute respiratory failure ([place]) 02/19/2017  . Dyspnea 11/26/2014  . Loss of weight 11/26/2014  . Chest pain 08/24/2013  . Encounter for therapeutic drug monitoring 02/13/2013  . Abnormal weight loss 03/21/2012  . Cerebrovascular disease 11/15/2011  . CAD S/P percutaneous coronary angioplasty   . Factor V Leiden, prothrombin gene mutation ([place])   . Hyperlipidemia   . Chronic anticoagulation   . Hypothyroidism 11/23/2006  . TOBACCO ABUSE 11/23/2006  . History of pulmonary embolus (PE) 11/23/2006  . 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 COLONOSCOPY  Approximately 2000   Negative screening study  . COLONOSCOPY WITH PROPOFOL N/A 05/28/2018   Procedure: COLONOSCOPY  WITH PROPOFOL;  Surgeon: Rogene Houston, MD;  Location: [facility] ENDO SUITE;  Service: Endoscopy;  Laterality: N/A;  . CORONARY ANGIOPLASTY  2002, 2003, 2012  . ESOPHAGOGASTRODUODENOSCOPY (EGD) WITH PROPOFOL N/A 05/26/2018   Procedure: ESOPHAGOGASTRODUODENOSCOPY (EGD) WITH PROPOFOL;  Surgeon: Rogene Houston, MD;  Location: [facility] ENDO SUITE;  Service: Endoscopy;  Laterality: N/A;  . LEFT AND RIGHT HEART CATHETERIZATION WITH CORONARY ANGIOGRAM N/A  04/03/2011   Procedure: LEFT AND RIGHT HEART CATHETERIZATION WITH CORONARY ANGIOGRAM;  Surgeon: Sherren Mocha, MD;  Location: [facility] CATH LAB;  Service: Cardiovascular;  Laterality: N/A;  . POLYPECTOMY  05/28/2018   Procedure: POLYPECTOMY;  Surgeon: Rogene Houston, MD;  Location: [facility] ENDO SUITE;  Service: Endoscopy;;  cold snare and biopsy forcep     OB History    Gravida  3   Para  2   Term  2   Preterm      AB  1   Living        SAB      TAB  1   Ectopic      Multiple      Live Births               Home Medications    Prior to Admission medications   Medication Sig Start Date End Date Taking? Authorizing Provider  albuterol (PROVENTIL) (2.5 MG/3ML) 0.083% nebulizer solution USE 1 VIAL BY NEBULIZER EVERY 4 HOURS AS NEEDED FOR WHEEZING. DX: J44.9 Patient taking differently: Take 2.5 mg by nebulization every 4 (four) hours as needed for wheezing or shortness of breath.  01/24/17   Magdalen Spatz, NP  arformoterol (BROVANA) 15 MCG/2ML NEBU Take 2 mLs (15 mcg total) by nebulization 2 (two) times daily. 10/11/18   Martyn Ehrich, NP  bisacodyl (DULCOLAX) 5 MG EC tablet Take 1 tablet (5 mg total) by mouth daily as needed for mild constipation. 08/12/18   [place] Donnamarie Poag, DO  bisoprolol (ZEBETA) 5 MG tablet Take 0.5 tablets (2.5 mg total) by mouth daily. 08/12/18 10/11/18  [place] Eric J, DO  budesonide (PULMICORT) 0.5 MG/2ML nebulizer solution Take 2 mLs (0.5 mg total) by nebulization 2 (two) times daily. Dx: J43.9 01/24/17   Magdalen Spatz, NP  buPROPion (WELLBUTRIN) 100 MG tablet Take 0.5 tablets (50 mg total) by mouth 2 (two) times daily. 08/12/18   [place] Donnamarie Poag, DO  citalopram (CELEXA) 10 MG tablet Take 1 tablet (10 mg total) by mouth daily. 08/12/18   [place] Eric J, DO  Glycerin-Hypromellose-PEG 400 (DRY EYE RELIEF DROPS) 0.2-0.2-1 % SOLN Place 1-2 drops into both eyes 2 (two) times a day.    [provider]  ipratropium-albuterol (DUONEB) 0.5-2.5 (3) MG/3ML SOLN  Take 3 mLs by nebulization 4 (four) times daily. 03/20/17   Magdalen Spatz, NP  levothyroxine (SYNTHROID) 125 MCG tablet Take 1 tablet (125 mcg total) by mouth daily. 08/12/18   [place] Eric J, DO  lidocaine (LIDODERM) 5 % Place 1 patch onto the skin daily. Remove & Discard patch within 12 hours or as directed by MD 08/13/18   [place] Donnamarie Poag, DO  Maltodextrin-Xanthan Gum ([place]) POWD Use with meals 08/12/18   [place] Donnamarie Poag, DO  nitroGLYCERIN (NITROSTAT) 0.4 MG SL tablet Place 1 tablet (0.4 mg total) under the tongue every 5 (five) minutes as needed for chest pain. 10/28/14   Herminio Commons, MD  Omega-3 Fatty Acids (FISH OIL) 600 MG CAPS Take 1 capsule by mouth daily.  [provider]  pantoprazole (PROTONIX) 40 MG tablet Take 1 tablet (40 mg total) by mouth 2 (two) times daily. 08/12/18   [place] Eric J, DO  polyethylene glycol (MIRALAX / GLYCOLAX) 17 g packet Take 17 g by mouth daily as needed for mild constipation. 08/12/18   [place] Eric J, DO  Respiratory Therapy Supplies (FLUTTER) DEVI Use as directed 05/14/15   Javier Glazier, MD  senna (SENOKOT) 8.6 MG TABS tablet Take 2 tablets (17.2 mg total) by mouth at bedtime. 08/12/18   [place] Donnamarie Poag, DO  ticagrelor (BRILINTA) 60 MG TABS tablet Take 1 tablet (60 mg total) by mouth 2 (two) times daily. 08/12/18   [place] Donnamarie Poag, DO  warfarin (COUMADIN) 5 MG tablet 5mg  on Monday, Wednesday, Friday, Sunday 08/12/18   [place] Donnamarie Poag, DO  warfarin (COUMADIN) 7.5 MG tablet 7.5 mg on Tuesday, Thursday, Saturday 08/12/18   [place] Eric J, DO    Family History Family History  Problem Relation Age of Onset  . Emphysema Mother   . Alzheimer's disease Mother   . Emphysema Sister   . Heart disease Father   . Factor V Leiden deficiency Father   . Factor V Leiden deficiency Sister   . Factor V Leiden deficiency Daughter   . Kidney cancer Paternal Grandmother     Social History Social History   Tobacco Use  . Smoking  status: Former Smoker    Packs/day: 0.50    Years: 50.00    Pack years: 25.00    Types: Cigarettes    Start date: 05/10/1968    Quit date: 07/16/2018    Years since quitting: 0.2  . Smokeless tobacco: Never Used  Substance Use Topics  . Alcohol use: No    Alcohol/week: 0.0 standard drinks  . Drug use: No     Allergies   Dilaudid [hydromorphone hcl], Minocycline hcl, Prednisone, Varenicline tartrate, and Zocor [simvastatin - high dose]   Review of Systems Review of Systems Level 5 caveat applies due to altered mental status versus uncooperativeness.  Physical Exam Updated Vital Signs BP (!) 127/53   Pulse 98   Temp 98.5 F (36.9 C) (Oral)   Resp (!) 21   SpO2 100%   Physical Exam Vitals signs and nursing note reviewed.  Constitutional:      General: She is not in acute distress.    Appearance: She is well-developed.  HENT:     Head: Normocephalic and atraumatic.  Eyes:     Pupils: Pupils are equal, round, and reactive to light.  Neck:     Musculoskeletal: Normal range of motion and neck supple.  Cardiovascular:     Rate and Rhythm: Normal rate and regular rhythm.     Heart sounds: Normal heart sounds. No murmur. No friction rub. No gallop.   Pulmonary:     Effort: Pulmonary effort is normal. No respiratory distress.     Breath sounds: Wheezing and rhonchi present.  Abdominal:     General: Bowel sounds are normal. There is no distension.     Palpations: Abdomen is soft.     Tenderness: There is no abdominal tenderness.  Skin:    General: Skin is warm and dry.     Capillary Refill: Capillary refill takes less than 2 seconds.     Findings: No erythema or rash.  Neurological:     Mental Status: She is alert.     Motor: No abnormal muscle tone.     Coordination: Coordination normal.  Psychiatric:  Behavior: Behavior normal.      ED Treatments / Results  Labs (all labs ordered are listed, but only abnormal results are displayed) Labs Reviewed   COMPREHENSIVE METABOLIC PANEL - Abnormal; Notable for the following components:      Result Value   Chloride 97 (*)    CO2 36 (*)    BUN 32 (*)    All other components within normal limits  CBC WITH DIFFERENTIAL/PLATELET - Abnormal; Notable for the following components:   Hemoglobin 11.8 (*)    MCV 105.4 (*)    MCHC 28.6 (*)    All other components within normal limits  SARS CORONAVIRUS 2 (HOSPITAL ORDER, [facility])  BRAIN NATRIURETIC PEPTIDE  AMMONIA  D-DIMER, QUANTITATIVE (NOT AT [facility])  LACTIC ACID, PLASMA  LACTIC ACID, PLASMA    EKG None  Radiology Dg Chest Port 1 View  Result Date: 10/14/2018 CLINICAL DATA:  Cough and shortness of breath. EXAM: PORTABLE CHEST 1 VIEW COMPARISON:  08/01/2018 FINDINGS: The heart is mildly enlarged but stable. There is mild tortuosity and calcification of the thoracic aorta. Underlying emphysematous changes are noted. Superimposed peribronchial thickening and increased interstitial markings may suggest superimposed interstitial edema. Possible small effusions. No definite infiltrates. IMPRESSION: 1. Underlying emphysematous changes. 2. Suspect superimposed interstitial pulmonary edema and small effusions. Electronically Signed   By: Marijo Sanes M.D.   On: 10/14/2018 14:49    Procedures Procedures (including critical care time)  Medications Ordered in ED Medications - No data to display   Initial Impression / Assessment and Plan / ED Course  I have reviewed the triage vital signs and the nursing notes.  Pertinent labs & imaging results that were available during my care of the patient were reviewed by me and considered in my medical decision making (see chart for details).        Patient has altered mental status but the daughter states that some days she is like this in some days she does not.  The patient cannot tell me where she is the month the day of the year.  Patient will need to be reassessed once her  blood tests have returned.  Final Clinical Impressions(s) / ED Diagnoses   Final diagnoses:  None    ED Discharge Orders    None       Dalia Heading, PA-C 10/14/18 1632    Milton Ferguson, MD 10/16/18 1137

## 2018-10-14 NOTE — Progress Notes (Signed)
Earrings taken off in CT for imaging head Earrings taped to pt's nametag (badge) Also ct tech started iv in left ac for injection

## 2018-10-14 NOTE — ED Notes (Signed)
Asked Marjorie Smolder, Network engineer, to call EMS to transport pt home.

## 2018-10-14 NOTE — ED Notes (Signed)
EMS called secretary to notify [facility] ED that they no longer have pt on list to be picked up due to limited [place] of trucks in county and recommended pt find other transport.

## 2018-10-14 NOTE — ED Notes (Signed)
Pt walked in room x2 NT's. Pt oxogen steady at 100%.Gilford Rile not available)

## 2018-10-14 NOTE — ED Notes (Signed)
Spoke with pt daughter who states she is unable to pick patient up from ED.

## 2018-10-14 NOTE — Discharge Instructions (Addendum)
You were seen in the emergency department today for cough and trouble breathing.  Your work-up was overall reassuring.  Please continue to use your breathing treatments/inhalers at home as needed.  Please follow-up with primary care within 2 days.  Return to the ER for new or worsening symptoms or any other concerns  We have placed a consult for home health to come to the house to assist you they should be contacting you soon.

## 2018-10-14 NOTE — ED Triage Notes (Signed)
EMS reports pt is part of the community paramedic program.  Paramedic went out today.  EMS says arrived to find pt sitting in feces and urine.  Reports pt is noncompliant, lives alone and is uncooperative.  Reports pt has history of copd and is on home o2.  Reports cough with pink tinged sputum and recent admission after cardiac arrest.  EMS says pt's daughter, Margreta Journey, lives in [place] and knows pt is coming to the hospital.  Pt alert.

## 2018-10-15 NOTE — TOC Initial Note (Addendum)
Transition of Care [facility]) - Initial/Assessment Note    Patient Details  Name: [name] MRN: [id number] Date of Birth: March 17, 1950  Transition of [facility]) CM/SW Contact:    Trish Mage, LCSW Phone Number: 10/15/2018, 10:26 AM  Clinical Narrative:   Saw patient per consult order re: plan for d/c.  Minimal engagement.  Stated she wants to return home with support of home health, then also stated "I have given up."  Would not elaborate.  Would not give permission to talk to daughter, who lives in [place] area.  Stated "I am finding out more and more about her," but would not elaborate.  Identified no other supports.    I called Stephanie at [facility] who actually went to see patient on Friday just to check on her.  She states that unlike times in the past when she called and patient told her to let herself in with hidden key, patient met her at door and invited her in.  Her home is cluttered and unclean, to the extent that Colletta Maryland is clear that a home health agency would not be willing to come in to work with her. Colletta Maryland states that as long as patient has capacity to make her own decisions, DSS will not intervene in her decisions, poor as they may be. Colletta Maryland suggested that patient could benefit from TTS assessment due to her on-going depression, and statement about giving up.   Colletta Maryland gave me a name and number for [facility] who are working with the patient  Her name is Otis Peak, number is [phone].  Left v/m message.  In meantime, PT evaluation was ordered for patient.   Otis Peak reports she was the one who called EMS on Monday as she had gone to see patient due to DSS letting her know patient had called them reporting that she was out of O2.  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   I called Colletta Maryland to let her know [facility] is closing case.    Prior to lunch, Ms Lopez would not open her eyes to engage with me after my collateral covnersations. With the help of nurse Willette Cluster, she opened her eyes and engaged in a limited manner, but was non-commital about her intentions.  I returned this afternoon, and she was still intially commital.  Stated she did not remember our previous conversation, and  Responded to many of my questions with "I don't know" or I don't care."  In response to my message that I would be recommending returning her home unless she verbalized wanting something else, she replied "I would like to go to rehab to get better."  My attempts to ascertain if she would like me to try to get her into previous rehab facility went for naught, but she did verbalize wanting to go to [place]  or staying in [place].  Overall, [name] is minimally engaged in the treatment planning process. While she appears somewhat confused, she is able to rally when she knows that someone else will not make a decision for her. Will send out FL2 and bed requests to facilities in [place] and [place].           Expected Discharge Plan: Home/Self Care Barriers to Discharge: Other (comment)(PT eval., CSW collateral  information)   Patient Goals and CMS Choice Patient states their goals for this hospitalization and ongoing recovery are:: "I want to go home with home health."      Expected Discharge Plan and Services Expected Discharge Plan: Home/Self Care In-house Referral: Clinical Social Work     Living arrangements for the past 2 months: Single Family Home                                      Prior Living Arrangements/Services Living arrangements for the past 2 months: Single Family Home Lives with:: Self Patient language and need for interpreter reviewed:: Yes Do you feel safe going back to the place where you live?: Yes      Need for Family  Participation in Patient Care: No (Comment)(Sounds like daughter is minimally involved due to patient's non-compliance with treatment plan) Care giver support system in place?: Yes (comment) Current home services: DME Criminal Activity/Legal Involvement Pertinent to Current Situation/Hospitalization: No - Comment as needed  Activities of Daily Living      Permission Sought/Granted Permission sought to share information with : Family Supports Permission granted to share information with : No              Emotional Assessment Appearance:: Appears older than stated age Attitude/Demeanor/Rapport: Avoidant Affect (typically observed): Hopeless Orientation: : Oriented to Self, Oriented to Place Alcohol / Substance Use: Not Applicable Psych Involvement: No (comment)  Admission diagnosis:  EMS Patient Active Problem List   Diagnosis Date Noted  . Endobronchial mass   . Hematemesis   . Elevated troponin   . Malnutrition of moderate degree 05/24/2018  . Acute on chronic respiratory failure ([place]) 05/23/2018  . Melena 05/23/2018  . 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 Depression 05/23/2018  . Chronic pain syndrome 05/23/2018  . Hypercapnia 05/23/2018  . Acute on chronic respiratory failure with hypoxia and hypercapnia ([place]) 01/29/2018  . Acute and chronic respiratory failure (acute-on-chronic) ([place]) 01/29/2018  . Pressure injury of skin 01/29/2018  . Hyperglycemia 01/21/2018  . COPD with exacerbation ([place]) 01/21/2018  . Vertebral fracture, osteoporotic ([place]) 01/20/2018  . Rectal bleeding 09/05/2017  . Left lower quadrant pain 09/05/2017  . Oxygen dependent 09/05/2017  . Chronic back pain 05/16/2017  . COPD exacerbation ([place]) 02/19/2017  . Tachycardia 02/19/2017  . Influenza A 02/19/2017  . Acute respiratory failure ([place]) 02/19/2017  . Dyspnea 11/26/2014  . Loss of weight 11/26/2014  . Chest pain 08/24/2013  . Encounter for therapeutic drug monitoring 02/13/2013  .  Abnormal weight loss 03/21/2012  . Cerebrovascular disease 11/15/2011  . CAD S/P percutaneous coronary angioplasty   . Factor V Leiden, prothrombin gene mutation ([place])   . Hyperlipidemia   . Chronic anticoagulation   . Hypothyroidism 11/23/2006  . TOBACCO ABUSE 11/23/2006  . History of pulmonary embolus (PE) 11/23/2006  . 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   PCP:  [name], NP Pharmacy:   [facility], [place] [address] [place] [address] Phone: [phone] Fax: [place], [place] [address] Phone: [phone] Fax: [phone]     Social Determinants of Health (SDOH) Interventions    Readmission Risk Interventions Readmission Risk Prevention Plan 08/06/2018 07/19/2018 05/29/2018  Transportation Screening Complete - Complete  Medication Review (RN Care Manager) Complete  Referral to Pharmacy Complete  PCP or Specialist appointment within 3-5 days of discharge Complete Not Complete -  PCP/Specialist Appt Not Complete comments - Continued medical workup -  HRI or Home Care Consult Complete Complete Complete  SW Recovery Care/Counseling Consult Complete Complete Complete  Palliative Care Screening Not Applicable Not Applicable Not Applicable  Skilled Nursing Facility Complete Complete Patient Refused  Some recent data might be hidden

## 2018-10-15 NOTE — ED Notes (Signed)
This nurse spoke with patients daughter about pt returning home. Daughter states "I am over 1 hour away and I have TIAs I can't take care of her."

## 2018-10-15 NOTE — ED Notes (Signed)
Pt brought back via [facility] due to there not being anyone at home to care for pt, pt being confused, and not being able to reach anyone to come care for her. [facility] spoke with neighbor who stated that pt has been by living by herself for months. APS called by [employer].

## 2018-10-15 NOTE — Evaluation (Signed)
Physical Therapy Evaluation Patient Details Name: [name] MRN: [id number] DOB: 09-27-1950 Today's Date: 10/15/2018   History of Present Illness  Patient presents to the emergency department with shortness of breath that the patient states she is unsure when it started.  The patient is unable to answer many questions.  Patient was found by EMS sitting in urine and feces.  The daughter states that the patient does this and some days she is better than others.  The patient is unable to answer many my questions.  She states that her shortness of breath started when it started.  Patient states she uses 400 of oxygen a day at home.    Clinical Impression  Patient limited for functional mobility as stated below secondary to BLE weakness, fatigue and poor standing balance.  Patient at risk for falls with frequent scissoring of legs and bumping into near by objects using RW, able to transfer to commode in bathroom with constant verbal/tactile cueing for safety.  Patient will benefit from continued physical therapy in hospital and recommended venue below to increase strength, balance, endurance for safe ADLs and gait.     Follow Up Recommendations SNF;Supervision/Assistance - 24 hour;Supervision for mobility/OOB    Equipment Recommendations  Rolling walker with 5" wheels    Recommendations for Other Services       Precautions / Restrictions Precautions Precautions: Fall Restrictions Weight Bearing Restrictions: No      Mobility  Bed Mobility Overal bed mobility: Needs Assistance Bed Mobility: Supine to Sit;Sit to Supine     Supine to sit: Min guard Sit to supine: Min guard   General bed mobility comments: labored movement  Transfers Overall transfer level: Needs assistance Equipment used: Rolling walker (2 wheeled) Transfers: Sit to/from [facility] Sit to Stand: Min assist Stand pivot transfers: Min assist       General transfer comment: labored movement  for transferring to commode in bathroom with verbal/tactile cueing  Ambulation/Gait Ambulation/Gait assistance: Min assist Gait Distance (Feet): 35 Feet Assistive device: Rolling walker (2 wheeled) Gait Pattern/deviations: Decreased step length - right;Decreased step length - left;Decreased stride length;Scissoring Gait velocity: decreased   General Gait Details: occasional scissoring of legs, stumbling and frequent bumping into nearby objects with RW  Stairs            Wheelchair Mobility    Modified Rankin (Stroke Patients Only)       Balance Overall balance assessment: Needs assistance Sitting-balance support: No upper extremity supported;Feet supported Sitting balance-Leahy Scale: Fair Sitting balance - Comments: fair/good seated at bedside   Standing balance support: Bilateral upper extremity supported;During functional activity Standing balance-Leahy Scale: Fair Standing balance comment: using RW                             Pertinent Vitals/Pain Pain Assessment: No/denies pain    Home Living Family/patient expects to be discharged to:: Private residence Living Arrangements: Alone Available Help at Discharge: Family;Available PRN/intermittently(Patient daughter states she is unable to take care of pt) Type of Home: House Home Access: Stairs to enter       Home Equipment: Gilford Rile - 2 wheels Additional Comments: (Patient poor historian)    Prior Function Level of Independence: Independent with assistive device(s)         Comments: household ambulator with RW/SPC     Hand Dominance        Extremity/Trunk Assessment   Upper Extremity Assessment Upper Extremity Assessment: Generalized weakness  Lower Extremity Assessment Lower Extremity Assessment: Generalized weakness    Cervical / Trunk Assessment Cervical / Trunk Assessment: Normal  Communication   Communication: No difficulties  Cognition Arousal/Alertness:  Awake/alert Behavior During Therapy: WFL for tasks assessed/performed;Flat affect Overall Cognitive Status: Impaired/Different from baseline Area of Impairment: Attention;Safety/judgement;Problem solving                   Current Attention Level: Sustained     Safety/Judgement: Decreased awareness of safety   Problem Solving: Requires verbal cues;Requires tactile cues General Comments: requires occasional repeated verbal/tactile cueing to complete functional tasks      General Comments      Exercises     Assessment/Plan    PT Assessment Patient needs continued PT services  PT Problem List Decreased strength;Decreased activity tolerance;Decreased balance;Decreased mobility       PT Treatment Interventions Gait training;Stair training;Functional mobility training;Therapeutic activities;Therapeutic exercise;Balance training;Patient/family education    PT Goals (Current goals can be found in the Care Plan section)  Acute Rehab PT Goals Patient Stated Goal: Return home PT Goal Formulation: With patient Time For Goal Achievement: 10/29/18 Potential to Achieve Goals: Good    Frequency Min 2X/week   Barriers to discharge        Co-evaluation               AM-PAC PT "6 Clicks" Mobility  Outcome Measure Help needed turning from your back to your side while in a flat bed without using bedrails?: A Little Help needed moving from lying on your back to sitting on the side of a flat bed without using bedrails?: A Little Help needed moving to and from a bed to a chair (including a wheelchair)?: A Little Help needed standing up from a chair using your arms (e.g., wheelchair or bedside chair)?: A Little Help needed to walk in hospital room?: A Lot Help needed climbing 3-5 steps with a railing? : A Lot 6 Click Score: 16    End of Session Equipment Utilized During Treatment: Oxygen Activity Tolerance: Patient tolerated treatment well;Patient limited by  fatigue Patient left: in bed;with call bell/phone within reach Nurse Communication: Mobility status PT Visit Diagnosis: Unsteadiness on feet (R26.81);Other abnormalities of gait and mobility (R26.89);Muscle weakness (generalized) (M62.81)    Time: [id number] PT Time Calculation (min) (ACUTE ONLY): 28 min   Charges:   PT Evaluation $PT Eval Moderate Complexity: 1 Mod PT Treatments $Therapeutic Activity: 23-37 mins        12:25 PM, 10/15/18 Lonell Grandchild, MPT Physical Therapist with [facility] 336 [phone] office [phone] mobile phone

## 2018-10-15 NOTE — Plan of Care (Signed)
  Problem: Acute Rehab PT Goals(only PT should resolve) Goal: Pt Will Go Supine/Side To Sit Outcome: Progressing Flowsheets (Taken 10/15/2018 1226) Pt will go Supine/Side to Sit: with modified independence Goal: Patient Will Transfer Sit To/From Stand Outcome: Progressing Flowsheets (Taken 10/15/2018 1226) Patient will transfer sit to/from stand: with supervision Goal: Pt Will Transfer Bed To Chair/Chair To Bed Outcome: Progressing Flowsheets (Taken 10/15/2018 1226) Pt will Transfer Bed to Chair/Chair to Bed: with supervision Goal: Pt Will Ambulate Outcome: Progressing Flowsheets (Taken 10/15/2018 1226) Pt will Ambulate:  100 feet  with supervision  with rolling walker   12:27 PM, 10/15/18 Lonell Grandchild, MPT Physical Therapist with [facility] [phone] office [phone] mobile phone

## 2018-10-15 NOTE — ED Notes (Signed)
Pt yelling from room saying "help, help, help". This nurse walked in room to check on pt. Pt states "can you keep me company, why do you have that gown on, does someone have the virus?" this nurse told pt that I cannot give that information out. Pt then states she had soiled the bed and needed to be change. pts Bed linen changed.

## 2018-10-15 NOTE — ED Notes (Signed)
PT doing Eval at this time.

## 2018-10-16 MED ORDER — WARFARIN SODIUM 5 MG PO TABS
5.0000 mg | ORAL_TABLET | ORAL | Status: DC
  Administered 2018-10-16: 5 mg via ORAL
  Filled 2018-10-16: qty 1

## 2018-10-16 MED ORDER — BISOPROLOL FUMARATE 5 MG PO TABS: 2.5000 mg | ORAL_TABLET | Freq: Every day | ORAL | Status: DC

## 2018-10-16 MED ORDER — SENNA 8.6 MG PO TABS: 2.0000 | ORAL_TABLET | Freq: Every day | ORAL | Status: DC

## 2018-10-16 MED ORDER — PANTOPRAZOLE SODIUM 40 MG PO TBEC
40.0000 mg | DELAYED_RELEASE_TABLET | Freq: Two times a day (BID) | ORAL | Status: DC
  Administered 2018-10-16: 40 mg via ORAL
  Filled 2018-10-16: qty 1

## 2018-10-16 MED ORDER — GLYCERIN-HYPROMELLOSE-PEG 400 0.2-0.2-1 % OP SOLN: 1.0000 [drp] | Freq: Two times a day (BID) | OPHTHALMIC | Status: DC

## 2018-10-16 MED ORDER — FISH OIL 600 MG PO CAPS: 1.0000 | ORAL_CAPSULE | Freq: Every day | ORAL | Status: DC

## 2018-10-16 MED ORDER — LEVOTHYROXINE SODIUM 50 MCG PO TABS
125.0000 ug | ORAL_TABLET | Freq: Every day | ORAL | Status: DC
  Administered 2018-10-16: 125 ug via ORAL
  Filled 2018-10-16: qty 3

## 2018-10-16 MED ORDER — BISACODYL 5 MG PO TBEC: 5.0000 mg | DELAYED_RELEASE_TABLET | Freq: Every day | ORAL | Status: DC | PRN

## 2018-10-16 MED ORDER — WARFARIN SODIUM 7.5 MG PO TABS: 7.5000 mg | ORAL_TABLET | ORAL | Status: DC

## 2018-10-16 MED ORDER — CITALOPRAM HYDROBROMIDE 10 MG PO TABS: 10.0000 mg | ORAL_TABLET | Freq: Every day | ORAL | Status: DC

## 2018-10-16 MED ORDER — TICAGRELOR 60 MG PO TABS: 60.0000 mg | ORAL_TABLET | Freq: Two times a day (BID) | ORAL | Status: DC

## 2018-10-16 MED ORDER — LIDOCAINE 5 % EX PTCH
1.0000 | MEDICATED_PATCH | CUTANEOUS | Status: DC
  Administered 2018-10-16: 1 via TRANSDERMAL
  Filled 2018-10-16: qty 1

## 2018-10-16 MED ORDER — BUPROPION HCL 100 MG PO TABS: 50.0000 mg | ORAL_TABLET | Freq: Two times a day (BID) | ORAL | Status: DC

## 2018-10-16 MED ORDER — POLYETHYLENE GLYCOL 3350 17 G PO PACK: 17.0000 g | PACK | Freq: Every day | ORAL | Status: DC | PRN

## 2018-10-16 NOTE — TOC Transition Note (Addendum)
Transition of Care [facility]) - CM/SW Discharge Note   Patient Details  Name: [name] MRN: [id number] Date of Birth: 1950/07/19  [facility] CM/SW Contact:  Trish Mage, LCSW Phone Number: 10/16/2018, 1:47 PM   Clinical Narrative:   Went to patient with news of bed offer at [facility], and explained the authorization process.  She responded by telling me that I was just costing her money, and went on to explain that she does not want to go to rehab and pay a co-pay.  Stated she prefers to go home.  When I asked what changed since yesterday, she replied that nothing changed.  She did not want to go yesterday either. "Everybody was telling me I could not go home."  I told her she can make the decision to go home, and I would let the Dr know of her decision.  I also clarified with her that she would not go home with any services, which she acknowledged.  When asked who she would call in a pinch, she replied that she has no one.  I expressed concern, and she responded "that has been how it is for a long time. I manage myself." Let Dr and nurse know of patients desire.  Nursing will let transportation know patient has capacity to make her own decisions, and is choosing to live home alone in current conditions. TOC sign off.  3:30 addendum:  EMS scheduling states they will not take patient home due to condition of home.  Called Joanie with [facility] program, who assured me she would arrange transportation for patient via Con[facility] transportation.  I gave her room number.    Final next level of care: Home/Self Care Barriers to Discharge: Other (comment)(PT eval., CSW collateral information)   Patient Goals and CMS Choice Patient states their goals for this hospitalization and ongoing recovery are:: "I want to go home with home health."      Discharge Placement                       Discharge Plan and Services In-house Referral: Clinical Social Work                                    Social Determinants of Health (SDOH) Interventions     Readmission Risk Interventions Readmission Risk Prevention Plan 08/06/2018 07/19/2018 05/29/2018  Transportation Screening Complete - Complete  Medication Review (RN Care Manager) Complete Referral to Pharmacy Complete  PCP or Specialist appointment within 3-5 days of discharge Complete Not Complete -  PCP/Specialist Appt Not Complete comments - Continued medical workup -  HRI or Home Care Consult Complete Complete Complete  SW Recovery Care/Counseling Consult Complete Complete Complete  Palliative Care Screening Not Applicable Not Applicable Not Applicable  Skilled Nursing Facility Complete Complete Patient Refused  Some recent data might be hidden

## 2018-10-16 NOTE — ED Provider Notes (Signed)
I discussed with the Education officer, museum.  The patient is refusing placement.  She is awake, alert, and oriented to self, person, place, and time/situation.  I think this is likely a poor decision to not go to a rehab facility for now, as a sound like it is difficult to get home health into her house.  However she seems to understand this and still refuses.  Will discharge home.   Sherwood Gambler, MD 10/16/18 [phone]

## 2018-10-16 NOTE — ED Notes (Signed)
Patient sitting up.   Lunch tray given.

## 2018-10-16 NOTE — ED Notes (Signed)
Patient left unit by [facility]

## 2018-10-16 NOTE — ED Notes (Signed)
Pt called for nurse. This nurse went into room, pt states she wants to go home and asks why shes here. This nurse told pt that social work was looking for placement for snf, pt states "I dont need to go anywhere but home, I want to go home." md notified

## 2018-11-01 ENCOUNTER — Encounter (HOSPITAL_COMMUNITY): Payer: Self-pay | Admitting: *Deleted

## 2018-11-01 ENCOUNTER — Other Ambulatory Visit: Payer: Self-pay

## 2018-11-01 ENCOUNTER — Emergency Department (HOSPITAL_COMMUNITY): Payer: Medicare Other

## 2018-11-01 ENCOUNTER — Inpatient Hospital Stay (HOSPITAL_COMMUNITY)
Admission: EM | Admit: 2018-11-01 | Discharge: 2018-11-05 | DRG: 189 | Disposition: A | Discharge status: Home / Self Care | Payer: Medicare Other | Attending: Family Medicine | Admitting: Family Medicine

## 2018-11-01 DIAGNOSIS — Z8249 Family history of ischemic heart disease and other diseases of the circulatory system: Secondary | ICD-10-CM

## 2018-11-01 DIAGNOSIS — Z9119 Patient's noncompliance with other medical treatment and regimen: Secondary | ICD-10-CM | POA: Diagnosis not present

## 2018-11-01 DIAGNOSIS — N390 Urinary tract infection, site not specified: Secondary | ICD-10-CM | POA: Diagnosis present

## 2018-11-01 DIAGNOSIS — E872 Acidosis: Secondary | ICD-10-CM

## 2018-11-01 DIAGNOSIS — B952 Enterococcus as the cause of diseases classified elsewhere: Secondary | ICD-10-CM | POA: Diagnosis present

## 2018-11-01 DIAGNOSIS — J9622 Acute and chronic respiratory failure with hypercapnia: Principal | ICD-10-CM | POA: Diagnosis present

## 2018-11-01 DIAGNOSIS — Z7951 Long term (current) use of inhaled steroids: Secondary | ICD-10-CM | POA: Diagnosis not present

## 2018-11-01 DIAGNOSIS — I252 Old myocardial infarction: Secondary | ICD-10-CM | POA: Diagnosis not present

## 2018-11-01 DIAGNOSIS — D6852 Prothrombin gene mutation: Secondary | ICD-10-CM | POA: Diagnosis present

## 2018-11-01 DIAGNOSIS — Z87891 Personal history of nicotine dependence: Secondary | ICD-10-CM

## 2018-11-01 DIAGNOSIS — Z825 Family history of asthma and other chronic lower respiratory diseases: Secondary | ICD-10-CM

## 2018-11-01 DIAGNOSIS — E785 Hyperlipidemia, unspecified: Secondary | ICD-10-CM | POA: Diagnosis present

## 2018-11-01 DIAGNOSIS — I5033 Acute on chronic diastolic (congestive) heart failure: Secondary | ICD-10-CM | POA: Diagnosis present

## 2018-11-01 DIAGNOSIS — Z23 Encounter for immunization: Secondary | ICD-10-CM | POA: Diagnosis not present

## 2018-11-01 DIAGNOSIS — Z79899 Other long term (current) drug therapy: Secondary | ICD-10-CM

## 2018-11-01 DIAGNOSIS — Z9861 Coronary angioplasty status: Secondary | ICD-10-CM

## 2018-11-01 DIAGNOSIS — Z20828 Contact with and (suspected) exposure to other viral communicable diseases: Secondary | ICD-10-CM | POA: Diagnosis present

## 2018-11-01 DIAGNOSIS — Z7902 Long term (current) use of antithrombotics/antiplatelets: Secondary | ICD-10-CM

## 2018-11-01 DIAGNOSIS — G9341 Metabolic encephalopathy: Secondary | ICD-10-CM | POA: Diagnosis present

## 2018-11-01 DIAGNOSIS — J441 Chronic obstructive pulmonary disease with (acute) exacerbation: Secondary | ICD-10-CM | POA: Diagnosis present

## 2018-11-01 DIAGNOSIS — I5021 Acute systolic (congestive) heart failure: Secondary | ICD-10-CM | POA: Diagnosis not present

## 2018-11-01 DIAGNOSIS — Z79890 Hormone replacement therapy (postmenopausal): Secondary | ICD-10-CM

## 2018-11-01 DIAGNOSIS — F329 Major depressive disorder, single episode, unspecified: Secondary | ICD-10-CM | POA: Diagnosis present

## 2018-11-01 DIAGNOSIS — Z820 Family history of epilepsy and other diseases of the nervous system: Secondary | ICD-10-CM

## 2018-11-01 DIAGNOSIS — Z9981 Dependence on supplemental oxygen: Secondary | ICD-10-CM | POA: Diagnosis not present

## 2018-11-01 DIAGNOSIS — I2510 Atherosclerotic heart disease of native coronary artery without angina pectoris: Secondary | ICD-10-CM | POA: Diagnosis present

## 2018-11-01 DIAGNOSIS — Z885 Allergy status to narcotic agent status: Secondary | ICD-10-CM

## 2018-11-01 DIAGNOSIS — Z7901 Long term (current) use of anticoagulants: Secondary | ICD-10-CM

## 2018-11-01 DIAGNOSIS — F419 Anxiety disorder, unspecified: Secondary | ICD-10-CM | POA: Diagnosis present

## 2018-11-01 DIAGNOSIS — J9621 Acute and chronic respiratory failure with hypoxia: Secondary | ICD-10-CM | POA: Diagnosis present

## 2018-11-01 DIAGNOSIS — Z86711 Personal history of pulmonary embolism: Secondary | ICD-10-CM | POA: Diagnosis present

## 2018-11-01 DIAGNOSIS — E8729 Other acidosis: Secondary | ICD-10-CM

## 2018-11-01 DIAGNOSIS — E039 Hypothyroidism, unspecified: Secondary | ICD-10-CM | POA: Diagnosis present

## 2018-11-01 DIAGNOSIS — I5023 Acute on chronic systolic (congestive) heart failure: Secondary | ICD-10-CM | POA: Diagnosis present

## 2018-11-01 DIAGNOSIS — D6851 Activated protein C resistance: Secondary | ICD-10-CM | POA: Diagnosis present

## 2018-11-01 DIAGNOSIS — Z888 Allergy status to other drugs, medicaments and biological substances status: Secondary | ICD-10-CM

## 2018-11-01 LAB — URINALYSIS, ROUTINE W REFLEX MICROSCOPIC
Bilirubin Urine: NEGATIVE
Glucose, UA: NEGATIVE mg/dL
Ketones, ur: 20 mg/dL — AB
Leukocytes,Ua: NEGATIVE
Nitrite: NEGATIVE
Protein, ur: 100 mg/dL — AB
Specific Gravity, Urine: 1.018 (ref 1.005–1.030)
pH: 6 (ref 5.0–8.0)

## 2018-11-01 LAB — RAPID URINE DRUG SCREEN, HOSP PERFORMED
Amphetamines: NOT DETECTED
Barbiturates: NOT DETECTED
Benzodiazepines: NOT DETECTED
Cocaine: NOT DETECTED
Opiates: NOT DETECTED
Tetrahydrocannabinol: NOT DETECTED

## 2018-11-01 LAB — ETHANOL: Alcohol, Ethyl (B): <10 mg/dL (ref <10)

## 2018-11-01 LAB — COMPREHENSIVE METABOLIC PANEL
ALT: 13 U/L (ref 0–44)
AST: 17 U/L (ref 15–41)
Albumin: 3.5 g/dL (ref 3.5–5.0)
Alkaline Phosphatase: 93 U/L (ref 38–126)
Anion gap: 12 (ref 5–15)
BUN: 31 mg/dL — ABNORMAL HIGH (ref 8–23)
CO2: 33 mmol/L — ABNORMAL HIGH (ref 22–32)
Calcium: 8.7 mg/dL — ABNORMAL LOW (ref 8.9–10.3)
Chloride: 98 mmol/L (ref 98–111)
Creatinine, Ser: 0.68 mg/dL (ref 0.44–1.00)
GFR calc Af Amer: >60 mL/min (ref >60)
GFR calc non Af Amer: >60 mL/min (ref >60)
Glucose, Bld: 99 mg/dL (ref 70–99)
Potassium: 4.1 mmol/L (ref 3.5–5.1)
Sodium: 143 mmol/L (ref 135–145)
Total Bilirubin: 0.5 mg/dL (ref 0.3–1.2)
Total Protein: 6.6 g/dL (ref 6.5–8.1)

## 2018-11-01 LAB — CBC
HCT: 36.1 % (ref 36.0–46.0)
Hemoglobin: 10.4 g/dL — ABNORMAL LOW (ref 12.0–15.0)
MCH: 29.8 pg (ref 26.0–34.0)
MCHC: 28.8 g/dL — ABNORMAL LOW (ref 30.0–36.0)
MCV: 103.4 fL — ABNORMAL HIGH (ref 80.0–100.0)
Platelets: 199 10*3/uL (ref 150–400)
RBC: 3.49 MIL/uL — ABNORMAL LOW (ref 3.87–5.11)
RDW: 14.2 % (ref 11.5–15.5)
WBC: 5.4 10*3/uL (ref 4.0–10.5)
nRBC: 0 % (ref 0.0–0.2)

## 2018-11-01 LAB — PROTIME-INR
INR: 2.8 — ABNORMAL HIGH (ref 0.8–1.2)
Prothrombin Time: 29.3 seconds — ABNORMAL HIGH (ref 11.4–15.2)

## 2018-11-01 LAB — BLOOD GAS, ARTERIAL
Acid-Base Excess: 12.2 mmol/L — ABNORMAL HIGH (ref 0.0–2.0)
Bicarbonate: 33.8 mmol/L — ABNORMAL HIGH (ref 20.0–28.0)
FIO2: 36
O2 Saturation: 99 %
Patient temperature: 36.7
pCO2 arterial: 98.5 mmHg (ref 32.0–48.0)
pH, Arterial: 7.232 — ABNORMAL LOW (ref 7.350–7.450)
pO2, Arterial: 258 mmHg — ABNORMAL HIGH (ref 83.0–108.0)

## 2018-11-01 LAB — BRAIN NATRIURETIC PEPTIDE: B Natriuretic Peptide: 591 pg/mL — ABNORMAL HIGH (ref 0.0–100.0)

## 2018-11-01 LAB — AMMONIA: Ammonia: 18 umol/L (ref 9–35)

## 2018-11-01 LAB — SARS CORONAVIRUS 2 BY RT PCR (HOSPITAL ORDER, PERFORMED IN ~~LOC~~ HOSPITAL LAB): SARS Coronavirus 2: NEGATIVE

## 2018-11-01 LAB — CBG MONITORING, ED: Glucose-Capillary: 102 mg/dL — ABNORMAL HIGH (ref 70–99)

## 2018-11-01 MED ORDER — IPRATROPIUM-ALBUTEROL 0.5-2.5 (3) MG/3ML IN SOLN
3.0000 mL | Freq: Four times a day (QID) | RESPIRATORY_TRACT | Status: DC
  Administered 2018-11-01 – 2018-11-04 (×12): 3 mL via RESPIRATORY_TRACT
  Filled 2018-11-01 (×2): qty 3
  Filled 2018-11-01: qty 30
  Filled 2018-11-01 (×9): qty 3

## 2018-11-01 MED ORDER — FUROSEMIDE 10 MG/ML IJ SOLN
40.0000 mg | Freq: Two times a day (BID) | INTRAMUSCULAR | Status: DC
  Administered 2018-11-02 – 2018-11-05 (×7): 40 mg via INTRAVENOUS
  Filled 2018-11-01 (×7): qty 4

## 2018-11-01 MED ORDER — LORAZEPAM 2 MG/ML IJ SOLN
0.5000 mg | Freq: Four times a day (QID) | INTRAMUSCULAR | Status: DC | PRN
  Administered 2018-11-02 – 2018-11-04 (×3): 0.5 mg via INTRAVENOUS
  Filled 2018-11-01 (×3): qty 1

## 2018-11-01 MED ORDER — LEVOTHYROXINE SODIUM 25 MCG PO TABS
125.0000 ug | ORAL_TABLET | Freq: Every day | ORAL | Status: DC
  Administered 2018-11-02 – 2018-11-05 (×4): 125 ug via ORAL
  Filled 2018-11-01 (×3): qty 1
  Filled 2018-11-01: qty 3

## 2018-11-01 MED ORDER — MAGNESIUM SULFATE 2 GM/50ML IV SOLN
2.0000 g | Freq: Once | INTRAVENOUS | Status: AC
  Administered 2018-11-01: 2 g via INTRAVENOUS
  Filled 2018-11-01: qty 50

## 2018-11-01 MED ORDER — METHYLPREDNISOLONE SODIUM SUCC 125 MG IJ SOLR
60.0000 mg | Freq: Four times a day (QID) | INTRAMUSCULAR | Status: DC
  Administered 2018-11-01 – 2018-11-02 (×3): 60 mg via INTRAVENOUS
  Filled 2018-11-01 (×3): qty 2

## 2018-11-01 MED ORDER — POLYVINYL ALCOHOL 1.4 % OP SOLN
1.0000 [drp] | Freq: Two times a day (BID) | OPHTHALMIC | Status: DC
  Administered 2018-11-02: 1 [drp] via OPHTHALMIC
  Administered 2018-11-02: 2 [drp] via OPHTHALMIC
  Administered 2018-11-03 – 2018-11-04 (×3): 1 [drp] via OPHTHALMIC
  Administered 2018-11-04 – 2018-11-05 (×2): 2 [drp] via OPHTHALMIC
  Filled 2018-11-01 (×3): qty 15

## 2018-11-01 MED ORDER — SENNA 8.6 MG PO TABS
2.0000 | ORAL_TABLET | Freq: Every day | ORAL | Status: DC
  Administered 2018-11-02 – 2018-11-04 (×3): 17.2 mg via ORAL
  Filled 2018-11-01 (×5): qty 2

## 2018-11-01 MED ORDER — SODIUM CHLORIDE 0.9 % IV BOLUS
500.0000 mL | Freq: Once | INTRAVENOUS | Status: AC
  Administered 2018-11-01: 500 mL via INTRAVENOUS

## 2018-11-01 MED ORDER — PANTOPRAZOLE SODIUM 40 MG PO TBEC
40.0000 mg | DELAYED_RELEASE_TABLET | Freq: Two times a day (BID) | ORAL | Status: DC
  Administered 2018-11-02 – 2018-11-05 (×7): 40 mg via ORAL
  Filled 2018-11-01 (×7): qty 1

## 2018-11-01 MED ORDER — BUDESONIDE 0.5 MG/2ML IN SUSP
0.5000 mg | Freq: Two times a day (BID) | RESPIRATORY_TRACT | Status: DC
  Administered 2018-11-01 – 2018-11-05 (×8): 0.5 mg via RESPIRATORY_TRACT
  Filled 2018-11-01 (×7): qty 2

## 2018-11-01 MED ORDER — CITALOPRAM HYDROBROMIDE 20 MG PO TABS
10.0000 mg | ORAL_TABLET | Freq: Every day | ORAL | Status: DC
  Administered 2018-11-02 – 2018-11-05 (×4): 10 mg via ORAL
  Filled 2018-11-01 (×6): qty 1

## 2018-11-01 MED ORDER — SODIUM CHLORIDE 0.9 % IV SOLN
1.0000 g | INTRAVENOUS | Status: DC
  Administered 2018-11-01: 1 g via INTRAVENOUS
  Filled 2018-11-01: qty 10

## 2018-11-01 MED ORDER — METHYLPREDNISOLONE SODIUM SUCC 125 MG IJ SOLR
125.0000 mg | Freq: Once | INTRAMUSCULAR | Status: AC
  Administered 2018-11-01: 125 mg via INTRAVENOUS
  Filled 2018-11-01: qty 2

## 2018-11-01 MED ORDER — BUPROPION HCL 100 MG PO TABS
50.0000 mg | ORAL_TABLET | Freq: Two times a day (BID) | ORAL | Status: DC
  Administered 2018-11-02 – 2018-11-05 (×7): 50 mg via ORAL
  Filled 2018-11-01 (×5): qty 0.5
  Filled 2018-11-01: qty 1
  Filled 2018-11-01 (×3): qty 0.5
  Filled 2018-11-01: qty 1
  Filled 2018-11-01 (×2): qty 0.5

## 2018-11-01 MED ORDER — RESOURCE THICKENUP CLEAR PO POWD
ORAL | Status: DC | PRN
  Filled 2018-11-01: qty 125

## 2018-11-01 MED ORDER — TICAGRELOR 60 MG PO TABS
60.0000 mg | ORAL_TABLET | Freq: Two times a day (BID) | ORAL | Status: DC
  Administered 2018-11-02 – 2018-11-05 (×7): 60 mg via ORAL
  Filled 2018-11-01 (×10): qty 1

## 2018-11-01 MED ORDER — POTASSIUM CHLORIDE CRYS ER 20 MEQ PO TBCR
40.0000 meq | EXTENDED_RELEASE_TABLET | Freq: Two times a day (BID) | ORAL | Status: DC
  Administered 2018-11-02 – 2018-11-05 (×7): 40 meq via ORAL
  Filled 2018-11-01 (×7): qty 2

## 2018-11-01 MED ORDER — WARFARIN - PHARMACIST DOSING INPATIENT
Freq: Every day | Status: DC
  Administered 2018-11-04: 16:00:00

## 2018-11-01 MED ORDER — ALBUTEROL SULFATE HFA 108 (90 BASE) MCG/ACT IN AERS
4.0000 | INHALATION_SPRAY | Freq: Once | RESPIRATORY_TRACT | Status: AC
  Administered 2018-11-01: 4 via RESPIRATORY_TRACT
  Filled 2018-11-01: qty 6.7

## 2018-11-01 MED ORDER — BISOPROLOL FUMARATE 5 MG PO TABS
2.5000 mg | ORAL_TABLET | Freq: Every day | ORAL | Status: DC
  Administered 2018-11-02 – 2018-11-05 (×4): 2.5 mg via ORAL
  Filled 2018-11-01 (×3): qty 0.5
  Filled 2018-11-01 (×3): qty 1

## 2018-11-01 MED ORDER — ARFORMOTEROL TARTRATE 15 MCG/2ML IN NEBU
15.0000 ug | INHALATION_SOLUTION | Freq: Two times a day (BID) | RESPIRATORY_TRACT | Status: DC
  Administered 2018-11-01 – 2018-11-05 (×8): 15 ug via RESPIRATORY_TRACT
  Filled 2018-11-01 (×6): qty 2

## 2018-11-01 MED ORDER — BISACODYL 5 MG PO TBEC: 5.0000 mg | DELAYED_RELEASE_TABLET | Freq: Every day | ORAL | Status: DC | PRN

## 2018-11-01 MED ORDER — POLYETHYLENE GLYCOL 3350 17 G PO PACK: 17.0000 g | PACK | Freq: Every day | ORAL | Status: DC | PRN

## 2018-11-01 NOTE — ED Notes (Signed)
Pt resting comfortably at this time.

## 2018-11-01 NOTE — Progress Notes (Signed)
ANTICOAGULATION CONSULT NOTE - Initial Consult  Pharmacy Consult for Warfarin Indication: History of PE/DVT, Factor V Leiden mutation  Allergies  Allergen Reactions  . Dilaudid [Hydromorphone Hcl] Hives and Nausea Only  . Minocycline Hcl     REACTION: Dizzy  . Prednisone     REACTION: feels like throat swelling, hallucinations  . Varenicline Tartrate     REACTION: Dizzy(chantix)   . Zocor [Simvastatin - High Dose] Other (See Comments)    myalgia    Patient Measurements: Height: 5\' 2"  (157.5 cm) Weight: 149 lb 14.6 oz (68 kg) IBW/kg (Calculated) : 50.1  Vital Signs: Temp: 99.1 F (37.3 C) (10/23 1436) Temp Source: Oral (10/23 1436) BP: 132/58 (10/23 1830) Pulse Rate: 85 (10/23 1830)  Labs: Recent Labs    11/01/18 1455 11/01/18 1740  HGB 10.4*  --   HCT 36.1  --   PLT 199  --   LABPROT  --  29.3*  INR  --  2.8*  CREATININE 0.68  --     Estimated Creatinine Clearance: 60.9 mL/min (by C-G formula based on SCr of 0.68 mg/dL).   Medical History: Past Medical History:  Diagnosis Date  . Acute MI ([place])    x4, code blue x3  . CAD (coronary artery disease) 2002   Inf STEMI-2002. 2003-cutting balloon + brachytherapy for restenosis; subsequent acute stent thrombosis 06/2010 requiring 2 separate interventions (Zeta stent, then repeat cath with thrombectomy). focal basal inf AK, nl EF; 03/2011: Patent stents, minor nonobst  residual dz, nl EF; neg stress nuclear in 2008 and stress echo in 2009  . Chronic anticoagulation    Warfarin plus ticagrelor  . Chronic respiratory failure ([place]) 08/27/2013   On 2L 02  . 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 Factor 5 Leiden mutation, heterozygous ([place])   . Factor V Leiden, prothrombin gene mutation ([place]) 2006  . Hyperlipidemia   . Hypothyroidism   . Noncompliance   . Pelvic fracture ([place]) 2009  . Pulmonary embolism ([place]) 2006   Associated with deep vein thrombosis-2006; + factor V Leiden  .  Tobacco abuse    50 pack years    Medications:  Scheduled:  . furosemide  40 mg Intravenous Q12H  . potassium chloride  40 mEq Oral BID   Infusions:   PRN: LORazepam  Assessment: 68 yo female with factor V Leiden with history of PE on chronic anticoagulation with warfarin.  Pharmacy consulted to continue warfarin dosing while inpatient.  INR is therapeutic at 2.8, home dose reported as 5mg  MWF, 7.5mg  TThSaSu. Hgb low at 10.4, but consistent with previous values this year.  Goal of Therapy:  INR 2-3   Plan:  Patient already had warfarin dose today and INR is therapeutic Daily PT/INR  Peggyann Juba, PharmD, BCPS 11/01/2018,7:04 PM

## 2018-11-01 NOTE — ED Notes (Signed)
Called lab to check on UA results.

## 2018-11-01 NOTE — ED Notes (Signed)
Respiratory called and informed Covid negative.

## 2018-11-01 NOTE — ED Notes (Signed)
Called Lab, Lab to changed send out SARS test to Rapid test.

## 2018-11-01 NOTE — ED Notes (Signed)
Date and time results received: 11/01/18 1657 (use smartphrase ".now" to insert current time)  Test:pCO2 Critical Value:98.5 Name of Provider Notified: Dr Roderic Palau Orders Received? Or Actions Taken?:NA

## 2018-11-01 NOTE — ED Provider Notes (Signed)
[facility] EMERGENCY DEPARTMENT Provider Note   CSN: [id number] Arrival date & time: 11/01/18  1423     History   Chief Complaint Chief Complaint  Patient presents with  . Altered Mental Status    HPI [name] is a 68 y.o. female.     Patient presents with confusion.  Patient has a history of respiratory failure from COPD  The history is provided by the EMS personnel. No language interpreter was used.  Altered Mental Status Presenting symptoms: behavior changes   Severity:  Severe Most recent episode:  Today Episode history:  Continuous Timing:  Constant Progression:  Worsening Chronicity:  Recurrent Context: not alcohol use   Associated symptoms: no abdominal pain     Past Medical History:  Diagnosis Date  . Acute MI ([place])    x4, code blue x3  . CAD (coronary artery disease) 2002   Inf STEMI-2002. 2003-cutting balloon + brachytherapy for restenosis; subsequent acute stent thrombosis 06/2010 requiring 2 separate interventions (Zeta stent, then repeat cath with thrombectomy). focal basal inf AK, nl EF; 03/2011: Patent stents, minor nonobst  residual dz, nl EF; neg stress nuclear in 2008 and stress echo in 2009  . Chronic anticoagulation    Warfarin plus ticagrelor  . Chronic respiratory failure ([place]) 08/27/2013   On 2L 02  . COPD (chronic obstructive pulmonary disease) ([place])    02 dependent  . Factor 5 Leiden mutation, heterozygous ([place])   . Factor V Leiden, prothrombin gene mutation ([place]) 2006  . Hyperlipidemia   . Hypothyroidism   . Noncompliance   . Pelvic fracture ([place]) 2009  . Pulmonary embolism ([place]) 2006   Associated with deep vein thrombosis-2006; + factor V Leiden  . Tobacco abuse    50 pack years    Patient Active Problem List   Diagnosis Date Noted  . Endobronchial mass   . Hematemesis   . Elevated troponin   . Malnutrition of moderate degree 05/24/2018  . Acute on chronic respiratory failure ([place]) 05/23/2018  . Melena 05/23/2018  .  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 Depression 05/23/2018  . Chronic pain syndrome 05/23/2018  . Hypercapnia 05/23/2018  . Acute on chronic respiratory failure with hypoxia and hypercapnia ([place]) 01/29/2018  . Acute and chronic respiratory failure (acute-on-chronic) ([place]) 01/29/2018  . Pressure injury of skin 01/29/2018  . Hyperglycemia 01/21/2018  . COPD with exacerbation ([place]) 01/21/2018  . Vertebral fracture, osteoporotic ([place]) 01/20/2018  . Rectal bleeding 09/05/2017  . Left lower quadrant pain 09/05/2017  . Oxygen dependent 09/05/2017  . Chronic back pain 05/16/2017  . COPD exacerbation ([place]) 02/19/2017  . Tachycardia 02/19/2017  . Influenza A 02/19/2017  . Acute respiratory failure ([place]) 02/19/2017  . Dyspnea 11/26/2014  . Loss of weight 11/26/2014  . Chest pain 08/24/2013  . Encounter for therapeutic drug monitoring 02/13/2013  . Abnormal weight loss 03/21/2012  . Cerebrovascular disease 11/15/2011  . CAD S/P percutaneous coronary angioplasty   . Factor V Leiden, prothrombin gene mutation ([place])   . Hyperlipidemia   . Chronic anticoagulation   . Hypothyroidism 11/23/2006  . TOBACCO ABUSE 11/23/2006  . History of pulmonary embolus (PE) 11/23/2006  . 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 COLONOSCOPY  Approximately 2000   Negative screening study  . COLONOSCOPY WITH PROPOFOL N/A 05/28/2018   Procedure: COLONOSCOPY WITH PROPOFOL;  Surgeon: Rogene Houston, MD;  Location: [facility] ENDO SU[facility];  Service: Endoscopy;  Laterality: N/A;  . CORONARY ANGIOPLASTY  2002, 2003, 2012  . ESOPHAGOGASTRODUODENOSCOPY (EGD) WITH PROPOFOL N/A 05/26/2018   Procedure: ESOPHAGOGASTRODUODENOSCOPY (EGD) WITH PROPOFOL;  Surgeon: Rogene Houston, MD;  Location: [facility] END[facility];  Service: Endoscopy;  Laterality: N/A;  . LEFT AND RIGHT HEART CATHETERIZATION WITH CORONARY ANGIOGRAM N/A 04/03/2011   Procedure:  LEFT AND RIGHT HEART CATHETERIZATION WITH CORONARY ANGIOGRAM;  Surgeon: Sherren Mocha, MD;  Location: [facility] CATH LAB;  Service: Cardiovascular;  Laterality: N/A;  . POLYPECTOMY  05/28/2018   Procedure: POLYPECTOMY;  Surgeon: Rogene Houston, MD;  Location: [facility] ENDO SUITE;  Service: Endoscopy;;  cold snare and biopsy forcep     OB History    Gravida  3   Para  2   Term  2   Preterm      AB  1   Living        SAB      TAB  1   Ectopic      Multiple      Live Births               Home Medications    Prior to Admission medications   Medication Sig Start Date End Date Taking? Authorizing Provider  arformoterol (BROVANA) 15 MCG/2ML NEBU Take 2 mLs (15 mcg total) by nebulization 2 (two) times daily. 10/11/18  Yes Martyn Ehrich, NP  bisacodyl (DULCOLAX) 5 MG EC tablet Take 1 tablet (5 mg total) by mouth daily as needed for mild constipation. 08/12/18  Yes [place] Eric J, DO  bisoprolol (ZEBETA) 5 MG tablet Take 0.5 tablets (2.5 mg total) by mouth daily. 08/12/18 11/01/18 Yes [place] Eric J, DO  buPROPion (WELLBUTRIN) 100 MG tablet Take 0.5 tablets (50 mg total) by mouth 2 (two) times daily. 08/12/18  Yes [place] Eric J, DO  citalopram (CELEXA) 10 MG tablet Take 1 tablet (10 mg total) by mouth daily. 08/12/18  Yes [place] Eric J, DO  Glycerin-Hypromellose-PEG 400 (DRY EYE RELIEF DROPS) 0.2-0.2-1 % SOLN Place 1-2 drops into both eyes 2 (two) times a day.   Yes [provider]  levothyroxine (SYNTHROID) 125 MCG tablet Take 1 tablet (125 mcg total) by mouth daily. 08/12/18  Yes [place] Eric J, DO  Omega-3 Fatty Acids (FISH OIL) 600 MG CAPS Take 1 capsule by mouth daily.    Yes [provider]  pantoprazole (PROTONIX) 40 MG tablet Take 1 tablet (40 mg total) by mouth 2 (two) times daily. 08/12/18  Yes [place] Donnamarie Poag, DO  ticagrelor (BRILINTA) 60 MG TABS tablet Take 1 tablet (60 mg total) by mouth 2 (two) times daily. 08/12/18  Yes [place] Donnamarie Poag, DO  warfarin  (COUMADIN) 5 MG tablet 5mg  on Monday, Wednesday, Friday, Sunday 08/12/18  Yes [place] Donnamarie Poag, DO  warfarin (COUMADIN) 7.5 MG tablet 7.5 mg on Tuesday, Thursday, Saturday 08/12/18  Yes [place] Eric J, DO  albuterol (PROVENTIL) (2.5 MG/3ML) 0.083% nebulizer solution USE 1 VIAL BY NEBULIZER EVERY 4 HOURS AS NEEDED FOR WHEEZING. DX: J44.9 Patient taking differently: Take 2.5 mg by nebulization every 4 (four) hours as needed for wheezing or shortness of breath.  01/24/17   Magdalen Spatz, NP  budesonide (PULMICORT) 0.5 MG/2ML nebulizer solution Take 2 mLs (0.5 mg total) by nebulization 2 (two) times daily. Dx: J43.9 01/24/17   Magdalen Spatz, NP  ipratropium-albuterol (DUONEB) 0.5-2.5 (3) MG/3ML SOLN Take 3 mLs by nebulization 4 (four) times daily. 03/20/17   Magdalen Spatz, NP  lidocaine (LIDODERM)  5 % Place 1 patch onto the skin daily. Remove & Discard patch within 12 hours or as directed by MD Patient not taking: Reported on 11/01/2018 08/13/18   [place] Donnamarie Poag, DO  Maltodextrin-Xanthan Gum ([place]) POWD Use with meals 08/12/18   [place] Donnamarie Poag, DO  nitroGLYCERIN (NITROSTAT) 0.4 MG SL tablet Place 1 tablet (0.4 mg total) under the tongue every 5 (five) minutes as needed for chest pain. 10/28/14   Herminio Commons, MD  polyethylene glycol (MIRALAX / GLYCOLAX) 17 g packet Take 17 g by mouth daily as needed for mild constipation. 08/12/18   [place] Eric J, DO  Respiratory Therapy Supplies (FLUTTER) DEVI Use as directed 05/14/15   Javier Glazier, MD  senna (SENOKOT) 8.6 MG TABS tablet Take 2 tablets (17.2 mg total) by mouth at bedtime. 08/12/18   [place] Eric J, DO    Family History Family History  Problem Relation Age of Onset  . Emphysema Mother   . Alzheimer's disease Mother   . Emphysema Sister   . Heart disease Father   . Factor V Leiden deficiency Father   . Factor V Leiden deficiency Sister   . Factor V Leiden deficiency Daughter   . Kidney cancer Paternal Grandmother      Social History Social History   Tobacco Use  . Smoking status: Former Smoker    Packs/day: 0.50    Years: 50.00    Pack years: 25.00    Types: Cigarettes    Start date: 05/10/1968    Quit date: 07/16/2018    Years since quitting: 0.2  . Smokeless tobacco: Never Used  Substance Use Topics  . Alcohol use: No    Alcohol/week: 0.0 standard drinks  . Drug use: No     Allergies   Dilaudid [hydromorphone hcl], Minocycline hcl, Prednisone, Varenicline tartrate, and Zocor [simvastatin - high dose]   Review of Systems Review of Systems  Unable to perform ROS: Mental status change  Gastrointestinal: Negative for abdominal pain.     Physical Exam Updated Vital Signs BP 131/61   Pulse 94   Temp 99.1 F (37.3 C) (Oral)   Resp 18   Ht 5\' 2"  (1.575 m)   Wt 68 kg   SpO2 100%   BMI 27.42 kg/m   Physical Exam Vitals signs and nursing note reviewed.  Constitutional:      Appearance: She is well-developed.  HENT:     Head: Normocephalic.     Mouth/Throat:     Mouth: Mucous membranes are moist.  Eyes:     General: No scleral icterus.    Conjunctiva/sclera: Conjunctivae normal.  Neck:     Musculoskeletal: Neck supple.     Thyroid: No thyromegaly.  Cardiovascular:     Rate and Rhythm: Normal rate and regular rhythm.     Heart sounds: No murmur. No friction rub. No gallop.   Pulmonary:     Breath sounds: No stridor. No wheezing or rales.  Chest:     Chest wall: No tenderness.  Abdominal:     General: There is no distension.     Tenderness: There is no abdominal tenderness. There is no rebound.  Musculoskeletal: Normal range of motion.  Lymphadenopathy:     Cervical: No cervical adenopathy.  Skin:    Findings: No erythema or rash.  Neurological:     Motor: No abnormal muscle tone.     Coordination: Coordination normal.     Comments: Patient went to person only  Psychiatric:  Comments: Patient confused      ED Treatments / Results  Labs (all labs ordered are  listed, but only abnormal results are displayed) Labs Reviewed  COMPREHENSIVE METABOLIC PANEL - Abnormal; Notable for the following components:      Result Value   CO2 33 (*)    BUN 31 (*)    Calcium 8.7 (*)    All other components within normal limits  CBC - Abnormal; Notable for the following components:   RBC 3.49 (*)    Hemoglobin 10.4 (*)    MCV 103.4 (*)    MCHC 28.8 (*)    All other components within normal limits  BLOOD GAS, ARTERIAL - Abnormal; Notable for the following components:   pH, Arterial 7.232 (*)    pCO2 arterial 98.5 (*)    pO2, Arterial 258 (*)    Bicarbonate 33.8 (*)    Acid-Base Excess 12.2 (*)    All other components within normal limits  CBG MONITORING, ED - Abnormal; Notable for the following components:   Glucose-Capillary 102 (*)    All other components within normal limits  SARS CORONAVIRUS 2 BY RT PCR (HOSPITAL ORDER, [facility])  AMMONIA  ETHANOL  RAPID URINE DRUG SCREEN, HOSP PERFORMED  URINALYSIS, ROUTINE W REFLEX MICROSCOPIC  BRAIN NATRIURETIC PEPTIDE  PROTIME-INR    EKG None  Radiology Dg Chest 2 View  Result Date: 11/01/2018 CLINICAL DATA:  Shortness of breath. EXAM: CHEST - 2 VIEW COMPARISON:  October 14, 2018. FINDINGS: Stable cardiomegaly. Mild central pulmonary vascular congestion is noted. No pneumothorax is noted. Mild pulmonary edema may be present. Small bilateral pleural effusions may be present. Bony thorax is unremarkable. IMPRESSION: Stable cardiomegaly with mild central pulmonary vascular congestion and possible mild pulmonary edema. Small bilateral pleural effusions are noted. Electronically Signed   By: Marijo Conception M.D.   On: 11/01/2018 15:56   Ct Head Wo Contrast  Result Date: 11/01/2018 CLINICAL DATA:  Altered mental status, confusion EXAM: CT HEAD WITHOUT CONTRAST TECHNIQUE: Contiguous axial images were obtained from the base of the skull through the vertex without intravenous contrast.  COMPARISON:  10/14/2018 FINDINGS: Brain: No evidence of acute infarction, hemorrhage, hydrocephalus, extra-axial collection or mass lesion/mass effect. Vascular: No hyperdense vessel or unexpected calcification. Skull: Normal. Negative for fracture or focal lesion. Sinuses/Orbits: No acute finding. Other: None. IMPRESSION: No acute intracranial pathology. Electronically Signed   By: Eddie Candle M.D.   On: 11/01/2018 15:46    Procedures Procedures (including critical care time)  Medications Ordered in ED Medications  magnesium sulfate IVPB 2 g 50 mL (2 g Intravenous New Bag/Given 11/01/18 1714)  sodium chloride 0.9 % bolus 500 mL (0 mLs Intravenous Stopped 11/01/18 1655)  methylPREDNISolone sodium succinate (SOLU-MEDROL) 125 mg/2 mL injection 125 mg (125 mg Intravenous Given 11/01/18 1711)  albuterol (VENTOLIN HFA) 108 (90 Base) MCG/ACT inhaler 4 puff (4 puffs Inhalation Given 11/01/18 1710)     Initial Impression / Assessment and Plan / ED Course  I have reviewed the triage vital signs and the nursing notes.  Pertinent labs & imaging results that were available during my care of the patient were reviewed by me and considered in my medical decision making (see chart for details).        CRITICAL CARE Performed by: Milton Ferguson Total critical care time: 45 minutes Critical care time was exclusive of separately billable procedures and treating other patients. Critical care was necessary to treat or prevent imminent or  life-threatening deterioration. Critical care was time spent personally by me on the following activities: development of treatment plan with patient and/or surrogate as well as nursing, discussions with consultants, evaluation of patient's response to treatment, examination of patient, obtaining history from patient or surrogate, ordering and performing treatments and interventions, ordering and review of laboratory studies, ordering and review of radiographic studies,  pulse oximetry and re-evaluation of patient's condition.  ABG shows significant CO2 retention.  Patient will be admitted for respiratory failure she is put on BiPAP Final Clinical Impressions(s) / ED Diagnoses   Final diagnoses:  CO2 retention    ED Discharge Orders    None       Milton Ferguson, MD 11/01/18 1742

## 2018-11-01 NOTE — H&P (Signed)
History and Physical  Jenniferlynn Hodgdon [id number] DOB: [birth year] DOA: 11/01/2018  Referring physician: Dr Dewayne Hatch, ED physician PCP: [name], NP  Outpatient Specialists:   Patient Coming From: home  Chief Complaint: confused  HPI: [place] is a 68 y.o. female with a history of coronary artery disease with history of MI x4 and stents, factor V Leiden with history of PE on chronic anticoagulation, COPD, chronic systolic heart failure with EF of 30 to 35% on echo in 07/2018, hypothyroidism.  Patient unable to provide history due to confusion.  Patient found acutely confused by EMS.  They were called by friend.  She entered the door without any close and urinated on the floor.  She was brought to the hospital for evaluation.  No palliating or provoking factors.  She has chronic respiratory failure and wears oxygen, which she was not wearing upon EMS arrival.  Emergency Department Course: ABG shows acidosis with hypercarbia.  White count normal.  Chest x-ray shows mild pulmonary edema.  Wheezing on exam.  Patient given steroids and nebulizer treatment and magnesium in the ED.  Review of Systems:  Unable to obtain due to patient's confusion  Past Medical History:  Diagnosis Date  . Acute MI ([place])    x4, code blue x3  . CAD (coronary artery disease) 2002   Inf STEMI-2002. 2003-cutting balloon + brachytherapy for restenosis; subsequent acute stent thrombosis 06/2010 requiring 2 separate interventions (Zeta stent, then repeat cath with thrombectomy). focal basal inf AK, nl EF; 03/2011: Patent stents, minor nonobst  residual dz, nl EF; neg stress nuclear in 2008 and stress echo in 2009  . Chronic anticoagulation    Warfarin plus ticagrelor  . Chronic respiratory failure ([place]) 08/27/2013   On 2L 02  . COPD (chronic obstructive pulmonary disease) ([place])    02 dependent  . Factor 5 Leiden mutation, heterozygous ([place])   . Factor V Leiden, prothrombin gene mutation ([place]) 2006  .  Hyperlipidemia   . Hypothyroidism   . Noncompliance   . Pelvic fracture ([place]) 2009  . Pulmonary embolism ([place]) 2006   Associated with deep vein thrombosis-2006; + factor V Leiden  . Tobacco abuse    50 pack years   Past Surgical History:  Procedure Laterality Date  . COLONOSCOPY  Approximately 2000   Negative screening study  . COLONOSCOPY WITH PROPOFOL N/A 05/28/2018   Procedure: COLONOSCOPY WITH PROPOFOL;  Surgeon: Rogene Houston, MD;  [facility];  Service: Endoscopy;  Laterality: N/A;  . CORONARY ANGIOPLASTY  2002, 2003, 2012  . ESOPHAGOGASTRODUODENOSCOPY (EGD) WITH PROPOFOL N/A 05/26/2018   Procedure: ESOPHAGOGASTRODUODENOSCOPY (EGD) WITH PROPOFOL;  Surgeon: Rogene Houston, MD;  [facility];  Service: Endoscopy;  Laterality: N/A;  . LEFT AND RIGHT HEART CATHETERIZATION WITH CORONARY ANGIOGRAM N/A 04/03/2011   Procedure: LEFT AND RIGHT HEART CATHETERIZATION WITH CORONARY ANGIOGRAM;  Surgeon: Sherren Mocha, MD;  Location: [facility] CATH LAB;  Service: Cardiovascular;  Laterality: N/A;  . POLYPECTOMY  05/28/2018   Procedure: POLYPECTOMY;  Surgeon: Rogene Houston, MD;  Location: [facility];  Service: Endoscopy;;  cold snare and biopsy forcep   Social History:  reports that she quit smoking about 3 months ago. Her smoking use included cigarettes. She started smoking about 50 years ago. She has a 25.00 pack-year smoking history. She has never used smokeless tobacco. She reports that she does not drink alcohol or use drugs. Patient lives at home  Allergies  Allergen Reactions  . Dilaudid [Hydromorphone Hcl]  Hives and Nausea Only  . Minocycline Hcl     REACTION: Dizzy  . Prednisone     REACTION: feels like throat swelling, hallucinations  . Varenicline Tartrate     REACTION: Dizzy(chantix)   . Zocor [Simvastatin - High Dose] Other (See Comments)    myalgia    Family History  Problem Relation Age of Onset  . Emphysema Mother   . Alzheimer's disease Mother    . Emphysema Sister   . Heart disease Father   . Factor V Leiden deficiency Father   . Factor V Leiden deficiency Sister   . Factor V Leiden deficiency Daughter   . Kidney cancer Paternal Grandmother     Prior to Admission medications   Medication Sig Start Date End Date Taking? Authorizing Provider  arformoterol (BROVANA) 15 MCG/2ML NEBU Take 2 mLs (15 mcg total) by nebulization 2 (two) times daily. 10/11/18  Yes Martyn Ehrich, NP  bisacodyl (DULCOLAX) 5 MG EC tablet Take 1 tablet (5 mg total) by mouth daily as needed for mild constipation. 08/12/18  Yes [place] Eric J, DO  bisoprolol (ZEBETA) 5 MG tablet Take 0.5 tablets (2.5 mg total) by mouth daily. 08/12/18 11/01/18 Yes [place] Eric J, DO  buPROPion (WELLBUTRIN) 100 MG tablet Take 0.5 tablets (50 mg total) by mouth 2 (two) times daily. 08/12/18  Yes [place] Eric J, DO  citalopram (CELEXA) 10 MG tablet Take 1 tablet (10 mg total) by mouth daily. 08/12/18  Yes [place] Eric J, DO  Glycerin-Hypromellose-PEG 400 (DRY EYE RELIEF DROPS) 0.2-0.2-1 % SOLN Place 1-2 drops into both eyes 2 (two) times a day.   Yes [provider]  levothyroxine (SYNTHROID) 125 MCG tablet Take 1 tablet (125 mcg total) by mouth daily. 08/12/18  Yes [place] Eric J, DO  Omega-3 Fatty Acids (FISH OIL) 600 MG CAPS Take 1 capsule by mouth daily.    Yes [provider]  pantoprazole (PROTONIX) 40 MG tablet Take 1 tablet (40 mg total) by mouth 2 (two) times daily. 08/12/18  Yes [place] Donnamarie Poag, DO  ticagrelor (BRILINTA) 60 MG TABS tablet Take 1 tablet (60 mg total) by mouth 2 (two) times daily. 08/12/18  Yes [place] Donnamarie Poag, DO  warfarin (COUMADIN) 5 MG tablet 5mg  on Monday, Wednesday, Friday, Sunday 08/12/18  Yes [place] Donnamarie Poag, DO  warfarin (COUMADIN) 7.5 MG tablet 7.5 mg on Tuesday, Thursday, Saturday 08/12/18  Yes [place] Eric J, DO  albuterol (PROVENTIL) (2.5 MG/3ML) 0.083% nebulizer solution USE 1 VIAL BY NEBULIZER EVERY 4 HOURS AS NEEDED FOR WHEEZING. DX:  J44.9 Patient taking differently: Take 2.5 mg by nebulization every 4 (four) hours as needed for wheezing or shortness of breath.  01/24/17   Magdalen Spatz, NP  budesonide (PULMICORT) 0.5 MG/2ML nebulizer solution Take 2 mLs (0.5 mg total) by nebulization 2 (two) times daily. Dx: J43.9 01/24/17   Magdalen Spatz, NP  ipratropium-albuterol (DUONEB) 0.5-2.5 (3) MG/3ML SOLN Take 3 mLs by nebulization 4 (four) times daily. 03/20/17   Magdalen Spatz, NP  lidocaine (LIDODERM) 5 % Place 1 patch onto the skin daily. Remove & Discard patch within 12 hours or as directed by MD Patient not taking: Reported on 11/01/2018 08/13/18   [place] Donnamarie Poag, DO  Maltodextrin-Xanthan Gum ([place]) POWD Use with meals 08/12/18   [place] Donnamarie Poag, DO  nitroGLYCERIN (NITROSTAT) 0.4 MG SL tablet Place 1 tablet (0.4 mg total) under the tongue every 5 (five) minutes as needed for chest pain. 10/28/14  Herminio Commons, MD  polyethylene glycol (MIRALAX / GLYCOLAX) 17 g packet Take 17 g by mouth daily as needed for mild constipation. 08/12/18   [place] Eric J, DO  Respiratory Therapy Supplies (FLUTTER) DEVI Use as directed 05/14/15   Javier Glazier, MD  senna (SENOKOT) 8.6 MG TABS tablet Take 2 tablets (17.2 mg total) by mouth at bedtime. 08/12/18   [place] Eric J, DO    Physical Exam: BP (!) 132/58   Pulse 85   Temp 99.1 F (37.3 C) (Oral)   Resp 17   Ht 5\' 2"  (1.575 m)   Wt 68 kg   SpO2 100%   BMI 27.42 kg/m   . General: Elderly female. Awake and alert and oriented x3. No acute cardiopulmonary distress.  [name] HEENT: Normocephalic atraumatic.  Right and left ears normal in appearance.  Pupils equal, round, reactive to light. Extraocular muscles are intact. Sclerae anicteric and noninjected.  Moist mucosal membranes. No mucosal lesions.  . Neck: Neck supple without lymphadenopathy. No carotid bruits. No masses palpated.  . Cardiovascular: Regular rate with normal S1-S2 sounds. No murmurs, rubs, gallops  auscultated. No JVD.  [name] Respiratory: Mild wheeze throughout.  No rales.  No accessory muscle use. . Abdomen: Soft, nontender, nondistended. Active bowel sounds. No masses or hepatosplenomegaly  . Skin: No rashes, lesions, or ulcerations.  Dry, warm to touch. 2+ dorsalis pedis and radial pulses. . Musculoskeletal: No calf or leg pain. All major joints not erythematous nontender.  No upper or lower joint deformation.  Good ROM.  No contractures  . Psychiatric: Intact judgment and insight. Pleasant and cooperative. . Neurologic: No focal neurological deficits. Strength is 5/5 and symmetric in upper and lower extremities.  Cranial nerves II through XII are grossly intact.           Labs on Admission: I have personally reviewed following labs and imaging studies  CBC: Recent Labs  Lab 11/01/18 1455  WBC 5.4  HGB 10.4*  HCT 36.1  MCV 103.4*  PLT [id number]   Basic Metabolic Panel: Recent Labs  Lab 11/01/18 1455  NA 143  K 4.1  CL 98  CO2 33*  GLUCOSE 99  BUN 31*  CREATININE 0.68  CALCIUM 8.7*   GFR: Estimated Creatinine Clearance: 60.9 mL/min (by C-G formula based on SCr of 0.68 mg/dL). Liver Function Tests: Recent Labs  Lab 11/01/18 1455  AST 17  ALT 13  ALKPHOS 93  BILITOT 0.5  PROT 6.6  ALBUMIN 3.5   No results for input(s): LIPASE, AMYLASE in the last 168 hours. Recent Labs  Lab 11/01/18 1600  AMMONIA 18   Coagulation Profile: Recent Labs  Lab 11/01/18 1740  INR 2.8*   Cardiac Enzymes: No results for input(s): CKTOTAL, CKMB, CKMBINDEX, TROPONINI in the last 168 hours. BNP (last 3 results) No results for input(s): PROBNP in the last 8760 hours. HbA1C: No results for input(s): HGBA1C in the last 72 hours. CBG: Recent Labs  Lab 11/01/18 1458  GLUCAP 102*   Lipid Profile: No results for input(s): CHOL, HDL, LDLCALC, TRIG, CHOLHDL, LDLDIRECT in the last 72 hours. Thyroid Function Tests: No results for input(s): TSH, T4TOTAL, FREET4, T3FREE, THYROIDAB in  the last 72 hours. Anemia Panel: No results for input(s): VITAMINB12, FOLATE, FERRITIN, TIBC, IRON, RETICCTPCT in the last 72 hours. Urine analysis:    Component Value Date/Time   COLORURINE YELLOW 05/23/2018 [place] 05/23/2018 0934   LABSPEC 1.014 05/23/2018 [place] 7.0 05/23/2018 0934  GLUCOSEU NEGATIVE 05/23/2018 0934   HGBUR SMALL (A) 05/23/2018 0934   BILIRUBINUR NEGATIVE 05/23/2018 0934   BILIRUBINUR n 09/21/2016 1432   KETONESUR NEGATIVE 05/23/2018 0934   PROTEINUR 30 (A) 05/23/2018 0934   UROBILINOGEN 0.2 09/21/2016 1432   UROBILINOGEN 0.2 04/13/2009 2025   NITRITE NEGATIVE 05/23/2018 0934   LEUKOCYTESUR NEGATIVE 05/23/2018 0934   Sepsis Labs: @LABRCNTIP (procalcitonin:4,lacticidven:4) ) Recent Results (from the past 240 hour(s))  SARS Coronavirus 2 by RT PCR (hospital order, performed in [facility] hospital lab) Nasopharyngeal Nasopharyngeal Swab     Status: None   Collection Time: 11/01/18  3:24 PM   Specimen: Nasopharyngeal Swab  Result Value Ref Range Status   SARS Coronavirus 2 NEGATIVE NEGATIVE Final    Comment: (NOTE) If result is NEGATIVE SARS-CoV-2 target nucleic acids are NOT DETECTED. The SARS-CoV-2 RNA is generally detectable in upper and lower  respiratory specimens during the acute phase of infection. The lowest  concentration of SARS-CoV-2 viral copies this assay can detect is 250  copies / mL. A negative result does not preclude SARS-CoV-2 infection  and should not be used as the sole basis for treatment or other  patient management decisions.  A negative result may occur with  improper specimen collection / handling, submission of specimen other  than nasopharyngeal swab, presence of viral mutation(s) within the  areas targeted by this assay, and inadequate number of viral copies  (<250 copies / mL). A negative result must be combined with clinical  observations, patient history, and epidemiological information. If result  is POSITIVE SARS-CoV-2 target nucleic acids are DETECTED. The SARS-CoV-2 RNA is generally detectable in upper and lower  respiratory specimens dur ing the acute phase of infection.  Positive  results are indicative of active infection with SARS-CoV-2.  Clinical  correlation with patient history and other diagnostic information is  necessary to determine patient infection status.  Positive results do  not rule out bacterial infection or co-infection with other viruses. If result is PRESUMPTIVE POSTIVE SARS-CoV-2 nucleic acids MAY BE PRESENT.   A presumptive positive result was obtained on the submitted specimen  and confirmed on repeat testing.  While 2019 novel coronavirus  (SARS-CoV-2) nucleic acids may be present in the submitted sample  additional confirmatory testing may be necessary for epidemiological  and / or clinical management purposes  to differentiate between  SARS-CoV-2 and other Sarbecovirus currently known to infect humans.  If clinically indicated additional testing with an alternate test  methodology [phone]) is advised. The SARS-CoV-2 RNA is generally  detectable in upper and lower respiratory sp ecimens during the acute  phase of infection. The expected result is Negative. Fact Sheet for Patients:  StrictlyIdeas.no Fact Sheet for Healthcare Providers: BankingDealers.co.za This test is not yet approved or cleared by the [place] FDA and has been authorized for detection and/or diagnosis of SARS-CoV-2 by FDA under an Emergency Use Authorization (EUA).  This EUA will remain in effect (meaning this test can be used) for the duration of the COVID-19 declaration under Section 564(b)(1) of the Act, 21 U.S.C. section 360bbb-3(b)(1), unless the authorization is terminated or revoked sooner. Performed at [facility], [address] [place], [place] [address]      Radiological Exams on Admission: Dg Chest 2 View   Result Date: 11/01/2018 CLINICAL DATA:  Shortness of breath. EXAM: CHEST - 2 VIEW COMPARISON:  October 14, 2018. FINDINGS: Stable cardiomegaly. Mild central pulmonary vascular congestion is noted. No pneumothorax is noted. Mild pulmonary edema may be present.  Small bilateral pleural effusions may be present. Bony thorax is unremarkable. IMPRESSION: Stable cardiomegaly with mild central pulmonary vascular congestion and possible mild pulmonary edema. Small bilateral pleural effusions are noted. Electronically Signed   By: Marijo Conception M.D.   On: 11/01/2018 15:56   Ct Head Wo Contrast  Result Date: 11/01/2018 CLINICAL DATA:  Altered mental status, confusion EXAM: CT HEAD WITHOUT CONTRAST TECHNIQUE: Contiguous axial images were obtained from the base of the skull through the vertex without intravenous contrast. COMPARISON:  10/14/2018 FINDINGS: Brain: No evidence of acute infarction, hemorrhage, hydrocephalus, extra-axial collection or mass lesion/mass effect. Vascular: No hyperdense vessel or unexpected calcification. Skull: Normal. Negative for fracture or focal lesion. Sinuses/Orbits: No acute finding. Other: None. IMPRESSION: No acute intracranial pathology. Electronically Signed   By: Eddie Candle M.D.   On: 11/01/2018 15:46     Assessment/Plan: Principal Problem:   Acute metabolic encephalopathy Active Problems:   Hypothyroidism   History of pulmonary embolus (PE)   Factor V Leiden, prothrombin gene mutation (HCC)   COPD exacerbation (HCC)   Acute on chronic respiratory failure with hypoxia and hypercapnia (HCC)   Acute on chronic systolic CHF (congestive heart failure) ([place])    This patient was discussed with the ED physician, including pertinent vitals, physical exam findings, labs, and imaging.  We also discussed care given by the ED provider.  1. Acute metabolic encephalopathy a. Admit b. Bipap for hypercarbia - will be cautious about shutting down respiratory drive c. R/o UTI  d. Hold sedating medications 2. Acute on chronic respiratory failure with hypoxia and hypercapnia a.  3. COPD exacerbation Antibiotics: none DuoNeb's every 6 scheduled with albuterol every 2 when necessary Continue inhaled steroids and LA bronchodilator Solu-Medrol 60 mg IV every 6 hours Mucinex 4. Acute on Chronic systolic heart failure Telemetry monitoring Strict I/O Daily Weights Diuresis: lasix Potassium: 20 mEq twice a day by mouth Echo cardiac exam tomorrow Repeat BMP tomorrow 5. Hypothyroidism a. Continue thyroid b. Check TSH 6. Factor 5 leiden a. coumadin  DVT prophylaxis: therapeutic anticoagulation Consultants: none Code Status: full code presumed in accordance to last code on file Family Communication: none  Disposition Plan: pending   Truett Mainland, DO

## 2018-11-01 NOTE — ED Notes (Signed)
Patient transported to CT 

## 2018-11-01 NOTE — ED Notes (Signed)
Patient still appears confused, having a large amount of PVC on monitor. On BiPAP still change  To 14/6 f 89 decreased oxygen to 35, saturation 97.  Hr on monitor at times does not agree with Pulse. Probably will change to xopenex if PVC"s due not improve. PCO2 high 98 -- confusion ?? Nurse aware of PVC"s.

## 2018-11-01 NOTE — ED Notes (Signed)
Pt returned to room  

## 2018-11-01 NOTE — ED Triage Notes (Signed)
Ems was called out to pt's residence by home health Nurse practitioner who was visiting pt today, NP reported to ems that pt greeted her at the door with her pants down, had urinated on herself, pt confused, was not wearing her oxygen, pt able to state who she is and her birthday upon arrival to er, does not know what happened or the date, ems reports that pt's house was very unkept and cluttered,

## 2018-11-02 ENCOUNTER — Inpatient Hospital Stay (HOSPITAL_COMMUNITY): Payer: Medicare Other

## 2018-11-02 ENCOUNTER — Other Ambulatory Visit: Payer: Self-pay

## 2018-11-02 DIAGNOSIS — I5021 Acute systolic (congestive) heart failure: Secondary | ICD-10-CM

## 2018-11-02 LAB — BASIC METABOLIC PANEL
Anion gap: 10 (ref 5–15)
BUN: 32 mg/dL — ABNORMAL HIGH (ref 8–23)
CO2: 37 mmol/L — ABNORMAL HIGH (ref 22–32)
Calcium: 8.3 mg/dL — ABNORMAL LOW (ref 8.9–10.3)
Chloride: 98 mmol/L (ref 98–111)
Creatinine, Ser: 0.69 mg/dL (ref 0.44–1.00)
GFR calc Af Amer: >60 mL/min (ref >60)
GFR calc non Af Amer: >60 mL/min (ref >60)
Glucose, Bld: 136 mg/dL — ABNORMAL HIGH (ref 70–99)
Potassium: 4 mmol/L (ref 3.5–5.1)
Sodium: 145 mmol/L (ref 135–145)

## 2018-11-02 LAB — ECHOCARDIOGRAM COMPLETE
Height: 62 in
Weight: 2398.6 oz

## 2018-11-02 LAB — PROTIME-INR
INR: 2.7 — ABNORMAL HIGH (ref 0.8–1.2)
Prothrombin Time: 28.3 seconds — ABNORMAL HIGH (ref 11.4–15.2)

## 2018-11-02 LAB — HIV ANTIBODY (ROUTINE TESTING W REFLEX): HIV Screen 4th Generation wRfx: NONREACTIVE

## 2018-11-02 MED ORDER — NICOTINE 14 MG/24HR TD PT24
14.0000 mg | MEDICATED_PATCH | Freq: Every day | TRANSDERMAL | Status: DC
  Filled 2018-11-02 (×3): qty 1

## 2018-11-02 MED ORDER — CEFDINIR 300 MG PO CAPS
300.0000 mg | ORAL_CAPSULE | Freq: Two times a day (BID) | ORAL | Status: DC
  Administered 2018-11-02 – 2018-11-05 (×6): 300 mg via ORAL
  Filled 2018-11-02 (×6): qty 1

## 2018-11-02 MED ORDER — METHYLPREDNISOLONE SODIUM SUCC 40 MG IJ SOLR
40.0000 mg | Freq: Three times a day (TID) | INTRAMUSCULAR | Status: DC
  Administered 2018-11-03 (×2): 40 mg via INTRAVENOUS
  Filled 2018-11-02 (×3): qty 1

## 2018-11-02 MED ORDER — GUAIFENESIN ER 600 MG PO TB12
600.0000 mg | ORAL_TABLET | Freq: Two times a day (BID) | ORAL | Status: DC
  Administered 2018-11-02 – 2018-11-05 (×6): 600 mg via ORAL
  Filled 2018-11-02 (×6): qty 1

## 2018-11-02 MED ORDER — WARFARIN SODIUM 7.5 MG PO TABS
7.5000 mg | ORAL_TABLET | Freq: Once | ORAL | Status: AC
  Administered 2018-11-02: 7.5 mg via ORAL
  Filled 2018-11-02 (×2): qty 1

## 2018-11-02 NOTE — Progress Notes (Signed)
ANTICOAGULATION CONSULT NOTE -   Pharmacy Consult for Warfarin Indication: History of PE/DVT, Factor V Leiden mutation  Allergies  Allergen Reactions  . Dilaudid [Hydromorphone Hcl] Hives and Nausea Only  . Minocycline Hcl     REACTION: Dizzy  . Prednisone     REACTION: feels like throat swelling, hallucinations  . Varenicline Tartrate     REACTION: Dizzy(chantix)   . Zocor [Simvastatin - High Dose] Other (See Comments)    myalgia    Patient Measurements: Height: 5\' 2"  (157.5 cm) Weight: 149 lb 14.6 oz (68 kg) IBW/kg (Calculated) : 50.1  Vital Signs: BP: 113/48 (10/24 0830) Pulse Rate: 77 (10/24 0830)  Labs: Recent Labs    11/01/18 1455 11/01/18 1740 11/02/18 0601  HGB 10.4*  --   --   HCT 36.1  --   --   PLT 199  --   --   LABPROT  --  29.3* 28.3*  INR  --  2.8* 2.7*  CREATININE 0.68  --  0.69    Estimated Creatinine Clearance: 60.9 mL/min (by C-G formula based on SCr of 0.69 mg/dL).   Medical History: Past Medical History:  Diagnosis Date  . Acute MI ([place])    x4, code blue x3  . CAD (coronary artery disease) 2002   Inf STEMI-2002. 2003-cutting balloon + brachytherapy for restenosis; subsequent acute stent thrombosis 06/2010 requiring 2 separate interventions (Zeta stent, then repeat cath with thrombectomy). focal basal inf AK, nl EF; 03/2011: Patent stents, minor nonobst  residual dz, nl EF; neg stress nuclear in 2008 and stress echo in 2009  . Chronic anticoagulation    Warfarin plus ticagrelor  . Chronic respiratory failure ([place]) 08/27/2013   On 2L 02  . COPD (chronic obstructive pulmonary disease) ([place])    02 dependent  . Factor 5 Leiden mutation, heterozygous ([place])   . Factor V Leiden, prothrombin gene mutation ([place]) 2006  . Hyperlipidemia   . Hypothyroidism   . Noncompliance   . Pelvic fracture ([place]) 2009  . Pulmonary embolism ([place]) 2006   Associated with deep vein thrombosis-2006; + factor V Leiden  . Tobacco abuse    50 pack years     Medications:  Scheduled:  . arformoterol  15 mcg Nebulization BID  . bisoprolol  2.5 mg Oral Daily  . budesonide  0.5 mg Nebulization BID  . buPROPion  50 mg Oral BID  . citalopram  10 mg Oral Daily  . furosemide  40 mg Intravenous Q12H  . ipratropium-albuterol  3 mL Nebulization Q6H  . levothyroxine  125 mcg Oral Q0600  . methylPREDNISolone (SOLU-MEDROL) injection  60 mg Intravenous Q6H  . pantoprazole  40 mg Oral BID  . polyvinyl alcohol  1-2 drop Both Eyes BID  . potassium chloride  40 mEq Oral BID  . senna  2 tablet Oral QHS  . ticagrelor  60 mg Oral BID  . Warfarin - Pharmacist Dosing Inpatient   Does not apply q1800   Infusions:  . cefTRIAXone (ROCEPHIN)  IV 1 g (11/01/18 2311)   PRN: bisacodyl, LORazepam, polyethylene glycol, Resource ThickenUp Clear  Assessment: 68 yo female with factor V Leiden with history of PE on chronic anticoagulation with warfarin.  Pharmacy consulted to continue warfarin dosing while inpatient.  INR is therapeutic at 2.7  home dose reported as 5mg  MWFSu, 7.5mg  TThSa.   Goal of Therapy:  INR 2-3  Monitor platelets per protocol   Plan:  Warfarin 7.5 mg x 1 dose. Monitor daily INR and  s/s of bleeding.  Margot Ables, PharmD Clinical Pharmacist 11/02/2018 10:01 AM

## 2018-11-02 NOTE — Progress Notes (Signed)
Patient Demographics:    [name], is a 68 y.o. female, DOB - 03-14-1950, [id number]  Admit date - 11/01/2018   Admitting Physician Jani Gravel, MD  Outpatient Primary MD for the patient is [name] Peng, NP  LOS - 1   Chief Complaint  Patient presents with   Altered Mental Status        Subjective:    [name] today has no fevers, no emesis,  No chest pain,   -Cough and shortness of breath persist  Assessment  & Plan :    Principal Problem:   Acute metabolic encephalopathy Active Problems:   Hypothyroidism   History of pulmonary embolus (PE)   Factor V Leiden, prothrombin gene mutation (HCC)   COPD exacerbation ([place])   Acute on chronic respiratory failure with hypoxia and hypercapnia (HCC)   Acute on chronic systolic CHF (congestive heart failure) ([place])  Brief Summary  68 y.o. female with a history of coronary artery disease with history of MI x4 and stents, factor V Leiden with history of PE on chronic anticoagulation, COPD chronic hypoxic respiratory failure, HFpEF and  hypothyroidism admitted on 11/01/2018 with confusion/altered mentation in the setting of noncompliance with home O2   A/p 1) acute metabolic encephalopathy secondary to hypercapnic respiratory failure--PCO2 was 98.5 and the pH was 7.23 on admission -Improved with BiPAP -UDS negative -COVID-19 negative -Serum ammonia was 18  2)HFpEF---  echo from 11/02/2018 with EF of 50 to 55% with grade 2 diastolic dysfunction basal inferolateral segment is abnormal/hypokinetic -Please note that EF was 35 to 40% on echo from 07/26/2018 -Continue Lasix and bisoprolol  3) acute on chronic hypoxic and hypercapnic respiratory failure--- admitted with hypercapnia and respiratory acidosis - improved with BiPAP  4) acute COPD exacerbation and tobacco abuse--- this is contributing to #1 and #3 above, smoking cessation  advised, -Give Omnicef, change IV Solu-Medrol to 40 mg every 8 hours -Give nicotine patch, continue mucolytics and bronchodilators  5)Factor V [place] Deficiency with history of prior pulmonary embolism --- continue Coumadin therapy, INR is therapeutic  6)CAD with prior MI and prior angioplasty--continue Brilinta, patient also on Coumadin for #5 above, no evidence of bleeding -Continue bisoprolol 2.5 mg daily  7)Hypothyroidism-stable, continue levothyroxine 125 mcg daily  8) depression and anxiety--- stable, continue lorazepam as needed anxiety, continue Celexa 10 mg daily along with Wellbutrin 50 mg twice daily  Disposition/Need for in-Hospital Stay- patient unable to be discharged at this time due to -acute on chronic hypoxic and hypercapnic respiratory failure requiring BiPAP  Code Status : Full  Family Communication:   NA (patient is alert, awake and coherent)   Disposition Plan  : home  Consults  :  na  DVT Prophylaxis  : Coumadin therapy  Lab Results  Component Value Date   PLT 199 11/01/2018    Inpatient Medications  Scheduled Meds:  arformoterol  15 mcg Nebulization BID   bisoprolol  2.5 mg Oral Daily   budesonide  0.5 mg Nebulization BID   buPROPion  50 mg  Oral BID   citalopram  10 mg Oral Daily   furosemide  40 mg Intravenous Q12H   ipratropium-albuterol  3 mL Nebulization Q6H   levothyroxine  125 mcg Oral Q0600   methylPREDNISolone (SOLU-MEDROL) injection  60 mg Intravenous Q6H   pantoprazole  40 mg Oral BID   polyvinyl alcohol  1-2 drop Both Eyes BID   potassium chloride  40 mEq Oral BID   senna  2 tablet Oral QHS   ticagrelor  60 mg Oral BID   warfarin  7.5 mg Oral Once   Warfarin - Pharmacist Dosing Inpatient   Does not apply q1800   Continuous Infusions:  cefTRIAXone (ROCEPHIN)  IV 1 g (11/01/18 2311)   PRN Meds:.bisacodyl, LORazepam, polyethylene glycol, Resource ThickenUp Clear    Anti-infectives (From admission, onward)    Start     Dose/Rate Route Frequency Ordered Stop   11/01/18 2100  cefTRIAXone (ROCEPHIN) 1 g in sodium chloride 0.9 % 100 mL IVPB     1 g 200 mL/hr over 30 Minutes Intravenous Every 24 hours 11/01/18 1945          Objective:   Vitals:   11/02/18 1351 11/02/18 1527 11/02/18 1528 11/02/18 1617  BP:  (!) 113/50  (!) 102/37  Pulse: 72  72 78  Resp: (!) 21   20  Temp:    98.8 F (37.1 C)  TempSrc:    Oral  SpO2: 99%  94% 100%  Weight:    66 kg  Height:    5\' 4"  (1.626 m)    Wt Readings from Last 3 Encounters:  11/02/18 66 kg  08/12/18 68.7 kg  07/07/18 70.1 kg     Intake/Output Summary (Last 24 hours) at 11/02/2018 1656 Last data filed at 11/02/2018 1527 Gross per 24 hour  Intake 49.97 ml  Output 1000 ml  Net -950.03 ml     Physical Exam  Gen:- Awake Alert, able to speak in short sentences HEENT:- [place].AT, No sclera icterus Nose- [place] 2 L/min Neck-Supple Neck,No JVD,.  Lungs-diminished in bases with scattered wheezes bilaterally CV- S1, S2 normal, regular  Abd-  +ve B.Sounds, Abd Soft, No tenderness,    Extremity/Skin:- No  edema pedal pulses present  Psych-affect is appropriate, oriented x3 Neuro-no new focal deficits, no tremors   Data Review:   Micro Results Recent Results (from the past 240 hour(s))  SARS Coronavirus 2 by RT PCR (hospital order, performed in [facility] hospital lab) Nasopharyngeal Nasopharyngeal Swab     Status: None   Collection Time: 11/01/18  3:24 PM   Specimen: Nasopharyngeal Swab  Result Value Ref Range Status   SARS Coronavirus 2 NEGATIVE NEGATIVE Final    Comment: (NOTE) If result is NEGATIVE SARS-CoV-2 target nucleic acids are NOT DETECTED. The SARS-CoV-2 RNA is generally detectable in upper and lower  respiratory specimens during the acute phase of infection. The lowest  concentration of SARS-CoV-2 viral copies this assay can detect is 250  copies / mL. A negative result does not preclude SARS-CoV-2 infection  and should not be  used as the sole basis for treatment or other  patient management decisions.  A negative result may occur with  improper specimen collection / handling, submission of specimen other  than nasopharyngeal swab, presence of viral mutation(s) within the  areas targeted by this assay, and inadequate number of viral copies  (<250 copies / mL). A negative result must be combined with clinical  observations, patient history, and epidemiological information. If result  is POSITIVE SARS-CoV-2 target nucleic acids are DETECTED. The SARS-CoV-2 RNA is generally detectable in upper and lower  respiratory specimens dur ing the acute phase of infection.  Positive  results are indicative of active infection with SARS-CoV-2.  Clinical  correlation with patient history and other diagnostic information is  necessary to determine patient infection status.  Positive results do  not rule out bacterial infection or co-infection with other viruses. If result is PRESUMPTIVE POSTIVE SARS-CoV-2 nucleic acids MAY BE PRESENT.   A presumptive positive result was obtained on the submitted specimen  and confirmed on repeat testing.  While 2019 novel coronavirus  (SARS-CoV-2) nucleic acids may be present in the submitted sample  additional confirmatory testing may be necessary for epidemiological  and / or clinical management purposes  to differentiate between  SARS-CoV-2 and other Sarbecovirus currently known to infect humans.  If clinically indicated additional testing with an alternate test  methodology [phone]) is advised. The SARS-CoV-2 RNA is generally  detectable in upper and lower respiratory sp ecimens during the acute  phase of infection. The expected result is Negative. Fact Sheet for Patients:  StrictlyIdeas.no Fact Sheet for Healthcare Providers: BankingDealers.co.za This test is not yet approved or cleared by the [place] FDA and has been authorized  for detection and/or diagnosis of SARS-CoV-2 by FDA under an Emergency Use Authorization (EUA).  This EUA will remain in effect (meaning this test can be used) for the duration of the COVID-19 declaration under Section 564(b)(1) of the Act, 21 U.S.C. section 360bbb-3(b)(1), unless the authorization is terminated or revoked sooner. Performed at [facility], [address] [place], [place] [address]     Radiology Reports Dg Chest 2 View  Result Date: 11/01/2018 CLINICAL DATA:  Shortness of breath. EXAM: CHEST - 2 VIEW COMPARISON:  October 14, 2018. FINDINGS: Stable cardiomegaly. Mild central pulmonary vascular congestion is noted. No pneumothorax is noted. Mild pulmonary edema may be present. Small bilateral pleural effusions may be present. Bony thorax is unremarkable. IMPRESSION: Stable cardiomegaly with mild central pulmonary vascular congestion and possible mild pulmonary edema. Small bilateral pleural effusions are noted. Electronically Signed   By: Marijo Conception M.D.   On: 11/01/2018 15:56   Ct Head Wo Contrast  Result Date: 11/01/2018 CLINICAL DATA:  Altered mental status, confusion EXAM: CT HEAD WITHOUT CONTRAST TECHNIQUE: Contiguous axial images were obtained from the base of the skull through the vertex without intravenous contrast. COMPARISON:  10/14/2018 FINDINGS: Brain: No evidence of acute infarction, hemorrhage, hydrocephalus, extra-axial collection or mass lesion/mass effect. Vascular: No hyperdense vessel or unexpected calcification. Skull: Normal. Negative for fracture or focal lesion. Sinuses/Orbits: No acute finding. Other: None. IMPRESSION: No acute intracranial pathology. Electronically Signed   By: Eddie Candle M.D.   On: 11/01/2018 15:46   Ct Head Wo Contrast  Result Date: 10/14/2018 CLINICAL DATA:  Altered level consciousness, EXAM: CT HEAD WITHOUT CONTRAST TECHNIQUE: Contiguous axial images were obtained from the base of the skull through the vertex without  intravenous contrast. COMPARISON:  None. FINDINGS: Brain: No evidence of acute territorial infarction, hemorrhage, hydrocephalus,extra-axial collection or mass lesion/mass effect. Normal gray-white differentiation. Ventricles are normal in size and contour. Vascular: No hyperdense vessel or unexpected calcification. Skull: The skull is intact. No fracture or focal lesion identified. Sinuses/Orbits: The visualized paranasal sinuses and mastoid air cells are clear. The orbits and globes intact. Other: None IMPRESSION: No acute intracranial abnormality. Electronically Signed   By: Prudencio Pair M.D.   On: 10/14/2018 19:08  Ct Angio Chest Pe W/cm &/or Wo Cm  Result Date: 10/14/2018 CLINICAL DATA:  Shortness of breath and altered level consciousness EXAM: CT ANGIOGRAPHY CHEST WITH CONTRAST TECHNIQUE: Multidetector CT imaging of the chest was performed using the standard protocol during bolus administration of intravenous contrast. Multiplanar CT image reconstructions and MIPs were obtained to evaluate the vascular anatomy. CONTRAST:  177mL OMNIPAQUE IOHEXOL 350 MG/ML SOLN COMPARISON:  November 14, 2017 FINDINGS: Cardiovascular: There is a optimal opacification of the pulmonary arteries. There is no central,segmental, or subsegmental filling defects within the pulmonary arteries. The heart is normal in size. No pericardial effusion thickening. No evidence right heart strain. There is a normal 3 vessel brachiocephalic arch. Coronary artery and aortic atherosclerosis seen. No aneurysmal dilatation the intrathoracic aorta. Mediastinum/Nodes: No hilar, mediastinal, or axillary adenopathy. Thyroid gland, trachea, and esophagus demonstrate no significant findings. Lungs/Pleura: Extensive centrilobular emphysematous changes are seen again in both upper lung. There is pleural thickening and streaky scarring seen at the left lung base as on the prior exam. Focal area of subpleural nodularity seen at the posterior right lower  lung. The lungs are otherwise clear. Upper Abdomen: No acute abnormalities present in the visualized portions of the upper abdomen. Musculoskeletal: No chest wall abnormality. No acute or significant osseous findings. Review of the MIP images confirms the above findings. IMPRESSION: No central, segmental, or subsegmental pulmonary embolism. Extensive centrilobular emphysematous changes throughout both lungs with scarring and pleural thickening at the left lung base. Tiny non-specific focal subpleural nodularity in the posterior right lower lung, likely focal area of atelectasis. Electronically Signed   By: Prudencio Pair M.D.   On: 10/14/2018 19:12   Dg Chest Port 1 View  Result Date: 10/14/2018 CLINICAL DATA:  Cough and shortness of breath. EXAM: PORTABLE CHEST 1 VIEW COMPARISON:  08/01/2018 FINDINGS: The heart is mildly enlarged but stable. There is mild tortuosity and calcification of the thoracic aorta. Underlying emphysematous changes are noted. Superimposed peribronchial thickening and increased interstitial markings may suggest superimposed interstitial edema. Possible small effusions. No definite infiltrates. IMPRESSION: 1. Underlying emphysematous changes. 2. Suspect superimposed interstitial pulmonary edema and small effusions. Electronically Signed   By: Marijo Sanes M.D.   On: 10/14/2018 14:49     CBC Recent Labs  Lab 11/01/18 1455  WBC 5.4  HGB 10.4*  HCT 36.1  PLT 199  MCV 103.4*  MCH 29.8  MCHC 28.8*  RDW 14.2    Chemistries  Recent Labs  Lab 11/01/18 1455 11/02/18 0601  NA 143 145  K 4.1 4.0  CL 98 98  CO2 33* 37*  GLUCOSE 99 136*  BUN 31* 32*  CREATININE 0.68 0.69  CALCIUM 8.7* 8.3*  AST 17  --   ALT 13  --   ALKPHOS 93  --   BILITOT 0.5  --    ------------------------------------------------------------------------------------------------------------------ No results for input(s): CHOL, HDL, LDLCALC, TRIG, CHOLHDL, LDLDIRECT in the last 72 hours.  Lab  Results  Component Value Date   HGBA1C 6.0 (H) 08/25/2013   ------------------------------------------------------------------------------------------------------------------ No results for input(s): TSH, T4TOTAL, T3FREE, THYROIDAB in the last 72 hours.  Invalid input(s): FREET3 ------------------------------------------------------------------------------------------------------------------ No results for input(s): VITAMINB12, FOLATE, FERRITIN, TIBC, IRON, RETICCTPCT in the last 72 hours.  Coagulation profile Recent Labs  Lab 11/01/18 1740 11/02/18 0601  INR 2.8* 2.7*    No results for input(s): DDIMER in the last 72 hours.  Cardiac Enzymes No results for input(s): CKMB, TROPONINI, MYOGLOBIN in the last 168 hours.  Invalid input(s): CK ------------------------------------------------------------------------------------------------------------------  Component Value Date/Time   BNP 591.0 (H) 11/01/2018 1455    Roxan Hockey M.D on 11/02/2018 at 4:56 PM  Go to [email or web address] - for contact info  [facility] - Office  [phone]

## 2018-11-02 NOTE — Plan of Care (Signed)

## 2018-11-02 NOTE — Progress Notes (Signed)
*  PRELIMINARY RESULTS* Echocardiogram 2D Echocardiogram has been performed.  [name] 11/02/2018, 12:38 PM

## 2018-11-02 NOTE — ED Notes (Signed)
Patient off BiPAP , Knows where she is. Gave warm blanket, Ginger ale. States she wants something to eat. Placed on 4 liters which she wears at home saturation 97.

## 2018-11-03 LAB — PROTIME-INR
INR: 1.9 — ABNORMAL HIGH (ref 0.8–1.2)
Prothrombin Time: 21.8 seconds — ABNORMAL HIGH (ref 11.4–15.2)

## 2018-11-03 MED ORDER — METHYLPREDNISOLONE SODIUM SUCC 40 MG IJ SOLR
40.0000 mg | Freq: Two times a day (BID) | INTRAMUSCULAR | Status: DC
  Administered 2018-11-03 – 2018-11-05 (×4): 40 mg via INTRAVENOUS
  Filled 2018-11-03 (×4): qty 1

## 2018-11-03 MED ORDER — ACETAMINOPHEN 325 MG PO TABS
650.0000 mg | ORAL_TABLET | Freq: Four times a day (QID) | ORAL | Status: DC | PRN
  Administered 2018-11-03: 650 mg via ORAL
  Filled 2018-11-03: qty 2

## 2018-11-03 MED ORDER — WARFARIN SODIUM 7.5 MG PO TABS
7.5000 mg | ORAL_TABLET | Freq: Once | ORAL | Status: AC
  Administered 2018-11-03: 7.5 mg via ORAL
  Filled 2018-11-03: qty 1

## 2018-11-03 NOTE — Progress Notes (Signed)
1945- patient's neighbor, [place], visited (placed in contacts list). Requested to become patient's primary contact as she would be the one caring for Ms. Birks after she is discharged and said she had the patient's car. The Patient and Ms. Talbert Nan seemed close and the conversation I observed leads me to believe Ms. Talbert Nan has been helping Ms. Tarolli in the past. Patient and Ms. Talbert Nan were talking about something related to money and I became concerned. Ms. Talbert Nan said she would return tomorrow afternoon. After Ms. Irwin left I asked Ms. Willhoit about her. She said Ms. Talbert Nan was her friend and she had been looking for help and Ms. Talbert Nan was the first to offer. Explained to the patient social work would be up to speak with her in the morning and that she did not have to accept help from anyone she was not comfortable getting help from. When I asked the patient about her daughter she said her daughter lives in [place] and they used to be close, but not anymore.

## 2018-11-03 NOTE — Progress Notes (Signed)
Placed patient on BiPAP for sleep 14/6 auto , dream station oxygen is supplied by nasal cannula under Mask 2 lpm. Saturation is 96.

## 2018-11-03 NOTE — Evaluation (Signed)
Physical Therapy Evaluation Patient Details Name: [name] MRN: [id number] DOB: 14-Sep-1950 Today's Date: 11/03/2018   History of Present Illness  Kattya Locker is a 68 y.o. female with a history of coronary artery disease with history of MI x4 and stents, factor V Leiden with history of PE on chronic anticoagulation, COPD, chronic systolic heart failure with EF of 30 to 35% on echo in 07/2018, hypothyroidism.  Patient unable to provide history due to confusion.  Patient found acutely confused by EMS.  They were called by friend.  She entered the door without any close and urinated on the floor.  She was brought to the hospital for evaluation.  No palliating or provoking factors.  She has chronic respiratory failure and wears oxygen, which she was not wearing upon EMS arrival.    Clinical Impression  Patient presents alert with normal behavior and functioning near baseline for functional mobility and gait.  Patient able to ambulate in room/hallways without loss of balance while on 2 LPM with SpO2 at 89%, had to take a sitting rest break and required O2 increased to 3 LPM to walk back to room.  Patient tolerated sitting up in chair with 2 LPM O2 with SpO2 at 95% - RN notified that patient left in chair and requested to have chair alarm off.     Follow Up Recommendations Home health PT;Supervision - Intermittent    Equipment Recommendations  None recommended by PT    Recommendations for Other Services       Precautions / Restrictions Precautions Precautions: Fall Precaution Comments: Home O2 dependent Restrictions Weight Bearing Restrictions: No      Mobility  Bed Mobility Overal bed mobility: Modified Independent             General bed mobility comments: increased time, slightly labored movement  Transfers Overall transfer level: Needs assistance Equipment used: Rolling walker (2 wheeled) Transfers: Sit to/from [facility] Sit to Stand:  Supervision;Min guard Stand pivot transfers: Supervision;Min guard       General transfer comment: slightly increased time, labored movement  Ambulation/Gait Ambulation/Gait assistance: Supervision;Min guard Gait Distance (Feet): 80 Feet Assistive device: Rolling walker (2 wheeled) Gait Pattern/deviations: Decreased step length - right;Decreased step length - left;Decreased stride length Gait velocity: decreased   General Gait Details: slightly labored cadence without loss of balance, on 2 LPM with SpO2 at 89%, once fatigued, SpO2 dropped to 87%, had to take sitting rest break and O2 increased to 3 LPM for ambulating back to room  Stairs            Wheelchair Mobility    Modified Rankin (Stroke Patients Only)       Balance Overall balance assessment: Needs assistance Sitting-balance support: Feet supported;No upper extremity supported Sitting balance-Leahy Scale: Good Sitting balance - Comments: seated at EOB   Standing balance support: Bilateral upper extremity supported;During functional activity Standing balance-Leahy Scale: Fair Standing balance comment: using RW                             Pertinent Vitals/Pain Pain Assessment: 0-10 Pain Score: 5  Pain Location: stomach Pain Descriptors / Indicators: Aching Pain Intervention(s): Limited activity within patient's tolerance;Monitored during session    Home Living Family/patient expects to be discharged to:: Private residence Living Arrangements: Alone Available Help at Discharge: Family;Available PRN/intermittently Type of Home: House Home Access: Stairs to enter Entrance Stairs-Rails: Right;Left;Can reach both Entrance Stairs-Number of Steps: 6 in front (  rails to wide to reach both), 3 on the side (can reach both siderails) Home Layout: One level Home Equipment: Walker - 2 wheels      Prior Function Level of Independence: Independent with assistive device(s)         Comments: household  ambulator with RW most of time, sometimes Rollator     Hand Dominance   Dominant Hand: Right    Extremity/Trunk Assessment   Upper Extremity Assessment Upper Extremity Assessment: Defer to OT evaluation    Lower Extremity Assessment Lower Extremity Assessment: Generalized weakness    Cervical / Trunk Assessment Cervical / Trunk Assessment: Normal  Communication   Communication: No difficulties  Cognition Arousal/Alertness: Awake/alert Behavior During Therapy: WFL for tasks assessed/performed Overall Cognitive Status: Within Functional Limits for tasks assessed                                        General Comments      Exercises     Assessment/Plan    PT Assessment Patient needs continued PT services  PT Problem List Decreased strength;Decreased activity tolerance;Decreased balance;Decreased mobility       PT Treatment Interventions Gait training;Stair training;Functional mobility training;Therapeutic activities;Therapeutic exercise;Patient/family education    PT Goals (Current goals can be found in the Care Plan section)  Acute Rehab PT Goals Patient Stated Goal: return home with neighbor to assist PT Goal Formulation: With patient Time For Goal Achievement: 11/08/18 Potential to Achieve Goals: Good    Frequency Min 3X/week   Barriers to discharge        Co-evaluation               AM-PAC PT "6 Clicks" Mobility  Outcome Measure Help needed turning from your back to your side while in a flat bed without using bedrails?: None Help needed moving from lying on your back to sitting on the side of a flat bed without using bedrails?: None Help needed moving to and from a bed to a chair (including a wheelchair)?: A Little Help needed standing up from a chair using your arms (e.g., wheelchair or bedside chair)?: A Little Help needed to walk in hospital room?: A Little Help needed climbing 3-5 steps with a railing? : A Lot 6 Click Score:  19    End of Session Equipment Utilized During Treatment: Oxygen Activity Tolerance: Patient tolerated treatment well;Patient limited by fatigue Patient left: in chair;with call bell/phone within reach Nurse Communication: Mobility status PT Visit Diagnosis: Unsteadiness on feet (R26.81);Other abnormalities of gait and mobility (R26.89);Muscle weakness (generalized) (M62.81)    Time: [id number] PT Time Calculation (min) (ACUTE ONLY): 33 min   Charges:   PT Evaluation $PT Eval Moderate Complexity: 1 Mod PT Treatments $Therapeutic Activity: 23-37 mins        12:07 PM, 11/03/18 Lonell Grandchild, MPT Physical Therapist with [facility] 336 [phone] office [phone] mobile phone

## 2018-11-03 NOTE — Plan of Care (Signed)
  Problem: Acute Rehab PT Goals(only PT should resolve) Goal: Pt Will Go Supine/Side To Sit Outcome: Progressing Flowsheets (Taken 11/03/2018 1209) Pt will go Supine/Side to Sit: Independently Goal: Patient Will Perform Sitting Balance Outcome: Progressing Flowsheets (Taken 11/03/2018 1209) Patient will perform sitting balance: with modified independence Goal: Patient Will Transfer Sit To/From Stand Outcome: Progressing Flowsheets (Taken 11/03/2018 1209) Patient will transfer sit to/from stand: with modified independence Goal: Pt Will Ambulate Outcome: Progressing Flowsheets (Taken 11/03/2018 1209) Pt will Ambulate:  100 feet  with modified independence  with rolling walker   12:10 PM, 11/03/18 Lonell Grandchild, MPT Physical Therapist with [facility] [phone] office [phone] mobile phone

## 2018-11-03 NOTE — Progress Notes (Signed)
ANTICOAGULATION CONSULT NOTE -   Pharmacy Consult for Warfarin Indication: History of PE/DVT, Factor V Leiden mutation  Allergies  Allergen Reactions  . Dilaudid [Hydromorphone Hcl] Hives and Nausea Only  . Minocycline Hcl     REACTION: Dizzy  . Prednisone     REACTION: feels like throat swelling, hallucinations  . Varenicline Tartrate     REACTION: Dizzy(chantix)   . Zocor [Simvastatin - High Dose] Other (See Comments)    myalgia    Patient Measurements: Height: 5\' 4"  (162.6 cm) Weight: 145 lb 8.1 oz (66 kg) IBW/kg (Calculated) : 54.7  Vital Signs: Temp: 99.4 F (37.4 C) (10/25 0543) Temp Source: Oral (10/25 0543) BP: 121/49 (10/25 0543) Pulse Rate: 69 (10/25 0543)  Labs: Recent Labs    11/01/18 1455 11/01/18 1740 11/02/18 0601 11/03/18 0619  HGB 10.4*  --   --   --   HCT 36.1  --   --   --   PLT 199  --   --   --   LABPROT  --  29.3* 28.3* 21.8*  INR  --  2.8* 2.7* 1.9*  CREATININE 0.68  --  0.69  --     Estimated Creatinine Clearance: 62.9 mL/min (by C-G formula based on SCr of 0.69 mg/dL).   Medical History: Past Medical History:  Diagnosis Date  . Acute MI ([place])    x4, code blue x3  . CAD (coronary artery disease) 2002   Inf STEMI-2002. 2003-cutting balloon + brachytherapy for restenosis; subsequent acute stent thrombosis 06/2010 requiring 2 separate interventions (Zeta stent, then repeat cath with thrombectomy). focal basal inf AK, nl EF; 03/2011: Patent stents, minor nonobst  residual dz, nl EF; neg stress nuclear in 2008 and stress echo in 2009  . Chronic anticoagulation    Warfarin plus ticagrelor  . Chronic respiratory failure ([place]) 08/27/2013   On 2L 02  . COPD (chronic obstructive pulmonary disease) ([name])    02 dependent  . Factor 5 Leiden mutation, heterozygous ([place])   . Factor V Leiden, prothrombin gene mutation ([place]) 2006  . Hyperlipidemia   . Hypothyroidism   . Noncompliance   . Pelvic fracture ([place]) 2009  . Pulmonary embolism ([place])  2006   Associated with deep vein thrombosis-2006; + factor V Leiden  . Tobacco abuse    50 pack years    Medications:  Scheduled:  . arformoterol  15 mcg Nebulization BID  . bisoprolol  2.5 mg Oral Daily  . budesonide  0.5 mg Nebulization BID  . buPROPion  50 mg Oral BID  . cefdinir  300 mg Oral Q12H  . citalopram  10 mg Oral Daily  . furosemide  40 mg Intravenous Q12H  . guaiFENesin  600 mg Oral BID  . ipratropium-albuterol  3 mL Nebulization Q6H  . levothyroxine  125 mcg Oral Q0600  . methylPREDNISolone (SOLU-MEDROL) injection  40 mg Intravenous Q8H  . nicotine  14 mg Transdermal Daily  . pantoprazole  40 mg Oral BID  . polyvinyl alcohol  1-2 drop Both Eyes BID  . potassium chloride  40 mEq Oral BID  . senna  2 tablet Oral QHS  . ticagrelor  60 mg Oral BID  . Warfarin - Pharmacist Dosing Inpatient   Does not apply q1800   Infusions:   PRN: bisacodyl, LORazepam, polyethylene glycol, Resource ThickenUp Clear  Assessment: 68 yo female with factor V Leiden with history of PE on chronic anticoagulation with warfarin.  Pharmacy consulted to continue warfarin dosing while inpatient.  INR 1.9  home dose reported as 5mg  MWFSu, 7.5mg  TThSa.   Goal of Therapy:  INR 2-3  Monitor platelets per protocol   Plan:  Warfarin 7.5 mg x 1 dose. Monitor daily INR and s/s of bleeding.  Margot Ables, PharmD Clinical Pharmacist 11/03/2018 9:25 AM

## 2018-11-03 NOTE — Progress Notes (Signed)
Patient Demographics:    [name], is a 68 y.o. female, DOB - 03-10-1950, [id number]  Admit date - 11/01/2018   Admitting Physician Jani Gravel, MD  Outpatient Primary MD for the patient is [name], NP  LOS - 2   Chief Complaint  Patient presents with   Altered Mental Status        Subjective:    [name] today has no fevers,   No chest pain,   -  No Nausea, Vomiting or Diarrhea - Cough and shob is improving    Assessment  & Plan :    Principal Problem:   Acute metabolic encephalopathy Active Problems:   Hypothyroidism   History of pulmonary embolus (PE)   Factor V Leiden, prothrombin gene mutation (HCC)   COPD exacerbation ([place])   Acute on chronic respiratory failure with hypoxia and hypercapnia (HCC)   Acute on chronic systolic CHF (congestive heart failure) ([place])  Brief Summary  68 y.o. female with a history of coronary artery disease with history of MI x4 and stents, factor V Leiden with history of PE on chronic anticoagulation, COPD chronic hypoxic respiratory failure, HFpEF and  hypothyroidism admitted on 11/01/2018 with confusion/altered mentation in the setting of noncompliance with home O2 (was on 4L PTA)  A/p 1)Acute metabolic Encephalopathy secondary to hypercapnic respiratory failure--PCO2 was 98.5 and the pH was 7.23 on admission -Improved with BiPAP -UDS negative -COVID-19 negative -Serum ammonia was 18  2)HFpEF---  echo from 11/02/2018 with EF of 50 to 55% with grade 2 diastolic dysfunction basal inferolateral segment is abnormal/hypokinetic -Please note that EF was 35 to 40% on echo from 07/26/2018 -Continue Lasix and bisoprolol  3)Acute on chronic hypoxic and hypercapnic respiratory failure--- admitted with hypercapnia and respiratory acidosis - improved with BiPAP -PTA patient was on 4 L of oxygen but she was not very  compliant -Supplemental oxygen -Patient will need repeat outpatient sleep study and CPAP  4)Acute COPD exacerbation and tobacco abuse--- this is contributing to #1 and #3 above, smoking cessation advised, -c/n  Omnicef, change IV Solu-Medrol to 40 mg every 12 hours -c/n  nicotine patch, continue mucolytics and bronchodilators  5)Factor V [place] Deficiency with history of prior pulmonary embolism --- continue Coumadin therapy, INR is 1.9  6)CAD with prior MI and prior angioplasty--continue Brilinta, patient also on Coumadin for #5 above, no evidence of bleeding -Continue bisoprolol 2.5 mg daily  7)Hypothyroidism-stable, continue levothyroxine 125 mcg daily  8) depression and anxiety--- stable, continue lorazepam as needed anxiety, continue Celexa 10 mg daily along with Wellbutrin 50 mg twice daily  Disposition/Need for in-Hospital Stay- patient unable to be discharged at this time due to -acute on chronic hypoxic and hypercapnic respiratory failure requiring BiPAP  Code Status : Full  Family Communication:   NA (patient is alert, awake and coherent)   Disposition Plan  : home  Consults  :  na  DVT Prophylaxis  : Coumadin therapy  Lab Results  Component Value Date   PLT 199 11/01/2018  Inpatient Medications  Scheduled Meds:  arformoterol  15 mcg Nebulization BID   bisoprolol  2.5 mg Oral Daily   budesonide  0.5 mg Nebulization BID   buPROPion  50 mg Oral BID   cefdinir  300 mg Oral Q12H   citalopram  10 mg Oral Daily   furosemide  40 mg Intravenous Q12H   guaiFENesin  600 mg Oral BID   ipratropium-albuterol  3 mL Nebulization Q6H   levothyroxine  125 mcg Oral Q0600   methylPREDNISolone (SOLU-MEDROL) injection  40 mg Intravenous Q8H   nicotine  14 mg Transdermal Daily   pantoprazole  40 mg Oral BID   polyvinyl alcohol  1-2 drop Both Eyes BID   potassium chloride  40 mEq Oral BID   senna  2 tablet Oral QHS   ticagrelor  60 mg Oral BID   Warfarin  - Pharmacist Dosing Inpatient   Does not apply q1800   Continuous Infusions:  PRN Meds:.bisacodyl, LORazepam, polyethylene glycol, Resource ThickenUp Clear    Anti-infectives (From admission, onward)   Start     Dose/Rate Route Frequency Ordered Stop   11/02/18 2200  cefdinir (OMNICEF) capsule 300 mg     300 mg Oral Every 12 hours 11/02/18 1709     11/01/18 2100  cefTRIAXone (ROCEPHIN) 1 g in sodium chloride 0.9 % 100 mL IVPB  Status:  Discontinued     1 g 200 mL/hr over 30 Minutes Intravenous Every 24 hours 11/01/18 1945 11/02/18 1709        Objective:   Vitals:   11/03/18 0805 11/03/18 0810 11/03/18 0815 11/03/18 1426  BP:      Pulse:      Resp:      Temp:      TempSrc:      SpO2: 97% 97% 97% 96%  Weight:      Height:        Wt Readings from Last 3 Encounters:  11/02/18 66 kg  08/12/18 68.7 kg  07/07/18 70.1 kg     Intake/Output Summary (Last 24 hours) at 11/03/2018 1622 Last data filed at 11/03/2018 1100 Gross per 24 hour  Intake 240 ml  Output 1100 ml  Net -860 ml     Physical Exam  Gen:- Awake Alert, able to speak in short sentences HEENT:- [place].AT, No sclera icterus Nose- [place] 3 L/min Neck-Supple Neck,No JVD,.  Lungs-diminished in bases with scattered wheezes bilaterally CV- S1, S2 normal, regular  Abd-  +ve B.Sounds, Abd Soft, No tenderness,    Extremity/Skin:- No  edema pedal pulses present  Psych-affect is appropriate, oriented x3 Neuro-no new focal deficits, no tremors   Data Review:   Micro Results Recent Results (from the past 240 hour(s))  SARS Coronavirus 2 by RT PCR (hospital order, performed in [facility] hospital lab) Nasopharyngeal Nasopharyngeal Swab     Status: None   Collection Time: 11/01/18  3:24 PM   Specimen: Nasopharyngeal Swab  Result Value Ref Range Status   SARS Coronavirus 2 NEGATIVE NEGATIVE Final    Comment: (NOTE) If result is NEGATIVE SARS-CoV-2 target nucleic acids are NOT DETECTED. The SARS-CoV-2 RNA is  generally detectable in upper and lower  respiratory specimens during the acute phase of infection. The lowest  concentration of SARS-CoV-2 viral copies this assay can detect is 250  copies / mL. A negative result does not preclude SARS-CoV-2 infection  and should not be used as the sole basis for treatment or other  patient management decisions.  A negative  result may occur with  improper specimen collection / handling, submission of specimen other  than nasopharyngeal swab, presence of viral mutation(s) within the  areas targeted by this assay, and inadequate number of viral copies  (<250 copies / mL). A negative result must be combined with clinical  observations, patient history, and epidemiological information. If result is POSITIVE SARS-CoV-2 target nucleic acids are DETECTED. The SARS-CoV-2 RNA is generally detectable in upper and lower  respiratory specimens dur ing the acute phase of infection.  Positive  results are indicative of active infection with SARS-CoV-2.  Clinical  correlation with patient history and other diagnostic information is  necessary to determine patient infection status.  Positive results do  not rule out bacterial infection or co-infection with other viruses. If result is PRESUMPTIVE POSTIVE SARS-CoV-2 nucleic acids MAY BE PRESENT.   A presumptive positive result was obtained on the submitted specimen  and confirmed on repeat testing.  While 2019 novel coronavirus  (SARS-CoV-2) nucleic acids may be present in the submitted sample  additional confirmatory testing may be necessary for epidemiological  and / or clinical management purposes  to differentiate between  SARS-CoV-2 and other Sarbecovirus currently known to infect humans.  If clinically indicated additional testing with an alternate test  methodology [phone]) is advised. The SARS-CoV-2 RNA is generally  detectable in upper and lower respiratory sp ecimens during the acute  phase of  infection. The expected result is Negative. Fact Sheet for Patients:  StrictlyIdeas.no Fact Sheet for Healthcare Providers: BankingDealers.co.za This test is not yet approved or cleared by the [place] FDA and has been authorized for detection and/or diagnosis of SARS-CoV-2 by FDA under an Emergency Use Authorization (EUA).  This EUA will remain in effect (meaning this test can be used) for the duration of the COVID-19 declaration under Section 564(b)(1) of the Act, 21 U.S.C. section 360bbb-3(b)(1), unless the authorization is terminated or revoked sooner. Performed at [facility], [address] [place], [place] [address]     Radiology Reports Dg Chest 2 View  Result Date: 11/01/2018 CLINICAL DATA:  Shortness of breath. EXAM: CHEST - 2 VIEW COMPARISON:  October 14, 2018. FINDINGS: Stable cardiomegaly. Mild central pulmonary vascular congestion is noted. No pneumothorax is noted. Mild pulmonary edema may be present. Small bilateral pleural effusions may be present. Bony thorax is unremarkable. IMPRESSION: Stable cardiomegaly with mild central pulmonary vascular congestion and possible mild pulmonary edema. Small bilateral pleural effusions are noted. Electronically Signed   By: Marijo Conception M.D.   On: 11/01/2018 15:56   Ct Head Wo Contrast  Result Date: 11/01/2018 CLINICAL DATA:  Altered mental status, confusion EXAM: CT HEAD WITHOUT CONTRAST TECHNIQUE: Contiguous axial images were obtained from the base of the skull through the vertex without intravenous contrast. COMPARISON:  10/14/2018 FINDINGS: Brain: No evidence of acute infarction, hemorrhage, hydrocephalus, extra-axial collection or mass lesion/mass effect. Vascular: No hyperdense vessel or unexpected calcification. Skull: Normal. Negative for fracture or focal lesion. Sinuses/Orbits: No acute finding. Other: None. IMPRESSION: No acute intracranial pathology. Electronically Signed    By: Eddie Candle M.D.   On: 11/01/2018 15:46   Ct Head Wo Contrast  Result Date: 10/14/2018 CLINICAL DATA:  Altered level consciousness, EXAM: CT HEAD WITHOUT CONTRAST TECHNIQUE: Contiguous axial images were obtained from the base of the skull through the vertex without intravenous contrast. COMPARISON:  None. FINDINGS: Brain: No evidence of acute territorial infarction, hemorrhage, hydrocephalus,extra-axial collection or mass lesion/mass effect. Normal gray-white differentiation. Ventricles are normal in size  and contour. Vascular: No hyperdense vessel or unexpected calcification. Skull: The skull is intact. No fracture or focal lesion identified. Sinuses/Orbits: The visualized paranasal sinuses and mastoid air cells are clear. The orbits and globes intact. Other: None IMPRESSION: No acute intracranial abnormality. Electronically Signed   By: Prudencio Pair M.D.   On: 10/14/2018 19:08   Ct Angio Chest Pe W/cm &/or Wo Cm  Result Date: 10/14/2018 CLINICAL DATA:  Shortness of breath and altered level consciousness EXAM: CT ANGIOGRAPHY CHEST WITH CONTRAST TECHNIQUE: Multidetector CT imaging of the chest was performed using the standard protocol during bolus administration of intravenous contrast. Multiplanar CT image reconstructions and MIPs were obtained to evaluate the vascular anatomy. CONTRAST:  111mL OMNIPAQUE IOHEXOL 350 MG/ML SOLN COMPARISON:  November 14, 2017 FINDINGS: Cardiovascular: There is a optimal opacification of the pulmonary arteries. There is no central,segmental, or subsegmental filling defects within the pulmonary arteries. The heart is normal in size. No pericardial effusion thickening. No evidence right heart strain. There is a normal 3 vessel brachiocephalic arch. Coronary artery and aortic atherosclerosis seen. No aneurysmal dilatation the intrathoracic aorta. Mediastinum/Nodes: No hilar, mediastinal, or axillary adenopathy. Thyroid gland, trachea, and esophagus demonstrate no significant  findings. Lungs/Pleura: Extensive centrilobular emphysematous changes are seen again in both upper lung. There is pleural thickening and streaky scarring seen at the left lung base as on the prior exam. Focal area of subpleural nodularity seen at the posterior right lower lung. The lungs are otherwise clear. Upper Abdomen: No acute abnormalities present in the visualized portions of the upper abdomen. Musculoskeletal: No chest wall abnormality. No acute or significant osseous findings. Review of the MIP images confirms the above findings. IMPRESSION: No central, segmental, or subsegmental pulmonary embolism. Extensive centrilobular emphysematous changes throughout both lungs with scarring and pleural thickening at the left lung base. Tiny non-specific focal subpleural nodularity in the posterior right lower lung, likely focal area of atelectasis. Electronically Signed   By: Prudencio Pair M.D.   On: 10/14/2018 19:12   Dg Chest Port 1 View  Result Date: 10/14/2018 CLINICAL DATA:  Cough and shortness of breath. EXAM: PORTABLE CHEST 1 VIEW COMPARISON:  08/01/2018 FINDINGS: The heart is mildly enlarged but stable. There is mild tortuosity and calcification of the thoracic aorta. Underlying emphysematous changes are noted. Superimposed peribronchial thickening and increased interstitial markings may suggest superimposed interstitial edema. Possible small effusions. No definite infiltrates. IMPRESSION: 1. Underlying emphysematous changes. 2. Suspect superimposed interstitial pulmonary edema and small effusions. Electronically Signed   By: Marijo Sanes M.D.   On: 10/14/2018 14:49     CBC Recent Labs  Lab 11/01/18 1455  WBC 5.4  HGB 10.4*  HCT 36.1  PLT 199  MCV 103.4*  MCH 29.8  MCHC 28.8*  RDW 14.2    Chemistries  Recent Labs  Lab 11/01/18 1455 11/02/18 0601  NA 143 145  K 4.1 4.0  CL 98 98  CO2 33* 37*  GLUCOSE 99 136*  BUN 31* 32*  CREATININE 0.68 0.69  CALCIUM 8.7* 8.3*  AST 17  --    ALT 13  --   ALKPHOS 93  --   BILITOT 0.5  --    No results for input(s): CHOL, HDL, LDLCALC, TRIG, CHOLHDL, LDLDIRECT in the last 72 hours.  Lab Results  Component Value Date   HGBA1C 6.0 (H) 08/25/2013    No results for input(s): TSH, T4TOTAL, T3FREE, THYROIDAB in the last 72 hours.  Invalid input(s): FREET3 ------------------------------------------------------------------------------------------------------------------ No results for input(s): VITAMINB12,  FOLATE, FERRITIN, TIBC, IRON, RETICCTPCT in the last 72 hours.  Coagulation profile Recent Labs  Lab 11/01/18 1740 11/02/18 0601 11/03/18 0619  INR 2.8* 2.7* 1.9*   No results for input(s): DDIMER in the last 72 hours.  Cardiac Enzymes No results for input(s): CKMB, TROPONINI, MYOGLOBIN in the last 168 hours.  Invalid input(s): CK ------------------------------------------------------------------------------------------------------------------    Component Value Date/Time   BNP 591.0 (H) 11/01/2018 1455    Roxan Hockey M.D on 11/03/2018 at 4:22 PM  Go to [email or web address] - for contact info  [facility] - Office  [phone]

## 2018-11-03 NOTE — Plan of Care (Signed)

## 2018-11-03 NOTE — Progress Notes (Signed)
Patient used BiPAP for about 3 hrs , machine ran auto 12/6 2 liters.

## 2018-11-04 LAB — PROTIME-INR
INR: 2.2 — ABNORMAL HIGH (ref 0.8–1.2)
Prothrombin Time: 24.4 seconds — ABNORMAL HIGH (ref 11.4–15.2)

## 2018-11-04 MED ORDER — WARFARIN SODIUM 5 MG PO TABS
5.0000 mg | ORAL_TABLET | Freq: Once | ORAL | Status: AC
  Administered 2018-11-04: 5 mg via ORAL
  Filled 2018-11-04: qty 1

## 2018-11-04 MED ORDER — INFLUENZA VAC A&B SA ADJ QUAD 0.5 ML IM PRSY: 0.5000 mL | PREFILLED_SYRINGE | INTRAMUSCULAR | Status: DC

## 2018-11-04 MED ORDER — ALBUTEROL SULFATE (2.5 MG/3ML) 0.083% IN NEBU: 2.5000 mg | INHALATION_SOLUTION | Freq: Four times a day (QID) | RESPIRATORY_TRACT | Status: DC | PRN

## 2018-11-04 MED ORDER — IPRATROPIUM-ALBUTEROL 0.5-2.5 (3) MG/3ML IN SOLN
3.0000 mL | Freq: Three times a day (TID) | RESPIRATORY_TRACT | Status: DC
  Administered 2018-11-05: 3 mL via RESPIRATORY_TRACT
  Filled 2018-11-04: qty 3

## 2018-11-04 MED ORDER — INFLUENZA VAC A&B SA ADJ QUAD 0.5 ML IM PRSY
0.5000 mL | PREFILLED_SYRINGE | INTRAMUSCULAR | Status: AC
  Administered 2018-11-04: 0.5 mL via INTRAMUSCULAR
  Filled 2018-11-04: qty 0.5

## 2018-11-04 MED ORDER — WARFARIN SODIUM 7.5 MG PO TABS: 7.5000 mg | ORAL_TABLET | Freq: Once | ORAL | Status: DC

## 2018-11-04 NOTE — Progress Notes (Signed)
Patient Demographics:    [name], is a 68 y.o. female, DOB - Feb 19, 1950, [id number]  Admit date - 11/01/2018   Admitting Physician Jani Gravel, MD  Outpatient Primary MD for the patient is [name], NP  LOS - 3   Chief Complaint  Patient presents with   Altered Mental Status        Subjective:    [name] today has no fevers,   No chest pain,   -  No Nausea, Vomiting or Diarrhea - Cough and shob is improving- -patient's friend, neighbor who supposed to bring patient's home oxygen and transport patient back home apparently is having difficulty getting here today    Assessment  & Plan :    Principal Problem:   Acute metabolic encephalopathy Active Problems:   Hypothyroidism   History of pulmonary embolus (PE)   Factor V Leiden, prothrombin gene mutation (HCC)   COPD exacerbation ([place])   Acute on chronic respiratory failure with hypoxia and hypercapnia (HCC)   Acute on chronic systolic CHF (congestive heart failure) ([place])  Brief Summary  68 y.o. female with a history of coronary artery disease with history of MI x4 and stents, factor V Leiden with history of PE on chronic anticoagulation, COPD chronic hypoxic respiratory failure, HFpEF and  hypothyroidism admitted on 11/01/2018 with confusion/altered mentation in the setting of noncompliance with home O2 (was on 4L PTA)  A/p 1)Acute metabolic Encephalopathy secondary to hypercapnic respiratory failure--PCO2 was 98.5 and the pH was 7.23 on admission -Patient is using CPAP nightly, respiratory status continues to improve -UDS negative -COVID-19 negative -Serum ammonia was 18  2)HFpEF---  echo from 11/02/2018 with EF of 50 to 55% with grade 2 diastolic dysfunction basal inferolateral segment is abnormal/hypokinetic -Please note that EF was 35 to 40% on echo from 07/26/2018 -Continue Lasix and bisoprolol   3)Acute  on chronic hypoxic and hypercapnic respiratory failure--- admitted with hypercapnia and respiratory acidosis - improved with BiPAP -PTA patient was on 4 L of oxygen but she was not very compliant -Supplemental oxygen -Patient will need to follow-up with Dr. Phillips Odor to schedule repeat outpatient sleep study and CPAP  4)Acute COPD exacerbation and tobacco abuse--- this is contributing to #1 and #3 above, smoking cessation advised, -c/n  Omnicef, continue IV Solu-Medrol 40 mg every 12 hours -c/n  nicotine patch, continue mucolytics and bronchodilators  5)Factor V Leyden Deficiency with history of prior pulmonary embolism --- continue Coumadin therapy, INR is 2.2  6)CAD with prior MI and prior angioplasty--continue Brilinta, patient also on Coumadin for #5 above, no evidence of bleeding -Continue bisoprolol 2.5 mg daily  7)Hypothyroidism-stable, continue levothyroxine 125 mcg daily  8) depression and anxiety--- stable, continue lorazepam as needed anxiety, continue Celexa 10 mg daily along with Wellbutrin 50 mg twice daily  Disposition/Need for in-Hospital Stay- patient unable to be discharged at this time due to -acute on chronic hypoxic and hypercapnic respiratory failure requiring BiPAP --patient's friend, neighbor who supposed to bring patient's home oxygen and transport patient back  home apparently is having difficulty getting here today  Code Status : Full  Family Communication:   (patient is alert, awake and coherent) -Tried calling patient's friend and neighbor - No answer  Disposition Plan  : home  Consults  :  na  DVT Prophylaxis  : Coumadin therapy  Lab Results  Component Value Date   PLT 199 11/01/2018    Inpatient Medications  Scheduled Meds:  arformoterol  15 mcg Nebulization BID   bisoprolol  2.5 mg Oral Daily   budesonide  0.5 mg Nebulization BID   buPROPion  50 mg Oral BID   cefdinir  300 mg Oral Q12H   citalopram  10 mg Oral Daily   furosemide   40 mg Intravenous Q12H   guaiFENesin  600 mg Oral BID   ipratropium-albuterol  3 mL Nebulization Q6H   levothyroxine  125 mcg Oral Q0600   methylPREDNISolone (SOLU-MEDROL) injection  40 mg Intravenous Q12H   nicotine  14 mg Transdermal Daily   pantoprazole  40 mg Oral BID   polyvinyl alcohol  1-2 drop Both Eyes BID   potassium chloride  40 mEq Oral BID   senna  2 tablet Oral QHS   ticagrelor  60 mg Oral BID   Warfarin - Pharmacist Dosing Inpatient   Does not apply q1800   Continuous Infusions:  PRN Meds:.acetaminophen, bisacodyl, LORazepam, polyethylene glycol, Resource ThickenUp Clear    Anti-infectives (From admission, onward)   Start     Dose/Rate Route Frequency Ordered Stop   11/02/18 2200  cefdinir (OMNICEF) capsule 300 mg     300 mg Oral Every 12 hours 11/02/18 1709     11/01/18 2100  cefTRIAXone (ROCEPHIN) 1 g in sodium chloride 0.9 % 100 mL IVPB  Status:  Discontinued     1 g 200 mL/hr over 30 Minutes Intravenous Every 24 hours 11/01/18 1945 11/02/18 1709        Objective:   Vitals:   11/04/18 0613 11/04/18 0753 11/04/18 1404 11/04/18 1514  BP: (!) 123/54   (!) 117/51  Pulse: 66   75  Resp: 20     Temp: 99 F (37.2 C)   99 F (37.2 C)  TempSrc: Oral   Oral  SpO2: 98% 98% 95% 97%  Weight: 62.5 kg     Height:        Wt Readings from Last 3 Encounters:  11/04/18 62.5 kg  08/12/18 68.7 kg  07/07/18 70.1 kg     Intake/Output Summary (Last 24 hours) at 11/04/2018 1845 Last data filed at 11/04/2018 1700 Gross per 24 hour  Intake 480 ml  Output 1900 ml  Net -1420 ml     Physical Exam  Gen:- Awake Alert, no acute distress HEENT:- [place].AT, No sclera icterus Nose- [place] 3 L/min Neck-Supple Neck,No JVD,.  Lungs-improving air movement, very few wheezes  CV- S1, S2 normal, regular  Abd-  +ve B.Sounds, Abd Soft, No tenderness,    Extremity/Skin:- No  edema pedal pulses present  Psych-affect is appropriate, oriented x3 Neuro-generalized  weakness, no new focal deficits, no tremors   Data Review:   Micro Results Recent Results (from the past 240 hour(s))  SARS Coronavirus 2 by RT PCR (hospital order, performed in [facility] hospital lab) Nasopharyngeal Nasopharyngeal Swab     Status: None   Collection Time: 11/01/18  3:24 PM   Specimen: Nasopharyngeal Swab  Result Value Ref Range Status   SARS Coronavirus 2 NEGATIVE NEGATIVE Final    Comment: (NOTE) If  result is NEGATIVE SARS-CoV-2 target nucleic acids are NOT DETECTED. The SARS-CoV-2 RNA is generally detectable in upper and lower  respiratory specimens during the acute phase of infection. The lowest  concentration of SARS-CoV-2 viral copies this assay can detect is 250  copies / mL. A negative result does not preclude SARS-CoV-2 infection  and should not be used as the sole basis for treatment or other  patient management decisions.  A negative result may occur with  improper specimen collection / handling, submission of specimen other  than nasopharyngeal swab, presence of viral mutation(s) within the  areas targeted by this assay, and inadequate number of viral copies  (<250 copies / mL). A negative result must be combined with clinical  observations, patient history, and epidemiological information. If result is POSITIVE SARS-CoV-2 target nucleic acids are DETECTED. The SARS-CoV-2 RNA is generally detectable in upper and lower  respiratory specimens dur ing the acute phase of infection.  Positive  results are indicative of active infection with SARS-CoV-2.  Clinical  correlation with patient history and other diagnostic information is  necessary to determine patient infection status.  Positive results do  not rule out bacterial infection or co-infection with other viruses. If result is PRESUMPTIVE POSTIVE SARS-CoV-2 nucleic acids MAY BE PRESENT.   A presumptive positive result was obtained on the submitted specimen  and confirmed on repeat testing.  While  2019 novel coronavirus  (SARS-CoV-2) nucleic acids may be present in the submitted sample  additional confirmatory testing may be necessary for epidemiological  and / or clinical management purposes  to differentiate between  SARS-CoV-2 and other Sarbecovirus currently known to infect humans.  If clinically indicated additional testing with an alternate test  methodology [phone]) is advised. The SARS-CoV-2 RNA is generally  detectable in upper and lower respiratory sp ecimens during the acute  phase of infection. The expected result is Negative. Fact Sheet for Patients:  StrictlyIdeas.no Fact Sheet for Healthcare Providers: BankingDealers.co.za This test is not yet approved or cleared by the [place] FDA and has been authorized for detection and/or diagnosis of SARS-CoV-2 by FDA under an Emergency Use Authorization (EUA).  This EUA will remain in effect (meaning this test can be used) for the duration of the COVID-19 declaration under Section 564(b)(1) of the Act, 21 U.S.C. section 360bbb-3(b)(1), unless the authorization is terminated or revoked sooner. Performed at [facility], [address] [place], [place] [address]   Culture, Urine     Status: Abnormal (Preliminary result)   Collection Time: 11/01/18  4:08 PM   Specimen: Urine, Catheterized  Result Value Ref Range Status   Specimen Description   Final    URINE, CATHETERIZED Performed at [facility], [address], [place], [place] [address]    Special Requests   Final    NONE Performed at [facility], [address] [place], [place] [address]    Culture (A)  Final    >=100,000 COLONIES/mL ENTEROCOCCUS FAECIUM SUSCEPTIBILITIES TO FOLLOW Performed at [facility], [place] [address] [place], [place] [address]    Report Status PENDING  Incomplete    Radiology Reports Dg Chest 2 View  Result Date: 11/01/2018 CLINICAL DATA:  Shortness of breath. EXAM: CHEST - 2  VIEW COMPARISON:  October 14, 2018. FINDINGS: Stable cardiomegaly. Mild central pulmonary vascular congestion is noted. No pneumothorax is noted. Mild pulmonary edema may be present. Small bilateral pleural effusions may be present. Bony thorax is unremarkable. IMPRESSION: Stable cardiomegaly with mild central pulmonary vascular  congestion and possible mild pulmonary edema. Small bilateral pleural effusions are noted. Electronically Signed   By: Marijo Conception M.D.   On: 11/01/2018 15:56   Ct Head Wo Contrast  Result Date: 11/01/2018 CLINICAL DATA:  Altered mental status, confusion EXAM: CT HEAD WITHOUT CONTRAST TECHNIQUE: Contiguous axial images were obtained from the base of the skull through the vertex without intravenous contrast. COMPARISON:  10/14/2018 FINDINGS: Brain: No evidence of acute infarction, hemorrhage, hydrocephalus, extra-axial collection or mass lesion/mass effect. Vascular: No hyperdense vessel or unexpected calcification. Skull: Normal. Negative for fracture or focal lesion. Sinuses/Orbits: No acute finding. Other: None. IMPRESSION: No acute intracranial pathology. Electronically Signed   By: Eddie Candle M.D.   On: 11/01/2018 15:46   Ct Head Wo Contrast  Result Date: 10/14/2018 CLINICAL DATA:  Altered level consciousness, EXAM: CT HEAD WITHOUT CONTRAST TECHNIQUE: Contiguous axial images were obtained from the base of the skull through the vertex without intravenous contrast. COMPARISON:  None. FINDINGS: Brain: No evidence of acute territorial infarction, hemorrhage, hydrocephalus,extra-axial collection or mass lesion/mass effect. Normal gray-white differentiation. Ventricles are normal in size and contour. Vascular: No hyperdense vessel or unexpected calcification. Skull: The skull is intact. No fracture or focal lesion identified. Sinuses/Orbits: The visualized paranasal sinuses and mastoid air cells are clear. The orbits and globes intact. Other: None IMPRESSION: No acute  intracranial abnormality. Electronically Signed   By: Prudencio Pair M.D.   On: 10/14/2018 19:08   Ct Angio Chest Pe W/cm &/or Wo Cm  Result Date: 10/14/2018 CLINICAL DATA:  Shortness of breath and altered level consciousness EXAM: CT ANGIOGRAPHY CHEST WITH CONTRAST TECHNIQUE: Multidetector CT imaging of the chest was performed using the standard protocol during bolus administration of intravenous contrast. Multiplanar CT image reconstructions and MIPs were obtained to evaluate the vascular anatomy. CONTRAST:  141mL OMNIPAQUE IOHEXOL 350 MG/ML SOLN COMPARISON:  November 14, 2017 FINDINGS: Cardiovascular: There is a optimal opacification of the pulmonary arteries. There is no central,segmental, or subsegmental filling defects within the pulmonary arteries. The heart is normal in size. No pericardial effusion thickening. No evidence right heart strain. There is a normal 3 vessel brachiocephalic arch. Coronary artery and aortic atherosclerosis seen. No aneurysmal dilatation the intrathoracic aorta. Mediastinum/Nodes: No hilar, mediastinal, or axillary adenopathy. Thyroid gland, trachea, and esophagus demonstrate no significant findings. Lungs/Pleura: Extensive centrilobular emphysematous changes are seen again in both upper lung. There is pleural thickening and streaky scarring seen at the left lung base as on the prior exam. Focal area of subpleural nodularity seen at the posterior right lower lung. The lungs are otherwise clear. Upper Abdomen: No acute abnormalities present in the visualized portions of the upper abdomen. Musculoskeletal: No chest wall abnormality. No acute or significant osseous findings. Review of the MIP images confirms the above findings. IMPRESSION: No central, segmental, or subsegmental pulmonary embolism. Extensive centrilobular emphysematous changes throughout both lungs with scarring and pleural thickening at the left lung base. Tiny non-specific focal subpleural nodularity in the posterior  right lower lung, likely focal area of atelectasis. Electronically Signed   By: Prudencio Pair M.D.   On: 10/14/2018 19:12   Dg Chest Port 1 View  Result Date: 10/14/2018 CLINICAL DATA:  Cough and shortness of breath. EXAM: PORTABLE CHEST 1 VIEW COMPARISON:  08/01/2018 FINDINGS: The heart is mildly enlarged but stable. There is mild tortuosity and calcification of the thoracic aorta. Underlying emphysematous changes are noted. Superimposed peribronchial thickening and increased interstitial markings may suggest superimposed interstitial edema. Possible small effusions. No  definite infiltrates. IMPRESSION: 1. Underlying emphysematous changes. 2. Suspect superimposed interstitial pulmonary edema and small effusions. Electronically Signed   By: Marijo Sanes M.D.   On: 10/14/2018 14:49     CBC Recent Labs  Lab 11/01/18 1455  WBC 5.4  HGB 10.4*  HCT 36.1  PLT 199  MCV 103.4*  MCH 29.8  MCHC 28.8*  RDW 14.2    Chemistries  Recent Labs  Lab 11/01/18 1455 11/02/18 0601  NA 143 145  K 4.1 4.0  CL 98 98  CO2 33* 37*  GLUCOSE 99 136*  BUN 31* 32*  CREATININE 0.68 0.69  CALCIUM 8.7* 8.3*  AST 17  --   ALT 13  --   ALKPHOS 93  --   BILITOT 0.5  --    No results for input(s): CHOL, HDL, LDLCALC, TRIG, CHOLHDL, LDLDIRECT in the last 72 hours.  Lab Results  Component Value Date   HGBA1C 6.0 (H) 08/25/2013    No results for input(s): TSH, T4TOTAL, T3FREE, THYROIDAB in the last 72 hours.  Invalid input(s): FREET3 ------------------------------------------------------------------------------------------------------------------ No results for input(s): VITAMINB12, FOLATE, FERRITIN, TIBC, IRON, RETICCTPCT in the last 72 hours.  Coagulation profile Recent Labs  Lab 11/01/18 1740 11/02/18 0601 11/03/18 0619 11/04/18 0636  INR 2.8* 2.7* 1.9* 2.2*   No results for input(s): DDIMER in the last 72 hours.  Cardiac Enzymes No results for input(s): CKMB, TROPONINI, MYOGLOBIN in the  last 168 hours.  Invalid input(s): CK ------------------------------------------------------------------------------------------------------------------    Component Value Date/Time   BNP 591.0 (H) 11/01/2018 1455    Roxan Hockey M.D on 11/04/2018 at 6:45 PM  Go to [email or web address] - for contact info  [facility] - Office  [phone]

## 2018-11-04 NOTE — Care Management Important Message (Signed)
Important Message  Patient Details  Name: [name] MRN: [id number] Date of Birth: 18-Jan-1950   Medicare Important Message Given:  Yes     [name] 11/04/2018, 1:32 PM

## 2018-11-04 NOTE — TOC Transition Note (Signed)
Transition of Care [facility]) - CM/SW Discharge Note   Patient Details  Name: [name] MRN: [id number] Date of Birth: 05-21-1950  Transition of Care [facility]) CM/SW Contact:  Boneta Lucks, RN Phone Number: 11/04/2018, 10:30 AM   Clinical Narrative:   Patient admitted with acute Metabolic encephalopathy. Patient has a high readmission score.  Patient lives at home alone. Her friend and neighbor has her car and drive her where she needs to go. Caren Griffins agrees to pick up patient at discharge and bring her home oxygen.  CM asking for [place] to get resumption orders. Patient states she does not have [place], she does not know who they are saying called EMS.  Only her friend was there to help her.  In the past TOC has not been able to get any [facility] companies to take her due to the home conditions.   Patient is needing a out patient sleep study. MD will refer to Dr. Simone Curia.      Barriers to Discharge: Barriers Resolved   Patient Goals and CMS Choice Patient states their goals for this hospitalization and ongoing recovery are:: to go home with help of a friend.      Discharge Placement       Readmission Risk Interventions Readmission Risk Prevention Plan 11/04/2018 08/06/2018 07/19/2018  Transportation Screening Complete Complete -  Medication Review [employer]) Complete Complete Referral to Pharmacy  PCP or Specialist appointment within 3-5 days of discharge Complete Complete Not Complete  PCP/Specialist Appt Not Complete comments - - Continued medical workup  HRI or Home Care Consult Complete Complete Complete  SW Recovery Care/Counseling Consult Complete Complete Complete  Palliative Care Screening Not Complete Not Applicable Not Applicable  Skilled [place] Not Complete Complete Complete  Some recent data might be hidden

## 2018-11-04 NOTE — Progress Notes (Signed)
ANTICOAGULATION CONSULT NOTE -   Pharmacy Consult for Warfarin Indication: History of PE/DVT, Factor V Leiden mutation  Patient Measurements: Height: 5\' 4"  (162.6 cm) Weight: 137 lb 12.6 oz (62.5 kg) IBW/kg (Calculated) : 54.7  Vital Signs: Temp: 99 F (37.2 C) (10/26 0613) Temp Source: Oral (10/26 0613) BP: 123/54 (10/26 0613) Pulse Rate: 66 (10/26 0613)  Labs: Recent Labs    11/01/18 1455  11/02/18 0601 11/03/18 0619 11/04/18 0636  HGB 10.4*  --   --   --   --   HCT 36.1  --   --   --   --   PLT 199  --   --   --   --   LABPROT  --    < > 28.3* 21.8* 24.4*  INR  --    < > 2.7* 1.9* 2.2*  CREATININE 0.68  --  0.69  --   --    < > = values in this interval not displayed.    Estimated Creatinine Clearance: 58.1 mL/min (by C-G formula based on SCr of 0.69 mg/dL).    Assessment: 68 yo female with factor V Leiden with history of PE on chronic anticoagulation with warfarin.  Pharmacy consulted to continue warfarin dosing while inpatient.    11-04-2018 AM UPDATE: INR: 2.2   home dose:reported as 5mg  MWFSu, 7.5mg  T/Th/Sa Concurrent anti-platelet meds: Brilinta 60mg  bid   Goal of Therapy:  INR 2-3  Monitor platelets per protocol   Plan:  Givewarfarin 5 mg x 1 dose today (home dose) Monitor daily INR and s/s of bleeding.   Despina Pole, Pharm. D. Clinical Pharmacist 11/04/2018 10:36 AM

## 2018-11-05 LAB — PROTIME-INR
INR: 2.4 — ABNORMAL HIGH (ref 0.8–1.2)
Prothrombin Time: 25.6 seconds — ABNORMAL HIGH (ref 11.4–15.2)

## 2018-11-05 MED ORDER — WARFARIN SODIUM 7.5 MG PO TABS: 7.5000 mg | ORAL_TABLET | Freq: Once | ORAL | Status: DC

## 2018-11-05 MED ORDER — PREDNISONE 20 MG PO TABS: 20.0000 mg | ORAL_TABLET | Freq: Every day | ORAL | 0 refills | Status: AC

## 2018-11-05 MED ORDER — FUROSEMIDE 20 MG PO TABS: 20.0000 mg | ORAL_TABLET | Freq: Every day | ORAL | 2 refills | Status: AC

## 2018-11-05 MED ORDER — BUDESONIDE 0.5 MG/2ML IN SUSP: 0.5000 mg | Freq: Two times a day (BID) | RESPIRATORY_TRACT | 3 refills | Status: AC

## 2018-11-05 MED ORDER — WARFARIN SODIUM 7.5 MG PO TABS: ORAL_TABLET | ORAL | 11 refills | Status: AC

## 2018-11-05 MED ORDER — CITALOPRAM HYDROBROMIDE 10 MG PO TABS: 10.0000 mg | ORAL_TABLET | Freq: Every day | ORAL | 5 refills | Status: DC

## 2018-11-05 MED ORDER — BISOPROLOL FUMARATE 5 MG PO TABS: 2.5000 mg | ORAL_TABLET | Freq: Every day | ORAL | 5 refills | Status: AC

## 2018-11-05 MED ORDER — POLYETHYLENE GLYCOL 3350 17 G PO PACK: 17.0000 g | PACK | Freq: Every day | ORAL | 5 refills | Status: AC

## 2018-11-05 MED ORDER — ALBUTEROL SULFATE (2.5 MG/3ML) 0.083% IN NEBU: INHALATION_SOLUTION | RESPIRATORY_TRACT | 1 refills | Status: AC

## 2018-11-05 MED ORDER — LEVOTHYROXINE SODIUM 125 MCG PO TABS: 125.0000 ug | ORAL_TABLET | Freq: Every day | ORAL | 5 refills | Status: AC

## 2018-11-05 MED ORDER — SENNA 8.6 MG PO TABS: 2.0000 | ORAL_TABLET | Freq: Every day | ORAL | 5 refills | Status: DC

## 2018-11-05 MED ORDER — GUAIFENESIN ER 600 MG PO TB12: 600.0000 mg | ORAL_TABLET | Freq: Two times a day (BID) | ORAL | 2 refills | Status: AC

## 2018-11-05 MED ORDER — CEFDINIR 300 MG PO CAPS: 300.0000 mg | ORAL_CAPSULE | Freq: Two times a day (BID) | ORAL | 0 refills | Status: AC

## 2018-11-05 MED ORDER — BUPROPION HCL 100 MG PO TABS: 100.0000 mg | ORAL_TABLET | Freq: Two times a day (BID) | ORAL | 2 refills | Status: AC

## 2018-11-05 MED ORDER — IPRATROPIUM-ALBUTEROL 0.5-2.5 (3) MG/3ML IN SOLN: 3.0000 mL | Freq: Four times a day (QID) | RESPIRATORY_TRACT | 3 refills | Status: AC

## 2018-11-05 MED ORDER — TICAGRELOR 60 MG PO TABS: 60.0000 mg | ORAL_TABLET | Freq: Two times a day (BID) | ORAL | 5 refills | Status: AC

## 2018-11-05 MED ORDER — WARFARIN SODIUM 5 MG PO TABS: ORAL_TABLET | ORAL | 11 refills | Status: AC

## 2018-11-05 MED ORDER — GUAIFENESIN ER 600 MG PO TB12: 600.0000 mg | ORAL_TABLET | Freq: Two times a day (BID) | ORAL | 0 refills | Status: AC

## 2018-11-05 MED ORDER — NICOTINE 14 MG/24HR TD PT24: 14.0000 mg | MEDICATED_PATCH | Freq: Every day | TRANSDERMAL | 0 refills | Status: AC

## 2018-11-05 NOTE — Progress Notes (Signed)
OT Cancellation Note  Patient Details Name: Arvis Gervasi MRN: [id number] DOB: November 27, 1950   Cancelled Treatment:    Reason Eval/Treat Not Completed: Patient declined, no reason specified. Pt sleeping on OT entry, easily awakened. Pt declined to participate in evaluation this am. Does report she would like a new shower seat. Will attempt evaluation at another time.    Guadelupe Sabin, OTR/L  [phone] 11/05/2018, 7:45 AM

## 2018-11-05 NOTE — Discharge Summary (Signed)
[name], is a 68 y.o. female  DOB 12-02-1950  MRN [id number].  Admission date:  11/01/2018  Admitting Physician  Jani Gravel, MD  Discharge Date:  11/05/2018   Primary MD  [name], NP  Recommendations for primary care physician for things to follow:   - 1) use oxygen via nasal cannula continuously as prescribed 2) please avoid open fires and avoid smoking with oxygen due to risk of fire, injury and death 3) you need recheck of your PT/INR around Tuesday, 11/12/2018 4) please take your medications including Coumadin as prescribed 5) you are taking Coumadin which is a blood thinner so Avoid ibuprofen/Advil/Aleve/Motrin/Goody Powders/Naproxen/BC powders/Meloxicam/Diclofenac/Indomethacin and other Nonsteroidal anti-inflammatory medications as these will make you more likely to bleed and can cause stomach ulcers, can also cause Kidney problems.  6) follow-up with your primary care physician in about a week strongly advised  Admission Diagnosis  CO2 retention 99991111 Acute metabolic encephalopathy [id number]   Discharge Diagnosis  CO2 retention 99991111 Acute metabolic encephalopathy [id number]    Principal Problem:   Acute metabolic encephalopathy Active Problems:   Hypothyroidism   History of pulmonary embolus (PE)   Factor V Leiden, prothrombin gene mutation (HCC)   COPD exacerbation (HCC)   Acute on chronic respiratory failure with hypoxia and hypercapnia (HCC)   Acute on chronic systolic CHF (congestive heart failure) (HCC)      Past Medical History:  Diagnosis Date   Acute MI ([place])    x4, code blue x3   CAD (coronary artery disease) 2002   Inf STEMI-2002. 2003-cutting balloon + brachytherapy for restenosis; subsequent acute stent thrombosis 06/2010 requiring 2 separate interventions (Zeta stent, then repeat cath with thrombectomy). focal basal inf AK, nl EF; 03/2011: Patent stents,  minor nonobst  residual dz, nl EF; neg stress nuclear in 2008 and stress echo in 2009   Chronic anticoagulation    Warfarin plus ticagrelor   Chronic respiratory failure ([place]) 08/27/2013   On 2L 02   COPD (chronic obstructive pulmonary disease) ([place])    02 dependent   Factor 5 Leiden mutation, heterozygous ([place])    Factor V Leiden, prothrombin gene mutation ([place]) 2006   Hyperlipidemia    Hypothyroidism    Noncompliance    Pelvic fracture ([place]) 2009   Pulmonary embolism ([place]) 2006   Associated with deep vein thrombosis-2006; + factor V Leiden   Tobacco abuse    50 pack years    Past Surgical History:  Procedure Laterality Date   COLONOSCOPY  Approximately 2000   Negative screening study   COLONOSCOPY WITH PROPOFOL N/A 05/28/2018   Procedure: COLONOSCOPY WITH PROPOFOL;  Surgeon: Rogene Houston, MD;  [facility];  Service: Endoscopy;  Laterality: N/A;   CORONARY ANGIOPLASTY  2002, 2003, 2012   ESOPHAGOGASTRODUODENOSCOPY (EGD) WITH PROPOFOL N/A 05/26/2018   Procedure: ESOPHAGOGASTRODUODENOSCOPY (EGD) WITH PROPOFOL;  Surgeon: Rogene Houston, MD;  [facility];  Service: Endoscopy;  Laterality: N/A;   LEFT AND RIGHT HEART CATHETERIZATION WITH CORONARY ANGIOGRAM N/A 04/03/2011  Procedure: LEFT AND RIGHT HEART CATHETERIZATION WITH CORONARY ANGIOGRAM;  Surgeon: Sherren Mocha, MD;  Location: [facility] CATH LAB;  Service: Cardiovascular;  Laterality: N/A;   POLYPECTOMY  05/28/2018   Procedure: POLYPECTOMY;  Surgeon: Rogene Houston, MD;  [facility]: [facility];  Service: Endoscopy;;  cold snare and biopsy forcep       HPI  from the history and physical done on the day of admission:    -Patient Coming From: home  Chief Complaint: confused  HPI: [name] is a 68 y.o. female with a history of coronary artery disease with history of MI x4 and stents, factor V Leiden with history of PE on chronic anticoagulation, COPD, chronic systolic heart  failure with EF of 30 to 35% on echo in 07/2018, hypothyroidism.  Patient unable to provide history due to confusion.  Patient found acutely confused by EMS.  They were called by friend.  She entered the door without any close and urinated on the floor.  She was brought to the hospital for evaluation.  No palliating or provoking factors.  She has chronic respiratory failure and wears oxygen, which she was not wearing upon EMS arrival.  Emergency Department Course: ABG shows acidosis with hypercarbia.  White count normal.  Chest x-ray shows mild pulmonary edema.  Wheezing on exam.  Patient given steroids and nebulizer treatment and magnesium in the ED.  Review of Systems:  Unable to obtain due to patient's confusion     Hospital Course:     - Brief Summary 68 y.o.femalewith a history of coronary artery disease with history of MI x4 and stents, factor V Leiden with history of PE on chronic anticoagulation, COPD chronic hypoxic respiratory failure, HFpEF and  hypothyroidism admitted on 11/01/2018 with confusion/altered mentation in the setting of noncompliance with home O2 (was on 4L PTA)  A/p 1)Acute metabolic Encephalopathy secondary to hypercapnic respiratory failure--PCO2 was 98.5 and the pH was 7.23 on admission -It appears that PTA patient was not compliant with CPAP, -With CPAP use here in the hospital respiratory status and mental status improved significantly ---UDS negative -COVID-19 negative -Serum ammonia was 18 --Patient will need to follow-up with Dr. Phillips Odor to schedule repeat outpatient sleep study and CPAP  2)HFpEF---  echo from 11/02/2018 with EF of 50 to 55% with grade 2 diastolic dysfunction basal inferolateral segment is abnormal/hypokinetic -Please note that EF was 35 to 40% on echo from 07/26/2018 -Overall improved, -Continue Lasix and bisoprolol  3)Acute on chronic hypoxic and hypercapnic respiratory failure--- admitted with hypercapnia and respiratory  acidosis - improved with BiPAP -PTA patient was on 4 L of oxygen but she was not very compliant -Oxygen requirement back to baseline -Consistent use of supplemental oxygen advised -Patient will need to follow-up with Dr. Phillips Odor to schedule repeat outpatient sleep study and CPAP  4)Acute COPD exacerbation and tobacco abuse--- this is contributing to #1 and #3 above, smoking cessation advised, -Overall much improved -Discharge home on Omnicef, and oral prednisone -c/n  nicotine patch, continue mucolytics and bronchodilators  5)Factor V Leyden Deficiency with history of prior pulmonary embolism --- continue Coumadin therapy, INR INR as advised in discharge instructions -Avoid NSAIDs with Coumadin use  6)CAD with prior MI and prior angioplasty--continue Brilinta, patient also on Coumadin for #5 above, no evidence of bleeding -Continue bisoprolol   7)Hypothyroidism-stable, continue levothyroxine 125 mcg daily  8) depression and anxiety--- stable, continue lorazepam as needed anxiety, continue Celexa 10 mg daily along with Wellbutrin 50 mg twice daily  Disposition----patient's friend, neighbor who supposed to bring patient's home oxygen and transport patient back home apparently is having difficulty getting here today -Patient was transported home by EMS -Patient is not interested in SNF rehab  Code Status : Full  Family Communication:   (patient is alert, awake and coherent) -Tried calling patient's friend and neighbor - No answer  Disposition Plan  : home, patient is not interested in SNF rehab  Discharge Condition: stable  Follow UP  Follow-up Information    Nafziger, Tommi Rumps, NP Follow up.   Specialty: Family Medicine Contact information: [address] [place] [address] [phone]        Herminio Commons, MD .   Specialty: Cardiology Contact information: [place] [address] [phone]           Diet and Activity  recommendation:  As advised  Discharge Instructions    Discharge Instructions    Call MD for:  difficulty breathing, headache or visual disturbances   Complete by: As directed    Call MD for:  persistant dizziness or light-headedness   Complete by: As directed    Call MD for:  persistant nausea and vomiting   Complete by: As directed    Call MD for:  temperature >100.4   Complete by: As directed    Diet - low sodium heart healthy   Complete by: As directed    Discharge instructions   Complete by: As directed    1) use oxygen via nasal cannula continuously as prescribed 2) please avoid open fires and avoid smoking with oxygen due to risk of fire, injury and death 3) you need recheck of your PT/INR around Tuesday, 11/12/2018 4) please take your medications including Coumadin as prescribed 5) you are taking Coumadin which is a blood thinner so Avoid ibuprofen/Advil/Aleve/Motrin/Goody Powders/Naproxen/BC powders/Meloxicam/Diclofenac/Indomethacin and other Nonsteroidal anti-inflammatory medications as these will make you more likely to bleed and can cause stomach ulcers, can also cause Kidney problems.  6) follow-up with your primary care physician in about a week strongly advised   Increase activity slowly   Complete by: As directed        Discharge Medications     Allergies as of 11/05/2018      Reactions   Dilaudid [hydromorphone Hcl] Hives, Nausea Only   Minocycline Hcl    REACTION: Dizzy   Prednisone    REACTION: feels like throat swelling, hallucinations   Varenicline Tartrate    REACTION: Dizzy(chantix)   Zocor [simvastatin - High Dose] Other (See Comments)   myalgia      Medication List    STOP taking these medications   bisacodyl 5 MG EC tablet Commonly known as: DULCOLAX     TAKE these medications   albuterol (2.5 MG/3ML) 0.083% nebulizer solution Commonly known as: PROVENTIL USE 1 VIAL BY NEBULIZER EVERY 4 HOURS AS NEEDED FOR WHEEZING. DX: J44.9 What  changed:   how much to take  how to take this  when to take this  reasons to take this  additional instructions   arformoterol 15 MCG/2ML Nebu Commonly known as: BROVANA Take 2 mLs (15 mcg total) by nebulization 2 (two) times daily.   bisoprolol 5 MG tablet Commonly known as: ZEBETA Take 0.5 tablets (2.5 mg total) by mouth daily.   budesonide 0.5 MG/2ML nebulizer solution Commonly known as: Pulmicort Take 2 mLs (0.5 mg total) by nebulization 2 (two) times daily. Dx: J43.9   buPROPion 100 MG tablet Commonly known as: WELLBUTRIN  Take 1 tablet (100 mg total) by mouth 2 (two) times daily. What changed: how much to take   cefdinir 300 MG capsule Commonly known as: OMNICEF Take 1 capsule (300 mg total) by mouth 2 (two) times daily for 3 days.   citalopram 10 MG tablet Commonly known as: CELEXA Take 1 tablet (10 mg total) by mouth daily.   Dry Eye Relief Drops 0.2-0.2-1 % Soln Generic drug: Glycerin-Hypromellose-PEG 400 Place 1-2 drops into both eyes 2 (two) times a day.   Fish Oil 600 MG Caps Take 1 capsule by mouth daily.   Flutter Devi Use as directed   furosemide 20 MG tablet Commonly known as: Lasix Take 1 tablet (20 mg total) by mouth daily.   guaiFENesin 600 MG 12 hr tablet Commonly known as: Mucinex Take 1 tablet (600 mg total) by mouth 2 (two) times daily for 10 days.   guaiFENesin 600 MG 12 hr tablet Commonly known as: MUCINEX Take 1 tablet (600 mg total) by mouth 2 (two) times daily.   ipratropium-albuterol 0.5-2.5 (3) MG/3ML Soln Commonly known as: DUONEB Take 3 mLs by nebulization 4 (four) times daily. Dx J44.9 What changed: additional instructions   levothyroxine 125 MCG tablet Commonly known as: SYNTHROID Take 1 tablet (125 mcg total) by mouth daily before breakfast. What changed: when to take this   lidocaine 5 % Commonly known as: LIDODERM Place 1 patch onto the skin daily. Remove & Discard patch within 12 hours or as directed by MD     nicotine 14 mg/24hr patch Commonly known as: NICODERM CQ - dosed in mg/24 hours Place 1 patch (14 mg total) onto the skin daily. Start taking on: November 06, 2018   nitroGLYCERIN 0.4 MG SL tablet Commonly known as: NITROSTAT Place 1 tablet (0.4 mg total) under the tongue every 5 (five) minutes as needed for chest pain.   pantoprazole 40 MG tablet Commonly known as: PROTONIX Take 1 tablet (40 mg total) by mouth 2 (two) times daily.   polyethylene glycol 17 g packet Commonly known as: MIRALAX / GLYCOLAX Take 17 g by mouth daily. What changed:   when to take this  reasons to take this   predniSONE 20 MG tablet Commonly known as: Deltasone Take 1 tablet (20 mg total) by mouth daily with breakfast.   Resource ThickenUp Clear Powd Use with meals   senna 8.6 MG Tabs tablet Commonly known as: SENOKOT Take 2 tablets (17.2 mg total) by mouth at bedtime.   ticagrelor 60 MG Tabs tablet Commonly known as: Brilinta Take 1 tablet (60 mg total) by mouth 2 (two) times daily.   warfarin 5 MG tablet Commonly known as: Coumadin Take as directed. If you are unsure how to take this medication, talk to your nurse or doctor. Original instructions: 5mg  on Monday, Wednesday, Friday, Sunday   warfarin 7.5 MG tablet Commonly known as: Coumadin Take as directed. If you are unsure how to take this medication, talk to your nurse or doctor. Original instructions: 7.5 mg on Tuesday, Thursday, Saturday       Major procedures and Radiology Reports - PLEASE review detailed and final reports for all details, in brief -    Dg Chest 2 View  Result Date: 11/01/2018 CLINICAL DATA:  Shortness of breath. EXAM: CHEST - 2 VIEW COMPARISON:  October 14, 2018. FINDINGS: Stable cardiomegaly. Mild central pulmonary vascular congestion is noted. No pneumothorax is noted. Mild pulmonary edema may be present. Small bilateral pleural effusions may be present. Bony thorax  is unremarkable. IMPRESSION: Stable  cardiomegaly with mild central pulmonary vascular congestion and possible mild pulmonary edema. Small bilateral pleural effusions are noted. Electronically Signed   By: Marijo Conception M.D.   On: 11/01/2018 15:56   Ct Head Wo Contrast  Result Date: 11/01/2018 CLINICAL DATA:  Altered mental status, confusion EXAM: CT HEAD WITHOUT CONTRAST TECHNIQUE: Contiguous axial images were obtained from the base of the skull through the vertex without intravenous contrast. COMPARISON:  10/14/2018 FINDINGS: Brain: No evidence of acute infarction, hemorrhage, hydrocephalus, extra-axial collection or mass lesion/mass effect. Vascular: No hyperdense vessel or unexpected calcification. Skull: Normal. Negative for fracture or focal lesion. Sinuses/Orbits: No acute finding. Other: None. IMPRESSION: No acute intracranial pathology. Electronically Signed   By: Eddie Candle M.D.   On: 11/01/2018 15:46   Ct Head Wo Contrast  Result Date: 10/14/2018 CLINICAL DATA:  Altered level consciousness, EXAM: CT HEAD WITHOUT CONTRAST TECHNIQUE: Contiguous axial images were obtained from the base of the skull through the vertex without intravenous contrast. COMPARISON:  None. FINDINGS: Brain: No evidence of acute territorial infarction, hemorrhage, hydrocephalus,extra-axial collection or mass lesion/mass effect. Normal gray-white differentiation. Ventricles are normal in size and contour. Vascular: No hyperdense vessel or unexpected calcification. Skull: The skull is intact. No fracture or focal lesion identified. Sinuses/Orbits: The visualized paranasal sinuses and mastoid air cells are clear. The orbits and globes intact. Other: None IMPRESSION: No acute intracranial abnormality. Electronically Signed   By: Prudencio Pair M.D.   On: 10/14/2018 19:08   Ct Angio Chest Pe W/cm &/or Wo Cm  Result Date: 10/14/2018 CLINICAL DATA:  Shortness of breath and altered level consciousness EXAM: CT ANGIOGRAPHY CHEST WITH CONTRAST TECHNIQUE:  Multidetector CT imaging of the chest was performed using the standard protocol during bolus administration of intravenous contrast. Multiplanar CT image reconstructions and MIPs were obtained to evaluate the vascular anatomy. CONTRAST:  164mL OMNIPAQUE IOHEXOL 350 MG/ML SOLN COMPARISON:  November 14, 2017 FINDINGS: Cardiovascular: There is a optimal opacification of the pulmonary arteries. There is no central,segmental, or subsegmental filling defects within the pulmonary arteries. The heart is normal in size. No pericardial effusion thickening. No evidence right heart strain. There is a normal 3 vessel brachiocephalic arch. Coronary artery and aortic atherosclerosis seen. No aneurysmal dilatation the intrathoracic aorta. Mediastinum/Nodes: No hilar, mediastinal, or axillary adenopathy. Thyroid gland, trachea, and esophagus demonstrate no significant findings. Lungs/Pleura: Extensive centrilobular emphysematous changes are seen again in both upper lung. There is pleural thickening and streaky scarring seen at the left lung base as on the prior exam. Focal area of subpleural nodularity seen at the posterior right lower lung. The lungs are otherwise clear. Upper Abdomen: No acute abnormalities present in the visualized portions of the upper abdomen. Musculoskeletal: No chest wall abnormality. No acute or significant osseous findings. Review of the MIP images confirms the above findings. IMPRESSION: No central, segmental, or subsegmental pulmonary embolism. Extensive centrilobular emphysematous changes throughout both lungs with scarring and pleural thickening at the left lung base. Tiny non-specific focal subpleural nodularity in the posterior right lower lung, likely focal area of atelectasis. Electronically Signed   By: Prudencio Pair M.D.   On: 10/14/2018 19:12   Dg Chest Port 1 View  Result Date: 10/14/2018 CLINICAL DATA:  Cough and shortness of breath. EXAM: PORTABLE CHEST 1 VIEW COMPARISON:  08/01/2018  FINDINGS: The heart is mildly enlarged but stable. There is mild tortuosity and calcification of the thoracic aorta. Underlying emphysematous changes are noted. Superimposed peribronchial thickening and increased  interstitial markings may suggest superimposed interstitial edema. Possible small effusions. No definite infiltrates. IMPRESSION: 1. Underlying emphysematous changes. 2. Suspect superimposed interstitial pulmonary edema and small effusions. Electronically Signed   By: Marijo Sanes M.D.   On: 10/14/2018 14:49    Micro Results   Recent Results (from the past 240 hour(s))  SARS Coronavirus 2 by RT PCR (hospital order, performed in [facility] hospital lab) Nasopharyngeal Nasopharyngeal Swab     Status: None   Collection Time: 11/01/18  3:24 PM   Specimen: Nasopharyngeal Swab  Result Value Ref Range Status   SARS Coronavirus 2 NEGATIVE NEGATIVE Final    Comment: (NOTE) If result is NEGATIVE SARS-CoV-2 target nucleic acids are NOT DETECTED. The SARS-CoV-2 RNA is generally detectable in upper and lower  respiratory specimens during the acute phase of infection. The lowest  concentration of SARS-CoV-2 viral copies this assay can detect is 250  copies / mL. A negative result does not preclude SARS-CoV-2 infection  and should not be used as the sole basis for treatment or other  patient management decisions.  A negative result may occur with  improper specimen collection / handling, submission of specimen other  than nasopharyngeal swab, presence of viral mutation(s) within the  areas targeted by this assay, and inadequate number of viral copies  (<250 copies / mL). A negative result must be combined with clinical  observations, patient history, and epidemiological information. If result is POSITIVE SARS-CoV-2 target nucleic acids are DETECTED. The SARS-CoV-2 RNA is generally detectable in upper and lower  respiratory specimens dur ing the acute phase of infection.  Positive  results  are indicative of active infection with SARS-CoV-2.  Clinical  correlation with patient history and other diagnostic information is  necessary to determine patient infection status.  Positive results do  not rule out bacterial infection or co-infection with other viruses. If result is PRESUMPTIVE POSTIVE SARS-CoV-2 nucleic acids MAY BE PRESENT.   A presumptive positive result was obtained on the submitted specimen  and confirmed on repeat testing.  While 2019 novel coronavirus  (SARS-CoV-2) nucleic acids may be present in the submitted sample  additional confirmatory testing may be necessary for epidemiological  and / or clinical management purposes  to differentiate between  SARS-CoV-2 and other Sarbecovirus currently known to infect humans.  If clinically indicated additional testing with an alternate test  methodology [phone]) is advised. The SARS-CoV-2 RNA is generally  detectable in upper and lower respiratory sp ecimens during the acute  phase of infection. The expected result is Negative. Fact Sheet for Patients:  StrictlyIdeas.no Fact Sheet for Healthcare Providers: BankingDealers.co.za This test is not yet approved or cleared by the [place] FDA and has been authorized for detection and/or diagnosis of SARS-CoV-2 by FDA under an Emergency Use Authorization (EUA).  This EUA will remain in effect (meaning this test can be used) for the duration of the COVID-19 declaration under Section 564(b)(1) of the Act, 21 U.S.C. section 360bbb-3(b)(1), unless the authorization is terminated or revoked sooner. Performed at [facility], [address] [place], [place] [address]   Culture, Urine     Status: Abnormal (Preliminary result)   Collection Time: 11/01/18  4:08 PM   Specimen: Urine, Catheterized  Result Value Ref Range Status   Specimen Description   Final    URINE, CATHETERIZED Performed at [facility], [address]  [place], [place] [address]    Special Requests   Final    NONE Performed at [facility],  [address] [place], [place] [address]    Culture (A)  Final    >=100,000 COLONIES/mL ENTEROCOCCUS FAECIUM SUSCEPTIBILITIES TO FOLLOW Performed at [facility], [place] [address], [place], [place] [address]    Report Status PENDING  Incomplete       Today   Subjective    Keryn Teutsch today has no new concerns,  No Nausea, Vomiting or Diarrhea  No fever no chills,  no productive cough -Eager to go home -Requesting EMS transfer home as her friend is unable to pick up       Patient has been seen and examined prior to discharge   Objective   Blood pressure (!) 100/45, pulse 66, temperature 99 F (37.2 C), temperature source Oral, resp. rate 20, height 5\' 4"  (1.626 m), weight 60.9 kg, SpO2 100 %.   Intake/Output Summary (Last 24 hours) at 11/05/2018 1341 Last data filed at 11/05/2018 0556 Gross per 24 hour  Intake 480 ml  Output 1350 ml  Net -870 ml    Exam Gen:- Awake Alert, no acute distress  HEENT:- [place].AT, No sclera icterus Neck-Supple Neck,No JVD,. \ Nose- [place] 3 L/min Lungs-improved air movement, no wheezing  CV- S1, S2 normal, regular Abd-  +ve B.Sounds, Abd Soft, No tenderness,    Extremity/Skin:- No  edema,   good pulses Psych-affect is appropriate, oriented x3 Neuro-generalized weakness, no new focal deficits, no tremors    Data Review   CBC w Diff:  Lab Results  Component Value Date   WBC 5.4 11/01/2018   HGB 10.4 (L) 11/01/2018   HCT 36.1 11/01/2018   PLT 199 11/01/2018   LYMPHOPCT 13 10/14/2018   MONOPCT 9 10/14/2018   EOSPCT 2 10/14/2018   BASOPCT 0 10/14/2018    CMP:  Lab Results  Component Value Date   NA 145 11/02/2018   NA 142 02/04/2018   K 4.0 11/02/2018   CL 98 11/02/2018   CO2 37 (H) 11/02/2018   BUN 32 (H) 11/02/2018   BUN 21 02/04/2018   CREATININE 0.69 11/02/2018   GLU 92 02/04/2018   PROT 6.6 11/01/2018   ALBUMIN 3.5  11/01/2018   BILITOT 0.5 11/01/2018   ALKPHOS 93 11/01/2018   AST 17 11/01/2018   ALT 13 11/01/2018  .   Total Discharge time is about 33 minutes  Roxan Hockey M.D on 11/05/2018 at 1:41 PM  Go to [email or web address] -  for contact info  [facility] - Office  [phone]

## 2018-11-05 NOTE — TOC Transition Note (Signed)
Transition of Care [facility]) - CM/SW Discharge Note   Patient Details  Name: [name] MRN: [id number] Date of Birth: 01/29/1950  Transition of Care [facility]) CM/SW Contact:  Boneta Lucks, RN Phone Number: 11/05/2018, 12:43 PM   Clinical Narrative:   Multiple attempt to by multiple people to  reach her friend Caren Griffins, she will not return anyone's call. She states yesterday she was on her way to see the patient and would come get her at discharge with oxygen, Caren Griffins has patient car.  Patient is ready to go home and requesting EMS. TOC called and scheduled EMS, RN and MD updated.      Barriers to Discharge: Barriers Resolved   Patient Goals and CMS Choice Patient states their goals for this hospitalization and ongoing recovery are:: to go home with help of a friend.      Discharge Placement    Home with self/ care- patient has oxygen at home.             Readmission Risk Interventions Readmission Risk Prevention Plan 11/04/2018 08/06/2018 07/19/2018  Transportation Screening Complete Complete -  Medication Review Press photographer) Complete Complete Referral to Pharmacy  PCP or Specialist appointment within 3-5 days of discharge Complete Complete Not Complete  PCP/Specialist Appt Not Complete comments - - Continued medical workup  HRI or Home Care Consult Complete Complete Complete  SW Recovery Care/Counseling Consult Complete Complete Complete  Palliative Care Screening Not Complete Not Applicable Not Applicable  Skilled Nursing Facility Not Complete Complete Complete  Some recent data might be hidden

## 2018-11-05 NOTE — Progress Notes (Signed)
Nsg Discharge Note  Admit Date:  11/01/2018 Discharge date: 11/05/2018   Saree Boye to be D/C'd home per MD order.  AVS completed.  Copy for chart, and copy for patient signed, and dated. Patient/caregiver able to verbalize understanding.  Discharge Medication: Allergies as of 11/05/2018      Reactions   Dilaudid [hydromorphone Hcl] Hives, Nausea Only   Minocycline Hcl    REACTION: Dizzy   Prednisone    REACTION: feels like throat swelling, hallucinations   Varenicline Tartrate    REACTION: Dizzy(chantix)   Zocor [simvastatin - High Dose] Other (See Comments)   myalgia      Medication List    STOP taking these medications   bisacodyl 5 MG EC tablet Commonly known as: DULCOLAX     TAKE these medications   albuterol (2.5 MG/3ML) 0.083% nebulizer solution Commonly known as: PROVENTIL USE 1 VIAL BY NEBULIZER EVERY 4 HOURS AS NEEDED FOR WHEEZING. DX: J44.9 What changed:   how much to take  how to take this  when to take this  reasons to take this  additional instructions   arformoterol 15 MCG/2ML Nebu Commonly known as: BROVANA Take 2 mLs (15 mcg total) by nebulization 2 (two) times daily.   bisoprolol 5 MG tablet Commonly known as: ZEBETA Take 0.5 tablets (2.5 mg total) by mouth daily.   budesonide 0.5 MG/2ML nebulizer solution Commonly known as: Pulmicort Take 2 mLs (0.5 mg total) by nebulization 2 (two) times daily. Dx: J43.9   buPROPion 100 MG tablet Commonly known as: WELLBUTRIN Take 1 tablet (100 mg total) by mouth 2 (two) times daily. What changed: how much to take   cefdinir 300 MG capsule Commonly known as: OMNICEF Take 1 capsule (300 mg total) by mouth 2 (two) times daily for 3 days.   citalopram 10 MG tablet Commonly known as: CELEXA Take 1 tablet (10 mg total) by mouth daily.   Dry Eye Relief Drops 0.2-0.2-1 % Soln Generic drug: Glycerin-Hypromellose-PEG 400 Place 1-2 drops into both eyes 2 (two) times a day.   Fish Oil 600 MG  Caps Take 1 capsule by mouth daily.   Flutter Devi Use as directed   furosemide 20 MG tablet Commonly known as: Lasix Take 1 tablet (20 mg total) by mouth daily.   guaiFENesin 600 MG 12 hr tablet Commonly known as: Mucinex Take 1 tablet (600 mg total) by mouth 2 (two) times daily for 10 days.   guaiFENesin 600 MG 12 hr tablet Commonly known as: MUCINEX Take 1 tablet (600 mg total) by mouth 2 (two) times daily.   ipratropium-albuterol 0.5-2.5 (3) MG/3ML Soln Commonly known as: DUONEB Take 3 mLs by nebulization 4 (four) times daily. Dx J44.9 What changed: additional instructions   levothyroxine 125 MCG tablet Commonly known as: SYNTHROID Take 1 tablet (125 mcg total) by mouth daily before breakfast. What changed: when to take this   lidocaine 5 % Commonly known as: LIDODERM Place 1 patch onto the skin daily. Remove & Discard patch within 12 hours or as directed by MD   nicotine 14 mg/24hr patch Commonly known as: NICODERM CQ - dosed in mg/24 hours Place 1 patch (14 mg total) onto the skin daily. Start taking on: November 06, 2018   nitroGLYCERIN 0.4 MG SL tablet Commonly known as: NITROSTAT Place 1 tablet (0.4 mg total) under the tongue every 5 (five) minutes as needed for chest pain.   pantoprazole 40 MG tablet Commonly known as: PROTONIX Take 1 tablet (40 mg total)  by mouth 2 (two) times daily.   polyethylene glycol 17 g packet Commonly known as: MIRALAX / GLYCOLAX Take 17 g by mouth daily. What changed:   when to take this  reasons to take this   predniSONE 20 MG tablet Commonly known as: Deltasone Take 1 tablet (20 mg total) by mouth daily with breakfast.   Resource ThickenUp Clear Powd Use with meals   senna 8.6 MG Tabs tablet Commonly known as: SENOKOT Take 2 tablets (17.2 mg total) by mouth at bedtime.   ticagrelor 60 MG Tabs tablet Commonly known as: Brilinta Take 1 tablet (60 mg total) by mouth 2 (two) times daily.   warfarin 5 MG  tablet Commonly known as: Coumadin Take as directed. If you are unsure how to take this medication, talk to your nurse or doctor. Original instructions: 5mg  on Monday, Wednesday, Friday, Sunday   warfarin 7.5 MG tablet Commonly known as: Coumadin Take as directed. If you are unsure how to take this medication, talk to your nurse or doctor. Original instructions: 7.5 mg on Tuesday, Thursday, Saturday       Discharge Assessment: Vitals:   11/05/18 0433 11/05/18 0756  BP: (!) 100/45   Pulse: 66   Resp: 20   Temp: 99 F (37.2 C)   SpO2: 99% 100%   Skin clean, dry and intact without evidence of skin break down, no evidence of skin tears noted. IV catheter discontinued intact. Site without signs and symptoms of complications - no redness or edema noted at insertion site, patient denies c/o pain - only slight tenderness at site.  Dressing with slight pressure applied.  D/c Instructions-Education: Discharge instructions given to patient/family with verbalized understanding. D/c education completed with patient/family including follow up instructions, medication list, d/c activities limitations if indicated, with other d/c instructions as indicated by MD - patient able to verbalize understanding, all questions fully answered. Patient instructed to return to ED, call 911, or call MD for any changes in condition.  Patient escorted via ambulance.  Carney Corners, RN 11/05/2018 1:43 PM

## 2018-11-05 NOTE — Progress Notes (Signed)
Pt. Has stated today that she wants EMS to transport her home since she cannot rely on her neighbor to do so.

## 2018-11-05 NOTE — Progress Notes (Signed)
ANTICOAGULATION CONSULT NOTE -   Pharmacy Consult for Warfarin Indication: History of PE/DVT, Factor V Leiden mutation  Patient Measurements: Height: 5\' 4"  (162.6 cm) Weight: 134 lb 4.2 oz (60.9 kg) IBW/kg (Calculated) : 54.7  Vital Signs: Temp: 99 F (37.2 C) (10/27 0433) Temp Source: Oral (10/27 0433) BP: 100/45 (10/27 0433) Pulse Rate: 66 (10/27 0433)  Labs: Recent Labs    11/03/18 0619 11/04/18 0636 11/05/18 0544  LABPROT 21.8* 24.4* 25.6*  INR 1.9* 2.2* 2.4*    Estimated Creatinine Clearance: 58.1 mL/min (by C-G formula based on SCr of 0.69 mg/dL).    Assessment: 68 yo female with factor V Leiden with history of PE on chronic anticoagulation with warfarin.  Pharmacy consulted to continue warfarin dosing while inpatient.    11-05-2018 AM :    INR: 2.4, remains therapeutic  home dose:reported as 5mg  MWFSu, 7.5mg  T/Th/Sa Concurrent anti-platelet meds: Brilinta 60mg  bid  Goal of Therapy:  INR 2-3  Monitor platelets per protocol   Plan:  Warfarin 7.5 mg x 1 dose today (home dose) Monitor daily INR and s/s of bleeding.  Isac Sarna, BS Pharm D, [place] Clinical Pharmacist Pager [phone] 11/05/2018 8:04 AM
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
## 2018-11-05 NOTE — Progress Notes (Signed)
Physical Therapy Treatment Patient Details Name: [name] MRN: [id number] DOB: Nov 05, 1950 Today's Date: 11/05/2018    History of Present Illness [name] is a 68 y.o. female with a history of coronary artery disease with history of MI x4 and stents, factor V Leiden with history of PE on chronic anticoagulation, COPD, chronic systolic heart failure with EF of 30 to 35% on echo in 07/2018, hypothyroidism.  Patient unable to provide history due to confusion.  Patient found acutely confused by EMS.  They were called by friend.  She entered the door without any close and urinated on the floor.  She was brought to the hospital for evaluation.  No palliating or provoking factors.  She has chronic respiratory failure and wears oxygen, which she was not wearing upon EMS arrival.    PT Comments    Pt supine in bed and willing to participate with therapy today.  Pt independent bed mobility wihtout assistance, just increased time to complete.   Gait training with RW and 3L O2 A, able to ambulate without LOB episodes.  Pt was limited by fatigue, required 1 seated rest break during session.  EOS pt left in chair with call bell within reach, chair alarm set and RN notified to replace pure whick.     Follow Up Recommendations  Home health PT;Supervision - Intermittent     Equipment Recommendations  None recommended by PT    Recommendations for Other Services       Precautions / Restrictions Precautions Precautions: Fall Precaution Comments: Home O2 dependent    Mobility  Bed Mobility Overal bed mobility: Modified Independent             General bed mobility comments: I bed mobility, increased time, slightly labored movement  Transfers Overall transfer level: Modified independent Equipment used: Rolling walker (2 wheeled) Transfers: Sit to/from Stand Sit to Stand: Supervision         General transfer comment: slightly increased time, labored  movement  Ambulation/Gait Ambulation/Gait assistance: Supervision;Min guard Gait Distance (Feet): 80 Feet Assistive device: Rolling walker (2 wheeled) Gait Pattern/deviations: Decreased step length - right;Decreased step length - left;Decreased stride length Gait velocity: decreased   General Gait Details: slightly labored cadence without LOB on 3LPM with SpO2 range from 94-99%.  Required 1 seated rest break during session due to fatigue   Stairs             Wheelchair Mobility    Modified Rankin (Stroke Patients Only)       Balance                                            Cognition Arousal/Alertness: Awake/alert Behavior During Therapy: WFL for tasks assessed/performed Overall Cognitive Status: Within Functional Limits for tasks assessed                                        Exercises      General Comments        Pertinent Vitals/Pain Pain Assessment: No/denies pain    Home Living                      Prior Function            PT Goals (current goals can now be  found in the care plan section)      Frequency    Min 3X/week      PT Plan      Co-evaluation              AM-PAC PT "6 Clicks" Mobility   Outcome Measure  Help needed turning from your back to your side while in a flat bed without using bedrails?: None Help needed moving from lying on your back to sitting on the side of a flat bed without using bedrails?: None Help needed moving to and from a bed to a chair (including a wheelchair)?: A Little Help needed standing up from a chair using your arms (e.g., wheelchair or bedside chair)?: A Little Help needed to walk in hospital room?: A Little Help needed climbing 3-5 steps with a railing? : A Lot 6 Click Score: 19    End of Session Equipment Utilized During Treatment: Oxygen Activity Tolerance: Patient tolerated treatment well;Patient limited by fatigue Patient left: in  chair;with call bell/phone within reach;with chair alarm set Nurse Communication: Mobility status(Pure whick replacement) PT Visit Diagnosis: Unsteadiness on feet (R26.81);Other abnormalities of gait and mobility (R26.89);Muscle weakness (generalized) (M62.81)     Time: [id number] PT Time Calculation (min) (ACUTE ONLY): 20 min  Charges:  $Therapeutic Activity: 8-22 mins                     [address] LPTA; CBIS [phone]  Aldona Lento 11/05/2018, 1:02 PM

## 2018-11-07 ENCOUNTER — Telehealth: Payer: Self-pay | Admitting: *Deleted

## 2018-11-07 NOTE — Telephone Encounter (Signed)
Attempted to contact patient. A female answered and reports the patient was not in and to call back later.

## 2018-11-08 ENCOUNTER — Other Ambulatory Visit: Payer: Self-pay

## 2018-11-08 ENCOUNTER — Telehealth (INDEPENDENT_AMBULATORY_CARE_PROVIDER_SITE_OTHER): Payer: Medicare Other | Admitting: Adult Health

## 2018-11-08 ENCOUNTER — Telehealth: Payer: Self-pay | Admitting: *Deleted

## 2018-11-08 DIAGNOSIS — D682 Hereditary deficiency of other clotting factors: Secondary | ICD-10-CM | POA: Diagnosis not present

## 2018-11-08 DIAGNOSIS — G9341 Metabolic encephalopathy: Secondary | ICD-10-CM

## 2018-11-08 DIAGNOSIS — F32A Depression, unspecified: Secondary | ICD-10-CM

## 2018-11-08 DIAGNOSIS — E440 Moderate protein-calorie malnutrition: Secondary | ICD-10-CM | POA: Diagnosis not present

## 2018-11-08 DIAGNOSIS — Z9981 Dependence on supplemental oxygen: Secondary | ICD-10-CM

## 2018-11-08 DIAGNOSIS — J441 Chronic obstructive pulmonary disease with (acute) exacerbation: Secondary | ICD-10-CM

## 2018-11-08 DIAGNOSIS — I5023 Acute on chronic systolic (congestive) heart failure: Secondary | ICD-10-CM | POA: Diagnosis not present

## 2018-11-08 DIAGNOSIS — F419 Anxiety disorder, unspecified: Secondary | ICD-10-CM

## 2018-11-08 DIAGNOSIS — F329 Major depressive disorder, single episode, unspecified: Secondary | ICD-10-CM

## 2018-11-08 MED ORDER — CITALOPRAM HYDROBROMIDE 20 MG PO TABS: 20.0000 mg | ORAL_TABLET | Freq: Every day | ORAL | 1 refills | Status: AC
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
## 2018-11-08 NOTE — Telephone Encounter (Signed)
Transition Care Management Follow-up Telephone Call   Date discharged? 11/05/2018   How have you been since you were released from the hospital? I've been okay    Do you understand why you were in the hospital? yes   Do you understand the discharge instructions? no   Where were you discharged to?  Home    Items Reviewed:  Medications reviewed: yes  Allergies reviewed: yes  Dietary changes reviewed: N/A   Referrals reviewed: yes; Cardiology    Functional Questionnaire:   Activities of Daily Living (ADLs):   She states they are independent in the following: ambulation, feeding and continence States they require assistance with the following: bathing and hygiene, grooming, toileting and dressing   Any transportation issues/concerns?: no   Any patient concerns? Yes; does not understand her discharge instructions and they have changed her medications she has questions about.    Confirmed importance and date/time of follow-up visits scheduled yes  Provider Appointment booked with [name], NP today at [place]   Confirmed with patient if condition begins to worsen call PCP or go to the ER.  Patient was given the office number and encouraged to call back with question or concerns.  : yes

## 2018-11-09 LAB — URINE CULTURE: Culture: >=100000 — AB

## 2018-11-22 ENCOUNTER — Other Ambulatory Visit: Payer: Self-pay | Admitting: Adult Health

## 2018-11-28 ENCOUNTER — Other Ambulatory Visit: Payer: Self-pay | Admitting: Adult Health

## 2018-11-28 ENCOUNTER — Telehealth: Payer: Self-pay | Admitting: *Deleted

## 2018-11-28 MED ORDER — BOUDREAUXS BUTT PASTE 16 % EX OINT: TOPICAL_OINTMENT | CUTANEOUS | 2 refills | Status: AC

## 2018-11-28 NOTE — Telephone Encounter (Signed)
Spoke to [name] and advised I do not have a DPR on file.  Spoke with the pt.  See result note.  Nothing further needed.

## 2018-11-28 NOTE — Telephone Encounter (Signed)
Copied from [place] [id number]. Topic: General - Inquiry >> Nov 28, 2018 11:51 AM Rutherford Nail, NT wrote: Reason for CRM: [place], calling on behalf of the patient. States that someone from the office called them last week and was saying that Tommi Rumps was going to send something to the pharmacy for the patient's urine and a request for diaper rash cream was mentioned. States that they have not heard anything else and the pharmacy does not have what was mentioned. Please advise.  CB#: [phone] or [phone]

## 2018-12-03 ENCOUNTER — Telehealth: Payer: Self-pay | Admitting: Adult Health

## 2018-12-03 NOTE — Telephone Encounter (Signed)
[name] with funeral service called and said that they send a cremation paperwork yesterday and would like a confirmation that office got it and what the turn around would be. Please call her back, thanks.

## 2018-12-06 NOTE — Telephone Encounter (Signed)
[name] with the funeral home is calling in to follow up on death certificate that was faxed to the provider. Requested that she re-fax incase not received. Also would like to have faxed back to them at the number below as soon as possible for cremation for pt.   Fax: [phone]

## 2018-12-09 NOTE — Telephone Encounter (Signed)
Please fax another copy to funeral home. They need a 2nd one.

## 2018-12-10 NOTE — Telephone Encounter (Signed)
Please follow up

## 2018-12-10 DEATH — deceased

## 2020-06-22 IMAGING — DX DG CHEST 2V
2 series · 2 of 2 positions shown · non-contrast
Comparison: None.

CLINICAL DATA: Shortness of breath.

EXAM:
CHEST - 2 VIEW

[chest pa]
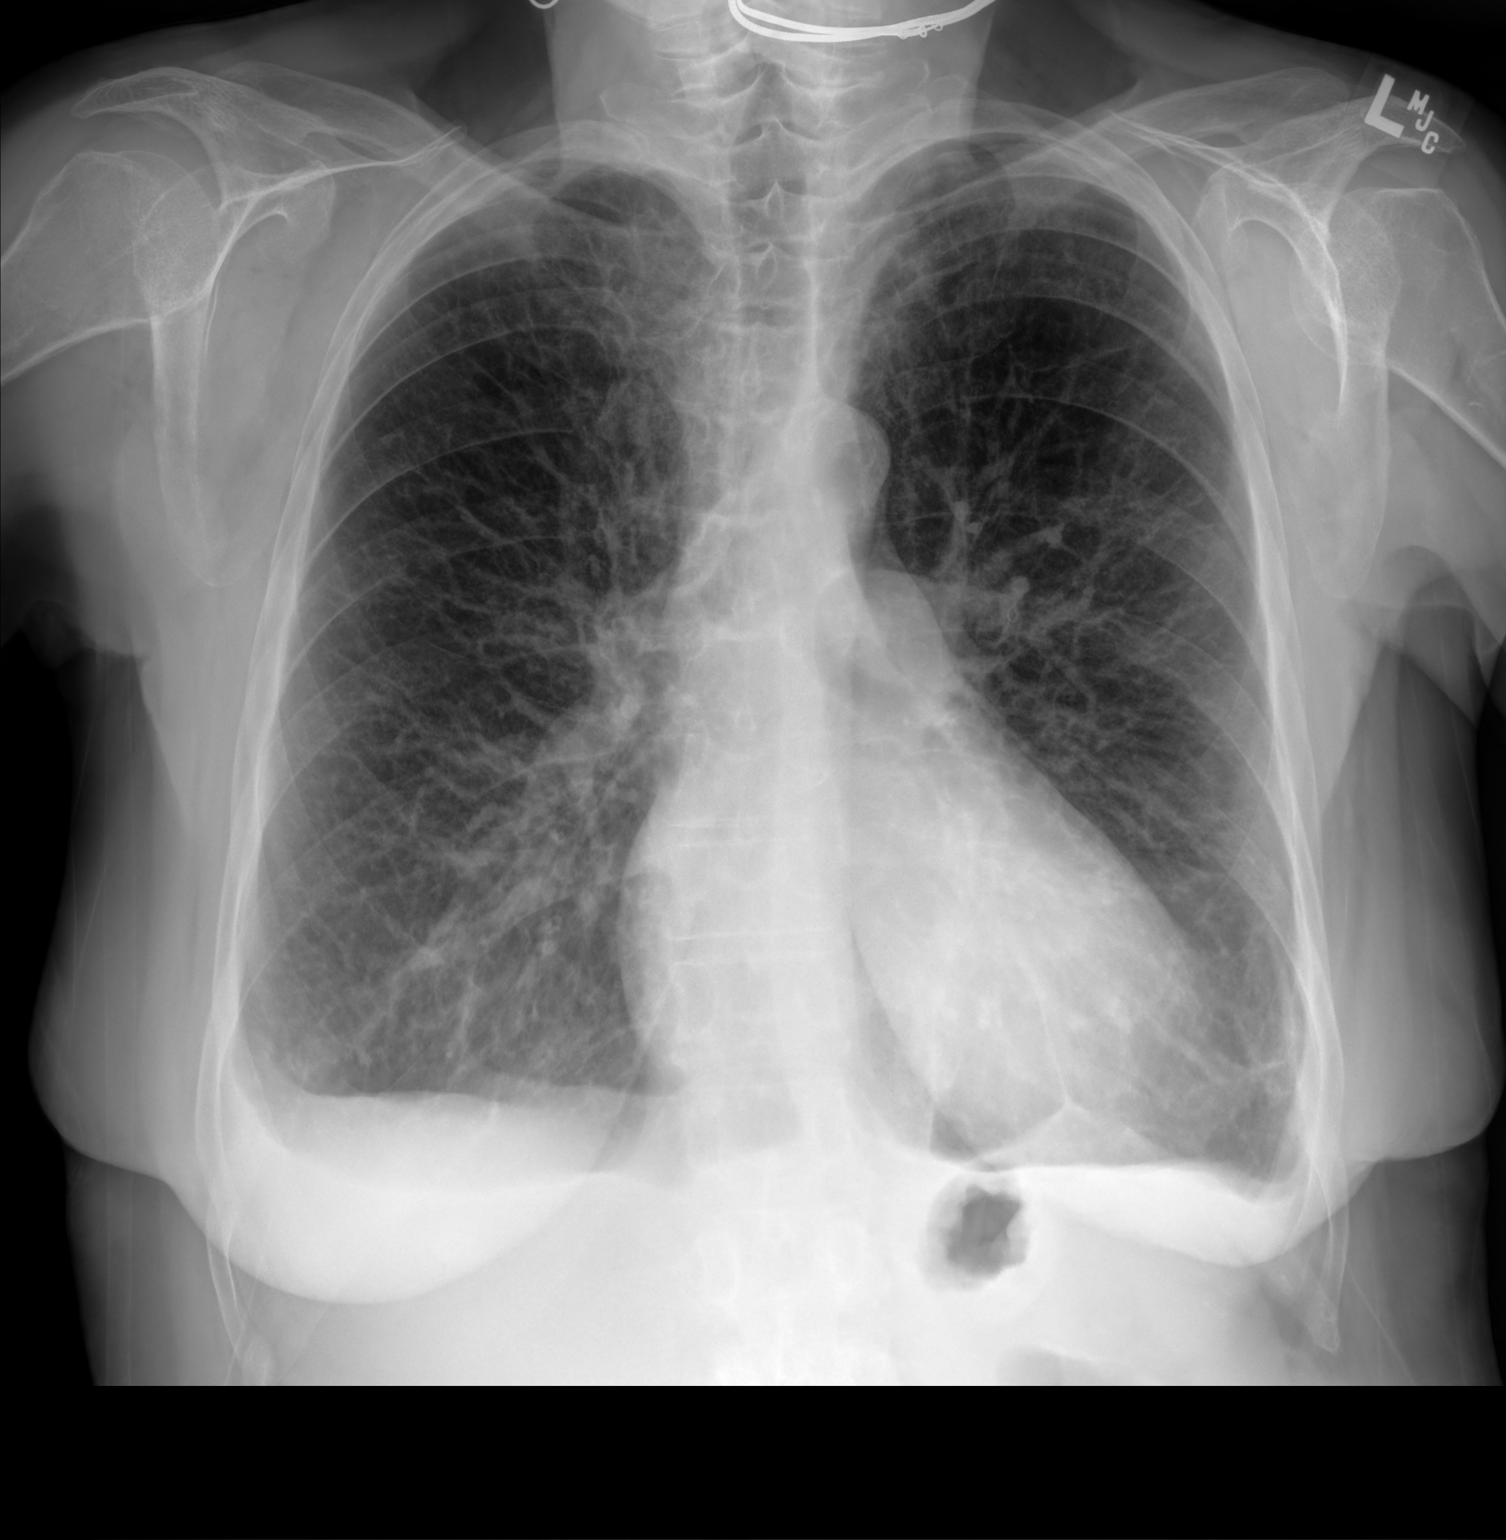

[chest lat]
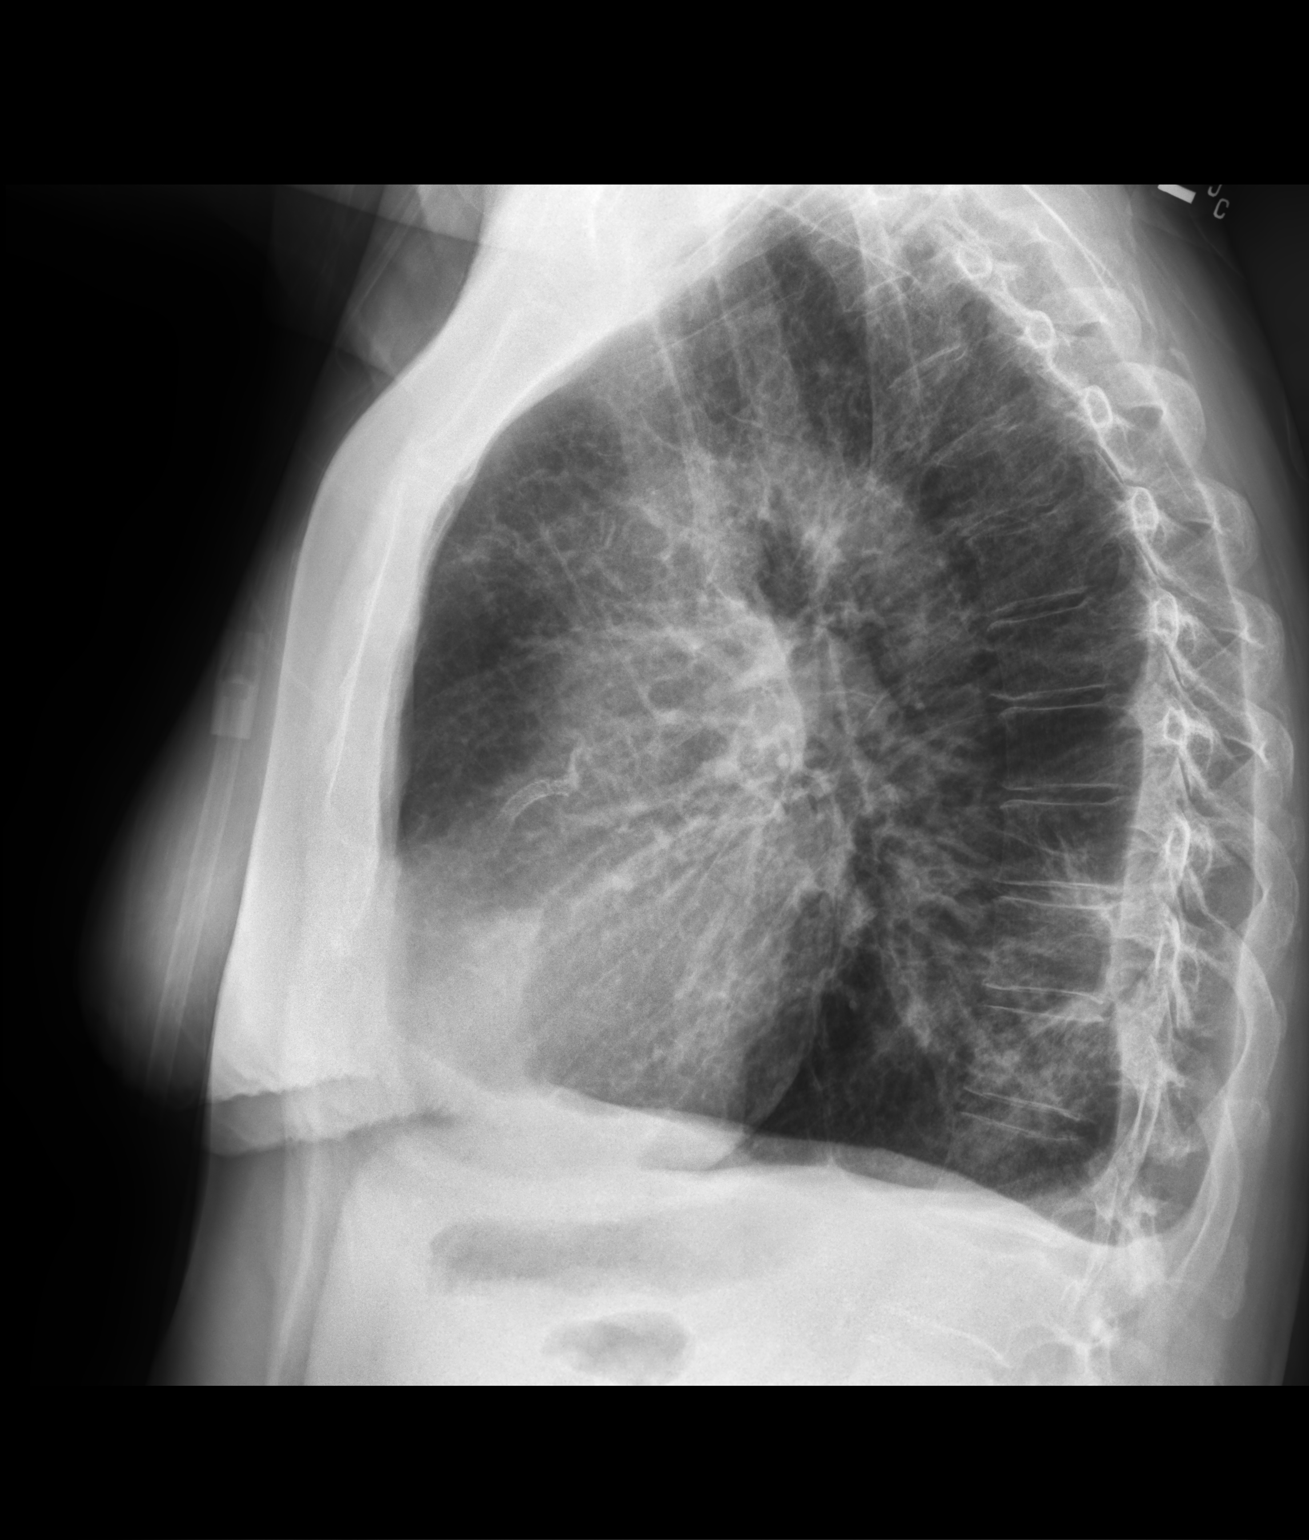

[2 of 2 positions shown; findings below may reference images not displayed]

FINDINGS: Normal cardiac size. Mild central pulmonary vascular congestion is
noted. No pneumothorax is noted. Mild bilateral pleural effusions
are noted. Mild left basilar subsegmental atelectasis or scarring is
noted. The visualized skeletal structures are unremarkable.
IMPRESSION: Mild left basilar subsegmental atelectasis or scarring. Mild central
pulmonary vascular congestion. Mild bilateral pleural effusions.

## 2020-07-27 IMAGING — DX DG CHEST 2V
2 series · 2 of 2 positions shown · non-contrast
Comparison: February 27, 2017

CLINICAL DATA: Hemoptysis.  History of COPD

EXAM:
CHEST - 2 VIEW

[chest pa]
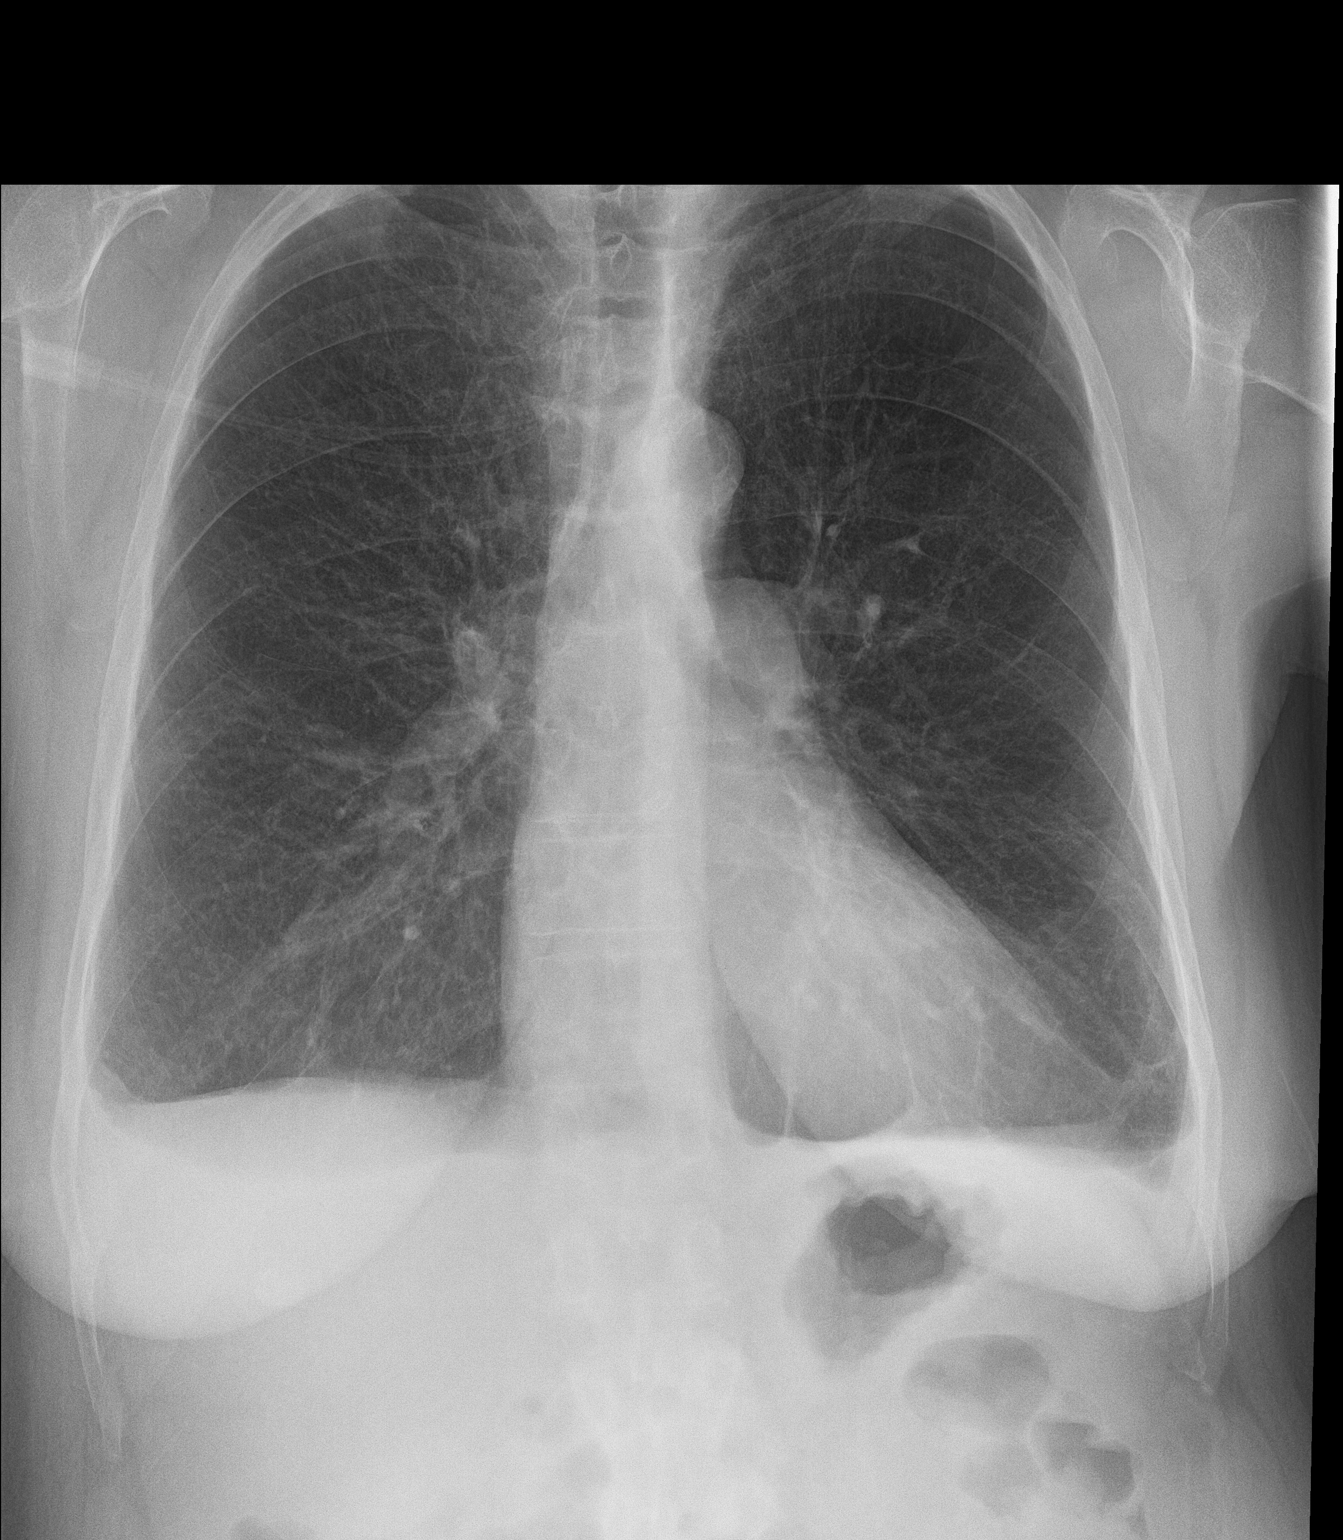

[chest lat]
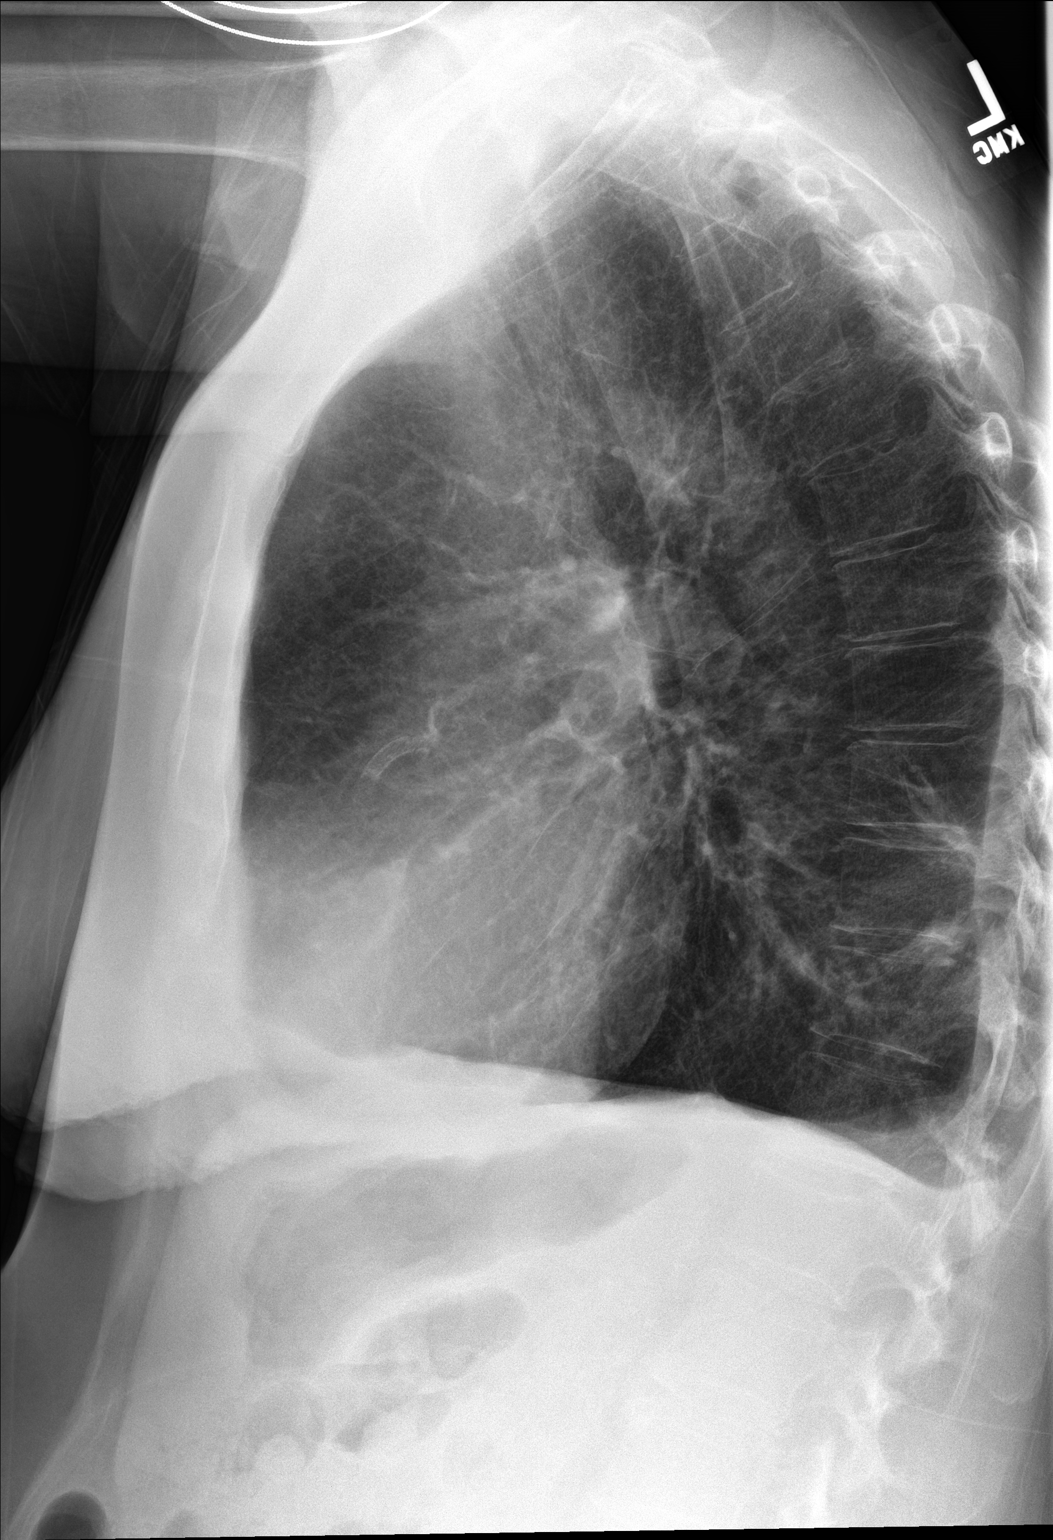

[2 of 2 positions shown; findings below may reference images not displayed]

FINDINGS: Lungs are hyperexpanded. There is mild scarring in the bases with
blunting of each costophrenic angle. There is no edema or
consolidation. The heart size is normal. There is diminished
vascularity bilaterally, particularly in the upper lobes, consistent
with underlying COPD. No adenopathy. There is an apparent coronary
stent in the left anterior descending coronary artery. There is
aortic atherosclerosis.
IMPRESSION: Changes of underlying COPD. Bibasilar scarring. No frank edema or
consolidation. There is a coronary artery stent. There is aortic
atherosclerosis.

Aortic Atherosclerosis (RK8E6-J21.1) and Emphysema (RK8E6-JX0.Q).

## 2021-03-30 IMAGING — CR PORTABLE CHEST - 1 VIEW
1 series · 1 of 1 positions shown · non-contrast
Comparison: February 15, 2018 chest radiograph; chest CT November 14, 2017.

CLINICAL DATA: Shortness of breath

EXAM:
PORTABLE CHEST 1 VIEW

[portable]
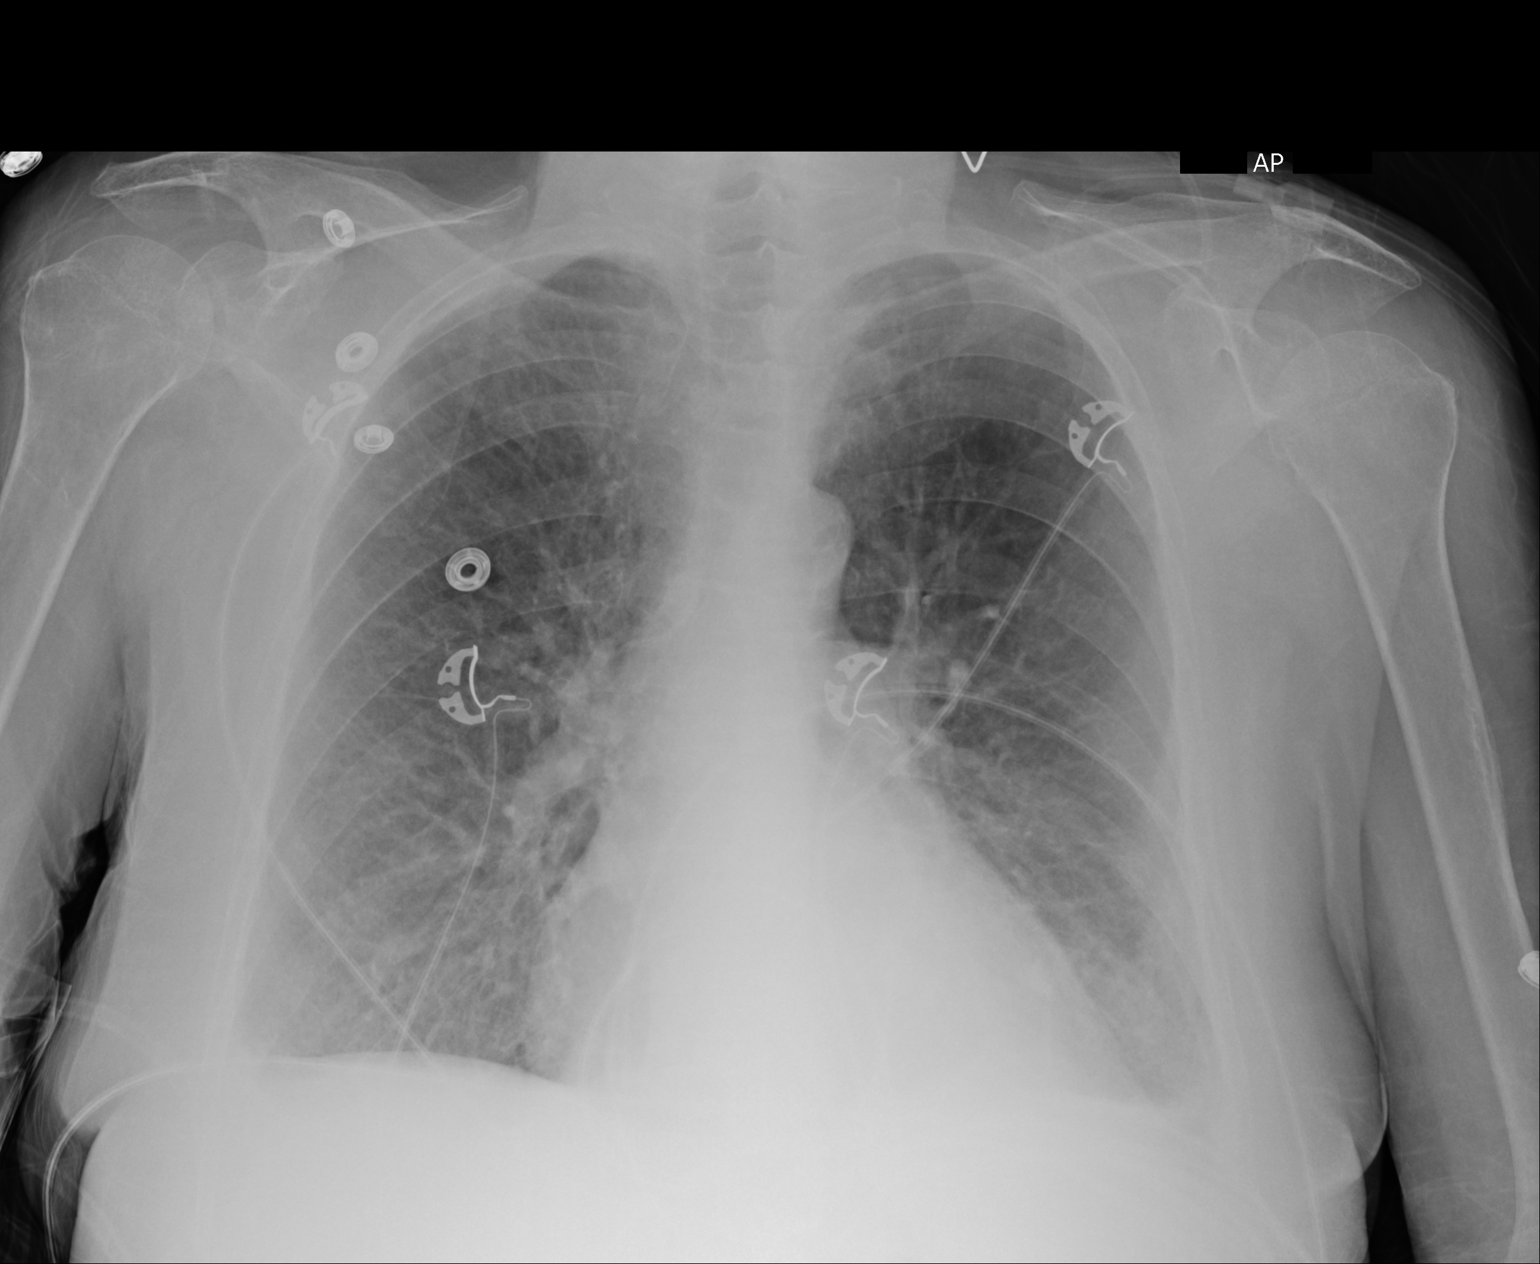

[1 of 1 positions shown; findings below may reference images not displayed]

FINDINGS: There is mild scarring in the left base. There remains interstitial
thickening bilaterally. There is no frank edema or consolidation.
Heart is slightly enlarged with pulmonary vascularity normal. No
adenopathy. No bone lesions.
IMPRESSION: Scarring left base. Interstitial thickening, stable. Underlying
emphysematous change better seen on prior CT. No edema or
consolidation. Stable cardiac prominence.

Emphysema ([id number]-PN7.0).
